# Patient Record
Sex: Female | Born: 1965 | Race: White | Hispanic: No | Marital: Married | Smoking: Never smoker
Health system: Southern US, Community
[De-identification: ages and names within clinical notes are randomized; demographics above are authoritative.]

## PROBLEM LIST (undated history)

## (undated) DIAGNOSIS — F329 Major depressive disorder, single episode, unspecified: Secondary | ICD-10-CM

## (undated) DIAGNOSIS — F429 Obsessive-compulsive disorder, unspecified: Secondary | ICD-10-CM

## (undated) DIAGNOSIS — R519 Headache, unspecified: Secondary | ICD-10-CM

## (undated) DIAGNOSIS — N814 Uterovaginal prolapse, unspecified: Secondary | ICD-10-CM

## (undated) DIAGNOSIS — IMO0002 Reserved for concepts with insufficient information to code with codable children: Secondary | ICD-10-CM

## (undated) DIAGNOSIS — M810 Age-related osteoporosis without current pathological fracture: Secondary | ICD-10-CM

## (undated) DIAGNOSIS — G809 Cerebral palsy, unspecified: Secondary | ICD-10-CM

## (undated) DIAGNOSIS — G709 Myoneural disorder, unspecified: Secondary | ICD-10-CM

## (undated) DIAGNOSIS — F32A Depression, unspecified: Secondary | ICD-10-CM

## (undated) DIAGNOSIS — F419 Anxiety disorder, unspecified: Secondary | ICD-10-CM

## (undated) DIAGNOSIS — R51 Headache: Secondary | ICD-10-CM

## (undated) DIAGNOSIS — C801 Malignant (primary) neoplasm, unspecified: Secondary | ICD-10-CM

## (undated) HISTORY — PX: FRACTURE SURGERY: SHX138

## (undated) HISTORY — DX: Reserved for concepts with insufficient information to code with codable children: IMO0002

## (undated) HISTORY — DX: Obsessive-compulsive disorder, unspecified: F42.9

## (undated) HISTORY — DX: Anxiety disorder, unspecified: F41.9

## (undated) HISTORY — PX: ELBOW SURGERY: SHX618

## (undated) HISTORY — DX: Age-related osteoporosis without current pathological fracture: M81.0

## (undated) HISTORY — DX: Uterovaginal prolapse, unspecified: N81.4

## (undated) HISTORY — DX: Cerebral palsy, unspecified: G80.9

---

## 2003-10-18 HISTORY — PX: WRIST SURGERY: SHX841

## 2004-03-15 ENCOUNTER — Emergency Department (HOSPITAL_COMMUNITY): Admission: EM | Admit: 2004-03-15 | Discharge: 2004-03-16 | Payer: Self-pay | Admitting: Emergency Medicine

## 2004-03-17 ENCOUNTER — Ambulatory Visit (HOSPITAL_BASED_OUTPATIENT_CLINIC_OR_DEPARTMENT_OTHER): Admission: RE | Admit: 2004-03-17 | Discharge: 2004-03-17 | Payer: Self-pay | Admitting: Orthopedic Surgery

## 2005-06-25 ENCOUNTER — Emergency Department (HOSPITAL_COMMUNITY): Admission: EM | Admit: 2005-06-25 | Discharge: 2005-06-25 | Payer: Self-pay | Admitting: Family Medicine

## 2007-01-30 ENCOUNTER — Ambulatory Visit (HOSPITAL_COMMUNITY): Admission: RE | Admit: 2007-01-30 | Discharge: 2007-01-30 | Payer: Self-pay | Admitting: Obstetrics and Gynecology

## 2007-04-09 ENCOUNTER — Inpatient Hospital Stay (HOSPITAL_COMMUNITY): Admission: AD | Admit: 2007-04-09 | Discharge: 2007-04-09 | Payer: Self-pay | Admitting: Obstetrics and Gynecology

## 2007-04-09 ENCOUNTER — Encounter: Payer: Self-pay | Admitting: Emergency Medicine

## 2007-04-18 ENCOUNTER — Inpatient Hospital Stay (HOSPITAL_COMMUNITY): Admission: AD | Admit: 2007-04-18 | Discharge: 2007-04-18 | Payer: Self-pay | Admitting: Obstetrics and Gynecology

## 2007-06-22 ENCOUNTER — Encounter (INDEPENDENT_AMBULATORY_CARE_PROVIDER_SITE_OTHER): Payer: Self-pay | Admitting: Obstetrics and Gynecology

## 2007-06-22 ENCOUNTER — Inpatient Hospital Stay (HOSPITAL_COMMUNITY): Admission: RE | Admit: 2007-06-22 | Discharge: 2007-06-26 | Payer: Self-pay | Admitting: Obstetrics and Gynecology

## 2009-10-17 DIAGNOSIS — IMO0002 Reserved for concepts with insufficient information to code with codable children: Secondary | ICD-10-CM

## 2009-10-17 DIAGNOSIS — R87619 Unspecified abnormal cytological findings in specimens from cervix uteri: Secondary | ICD-10-CM

## 2009-10-17 HISTORY — PX: COLPOSCOPY: SHX161

## 2009-10-17 HISTORY — DX: Reserved for concepts with insufficient information to code with codable children: IMO0002

## 2009-10-17 HISTORY — DX: Unspecified abnormal cytological findings in specimens from cervix uteri: R87.619

## 2010-03-04 ENCOUNTER — Encounter: Admission: RE | Admit: 2010-03-04 | Discharge: 2010-03-04 | Payer: Self-pay | Admitting: Allergy and Immunology

## 2010-11-06 ENCOUNTER — Encounter: Payer: Self-pay | Admitting: Family Medicine

## 2011-03-01 NOTE — H&P (Signed)
NAMEFRONNIE, URTON NO.:  [id number]   MEDICAL RECORD NO.:  [id number]          PATIENT TYPE:  INP   LOCATION:  9199                          FACILITY:  [facility]   PHYSICIAN:  Osborn Coho, M.D.   DATE OF BIRTH:  29-Nov-1965   DATE OF ADMISSION:  06/22/2007  DATE OF DISCHARGE:                              HISTORY & PHYSICAL   This is a 45 year old gravida 1, para 0 at 39-0/7 weeks who presents for  elective C-section secondary to musculoskeletal pain related to a broken  ankle.  She denies any leaking or bleeding and reports positive fetal  movement.  Pregnancy has been followed by the Physician's Service and  remarkable for:  1. AMA.  2. OCD with anxiety and depression.  3. Cerebral palsy on the right arm and leg.  4. Obesity.  5. Rh negative.   ALLERGIES:  None.   OBSTETRICAL HISTORY:  Patient is primigravida.   MEDICAL HISTORY:  Remarkable for childhood varicella, history of  migraines, history of depression, anxiety, OCD, and right-sided cerebral  palsy which causes frequent falls and recent fracture of her right  ankle.   PAST SURGICAL HISTORY:  Remarkable for a broken left wrist.   FAMILY HISTORY:  Remarkable for a mother with varicosities.  Grandmother  and grandfather with Alzheimer's, father with skin cancer.  Mother and  brother with psychiatric disorders.   GENETIC HISTORY:  Remarkable for patient with cerebral palsy on the  right side and father of the baby with a misformed aortic valve and  patient's age of 60.   SOCIAL HISTORY:  The patient is married to [employer] who is involved  and supportive.  She is of the [place] faith.  She works as a  Museum/gallery exhibitions officer, and she denies any alcohol, tobacco or drug use, although  she is on Luvox, Wellbutrin, and Vistaril for her mental health issues.   LABORATORY DATA:  Hemoglobin 13.2, platelets 323, blood type 0 negative.  Antibody screen negative, RPR nonreactive, rubella immune, hepatitis  negative, Pap test negative, gonorrhea and chlamydia negative.  Cystic  fibrosis negative.   HISTORY OF CURRENT PREGNANCY:  Patient entered care at 31 weeks'  gestation.  First trimester screen was normal.  She had an ultrasound at  16 weeks that was normal and echocardiogram at 20 weeks that was normal.  She restarted Wellbutrin at 20 weeks and added Luvox and Vistaril to  that at 24 weeks per [name].  She was treated for a UTI at 28 weeks.  Hemoglobin was 8.8 at 29 weeks, and she was placed on iron three times a  day.  Glucola was elevated, and she had a three hour GPT.  Results are  unavailable.  She broke her ankle at 29 weeks and was treated by  orthopedics for that.  She was retreated for a UTI at 31 weeks.  She  presents today for C-section.   PHYSICAL EXAMINATION:  HEENT:  Within normal limits.  NECK:  Thyroid not enlarged.  CHEST:  Clear to auscultation.  HEART:  Heart rate regular rate and rhythm.  ABDOMEN:  Gravid.  Fetal heart tones 150.  PELVIC:  Deferred.  EXTREMITIES:  Within normal limits except for pain in the right ankle  and cerebral palsy changes in the right leg.   ASSESSMENT:  1. Intrauterine pregnancy at 39-0/7 weeks.  2. Musculoskeletal issues.  3. Elective primary C-section.   PLAN:  Admit to OR per Dr. Su Hilt and further orders to follow.      Marie L. Williams, C.N.M.      Osborn Coho, M.D.  Electronically Signed    MLW/MEDQ  D:  06/22/2007  T:  06/22/2007  Job:  [id number]

## 2011-03-01 NOTE — Op Note (Signed)
NAMEJANESIA, Casey Diaz NO.:  [id number]   MEDICAL RECORD NO.:  [id number]          PATIENT TYPE:  INP   LOCATION:  9131                          [facility]ACILITY:  [facility]   PHYSICIAN:  Osborn Coho, M.D.   DATE OF BIRTH:  02/19/1966   DATE OF PROCEDURE:  06/22/2007  DATE OF DISCHARGE:                               OPERATIVE REPORT   PREOPERATIVE DIAGNOSES:  1. Thirty-nine weeks pregnant.  2. Breech presentation.  3. Osteoporosis.   POSTOPERATIVE DIAGNOSES:  1. Thirty-nine weeks pregnant.  2. Breech presentation.  3. Osteoporosis.   PROCEDURE:  Primary low transverse C-section.   ATTENDING:  Osborn Coho, M.D.   ASSISTANT:  Wynelle Bourgeois, C.N.M.   ANESTHESIA:  Epidural.   FINDINGS:  Live female infant, [name].  Apgars 5 at one minute and 7 at  five minutes.  Cord gases:  Venous pH 7.35 and arterial pH 7.36.  Fluids  1450 mL.   URINE OUTPUT:  200 mL.   ESTIMATED BLOOD LOSS:  1000 mL.  Placenta to pathology.   COMPLICATIONS:  None.   PROCEDURE:  The patient was taken to the operating room after the risks,  benefits and alternatives were reviewed with the patient and the patient  verbalized understanding and consent signed and witnessed.  The patient  was given an epidural and prepped and draped in the normal sterile  fashion in the supine position.  A Pfannenstiel skin incision was made,  and the incision was carried down to the underlying layer of fascia with  the Bovie.  The fascia was excised bilaterally in the midline and  extended bilaterally with the Mayo scissors.  Kocher clamps were placed  on the inferior aspect of the fascial incision and the rectus muscle  excised from the fascia.  The same was done on the superior aspect of  the fascial incision.  The rectus muscle was separated in the midline  and the peritoneum entered bluntly and extended manually.  Bladder blade  was placed and bladder flap created with the Metzenbaum scissors.  Uterine  incision was made with the scalpel and extended bilaterally with  the bandage scissors.  Infant was delivered in breech presentation and  cord clamped and cut, and the infant handed to the waiting  pediatricians.  The placenta was removed via fundal massage, and the  uterus was cleared of all clots and debris.  The uterine incision was  repaired with 0 Vicryl in a running locked fashion, and a second  imbricating layer was performed.  Copious irrigation of the intra-  abdominal cavity was performed, and bilateral ovaries and fallopian  tubes appeared to be within normal limits.  The peritoneum was repaired  with 2-0 chromic in a running fashion.  The fascia was repaired with 0  Vicryl in a running fashion.  The subcutaneous tissue was irrigated and  made hemostatic with the Bovie.  Two-0 plain was used in 3 interrupted  stitches to reapproximate the subcutaneous tissue.  The skin was  repaired with 3-0 Monocryl via a subcuticular stitch.  Steri-Strips were  applied.  Sponge, lap  and needle count was correct.  The patient  tolerated the procedure well and was awaiting transfer to the recovery  room in good condition.      Osborn Coho, M.D.  Electronically Signed     AR/MEDQ  D:  06/22/2007  T:  06/22/2007  Job:  [id number]

## 2011-03-01 NOTE — Discharge Summary (Signed)
NAMECHRISTIANE, SISTARE NO.:  [id number]   MEDICAL RECORD NO.:  [id number]          PATIENT TYPE:  INP   LOCATION:  9131                          [facility]ACILITY:  [facility]   PHYSICIAN:  Janine Limbo, M.D.DATE OF BIRTH:  01-26-1966   DATE OF ADMISSION:  06/22/2007  DATE OF DISCHARGE:  06/26/2007                               DISCHARGE SUMMARY   ADMITTING DIAGNOSES:  1. Intrauterine pregnancy at 39 weeks.  2. Scheduled for elective cesarean section secondary to      musculoskeletal pain related to broken ankle.  3. Obsessive compulsive disorder with anxiety and depression.  4. Cerebral palsy on her right arm and leg.  5. Advanced maternal age.   DISCHARGE DIAGNOSES:  1. Thirty-nine weeks pregnant.  2. Breech presentation.  3. Osteoporosis.  4. Musculoskeletal limitations.   PROCEDURES:  Primary low transverse cesarean section.   HOSPITAL COURSE:  Ms. Ibrahim is a 45 year old gravida 1, para 0, at 43  weeks, who presented for elective cesarean section secondary to  musculoskeletal pain related to a broken ankle and osteoporosis of the  right hip as well as breech presentation.  Pregnancy has been remarkable  for (1) Advanced maternal age.  (2) OCD with anxiety and depression.  (3) Cerebral palsy of the right arm and leg.  (4) Obesity.  (5) Rh  negative.  (6) Chronic pain for approximately 3 weeks secondary to  sequelae of a broken ankle on the right-hand side leading to left hip  osteoporosis and fluid.  The patient was taken to the operating room  where a primary low transverse cesarean section was performed by Dr.  Su Hilt.  Findings were a viable female by the name of [name].  Apgars were  5 and 7.  Cord gases were within normal limits.  The patient tolerated  the procedure well and was taken to recovery in good condition.  The  infant was taken initially to the full-term nursery but then was  transferred to NICU secondary to blood glucose instability.  The  patient, by postop day 1, was still on epidural anesthesia.  This is  controlling her pain at present; however, when she was switched to p.o.  pain medication, she did have significant issues with comfort.  Dilaudid  was begun p.o.; Percocet was discontinued.  Eventually she was placed on  a 6 mg dose, and this was found to be adequate.  She also was placed on  Robaxin for muscle spasm.  She also was eventually placed on clonazepam  for sleep and Ativan for anxiety and hypersensitivity to stimulation.  This demonstrated itself to be a good regimen.  PT consult was obtained,  and the recommendation was made for home PT and OT therapy.  Throughout  the rest of the hospital stay, the patient's healing progressed very  well. For her incision, she did have Steri-Strips and subcuticular  sutures noted.  There was negative Homans sign in both legs.  She was  having pain, though in her right hip which did limit her mobility.  The  patient did, on postop day 3, have  a fall from the bedside commode.  There was no exacerbation of her injury.  Social work consult was also  obtained for history of anxiety and depression.  By postop day 4, the  patient was more improved.  Physical therapy had just come in and made  the recommendation for home care.  They felt she was stable to be  discharged.  Her physical exam was within normal limits.  Her incision  was clean, dry, and intact.  She was doing well with her pain medication  regimen and other medications.  Dr. Stefano Gaul saw the patient, and she  was deemed to have received full benefit of her hospital stay and was  discharged home.   DISCHARGE INSTRUCTIONS:  Per [facility] handout.  PT and OT  therapy will also begin at home.   DISCHARGE MEDICATIONS:  1. Robaxin 100 mg p.o. q.i.d.  2. Temazepam 7.5 mg 1 p.o. q.h.s.  3. Lorazepam 1 mg p.o. t.i.d. p.r.n.  4. Dilaudid 6 mg p.o. q.4h. p.r.n. pain.  5. Motrin 600 mg p.o. q.6h. p.r.n.  6. The  patient is to continue her previously utilized medications of      Luvox, Wellbutrin, Flexeril if needed Xyzal and Nasalcrom.  These      have been previously prescribed by other physicians.   DISCHARGE FOLLOWUP:  1. PT and OT will see the patient as soon as that can be arranged      through [facility].  2. The patient will follow up with Dr. Haywood Lasso for management of her      anxiety and psychotropic medications.  3. The patient currently has a 5 week followup with her orthopedist,      but this will be the managers of her pain medication beyond the      postpartum time.  4. Discharge followup with [facility] will be at 6 weeks.      Renaldo Reel Emilee Hero, C.N.M.      Janine Limbo, M.D.  Electronically Signed    VLL/MEDQ  D:  06/26/2007  T:  06/26/2007  Job:  [id number]

## 2011-03-04 NOTE — Consult Note (Signed)
[name], Casey Diaz                            ACCOUNT NO.:  [id number]   MEDICAL RECORD NO.:  [id number]                   PATIENT TYPE:  EMS   LOCATION:  ED                                   [facility]ACILITY:  [facility]   PHYSICIAN:  Artist Pais. Mina Marble, M.D.           DATE OF BIRTH:  08/24/1966   DATE OF CONSULTATION:  03/15/2004  DATE OF DISCHARGE:                                   CONSULTATION   EMERGENCY ROOM CONSULTATION:   PHYSICIAN REQUESTING CONSULTATION:  Donnetta Hutching, M.D.   REASON FOR CONSULTATION:  Casey Diaz is a 45 year old left-hand dominant  female who fell down on her outstretched left hand earlier this evening. She  presents today with pain and deformity. She is an otherwise fairly healthy,  45 year old. She is left-hand dominant. She has cerebral palsy effecting her  right side. She is hemiplegic.   ALLERGIES:  She has no known drug allergies.   MEDICATIONS PRIOR TO ADMISSION:  She is currently taking Wellbutrin, birth  control pills, and temazepam.   PAST MEDICAL HISTORY:  No recent hospitalization or surgery.   FAMILY HISTORY:  Noncontributory.   SOCIAL HISTORY:  Noncontributory.   PHYSICAL EXAMINATION:  Today, a well-developed, well-nourished female,  pleasant, alert, __________.  EXAMINATION OF HER UPPER EXTREMITY:  On the left, she has obvious pain and  deformity and swelling. She can move her digits, intermittent numbness and  tingling in the median distribution. Skin is intact and no open injuries.  She has no pain in the shoulder or elbow.   X-RAYS:  Show a displaced fracture of the distal radius.   IMPRESSION:  We have a 45 year old female with a displaced distal radius  fracture on her dominant left side.   She was given 2% plain lidocaine, hematoma block, placed in the fingertrap  traction, closed reduction was performed. She was placed in a sugar tong  splint. Postoperative films showed good reduction in the AP plane but  continued dorsal angulation  on the lateral plane. She was discharged from  the emergency department to followup in my office tomorrow to be scheduled  for surgery. She was given Percocet for pain, instructions on compartment  syndrome, acute carpal tunnel, etc. To call my office immediately if any of  those symptoms arise. If not, will check her tomorrow at 2 o'clock on  03/16/2004.                                              Artist Pais Mina Marble, M.D.   MAW/MEDQ  D:  03/16/2004  T:  03/16/2004  Job:  [id number]   cc:   Donnetta Hutching, MD

## 2011-03-04 NOTE — Op Note (Signed)
Casey Diaz, Casey Diaz                            ACCOUNT NO.:  [id number]   MEDICAL RECORD NO.:  [id number]                   PATIENT TYPE:  AMB   LOCATION:  DSC                                  FACILITY:  [facility]   PHYSICIAN:  Matthew A. Mina Marble, M.D.           DATE OF BIRTH:  1966/08/24   DATE OF PROCEDURE:  03/17/2004  DATE OF DISCHARGE:                                 OPERATIVE REPORT   PREOPERATIVE DIAGNOSIS:  Displaced intra-articular fracture, distal radius,  left.   POSTOPERATIVE DIAGNOSIS:  Displaced intra-articular fracture, distal radius,  left.   PROCEDURE:  Open reduction and internal fixation of displaced intra-  articular fracture, distal radius, left, using DVR-A anatomic plate and  screws.   SURGEON:  Artist Pais. Mina Marble, M.D.   ASSISTANT:  Aura Fey. Bobbe Medico.   ANESTHESIA:  General.   TOURNIQUET TIME:  38 minutes.   No complication, no drains.   OPERATIVE REPORT:  The patient was taken to the operating room and after the  induction of adequate general anesthesia, the left upper extremity was  prepped and draped in the usual sterile fashion.  An Esmarch was used to  exsanguinate the limb.  The tourniquet was inflated to 250 mmHg.  At this  point in time a longitudinal incision was made over the palmar aspect of the  distal forearm and wrist area over the palpable border of the flexor carpi  radialis tendon.  The incision was taken down through the skin and  subcutaneous tissues.  The FCR tendon was identified, the sheath was  incised.  The FCR tendon was retracted to the midline, and the radial artery  was retracted to the lateral side.  This interval was developed.  Dissection  was carried down to the level of the pronator quadratus.  The pronator  quadratus was subperiosteally stripped off the distal radius at the fracture  site.  The fracture site was debrided of clot and open reduction was  performed.  The DVR plate was then fastened to the volar aspect of  the  distal radius using one single cortical screw in the slotted part of the  plate.  X-ray showed good reduction in both the AP, lateral, and oblique  view.  The remaining cortical screws were filled, followed by the distal  pegs, under direct and fluoroscopic vision.  Intraoperative x-ray showed  good reduction in both the AP and lateral plane.  The wound was thoroughly  irrigated.  It was then loosely closed in layers with 3-0 Vicryl on the  pronator quadratus repair and a 3-0 Prolene subcuticular stitch on the skin.  Steri-Strips, 4 x 4's, fluffs, and a volar splint were applied.  The patient  tolerated the procedure well and went to the recovery room in stable  fashion.  Artist Pais Mina Marble, M.D.    MAW/MEDQ  D:  03/17/2004  T:  03/17/2004  Job:  [id number]

## 2011-07-29 LAB — CBC
HCT: 31.8 — ABNORMAL LOW
MCHC: 34.8
MCV: 89.4
Platelets: 279
RBC: 4.16
RDW: 15.7 — ABNORMAL HIGH
RDW: 16 — ABNORMAL HIGH

## 2011-07-29 LAB — CCBB MATERNAL DONOR DRAW

## 2011-07-29 LAB — RPR: RPR Ser Ql: NONREACTIVE

## 2011-07-29 LAB — RH IMMUNE GLOB WKUP(>/=20WKS)(NOT WOMEN'S HOSP)

## 2011-08-02 LAB — RH IMMUNE GLOBULIN WORKUP (NOT WOMEN'S HOSP): Antibody Screen: NEGATIVE

## 2011-10-18 HISTORY — PX: HAMMER TOE SURGERY: SHX385

## 2012-03-30 ENCOUNTER — Telehealth: Payer: Self-pay | Admitting: Obstetrics and Gynecology

## 2012-03-30 NOTE — Telephone Encounter (Signed)
Spoke with pt rgd msg. Pt stated she thinks she might have a yeast infection . Advised pt to try toc meds. Made an app with vicki on 04/05/12 @ 9:45. Pt's voice understanding . BT CMA

## 2012-03-30 NOTE — Telephone Encounter (Signed)
Triage/cht received 

## 2012-04-05 ENCOUNTER — Encounter: Payer: Self-pay | Admitting: Obstetrics and Gynecology

## 2012-06-03 ENCOUNTER — Other Ambulatory Visit: Payer: Self-pay | Admitting: Obstetrics and Gynecology

## 2012-07-10 ENCOUNTER — Telehealth: Payer: Self-pay

## 2012-07-10 NOTE — Telephone Encounter (Signed)
Called [facility] pharmacy to let them know that they can replace LoEstrin 24 FE to Minastrin 24 FE. Per protocol. JO, CMA

## 2012-07-27 ENCOUNTER — Encounter: Payer: Self-pay | Admitting: Obstetrics and Gynecology

## 2012-07-27 ENCOUNTER — Ambulatory Visit (INDEPENDENT_AMBULATORY_CARE_PROVIDER_SITE_OTHER): Payer: 59 | Admitting: Obstetrics and Gynecology

## 2012-07-27 VITALS — BP 114/80 | HR 82 | Ht 67.0 in | Wt 216.0 lb

## 2012-07-27 DIAGNOSIS — Z01419 Encounter for gynecological examination (general) (routine) without abnormal findings: Secondary | ICD-10-CM

## 2012-07-27 DIAGNOSIS — Z124 Encounter for screening for malignant neoplasm of cervix: Secondary | ICD-10-CM

## 2012-07-27 DIAGNOSIS — R350 Frequency of micturition: Secondary | ICD-10-CM

## 2012-07-27 LAB — POCT URINALYSIS DIPSTICK
Leukocytes, UA: NEGATIVE
Nitrite, UA: NEGATIVE
Protein, UA: NEGATIVE
pH, UA: 5

## 2012-07-27 MED ORDER — NORETHIN ACE-ETH ESTRAD-FE 1-20 MG-MCG(24) PO TABS: 1.0000 | ORAL_TABLET | Freq: Every day | ORAL | Status: DC

## 2012-07-27 MED ORDER — NORETHIN ACE-ETH ESTRAD-FE 1-20 MG-MCG(24) PO CHEW: 1.0000 | CHEWABLE_TABLET | Freq: Every day | ORAL | Status: DC

## 2012-07-27 NOTE — Progress Notes (Signed)
Regular Periods: no Mammogram: yes  Monthly Breast Ex.: yes Exercise: yes  Tetanus < 10 years: yes Seatbelts: yes  NI. Bladder Functn.: yes Abuse at home: no  Daily BM's: yes Stressful Work: yes  Healthy Diet: yes Sigmoid-Colonoscopy: NO  Calcium: no Medical problems this year: FREQUENT URINATION    LAST PAP:8/12  NL  Contraception: LOESTRIN 24   Mammogram:  10/13  NL  PCP: NO  PMH: NO CHANGE  FMH: NO CHANGE  Last Bone Scan: NO  PT IS MARRIED

## 2012-07-27 NOTE — Progress Notes (Signed)
Subjective:    Casey Diaz is a 46 y.o. female, G1P1, who presents for an annual exam. The patient reports urinary frequency but no dysuria, fever, or flank pain. Goes on to report occasional chills/hot flashes and more anxiety.  Denies sleep issues, suicidal or homicidal ideations.  Admits to extended family issues and states that her father is dying from lung cancer.  Menstrual cycle:   LMP: Patient's last menstrual period was 07/27/2012.             Review of Systems Pertinent items are noted in HPI. Denies pelvic pain, urinary tract symptoms, vaginitis symptoms, irregular bleeding, menopausal symptoms, change in bowel habits or rectal bleeding   Objective:    BP 114/80  Pulse 82  Ht 5\' 7"  (1.702 m)  Wt 216 lb (97.977 kg)  BMI 33.83 kg/m2  LMP 07/27/2012   Wt Readings from Last 1 Encounters:  07/27/12 216 lb (97.977 kg)   Body mass index is 33.83 kg/(m^2). General Appearance: Alert, no acute distress HEENT: Grossly normal Neck / Thyroid: Supple, no thyromegaly or cervical adenopathy Lungs: Clear to auscultation bilaterally Back: No CVA tenderness Breast Exam: No masses or nodes.No dimpling, nipple retraction or discharge. Cardiovascular: Regular rate and rhythm.  Gastrointestinal: Soft, non-tender, no masses or organomegaly Pelvic Exam: EGBUS-wnl, vagina-normal rugae, cervix- without lesions or tenderness, uterus appears normal size shape and consistency, adnexae-no masses or tenderness Rectovaginal: no masses and normal sphincter tone Lymphatic Exam: Non-palpable nodes in neck, clavicular,  axillary, or inguinal regions  Skin: no rashes or abnormalities Extremities: no clubbing cyanosis or edema  Neurologic: grossly normal Psychiatric: Alert and oriented  Urinalysis: negative    Assessment:   Routine GYN Exam Personal Stressors   Plan:  Continue Loestrin 24/Minastrin24  #1  1po qd 11 refills  PAP sent  RTO 1 year or prn  Charlyn Vialpando,ELMIRAPA-C

## 2012-07-30 LAB — PAP IG W/ RFLX HPV ASCU

## 2012-08-07 ENCOUNTER — Telehealth: Payer: Self-pay | Admitting: Obstetrics and Gynecology

## 2012-08-08 ENCOUNTER — Other Ambulatory Visit: Payer: Self-pay | Admitting: Obstetrics and Gynecology

## 2012-08-08 MED ORDER — HYDROCORTISONE 2.5 % RE CREA: TOPICAL_CREAM | Freq: Two times a day (BID) | RECTAL | Status: AC

## 2012-08-08 NOTE — Telephone Encounter (Signed)
Pt wants rx for hemorrhoids

## 2012-08-13 ENCOUNTER — Telehealth: Payer: Self-pay | Admitting: Obstetrics and Gynecology

## 2012-08-13 NOTE — Telephone Encounter (Signed)
VM FROM PT 08/10/12 08/10/12.  States having problem with hemorrhoids and wants RX. Called a few days ago.  Per chart, Rx E-scribed 08/08/12. TC to pt. LM to return call.

## 2012-08-15 NOTE — Telephone Encounter (Signed)
Encounter complete. 

## 2012-11-22 ENCOUNTER — Other Ambulatory Visit: Payer: Self-pay | Admitting: Obstetrics and Gynecology

## 2012-12-20 ENCOUNTER — Other Ambulatory Visit: Payer: Self-pay | Admitting: Obstetrics and Gynecology

## 2013-10-17 HISTORY — PX: ELBOW SURGERY: SHX618

## 2014-04-10 ENCOUNTER — Other Ambulatory Visit: Payer: Self-pay | Admitting: Internal Medicine

## 2014-04-10 DIAGNOSIS — R319 Hematuria, unspecified: Secondary | ICD-10-CM

## 2014-04-14 ENCOUNTER — Other Ambulatory Visit: Payer: Self-pay

## 2014-04-16 ENCOUNTER — Other Ambulatory Visit: Payer: Self-pay | Admitting: Internal Medicine

## 2014-04-16 ENCOUNTER — Inpatient Hospital Stay: Admission: RE | Admit: 2014-04-16 | Payer: Self-pay | Source: Ambulatory Visit

## 2014-04-16 ENCOUNTER — Encounter (INDEPENDENT_AMBULATORY_CARE_PROVIDER_SITE_OTHER): Payer: Self-pay

## 2014-04-16 ENCOUNTER — Ambulatory Visit
Admission: RE | Admit: 2014-04-16 | Discharge: 2014-04-16 | Disposition: A | Discharge status: Home / Self Care | Payer: 59 | Source: Ambulatory Visit | Attending: Internal Medicine | Admitting: Internal Medicine

## 2014-04-16 DIAGNOSIS — R319 Hematuria, unspecified: Secondary | ICD-10-CM

## 2014-08-18 ENCOUNTER — Encounter: Payer: Self-pay | Admitting: Obstetrics and Gynecology

## 2015-04-03 ENCOUNTER — Telehealth: Payer: Self-pay | Admitting: *Deleted

## 2015-04-03 NOTE — Telephone Encounter (Signed)
Pt states she ordered a lift for her hammer toes from Dr. Stacie Acres 2 weeks ago, and is checking status of order.

## 2015-04-17 ENCOUNTER — Ambulatory Visit (INDEPENDENT_AMBULATORY_CARE_PROVIDER_SITE_OTHER): Payer: 59 | Admitting: Podiatry

## 2015-04-17 VITALS — BP 107/72 | HR 89

## 2015-04-17 DIAGNOSIS — M205X1 Other deformities of toe(s) (acquired), right foot: Secondary | ICD-10-CM

## 2015-04-17 DIAGNOSIS — M2041 Other hammer toe(s) (acquired), right foot: Secondary | ICD-10-CM

## 2015-04-17 NOTE — Progress Notes (Signed)
Patient ID: Casey Diaz, female   DOB: April 25, 1966, 49 y.o.   MRN: [id number] I want to discuss the pain I am still having in my toes   This patient returns to my office having pain in the tips of her third and fourth toes right foot.  She is greatly concerned about her pain.  She previoiusly haD SURGERY PERFORMED BY MYSELF FOR 2,3,4 FUSIONS WITH k-WIRES.  She has ms on her left side and her pins did not stay in due to her poor bone stock.  She has now developed pain in her toes.  She was treated with crest pad which helps but her pain remains.  She desires permanent correction of this problem.Objective: Review of past medical history, medications, social history and allergies were performed.  Vascular: Dorsalis pedis and posterior tibial pulses were palpable B/L, capillary refill was  WNL B/L, temperature gradient was WNL B Skin  Distal clavi third toe right foot Nails: appear healthy with no signs of mycosis or infections  Sensory: Semmes Weinstein monifilament WNL   Orthopedic: Orthopedic evaluation demonstrates all joints distal t ankle have full ROM without crepitus, muscle power WNL B/L.  Mallet toe deformity 3,4 right foot  A.  Mallet toe 3,4 right foot  P.  ROV.  Discussed surgery with patient and she desires correction for 3,4 toes right foot.  To refer to Dr. Charlsie Merlesegal hoping he can perform in office surgery.

## 2015-04-23 ENCOUNTER — Telehealth: Payer: Self-pay | Admitting: *Deleted

## 2015-04-23 ENCOUNTER — Encounter: Payer: Self-pay | Admitting: Podiatry

## 2015-04-23 ENCOUNTER — Ambulatory Visit (INDEPENDENT_AMBULATORY_CARE_PROVIDER_SITE_OTHER): Payer: 59 | Admitting: Podiatry

## 2015-04-23 VITALS — BP 100/68 | HR 85 | Resp 15

## 2015-04-23 DIAGNOSIS — M2041 Other hammer toe(s) (acquired), right foot: Secondary | ICD-10-CM

## 2015-04-23 DIAGNOSIS — M205X1 Other deformities of toe(s) (acquired), right foot: Secondary | ICD-10-CM

## 2015-04-23 NOTE — Progress Notes (Signed)
Subjective:     Patient ID: Casey GriffithsJoy E Panuco, female   DOB: [birth year], 49 y.o.   MRN: [id number]  HPI patient presents stating I have had previous digital surgery on my right foot and I have a history of cerebral palsy and I did distal contraction of my toes with callus formation. Also my foot in general gets tired and painful and I wanted to know if a brace would be appropriate for me   Review of Systems     Objective:   Physical Exam Neurovascular status intact muscle strength adequate with distal contracture of the third fourth and second toes right with keratotic lesion mostly on the third and fourth toe and slightly on the second toe. Painful when pressed with also some depression of the arch secondary to see P deformity    Assessment:     Hammertoe deformity of the second third and fourth toes right with distal contracture and foot structural issues leading to tendinitis-like symptoms    Plan:     Reviewed conditions and discussed treatment options. Patient wants to have these toes fixed and is spoken to Dr. Stacie AcresMayer concerning the procedures and at this point distal arthroplasty digits 234 of the right foot have been recommended. This will be done under local and aesthetic and she does not want any form of sedation and they will be done at the surgical center. Patient at this time was given consent form for digital procedures digits 234 and I explained alternative treatments and complications associated with these procedures. Patient wants surgery signed consent form after extensive review and is scheduled for outpatient surgery and I also discussed brace therapy which can be done post operatively when the toes have healed. Patient will call to schedule surgery and was given preoperative instructions

## 2015-04-23 NOTE — Telephone Encounter (Signed)
"  I saw Dr. Charlsie Merlesegal this morning.  I want to schedule surgery for Tuesday of next week."  We may not be able to get it authorized by Tuesday.  [facility]  I will attempt to get it.  If I can't, I'll give you a call.  "Will it help if I called insurance company?  I'm having a lot of pain.  I was a patient of Dr. Stacie AcresMayer before and he had been treating me for the same problem."  No, I don't think it's needed.  I'll let you know.

## 2015-04-27 NOTE — Telephone Encounter (Signed)
"  I'm calling to see if you have heard anything regarding my insurance authorization for surgery."  I checked this morning, it's still pending.  I was going to check again this afternoon and give you a call.  "If I can't do it tomorrow, what's the next available date?"  I can try and get it rescheduled to 08/03 at lunch time.  "Okay that sounds good.  Will you let me know either way?"  Yes, I will let you know.

## 2015-04-27 NOTE — Telephone Encounter (Signed)
I'm calling to let you know it's still pending with insurance.  I can check again first thing in the morning if you like.  They have you scheduled as the last case.  "That will be fine.  If not, it will be scheduled for August 3, correct?"  Yes, it will be scheduled for August 3.

## 2015-04-28 NOTE — Telephone Encounter (Signed)
I left patient a message that I was not able to get authorization for surgery.  It is still pending.  I have been waiting on hold for 1 hour.  They're going to e-mail me a response when it's authorized.  It looks like we may have to reschedule to August 3.  Dr. Charlsie Merlesegal doesn't have anything available on 08/02.  Call if you have any questions or concerns.

## 2015-04-28 NOTE — Telephone Encounter (Signed)
I got authorization for patient's surgery scheduled for 05/20/2015.  Authorization number is [id number].  [facility]    I'm calling to inform you that surgery was authorized.  "So I'm good for 05/20/2015?"  Yes, you are good for that date.  "What time am I supposed to be there?"  It is a lunch time case.  Surgical center will call you a day or two prior to date and give you arrival time.  Tentative time is 11am.

## 2015-05-04 ENCOUNTER — Other Ambulatory Visit: Payer: Self-pay

## 2015-05-05 ENCOUNTER — Encounter: Payer: Self-pay | Admitting: Podiatry

## 2015-05-12 ENCOUNTER — Encounter: Payer: Self-pay | Admitting: Podiatry

## 2015-05-18 ENCOUNTER — Telehealth: Payer: Self-pay | Admitting: *Deleted

## 2015-05-18 NOTE — Telephone Encounter (Signed)
Per Dr. Charlsie Merles, I called to see if patient could reschedule her surgery from 05/20/2015 to 05/19/2015.  "I cannot move it.  I already have things lined up to do tomorrow that can't be moved."  Okay that is fine.  "Will the surgical center call me tomorrow and what time will my surgery be.?"  Yes, they should give you a call tomorrow.  Surgery will be around lunch time.  They will tell you what time to arrive.  "Okay, thanks for calling."

## 2015-05-20 ENCOUNTER — Encounter: Payer: Self-pay | Admitting: Podiatry

## 2015-05-20 DIAGNOSIS — M2041 Other hammer toe(s) (acquired), right foot: Secondary | ICD-10-CM | POA: Diagnosis not present

## 2015-05-22 ENCOUNTER — Telehealth: Payer: Self-pay | Admitting: *Deleted

## 2015-05-22 MED ORDER — DIAZEPAM 5 MG PO TABS: 5.0000 mg | ORAL_TABLET | Freq: Three times a day (TID) | ORAL | Status: DC | PRN

## 2015-05-22 MED ORDER — CYCLOBENZAPRINE HCL 10 MG PO TABS: 10.0000 mg | ORAL_TABLET | Freq: Three times a day (TID) | ORAL | Status: DC | PRN

## 2015-05-22 NOTE — Telephone Encounter (Signed)
Pt states she is in severe pain, and stiffens up when walking.  I informed pt the pain is not unusual at this point post-op and should decrease from this point.  I instructed pt to remove ace only and elevate surgical foot 15 min then rewrap the ace looser, apply ice and if can tolerate Ibuprofen OTC take as package instructs.  Pt requested muscle relaxer, so she can not be so stiff when walking.  Dr. Charlsie Merles ordered Valium  #30 1 tablet tid.  Ordered and informed pt, the rx would need to be picked up in the [place] office, pt states she'll send her brother.

## 2015-05-22 NOTE — Telephone Encounter (Addendum)
Julian Hy states pt is also on Lorazepam from Dr. Haywood Lasso last filled 05/15/2015 30 day supply.  Dr. Charlsie Merles cancelled the Valium and ordered Flexeril  #20 1 tablet bid.  I cancelled the Valium with Rosey Bath [phone] at 153pm and ordered Flexeril. Pt asked if her husband needed to come by the office for the Valium rx or go to the [facility] on [address] for Flexeril.  I left the message to pick up the Flexeril at the [facility].

## 2015-05-26 NOTE — Progress Notes (Signed)
DOS 05/20/2015 hammer toe repair with removal bone distal 2,3,4 right foot.

## 2015-05-27 ENCOUNTER — Encounter: Payer: 59 | Admitting: Podiatry

## 2015-05-28 ENCOUNTER — Encounter: Payer: Self-pay | Admitting: Podiatry

## 2015-05-28 ENCOUNTER — Ambulatory Visit (INDEPENDENT_AMBULATORY_CARE_PROVIDER_SITE_OTHER): Payer: 59 | Admitting: Podiatry

## 2015-05-28 ENCOUNTER — Ambulatory Visit (INDEPENDENT_AMBULATORY_CARE_PROVIDER_SITE_OTHER): Payer: 59

## 2015-05-28 ENCOUNTER — Ambulatory Visit (HOSPITAL_COMMUNITY): Payer: Self-pay | Admitting: Psychiatry

## 2015-05-28 VITALS — BP 100/74 | HR 100 | Resp 18

## 2015-05-28 DIAGNOSIS — M2041 Other hammer toe(s) (acquired), right foot: Secondary | ICD-10-CM

## 2015-05-28 DIAGNOSIS — M205X1 Other deformities of toe(s) (acquired), right foot: Secondary | ICD-10-CM

## 2015-05-28 DIAGNOSIS — Z9889 Other specified postprocedural states: Secondary | ICD-10-CM

## 2015-05-28 NOTE — Progress Notes (Signed)
Subjective:     Patient ID: Casey Diaz, female   DOB: Aug 13, 1966, 49 y.o.   MRN: [id number]  HPI patient states she is doing fine with her toes with minimal discomfort   Review of Systems     Objective:   Physical Exam Neurovascular status intact muscle strength adequate patient's noted to have negative Homans sign and has good alignment of the second third and fourth toes post digital distal arthroplasty. Wound edges are well coapted with no drainage    Assessment:     Doing well post arthroplasty digits 234 of the right foot    Plan:     Reviewed x-rays with patient and reapplied dressings and advised on continued elevation and open toed shoes. Reappoint 2 weeks for suture removal or earlier if needed

## 2015-06-03 ENCOUNTER — Encounter (INDEPENDENT_AMBULATORY_CARE_PROVIDER_SITE_OTHER): Payer: Self-pay

## 2015-06-03 ENCOUNTER — Ambulatory Visit (INDEPENDENT_AMBULATORY_CARE_PROVIDER_SITE_OTHER): Payer: 59 | Admitting: Psychiatry

## 2015-06-03 ENCOUNTER — Encounter (HOSPITAL_COMMUNITY): Payer: Self-pay | Admitting: Psychiatry

## 2015-06-03 VITALS — BP 96/66 | HR 90 | Ht 67.0 in | Wt 191.2 lb

## 2015-06-03 DIAGNOSIS — F42 Obsessive-compulsive disorder: Secondary | ICD-10-CM | POA: Diagnosis not present

## 2015-06-03 DIAGNOSIS — F332 Major depressive disorder, recurrent severe without psychotic features: Secondary | ICD-10-CM | POA: Diagnosis not present

## 2015-06-03 DIAGNOSIS — F429 Obsessive-compulsive disorder, unspecified: Secondary | ICD-10-CM | POA: Insufficient documentation

## 2015-06-03 NOTE — Progress Notes (Signed)
Psychiatric Initial Adult Assessment   Patient Identification: Casey Diaz MRN:  [id number] Date of Evaluation:  06/03/2015 Referral Source: Dr [name] Chief Complaint:depression not adequately responsive to medication and therapy   Visit Diagnosis:    ICD-9-CM ICD-10-CM   1. Severe recurrent major depression without psychotic features 296.33 F33.2   2. Obsessive compulsive disorder 300.3 F42    Diagnosis:   Patient Active Problem List   Diagnosis Date Noted  . Severe recurrent major depression without psychotic features [F33.2] 06/03/2015    Priority: Medium    Class: Chronic  . Obsessive compulsive disorder [F42] 06/03/2015    Priority: Medium    Class: Chronic   History of Present Illness:  Casey Diaz has been diagnosed with depression since aged 48 she says.  She has tried about all the antidepressants she believes but none have relieved the depression completely.  She currently takes Luvox 450 mg daily and is still depressed severely.  This current round of depression has lasted for 2 years and has gotten worse in the last year even with treatment.  She believes the death of her father was the precipitant as well as not working which she normally finds a good outlet.  She is staying home more to be there for her 13 year old son who will be entering the second grade and she believes may have ADHD.  She is also stressed over her mother's house in foreclosure and her mother, sister and brother may have nowhere to go in December.  Her OCD is one of intrusive thoughts that she has done something harmful to someone else and she has to check and re check. Elements:  Location:  depression. Quality:  daily loss of energy and sadness. Severity:  cannot feel happy. Timing:  death of father, mother about to lose her house, not being able to find a part time job to keep herself busy. Duration:  2 years this episode. Context:  as above. Associated Signs/Symptoms: Depression Symptoms:  depressed  mood, anhedonia, insomnia, fatigue, feelings of worthlessness/guilt, difficulty concentrating, impaired memory, anxiety, (Hypo) Manic Symptoms:  none Anxiety Symptoms:  Excessive Worry, Psychotic Symptoms:  none PTSD Symptoms: Negative  Past Medical History:  Past Medical History  Diagnosis Date  . Cystocele   . Uterine prolaps   . Osteoporosis   . Cerebral palsy     right arm/leg  . Anxiety   . OCD (obsessive compulsive disorder)   . Abnormal Pap smear 2011    hpv/mild dysplasia,cin1    Past Surgical History  Procedure Laterality Date  . Colposcopy  2011  . Hammer toe surgery  1/13    RIGHT SIDE   Family History:  Family History  Problem Relation Age of Onset  . Cancer Father     skin AND LUNG  . Alcohol abuse Sister     CRACK COCAINE   Social History:   Social History   Social History  . Marital Status: Married    Spouse Name: N/A  . Number of Children: N/A  . Years of Education: N/A   Social History Main Topics  . Smoking status: Never Smoker   . Smokeless tobacco: Never Used  . Alcohol Use: No     Comment: OCCASIONAL  . Drug Use: No  . Sexual Activity: Yes    Birth Control/ Protection: Pill     Comment: LOESTRIN 24 FE   Other Topics Concern  . None   Social History Narrative   Additional Social History: Does  have cerebral palsy  Musculoskeletal: Strength & Muscle Tone: right arm and leg affected by cerebral palsy and foot injury Gait & Station: broad based Patient leans: N/A  Psychiatric Specialty Exam: HPI  ROS  Blood pressure 96/66, pulse 90, height 5\' 7"  (1.702 m), weight 191 lb 3.2 oz (86.728 kg).Body mass index is 29.94 kg/(m^2).  General Appearance: Well Groomed  Eye Contact:  Good  Speech:  Clear and Coherent  Volume:  Normal  Mood:  Depressed  Affect:  Congruent  Thought Process:  Coherent and Logical  Orientation:  Full (Time, Place, and Person)  Thought Content:  Negative  Suicidal Thoughts:  No  Homicidal Thoughts:   No  Memory:  Immediate;   Good Recent;   Good Remote;   Good  Judgement:  Good  Insight:  Good  Psychomotor Activity:  Negative  Concentration:  Good  Recall:  Good  Fund of Knowledge:Good  Language: Good  Akathisia:  Negative  Handed:  Right  AIMS (if indicated):  0  Assets:  Communication Skills Desire for Improvement Financial Resources/Insurance Housing Intimacy Leisure Time Physical Health Resilience Social Support Talents/Skills Transportation Vocational/Educational  ADL's:  Intact  Cognition: WNL  Sleep:  poor   Is the patient at risk to self?  No. Has the patient been a risk to self in the past 6 months?  No. Has the patient been a risk to self within the distant past?  No. Is the patient a risk to others?  No. Has the patient been a risk to others in the past 6 months?  No. Has the patient been a risk to others within the distant past?  No.  Allergies:   Allergies  Allergen Reactions  . Hydrocodone Itching  . Bactrim [Sulfamethoxazole-Trimethoprim]   . Dust Mite Extract   . Other     PT IS ALLERGIC TO CAT DANDER AND RAGWEED  . Pollen Extract    Current Medications: Current Outpatient Prescriptions  Medication Sig Dispense Refill  . baclofen (LIORESAL) 10 MG tablet     . buPROPion (WELLBUTRIN XL) 150 MG 24 hr tablet     . cetirizine (ZYRTEC) 10 MG tablet Take 10 mg by mouth daily.    . cyclobenzaprine (FLEXERIL) 10 MG tablet Take 1 tablet (10 mg total) by mouth 3 (three) times daily as needed for muscle spasms. 20 tablet 0  . fluticasone (FLONASE) 50 MCG/ACT nasal spray Place 2 sprays into the nose daily.    . fluvoxaMINE (LUVOX) 100 MG tablet Take 100 mg by mouth at bedtime.    Marland Kitchen HYDROcodone-acetaminophen (NORCO) 10-325 MG per tablet Take 1 tablet by mouth every 4 (four) hours as needed.    . hydrocortisone (ANUSOL-HC) 2.5 % rectal cream Place rectally 2 (two) times daily. x 7-14 days 30 g 0  . LORazepam (ATIVAN) 1 MG tablet     . meperidine  (DEMEROL) 50 MG tablet Take 50 mg by mouth every 4 (four) hours as needed for severe pain.    . montelukast (SINGULAIR) 10 MG tablet     . nitrofurantoin (MACRODANTIN) 100 MG capsule     . Norethindrone Acetate-Ethinyl Estrad-FE (LOESTRIN 24 FE) 1-20 MG-MCG(24) tablet Take 1 tablet by mouth daily. 1 Package 11  . promethazine (PHENERGAN) 25 MG tablet Take 25 mg by mouth every 4 (four) hours as needed for nausea or vomiting.    . temazepam (RESTORIL) 30 MG capsule Take 30 mg by mouth at bedtime as needed.    . venlafaxine XR (EFFEXOR-XR)  150 MG 24 hr capsule Take 150 mg by mouth daily.     No current facility-administered medications for this visit.    Previous Psychotropic Medications: Yes   Substance Abuse History in the last 12 months:  No.  Consequences of Substance Abuse: Negative  Medical Decision Making:  Established Problem, Worsening (2)  Treatment Plan Summary: qualifies for TMS    Carolanne Grumbling 8/17/20162:18 PM

## 2015-06-04 ENCOUNTER — Ambulatory Visit (INDEPENDENT_AMBULATORY_CARE_PROVIDER_SITE_OTHER): Payer: 59 | Admitting: Podiatry

## 2015-06-04 ENCOUNTER — Encounter: Payer: Self-pay | Admitting: Podiatry

## 2015-06-04 VITALS — BP 94/64 | HR 94 | Resp 16

## 2015-06-04 DIAGNOSIS — M2041 Other hammer toe(s) (acquired), right foot: Secondary | ICD-10-CM

## 2015-06-04 DIAGNOSIS — M205X1 Other deformities of toe(s) (acquired), right foot: Secondary | ICD-10-CM

## 2015-06-04 DIAGNOSIS — G809 Cerebral palsy, unspecified: Secondary | ICD-10-CM

## 2015-06-04 DIAGNOSIS — M6789 Other specified disorders of synovium and tendon, multiple sites: Secondary | ICD-10-CM

## 2015-06-04 DIAGNOSIS — M76829 Posterior tibial tendinitis, unspecified leg: Secondary | ICD-10-CM

## 2015-06-04 DIAGNOSIS — Z9889 Other specified postprocedural states: Secondary | ICD-10-CM

## 2015-06-04 NOTE — Progress Notes (Signed)
Subjective:     Patient ID: Casey Diaz, female   DOB: 10/25/1965, 49 y.o.   MRN: [id number]  HPI patient states that my toes do seem improved but I do have cerebral palsy and my foot and ankle do not work properly and I would like to get a brace   Review of Systems     Objective:   Physical Exam Neurovascular status intact muscle strength is adequate except for loss of PT function of a moderate nature on the right with cerebral palsy causing the spasticity to the gait on the right side. The toes are in good alignment with wound edges well coapted and no drainage    Assessment:     Doing well post digital procedure second third and fourth digits right and also is noted to have significant foot and ankle structural issues right secondary to cerebral palsy    Plan:     Stitches removed and wound edges remain coapted well on the right and I've recommended an AFO brace with a hinge to help with her cerebral palsy on her right side. She is scheduled for bracing

## 2015-06-15 ENCOUNTER — Telehealth: Payer: Self-pay | Admitting: *Deleted

## 2015-06-15 NOTE — Telephone Encounter (Signed)
Pt state she had surgery 05/20/2015 right foot 3 toes, and they are still swollen and painful.  Pt asked if she could begin some form of exercise, biking or weight lifting.

## 2015-06-24 ENCOUNTER — Ambulatory Visit: Payer: 59 | Admitting: *Deleted

## 2015-06-24 DIAGNOSIS — M76829 Posterior tibial tendinitis, unspecified leg: Secondary | ICD-10-CM

## 2015-06-24 NOTE — Progress Notes (Signed)
Patient ID: Casey Diaz, female   DOB: [birth year], 49 y.o.   MRN: [id number] Patient presents for brace casting with [facility]

## 2015-07-01 ENCOUNTER — Telehealth: Payer: Self-pay | Admitting: *Deleted

## 2015-07-01 NOTE — Telephone Encounter (Signed)
Pt. Said she has left a few messages, she would like to know about the price of Brace.  Pt. States she would still like to have brace, even if she has to make payments. Pt. States she would just like a price.

## 2015-07-01 NOTE — Telephone Encounter (Signed)
Casey Diaz

## 2015-07-29 ENCOUNTER — Ambulatory Visit: Payer: 59 | Admitting: *Deleted

## 2015-07-29 DIAGNOSIS — M76829 Posterior tibial tendinitis, unspecified leg: Secondary | ICD-10-CM

## 2015-07-29 DIAGNOSIS — M6789 Other specified disorders of synovium and tendon, multiple sites: Secondary | ICD-10-CM | POA: Diagnosis not present

## 2015-07-29 NOTE — Progress Notes (Signed)
Patient ID: Casey GriffithsJoy E Mccrackin, female   DOB: 1966/05/26, 49 y.o.   MRN: [id number] [facility] [place] [facility]

## 2015-09-09 ENCOUNTER — Ambulatory Visit: Payer: 59 | Admitting: Podiatry

## 2016-04-13 ENCOUNTER — Ambulatory Visit (INDEPENDENT_AMBULATORY_CARE_PROVIDER_SITE_OTHER): Payer: Managed Care, Other (non HMO) | Admitting: Podiatry

## 2016-04-13 ENCOUNTER — Encounter: Payer: Self-pay | Admitting: Podiatry

## 2016-04-13 VITALS — BP 101/70 | HR 83 | Resp 14

## 2016-04-13 DIAGNOSIS — M2011 Hallux valgus (acquired), right foot: Secondary | ICD-10-CM

## 2016-04-13 DIAGNOSIS — M21821 Other specified acquired deformities of right upper arm: Secondary | ICD-10-CM

## 2016-04-13 DIAGNOSIS — L84 Corns and callosities: Secondary | ICD-10-CM

## 2016-04-13 NOTE — Progress Notes (Signed)
Subjective:     Patient ID: Casey Diaz, female   DOB: [birth year], 50 y.o.   MRN: [id number]  HPI this patient presents to the office with chief complaint of a painful blackened area on the inside of her right big toe. Patient states this area has become painful walking and wearing her shoes. She has past medical history of having cerebral palsy which requires her to wear a brace on her right foot. She also has had surgery for the correction of the toes of the right foot. She is concerned about this area and believes it is a nonhealing skin lesion. She presents the office today for an evaluation and treatment of this condition   Review of Systems     Objective:   Physical Exam GENERAL APPEARANCE: Alert, conversant. Appropriately groomed. No acute distress.  VASCULAR: Pedal pulses are  palpable at  [facility] and [facility] bilateral.  Capillary refill time is immediate to all digits,  Normal temperature gradient.  Digital hair growth is present bilateral  NEUROLOGIC: sensation is normal to 5.07 monofilament at 5/5 sites bilateral.  Light touch is intact bilateral, Muscle strength normal left foot and diminished right foot. MUSCULOSKELETAL: acceptable muscle strength, tone and stability bilateral.  Intrinsic muscluature intact bilateral.  Rectus appearance of foot and digits noted bilateral. Hallux interphalangeus B/L  DERMATOLOGIC: skin color, texture, and turgor are within normal limits.  No preulcerative lesions or ulcers  are seen, no interdigital maceration noted.  No open lesions present.  Digital nails are asymptomatic. No drainage noted. Hemmorrhagic callus medial aspect right hallux.      Assessment:     Hallux interphalangeus right hallux  Hemorrhagic callus right foot.     Plan:     ROV  Debridement of callus  dispense toe caps.  RTC prn.  Use a pumice stone weekly  RTC prn   Helane GuntherGregory Kyrese Gartman DPM

## 2016-07-06 NOTE — Patient Instructions (Addendum)
Casey Diaz  07/06/2016   Your procedure is scheduled on: Friday 07/08/2016  Report to [facility]  Entrance take [name]  elevators to 3rd floor to  Short Stay Center at  200 PM.  Call this number if you have problems the morning of surgery [phone]   Remember: ONLY 1 PERSON MAY GO WITH YOU TO SHORT STAY TO GET  READY MORNING OF YOUR SURGERY.     Do not eat food  :After Midnight.  MAY HAVE CLEAR LIQUIDS FROM MIDNIGHT UP UNTIL 1000 AM THEN MORNING OF SURGERY THEN NOTHING UNTIL AFTER SURGERY!     CLEAR LIQUID DIET   Foods Allowed                                                                     Foods Excluded  Coffee and tea, regular and decaf                             liquids that you cannot  Plain Jell-O in any flavor                                             see through such as: Fruit ices (not with fruit pulp)                                     milk, soups, orange juice  Iced Popsicles                                    All solid food Carbonated beverages, regular and diet                                    Cranberry, grape and apple juices Sports drinks like Gatorade Lightly seasoned clear broth or consume(fat free) Sugar, honey syrup  Sample Menu Breakfast                                Lunch                                     Supper Cranberry juice                    Beef broth                            Chicken broth Jell-O  Grape juice                           Apple juice Coffee or tea                        Jell-O                                      Popsicle                                                Coffee or tea                        Coffee or tea  _____________________________________________________________________     Take these medicines the morning of surgery with A SIP OF WATER: Bupropion (Wellbutrin XL), Flonase nasal spray, Singulair, Fluvoxamine (Luvox)                                  You may not have any metal on your body including hair pins and              piercings  Do not wear jewelry, make-up, lotions, powders or perfumes, deodorant             Do not wear nail polish.  Do not shave  48 hours prior to surgery.              Men may shave face and neck.   Do not bring valuables to the hospital. [place] IS NOT             RESPONSIBLE   FOR VALUABLES.  Contacts, dentures or bridgework may not be worn into surgery.  Leave suitcase in the car. After surgery it may be brought to your room.                  Please read over the following fact sheets you were given: _____________________________________________________________________             [facility] - Preparing for [facility] Before surgery, you can play an important role.  Because skin is not sterile, your skin needs to be as free of germs as possible.  You can reduce the number of germs on your skin by washing with CHG (chlorahexidine gluconate) soap before surgery.  CHG is an antiseptic cleaner which kills germs and bonds with the skin to continue killing germs even after washing. Please DO NOT use if you have an allergy to CHG or antibacterial soaps.  If your skin becomes reddened/irritated stop using the CHG and inform your nurse when you arrive at Short Stay. Do not shave (including legs and underarms) for at least 48 hours prior to the first CHG shower.  You may shave your face/neck. Please follow these instructions carefully:  1.  Shower with CHG Soap the night before surgery and the  morning of Surgery.  2.  If you choose to wash your hair, wash your hair first as usual with your  normal  shampoo.  3.  After you shampoo, rinse your hair and body thoroughly to remove the  shampoo.                           4.  Use CHG as you would any other liquid soap.  You can apply chg directly  to the skin and wash                       Gently with a scrungie or clean washcloth.  5.  Apply the CHG Soap  to your body ONLY FROM THE NECK DOWN.   Do not use on face/ open                           Wound or open sores. Avoid contact with eyes, ears mouth and genitals (private parts).                       Wash face,  Genitals (private parts) with your normal soap.             6.  Wash thoroughly, paying special attention to the area where your surgery  will be performed.  7.  Thoroughly rinse your body with warm water from the neck down.  8.  DO NOT shower/wash with your normal soap after using and rinsing off  the CHG Soap.                9.  Pat yourself dry with a clean towel.            10.  Wear clean pajamas.            11.  Place clean sheets on your bed the night of your first shower and do not  sleep with pets. Day of Surgery : Do not apply any lotions/deodorants the morning of surgery.  Please wear clean clothes to the hospital/surgery center.  FAILURE TO FOLLOW THESE INSTRUCTIONS MAY RESULT IN THE CANCELLATION OF YOUR SURGERY PATIENT SIGNATURE_________________________________  NURSE SIGNATURE__________________________________  ________________________________________________________________________   Rogelia Mire  An incentive spirometer is a tool that can help keep your lungs clear and active. This tool measures how well you are filling your lungs with each breath. Taking long deep breaths may help reverse or decrease the chance of developing breathing (pulmonary) problems (especially infection) following:  A long period of time when you are unable to move or be active. BEFORE THE PROCEDURE   If the spirometer includes an indicator to show your best effort, your nurse or respiratory therapist will set it to a desired goal.  If possible, sit up straight or lean slightly forward. Try not to slouch.  Hold the incentive spirometer in an upright position. INSTRUCTIONS FOR USE  1. Sit on the edge of your bed if possible, or sit up as far as you can in bed or on a  chair. 2. Hold the incentive spirometer in an upright position. 3. Breathe out normally. 4. Place the mouthpiece in your mouth and seal your lips tightly around it. 5. Breathe in slowly and as deeply as possible, raising the piston or the ball toward the top of the column. 6. Hold your breath for 3-5 seconds or for as long as possible. Allow the piston or ball to fall to the bottom of the column. 7. Remove the mouthpiece from your mouth and breathe out normally. 8. Rest for a few seconds and repeat Steps 1 through 7 at least 10 times  every 1-2 hours when you are awake. Take your time and take a few normal breaths between deep breaths. 9. The spirometer may include an indicator to show your best effort. Use the indicator as a goal to work toward during each repetition. 10. After each set of 10 deep breaths, practice coughing to be sure your lungs are clear. If you have an incision (the cut made at the time of surgery), support your incision when coughing by placing a pillow or rolled up towels firmly against it. Once you are able to get out of bed, walk around indoors and cough well. You may stop using the incentive spirometer when instructed by your caregiver.  RISKS AND COMPLICATIONS  Take your time so you do not get dizzy or light-headed.  If you are in pain, you may need to take or ask for pain medication before doing incentive spirometry. It is harder to take a deep breath if you are having pain. AFTER USE  Rest and breathe slowly and easily.  It can be helpful to keep track of a log of your progress. Your caregiver can provide you with a simple table to help with this. If you are using the spirometer at home, follow these instructions: SEEK MEDICAL CARE IF:   You are having difficultly using the spirometer.  You have trouble using the spirometer as often as instructed.  Your pain medication is not giving enough relief while using the spirometer.  You develop fever of 100.5 F  (38.1 C) or higher. SEEK IMMEDIATE MEDICAL CARE IF:   You cough up bloody sputum that had not been present before.  You develop fever of 102 F (38.9 C) or greater.  You develop worsening pain at or near the incision site. MAKE SURE YOU:   Understand these instructions.  Will watch your condition.  Will get help right away if you are not doing well or get worse. Document Released: 02/13/2007 Document Revised: 12/26/2011 Document Reviewed: 04/16/2007 Exit[employer] Patient Information 2014 ExitCare, [place].   ________________________________________________________________________  WHAT IS A BLOOD TRANSFUSION? Blood Transfusion Information  A transfusion is the replacement of blood or some of its parts. Blood is made up of multiple cells which provide different functions.  Red blood cells carry oxygen and are used for blood loss replacement.  White blood cells fight against infection.  Platelets control bleeding.  Plasma helps clot blood.  Other blood products are available for specialized needs, such as hemophilia or other clotting disorders. BEFORE THE TRANSFUSION  Who gives blood for transfusions?   Healthy volunteers who are fully evaluated to make sure their blood is safe. This is blood bank blood. Transfusion therapy is the safest it has ever been in the practice of medicine. Before blood is taken from a donor, a complete history is taken to make sure that person has no history of diseases nor engages in risky social behavior (examples are intravenous drug use or sexual activity with multiple partners). The donor's travel history is screened to minimize risk of transmitting infections, such as malaria. The donated blood is tested for signs of infectious diseases, such as HIV and hepatitis. The blood is then tested to be sure it is compatible with you in order to minimize the chance of a transfusion reaction. If you or a relative donates blood, this is often done in anticipation  of surgery and is not appropriate for emergency situations. It takes many days to process the donated blood. RISKS AND COMPLICATIONS Although transfusion therapy is very  safe and saves many lives, the main dangers of transfusion include:   Getting an infectious disease.  Developing a transfusion reaction. This is an allergic reaction to something in the blood you were given. Every precaution is taken to prevent this. The decision to have a blood transfusion has been considered carefully by your caregiver before blood is given. Blood is not given unless the benefits outweigh the risks. AFTER THE TRANSFUSION  Right after receiving a blood transfusion, you will usually feel much better and more energetic. This is especially true if your red blood cells have gotten low (anemic). The transfusion raises the level of the red blood cells which carry oxygen, and this usually causes an energy increase.  The nurse administering the transfusion will monitor you carefully for complications. HOME CARE INSTRUCTIONS  No special instructions are needed after a transfusion. You may find your energy is better. Speak with your caregiver about any limitations on activity for underlying diseases you may have. SEEK MEDICAL CARE IF:   Your condition is not improving after your transfusion.  You develop redness or irritation at the intravenous (IV) site. SEEK IMMEDIATE MEDICAL CARE IF:  Any of the following symptoms occur over the next 12 hours:  Shaking chills.  You have a temperature by mouth above 102 F (38.9 C), not controlled by medicine.  Chest, back, or muscle pain.  People around you feel you are not acting correctly or are confused.  Shortness of breath or difficulty breathing.  Dizziness and fainting.  You get a rash or develop hives.  You have a decrease in urine output.  Your urine turns a dark color or changes to pink, red, or brown. Any of the following symptoms occur over the next 10  days:  You have a temperature by mouth above 102 F (38.9 C), not controlled by medicine.  Shortness of breath.  Weakness after normal activity.  The white part of the eye turns yellow (jaundice).  You have a decrease in the amount of urine or are urinating less often.  Your urine turns a dark color or changes to pink, red, or brown. Document Released: 09/30/2000 Document Revised: 12/26/2011 Document Reviewed: 05/19/2008 [facility] Patient Information 2014 [place], [place].  _______________________________________________________________________

## 2016-07-06 NOTE — Progress Notes (Signed)
Patient coming for pre op-  PLEASE PLACE PRE OP ORDERS IN EPIC  Thanks

## 2016-07-07 ENCOUNTER — Encounter (HOSPITAL_COMMUNITY): Payer: Self-pay

## 2016-07-07 ENCOUNTER — Encounter (HOSPITAL_COMMUNITY)
Admission: RE | Admit: 2016-07-07 | Discharge: 2016-07-07 | Disposition: A | Discharge status: Home / Self Care | Payer: Managed Care, Other (non HMO) | Source: Ambulatory Visit | Attending: Orthopedic Surgery | Admitting: Orthopedic Surgery

## 2016-07-07 HISTORY — DX: Headache, unspecified: R51.9

## 2016-07-07 HISTORY — DX: Depression, unspecified: F32.A

## 2016-07-07 HISTORY — DX: Myoneural disorder, unspecified: G70.9

## 2016-07-07 HISTORY — DX: Major depressive disorder, single episode, unspecified: F32.9

## 2016-07-07 HISTORY — DX: Headache: R51

## 2016-07-07 LAB — CBC
HEMATOCRIT: 40.7 % (ref 36.0–46.0)
Hemoglobin: 13.8 g/dL (ref 12.0–15.0)
MCH: 31.4 pg (ref 26.0–34.0)
MCHC: 33.9 g/dL (ref 30.0–36.0)
MCV: 92.7 fL (ref 78.0–100.0)
Platelets: 341 10*3/uL (ref 150–400)
RBC: 4.39 MIL/uL (ref 3.87–5.11)
RDW: 13.3 % (ref 11.5–15.5)
WBC: 6.9 10*3/uL (ref 4.0–10.5)

## 2016-07-07 LAB — HCG, SERUM, QUALITATIVE: Preg, Serum: NEGATIVE

## 2016-07-07 LAB — ABO/RH: ABO/RH(D): O NEG

## 2016-07-07 NOTE — H&P (Signed)
Casey Diaz is an 50 y.o. female.    Procedure:     Left hip closed reduction and percutaneous screw fixation  Chief Complaint: Left femoral neck fracture   HPI: Pt is a 50 y.o. female complaining of left hip pain for couple of weeks. Pain had continually increased since the beginning. X-rays / MRI in the clinic show left femoral neck fracture. Pt has tried various conservative treatments which have failed to alleviate their symptoms, including analgesic medications, assistance devices and activity modification. Various options are discussed with the patient. Risks, benefits and expectations were discussed with the patient. Patient understand the risks, benefits and expectations and wishes to proceed with surgery.    PCP: Gwen PoundsUSSO,JOHN M, MD  D/C Plans:      Home with HHPT/SNF  Post-op Meds:       No Rx given   Decadron:      Is to be given  FYI:     ASA  Norco   PMH: Past Medical History:  Diagnosis Date  . 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 Anxiety   . Cerebral palsy (HCC)    right arm/leg  . Cystocele   . Depression   . Headache   . Neuromuscular disorder (HCC)    Cerebral Palsy  . OCD (obsessive compulsive disorder)   . Osteoporosis   . Uterine prolaps     PSH: Past Surgical History:  Procedure Laterality Date  . COLPOSCOPY  2011  . ELBOW SURGERY     left elbow-separation of bones -age 50  . ELBOW SURGERY  2015   fell on ice- right elbow fracture  . FRACTURE SURGERY    . HAMMER TOE SURGERY  1/13   RIGHT SIDE  . WRIST SURGERY  2005   left wrist    Social History:  reports that she has never smoked. She has never used smokeless tobacco. She reports that she drinks alcohol. She reports that she does not use drugs.  Allergies:  Allergies  Allergen Reactions  . Hydrocodone Itching  . Sulfamethoxazole-Trimethoprim Itching  . Dust Mite Extract Other (See Comments)    Sneezing, watery eyes, runny nose  . Latex Itching  . Other Other (See  Comments)    PT IS ALLERGIC TO CAT DANDER AND RAGWEED - Sneezing, watery eyes, runny nose   . Pollen Extract Other (See Comments)    Sneezing, watery eyes, runny nose     Medications: No current facility-administered medications for this encounter.    Current Outpatient Prescriptions  Medication Sig Dispense Refill  . baclofen (LIORESAL) 10 MG tablet Take 10 mg by mouth daily as needed for muscle spasms.     Marland Kitchen. buPROPion (WELLBUTRIN XL) 150 MG 24 hr tablet Take 300 mg by mouth daily.     . diclofenac (CATAFLAM) 50 MG tablet Take 50 mg by mouth every 6 (six) hours as needed (For pain.).     Marland Kitchen. fluticasone (FLONASE) 50 MCG/ACT nasal spray Place 2 sprays into the nose daily.    . fluvoxaMINE (LUVOX) 100 MG tablet Take 100-300 mg by mouth See admin instructions. 100mg  in am, 300mg  in pm    . hydrocortisone (ANUSOL-HC) 2.5 % rectal cream Place rectally 2 (two) times daily. x 7-14 days (Patient taking differently: Place 1 application rectally as needed for hemorrhoids or itching. ) 30 g 0  . ketotifen (ZADITOR) 0.025 % ophthalmic solution Place 3 drops into both eyes 2 (two) times daily as needed (For allergies.).     .Marland Kitchen  LINZESS 145 MCG CAPS capsule Take 145 mcg by mouth daily.    Marland Kitchen LORazepam (ATIVAN) 1 MG tablet Take 1 mg by mouth every 6 (six) hours as needed for anxiety.     Marland Kitchen MIBELAS 24 FE 1-20 MG-MCG(24) CHEW Chew 1 tablet by mouth at bedtime.     . montelukast (SINGULAIR) 10 MG tablet Take 10 mg by mouth daily.     . Multiple Vitamins-Minerals (ADULT GUMMY PO) Take 1 tablet by mouth daily.    . nitrofurantoin (MACRODANTIN) 100 MG capsule Take 100 mg by mouth as needed (For urinary tract infection.).     Marland Kitchen promethazine (PHENERGAN) 25 MG tablet Take 25 mg by mouth every 4 (four) hours as needed for nausea or vomiting.    . temazepam (RESTORIL) 30 MG capsule Take 30 mg by mouth at bedtime as needed for sleep.     . traMADol (ULTRAM) 50 MG tablet Take 50 mg by mouth every 6 (six) hours as needed  for moderate pain.    Marland Kitchen zonisamide (ZONEGRAN) 50 MG capsule Take 100 mg by mouth at bedtime.    . cyclobenzaprine (FLEXERIL) 10 MG tablet Take 1 tablet (10 mg total) by mouth 3 (three) times daily as needed for muscle spasms. (Patient not taking: Reported on 07/07/2016) 20 tablet 0    Results for orders placed or performed during the hospital encounter of 07/07/16 (from the past 48 hour(s))  CBC     Status: None   Collection Time: 07/07/16 10:30 AM  Result Value Ref Range   WBC 6.9 4.0 - 10.5 K/uL   RBC 4.39 3.87 - 5.11 MIL/uL   Hemoglobin 13.8 12.0 - 15.0 g/dL   HCT 21.3 08.6 - 57.8 %   MCV 92.7 78.0 - 100.0 fL   MCH 31.4 26.0 - 34.0 pg   MCHC 33.9 30.0 - 36.0 g/dL   RDW 46.9 62.9 - 52.8 %   Platelets 341 150 - 400 K/uL  hCG, serum, qualitative     Status: None   Collection Time: 07/07/16 10:30 AM  Result Value Ref Range   Preg, Serum NEGATIVE NEGATIVE    Comment:        THE SENSITIVITY OF THIS METHODOLOGY IS >10 mIU/mL.   Type and screen Order type and screen if day of surgery is less than 15 days from draw of preadmission visit or order morning of surgery if day of surgery is greater than 6 days from preadmission visit.     Status: None   Collection Time: 07/07/16 10:30 AM  Result Value Ref Range   ABO/RH(D) O NEG    Antibody Screen NEG    Sample Expiration 07/21/2016    Extend sample reason NO TRANSFUSIONS OR PREGNANCY IN THE PAST 3 MONTHS   ABO/Rh     Status: None   Collection Time: 07/07/16 10:30 AM  Result Value Ref Range   ABO/RH(D) O NEG      Review of Systems  Constitutional: Negative.   Eyes: Negative.   Respiratory: Negative.   Cardiovascular: Negative.   Gastrointestinal: Negative.   Genitourinary: Negative.   Musculoskeletal: Positive for joint pain.  Skin: Negative.   Neurological: Positive for headaches.  Endo/Heme/Allergies: Negative.   Psychiatric/Behavioral: Positive for depression. The patient is nervous/anxious.      Physical Exam    Constitutional: She is oriented to person, place, and time. She appears well-developed.  HENT:  Head: Normocephalic.  Eyes: Pupils are equal, round, and reactive to light.  Neck: Neck supple.  No JVD present. No tracheal deviation present. No thyromegaly present.  Cardiovascular: Normal rate, regular rhythm and intact distal pulses.   Respiratory: Effort normal and breath sounds normal. No respiratory distress. She has no wheezes.  GI: Soft. There is no tenderness. There is no guarding.  Musculoskeletal:       Left hip: She exhibits decreased range of motion, decreased strength, tenderness and bony tenderness. She exhibits no deformity and no laceration.  Lymphadenopathy:    She has no cervical adenopathy.  Neurological: She is alert and oriented to person, place, and time.  Skin: Skin is warm and dry.  Psychiatric: She has a normal mood and affect.       Assessment/Plan Assessment: Left femoral neck fracture  Plan: Patient will undergo a left hip closed reduction and percutaneous screw fixation on 07/08/2016 per Dr. Charlann Boxer at [facility]. Risks benefits and expectations were discussed with the patient. Patient understand risks, benefits and expectations and wishes to proceed.   Anastasio Auerbach Holdyn Poyser   PA-C  07/07/2016, 4:25 PM

## 2016-07-08 ENCOUNTER — Inpatient Hospital Stay (HOSPITAL_COMMUNITY): Payer: Managed Care, Other (non HMO)

## 2016-07-08 ENCOUNTER — Inpatient Hospital Stay (HOSPITAL_COMMUNITY): Payer: Managed Care, Other (non HMO) | Admitting: Anesthesiology

## 2016-07-08 ENCOUNTER — Encounter (HOSPITAL_COMMUNITY): Payer: Self-pay | Admitting: Anesthesiology

## 2016-07-08 ENCOUNTER — Encounter (HOSPITAL_COMMUNITY)
Admission: RE | Disposition: A | Discharge status: Home Health | Payer: Self-pay | Source: Ambulatory Visit | Attending: Orthopedic Surgery

## 2016-07-08 ENCOUNTER — Inpatient Hospital Stay (HOSPITAL_COMMUNITY)
Admission: RE | Admit: 2016-07-08 | Discharge: 2016-07-09 | DRG: 482 | Disposition: A | Discharge status: Home Health | Payer: Managed Care, Other (non HMO) | Source: Ambulatory Visit | Attending: Orthopedic Surgery | Admitting: Orthopedic Surgery

## 2016-07-08 DIAGNOSIS — E669 Obesity, unspecified: Secondary | ICD-10-CM | POA: Diagnosis present

## 2016-07-08 DIAGNOSIS — F419 Anxiety disorder, unspecified: Secondary | ICD-10-CM | POA: Diagnosis present

## 2016-07-08 DIAGNOSIS — W19XXXA Unspecified fall, initial encounter: Secondary | ICD-10-CM | POA: Diagnosis present

## 2016-07-08 DIAGNOSIS — F329 Major depressive disorder, single episode, unspecified: Secondary | ICD-10-CM | POA: Diagnosis present

## 2016-07-08 DIAGNOSIS — S72002A Fracture of unspecified part of neck of left femur, initial encounter for closed fracture: Principal | ICD-10-CM | POA: Diagnosis present

## 2016-07-08 DIAGNOSIS — Z419 Encounter for procedure for purposes other than remedying health state, unspecified: Secondary | ICD-10-CM

## 2016-07-08 DIAGNOSIS — G808 Other cerebral palsy: Secondary | ICD-10-CM | POA: Diagnosis present

## 2016-07-08 DIAGNOSIS — Z6828 Body mass index (BMI) 28.0-28.9, adult: Secondary | ICD-10-CM | POA: Diagnosis not present

## 2016-07-08 DIAGNOSIS — M810 Age-related osteoporosis without current pathological fracture: Secondary | ICD-10-CM | POA: Diagnosis present

## 2016-07-08 DIAGNOSIS — Z79899 Other long term (current) drug therapy: Secondary | ICD-10-CM

## 2016-07-08 DIAGNOSIS — M25552 Pain in left hip: Secondary | ICD-10-CM | POA: Diagnosis present

## 2016-07-08 HISTORY — PX: HIP PINNING,CANNULATED: SHX1758

## 2016-07-08 LAB — TYPE AND SCREEN
ABO/RH(D): O NEG
Antibody Screen: NEGATIVE

## 2016-07-08 SURGERY — FIXATION, FEMUR, NECK, PERCUTANEOUS, USING SCREW
Anesthesia: General | Site: Hip | Laterality: Left

## 2016-07-08 MED ORDER — ADULT GUMMY PO CHEW: CHEWABLE_TABLET | Freq: Every day | ORAL | Status: DC

## 2016-07-08 MED ORDER — METHOCARBAMOL 500 MG PO TABS
500.0000 mg | ORAL_TABLET | Freq: Four times a day (QID) | ORAL | Status: DC | PRN
  Administered 2016-07-09: 500 mg via ORAL
  Filled 2016-07-08: qty 1

## 2016-07-08 MED ORDER — PHENOL 1.4 % MT LIQD: 1.0000 | OROMUCOSAL | Status: DC | PRN

## 2016-07-08 MED ORDER — ONDANSETRON HCL 4 MG PO TABS: 4.0000 mg | ORAL_TABLET | Freq: Four times a day (QID) | ORAL | Status: DC | PRN

## 2016-07-08 MED ORDER — LIDOCAINE 2% (20 MG/ML) 5 ML SYRINGE
INTRAMUSCULAR | Status: DC | PRN
  Administered 2016-07-08: 50 mg via INTRAVENOUS

## 2016-07-08 MED ORDER — FLUVOXAMINE MALEATE 100 MG PO TABS
300.0000 mg | ORAL_TABLET | Freq: Every day | ORAL | Status: DC
  Administered 2016-07-09: 300 mg via ORAL
  Filled 2016-07-08 (×2): qty 3

## 2016-07-08 MED ORDER — MORPHINE SULFATE (PF) 2 MG/ML IV SOLN: 0.5000 mg | INTRAVENOUS | Status: DC | PRN

## 2016-07-08 MED ORDER — FLUTICASONE PROPIONATE 50 MCG/ACT NA SUSP
2.0000 | Freq: Every day | NASAL | Status: DC
  Administered 2016-07-09: 2 via NASAL
  Filled 2016-07-08: qty 16

## 2016-07-08 MED ORDER — KETOTIFEN FUMARATE 0.025 % OP SOLN: 3.0000 [drp] | Freq: Two times a day (BID) | OPHTHALMIC | Status: DC | PRN

## 2016-07-08 MED ORDER — POLYETHYLENE GLYCOL 3350 17 G PO PACK: 17.0000 g | PACK | Freq: Every day | ORAL | Status: DC | PRN

## 2016-07-08 MED ORDER — ACETAMINOPHEN 325 MG PO TABS
650.0000 mg | ORAL_TABLET | Freq: Four times a day (QID) | ORAL | Status: DC | PRN
Start: 2016-07-08 — End: 2016-07-09
  Administered 2016-07-09: 650 mg via ORAL
  Filled 2016-07-08: qty 2

## 2016-07-08 MED ORDER — ALUM & MAG HYDROXIDE-SIMETH 200-200-20 MG/5ML PO SUSP: 30.0000 mL | ORAL | Status: DC | PRN

## 2016-07-08 MED ORDER — ZONISAMIDE 100 MG PO CAPS: 100.0000 mg | ORAL_CAPSULE | Freq: Every day | ORAL | Status: DC

## 2016-07-08 MED ORDER — PHENYLEPHRINE 40 MCG/ML (10ML) SYRINGE FOR IV PUSH (FOR BLOOD PRESSURE SUPPORT)
PREFILLED_SYRINGE | INTRAVENOUS | Status: DC | PRN
  Administered 2016-07-08 (×5): 80 ug via INTRAVENOUS

## 2016-07-08 MED ORDER — SODIUM CHLORIDE 0.9 % IV SOLN
INTRAVENOUS | Status: DC
  Administered 2016-07-08: 20:00:00 via INTRAVENOUS

## 2016-07-08 MED ORDER — CEFAZOLIN IN D5W 1 GM/50ML IV SOLN
1.0000 g | Freq: Four times a day (QID) | INTRAVENOUS | Status: AC
  Administered 2016-07-09 (×2): 1 g via INTRAVENOUS
  Filled 2016-07-08 (×3): qty 50

## 2016-07-08 MED ORDER — PHENYLEPHRINE 40 MCG/ML (10ML) SYRINGE FOR IV PUSH (FOR BLOOD PRESSURE SUPPORT)
PREFILLED_SYRINGE | INTRAVENOUS | Status: AC
  Filled 2016-07-08: qty 10

## 2016-07-08 MED ORDER — ADULT MULTIVITAMIN W/MINERALS CH
1.0000 | ORAL_TABLET | Freq: Every day | ORAL | Status: DC
  Administered 2016-07-09: 1 via ORAL
  Filled 2016-07-08: qty 1

## 2016-07-08 MED ORDER — NORETHIN ACE-ETH ESTRAD-FE 1-20 MG-MCG(24) PO CHEW
1.0000 | CHEWABLE_TABLET | Freq: Every day | ORAL | Status: DC
  Administered 2016-07-08: 1 via ORAL

## 2016-07-08 MED ORDER — FENTANYL CITRATE (PF) 100 MCG/2ML IJ SOLN
INTRAMUSCULAR | Status: AC
  Filled 2016-07-08: qty 2

## 2016-07-08 MED ORDER — HYDROMORPHONE HCL 1 MG/ML IJ SOLN: 0.5000 mg | INTRAMUSCULAR | Status: DC | PRN

## 2016-07-08 MED ORDER — LACTATED RINGERS IV SOLN
INTRAVENOUS | Status: DC
  Administered 2016-07-08 (×2): via INTRAVENOUS

## 2016-07-08 MED ORDER — MONTELUKAST SODIUM 10 MG PO TABS
10.0000 mg | ORAL_TABLET | Freq: Every day | ORAL | Status: DC
  Administered 2016-07-09: 10 mg via ORAL
  Filled 2016-07-08: qty 1

## 2016-07-08 MED ORDER — DEXAMETHASONE SODIUM PHOSPHATE 10 MG/ML IJ SOLN: 10.0000 mg | Freq: Once | INTRAMUSCULAR | Status: DC

## 2016-07-08 MED ORDER — LINACLOTIDE 145 MCG PO CAPS
145.0000 ug | ORAL_CAPSULE | Freq: Every day | ORAL | Status: DC
  Administered 2016-07-09: 145 ug via ORAL
  Filled 2016-07-08: qty 1

## 2016-07-08 MED ORDER — PROMETHAZINE HCL 25 MG/ML IJ SOLN: 6.2500 mg | INTRAMUSCULAR | Status: DC | PRN

## 2016-07-08 MED ORDER — CEFAZOLIN SODIUM-DEXTROSE 2-4 GM/100ML-% IV SOLN
2.0000 g | INTRAVENOUS | Status: AC
  Administered 2016-07-08: 2 g via INTRAVENOUS
  Filled 2016-07-08: qty 100

## 2016-07-08 MED ORDER — BUPROPION HCL ER (XL) 300 MG PO TB24
300.0000 mg | ORAL_TABLET | Freq: Every day | ORAL | Status: DC
  Administered 2016-07-09: 300 mg via ORAL
  Filled 2016-07-08: qty 1

## 2016-07-08 MED ORDER — CHLORHEXIDINE GLUCONATE 4 % EX LIQD: 60.0000 mL | Freq: Once | CUTANEOUS | Status: DC

## 2016-07-08 MED ORDER — ONDANSETRON HCL 4 MG/2ML IJ SOLN
INTRAMUSCULAR | Status: DC | PRN
  Administered 2016-07-08: 4 mg via INTRAVENOUS

## 2016-07-08 MED ORDER — METOCLOPRAMIDE HCL 5 MG PO TABS: 5.0000 mg | ORAL_TABLET | Freq: Three times a day (TID) | ORAL | Status: DC | PRN

## 2016-07-08 MED ORDER — METOCLOPRAMIDE HCL 5 MG/ML IJ SOLN: 5.0000 mg | Freq: Three times a day (TID) | INTRAMUSCULAR | Status: DC | PRN

## 2016-07-08 MED ORDER — BACLOFEN 10 MG PO TABS: 10.0000 mg | ORAL_TABLET | Freq: Every day | ORAL | Status: DC | PRN

## 2016-07-08 MED ORDER — TRAMADOL HCL 50 MG PO TABS
50.0000 mg | ORAL_TABLET | Freq: Four times a day (QID) | ORAL | Status: DC | PRN
  Administered 2016-07-09: 100 mg via ORAL
  Filled 2016-07-08: qty 2

## 2016-07-08 MED ORDER — ASPIRIN EC 325 MG PO TBEC
325.0000 mg | DELAYED_RELEASE_TABLET | Freq: Two times a day (BID) | ORAL | Status: DC
  Administered 2016-07-09: 325 mg via ORAL
  Filled 2016-07-08: qty 1

## 2016-07-08 MED ORDER — FLUVOXAMINE MALEATE 100 MG PO TABS
100.0000 mg | ORAL_TABLET | Freq: Every day | ORAL | Status: DC
  Administered 2016-07-09: 100 mg via ORAL
  Filled 2016-07-08: qty 1

## 2016-07-08 MED ORDER — LORAZEPAM 1 MG PO TABS
1.0000 mg | ORAL_TABLET | Freq: Four times a day (QID) | ORAL | Status: DC | PRN
  Administered 2016-07-09: 1 mg via ORAL
  Filled 2016-07-08: qty 1

## 2016-07-08 MED ORDER — PROPOFOL 10 MG/ML IV BOLUS
INTRAVENOUS | Status: AC
  Filled 2016-07-08: qty 20

## 2016-07-08 MED ORDER — ACETAMINOPHEN 650 MG RE SUPP: 650.0000 mg | Freq: Four times a day (QID) | RECTAL | Status: DC | PRN

## 2016-07-08 MED ORDER — MIDAZOLAM HCL 5 MG/5ML IJ SOLN
INTRAMUSCULAR | Status: DC | PRN
  Administered 2016-07-08: 2 mg via INTRAVENOUS

## 2016-07-08 MED ORDER — MENTHOL 3 MG MT LOZG: 1.0000 | LOZENGE | OROMUCOSAL | Status: DC | PRN

## 2016-07-08 MED ORDER — EPHEDRINE SULFATE-NACL 50-0.9 MG/10ML-% IV SOSY
PREFILLED_SYRINGE | INTRAVENOUS | Status: DC | PRN
  Administered 2016-07-08: 10 mg via INTRAVENOUS
  Administered 2016-07-08: 15 mg via INTRAVENOUS
  Administered 2016-07-08: 5 mg via INTRAVENOUS
  Administered 2016-07-08 (×2): 10 mg via INTRAVENOUS

## 2016-07-08 MED ORDER — HYDROCODONE-ACETAMINOPHEN 5-325 MG PO TABS: 1.0000 | ORAL_TABLET | Freq: Four times a day (QID) | ORAL | Status: DC | PRN

## 2016-07-08 MED ORDER — DOCUSATE SODIUM 100 MG PO CAPS
100.0000 mg | ORAL_CAPSULE | Freq: Two times a day (BID) | ORAL | Status: DC
  Administered 2016-07-08 – 2016-07-09 (×2): 100 mg via ORAL
  Filled 2016-07-08 (×2): qty 1

## 2016-07-08 MED ORDER — TEMAZEPAM 15 MG PO CAPS: 30.0000 mg | ORAL_CAPSULE | Freq: Every evening | ORAL | Status: DC | PRN

## 2016-07-08 MED ORDER — FENTANYL CITRATE (PF) 100 MCG/2ML IJ SOLN
25.0000 ug | INTRAMUSCULAR | Status: DC | PRN
  Administered 2016-07-08 (×3): 50 ug via INTRAVENOUS

## 2016-07-08 MED ORDER — PROPOFOL 10 MG/ML IV BOLUS
INTRAVENOUS | Status: DC | PRN
  Administered 2016-07-08: 160 mg via INTRAVENOUS

## 2016-07-08 MED ORDER — OXYCODONE-ACETAMINOPHEN 5-325 MG PO TABS
1.0000 | ORAL_TABLET | Freq: Four times a day (QID) | ORAL | Status: DC | PRN
  Administered 2016-07-08: 1 via ORAL
  Filled 2016-07-08: qty 1

## 2016-07-08 MED ORDER — ZONISAMIDE 50 MG PO CAPS
100.0000 mg | ORAL_CAPSULE | Freq: Once | ORAL | Status: AC
  Administered 2016-07-08: 100 mg via ORAL

## 2016-07-08 MED ORDER — FENTANYL CITRATE (PF) 250 MCG/5ML IJ SOLN
INTRAMUSCULAR | Status: DC | PRN
  Administered 2016-07-08: 50 ug via INTRAVENOUS
  Administered 2016-07-08: 100 ug via INTRAVENOUS
  Administered 2016-07-08 (×2): 25 ug via INTRAVENOUS

## 2016-07-08 MED ORDER — CEFAZOLIN SODIUM-DEXTROSE 2-4 GM/100ML-% IV SOLN
INTRAVENOUS | Status: AC
  Filled 2016-07-08: qty 100

## 2016-07-08 MED ORDER — MIDAZOLAM HCL 2 MG/2ML IJ SOLN
INTRAMUSCULAR | Status: AC
  Filled 2016-07-08: qty 2

## 2016-07-08 MED ORDER — ONDANSETRON HCL 4 MG/2ML IJ SOLN: 4.0000 mg | Freq: Four times a day (QID) | INTRAMUSCULAR | Status: DC | PRN

## 2016-07-08 MED ORDER — METHOCARBAMOL 1000 MG/10ML IJ SOLN
500.0000 mg | Freq: Four times a day (QID) | INTRAVENOUS | Status: DC | PRN
  Administered 2016-07-08: 500 mg via INTRAVENOUS
  Filled 2016-07-08: qty 5
  Filled 2016-07-08: qty 550

## 2016-07-08 SURGICAL SUPPLY — 36 items
BAG SPEC THK2 15X12 ZIP CLS (MISCELLANEOUS)
BAG ZIPLOCK 12X15 (MISCELLANEOUS) IMPLANT
BNDG GAUZE ELAST 4 BULKY (GAUZE/BANDAGES/DRESSINGS) ×2 IMPLANT
COVER PERINEAL POST (MISCELLANEOUS) ×2 IMPLANT
DRAPE STERI IOBAN 125X83 (DRAPES) ×2 IMPLANT
DRAPE U-SHAPE 47X51 STRL (DRAPES) ×2 IMPLANT
DRSG AQUACEL AG ADV 3.5X 6 (GAUZE/BANDAGES/DRESSINGS) ×2 IMPLANT
DURAPREP 26ML APPLICATOR (WOUND CARE) ×2 IMPLANT
ELECT REM PT RETURN 9FT ADLT (ELECTROSURGICAL) ×2
ELECTRODE REM PT RTRN 9FT ADLT (ELECTROSURGICAL) ×1 IMPLANT
GLOVE BIOGEL M 7.0 STRL (GLOVE) IMPLANT
GLOVE BIOGEL PI IND STRL 7.5 (GLOVE) ×1 IMPLANT
GLOVE BIOGEL PI IND STRL 8.5 (GLOVE) IMPLANT
GLOVE BIOGEL PI INDICATOR 7.5 (GLOVE) ×1
GLOVE BIOGEL PI INDICATOR 8.5 (GLOVE)
GLOVE ECLIPSE 8.0 STRL XLNG CF (GLOVE) IMPLANT
GLOVE ORTHO TXT STRL SZ7.5 (GLOVE) ×2 IMPLANT
GOWN STRL REUS W/TWL LRG LVL3 (GOWN DISPOSABLE) ×2 IMPLANT
GOWN STRL REUS W/TWL XL LVL3 (GOWN DISPOSABLE) IMPLANT
KIT BASIN OR (CUSTOM PROCEDURE TRAY) ×2 IMPLANT
LIQUID BAND (GAUZE/BANDAGES/DRESSINGS) ×2 IMPLANT
MANIFOLD NEPTUNE II (INSTRUMENTS) ×2 IMPLANT
NS IRRIG 1000ML POUR BTL (IV SOLUTION) ×2 IMPLANT
PACK GENERAL/GYN (CUSTOM PROCEDURE TRAY) ×2 IMPLANT
PIN THREADED GUIDE ACE (PIN) ×3 IMPLANT
POSITIONER SURGICAL ARM (MISCELLANEOUS) ×2 IMPLANT
SCREW CANN 8.0 100MM (Screw) ×1 IMPLANT
SCREW CANN 8.0 80MM (Screw) ×1 IMPLANT
SCREW CANN 8.0 85MM (Screw) ×2 IMPLANT
SCREW CANN 8.0 90MM (Screw) ×1 IMPLANT
SCREW CANN 8.0 95MM (Screw) ×1 IMPLANT
SUT MNCRL AB 4-0 PS2 18 (SUTURE) ×2 IMPLANT
SUT VIC AB 1 CT1 36 (SUTURE) ×1 IMPLANT
SUT VIC AB 2-0 CT1 27 (SUTURE) ×2
SUT VIC AB 2-0 CT1 TAPERPNT 27 (SUTURE) ×1 IMPLANT
TOWEL OR 17X26 10 PK STRL BLUE (TOWEL DISPOSABLE) ×2 IMPLANT

## 2016-07-08 NOTE — Transfer of Care (Signed)
Immediate Anesthesia Transfer of Care Note  Patient: Casey Diaz  Procedure(s) Performed: Procedure(s): LEFT CLOSED REDUCTION HIP AND PERCUTANEOUS SCREW (Left)  Patient Location: PACU  Anesthesia Type:General  Level of Consciousness: awake, alert  and oriented  Airway & Oxygen Therapy: Patient Spontanous Breathing and Patient connected to face mask oxygen  Post-op Assessment: Report given to RN and Post -op Vital signs reviewed and stable  Post vital signs: Reviewed and stable  Last Vitals:  Vitals:   07/08/16 1418  BP: 112/82  Pulse: 84  Resp: 16  Temp: 36.9 C    Last Pain:  Vitals:   07/08/16 1452  TempSrc:   PainSc: 7       Patients Stated Pain Goal: 4 (07/08/16 1452)  Complications: No apparent anesthesia complications

## 2016-07-08 NOTE — Brief Op Note (Signed)
07/08/2016  4:30 PM  PATIENT:  Casey Diaz  50 y.o. female  PRE-OPERATIVE DIAGNOSIS:  Left non-displaced Femoral Neck Fracture  POST-OPERATIVE DIAGNOSIS:  Left non-displaced Femoral Neck Fracture  PROCEDURE:  Procedure(s): LEFT CLOSED REDUCTION HIP AND PERCUTANEOUS SCREW (Left) hip fractrue  SURGEON:  Surgeon(s) and Role:    * Durene RomansMatthew Quamesha Mullet, MD - Primary  PHYSICIAN ASSISTANT: None  ANESTHESIA:   general  EBL:  <50cc  BLOOD ADMINISTERED:none  DRAINS: none   LOCAL MEDICATIONS USED:  NONE  SPECIMEN:  No Specimen  DISPOSITION OF SPECIMEN:  N/A  COUNTS:  YES  TOURNIQUET:  * No tourniquets in log *  DICTATION: .Other Dictation: Dictation Number [id number]  PLAN OF CARE: Admit to inpatient   PATIENT DISPOSITION:  PACU - hemodynamically stable.   Delay start of Pharmacological VTE agent (>24hrs) due to surgical blood loss or risk of bleeding: no

## 2016-07-08 NOTE — Anesthesia Preprocedure Evaluation (Addendum)
Anesthesia Evaluation  Patient identified by MRN, date of birth, ID band Patient awake    Reviewed: Allergy & Precautions, NPO status , Patient's Chart, lab work & pertinent test results  Airway Mallampati: II  TM Distance: >3 FB Neck ROM: Full    Dental no notable dental hx.    Pulmonary neg pulmonary ROS,    Pulmonary exam normal breath sounds clear to auscultation       Cardiovascular negative cardio ROS Normal cardiovascular exam Rhythm:Regular Rate:Normal     Neuro/Psych  Headaches, PSYCHIATRIC DISORDERS Anxiety Depression Cerebral palsy  Neuromuscular disease    GI/Hepatic negative GI ROS, Neg liver ROS,   Endo/Other  negative endocrine ROS  Renal/GU negative Renal ROS  negative genitourinary   Musculoskeletal negative musculoskeletal ROS (+)   Abdominal (+) + obese,   Peds negative pediatric ROS (+)  Hematology negative hematology ROS (+)   Anesthesia Other Findings   Reproductive/Obstetrics negative OB ROS                            Anesthesia Physical Anesthesia Plan  ASA: II  Anesthesia Plan: General   Post-op Pain Management:    Induction: Intravenous  Airway Management Planned: LMA  Additional Equipment:   Intra-op Plan:   Post-operative Plan: Extubation in OR  Informed Consent: I have reviewed the patients History and Physical, chart, labs and discussed the procedure including the risks, benefits and alternatives for the proposed anesthesia with the patient or authorized representative who has indicated his/her understanding and acceptance.   Dental advisory given  Plan Discussed with: CRNA  Anesthesia Plan Comments:        Anesthesia Quick Evaluation

## 2016-07-08 NOTE — Anesthesia Procedure Notes (Signed)
Procedure Name: LMA Insertion Performed by: Kenzlei Runions J Pre-anesthesia Checklist: Patient identified, Emergency Drugs available, Suction available, Patient being monitored and Timeout performed Patient Re-evaluated:Patient Re-evaluated prior to inductionOxygen Delivery Method: Circle system utilized Preoxygenation: Pre-oxygenation with 100% oxygen Intubation Type: IV induction Ventilation: Mask ventilation without difficulty LMA: LMA inserted LMA Size: 4.0 Number of attempts: 1 Placement Confirmation: positive ETCO2,  CO2 detector and breath sounds checked- equal and bilateral Tube secured with: Tape Dental Injury: Teeth and Oropharynx as per pre-operative assessment        

## 2016-07-08 NOTE — Interval H&P Note (Signed)
History and Physical Interval Note:  07/08/2016 4:20 PM  Casey Diaz  has presented today for surgery, with the diagnosis of Left Femoral Neck Fracture  The various methods of treatment have been discussed with the patient and family. After consideration of risks, benefits and other options for treatment, the patient has consented to  Procedure(s): LEFT CLOSED REDUCTION HIP AND PERCUTANEOUS SCREW (Left) as a surgical intervention .  The patient's history has been reviewed, patient examined, no change in status, stable for surgery.  I have reviewed the patient's chart and labs.  Questions were answered to the patient's satisfaction.     Shelda PalLIN,Ezreal Turay D

## 2016-07-09 ENCOUNTER — Encounter (HOSPITAL_COMMUNITY): Payer: Self-pay

## 2016-07-09 LAB — CBC
HCT: 38.3 % (ref 36.0–46.0)
HEMOGLOBIN: 12.9 g/dL (ref 12.0–15.0)
MCH: 31.9 pg (ref 26.0–34.0)
MCHC: 33.7 g/dL (ref 30.0–36.0)
MCV: 94.6 fL (ref 78.0–100.0)
Platelets: 302 10*3/uL (ref 150–400)
RBC: 4.05 MIL/uL (ref 3.87–5.11)
RDW: 12.9 % (ref 11.5–15.5)
WBC: 5.4 10*3/uL (ref 4.0–10.5)

## 2016-07-09 LAB — BASIC METABOLIC PANEL
Anion gap: 7 (ref 5–15)
BUN: 9 mg/dL (ref 6–20)
CALCIUM: 8.9 mg/dL (ref 8.9–10.3)
CO2: 24 mmol/L (ref 22–32)
CREATININE: 0.81 mg/dL (ref 0.44–1.00)
Chloride: 104 mmol/L (ref 101–111)
GLUCOSE: 140 mg/dL — AB (ref 65–99)
Potassium: 4.2 mmol/L (ref 3.5–5.1)
Sodium: 135 mmol/L (ref 135–145)

## 2016-07-09 MED ORDER — ASPIRIN 325 MG PO TBEC: 325.0000 mg | DELAYED_RELEASE_TABLET | Freq: Two times a day (BID) | ORAL | 0 refills | Status: DC

## 2016-07-09 MED ORDER — OXYCODONE-ACETAMINOPHEN 5-325 MG PO TABS: 1.0000 | ORAL_TABLET | Freq: Two times a day (BID) | ORAL | 0 refills | Status: DC | PRN

## 2016-07-09 MED ORDER — LIP MEDEX EX OINT
TOPICAL_OINTMENT | CUTANEOUS | Status: AC
  Administered 2016-07-09
  Filled 2016-07-09: qty 7

## 2016-07-09 MED ORDER — METHOCARBAMOL 500 MG PO TABS: 500.0000 mg | ORAL_TABLET | Freq: Four times a day (QID) | ORAL | 1 refills | Status: DC | PRN

## 2016-07-09 MED ORDER — TRAMADOL HCL 50 MG PO TABS: 50.0000 mg | ORAL_TABLET | Freq: Four times a day (QID) | ORAL | 0 refills | Status: DC | PRN

## 2016-07-09 MED ORDER — POLYETHYLENE GLYCOL 3350 17 G PO PACK: 17.0000 g | PACK | Freq: Every day | ORAL | 0 refills | Status: AC | PRN

## 2016-07-09 MED ORDER — DOCUSATE SODIUM 100 MG PO CAPS: 100.0000 mg | ORAL_CAPSULE | Freq: Two times a day (BID) | ORAL | 0 refills | Status: AC

## 2016-07-09 MED ORDER — INFLUENZA VAC SPLIT QUAD 0.5 ML IM SUSY: 0.5000 mL | PREFILLED_SYRINGE | INTRAMUSCULAR | Status: DC

## 2016-07-09 NOTE — Op Note (Signed)
[name], Casey Diaz NO.:  [id number]  MEDICAL RECORD NO.:  [id number]  LOCATION:  1618                         FACILITY:  [facility]  PHYSICIAN:  Madlyn Frankel. Charlann Boxer, M.D.  DATE OF BIRTH:  08-Jul-1966  DATE OF PROCEDURE:  07/08/2016 DATE OF DISCHARGE:                              OPERATIVE REPORT   PREOPERATIVE DIAGNOSIS:  Left nondisplaced femoral neck fracture, incomplete.  POSTOPERATIVE DIAGNOSIS:  Left nondisplaced femoral neck fracture, incomplete.  PROCEDURE:  Closed reduction and percutaneous cannulated screw fixation of left femoral neck fracture utilizing Biomet 8.0 mm screws.  SURGEON:  Madlyn Frankel. Charlann Boxer, M.D.  ASSISTANT:  Surgical team.  ANESTHESIA:  General.  SPECIMENS:  None.  COMPLICATIONS:  None.  BLOOD LOSS:  Minimal.  INDICATIONS FOR PROCEDURE:  [name] is a 50 year old female with right-sided hemiparetic cerebral palsy, who presented to the office for evaluation of increasing left hip pain after a fall about 2-3 weeks prior to presentation.  Plain radiographs did not indicate an obvious fracture; however, MRI revealed an incomplete fracture extending from the tension side of her femoral neck, vertically-oriented pattern that did not displace through the inferior aspect of her femoral neck.  Based on this, she was placed on limited restriction, weightbearing, then I saw her for surgical consideration.  I reviewed the risks, benefits, indications for the procedure.  Consent was obtained for stabilization of the fracture, allow her to heal and prevent propagation and further displacement.  PROCEDURE IN DETAIL:  The patient was brought to the operative theater. Once adequate anesthesia, preoperative antibiotics, Ancef administered, she was positioned supine on the fracture table.  Her right unaffected extremity was flexed and abducted out of the way with bony prominences padded.  The left foot was placed in the traction boot for  stabilization on the table.  Once appropriately positioned on the table with her arms appropriately positioned based on some flexion, contracture of the right upper extremity, we used fluoro to confirm maintenance of the anatomic position of her femoral neck.  Once this was confirmed, the left hip was prepped and draped in sterile fashion using shower curtain technique.  Fluoroscopy was brought back to the field again to identify landmarks.  With the landmarks identified on the skin and following a time-out, the left, an incision was made in the lateral side of her hip.  The iliotibial band was split.  The guidewires were then inserted in the inferior aspect of the neck and then 2 proximally; 1 anterior and 1 posterior.  I did recognize her anatomy to be little bit different than normal with very thin anterior-posterior proximal neck making a challenge to get the screw heads in here.  We measured the appropriate-sized screws, drilled the outer cortex and then placed the screws.  The guidewire was removed.  Final radiographs were obtained in AP and lateral planes.  She was then brought to the recovery room in stable condition, tolerated the procedure well.  Based on the above noted findings, I am going to have her be partial weightbearing until I clinically note that she is healing both radiographically as well as symptomatically.  Findings were reviewed with her husband.  Madlyn FrankelMatthew D. Charlann Boxerlin, M.D.     MDO/MEDQ  D:  07/08/2016  T:  07/09/2016  Job:  [id number]

## 2016-07-09 NOTE — Care Management Note (Signed)
Case Management Note  Patient Details  Name: Casey GriffithsJoy E Firman MRN: [id number] Date of Birth: 03/26/1966  Subjective/Objective:                  Left nondisplaced femoral neck fracture, incomplete.   Action/Plan: Closed reduction and percutaneous cannulated screw fixation of left femoral neck fracture utilizing Biomet 8.0 mm screws  Expected Discharge Date:                  Expected Discharge Plan:     In-House Referral:     Discharge planning Services   Home w Home Health  [facility]   Choice offered to:   patient  DME Arranged:   tub/shower bench or chair DME Agency:   [facility]  HH Arranged:   HHRN, HHOT, HHPT,  HH aide [facility]  Status of Service:   completed , sign off  If discussed at Long Length of Stay Meetings, dates discussed:    Additional Comments: Spoke to wife  and husband wife choice list given,  [facility] verifed that [employer] could be taken with Johny Drillingonna [facility] rep, after checking with intake she stated it was, pt set up with HHRn, HHPT, HHOT and HH aide.  Tub /shower bench delivered to room prior to discharge.  Verified address and phone numbers.  Husbands cel [phone].  No CM needs at this time.   Barnett Applebaumddison, Helix Lafontaine Jeannette, RN 07/09/2016, 4:46 PM

## 2016-07-09 NOTE — Evaluation (Signed)
Occupational Therapy Evaluation Patient Details Name: Casey Diaz MRN: [id number] DOB: Dec 29, 1965 Today's Date: 07/09/2016    History of Present Illness 50 yo female s/p IM nail L femur PMH  Past Medical History:  Diagnosis Date  . 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 Anxiety   . Cerebral palsy (HCC)    right arm/leg  . Cystocele   . Depression   . Headache   . Neuromuscular disorder (HCC)    Cerebral Palsy  . OCD (obsessive compulsive disorder)   . Osteoporosis   . Uterine prolaps       Clinical Impression   Patient is s/p iM nail L femure surgery resulting in functional limitations due to the deficits listed below (see OT problem list). Pt at baseline uses RW and L LE due to deficits on R side. Pt has baseline CP limiting R side use. Pt currently unable to maintain PWB L LE. Pt relies on L LE for weight bearing and concern that d/c home. Patient will benefit from skilled OT acutely to increase independence and safety with ADLS to allow discharge SNF. Pt very anxious this morning and requesting medication. Pt reports HA with light sensitivity. Pt comfortable in chair at this time with lights off and medications provided. RN to address anxiety medication.      Follow Up Recommendations  SNF;Supervision/Assistance - 24 hour    Equipment Recommendations  Tub/shower seat    Recommendations for Other Services       Precautions / Restrictions Precautions Precautions: Fall Restrictions Weight Bearing Restrictions: Yes LLE Weight Bearing: Partial weight bearing LLE Partial Weight Bearing Percentage or Pounds: 50      Mobility Bed Mobility Overal bed mobility: Needs Assistance Bed Mobility: Supine to Sit     Supine to sit: Supervision;HOB elevated     General bed mobility comments: pt requires use of bed rail and incr time. Pt with spasms and reports they are worse than baseline  Transfers Overall transfer level: Needs assistance Equipment  used: Rolling walker (2 wheeled) Transfers: Sit to/from [employer] Sit to Stand: Mod assist Stand pivot transfers: Mod assist       General transfer comment: pt unable to maintain PWB on L LE.     Balance Overall balance assessment: Needs assistance Sitting-balance support: Bilateral upper extremity supported;Feet supported Sitting balance-Leahy Scale: Fair     Standing balance support: Bilateral upper extremity supported;During functional activity Standing balance-Leahy Scale: Poor                              ADL Overall ADL's : Needs assistance/impaired Eating/Feeding: Modified independent   Grooming: Oral care;Wash/dry hands;Wash/dry face;Sitting;Modified independent                   Statistician: Stand-pivot;Moderate assistance;BSC Toilet Transfer Details (indicate cue type and reason): one person (A) to maintain PWB L LE. Pt relies heavily on L side at baseline Toileting- Clothing Manipulation and Hygiene: Minimal assistance;Sitting/lateral lean Toileting - Clothing Manipulation Details (indicate cue type and reason): pt able to lateral lean on 3n1       General ADL Comments: Pt unable to maintain weight bearing with stand pivot transfer with RW. pt with R LE elevated off floor and BIL LE spasms present throughout session     Vision     Perception     Praxis      Pertinent Vitals/Pain Pain Assessment:  0-10 Pain Score: 8  Pain Location: head / L hip Pain Descriptors / Indicators: Headache Pain Intervention(s): Limited activity within patient's tolerance;Monitored during session;Premedicated before session;Repositioned;RN gave pain meds during session     Hand Dominance Left   Extremity/Trunk Assessment Upper Extremity Assessment Upper Extremity Assessment: RUE deficits/detail RUE Deficits / Details: baseline CP with ridig movement in R UE. pt able to open and close hand for RW use   Lower Extremity  Assessment Lower Extremity Assessment: Defer to PT evaluation;RLE deficits/detail RLE Deficits / Details: baseline CP involvement   Cervical / Trunk Assessment Cervical / Trunk Assessment: Normal   Communication Communication Communication: No difficulties   Cognition Arousal/Alertness: Awake/alert Behavior During Therapy: WFL for tasks assessed/performed Overall Cognitive Status: Within Functional Limits for tasks assessed                     General Comments       Exercises       Shoulder Instructions      Home Living Family/patient expects to be discharged to:: Private residence Living Arrangements: Spouse/significant other Available Help at Discharge: Family;Available PRN/intermittently (will take few days ) Type of Home: House Home Access: Stairs to enter Entrance Stairs-Number of Steps: 3 Entrance Stairs-Rails: None Home Layout: Two level;Full bath on main level;Able to live on main level with bedroom/bathroom (preferred bedroom upstairs)   Alternate Level Stairs-Rails: Right Bathroom Shower/Tub: Producer, television/film/videoWalk-in shower   Bathroom Toilet: Standard     Home Equipment: Environmental consultantWalker - 2 wheels;Electric scooter          Prior Functioning/Environment Level of Independence: Independent                 OT Problem List: Decreased strength;Decreased activity tolerance;Impaired balance (sitting and/or standing);Decreased coordination;Decreased safety awareness;Decreased knowledge of use of DME or AE;Decreased knowledge of precautions;Pain   OT Treatment/Interventions: Self-care/ADL training;Therapeutic exercise;DME and/or AE instruction;Therapeutic activities;Patient/family education;Balance training    OT Goals(Current goals can be found in the care plan section) Acute Rehab OT Goals Patient Stated Goal: to return home  OT Goal Formulation: With patient Time For Goal Achievement: 07/23/16 Potential to Achieve Goals: Good  OT Frequency: Min 2X/week   Barriers to  D/C: Decreased caregiver support  spouse home for a few days and 409 yo son to care for       Co-evaluation              End of Session Equipment Utilized During Treatment: Gait belt;Rolling walker Nurse Communication: Mobility status;Precautions  Activity Tolerance: Patient tolerated treatment well Patient left: in chair;with call bell/phone within reach   Time: [id number] OT Time Calculation (min): 34 min Charges:  OT General Charges $OT Visit: 1 Procedure OT Evaluation $OT Eval Moderate Complexity: 1 Procedure OT Treatments $Self Care/Home Management : 8-22 mins G-Codes:    Boone MasterJones, Aleera Gilcrease B 07/09/2016, 9:27 AM  Mateo FlowJones, Brynn   OTR/L Pager: [phone]: [phone] Office: [phone] .

## 2016-07-09 NOTE — Discharge Instructions (Signed)
Partial weight bearing left lower extremity May shower when ever you would like Maintain surgical dressing until office visit Dressing is water proof

## 2016-07-09 NOTE — Anesthesia Postprocedure Evaluation (Signed)
Anesthesia Post Note  Patient: Kristene E Burningham  Procedure(s) Performed: Procedure(s) (LRB): LEFT CLOSED REDUCTION HIP AND PERCUTANEOUS SCREW (Left)  Patient location during evaluation: PACU Anesthesia Type: General Level of consciousness: awake and alert Pain management: pain level controlled Vital Signs Assessment: post-procedure vital signs reviewed and stable Respiratory status: spontaneous breathing, nonlabored ventilation, respiratory function stable and patient connected to nasal cannula oxygen Cardiovascular status: blood pressure returned to baseline and stable Postop Assessment: no signs of nausea or vomiting Anesthetic complications: no    Last Vitals:  Vitals:   07/09/16 0130 07/09/16 0509  BP: 112/74 115/77  Pulse: 91 81  Resp: 16 16  Temp: 37.1 C 37 C    Last Pain:  Vitals:   07/09/16 0509  TempSrc: Oral  PainSc:                  Ceola Para J

## 2016-07-09 NOTE — Progress Notes (Signed)
Nurse reviewed discharge instructions with pt and her husband.  Pt verbalized understanding of discharge instructions, follow up appointment and new medications.  Prescriptions given prior to discharge.  All questions answered prior to discharge.

## 2016-07-09 NOTE — Progress Notes (Signed)
Patient ID: Casey GriffithsJoy E Diaz, female   DOB: [birth year], 50 y.o.   MRN: [id number]   Subjective: 1 Day Post-Op Procedure(s) (LRB): LEFT CLOSED REDUCTION HIP AND PERCUTANEOUS SCREW (Left)    Patient reports pain as mild.  But fairly anxious this am due to being in just one position all night, ready to get moving.  Objective:   VITALS:   Vitals:   07/09/16 0130 07/09/16 0509  BP: 112/74 115/77  Pulse: 91 81  Resp: 16 16  Temp: 98.7 F (37.1 C) 98.6 F (37 C)    Neurovascular intact Incision: dressing C/D/I  LABS  Recent Labs  07/07/16 1030 07/09/16 0438  HGB 13.8 12.9  HCT 40.7 38.3  WBC 6.9 5.4  PLT 341 302     Recent Labs  07/09/16 0438  NA 135  K 4.2  BUN 9  CREATININE 0.81  GLUCOSE 140*    No results for input(s): LABPT, INR in the last 72 hours.   Assessment/Plan: 1 Day Post-Op Procedure(s) (LRB): LEFT CLOSED REDUCTION HIP AND PERCUTANEOUS SCREW (Left)   Up with therapy Discharge home   RTC in 2 weeks PWB LLE

## 2016-07-09 NOTE — Evaluation (Signed)
Physical Therapy Evaluation Patient Details Name: Casey Diaz MRN: [id number] DOB: 23-Jan-1966 Today's Date: 07/09/2016   History of Present Illness  50 yo female s/p IM nail L femur PMH  Clinical Impression  Pt admitted with above diagnosis. Pt currently with functional limitations due to the deficits listed below (see PT Problem List).  Pt will benefit from skilled PT to increase their independence and safety with mobility to allow discharge to the venue listed below.    Pt requiring grossly mod assist and second person for safety for basic mobility, she needs step by step cues for all tasks, partially d/t her high level of anxiety; DO NOT feel pt is safe to go home today but she has been D/C'd and husband is adamant that they go home as soon as possible; Strongly recommend pt have 24hr assist from trained caregivers, not family and friends; she is at risk for non-union d/t inability to maintain PWB as well risk for falls     Follow Up Recommendations Home health PT;Supervision/Assistance - 24 hour (recommend SNF, pt has been D/C'd)    Equipment Recommendations  Rolling walker with 5" wheels;3in1 (PT);Wheelchair (measurements PT)    Recommendations for Other Services       Precautions / Restrictions Precautions Precautions: Fall Restrictions Weight Bearing Restrictions: Yes LLE Weight Bearing: Partial weight bearing LLE Partial Weight Bearing Percentage or Pounds: 50      Mobility  Bed Mobility Overal bed mobility: Needs Assistance Bed Mobility: Supine to Sit     Supine to sit: Supervision;HOB elevated     General bed mobility comments:  (OOB with OT)  Transfers Overall transfer level: Needs assistance Equipment used: Rolling walker (2 wheeled) Transfers: Sit to/from Stand Sit to Stand: Min assist;+2 safety/equipment Stand pivot transfers: Mod assist       General transfer comment: max multi-modal cues for hand placement, LLE position and to  maintain PWB  LLE  Ambulation/Gait Ambulation/Gait assistance: Mod assist;+2 safety/equipment;+2 physical assistance Ambulation Distance (Feet): 4 Feet Assistive device: Rolling walker (2 wheeled) Gait Pattern/deviations: Step-to pattern   Gait velocity interpretation: Below normal speed for age/gender General Gait Details: max multi-modal cues for sequence, RW position, step length, wt shift to UEs  Stairs            Wheelchair Mobility    Modified Rankin (Stroke Patients Only)       Balance Overall balance assessment: Needs assistance Sitting-balance support: Bilateral upper extremity supported Sitting balance-Leahy Scale: Fair     Standing balance support: Bilateral upper extremity supported;During functional activity Standing balance-Leahy Scale: Poor                               Pertinent Vitals/Pain Pain Assessment: 0-10 Pain Score: 8  Pain Location: denies pain, only "nerves" Pain Descriptors / Indicators: Headache Pain Intervention(s): Monitored during session    Home Living Family/patient expects to be discharged to:: Private residence Living Arrangements: Spouse/significant other Available Help at Discharge: Family;Available PRN/intermittently Type of Home: House Home Access: Stairs to enter Entrance Stairs-Rails: None Entrance Stairs-Number of Steps: 1 Home Layout: Two level;Full bath on main level;Able to live on main level with bedroom/bathroom Home Equipment: Dan Humphreys - 2 wheels;Electric scooter      Prior Function Level of Independence: Independent               Hand Dominance   Dominant Hand: Left    Extremity/Trunk Assessment   Upper Extremity  Assessment: Defer to OT evaluation RUE Deficits / Details: baseline CP with ridig movement in R UE. pt able to open and close hand for RW use         Lower Extremity Assessment: LLE deficits/detail RLE Deficits / Details: baseline CP involvement, although grossly WFL for activities  tested LLE Deficits / Details: anticipated postop weakness;   Cervical / Trunk Assessment: Normal  Communication   Communication: No difficulties  Cognition Arousal/Alertness: Awake/alert Behavior During Therapy: Anxious (extremely, visible full body tremors/shaking) Overall Cognitive Status: Impaired/Different from baseline Area of Impairment: Problem solving             Problem Solving: Slow processing;Difficulty sequencing;Requires verbal cues;Requires tactile cues General Comments: appears pt and husband have diminished insight into the level of care pt will need at home    General Comments General comments (skin integrity, edema, etc.): incision dry and intact    Exercises Total Joint Exercises Ankle Circles/Pumps: AROM;Both;10 reps Quad Sets: AROM;Both;5 reps Heel Slides: 5 reps;AAROM;Left Hip ABduction/ADduction: AAROM;AROM;Left;10 reps   Assessment/Plan    PT Assessment Patient needs continued PT services  PT Problem List Decreased balance;Decreased activity tolerance;Decreased strength;Decreased range of motion;Decreased mobility;Decreased knowledge of use of DME;Decreased safety awareness          PT Treatment Interventions DME instruction;Gait training;Functional mobility training;Therapeutic activities;Therapeutic exercise;Patient/family education    PT Goals (Current goals can be found in the Care Plan section)  Acute Rehab PT Goals Patient Stated Goal: to return home (asap per pt husband) PT Goal Formulation: With patient/family Time For Goal Achievement: 07/16/16 Potential to Achieve Goals: Fair    Frequency 7X/week   Barriers to discharge        Co-evaluation               End of Session Equipment Utilized During Treatment: Gait belt Activity Tolerance: Other (comment);Patient limited by fatigue (anxiety) Patient left: in chair;with call bell/phone within reach;with chair alarm set           Time: 1110-1134 PT Time Calculation  (min) (ACUTE ONLY): 24 min   Charges:   PT Evaluation $PT Eval Low Complexity: 1 Procedure PT Treatments $Gait Training: 8-22 mins   PT G Codes:        Marielys Trinidad 07/09/2016, 11:49 AM

## 2016-07-09 NOTE — Progress Notes (Signed)
Physical Therapy Treatment Patient Details Name: Casey Diaz MRN: [id number] DOB: 1966-09-18 Today's Date: 07/09/2016    History of Present Illness 50 yo female s/p IM nail L femur PMH: CP, OCD    PT Comments    Pt continues to require mod to max assist for transfers; feel she is at risk for fx not healing/putting too much wt on LLE; at risk for falls as well; do not feel pt should D/C today but she has been D/C'd and husband states they need "to get out as soon as possible" have recommended pt does not do any amb at home until deemed safe by HHPT--should use w/c--RN made aware; requested as much support as possible for home as well as DME; CM aware  Follow Up Recommendations  Home health PT;Supervision/Assistance - 24 hour (pt D/c'd --rec'd SNF)     Equipment Recommendations  Rolling walker with 5" wheels;3in1 (PT);Wheelchair (measurements PT)    Recommendations for Other Services       Precautions / Restrictions Precautions Precautions: Fall Restrictions Weight Bearing Restrictions: Yes LLE Weight Bearing: Partial weight bearing LLE Partial Weight Bearing Percentage or Pounds: 50    Mobility  Bed Mobility Overal bed mobility:  (pt did not want to return to bed)                Transfers Overall transfer level: Needs assistance   Transfers: Sit to/from Stand;Stand Pivot Transfers Sit to Stand: Mod assist;+2 safety/equipment Stand pivot transfers: Mod assist;Max assist;+2 safety/equipment       General transfer comment: max multi-modal cues for hand placement, LLE position and to  maintain PWB LLE; husband instructed and performed standpivot x2  Ambulation/Gait                 Stairs            Wheelchair Mobility    Modified Rankin (Stroke Patients Only)       Balance Overall balance assessment: Needs assistance         Standing balance support: Bilateral upper extremity supported;During functional activity Standing balance-Leahy  Scale: Poor Standing balance comment: poor to zero--requires extensive assist without UE support                    Cognition Arousal/Alertness: Awake/alert Behavior During Therapy: Anxious Overall Cognitive Status: Impaired/Different from baseline Area of Impairment: Problem solving             Problem Solving: Slow processing;Difficulty sequencing;Requires verbal cues;Requires tactile cues General Comments: appears pt and husband have diminished insight into the level of care pt will need at home    Exercises      General Comments        Pertinent Vitals/Pain Pain Assessment: Faces Faces Pain Scale: Hurts little more Pain Location: L hip Pain Descriptors / Indicators: Grimacing;Guarding Pain Intervention(s): Limited activity within patient's tolerance;Monitored during session    Home Living                      Prior Function            PT Goals (current goals can now be found in the care plan section) Acute Rehab PT Goals Patient Stated Goal: to return home (asap per pt husband) PT Goal Formulation: With patient/family Time For Goal Achievement: 07/16/16 Potential to Achieve Goals: Fair Progress towards PT goals: Progressing toward goals (slowly)    Frequency    7X/week      PT Plan  Current plan remains appropriate    Co-evaluation             End of Session Equipment Utilized During Treatment: Gait belt Activity Tolerance: Other (comment);Patient limited by fatigue (anxiety) Patient left: in chair;with call bell/phone within reach;with chair alarm set;with family/visitor present     Time: 1200-1244 (less 4min outside of room for pt privacy) PT Time Calculation (min) (ACUTE ONLY): 44 min  Charges:  $Therapeutic Activity: 38-52 mins                    G Codes:      Donnah Levert 07/09/2016, 6:06 PM

## 2016-07-11 ENCOUNTER — Encounter (HOSPITAL_COMMUNITY): Payer: Self-pay | Admitting: Orthopedic Surgery

## 2016-07-18 NOTE — Discharge Summary (Signed)
Physician Discharge Summary  Patient ID: YIANNA TERSIGNI MRN: [id number] DOB/AGE: November 23, 1965 50 y.o.  Admit date: 07/08/2016 Discharge date: 07/09/2016   Procedures:  Procedure(s) (LRB): LEFT CLOSED REDUCTION HIP AND PERCUTANEOUS SCREW (Left)  Attending Physician:  Dr. Durene Romans   Admission Diagnoses:   Left femoral neck fracture   Discharge Diagnoses:  Active Problems:   Femoral neck fracture, left, closed, initial encounter  Past Medical History:  Diagnosis Date  . 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 Anxiety   . Cerebral palsy (HCC)    right arm/leg  . Cystocele   . Depression   . Headache   . Neuromuscular disorder (HCC)    Cerebral Palsy  . OCD (obsessive compulsive disorder)   . Osteoporosis   . Uterine prolaps     HPI:    Pt is a 50 y.o. female complaining of left hip pain for couple of weeks. Pain had continually increased since the beginning. X-rays / MRI in the clinic show left femoral neck fracture. Pt has tried various conservative treatments which have failed to alleviate their symptoms, including analgesic medications, assistance devices and activity modification. Various options are discussed with the patient. Risks, benefits and expectations were discussed with the patient. Patient understand the risks, benefits and expectations and wishes to proceed with surgery.  PCP: Gwen Pounds, MD   Discharged Condition: good  Hospital Course:  Patient underwent the above stated procedure on 07/08/2016. Patient tolerated the procedure well and brought to the recovery room in good condition and subsequently to the floor.  POD #1 BP: 115/77 ; Pulse: 81 ; Temp: 98.6 F (37 C) ; Resp: 16 Patient reports pain as mild.  But fairly anxious this am due to being in just one position all night, ready to get moving. Neurovascular intact and incision: dressing C/D/I.  LABS  Basename    HGB     12.9  HCT     38.3    Discharge Exam: General appearance:  alert, cooperative and no distress Extremities: Homans sign is negative, no sign of DVT, no edema, redness or tenderness in the calves or thighs and no ulcers, gangrene or trophic changes  Disposition: Home with follow up in 2 weeks   Follow-up Information    Shelda Pal, MD. Schedule an appointment as soon as possible for a visit in 2 week(s).   Specialty:  Orthopedic Surgery Contact information: [address] [place] [address] [phone]           Discharge Instructions    Call MD / Call 911    Complete by:  As directed    If you experience chest pain or shortness of breath, CALL 911 and be transported to the hospital emergency room.  If you develope a fever above 101 F, pus (white drainage) or increased drainage or redness at the wound, or calf pain, call your surgeon's office.   Constipation Prevention    Complete by:  As directed    Drink plenty of fluids.  Prune juice may be helpful.  You may use a stool softener, such as Colace (over the counter) 100 mg twice a day.  Use MiraLax (over the counter) for constipation as needed.   Diet - low sodium heart healthy    Complete by:  As directed    Discharge instructions    Complete by:  As directed    PWB LLE (50%)   Driving restrictions    Complete by:  As directed    No driving until further directed from office   Increase activity slowly as tolerated    Complete by:  As directed         Medication List    STOP taking these medications   cyclobenzaprine 10 MG tablet Commonly known as:  FLEXERIL     TAKE these medications   ADULT GUMMY PO Take 1 tablet by mouth daily.   aspirin 325 MG EC tablet Take 1 tablet (325 mg total) by mouth 2 (two) times daily.   baclofen 10 MG tablet Commonly known as:  LIORESAL Take 10 mg by mouth daily as needed for muscle spasms.   buPROPion 150 MG 24 hr tablet Commonly known as:  WELLBUTRIN XL Take 300 mg by mouth daily.   diclofenac 50 MG  tablet Commonly known as:  CATAFLAM Take 50 mg by mouth every 6 (six) hours as needed (For pain.).   docusate sodium 100 MG capsule Commonly known as:  COLACE Take 1 capsule (100 mg total) by mouth 2 (two) times daily.   fluticasone 50 MCG/ACT nasal spray Commonly known as:  FLONASE Place 2 sprays into the nose daily.   fluvoxaMINE 100 MG tablet Commonly known as:  LUVOX Take 100-300 mg by mouth See admin instructions. 100mg  in am, 300mg  in pm   hydrocortisone 2.5 % rectal cream Commonly known as:  ANUSOL-HC Place rectally 2 (two) times daily. x 7-14 days What changed:  how much to take  when to take this  reasons to take this  additional instructions   ketotifen 0.025 % ophthalmic solution Commonly known as:  ZADITOR Place 3 drops into both eyes 2 (two) times daily as needed (For allergies.).   LINZESS 145 MCG Caps capsule Generic drug:  linaclotide Take 145 mcg by mouth daily.   LORazepam 1 MG tablet Commonly known as:  ATIVAN Take 1 mg by mouth every 6 (six) hours as needed for anxiety.   methocarbamol 500 MG tablet Commonly known as:  ROBAXIN Take 1 tablet (500 mg total) by mouth every 6 (six) hours as needed for muscle spasms.   MIBELAS 24 FE 1-20 MG-MCG(24) Chew Generic drug:  Norethin Ace-Eth Estrad-FE Chew 1 tablet by mouth at bedtime.   montelukast 10 MG tablet Commonly known as:  SINGULAIR Take 10 mg by mouth daily.   nitrofurantoin 100 MG capsule Commonly known as:  MACRODANTIN Take 100 mg by mouth as needed (For urinary tract infection.).   oxyCODONE-acetaminophen 5-325 MG tablet Commonly known as:  PERCOCET/ROXICET Take 1-2 tablets by mouth every 12 (twelve) hours as needed for moderate pain or severe pain. USE AS BREAKTHROUGH PAIN MEDICATION, take benadryl if itching becomes worse   polyethylene glycol packet Commonly known as:  MIRALAX / GLYCOLAX Take 17 g by mouth daily as needed for mild constipation.   promethazine 25 MG  tablet Commonly known as:  PHENERGAN Take 25 mg by mouth every 4 (four) hours as needed for nausea or vomiting.   temazepam 30 MG capsule Commonly known as:  RESTORIL Take 30 mg by mouth at bedtime as needed for sleep.   traMADol 50 MG tablet Commonly known as:  ULTRAM Take 1-2 tablets (50-100 mg total) by mouth every 6 (six) hours as needed for moderate pain. What changed:  how much to take   zonisamide 50 MG capsule Commonly known as:  ZONEGRAN Take 100 mg by mouth at bedtime.        Signed: Anastasio Auerbach. Tayonna Bacha  PA-C  07/18/2016, 1:55 PM

## 2018-08-03 ENCOUNTER — Encounter (INDEPENDENT_AMBULATORY_CARE_PROVIDER_SITE_OTHER): Payer: Self-pay

## 2018-08-03 ENCOUNTER — Ambulatory Visit: Payer: 59 | Admitting: Psychiatry

## 2018-08-03 DIAGNOSIS — F422 Mixed obsessional thoughts and acts: Secondary | ICD-10-CM | POA: Diagnosis not present

## 2018-08-03 DIAGNOSIS — G809 Cerebral palsy, unspecified: Secondary | ICD-10-CM | POA: Insufficient documentation

## 2018-08-03 DIAGNOSIS — F332 Major depressive disorder, recurrent severe without psychotic features: Secondary | ICD-10-CM | POA: Diagnosis not present

## 2018-08-03 DIAGNOSIS — Z638 Other specified problems related to primary support group: Secondary | ICD-10-CM | POA: Insufficient documentation

## 2018-08-03 NOTE — Progress Notes (Signed)
[facility] Counselor/Therapist Progress Note   Patient ID: Casey Diaz, MRN: [id number]  Date: 08/03/2018  Timespent: 60 minutes Start time: 10:22a  Stop time: 11:22a  Treatment Type: Individual Therapy  Subjective:  Living with chronic stress fracture, daily experience of CP limitations, and loneliness.  Still located in [name], with son [name] and husband [name], employed there.  Depression and OCD steady, daily issues, including chronic intrusive thoughts of children being harmed and it being her fault, need to check to see if they are hiding in corners and darkened areas.  Sister Elita Quick is occupying PT and H's home in [place] but several months behind on rent agreement.  Been sending [name] job ideas but not exactly confronting dereliction of her promised duty to maintain and pay for the space.  Brother [name] is off meds, lying about it, and sister brought PT into confronting him, repeating an old pattern of the whole family recruiting PT as the family rescuer. Was able to tell [name] he needed to comply, that family would not be able to support him.  Still misses father, any time, any day, espceially as holidays come.  Used to be her rock, emotionally.  Wishes she could get TMS but turned down, allegedly on grounds that it is not approved for OCD, but this news was at least 2 years ago, by recollection..  Interventions:CBT, Solution Focused and Supportive Recommend try again looking into getting TMS approved for OCD.  Disc assertiveness challenges with [name], encouraged in leading with acceptance to have a difficult conversation, review the facts and promises made, confront her with the facts and ask her how she means to fulfill them.  If needed, let her know that PT herself AND husband, unified, are working on plan to sell the house.  Will see [name] in December -- recommend re-addressing alternative therapies then, if not before, including re-try TMS approval on grounds of chronic,  treatment-resistant depression, for which it is understood to be an approved treatment.  Mental Status Exam:   Appearance:   Casual     Behavior:  Appropriate  Motor:  Normal  Speech/Language:   Clear and Coherent  Affect:  Depressed  Mood:  depressed  Thought process:  Coherent  Thought content:    Rumination  Perceptual disturbances:    Normal  Orientation:  Full (Time, Place, and Person)  Attention:  Good  Concentration:  good  Memory:  WNL  Fund of knowledge:   Good  Insight:    Good  Judgment:   Good  Impulse Control:  fair    Reported Symptoms: ruminations about depression, intrusive thoughts of children being harmed, loneliness, depressed mood, pain  Risk Assessment: Danger to Self:  No Self-injurious Behavior: No Danger to Others: No Duty to Warn:no Physical Aggression / Violence:No  Access to Firearms a concern: No  Gang Involvement:No   Diagnosis:   ICD-10-CM   1. 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 Mixed obsessional thoughts and acts F42.2   3. Relationship problem with family member Z63.8    Multiple problems including siblings and mother    Plan:  . Re-try [employer] approval, as motivated, working with [name] . Maintain open, healthy communication with husband to strengthen partnership, combat loneliness . Apply advice to confronting sister . Seek posture as resource, not management, to brother . Continue to utilize previously learned skills ad lib . Maintain medication, if prescribed, and work faithfully with relevant prescriber(s) .  Call the clinic on-call service, present to ER, or call 911 if any life-threatening emergency . Follow up with me as able   Robley Fries, PhD

## 2018-10-01 ENCOUNTER — Encounter: Payer: Self-pay | Admitting: Emergency Medicine

## 2018-10-01 DIAGNOSIS — H3340 Traction detachment of retina, unspecified eye: Secondary | ICD-10-CM | POA: Insufficient documentation

## 2018-10-12 ENCOUNTER — Ambulatory Visit: Payer: 59 | Admitting: Psychiatry

## 2018-10-12 ENCOUNTER — Encounter: Payer: Self-pay | Admitting: Psychiatry

## 2018-10-12 DIAGNOSIS — F339 Major depressive disorder, recurrent, unspecified: Secondary | ICD-10-CM

## 2018-10-12 DIAGNOSIS — F422 Mixed obsessional thoughts and acts: Secondary | ICD-10-CM

## 2018-10-12 NOTE — Patient Instructions (Signed)
Options for TMS: [facility], [facility] and the other [facility].  Options for bladder frequency:  Oxybutynin, other the additional risk of some sleepiness the drug to drug interactions are really not problematic with Wellbutrin nor fluvoxamine despite what some sites like [employer] say.  Raising the blood level of fluvoxamine is not problematic.  Oxybutynin is considered the treatment of choice according to the up-to-date website.  The following is from their site: Antimuscarinic agents-These agents have direct antispasmodic effects and inhibit the action of acetylcholine on smooth muscle. Oxybutynin is the most commonly used; other agents include tolterodine and solifenacin. All antimuscarinic agents have anticholinergic effects which include dry mouth, constipation, blurred vision for near objects, tachycardia, drowsiness, and decreased cognitive function. These agents are contraindicated in patients with uncontrolled tachyarrhythmias, myasthenia gravis, gastric retention, and narrow angle-closure glaucoma and are discouraged in patients with cognitive impairment. Addition concerns with antimuscarinic agents include increased risk of gastric ulceration in patients taking slow-release potassium chloride due to delayed gastric emptying [92] and increased risk of urinary retention in patients with severe BPH symptoms [53]. Oxybutynin is available in immediate release (IR), extended release (ER), and transdermal formulations. We recommend the ER formulation as it has fewer side effects (particularly less dry mouth), and a better safety profile. While most individuals benefit from oxybutynin 10 mg ER, some patients will still have benefit at a lower dose (5 mg ER), and rarely will patients require 20 or 30 mg ER [73,74]. The effect of antimuscarinic agents on nocturia are small with reduction of nocturia by approximately 0.30 episodes per night compared with placebo (no reduction)  [72,74,87,93,94]. Antimuscarinic agents-These agents have direct antispasmodic effects and inhibit the action of acetylcholine on smooth muscle. Oxybutynin is the most commonly used; other agents include tolterodine and solifenacin. All antimuscarinic agents have anticholinergic effects which include dry mouth, constipation, blurred vision for near objects, tachycardia, drowsiness, and decreased cognitive function. These agents are contraindicated in patients with uncontrolled tachyarrhythmias, myasthenia gravis, gastric retention, and narrow angle-closure glaucoma and are discouraged in patients with cognitive impairment. Addition concerns with antimuscarinic agents include increased risk of gastric ulceration in patients taking slow-release potassium chloride due to delayed gastric emptying [92] and increased risk of urinary retention in patients with severe BPH symptoms [53]. Oxybutynin is available in immediate release (IR), extended release (ER), and transdermal formulations. We recommend the ER formulation as it has fewer side effects (particularly less dry mouth), and a better safety profile. While most individuals benefit from oxybutynin 10 mg ER, some patients will still have benefit at a lower dose (5 mg ER), and rarely will patients require 20 or 30 mg ER [73,74]. The effect of antimuscarinic agents on nocturia are small with reduction of nocturia by approximately 0.30 episodes per night compared with placebo (no reduction) [72,74,87,93,94].

## 2018-10-12 NOTE — Progress Notes (Signed)
Casey Diaz [id number] 26-Mar-1966 52 y.o.  Subjective:   Patient ID:  Casey Diaz is a 52 y.o. (DOB [birth year]) female.  Chief Complaint:  Chief Complaint  Patient presents with  . Follow-up    Medication Management    HPI Casey Diaz presents to the office today for follow-up of OCD and severe anxiety.  Lives in [place] and back for followup.  Sx are about the same.  Has to take meds with different sizes.  Pt reports that mood is Anxious and Depressed and describes anxiety as Severe. Anxiety symptoms include: Excessive Worry, Obsessive Compulsive Symptoms:   Checking,,. Pt reports has interrupted sleep and nocturia. Pt reports that appetite is good. Pt reports that energy is no change and down slightly. Concentration is down slightly. Suicidal thoughts:  denied by patient.  Also asks about nocturia med.  Gets back to sleep quickly but doesn't like getting up.  Feels tired in the am and needs to nap in the morning.  Low energy.  Loves the environment of [place] but misses some things there.  She's not able to work there.  H works there and likes it.  Struggled with not working, feels isolated and not up to task of meeting people.  Does attend a church and met a friend who's been helpful.  Leaving for [place] on 10/16/18.  Previous psych med trials include Prozac, paroxetine, sertraline, venlafaxine, Anafranil with no response, Geodon, Topamax, risperidone, Rexulti, Wellbutrin, lamotrigine, Abilify, pindolol, Seroquel, lithium, BuSpar, Namenda, Viibryd, Wellbutrin, pramipexole with no response, and Latuda 40 mg with irritability. Review of Systems:  Review of Systems  Constitutional: Positive for fatigue.  Musculoskeletal: Positive for back pain and neck pain.  Neurological: Positive for tremors and weakness.       History of CP  Psychiatric/Behavioral: Positive for dysphoric mood and sleep disturbance. Negative for agitation, behavioral problems, confusion, decreased  concentration, hallucinations, self-injury and suicidal ideas. The patient is nervous/anxious. The patient is not hyperactive.     Medications: I have reviewed the patient's current medications.  Current Outpatient Medications  Medication Sig Dispense Refill  . baclofen (LIORESAL) 10 MG tablet Take 10 mg by mouth daily as needed for muscle spasms.     Marland Kitchen buPROPion (WELLBUTRIN) 100 MG tablet Take 100 mg by mouth. 2 in AM and 1 in PM    . diclofenac sodium (VOLTAREN) 1 % GEL Apply 2 g topically 4 (four) times daily.    Marland Kitchen docusate sodium (COLACE) 100 MG capsule Take 1 capsule (100 mg total) by mouth 2 (two) times daily. 10 capsule 0  . fluticasone (FLONASE) 50 MCG/ACT nasal spray Place 2 sprays into the nose daily.    . fluvoxaMINE (LUVOX) 50 MG tablet Take 300 mg by mouth at bedtime.    Marland Kitchen ketotifen (ZADITOR) 0.025 % ophthalmic solution Place 3 drops into both eyes 2 (two) times daily as needed (For allergies.).     Marland Kitchen LINZESS 145 MCG CAPS capsule Take 145 mcg by mouth daily.    Marland Kitchen LORazepam (ATIVAN) 1 MG tablet Take 1 mg by mouth every 6 (six) hours as needed for anxiety.     . Multiple Vitamins-Minerals (ADULT GUMMY PO) Take 1 tablet by mouth daily.    . naproxen sodium (ALEVE) 220 MG tablet Take 220 mg by mouth.    . nitrofurantoin (MACRODANTIN) 100 MG capsule Take 100 mg by mouth as needed (For urinary tract infection.).     Marland Kitchen polyethylene glycol (MIRALAX / GLYCOLAX) packet Take 17  g by mouth daily as needed for mild constipation. 14 each 0  . temazepam (RESTORIL) 30 MG capsule Take 30 mg by mouth at bedtime as needed for sleep.     Marland Kitchen aspirin EC 325 MG EC tablet Take 1 tablet (325 mg total) by mouth 2 (two) times daily. (Patient not taking: Reported on 10/12/2018) 60 tablet 0  . hydrocortisone (ANUSOL-HC) 2.5 % rectal cream Place rectally 2 (two) times daily. x 7-14 days (Patient taking differently: Place 1 application rectally as needed for hemorrhoids or itching. ) 30 g 0  . methocarbamol  (ROBAXIN) 500 MG tablet Take 1 tablet (500 mg total) by mouth every 6 (six) hours as needed for muscle spasms. (Patient not taking: Reported on 10/12/2018) 60 tablet 1  . MIBELAS 24 FE 1-20 MG-MCG(24) CHEW Chew 1 tablet by mouth at bedtime.     . montelukast (SINGULAIR) 10 MG tablet Take 10 mg by mouth daily.     Marland Kitchen oxyCODONE-acetaminophen (PERCOCET/ROXICET) 5-325 MG tablet Take 1-2 tablets by mouth every 12 (twelve) hours as needed for moderate pain or severe pain. USE AS BREAKTHROUGH PAIN MEDICATION, take benadryl if itching becomes worse (Patient not taking: Reported on 10/12/2018) 20 tablet 0  . promethazine (PHENERGAN) 25 MG tablet Take 25 mg by mouth every 4 (four) hours as needed for nausea or vomiting.    . topiramate (TOPAMAX) 100 MG tablet Take 100 mg by mouth daily.    . traMADol (ULTRAM) 50 MG tablet Take 1-2 tablets (50-100 mg total) by mouth every 6 (six) hours as needed for moderate pain. (Patient not taking: Reported on 10/12/2018) 60 tablet 0  . zonisamide (ZONEGRAN) 50 MG capsule Take 100 mg by mouth at bedtime.     No current facility-administered medications for this visit.     Medication Side Effects: None  Allergies:  Allergies  Allergen Reactions  . Hydrocodone Itching  . Sulfamethoxazole-Trimethoprim Itching  . Dust Mite Extract Other (See Comments)    Sneezing, watery eyes, runny nose  . Latex Itching  . Other Other (See Comments)    PT IS ALLERGIC TO CAT DANDER AND RAGWEED - Sneezing, watery eyes, runny nose   . Pollen Extract Other (See Comments)    Sneezing, watery eyes, runny nose     Past Medical History:  Diagnosis Date  . 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 Anxiety   . Cerebral palsy (HCC)    right arm/leg  . Cystocele   . Depression   . Headache   . Neuromuscular disorder (HCC)    Cerebral Palsy  . OCD (obsessive compulsive disorder)   . Osteoporosis   . Uterine prolaps     Family History  Problem Relation Age of Onset   . Cancer Father        skin AND LUNG  . Alcohol abuse Sister        CRACK COCAINE    Social History   Socioeconomic History  . Marital status: Married    Spouse name: Not on file  . Number of children: Not on file  . Years of education: Not on file  . Highest education level: Not on file  Occupational History  . Not on file  Social Needs  . Financial resource strain: Not on file  . Food insecurity:    Worry: Not on file    Inability: Not on file  . Transportation needs:    Medical: Not on file    Non-medical: Not on file  Tobacco Use  . Smoking status: Never Smoker  . Smokeless tobacco: Never Used  Substance and Sexual Activity  . Alcohol use: Yes    Comment: OCCASIONAL beer  . Drug use: No  . Sexual activity: Yes    Birth control/protection: Pill    Comment: LOESTRIN 24 FE  Lifestyle  . Physical activity:    Days per week: Not on file    Minutes per session: Not on file  . Stress: Not on file  Relationships  . Social connections:    Talks on phone: Not on file    Gets together: Not on file    Attends religious service: Not on file    Active member of club or organization: Not on file    Attends meetings of clubs or organizations: Not on file    Relationship status: Not on file  . Intimate partner violence:    Fear of current or ex partner: Not on file    Emotionally abused: Not on file    Physically abused: Not on file    Forced sexual activity: Not on file  Other Topics Concern  . Not on file  Social History Narrative  . Not on file    Past Medical History, Surgical history, Social history, and Family history were reviewed and updated as appropriate.   Please see review of systems for further details on the patient's review from today.   Objective:   Physical Exam:  There were no vitals taken for this visit.  Physical Exam Constitutional:      General: She is not in acute distress.    Appearance: Normal appearance. She is well-developed.   Musculoskeletal:        General: No deformity.  Neurological:     Mental Status: She is alert and oriented to person, place, and time.     Motor: Weakness and tremor present.     Coordination: Coordination normal.     Gait: Gait abnormal.  Psychiatric:        Attention and Perception: Attention normal.        Mood and Affect: Mood is anxious and depressed. Affect is not labile, blunt, angry or inappropriate.        Speech: Speech normal.        Behavior: Behavior normal.        Thought Content: Thought content normal. Thought content does not include homicidal or suicidal ideation. Thought content does not include homicidal or suicidal plan.        Cognition and Memory: Cognition normal.        Judgment: Judgment normal.     Comments: Insight intact. No auditory or visual hallucinations. No delusions.      Lab Review:     Component Value Date/Time   NA 135 07/09/2016 0438   K 4.2 07/09/2016 0438   CL 104 07/09/2016 0438   CO2 24 07/09/2016 0438   GLUCOSE 140 (H) 07/09/2016 0438   BUN 9 07/09/2016 0438   CREATININE 0.81 07/09/2016 0438   CALCIUM 8.9 07/09/2016 0438   GFRNONAA >60 07/09/2016 0438   GFRAA >60 07/09/2016 0438       Component Value Date/Time   WBC 5.4 07/09/2016 0438   RBC 4.05 07/09/2016 0438   HGB 12.9 07/09/2016 0438   HCT 38.3 07/09/2016 0438   PLT 302 07/09/2016 0438   MCV 94.6 07/09/2016 0438   MCH 31.9 07/09/2016 0438   MCHC 33.7 07/09/2016 0438   RDW 12.9 07/09/2016 0438  No results found for: POCLITH, LITHIUM   No results found for: PHENYTOIN, PHENOBARB, VALPROATE, CBMZ   .res Assessment: Plan:    Mixed obsessional thoughts and acts  Recurrent major depression resistant to treatment ([place])   Options for TR sx.  Both Dx are TR and marked.  Impaired.  There appear to be no reasonable psychiatric medication options that remain for her.  Perhaps some previous failed meds could be tried in high dosages.  Answered questions about ketamine  and TMS in detail.  Ketamine not very available at this time but might help both.  TMS more available but no TMS coil for OCD in [place].  We discussed side effects of ketamine and TMS.  answered questions about meds for nocturia and PCP concerns over DDI.  Extensive discusssion and review of Up-to -Date site and [facility] with there.  It appears that the usual treatment of choice which is oxybutynin would be a reasonable alternative.   Ativan and luvox could contribute to the fatigue but these appear necessary.  We discussed the short-term risks associated with benzodiazepines including sedation and increased fall risk among others.  Discussed long-term side effect risk including dependence, potential withdrawal symptoms, and the potential eventual dose-related risk of dementia.  This appt was 30 mins.  She will follow-up on an as-needed basis as she is primarily being followed in [place] where she lives now.  She may come back for an additional appointment at times.  She will consider the [facility].  Lynder Parents, MD, DFAPA  Please see After Visit Summary for patient specific instructions.  No future appointments.  No orders of the defined types were placed in this encounter.     -------------------------------

## 2020-04-09 ENCOUNTER — Telehealth (INDEPENDENT_AMBULATORY_CARE_PROVIDER_SITE_OTHER): Payer: 59 | Admitting: Psychiatry

## 2020-04-09 ENCOUNTER — Encounter: Payer: Self-pay | Admitting: Psychiatry

## 2020-04-09 DIAGNOSIS — F339 Major depressive disorder, recurrent, unspecified: Secondary | ICD-10-CM

## 2020-04-09 DIAGNOSIS — F332 Major depressive disorder, recurrent severe without psychotic features: Secondary | ICD-10-CM

## 2020-04-09 DIAGNOSIS — F422 Mixed obsessional thoughts and acts: Secondary | ICD-10-CM | POA: Diagnosis not present

## 2020-04-09 NOTE — Progress Notes (Signed)
Casey Diaz [id number] 1965/11/30 54 y.o.  Video Visit via My Chart  I connected with pt by My Chart and verified that I am speaking with the correct person using two identifiers.   I discussed the limitations, risks, security and privacy concerns of performing an evaluation and management service by My Chart  and the availability of in person appointments. I also discussed with the patient that there may be a patient responsible charge related to this service. The patient expressed understanding and agreed to proceed.  I discussed the assessment and treatment plan with the patient. The patient was provided an opportunity to ask questions and all were answered. The patient agreed with the plan and demonstrated an understanding of the instructions.   The patient was advised to call back or seek an in-person evaluation if the symptoms worsen or if the condition fails to improve as anticipated.  I provided 30 minutes of video time during this encounter.  The patient was located at home and the provider was located office.  Call started at 445 and ended at 530  Subjective:   Patient ID:  Casey Diaz is a 54 y.o. (DOB 1966-07-18) female.  Chief Complaint:  Chief Complaint  Patient presents with  . Follow-up  . Depression  . Anxiety  . Sleeping Problem     HPI [name] presents to the office today for follow-up of OCD and severe anxiety.    December 2019 visit the following was noted: No meds were changed. Lives in [place] and back for followup.  Sx are about the same.  Has to take meds with different sizes. Pt reports that mood is Anxious and Depressed and describes anxiety as Severe. Anxiety symptoms include: Excessive Worry, Obsessive Compulsive Symptoms:   Checking,,. Pt reports has interrupted sleep and nocturia. Pt reports that appetite is good. Pt reports that energy is no change and down slightly. Concentration is down slightly. Suicidal thoughts:  denied by  patient. Loves the environment of [place] but misses some things there.  She's not able to work there.  H works there and likes it.  Struggled with not working, feels isolated and not up to task of meeting people.  Does attend a church and met a friend who's been helpful.  Leaving for [place] on 10/16/18.  04/09/2020 appointment the following is noted: Staying another year in [place] bc Covid and other things. Last few months a lot of crying spells.  Is in menopause. Wonders about med changes though is nervous about it.  Crying spells associated with depressing thoughts more than stress or OCD.   Covid really hard on everyone and couldn't see family for 18 mos.  Family still very dysfunctional. No close friends in part due to OCD and depression. Son high Autism spectrum with ADHD and anxiety and she's with him all the time. Greater health problems with CP so more pains.  Previous psych med trials include Prozac, paroxetine, sertraline, fluvoxamine, venlafaxine, Anafranil with no response,  Wellbutrin, , Viibryd,  Geodon,  risperidone, Rexulti, Abilify,  Seroquel, Latuda 40 mg with irritability. lamotrigine lithium, BuSpar, Namenda,  pramipexole with no response, and Topamax, pindolol  Review of Systems:  Review of Systems  Musculoskeletal: Positive for arthralgias and gait problem.  Neurological: Positive for weakness. Negative for tremors.    Medications: I have reviewed the patient's current medications.  Current Outpatient Medications  Medication Sig Dispense Refill  . aspirin EC 325 MG EC tablet Take 1 tablet (325 mg total)  by mouth 2 (two) times daily. 60 tablet 0  . baclofen (LIORESAL) 10 MG tablet Take 10 mg by mouth daily as needed for muscle spasms.     Marland Kitchen buPROPion (WELLBUTRIN XL) 150 MG 24 hr tablet Take 450 mg by mouth daily.    . diclofenac sodium (VOLTAREN) 1 % GEL Apply 2 g topically 4 (four) times daily.    Marland Kitchen docusate sodium (COLACE) 100 MG capsule Take 1 capsule (100  mg total) by mouth 2 (two) times daily. 10 capsule 0  . fluticasone (FLONASE) 50 MCG/ACT nasal spray Place 2 sprays into the nose daily.    . fluvoxaMINE (LUVOX) 50 MG tablet 6 at night    . hydrocortisone (ANUSOL-HC) 2.5 % rectal cream Place rectally 2 (two) times daily. x 7-14 days (Patient taking differently: Place 1 application rectally as needed for hemorrhoids or itching. ) 30 g 0  . ketotifen (ZADITOR) 0.025 % ophthalmic solution Place 3 drops into both eyes 2 (two) times daily as needed (For allergies.).     Marland Kitchen LINZESS 145 MCG CAPS capsule Take 145 mcg by mouth daily.    Marland Kitchen LORazepam (ATIVAN) 1 MG tablet Take 1 mg by mouth every 6 (six) hours as needed for anxiety.     . methocarbamol (ROBAXIN) 500 MG tablet Take 1 tablet (500 mg total) by mouth every 6 (six) hours as needed for muscle spasms. 60 tablet 1  . MIBELAS 24 FE 1-20 MG-MCG(24) CHEW Chew 1 tablet by mouth at bedtime.     . montelukast (SINGULAIR) 10 MG tablet Take 10 mg by mouth daily.     . Multiple Vitamins-Minerals (ADULT GUMMY PO) Take 1 tablet by mouth daily.    . naproxen sodium (ALEVE) 220 MG tablet Take 220 mg by mouth.    . nitrofurantoin (MACRODANTIN) 100 MG capsule Take 100 mg by mouth as needed (For urinary tract infection.).     Marland Kitchen oxyCODONE-acetaminophen (PERCOCET/ROXICET) 5-325 MG tablet Take 1-2 tablets by mouth every 12 (twelve) hours as needed for moderate pain or severe pain. USE AS BREAKTHROUGH PAIN MEDICATION, take benadryl if itching becomes worse 20 tablet 0  . polyethylene glycol (MIRALAX / GLYCOLAX) packet Take 17 g by mouth daily as needed for mild constipation. 14 each 0  . promethazine (PHENERGAN) 25 MG tablet Take 25 mg by mouth every 4 (four) hours as needed for nausea or vomiting.    . temazepam (RESTORIL) 30 MG capsule Take 30 mg by mouth at bedtime as needed for sleep.     Marland Kitchen topiramate (TOPAMAX) 100 MG tablet Take 100 mg by mouth daily.    . traMADol (ULTRAM) 50 MG tablet Take 1-2 tablets (50-100 mg  total) by mouth every 6 (six) hours as needed for moderate pain. 60 tablet 0  . zonisamide (ZONEGRAN) 50 MG capsule Take 100 mg by mouth at bedtime.     No current facility-administered medications for this visit.    Medication Side Effects: None   Allergies:  Allergies  Allergen Reactions  . Hydrocodone Itching  . Sulfamethoxazole-Trimethoprim Itching  . Dust Mite Extract Other (See Comments)    Sneezing, watery eyes, runny nose  . Latex Itching  . Other Other (See Comments)    PT IS ALLERGIC TO CAT DANDER AND RAGWEED - Sneezing, watery eyes, runny nose   . Pollen Extract Other (See Comments)    Sneezing, watery eyes, runny nose     Past Medical History:  Diagnosis Date  . Abnormal Pap smear 2011  hpv/mild dysplasia,cin1  . Anxiety   . Cerebral palsy (HCC)    right arm/leg  . Cystocele   . Depression   . Headache   . Neuromuscular disorder (HCC)    Cerebral Palsy  . OCD (obsessive compulsive disorder)   . Osteoporosis   . Uterine prolaps     Family History  Problem Relation Age of Onset  . Cancer Father        skin AND LUNG  . Alcohol abuse Sister        CRACK COCAINE    Social History   Socioeconomic History  . Marital status: Married    Spouse name: Not on file  . Number of children: Not on file  . Years of education: Not on file  . Highest education level: Not on file  Occupational History  . Not on file  Tobacco Use  . Smoking status: Never Smoker  . Smokeless tobacco: Never Used  Substance and Sexual Activity  . Alcohol use: Yes    Comment: OCCASIONAL beer  . Drug use: No  . Sexual activity: Yes    Birth control/protection: Pill    Comment: LOESTRIN 24 FE  Other Topics Concern  . Not on file  Social History Narrative  . Not on file   Social Determinants of Health   Financial Resource Strain:   . Difficulty of Paying Living Expenses:   Food Insecurity:   . Worried About Charity fundraiser in the Last Year:   . Arboriculturist in  the Last Year:   Transportation Needs:   . Film/video editor (Medical):   Marland Kitchen Lack of Transportation (Non-Medical):   Physical Activity:   . Days of Exercise per Week:   . Minutes of Exercise per Session:   Stress:   . Feeling of Stress :   Social Connections:   . Frequency of Communication with Friends and Family:   . Frequency of Social Gatherings with Friends and Family:   . Attends Religious Services:   . Active Member of Clubs or Organizations:   . Attends Archivist Meetings:   Marland Kitchen Marital Status:   Intimate Partner Violence:   . Fear of Current or Ex-Partner:   . Emotionally Abused:   Marland Kitchen Physically Abused:   . Sexually Abused:     Past Medical History, Surgical history, Social history, and Family history were reviewed and updated as appropriate.   Please see review of systems for further details on the patient's review from today.   Objective:   Physical Exam:  There were no vitals taken for this visit.  Physical Exam Neurological:     Mental Status: She is alert and oriented to person, place, and time.     Cranial Nerves: No dysarthria.  Psychiatric:        Attention and Perception: Attention and perception normal.        Mood and Affect: Mood is anxious and depressed. Affect is tearful.        Speech: Speech normal.        Behavior: Behavior is cooperative.        Thought Content: Thought content is not paranoid or delusional. Thought content does not include homicidal or suicidal ideation. Thought content does not include homicidal or suicidal plan.        Cognition and Memory: Cognition and memory normal.        Judgment: Judgment normal.     Comments: Insight intact Chronic obsessions and compulsions  at baseline which is significant     Lab Review:     Component Value Date/Time   NA 135 07/09/2016 0438   K 4.2 07/09/2016 0438   CL 104 07/09/2016 0438   CO2 24 07/09/2016 0438   GLUCOSE 140 (H) 07/09/2016 0438   BUN 9 07/09/2016 0438    CREATININE 0.81 07/09/2016 0438   CALCIUM 8.9 07/09/2016 0438   GFRNONAA >60 07/09/2016 0438   GFRAA >60 07/09/2016 0438       Component Value Date/Time   WBC 5.4 07/09/2016 0438   RBC 4.05 07/09/2016 0438   HGB 12.9 07/09/2016 0438   HCT 38.3 07/09/2016 0438   PLT 302 07/09/2016 0438   MCV 94.6 07/09/2016 0438   MCH 31.9 07/09/2016 0438   MCHC 33.7 07/09/2016 0438   RDW 12.9 07/09/2016 0438    No results found for: POCLITH, LITHIUM   No results found for: PHENYTOIN, PHENOBARB, VALPROATE, CBMZ   .res Assessment: Plan:    Breta was seen today for follow-up, depression, anxiety and sleeping problem.  Diagnoses and all orders for this visit:  Mixed obsessional thoughts and acts  Recurrent major depression resistant to treatment ([place])  Severe recurrent major depression without psychotic features ([place])     Options for TR sx.  Both Dx are TR and marked.  Impaired.  Her depression seems worse.  Perhaps some previous failed meds could be tried in high dosages.  Answered questions about ketamine and TMS in detail.  Ketamine not very available at this time but might help both.  TMS more available but no TMS coil for OCD in [place].  We discussed side effects of ketamine and TMS.  Disc alternative for TR sx including Trintellix, Vraylar. Disc using the unusual combo of Trintellix and lower dose Fluvoxamine.  Trintellix is a better antidepressant than fluvoxamine but not as good for OCD.  It is unlikely that her OCD would be manageable on Trintellix alone.  We discussed the risk of serotonin syndrome with the combination and would need to monitor closely for serotonergic side effects.  She is open to this possibility and we will discuss it again at her next appointment and about 1 month  Disc effects of perimenopause on sx and risks of estrogen.   Ativan and luvox could contribute to the fatigue but these appear necessary.  We discussed the short-term risks associated with  benzodiazepines including sedation and increased fall risk among others.  Discussed long-term side effect risk including dependence, potential withdrawal symptoms, and the potential eventual dose-related risk of dementia.  This appt was 45 mins.  Follow-up 1 month  Lynder Parents, MD, DFAPA  Please see After Visit Summary for patient specific instructions.  Future Appointments  Date Time Provider [place]  04/21/2020  2:00 PM Blanchie Serve, PhD [facility] None    No orders of the defined types were placed in this encounter.   -------------------------------

## 2020-04-21 ENCOUNTER — Ambulatory Visit (INDEPENDENT_AMBULATORY_CARE_PROVIDER_SITE_OTHER): Payer: 59 | Admitting: Psychiatry

## 2020-04-21 ENCOUNTER — Other Ambulatory Visit: Payer: Self-pay

## 2020-04-21 DIAGNOSIS — F422 Mixed obsessional thoughts and acts: Secondary | ICD-10-CM | POA: Diagnosis not present

## 2020-04-21 DIAGNOSIS — F339 Major depressive disorder, recurrent, unspecified: Secondary | ICD-10-CM

## 2020-04-21 DIAGNOSIS — F4010 Social phobia, unspecified: Secondary | ICD-10-CM | POA: Diagnosis not present
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
## 2020-05-15 ENCOUNTER — Encounter: Payer: Self-pay | Admitting: Psychiatry

## 2020-05-15 ENCOUNTER — Other Ambulatory Visit: Payer: Self-pay

## 2020-05-15 ENCOUNTER — Ambulatory Visit (INDEPENDENT_AMBULATORY_CARE_PROVIDER_SITE_OTHER): Payer: 59 | Admitting: Psychiatry

## 2020-05-15 DIAGNOSIS — F422 Mixed obsessional thoughts and acts: Secondary | ICD-10-CM | POA: Diagnosis not present

## 2020-05-15 DIAGNOSIS — F339 Major depressive disorder, recurrent, unspecified: Secondary | ICD-10-CM | POA: Diagnosis not present

## 2020-05-15 DIAGNOSIS — F4010 Social phobia, unspecified: Secondary | ICD-10-CM

## 2020-05-15 NOTE — Patient Instructions (Addendum)
Trintellix 10 mg 1 tablet in the morning with food and reduce fluvoxamine to 5 tablets nightly for 1 week  then reduce it to 4 tablets nightly.

## 2020-05-15 NOTE — Progress Notes (Signed)
Casey Diaz [id number] November 09, 1965 54 y.o.   Subjective:   Patient ID:  Casey Diaz is a 54 y.o. (DOB [birth year]) female.  Chief Complaint:  Chief Complaint  Patient presents with  . Follow-up  . Depression  . Anxiety  . Sleeping Problem     HPI Casey Diaz presents to the office today for follow-up of OCD and severe anxiety.    December 2019 visit the following was noted: No meds were changed. Lives in [name] and back for followup.  Sx are about the same.  Has to take meds with different sizes. Pt reports that mood is Anxious and Depressed and describes anxiety as Severe. Anxiety symptoms include: Excessive Worry, Obsessive Compulsive Symptoms:   Checking,,. Pt reports has interrupted sleep and nocturia. Pt reports that appetite is good. Pt reports that energy is no change and down slightly. Concentration is down slightly. Suicidal thoughts:  denied by patient. Loves the environment of [name] but misses some things there.  She's not able to work there.  H works there and likes it.  Struggled with not working, feels isolated and not up to task of meeting people.  Does attend a church and met a friend who's been helpful.  Leaving for [name] on 10/16/18.  04/09/2020 appointment the following is noted: Staying another year in [name] bc Covid and other things. Last few months a lot of crying spells.  Is in menopause. Wonders about med changes though is nervous about it.  Crying spells associated with depressing thoughts more than stress or OCD.   Covid really hard on everyone and couldn't see family for 18 mos.  Family still very dysfunctional. No close friends in part due to OCD and depression. Son high Autism spectrum with ADHD and anxiety and she's with him all the time. Greater health problems with CP so more pains.   05/15/20 appt with the following noted: [name] for menopause and helps some. Still depressed.  Chronically. In [place] for 2 more weeks then to  [name] for another year. A lot of stressors lately triggering more checking and anxiety.   OCD is her CC now and seems.  Got worse DT stress.   Stressed with Asberger's son and her health.  H works a lot.  Her FOO still stress.  Previous psych med trials include Prozac, paroxetine, sertraline, fluvoxamine, venlafaxine, Anafranil with no response,  Wellbutrin, , Viibryd,  Geodon,  risperidone, Rexulti, Abilify,  Seroquel, Latuda 40 mg with irritability. lamotrigine lithium, BuSpar, Namenda,  pramipexole with no response, and Topamax, pindolol  Review of Systems:  Review of Systems  HENT: Positive for dental problem.   Musculoskeletal: Positive for arthralgias, back pain and gait problem.  Neurological: Positive for weakness. Negative for tremors.    Medications: I have reviewed the patient's current medications.  Current Outpatient Medications  Medication Sig Dispense Refill  . aspirin EC 325 MG EC tablet Take 1 tablet (325 mg total) by mouth 2 (two) times daily. 60 tablet 0  . baclofen (LIORESAL) 10 MG tablet Take 10 mg by mouth daily as needed for muscle spasms.     Marland Kitchen buPROPion (WELLBUTRIN XL) 150 MG 24 hr tablet Take 450 mg by mouth daily.    . diclofenac sodium (VOLTAREN) 1 % GEL Apply 2 g topically 4 (four) times daily.    Marland Kitchen docusate sodium (COLACE) 100 MG capsule Take 1 capsule (100 mg total) by mouth 2 (two) times daily. 10 capsule 0  . fluticasone (FLONASE) 50 MCG/ACT  nasal spray Place 2 sprays into the nose daily.    . fluvoxaMINE (LUVOX) 50 MG tablet 6 at night    . hydrocortisone (ANUSOL-HC) 2.5 % rectal cream Place rectally 2 (two) times daily. x 7-14 days (Patient taking differently: Place 1 application rectally as needed for hemorrhoids or itching. ) 30 g 0  . ketotifen (ZADITOR) 0.025 % ophthalmic solution Place 3 drops into both eyes 2 (two) times daily as needed (For allergies.).     Marland Kitchen LINZESS 145 MCG CAPS capsule Take 145 mcg by mouth daily.    Marland Kitchen LORazepam (ATIVAN) 1  MG tablet Take 1 mg by mouth every 6 (six) hours as needed for anxiety.     . methocarbamol (ROBAXIN) 500 MG tablet Take 1 tablet (500 mg total) by mouth every 6 (six) hours as needed for muscle spasms. 60 tablet 1  . MIBELAS 24 FE 1-20 MG-MCG(24) CHEW Chew 1 tablet by mouth at bedtime.     . montelukast (SINGULAIR) 10 MG tablet Take 10 mg by mouth daily.     . Multiple Vitamins-Minerals (ADULT GUMMY PO) Take 1 tablet by mouth daily.    . naproxen sodium (ALEVE) 220 MG tablet Take 220 mg by mouth.    . nitrofurantoin (MACRODANTIN) 100 MG capsule Take 100 mg by mouth as needed (For urinary tract infection.).     Marland Kitchen polyethylene glycol (MIRALAX / GLYCOLAX) packet Take 17 g by mouth daily as needed for mild constipation. 14 each 0  . promethazine (PHENERGAN) 25 MG tablet Take 25 mg by mouth every 4 (four) hours as needed for nausea or vomiting.    . temazepam (RESTORIL) 30 MG capsule Take 30 mg by mouth at bedtime as needed for sleep.     . traMADol (ULTRAM) 50 MG tablet Take 1-2 tablets (50-100 mg total) by mouth every 6 (six) hours as needed for moderate pain. 60 tablet 0   No current facility-administered medications for this visit.    Medication Side Effects: None   Allergies:  Allergies  Allergen Reactions  . Hydrocodone Itching  . Sulfamethoxazole-Trimethoprim Itching  . Dust Mite Extract Other (See Comments)    Sneezing, watery eyes, runny nose  . Latex Itching  . Other Other (See Comments)    PT IS ALLERGIC TO CAT DANDER AND RAGWEED - Sneezing, watery eyes, runny nose   . Pollen Extract Other (See Comments)    Sneezing, watery eyes, runny nose     Past Medical History:  Diagnosis Date  . 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 Anxiety   . Cerebral palsy (HCC)    right arm/leg  . Cystocele   . Depression   . Headache   . Neuromuscular disorder (HCC)    Cerebral Palsy  . OCD (obsessive compulsive disorder)   . Osteoporosis   . Uterine prolaps      Family History  Problem Relation Age of Onset  . Cancer Father        skin AND LUNG  . Alcohol abuse Sister        CRACK COCAINE    Social History   Socioeconomic History  . Marital status: Married    Spouse name: Not on file  . Number of children: Not on file  . Years of education: Not on file  . Highest education level: Not on file  Occupational History  . Not on file  Tobacco Use  . Smoking status: Never Smoker  . Smokeless tobacco: Never Used  Substance and Sexual Activity  . Alcohol use: Yes    Comment: OCCASIONAL beer  . Drug use: No  . Sexual activity: Yes    Birth control/protection: Pill    Comment: LOESTRIN 24 FE  Other Topics Concern  . Not on file  Social History Narrative  . Not on file   Social Determinants of Health   Financial Resource Strain:   . Difficulty of Paying Living Expenses:   Food Insecurity:   . Worried About Charity fundraiser in the Last Year:   . Arboriculturist in the Last Year:   Transportation Needs:   . Film/video editor (Medical):   Marland Kitchen Lack of Transportation (Non-Medical):   Physical Activity:   . Days of Exercise per Week:   . Minutes of Exercise per Session:   Stress:   . Feeling of Stress :   Social Connections:   . Frequency of Communication with Friends and Family:   . Frequency of Social Gatherings with Friends and Family:   . Attends Religious Services:   . Active Member of Clubs or Organizations:   . Attends Archivist Meetings:   Marland Kitchen Marital Status:   Intimate Partner Violence:   . Fear of Current or Ex-Partner:   . Emotionally Abused:   Marland Kitchen Physically Abused:   . Sexually Abused:     Past Medical History, Surgical history, Social history, and Family history were reviewed and updated as appropriate.   Please see review of systems for further details on the patient's review from today.   Objective:   Physical Exam:  There were no vitals taken for this visit.  Physical  Exam Constitutional:      General: She is not in acute distress. Musculoskeletal:        General: No deformity.  Neurological:     Mental Status: She is alert and oriented to person, place, and time.     Cranial Nerves: No dysarthria.     Coordination: Coordination normal.  Psychiatric:        Attention and Perception: Attention and perception normal. She does not perceive auditory or visual hallucinations.        Mood and Affect: Mood is anxious and depressed. Affect is not labile, blunt, angry, tearful or inappropriate.        Speech: Speech normal.        Behavior: Behavior normal. Behavior is cooperative.        Thought Content: Thought content normal. Thought content is not paranoid or delusional. Thought content does not include homicidal or suicidal ideation. Thought content does not include homicidal or suicidal plan.        Cognition and Memory: Cognition and memory normal.        Judgment: Judgment normal.     Comments: Insight intact Chronic obsessions and compulsions worse lately.     Lab Review:     Component Value Date/Time   NA 135 07/09/2016 0438   K 4.2 07/09/2016 0438   CL 104 07/09/2016 0438   CO2 24 07/09/2016 0438   GLUCOSE 140 (H) 07/09/2016 0438   BUN 9 07/09/2016 0438   CREATININE 0.81 07/09/2016 0438   CALCIUM 8.9 07/09/2016 0438   GFRNONAA >60 07/09/2016 0438   GFRAA >60 07/09/2016 0438       Component Value Date/Time   WBC 5.4 07/09/2016 0438   RBC 4.05 07/09/2016 0438   HGB 12.9 07/09/2016 0438   HCT 38.3 07/09/2016 0438   PLT  302 07/09/2016 0438   MCV 94.6 07/09/2016 0438   MCH 31.9 07/09/2016 0438   MCHC 33.7 07/09/2016 0438   RDW 12.9 07/09/2016 0438    No results found for: POCLITH, LITHIUM   No results found for: PHENYTOIN, PHENOBARB, VALPROATE, CBMZ   .res Assessment: Plan:    Casey Diaz was seen today for follow-up, depression, anxiety and sleeping problem.  Diagnoses and all orders for this visit:  Recurrent major depression  resistant to treatment [facility])  Mixed obsessional thoughts and acts  Social anxiety disorder    Options for TR sx.  Both Dx are TR and marked.  Impaired.  Her depression seems worse.  Perhaps some previous failed meds could be tried in high dosages.  Answered questions about ketamine and TMS in detail.  Ketamine not very available at this time but might help both.  TMS more available but no TMS coil for OCD in [place].  We discussed side effects of ketamine and TMS.  Disc alternative for TR sx including Trintellix, Vraylar. Disc using the unusual combo of Trintellix and lower dose Fluvoxamine.  Trintellix is a better antidepressant than fluvoxamine but not as good for OCD.  It is unlikely that her OCD would be manageable on Trintellix alone.  We discussed the risk of serotonin syndrome with the combination and would need to monitor closely for serotonergic side effects.  Disc relative SE and DDI with other meds including Wellbutrin. She's afraid to reduce Luvox at this time DT fear of worsening OCD.  But will consider. Trintellix 10 mg 1 tablet in the morning with food and reduce fluvoxamine to 5 tablets nightly for 1 week  then reduce it to 4 tablets nightly.   Disc effects of perimenopause on sx and risks of estrogen.   Ativan and luvox could contribute to the fatigue but these appear necessary.  We discussed the short-term risks associated with benzodiazepines including sedation and increased fall risk among others.  Discussed long-term side effect risk including dependence, potential withdrawal symptoms, and the potential eventual dose-related risk of dementia.  This appt was 45 mins.  Follow-up 2 month  Lynder Parents, MD, DFAPA  Please see After Visit Summary for patient specific instructions.  No future appointments.  No orders of the defined types were placed in this encounter.   -------------------------------

## 2020-06-15 ENCOUNTER — Telehealth: Payer: Self-pay | Admitting: Psychiatry

## 2020-06-16 NOTE — Telephone Encounter (Signed)
Error

## 2020-07-02 ENCOUNTER — Encounter: Payer: Self-pay | Admitting: Psychiatry

## 2020-07-02 ENCOUNTER — Telehealth (INDEPENDENT_AMBULATORY_CARE_PROVIDER_SITE_OTHER): Payer: 59 | Admitting: Psychiatry

## 2020-07-02 DIAGNOSIS — F339 Major depressive disorder, recurrent, unspecified: Secondary | ICD-10-CM

## 2020-07-02 DIAGNOSIS — F422 Mixed obsessional thoughts and acts: Secondary | ICD-10-CM

## 2020-07-02 DIAGNOSIS — F4010 Social phobia, unspecified: Secondary | ICD-10-CM | POA: Diagnosis not present

## 2020-07-02 NOTE — Progress Notes (Signed)
Casey Diaz [id number] 12-01-1965 54 y.o.  Video Visit via My Chart  I connected with pt by My Chart and verified that I am speaking with the correct person using two identifiers.   I discussed the limitations, risks, security and privacy concerns of performing an evaluation and management service by My Chart  and the availability of in person appointments. I also discussed with the patient that there may be a patient responsible charge related to this service. The patient expressed understanding and agreed to proceed.  I discussed the assessment and treatment plan with the patient. The patient was provided an opportunity to ask questions and all were answered. The patient agreed with the plan and demonstrated an understanding of the instructions.   The patient was advised to call back or seek an in-person evaluation if the symptoms worsen or if the condition fails to improve as anticipated.  I provided 30 minutes of video time during this encounter.  The patient was located at home and the provider was located office. Call started 930 and ended at 10:00  Subjective:   Patient ID:  Casey Diaz is a 54 y.o. (DOB 1965-11-04) female.  Chief Complaint:  Chief Complaint  Patient presents with   Follow-up    med change   Depression   Anxiety     HPI Casey Diaz presents to the office today for follow-up of OCD and severe anxiety.    December 2019 visit the following was noted: No meds were changed. Lives in [place] and back for followup.  Sx are about the same.  Has to take meds with different sizes. Pt reports that mood is Anxious and Depressed and describes anxiety as Severe. Anxiety symptoms include: Excessive Worry, Obsessive Compulsive Symptoms:   Checking,,. Pt reports has interrupted sleep and nocturia. Pt reports that appetite is good. Pt reports that energy is no change and down slightly. Concentration is down slightly. Suicidal thoughts:  denied by patient. Loves the  environment of [place] but misses some things there.  She's not able to work there.  H works there and likes it.  Struggled with not working, feels isolated and not up to task of meeting people.  Does attend a church and met a friend who's been helpful.  Leaving for [place] on 10/16/18.  04/09/2020 appointment the following is noted: Staying another year in [place] bc Covid and other things. Last few months a lot of crying spells.  Is in menopause. Wonders about med changes though is nervous about it.  Crying spells associated with depressing thoughts more than stress or OCD.   Covid really hard on everyone and couldn't see family for 18 mos.  Family still very dysfunctional. No close friends in part due to OCD and depression. Son high Autism spectrum with ADHD and anxiety and she's with him all the time. Greater health problems with CP so more pains.   05/15/20 appt with the following noted: Casey Diaz for menopause and helps some. Still depressed.  Chronically. In [place] for 2 more weeks then to [place] for another year. A lot of stressors lately triggering more checking and anxiety.   OCD is her CC now and seems.  Got worse DT stress.   Stressed with Asberger's son and her health.  H works a lot.  Her FOO still stress. Plan: Trintellix 10 mg 1 tablet in the morning with food and reduce fluvoxamine to 5 tablets nightly for 1 week  then reduce it to 4 tablets nightly.  07/02/20 appt with the following noted: Decided not to get Trintellix bc difficulty getting it. It is available.  There.  Wants to start it now.   Both depression and OCD are severe.  Not suicidal in intent or plan. Did not take samples with her to [place] but will be back in December. covid is worse there and travel is difficult.  Wants to reduce Wellbutrin DT dry mouth.  Previous psych med trials include Prozac, paroxetine, sertraline, fluvoxamine, venlafaxine, Anafranil with no response,  Wellbutrin, , Viibryd,   Geodon,  risperidone, Rexulti, Abilify,  Seroquel, Latuda 40 mg with irritability. lamotrigine lithium, BuSpar, Namenda,  pramipexole with no response, and Topamax, pindolol  Review of Systems:  Review of Systems  HENT: Positive for dental problem.   Musculoskeletal: Positive for arthralgias, back pain and gait problem.  Neurological: Positive for weakness. Negative for tremors.    Medications: I have reviewed the patient's current medications.  Current Outpatient Medications  Medication Sig Dispense Refill   aspirin EC 325 MG EC tablet Take 1 tablet (325 mg total) by mouth 2 (two) times daily. 60 tablet 0   baclofen (LIORESAL) 10 MG tablet Take 10 mg by mouth daily as needed for muscle spasms.      buPROPion (WELLBUTRIN XL) 150 MG 24 hr tablet Take 450 mg by mouth daily.     diclofenac sodium (VOLTAREN) 1 % GEL Apply 2 g topically 4 (four) times daily.     docusate sodium (COLACE) 100 MG capsule Take 1 capsule (100 mg total) by mouth 2 (two) times daily. 10 capsule 0   fluticasone (FLONASE) 50 MCG/ACT nasal spray Place 2 sprays into the nose daily.     fluvoxaMINE (LUVOX) 50 MG tablet 6 at night     hydrocortisone (ANUSOL-HC) 2.5 % rectal cream Place rectally 2 (two) times daily. x 7-14 days (Patient taking differently: Place 1 application rectally as needed for hemorrhoids or itching. ) 30 g 0   ketotifen (ZADITOR) 0.025 % ophthalmic solution Place 3 drops into both eyes 2 (two) times daily as needed (For allergies.).      LINZESS 145 MCG CAPS capsule Take 145 mcg by mouth daily.     LORazepam (ATIVAN) 1 MG tablet Take 1 mg by mouth every 6 (six) hours as needed for anxiety.      methocarbamol (ROBAXIN) 500 MG tablet Take 1 tablet (500 mg total) by mouth every 6 (six) hours as needed for muscle spasms. 60 tablet 1   MIBELAS 24 FE 1-20 MG-MCG(24) CHEW Chew 1 tablet by mouth at bedtime.      montelukast (SINGULAIR) 10 MG tablet Take 10 mg by mouth daily.      Multiple  Vitamins-Minerals (ADULT GUMMY PO) Take 1 tablet by mouth daily.     naproxen sodium (ALEVE) 220 MG tablet Take 220 mg by mouth.     nitrofurantoin (MACRODANTIN) 100 MG capsule Take 100 mg by mouth as needed (For urinary tract infection.).      polyethylene glycol (MIRALAX / GLYCOLAX) packet Take 17 g by mouth daily as needed for mild constipation. 14 each 0   promethazine (PHENERGAN) 25 MG tablet Take 25 mg by mouth every 4 (four) hours as needed for nausea or vomiting.     temazepam (RESTORIL) 30 MG capsule Take 30 mg by mouth at bedtime as needed for sleep.      traMADol (ULTRAM) 50 MG tablet Take 1-2 tablets (50-100 mg total) by mouth every 6 (six) hours as needed for  moderate pain. 60 tablet 0   No current facility-administered medications for this visit.    Medication Side Effects: None   Allergies:  Allergies  Allergen Reactions   Hydrocodone Itching   Sulfamethoxazole-Trimethoprim Itching   Dust Mite Extract Other (See Comments)    Sneezing, watery eyes, runny nose   Latex Itching   Other Other (See Comments)    PT IS ALLERGIC TO CAT DANDER AND RAGWEED - Sneezing, watery eyes, runny nose    Pollen Extract Other (See Comments)    Sneezing, watery eyes, runny nose     Past Medical History:  Diagnosis Date   Abnormal Pap smear 2011   hpv/mild dysplasia,cin1   Anxiety    Cerebral palsy (HCC)    right arm/leg   Cystocele    Depression    Headache    Neuromuscular disorder (HCC)    Cerebral Palsy   OCD (obsessive compulsive disorder)    Osteoporosis    Uterine prolaps     Family History  Problem Relation Age of Onset   Cancer Father        skin AND LUNG   Alcohol abuse Sister        CRACK COCAINE    Social History   Socioeconomic History   Marital status: Married    Spouse name: Not on file   Number of children: Not on file   Years of education: Not on file   Highest education level: Not on file  Occupational History   Not  on file  Tobacco Use   Smoking status: Never Smoker   Smokeless tobacco: Never Used  Substance and Sexual Activity   Alcohol use: Yes    Comment: OCCASIONAL beer   Drug use: No   Sexual activity: Yes    Birth control/protection: Pill    Comment: LOESTRIN 24 FE  Other Topics Concern   Not on file  Social History Narrative   Not on file   Social Determinants of Health   Financial Resource Strain:    Difficulty of Paying Living Expenses: Not on file  Food Insecurity:    Worried About Charity fundraiser in the Last Year: Not on file   [employer] of Food in the Last Year: Not on file  Transportation Needs:    Lack of Transportation (Medical): Not on file   Lack of Transportation (Non-Medical): Not on file  Physical Activity:    Days of Exercise per Week: Not on file   Minutes of Exercise per Session: Not on file  Stress:    Feeling of Stress : Not on file  Social Connections:    Frequency of Communication with Friends and Family: Not on file   Frequency of Social Gatherings with Friends and Family: Not on file   Attends Religious Services: Not on file   Active Member of Clubs or Organizations: Not on file   Attends Archivist Meetings: Not on file   Marital Status: Not on file  Intimate Partner Violence:    Fear of Current or Ex-Partner: Not on file   Emotionally Abused: Not on file   Physically Abused: Not on file   Sexually Abused: Not on file    Past Medical History, Surgical history, Social history, and Family history were reviewed and updated as appropriate.   Please see review of systems for further details on the patient's review from today.   Objective:   Physical Exam:  There were no vitals taken for this visit.  Physical  Exam Neurological:     Mental Status: She is alert and oriented to person, place, and time.     Cranial Nerves: No dysarthria.  Psychiatric:        Attention and Perception: Attention and perception  normal.        Mood and Affect: Mood is anxious and depressed.        Speech: Speech normal.        Behavior: Behavior is cooperative.        Thought Content: Thought content normal. Thought content is not paranoid or delusional. Thought content does not include homicidal or suicidal ideation. Thought content does not include homicidal or suicidal plan.        Cognition and Memory: Cognition and memory normal.        Judgment: Judgment normal.     Comments: Insight intact Ongoing OCD remains fairly severe     Lab Review:     Component Value Date/Time   NA 135 07/09/2016 0438   K 4.2 07/09/2016 0438   CL 104 07/09/2016 0438   CO2 24 07/09/2016 0438   GLUCOSE 140 (H) 07/09/2016 0438   BUN 9 07/09/2016 0438   CREATININE 0.81 07/09/2016 0438   CALCIUM 8.9 07/09/2016 0438   GFRNONAA >60 07/09/2016 0438   GFRAA >60 07/09/2016 0438       Component Value Date/Time   WBC 5.4 07/09/2016 0438   RBC 4.05 07/09/2016 0438   HGB 12.9 07/09/2016 0438   HCT 38.3 07/09/2016 0438   PLT 302 07/09/2016 0438   MCV 94.6 07/09/2016 0438   MCH 31.9 07/09/2016 0438   MCHC 33.7 07/09/2016 0438   RDW 12.9 07/09/2016 0438    No results found for: POCLITH, LITHIUM   No results found for: PHENYTOIN, PHENOBARB, VALPROATE, CBMZ   .res Assessment: Plan:    Casey Diaz was seen today for follow-up, depression and anxiety.  Diagnoses and all orders for this visit:  Recurrent major depression resistant to treatment [facility])  Mixed obsessional thoughts and acts  Social anxiety disorder    Options for TR sx.  Both Dx are TR and marked.  Impaired.  Her depression seems worse.  Perhaps some previous failed meds could be tried in high dosages.  Answered questions about ketamine and TMS in detail.  Ketamine not very available at this time but might help both.  TMS more available but no TMS coil for OCD in [place].  We discussed side effects of ketamine and TMS.  Disc alternative for TR sx including Trintellix,  Vraylar. Disc using the unusual combo of Trintellix and lower dose Fluvoxamine.  Trintellix is a better antidepressant than fluvoxamine but not as good for OCD.  It is unlikely that her OCD would be manageable on Trintellix alone.  We discussed the risk of serotonin syndrome with the combination and would need to monitor closely for serotonergic side effects.  Disc relative SE and DDI with other meds including Wellbutrin. She's afraid to reduce Luvox at this time DT fear of worsening OCD.  But will consider. Trintellix 10 mg 1 tablet in the morning with food and reduce fluvoxamine to 5 tablets nightly for 1 week  then reduce it to 4 tablets nightly. Also reduce Wellbutrin XL to 300 mg daily.  Read about serotonin syndrome which can range from mild to severe.  Call if there are any problems.  She plans to see psychiatrist in [place] to discuss this next week.  Disc effects of perimenopause on sx and risks  of estrogen.   Ativan and luvox could contribute to the fatigue but these appear necessary.  We discussed the short-term risks associated with benzodiazepines including sedation and increased fall risk among others.  Discussed long-term side effect risk including dependence, potential withdrawal symptoms, and the potential eventual dose-related risk of dementia.  Follow-up 2 month  Casey Parents, MD, DFAPA  Please see After Visit Summary for patient specific instructions.  Future Appointments  Date Time Provider [place]  07/08/2020 10:00 AM Blanchie Serve, PhD [facility] None  10/06/2020  9:45 AM Cottle, Billey Co., MD [facility] None  10/06/2020 11:00 AM Blanchie Serve, PhD [facility] None    No orders of the defined types were placed in this encounter.   -------------------------------

## 2020-07-02 NOTE — Patient Instructions (Addendum)
vortioxetine  (Brintellix or Trintellix) 10 mg 1 tablet in the morning with food and reduce fluvoxamine to 5 tablets nightly for 1 week  then reduce it to 4 tablets nightly.  Also reduce Wellbutrin XL to 300 mg daily.  Read about serotonin syndrome which can range from mild to severe.  Call if there are any problems.

## 2020-07-08 ENCOUNTER — Ambulatory Visit: Payer: 59 | Admitting: Psychiatry

## 2020-07-08 DIAGNOSIS — F4010 Social phobia, unspecified: Secondary | ICD-10-CM

## 2020-07-08 DIAGNOSIS — F422 Mixed obsessional thoughts and acts: Secondary | ICD-10-CM

## 2020-07-08 DIAGNOSIS — F339 Major depressive disorder, recurrent, unspecified: Secondary | ICD-10-CM

## 2020-07-08 NOTE — Progress Notes (Signed)
Admin note for non-service contact  Patient ID: ADIBA FARGNOLI  MRN: [id number] DATE: 07/08/2020  Rescheduled due to technical difficulties and urgent needs of previous patient.  Robley Fries, PhD [name], PhD LP Clinical Psychologist, [facility] [address], [address] [place], [place] [address] [phone]

## 2020-07-22 ENCOUNTER — Ambulatory Visit: Payer: 59 | Admitting: Psychiatry

## 2020-07-27 ENCOUNTER — Ambulatory Visit: Payer: 59 | Admitting: Psychiatry

## 2020-08-03 ENCOUNTER — Ambulatory Visit: Payer: 59 | Admitting: Psychiatry

## 2020-08-03 DIAGNOSIS — Z91199 Patient's noncompliance with other medical treatment and regimen due to unspecified reason: Secondary | ICD-10-CM

## 2020-08-03 DIAGNOSIS — Z5329 Procedure and treatment not carried out because of patient's decision for other reasons: Secondary | ICD-10-CM

## 2020-08-03 NOTE — Progress Notes (Signed)
Admin note for non-service contact  Patient ID: [name]  MRN: [id number] DATE: 08/03/2020  Video visit set up by patient after TX had to cancel 4 weeks ago due to demands of prior patients.  Staff instructions re. sending a video link, but Caregility video requirement means using the built-in MyChart video app.  Emailed Pt ahead of session with instructions, emailed link from within [place], tried all three listed phone numbers without response from Pt, understood to be in [name] at present.    In view of prior TX cancellation, no charge for no show at this time.  Pt to RS at discretion.  Robley Fries, PhD [name], PhD LP Clinical Psychologist, [facility] [address], [address] [place], [place] [address] [phone]

## 2020-08-18 ENCOUNTER — Ambulatory Visit (INDEPENDENT_AMBULATORY_CARE_PROVIDER_SITE_OTHER): Payer: 59 | Admitting: Psychiatry

## 2020-08-18 DIAGNOSIS — F422 Mixed obsessional thoughts and acts: Secondary | ICD-10-CM

## 2020-08-18 DIAGNOSIS — F4010 Social phobia, unspecified: Secondary | ICD-10-CM

## 2020-08-18 DIAGNOSIS — F339 Major depressive disorder, recurrent, unspecified: Secondary | ICD-10-CM | POA: Diagnosis not present

## 2020-08-18 NOTE — Progress Notes (Signed)
Psychotherapy Progress Note [facility] Casey Czar, PhD LP  Patient ID: Casey Diaz     MRN: [id number] Therapy format: Individual psychotherapy Date: 08/18/2020      Start: 10:30a     Stop: 11:15a     Time Spent: 45 min Location: Telehealth visit -- I connected with this patient by an approved telecommunication method (video), with her informed consent, and verifying identity and patient privacy.  I was located at my office and patient at her home.  As needed, we discussed the limitations, risks, and security and privacy concerns associated with telehealth service, including the availability and conditions which currently govern in-person appointments and the possibility that 3rd-party payment may not be fully guaranteed and she may be responsible for charges.  After she indicated understanding, we proceeded with the session.  Also discussed treatment planning, as needed, including ongoing verbal agreement with the plan, the opportunity to ask and answer all questions, her demonstrated understanding of instructions, and her readiness to call the office should symptoms worsen or she feels she is in a crisis state and needs more immediate and tangible assistance.   Session narrative (presenting needs, interim history, self-report of stressors and symptoms, applications of prior therapy, status changes, and interventions made in session) Seeing a psychiatrist in [name] now -- [place], "Casey A".  Moving back to [place] in July.  Tried Trintellix from Casey. Jennelle Diaz but it disagreed with her, weaned off it.  Currently on Luvox, Wellbutrin, lorazepam, temazepam (for sleep).  "Same meds" (as when?)...  Can break out crying these days.  In menopause.  Wonders about EMDR and TMS when she comes back to [place].    Not working all 3 years in [name].  Dealing with back pain attributed to aging CP, plus a meniscus tear.  On Baclofen, sees a [place].    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  Tempted hard to blame herself, her genes, etc., but admits he is not suffering.  He is an only child, feels she/they may have spoiled him some.  Confronted ideas of shame over him and how he is, reframed the central problem as being reminded of herself, and the decades of shame she has practiced.  Friends tell her not to be so hard on herself, just not that easy.  Sad to continue to see that family -- who have been historically very self-involved -- don't call her.  Sister continues to occupy their house, not paying rent as agreed.  Basically set to forgive the debt, in light of her own health problems (depression, D&A recovery, medical issues developed).  Mother remains obsessive, irrational about money, and hard to deal with at 82yo.  Brother Casey Diaz remains distant, while B Casey Diaz remains psychiatrically disabled, bipolar, and dependent on mother.  Herself, OCD still flares up hard with thinking she ran over people and having to go back and check.  Advised same as always to try to resist checking in order to weaken OCD's hold, but, if irresistible, check only once -- slowly, deliberately, mindfully.  Reinforced the notion from addictions that "The cravings will pass, whether you 'use' or not."  Reinforced decisiveness in the approach and latching onto resistance to checking or making a single check count.  Discussed alternative modalities and history of frustration with preauthorizations.  Offered the possibility that vagus nerve stimulation techniques may help, though will need further investigation.    Therapeutic modalities: Cognitive Behavioral Therapy, Solution-Oriented/Positive Psychology and Psycho-education/Bibliotherapy  Mental Status/Observations:  Appearance:   Casual     Behavior:  Appropriate  Motor:  Normal  Speech/Language:   Clear and Coherent  Affect:  Appropriate  Mood:  anxious and depressed  Thought process:  normal  Thought content:    Obsessions   Sensory/Perceptual disturbances:    WNL  Orientation:  Fully oriented  Attention:  Good    Concentration:  Fair  Memory:  WNL  Insight:    Good  Judgment:   Good  Impulse Control:  Fair   Risk Assessment: Danger to Self: No Self-injurious Behavior: No Danger to Others: No Physical Aggression / Violence: No Duty to Warn: No Access to Firearms a concern: No  Assessment of progress:  stabilized  Diagnosis:   ICD-10-CM   1. Recurrent major depression resistant to treatment (HCC)  F33.9   2. Mixed obsessional thoughts and acts  F42.2   3. Social anxiety disorder  F40.10    Plan:  . Rededicate to changing the manner of checking if not able to refrain from checking -- check once, check deliberately, slow down and pay attention . Other recommendations/advice as may be noted above . Continue to utilize previously learned skills ad lib . Maintain medication as prescribed and work faithfully with relevant prescriber(s) if any changes are desired or seem indicated . Call the clinic on-call service, present to ER, or call 911 if any life-threatening psychiatric crisis Return in about 6 weeks (around 09/29/2020) for session(s) already scheduled. . Already scheduled visit in this office 10/06/2020.  Casey Fries, PhD Casey Czar, PhD LP Clinical Psychologist, [facility] [address] [place], [place] [address] [phone]

## 2020-10-06 ENCOUNTER — Other Ambulatory Visit: Payer: Self-pay

## 2020-10-06 ENCOUNTER — Ambulatory Visit: Payer: 59 | Admitting: Psychiatry

## 2020-10-06 ENCOUNTER — Ambulatory Visit (INDEPENDENT_AMBULATORY_CARE_PROVIDER_SITE_OTHER): Payer: 59 | Admitting: Psychiatry

## 2020-10-06 DIAGNOSIS — Z638 Other specified problems related to primary support group: Secondary | ICD-10-CM

## 2020-10-06 DIAGNOSIS — F4010 Social phobia, unspecified: Secondary | ICD-10-CM

## 2020-10-06 DIAGNOSIS — G809 Cerebral palsy, unspecified: Secondary | ICD-10-CM | POA: Diagnosis not present

## 2020-10-06 DIAGNOSIS — F422 Mixed obsessional thoughts and acts: Secondary | ICD-10-CM

## 2020-10-06 DIAGNOSIS — F339 Major depressive disorder, recurrent, unspecified: Secondary | ICD-10-CM

## 2020-10-06 NOTE — Patient Instructions (Addendum)
Therapy reminders today for OCD:  The challenge for repeat checking is to change the pattern -- you can check slowly, check once, check so many times you get sick of it, "almost" check (loiter near the object without actually looking), or stare until you're bored -- and each of them will make it a more flexible behavior.  You can also resist, hell-or-high-water, scream if you need to, and get away from the checking instead of approaching it.     Likewise, if you are too weary to fight it, you can go ahead check at a given time, just don't guilt yourself about it -- it's a rational choice about how to spend energy, and the only issue beyond that is that it is when you do resistance exercises that you get more of the freedom you want later.  It's always about do the harder thing now, enjoy the benefits later.  Practically speaking, you can set a goal of look once, look deliberately, then get the hell out and stay out, because that's when you win more of your freedom from intrusive thoughts.  And yes, I am entirely sure the things that intrude on your thoughts are not your fault, not your guilt.  They are thoughts that come to you, and you are the receiver, like with the sound of someone else's car alarm.  Part of the problem may be the way you keep looking for guilt -- when you look and look and lok, your imagination will sometimes come up with the sight of it, but it will be a false alarm, still.  Try to trust that -- you will be able to briefly see things you're afraid would be your fault, but it doesn't mean it's fact.  Do I trust your character, and God's regard for you?  "Abso-freakin'-lutely!"

## 2020-10-06 NOTE — Progress Notes (Signed)
Psychotherapy Progress Note [facility] [name], PhD LP  Patient ID: Casey Diaz     MRN: [id number] Therapy format: Individual psychotherapy Date: 10/06/2020      Start: 11:15a     Stop: 12:12p     Time Spent: 45 min (remainder donated) Location: In-person   Session narrative (presenting needs, interim history, self-report of stressors and symptoms, applications of prior therapy, status changes, and interventions made in session) Visiting for holidays, sees mother's judgment still difficult as always.  Had Casey Diaz evaluated for autistic spectrum and ADHD, working on his social skills.  Tempted as always to consider it her own fault.  Bad news on arrival home that cousin Casey Diaz's partner Casey Diaz died of ovarian cancer 2 days ago.  Grieved for a good person to be taken this way.  Sister has had skin cancer with repeated surgeries to take lesions that could be life-threatening.  Conflicted about whether to still have her move out in April, knowing this and her low-paying job.  Apprehensive about the prospect of addressing her to move out, disappointed how she's not taken care of the house as promised.    RE. OCD, has tried to apply prior recommendations to limit the number of times she checks corners and spaces for children lurking whom she has the intrusive thought she has abused.  Despite thousands upon thousands of instances checking and not finding any such, Casey Diaz says she simply cannot help but check.  Reinforced that this is overlearned behavior and that she has spent many years now accommodating and obeying the urge.  Offered that the first, most important thing she can do at this stage is not agonizing over whether she gives in or resists, that if she chooses to give in, it is because she is conserving energy, not because she is actually guilty, and that if she chooses to resist, it is because she wants to establish more freedom from obsession and compulsion, simply that.  At  her request, made notes in patient instructions section of AVS.  Medically, couldn't abide Trintellix for SE, brought back.  Interested in ketamine therapy after reading some encouraging news that it could help OCD.  Asks [place]'s opinion, given limited understanding but sounds encouraging, particularly as a way to get her brain to rewire its response to "dismal" signals that have persistently powered depression and OCD, not to mention social anxiety.  Advised to process, namely to interact with [name] about the possibility of the treatment and the potential availability of the clinic nurse for more detail on what to expect.  In all likelihood, requires preauthorization, same as the frustrating experience with TMS 3 years ago, but I certainly support the option to help change the potent feelings which have for many years now driven clinically significant behavior.  Dealing with orthopedic issues knee and hip that she understands to be accelerated wear and tear from CP and injury from falls.  Curious if there's anything she can do about them.  Advised antiinflammatory lifestyle and be willing to stretch regularly.    Therapeutic modalities: Cognitive Behavioral Therapy and Solution-Oriented/Positive Psychology  Mental Status/Observations:  Appearance:   Casual     Behavior:  Appropriate  Motor:  Normal  Speech/Language:   Clear and Coherent  Affect:  Appropriate, responsive  Mood:  dysthymic  Thought process:  normal  Thought content:    Obsessions  Sensory/Perceptual disturbances:    WNL  Orientation:  Fully oriented  Attention:  Good  Concentration:  Good  Memory:  WNL  Insight:    Good  Judgment:   Good  Impulse Control:  Fair   Risk Assessment: Danger to Self: No Self-injurious Behavior: No Danger to Others: No Physical Aggression / Violence: No Duty to Warn: No Access to Firearms a concern: No  Assessment of progress: Stabilized, chronic in low self-esteem, checking  compulsions, and feeling overwhelmed with empathetic concerns for family  Diagnosis:   ICD-10-CM   1. Recurrent major depression resistant to treatment (HCC)  F33.9   2. OCD - guilt and unpardonable behavior focus  F42.2   3. Social anxiety disorder  F40.10   4. 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HCC)  G80.9   5. Relationship problem with family member  Z63.8    Plan:  . For family concerns, continue best able letting adults make their own decisions and learn from consequences a la Al-Anon philosophy . For Casey Diaz in particular, request she look into housing options through [employer], who treats her, and be willing to help her locate services and/or affordable housing rather than assume it is stymied until they return and face urgent dilemma whether to evict her or let her room with them . For OCD, resume efforts to minimize repeat checking and be more deliberate about checking for fantasized abuse victims and keep handy the patient instructions from today.  (See AVS for coping thoughts and portable advice.) . For persistent orthopedic pain, pursue anti-inflammatory lifestyle and be willing to stretch regularly . Other recommendations/advice as may be noted above . Continue to utilize previously learned skills ad lib . Maintain medication as prescribed and work faithfully with relevant prescriber(s) if any changes are desired or seem indicated . Call the clinic on-call service, present to ER, or call 911 if any life-threatening psychiatric crisis Return for time as available.  As Casey Diaz lives in [name], telehealth will not be available due to licensure requirements until she can physically relocate to the jurisdiction. . Already scheduled visit in this office Visit date not found.  Robley Fries, PhD [name], PhD LP Clinical Psychologist, [facility] [address] [place], [place] [address] [phone]

## 2021-05-06 ENCOUNTER — Ambulatory Visit: Payer: Self-pay | Admitting: Allergy

## 2021-05-14 ENCOUNTER — Ambulatory Visit: Payer: Managed Care, Other (non HMO) | Admitting: Psychiatry

## 2021-05-17 ENCOUNTER — Ambulatory Visit: Payer: 59 | Admitting: Psychiatry

## 2021-05-19 ENCOUNTER — Other Ambulatory Visit: Payer: Self-pay

## 2021-05-19 ENCOUNTER — Ambulatory Visit (INDEPENDENT_AMBULATORY_CARE_PROVIDER_SITE_OTHER): Payer: 59 | Admitting: Psychiatry

## 2021-05-19 DIAGNOSIS — F4010 Social phobia, unspecified: Secondary | ICD-10-CM | POA: Diagnosis not present

## 2021-05-19 DIAGNOSIS — G809 Cerebral palsy, unspecified: Secondary | ICD-10-CM | POA: Diagnosis not present

## 2021-05-19 DIAGNOSIS — F422 Mixed obsessional thoughts and acts: Secondary | ICD-10-CM | POA: Diagnosis not present

## 2021-05-19 DIAGNOSIS — F339 Major depressive disorder, recurrent, unspecified: Secondary | ICD-10-CM

## 2021-05-19 NOTE — Progress Notes (Signed)
Psychotherapy Progress Note [facility] [name], PhD LP  Patient ID: Casey Diaz     MRN: [id number] Therapy format: Individual psychotherapy Date: 05/19/2021      Start: 1:14p     Stop: 2:02p     Time Spent: 48 min Location: In-person   Session narrative (presenting needs, interim history, self-report of stressors and symptoms, applications of prior therapy, status changes, and interventions made in session) Back from [name] 3 weeks now.  Casey Diaz had COVID a few months ago.  Finding now she misses [name] weather and seascape.  Son Casey Diaz was dx'd ASD, ADHD, and severe anxiety while there.  Now back here at 55yo, after 4 yrs in another country, and he's been having brittle reactions to unfamiliar places.  Support/empathy provided.   Interested in ketamine treatment, will want to ask [name] on 9/14.  Feels meds she's on have run their course, and in fact she has been on SSRIs etc for many years now.  Had high anxiety reaction to missed lorazepam and dysphoric reaction to missed Wellbutrin, demonstrating they are at least accustomed, if not quite useful.  Still has chronic problem with fearing corners, in bathrooms, which have long triggered the idea that there is an abused child lurking that she absolutely must check for.  Credit given for finally letting her husband know about these intrusive thoughts, and for him to prompt her to resist checking and move on.    Mostly, she mourns having CP, OCD, depression, and an ASD child, when she was hoping to see her child be active in sports the way she wished she could have been.  Is involved now with Casey Diaz, and will have Casey Diaz at [employer], a private school oriented to ASDs.  Hopes to get Casey Diaz some kind of female Dance movement psychotherapist.  Affirmed and encouraged, challenged to refrain from rehearsing her woes and give herself permission to work the problem, whatever the problem is at the moment.  Therapeutic modalities: Cognitive  Behavioral Therapy, Solution-Oriented/Positive Psychology, and Ego-Supportive  Mental Status/Observations:  Appearance:   Casual     Behavior:  Appropriate  Motor:  Normal  Speech/Language:   Clear and Coherent  Affect:  Appropriate  Mood:  anxious and depressed  Thought process:  normal  Thought content:    Obsessions  Sensory/Perceptual disturbances:    WNL  Orientation:  Fully oriented  Attention:  Good    Concentration:  Fair  Memory:  WNL  Insight:    Good  Judgment:   Good  Impulse Control:  Fair   Risk Assessment: Danger to Self: No Self-injurious Behavior: No Danger to Others: No Physical Aggression / Violence: No Duty to Warn: No Access to Firearms a concern: No  Assessment of progress:  stabilized  Diagnosis:   ICD-10-CM   1. 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 OCD - guilt and unpardonable behavior focus  F42.2     3. Social anxiety disorder  F40.10     4. 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HCC)  G80.9      Plan:  Self-affirm permission to work the problem more than agonize May inquire about ketamine with [name], advised that it may require the same preauthorization that TMS did before she expatriated Permission to grieve what was preferable about [name] Continue ad lib efforts to resist checking, though it is an extremely well-worn habit Other recommendations/advice as may be noted above Continue to utilize previously learned skills ad  lib Maintain medication as prescribed and work faithfully with relevant prescriber(s) if any changes are desired or seem indicated Call the clinic on-call service, present to ER, or call 911 if any life-threatening psychiatric crisis Return up to a month, as able. Already scheduled visit in this office 06/29/2021.  Robley Fries, PhD [name], PhD LP Clinical Psychologist, [facility] [address], [address] [place], [place]  [address] [phone]

## 2021-06-02 ENCOUNTER — Other Ambulatory Visit: Payer: Self-pay

## 2021-06-02 ENCOUNTER — Telehealth: Payer: Self-pay | Admitting: Psychiatry

## 2021-06-02 NOTE — Telephone Encounter (Signed)
Please RF: Fluvoxamine 300mg  1 tab night Wellbutrin XL 300mg  1 in am Temazepam 30mg  1 at bedtime Lorazepam 1.0 mg  2 in am , 2 at night ....  Not taking Trintellix now Please send to pharmacy: [facility] [address] [place], [place], [address] Has appt 9/13.

## 2021-06-03 ENCOUNTER — Other Ambulatory Visit: Payer: Self-pay | Admitting: Psychiatry

## 2021-06-03 DIAGNOSIS — F339 Major depressive disorder, recurrent, unspecified: Secondary | ICD-10-CM

## 2021-06-03 DIAGNOSIS — F4010 Social phobia, unspecified: Secondary | ICD-10-CM

## 2021-06-03 DIAGNOSIS — F422 Mixed obsessional thoughts and acts: Secondary | ICD-10-CM

## 2021-06-03 MED ORDER — FLUVOXAMINE MALEATE 100 MG PO TABS: 300.0000 mg | ORAL_TABLET | Freq: Every day | ORAL | 1 refills | Status: DC

## 2021-06-03 MED ORDER — BUPROPION HCL ER (XL) 300 MG PO TB24: 300.0000 mg | ORAL_TABLET | Freq: Every day | ORAL | 1 refills | Status: DC

## 2021-06-03 MED ORDER — LORAZEPAM 1 MG PO TABS: 1.0000 mg | ORAL_TABLET | Freq: Four times a day (QID) | ORAL | 1 refills | Status: DC | PRN

## 2021-06-03 MED ORDER — TEMAZEPAM 30 MG PO CAPS: 30.0000 mg | ORAL_CAPSULE | Freq: Every evening | ORAL | 1 refills | Status: DC | PRN

## 2021-06-03 NOTE — Telephone Encounter (Signed)
LVM for pt to return call and discuss doses

## 2021-06-03 NOTE — Telephone Encounter (Signed)
Pt stated she has been taking these doses.However some did differ from what was listed in med list.She said while she was in [name] they gave her fluvoxamine 50 mg 5 tabs daily.She has an appt with you on 9/13.I just needed to make sure these doses were ok before I send anything

## 2021-06-10 ENCOUNTER — Other Ambulatory Visit: Payer: Self-pay

## 2021-06-10 ENCOUNTER — Encounter (HOSPITAL_COMMUNITY): Payer: Self-pay | Admitting: Orthopedic Surgery

## 2021-06-10 NOTE — H&P (Signed)
Orthopaedic Trauma Service (OTS) Consult   Patient ID: Casey Diaz MRN: [id number] DOB/AGE: 1966/10/01 55 y.o.    HPI: Casey Diaz is an 56 y.o. female who sustained a right distal humerus fracture approximately 3 weeks ago.  She was initially seen and evaluated by Dr.Weingold but due to the complexity of the injury he was referred to the orthopedic trauma service.  Patient was seen in the outpatient clinic.  Treatment options were discussed with her.  Patient wishes to proceed with surgical intervention.  Anticipate outpatient procedure.  Risks and benefits have been reviewed with the patient and she wishes to proceed.  Patient with history of CP, osteoporosis, anxiety, depression.  Past Medical History:  Diagnosis Date   Abnormal Pap smear 2011   hpv/mild dysplasia,cin1   Anxiety    Cerebral palsy (HCC)    right arm/leg   Cystocele    Depression    Headache    Neuromuscular disorder (HCC)    Cerebral Palsy   OCD (obsessive compulsive disorder)    Osteoporosis    Uterine prolaps     Past Surgical History:  Procedure Laterality Date   COLPOSCOPY  2011   ELBOW SURGERY     left elbow-separation of bones -age 38   ELBOW SURGERY  2015   fell on ice- right elbow fracture   FRACTURE SURGERY     HAMMER TOE SURGERY  1/13   RIGHT SIDE   HIP PINNING,CANNULATED Left 07/08/2016   Procedure: LEFT CLOSED REDUCTION HIP AND PERCUTANEOUS SCREW;  Surgeon: Durene Romans, MD;  Location: [facility] ORS;  Service: Orthopedics;  Laterality: Left;   WRIST SURGERY  2005   left wrist    Family History  Problem Relation Age of Onset   Cancer Father        skin AND LUNG   Alcohol abuse Sister        CRACK COCAINE    Social History:  reports that she has never smoked. She has never used smokeless tobacco. She reports that she does not currently use alcohol. She reports that she does not use drugs.  Allergies:  Allergies  Allergen Reactions   Hydrocodone Itching    Sulfamethoxazole-Trimethoprim Itching   Dust Mite Extract Other (See Comments)    Sneezing, watery eyes, runny nose   Latex Itching   Other Other (See Comments)    PT IS ALLERGIC TO CAT DANDER AND RAGWEED - Sneezing, watery eyes, runny nose    Pollen Extract Other (See Comments)    Sneezing, watery eyes, runny nose     Medications: I have reviewed the patient's current medications. Current Meds  Medication Sig   Azelastine-Fluticasone 137-50 MCG/ACT SUSP Place 1-2 sprays into both nostrils daily.   baclofen (LIORESAL) 10 MG tablet Take 20 mg by mouth at bedtime as needed for muscle spasms.   buPROPion (WELLBUTRIN XL) 300 MG 24 hr tablet Take 1 tablet (300 mg total) by mouth daily.   dicyclomine (BENTYL) 10 MG capsule Take 10 mg by mouth 3 (three) times daily as needed for spasms.   docusate sodium (COLACE) 100 MG capsule Take 1 capsule (100 mg total) by mouth 2 (two) times daily. (Patient taking differently: Take 100 mg by mouth daily.)   fexofenadine (ALLEGRA) 180 MG tablet Take 180 mg by mouth daily.   fluvoxaMINE (LUVOX) 50 MG tablet Take 250 mg by mouth at bedtime.   ketotifen (ZADITOR) 0.025 % ophthalmic solution Place 3 drops into both eyes  at bedtime.   LORazepam (ATIVAN) 1 MG tablet Take 1 tablet (1 mg total) by mouth every 6 (six) hours as needed for anxiety. (Patient taking differently: Take 2 mg by mouth 2 (two) times daily.)   magnesium gluconate (MAGONATE) 500 MG tablet Take 500 mg by mouth daily.   Multiple Vitamins-Minerals (ADULT GUMMY PO) Take 2 tablets by mouth in the morning.   nitrofurantoin (MACRODANTIN) 100 MG capsule Take 100 mg by mouth as needed (For urinary tract infection.).    oxyCODONE-acetaminophen (PERCOCET/ROXICET) 5-325 MG tablet Take 1 tablet by mouth every 4 (four) hours as needed for severe pain.   psyllium (METAMUCIL) 58.6 % powder Take 1 packet by mouth daily as needed (constipation).   temazepam (RESTORIL) 30 MG capsule Take 1 capsule (30 mg total)  by mouth at bedtime as needed for sleep. (Patient taking differently: Take 30 mg by mouth at bedtime.)   Vitamin D-Vitamin K (VITAMIN K2-VITAMIN D3 PO) Take 1-2 sprays by mouth daily.     No results found for this or any previous visit (from the past 48 hour(s)).  No results found.  Intake/Output    None      Review of Systems  Constitutional:  Negative for chills and fever.  Respiratory:  Negative for shortness of breath.   Cardiovascular:  Negative for chest pain and palpitations.  Gastrointestinal:  Negative for nausea and vomiting.  Musculoskeletal:        Right elbow pain  There were no vitals taken for this visit. Physical Exam Constitutional:      General: She is not in acute distress. HENT:     Head: Normocephalic.  Eyes:     Extraocular Movements: Extraocular movements intact.  Cardiovascular:     Rate and Rhythm: Normal rate and regular rhythm.  Pulmonary:     Effort: Pulmonary effort is normal.  Musculoskeletal:     Comments: Right upper extremity Swelling stable Skin stable, no pressure wounds Motor and sensory functions at baseline Ext warm  + Radial pulse   Neurological:     Mental Status: She is alert.    Assessment/Plan:  55 y/o female with R distal humerus fracture, history of R olecranon fracture   - displace, subacute R distal humerus fracture   OR for ORIF   Short course of immobilization then begin ROM   Outpt procedure  Reviewed risks and benefits regarding surgery and patient wishes to proceed  - Dispo:  OR for ORIF right distal humerus   Mearl Latin, PA-C [phone] (C) 06/10/2021, 3:52 PM  [facility] [address] [place] [address] [phone] [phone] (F)    After 5pm and on the weekends please log on to Amion, go to orthopaedics and the look under the [facility] Call for the provider(s) on call. You can also call our office at [phone] and then follow the prompts to  be connected to the call team.

## 2021-06-10 NOTE — Progress Notes (Addendum)
Spoke with pt for pre-op call. Pt denies cardiac history, HTN or diabetes. Pt is treated for OCD, anxiety and depression.   Pt's surgery is scheduled as ambulatory so no Covid test is required prior to surgery.

## 2021-06-10 NOTE — Anesthesia Preprocedure Evaluation (Addendum)
Anesthesia Evaluation  Patient identified by MRN, date of birth, ID band Patient awake    Reviewed: Allergy & Precautions, NPO status , Patient's Chart, lab work & pertinent test results  Airway Mallampati: II  TM Distance: >3 FB Neck ROM: Full    Dental no notable dental hx. (+) Teeth Intact, Dental Advisory Given   Pulmonary    Pulmonary exam normal breath sounds clear to auscultation       Cardiovascular Exercise Tolerance: Good negative cardio ROS Normal cardiovascular exam Rhythm:Regular Rate:Normal     Neuro/Psych  Headaches, PSYCHIATRIC DISORDERS Anxiety Depression Cerebral  Palsy  Neuromuscular disease    GI/Hepatic negative GI ROS, Neg liver ROS,   Endo/Other  negative endocrine ROS  Renal/GU negative Renal ROS     Musculoskeletal negative musculoskeletal ROS (+)   Abdominal   Peds  Hematology negative hematology ROS (+)   Anesthesia Other Findings All: Bactrim, Hydrocodone, latex  Reproductive/Obstetrics                           Anesthesia Physical Anesthesia Plan  ASA: 2  Anesthesia Plan: Regional and General   Post-op Pain Management:    Induction:   PONV Risk Score and Plan: 3 and Treatment may vary due to age or medical condition and Ondansetron  Airway Management Planned: Oral ETT  Additional Equipment: None  Intra-op Plan:   Post-operative Plan: Extubation in OR  Informed Consent: I have reviewed the patients History and Physical, chart, labs and discussed the procedure including the risks, benefits and alternatives for the proposed anesthesia with the patient or authorized representative who has indicated his/her understanding and acceptance.     Dental advisory given  Plan Discussed with:   Anesthesia Plan Comments: (R supraclavicular block + GA)       Anesthesia Quick Evaluation

## 2021-06-11 ENCOUNTER — Encounter (HOSPITAL_COMMUNITY): Payer: Self-pay | Admitting: Orthopedic Surgery

## 2021-06-11 ENCOUNTER — Ambulatory Visit (HOSPITAL_COMMUNITY): Payer: 59 | Admitting: Anesthesiology

## 2021-06-11 ENCOUNTER — Ambulatory Visit (HOSPITAL_COMMUNITY): Payer: 59

## 2021-06-11 ENCOUNTER — Other Ambulatory Visit: Payer: Self-pay

## 2021-06-11 ENCOUNTER — Encounter (HOSPITAL_COMMUNITY)
Admission: RE | Disposition: A | Discharge status: Home / Self Care | Payer: Self-pay | Source: Home / Self Care | Attending: Orthopedic Surgery

## 2021-06-11 ENCOUNTER — Ambulatory Visit: Payer: 59 | Admitting: Psychiatry

## 2021-06-11 ENCOUNTER — Ambulatory Visit (HOSPITAL_COMMUNITY)
Admission: RE | Admit: 2021-06-11 | Discharge: 2021-06-11 | Disposition: A | Discharge status: Home / Self Care | Payer: 59 | Attending: Orthopedic Surgery | Admitting: Orthopedic Surgery

## 2021-06-11 DIAGNOSIS — Z885 Allergy status to narcotic agent status: Secondary | ICD-10-CM | POA: Diagnosis not present

## 2021-06-11 DIAGNOSIS — G709 Myoneural disorder, unspecified: Secondary | ICD-10-CM | POA: Insufficient documentation

## 2021-06-11 DIAGNOSIS — G809 Cerebral palsy, unspecified: Secondary | ICD-10-CM | POA: Diagnosis not present

## 2021-06-11 DIAGNOSIS — Z881 Allergy status to other antibiotic agents status: Secondary | ICD-10-CM | POA: Insufficient documentation

## 2021-06-11 DIAGNOSIS — M1990 Unspecified osteoarthritis, unspecified site: Secondary | ICD-10-CM | POA: Insufficient documentation

## 2021-06-11 DIAGNOSIS — S52021A Displaced fracture of olecranon process without intraarticular extension of right ulna, initial encounter for closed fracture: Secondary | ICD-10-CM | POA: Diagnosis not present

## 2021-06-11 DIAGNOSIS — W1830XA Fall on same level, unspecified, initial encounter: Secondary | ICD-10-CM | POA: Insufficient documentation

## 2021-06-11 DIAGNOSIS — Z419 Encounter for procedure for purposes other than remedying health state, unspecified: Secondary | ICD-10-CM

## 2021-06-11 DIAGNOSIS — T148XXA Other injury of unspecified body region, initial encounter: Secondary | ICD-10-CM

## 2021-06-11 DIAGNOSIS — Z91040 Latex allergy status: Secondary | ICD-10-CM | POA: Diagnosis not present

## 2021-06-11 DIAGNOSIS — Z79899 Other long term (current) drug therapy: Secondary | ICD-10-CM | POA: Diagnosis not present

## 2021-06-11 HISTORY — PX: ORIF HUMERUS FRACTURE: SHX2126

## 2021-06-11 LAB — COMPREHENSIVE METABOLIC PANEL
ALT: 14 U/L (ref 0–44)
AST: 17 U/L (ref 15–41)
Albumin: 3.3 g/dL — ABNORMAL LOW (ref 3.5–5.0)
Alkaline Phosphatase: 141 U/L — ABNORMAL HIGH (ref 38–126)
Anion gap: 5 (ref 5–15)
BUN: 18 mg/dL (ref 6–20)
CO2: 26 mmol/L (ref 22–32)
Calcium: 9.4 mg/dL (ref 8.9–10.3)
Chloride: 107 mmol/L (ref 98–111)
Creatinine, Ser: 0.81 mg/dL (ref 0.44–1.00)
GFR, Estimated: >60 mL/min (ref >60)
Glucose, Bld: 90 mg/dL (ref 70–99)
Potassium: 4 mmol/L (ref 3.5–5.1)
Sodium: 138 mmol/L (ref 135–145)
Total Bilirubin: 0.2 mg/dL — ABNORMAL LOW (ref 0.3–1.2)
Total Protein: 6.5 g/dL (ref 6.5–8.1)

## 2021-06-11 LAB — CBC WITH DIFFERENTIAL/PLATELET
Abs Immature Granulocytes: 0.02 10*3/uL (ref 0.00–0.07)
Basophils Absolute: 0 10*3/uL (ref 0.0–0.1)
Basophils Relative: 1 %
Eosinophils Absolute: 0.1 10*3/uL (ref 0.0–0.5)
Eosinophils Relative: 2 %
HCT: 39.7 % (ref 36.0–46.0)
Hemoglobin: 12.5 g/dL (ref 12.0–15.0)
Immature Granulocytes: 0 %
Lymphocytes Relative: 33 %
Lymphs Abs: 1.9 10*3/uL (ref 0.7–4.0)
MCH: 30.3 pg (ref 26.0–34.0)
MCHC: 31.5 g/dL (ref 30.0–36.0)
MCV: 96.4 fL (ref 80.0–100.0)
Monocytes Absolute: 0.5 10*3/uL (ref 0.1–1.0)
Monocytes Relative: 9 %
Neutro Abs: 3.2 10*3/uL (ref 1.7–7.7)
Neutrophils Relative %: 55 %
Platelets: 299 10*3/uL (ref 150–400)
RBC: 4.12 MIL/uL (ref 3.87–5.11)
RDW: 13.9 % (ref 11.5–15.5)
WBC: 5.8 10*3/uL (ref 4.0–10.5)
nRBC: 0 % (ref 0.0–0.2)

## 2021-06-11 LAB — URINALYSIS, ROUTINE W REFLEX MICROSCOPIC
Bilirubin Urine: NEGATIVE
Glucose, UA: NEGATIVE mg/dL
Hgb urine dipstick: NEGATIVE
Ketones, ur: NEGATIVE mg/dL
Leukocytes,Ua: NEGATIVE
Nitrite: NEGATIVE
Protein, ur: NEGATIVE mg/dL
Specific Gravity, Urine: 1.016 (ref 1.005–1.030)
pH: 6 (ref 5.0–8.0)

## 2021-06-11 LAB — APTT: aPTT: 26 seconds (ref 24–36)

## 2021-06-11 LAB — VITAMIN D 25 HYDROXY (VIT D DEFICIENCY, FRACTURES): Vit D, 25-Hydroxy: 79.62 ng/mL (ref 30–100)

## 2021-06-11 LAB — PROTIME-INR
INR: 0.9 (ref 0.8–1.2)
Prothrombin Time: 12.3 seconds (ref 11.4–15.2)

## 2021-06-11 SURGERY — OPEN REDUCTION INTERNAL FIXATION (ORIF) DISTAL HUMERUS FRACTURE
Anesthesia: Regional | Laterality: Right

## 2021-06-11 MED ORDER — ONDANSETRON HCL 4 MG/2ML IJ SOLN
INTRAMUSCULAR | Status: AC
  Filled 2021-06-11: qty 2

## 2021-06-11 MED ORDER — MELOXICAM 7.5 MG PO TABS
15.0000 mg | ORAL_TABLET | ORAL | Status: AC
  Administered 2021-06-11: 15 mg via ORAL
  Filled 2021-06-11: qty 2

## 2021-06-11 MED ORDER — LIDOCAINE HCL (CARDIAC) PF 100 MG/5ML IV SOSY
PREFILLED_SYRINGE | INTRAVENOUS | Status: DC | PRN
  Administered 2021-06-11: 100 mg via INTRAVENOUS

## 2021-06-11 MED ORDER — LIDOCAINE 2% (20 MG/ML) 5 ML SYRINGE
INTRAMUSCULAR | Status: AC
  Filled 2021-06-11: qty 5

## 2021-06-11 MED ORDER — ROCURONIUM 10MG/ML (10ML) SYRINGE FOR MEDFUSION PUMP - OPTIME
INTRAVENOUS | Status: DC | PRN
  Administered 2021-06-11: 40 mg via INTRAVENOUS

## 2021-06-11 MED ORDER — ACETAMINOPHEN 500 MG PO TABS
1000.0000 mg | ORAL_TABLET | Freq: Once | ORAL | Status: AC
  Administered 2021-06-11: 1000 mg via ORAL
  Filled 2021-06-11: qty 2

## 2021-06-11 MED ORDER — MIDAZOLAM HCL 2 MG/2ML IJ SOLN
INTRAMUSCULAR | Status: DC | PRN
  Administered 2021-06-11: 2 mg via INTRAVENOUS

## 2021-06-11 MED ORDER — 0.9 % SODIUM CHLORIDE (POUR BTL) OPTIME
TOPICAL | Status: DC | PRN
  Administered 2021-06-11: 1000 mL

## 2021-06-11 MED ORDER — LACTATED RINGERS IV SOLN: INTRAVENOUS | Status: DC | PRN

## 2021-06-11 MED ORDER — OXYCODONE-ACETAMINOPHEN 5-325 MG PO TABS: 1.0000 | ORAL_TABLET | Freq: Four times a day (QID) | ORAL | 0 refills | Status: DC | PRN

## 2021-06-11 MED ORDER — SUFENTANIL CITRATE 50 MCG/ML IV SOLN
INTRAVENOUS | Status: AC
  Filled 2021-06-11: qty 1

## 2021-06-11 MED ORDER — SUFENTANIL CITRATE 50 MCG/ML IV SOLN
INTRAVENOUS | Status: DC | PRN
  Administered 2021-06-11: 10 ug via INTRAVENOUS

## 2021-06-11 MED ORDER — FENTANYL CITRATE (PF) 100 MCG/2ML IJ SOLN
25.0000 ug | INTRAMUSCULAR | Status: DC | PRN
  Administered 2021-06-11: 25 ug via INTRAVENOUS

## 2021-06-11 MED ORDER — CHLORHEXIDINE GLUCONATE 0.12 % MT SOLN
15.0000 mL | Freq: Once | OROMUCOSAL | Status: AC
  Administered 2021-06-11: 15 mL via OROMUCOSAL
  Filled 2021-06-11: qty 15

## 2021-06-11 MED ORDER — LACTATED RINGERS IV SOLN: INTRAVENOUS | Status: DC

## 2021-06-11 MED ORDER — FENTANYL CITRATE (PF) 100 MCG/2ML IJ SOLN
INTRAMUSCULAR | Status: AC
  Filled 2021-06-11: qty 2

## 2021-06-11 MED ORDER — DEXAMETHASONE SODIUM PHOSPHATE 10 MG/ML IJ SOLN
INTRAMUSCULAR | Status: AC
  Filled 2021-06-11: qty 1

## 2021-06-11 MED ORDER — GLYCOPYRROLATE 0.2 MG/ML IJ SOLN
INTRAMUSCULAR | Status: DC | PRN
  Administered 2021-06-11: .2 mg via INTRAVENOUS

## 2021-06-11 MED ORDER — DEXAMETHASONE SODIUM PHOSPHATE 10 MG/ML IJ SOLN
INTRAMUSCULAR | Status: DC | PRN
  Administered 2021-06-11: 10 mg via INTRAVENOUS

## 2021-06-11 MED ORDER — ACETAMINOPHEN 10 MG/ML IV SOLN: 1000.0000 mg | Freq: Once | INTRAVENOUS | Status: DC | PRN

## 2021-06-11 MED ORDER — GLYCOPYRROLATE PF 0.2 MG/ML IJ SOSY
PREFILLED_SYRINGE | INTRAMUSCULAR | Status: AC
  Filled 2021-06-11: qty 1

## 2021-06-11 MED ORDER — PHENYLEPHRINE HCL-NACL 20-0.9 MG/250ML-% IV SOLN
INTRAVENOUS | Status: DC | PRN
  Administered 2021-06-11: 50 ug/min via INTRAVENOUS

## 2021-06-11 MED ORDER — PROPOFOL 10 MG/ML IV BOLUS
INTRAVENOUS | Status: DC | PRN
  Administered 2021-06-11: 150 mg via INTRAVENOUS

## 2021-06-11 MED ORDER — ALBUMIN HUMAN 5 % IV SOLN
INTRAVENOUS | Status: AC
  Filled 2021-06-11: qty 250

## 2021-06-11 MED ORDER — ALBUMIN HUMAN 5 % IV SOLN
12.5000 g | Freq: Once | INTRAVENOUS | Status: AC
  Administered 2021-06-11: 12.5 g via INTRAVENOUS

## 2021-06-11 MED ORDER — ROCURONIUM BROMIDE 10 MG/ML (PF) SYRINGE
PREFILLED_SYRINGE | INTRAVENOUS | Status: AC
  Filled 2021-06-11: qty 10

## 2021-06-11 MED ORDER — ONDANSETRON 4 MG PO TBDP: 4.0000 mg | ORAL_TABLET | Freq: Three times a day (TID) | ORAL | 0 refills | Status: DC | PRN

## 2021-06-11 MED ORDER — ACETAMINOPHEN 500 MG PO TABS: 500.0000 mg | ORAL_TABLET | Freq: Two times a day (BID) | ORAL | 0 refills | Status: DC

## 2021-06-11 MED ORDER — ONDANSETRON HCL 4 MG/2ML IJ SOLN
INTRAMUSCULAR | Status: DC | PRN
  Administered 2021-06-11: 4 mg via INTRAVENOUS

## 2021-06-11 MED ORDER — GABAPENTIN 300 MG PO CAPS
300.0000 mg | ORAL_CAPSULE | Freq: Once | ORAL | Status: AC
  Administered 2021-06-11: 300 mg via ORAL
  Filled 2021-06-11: qty 1

## 2021-06-11 MED ORDER — SODIUM CHLORIDE (PF) 0.9 % IJ SOLN
INTRAMUSCULAR | Status: AC
  Filled 2021-06-11: qty 10

## 2021-06-11 MED ORDER — ONDANSETRON HCL 4 MG/2ML IJ SOLN: 4.0000 mg | Freq: Once | INTRAMUSCULAR | Status: DC | PRN

## 2021-06-11 MED ORDER — PROPOFOL 10 MG/ML IV BOLUS
INTRAVENOUS | Status: AC
  Filled 2021-06-11: qty 20

## 2021-06-11 MED ORDER — ORAL CARE MOUTH RINSE: 15.0000 mL | Freq: Once | OROMUCOSAL | Status: AC

## 2021-06-11 MED ORDER — CEFAZOLIN SODIUM-DEXTROSE 2-4 GM/100ML-% IV SOLN
2.0000 g | INTRAVENOUS | Status: AC
  Administered 2021-06-11: 2 g via INTRAVENOUS
  Filled 2021-06-11: qty 100

## 2021-06-11 MED ORDER — ROPIVACAINE HCL 5 MG/ML IJ SOLN
INTRAMUSCULAR | Status: DC | PRN
  Administered 2021-06-11: 150 mg via PERINEURAL

## 2021-06-11 MED ORDER — MIDAZOLAM HCL 2 MG/2ML IJ SOLN
INTRAMUSCULAR | Status: AC
  Filled 2021-06-11: qty 2

## 2021-06-11 MED ORDER — PHENYLEPHRINE HCL (PRESSORS) 10 MG/ML IV SOLN
INTRAVENOUS | Status: DC | PRN
  Administered 2021-06-11: 80 ug via INTRAVENOUS

## 2021-06-11 MED ORDER — SUGAMMADEX SODIUM 200 MG/2ML IV SOLN
INTRAVENOUS | Status: DC | PRN
  Administered 2021-06-11: 200 mg via INTRAVENOUS

## 2021-06-11 SURGICAL SUPPLY — 80 items
BAG COUNTER SPONGE SURGICOUNT (BAG) ×2 IMPLANT
BAG SPNG CNTER NS LX DISP (BAG) ×1
BIT DRILL QC 2.5X135 (BIT) ×1 IMPLANT
BIT DRILL QC 2.8X135 (BIT) ×1 IMPLANT
BIT DRILL QC 2X140 (BIT) ×2 IMPLANT
BLADE AVERAGE 25X9 (BLADE) IMPLANT
BNDG CMPR 9X4 STRL LF SNTH (GAUZE/BANDAGES/DRESSINGS)
BNDG COHESIVE 4X5 TAN STRL (GAUZE/BANDAGES/DRESSINGS) ×2 IMPLANT
BNDG ELASTIC 4X5.8 VLCR STR LF (GAUZE/BANDAGES/DRESSINGS) ×1 IMPLANT
BNDG ESMARK 4X9 LF (GAUZE/BANDAGES/DRESSINGS) IMPLANT
BNDG GAUZE ELAST 4 BULKY (GAUZE/BANDAGES/DRESSINGS) ×3 IMPLANT
BRUSH SCRUB EZ PLAIN DRY (MISCELLANEOUS) ×4 IMPLANT
CORD BIPOLAR FORCEPS 12FT (ELECTRODE) IMPLANT
COVER SURGICAL LIGHT HANDLE (MISCELLANEOUS) ×2 IMPLANT
DRAIN PENROSE 1/4X12 LTX STRL (WOUND CARE) IMPLANT
DRAPE C-ARM 42X72 X-RAY (DRAPES) ×2 IMPLANT
DRAPE C-ARMOR (DRAPES) IMPLANT
DRAPE HALF SHEET 40X57 (DRAPES) IMPLANT
DRAPE INCISE IOBAN 66X45 STRL (DRAPES) IMPLANT
DRAPE ORTHO SPLIT 77X108 STRL (DRAPES) ×2
DRAPE SURG ORHT 6 SPLT 77X108 (DRAPES) ×1 IMPLANT
DRAPE U-SHAPE 47X51 STRL (DRAPES) ×4 IMPLANT
DRSG ADAPTIC 3X8 NADH LF (GAUZE/BANDAGES/DRESSINGS) ×2 IMPLANT
DRSG MEPILEX BORDER 4X12 (GAUZE/BANDAGES/DRESSINGS) ×1 IMPLANT
DRSG MEPITEL 4X7.2 (GAUZE/BANDAGES/DRESSINGS) ×2 IMPLANT
DRSG PAD ABDOMINAL 8X10 ST (GAUZE/BANDAGES/DRESSINGS) ×2 IMPLANT
ELECT REM PT RETURN 9FT ADLT (ELECTROSURGICAL) ×2
ELECTRODE REM PT RTRN 9FT ADLT (ELECTROSURGICAL) ×1 IMPLANT
EVACUATOR 1/8 PVC DRAIN (DRAIN) IMPLANT
GAUZE SPONGE 4X4 12PLY STRL (GAUZE/BANDAGES/DRESSINGS) ×4 IMPLANT
GAUZE SPONGE 4X4 12PLY STRL LF (GAUZE/BANDAGES/DRESSINGS) ×1 IMPLANT
GLOVE SRG 8 PF TXTR STRL LF DI (GLOVE) ×1 IMPLANT
GLOVE SURG ENC MOIS LTX SZ7.5 (GLOVE) ×2 IMPLANT
GLOVE SURG UNDER POLY LF SZ7.5 (GLOVE) ×2 IMPLANT
GLOVE SURG UNDER POLY LF SZ8 (GLOVE) ×2
GOWN STRL REUS W/ TWL LRG LVL3 (GOWN DISPOSABLE) ×1 IMPLANT
GOWN STRL REUS W/ TWL XL LVL3 (GOWN DISPOSABLE) ×2 IMPLANT
GOWN STRL REUS W/TWL LRG LVL3 (GOWN DISPOSABLE) ×2
GOWN STRL REUS W/TWL XL LVL3 (GOWN DISPOSABLE) ×4
KIT BASIN OR (CUSTOM PROCEDURE TRAY) ×2 IMPLANT
KIT TURNOVER KIT B (KITS) ×2 IMPLANT
MANIFOLD NEPTUNE II (INSTRUMENTS) ×2 IMPLANT
NDL HYPO 25X1 1.5 SAFETY (NEEDLE) IMPLANT
NEEDLE HYPO 25X1 1.5 SAFETY (NEEDLE) IMPLANT
NS IRRIG 1000ML POUR BTL (IV SOLUTION) ×2 IMPLANT
PACK ORTHO EXTREMITY (CUSTOM PROCEDURE TRAY) ×2 IMPLANT
PAD ABD 8X10 STRL (GAUZE/BANDAGES/DRESSINGS) ×1 IMPLANT
PAD ARMBOARD 7.5X6 YLW CONV (MISCELLANEOUS) ×4 IMPLANT
PAD CAST 4YDX4 CTTN HI CHSV (CAST SUPPLIES) IMPLANT
PADDING CAST COTTON 4X4 STRL (CAST SUPPLIES) ×2
PLATE MEDIAL 4HOLE RIGHT (Plate) ×1 IMPLANT
SCREW CORTEX 3.5 20MM (Screw) ×1 IMPLANT
SCREW CORTEX 3.5 24MM (Screw) ×1 IMPLANT
SCREW LOCK CORT ST 3.5X20 (Screw) IMPLANT
SCREW LOCK CORT ST 3.5X24 (Screw) IMPLANT
SCREW LOCK T15 FT 22X3.5XST (Screw) IMPLANT
SCREW LOCKING 2.7X16MM VA (Screw) ×1 IMPLANT
SCREW LOCKING 2.7X56MM (Screw) ×1 IMPLANT
SCREW LOCKING 2.7X60MM (Screw) ×2 IMPLANT
SCREW LOCKING 3.5X22 (Screw) ×2 IMPLANT
SCREW LOCKING VA 2.7X46 (Screw) ×1 IMPLANT
SCREW METAPHYSEAL 2.7X58MM (Screw) ×1 IMPLANT
SLING ARM FOAM STRAP MED (SOFTGOODS) ×1 IMPLANT
SPONGE T-LAP 18X18 ~~LOC~~+RFID (SPONGE) IMPLANT
STAPLER VISISTAT 35W (STAPLE) ×2 IMPLANT
STOCKINETTE IMPERVIOUS 9X36 MD (GAUZE/BANDAGES/DRESSINGS) IMPLANT
SUCTION FRAZIER HANDLE 10FR (MISCELLANEOUS) ×2
SUCTION TUBE FRAZIER 10FR DISP (MISCELLANEOUS) ×1 IMPLANT
SUT ETHILON 3 0 PS 1 (SUTURE) ×4 IMPLANT
SUT VIC AB 0 CT1 27 (SUTURE) ×4
SUT VIC AB 0 CT1 27XBRD ANBCTR (SUTURE) ×2 IMPLANT
SUT VIC AB 2-0 CT1 27 (SUTURE) ×4
SUT VIC AB 2-0 CT1 TAPERPNT 27 (SUTURE) ×2 IMPLANT
SYR 5ML LL (SYRINGE) IMPLANT
SYR CONTROL 10ML LL (SYRINGE) IMPLANT
TOWEL GREEN STERILE (TOWEL DISPOSABLE) ×6 IMPLANT
TOWEL GREEN STERILE FF (TOWEL DISPOSABLE) ×2 IMPLANT
TRAY FOLEY MTR SLVR 16FR STAT (SET/KITS/TRAYS/PACK) IMPLANT
WATER STERILE IRR 1000ML POUR (IV SOLUTION) ×2 IMPLANT
YANKAUER SUCT BULB TIP NO VENT (SUCTIONS) IMPLANT

## 2021-06-11 NOTE — Discharge Instructions (Addendum)
[facility] Discharge Instructions   General Discharge Instructions  Orthopaedic Injuries:  Right distal humerus fracture treated with open reduction and internal fixation using plate and screws  WEIGHT BEARING STATUS: no lifting, pushing or pulling with Right arm. You may use your right arm to help feed yourself  RANGE OF MOTION/ACTIVITY: unrestricted motion of Right elbow. Sling for comfort. Come out of the sling at least 3x a day to work on motion.  Would sleep in sling for the first week   Bone health: vitamin d levels look great. Continue with your home regimen  Wound Care:daily wound care starting on 06/13/2021. See below  Discharge Wound Care Instructions  Do NOT apply any ointments, solutions or lotions to pin sites or surgical wounds.  These prevent needed drainage and even though solutions like hydrogen peroxide kill bacteria, they also damage cells lining the pin sites that help fight infection.  Applying lotions or ointments can keep the wounds moist and can cause them to breakdown and open up as well. This can increase the risk for infection. When in doubt call the office.  Surgical incisions should be dressed daily.  If any drainage is noted, use one layer of adaptic, then gauze, Kerlix, and an ace wrap.  Once the incision is completely dry and without drainage, it may be left open to air out.  Showering may begin 36-48 hours later.  Cleaning gently with soap and water.    Diet: as you were eating previously.  Can use over the counter stool softeners and bowel preparations, such as Miralax, to help with bowel movements.  Narcotics can be constipating.  Be sure to drink plenty of fluids  PAIN MEDICATION USE AND EXPECTATIONS  You have likely been given narcotic medications to help control your pain.  After a traumatic event that results in an fracture (broken bone) with or without surgery, it is ok to use narcotic pain medications to help control one's  pain.  We understand that everyone responds to pain differently and each individual patient will be evaluated on a regular basis for the continued need for narcotic medications. Ideally, narcotic medication use should last no more than 6-8 weeks (coinciding with fracture healing).   As a patient it is your responsibility as well to monitor narcotic medication use and report the amount and frequency you use these medications when you come to your office visit.   We would also advise that if you are using narcotic medications, you should take a dose prior to therapy to maximize you participation.  IF YOU ARE ON NARCOTIC MEDICATIONS IT IS NOT PERMISSIBLE TO OPERATE A MOTOR VEHICLE (MOTORCYCLE/CAR/TRUCK/MOPED) OR HEAVY MACHINERY DO NOT MIX NARCOTICS WITH OTHER CNS (CENTRAL NERVOUS SYSTEM) DEPRESSANTS SUCH AS ALCOHOL   POST-OPERATIVE OPIOID TAPER INSTRUCTIONS: It is important to wean off of your opioid medication as soon as possible. If you do not need pain medication after your surgery it is ok to stop day one. Opioids include: Codeine, Hydrocodone(Norco, Vicodin), Oxycodone(Percocet, oxycontin) and hydromorphone amongst others.  Long term and even short term use of opiods can cause: Increased pain response Dependence Constipation Depression Respiratory depression And more.  Withdrawal symptoms can include Flu like symptoms Nausea, vomiting And more Techniques to manage these symptoms Hydrate well Eat regular healthy meals Stay active Use relaxation techniques(deep breathing, meditating, yoga) Do Not substitute Alcohol to help with tapering If you have been on opioids for less than two weeks and do not have pain than it is  ok to stop all together.  Plan to wean off of opioids This plan should start within one week post op of your fracture surgery  Maintain the same interval or time between taking each dose and first decrease the dose.  Cut the total daily intake of opioids by one tablet  each day Next start to increase the time between doses. The last dose that should be eliminated is the evening dose.    STOP SMOKING OR USING NICOTINE PRODUCTS!!!!  As discussed nicotine severely impairs your body's ability to heal surgical and traumatic wounds but also impairs bone healing.  Wounds and bone heal by forming microscopic blood vessels (angiogenesis) and nicotine is a vasoconstrictor (essentially, shrinks blood vessels).  Therefore, if vasoconstriction occurs to these microscopic blood vessels they essentially disappear and are unable to deliver necessary nutrients to the healing tissue.  This is one modifiable factor that you can do to dramatically increase your chances of healing your injury.    (This means no smoking, no nicotine gum, patches, etc)  DO NOT USE NONSTEROIDAL ANTI-INFLAMMATORY DRUGS (NSAID'S)  Using products such as Advil (ibuprofen), Aleve (naproxen), Motrin (ibuprofen) for additional pain control during fracture healing can delay and/or prevent the healing response.  If you would like to take over the counter (OTC) medication, Tylenol (acetaminophen) is ok.  However, some narcotic medications that are given for pain control contain acetaminophen as well. Therefore, you should not exceed more than 4000 mg of tylenol in a day if you do not have liver disease.  Also note that there are may OTC medicines, such as cold medicines and allergy medicines that my contain tylenol as well.  If you have any questions about medications and/or interactions please ask your doctor/PA or your pharmacist.      ICE AND ELEVATE INJURED/OPERATIVE EXTREMITY  Using ice and elevating the injured extremity above your heart can help with swelling and pain control.  Icing in a pulsatile fashion, such as 20 minutes on and 20 minutes off, can be followed.    Do not place ice directly on skin. Make sure there is a barrier between to skin and the ice pack.    Using frozen items such as frozen peas  works well as the conform nicely to the are that needs to be iced.  USE AN ACE WRAP OR TED HOSE FOR SWELLING CONTROL  In addition to icing and elevation, Ace wraps or TED hose are used to help limit and resolve swelling.  It is recommended to use Ace wraps or TED hose until you are informed to stop.    When using Ace Wraps start the wrapping distally (farthest away from the body) and wrap proximally (closer to the body)   Example: If you had surgery on your leg or thing and you do not have a splint on, start the ace wrap at the toes and work your way up to the thigh        If you had surgery on your upper extremity and do not have a splint on, start the ace wrap at your fingers and work your way up to the upper arm  IF YOU ARE IN A SPLINT OR CAST DO NOT REMOVE IT FOR ANY REASON   If your splint gets wet for any reason please contact the office immediately. You may shower in your splint or cast as long as you keep it dry.  This can be done by wrapping in a cast cover or garbage back (  or similar)  Do Not stick any thing down your splint or cast such as pencils, money, or hangers to try and scratch yourself with.  If you feel itchy take benadryl as prescribed on the bottle for itching  IF YOU ARE IN A CAM BOOT (BLACK BOOT)  You may remove boot periodically. Perform daily dressing changes as noted below.  Wash the liner of the boot regularly and wear a sock when wearing the boot. It is recommended that you sleep in the boot until told otherwise    Call office for the following: Temperature greater than 101F Persistent nausea and vomiting Severe uncontrolled pain Redness, tenderness, or signs of infection (pain, swelling, redness, odor or green/yellow discharge around the site) Difficulty breathing, headache or visual disturbances Hives Persistent dizziness or light-headedness Extreme fatigue Any other questions or concerns you may have after discharge  In an emergency, call 911 or go to an  Emergency Department at a nearby hospital  HELPFUL INFORMATION  If you had a block, it will wear off between 8-24 hrs postop typically.  This is period when your pain may go from nearly zero to the pain you would have had postop without the block.  This is an abrupt transition but nothing dangerous is happening.  You may take an extra dose of narcotic when this happens.  You should wean off your narcotic medicines as soon as you are able.  Most patients will be off or using minimal narcotics before their first postop appointment.   We suggest you use the pain medication the first night prior to going to bed, in order to ease any pain when the anesthesia wears off. You should avoid taking pain medications on an empty stomach as it will make you nauseous.  Do not drink alcoholic beverages or take illicit drugs when taking pain medications.  In most states it is against the law to drive while you are in a splint or sling.  And certainly against the law to drive while taking narcotics.  You may return to work/school in the next couple of days when you feel up to it.   Pain medication may make you constipated.  Below are a few solutions to try in this order: Decrease the amount of pain medication if you aren't having pain. Drink lots of decaffeinated fluids. Drink prune juice and/or each dried prunes  If the first 3 don't work start with additional solutions Take Colace - an over-the-counter stool softener Take Senokot - an over-the-counter laxative Take Miralax - a stronger over-the-counter laxative     CALL THE OFFICE WITH ANY QUESTIONS OR CONCERNS: [phone]   VISIT OUR WEBSITE FOR ADDITIONAL INFORMATION: [email or web address]

## 2021-06-11 NOTE — Anesthesia Postprocedure Evaluation (Signed)
Anesthesia Post Note  Patient: Casey Diaz  Procedure(s) Performed: OPEN REDUCTION INTERNAL FIXATION (ORIF) DISTAL HUMERUS FRACTURE (Right)     Patient location during evaluation: PACU Anesthesia Type: Regional and General Level of consciousness: awake and alert Pain management: pain level controlled Vital Signs Assessment: post-procedure vital signs reviewed and stable Respiratory status: spontaneous breathing, nonlabored ventilation, respiratory function stable and patient connected to nasal cannula oxygen Cardiovascular status: blood pressure returned to baseline and stable Postop Assessment: no apparent nausea or vomiting Anesthetic complications: no   No notable events documented.  Last Vitals:  Vitals:   06/11/21 1248 06/11/21 1303  BP: (!) 86/61 107/74  Pulse: 76 87  Resp: 19 17  Temp: 36.7 C   SpO2: 98% 99%    Last Pain:  Vitals:   06/11/21 1248  TempSrc:   PainSc: 0-No pain                 Trevor Iha

## 2021-06-11 NOTE — Transfer of Care (Signed)
Immediate Anesthesia Transfer of Care Note  Patient: Casey Diaz  Procedure(s) Performed: OPEN REDUCTION INTERNAL FIXATION (ORIF) DISTAL HUMERUS FRACTURE (Right)  Patient Location: PACU  Anesthesia Type:GA combined with regional for post-op pain  Level of Consciousness: awake, alert , oriented and patient cooperative  Airway & Oxygen Therapy: Patient Spontanous Breathing and Patient connected to nasal cannula oxygen  Post-op Assessment: Report given to RN, Post -op Vital signs reviewed and stable and Patient moving all extremities X 4  Post vital signs: Reviewed and stable  Last Vitals:  Vitals Value Taken Time  BP 86/61 06/11/21 1248  Temp    Pulse 75 06/11/21 1248  Resp 20 06/11/21 1248  SpO2 100 % 06/11/21 1248  Vitals shown include unvalidated device data.  Last Pain:  Vitals:   06/11/21 0618  TempSrc:   PainSc: 4          Complications: No notable events documented.

## 2021-06-11 NOTE — Anesthesia Procedure Notes (Signed)
Anesthesia Regional Block: Interscalene brachial plexus block   Pre-Anesthetic Checklist: , timeout performed,  Correct Patient, Correct Site, Correct Laterality,  Correct Procedure, Correct Position, site marked,  Risks and benefits discussed,  Surgical consent,  Pre-op evaluation,  At surgeon's request and post-op pain management  Laterality: Upper and Right  Prep: Maximum Sterile Barrier Precautions used, chloraprep       Needles:  Injection technique: Single-shot  Needle Type: Echogenic Needle     Needle Length: 5cm  Needle Gauge: 21     Additional Needles:   Procedures:,,,, ultrasound used (permanent image in chart),,    Narrative:  Start time: 06/11/2021 7:05 AM End time: 06/11/2021 7:15 AM Injection made incrementally with aspirations every 5 mL.  Performed by: Personally  Anesthesiologist: Trevor Iha, MD  Additional Notes: Block assessed prior to procedure. Patient tolerated procedure well.

## 2021-06-11 NOTE — Anesthesia Procedure Notes (Signed)

## 2021-06-14 ENCOUNTER — Encounter (HOSPITAL_COMMUNITY): Payer: Self-pay | Admitting: Orthopedic Surgery

## 2021-06-21 NOTE — Op Note (Signed)
NAME: Casey Diaz MEDICAL RECORD QP:[id number] DATE OF BIRTH:04-Sep-1966 FACILITY: [facility] PHYSICIAN:Lamyiah Crawshaw H. Tamilyn Lupien, MD  OPERATIVE REPORT  DATE OF PROCEDURE:  06/11/2021  PREOPERATIVE DIAGNOSES:   RIGHT TRANSCONDYLAR DISTAL HUMERUS FRACTURE. 2. RETAINED HARDWARE RIGHT OLECRANON  POSTOPERATIVE DIAGNOSES: RIGHT TRANSCONDYLAR DISTAL HUMERUS FRACTURE. 2. RETAINED HARDWARE RIGHT OLECRANON  PROCEDURE:   OPEN REDUCTION INTERNAL FIXATION OF RIGHT TRANSCONDYLAR DISTAL HUMERUS FRACTURE. REMOVAL OF DEEP IMPLANT RIGHT OLECRANON  SURGEON:  Myrene Galas, MD  ASSISTANT:  Montez Morita, PA-C  ANESTHESIA:  General.  COMPLICATIONS:  None.  EBL: 50 mL.  TOURNIQUET:  None.  DISPOSITION:  To PACU.  CONDITION:  Stable.  BRIEF INDICATIONS FOR PROCEDURE:  This is a very pleasant 55 y.o. right-hand dominant patient who sustained a severe intercondylar humerus fracture in a ground-level fall. She presented in a delayed fashion to me and also has underlying contracture from cerebral palsy and prior fracture. I discussed with the patient the risks and benefits of surgical treatment including the possibility of nerve injury, vessel injury, DVT, PE, loss of motion, nonunion, malunion, symptomatic hardware, need for further surgery, heart attack, stroke, death, and multiple others. These risks were acknowledged and consent provided to proceed.    BRIEF SUMMARY OF PROCEDURE:  The patient was taken to the operating room after administration of preoperative antibiotics.  The patient was placed laterally with prominences appropriately padded and the operative upper extremity placed carefully over bone foam. Chlorhexidine wash followed by Betadine scrub and paint were used then a standard drape. A tourniquet was placed about the arm. Time out was called. The arm was then elevated and exsanguinated with an Esmarch bandage.  We decided to perform a posterior approach with mobilization of the triceps without an olecranon  osteotomy if feasible.  A posterior incision was made.  Dissection carried carefully down, with hemostasis using electrocautery.  The triceps was mobilized medially after identification and neuroplasty of the ulnar nerve which was carefully retracted with a quarter inch penrose and facilitated with the bipolar bovie.  An additional lateral incision in the triceps fascia was made as well, and this facilitated  Mobilization laterally.  On the lateral side once this interval had been established, we placed a lap sponge underneath the triceps, mobilizing it, and enabling direct exposure of the fracture site.  The fracture was again a transcondylar split extending up through the medial side.  Bone quality was reduced as anticipated.  Traction and manipulation assisted with restoring length and alignment. I then removed the olecranon plate to improve visibility, as well as for possible osteotomy. All implants would eventually be removed though I left the leg screw initially. No complications with removal but hte incision did have to be extended. Once this had been established, the synthes plate was applied medially with provisional pin placement distally. I manipulated the anterior comminution through an arthrotomy gaining sufficient reduction that the elbow could be ranged fully. I then continued with additional standard fixation proximally and locked fixation distally obtaining outstanding reduction and compression across the articular surface and fixation  with a mixture of standard and locked screw fixation.  Montez Morita, PA-C, was present and assisting throughout.  We were able to take the elbow through a full range of motion with no complications.  Final images showed appropriate reduction, hardware placement, trajectory, and length.  Of note, our assistant was absolutely necessary for control during this very difficult reduction and assistance with maintenance during both provisional and definitive fixation.   Furthermore, he protected the  ulnar nerve throughout the instrumentation. A bulky dressing without splint was applied and the patient taken to the PACU in stable condition.  PROGNOSIS: Casey Diaz will begin gentle PROM, AAROM of the elbow with AROM of the wrist and digits. Ice and elevation with "the hand of above the elbow and the elbow above the heart." Follow  up in the office in 10-14 days for removal of sutures and ROM advancement with therapy. Her risk of heterotopic ossification, loss of reduction, recurrent contracture, and arthritis are significantly elevated.  Myrene Galas, MD

## 2021-06-25 ENCOUNTER — Ambulatory Visit: Payer: 59 | Admitting: Psychiatry

## 2021-06-29 ENCOUNTER — Other Ambulatory Visit: Payer: Self-pay

## 2021-06-29 ENCOUNTER — Encounter: Payer: Self-pay | Admitting: Psychiatry

## 2021-06-29 ENCOUNTER — Ambulatory Visit: Payer: 59 | Admitting: Psychiatry

## 2021-06-29 DIAGNOSIS — F5105 Insomnia due to other mental disorder: Secondary | ICD-10-CM

## 2021-06-29 DIAGNOSIS — F4010 Social phobia, unspecified: Secondary | ICD-10-CM | POA: Diagnosis not present

## 2021-06-29 DIAGNOSIS — F339 Major depressive disorder, recurrent, unspecified: Secondary | ICD-10-CM | POA: Diagnosis not present

## 2021-06-29 DIAGNOSIS — F422 Mixed obsessional thoughts and acts: Secondary | ICD-10-CM

## 2021-06-29 MED ORDER — TEMAZEPAM 30 MG PO CAPS: 30.0000 mg | ORAL_CAPSULE | Freq: Every evening | ORAL | 1 refills | Status: DC | PRN

## 2021-06-29 MED ORDER — BUPROPION HCL ER (XL) 300 MG PO TB24: 300.0000 mg | ORAL_TABLET | Freq: Every day | ORAL | 0 refills | Status: DC

## 2021-06-29 MED ORDER — FLUVOXAMINE MALEATE 100 MG PO TABS: 100.0000 mg | ORAL_TABLET | Freq: Four times a day (QID) | ORAL | 0 refills | Status: DC

## 2021-06-29 MED ORDER — LORAZEPAM 1 MG PO TABS: 2.0000 mg | ORAL_TABLET | Freq: Two times a day (BID) | ORAL | 2 refills | Status: DC

## 2021-06-29 NOTE — Progress Notes (Signed)
[name] [id number] 05-27-1966 55 y.o.    Subjective:   Patient ID:  Casey Diaz is a 56 y.o. (DOB 1966/09/06) female.  Chief Complaint:  Chief Complaint  Patient presents with   Follow-up   Depression   Anxiety     HPI Doral E Schouten presents to the office today for follow-up of OCD and severe anxiety.     December 2019 visit the following was noted: No meds were changed. Lives in [place] and back for followup.  Sx are about the same.  Has to take meds with different sizes. Pt reports that mood is Anxious and Depressed and describes anxiety as Severe. Anxiety symptoms include: Excessive Worry, Obsessive Compulsive Symptoms:   Checking,,. Pt reports has interrupted sleep and nocturia. Pt reports that appetite is good. Pt reports that energy is no change and down slightly. Concentration is down slightly. Suicidal thoughts:  denied by patient. Loves the environment of [place] but misses some things there.  She's not able to work there.  H works there and likes it.  Struggled with not working, feels isolated and not up to task of meeting people.  Does attend a church and met a friend who's been helpful.  Leaving for [place] on 10/16/18.   04/09/2020 appointment the following is noted:  Staying another year in [place] bc Covid and other things. Last few months a lot of crying spells.  Is in menopause. Wonders about med changes though is nervous about it.  Crying spells associated with depressing thoughts more than stress or OCD.   Covid really hard on everyone and couldn't see family for 18 mos.  Family still very dysfunctional. No close friends in part due to OCD and depression. Son high Autism spectrum with ADHD and anxiety and she's with him all the time. Greater health problems with CP so more pains.   05/15/20 appt with the following noted: [name] for menopause and helps some. Still depressed.  Chronically. In [place] for 2 more weeks then to [place] for another  year. A lot of stressors lately triggering more checking and anxiety.   OCD is her CC now and seems.  Got worse DT stress.   Stressed with Asberger's son and her health.  H works a lot.  Her FOO still stress. Plan: Trintellix 10 mg 1 tablet in the morning with food and reduce fluvoxamine to 5 tablets nightly for 1 week  then reduce it to 4 tablets nightly.   07/02/20 appt with the following noted: Decided not to get Trintellix bc difficulty getting it. It is available.  There.  Wants to start it now.   Both depression and OCD are severe.  Not suicidal in intent or plan. Did not take samples with her to [place] but will be back in December. covid is worse there and travel is difficult.  Wants to reduce Wellbutrin DT dry mouth. Plan: She's afraid to reduce Luvox at this time DT fear of worsening OCD.  But will consider. Trintellix 10 mg 1 tablet in the morning with food and reduce fluvoxamine to 5 tablets nightly for 1 week  then reduce it to 4 tablets nightly. Also reduce Wellbutrin XL to 300 mg daily.    9-13 2022 appointment with the following noted: Back in [place] since July 14.  Broke arm a month ago and surgery.  It's all been rough adjustment.   B has cancer on his face and M fell taking him to the doctor.  Misses the water  and weather of [place].   Cry a lot more since menopause. Still depression and anxiety and OCD.  Asks about ketamine. On Wellbutrin 300, Luvox 300.  No Trintellix. Added Ativan 2 mg AM and HS and it helps.  More likely to get upset at night.  Previous psych med trials include Prozac, paroxetine, sertraline, fluvoxamine, venlafaxine, Anafranil with no response,  Wellbutrin, , Viibryd, Trintellix 10 1 month NR Geodon,  risperidone, Rexulti, Abilify,  Seroquel, Latuda 40 mg with irritability. lamotrigine lithium, BuSpar, Namenda,  pramipexole with no response, and Topamax, pindolol  ECT-MADRS    [facility] Office Visit from 06/29/2021 in [place] Total Score 36      Flowsheet Row Admission (Discharged) from 06/11/2021 in [facility] No Risk        Review of Systems:  Review of Systems  HENT:  Negative for dental problem.   Musculoskeletal:  Positive for arthralgias, back pain, gait problem and joint swelling.  Neurological:  Positive for weakness. Negative for tremors.   Medications: I have reviewed the patient's current medications.  Current Outpatient Medications  Medication Sig Dispense Refill   Azelastine-Fluticasone 137-50 MCG/ACT SUSP Place 1-2 sprays into both nostrils daily.     baclofen (LIORESAL) 10 MG tablet Take 20 mg by mouth at bedtime as needed for muscle spasms.     dicyclomine (BENTYL) 10 MG capsule Take 10 mg by mouth daily.     docusate sodium (COLACE) 100 MG capsule Take 1 capsule (100 mg total) by mouth 2 (two) times daily. (Patient taking differently: Take 100 mg by mouth daily.) 10 capsule 0   fexofenadine (ALLEGRA) 180 MG tablet Take 180 mg by mouth daily.     ketotifen (ZADITOR) 0.025 % ophthalmic solution Place 3 drops into both eyes at bedtime.     magnesium gluconate (MAGONATE) 500 MG tablet Take 500 mg by mouth daily.     Multiple Vitamins-Minerals (ADULT GUMMY PO) Take 2 tablets by mouth in the morning.     nitrofurantoin (MACRODANTIN) 100 MG capsule Take 100 mg by mouth as needed (For urinary tract infection.).      ondansetron (ZOFRAN ODT) 4 MG disintegrating tablet Take 1 tablet (4 mg total) by mouth every 8 (eight) hours as needed for nausea or vomiting. 20 tablet 0   polyethylene glycol (MIRALAX / GLYCOLAX) packet Take 17 g by mouth daily as needed for mild constipation. 14 each 0   Vitamin D-Vitamin K (VITAMIN K2-VITAMIN D3 PO) Take 1-2 sprays by mouth daily.     buPROPion (WELLBUTRIN XL) 300 MG 24 hr tablet Take 1 tablet (300 mg total) by mouth daily. 90 tablet 0   fluvoxaMINE (LUVOX) 100 MG tablet Take 1 tablet (100 mg total) by mouth  4 (four) times daily. 360 tablet 0   hydrocortisone (ANUSOL-HC) 2.5 % rectal cream Place rectally 2 (two) times daily. x 7-14 days (Patient not taking: No sig reported) 30 g 0   LORazepam (ATIVAN) 1 MG tablet Take 2 tablets (2 mg total) by mouth 2 (two) times daily. 120 tablet 2   MIBELAS 24 FE 1-20 MG-MCG(24) CHEW Chew 1 tablet by mouth at bedtime as needed (bowel regularity). (Patient not taking: Reported on 06/29/2021)     oxyCODONE-acetaminophen (PERCOCET/ROXICET) 5-325 MG tablet Take 1-2 tablets by mouth every 6 (six) hours as needed for severe pain. (Patient not taking: Reported on 06/29/2021) 50 tablet 0   psyllium (METAMUCIL) 58.6 % powder Take 1  packet by mouth daily as needed (constipation). (Patient not taking: Reported on 06/29/2021)     temazepam (RESTORIL) 30 MG capsule Take 1 capsule (30 mg total) by mouth at bedtime as needed for sleep. 30 capsule 1   No current facility-administered medications for this visit.    Medication Side Effects: None   Allergies:  Allergies  Allergen Reactions   Hydrocodone Itching   Sulfamethoxazole-Trimethoprim Itching   Dust Mite Extract Other (See Comments)    Sneezing, watery eyes, runny nose   Latex Itching   Other Other (See Comments)    PT IS ALLERGIC TO CAT DANDER AND RAGWEED - Sneezing, watery eyes, runny nose    Pollen Extract Other (See Comments)    Sneezing, watery eyes, runny nose     Past Medical History:  Diagnosis Date   Abnormal Pap smear 2011   hpv/mild dysplasia,cin1   Anxiety    Cerebral palsy (HCC)    right arm/leg   Cystocele    Depression    Headache    Neuromuscular disorder (HCC)    Cerebral Palsy   OCD (obsessive compulsive disorder)    Osteoporosis    Uterine prolaps     Family History  Problem Relation Age of Onset   Cancer Father        skin AND LUNG   Alcohol abuse Sister        CRACK COCAINE    Social History   Socioeconomic History   Marital status: Married    Spouse name: Not on file    Number of children: Not on file   Years of education: Not on file   Highest education level: Not on file  Occupational History   Not on file  Tobacco Use   Smoking status: Never   Smokeless tobacco: Never  Substance and Sexual Activity   Alcohol use: Not Currently    Comment: OCCASIONAL beer   Drug use: No   Sexual activity: Yes    Birth control/protection: Pill    Comment: LOESTRIN 24 FE  Other Topics Concern   Not on file  Social History Narrative   Not on file   Social Determinants of Health   Financial Resource Strain: Not on file  Food Insecurity: Not on file  Transportation Needs: Not on file  Physical Activity: Not on file  Stress: Not on file  Social Connections: Not on file  Intimate Partner Violence: Not on file    Past Medical History, Surgical history, Social history, and Family history were reviewed and updated as appropriate.   Please see review of systems for further details on the patient's review from today.   Objective:   Physical Exam:  LMP  (LMP Unknown)   Physical Exam Neurological:     Mental Status: She is alert and oriented to person, place, and time.     Cranial Nerves: No dysarthria.     Motor: Weakness present.     Gait: Gait abnormal.  Psychiatric:        Attention and Perception: Attention and perception normal.        Mood and Affect: Mood is anxious and depressed.        Speech: Speech is rapid and pressured.        Behavior: Behavior is slowed and withdrawn. Behavior is cooperative.        Thought Content: Thought content normal. Thought content is not paranoid or delusional. Thought content does not include homicidal or suicidal ideation. Thought content does not include  homicidal or suicidal plan.        Cognition and Memory: Cognition and memory normal.        Judgment: Judgment normal.     Comments: Insight intact Ongoing OCD remains fairly severe Chronic depression    Lab Review:     Component Value Date/Time   NA 138  06/11/2021 0606   K 4.0 06/11/2021 0606   CL 107 06/11/2021 0606   CO2 26 06/11/2021 0606   GLUCOSE 90 06/11/2021 0606   BUN 18 06/11/2021 0606   CREATININE 0.81 06/11/2021 0606   CALCIUM 9.4 06/11/2021 0606   PROT 6.5 06/11/2021 0606   ALBUMIN 3.3 (L) 06/11/2021 0606   AST 17 06/11/2021 0606   ALT 14 06/11/2021 0606   ALKPHOS 141 (H) 06/11/2021 0606   BILITOT 0.2 (L) 06/11/2021 0606   GFRNONAA >60 06/11/2021 0606   GFRAA >60 07/09/2016 0438       Component Value Date/Time   WBC 5.8 06/11/2021 0606   RBC 4.12 06/11/2021 0606   HGB 12.5 06/11/2021 0606   HCT 39.7 06/11/2021 0606   PLT 299 06/11/2021 0606   MCV 96.4 06/11/2021 0606   MCH 30.3 06/11/2021 0606   MCHC 31.5 06/11/2021 0606   RDW 13.9 06/11/2021 0606   LYMPHSABS 1.9 06/11/2021 0606   MONOABS 0.5 06/11/2021 0606   EOSABS 0.1 06/11/2021 0606   BASOSABS 0.0 06/11/2021 0606    No results found for: POCLITH, LITHIUM   No results found for: PHENYTOIN, PHENOBARB, VALPROATE, CBMZ   .res Assessment: Plan:    Eowyn was seen today for follow-up, depression and anxiety.  Diagnoses and all orders for this visit:  Recurrent major depression resistant to treatment ([place]) -     buPROPion (WELLBUTRIN XL) 300 MG 24 hr tablet; Take 1 tablet (300 mg total) by mouth daily. -     fluvoxaMINE (LUVOX) 100 MG tablet; Take 1 tablet (100 mg total) by mouth 4 (four) times daily.  Mixed obsessional thoughts and acts -     fluvoxaMINE (LUVOX) 100 MG tablet; Take 1 tablet (100 mg total) by mouth 4 (four) times daily.  Social anxiety disorder -     LORazepam (ATIVAN) 1 MG tablet; Take 2 tablets (2 mg total) by mouth 2 (two) times daily.  Insomnia due to mental condition -     temazepam (RESTORIL) 30 MG capsule; Take 1 capsule (30 mg total) by mouth at bedtime as needed for sleep.   Options for TR sx.  Both Dx are TR and marked.  Impaired.  Her depression seems worse.  Perhaps some previous failed meds could be tried in high dosages.   Answered questions about ketamine and TMS in detail.  Ketamine not very available at this time but might help both.  TMS more available but no TMS coil for OCD in [place].  We discussed side effects of ketamine and TMS.  Disc alternative for TR sx including Trintellix, Vraylar. Disc using the unusual combo of Trintellix and lower dose Fluvoxamine.   Increase Luvox back to 400 mg daily.  She thinks she's worse on less. Continue Wellbutrin XL to 300 mg daily.   Disc Spravato DT TRD incl details and SE. Disc dosing and duration.  Pt with severe depression MADRS 36 on9/13/22  Disc effects of perimenopause on sx and risks of estrogen.    Ativan and luvox could contribute to the fatigue but these appear necessary.   We discussed the short-term risks associated with benzodiazepines including sedation and  increased fall risk among others.  Discussed long-term side effect risk including dependence, potential withdrawal symptoms, and the potential eventual dose-related risk of dementia.   Follow-up 1 month   Lynder Parents, MD, DFAPA  Please see After Visit Summary for patient specific instructions.  Future Appointments  Date Time Provider [place]  07/09/2021 11:00 AM Blanchie Serve, PhD CP-[facility] None  07/22/2021  1:00 PM Blanchie Serve, PhD CP-[facility] None  08/04/2021  1:00 PM Blanchie Serve, PhD CP-CP None    No orders of the defined types were placed in this encounter.   -------------------------------

## 2021-07-01 ENCOUNTER — Telehealth: Payer: Self-pay

## 2021-07-01 NOTE — Telephone Encounter (Signed)
Patient is requesting to speak to [facility] regarding appointment time and additional information. Please advise [phone]. Thank you.

## 2021-07-05 ENCOUNTER — Other Ambulatory Visit: Payer: Self-pay | Admitting: Psychiatry

## 2021-07-05 ENCOUNTER — Telehealth: Payer: Self-pay | Admitting: Psychiatry

## 2021-07-05 MED ORDER — BUPROPION HCL ER (XL) 150 MG PO TB24: ORAL_TABLET | ORAL | 0 refills | Status: DC

## 2021-07-05 NOTE — Telephone Encounter (Signed)
Please let me know what dose to send if you agree with increase

## 2021-07-05 NOTE — Telephone Encounter (Signed)
Per her request: Increase bupropion to 450 mg each AM as 1 of 300 mg + 1 of 150 mg tabs or 3 of 150 mg tabs

## 2021-07-05 NOTE — Telephone Encounter (Signed)
LVM with info

## 2021-07-05 NOTE — Telephone Encounter (Signed)
Pt would like to increase the dose on Buproprion as you have discussed at last visit. Is this ok? [facility]  [address]

## 2021-07-09 ENCOUNTER — Ambulatory Visit: Payer: 59 | Admitting: Psychiatry

## 2021-07-14 NOTE — Telephone Encounter (Signed)
07/01/21- call to patient & his sister-Lateefah to relay pathology report per Dr. 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 Arita Miss - the reconstruction/closure procedure can be scheduled when available. His sister will coordinate this procedure with our scheduling staff.

## 2021-07-22 ENCOUNTER — Other Ambulatory Visit: Payer: Self-pay

## 2021-07-22 ENCOUNTER — Ambulatory Visit (INDEPENDENT_AMBULATORY_CARE_PROVIDER_SITE_OTHER): Payer: 59 | Admitting: Psychiatry

## 2021-07-22 ENCOUNTER — Telehealth: Payer: Self-pay | Admitting: Psychiatry

## 2021-07-22 DIAGNOSIS — F339 Major depressive disorder, recurrent, unspecified: Secondary | ICD-10-CM

## 2021-07-22 DIAGNOSIS — G809 Cerebral palsy, unspecified: Secondary | ICD-10-CM

## 2021-07-22 DIAGNOSIS — F4010 Social phobia, unspecified: Secondary | ICD-10-CM | POA: Diagnosis not present

## 2021-07-22 DIAGNOSIS — F422 Mixed obsessional thoughts and acts: Secondary | ICD-10-CM | POA: Diagnosis not present

## 2021-07-22 NOTE — Telephone Encounter (Signed)
Pt has refills at pharmacy
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
## 2021-07-22 NOTE — Telephone Encounter (Signed)
[name] is requesting a refill on her Lorazepam called to:  [id number] [place], [place] - [address]  Phone:  [phone]  Fax:  [phone]

## 2021-07-29 ENCOUNTER — Other Ambulatory Visit: Payer: Self-pay | Admitting: Psychiatry

## 2021-07-29 DIAGNOSIS — F339 Major depressive disorder, recurrent, unspecified: Secondary | ICD-10-CM

## 2021-08-04 ENCOUNTER — Ambulatory Visit: Payer: 59 | Admitting: Psychiatry

## 2021-08-06 ENCOUNTER — Telehealth: Payer: Self-pay | Admitting: Psychiatry

## 2021-08-06 NOTE — Telephone Encounter (Signed)
Pt has refill on file

## 2021-08-06 NOTE — Telephone Encounter (Signed)
Patient needs refill for tamazepam 30mg . Ph: [phone]. Appt 11/22. [facility]acy [facility] [address] [place], [place]

## 2021-08-13 ENCOUNTER — Other Ambulatory Visit: Payer: Self-pay

## 2021-08-13 ENCOUNTER — Ambulatory Visit (INDEPENDENT_AMBULATORY_CARE_PROVIDER_SITE_OTHER): Payer: 59 | Admitting: Psychiatry

## 2021-08-13 DIAGNOSIS — F422 Mixed obsessional thoughts and acts: Secondary | ICD-10-CM | POA: Diagnosis not present

## 2021-08-13 DIAGNOSIS — F339 Major depressive disorder, recurrent, unspecified: Secondary | ICD-10-CM

## 2021-08-13 DIAGNOSIS — F4010 Social phobia, unspecified: Secondary | ICD-10-CM | POA: Diagnosis not present

## 2021-08-13 DIAGNOSIS — Z62810 Personal history of physical and sexual abuse in childhood: Secondary | ICD-10-CM

## 2021-08-13 DIAGNOSIS — G809 Cerebral palsy, unspecified: Secondary | ICD-10-CM | POA: Diagnosis not present
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
## 2021-08-18 ENCOUNTER — Other Ambulatory Visit: Payer: Self-pay

## 2021-08-18 MED ORDER — BUPROPION HCL ER (XL) 300 MG PO TB24: ORAL_TABLET | ORAL | 1 refills | Status: DC

## 2021-08-18 NOTE — Progress Notes (Signed)
Clarification on pt's Wellbutrin XL, she is currently taking 300 mg daily. Med list updated.

## 2021-08-20 ENCOUNTER — Other Ambulatory Visit: Payer: Self-pay | Admitting: Psychiatry

## 2021-08-20 DIAGNOSIS — F339 Major depressive disorder, recurrent, unspecified: Secondary | ICD-10-CM

## 2021-08-20 DIAGNOSIS — F422 Mixed obsessional thoughts and acts: Secondary | ICD-10-CM

## 2021-08-23 ENCOUNTER — Other Ambulatory Visit: Payer: Self-pay

## 2021-08-23 ENCOUNTER — Telehealth: Payer: Self-pay | Admitting: Psychiatry

## 2021-08-23 DIAGNOSIS — F5105 Insomnia due to other mental disorder: Secondary | ICD-10-CM

## 2021-08-23 DIAGNOSIS — F339 Major depressive disorder, recurrent, unspecified: Secondary | ICD-10-CM

## 2021-08-23 DIAGNOSIS — F422 Mixed obsessional thoughts and acts: Secondary | ICD-10-CM

## 2021-08-23 MED ORDER — FLUVOXAMINE MALEATE 100 MG PO TABS: 300.0000 mg | ORAL_TABLET | Freq: Every day | ORAL | 0 refills | Status: DC

## 2021-08-23 MED ORDER — BUPROPION HCL ER (XL) 300 MG PO TB24: ORAL_TABLET | ORAL | 1 refills | Status: DC

## 2021-08-23 MED ORDER — TEMAZEPAM 30 MG PO CAPS: 30.0000 mg | ORAL_CAPSULE | Freq: Every evening | ORAL | 0 refills | Status: DC | PRN

## 2021-08-23 NOTE — Telephone Encounter (Signed)
Pended.

## 2021-08-23 NOTE — Telephone Encounter (Signed)
Pt called and said that she has [facility] to [facility]. She needs her luvox, wellbutrin xl 300 mg and her temazapam sent there. She has not picked up any scripts at [facility] so you can just cancel at [facility].

## 2021-08-26 ENCOUNTER — Telehealth: Payer: Self-pay | Admitting: Psychiatry

## 2021-08-26 NOTE — Telephone Encounter (Signed)
Patient no longer on 150 mg, just 300. Notified her.

## 2021-08-26 NOTE — Telephone Encounter (Signed)
[name] said that [facility] called her and said that they never got a script for her Bupropion 150 mg. They did get the RX for Bupropion 300 mg though. Can the 150 be sent to:  [facility] - [place], [place] - [address]  Phone:  [phone]  Fax:  [phone]

## 2021-09-03 ENCOUNTER — Telehealth: Payer: Self-pay | Admitting: Psychiatry

## 2021-09-03 NOTE — Telephone Encounter (Signed)
Next visit is 09/07/21. [place] with [facility] called stating that Casey Diaz called requesting a refill on her Lorazepam 1 mg.   [facility] - [place], [place] - [address] Phone:  [phone]         Phone:  [phone]  Fax:  [phone]

## 2021-09-03 NOTE — Telephone Encounter (Signed)
Regarding previous message patient called and said she received her Lorazepam a few days ago and doesn't need this one sent in.

## 2021-09-03 NOTE — Telephone Encounter (Signed)
Addendum to previous message attached. [name] called and spoke to [place] and told her that she just got her Lorazepam 1 mg in the mail already. Please disregard the attached note from [facility]. Script no longer needed as patient received medication from [facility] a couple of days ago.

## 2021-09-07 ENCOUNTER — Other Ambulatory Visit: Payer: Self-pay

## 2021-09-07 ENCOUNTER — Telehealth: Payer: Self-pay | Admitting: Psychiatry

## 2021-09-07 ENCOUNTER — Ambulatory Visit: Payer: 59 | Admitting: Psychiatry

## 2021-09-07 DIAGNOSIS — F4010 Social phobia, unspecified: Secondary | ICD-10-CM

## 2021-09-07 DIAGNOSIS — F5105 Insomnia due to other mental disorder: Secondary | ICD-10-CM

## 2021-09-07 NOTE — Telephone Encounter (Signed)
Pended.

## 2021-09-07 NOTE — Telephone Encounter (Signed)
We had to Rs [name]'s appt from today to 11/09/21.  She needs refills on her Wellbutrin 300mg , temazepam, and Lorazepam.  She should be ok with her Luvox until late December.  She will be out of Lorazepam in a few days so it especially important this gets ordered.  She wants all prescription be 90 day and sent to [facility] mail [facility].  And she will need refills to get her to her new appt.

## 2021-09-08 ENCOUNTER — Telehealth: Payer: Self-pay | Admitting: Psychiatry

## 2021-09-08 ENCOUNTER — Other Ambulatory Visit: Payer: Self-pay

## 2021-09-08 MED ORDER — LORAZEPAM 1 MG PO TABS: 2.0000 mg | ORAL_TABLET | Freq: Two times a day (BID) | ORAL | 0 refills | Status: DC

## 2021-09-08 MED ORDER — TEMAZEPAM 30 MG PO CAPS: 30.0000 mg | ORAL_CAPSULE | Freq: Every evening | ORAL | 1 refills | Status: DC | PRN

## 2021-09-08 MED ORDER — LORAZEPAM 1 MG PO TABS: 2.0000 mg | ORAL_TABLET | Freq: Two times a day (BID) | ORAL | 2 refills | Status: DC

## 2021-09-08 NOTE — Telephone Encounter (Signed)
Ok talked to her again.  She did not get the Lorazepam on 11/18 from [facility].  She got the other meds.  The additional info on this message is that Maralyn Sago advised her that it may take longer than 10 days to get the Prior Auth.  If so, she needs however many days you think until this is resolved with [facility].  Pls call her today as she is concerned about not getting this before the holiday.

## 2021-09-08 NOTE — Telephone Encounter (Signed)
Pt called today stating that [facility] has not received a prior auth from [place] yet.  You can call them at [phone].  While I was on the phone with the patient, I could hear the [facility] person tell her they didn't have it and that she needed to request 10 days worth of meds from a local retail pharmacy while [facility] waits to get and process the Prior Auth. Pt would [facility] [address] [place], [place].  I'm confused because on 11/18 encounter, it seems like she said she had gotten the meds [facility].Marland Kitchen.(I only saw that after I got ready to make this message).  Next appt 1/24

## 2021-09-08 NOTE — Telephone Encounter (Signed)
Rx for Lorazepam 1 mg take 2 tablets bid #40 [facility] [address] while waiting on mail order from [facility]

## 2021-09-13 ENCOUNTER — Other Ambulatory Visit: Payer: Self-pay

## 2021-09-13 ENCOUNTER — Ambulatory Visit (INDEPENDENT_AMBULATORY_CARE_PROVIDER_SITE_OTHER): Payer: 59 | Admitting: Psychiatry

## 2021-09-13 DIAGNOSIS — F4010 Social phobia, unspecified: Secondary | ICD-10-CM | POA: Diagnosis not present

## 2021-09-13 DIAGNOSIS — Z62810 Personal history of physical and sexual abuse in childhood: Secondary | ICD-10-CM

## 2021-09-13 DIAGNOSIS — F422 Mixed obsessional thoughts and acts: Secondary | ICD-10-CM

## 2021-09-13 DIAGNOSIS — G809 Cerebral palsy, unspecified: Secondary | ICD-10-CM

## 2021-09-13 DIAGNOSIS — F339 Major depressive disorder, recurrent, unspecified: Secondary | ICD-10-CM

## 2021-09-13 MED ORDER — SPRAVATO (84 MG DOSE) 28 MG/DEVICE NA SOPK: PACK | NASAL | 3 refills | Status: DC

## 2021-09-13 MED ORDER — SPRAVATO (56 MG DOSE) 28 MG/DEVICE NA SOPK: PACK | NASAL | 0 refills | Status: DC
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
## 2021-09-16 NOTE — Telephone Encounter (Signed)
Casey Diaz called and said [facility] told her that they need a new PA since there was an increase in her dose of Lorazepam.  They were to fax the request for the PA today, but if we don't get it, please do the PA so can ship her medication.

## 2021-09-16 NOTE — Telephone Encounter (Signed)
Prior Approval received for LORAZEPAM 1 MG #120/30 DAY effective 08/17/2021-09/16/2022

## 2021-09-16 NOTE — Telephone Encounter (Signed)
Pt informed

## 2021-09-16 NOTE — Telephone Encounter (Signed)
Please review

## 2021-09-16 NOTE — Telephone Encounter (Signed)
PA submitted with [facility] for LORAZEPAM 1 MG #120/30 DAY, pending at this time.

## 2021-09-16 NOTE — Telephone Encounter (Signed)
Noted will submit PA 

## 2021-09-21 ENCOUNTER — Telehealth: Payer: Self-pay | Admitting: Psychiatry

## 2021-09-21 NOTE — Telephone Encounter (Signed)
Please review

## 2021-09-21 NOTE — Telephone Encounter (Signed)
Casey Diaz at [facility] LVM stating they were calling to provide benefit info regarding the pt. Pls call them back at [phone] M-F 8am-8pm EST

## 2021-09-21 NOTE — Telephone Encounter (Signed)
Casey Diaz has already discussed this with them

## 2021-09-22 ENCOUNTER — Other Ambulatory Visit: Payer: Self-pay

## 2021-09-22 ENCOUNTER — Other Ambulatory Visit: Payer: Self-pay | Admitting: Psychiatry

## 2021-09-22 ENCOUNTER — Telehealth: Payer: Self-pay | Admitting: Psychiatry

## 2021-09-22 MED ORDER — LORAZEPAM 1 MG PO TABS: 2.0000 mg | ORAL_TABLET | Freq: Two times a day (BID) | ORAL | 0 refills | Status: DC

## 2021-09-22 MED ORDER — BUPROPION HCL ER (XL) 300 MG PO TB24: ORAL_TABLET | ORAL | 1 refills | Status: DC

## 2021-09-22 NOTE — Telephone Encounter (Signed)
I sent it.  But in the future go ahead and pinned this kind of request.

## 2021-09-22 NOTE — Telephone Encounter (Signed)
[name] called to once again request a local 10 day refill of her Lorazepam.  [facility] still has not sent her medication but have verified with her that they will now be sending it.  She should get it in the next 7-10 days.  [facility] [address]

## 2021-09-22 NOTE — Telephone Encounter (Signed)
Please review

## 2021-09-22 NOTE — Telephone Encounter (Signed)
Rx sent 

## 2021-09-22 NOTE — Telephone Encounter (Signed)
[facility] LM on VM requesting  Rx for Bupropion XL 300 MG. Pt will be out soon. Contact pharmacy @ [phone] with any questions.

## 2021-09-22 NOTE — Telephone Encounter (Signed)
Pt called back to advise she is completely out.  Hopefully it can be filled today since she called this morning and spoke to [place].

## 2021-09-27 ENCOUNTER — Other Ambulatory Visit: Payer: Self-pay | Admitting: Psychiatry

## 2021-09-27 ENCOUNTER — Ambulatory Visit: Payer: 59

## 2021-09-27 ENCOUNTER — Other Ambulatory Visit: Payer: Self-pay

## 2021-09-27 ENCOUNTER — Ambulatory Visit (INDEPENDENT_AMBULATORY_CARE_PROVIDER_SITE_OTHER): Payer: 59 | Admitting: Psychiatry

## 2021-09-27 VITALS — BP 109/65 | HR 86

## 2021-09-27 DIAGNOSIS — F4010 Social phobia, unspecified: Secondary | ICD-10-CM

## 2021-09-27 DIAGNOSIS — F339 Major depressive disorder, recurrent, unspecified: Secondary | ICD-10-CM | POA: Diagnosis not present

## 2021-09-27 DIAGNOSIS — F5105 Insomnia due to other mental disorder: Secondary | ICD-10-CM

## 2021-09-27 DIAGNOSIS — F422 Mixed obsessional thoughts and acts: Secondary | ICD-10-CM

## 2021-09-27 DIAGNOSIS — F331 Major depressive disorder, recurrent, moderate: Secondary | ICD-10-CM

## 2021-09-27 NOTE — Telephone Encounter (Signed)
Do we need to send ?

## 2021-09-28 ENCOUNTER — Encounter: Payer: Self-pay | Admitting: Psychiatry

## 2021-09-28 ENCOUNTER — Ambulatory Visit: Payer: 59 | Admitting: Psychiatry

## 2021-09-28 NOTE — Progress Notes (Signed)
Casey Diaz [id number] 07/01/1966 55 y.o.    Subjective:   Patient ID:  Casey Diaz is a 55 y.o. (DOB [birth year]) female.  Chief Complaint:  Chief Complaint  Patient presents with   Follow-up   Depression   Anxiety     HPI Casey Diaz presents to the office today for follow-up of OCD and severe anxiety.     December 2019 visit the following was noted: No meds were changed. Lives in [place] and back for followup.  Sx are about the same.  Has to take meds with different sizes. Pt reports that mood is Anxious and Depressed and describes anxiety as Severe. Anxiety symptoms include: Excessive Worry, Obsessive Compulsive Symptoms:   Checking,,. Pt reports has interrupted sleep and nocturia. Pt reports that appetite is good. Pt reports that energy is no change and down slightly. Concentration is down slightly. Suicidal thoughts:  denied by patient. Loves the environment of [place] but misses some things there.  She's not able to work there.  H works there and likes it.  Struggled with not working, feels isolated and not up to task of meeting people.  Does attend a church and met a friend who's been helpful.  Leaving for [place] on 10/16/18.   04/09/2020 appointment the following is noted:  Staying another year in [place] bc Covid and other things. Last few months a lot of crying spells.  Is in menopause. Wonders about med changes though is nervous about it.  Crying spells associated with depressing thoughts more than stress or OCD.   Covid really hard on everyone and couldn't see family for 18 mos.  Family still very dysfunctional. No close friends in part due to OCD and depression. Son high Autism spectrum with ADHD and anxiety and she's with him all the time. Greater health problems with CP so more pains.   05/15/20 appt with the following noted: Casey Diaz for menopause and helps some. Still depressed.  Chronically. In [place] for 2 more weeks then to [place] for another  year. A lot of stressors lately triggering more checking and anxiety.   OCD is her CC now and seems.  Got worse DT stress.   Stressed with Asberger's son and her health.  H works a lot.  Her FOO still stress. Plan: Trintellix 10 mg 1 tablet in the morning with food and reduce fluvoxamine to 5 tablets nightly for 1 week  then reduce it to 4 tablets nightly.   07/02/20 appt with the following noted: Decided not to get Trintellix bc difficulty getting it. It is available.  There.  Wants to start it now.   Both depression and OCD are severe.  Not suicidal in intent or plan. Did not take samples with her to [place] but will be back in December. covid is worse there and travel is difficult.  Wants to reduce Wellbutrin DT dry mouth. Plan: She's afraid to reduce Luvox at this time DT fear of worsening OCD.  But will consider. Trintellix 10 mg 1 tablet in the morning with food and reduce fluvoxamine to 5 tablets nightly for 1 week  then reduce it to 4 tablets nightly. Also reduce Wellbutrin XL to 300 mg daily.    9-13 2022 appointment with the following noted: Back in [place] since July 14.  Broke arm a month ago and surgery.  It's all been rough adjustment.   Casey Diaz has cancer on his face and M fell taking him to the doctor.  Misses the water  and weather of [place].   Cry a lot more since menopause. Still depression and anxiety and OCD.  Asks about ketamine. On Wellbutrin 300, Luvox 300.  No Trintellix. Added Ativan 2 mg AM and HS and it helps.  More likely to get upset at night. Plan: Increase Luvox back to 400 mg daily.  She thinks she's worse on less. Continue Wellbutrin XL to 300 mg daily. 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  She tolerated it well without unusual nausea or vomiting headache or other somatic symptoms.  She did have the expected dissociation which gradually resolved over the course  of the 2-hour period of observation.  She was a little concerned about her balance given her cerebral palsy but has not noted unusual or unexpected problems.  She is motivated to can continue Spravato in hopes of reducing her depressive symptoms.  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  She also has treatment resistant OCD which is partially managed with medications but is still quite disabling.  She is tolerating the medications well.  She is sleeping adequately.  Her appetite is adequate.  She is not having suicidal thoughts.  She continues to wish for a better treatment for OCD that would give her some relief.  Previous psych med trials include Prozac, paroxetine, sertraline, fluvoxamine, venlafaxine, Anafranil with no response,  Wellbutrin, , Viibryd, Trintellix 10 1 month NR Geodon,  risperidone, Rexulti, Abilify,  Seroquel, Latuda 40 mg with irritability. lamotrigine lithium, BuSpar, Namenda,  pramipexole with no response, and Topamax, pindolol  ECT-MADRS    [facility] Office Visit from 06/29/2021 in [facility] Total Score 36      Flowsheet Row Admission (Discharged) from 06/11/2021 in [facility] No Risk        Review of Systems:  Review of Systems  HENT:  Negative for dental problem.   Cardiovascular:  Negative for palpitations.  Musculoskeletal:  Positive for arthralgias, back pain, gait problem and joint swelling.  Neurological:  Positive for weakness. Negative for tremors.   Medications: I have reviewed the patient's current medications.  Current Outpatient Medications  Medication Sig Dispense Refill   Azelastine-Fluticasone 137-50 MCG/ACT SUSP Place 1-2 sprays into both nostrils daily.     baclofen (LIORESAL) 10 MG tablet Take 20 mg by mouth at bedtime as needed for muscle spasms.     buPROPion (WELLBUTRIN XL) 300 MG 24 hr tablet Take 1 tablet BY MOUTH EVERY MORNING  30 tablet 1   dicyclomine (BENTYL) 10 MG capsule Take 10 mg by mouth daily.     docusate sodium (COLACE) 100 MG capsule Take 1 capsule (100 mg total) by mouth 2 (two) times daily. (Patient taking differently: Take 100 mg by mouth daily.) 10 capsule 0   Esketamine HCl, 56 MG Dose, (SPRAVATO, 56 MG DOSE,) 28 MG/DEVICE SOPK Administer 56 mg ( 2 of the 28 mg) intranasally as directed 2 each 0   Esketamine HCl, 84 MG Dose, (SPRAVATO, 84 MG DOSE,) 28 MG/DEVICE SOPK Administer 84 mg ( 3 of the 28 mg) intranasally as directed twice a week 3 each 3   fexofenadine (ALLEGRA) 180 MG tablet Take 180 mg by mouth daily.     fluvoxaMINE (LUVOX) 100 MG tablet Take 3 tablets (300 mg total) by mouth daily. 90 tablet 0   hydrocortisone (ANUSOL-HC) 2.5 %  rectal cream Place rectally 2 (two) times daily. x 7-14 days 30 g 0   ketotifen (ZADITOR) 0.025 % ophthalmic solution Place 3 drops into both eyes at bedtime.     LORazepam (ATIVAN) 1 MG tablet Take 2 tablets (2 mg total) by mouth 2 (two) times daily. 120 tablet 2   LORazepam (ATIVAN) 1 MG tablet Take 2 tablets (2 mg total) by mouth 2 (two) times daily. 20 tablet 0   magnesium gluconate (MAGONATE) 500 MG tablet Take 500 mg by mouth daily.     MIBELAS 24 FE 1-20 MG-MCG(24) CHEW Chew 1 tablet by mouth at bedtime as needed (bowel regularity).     Multiple Vitamins-Minerals (ADULT GUMMY PO) Take 2 tablets by mouth in the morning.     nitrofurantoin (MACRODANTIN) 100 MG capsule Take 100 mg by mouth as needed (For urinary tract infection.).      ondansetron (ZOFRAN ODT) 4 MG disintegrating tablet Take 1 tablet (4 mg total) by mouth every 8 (eight) hours as needed for nausea or vomiting. 20 tablet 0   oxyCODONE-acetaminophen (PERCOCET/ROXICET) 5-325 MG tablet Take 1-2 tablets by mouth every 6 (six) hours as needed for severe pain. 50 tablet 0   polyethylene glycol (MIRALAX / GLYCOLAX) packet Take 17 g by mouth daily as needed for mild constipation. 14 each 0   psyllium  (METAMUCIL) 58.6 % powder Take 1 packet by mouth daily as needed (constipation).     temazepam (RESTORIL) 30 MG capsule Take 1 capsule (30 mg total) by mouth at bedtime as needed for sleep. 30 capsule 1   Vitamin D-Vitamin K (VITAMIN K2-VITAMIN D3 PO) Take 1-2 sprays by mouth daily.     No current facility-administered medications for this visit.    Medication Side Effects: None   Allergies:  Allergies  Allergen Reactions   Hydrocodone Itching   Sulfamethoxazole-Trimethoprim Itching   Dust Mite Extract Other (See Comments)    Sneezing, watery eyes, runny nose   Latex Itching   Other Other (See Comments)    PT IS ALLERGIC TO CAT DANDER AND RAGWEED - Sneezing, watery eyes, runny nose    Pollen Extract Other (See Comments)    Sneezing, watery eyes, runny nose     Past Medical History:  Diagnosis Date   Abnormal Pap smear 2011   hpv/mild dysplasia,cin1   Anxiety    Cerebral palsy (HCC)    right arm/leg   Cystocele    Depression    Headache    Neuromuscular disorder (HCC)    Cerebral Palsy   OCD (obsessive compulsive disorder)    Osteoporosis    Uterine prolaps     Family History  Problem Relation Age of Onset   Cancer Father        skin AND LUNG   Alcohol abuse Sister        CRACK COCAINE    Social History   Socioeconomic History   Marital status: Married    Spouse name: Not on file   Number of children: Not on file   Years of education: Not on file   Highest education level: Not on file  Occupational History   Not on file  Tobacco Use   Smoking status: Never   Smokeless tobacco: Never  Substance and Sexual Activity   Alcohol use: Not Currently    Comment: OCCASIONAL beer   Drug use: No   Sexual activity: Yes    Birth control/protection: Pill    Comment: LOESTRIN 24 FE  Other Topics Concern  Not on file  Social History Narrative   Not on file   Social Determinants of Health   Financial Resource Strain: Not on file  Food Insecurity: Not on  file  Transportation Needs: Not on file  Physical Activity: Not on file  Stress: Not on file  Social Connections: Not on file  Intimate Partner Violence: Not on file    Past Medical History, Surgical history, Social history, and Family history were reviewed and updated as appropriate.   Please see review of systems for further details on the patient's review from today.   Objective:   Physical Exam:  LMP  (LMP Unknown)   Physical Exam Neurological:     Mental Status: She is alert and oriented to person, place, and time.     Cranial Nerves: No dysarthria.     Motor: Weakness present.     Gait: Gait abnormal.  Psychiatric:        Attention and Perception: Attention and perception normal.        Mood and Affect: Mood is anxious and depressed.        Speech: Speech is rapid and pressured.        Behavior: Behavior is slowed and withdrawn. Behavior is cooperative.        Thought Content: Thought content normal. Thought content is not paranoid or delusional. Thought content does not include homicidal or suicidal ideation. Thought content does not include suicidal plan.        Cognition and Memory: Cognition and memory normal.        Judgment: Judgment normal.     Comments: Insight intact Ongoing OCD remains fairly severe Chronic depression    Lab Review:     Component Value Date/Time   NA 138 06/11/2021 0606   K 4.0 06/11/2021 0606   CL 107 06/11/2021 0606   CO2 26 06/11/2021 0606   GLUCOSE 90 06/11/2021 0606   BUN 18 06/11/2021 0606   CREATININE 0.81 06/11/2021 0606   CALCIUM 9.4 06/11/2021 0606   PROT 6.5 06/11/2021 0606   ALBUMIN 3.3 (L) 06/11/2021 0606   AST 17 06/11/2021 0606   ALT 14 06/11/2021 0606   ALKPHOS 141 (H) 06/11/2021 0606   BILITOT 0.2 (L) 06/11/2021 0606   GFRNONAA >60 06/11/2021 0606   GFRAA >60 07/09/2016 0438       Component Value Date/Time   WBC 5.8 06/11/2021 0606   RBC 4.12 06/11/2021 0606   HGB 12.5 06/11/2021 0606   HCT 39.7 06/11/2021  0606   PLT 299 06/11/2021 0606   MCV 96.4 06/11/2021 0606   MCH 30.3 06/11/2021 0606   MCHC 31.5 06/11/2021 0606   RDW 13.9 06/11/2021 0606   LYMPHSABS 1.9 06/11/2021 0606   MONOABS 0.5 06/11/2021 0606   EOSABS 0.1 06/11/2021 0606   BASOSABS 0.0 06/11/2021 0606    No results found for: POCLITH, LITHIUM   No results found for: PHENYTOIN, PHENOBARB, VALPROATE, CBMZ   .res Assessment: Plan:    Avarose was seen today for follow-up, depression and anxiety.  Diagnoses and all orders for this visit:  Recurrent major depression resistant to treatment [facility])  Mixed obsessional thoughts and acts  Social anxiety disorder  Insomnia due to mental condition   Options for TR sx.  Both Dx are TR and marked.  Impaired.  Her depression seems worse.  Perhaps some previous failed meds could be tried in high dosages.  Answered questions about ketamine and TMS in detail.  Ketamine not very available at  this time but might help both.  TMS more available but no TMS coil for OCD in [facility].  We discussed side effects of ketamine and TMS.  Disc alternative for TR sx including Trintellix, Vraylar. Disc using the unusual combo of Trintellix and lower dose Fluvoxamine.   continue Luvox back to 400 mg daily.  She thinks she was worse on less. Continue Wellbutrin XL to 300 mg daily.   Disc Spravato DT TRD incl details and SE. Disc dosing and duration.  Pt with severe depression MADRS 36 on9/13/22 Patient was administered Spravato 56 mg intranasally today.  The patient experienced the typical dissociation which gradually resolved over the 2-hour period of observation.  There were no complications.  Specifically the patient did not have nausea or vomiting or headache.  Blood pressures remained within normal ranges at the 40-minute and 2-hour follow-up intervals.  By the time the 2-hour observation period was met the patient was alert and oriented and able to exit without assistance.  Patient feels the Spravato  administration is helpful for the treatment resistant depression and would like to continue the treatment.  See nursing note for further details. Per protocol will increase Spravato to 84 mg next session.  Will plan twice weekly administrationfor a month.  Disc effects of perimenopause on sx and risks of estrogen.    Ativan and luvox could contribute to the fatigue but these appear necessary.   We discussed the short-term risks associated with benzodiazepines including sedation and increased fall risk among others.  Discussed long-term side effect risk including dependence, potential withdrawal symptoms, and the potential eventual dose-related risk of dementia.  But recent studies from 2020 dispute this association between benzodiazepines and dementia risk. Newer studies in 2020 do not support an association with dementia.   Follow-up twice weekly   Lynder Parents, MD, DFAPA  Please see After Visit Summary for patient specific instructions.  Future Appointments  Date Time Provider [place]  09/30/2021  3:00 PM Cottle, Billey Co., MD CP-CP None  09/30/2021  3:00 PM CP-NURSE CP-CP None  10/04/2021  3:00 PM Cottle, Billey Co., MD CP-CP None  10/04/2021  3:00 PM CP-NURSE CP-CP None  10/07/2021  3:00 PM Cottle, Billey Co., MD CP-CP None  10/07/2021  3:00 PM CP-NURSE CP-CP None  10/13/2021 11:00 AM Blanchie Serve, PhD CP-CP None  11/09/2021  1:30 PM Cottle, Billey Co., MD CP-CP None    No orders of the defined types were placed in this encounter.   -------------------------------

## 2021-09-29 ENCOUNTER — Ambulatory Visit: Payer: 59 | Admitting: Psychiatry

## 2021-09-30 ENCOUNTER — Ambulatory Visit: Payer: 59

## 2021-09-30 ENCOUNTER — Encounter: Payer: Self-pay | Admitting: Psychiatry

## 2021-09-30 ENCOUNTER — Ambulatory Visit (INDEPENDENT_AMBULATORY_CARE_PROVIDER_SITE_OTHER): Payer: 59 | Admitting: Psychiatry

## 2021-09-30 ENCOUNTER — Other Ambulatory Visit: Payer: Self-pay

## 2021-09-30 VITALS — BP 104/80 | HR 88

## 2021-09-30 DIAGNOSIS — F422 Mixed obsessional thoughts and acts: Secondary | ICD-10-CM

## 2021-09-30 DIAGNOSIS — F5105 Insomnia due to other mental disorder: Secondary | ICD-10-CM

## 2021-09-30 DIAGNOSIS — F4010 Social phobia, unspecified: Secondary | ICD-10-CM | POA: Diagnosis not present

## 2021-09-30 DIAGNOSIS — F339 Major depressive disorder, recurrent, unspecified: Secondary | ICD-10-CM

## 2021-09-30 DIAGNOSIS — F331 Major depressive disorder, recurrent, moderate: Secondary | ICD-10-CM

## 2021-09-30 NOTE — Progress Notes (Signed)
Casey Diaz [id number] 07/01/1966 55 y.o.    Subjective:   Patient ID:  Casey Diaz is a 55 y.o. (DOB [birth year]) female.  Chief Complaint:  Chief Complaint  Patient presents with   Follow-up   Depression   Anxiety     HPI Casey Diaz presents to the office today for follow-up of OCD and severe anxiety.     December 2019 visit the following was noted: No meds were changed. Lives in [place] and back for followup.  Sx are about the same.  Has to take meds with different sizes. Pt reports that mood is Anxious and Depressed and describes anxiety as Severe. Anxiety symptoms include: Excessive Worry, Obsessive Compulsive Symptoms:   Checking,,. Pt reports has interrupted sleep and nocturia. Pt reports that appetite is good. Pt reports that energy is no change and down slightly. Concentration is down slightly. Suicidal thoughts:  denied by patient. Loves the environment of [place] but misses some things there.  She's not able to work there.  H works there and likes it.  Struggled with not working, feels isolated and not up to task of meeting people.  Does attend a church and met a friend who's been helpful.  Leaving for [place] on 10/16/18.   04/09/2020 appointment the following is noted:  Staying another year in [place] bc Covid and other things. Last few months a lot of crying spells.  Is in menopause. Wonders about med changes though is nervous about it.  Crying spells associated with depressing thoughts more than stress or OCD.   Covid really hard on everyone and couldn't see family for 18 mos.  Family still very dysfunctional. No close friends in part due to OCD and depression. Son high Autism spectrum with ADHD and anxiety and she's with him all the time. Greater health problems with CP so more pains.   05/15/20 appt with the following noted: Casey Diaz for menopause and helps some. Still depressed.  Chronically. In [place] for 2 more weeks then to [place] for another  year. A lot of stressors lately triggering more checking and anxiety.   OCD is her CC now and seems.  Got worse DT stress.   Stressed with Asberger's son and her health.  H works a lot.  Her FOO still stress. Plan: Trintellix 10 mg 1 tablet in the morning with food and reduce fluvoxamine to 5 tablets nightly for 1 week  then reduce it to 4 tablets nightly.   07/02/20 appt with the following noted: Decided not to get Trintellix bc difficulty getting it. It is available.  There.  Wants to start it now.   Both depression and OCD are severe.  Not suicidal in intent or plan. Did not take samples with her to [place] but will be back in December. covid is worse there and travel is difficult.  Wants to reduce Wellbutrin DT dry mouth. Plan: She's afraid to reduce Luvox at this time DT fear of worsening OCD.  But will consider. Trintellix 10 mg 1 tablet in the morning with food and reduce fluvoxamine to 5 tablets nightly for 1 week  then reduce it to 4 tablets nightly. Also reduce Wellbutrin XL to 300 mg daily.    9-13 2022 appointment with the following noted: Back in [place] since July 14.  Broke arm a month ago and surgery.  It's all been rough adjustment.   B has cancer on his face and M fell taking him to the doctor.  Misses the water  and weather of [place].   Cry a lot more since menopause. Still depression and anxiety and OCD.  Asks about ketamine. On Wellbutrin 300, Luvox 300.  No Trintellix. Added Ativan 2 mg AM and HS and it helps.  More likely to get upset at night. Plan: Increase Luvox back to 400 mg daily.  She thinks she's worse on less. Continue Wellbutrin XL to 300 mg daily. 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  She tolerated it well without unusual nausea or vomiting headache or other somatic symptoms.  She did have the expected dissociation which gradually resolved over the course  of the 2-hour period of observation.  She was a little concerned about her balance given her cerebral palsy but has not noted unusual or unexpected problems.  She is motivated to can continue Spravato in hopes of reducing her depressive symptoms. 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  She also has treatment resistant OCD which is partially managed with medications but is still quite disabling.  She is tolerating the medications well.  She is sleeping adequately.  Her appetite is adequate.  She is not having suicidal thoughts.  She continues to wish for a better treatment for OCD that would give her some relief.  09/30/2021 appointment with the following noted: She received her first dose of Spravato 84 mg intranasally today.  She tolerated it well without unusual nausea, vomiting, or other somatic symptoms.  Dissociation as expected did occur and gradually resolved over the 2-hour period of observation.  She did have a mild headache today with the treatment and received ibuprofen 600 mg at her request.  We will follow this to see if it is a pattern  Patient is still depressed.  She said she was late with her medicine today and today was a particularly depressing day.  However she notes that the Spravato has lifted her mood considerably even today.  She is hopeful that it will continue to be helpful.  No suicidal thoughts.  She has ongoing chronic anxiety and OCD at baseline.  Previous psych med trials include Prozac, paroxetine, sertraline, fluvoxamine, venlafaxine, Anafranil with no response,  Wellbutrin, , Viibryd, Trintellix 10 1 month NR Geodon,  risperidone, Rexulti, Abilify,  Seroquel, Latuda 40 mg with irritability. lamotrigine lithium, BuSpar, Namenda,  pramipexole with no response, and Topamax, pindolol  ECT-MADRS    [place] Office Visit from 06/29/2021 in [facility] Total Score 36      Flowsheet Row Admission  (Discharged) from 06/11/2021 in [facility] No Risk        Review of Systems:  Review of Systems  HENT:  Negative for dental problem.   Cardiovascular:  Negative for palpitations.  Musculoskeletal:  Positive for arthralgias, back pain, gait problem and joint swelling.  Neurological:  Positive for weakness and headaches. Negative for tremors.   Medications: I have reviewed the patient's current medications.  Current Outpatient Medications  Medication Sig Dispense Refill   Abaloparatide (TYMLOS) 3120 MCG/1.56ML SOPN Inject into the skin.     Azelastine-Fluticasone 137-50 MCG/ACT SUSP Place 1-2 sprays into both nostrils daily.     baclofen (LIORESAL) 10 MG tablet Take 20 mg by mouth at bedtime as needed for muscle spasms.     buPROPion (WELLBUTRIN XL) 300 MG 24 hr tablet Take 1 tablet BY MOUTH  EVERY MORNING 30 tablet 1   dicyclomine (BENTYL) 10 MG capsule Take 10 mg by mouth daily.     docusate sodium (COLACE) 100 MG capsule Take 1 capsule (100 mg total) by mouth 2 (two) times daily. (Patient taking differently: Take 100 mg by mouth daily.) 10 capsule 0   Esketamine HCl, 84 MG Dose, (SPRAVATO, 84 MG DOSE,) 28 MG/DEVICE SOPK Administer 84 mg ( 3 of the 28 mg) intranasally as directed twice a week 3 each 3   fexofenadine (ALLEGRA) 180 MG tablet Take 180 mg by mouth daily.     fluvoxaMINE (LUVOX) 100 MG tablet Take 3 tablets (300 mg total) by mouth daily. 90 tablet 0   hydrocortisone (ANUSOL-HC) 2.5 % rectal cream Place rectally 2 (two) times daily. x 7-14 days 30 g 0   ketotifen (ZADITOR) 0.025 % ophthalmic solution Place 3 drops into both eyes at bedtime.     LORazepam (ATIVAN) 1 MG tablet Take 2 tablets (2 mg total) by mouth 2 (two) times daily. 120 tablet 2   magnesium gluconate (MAGONATE) 500 MG tablet Take 500 mg by mouth daily.     MIBELAS 24 FE 1-20 MG-MCG(24) CHEW Chew 1 tablet by mouth at bedtime as needed (bowel regularity).     Multiple  Vitamins-Minerals (ADULT GUMMY PO) Take 2 tablets by mouth in the morning.     nitrofurantoin (MACRODANTIN) 100 MG capsule Take 100 mg by mouth as needed (For urinary tract infection.).      polyethylene glycol (MIRALAX / GLYCOLAX) packet Take 17 g by mouth daily as needed for mild constipation. 14 each 0   psyllium (METAMUCIL) 58.6 % powder Take 1 packet by mouth daily as needed (constipation).     temazepam (RESTORIL) 30 MG capsule Take 1 capsule (30 mg total) by mouth at bedtime as needed for sleep. 30 capsule 1   Vitamin D-Vitamin K (VITAMIN K2-VITAMIN D3 PO) Take 1-2 sprays by mouth daily.     Esketamine HCl, 56 MG Dose, (SPRAVATO, 56 MG DOSE,) 28 MG/DEVICE SOPK Administer 56 mg ( 2 of the 28 mg) intranasally as directed (Patient not taking: Reported on 09/30/2021) 2 each 0   oxyCODONE-acetaminophen (PERCOCET/ROXICET) 5-325 MG tablet Take 1-2 tablets by mouth every 6 (six) hours as needed for severe pain. (Patient not taking: Reported on 09/30/2021) 50 tablet 0   No current facility-administered medications for this visit.    Medication Side Effects: None   Allergies:  Allergies  Allergen Reactions   Hydrocodone Itching   Sulfamethoxazole-Trimethoprim Itching   Dust Mite Extract Other (See Comments)    Sneezing, watery eyes, runny nose   Latex Itching   Other Other (See Comments)    PT IS ALLERGIC TO CAT DANDER AND RAGWEED - Sneezing, watery eyes, runny nose    Pollen Extract Other (See Comments)    Sneezing, watery eyes, runny nose     Past Medical History:  Diagnosis Date   Abnormal Pap smear 2011   hpv/mild dysplasia,cin1   Anxiety    Cerebral palsy (HCC)    right arm/leg   Cystocele    Depression    Headache    Neuromuscular disorder (HCC)    Cerebral Palsy   OCD (obsessive compulsive disorder)    Osteoporosis    Uterine prolaps     Family History  Problem Relation Age of Onset   Cancer Father        skin AND LUNG   Alcohol abuse Sister  CRACK  COCAINE    Social History   Socioeconomic History   Marital status: Married    Spouse name: Not on file   Number of children: Not on file   Years of education: Not on file   Highest education level: Not on file  Occupational History   Not on file  Tobacco Use   Smoking status: Never   Smokeless tobacco: Never  Substance and Sexual Activity   Alcohol use: Not Currently    Comment: OCCASIONAL beer   Drug use: No   Sexual activity: Yes    Birth control/protection: Pill    Comment: LOESTRIN 24 FE  Other Topics Concern   Not on file  Social History Narrative   Not on file   Social Determinants of Health   Financial Resource Strain: Not on file  Food Insecurity: Not on file  Transportation Needs: Not on file  Physical Activity: Not on file  Stress: Not on file  Social Connections: Not on file  Intimate Partner Violence: Not on file    Past Medical History, Surgical history, Social history, and Family history were reviewed and updated as appropriate.   Please see review of systems for further details on the patient's review from today.   Objective:   Physical Exam:  LMP  (LMP Unknown)   Physical Exam Neurological:     Mental Status: She is alert and oriented to person, place, and time.     Cranial Nerves: No dysarthria.     Motor: Weakness present.     Gait: Gait abnormal.  Psychiatric:        Attention and Perception: Attention and perception normal.        Mood and Affect: Mood is anxious and depressed.        Speech: Speech is rapid and pressured.        Behavior: Behavior is slowed and withdrawn. Behavior is cooperative.        Thought Content: Thought content normal. Thought content is not paranoid or delusional. Thought content does not include homicidal or suicidal ideation. Thought content does not include suicidal plan.        Cognition and Memory: Cognition and memory normal.        Judgment: Judgment normal.     Comments: Insight intact Ongoing OCD  remains fairly severe Chronic depression persistent    Lab Review:     Component Value Date/Time   NA 138 06/11/2021 0606   K 4.0 06/11/2021 0606   CL 107 06/11/2021 0606   CO2 26 06/11/2021 0606   GLUCOSE 90 06/11/2021 0606   BUN 18 06/11/2021 0606   CREATININE 0.81 06/11/2021 0606   CALCIUM 9.4 06/11/2021 0606   PROT 6.5 06/11/2021 0606   ALBUMIN 3.3 (L) 06/11/2021 0606   AST 17 06/11/2021 0606   ALT 14 06/11/2021 0606   ALKPHOS 141 (H) 06/11/2021 0606   BILITOT 0.2 (L) 06/11/2021 0606   GFRNONAA >60 06/11/2021 0606   GFRAA >60 07/09/2016 0438       Component Value Date/Time   WBC 5.8 06/11/2021 0606   RBC 4.12 06/11/2021 0606   HGB 12.5 06/11/2021 0606   HCT 39.7 06/11/2021 0606   PLT 299 06/11/2021 0606   MCV 96.4 06/11/2021 0606   MCH 30.3 06/11/2021 0606   MCHC 31.5 06/11/2021 0606   RDW 13.9 06/11/2021 0606   LYMPHSABS 1.9 06/11/2021 0606   MONOABS 0.5 06/11/2021 0606   EOSABS 0.1 06/11/2021 0606   BASOSABS 0.0 06/11/2021  0606    No results found for: POCLITH, LITHIUM   No results found for: PHENYTOIN, PHENOBARB, VALPROATE, CBMZ   .res Assessment: Plan:    Casey Diaz was seen today for follow-up, depression and anxiety.  Diagnoses and all orders for this visit:  Recurrent major depression resistant to treatment [facility])  Mixed obsessional thoughts and acts  Social anxiety disorder  Insomnia due to mental condition   Options for TR sx.  Both Dx are TR and marked.  Impaired.  Her depression seems worse.  Perhaps some previous failed meds could be tried in high dosages.  Answered questions about ketamine and TMS in detail.  Ketamine not very available at this time but might help both.  TMS more available but no TMS coil for OCD in [place].  We discussed side effects of ketamine and TMS.  Disc alternative for TR sx including Trintellix, Vraylar. Disc using the unusual combo of Trintellix and lower dose Fluvoxamine.   continue Luvox back to 400 mg daily.  She  thinks she was worse on less. Continue Wellbutrin XL to 300 mg daily.   Disc Spravato DT TRD incl details and SE. Disc dosing and duration.  Pt with severe depression MADRS 36 on9/13/22 Patient was administered Spravato 84 mg intranasally today.  The patient experienced the typical dissociation which gradually resolved over the 2-hour period of observation.  There were no complications.  Specifically the patient did not have nausea or vomiting or headache.  Blood pressures remained within normal ranges at the 40-minute and 2-hour follow-up intervals.  By the time the 2-hour observation period was met the patient was alert and oriented and able to exit without assistance.  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  See nursing note for further details. Per protocol will continue Spravato to 84 mg next session.  Will plan twice weekly administrationfor a month.  Disc effects of perimenopause on sx and risks of estrogen.    Ativan and luvox could contribute to the fatigue but these appear necessary.   We discussed the short-term risks associated with benzodiazepines including sedation and increased fall risk among others.  Discussed long-term side effect risk including dependence, potential withdrawal symptoms, and the potential eventual dose-related risk of dementia.  But recent studies from 2020 dispute this association between benzodiazepines and dementia risk. Newer studies in 2020 do not support an association with dementia.   Follow-up twice weekly   Casey Parents, MD, DFAPA  Please see After Visit Summary for patient specific instructions.  Future Appointments  Date Time Provider [place]  10/04/2021  3:00 PM Cottle, Billey Co., MD CP-CP None  10/04/2021  3:00 PM CP-NURSE [facility] None  10/07/2021  3:00 PM Cottle, Billey Co., MD CP-CP None  10/07/2021  3:00 PM CP-NURSE CP-CP None   10/13/2021 11:00 AM Blanchie Serve, PhD CP-CP None  10/14/2021  9:15 AM Wallene Huh, DPM [facility]  11/09/2021  1:30 PM Cottle, Billey Co., MD CP-CP None    No orders of the defined types were placed in this encounter.   -------------------------------

## 2021-10-03 NOTE — Progress Notes (Signed)
Nurse visit:   Patient arrived for her 1st Spravato treatment, she was directed to the [facility] Treatment Room. Pt is being treated for Treatment Resistant Depression, her starting dose will be 56 mg (2 of the 28 mg) nasal sprays. Patient arrived and taken to treatment room, explained and discussed things about her treatment and how the schedule should go, along with any side effects that may occur.Let patient use the trainer nasal sprays from Spravato for her to get the feel of it and how it clicks. She verbalized understanding. Contacted pt a day ahead to confirm and answer any questions she may have had. Confirmed she had a ride home. Pt's Spravato is with [facility] and delivered to office, all Spravato medication is stored at doctors office per REMS/FDA guidelines. Required to be locked behind two doors.    Began taking patient's vital signs at 3:20 pm 120/76, pulse 100. Instructed patient to blow her nose then recline back to a 45 degree angle. Gave patient first dose 28 mg nasal spray, patient did well, administered in each nostril as directed and waited 5 more minutes for her second dose, which she did the same. After both doses given pt did not complain of any nausea/vomiting, she did drink something because of the taste the nasal sprays give you. Patient brought music to listen to but didn't have ear pods, informed her that was okay because it was just me monitoring her. Pt asked that the lights be turned off completely and close the door.  Patient doing well, she was singing along to her music.  Checked 40 minute vitals at 3:55 AM, 118/72 pulse 86. Discharge vitals were taken at 4:19 AM 109/65, pulse 86. Nursing staff normally walks the patient down for the first few visits, to make sure they are safely put in their riders car and that she made it down safely. Recommend she go home and sleep or just relax on the couch. No driving, no intense activities. She agreed. Pt will be receiving 2  treatments per week for 4 weeks as recommended. Then reassessed to go down to 1 treatment weekly. Nurse was with pt a total of 55 minutes for clinical assessment. Pt is scheduled on Friday, December 15th. Pt instructed to call office with any problems or questions.    EXP 2024-FEB LOT [id number]

## 2021-10-04 ENCOUNTER — Encounter: Payer: Self-pay | Admitting: Psychiatry

## 2021-10-04 ENCOUNTER — Ambulatory Visit (INDEPENDENT_AMBULATORY_CARE_PROVIDER_SITE_OTHER): Payer: 59 | Admitting: Psychiatry

## 2021-10-04 ENCOUNTER — Other Ambulatory Visit: Payer: Self-pay

## 2021-10-04 ENCOUNTER — Ambulatory Visit: Payer: 59

## 2021-10-04 VITALS — BP 100/60 | HR 70

## 2021-10-04 DIAGNOSIS — F422 Mixed obsessional thoughts and acts: Secondary | ICD-10-CM | POA: Diagnosis not present

## 2021-10-04 DIAGNOSIS — F5105 Insomnia due to other mental disorder: Secondary | ICD-10-CM | POA: Diagnosis not present

## 2021-10-04 DIAGNOSIS — F4010 Social phobia, unspecified: Secondary | ICD-10-CM

## 2021-10-04 DIAGNOSIS — G809 Cerebral palsy, unspecified: Secondary | ICD-10-CM

## 2021-10-04 DIAGNOSIS — F339 Major depressive disorder, recurrent, unspecified: Secondary | ICD-10-CM | POA: Diagnosis not present

## 2021-10-04 NOTE — Progress Notes (Signed)
Casey Diaz [id number] 02-02-1966 55 y.o.    Subjective:   Patient ID:  Casey Diaz is a 55 y.o. (DOB February 12, 1966) female.  Chief Complaint:  Chief Complaint  Patient presents with   Follow-up   Depression   Anxiety   Stress    family     HPI Casey Diaz presents to the office today for follow-up of OCD and severe anxiety.     December 2019 visit the following was noted: No meds were changed. Lives in [place] and back for followup.  Sx are about the same.  Has to take meds with different sizes. Pt reports that mood is Anxious and Depressed and describes anxiety as Severe. Anxiety symptoms include: Excessive Worry, Obsessive Compulsive Symptoms:   Checking,,. Pt reports has interrupted sleep and nocturia. Pt reports that appetite is good. Pt reports that energy is no change and down slightly. Concentration is down slightly. Suicidal thoughts:  denied by patient. Loves the environment of [place] but misses some things there.  She's not able to work there.  H works there and likes it.  Struggled with not working, feels isolated and not up to task of meeting people.  Does attend a church and met a friend who's been helpful.  Leaving for [place] on 10/16/18.   04/09/2020 appointment the following is noted:  Staying another year in [place] bc Covid and other things. Last few months a lot of crying spells.  Is in menopause. Wonders about med changes though is nervous about it.  Crying spells associated with depressing thoughts more than stress or OCD.   Covid really hard on everyone and couldn't see family for 18 mos.  Family still very dysfunctional. No close friends in part due to OCD and depression. Son high Autism spectrum with ADHD and anxiety and she's with him all the time. Greater health problems with CP so more pains.   05/15/20 appt with the following noted: Casey Diaz for menopause and helps some. Still depressed.  Chronically. In [place] for 2 more weeks then to  [place] for another year. A lot of stressors lately triggering more checking and anxiety.   OCD is her CC now and seems.  Got worse DT stress.   Stressed with Asberger's son and her health.  H works a lot.  Her FOO still stress. Plan: Trintellix 10 mg 1 tablet in the morning with food and reduce fluvoxamine to 5 tablets nightly for 1 week  then reduce it to 4 tablets nightly.   07/02/20 appt with the following noted: Decided not to get Trintellix bc difficulty getting it. It is available.  There.  Wants to start it now.   Both depression and OCD are severe.  Not suicidal in intent or plan. Did not take samples with her to [place] but will be back in December. covid is worse there and travel is difficult.  Wants to reduce Wellbutrin DT dry mouth. Plan: She's afraid to reduce Luvox at this time DT fear of worsening OCD.  But will consider. Trintellix 10 mg 1 tablet in the morning with food and reduce fluvoxamine to 5 tablets nightly for 1 week  then reduce it to 4 tablets nightly. Also reduce Wellbutrin XL to 300 mg daily.    9-13 2022 appointment with the following noted: Back in [place] since July 14.  Broke arm a month ago and surgery.  It's all been rough adjustment.   B has cancer on his face and M fell taking him  to the doctor.  Misses the water and weather of [place].   Cry a lot more since menopause. Still depression and anxiety and OCD.  Asks about ketamine. On Wellbutrin 300, Luvox 300.  No Trintellix. Added Ativan 2 mg AM and HS and it helps.  More likely to get upset at night. Plan: Increase Luvox back to 400 mg daily.  She thinks she's worse on less. Continue Wellbutrin XL to 300 mg daily. Plan to start Spravato for TRD asap   09/27/2021 appointment with the following noted:  She has started Spravato today at 53 mg intranasally.  She tolerated it well without unusual nausea or vomiting headache or other somatic symptoms.  She did have the expected dissociation which gradually  resolved over the course of the 2-hour period of observation.  She was a little concerned about her balance given her cerebral palsy but has not noted unusual or unexpected problems.  She is motivated to can continue Spravato in hopes of reducing her depressive symptoms. 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  She also has treatment resistant OCD which is partially managed with medications but is still quite disabling.  She is tolerating the medications well.  She is sleeping adequately.  Her appetite is adequate.  She is not having suicidal thoughts.  She continues to wish for a better treatment for OCD that would give her some relief.  09/30/2021 appointment with the following noted: She received her first dose of Spravato 53 mg intranasally today.  She tolerated it well without unusual nausea, vomiting, or other somatic symptoms.  Dissociation as expected did occur and gradually resolved over the 2-hour period of observation.  She did have a mild headache today with the treatment and received ibuprofen 600 mg at her request.  We will follow this to see if it is a pattern Patient is still depressed.  She said she was late with her medicine today and today was a particularly depressing day.  However she notes that the Spravato has lifted her mood considerably even today.  She is hopeful that it will continue to be helpful.  No suicidal thoughts.  She has ongoing chronic anxiety and OCD at baseline.  10/04/21 appt noted: Patient received Spravato 53 mg for the second time today.  She tolerated it well without any unusual headache, nausea or vomiting or other somatic symptoms.  Dissociation did occur and she gradually [place] resolution over the 2-hour period of observation.  She did not have any unusual problems after she left the office last Spravato administration.  She did not have any specific problems with balance or walking.  She is at increased risk of that  difficulty because of cerebral palsy.  So far she has not noticed much mood effect from the medication beyond the first day of receiving it.  However she would like to continue Spravato in hopes of getting the antidepressant effect that is desired. Stress dealing with mother's behavior at party pt hosted.  Guilt over it.  Previous psych med trials include Prozac, paroxetine, sertraline, fluvoxamine, venlafaxine, Anafranil with no response,  Wellbutrin, , Viibryd, Trintellix 10 1 month NR Geodon,  risperidone, Rexulti, Abilify,  Seroquel, Latuda 40 mg with irritability. lamotrigine lithium, BuSpar, Namenda,  pramipexole with no response, and Topamax, pindolol  ECT-MADRS    [employer] Office Visit from 06/29/2021 in [facility] Total Score 36      Flowsheet Row Admission (Discharged) from 06/11/2021 in [facility]  PERIOPERATIVE AREA  C-SSRS RISK CATEGORY No Risk        Review of Systems:  Review of Systems  HENT:  Negative for dental problem.   Cardiovascular:  Negative for chest pain and palpitations.  Musculoskeletal:  Positive for arthralgias, back pain, gait problem and joint swelling.  Neurological:  Positive for weakness and headaches. Negative for tremors.   Medications: I have reviewed the patient's current medications.  Current Outpatient Medications  Medication Sig Dispense Refill   Abaloparatide (TYMLOS) 3120 MCG/1.56ML SOPN Inject into the skin.     Azelastine-Fluticasone 137-50 MCG/ACT SUSP Place 1-2 sprays into both nostrils daily.     baclofen (LIORESAL) 10 MG tablet Take 20 mg by mouth at bedtime as needed for muscle spasms.     buPROPion (WELLBUTRIN XL) 300 MG 24 hr tablet Take 1 tablet BY MOUTH EVERY MORNING 30 tablet 1   dicyclomine (BENTYL) 10 MG capsule Take 10 mg by mouth daily.     docusate sodium (COLACE) 100 MG capsule Take 1 capsule (100 mg total) by mouth 2 (two) times daily. (Patient taking differently: Take 100 mg by mouth  daily.) 10 capsule 0   Esketamine HCl, 84 MG Dose, (SPRAVATO, 84 MG DOSE,) 28 MG/DEVICE SOPK Administer 84 mg ( 3 of the 28 mg) intranasally as directed twice a week 3 each 3   fexofenadine (ALLEGRA) 180 MG tablet Take 180 mg by mouth daily.     fluvoxaMINE (LUVOX) 100 MG tablet Take 3 tablets (300 mg total) by mouth daily. 90 tablet 0   hydrocortisone (ANUSOL-HC) 2.5 % rectal cream Place rectally 2 (two) times daily. x 7-14 days 30 g 0   ketotifen (ZADITOR) 0.025 % ophthalmic solution Place 3 drops into both eyes at bedtime.     LORazepam (ATIVAN) 1 MG tablet Take 2 tablets (2 mg total) by mouth 2 (two) times daily. 120 tablet 2   magnesium gluconate (MAGONATE) 500 MG tablet Take 500 mg by mouth daily.     MIBELAS 24 FE 1-20 MG-MCG(24) CHEW Chew 1 tablet by mouth at bedtime as needed (bowel regularity).     Multiple Vitamins-Minerals (ADULT GUMMY PO) Take 2 tablets by mouth in the morning.     nitrofurantoin (MACRODANTIN) 100 MG capsule Take 100 mg by mouth as needed (For urinary tract infection.).      oxyCODONE-acetaminophen (PERCOCET/ROXICET) 5-325 MG tablet Take 1-2 tablets by mouth every 6 (six) hours as needed for severe pain. 50 tablet 0   polyethylene glycol (MIRALAX / GLYCOLAX) packet Take 17 g by mouth daily as needed for mild constipation. 14 each 0   psyllium (METAMUCIL) 58.6 % powder Take 1 packet by mouth daily as needed (constipation).     temazepam (RESTORIL) 30 MG capsule Take 1 capsule (30 mg total) by mouth at bedtime as needed for sleep. 30 capsule 1   Vitamin D-Vitamin K (VITAMIN K2-VITAMIN D3 PO) Take 1-2 sprays by mouth daily.     No current facility-administered medications for this visit.    Medication Side Effects: None   Allergies:  Allergies  Allergen Reactions   Hydrocodone Itching   Sulfamethoxazole-Trimethoprim Itching   Dust Mite Extract Other (See Comments)    Sneezing, watery eyes, runny nose   Latex Itching   Other Other (See Comments)    PT IS  ALLERGIC TO CAT DANDER AND RAGWEED - Sneezing, watery eyes, runny nose    Pollen Extract Other (See Comments)    Sneezing, watery eyes, runny nose  Past Medical History:  Diagnosis Date   Abnormal Pap smear 2011   hpv/mild dysplasia,cin1   Anxiety    Cerebral palsy (HCC)    right arm/leg   Cystocele    Depression    Headache    Neuromuscular disorder (HCC)    Cerebral Palsy   OCD (obsessive compulsive disorder)    Osteoporosis    Uterine prolaps     Family History  Problem Relation Age of Onset   Cancer Father        skin AND LUNG   Alcohol abuse Sister        CRACK COCAINE    Social History   Socioeconomic History   Marital status: Married    Spouse name: Not on file   Number of children: Not on file   Years of education: Not on file   Highest education level: Not on file  Occupational History   Not on file  Tobacco Use   Smoking status: Never   Smokeless tobacco: Never  Substance and Sexual Activity   Alcohol use: Not Currently    Comment: OCCASIONAL beer   Drug use: No   Sexual activity: Yes    Birth control/protection: Pill    Comment: LOESTRIN 24 FE  Other Topics Concern   Not on file  Social History Narrative   Not on file   Social Determinants of Health   Financial Resource Strain: Not on file  Food Insecurity: Not on file  Transportation Needs: Not on file  Physical Activity: Not on file  Stress: Not on file  Social Connections: Not on file  Intimate Partner Violence: Not on file    Past Medical History, Surgical history, Social history, and Family history were reviewed and updated as appropriate.   Please see review of systems for further details on the patient's review from today.   Objective:   Physical Exam:  LMP  (LMP Unknown)   Physical Exam Neurological:     Mental Status: She is alert and oriented to person, place, and time.     Cranial Nerves: No dysarthria.     Motor: Weakness present.     Gait: Gait abnormal.   Psychiatric:        Attention and Perception: Attention and perception normal.        Mood and Affect: Mood is anxious and depressed.        Speech: Speech is not rapid and pressured or slurred.        Behavior: Behavior is not slowed or withdrawn. Behavior is cooperative.        Thought Content: Thought content normal. Thought content is not paranoid or delusional. Thought content does not include homicidal or suicidal ideation. Thought content does not include suicidal plan.        Cognition and Memory: Cognition and memory normal.        Judgment: Judgment normal.     Comments: Insight intact Ongoing OCD remains fairly severe Chronic depression persistent    Lab Review:     Component Value Date/Time   NA 138 06/11/2021 0606   K 4.0 06/11/2021 0606   CL 107 06/11/2021 0606   CO2 26 06/11/2021 0606   GLUCOSE 90 06/11/2021 0606   BUN 18 06/11/2021 0606   CREATININE 0.81 06/11/2021 0606   CALCIUM 9.4 06/11/2021 0606   PROT 6.5 06/11/2021 0606   ALBUMIN 3.3 (L) 06/11/2021 0606   AST 17 06/11/2021 0606   ALT 14 06/11/2021 0606   ALKPHOS 141 (  H) 06/11/2021 0606   BILITOT 0.2 (L) 06/11/2021 0606   GFRNONAA >60 06/11/2021 0606   GFRAA >60 07/09/2016 0438       Component Value Date/Time   WBC 5.8 06/11/2021 0606   RBC 4.12 06/11/2021 0606   HGB 12.5 06/11/2021 0606   HCT 39.7 06/11/2021 0606   PLT 299 06/11/2021 0606   MCV 96.4 06/11/2021 0606   MCH 30.3 06/11/2021 0606   MCHC 31.5 06/11/2021 0606   RDW 13.9 06/11/2021 0606   LYMPHSABS 1.9 06/11/2021 0606   MONOABS 0.5 06/11/2021 0606   EOSABS 0.1 06/11/2021 0606   BASOSABS 0.0 06/11/2021 0606    No results found for: POCLITH, LITHIUM   No results found for: PHENYTOIN, PHENOBARB, VALPROATE, CBMZ   .res Assessment: Plan:    Casey Diaz was seen today for follow-up, depression, anxiety and stress.  Diagnoses and all orders for this visit:  Recurrent major depression resistant to treatment [facility])  Mixed obsessional  thoughts and acts  Social anxiety disorder  Insomnia due to mental condition  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[place])    Options for TR sx.  Both Dx are TR and marked.  Impaired.  Her depression seems worse.  Perhaps some previous failed meds could be tried in high dosages.  Answered questions about ketamine and TMS in detail.  Ketamine not very available at this time but might help both.  TMS more available but no TMS coil for OCD in [place].  We discussed side effects of ketamine and TMS.  Disc alternative for TR sx including Trintellix, Vraylar. Disc using the unusual combo of Trintellix and lower dose Fluvoxamine.   continue Luvox back to 400 mg daily.  She thinks she was worse on less. Continue Wellbutrin XL to 300 mg daily.   Disc Spravato DT TRD incl details and SE. Disc dosing and duration.  Pt with severe depression MADRS 36 on9/13/22 Patient was administered Spravato 53 mg intranasally 2nd dosage today today.  The patient experienced the typical dissociation which gradually resolved over the 2-hour period of observation.  There were no complications.  Specifically the patient did not have nausea or vomiting or headache.  Blood pressures remained within normal ranges at the 40-minute and 2-hour follow-up intervals.  By the time the 2-hour observation period was met the patient was alert and oriented and able to exit without assistance.  .  See nursing note for further details. Per protocol will continue Spravato to 84 mg next session.  Will plan twice weekly administrationfor a month.  Disc effects of perimenopause on sx and risks of estrogen.    Ativan and luvox could contribute to the fatigue but these appear necessary.   We discussed the short-term risks associated with benzodiazepines including sedation and increased fall risk among others.  Discussed long-term side effect risk including dependence, potential withdrawal symptoms, and the potential eventual dose-related risk of dementia.  But  recent studies from 2020 dispute this association between benzodiazepines and dementia risk. Newer studies in 2020 do not support an association with dementia.  Supportive therapy dealing with stressful family interactions lately leading to guilt and tears.   Follow-up twice weekly   Lynder Parents, MD, DFAPA  Please see After Visit Summary for patient specific instructions.  Future Appointments  Date Time Provider [place]  10/07/2021  3:00 PM Cottle, Billey Co., MD [facility] None  10/07/2021  3:00 PM CP-NURSE [facility] None  10/13/2021 11:00 AM Blanchie Serve, PhD [facility] None  10/14/2021  9:15 AM Regal,  Tamala Fothergill, DPM [facility]  11/09/2021  1:30 PM Cottle, Billey Co., MD CP[facility] None    No orders of the defined types were placed in this encounter.    -------------------------------

## 2021-10-04 NOTE — Progress Notes (Signed)
NURSE Visit:   Pt arrived for her 2nd Spravato treatment, today she will get her 1st dose of the increase to 84 mg (3 of the 28 mg), which is the maintenance dose for her treatment resistant depression, she was directed to the treatment room to get vitals taken first. B/P at 3:05 PM 118/80, 90. Pt instructed to blow her nose and to recline back at 45 degrees. Pt had some difficulty sitting in the larger chair with handles due unable to use her left arm. Tried to assist her the best I could. Pt given first nasal spray (28 mg) administered by pt observed by nurse. There were 5 minutes between each dose, total of 84 mg. Tolerated well. Pt's medication is delivered by [facility] in [place] and stored inside a safe behind a locked door as well. Spravato is a CIII medication and has to be only given at a treatment facility and never given to patient.  Pt relaxes in recliner, lights dimmed and chatting with nurse. Pt's 40 minute vital signs at 3:40 PM 113/78 88. Pt is talkative, experiencing some dissociation but pleasant feeling she reports. [name] comes to discuss her care at the end of her treatment when her thoughts are clearer. Pt reports having a headache after her treatment, nurse gave her 3 of the 200 mg Ibuprofen at 5 pm for her headache. Discharge vitals at 5:05 PM 104/80, 88. Pt stable for discharge. Next week she is scheduled on Monday, December December 22th at 3:00 PM. Pt was observed on site a total of 120 minutes per FDA/REMS requirements. Pt was with nurse for clinical assessment a total of 55 minutes.      LOT [id number] EXP FEB 2024

## 2021-10-06 NOTE — Telephone Encounter (Signed)
Please send she's almost due

## 2021-10-07 ENCOUNTER — Encounter: Payer: Self-pay | Admitting: Psychiatry

## 2021-10-07 ENCOUNTER — Other Ambulatory Visit: Payer: Self-pay

## 2021-10-07 ENCOUNTER — Ambulatory Visit: Payer: 59

## 2021-10-07 ENCOUNTER — Ambulatory Visit (INDEPENDENT_AMBULATORY_CARE_PROVIDER_SITE_OTHER): Payer: 59 | Admitting: Psychiatry

## 2021-10-07 VITALS — BP 114/68 | HR 80

## 2021-10-07 DIAGNOSIS — F339 Major depressive disorder, recurrent, unspecified: Secondary | ICD-10-CM

## 2021-10-07 DIAGNOSIS — F4010 Social phobia, unspecified: Secondary | ICD-10-CM

## 2021-10-07 DIAGNOSIS — F5105 Insomnia due to other mental disorder: Secondary | ICD-10-CM | POA: Diagnosis not present

## 2021-10-07 DIAGNOSIS — F422 Mixed obsessional thoughts and acts: Secondary | ICD-10-CM

## 2021-10-07 NOTE — Progress Notes (Signed)
Casey Diaz [id number] 07/01/1966 55 y.o.    Subjective:   Patient ID:  Casey Diaz is a 55 y.o. (DOB [birth year]) female.  Chief Complaint:  Chief Complaint  Patient presents with   Follow-up   Depression   Anxiety     HPI [name] presents to the office today for follow-up of OCD and severe anxiety.     December 2019 visit the following was noted: No meds were changed. Lives in [place] and back for followup.  Sx are about the same.  Has to take meds with different sizes. Pt reports that mood is Anxious and Depressed and describes anxiety as Severe. Anxiety symptoms include: Excessive Worry, Obsessive Compulsive Symptoms:   Checking,,. Pt reports has interrupted sleep and nocturia. Pt reports that appetite is good. Pt reports that energy is no change and down slightly. Concentration is down slightly. Suicidal thoughts:  denied by patient. Loves the environment of [place] but misses some things there.  She's not able to work there.  H works there and likes it.  Struggled with not working, feels isolated and not up to task of meeting people.  Does attend a church and met a friend who's been helpful.  Leaving for [place] on 10/16/18.   04/09/2020 appointment the following is noted:  Staying another year in [place] bc Covid and other things. Last few months a lot of crying spells.  Is in menopause. Wonders about med changes though is nervous about it.  Crying spells associated with depressing thoughts more than stress or OCD.   Covid really hard on everyone and couldn't see family for 18 mos.  Family still very dysfunctional. No close friends in part due to OCD and depression. Son high Autism spectrum with ADHD and anxiety and she's with him all the time. Greater health problems with CP so more pains.   05/15/20 appt with the following noted: [name] for menopause and helps some. Still depressed.  Chronically. In [place] for 2 more weeks then to [place] for another  year. A lot of stressors lately triggering more checking and anxiety.   OCD is her CC now and seems.  Got worse DT stress.   Stressed with Asberger's son and her health.  H works a lot.  Her FOO still stress. Plan: Trintellix 10 mg 1 tablet in the morning with food and reduce fluvoxamine to 5 tablets nightly for 1 week  then reduce it to 4 tablets nightly.   07/02/20 appt with the following noted: Decided not to get Trintellix bc difficulty getting it. It is available.  There.  Wants to start it now.   Both depression and OCD are severe.  Not suicidal in intent or plan. Did not take samples with her to [place] but will be back in December. covid is worse there and travel is difficult.  Wants to reduce Wellbutrin DT dry mouth. Plan: She's afraid to reduce Luvox at this time DT fear of worsening OCD.  But will consider. Trintellix 10 mg 1 tablet in the morning with food and reduce fluvoxamine to 5 tablets nightly for 1 week  then reduce it to 4 tablets nightly. Also reduce Wellbutrin XL to 300 mg daily.    9-13 2022 appointment with the following noted: Back in [place] since July 14.  Broke arm a month ago and surgery.  It's all been rough adjustment.   B has cancer on his face and M fell taking him to the doctor.  Misses the water  and weather of [place].   Cry a lot more since menopause. Still depression and anxiety and OCD.  Asks about ketamine. On Wellbutrin 300, Luvox 300.  No Trintellix. Added Ativan 2 mg AM and HS and it helps.  More likely to get upset at night. Plan: Increase Luvox back to 400 mg daily.  She thinks she's worse on less. Continue Wellbutrin XL to 300 mg daily. 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  She tolerated it well without unusual nausea or vomiting headache or other somatic symptoms.  She did have the expected dissociation which gradually resolved over the course  of the 2-hour period of observation.  She was a little concerned about her balance given her cerebral palsy but has not noted unusual or unexpected problems.  She is motivated to can continue Spravato in hopes of reducing her depressive symptoms. 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  She also has treatment resistant OCD which is partially managed with medications but is still quite disabling.  She is tolerating the medications well.  She is sleeping adequately.  Her appetite is adequate.  She is not having suicidal thoughts.  She continues to wish for a better treatment for OCD that would give her some relief.  09/30/2021 appointment with the following noted: She received her first dose of Spravato 84 mg intranasally today.  She tolerated it well without unusual nausea, vomiting, or other somatic symptoms.  Dissociation as expected did occur and gradually resolved over the 2-hour period of observation.  She did have a mild headache today with the treatment and received ibuprofen 600 mg at her request.  We will follow this to see if it is a pattern Patient is still depressed.  She said she was late with her medicine today and today was a particularly depressing day.  However she notes that the Spravato has lifted her mood considerably even today.  She is hopeful that it will continue to be helpful.  No suicidal thoughts.  She has ongoing chronic anxiety and OCD at baseline.  10/04/21 appt noted: Patient received Spravato 84 mg for the second time today.  She tolerated it well without any unusual headache, nausea or vomiting or other somatic symptoms.  Dissociation did occur and she gradually [place] resolution over the 2-hour period of observation. She did not have any unusual problems after she left the office last Spravato administration.  She did not have any specific problems with balance or walking.  She is at increased risk of that difficulty because of cerebral  palsy.  So far she has not noticed much mood effect from the medication beyond the first day of receiving it.  However she would like to continue Spravato in hopes of getting the antidepressant effect that is desired. Stress dealing with mother's behavior at party pt hosted.  Guilt over it.  10/07/2021 appointment noted: Patient received Spravato 84 mg for the second time today.  She tolerated it well without any unusual headache, nausea or vomiting or other somatic symptoms.  Dissociation did occur and she gradually [place] resolution over the 2-hour period of observation.  She still is not sure about the antidepressant effect of Spravato.  Events over the holidays and demands, make it difficult to assess.  She still notes that the OCD tends to worsen the depression and vice versa.  She tolerates the Spravato well  and wants to continue the trial.  Previous psych med trials include Prozac, paroxetine, sertraline, fluvoxamine, venlafaxine, Anafranil with no response,  Wellbutrin, , Viibryd, Trintellix 10 1 month NR Geodon,  risperidone, Rexulti, Abilify,  Seroquel, Latuda 40 mg with irritability. lamotrigine lithium, BuSpar, Namenda,  pramipexole with no response, and Topamax, pindolol  ECT-MADRS    [place] Office Visit from 06/29/2021 in [facility] Total Score 36      Flowsheet Row Admission (Discharged) from 06/11/2021 in [facility] No Risk        Review of Systems:  Review of Systems  HENT:  Negative for dental problem.   Cardiovascular:  Negative for chest pain and palpitations.  Musculoskeletal:  Positive for arthralgias, back pain, gait problem and joint swelling.  Neurological:  Positive for weakness and headaches. Negative for tremors.   Medications: I have reviewed the patient's current medications.  Current Outpatient Medications  Medication Sig Dispense Refill   Abaloparatide (TYMLOS) 3120 MCG/1.56ML  SOPN Inject into the skin.     Azelastine-Fluticasone 137-50 MCG/ACT SUSP Place 1-2 sprays into both nostrils daily.     baclofen (LIORESAL) 10 MG tablet Take 20 mg by mouth at bedtime as needed for muscle spasms.     buPROPion (WELLBUTRIN XL) 300 MG 24 hr tablet Take 1 tablet BY MOUTH EVERY MORNING 30 tablet 1   dicyclomine (BENTYL) 10 MG capsule Take 10 mg by mouth daily.     docusate sodium (COLACE) 100 MG capsule Take 1 capsule (100 mg total) by mouth 2 (two) times daily. (Patient taking differently: Take 100 mg by mouth daily.) 10 capsule 0   Esketamine HCl, 84 MG Dose, (SPRAVATO, 84 MG DOSE,) 28 MG/DEVICE SOPK USE NASALLY AS DIRECTED ON PACKAGE LABEL TWICE A WEEK 3 each 3   fexofenadine (ALLEGRA) 180 MG tablet Take 180 mg by mouth daily.     fluvoxaMINE (LUVOX) 100 MG tablet Take 3 tablets (300 mg total) by mouth daily. 90 tablet 0   hydrocortisone (ANUSOL-HC) 2.5 % rectal cream Place rectally 2 (two) times daily. x 7-14 days 30 g 0   ketotifen (ZADITOR) 0.025 % ophthalmic solution Place 3 drops into both eyes at bedtime.     LORazepam (ATIVAN) 1 MG tablet Take 2 tablets (2 mg total) by mouth 2 (two) times daily. 120 tablet 2   magnesium gluconate (MAGONATE) 500 MG tablet Take 500 mg by mouth daily.     MIBELAS 24 FE 1-20 MG-MCG(24) CHEW Chew 1 tablet by mouth at bedtime as needed (bowel regularity).     Multiple Vitamins-Minerals (ADULT GUMMY PO) Take 2 tablets by mouth in the morning.     nitrofurantoin (MACRODANTIN) 100 MG capsule Take 100 mg by mouth as needed (For urinary tract infection.).      oxyCODONE-acetaminophen (PERCOCET/ROXICET) 5-325 MG tablet Take 1-2 tablets by mouth every 6 (six) hours as needed for severe pain. 50 tablet 0   polyethylene glycol (MIRALAX / GLYCOLAX) packet Take 17 g by mouth daily as needed for mild constipation. 14 each 0   psyllium (METAMUCIL) 58.6 % powder Take 1 packet by mouth daily as needed (constipation).     temazepam (RESTORIL) 30 MG capsule Take  1 capsule (30 mg total) by mouth at bedtime as needed for sleep. 30 capsule 1   Vitamin D-Vitamin K (VITAMIN K2-VITAMIN D3 PO) Take 1-2 sprays by mouth daily.     No current facility-administered medications for this visit.  Medication Side Effects: None   Allergies:  Allergies  Allergen Reactions   Hydrocodone Itching   Sulfamethoxazole-Trimethoprim Itching   Dust Mite Extract Other (See Comments)    Sneezing, watery eyes, runny nose   Latex Itching   Other Other (See Comments)    PT IS ALLERGIC TO CAT DANDER AND RAGWEED - Sneezing, watery eyes, runny nose    Pollen Extract Other (See Comments)    Sneezing, watery eyes, runny nose     Past Medical History:  Diagnosis Date   Abnormal Pap smear 2011   hpv/mild dysplasia,cin1   Anxiety    Cerebral palsy (HCC)    right arm/leg   Cystocele    Depression    Headache    Neuromuscular disorder (HCC)    Cerebral Palsy   OCD (obsessive compulsive disorder)    Osteoporosis    Uterine prolaps     Family History  Problem Relation Age of Onset   Cancer Father        skin AND LUNG   Alcohol abuse Sister        CRACK COCAINE    Social History   Socioeconomic History   Marital status: Married    Spouse name: Not on file   Number of children: Not on file   Years of education: Not on file   Highest education level: Not on file  Occupational History   Not on file  Tobacco Use   Smoking status: Never   Smokeless tobacco: Never  Substance and Sexual Activity   Alcohol use: Not Currently    Comment: OCCASIONAL beer   Drug use: No   Sexual activity: Yes    Birth control/protection: Pill    Comment: LOESTRIN 24 FE  Other Topics Concern   Not on file  Social History Narrative   Not on file   Social Determinants of Health   Financial Resource Strain: Not on file  Food Insecurity: Not on file  Transportation Needs: Not on file  Physical Activity: Not on file  Stress: Not on file  Social Connections: Not on file   Intimate Partner Violence: Not on file    Past Medical History, Surgical history, Social history, and Family history were reviewed and updated as appropriate.   Please see review of systems for further details on the patient's review from today.   Objective:   Physical Exam:  LMP  (LMP Unknown)   Physical Exam Constitutional:      General: She is not in acute distress. Musculoskeletal:        General: No deformity.  Neurological:     Mental Status: She is alert and oriented to person, place, and time.     Cranial Nerves: No dysarthria.     Motor: Weakness present.     Coordination: Coordination normal.     Gait: Gait abnormal.  Psychiatric:        Attention and Perception: Attention and perception normal. She does not perceive auditory or visual hallucinations.        Mood and Affect: Mood is anxious and depressed. Affect is not labile, blunt or inappropriate.        Speech: Speech normal. Speech is not rapid and pressured or slurred.        Behavior: Behavior normal. Behavior is not slowed or withdrawn. Behavior is cooperative.        Thought Content: Thought content normal. Thought content is not delusional. Thought content does not include homicidal or suicidal ideation. Thought content does  not include suicidal plan.        Cognition and Memory: Cognition and memory normal.        Judgment: Judgment normal.     Comments: Insight intact Ongoing OCD remains fairly severe Chronic depression persistent    Lab Review:     Component Value Date/Time   NA 138 06/11/2021 0606   K 4.0 06/11/2021 0606   CL 107 06/11/2021 0606   CO2 26 06/11/2021 0606   GLUCOSE 90 06/11/2021 0606   BUN 18 06/11/2021 0606   CREATININE 0.81 06/11/2021 0606   CALCIUM 9.4 06/11/2021 0606   PROT 6.5 06/11/2021 0606   ALBUMIN 3.3 (L) 06/11/2021 0606   AST 17 06/11/2021 0606   ALT 14 06/11/2021 0606   ALKPHOS 141 (H) 06/11/2021 0606   BILITOT 0.2 (L) 06/11/2021 0606   GFRNONAA >60 06/11/2021  0606   GFRAA >60 07/09/2016 0438       Component Value Date/Time   WBC 5.8 06/11/2021 0606   RBC 4.12 06/11/2021 0606   HGB 12.5 06/11/2021 0606   HCT 39.7 06/11/2021 0606   PLT 299 06/11/2021 0606   MCV 96.4 06/11/2021 0606   MCH 30.3 06/11/2021 0606   MCHC 31.5 06/11/2021 0606   RDW 13.9 06/11/2021 0606   LYMPHSABS 1.9 06/11/2021 0606   MONOABS 0.5 06/11/2021 0606   EOSABS 0.1 06/11/2021 0606   BASOSABS 0.0 06/11/2021 0606    No results found for: POCLITH, LITHIUM   No results found for: PHENYTOIN, PHENOBARB, VALPROATE, CBMZ   .res Assessment: Plan:    Arionna was seen today for follow-up, depression and anxiety.  Diagnoses and all orders for this visit:  Recurrent major depression resistant to treatment [facility])  Mixed obsessional thoughts and acts  Social anxiety disorder  Insomnia due to mental condition    Options for TR sx.  Both Dx are TR and marked.  Impaired.  Her depression seems worse.  Perhaps some previous failed meds could be tried in high dosages.  Answered questions about ketamine and TMS in detail.  Ketamine not very available at this time but might help both.  TMS more available but no TMS coil for OCD in [place].  We discussed side effects of ketamine and TMS.  Disc alternative for TR sx including Trintellix, Vraylar. Disc using the unusual combo of Trintellix and lower dose Fluvoxamine.   continue Luvox back to 400 mg daily.  She thinks she was worse on less. Continue Wellbutrin XL to 300 mg daily.   Disc Spravato DT TRD incl details and SE. Disc dosing and duration.  Pt with severe depression MADRS 36 on9/13/22 Patient was administered Spravato 84 mg intranasally dosage today today.  The patient experienced the typical dissociation which gradually resolved over the 2-hour period of observation.  There were no complications.  Specifically the patient did not have nausea or vomiting or headache.  Blood pressures remained within normal ranges at the 40-minute  and 2-hour follow-up intervals.  By the time the 2-hour observation period was met the patient was alert and oriented and able to exit without assistance.  .  See nursing note for further details. Per protocol will continue Spravato to 84 mg next session.  Will plan twice weekly administrationfor a month.  Disc options in light of shortened week related to Christmas and New Year.  Disc effects of perimenopause on sx and risks of estrogen.    Ativan and luvox could contribute to the fatigue but these appear necessary.  We discussed the short-term risks associated with benzodiazepines including sedation and increased fall risk among others.  Discussed long-term side effect risk including dependence, potential withdrawal symptoms, and the potential eventual dose-related risk of dementia.  But recent studies from 2020 dispute this association between benzodiazepines and dementia risk. Newer studies in 2020 do not support an association with dementia.  Supportive therapy dealing with stressful family interactions lately leading to guilt and tears.   Follow-up twice weekly   Lynder Parents, MD, DFAPA  Please see After Visit Summary for patient specific instructions.  Future Appointments  Date Time Provider [place]  10/25/2021 11:15 AM Wallene Huh, DPM [facility]  11/09/2021  1:30 PM Cottle, Billey Co., MD [facility] None  11/22/2021 11:00 AM Blanchie Serve, PhD [facility] None  12/06/2021 11:00 AM Blanchie Serve, PhD [facility] None  12/20/2021 11:00 AM Blanchie Serve, PhD [facility] None  01/03/2022 11:00 AM Blanchie Serve, PhD [facility] None    No orders of the defined types were placed in this encounter.    -------------------------------

## 2021-10-07 NOTE — Progress Notes (Signed)
NURSE Visit:   Pt arrived for her 3rd Spravato treatment, today she will get 84 mg (3 of the 28 mg), which is the maintenance dose for her treatment resistant depression, she was directed to the treatment room to get vitals taken first. B/P at 3:10 PM 100/58, 68. Pt instructed to blow her nose and to recline back at 45 degrees. Pt given first nasal spray (28 mg) administered by pt observed by nurse. There were 5 minutes between each dose, total of 84 mg. Tolerated well. Pt's medication is delivered by [facility] in [place] and stored inside a safe behind a locked door as well. Spravato is a CIII medication and has to be only given at a treatment facility and never given to patient.  Pt relaxes in recliner, lights dimmed and chatting with nurse. Pt's 40 minute vital signs at 3:50 PM 110/62 70. Pt is talkative, experiencing some dissociation but pleasant feeling she reports. [name] comes to discuss her care at the end of her treatment when her thoughts are clearer. No complaints of a headache today after her treatment. Discharge vitals at 5:10 PM 100/60, 70. Pt stable for discharge. Next treatment is Thursday, December 22nd at 3:00 PM. Pt was observed on site a total of 120 minutes per FDA/REMS requirements. Pt was with nurse for clinical assessment a total of 55 minutes.      LOT [id number] EXP FEB 2024

## 2021-10-11 NOTE — Progress Notes (Signed)
NURSE Visit:   Pt arrived for her 4th Spravato treatment, today she will get 84 mg (3 of the 28 mg), which is the maintenance dose for her treatment resistant depression, she was directed to the treatment room to get vitals taken first. B/P at 3:10 PM 112/72, 92. Pt instructed to blow her nose and to recline back at 45 degrees. Pt given first nasal spray (28 mg) administered by pt observed by nurse. There were 5 minutes between each dose, total of 84 mg. Tolerated well. Pt's medication is delivered by [facility] in [place] and stored inside a safe behind a locked door as well. Spravato is a CIII medication and has to be only given at a treatment facility and never given to patient.  Pt relaxes in recliner, lights dimmed and chatting with nurse. Pt's 40 minute vital signs at 3:50 PM 102/68 80. Pt is talkative, experiencing some dissociation but pleasant feeling she reports. [name] comes to discuss her care at the end of her treatment when her thoughts are clearer. No complaints of a headache today after her treatment. Discharge vitals at 5:10 PM 114/68, 80. Pt stable for discharge. Next treatment is Wednesday, December 28th at 3:00 PM. Pt was observed on site a total of 120 minutes per FDA/REMS requirements. Pt was with nurse for clinical assessment a total of 55 minutes.   LOT [id number] EXP [id number]

## 2021-10-13 ENCOUNTER — Ambulatory Visit: Payer: 59 | Admitting: Psychiatry

## 2021-10-13 ENCOUNTER — Other Ambulatory Visit: Payer: Self-pay

## 2021-10-13 ENCOUNTER — Ambulatory Visit (INDEPENDENT_AMBULATORY_CARE_PROVIDER_SITE_OTHER): Payer: 59 | Admitting: Psychiatry

## 2021-10-13 DIAGNOSIS — Z638 Other specified problems related to primary support group: Secondary | ICD-10-CM

## 2021-10-13 DIAGNOSIS — F339 Major depressive disorder, recurrent, unspecified: Secondary | ICD-10-CM | POA: Diagnosis not present

## 2021-10-13 DIAGNOSIS — F422 Mixed obsessional thoughts and acts: Secondary | ICD-10-CM | POA: Diagnosis not present

## 2021-10-13 DIAGNOSIS — F4010 Social phobia, unspecified: Secondary | ICD-10-CM | POA: Diagnosis not present

## 2021-10-13 DIAGNOSIS — G809 Cerebral palsy, unspecified: Secondary | ICD-10-CM
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
## 2021-10-14 ENCOUNTER — Ambulatory Visit: Payer: Self-pay | Admitting: Podiatry

## 2021-10-15 ENCOUNTER — Other Ambulatory Visit: Payer: Self-pay

## 2021-10-15 ENCOUNTER — Encounter: Payer: Self-pay | Admitting: Psychiatry

## 2021-10-15 ENCOUNTER — Ambulatory Visit (INDEPENDENT_AMBULATORY_CARE_PROVIDER_SITE_OTHER): Payer: 59 | Admitting: Psychiatry

## 2021-10-15 ENCOUNTER — Ambulatory Visit: Payer: 59

## 2021-10-15 VITALS — BP 119/79 | HR 91

## 2021-10-15 DIAGNOSIS — F4010 Social phobia, unspecified: Secondary | ICD-10-CM | POA: Diagnosis not present

## 2021-10-15 DIAGNOSIS — G809 Cerebral palsy, unspecified: Secondary | ICD-10-CM

## 2021-10-15 DIAGNOSIS — F339 Major depressive disorder, recurrent, unspecified: Secondary | ICD-10-CM

## 2021-10-15 DIAGNOSIS — F5105 Insomnia due to other mental disorder: Secondary | ICD-10-CM

## 2021-10-15 DIAGNOSIS — F422 Mixed obsessional thoughts and acts: Secondary | ICD-10-CM | POA: Diagnosis not present

## 2021-10-15 MED ORDER — SPRAVATO (84 MG DOSE) 28 MG/DEVICE NA SOPK: PACK | NASAL | 3 refills | Status: DC

## 2021-10-15 NOTE — Progress Notes (Signed)
Casey Diaz [id number] Nov 24, 1965 55 y.o.    Subjective:   Patient ID:  Casey Diaz is a 55 y.o. (DOB [birth year]) female.  Chief Complaint:  Chief Complaint  Patient presents with   Follow-up   Depression   Anxiety     HPI Casey Diaz presents to the office today for follow-up of OCD and severe anxiety.     December 2019 visit the following was noted: No meds were changed. Lives in [place] and back for followup.  Sx are about the same.  Has to take meds with different sizes. Pt reports that mood is Anxious and Depressed and describes anxiety as Severe. Anxiety symptoms include: Excessive Worry, Obsessive Compulsive Symptoms:   Checking,,. Pt reports has interrupted sleep and nocturia. Pt reports that appetite is good. Pt reports that energy is no change and down slightly. Concentration is down slightly. Suicidal thoughts:  denied by patient. Loves the environment of [place] but misses some things there.  She's not able to work there.  H works there and likes it.  Struggled with not working, feels isolated and not up to task of meeting people.  Does attend a church and met a friend who's been helpful.  Leaving for [place] on 10/16/18.   04/09/2020 appointment the following is noted:  Staying another year in [place] bc Covid and other things. Last few months a lot of crying spells.  Is in menopause. Wonders about med changes though is nervous about it.  Crying spells associated with depressing thoughts more than stress or OCD.   Covid really hard on everyone and couldn't see family for 18 mos.  Family still very dysfunctional. No close friends in part due to OCD and depression. Son high Autism spectrum with ADHD and anxiety and she's with him all the time. Greater health problems with CP so more pains.   05/15/20 appt with the following noted: [name] for menopause and helps some. Still depressed.  Chronically. In [place] for 2 more weeks then to [place] for another  year. A lot of stressors lately triggering more checking and anxiety.   OCD is her CC now and seems.  Got worse DT stress.   Stressed with Asberger's son and her health.  H works a lot.  Her FOO still stress. Plan: Trintellix 10 mg 1 tablet in the morning with food and reduce fluvoxamine to 5 tablets nightly for 1 week  then reduce it to 4 tablets nightly.   07/02/20 appt with the following noted: Decided not to get Trintellix bc difficulty getting it. It is available.  There.  Wants to start it now.   Both depression and OCD are severe.  Not suicidal in intent or plan. Did not take samples with her to [place] but will be back in December. covid is worse there and travel is difficult.  Wants to reduce Wellbutrin DT dry mouth. Plan: She's afraid to reduce Luvox at this time DT fear of worsening OCD.  But will consider. Trintellix 10 mg 1 tablet in the morning with food and reduce fluvoxamine to 5 tablets nightly for 1 week  then reduce it to 4 tablets nightly. Also reduce Wellbutrin XL to 300 mg daily.    9-13 2022 appointment with the following noted: Back in [place] since July 14.  Broke arm a month ago and surgery.  It's all been rough adjustment.   B has cancer on his face and M fell taking him to the doctor.  Misses the water  and weather of [place].   Cry a lot more since menopause. Still depression and anxiety and OCD.  Asks about ketamine. On Wellbutrin 300, Luvox 300.  No Trintellix. Added Ativan 2 mg AM and HS and it helps.  More likely to get upset at night. Plan: Increase Luvox back to 400 mg daily.  She thinks she's worse on less. Continue Wellbutrin XL to 300 mg daily. Plan to start Spravato for TRD asap   09/27/2021 appointment with the following noted:  She has started Spravato today at 53 mg intranasally.  She tolerated it well without unusual nausea or vomiting headache or other somatic symptoms.  She did have the expected dissociation which gradually resolved over the course  of the 2-hour period of observation.  She was a little concerned about her balance given her cerebral palsy but has not noted unusual or unexpected problems.  She is motivated to can continue Spravato in hopes of reducing her depressive symptoms. 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  She also has treatment resistant OCD which is partially managed with medications but is still quite disabling.  She is tolerating the medications well.  She is sleeping adequately.  Her appetite is adequate.  She is not having suicidal thoughts.  She continues to wish for a better treatment for OCD that would give her some relief.  09/30/2021 appointment with the following noted: She received her first dose of Spravato 53 mg intranasally today.  She tolerated it well without unusual nausea, vomiting, or other somatic symptoms.  Dissociation as expected did occur and gradually resolved over the 2-hour period of observation.  She did have a mild headache today with the treatment and received ibuprofen 600 mg at her request.  We will follow this to see if it is a pattern Patient is still depressed.  She said she was late with her medicine today and today was a particularly depressing day.  However she notes that the Spravato has lifted her mood considerably even today.  She is hopeful that it will continue to be helpful.  No suicidal thoughts.  She has ongoing chronic anxiety and OCD at baseline.  10/04/21 appt noted: Patient received Spravato 53 mg for the second time today.  She tolerated it well without any unusual headache, nausea or vomiting or other somatic symptoms.  Dissociation did occur and she gradually [place] resolution over the 2-hour period of observation. She did not have any unusual problems after she left the office last Spravato administration.  She did not have any specific problems with balance or walking.  She is at increased risk of that difficulty because of cerebral  palsy.  So far she has not noticed much mood effect from the medication beyond the first day of receiving it.  However she would like to continue Spravato in hopes of getting the antidepressant effect that is desired. Stress dealing with mother's behavior at party pt hosted.  Guilt over it.  10/07/2021 appointment noted: Patient received Spravato 53 mg for the second time today.  She tolerated it well without any unusual headache, nausea or vomiting or other somatic symptoms.  Dissociation did occur and she gradually [place] resolution over the 2-hour period of observation. She still is not sure about the antidepressant effect of Spravato.  Events over the holidays and demands, make it difficult to assess.  She still notes that the OCD tends to worsen the depression and vice versa.  She tolerates the Spravato well and  wants to continue the trial.  10/15/2021 appointment with the following noted: Patient received Spravato 53 mg for the second time today.  She tolerated it well without any unusual headache, nausea or vomiting or other somatic symptoms.  Dissociation did occur and she gradually [place] resolution over the 2-hour period of observation.  Patient says it was somewhat difficult to evaluate the effect of the Spravato.  It was scheduled to be twice weekly for 4 weeks consecutively but the holidays have interfered with that administration.  She asked what specifically should be she should be looking for in order to assess improvement.  That was discussed.  The OCD is unchanged and the depression so far is not significantly different.  She still tolerates meds.  There have been no recent med changes  Previous psych med trials include Prozac, paroxetine, sertraline, fluvoxamine, venlafaxine, Anafranil with no response,  Wellbutrin, , Viibryd, Trintellix 10 1 month NR Geodon,  risperidone, Rexulti, Abilify,  Seroquel, Latuda 40 mg with irritability. lamotrigine lithium, BuSpar, Namenda,  pramipexole with  no response, and Topamax, pindolol  ECT-MADRS    [place] Office Visit from 06/29/2021 in [place] Total Score 36      Flowsheet Row Admission (Discharged) from 06/11/2021 in [place] No Risk        Review of Systems:  Review of Systems  HENT:  Negative for dental problem.   Cardiovascular:  Negative for chest pain and palpitations.  Gastrointestinal:  Negative for nausea.  Musculoskeletal:  Positive for arthralgias, back pain, gait problem and joint swelling.  Neurological:  Positive for weakness and headaches. Negative for tremors.   Medications: I have reviewed the patient's current medications.  Current Outpatient Medications  Medication Sig Dispense Refill   Abaloparatide (TYMLOS) 3120 MCG/1.56ML SOPN Inject into the skin.     Azelastine-Fluticasone 137-50 MCG/ACT SUSP Place 1-2 sprays into both nostrils daily.     baclofen (LIORESAL) 10 MG tablet Take 20 mg by mouth at bedtime as needed for muscle spasms.     buPROPion (WELLBUTRIN XL) 300 MG 24 hr tablet Take 1 tablet BY MOUTH EVERY MORNING 30 tablet 1   dicyclomine (BENTYL) 10 MG capsule Take 10 mg by mouth daily.     docusate sodium (COLACE) 100 MG capsule Take 1 capsule (100 mg total) by mouth 2 (two) times daily. (Patient taking differently: Take 100 mg by mouth daily.) 10 capsule 0   Esketamine HCl, 84 MG Dose, (SPRAVATO, 84 MG DOSE,) 28 MG/DEVICE SOPK USE NASALLY AS DIRECTED ON PACKAGE LABEL TWICE A WEEK 3 each 3   fexofenadine (ALLEGRA) 180 MG tablet Take 180 mg by mouth daily.     fluvoxaMINE (LUVOX) 100 MG tablet Take 3 tablets (300 mg total) by mouth daily. 90 tablet 0   hydrocortisone (ANUSOL-HC) 2.5 % rectal cream Place rectally 2 (two) times daily. x 7-14 days 30 g 0   ketotifen (ZADITOR) 0.025 % ophthalmic solution Place 3 drops into both eyes at bedtime.     LORazepam (ATIVAN) 1 MG tablet Take 2 tablets (2 mg total) by mouth 2 (two)  times daily. 120 tablet 2   magnesium gluconate (MAGONATE) 500 MG tablet Take 500 mg by mouth daily.     MIBELAS 24 FE 1-20 MG-MCG(24) CHEW Chew 1 tablet by mouth at bedtime as needed (bowel regularity).     Multiple Vitamins-Minerals (ADULT GUMMY PO) Take 2 tablets by mouth in the morning.  nitrofurantoin (MACRODANTIN) 100 MG capsule Take 100 mg by mouth as needed (For urinary tract infection.).      oxyCODONE-acetaminophen (PERCOCET/ROXICET) 5-325 MG tablet Take 1-2 tablets by mouth every 6 (six) hours as needed for severe pain. 50 tablet 0   polyethylene glycol (MIRALAX / GLYCOLAX) packet Take 17 g by mouth daily as needed for mild constipation. 14 each 0   psyllium (METAMUCIL) 58.6 % powder Take 1 packet by mouth daily as needed (constipation).     temazepam (RESTORIL) 30 MG capsule Take 1 capsule (30 mg total) by mouth at bedtime as needed for sleep. 30 capsule 1   Vitamin D-Vitamin K (VITAMIN K2-VITAMIN D3 PO) Take 1-2 sprays by mouth daily.     No current facility-administered medications for this visit.    Medication Side Effects: None   Allergies:  Allergies  Allergen Reactions   Hydrocodone Itching   Sulfamethoxazole-Trimethoprim Itching   Dust Mite Extract Other (See Comments)    Sneezing, watery eyes, runny nose   Latex Itching   Other Other (See Comments)    PT IS ALLERGIC TO CAT DANDER AND RAGWEED - Sneezing, watery eyes, runny nose    Pollen Extract Other (See Comments)    Sneezing, watery eyes, runny nose     Past Medical History:  Diagnosis Date   Abnormal Pap smear 2011   hpv/mild dysplasia,cin1   Anxiety    Cerebral palsy (HCC)    right arm/leg   Cystocele    Depression    Headache    Neuromuscular disorder (HCC)    Cerebral Palsy   OCD (obsessive compulsive disorder)    Osteoporosis    Uterine prolaps     Family History  Problem Relation Age of Onset   Cancer Father        skin AND LUNG   Alcohol abuse Sister        CRACK COCAINE     Social History   Socioeconomic History   Marital status: Married    Spouse name: Not on file   Number of children: Not on file   Years of education: Not on file   Highest education level: Not on file  Occupational History   Not on file  Tobacco Use   Smoking status: Never   Smokeless tobacco: Never  Substance and Sexual Activity   Alcohol use: Not Currently    Comment: OCCASIONAL beer   Drug use: No   Sexual activity: Yes    Birth control/protection: Pill    Comment: LOESTRIN 24 FE  Other Topics Concern   Not on file  Social History Narrative   Not on file   Social Determinants of Health   Financial Resource Strain: Not on file  Food Insecurity: Not on file  Transportation Needs: Not on file  Physical Activity: Not on file  Stress: Not on file  Social Connections: Not on file  Intimate Partner Violence: Not on file    Past Medical History, Surgical history, Social history, and Family history were reviewed and updated as appropriate.   Please see review of systems for further details on the patient's review from today.   Objective:   Physical Exam:  LMP  (LMP Unknown)   Physical Exam Constitutional:      General: She is not in acute distress. Musculoskeletal:        General: No deformity.  Neurological:     Mental Status: She is alert and oriented to person, place, and time.     Cranial  Nerves: No dysarthria.     Motor: Weakness present.     Coordination: Coordination normal.     Gait: Gait abnormal.  Psychiatric:        Attention and Perception: Attention and perception normal. She does not perceive auditory or visual hallucinations.        Mood and Affect: Mood is anxious and depressed. Affect is not labile or blunt.        Speech: Speech normal. Speech is not rapid and pressured or slurred.        Behavior: Behavior normal. Behavior is not slowed or withdrawn. Behavior is cooperative.        Thought Content: Thought content normal. Thought content  is not delusional. Thought content does not include homicidal or suicidal ideation. Thought content does not include suicidal plan.        Cognition and Memory: Cognition and memory normal.        Judgment: Judgment normal.     Comments: Insight intact Ongoing OCD remains fairly severe Chronic depression persistent    Lab Review:     Component Value Date/Time   NA 138 06/11/2021 0606   K 4.0 06/11/2021 0606   CL 107 06/11/2021 0606   CO2 26 06/11/2021 0606   GLUCOSE 90 06/11/2021 0606   BUN 18 06/11/2021 0606   CREATININE 0.81 06/11/2021 0606   CALCIUM 9.4 06/11/2021 0606   PROT 6.5 06/11/2021 0606   ALBUMIN 3.3 (L) 06/11/2021 0606   AST 17 06/11/2021 0606   ALT 14 06/11/2021 0606   ALKPHOS 141 (H) 06/11/2021 0606   BILITOT 0.2 (L) 06/11/2021 0606   GFRNONAA >60 06/11/2021 0606   GFRAA >60 07/09/2016 0438       Component Value Date/Time   WBC 5.8 06/11/2021 0606   RBC 4.12 06/11/2021 0606   HGB 12.5 06/11/2021 0606   HCT 39.7 06/11/2021 0606   PLT 299 06/11/2021 0606   MCV 96.4 06/11/2021 0606   MCH 30.3 06/11/2021 0606   MCHC 31.5 06/11/2021 0606   RDW 13.9 06/11/2021 0606   LYMPHSABS 1.9 06/11/2021 0606   MONOABS 0.5 06/11/2021 0606   EOSABS 0.1 06/11/2021 0606   BASOSABS 0.0 06/11/2021 0606    No results found for: POCLITH, LITHIUM   No results found for: PHENYTOIN, PHENOBARB, VALPROATE, CBMZ   .res Assessment: Plan:    Casey Diaz was seen today for follow-up, depression and anxiety.  Diagnoses and all orders for this visit:  Recurrent major depression resistant to treatment [facility])  Mixed obsessional thoughts and acts  Social anxiety disorder  Insomnia due to mental condition  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[place])    Options for TR sx.  Both Dx are TR and marked.  Impaired.  Her depression seems worse.  Perhaps some previous failed meds could be tried in high dosages.  Answered questions about ketamine and TMS in detail.  Ketamine not very available at this  time but might help both.  TMS more available but no TMS coil for OCD in [place].  We discussed side effects of ketamine and TMS.  Disc alternative for TR sx including Trintellix, Vraylar. Disc using the unusual combo of Trintellix and lower dose Fluvoxamine.   continue Luvox back to 400 mg daily.  She thinks she was worse on less. Continue Wellbutrin XL to 300 mg daily.   Disc Spravato DT TRD incl details and SE. Disc dosing and duration.  Pt with severe depression MADRS 36 on9/13/22 Patient was administered Spravato 84 mg intranasally dosage  today today.  The patient experienced the typical dissociation which gradually resolved over the 2-hour period of observation.  There were no complications.  Specifically the patient did not have nausea or vomiting or headache.  Blood pressures remained within normal ranges at the 40-minute and 2-hour follow-up intervals.  By the time the 2-hour observation period was met the patient was alert and oriented and able to exit without assistance.  .  See nursing note for further details. Per protocol will continue Spravato to 84 mg next session.  Will plan twice weekly administrationfor a month.  Disc options in light of shortened week related to Christmas and New Year.  Disc effects of perimenopause on sx and risks of estrogen.    Ativan and luvox could contribute to the fatigue but these appear necessary.   We discussed the short-term risks associated with benzodiazepines including sedation and increased fall risk among others.  Discussed long-term side effect risk including dependence, potential withdrawal symptoms, and the potential eventual dose-related risk of dementia.  But recent studies from 2020 dispute this association between benzodiazepines and dementia risk. Newer studies in 2020 do not support an association with dementia.  Supportive therapy dealing with stressful family interactions lately leading to guilt and tears.  No med changes today.    Follow-up twice weekly   Lynder Parents, MD, DFAPA  Please see After Visit Summary for patient specific instructions.  Future Appointments  Date Time Provider [place]  10/25/2021 11:15 AM Wallene Huh, DPM [facility]  11/09/2021  1:30 PM Cottle, Billey Co., MD [facility] None  11/22/2021 11:00 AM Blanchie Serve, PhD [facility] None  12/06/2021 11:00 AM Blanchie Serve, PhD [facility] None  12/20/2021 11:00 AM Blanchie Serve, PhD [facility] None  01/03/2022 11:00 AM Blanchie Serve, PhD [facility] None    No orders of the defined types were placed in this encounter.    -------------------------------

## 2021-10-19 ENCOUNTER — Ambulatory Visit: Payer: 59

## 2021-10-19 ENCOUNTER — Ambulatory Visit (INDEPENDENT_AMBULATORY_CARE_PROVIDER_SITE_OTHER): Payer: 59 | Admitting: Psychiatry

## 2021-10-19 ENCOUNTER — Other Ambulatory Visit: Payer: Self-pay

## 2021-10-19 VITALS — BP 121/84 | HR 86

## 2021-10-19 DIAGNOSIS — F339 Major depressive disorder, recurrent, unspecified: Secondary | ICD-10-CM

## 2021-10-19 DIAGNOSIS — F4010 Social phobia, unspecified: Secondary | ICD-10-CM | POA: Diagnosis not present

## 2021-10-19 DIAGNOSIS — F422 Mixed obsessional thoughts and acts: Secondary | ICD-10-CM

## 2021-10-19 DIAGNOSIS — F5105 Insomnia due to other mental disorder: Secondary | ICD-10-CM

## 2021-10-21 ENCOUNTER — Ambulatory Visit (INDEPENDENT_AMBULATORY_CARE_PROVIDER_SITE_OTHER): Payer: 59 | Admitting: Psychiatry

## 2021-10-21 ENCOUNTER — Ambulatory Visit: Payer: 59

## 2021-10-21 ENCOUNTER — Other Ambulatory Visit: Payer: Self-pay

## 2021-10-21 VITALS — BP 138/77 | HR 95

## 2021-10-21 DIAGNOSIS — G809 Cerebral palsy, unspecified: Secondary | ICD-10-CM

## 2021-10-21 DIAGNOSIS — F5105 Insomnia due to other mental disorder: Secondary | ICD-10-CM

## 2021-10-21 DIAGNOSIS — F422 Mixed obsessional thoughts and acts: Secondary | ICD-10-CM | POA: Diagnosis not present

## 2021-10-21 DIAGNOSIS — F339 Major depressive disorder, recurrent, unspecified: Secondary | ICD-10-CM

## 2021-10-21 DIAGNOSIS — F4010 Social phobia, unspecified: Secondary | ICD-10-CM

## 2021-10-25 ENCOUNTER — Ambulatory Visit: Payer: 59

## 2021-10-25 ENCOUNTER — Ambulatory Visit (INDEPENDENT_AMBULATORY_CARE_PROVIDER_SITE_OTHER): Payer: 59 | Admitting: Psychiatry

## 2021-10-25 ENCOUNTER — Ambulatory Visit: Payer: Self-pay | Admitting: Podiatry

## 2021-10-25 ENCOUNTER — Other Ambulatory Visit: Payer: Self-pay

## 2021-10-25 ENCOUNTER — Encounter: Payer: Self-pay | Admitting: Psychiatry

## 2021-10-25 VITALS — BP 115/74 | HR 84

## 2021-10-25 DIAGNOSIS — F4010 Social phobia, unspecified: Secondary | ICD-10-CM | POA: Diagnosis not present

## 2021-10-25 DIAGNOSIS — F422 Mixed obsessional thoughts and acts: Secondary | ICD-10-CM | POA: Diagnosis not present

## 2021-10-25 DIAGNOSIS — F5105 Insomnia due to other mental disorder: Secondary | ICD-10-CM

## 2021-10-25 DIAGNOSIS — F339 Major depressive disorder, recurrent, unspecified: Secondary | ICD-10-CM | POA: Diagnosis not present

## 2021-10-25 MED ORDER — TEMAZEPAM 30 MG PO CAPS: 30.0000 mg | ORAL_CAPSULE | Freq: Every evening | ORAL | 5 refills | Status: DC | PRN

## 2021-10-26 NOTE — Progress Notes (Signed)
DIMPLES PROBUS [id number] August 01, 1966 56 y.o.    Subjective:   Patient ID:  Casey Diaz is a 56 y.o. (DOB 11-14-1965) female.  Chief Complaint:  Chief Complaint  Patient presents with   Follow-up   Depression   Anxiety   Fatigue     HPI Casey Diaz presents to the office today for follow-up of OCD and severe anxiety.     December 2019 visit the following was noted: No meds were changed. Lives in [place] and back for followup.  Sx are about the same.  Has to take meds with different sizes. Pt reports that mood is Anxious and Depressed and describes anxiety as Severe. Anxiety symptoms include: Excessive Worry, Obsessive Compulsive Symptoms:   Checking,,. Pt reports has interrupted sleep and nocturia. Pt reports that appetite is good. Pt reports that energy is no change and down slightly. Concentration is down slightly. Suicidal thoughts:  denied by patient. Loves the environment of [place] but misses some things there.  She's not able to work there.  H works there and likes it.  Struggled with not working, feels isolated and not up to task of meeting people.  Does attend a church and met a friend who's been helpful.  Leaving for [place] on 10/16/18.   04/09/2020 appointment the following is noted:  Staying another year in [place] bc Covid and other things. Last few months a lot of crying spells.  Is in menopause. Wonders about med changes though is nervous about it.  Crying spells associated with depressing thoughts more than stress or OCD.   Covid really hard on everyone and couldn't see family for 18 mos.  Family still very dysfunctional. No close friends in part due to OCD and depression. Son high Autism spectrum with ADHD and anxiety and she's with him all the time. Greater health problems with CP so more pains.   05/15/20 appt with the following noted: [name] for menopause and helps some. Still depressed.  Chronically. In [place] for 2 more weeks then to [place] for  another year. A lot of stressors lately triggering more checking and anxiety.   OCD is her CC now and seems.  Got worse DT stress.   Stressed with Asberger's son and her health.  H works a lot.  Her FOO still stress. Plan: Trintellix 10 mg 1 tablet in the morning with food and reduce fluvoxamine to 5 tablets nightly for 1 week  then reduce it to 4 tablets nightly.   07/02/20 appt with the following noted: Decided not to get Trintellix bc difficulty getting it. It is available.  There.  Wants to start it now.   Both depression and OCD are severe.  Not suicidal in intent or plan. Did not take samples with her to [place] but will be back in December. covid is worse there and travel is difficult.  Wants to reduce Wellbutrin DT dry mouth. Plan: She's afraid to reduce Luvox at this time DT fear of worsening OCD.  But will consider. Trintellix 10 mg 1 tablet in the morning with food and reduce fluvoxamine to 5 tablets nightly for 1 week  then reduce it to 4 tablets nightly. Also reduce Wellbutrin XL to 300 mg daily.    9-13 2022 appointment with the following noted: Back in [place] since July 14.  Broke arm a month ago and surgery.  It's all been rough adjustment.   B has cancer on his face and M fell taking him to the doctor.  Misses the water and weather of [place].   Cry a lot more since menopause. Still depression and anxiety and OCD.  Asks about ketamine. On Wellbutrin 300, Luvox 300.  No Trintellix. Added Ativan 2 mg AM and HS and it helps.  More likely to get upset at night. Plan: Increase Luvox back to 400 mg daily.  She thinks she's worse on less. Continue Wellbutrin XL to 300 mg daily. 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  She tolerated it well without unusual nausea or vomiting headache or other somatic symptoms.  She did have the expected dissociation which gradually resolved over  the course of the 2-hour period of observation.  She was a little concerned about her balance given her cerebral palsy but has not noted unusual or unexpected problems.  She is motivated to can continue Spravato in hopes of reducing her depressive symptoms. 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  She also has treatment resistant OCD which is partially managed with medications but is still quite disabling.  She is tolerating the medications well.  She is sleeping adequately.  Her appetite is adequate.  She is not having suicidal thoughts.  She continues to wish for a better treatment for OCD that would give her some relief.  09/30/2021 appointment with the following noted: She received her first dose of Spravato 84 mg intranasally today.  She tolerated it well without unusual nausea, vomiting, or other somatic symptoms.  Dissociation as expected did occur and gradually resolved over the 2-hour period of observation.  She did have a mild headache today with the treatment and received ibuprofen 600 mg at her request.  We will follow this to see if it is a pattern Patient is still depressed.  She said she was late with her medicine today and today was a particularly depressing day.  However she notes that the Spravato has lifted her mood considerably even today.  She is hopeful that it will continue to be helpful.  No suicidal thoughts.  She has ongoing chronic anxiety and OCD at baseline.  10/04/21 appt noted: Patient received Spravato 84 mg for the second time today.  She tolerated it well without any unusual headache, nausea or vomiting or other somatic symptoms.  Dissociation did occur and she gradually [place] resolution over the 2-hour period of observation. She did not have any unusual problems after she left the office last Spravato administration.  She did not have any specific problems with balance or walking.  She is at increased risk of that difficulty because of  cerebral palsy.  So far she has not noticed much mood effect from the medication beyond the first day of receiving it.  However she would like to continue Spravato in hopes of getting the antidepressant effect that is desired. Stress dealing with mother's behavior at party pt hosted.  Guilt over it.  10/07/2021 appointment noted: Patient received Spravato 84 mg for the second time today.  She tolerated it well without any unusual headache, nausea or vomiting or other somatic symptoms.  Dissociation did occur and she gradually [place] resolution over the 2-hour period of observation. She still is not sure about the antidepressant effect of Spravato.  Events over the holidays and demands, make it difficult to assess.  She still notes that the OCD tends to worsen the depression and vice versa.  She tolerates the  Spravato well and wants to continue the trial.  10/15/2021 appointment with the following noted: Patient received Spravato 84 mg for the second time today.  She tolerated it well without any unusual headache, nausea or vomiting or other somatic symptoms.  Dissociation did occur and she gradually [place] resolution over the 2-hour period of observation. Patient says it was somewhat difficult to evaluate the effect of the Spravato.  It was scheduled to be twice weekly for 4 weeks consecutively but the holidays have interfered with that administration.  She asked what specifically should be she should be looking for in order to assess improvement.  That was discussed.  The OCD is unchanged and the depression so far is not significantly different.  She still tolerates meds.  There have been no recent med changes  10/19/2021 appt noted: Patient received Spravato 84 mg for the second time today.  She tolerated it well without any unusual headache, nausea or vomiting or other somatic symptoms.  Dissociation did occur and she gradually [place] resolution over the 2-hour period of observation.  Patient was very upset  today because her cousin with whom she was close was found dead yesterday unexpectedly.  It has been difficult for her to judge the effect of Spravato recently because of various events as well as the holidays have interfered with administration frequency.  Previous psych med trials include Prozac, paroxetine, sertraline, fluvoxamine, venlafaxine, Anafranil with no response,  Wellbutrin, , Viibryd, Trintellix 10 1 month NR Geodon,  risperidone, Rexulti, Abilify,  Seroquel, Latuda 40 mg with irritability. lamotrigine lithium, BuSpar, Namenda,  pramipexole with no response, and Topamax, pindolol  ECT-MADRS    [place] Office Visit from 06/29/2021 in [facility] Total Score 36      Flowsheet Row Admission (Discharged) from 06/11/2021 in [facility] No Risk        Review of Systems:  Review of Systems  Cardiovascular:  Negative for chest pain and palpitations.  Gastrointestinal:  Negative for nausea.  Musculoskeletal:  Positive for arthralgias, back pain, gait problem and joint swelling.  Neurological:  Positive for weakness and headaches. Negative for tremors.   Medications: I have reviewed the patient's current medications.  Current Outpatient Medications  Medication Sig Dispense Refill   Abaloparatide (TYMLOS) 3120 MCG/1.56ML SOPN Inject into the skin.     Azelastine-Fluticasone 137-50 MCG/ACT SUSP Place 1-2 sprays into both nostrils daily.     baclofen (LIORESAL) 10 MG tablet Take 20 mg by mouth at bedtime as needed for muscle spasms.     buPROPion (WELLBUTRIN XL) 300 MG 24 hr tablet Take 1 tablet BY MOUTH EVERY MORNING 30 tablet 1   dicyclomine (BENTYL) 10 MG capsule Take 10 mg by mouth daily.     docusate sodium (COLACE) 100 MG capsule Take 1 capsule (100 mg total) by mouth 2 (two) times daily. (Patient taking differently: Take 100 mg by mouth daily.) 10 capsule 0   Esketamine HCl, 84 MG Dose, (SPRAVATO, 84 MG  DOSE,) 28 MG/DEVICE SOPK USE NASALLY AS DIRECTED ON PACKAGE LABEL TWICE A WEEK 3 each 3   fexofenadine (ALLEGRA) 180 MG tablet Take 180 mg by mouth daily.     fluvoxaMINE (LUVOX) 100 MG tablet Take 3 tablets (300 mg total) by mouth daily. 90 tablet 0   hydrocortisone (ANUSOL-HC) 2.5 % rectal cream Place rectally 2 (two) times daily. x 7-14 days 30 g 0   ketotifen (ZADITOR) 0.025 % ophthalmic solution Place 3  drops into both eyes at bedtime.     LORazepam (ATIVAN) 1 MG tablet Take 2 tablets (2 mg total) by mouth 2 (two) times daily. 120 tablet 2   magnesium gluconate (MAGONATE) 500 MG tablet Take 500 mg by mouth daily.     MIBELAS 24 FE 1-20 MG-MCG(24) CHEW Chew 1 tablet by mouth at bedtime as needed (bowel regularity).     Multiple Vitamins-Minerals (ADULT GUMMY PO) Take 2 tablets by mouth in the morning.     nitrofurantoin (MACRODANTIN) 100 MG capsule Take 100 mg by mouth as needed (For urinary tract infection.).      oxyCODONE-acetaminophen (PERCOCET/ROXICET) 5-325 MG tablet Take 1-2 tablets by mouth every 6 (six) hours as needed for severe pain. 50 tablet 0   polyethylene glycol (MIRALAX / GLYCOLAX) packet Take 17 g by mouth daily as needed for mild constipation. 14 each 0   psyllium (METAMUCIL) 58.6 % powder Take 1 packet by mouth daily as needed (constipation).     Vitamin D-Vitamin K (VITAMIN K2-VITAMIN D3 PO) Take 1-2 sprays by mouth daily.     temazepam (RESTORIL) 30 MG capsule Take 1 capsule (30 mg total) by mouth at bedtime as needed for sleep. 30 capsule 5   No current facility-administered medications for this visit.    Medication Side Effects: None   Allergies:  Allergies  Allergen Reactions   Hydrocodone Itching   Sulfamethoxazole-Trimethoprim Itching   Dust Mite Extract Other (See Comments)    Sneezing, watery eyes, runny nose   Latex Itching   Other Other (See Comments)    PT IS ALLERGIC TO CAT DANDER AND RAGWEED - Sneezing, watery eyes, runny nose    Pollen Extract  Other (See Comments)    Sneezing, watery eyes, runny nose     Past Medical History:  Diagnosis Date   Abnormal Pap smear 2011   hpv/mild dysplasia,cin1   Anxiety    Cerebral palsy (HCC)    right arm/leg   Cystocele    Depression    Headache    Neuromuscular disorder (HCC)    Cerebral Palsy   OCD (obsessive compulsive disorder)    Osteoporosis    Uterine prolaps     Family History  Problem Relation Age of Onset   Cancer Father        skin AND LUNG   Alcohol abuse Sister        CRACK COCAINE    Social History   Socioeconomic History   Marital status: Married    Spouse name: Not on file   Number of children: Not on file   Years of education: Not on file   Highest education level: Not on file  Occupational History   Not on file  Tobacco Use   Smoking status: Never   Smokeless tobacco: Never  Substance and Sexual Activity   Alcohol use: Not Currently    Comment: OCCASIONAL beer   Drug use: No   Sexual activity: Yes    Birth control/protection: Pill    Comment: LOESTRIN 24 FE  Other Topics Concern   Not on file  Social History Narrative   Not on file   Social Determinants of Health   Financial Resource Strain: Not on file  Food Insecurity: Not on file  Transportation Needs: Not on file  Physical Activity: Not on file  Stress: Not on file  Social Connections: Not on file  Intimate Partner Violence: Not on file    Past Medical History, Surgical history, Social history, and Family  history were reviewed and updated as appropriate.   Please see review of systems for further details on the patient's review from today.   Objective:   Physical Exam:  LMP  (LMP Unknown)   Physical Exam Constitutional:      General: She is not in acute distress. Musculoskeletal:        General: No deformity.  Neurological:     Mental Status: She is alert and oriented to person, place, and time.     Cranial Nerves: No dysarthria.     Motor: Weakness present.      Coordination: Coordination normal.     Gait: Gait abnormal.  Psychiatric:        Attention and Perception: Attention and perception normal. She does not perceive auditory or visual hallucinations.        Mood and Affect: Mood is anxious and depressed. Affect is not labile or blunt.        Speech: Speech normal. Speech is not rapid and pressured or slurred.        Behavior: Behavior normal. Behavior is not slowed or withdrawn. Behavior is cooperative.        Thought Content: Thought content normal. Thought content is not delusional. Thought content does not include homicidal or suicidal ideation. Thought content does not include suicidal plan.        Cognition and Memory: Cognition and memory normal. Cognition is not impaired.        Judgment: Judgment normal.     Comments: Insight intact Ongoing OCD remains fairly severe Chronic depression persistent    Lab Review:     Component Value Date/Time   NA 138 06/11/2021 0606   K 4.0 06/11/2021 0606   CL 107 06/11/2021 0606   CO2 26 06/11/2021 0606   GLUCOSE 90 06/11/2021 0606   BUN 18 06/11/2021 0606   CREATININE 0.81 06/11/2021 0606   CALCIUM 9.4 06/11/2021 0606   PROT 6.5 06/11/2021 0606   ALBUMIN 3.3 (L) 06/11/2021 0606   AST 17 06/11/2021 0606   ALT 14 06/11/2021 0606   ALKPHOS 141 (H) 06/11/2021 0606   BILITOT 0.2 (L) 06/11/2021 0606   GFRNONAA >60 06/11/2021 0606   GFRAA >60 07/09/2016 0438       Component Value Date/Time   WBC 5.8 06/11/2021 0606   RBC 4.12 06/11/2021 0606   HGB 12.5 06/11/2021 0606   HCT 39.7 06/11/2021 0606   PLT 299 06/11/2021 0606   MCV 96.4 06/11/2021 0606   MCH 30.3 06/11/2021 0606   MCHC 31.5 06/11/2021 0606   RDW 13.9 06/11/2021 0606   LYMPHSABS 1.9 06/11/2021 0606   MONOABS 0.5 06/11/2021 0606   EOSABS 0.1 06/11/2021 0606   BASOSABS 0.0 06/11/2021 0606    No results found for: POCLITH, LITHIUM   No results found for: PHENYTOIN, PHENOBARB, VALPROATE, CBMZ   .res Assessment: Plan:     Arieana was seen today for follow-up, depression, anxiety and fatigue.  Diagnoses and all orders for this visit:  Recurrent major depression resistant to treatment [facility])  Mixed obsessional thoughts and acts  Social anxiety disorder  Insomnia due to mental condition -     temazepam (RESTORIL) 30 MG capsule; Take 1 capsule (30 mg total) by mouth at bedtime as needed for sleep.   Options for TR sx.  Both Dx are TR and marked.  Impaired.  Her depression seems worse.  Perhaps some previous failed meds could be tried in high dosages.  Answered questions about ketamine and [place]  in detail.  Ketamine not very available at this time but might help both.  TMS more available but no TMS coil for OCD in [place].  We discussed side effects of ketamine and TMS.  Disc alternative for TR sx including Trintellix, Vraylar. Disc using the unusual combo of Trintellix and lower dose Fluvoxamine.   continue Luvox back to 400 mg daily.  She thinks she was worse on less. Continue Wellbutrin XL to 300 mg daily.   Disc Spravato DT TRD incl details and SE. Disc dosing and duration.  Pt with severe depression MADRS 36 on9/13/22 Patient was administered Spravato 84 mg intranasally dosage today today.  The patient experienced the typical dissociation which gradually resolved over the 2-hour period of observation.  There were no complications.  Specifically the patient did not have nausea or vomiting or headache.  Blood pressures remained within normal ranges at the 40-minute and 2-hour follow-up intervals.  By the time the 2-hour observation period was met the patient was alert and oriented and able to exit without assistance.  .  See nursing note for further details. Per protocol will continue Spravato to 84 mg next session.  Will plan twice weekly administrationfor a month.  Disc options in light of shortened week related to Christmas and New Year.  Disc effects of perimenopause on sx and risks of estrogen.    Ativan and  luvox could contribute to the fatigue but these appear necessary.   We discussed the short-term risks associated with benzodiazepines including sedation and increased fall risk among others.  Discussed long-term side effect risk including dependence, potential withdrawal symptoms, and the potential eventual dose-related risk of dementia.  But recent studies from 2020 dispute this association between benzodiazepines and dementia risk. Newer studies in 2020 do not support an association with dementia.  Supportive therapy dealing with stressful family interactions lately leading to guilt and tears.  Also her cousin died unexpectedly yesterday.  Discussed grief and the difference between that and depression.  No med changes today.   Follow-up twice weekly   Lynder Parents, MD, DFAPA  Please see After Visit Summary for patient specific instructions.  Future Appointments  Date Time Provider [place]  10/28/2021  3:00 PM Cottle, Billey Co., MD [facility] None  10/28/2021  3:00 PM CP-NURSE [facility] None  11/09/2021  1:30 PM Cottle, Billey Co., MD [facility] None  11/10/2021 11:15 AM Wallene Huh, DPM [facility]  11/22/2021 11:00 AM Blanchie Serve, PhD [facility] None  12/06/2021 11:00 AM Blanchie Serve, PhD [facility] None  12/20/2021 11:00 AM Blanchie Serve, PhD [facility] None  01/03/2022 11:00 AM Blanchie Serve, PhD [facility] None    No orders of the defined types were placed in this encounter.    -------------------------------

## 2021-10-26 NOTE — Progress Notes (Signed)
NURSE Visit:   Pt arrived for her 5th Spravato treatment, today she will get 84 mg (3 of the 28 mg), which is the maintenance dose for her treatment resistant depression, she was directed to the treatment room to get vitals taken first. B/P at 3:25 PM 129/81, 92. Pt instructed to blow her nose and to recline back at 45 degrees. Pt given first nasal spray (28 mg) administered by pt observed by nurse. There were 5 minutes between each dose, total of 84 mg. Tolerated well. Pt's medication is delivered by [facility] in [place] and stored inside a safe behind a locked door as well. Spravato is a CIII medication and has to be only given at a treatment facility and never given to patient.  Pt relaxes in recliner, lights dimmed and chatting with nurse. Pt's 40 minute vital signs at 4:05 PM 121/85 87. Pt is talkative, experiencing some dissociation but pleasant feeling she reports. [name] comes to discuss her care at the end of her treatment when her thoughts are clearer. No complaints of a headache today after her treatment. Discharge vitals at 5:25 PM 119/79, 91. Pt stable for discharge. Next treatment is Tuesday, January 3rd at 3:00 PM. Pt was observed on site a total of 120 minutes per FDA/REMS requirements. Pt was with nurse for clinical assessment a total of 55 minutes.    LOT [id number] EXP [id number]

## 2021-10-27 NOTE — Progress Notes (Signed)
NURSE Visit:   Pt arrived for her 6th Spravato treatment, today she will get 84 mg (3 of the 28 mg), which is the maintenance dose for her treatment resistant depression, she was directed to the treatment room to get vitals taken first. B/P at 3:15 PM 92/52, 92. Pt instructed to blow her nose and to recline back at 45 degrees. Pt given first nasal spray (28 mg) administered by pt observed by nurse. There were 5 minutes between each dose, total of 84 mg. Tolerated well. Pt's medication is delivered by [facility] in [place] and stored inside a safe behind a locked door as well. Spravato is a CIII medication and has to be only given at a treatment facility and never given to patient.  Pt relaxes in recliner, lights dimmed and chatting with nurse. Pt's 40 minute vital signs at 4:05 PM 127/80 87. Pt is talkative, experiencing some dissociation but pleasant feeling she reports. [name] comes to discuss her care at the end of her treatment when her thoughts are clearer. No complaints of a headache today after her treatment. Discharge vitals at 5:15 PM 121/84, 86. Pt stable for discharge. Next treatment is Tuesday, January 5th at 3:00 PM. Pt was observed on site a total of 120 minutes per FDA/REMS requirements. Pt was with nurse for clinical assessment a total of 55 minutes.    LOT [id number] EXP [id number]

## 2021-10-28 ENCOUNTER — Ambulatory Visit: Payer: 59

## 2021-10-28 ENCOUNTER — Ambulatory Visit (INDEPENDENT_AMBULATORY_CARE_PROVIDER_SITE_OTHER): Payer: 59 | Admitting: Psychiatry

## 2021-10-28 ENCOUNTER — Other Ambulatory Visit: Payer: Self-pay

## 2021-10-28 VITALS — BP 115/76 | HR 78

## 2021-10-28 DIAGNOSIS — F5105 Insomnia due to other mental disorder: Secondary | ICD-10-CM

## 2021-10-28 DIAGNOSIS — F4010 Social phobia, unspecified: Secondary | ICD-10-CM

## 2021-10-28 DIAGNOSIS — F422 Mixed obsessional thoughts and acts: Secondary | ICD-10-CM | POA: Diagnosis not present

## 2021-10-28 DIAGNOSIS — F339 Major depressive disorder, recurrent, unspecified: Secondary | ICD-10-CM

## 2021-10-29 NOTE — Progress Notes (Signed)
NURSE Visit:   Pt arrived for her 9th Spravato treatment, today she will get 84 mg (3 of the 28 mg), which is the maintenance dose for her treatment resistant depression, she was directed to the treatment room to get vitals taken first. B/P at 3:10 PM 118/74, 99. Pt instructed to blow her nose and to recline back at 45 degrees. Pt given first nasal spray (28 mg) administered by pt observed by nurse. There were 5 minutes between each dose, total of 84 mg. Tolerated well. Pt's medication is delivered by [facility] in [place] and stored inside a safe behind a locked door as well. Spravato is a CIII medication and has to be only given at a treatment facility and never given to patient.  Pt relaxes in recliner, lights turned off and less chatting with nurse seems tired and more relaxed today. Pt's 40 minute vital signs at 3:50 PM 122/72 90. Pt has no complaints today, reports doesn't feel "as strong" today. She did mention that to Dr. Clovis Pu when he comes to discuss her care at the end of her treatment when her thoughts are clearer. No complaints of a headache today after her treatment. Discharge vitals at 5:10 PM 115/76, 78. Pt stable for discharge. Next treatment is Monday, January 16th at 3:00 PM. Pt was observed on site a total of 120 minutes per FDA/REMS requirements. Pt was with nurse for clinical assessment a total of 55 minutes.    LOT [id number] EXP [id number]

## 2021-10-31 ENCOUNTER — Other Ambulatory Visit: Payer: Self-pay | Admitting: Psychiatry

## 2021-10-31 DIAGNOSIS — F422 Mixed obsessional thoughts and acts: Secondary | ICD-10-CM

## 2021-10-31 DIAGNOSIS — F339 Major depressive disorder, recurrent, unspecified: Secondary | ICD-10-CM

## 2021-10-31 NOTE — Progress Notes (Signed)
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 B/P at 3:15 PM 120/77, 91. Pt instructed to blow her nose and to recline back at 45 degrees. Pt given first nasal spray (28 mg) administered by pt observed by nurse. There were 5 minutes between each dose, total of 84 mg. Tolerated well. Pt's medication is delivered by [facility] in [place] and stored inside a safe behind a locked door as well. pt keeps the same insurance for the new year. Spravato is a CIII medication and has to be only given at a treatment facility and never given to patient.  Pt relaxes in recliner, lights dimmed and chatting with nurse. Pt's 40 minute vital signs at 3:50 PM 134/87 88. Pt is talkative, experiencing some dissociation but pleasant feeling she reports. [name] comes to discuss her care at the end of her treatment when her thoughts are clearer. No complaints of a headache today after her treatment. Discharge vitals at 5:15 PM 115/74, 84. Pt stable for discharge. Next treatment is this Thursday ,January 12th at 3:00 PM. Pt was observed on site a total of 120 minutes per FDA/REMS requirements. Pt was with nurse for clinical assessment a total of 55 minutes.    LOT [id number] EXP [id number]

## 2021-10-31 NOTE — Progress Notes (Signed)
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 B/P at 3:10 PM 102/77, 112. Pt instructed to blow her nose and to recline back at 45 degrees. Pt given first nasal spray (28 mg) administered by pt observed by nurse. There were 5 minutes between each dose, total of 84 mg. Tolerated well. Pt's medication is delivered by [facility] in [place] and stored inside a safe behind a locked door as well. Spravato is a CIII medication and has to be only given at a treatment facility and never given to patient.  Pt relaxes in recliner, lights dimmed and chatting with nurse. Pt's 40 minute vital signs at 3:50 PM 113/53 96. Pt is talkative, experiencing some dissociation but pleasant feeling she reports. Dr. Clovis Pu comes to discuss her care at the end of her treatment when her thoughts are clearer. No complaints of a headache today after her treatment. Discharge vitals at 5:10 PM 138/77, 95. Pt stable for discharge. Next treatment is Monday, January 9th at 3:00 PM. Pt was observed on site a total of 120 minutes per FDA/REMS requirements. Pt was with nurse for clinical assessment a total of 55 minutes.    [place] EXP [id number]

## 2021-11-01 ENCOUNTER — Other Ambulatory Visit: Payer: Self-pay | Admitting: Psychiatry

## 2021-11-01 ENCOUNTER — Other Ambulatory Visit: Payer: Self-pay

## 2021-11-01 ENCOUNTER — Ambulatory Visit (INDEPENDENT_AMBULATORY_CARE_PROVIDER_SITE_OTHER): Payer: 59 | Admitting: Psychiatry

## 2021-11-01 ENCOUNTER — Ambulatory Visit: Payer: 59

## 2021-11-01 VITALS — BP 125/88 | HR 103

## 2021-11-01 DIAGNOSIS — F339 Major depressive disorder, recurrent, unspecified: Secondary | ICD-10-CM

## 2021-11-01 DIAGNOSIS — F4010 Social phobia, unspecified: Secondary | ICD-10-CM | POA: Diagnosis not present

## 2021-11-01 DIAGNOSIS — F5105 Insomnia due to other mental disorder: Secondary | ICD-10-CM | POA: Diagnosis not present

## 2021-11-01 DIAGNOSIS — F422 Mixed obsessional thoughts and acts: Secondary | ICD-10-CM

## 2021-11-01 MED ORDER — FLUVOXAMINE MALEATE 100 MG PO TABS: 300.0000 mg | ORAL_TABLET | Freq: Every day | ORAL | 1 refills | Status: DC

## 2021-11-01 NOTE — Telephone Encounter (Signed)
She has been on 400 mg of fluvoxamine for quite some time.  I do not know if it is a new insurance or new year but apparently she needs PA.

## 2021-11-02 NOTE — Telephone Encounter (Signed)
I assume you updated her Rx to 3 daily instead of 4 as discussed? I was going to decline this one.

## 2021-11-02 NOTE — Progress Notes (Signed)
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 B/P at 3:10 PM 107/68, 72. Pt instructed to blow her nose and to recline back at 45 degrees. Pt given first nasal spray (28 mg) administered by pt observed by nurse. There were 5 minutes between each dose, total of 84 mg. Tolerated well. Pt's medication is delivered by [facility] in [place] and stored inside a safe behind a locked door as well. Spravato is a CIII medication and has to be only given at a treatment facility and never given to patient.  Pt relaxes in recliner, lights turned off and less chatting with nurse seems tired and more relaxed today. Pt's 40 minute vital signs at 3:50 PM 111/70 106. Pt has no complaints today. Dr. Clovis Pu comes to discuss her care at the end of her treatment when her thoughts are clearer. No complaints of a headache today after her treatment. Discharge vitals at 5:10 PM 125/88, 103. Pt stable for discharge. Next treatment is Monday, January 19h at 3:00 PM. Pt was observed on site a total of 120 minutes per FDA/REMS requirements. Pt was with nurse for clinical assessment a total of 55 minutes.    LOT [id number] EXP [id number]

## 2021-11-04 ENCOUNTER — Ambulatory Visit (INDEPENDENT_AMBULATORY_CARE_PROVIDER_SITE_OTHER): Payer: 59 | Admitting: Psychiatry

## 2021-11-04 ENCOUNTER — Other Ambulatory Visit: Payer: Self-pay

## 2021-11-04 ENCOUNTER — Ambulatory Visit: Payer: 59

## 2021-11-04 VITALS — BP 112/80 | HR 97

## 2021-11-04 DIAGNOSIS — F4010 Social phobia, unspecified: Secondary | ICD-10-CM

## 2021-11-04 DIAGNOSIS — F339 Major depressive disorder, recurrent, unspecified: Secondary | ICD-10-CM | POA: Diagnosis not present

## 2021-11-04 DIAGNOSIS — F422 Mixed obsessional thoughts and acts: Secondary | ICD-10-CM | POA: Diagnosis not present

## 2021-11-04 DIAGNOSIS — F5105 Insomnia due to other mental disorder: Secondary | ICD-10-CM

## 2021-11-07 ENCOUNTER — Encounter: Payer: Self-pay | Admitting: Psychiatry

## 2021-11-07 NOTE — Progress Notes (Signed)
DIMPLES PROBUS [id number] August 01, 1966 56 y.o.    Subjective:   Patient ID:  Daeja Helderman Sarnowski is a 56 y.o. (DOB 11-14-1965) female.  Chief Complaint:  Chief Complaint  Patient presents with   Follow-up   Depression   Anxiety   Fatigue     HPI [name] to the office today for follow-up of OCD and severe anxiety.     December 2019 visit the following was noted: No meds were changed. Lives in [place] and back for followup.  Sx are about the same.  Has to take meds with different sizes. Pt reports that mood is Anxious and Depressed and describes anxiety as Severe. Anxiety symptoms include: Excessive Worry, Obsessive Compulsive Symptoms:   Checking,,. Pt reports has interrupted sleep and nocturia. Pt reports that appetite is good. Pt reports that energy is no change and down slightly. Concentration is down slightly. Suicidal thoughts:  denied by patient. Loves the environment of [place] but misses some things there.  She's not able to work there.  H works there and likes it.  Struggled with not working, feels isolated and not up to task of meeting people.  Does attend a church and met a friend who's been helpful.  Leaving for [place] on 10/16/18.   04/09/2020 appointment the following is noted:  Staying another year in [place] bc Covid and other things. Last few months a lot of crying spells.  Is in menopause. Wonders about med changes though is nervous about it.  Crying spells associated with depressing thoughts more than stress or OCD.   Covid really hard on everyone and couldn't see family for 18 mos.  Family still very dysfunctional. No close friends in part due to OCD and depression. Son high Autism spectrum with ADHD and anxiety and she's with him all the time. Greater health problems with CP so more pains.   05/15/20 appt with the following noted: [name] for menopause and helps some. Still depressed.  Chronically. In [place] for 2 more weeks then to [place] for  another year. A lot of stressors lately triggering more checking and anxiety.   OCD is her CC now and seems.  Got worse DT stress.   Stressed with Asberger's son and her health.  H works a lot.  Her FOO still stress. Plan: Trintellix 10 mg 1 tablet in the morning with food and reduce fluvoxamine to 5 tablets nightly for 1 week  then reduce it to 4 tablets nightly.   07/02/20 appt with the following noted: Decided not to get Trintellix bc difficulty getting it. It is available.  There.  Wants to start it now.   Both depression and OCD are severe.  Not suicidal in intent or plan. Did not take samples with her to [place] but will be back in December. covid is worse there and travel is difficult.  Wants to reduce Wellbutrin DT dry mouth. Plan: She's afraid to reduce Luvox at this time DT fear of worsening OCD.  But will consider. Trintellix 10 mg 1 tablet in the morning with food and reduce fluvoxamine to 5 tablets nightly for 1 week  then reduce it to 4 tablets nightly. Also reduce Wellbutrin XL to 300 mg daily.    9-13 2022 appointment with the following noted: Back in [place] since July 14.  Broke arm a month ago and surgery.  It's all been rough adjustment.   B has cancer on his face and M fell taking him to the doctor.  Misses the water and weather of [place].   Cry a lot more since menopause. Still depression and anxiety and OCD.  Asks about ketamine. On Wellbutrin 300, Luvox 300.  No Trintellix. Added Ativan 2 mg AM and HS and it helps.  More likely to get upset at night. Plan: Increase Luvox back to 400 mg daily.  She thinks she's worse on less. Continue Wellbutrin XL to 300 mg daily. 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  She tolerated it well without unusual nausea or vomiting headache or other somatic symptoms.  She did have the expected dissociation which gradually resolved over  the course of the 2-hour period of observation.  She was a little concerned about her balance given her cerebral palsy but has not noted unusual or unexpected problems.  She is motivated to can continue Spravato in hopes of reducing her depressive symptoms. 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  She also has treatment resistant OCD which is partially managed with medications but is still quite disabling.  She is tolerating the medications well.  She is sleeping adequately.  Her appetite is adequate.  She is not having suicidal thoughts.  She continues to wish for a better treatment for OCD that would give her some relief.  09/30/2021 appointment with the following noted: She received her first dose of Spravato 84 mg intranasally today.  She tolerated it well without unusual nausea, vomiting, or other somatic symptoms.  Dissociation as expected did occur and gradually resolved over the 2-hour period of observation.  She did have a mild headache today with the treatment and received ibuprofen 600 mg at her request.  We will follow this to see if it is a pattern Patient is still depressed.  She said she was late with her medicine today and today was a particularly depressing day.  However she notes that the Spravato has lifted her mood considerably even today.  She is hopeful that it will continue to be helpful.  No suicidal thoughts.  She has ongoing chronic anxiety and OCD at baseline.  10/04/21 appt noted: Patient received Spravato 84 mg for the second time today.  She tolerated it well without any unusual headache, nausea or vomiting or other somatic symptoms.  Dissociation did occur and she gradually [place] resolution over the 2-hour period of observation. She did not have any unusual problems after she left the office last Spravato administration.  She did not have any specific problems with balance or walking.  She is at increased risk of that difficulty because of  cerebral palsy.  So far she has not noticed much mood effect from the medication beyond the first day of receiving it.  However she would like to continue Spravato in hopes of getting the antidepressant effect that is desired. Stress dealing with mother's behavior at party pt hosted.  Guilt over it.  10/07/2021 appointment noted: Patient received Spravato 84 mg for the second time today.  She tolerated it well without any unusual headache, nausea or vomiting or other somatic symptoms.  Dissociation did occur and she gradually [place] resolution over the 2-hour period of observation. She still is not sure about the antidepressant effect of Spravato.  Events over the holidays and demands, make it difficult to assess.  She still notes that the OCD tends to worsen the depression and vice versa.  She tolerates the  Spravato well and wants to continue the trial.  10/15/2021 appointment with the following noted: Patient received Spravato 84 mg for the second time today.  She tolerated it well without any unusual headache, nausea or vomiting or other somatic symptoms.  Dissociation did occur and she gradually [place] resolution over the 2-hour period of observation. Patient says it was somewhat difficult to evaluate the effect of the Spravato.  It was scheduled to be twice weekly for 4 weeks consecutively but the holidays have interfered with that administration.  She asked what specifically should be she should be looking for in order to assess improvement.  That was discussed.  The OCD is unchanged and the depression so far is not significantly different.  She still tolerates meds.  There have been no recent med changes  10/19/2021 appt noted: Patient received Spravato 84 mg for the second today.  She tolerated it well without any unusual headache, nausea or vomiting or other somatic symptoms.  Dissociation did occur and she gradually saw resolution over the 2-hour period of observation.   10/21/2021 appointment  noted: Patient received Spravato 84 mg today.  She tolerated it well without any unusual headache, nausea or vomiting or other somatic symptoms.  Dissociation did occur and she gradually saw resolution over the 2-hour period of observation.  She feels better than last week.  She is not as depressed and down.  She is still dealing with grief around the death of her cousin that was unexpected.  It is still difficult to tell how much the Spravato was doing but she is hopeful.  Anxiety is still present with the OCD.  She is not having suicidal thoughts.  She is not hopeless.  She wants to continue treatment.  10/25/2021 appointment with the following noted: Patient received Spravato 84 mg today.  She tolerated it well without any unusual headache, nausea or vomiting or other somatic symptoms.  Dissociation did occur and she gradually saw resolution over the 2-hour period of observation.  She does not typically find the dissociation very strong. She is beginning to think the Spravato is helping somewhat with the depression.  It has been difficult to tell with the holidays intervening as well as the death of her cousin.  She has not been able to get Spravato twice weekly for 4 weeks straight as typically planned.  However she is hopeful.  The OCD remains significant.  She still has a tendency to think very negatively.  She is not suicidal.  10/28/2021 appointment with the following noted: Patient received Spravato 84 mg today.  She tolerated it well without any unusual headache, nausea or vomiting or other somatic symptoms.  Dissociation did occur and she gradually saw resolution over the 2-hour period of observation.  She does not typically find the dissociation very strong. She is feeling more hopeful about the administration of Spravato.  She is having less depression she believes.  Still not dramatically different.  She still has a tendency to have a lot of anxiety and rumination and OCD.  She is not suicidal.   She is eager to continue the Spravato.  11/01/2021 appointment with the following noted: Patient received Spravato 84 mg today.  She tolerated it well without any unusual headache, nausea or vomiting or other somatic symptoms.  Dissociation did occur and she gradually saw resolution over the 2-hour period of observation.  She does not typically find the dissociation very strong. She is continuing to see a little bit of improvement in depression  with Spravato.  The anxiety remains but may be not as severe.  The OCD remains markedly severe chronically.  She is not suicidal.  She is encouraged by the degree of improvement with Spravato and inability to enjoy things more and not be quite as ruminative.  Previous psych med trials include Prozac, paroxetine, sertraline, fluvoxamine, venlafaxine, Anafranil with no response,  Wellbutrin, , Viibryd, Trintellix 10 1 month NR Geodon,  risperidone, Rexulti, Abilify,  Seroquel, Latuda 40 mg with irritability. lamotrigine lithium, BuSpar, Namenda,  pramipexole with no response, and Topamax, pindolol  ECT-MADRS    [place] Office Visit from 06/29/2021 in [place] Total Score 36      Flowsheet Row Admission (Discharged) from 06/11/2021 in [facility] No Risk        Review of Systems:  Review of Systems  Constitutional:  Positive for fatigue.  Cardiovascular:  Negative for chest pain and palpitations.  Musculoskeletal:  Positive for arthralgias, back pain, gait problem and joint swelling.  Neurological:  Positive for weakness. Negative for tremors.   Medications: I have reviewed the patient's current medications.  Current Outpatient Medications  Medication Sig Dispense Refill   Abaloparatide (TYMLOS) 3120 MCG/1.56ML SOPN Inject into the skin.     Azelastine-Fluticasone 137-50 MCG/ACT SUSP Place 1-2 sprays into both nostrils daily.     baclofen (LIORESAL) 10 MG tablet Take 20 mg  by mouth at bedtime as needed for muscle spasms.     buPROPion (WELLBUTRIN XL) 300 MG 24 hr tablet Take 1 tablet BY MOUTH EVERY MORNING 30 tablet 1   dicyclomine (BENTYL) 10 MG capsule Take 10 mg by mouth daily.     docusate sodium (COLACE) 100 MG capsule Take 1 capsule (100 mg total) by mouth 2 (two) times daily. (Patient taking differently: Take 100 mg by mouth daily.) 10 capsule 0   Esketamine HCl, 84 MG Dose, (SPRAVATO, 84 MG DOSE,) 28 MG/DEVICE SOPK USE NASALLY AS DIRECTED ON PACKAGE LABEL TWICE A WEEK 3 each 3   fexofenadine (ALLEGRA) 180 MG tablet Take 180 mg by mouth daily.     fluvoxaMINE (LUVOX) 100 MG tablet Take 3 tablets (300 mg total) by mouth at bedtime. 270 tablet 1   hydrocortisone (ANUSOL-HC) 2.5 % rectal cream Place rectally 2 (two) times daily. x 7-14 days 30 g 0   ketotifen (ZADITOR) 0.025 % ophthalmic solution Place 3 drops into both eyes at bedtime.     LORazepam (ATIVAN) 1 MG tablet Take 2 tablets (2 mg total) by mouth 2 (two) times daily. 120 tablet 2   magnesium gluconate (MAGONATE) 500 MG tablet Take 500 mg by mouth daily.     MIBELAS 24 FE 1-20 MG-MCG(24) CHEW Chew 1 tablet by mouth at bedtime as needed (bowel regularity).     Multiple Vitamins-Minerals (ADULT GUMMY PO) Take 2 tablets by mouth in the morning.     nitrofurantoin (MACRODANTIN) 100 MG capsule Take 100 mg by mouth as needed (For urinary tract infection.).      oxyCODONE-acetaminophen (PERCOCET/ROXICET) 5-325 MG tablet Take 1-2 tablets by mouth every 6 (six) hours as needed for severe pain. 50 tablet 0   polyethylene glycol (MIRALAX / GLYCOLAX) packet Take 17 g by mouth daily as needed for mild constipation. 14 each 0   psyllium (METAMUCIL) 58.6 % powder Take 1 packet by mouth daily as needed (constipation).     temazepam (RESTORIL) 30 MG capsule Take 1 capsule (30 mg total) by mouth  at bedtime as needed for sleep. 30 capsule 5   Vitamin D-Vitamin K (VITAMIN K2-VITAMIN D3 PO) Take 1-2 sprays by mouth daily.      No current facility-administered medications for this visit.    Medication Side Effects: None   Allergies:  Allergies  Allergen Reactions   Hydrocodone Itching   Sulfamethoxazole-Trimethoprim Itching   Dust Mite Extract Other (See Comments)    Sneezing, watery eyes, runny nose   Latex Itching   Other Other (See Comments)    PT IS ALLERGIC TO CAT DANDER AND RAGWEED - Sneezing, watery eyes, runny nose    Pollen Extract Other (See Comments)    Sneezing, watery eyes, runny nose     Past Medical History:  Diagnosis Date   Abnormal Pap smear 2011   hpv/mild dysplasia,cin1   Anxiety    Cerebral palsy (HCC)    right arm/leg   Cystocele    Depression    Headache    Neuromuscular disorder (HCC)    Cerebral Palsy   OCD (obsessive compulsive disorder)    Osteoporosis    Uterine prolaps     Family History  Problem Relation Age of Onset   Cancer Father        skin AND LUNG   Alcohol abuse Sister        CRACK COCAINE    Social History   Socioeconomic History   Marital status: Married    Spouse name: Not on file   Number of children: Not on file   Years of education: Not on file   Highest education level: Not on file  Occupational History   Not on file  Tobacco Use   Smoking status: Never   Smokeless tobacco: Never  Substance and Sexual Activity   Alcohol use: Not Currently    Comment: OCCASIONAL beer   Drug use: No   Sexual activity: Yes    Birth control/protection: Pill    Comment: LOESTRIN 24 FE  Other Topics Concern   Not on file  Social History Narrative   Not on file   Social Determinants of Health   Financial Resource Strain: Not on file  Food Insecurity: Not on file  Transportation Needs: Not on file  Physical Activity: Not on file  Stress: Not on file  Social Connections: Not on file  Intimate Partner Violence: Not on file    Past Medical History, Surgical history, Social history, and Family history were reviewed and updated as  appropriate.   Please see review of systems for further details on the patient's review from today.   Objective:   Physical Exam:  LMP  (LMP Unknown)   Physical Exam Constitutional:      General: She is not in acute distress. Neurological:     Mental Status: She is alert and oriented to person, place, and time.     Cranial Nerves: No dysarthria.     Motor: Weakness present.     Coordination: Coordination normal.     Gait: Gait abnormal.  Psychiatric:        Attention and Perception: Attention and perception normal. She does not perceive auditory or visual hallucinations.        Mood and Affect: Mood is anxious and depressed. Affect is not labile, blunt or tearful.        Speech: Speech normal. Speech is not rapid and pressured or slurred.        Behavior: Behavior normal. Behavior is not slowed or withdrawn. Behavior is cooperative.  Thought Content: Thought content normal. Thought content is not delusional. Thought content does not include homicidal or suicidal ideation. Thought content does not include suicidal plan.        Cognition and Memory: Cognition and memory normal. Cognition is not impaired.        Judgment: Judgment normal.     Comments: Insight intact Ongoing OCD remains fairly severe Chronic depression persistent but better     Lab Review:     Component Value Date/Time   NA 138 06/11/2021 0606   K 4.0 06/11/2021 0606   CL 107 06/11/2021 0606   CO2 26 06/11/2021 0606   GLUCOSE 90 06/11/2021 0606   BUN 18 06/11/2021 0606   CREATININE 0.81 06/11/2021 0606   CALCIUM 9.4 06/11/2021 0606   PROT 6.5 06/11/2021 0606   ALBUMIN 3.3 (L) 06/11/2021 0606   AST 17 06/11/2021 0606   ALT 14 06/11/2021 0606   ALKPHOS 141 (H) 06/11/2021 0606   BILITOT 0.2 (L) 06/11/2021 0606   GFRNONAA >60 06/11/2021 0606   GFRAA >60 07/09/2016 0438       Component Value Date/Time   WBC 5.8 06/11/2021 0606   RBC 4.12 06/11/2021 0606   HGB 12.5 06/11/2021 0606   HCT 39.7  06/11/2021 0606   PLT 299 06/11/2021 0606   MCV 96.4 06/11/2021 0606   MCH 30.3 06/11/2021 0606   MCHC 31.5 06/11/2021 0606   RDW 13.9 06/11/2021 0606   LYMPHSABS 1.9 06/11/2021 0606   MONOABS 0.5 06/11/2021 0606   EOSABS 0.1 06/11/2021 0606   BASOSABS 0.0 06/11/2021 0606    No results found for: POCLITH, LITHIUM   No results found for: PHENYTOIN, PHENOBARB, VALPROATE, CBMZ   .res Assessment: Plan:    Jeraldine was seen today for follow-up, depression, anxiety and fatigue.  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  Mixed obsessional thoughts and acts -     fluvoxaMINE (LUVOX) 100 MG tablet; Take 3 tablets (300 mg total) by mouth at bedtime.  Social anxiety disorder  Insomnia due to mental condition    Both Dx are TR and marked.  Impaired.  She is receiving Spravato 84 mg twice weekly and so far is seeing a bit of improvement.  She has had an eventful last month which makes it somewhat difficult to evaluate.  The OCD remains severe and ongoing and it is difficult to tell the effect of Spravato so far on OCD.  Disc alternative for TR sx including Trintellix, Vraylar. Disc using the unusual combo of Trintellix and lower dose Fluvoxamine.   continue Luvox 300 mg daily.   Consider reevaluating dose.  Disc dosing Continue Wellbutrin XL to 300 mg daily.   Disc Spravato DT TRD incl details and SE. Disc dosing and duration.  Pt with severe depression MADRS 36 on9/13/22 Patient was administered Spravato 84 mg intranasally dosage today today.  The patient experienced the typical dissociation which gradually resolved over the 2-hour period of observation.  There were no complications.  Specifically the patient did not have nausea or vomiting or headache.  Blood pressures remained within normal ranges at the 40-minute and 2-hour follow-up  intervals.  By the time the 2-hour observation period was met the patient was alert and oriented and able to exit without assistance. She tends to have lingering sedative effects but not severe. Marland Kitchen  See nursing note for further details. Per protocol will continue Spravato to 84 mg next session.  Will plan twice weekly administrationfor a month.    We discussed the short-term risks associated with benzodiazepines including sedation and increased fall risk among others.  Discussed long-term side effect risk including dependence, potential withdrawal symptoms, and the potential eventual dose-related risk of dementia.  But recent studies from 2020 dispute this association between benzodiazepines and dementia risk. Newer studies in 2020 do not support an association with dementia.  Supportive therapy dealing with stressful family interactions lately leading to guilt and tears.  Also her cousin died unexpectedly recently.  Discussed grief and the difference between that and depression.  No med changes today.   Follow-up twice weekly   Lynder Parents, MD, DFAPA  Please see After Visit Summary for patient specific instructions.  Future Appointments  Date Time Provider [place]  11/08/2021  3:00 PM Cottle, Billey Co., MD [facility] None  11/08/2021  3:00 PM CP-NURSE [facility] None  11/10/2021 11:15 AM Wallene Huh, DPM [facility]  11/11/2021  3:00 PM Cottle, Billey Co., MD [facility] None  11/11/2021  3:00 PM CP-NURSE [facility] None  11/22/2021 11:00 AM Blanchie Serve, PhD [facility] None  12/06/2021 11:00 AM Blanchie Serve, PhD [facility] None  12/16/2021 11:30 AM Cottle, Billey Co., MD [facility] None  12/20/2021 11:00 AM Blanchie Serve, PhD [facility] None  01/03/2022 11:00 AM Blanchie Serve, PhD [facility] None    No orders of the defined types were placed in this encounter.    -------------------------------

## 2021-11-07 NOTE — Progress Notes (Signed)
Casey Diaz [id number] 07/01/1966 56 y.o.    Subjective:   Patient ID:  Casey Diaz is a 56 y.o. (DOB [birth year]) female.  Chief Complaint:  Chief Complaint  Patient presents with   Follow-up   Depression   Anxiety     HPI Casey Diaz presents to the office today for follow-up of OCD and severe anxiety.     December 2019 visit the following was noted: No meds were changed. Lives in [place] and back for followup.  Sx are about the same.  Has to take meds with different sizes. Pt reports that mood is Anxious and Depressed and describes anxiety as Severe. Anxiety symptoms include: Excessive Worry, Obsessive Compulsive Symptoms:   Checking,,. Pt reports has interrupted sleep and nocturia. Pt reports that appetite is good. Pt reports that energy is no change and down slightly. Concentration is down slightly. Suicidal thoughts:  denied by patient. Loves the environment of [place] but misses some things there.  She's not able to work there.  H works there and likes it.  Struggled with not working, feels isolated and not up to task of meeting people.  Does attend a church and met a friend who's been helpful.  Leaving for [place] on 10/16/18.   04/09/2020 appointment the following is noted:  Staying another year in [place] bc Covid and other things. Last few months a lot of crying spells.  Is in menopause. Wonders about med changes though is nervous about it.  Crying spells associated with depressing thoughts more than stress or OCD.   Covid really hard on everyone and couldn't see family for 18 mos.  Family still very dysfunctional. No close friends in part due to OCD and depression. Son high Autism spectrum with ADHD and anxiety and she's with him all the time. Greater health problems with CP so more pains.   05/15/20 appt with the following noted: Casey Diaz for menopause and helps some. Still depressed.  Chronically. In [place] for 2 more weeks then to [place] for another  year. A lot of stressors lately triggering more checking and anxiety.   OCD is her CC now and seems.  Got worse DT stress.   Stressed with Asberger's son and her health.  H works a lot.  Her FOO still stress. Plan: Trintellix 10 mg 1 tablet in the morning with food and reduce fluvoxamine to 5 tablets nightly for 1 week  then reduce it to 4 tablets nightly.   07/02/20 appt with the following noted: Decided not to get Trintellix bc difficulty getting it. It is available.  There.  Wants to start it now.   Both depression and OCD are severe.  Not suicidal in intent or plan. Did not take samples with her to [place] but will be back in December. covid is worse there and travel is difficult.  Wants to reduce Wellbutrin DT dry mouth. Plan: She's afraid to reduce Luvox at this time DT fear of worsening OCD.  But will consider. Trintellix 10 mg 1 tablet in the morning with food and reduce fluvoxamine to 5 tablets nightly for 1 week  then reduce it to 4 tablets nightly. Also reduce Wellbutrin XL to 300 mg daily.    9-13 2022 appointment with the following noted: Back in [place] since July 14.  Broke arm a month ago and surgery.  It's all been rough adjustment.   B has cancer on his face and M fell taking him to the doctor.  Misses the water  and weather of [place].   Cry a lot more since menopause. Still depression and anxiety and OCD.  Asks about ketamine. On Wellbutrin 300, Luvox 300.  No Trintellix. Added Ativan 2 mg AM and HS and it helps.  More likely to get upset at night. Plan: Increase Luvox back to 400 mg daily.  She thinks she's worse on less. Continue Wellbutrin XL to 300 mg daily. 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  She tolerated it well without unusual nausea or vomiting headache or other somatic symptoms.  She did have the expected dissociation which gradually resolved over the course  of the 2-hour period of observation.  She was a little concerned about her balance given her cerebral palsy but has not noted unusual or unexpected problems.  She is motivated to can continue Spravato in hopes of reducing her depressive symptoms. 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  She also has treatment resistant OCD which is partially managed with medications but is still quite disabling.  She is tolerating the medications well.  She is sleeping adequately.  Her appetite is adequate.  She is not having suicidal thoughts.  She continues to wish for a better treatment for OCD that would give her some relief.  09/30/2021 appointment with the following noted: She received her first dose of Spravato 84 mg intranasally today.  She tolerated it well without unusual nausea, vomiting, or other somatic symptoms.  Dissociation as expected did occur and gradually resolved over the 2-hour period of observation.  She did have a mild headache today with the treatment and received ibuprofen 600 mg at her request.  We will follow this to see if it is a pattern Patient is still depressed.  She said she was late with her medicine today and today was a particularly depressing day.  However she notes that the Spravato has lifted her mood considerably even today.  She is hopeful that it will continue to be helpful.  No suicidal thoughts.  She has ongoing chronic anxiety and OCD at baseline.  10/04/21 appt noted: Patient received Spravato 84 mg for the second time today.  She tolerated it well without any unusual headache, nausea or vomiting or other somatic symptoms.  Dissociation did occur and she gradually [place] resolution over the 2-hour period of observation. She did not have any unusual problems after she left the office last Spravato administration.  She did not have any specific problems with balance or walking.  She is at increased risk of that difficulty because of cerebral  palsy.  So far she has not noticed much mood effect from the medication beyond the first day of receiving it.  However she would like to continue Spravato in hopes of getting the antidepressant effect that is desired. Stress dealing with mother's behavior at party pt hosted.  Guilt over it.  10/07/2021 appointment noted: Patient received Spravato 84 mg for the second time today.  She tolerated it well without any unusual headache, nausea or vomiting or other somatic symptoms.  Dissociation did occur and she gradually [place] resolution over the 2-hour period of observation. She still is not sure about the antidepressant effect of Spravato.  Events over the holidays and demands, make it difficult to assess.  She still notes that the OCD tends to worsen the depression and vice Casey Diaz.  She tolerates the Spravato well and  wants to continue the trial.  10/15/2021 appointment with the following noted: Patient received Spravato 84 mg for the second time today.  She tolerated it well without any unusual headache, nausea or vomiting or other somatic symptoms.  Dissociation did occur and she gradually [place] resolution over the 2-hour period of observation. Patient says it was somewhat difficult to evaluate the effect of the Spravato.  It was scheduled to be twice weekly for 4 weeks consecutively but the holidays have interfered with that administration.  She asked what specifically should be she should be looking for in order to assess improvement.  That was discussed.  The OCD is unchanged and the depression so far is not significantly different.  She still tolerates meds.  There have been no recent med changes  10/19/2021 appt noted: Patient received Spravato 84 mg for the second today.  She tolerated it well without any unusual headache, nausea or vomiting or other somatic symptoms.  Dissociation did occur and she gradually saw resolution over the 2-hour period of observation.   10/21/2021 appointment noted: Patient  received Spravato 84 mg today.  She tolerated it well without any unusual headache, nausea or vomiting or other somatic symptoms.  Dissociation did occur and she gradually saw resolution over the 2-hour period of observation.  She feels better than last week.  She is not as depressed and down.  She is still dealing with grief around the death of her cousin that was unexpected.  It is still difficult to tell how much the Spravato was doing but she is hopeful.  Anxiety is still present with the OCD.  She is not having suicidal thoughts.  She is not hopeless.  She wants to continue treatment.  10/25/2021 appointment with the following noted: Patient received Spravato 84 mg today.  She tolerated it well without any unusual headache, nausea or vomiting or other somatic symptoms.  Dissociation did occur and she gradually saw resolution over the 2-hour period of observation.  She does not typically find the dissociation very strong. She is beginning to think the Spravato is helping somewhat with the depression.  It has been difficult to tell with the holidays intervening as well as the death of her cousin.  She has not been able to get Spravato twice weekly for 4 weeks straight as typically planned.  However she is hopeful.  The OCD remains significant.  She still has a tendency to think very negatively.  She is not suicidal.  10/28/2021 appointment with the following noted: Patient received Spravato 84 mg today.  She tolerated it well without any unusual headache, nausea or vomiting or other somatic symptoms.  Dissociation did occur and she gradually saw resolution over the 2-hour period of observation.  She does not typically find the dissociation very strong. She is feeling more hopeful about the administration of Spravato.  She is having less depression she believes.  Still not dramatically different.  She still has a tendency to have a lot of anxiety and rumination and OCD.  She is not suicidal.  She is eager to  continue the Spravato.  Previous psych med trials include Prozac, paroxetine, sertraline, fluvoxamine, venlafaxine, Anafranil with no response,  Wellbutrin, , Viibryd, Trintellix 10 1 month NR Geodon,  risperidone, Rexulti, Abilify,  Seroquel, Latuda 40 mg with irritability. lamotrigine lithium, BuSpar, Namenda,  pramipexole with no response, and Topamax, pindolol  ECT-MADRS    [place] Office Visit from 06/29/2021 in [facility]  MADRS Total Score 36  Flowsheet Row Admission (Discharged) from 06/11/2021 in [facility] No Risk        Review of Systems:  Review of Systems  Cardiovascular:  Negative for chest pain and palpitations.  Musculoskeletal:  Positive for arthralgias, back pain, gait problem and joint swelling.  Neurological:  Positive for weakness. Negative for tremors.   Medications: I have reviewed the patient's current medications.  Current Outpatient Medications  Medication Sig Dispense Refill   Abaloparatide (TYMLOS) 3120 MCG/1.56ML SOPN Inject into the skin.     Azelastine-Fluticasone 137-50 MCG/ACT SUSP Place 1-2 sprays into both nostrils daily.     baclofen (LIORESAL) 10 MG tablet Take 20 mg by mouth at bedtime as needed for muscle spasms.     buPROPion (WELLBUTRIN XL) 300 MG 24 hr tablet Take 1 tablet BY MOUTH EVERY MORNING 30 tablet 1   dicyclomine (BENTYL) 10 MG capsule Take 10 mg by mouth daily.     docusate sodium (COLACE) 100 MG capsule Take 1 capsule (100 mg total) by mouth 2 (two) times daily. (Patient taking differently: Take 100 mg by mouth daily.) 10 capsule 0   Esketamine HCl, 84 MG Dose, (SPRAVATO, 84 MG DOSE,) 28 MG/DEVICE SOPK USE NASALLY AS DIRECTED ON PACKAGE LABEL TWICE A WEEK 3 each 3   fexofenadine (ALLEGRA) 180 MG tablet Take 180 mg by mouth daily.     fluvoxaMINE (LUVOX) 100 MG tablet Take 3 tablets (300 mg total) by mouth at bedtime. 270 tablet 1   hydrocortisone (ANUSOL-HC)  2.5 % rectal cream Place rectally 2 (two) times daily. x 7-14 days 30 g 0   ketotifen (ZADITOR) 0.025 % ophthalmic solution Place 3 drops into both eyes at bedtime.     LORazepam (ATIVAN) 1 MG tablet Take 2 tablets (2 mg total) by mouth 2 (two) times daily. 120 tablet 2   magnesium gluconate (MAGONATE) 500 MG tablet Take 500 mg by mouth daily.     MIBELAS 24 FE 1-20 MG-MCG(24) CHEW Chew 1 tablet by mouth at bedtime as needed (bowel regularity).     Multiple Vitamins-Minerals (ADULT GUMMY PO) Take 2 tablets by mouth in the morning.     nitrofurantoin (MACRODANTIN) 100 MG capsule Take 100 mg by mouth as needed (For urinary tract infection.).      oxyCODONE-acetaminophen (PERCOCET/ROXICET) 5-325 MG tablet Take 1-2 tablets by mouth every 6 (six) hours as needed for severe pain. 50 tablet 0   polyethylene glycol (MIRALAX / GLYCOLAX) packet Take 17 g by mouth daily as needed for mild constipation. 14 each 0   psyllium (METAMUCIL) 58.6 % powder Take 1 packet by mouth daily as needed (constipation).     temazepam (RESTORIL) 30 MG capsule Take 1 capsule (30 mg total) by mouth at bedtime as needed for sleep. 30 capsule 5   Vitamin D-Vitamin K (VITAMIN K2-VITAMIN D3 PO) Take 1-2 sprays by mouth daily.     No current facility-administered medications for this visit.    Medication Side Effects: None   Allergies:  Allergies  Allergen Reactions   Hydrocodone Itching   Sulfamethoxazole-Trimethoprim Itching   Dust Mite Extract Other (See Comments)    Sneezing, watery eyes, runny nose   Latex Itching   Other Other (See Comments)    PT IS ALLERGIC TO CAT DANDER AND RAGWEED - Sneezing, watery eyes, runny nose    Pollen Extract Other (See Comments)    Sneezing, watery eyes, runny nose     Past Medical History:  Diagnosis  Date   Abnormal Pap smear 2011   hpv/mild dysplasia,cin1   Anxiety    Cerebral palsy (HCC)    right arm/leg   Cystocele    Depression    Headache    Neuromuscular disorder  (HCC)    Cerebral Palsy   OCD (obsessive compulsive disorder)    Osteoporosis    Uterine prolaps     Family History  Problem Relation Age of Onset   Cancer Father        skin AND LUNG   Alcohol abuse Sister        CRACK COCAINE    Social History   Socioeconomic History   Marital status: Married    Spouse name: Not on file   Number of children: Not on file   Years of education: Not on file   Highest education level: Not on file  Occupational History   Not on file  Tobacco Use   Smoking status: Never   Smokeless tobacco: Never  Substance and Sexual Activity   Alcohol use: Not Currently    Comment: OCCASIONAL beer   Drug use: No   Sexual activity: Yes    Birth control/protection: Pill    Comment: LOESTRIN 24 FE  Other Topics Concern   Not on file  Social History Narrative   Not on file   Social Determinants of Health   Financial Resource Strain: Not on file  Food Insecurity: Not on file  Transportation Needs: Not on file  Physical Activity: Not on file  Stress: Not on file  Social Connections: Not on file  Intimate Partner Violence: Not on file    Past Medical History, Surgical history, Social history, and Family history were reviewed and updated as appropriate.   Please see review of systems for further details on the patient's review from today.   Objective:   Physical Exam:  LMP  (LMP Unknown)   Physical Exam Constitutional:      General: She is not in acute distress. Neurological:     Mental Status: She is alert and oriented to person, place, and time.     Cranial Nerves: No dysarthria.     Motor: Weakness present.     Coordination: Coordination normal.     Gait: Gait abnormal.  Psychiatric:        Attention and Perception: Attention and perception normal. She does not perceive auditory or visual hallucinations.        Mood and Affect: Mood is anxious and depressed. Affect is not labile, blunt or tearful.        Speech: Speech normal. Speech is  not rapid and pressured or slurred.        Behavior: Behavior normal. Behavior is not slowed or withdrawn. Behavior is cooperative.        Thought Content: Thought content normal. Thought content is not delusional. Thought content does not include homicidal or suicidal ideation. Thought content does not include suicidal plan.        Cognition and Memory: Cognition and memory normal. Cognition is not impaired.        Judgment: Judgment normal.     Comments: Insight intact Ongoing OCD remains fairly severe Chronic depression persistent but maybe  better     Lab Review:     Component Value Date/Time   NA 138 06/11/2021 0606   K 4.0 06/11/2021 0606   CL 107 06/11/2021 0606   CO2 26 06/11/2021 0606   GLUCOSE 90 06/11/2021 0606   BUN 18 06/11/2021  0606   CREATININE 0.81 06/11/2021 0606   CALCIUM 9.4 06/11/2021 0606   PROT 6.5 06/11/2021 0606   ALBUMIN 3.3 (L) 06/11/2021 0606   AST 17 06/11/2021 0606   ALT 14 06/11/2021 0606   ALKPHOS 141 (H) 06/11/2021 0606   BILITOT 0.2 (L) 06/11/2021 0606   GFRNONAA >60 06/11/2021 0606   GFRAA >60 07/09/2016 0438       Component Value Date/Time   WBC 5.8 06/11/2021 0606   RBC 4.12 06/11/2021 0606   HGB 12.5 06/11/2021 0606   HCT 39.7 06/11/2021 0606   PLT 299 06/11/2021 0606   MCV 96.4 06/11/2021 0606   MCH 30.3 06/11/2021 0606   MCHC 31.5 06/11/2021 0606   RDW 13.9 06/11/2021 0606   LYMPHSABS 1.9 06/11/2021 0606   MONOABS 0.5 06/11/2021 0606   EOSABS 0.1 06/11/2021 0606   BASOSABS 0.0 06/11/2021 0606    No results found for: POCLITH, LITHIUM   No results found for: PHENYTOIN, PHENOBARB, VALPROATE, CBMZ   .res Assessment: Plan:    Casey Diaz was seen today for follow-up, depression and anxiety.  Diagnoses and all orders for this visit:  Recurrent major depression resistant to treatment [facility])  Mixed obsessional thoughts and acts  Social anxiety disorder  Insomnia due to mental condition    Both Dx are TR and marked.  Impaired.     Disc alternative for TR sx including Trintellix, Vraylar. Disc using the unusual combo of Trintellix and lower dose Fluvoxamine.   continue Luvox 300 mg daily.   Consider reevaluating dose.  Disc dosing Continue Wellbutrin XL to 300 mg daily.   Disc Spravato DT TRD incl details and SE. Disc dosing and duration.  Pt with severe depression MADRS 36 on9/13/22 Patient was administered Spravato 84 mg intranasally dosage today today.  The patient experienced the typical dissociation which gradually resolved over the 2-hour period of observation.  There were no complications.  Specifically the patient did not have nausea or vomiting or headache.  Blood pressures remained within normal ranges at the 40-minute and 2-hour follow-up intervals.  By the time the 2-hour observation period was met the patient was alert and oriented and able to exit without assistance. She tends to have lingering sedative effects but not severe. .  See nursing note for further details. Per protocol will continue Spravato to 84 mg next session.  Will plan twice weekly administrationfor a month.    We discussed the short-term risks associated with benzodiazepines including sedation and increased fall risk among others.  Discussed long-term side effect risk including dependence, potential withdrawal symptoms, and the potential eventual dose-related risk of dementia.  But recent studies from 2020 dispute this association between benzodiazepines and dementia risk. Newer studies in 2020 do not support an association with dementia.  Supportive therapy dealing with stressful family interactions lately leading to guilt and tears.  Also her cousin died unexpectedly recently.  Discussed grief and the difference between that and depression.  No med changes today.   Follow-up twice weekly   Lynder Parents, MD, DFAPA  Please see After Visit Summary for patient specific instructions.  Future Appointments  Date Time Provider [place]  11/08/2021  3:00 PM Cottle, Billey Co., MD [facility] None  11/08/2021  3:00 PM CP-NURSE [facility] None  11/10/2021 11:15 AM Wallene Huh, DPM [facility]  11/11/2021  3:00 PM Cottle, Billey Co., MD CP-[facility] None  11/11/2021  3:00 PM CP-NURSE CP-[facility] None  11/22/2021 11:00 AM Blanchie Serve, PhD CP-[facility]  None  12/06/2021 11:00 AM Blanchie Serve, PhD CP-[facility] None  12/16/2021 11:30 AM Cottle, Billey Co., MD CP-[facility] None  12/20/2021 11:00 AM Blanchie Serve, PhD [facility] None  01/03/2022 11:00 AM Blanchie Serve, PhD [facility] None    No orders of the defined types were placed in this encounter.    -------------------------------

## 2021-11-07 NOTE — Progress Notes (Signed)
DIMPLES PROBUS [id number] August 01, 1966 56 y.o.    Subjective:   Patient ID:  Casey Diaz is a 56 y.o. (DOB 11-14-1965) female.  Chief Complaint:  Chief Complaint  Patient presents with   Follow-up   Depression   Anxiety   Fatigue     HPI Casey Diaz presents to the office today for follow-up of OCD and severe anxiety.     December 2019 visit the following was noted: No meds were changed. Lives in [place] and back for followup.  Sx are about the same.  Has to take meds with different sizes. Pt reports that mood is Anxious and Depressed and describes anxiety as Severe. Anxiety symptoms include: Excessive Worry, Obsessive Compulsive Symptoms:   Checking,,. Pt reports has interrupted sleep and nocturia. Pt reports that appetite is good. Pt reports that energy is no change and down slightly. Concentration is down slightly. Suicidal thoughts:  denied by patient. Loves the environment of [place] but misses some things there.  She's not able to work there.  H works there and likes it.  Struggled with not working, feels isolated and not up to task of meeting people.  Does attend a church and met a friend who's been helpful.  Leaving for [place] on 10/16/18.   04/09/2020 appointment the following is noted:  Staying another year in [place] bc Covid and other things. Last few months a lot of crying spells.  Is in menopause. Wonders about med changes though is nervous about it.  Crying spells associated with depressing thoughts more than stress or OCD.   Covid really hard on everyone and couldn't see family for 18 mos.  Family still very dysfunctional. No close friends in part due to OCD and depression. Son high Autism spectrum with ADHD and anxiety and she's with him all the time. Greater health problems with CP so more pains.   05/15/20 appt with the following noted: Casey Diaz for menopause and helps some. Still depressed.  Chronically. In [place] for 2 more weeks then to [place] for  another year. A lot of stressors lately triggering more checking and anxiety.   OCD is her CC now and seems.  Got worse DT stress.   Stressed with Asberger's son and her health.  H works a lot.  Her FOO still stress. Plan: Trintellix 10 mg 1 tablet in the morning with food and reduce fluvoxamine to 5 tablets nightly for 1 week  then reduce it to 4 tablets nightly.   07/02/20 appt with the following noted: Decided not to get Trintellix bc difficulty getting it. It is available.  There.  Wants to start it now.   Both depression and OCD are severe.  Not suicidal in intent or plan. Did not take samples with her to [place] but will be back in December. covid is worse there and travel is difficult.  Wants to reduce Wellbutrin DT dry mouth. Plan: She's afraid to reduce Luvox at this time DT fear of worsening OCD.  But will consider. Trintellix 10 mg 1 tablet in the morning with food and reduce fluvoxamine to 5 tablets nightly for 1 week  then reduce it to 4 tablets nightly. Also reduce Wellbutrin XL to 300 mg daily.    9-13 2022 appointment with the following noted: Back in [place] since July 14.  Broke arm a month ago and surgery.  It's all been rough adjustment.   B has cancer on his face and M fell taking him to the doctor.  Misses the water and weather of [place].   Cry a lot more since menopause. Still depression and anxiety and OCD.  Asks about ketamine. On Wellbutrin 300, Luvox 300.  No Trintellix. Added Ativan 2 mg AM and HS and it helps.  More likely to get upset at night. Plan: Increase Luvox back to 400 mg daily.  She thinks she's worse on less. Continue Wellbutrin XL to 300 mg daily. 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  She tolerated it well without unusual nausea or vomiting headache or other somatic symptoms.  She did have the expected dissociation which gradually resolved over  the course of the 2-hour period of observation.  She was a little concerned about her balance given her cerebral palsy but has not noted unusual or unexpected problems.  She is motivated to can continue Spravato in hopes of reducing her depressive symptoms. 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  She also has treatment resistant OCD which is partially managed with medications but is still quite disabling.  She is tolerating the medications well.  She is sleeping adequately.  Her appetite is adequate.  She is not having suicidal thoughts.  She continues to wish for a better treatment for OCD that would give her some relief.  09/30/2021 appointment with the following noted: She received her first dose of Spravato 84 mg intranasally today.  She tolerated it well without unusual nausea, vomiting, or other somatic symptoms.  Dissociation as expected did occur and gradually resolved over the 2-hour period of observation.  She did have a mild headache today with the treatment and received ibuprofen 600 mg at her request.  We will follow this to see if it is a pattern Patient is still depressed.  She said she was late with her medicine today and today was a particularly depressing day.  However she notes that the Spravato has lifted her mood considerably even today.  She is hopeful that it will continue to be helpful.  No suicidal thoughts.  She has ongoing chronic anxiety and OCD at baseline.  10/04/21 appt noted: Patient received Spravato 84 mg for the second time today.  She tolerated it well without any unusual headache, nausea or vomiting or other somatic symptoms.  Dissociation did occur and she gradually [place] resolution over the 2-hour period of observation. She did not have any unusual problems after she left the office last Spravato administration.  She did not have any specific problems with balance or walking.  She is at increased risk of that difficulty because of  cerebral palsy.  So far she has not noticed much mood effect from the medication beyond the first day of receiving it.  However she would like to continue Spravato in hopes of getting the antidepressant effect that is desired. Stress dealing with mother's behavior at party pt hosted.  Guilt over it.  10/07/2021 appointment noted: Patient received Spravato 84 mg for the second time today.  She tolerated it well without any unusual headache, nausea or vomiting or other somatic symptoms.  Dissociation did occur and she gradually [place] resolution over the 2-hour period of observation. She still is not sure about the antidepressant effect of Spravato.  Events over the holidays and demands, make it difficult to assess.  She still notes that the OCD tends to worsen the depression and vice versa.  She tolerates the  Spravato well and wants to continue the trial.  10/15/2021 appointment with the following noted: Patient received Spravato 84 mg for the second time today.  She tolerated it well without any unusual headache, nausea or vomiting or other somatic symptoms.  Dissociation did occur and she gradually [place] resolution over the 2-hour period of observation. Patient says it was somewhat difficult to evaluate the effect of the Spravato.  It was scheduled to be twice weekly for 4 weeks consecutively but the holidays have interfered with that administration.  She asked what specifically should be she should be looking for in order to assess improvement.  That was discussed.  The OCD is unchanged and the depression so far is not significantly different.  She still tolerates meds.  There have been no recent med changes  10/19/2021 appt noted: Patient received Spravato 84 mg for the second today.  She tolerated it well without any unusual headache, nausea or vomiting or other somatic symptoms.  Dissociation did occur and she gradually saw resolution over the 2-hour period of observation.   10/21/2021 appointment  noted: Patient received Spravato 84 mg today.  She tolerated it well without any unusual headache, nausea or vomiting or other somatic symptoms.  Dissociation did occur and she gradually saw resolution over the 2-hour period of observation.  She feels better than last week.  She is not as depressed and down.  She is still dealing with grief around the death of her cousin that was unexpected.  It is still difficult to tell how much the Spravato was doing but she is hopeful.  Anxiety is still present with the OCD.  She is not having suicidal thoughts.  She is not hopeless.  She wants to continue treatment.  10/25/2021 appointment with the following noted: Patient received Spravato 84 mg today.  She tolerated it well without any unusual headache, nausea or vomiting or other somatic symptoms.  Dissociation did occur and she gradually saw resolution over the 2-hour period of observation.  She does not typically find the dissociation very strong. She is beginning to think the Spravato is helping somewhat with the depression.  It has been difficult to tell with the holidays intervening as well as the death of her cousin.  She has not been able to get Spravato twice weekly for 4 weeks straight as typically planned.  However she is hopeful.  The OCD remains significant.  She still has a tendency to think very negatively.  She is not suicidal.  10/28/2021 appointment with the following noted: Patient received Spravato 84 mg today.  She tolerated it well without any unusual headache, nausea or vomiting or other somatic symptoms.  Dissociation did occur and she gradually saw resolution over the 2-hour period of observation.  She does not typically find the dissociation very strong. She is feeling more hopeful about the administration of Spravato.  She is having less depression she believes.  Still not dramatically different.  She still has a tendency to have a lot of anxiety and rumination and OCD.  She is not suicidal.   She is eager to continue the Spravato.  11/01/2021 appointment with the following noted: Patient received Spravato 84 mg today.  She tolerated it well without any unusual headache, nausea or vomiting or other somatic symptoms.  Dissociation did occur and she gradually saw resolution over the 2-hour period of observation.  She does not typically find the dissociation very strong. She is continuing to see a little bit of improvement in depression  with Spravato.  The anxiety remains but may be not as severe.  The OCD remains markedly severe chronically.  She is not suicidal.  She is encouraged by the degree of improvement with Spravato and inability to enjoy things more and not be quite as ruminative.  11/04/2021 appt noted: Patient received Spravato 84 mg today.  She tolerated it well without any unusual headache, nausea or vomiting or other somatic symptoms.  Dissociation did occur and she gradually saw resolution over the 2-hour period of observation.  She does not typically find the dissociation very strong. No SE complaints with meds. She continues to feel hopeful about the Spravato.  She has less depression.  Because of a number of factors she is uncertain of the full benefit but thinks she is somewhat less depressed.  Her anxiety and OCD remain significant but a little better.  She is tolerating the medications and does not desire medicine change.  She is not currently complaining of insomnia.   Previous psych med trials include Prozac, paroxetine, sertraline, fluvoxamine, venlafaxine, Anafranil with no response,  Wellbutrin, , Viibryd, Trintellix 10 1 month NR Geodon,  risperidone, Rexulti, Abilify,  Seroquel, Latuda 40 mg with irritability. lamotrigine lithium, BuSpar, Namenda,  pramipexole with no response, and Topamax, pindolol  ECT-MADRS    [facility] Office Visit from 06/29/2021 in [facility] Total Score 36      Flowsheet Row Admission (Discharged) from  06/11/2021 in [facility] No Risk        Review of Systems:  Review of Systems  Constitutional:  Positive for fatigue.  Cardiovascular:  Negative for palpitations.  Musculoskeletal:  Positive for arthralgias, back pain, gait problem and joint swelling.  Neurological:  Positive for weakness. Negative for tremors.   Medications: I have reviewed the patient's current medications.  Current Outpatient Medications  Medication Sig Dispense Refill   Abaloparatide (TYMLOS) 3120 MCG/1.56ML SOPN Inject into the skin.     Azelastine-Fluticasone 137-50 MCG/ACT SUSP Place 1-2 sprays into both nostrils daily.     baclofen (LIORESAL) 10 MG tablet Take 20 mg by mouth at bedtime as needed for muscle spasms.     buPROPion (WELLBUTRIN XL) 300 MG 24 hr tablet Take 1 tablet BY MOUTH EVERY MORNING 30 tablet 1   dicyclomine (BENTYL) 10 MG capsule Take 10 mg by mouth daily.     docusate sodium (COLACE) 100 MG capsule Take 1 capsule (100 mg total) by mouth 2 (two) times daily. (Patient taking differently: Take 100 mg by mouth daily.) 10 capsule 0   Esketamine HCl, 84 MG Dose, (SPRAVATO, 84 MG DOSE,) 28 MG/DEVICE SOPK USE NASALLY AS DIRECTED ON PACKAGE LABEL TWICE A WEEK 3 each 3   fexofenadine (ALLEGRA) 180 MG tablet Take 180 mg by mouth daily.     fluvoxaMINE (LUVOX) 100 MG tablet Take 3 tablets (300 mg total) by mouth at bedtime. 270 tablet 1   hydrocortisone (ANUSOL-HC) 2.5 % rectal cream Place rectally 2 (two) times daily. x 7-14 days 30 g 0   ketotifen (ZADITOR) 0.025 % ophthalmic solution Place 3 drops into both eyes at bedtime.     LORazepam (ATIVAN) 1 MG tablet Take 2 tablets (2 mg total) by mouth 2 (two) times daily. 120 tablet 2   magnesium gluconate (MAGONATE) 500 MG tablet Take 500 mg by mouth daily.     MIBELAS 24 FE 1-20 MG-MCG(24) CHEW Chew 1 tablet by mouth at bedtime as needed (bowel regularity).  Multiple Vitamins-Minerals (ADULT GUMMY PO) Take 2  tablets by mouth in the morning.     nitrofurantoin (MACRODANTIN) 100 MG capsule Take 100 mg by mouth as needed (For urinary tract infection.).      oxyCODONE-acetaminophen (PERCOCET/ROXICET) 5-325 MG tablet Take 1-2 tablets by mouth every 6 (six) hours as needed for severe pain. 50 tablet 0   polyethylene glycol (MIRALAX / GLYCOLAX) packet Take 17 g by mouth daily as needed for mild constipation. 14 each 0   psyllium (METAMUCIL) 58.6 % powder Take 1 packet by mouth daily as needed (constipation).     temazepam (RESTORIL) 30 MG capsule Take 1 capsule (30 mg total) by mouth at bedtime as needed for sleep. 30 capsule 5   Vitamin D-Vitamin K (VITAMIN K2-VITAMIN D3 PO) Take 1-2 sprays by mouth daily.     No current facility-administered medications for this visit.    Medication Side Effects: None   Allergies:  Allergies  Allergen Reactions   Hydrocodone Itching   Sulfamethoxazole-Trimethoprim Itching   Dust Mite Extract Other (See Comments)    Sneezing, watery eyes, runny nose   Latex Itching   Other Other (See Comments)    PT IS ALLERGIC TO CAT DANDER AND RAGWEED - Sneezing, watery eyes, runny nose    Pollen Extract Other (See Comments)    Sneezing, watery eyes, runny nose     Past Medical History:  Diagnosis Date   Abnormal Pap smear 2011   hpv/mild dysplasia,cin1   Anxiety    Cerebral palsy (HCC)    right arm/leg   Cystocele    Depression    Headache    Neuromuscular disorder (HCC)    Cerebral Palsy   OCD (obsessive compulsive disorder)    Osteoporosis    Uterine prolaps     Family History  Problem Relation Age of Onset   Cancer Father        skin AND LUNG   Alcohol abuse Sister        CRACK COCAINE    Social History   Socioeconomic History   Marital status: Married    Spouse name: Not on file   Number of children: Not on file   Years of education: Not on file   Highest education level: Not on file  Occupational History   Not on file  Tobacco Use    Smoking status: Never   Smokeless tobacco: Never  Substance and Sexual Activity   Alcohol use: Not Currently    Comment: OCCASIONAL beer   Drug use: No   Sexual activity: Yes    Birth control/protection: Pill    Comment: LOESTRIN 24 FE  Other Topics Concern   Not on file  Social History Narrative   Not on file   Social Determinants of Health   Financial Resource Strain: Not on file  Food Insecurity: Not on file  Transportation Needs: Not on file  Physical Activity: Not on file  Stress: Not on file  Social Connections: Not on file  Intimate Partner Violence: Not on file    Past Medical History, Surgical history, Social history, and Family history were reviewed and updated as appropriate.   Please see review of systems for further details on the patient's review from today.   Objective:   Physical Exam:  LMP  (LMP Unknown)   Physical Exam Constitutional:      General: She is not in acute distress. Neurological:     Mental Status: She is alert and oriented to person, place, and  time.     Cranial Nerves: No dysarthria.     Motor: Weakness present.     Gait: Gait abnormal.  Psychiatric:        Attention and Perception: Attention and perception normal. She does not perceive auditory or visual hallucinations.        Mood and Affect: Mood is anxious and depressed. Affect is not labile, blunt or tearful.        Speech: Speech normal. Speech is not rapid and pressured or slurred.        Behavior: Behavior normal. Behavior is not slowed or withdrawn. Behavior is cooperative.        Thought Content: Thought content normal. Thought content is not delusional. Thought content does not include homicidal or suicidal ideation. Thought content does not include suicidal plan.        Cognition and Memory: Cognition and memory normal. Cognition is not impaired.        Judgment: Judgment normal.     Comments: Insight intact Ongoing OCD remains fairly severe but less anxious Chronic  depression persistent but better     Lab Review:     Component Value Date/Time   NA 138 06/11/2021 0606   K 4.0 06/11/2021 0606   CL 107 06/11/2021 0606   CO2 26 06/11/2021 0606   GLUCOSE 90 06/11/2021 0606   BUN 18 06/11/2021 0606   CREATININE 0.81 06/11/2021 0606   CALCIUM 9.4 06/11/2021 0606   PROT 6.5 06/11/2021 0606   ALBUMIN 3.3 (L) 06/11/2021 0606   AST 17 06/11/2021 0606   ALT 14 06/11/2021 0606   ALKPHOS 141 (H) 06/11/2021 0606   BILITOT 0.2 (L) 06/11/2021 0606   GFRNONAA >60 06/11/2021 0606   GFRAA >60 07/09/2016 0438       Component Value Date/Time   WBC 5.8 06/11/2021 0606   RBC 4.12 06/11/2021 0606   HGB 12.5 06/11/2021 0606   HCT 39.7 06/11/2021 0606   PLT 299 06/11/2021 0606   MCV 96.4 06/11/2021 0606   MCH 30.3 06/11/2021 0606   MCHC 31.5 06/11/2021 0606   RDW 13.9 06/11/2021 0606   LYMPHSABS 1.9 06/11/2021 0606   MONOABS 0.5 06/11/2021 0606   EOSABS 0.1 06/11/2021 0606   BASOSABS 0.0 06/11/2021 0606    No results found for: POCLITH, LITHIUM   No results found for: PHENYTOIN, PHENOBARB, VALPROATE, CBMZ   .res Assessment: Plan:    Inette was seen today for follow-up, depression, anxiety and fatigue.  Diagnoses and all orders for this visit:  Recurrent major depression resistant to treatment [facility])  Mixed obsessional thoughts and acts  Social anxiety disorder  Insomnia due to mental condition    Both Dx are TR and marked.  Impaired.  She is receiving Spravato 84 mg twice weekly and so far is seeing a bit of improvement.  She has had an eventful last month which makes it somewhat difficult to evaluate.  The OCD remains severe and ongoing and it is difficult to tell the effect of Spravato so far on OCD.  Disc alternative for TR sx including Trintellix, Vraylar. Disc using the unusual combo of Trintellix and lower dose Fluvoxamine.   continue Luvox 300 mg daily.   Consider reevaluating dose.  Disc dosing Continue Wellbutrin XL to 300 mg  daily.   Disc Spravato DT TRD incl details and SE. Disc dosing and duration.  Pt with severe depression MADRS 36 on9/13/22 Patient was administered Spravato 84 mg intranasally dosage today today.  The patient experienced  the typical dissociation which gradually resolved over the 2-hour period of observation.  There were no complications.  Specifically the patient did not have nausea or vomiting or headache.  Blood pressures remained within normal ranges at the 40-minute and 2-hour follow-up intervals.  By the time the 2-hour observation period was met the patient was alert and oriented and able to exit without assistance. She tends to have lingering sedative effects but not severe. .  See nursing note for further details. Per protocol will continue Spravato to 84 mg next session.  Will plan twice weekly administrationfor a month.    We discussed the short-term risks associated with benzodiazepines including sedation and increased fall risk among others.  Discussed long-term side effect risk including dependence, potential withdrawal symptoms, and the potential eventual dose-related risk of dementia.  But recent studies from 2020 dispute this association between benzodiazepines and dementia risk. Newer studies in 2020 do not support an association with dementia.  Supportive therapy dealing with stressful family interactions lately leading to guilt and tears.  Also her cousin died unexpectedly recently.  Discussed grief and the difference between that and depression.  No med changes today.   Follow-up twice weekly another week bc uncertain she has received full benefit of twice weekly.   Lynder Parents, MD, DFAPA  Please see After Visit Summary for patient specific instructions.  Future Appointments  Date Time Provider [place]  11/08/2021  3:00 PM Cottle, Billey Co., MD [facility] None  11/08/2021  3:00 PM CP-NURSE [facility] None  11/10/2021 11:15 AM Wallene Huh, DPM [facility]   11/11/2021  3:00 PM Cottle, Billey Co., MD [facility] None  11/11/2021  3:00 PM CP-NURSE [facility] None  11/22/2021 11:00 AM Blanchie Serve, PhD [facility] None  12/06/2021 11:00 AM Blanchie Serve, PhD [facility] None  12/16/2021 11:30 AM Cottle, Billey Co., MD [facility] None  12/20/2021 11:00 AM Blanchie Serve, PhD [facility] None  01/03/2022 11:00 AM Blanchie Serve, PhD [facility] None    No orders of the defined types were placed in this encounter.    -------------------------------

## 2021-11-07 NOTE — Progress Notes (Signed)
Casey Diaz [id number] 07/01/1966 56 y.o.    Subjective:   Patient ID:  Casey Diaz is a 56 y.o. (DOB [birth year]) female.  Chief Complaint:  Chief Complaint  Patient presents with   Follow-up   Depression   Anxiety     HPI Casey Diaz presents to the office today for follow-up of OCD and severe anxiety.     December 2019 visit the following was noted: No meds were changed. Lives in [place] and back for followup.  Sx are about the same.  Has to take meds with different sizes. Pt reports that mood is Anxious and Depressed and describes anxiety as Severe. Anxiety symptoms include: Excessive Worry, Obsessive Compulsive Symptoms:   Checking,,. Pt reports has interrupted sleep and nocturia. Pt reports that appetite is good. Pt reports that energy is no change and down slightly. Concentration is down slightly. Suicidal thoughts:  denied by patient. Loves the environment of [place] but misses some things there.  She's not able to work there.  H works there and likes it.  Struggled with not working, feels isolated and not up to task of meeting people.  Does attend a church and met a friend who's been helpful.  Leaving for [place] on 10/16/18.   04/09/2020 appointment the following is noted:  Staying another year in [place] bc Covid and other things. Last few months a lot of crying spells.  Is in menopause. Wonders about med changes though is nervous about it.  Crying spells associated with depressing thoughts more than stress or OCD.   Covid really hard on everyone and couldn't see family for 18 mos.  Family still very dysfunctional. No close friends in part due to OCD and depression. Son high Autism spectrum with ADHD and anxiety and she's with him all the time. Greater health problems with CP so more pains.   05/15/20 appt with the following noted: Casey Diaz for menopause and helps some. Still depressed.  Chronically. In [place] for 2 more weeks then to [place] for another  year. A lot of stressors lately triggering more checking and anxiety.   OCD is her CC now and seems.  Got worse DT stress.   Stressed with Asberger's son and her health.  H works a lot.  Her FOO still stress. Plan: Trintellix 10 mg 1 tablet in the morning with food and reduce fluvoxamine to 5 tablets nightly for 1 week  then reduce it to 4 tablets nightly.   07/02/20 appt with the following noted: Decided not to get Trintellix bc difficulty getting it. It is available.  There.  Wants to start it now.   Both depression and OCD are severe.  Not suicidal in intent or plan. Did not take samples with her to [place] but will be back in December. covid is worse there and travel is difficult.  Wants to reduce Wellbutrin DT dry mouth. Plan: She's afraid to reduce Luvox at this time DT fear of worsening OCD.  But will consider. Trintellix 10 mg 1 tablet in the morning with food and reduce fluvoxamine to 5 tablets nightly for 1 week  then reduce it to 4 tablets nightly. Also reduce Wellbutrin XL to 300 mg daily.    9-13 2022 appointment with the following noted: Back in [place] since July 14.  Broke arm a month ago and surgery.  It's all been rough adjustment.   B has cancer on his face and M fell taking him to the doctor.  Misses the water  and weather of [place].   Cry a lot more since menopause. Still depression and anxiety and OCD.  Asks about ketamine. On Wellbutrin 300, Luvox 300.  No Trintellix. Added Ativan 2 mg AM and HS and it helps.  More likely to get upset at night. Plan: Increase Luvox back to 400 mg daily.  She thinks she's worse on less. Continue Wellbutrin XL to 300 mg daily. 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  She tolerated it well without unusual nausea or vomiting headache or other somatic symptoms.  She did have the expected dissociation which gradually resolved over the course  of the 2-hour period of observation.  She was a little concerned about her balance given her cerebral palsy but has not noted unusual or unexpected problems.  She is motivated to can continue Spravato in hopes of reducing her depressive symptoms. 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  She also has treatment resistant OCD which is partially managed with medications but is still quite disabling.  She is tolerating the medications well.  She is sleeping adequately.  Her appetite is adequate.  She is not having suicidal thoughts.  She continues to wish for a better treatment for OCD that would give her some relief.  09/30/2021 appointment with the following noted: She received her first dose of Spravato 84 mg intranasally today.  She tolerated it well without unusual nausea, vomiting, or other somatic symptoms.  Dissociation as expected did occur and gradually resolved over the 2-hour period of observation.  She did have a mild headache today with the treatment and received ibuprofen 600 mg at her request.  We will follow this to see if it is a pattern Patient is still depressed.  She said she was late with her medicine today and today was a particularly depressing day.  However she notes that the Spravato has lifted her mood considerably even today.  She is hopeful that it will continue to be helpful.  No suicidal thoughts.  She has ongoing chronic anxiety and OCD at baseline.  10/04/21 appt noted: Patient received Spravato 84 mg for the second time today.  She tolerated it well without any unusual headache, nausea or vomiting or other somatic symptoms.  Dissociation did occur and she gradually [place] resolution over the 2-hour period of observation. She did not have any unusual problems after she left the office last Spravato administration.  She did not have any specific problems with balance or walking.  She is at increased risk of that difficulty because of cerebral  palsy.  So far she has not noticed much mood effect from the medication beyond the first day of receiving it.  However she would like to continue Spravato in hopes of getting the antidepressant effect that is desired. Stress dealing with mother's behavior at party pt hosted.  Guilt over it.  10/07/2021 appointment noted: Patient received Spravato 84 mg for the second time today.  She tolerated it well without any unusual headache, nausea or vomiting or other somatic symptoms.  Dissociation did occur and she gradually [place] resolution over the 2-hour period of observation. She still is not sure about the antidepressant effect of Spravato.  Events over the holidays and demands, make it difficult to assess.  She still notes that the OCD tends to worsen the depression and vice versa.  She tolerates the Spravato well and  wants to continue the trial.  10/15/2021 appointment with the following noted: Patient received Spravato 84 mg for the second time today.  She tolerated it well without any unusual headache, nausea or vomiting or other somatic symptoms.  Dissociation did occur and she gradually [place] resolution over the 2-hour period of observation. Patient says it was somewhat difficult to evaluate the effect of the Spravato.  It was scheduled to be twice weekly for 4 weeks consecutively but the holidays have interfered with that administration.  She asked what specifically should be she should be looking for in order to assess improvement.  That was discussed.  The OCD is unchanged and the depression so far is not significantly different.  She still tolerates meds.  There have been no recent med changes  10/19/2021 appt noted: Patient received Spravato 84 mg for the second today.  She tolerated it well without any unusual headache, nausea or vomiting or other somatic symptoms.  Dissociation did occur and she gradually saw resolution over the 2-hour period of observation.   10/21/2021 appointment noted: Patient  received Spravato 84 mg today.  She tolerated it well without any unusual headache, nausea or vomiting or other somatic symptoms.  Dissociation did occur and she gradually saw resolution over the 2-hour period of observation.  She feels better than last week.  She is not as depressed and down.  She is still dealing with grief around the death of her cousin that was unexpected.  It is still difficult to tell how much the Spravato was doing but she is hopeful.  Anxiety is still present with the OCD.  She is not having suicidal thoughts.  She is not hopeless.  She wants to continue treatment.  10/25/2021 appointment with the following noted: Patient received Spravato 84 mg today.  She tolerated it well without any unusual headache, nausea or vomiting or other somatic symptoms.  Dissociation did occur and she gradually saw resolution over the 2-hour period of observation.  She does not typically find the dissociation very strong.  She is beginning to think the Spravato is helping somewhat with the depression.  It has been difficult to tell with the holidays intervening as well as the death of her cousin.  She has not been able to get Spravato twice weekly for 4 weeks straight as typically planned.  However she is hopeful.  The OCD remains significant.  She still has a tendency to think very negatively.  She is not suicidal.  Previous psych med trials include Prozac, paroxetine, sertraline, fluvoxamine, venlafaxine, Anafranil with no response,  Wellbutrin, , Viibryd, Trintellix 10 1 month NR Geodon,  risperidone, Rexulti, Abilify,  Seroquel, Latuda 40 mg with irritability. lamotrigine lithium, BuSpar, Namenda,  pramipexole with no response, and Topamax, pindolol  ECT-MADRS    [facility] Office Visit from 06/29/2021 in [facility] Total Score 36      Flowsheet Row Admission (Discharged) from 06/11/2021 in [facility] No Risk         Review of Systems:  Review of Systems  Cardiovascular:  Negative for chest pain and palpitations.  Musculoskeletal:  Positive for arthralgias, back pain, gait problem and joint swelling.  Neurological:  Positive for weakness. Negative for tremors and headaches.   Medications: I have reviewed the patient's current medications.  Current Outpatient Medications  Medication Sig Dispense Refill   Abaloparatide (TYMLOS) 3120 MCG/1.56ML SOPN Inject into the skin.     Azelastine-Fluticasone 137-50 MCG/ACT  SUSP Place 1-2 sprays into both nostrils daily.     baclofen (LIORESAL) 10 MG tablet Take 20 mg by mouth at bedtime as needed for muscle spasms.     buPROPion (WELLBUTRIN XL) 300 MG 24 hr tablet Take 1 tablet BY MOUTH EVERY MORNING 30 tablet 1   dicyclomine (BENTYL) 10 MG capsule Take 10 mg by mouth daily.     docusate sodium (COLACE) 100 MG capsule Take 1 capsule (100 mg total) by mouth 2 (two) times daily. (Patient taking differently: Take 100 mg by mouth daily.) 10 capsule 0   Esketamine HCl, 84 MG Dose, (SPRAVATO, 84 MG DOSE,) 28 MG/DEVICE SOPK USE NASALLY AS DIRECTED ON PACKAGE LABEL TWICE A WEEK 3 each 3   fexofenadine (ALLEGRA) 180 MG tablet Take 180 mg by mouth daily.     fluvoxaMINE (LUVOX) 100 MG tablet Take 3 tablets (300 mg total) by mouth at bedtime. 270 tablet 1   hydrocortisone (ANUSOL-HC) 2.5 % rectal cream Place rectally 2 (two) times daily. x 7-14 days 30 g 0   ketotifen (ZADITOR) 0.025 % ophthalmic solution Place 3 drops into both eyes at bedtime.     LORazepam (ATIVAN) 1 MG tablet Take 2 tablets (2 mg total) by mouth 2 (two) times daily. 120 tablet 2   magnesium gluconate (MAGONATE) 500 MG tablet Take 500 mg by mouth daily.     MIBELAS 24 FE 1-20 MG-MCG(24) CHEW Chew 1 tablet by mouth at bedtime as needed (bowel regularity).     Multiple Vitamins-Minerals (ADULT GUMMY PO) Take 2 tablets by mouth in the morning.     nitrofurantoin (MACRODANTIN) 100 MG capsule Take 100 mg  by mouth as needed (For urinary tract infection.).      oxyCODONE-acetaminophen (PERCOCET/ROXICET) 5-325 MG tablet Take 1-2 tablets by mouth every 6 (six) hours as needed for severe pain. 50 tablet 0   polyethylene glycol (MIRALAX / GLYCOLAX) packet Take 17 g by mouth daily as needed for mild constipation. 14 each 0   psyllium (METAMUCIL) 58.6 % powder Take 1 packet by mouth daily as needed (constipation).     temazepam (RESTORIL) 30 MG capsule Take 1 capsule (30 mg total) by mouth at bedtime as needed for sleep. 30 capsule 5   Vitamin D-Vitamin K (VITAMIN K2-VITAMIN D3 PO) Take 1-2 sprays by mouth daily.     No current facility-administered medications for this visit.    Medication Side Effects: None   Allergies:  Allergies  Allergen Reactions   Hydrocodone Itching   Sulfamethoxazole-Trimethoprim Itching   Dust Mite Extract Other (See Comments)    Sneezing, watery eyes, runny nose   Latex Itching   Other Other (See Comments)    PT IS ALLERGIC TO CAT DANDER AND RAGWEED - Sneezing, watery eyes, runny nose    Pollen Extract Other (See Comments)    Sneezing, watery eyes, runny nose     Past Medical History:  Diagnosis Date   Abnormal Pap smear 2011   hpv/mild dysplasia,cin1   Anxiety    Cerebral palsy (HCC)    right arm/leg   Cystocele    Depression    Headache    Neuromuscular disorder (HCC)    Cerebral Palsy   OCD (obsessive compulsive disorder)    Osteoporosis    Uterine prolaps     Family History  Problem Relation Age of Onset   Cancer Father        skin AND LUNG   Alcohol abuse Sister  CRACK COCAINE    Social History   Socioeconomic History   Marital status: Married    Spouse name: Not on file   Number of children: Not on file   Years of education: Not on file   Highest education level: Not on file  Occupational History   Not on file  Tobacco Use   Smoking status: Never   Smokeless tobacco: Never  Substance and Sexual Activity   Alcohol use:  Not Currently    Comment: OCCASIONAL beer   Drug use: No   Sexual activity: Yes    Birth control/protection: Pill    Comment: LOESTRIN 24 FE  Other Topics Concern   Not on file  Social History Narrative   Not on file   Social Determinants of Health   Financial Resource Strain: Not on file  Food Insecurity: Not on file  Transportation Needs: Not on file  Physical Activity: Not on file  Stress: Not on file  Social Connections: Not on file  Intimate Partner Violence: Not on file    Past Medical History, Surgical history, Social history, and Family history were reviewed and updated as appropriate.   Please see review of systems for further details on the patient's review from today.   Objective:   Physical Exam:  LMP  (LMP Unknown)   Physical Exam Constitutional:      General: She is not in acute distress. Neurological:     Mental Status: She is alert and oriented to person, place, and time.     Cranial Nerves: No dysarthria.     Motor: Weakness present.     Coordination: Coordination normal.     Gait: Gait abnormal.  Psychiatric:        Attention and Perception: Attention and perception normal. She does not perceive auditory or visual hallucinations.        Mood and Affect: Mood is anxious and depressed. Affect is not labile, blunt or tearful.        Speech: Speech normal. Speech is not rapid and pressured or slurred.        Behavior: Behavior normal. Behavior is not slowed or withdrawn. Behavior is cooperative.        Thought Content: Thought content normal. Thought content is not delusional. Thought content does not include homicidal or suicidal ideation. Thought content does not include suicidal plan.        Cognition and Memory: Cognition and memory normal. Cognition is not impaired.        Judgment: Judgment normal.     Comments: Insight intact Ongoing OCD remains fairly severe Chronic depression persistent but maybe a little better     Lab Review:      Component Value Date/Time   NA 138 06/11/2021 0606   K 4.0 06/11/2021 0606   CL 107 06/11/2021 0606   CO2 26 06/11/2021 0606   GLUCOSE 90 06/11/2021 0606   BUN 18 06/11/2021 0606   CREATININE 0.81 06/11/2021 0606   CALCIUM 9.4 06/11/2021 0606   PROT 6.5 06/11/2021 0606   ALBUMIN 3.3 (L) 06/11/2021 0606   AST 17 06/11/2021 0606   ALT 14 06/11/2021 0606   ALKPHOS 141 (H) 06/11/2021 0606   BILITOT 0.2 (L) 06/11/2021 0606   GFRNONAA >60 06/11/2021 0606   GFRAA >60 07/09/2016 0438       Component Value Date/Time   WBC 5.8 06/11/2021 0606   RBC 4.12 06/11/2021 0606   HGB 12.5 06/11/2021 0606   HCT 39.7 06/11/2021 0606  PLT 299 06/11/2021 0606   MCV 96.4 06/11/2021 0606   MCH 30.3 06/11/2021 0606   MCHC 31.5 06/11/2021 0606   RDW 13.9 06/11/2021 0606   LYMPHSABS 1.9 06/11/2021 0606   MONOABS 0.5 06/11/2021 0606   EOSABS 0.1 06/11/2021 0606   BASOSABS 0.0 06/11/2021 0606    No results found for: POCLITH, LITHIUM   No results found for: PHENYTOIN, PHENOBARB, VALPROATE, CBMZ   .res Assessment: Plan:    Tarae was seen today for follow-up, depression and anxiety.  Diagnoses and all orders for this visit:  Recurrent major depression resistant to treatment [facility])  Mixed obsessional thoughts and acts  Social anxiety disorder  Insomnia due to mental condition    Both Dx are TR and marked.  Impaired.    Disc alternative for TR sx including Trintellix, Vraylar. Disc using the unusual combo of Trintellix and lower dose Fluvoxamine.   continue Luvox 300 mg daily.   Consider reevaluating dose Continue Wellbutrin XL to 300 mg daily.   Disc Spravato DT TRD incl details and SE. Disc dosing and duration.  Pt with severe depression MADRS 36 on9/13/22 Patient was administered Spravato 84 mg intranasally dosage today today.  The patient experienced the typical dissociation which gradually resolved over the 2-hour period of observation.  There were no complications.  Specifically  the patient did not have nausea or vomiting or headache.  Blood pressures remained within normal ranges at the 40-minute and 2-hour follow-up intervals.  By the time the 2-hour observation period was met the patient was alert and oriented and able to exit without assistance.  .  See nursing note for further details. Per protocol will continue Spravato to 84 mg next session.  Will plan twice weekly administrationfor a month.    We discussed the short-term risks associated with benzodiazepines including sedation and increased fall risk among others.  Discussed long-term side effect risk including dependence, potential withdrawal symptoms, and the potential eventual dose-related risk of dementia.  But recent studies from 2020 dispute this association between benzodiazepines and dementia risk. Newer studies in 2020 do not support an association with dementia.  Supportive therapy dealing with stressful family interactions lately leading to guilt and tears.  Also her cousin died unexpectedly recently.  Discussed grief and the difference between that and depression.  No med changes today.   Follow-up twice weekly   Lynder Parents, MD, DFAPA  Please see After Visit Summary for patient specific instructions.  Future Appointments  Date Time Provider [place]  11/08/2021  3:00 PM Cottle, Billey Co., MD [facility] None  11/08/2021  3:00 PM CP-NURSE [facility] None  11/10/2021 11:15 AM Wallene Huh, DPM [facility]  11/11/2021  3:00 PM Cottle, Billey Co., MD [facility] None  11/11/2021  3:00 PM CP-NURSE [facility] None  11/22/2021 11:00 AM Blanchie Serve, PhD [facility] None  12/06/2021 11:00 AM Blanchie Serve, PhD [facility] None  12/16/2021 11:30 AM Cottle, Billey Co., MD [facility] None  12/20/2021 11:00 AM Blanchie Serve, PhD [facility] None  01/03/2022 11:00 AM Blanchie Serve, PhD [facility] None    No orders of the defined types were placed in this encounter.    -------------------------------

## 2021-11-07 NOTE — Progress Notes (Signed)
Casey Diaz [id number] 07/01/1966 56 y.o.    Subjective:   Patient ID:  Casey Diaz is a 56 y.o. (DOB [birth year]) female.  Chief Complaint:  Chief Complaint  Patient presents with   Follow-up   Depression   Anxiety     HPI Casey Diaz presents to the office today for follow-up of OCD and severe anxiety.     December 2019 visit the following was noted: No meds were changed. Lives in [place] and back for followup.  Sx are about the same.  Has to take meds with different sizes. Pt reports that mood is Anxious and Depressed and describes anxiety as Severe. Anxiety symptoms include: Excessive Worry, Obsessive Compulsive Symptoms:   Checking,,. Pt reports has interrupted sleep and nocturia. Pt reports that appetite is good. Pt reports that energy is no change and down slightly. Concentration is down slightly. Suicidal thoughts:  denied by patient. Loves the environment of [place] but misses some things there.  She's not able to work there.  H works there and likes it.  Struggled with not working, feels isolated and not up to task of meeting people.  Does attend a church and met a friend who's been helpful.  Leaving for [place] on 10/16/18.   04/09/2020 appointment the following is noted:  Staying another year in [place] bc Covid and other things. Last few months a lot of crying spells.  Is in menopause. Wonders about med changes though is nervous about it.  Crying spells associated with depressing thoughts more than stress or OCD.   Covid really hard on everyone and couldn't see family for 18 mos.  Family still very dysfunctional. No close friends in part due to OCD and depression. Son high Autism spectrum with ADHD and anxiety and she's with him all the time. Greater health problems with CP so more pains.   05/15/20 appt with the following noted: Casey Diaz for menopause and helps some. Still depressed.  Chronically. In [place] for 2 more weeks then to [place] for another  year. A lot of stressors lately triggering more checking and anxiety.   OCD is her CC now and seems.  Got worse DT stress.   Stressed with Asberger's son and her health.  H works a lot.  Her FOO still stress. Plan: Trintellix 10 mg 1 tablet in the morning with food and reduce fluvoxamine to 5 tablets nightly for 1 week  then reduce it to 4 tablets nightly.   07/02/20 appt with the following noted: Decided not to get Trintellix bc difficulty getting it. It is available.  There.  Wants to start it now.   Both depression and OCD are severe.  Not suicidal in intent or plan. Did not take samples with her to [place] but will be back in December. covid is worse there and travel is difficult.  Wants to reduce Wellbutrin DT dry mouth. Plan: She's afraid to reduce Luvox at this time DT fear of worsening OCD.  But will consider. Trintellix 10 mg 1 tablet in the morning with food and reduce fluvoxamine to 5 tablets nightly for 1 week  then reduce it to 4 tablets nightly. Also reduce Wellbutrin XL to 300 mg daily.    9-13 2022 appointment with the following noted: Back in [place] since July 14.  Broke arm a month ago and surgery.  It's all been rough adjustment.   B has cancer on his face and M fell taking him to the doctor.  Misses the water  and weather of [place].   Cry a lot more since menopause. Still depression and anxiety and OCD.  Asks about ketamine. On Wellbutrin 300, Luvox 300.  No Trintellix. Added Ativan 2 mg AM and HS and it helps.  More likely to get upset at night. Plan: Increase Luvox back to 400 mg daily.  She thinks she's worse on less. Continue Wellbutrin XL to 300 mg daily. 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  She tolerated it well without unusual nausea or vomiting headache or other somatic symptoms.  She did have the expected dissociation which gradually resolved over the course  of the 2-hour period of observation.  She was a little concerned about her balance given her cerebral palsy but has not noted unusual or unexpected problems.  She is motivated to can continue Spravato in hopes of reducing her depressive symptoms. 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  She also has treatment resistant OCD which is partially managed with medications but is still quite disabling.  She is tolerating the medications well.  She is sleeping adequately.  Her appetite is adequate.  She is not having suicidal thoughts.  She continues to wish for a better treatment for OCD that would give her some relief.  09/30/2021 appointment with the following noted: She received her first dose of Spravato 84 mg intranasally today.  She tolerated it well without unusual nausea, vomiting, or other somatic symptoms.  Dissociation as expected did occur and gradually resolved over the 2-hour period of observation.  She did have a mild headache today with the treatment and received ibuprofen 600 mg at her request.  We will follow this to see if it is a pattern Patient is still depressed.  She said she was late with her medicine today and today was a particularly depressing day.  However she notes that the Spravato has lifted her mood considerably even today.  She is hopeful that it will continue to be helpful.  No suicidal thoughts.  She has ongoing chronic anxiety and OCD at baseline.  10/04/21 appt noted: Patient received Spravato 84 mg for the second time today.  She tolerated it well without any unusual headache, nausea or vomiting or other somatic symptoms.  Dissociation did occur and she gradually [place] resolution over the 2-hour period of observation. She did not have any unusual problems after she left the office last Spravato administration.  She did not have any specific problems with balance or walking.  She is at increased risk of that difficulty because of cerebral  palsy.  So far she has not noticed much mood effect from the medication beyond the first day of receiving it.  However she would like to continue Spravato in hopes of getting the antidepressant effect that is desired. Stress dealing with mother's behavior at party pt hosted.  Guilt over it.  10/07/2021 appointment noted: Patient received Spravato 84 mg for the second time today.  She tolerated it well without any unusual headache, nausea or vomiting or other somatic symptoms.  Dissociation did occur and she gradually [place] resolution over the 2-hour period of observation. She still is not sure about the antidepressant effect of Spravato.  Events over the holidays and demands, make it difficult to assess.  She still notes that the OCD tends to worsen the depression and vice versa.  She tolerates the Spravato well and  wants to continue the trial.  10/15/2021 appointment with the following noted: Patient received Spravato 84 mg for the second time today.  She tolerated it well without any unusual headache, nausea or vomiting or other somatic symptoms.  Dissociation did occur and she gradually [place] resolution over the 2-hour period of observation. Patient says it was somewhat difficult to evaluate the effect of the Spravato.  It was scheduled to be twice weekly for 4 weeks consecutively but the holidays have interfered with that administration.  She asked what specifically should be she should be looking for in order to assess improvement.  That was discussed.  The OCD is unchanged and the depression so far is not significantly different.  She still tolerates meds.  There have been no recent med changes  10/19/2021 appt noted: Patient received Spravato 84 mg for the second today.  She tolerated it well without any unusual headache, nausea or vomiting or other somatic symptoms.  Dissociation did occur and she gradually saw resolution over the 2-hour period of observation.   10/21/2021 appointment noted: Patient  received Spravato 84 mg for the second today.  She tolerated it well without any unusual headache, nausea or vomiting or other somatic symptoms.  Dissociation did occur and she gradually saw resolution over the 2-hour period of observation.  She feels better than last week.  She is not as depressed and down.  She is still dealing with grief around the death of her cousin that was unexpected.  It is still difficult to tell how much the Spravato was doing but she is hopeful.  Anxiety is still present with the OCD.  She is not having suicidal thoughts.  She is not hopeless.  She wants to continue treatment.  Previous psych med trials include Prozac, paroxetine, sertraline, fluvoxamine, venlafaxine, Anafranil with no response,  Wellbutrin, , Viibryd, Trintellix 10 1 month NR Geodon,  risperidone, Rexulti, Abilify,  Seroquel, Latuda 40 mg with irritability. lamotrigine lithium, BuSpar, Namenda,  pramipexole with no response, and Topamax, pindolol  ECT-MADRS    [facility] Office Visit from 06/29/2021 in [facility] Total Score 36      Flowsheet Row Admission (Discharged) from 06/11/2021 in [facility] No Risk        Review of Systems:  Review of Systems  Cardiovascular:  Negative for chest pain and palpitations.  Gastrointestinal:  Negative for nausea.  Musculoskeletal:  Positive for arthralgias, back pain, gait problem and joint swelling.  Neurological:  Positive for weakness. Negative for tremors and headaches.   Medications: I have reviewed the patient's current medications.  Current Outpatient Medications  Medication Sig Dispense Refill   Abaloparatide (TYMLOS) 3120 MCG/1.56ML SOPN Inject into the skin.     Azelastine-Fluticasone 137-50 MCG/ACT SUSP Place 1-2 sprays into both nostrils daily.     baclofen (LIORESAL) 10 MG tablet Take 20 mg by mouth at bedtime as needed for muscle spasms.     buPROPion (WELLBUTRIN XL) 300  MG 24 hr tablet Take 1 tablet BY MOUTH EVERY MORNING 30 tablet 1   dicyclomine (BENTYL) 10 MG capsule Take 10 mg by mouth daily.     docusate sodium (COLACE) 100 MG capsule Take 1 capsule (100 mg total) by mouth 2 (two) times daily. (Patient taking differently: Take 100 mg by mouth daily.) 10 capsule 0   Esketamine HCl, 84 MG Dose, (SPRAVATO, 84 MG DOSE,) 28 MG/DEVICE SOPK USE NASALLY AS DIRECTED ON PACKAGE LABEL TWICE A  WEEK 3 each 3   fexofenadine (ALLEGRA) 180 MG tablet Take 180 mg by mouth daily.     fluvoxaMINE (LUVOX) 100 MG tablet Take 3 tablets (300 mg total) by mouth at bedtime. 270 tablet 1   hydrocortisone (ANUSOL-HC) 2.5 % rectal cream Place rectally 2 (two) times daily. x 7-14 days 30 g 0   ketotifen (ZADITOR) 0.025 % ophthalmic solution Place 3 drops into both eyes at bedtime.     LORazepam (ATIVAN) 1 MG tablet Take 2 tablets (2 mg total) by mouth 2 (two) times daily. 120 tablet 2   magnesium gluconate (MAGONATE) 500 MG tablet Take 500 mg by mouth daily.     MIBELAS 24 FE 1-20 MG-MCG(24) CHEW Chew 1 tablet by mouth at bedtime as needed (bowel regularity).     Multiple Vitamins-Minerals (ADULT GUMMY PO) Take 2 tablets by mouth in the morning.     nitrofurantoin (MACRODANTIN) 100 MG capsule Take 100 mg by mouth as needed (For urinary tract infection.).      oxyCODONE-acetaminophen (PERCOCET/ROXICET) 5-325 MG tablet Take 1-2 tablets by mouth every 6 (six) hours as needed for severe pain. 50 tablet 0   polyethylene glycol (MIRALAX / GLYCOLAX) packet Take 17 g by mouth daily as needed for mild constipation. 14 each 0   psyllium (METAMUCIL) 58.6 % powder Take 1 packet by mouth daily as needed (constipation).     temazepam (RESTORIL) 30 MG capsule Take 1 capsule (30 mg total) by mouth at bedtime as needed for sleep. 30 capsule 5   Vitamin D-Vitamin K (VITAMIN K2-VITAMIN D3 PO) Take 1-2 sprays by mouth daily.     No current facility-administered medications for this visit.    Medication  Side Effects: None   Allergies:  Allergies  Allergen Reactions   Hydrocodone Itching   Sulfamethoxazole-Trimethoprim Itching   Dust Mite Extract Other (See Comments)    Sneezing, watery eyes, runny nose   Latex Itching   Other Other (See Comments)    PT IS ALLERGIC TO CAT DANDER AND RAGWEED - Sneezing, watery eyes, runny nose    Pollen Extract Other (See Comments)    Sneezing, watery eyes, runny nose     Past Medical History:  Diagnosis Date   Abnormal Pap smear 2011   hpv/mild dysplasia,cin1   Anxiety    Cerebral palsy (HCC)    right arm/leg   Cystocele    Depression    Headache    Neuromuscular disorder (HCC)    Cerebral Palsy   OCD (obsessive compulsive disorder)    Osteoporosis    Uterine prolaps     Family History  Problem Relation Age of Onset   Cancer Father        skin AND LUNG   Alcohol abuse Sister        CRACK COCAINE    Social History   Socioeconomic History   Marital status: Married    Spouse name: Not on file   Number of children: Not on file   Years of education: Not on file   Highest education level: Not on file  Occupational History   Not on file  Tobacco Use   Smoking status: Never   Smokeless tobacco: Never  Substance and Sexual Activity   Alcohol use: Not Currently    Comment: OCCASIONAL beer   Drug use: No   Sexual activity: Yes    Birth control/protection: Pill    Comment: LOESTRIN 24 FE  Other Topics Concern   Not on file  Social  History Narrative   Not on file   Social Determinants of Health   Financial Resource Strain: Not on file  Food Insecurity: Not on file  Transportation Needs: Not on file  Physical Activity: Not on file  Stress: Not on file  Social Connections: Not on file  Intimate Partner Violence: Not on file    Past Medical History, Surgical history, Social history, and Family history were reviewed and updated as appropriate.   Please see review of systems for further details on the patient's review  from today.   Objective:   Physical Exam:  LMP  (LMP Unknown)   Physical Exam Constitutional:      General: She is not in acute distress. Musculoskeletal:        General: No deformity.  Neurological:     Mental Status: She is alert and oriented to person, place, and time.     Cranial Nerves: No dysarthria.     Motor: Weakness present.     Coordination: Coordination normal.     Gait: Gait abnormal.  Psychiatric:        Attention and Perception: Attention and perception normal. She does not perceive auditory or visual hallucinations.        Mood and Affect: Mood is anxious and depressed. Affect is not labile, blunt or tearful.        Speech: Speech normal. Speech is not rapid and pressured or slurred.        Behavior: Behavior normal. Behavior is not slowed or withdrawn. Behavior is cooperative.        Thought Content: Thought content normal. Thought content is not delusional. Thought content does not include homicidal or suicidal ideation. Thought content does not include suicidal plan.        Cognition and Memory: Cognition and memory normal. Cognition is not impaired.        Judgment: Judgment normal.     Comments: Insight intact Ongoing OCD remains fairly severe Chronic depression persistent     Lab Review:     Component Value Date/Time   NA 138 06/11/2021 0606   K 4.0 06/11/2021 0606   CL 107 06/11/2021 0606   CO2 26 06/11/2021 0606   GLUCOSE 90 06/11/2021 0606   BUN 18 06/11/2021 0606   CREATININE 0.81 06/11/2021 0606   CALCIUM 9.4 06/11/2021 0606   PROT 6.5 06/11/2021 0606   ALBUMIN 3.3 (L) 06/11/2021 0606   AST 17 06/11/2021 0606   ALT 14 06/11/2021 0606   ALKPHOS 141 (H) 06/11/2021 0606   BILITOT 0.2 (L) 06/11/2021 0606   GFRNONAA >60 06/11/2021 0606   GFRAA >60 07/09/2016 0438       Component Value Date/Time   WBC 5.8 06/11/2021 0606   RBC 4.12 06/11/2021 0606   HGB 12.5 06/11/2021 0606   HCT 39.7 06/11/2021 0606   PLT 299 06/11/2021 0606   MCV 96.4  06/11/2021 0606   MCH 30.3 06/11/2021 0606   MCHC 31.5 06/11/2021 0606   RDW 13.9 06/11/2021 0606   LYMPHSABS 1.9 06/11/2021 0606   MONOABS 0.5 06/11/2021 0606   EOSABS 0.1 06/11/2021 0606   BASOSABS 0.0 06/11/2021 0606    No results found for: POCLITH, LITHIUM   No results found for: PHENYTOIN, PHENOBARB, VALPROATE, CBMZ   .res Assessment: Plan:    Grethel was seen today for follow-up, depression and anxiety.  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  Impaired.    Disc alternative for TR sx including Trintellix, Vraylar. Disc using the unusual combo of Trintellix and lower dose Fluvoxamine.   continue Luvox back to 400 mg daily.  She thinks she was worse on less. Continue Wellbutrin XL to 300 mg daily.   Disc Spravato DT TRD incl details and SE. Disc dosing and duration.  Pt with severe depression MADRS 36 on9/13/22 Patient was administered Spravato 84 mg intranasally dosage today today.  The patient experienced the typical dissociation which gradually resolved over the 2-hour period of observation.  There were no complications.  Specifically the patient did not have nausea or vomiting or headache.  Blood pressures remained within normal ranges at the 40-minute and 2-hour follow-up intervals.  By the time the 2-hour observation period was met the patient was alert and oriented and able to exit without assistance.  .  See nursing note for further details. Per protocol will continue Spravato to 84 mg next session.  Will plan twice weekly administrationfor a month.  Disc options in light of shortened week related to Christmas and New Year.  Disc effects of perimenopause on sx and risks of estrogen.    Ativan and luvox could contribute to the fatigue but these appear  necessary.   We discussed the short-term risks associated with benzodiazepines including sedation and increased fall risk among others.  Discussed long-term side effect risk including dependence, potential withdrawal symptoms, and the potential eventual dose-related risk of dementia.  But recent studies from 2020 dispute this association between benzodiazepines and dementia risk. Newer studies in 2020 do not support an association with dementia.  Supportive therapy dealing with stressful family interactions lately leading to guilt and tears.  Also her cousin died unexpectedly recently.  Discussed grief and the difference between that and depression.  No med changes today.   Follow-up twice weekly   Lynder Parents, MD, DFAPA  Please see After Visit Summary for patient specific instructions.  Future Appointments  Date Time Provider [place]  11/08/2021  3:00 PM Cottle, Billey Co., MD [facility] None  11/08/2021  3:00 PM CP-NURSE [facility] None  11/10/2021 11:15 AM Wallene Huh, DPM [facility]  11/11/2021  3:00 PM Cottle, Billey Co., MD CP-[facility] None  11/11/2021  3:00 PM CP-NURSE CP-[facility] None  11/22/2021 11:00 AM Blanchie Serve, PhD CP[facility] None  12/06/2021 11:00 AM Blanchie Serve, PhD [facility] None  12/16/2021 11:30 AM Cottle, Billey Co., MD CP-[facility] None  12/20/2021 11:00 AM Blanchie Serve, PhD [facility] None  01/03/2022 11:00 AM Blanchie Serve, PhD [facility] None    No orders of the defined types were placed in this encounter.    -------------------------------

## 2021-11-08 ENCOUNTER — Other Ambulatory Visit: Payer: Self-pay

## 2021-11-08 ENCOUNTER — Ambulatory Visit: Payer: 59

## 2021-11-08 ENCOUNTER — Encounter: Payer: Self-pay | Admitting: Psychiatry

## 2021-11-08 ENCOUNTER — Other Ambulatory Visit: Payer: Self-pay | Admitting: Psychiatry

## 2021-11-08 ENCOUNTER — Ambulatory Visit (INDEPENDENT_AMBULATORY_CARE_PROVIDER_SITE_OTHER): Payer: 59 | Admitting: Psychiatry

## 2021-11-08 VITALS — BP 117/76 | HR 85

## 2021-11-08 DIAGNOSIS — F422 Mixed obsessional thoughts and acts: Secondary | ICD-10-CM

## 2021-11-08 DIAGNOSIS — F4010 Social phobia, unspecified: Secondary | ICD-10-CM | POA: Diagnosis not present

## 2021-11-08 DIAGNOSIS — F339 Major depressive disorder, recurrent, unspecified: Secondary | ICD-10-CM

## 2021-11-08 DIAGNOSIS — F5105 Insomnia due to other mental disorder: Secondary | ICD-10-CM

## 2021-11-08 MED ORDER — FLUVOXAMINE MALEATE 100 MG PO TABS: 400.0000 mg | ORAL_TABLET | Freq: Every day | ORAL | 0 refills | Status: DC

## 2021-11-08 MED ORDER — BUPROPION HCL ER (XL) 150 MG PO TB24: 450.0000 mg | ORAL_TABLET | Freq: Every day | ORAL | 0 refills | Status: DC

## 2021-11-08 NOTE — Progress Notes (Signed)
Casey Diaz [id number] 07/01/1966 56 y.o.    Subjective:   Patient ID:  Casey Diaz is a 56 y.o. (DOB [birth year]) female.  Chief Complaint:  Chief Complaint  Patient presents with   Follow-up   Depression   Anxiety     HPI Casey Diaz presents to the office today for follow-up of OCD and severe anxiety.     December 2019 visit the following was noted: No meds were changed. Lives in [place] and back for followup.  Sx are about the same.  Has to take meds with different sizes. Pt reports that mood is Anxious and Depressed and describes anxiety as Severe. Anxiety symptoms include: Excessive Worry, Obsessive Compulsive Symptoms:   Checking,,. Pt reports has interrupted sleep and nocturia. Pt reports that appetite is good. Pt reports that energy is no change and down slightly. Concentration is down slightly. Suicidal thoughts:  denied by patient. Loves the environment of [place] but misses some things there.  She's not able to work there.  H works there and likes it.  Struggled with not working, feels isolated and not up to task of meeting people.  Does attend a church and met a friend who's been helpful.  Leaving for [place] on 10/16/18.   04/09/2020 appointment the following is noted:  Staying another year in [place] bc Covid and other things. Last few months a lot of crying spells.  Is in menopause. Wonders about med changes though is nervous about it.  Crying spells associated with depressing thoughts more than stress or OCD.   Covid really hard on everyone and couldn't see family for 18 mos.  Family still very dysfunctional. No close friends in part due to OCD and depression. Son high Autism spectrum with ADHD and anxiety and she's with him all the time. Greater health problems with CP so more pains.   05/15/20 appt with the following noted: [name] for menopause and helps some. Still depressed.  Chronically. In [place] for 2 more weeks then to [place] for another  year. A lot of stressors lately triggering more checking and anxiety.   OCD is her CC now and seems.  Got worse DT stress.   Stressed with Asberger's son and her health.  H works a lot.  Her FOO still stress. Plan: Trintellix 10 mg 1 tablet in the morning with food and reduce fluvoxamine to 5 tablets nightly for 1 week  then reduce it to 4 tablets nightly.   07/02/20 appt with the following noted: Decided not to get Trintellix bc difficulty getting it. It is available.  There.  Wants to start it now.   Both depression and OCD are severe.  Not suicidal in intent or plan. Did not take samples with her to [place] but will be back in December. covid is worse there and travel is difficult.  Wants to reduce Wellbutrin DT dry mouth. Plan: She's afraid to reduce Luvox at this time DT fear of worsening OCD.  But will consider. Trintellix 10 mg 1 tablet in the morning with food and reduce fluvoxamine to 5 tablets nightly for 1 week  then reduce it to 4 tablets nightly. Also reduce Wellbutrin XL to 300 mg daily.    9-13 2022 appointment with the following noted: Back in [place] since July 14.  Broke arm a month ago and surgery.  It's all been rough adjustment.   B has cancer on his face and M fell taking him to the doctor.  Misses the water  and weather of [place].   Cry a lot more since menopause. Still depression and anxiety and OCD.  Asks about ketamine. On Wellbutrin 300, Luvox 300.  No Trintellix. Added Ativan 2 mg AM and HS and it helps.  More likely to get upset at night. Plan: Increase Luvox back to 400 mg daily.  She thinks she's worse on less. Continue Wellbutrin XL to 300 mg daily. 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  She tolerated it well without unusual nausea or vomiting headache or other somatic symptoms.  She did have the expected dissociation which gradually resolved over the course  of the 2-hour period of observation.  She was a little concerned about her balance given her cerebral palsy but has not noted unusual or unexpected problems.  She is motivated to can continue Spravato in hopes of reducing her depressive symptoms. 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  She also has treatment resistant OCD which is partially managed with medications but is still quite disabling.  She is tolerating the medications well.  She is sleeping adequately.  Her appetite is adequate.  She is not having suicidal thoughts.  She continues to wish for a better treatment for OCD that would give her some relief.  09/30/2021 appointment with the following noted: She received her first dose of Spravato 84 mg intranasally today.  She tolerated it well without unusual nausea, vomiting, or other somatic symptoms.  Dissociation as expected did occur and gradually resolved over the 2-hour period of observation.  She did have a mild headache today with the treatment and received ibuprofen 600 mg at her request.  We will follow this to see if it is a pattern Patient is still depressed.  She said she was late with her medicine today and today was a particularly depressing day.  However she notes that the Spravato has lifted her mood considerably even today.  She is hopeful that it will continue to be helpful.  No suicidal thoughts.  She has ongoing chronic anxiety and OCD at baseline.  10/04/21 appt noted: Patient received Spravato 84 mg for the second time today.  She tolerated it well without any unusual headache, nausea or vomiting or other somatic symptoms.  Dissociation did occur and she gradually [place] resolution over the 2-hour period of observation. She did not have any unusual problems after she left the office last Spravato administration.  She did not have any specific problems with balance or walking.  She is at increased risk of that difficulty because of cerebral  palsy.  So far she has not noticed much mood effect from the medication beyond the first day of receiving it.  However she would like to continue Spravato in hopes of getting the antidepressant effect that is desired. Stress dealing with mother's behavior at party pt hosted.  Guilt over it.  10/07/2021 appointment noted: Patient received Spravato 84 mg for the second time today.  She tolerated it well without any unusual headache, nausea or vomiting or other somatic symptoms.  Dissociation did occur and she gradually [place] resolution over the 2-hour period of observation. She still is not sure about the antidepressant effect of Spravato.  Events over the holidays and demands, make it difficult to assess.  She still notes that the OCD tends to worsen the depression and vice versa.  She tolerates the Spravato well and  wants to continue the trial.  10/15/2021 appointment with the following noted: Patient received Spravato 84 mg for the second time today.  She tolerated it well without any unusual headache, nausea or vomiting or other somatic symptoms.  Dissociation did occur and she gradually [place] resolution over the 2-hour period of observation. Patient says it was somewhat difficult to evaluate the effect of the Spravato.  It was scheduled to be twice weekly for 4 weeks consecutively but the holidays have interfered with that administration.  She asked what specifically should be she should be looking for in order to assess improvement.  That was discussed.  The OCD is unchanged and the depression so far is not significantly different.  She still tolerates meds.  There have been no recent med changes  10/19/2021 appt noted: Patient received Spravato 84 mg for the second today.  She tolerated it well without any unusual headache, nausea or vomiting or other somatic symptoms.  Dissociation did occur and she gradually saw resolution over the 2-hour period of observation.   10/21/2021 appointment noted: Patient  received Spravato 84 mg today.  She tolerated it well without any unusual headache, nausea or vomiting or other somatic symptoms.  Dissociation did occur and she gradually saw resolution over the 2-hour period of observation.  She feels better than last week.  She is not as depressed and down.  She is still dealing with grief around the death of her cousin that was unexpected.  It is still difficult to tell how much the Spravato was doing but she is hopeful.  Anxiety is still present with the OCD.  She is not having suicidal thoughts.  She is not hopeless.  She wants to continue treatment.  10/25/2021 appointment with the following noted: Patient received Spravato 84 mg today.  She tolerated it well without any unusual headache, nausea or vomiting or other somatic symptoms.  Dissociation did occur and she gradually saw resolution over the 2-hour period of observation.  She does not typically find the dissociation very strong. She is beginning to think the Spravato is helping somewhat with the depression.  It has been difficult to tell with the holidays intervening as well as the death of her cousin.  She has not been able to get Spravato twice weekly for 4 weeks straight as typically planned.  However she is hopeful.  The OCD remains significant.  She still has a tendency to think very negatively.  She is not suicidal.  10/28/2021 appointment with the following noted: Patient received Spravato 84 mg today.  She tolerated it well without any unusual headache, nausea or vomiting or other somatic symptoms.  Dissociation did occur and she gradually saw resolution over the 2-hour period of observation.  She does not typically find the dissociation very strong. She is feeling more hopeful about the administration of Spravato.  She is having less depression she believes.  Still not dramatically different.  She still has a tendency to have a lot of anxiety and rumination and OCD.  She is not suicidal.  She is eager to  continue the Spravato.  11/01/2021 appointment with the following noted: Patient received Spravato 84 mg today.  She tolerated it well without any unusual headache, nausea or vomiting or other somatic symptoms.  Dissociation did occur and she gradually saw resolution over the 2-hour period of observation.  She does not typically find the dissociation very strong. She is continuing to see a little bit of improvement in depression with Spravato.  The anxiety remains but may be not as severe.  The OCD remains markedly severe chronically.  She is not suicidal.  She is encouraged by the degree of improvement with Spravato and inability to enjoy things more and not be quite as ruminative.  11/04/2021 appt noted: Patient received Spravato 84 mg today.  She tolerated it well without any unusual headache, nausea or vomiting or other somatic symptoms.  Dissociation did occur and she gradually saw resolution over the 2-hour period of observation.  She does not typically find the dissociation very strong. No SE complaints with meds. She continues to feel hopeful about the Spravato.  She has less depression.  Because of a number of factors she is uncertain of the full benefit but thinks she is somewhat less depressed.  Her anxiety and OCD remain significant but a little better.  She is tolerating the medications and does not desire medicine change.  She is not currently complaining of insomnia.   11/08/2021 appointment the following noted: Patient received Spravato 84 mg today.  She tolerated it well without any unusual headache, nausea or vomiting or other somatic symptoms.  Dissociation did occur and she gradually saw resolution over the 2-hour period of observation.  She does not typically find the dissociation very strong. No SE complaints with meds. She feels the Spravato is helping somewhat.  She would like to see a greater effect.  However she is able to enjoy things.  She is productive at home.  She would like  to see a lifting of a degree of sadness that remains.  The anxiety and OCD remained largely unchanged.  She wondered about the dosing of Wellbutrin 300 mg a day and Luvox 300 mg a day and possible increases.  She has been at higher doses in the past.  She plans to start water therapy for her weakness and for her shoulder.  Previous psych med trials include Prozac, paroxetine, sertraline, fluvoxamine, venlafaxine, Anafranil with no response,  Wellbutrin, , Viibryd, Trintellix 10 1 month NR Geodon,  risperidone, Rexulti, Abilify,  Seroquel, Latuda 40 mg with irritability. lamotrigine lithium, BuSpar, Namenda,  pramipexole with no response, and Topamax, pindolol  ECT-MADRS    [place] Office Visit from 06/29/2021 in [facility] Total Score 36      Flowsheet Row Admission (Discharged) from 06/11/2021 in [facility] No Risk        Review of Systems:  Review of Systems  Constitutional:  Positive for fatigue.  Musculoskeletal:  Positive for arthralgias, back pain, gait problem and joint swelling.  Neurological:  Positive for weakness. Negative for tremors.   Medications: I have reviewed the patient's current medications.  Current Outpatient Medications  Medication Sig Dispense Refill   Abaloparatide (TYMLOS) 3120 MCG/1.56ML SOPN Inject into the skin.     Azelastine-Fluticasone 137-50 MCG/ACT SUSP Place 1-2 sprays into both nostrils daily.     baclofen (LIORESAL) 10 MG tablet Take 20 mg by mouth at bedtime as needed for muscle spasms.     dicyclomine (BENTYL) 10 MG capsule Take 10 mg by mouth daily.     docusate sodium (COLACE) 100 MG capsule Take 1 capsule (100 mg total) by mouth 2 (two) times daily. (Patient taking differently: Take 100 mg by mouth daily.) 10 capsule 0   Esketamine HCl, 84 MG Dose, (SPRAVATO, 84 MG DOSE,) 28 MG/DEVICE SOPK USE NASALLY AS DIRECTED ON PACKAGE LABEL TWICE A WEEK 3 each 3   fexofenadine  (  ALLEGRA) 180 MG tablet Take 180 mg by mouth daily.     hydrocortisone (ANUSOL-HC) 2.5 % rectal cream Place rectally 2 (two) times daily. x 7-14 days 30 g 0   ketotifen (ZADITOR) 0.025 % ophthalmic solution Place 3 drops into both eyes at bedtime.     LORazepam (ATIVAN) 1 MG tablet Take 2 tablets (2 mg total) by mouth 2 (two) times daily. 120 tablet 2   magnesium gluconate (MAGONATE) 500 MG tablet Take 500 mg by mouth daily.     MIBELAS 24 FE 1-20 MG-MCG(24) CHEW Chew 1 tablet by mouth at bedtime as needed (bowel regularity).     Multiple Vitamins-Minerals (ADULT GUMMY PO) Take 2 tablets by mouth in the morning.     nitrofurantoin (MACRODANTIN) 100 MG capsule Take 100 mg by mouth as needed (For urinary tract infection.).      oxyCODONE-acetaminophen (PERCOCET/ROXICET) 5-325 MG tablet Take 1-2 tablets by mouth every 6 (six) hours as needed for severe pain. 50 tablet 0   polyethylene glycol (MIRALAX / GLYCOLAX) packet Take 17 g by mouth daily as needed for mild constipation. 14 each 0   psyllium (METAMUCIL) 58.6 % powder Take 1 packet by mouth daily as needed (constipation).     temazepam (RESTORIL) 30 MG capsule Take 1 capsule (30 mg total) by mouth at bedtime as needed for sleep. 30 capsule 5   Vitamin D-Vitamin K (VITAMIN K2-VITAMIN D3 PO) Take 1-2 sprays by mouth daily.     buPROPion (WELLBUTRIN XL) 150 MG 24 hr tablet Take 3 tablets (450 mg total) by mouth daily. 270 tablet 0   fluvoxaMINE (LUVOX) 100 MG tablet Take 4 tablets (400 mg total) by mouth at bedtime. 360 tablet 0   No current facility-administered medications for this visit.    Medication Side Effects: None   Allergies:  Allergies  Allergen Reactions   Hydrocodone Itching   Sulfamethoxazole-Trimethoprim Itching   Dust Mite Extract Other (See Comments)    Sneezing, watery eyes, runny nose   Latex Itching   Other Other (See Comments)    PT IS ALLERGIC TO CAT DANDER AND RAGWEED - Sneezing, watery eyes, runny nose    Pollen  Extract Other (See Comments)    Sneezing, watery eyes, runny nose     Past Medical History:  Diagnosis Date   Abnormal Pap smear 2011   hpv/mild dysplasia,cin1   Anxiety    Cerebral palsy (HCC)    right arm/leg   Cystocele    Depression    Headache    Neuromuscular disorder (HCC)    Cerebral Palsy   OCD (obsessive compulsive disorder)    Osteoporosis    Uterine prolaps     Family History  Problem Relation Age of Onset   Cancer Father        skin AND LUNG   Alcohol abuse Sister        CRACK COCAINE    Social History   Socioeconomic History   Marital status: Married    Spouse name: Not on file   Number of children: Not on file   Years of education: Not on file   Highest education level: Not on file  Occupational History   Not on file  Tobacco Use   Smoking status: Never   Smokeless tobacco: Never  Substance and Sexual Activity   Alcohol use: Not Currently    Comment: OCCASIONAL beer   Drug use: No   Sexual activity: Yes    Birth control/protection: Pill  Comment: LOESTRIN 24 FE  Other Topics Concern   Not on file  Social History Narrative   Not on file   Social Determinants of Health   Financial Resource Strain: Not on file  Food Insecurity: Not on file  Transportation Needs: Not on file  Physical Activity: Not on file  Stress: Not on file  Social Connections: Not on file  Intimate Partner Violence: Not on file    Past Medical History, Surgical history, Social history, and Family history were reviewed and updated as appropriate.   Please see review of systems for further details on the patient's review from today.   Objective:   Physical Exam:  LMP  (LMP Unknown)   Physical Exam Constitutional:      General: She is not in acute distress. Neurological:     Mental Status: She is alert and oriented to person, place, and time.     Cranial Nerves: No dysarthria.     Motor: Weakness present.     Gait: Gait abnormal.  Psychiatric:         Attention and Perception: Attention and perception normal. She does not perceive auditory or visual hallucinations.        Mood and Affect: Mood is anxious and depressed. Affect is not labile, blunt or tearful.        Speech: Speech normal. Speech is not rapid and pressured or slurred.        Behavior: Behavior normal. Behavior is not slowed. Behavior is cooperative.        Thought Content: Thought content normal. Thought content is not delusional. Thought content does not include homicidal or suicidal ideation. Thought content does not include suicidal plan.        Cognition and Memory: Cognition and memory normal. Cognition is not impaired.        Judgment: Judgment normal.     Comments: Insight intact Ongoing OCD remains fairly severe but less anxious Chronic depression persistent but better     Lab Review:     Component Value Date/Time   NA 138 06/11/2021 0606   K 4.0 06/11/2021 0606   CL 107 06/11/2021 0606   CO2 26 06/11/2021 0606   GLUCOSE 90 06/11/2021 0606   BUN 18 06/11/2021 0606   CREATININE 0.81 06/11/2021 0606   CALCIUM 9.4 06/11/2021 0606   PROT 6.5 06/11/2021 0606   ALBUMIN 3.3 (L) 06/11/2021 0606   AST 17 06/11/2021 0606   ALT 14 06/11/2021 0606   ALKPHOS 141 (H) 06/11/2021 0606   BILITOT 0.2 (L) 06/11/2021 0606   GFRNONAA >60 06/11/2021 0606   GFRAA >60 07/09/2016 0438       Component Value Date/Time   WBC 5.8 06/11/2021 0606   RBC 4.12 06/11/2021 0606   HGB 12.5 06/11/2021 0606   HCT 39.7 06/11/2021 0606   PLT 299 06/11/2021 0606   MCV 96.4 06/11/2021 0606   MCH 30.3 06/11/2021 0606   MCHC 31.5 06/11/2021 0606   RDW 13.9 06/11/2021 0606   LYMPHSABS 1.9 06/11/2021 0606   MONOABS 0.5 06/11/2021 0606   EOSABS 0.1 06/11/2021 0606   BASOSABS 0.0 06/11/2021 0606    No results found for: POCLITH, LITHIUM   No results found for: PHENYTOIN, PHENOBARB, VALPROATE, CBMZ   .res Assessment: Plan:    [place] was seen today for follow-up, depression and  anxiety.  Diagnoses and all orders for this visit:  Recurrent major depression resistant to treatment ([place]) -     buPROPion (WELLBUTRIN XL) 150 MG  24 hr tablet; Take 3 tablets (450 mg total) by mouth daily. -     fluvoxaMINE (LUVOX) 100 MG tablet; Take 4 tablets (400 mg total) by mouth at bedtime.  Mixed obsessional thoughts and acts -     fluvoxaMINE (LUVOX) 100 MG tablet; Take 4 tablets (400 mg total) by mouth at bedtime.  Social anxiety disorder -     fluvoxaMINE (LUVOX) 100 MG tablet; Take 4 tablets (400 mg total) by mouth at bedtime.  Insomnia due to mental condition    Both Dx are TR and marked.  Impaired.  She is receiving Spravato 84 mg twice weekly and so far is seeing a bit of improvement.    The OCD remains severe and ongoing and it is difficult to tell the effect of Spravato so far on OCD.  She has been on higher doses of fluvoxamine above the usual max of 400 mg daily in the past.  This became difficult to obtain at 1 point and the dose was reduced to 300 mg daily.  She would like to try the 400 mg daily again to see if her OCD can be better controlled  She is also been on higher doses of Wellbutrin in the past at 450 mg daily she would like to try increasing that to maximize benefit for depression.  She is tolerating the meds well  Disc alternative for TR sx including Trintellix, Vraylar. Disc using the unusual combo of Trintellix and lower dose Fluvoxamine.   Increase Luvox back to 400 mg nightly. Consider reevaluating dose.  Disc dosing Increase Wellbutrin XL back to 450 mg every morning for depression  Disc Spravato DT TRD incl details and SE. Disc dosing and duration.  Pt with severe depression MADRS 36 on9/13/22 Patient was administered Spravato 84 mg intranasally dosage today today.  The patient experienced the typical dissociation which gradually resolved over the 2-hour period of observation.  There were no complications.  Specifically the patient did not have  nausea or vomiting or headache.  Blood pressures remained within normal ranges at the 40-minute and 2-hour follow-up intervals.  By the time the 2-hour observation period was met the patient was alert and oriented and able to exit without assistance. She tends to have lingering sedative effects but not severe. .  See nursing note for further details. Per protocol will continue Spravato to 84 mg next session.  Will plan twice weekly administrationfor a month.    We discussed the short-term risks associated with benzodiazepines including sedation and increased fall risk among others.  Discussed long-term side effect risk including dependence, potential withdrawal symptoms, and the potential eventual dose-related risk of dementia.  But recent studies from 2020 dispute this association between benzodiazepines and dementia risk. Newer studies in 2020 do not support an association with dementia.  Supportive therapy dealing with stressful family interactions lately leading to guilt and tears.  Also her cousin died unexpectedly recently.  Discussed grief and the difference between that and depression.  No med changes today.   Follow-up twice weekly another week bc uncertain she has received full benefit of twice weekly.   Lynder Parents, MD, DFAPA  Please see After Visit Summary for patient specific instructions.  Future Appointments  Date Time Provider [place]  11/10/2021 11:15 AM Wallene Huh, [place] [facility]  11/11/2021  3:00 PM Cottle, Billey Co., MD [facility] None  11/11/2021  3:00 PM CP-NURSE [facility] None  11/22/2021 11:00 AM Blanchie Serve, PhD [facility] None  12/06/2021 11:00  AM Blanchie Serve, PhD [facility] None  12/16/2021 11:30 AM Cottle, Billey Co., MD [facility] None  12/20/2021 11:00 AM Blanchie Serve, PhD [facility] None  01/03/2022 11:00 AM Blanchie Serve, PhD [facility] None    No orders of the defined types were placed in this encounter.    -------------------------------

## 2021-11-08 NOTE — Progress Notes (Signed)
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 B/P at 3:15 PM 124/50, 105. Pt instructed to blow her nose and to recline back at 45 degrees. Pt given first nasal spray (28 mg) administered by pt observed by nurse. There were 5 minutes between each dose, total of 84 mg. Tolerated well. Pt's medication is delivered by [facility] in [place] and stored inside a safe behind a locked door as well. Spravato is a CIII medication and has to be only given at a treatment facility and never given to patient.  Pt relaxes in recliner, no complaints voiced. No sedation noted. Pt does experience some dissociation but does clear up prior to discharge. Pt's 40 minute vital signs at 3:55 PM 133/89 98. [name] comes to discuss her care at the end of her treatment when her thoughts are clearer. No complaints of a headache today after her treatment. Discharge vitals at 5:15 PM 112/80, 97. Pt stable for discharge. Next treatment is Monday, January 23th at 3:00 PM. Pt was observed on site a total of 120 minutes per FDA/REMS requirements. Pt was with nurse for clinical assessment a total of 55 minutes.    LOT [id number] EXP [id number]

## 2021-11-09 ENCOUNTER — Ambulatory Visit: Payer: Self-pay | Admitting: Psychiatry

## 2021-11-09 ENCOUNTER — Other Ambulatory Visit: Payer: Self-pay | Admitting: Psychiatry

## 2021-11-09 NOTE — Telephone Encounter (Signed)
Her dose has increased from 300 mg to 450 mg daily. I will submit a PA for 3 of the 150 mg.

## 2021-11-10 ENCOUNTER — Encounter: Payer: Self-pay | Admitting: Podiatry

## 2021-11-10 ENCOUNTER — Other Ambulatory Visit: Payer: Self-pay

## 2021-11-10 ENCOUNTER — Ambulatory Visit (INDEPENDENT_AMBULATORY_CARE_PROVIDER_SITE_OTHER): Payer: 59

## 2021-11-10 ENCOUNTER — Ambulatory Visit (INDEPENDENT_AMBULATORY_CARE_PROVIDER_SITE_OTHER): Payer: 59 | Admitting: Podiatry

## 2021-11-10 DIAGNOSIS — G809 Cerebral palsy, unspecified: Secondary | ICD-10-CM

## 2021-11-10 DIAGNOSIS — M7751 Other enthesopathy of right foot: Secondary | ICD-10-CM | POA: Diagnosis not present

## 2021-11-10 DIAGNOSIS — S99921A Unspecified injury of right foot, initial encounter: Secondary | ICD-10-CM | POA: Diagnosis not present

## 2021-11-10 DIAGNOSIS — R2681 Unsteadiness on feet: Secondary | ICD-10-CM

## 2021-11-10 MED ORDER — TRIAMCINOLONE ACETONIDE 10 MG/ML IJ SUSP
10.0000 mg | Freq: Once | INTRAMUSCULAR | Status: AC
  Administered 2021-11-10: 12:00:00 10 mg

## 2021-11-10 NOTE — Progress Notes (Signed)
Subjective:   Patient ID: Casey Diaz, female   DOB: 56 y.o.   MRN: [id number]   HPI Patient presents stating she has had some gait instability due to her cerebral palsy and brace that is out of commission and no longer adequately supporting her right lower leg and has developed pain in the ankle secondary to several sprains and has trouble walking.  Does not smoke likes to be active   Review of Systems  All other systems reviewed and are negative.      Objective:  Physical Exam Vitals and nursing note reviewed.  Constitutional:      Appearance: She is well-developed.  Pulmonary:     Effort: Pulmonary effort is normal.  Musculoskeletal:        General: Normal range of motion.  Skin:    General: Skin is warm.  Neurological:     Mental Status: She is alert.    Neurovascular status intact muscle strength adequate range of motion within normal limits with patient having history of cerebral palsy does have some fixed equinus will inversion of the right foot and ankle with inflammation pain of the sinus tarsi.  Patient is found to have good digital perfusion well oriented x3     Assessment:  Inflammatory capsulitis of the sinus tarsi right with inflammation fluid buildup     Plan:  H&P reviewed condition sterile prep and injected the sinus tarsi right 3 mg Kenalog 5 mg Xylocaine discussed bracing and she has had previous AFO bracing from years ago which is no longer fitting her properly and I recommended new brace and I am referring to ped orthotist to have these made.  Patient wants to have this done and is scheduled with ped orthotist for this procedure and is encouraged to call if any symptoms were to persist  X-rays indicate there is some stress on the ankle joint right but I did not seems signs of fracture or diastases center

## 2021-11-11 ENCOUNTER — Ambulatory Visit: Payer: 59

## 2021-11-11 ENCOUNTER — Ambulatory Visit (INDEPENDENT_AMBULATORY_CARE_PROVIDER_SITE_OTHER): Payer: 59 | Admitting: Psychiatry

## 2021-11-11 VITALS — BP 134/88 | HR 83

## 2021-11-11 DIAGNOSIS — F4010 Social phobia, unspecified: Secondary | ICD-10-CM | POA: Diagnosis not present

## 2021-11-11 DIAGNOSIS — F339 Major depressive disorder, recurrent, unspecified: Secondary | ICD-10-CM | POA: Diagnosis not present

## 2021-11-11 DIAGNOSIS — F5105 Insomnia due to other mental disorder: Secondary | ICD-10-CM | POA: Diagnosis not present

## 2021-11-11 DIAGNOSIS — F422 Mixed obsessional thoughts and acts: Secondary | ICD-10-CM

## 2021-11-11 NOTE — Progress Notes (Signed)
Casey Diaz [id number] 07/01/1966 56 y.o.    Subjective:   Patient ID:  Casey Diaz is a 56 y.o. (DOB [birth year]) female.  Chief Complaint:  Chief Complaint  Patient presents with   Follow-up   Depression   Anxiety     HPI [name] presents to the office today for follow-up of OCD and severe anxiety.     December 2019 visit the following was noted: No meds were changed. Lives in [place] and back for followup.  Sx are about the same.  Has to take meds with different sizes. Pt reports that mood is Anxious and Depressed and describes anxiety as Severe. Anxiety symptoms include: Excessive Worry, Obsessive Compulsive Symptoms:   Checking,,. Pt reports has interrupted sleep and nocturia. Pt reports that appetite is good. Pt reports that energy is no change and down slightly. Concentration is down slightly. Suicidal thoughts:  denied by patient. Loves the environment of [place] but misses some things there.  She's not able to work there.  H works there and likes it.  Struggled with not working, feels isolated and not up to task of meeting people.  Does attend a church and met a friend who's been helpful.  Leaving for [place] on 10/16/18.   04/09/2020 appointment the following is noted:  Staying another year in [place] bc Covid and other things. Last few months a lot of crying spells.  Is in menopause. Wonders about med changes though is nervous about it.  Crying spells associated with depressing thoughts more than stress or OCD.   Covid really hard on everyone and couldn't see family for 18 mos.  Family still very dysfunctional. No close friends in part due to OCD and depression. Son high Autism spectrum with ADHD and anxiety and she's with him all the time. Greater health problems with CP so more pains.   05/15/20 appt with the following noted: [name] for menopause and helps some. Still depressed.  Chronically. In [place] for 2 more weeks then to [place] for another  year. A lot of stressors lately triggering more checking and anxiety.   OCD is her CC now and seems.  Got worse DT stress.   Stressed with Asberger's son and her health.  H works a lot.  Her FOO still stress. Plan: Trintellix 10 mg 1 tablet in the morning with food and reduce fluvoxamine to 5 tablets nightly for 1 week  then reduce it to 4 tablets nightly.   07/02/20 appt with the following noted: Decided not to get Trintellix bc difficulty getting it. It is available.  There.  Wants to start it now.   Both depression and OCD are severe.  Not suicidal in intent or plan. Did not take samples with her to [place] but will be back in December. covid is worse there and travel is difficult.  Wants to reduce Wellbutrin DT dry mouth. Plan: She's afraid to reduce Luvox at this time DT fear of worsening OCD.  But will consider. Trintellix 10 mg 1 tablet in the morning with food and reduce fluvoxamine to 5 tablets nightly for 1 week  then reduce it to 4 tablets nightly. Also reduce Wellbutrin XL to 300 mg daily.    9-13 2022 appointment with the following noted: Back in [place] since July 14.  Broke arm a month ago and surgery.  It's all been rough adjustment.   B has cancer on his face and M fell taking him to the doctor.  Misses the water  and weather of [place].   Cry a lot more since menopause. Still depression and anxiety and OCD.  Asks about ketamine. On Wellbutrin 300, Luvox 300.  No Trintellix. Added Ativan 2 mg AM and HS and it helps.  More likely to get upset at night. Plan: Increase Luvox back to 400 mg daily.  She thinks she's worse on less. Continue Wellbutrin XL to 300 mg daily. 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  She tolerated it well without unusual nausea or vomiting headache or other somatic symptoms.  She did have the expected dissociation which gradually resolved over the course  of the 2-hour period of observation.  She was a little concerned about her balance given her cerebral palsy but has not noted unusual or unexpected problems.  She is motivated to can continue Spravato in hopes of reducing her depressive symptoms. 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  She also has treatment resistant OCD which is partially managed with medications but is still quite disabling.  She is tolerating the medications well.  She is sleeping adequately.  Her appetite is adequate.  She is not having suicidal thoughts.  She continues to wish for a better treatment for OCD that would give her some relief.  09/30/2021 appointment with the following noted: She received her first dose of Spravato 84 mg intranasally today.  She tolerated it well without unusual nausea, vomiting, or other somatic symptoms.  Dissociation as expected did occur and gradually resolved over the 2-hour period of observation.  She did have a mild headache today with the treatment and received ibuprofen 600 mg at her request.  We will follow this to see if it is a pattern Patient is still depressed.  She said she was late with her medicine today and today was a particularly depressing day.  However she notes that the Spravato has lifted her mood considerably even today.  She is hopeful that it will continue to be helpful.  No suicidal thoughts.  She has ongoing chronic anxiety and OCD at baseline.  10/04/21 appt noted: Patient received Spravato 84 mg for the second time today.  She tolerated it well without any unusual headache, nausea or vomiting or other somatic symptoms.  Dissociation did occur and she gradually [place] resolution over the 2-hour period of observation. She did not have any unusual problems after she left the office last Spravato administration.  She did not have any specific problems with balance or walking.  She is at increased risk of that difficulty because of cerebral  palsy.  So far she has not noticed much mood effect from the medication beyond the first day of receiving it.  However she would like to continue Spravato in hopes of getting the antidepressant effect that is desired. Stress dealing with mother's behavior at party pt hosted.  Guilt over it.  10/07/2021 appointment noted: Patient received Spravato 84 mg for the second time today.  She tolerated it well without any unusual headache, nausea or vomiting or other somatic symptoms.  Dissociation did occur and she gradually [place] resolution over the 2-hour period of observation. She still is not sure about the antidepressant effect of Spravato.  Events over the holidays and demands, make it difficult to assess.  She still notes that the OCD tends to worsen the depression and vice versa.  She tolerates the Spravato well and  wants to continue the trial.  10/15/2021 appointment with the following noted: Patient received Spravato 84 mg for the second time today.  She tolerated it well without any unusual headache, nausea or vomiting or other somatic symptoms.  Dissociation did occur and she gradually [place] resolution over the 2-hour period of observation. Patient says it was somewhat difficult to evaluate the effect of the Spravato.  It was scheduled to be twice weekly for 4 weeks consecutively but the holidays have interfered with that administration.  She asked what specifically should be she should be looking for in order to assess improvement.  That was discussed.  The OCD is unchanged and the depression so far is not significantly different.  She still tolerates meds.  There have been no recent med changes  10/19/2021 appt noted: Patient received Spravato 84 mg for the second today.  She tolerated it well without any unusual headache, nausea or vomiting or other somatic symptoms.  Dissociation did occur and she gradually saw resolution over the 2-hour period of observation.   10/21/2021 appointment noted: Patient  received Spravato 84 mg today.  She tolerated it well without any unusual headache, nausea or vomiting or other somatic symptoms.  Dissociation did occur and she gradually saw resolution over the 2-hour period of observation.  She feels better than last week.  She is not as depressed and down.  She is still dealing with grief around the death of her cousin that was unexpected.  It is still difficult to tell how much the Spravato was doing but she is hopeful.  Anxiety is still present with the OCD.  She is not having suicidal thoughts.  She is not hopeless.  She wants to continue treatment.  10/25/2021 appointment with the following noted: Patient received Spravato 84 mg today.  She tolerated it well without any unusual headache, nausea or vomiting or other somatic symptoms.  Dissociation did occur and she gradually saw resolution over the 2-hour period of observation.  She does not typically find the dissociation very strong. She is beginning to think the Spravato is helping somewhat with the depression.  It has been difficult to tell with the holidays intervening as well as the death of her cousin.  She has not been able to get Spravato twice weekly for 4 weeks straight as typically planned.  However she is hopeful.  The OCD remains significant.  She still has a tendency to think very negatively.  She is not suicidal.  10/28/2021 appointment with the following noted: Patient received Spravato 84 mg today.  She tolerated it well without any unusual headache, nausea or vomiting or other somatic symptoms.  Dissociation did occur and she gradually saw resolution over the 2-hour period of observation.  She does not typically find the dissociation very strong. She is feeling more hopeful about the administration of Spravato.  She is having less depression she believes.  Still not dramatically different.  She still has a tendency to have a lot of anxiety and rumination and OCD.  She is not suicidal.  She is eager to  continue the Spravato.  11/01/2021 appointment with the following noted: Patient received Spravato 84 mg today.  She tolerated it well without any unusual headache, nausea or vomiting or other somatic symptoms.  Dissociation did occur and she gradually saw resolution over the 2-hour period of observation.  She does not typically find the dissociation very strong. She is continuing to see a little bit of improvement in depression with Spravato.  The anxiety remains but may be not as severe.  The OCD remains markedly severe chronically.  She is not suicidal.  She is encouraged by the degree of improvement with Spravato and inability to enjoy things more and not be quite as ruminative.  11/04/2021 appt noted: Patient received Spravato 84 mg today.  She tolerated it well without any unusual headache, nausea or vomiting or other somatic symptoms.  Dissociation did occur and she gradually saw resolution over the 2-hour period of observation.  She does not typically find the dissociation very strong. No SE complaints with meds. She continues to feel hopeful about the Spravato.  She has less depression.  Because of a number of factors she is uncertain of the full benefit but thinks she is somewhat less depressed.  Her anxiety and OCD remain significant but a little better.  She is tolerating the medications and does not desire medicine change.  She is not currently complaining of insomnia.   11/08/2021 appointment the following noted: Patient received Spravato 84 mg today.  She tolerated it well without any unusual headache, nausea or vomiting or other somatic symptoms.  Dissociation did occur and she gradually saw resolution over the 2-hour period of observation.  She does not typically find the dissociation very strong. No SE complaints with meds. She feels the Spravato is helping somewhat.  She would like to see a greater effect.  However she is able to enjoy things.  She is productive at home.  She would like  to see a lifting of a degree of sadness that remains.  The anxiety and OCD remained largely unchanged.  She wondered about the dosing of Wellbutrin 300 mg a day and Luvox 300 mg a day and possible increases.  She has been at higher doses in the past.  She plans to start water therapy for her weakness and for her shoulder.  11/11/2021 appointment with the following noted: Patient received Spravato 84 mg today.  She tolerated it well without any unusual headache, nausea or vomiting or other somatic symptoms.  Dissociation did occur and she gradually saw resolution over the 2-hour period of observation.  She does not typically find the dissociation very strong. No SE complaints with meds. She feels the Spravato is clearly helping the depression.  She would like to see a more significant effect.  She is still having trouble thinking positive. Her energy is fair.  Concentration is good except for the problem with chronic obsessions. She has been taking Wellbutrin 300 mg in Luvox 300 mg for quite some time but has taken higher doses in the past.  We discussed that.  She would like to try higher doses in order to get a better effect if possible. We just increased the doses a couple of days ago.  No effect yet.  Previous psych med trials include Prozac, paroxetine, sertraline, fluvoxamine, venlafaxine, Anafranil with no response,  Wellbutrin, , Viibryd, Trintellix 10 1 month NR Geodon,  risperidone, Rexulti, Abilify,  Seroquel, Latuda 40 mg with irritability. lamotrigine lithium, BuSpar, Namenda,  pramipexole with no response, and Topamax, pindolol  ECT-MADRS    [facility] Visit from 06/29/2021 in [facility] Total Score 36      Flowsheet Row Admission (Discharged) from 06/11/2021 in [facility] No Risk        Review of Systems:  Review of Systems  Constitutional:  Positive for fatigue.  Cardiovascular:  Negative for  palpitations.  Musculoskeletal:  Positive for arthralgias, back pain, gait problem and joint swelling.  Neurological:  Positive for weakness. Negative for tremors.   Medications: I have reviewed the patient's current medications.  Current Outpatient Medications  Medication Sig Dispense Refill   Abaloparatide (TYMLOS) 3120 MCG/1.56ML SOPN Inject into the skin.     Azelastine-Fluticasone 137-50 MCG/ACT SUSP Place 1-2 sprays into both nostrils daily.     baclofen (LIORESAL) 10 MG tablet Take 20 mg by mouth at bedtime as needed for muscle spasms.     buPROPion (WELLBUTRIN XL) 150 MG 24 hr tablet Take 3 tablets (450 mg total) by mouth daily. 270 tablet 0   dicyclomine (BENTYL) 10 MG capsule Take 10 mg by mouth daily.     docusate sodium (COLACE) 100 MG capsule Take 1 capsule (100 mg total) by mouth 2 (two) times daily. (Patient taking differently: Take 100 mg by mouth daily.) 10 capsule 0   fexofenadine (ALLEGRA) 180 MG tablet Take 180 mg by mouth daily.     fluvoxaMINE (LUVOX) 100 MG tablet Take 4 tablets (400 mg total) by mouth at bedtime. 360 tablet 0   hydrocortisone (ANUSOL-HC) 2.5 % rectal cream Place rectally 2 (two) times daily. x 7-14 days 30 g 0   ketotifen (ZADITOR) 0.025 % ophthalmic solution Place 3 drops into both eyes at bedtime.     LORazepam (ATIVAN) 1 MG tablet Take 2 tablets (2 mg total) by mouth 2 (two) times daily. 120 tablet 2   magnesium gluconate (MAGONATE) 500 MG tablet Take 500 mg by mouth daily.     MIBELAS 24 FE 1-20 MG-MCG(24) CHEW Chew 1 tablet by mouth at bedtime as needed (bowel regularity).     Multiple Vitamins-Minerals (ADULT GUMMY PO) Take 2 tablets by mouth in the morning.     nitrofurantoin (MACRODANTIN) 100 MG capsule Take 100 mg by mouth as needed (For urinary tract infection.).      oxyCODONE-acetaminophen (PERCOCET/ROXICET) 5-325 MG tablet Take 1-2 tablets by mouth every 6 (six) hours as needed for severe pain. 50 tablet 0   polyethylene glycol (MIRALAX /  GLYCOLAX) packet Take 17 g by mouth daily as needed for mild constipation. 14 each 0   psyllium (METAMUCIL) 58.6 % powder Take 1 packet by mouth daily as needed (constipation).     SPRAVATO, 84 MG DOSE, 28 MG/DEVICE SOPK USE NASALLY AS DIRECTED ON PACKAGE LABEL TWICE A WEEK 3 each 3   temazepam (RESTORIL) 30 MG capsule Take 1 capsule (30 mg total) by mouth at bedtime as needed for sleep. 30 capsule 5   Vitamin D-Vitamin K (VITAMIN K2-VITAMIN D3 PO) Take 1-2 sprays by mouth daily.     No current facility-administered medications for this visit.    Medication Side Effects: None   Allergies:  Allergies  Allergen Reactions   Hydrocodone Itching   Sulfamethoxazole-Trimethoprim Itching   Dust Mite Extract Other (See Comments)    Sneezing, watery eyes, runny nose   Latex Itching   Other Other (See Comments)    PT IS ALLERGIC TO CAT DANDER AND RAGWEED - Sneezing, watery eyes, runny nose    Pollen Extract Other (See Comments)    Sneezing, watery eyes, runny nose     Past Medical History:  Diagnosis Date   Abnormal Pap smear 2011   hpv/mild dysplasia,cin1   Anxiety    Cerebral palsy ([place])    right arm/leg   Cystocele    Depression    Headache    Neuromuscular disorder ([place])  Cerebral Palsy   OCD (obsessive compulsive disorder)    Osteoporosis    Uterine prolaps     Family History  Problem Relation Age of Onset   Cancer Father        skin AND LUNG   Alcohol abuse Sister        CRACK COCAINE    Social History   Socioeconomic History   Marital status: Married    Spouse name: Not on file   Number of children: Not on file   Years of education: Not on file   Highest education level: Not on file  Occupational History   Not on file  Tobacco Use   Smoking status: Never   Smokeless tobacco: Never  Substance and Sexual Activity   Alcohol use: Not Currently    Comment: OCCASIONAL beer   Drug use: No   Sexual activity: Yes    Birth control/protection: Pill     Comment: LOESTRIN 24 FE  Other Topics Concern   Not on file  Social History Narrative   Not on file   Social Determinants of Health   Financial Resource Strain: Not on file  Food Insecurity: Not on file  Transportation Needs: Not on file  Physical Activity: Not on file  Stress: Not on file  Social Connections: Not on file  Intimate Partner Violence: Not on file    Past Medical History, Surgical history, Social history, and Family history were reviewed and updated as appropriate.   Please see review of systems for further details on the patient's review from today.   Objective:   Physical Exam:  LMP  (LMP Unknown)   Physical Exam Constitutional:      General: She is not in acute distress. Neurological:     Mental Status: She is alert and oriented to person, place, and time.     Cranial Nerves: No dysarthria.     Motor: Weakness present.     Gait: Gait abnormal.  Psychiatric:        Attention and Perception: Attention and perception normal. She does not perceive auditory or visual hallucinations.        Mood and Affect: Mood is anxious and depressed. Affect is not labile, blunt or tearful.        Speech: Speech normal. Speech is not rapid and pressured.        Behavior: Behavior normal. Behavior is not slowed. Behavior is cooperative.        Thought Content: Thought content normal. Thought content is not delusional. Thought content does not include homicidal or suicidal ideation. Thought content does not include suicidal plan.        Cognition and Memory: Cognition and memory normal. Cognition is not impaired.        Judgment: Judgment normal.     Comments: Insight intact Ongoing OCD remains fairly severe but less anxious Chronic depression persistent but better     Lab Review:     Component Value Date/Time   NA 138 06/11/2021 0606   K 4.0 06/11/2021 0606   CL 107 06/11/2021 0606   CO2 26 06/11/2021 0606   GLUCOSE 90 06/11/2021 0606   BUN 18 06/11/2021 0606    CREATININE 0.81 06/11/2021 0606   CALCIUM 9.4 06/11/2021 0606   PROT 6.5 06/11/2021 0606   ALBUMIN 3.3 (L) 06/11/2021 0606   AST 17 06/11/2021 0606   ALT 14 06/11/2021 0606   ALKPHOS 141 (H) 06/11/2021 0606   BILITOT 0.2 (L) 06/11/2021 0606   GFRNONAA >  60 06/11/2021 0606   GFRAA >60 07/09/2016 0438       Component Value Date/Time   WBC 5.8 06/11/2021 0606   RBC 4.12 06/11/2021 0606   HGB 12.5 06/11/2021 0606   HCT 39.7 06/11/2021 0606   PLT 299 06/11/2021 0606   MCV 96.4 06/11/2021 0606   MCH 30.3 06/11/2021 0606   MCHC 31.5 06/11/2021 0606   RDW 13.9 06/11/2021 0606   LYMPHSABS 1.9 06/11/2021 0606   MONOABS 0.5 06/11/2021 0606   EOSABS 0.1 06/11/2021 0606   BASOSABS 0.0 06/11/2021 0606    No results found for: POCLITH, LITHIUM   No results found for: PHENYTOIN, PHENOBARB, VALPROATE, CBMZ   .res Assessment: Plan:    Imanni was seen today for follow-up, depression and anxiety.  Diagnoses and all orders for this visit:  Recurrent major depression resistant to treatment [facility])  Mixed obsessional thoughts and acts  Social anxiety disorder  Insomnia due to mental condition    Both Dx are TR and marked.  Impaired.  She is receiving Spravato 84 mg twice weekly and so far is seeing a bit of improvement.    The OCD remains severe and ongoing and minimal effect of Spravato so far on OCD.  She has been on higher doses of fluvoxamine above the usual max of 400 mg daily in the past.  This became difficult to obtain at 1 point and the dose was reduced to 300 mg daily.  She would like to try the 400 mg daily again to see if her OCD can be better controlled  She is also been on higher doses of Wellbutrin in the past at 450 mg daily she would like to try increasing that to maximize benefit for depression.  She is tolerating the meds well  Disc alternative for TR sx including Trintellix, Vraylar. Disc using the unusual combo of Trintellix and lower dose Fluvoxamine.   Increase  Luvox back to 400 mg nightly. Consider reevaluating dose.  Disc dosing Increase Wellbutrin XL back to 450 mg every morning for depression  Disc Spravato DT TRD incl details and SE. Disc dosing and duration.  Pt with severe depression MADRS 36 on9/13/22 Patient was administered Spravato 84 mg intranasally dosage today today.  The patient experienced the typical dissociation which gradually resolved over the 2-hour period of observation.  There were no complications.  Specifically the patient did not have nausea or vomiting or headache.  Blood pressures remained within normal ranges at the 40-minute and 2-hour follow-up intervals.  By the time the 2-hour observation period was met the patient was alert and oriented and able to exit without assistance. She tends to have lingering sedative effects but not severe. .  See nursing note for further details. Per protocol will continue Spravato to 84 mg next session.  Will plan twice weekly another week if possible and then reevaluate  We discussed the short-term risks associated with benzodiazepines including sedation and increased fall risk among others.  Discussed long-term side effect risk including dependence, potential withdrawal symptoms, and the potential eventual dose-related risk of dementia.  But recent studies from 2020 dispute this association between benzodiazepines and dementia risk. Newer studies in 2020 do not support an association with dementia.  Supportive therapy dealing with stressful family interactions lately leading to guilt and tears.  Also her cousin died unexpectedly recently.  Discussed grief and the difference between that and depression.  No other med changes today  Follow-up twice weekly another week bc uncertain she has  received full benefit of twice weekly.   Lynder Parents, MD, DFAPA  Please see After Visit Summary for patient specific instructions.  Future Appointments  Date Time Provider [place]  11/15/2021   3:00 PM Cottle, Billey Co., MD CP-CP None  11/15/2021  3:00 PM CP-NURSE CP-[facility] None  11/18/2021  3:00 PM Cottle, Billey Co., MD CP-CP None  11/18/2021  3:00 PM CP-NURSE CP-CP None  11/22/2021 11:00 AM Blanchie Serve, PhD CP-CP None  11/24/2021 10:45 AM Little, Olevia Bowens [facility]  12/06/2021 11:00 AM Blanchie Serve, PhD CP-CP None  12/16/2021 11:30 AM Cottle, Billey Co., MD CP-CP None  12/20/2021 11:00 AM Blanchie Serve, PhD CP-CP None  01/03/2022 11:00 AM Blanchie Serve, PhD CP-CP None    No orders of the defined types were placed in this encounter.    -------------------------------

## 2021-11-11 NOTE — Progress Notes (Signed)
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 B/P at 3:05 PM 119/72, 103. Pt instructed to blow her nose and to recline back at 45 degrees. Pt given first nasal spray (28 mg) administered by pt observed by nurse. There were 5 minutes between each dose, total of 84 mg. Tolerated well. Pt's medication is delivered by [facility] in [place] and stored inside a safe behind a locked door as well. Spravato is a CIII medication and has to be only given at a treatment facility and never given to patient.  Pt relaxes in recliner, no complaints voiced. No sedation noted. Pt does experience some dissociation but does clear up prior to discharge. Pt's 40 minute vital signs at 3:50 PM 111/73 84. [name] comes to discuss her care at the end of her treatment when her thoughts are clearer. No complaints of a headache today after her treatment. Discharge vitals at 5:05 PM 117/76, 85. Pt stable for discharge. Next treatment is this Thursday, January 26 th at 3:00 PM. Pt was observed on site a total of 120 minutes per FDA/REMS requirements. Pt was with nurse for clinical assessment a total of 55 minutes.    LOT [id number]

## 2021-11-12 ENCOUNTER — Other Ambulatory Visit: Payer: Self-pay | Admitting: Psychiatry

## 2021-11-14 ENCOUNTER — Other Ambulatory Visit: Payer: Self-pay | Admitting: Psychiatry

## 2021-11-14 DIAGNOSIS — F339 Major depressive disorder, recurrent, unspecified: Secondary | ICD-10-CM

## 2021-11-15 ENCOUNTER — Encounter: Payer: Self-pay | Admitting: Psychiatry

## 2021-11-15 ENCOUNTER — Ambulatory Visit: Payer: 59

## 2021-11-15 ENCOUNTER — Other Ambulatory Visit: Payer: Self-pay

## 2021-11-15 ENCOUNTER — Ambulatory Visit (INDEPENDENT_AMBULATORY_CARE_PROVIDER_SITE_OTHER): Payer: 59 | Admitting: Psychiatry

## 2021-11-15 VITALS — BP 125/86 | HR 86

## 2021-11-15 DIAGNOSIS — F4010 Social phobia, unspecified: Secondary | ICD-10-CM

## 2021-11-15 DIAGNOSIS — F422 Mixed obsessional thoughts and acts: Secondary | ICD-10-CM

## 2021-11-15 DIAGNOSIS — F5105 Insomnia due to other mental disorder: Secondary | ICD-10-CM

## 2021-11-15 DIAGNOSIS — F339 Major depressive disorder, recurrent, unspecified: Secondary | ICD-10-CM

## 2021-11-15 NOTE — Progress Notes (Signed)
Casey Diaz [id number] 07/01/1966 56 y.o.    Subjective:   Patient ID:  Casey Diaz is a 56 y.o. (DOB [birth year]) female.  Chief Complaint:  Chief Complaint  Patient presents with   Follow-up   Depression   Anxiety     HPI Casey Diaz presents to the office today for follow-up of OCD and severe anxiety.     December 2019 visit the following was noted: No meds were changed. Lives in [place] and back for followup.  Sx are about the same.  Has to take meds with different sizes. Pt reports that mood is Anxious and Depressed and describes anxiety as Severe. Anxiety symptoms include: Excessive Worry, Obsessive Compulsive Symptoms:   Checking,,. Pt reports has interrupted sleep and nocturia. Pt reports that appetite is good. Pt reports that energy is no change and down slightly. Concentration is down slightly. Suicidal thoughts:  denied by patient. Loves the environment of [place] but misses some things there.  She's not able to work there.  H works there and likes it.  Struggled with not working, feels isolated and not up to task of meeting people.  Does attend a church and met a friend who's been helpful.  Leaving for [place] on 10/16/18.   04/09/2020 appointment the following is noted:  Staying another year in [place] bc Covid and other things. Last few months a lot of crying spells.  Is in menopause. Wonders about med changes though is nervous about it.  Crying spells associated with depressing thoughts more than stress or OCD.   Covid really hard on everyone and couldn't see family for 18 mos.  Family still very dysfunctional. No close friends in part due to OCD and depression. Son high Autism spectrum with ADHD and anxiety and she's with him all the time. Greater health problems with CP so more pains.   05/15/20 appt with the following noted: [name] for menopause and helps some. Still depressed.  Chronically. In [place] for 2 more weeks then to [place] for another  year. A lot of stressors lately triggering more checking and anxiety.   OCD is her CC now and seems.  Got worse DT stress.   Stressed with Asberger's son and her health.  H works a lot.  Her FOO still stress. Plan: Trintellix 10 mg 1 tablet in the morning with food and reduce fluvoxamine to 5 tablets nightly for 1 week  then reduce it to 4 tablets nightly.   07/02/20 appt with the following noted: Decided not to get Trintellix bc difficulty getting it. It is available.  There.  Wants to start it now.   Both depression and OCD are severe.  Not suicidal in intent or plan. Did not take samples with her to [place] but will be back in December. covid is worse there and travel is difficult.  Wants to reduce Wellbutrin DT dry mouth. Plan: She's afraid to reduce Luvox at this time DT fear of worsening OCD.  But will consider. Trintellix 10 mg 1 tablet in the morning with food and reduce fluvoxamine to 5 tablets nightly for 1 week  then reduce it to 4 tablets nightly. Also reduce Wellbutrin XL to 300 mg daily.    9-13 2022 appointment with the following noted: Back in [place] since July 14.  Broke arm a month ago and surgery.  It's all been rough adjustment.   B has cancer on his face and M fell taking him to the doctor.  Misses the water  and weather of [place].   Cry a lot more since menopause. Still depression and anxiety and OCD.  Asks about ketamine. On Wellbutrin 300, Luvox 300.  No Trintellix. Added Ativan 2 mg AM and HS and it helps.  More likely to get upset at night. Plan: Increase Luvox back to 400 mg daily.  She thinks she's worse on less. Continue Wellbutrin XL to 300 mg daily. 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  She tolerated it well without unusual nausea or vomiting headache or other somatic symptoms.  She did have the expected dissociation which gradually resolved over the course  of the 2-hour period of observation.  She was a little concerned about her balance given her cerebral palsy but has not noted unusual or unexpected problems.  She is motivated to can continue Spravato in hopes of reducing her depressive symptoms. 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  She also has treatment resistant OCD which is partially managed with medications but is still quite disabling.  She is tolerating the medications well.  She is sleeping adequately.  Her appetite is adequate.  She is not having suicidal thoughts.  She continues to wish for a better treatment for OCD that would give her some relief.  09/30/2021 appointment with the following noted: She received her first dose of Spravato 84 mg intranasally today.  She tolerated it well without unusual nausea, vomiting, or other somatic symptoms.  Dissociation as expected did occur and gradually resolved over the 2-hour period of observation.  She did have a mild headache today with the treatment and received ibuprofen 600 mg at her request.  We will follow this to see if it is a pattern Patient is still depressed.  She said she was late with her medicine today and today was a particularly depressing day.  However she notes that the Spravato has lifted her mood considerably even today.  She is hopeful that it will continue to be helpful.  No suicidal thoughts.  She has ongoing chronic anxiety and OCD at baseline.  10/04/21 appt noted: Patient received Spravato 84 mg for the second time today.  She tolerated it well without any unusual headache, nausea or vomiting or other somatic symptoms.  Dissociation did occur and she gradually [place] resolution over the 2-hour period of observation. She did not have any unusual problems after she left the office last Spravato administration.  She did not have any specific problems with balance or walking.  She is at increased risk of that difficulty because of cerebral  palsy.  So far she has not noticed much mood effect from the medication beyond the first day of receiving it.  However she would like to continue Spravato in hopes of getting the antidepressant effect that is desired. Stress dealing with mother's behavior at party pt hosted.  Guilt over it.  10/07/2021 appointment noted: Patient received Spravato 84 mg for the second time today.  She tolerated it well without any unusual headache, nausea or vomiting or other somatic symptoms.  Dissociation did occur and she gradually [place] resolution over the 2-hour period of observation. She still is not sure about the antidepressant effect of Spravato.  Events over the holidays and demands, make it difficult to assess.  She still notes that the OCD tends to worsen the depression and vice versa.  She tolerates the Spravato well and  wants to continue the trial.  10/15/2021 appointment with the following noted: Patient received Spravato 84 mg for the second time today.  She tolerated it well without any unusual headache, nausea or vomiting or other somatic symptoms.  Dissociation did occur and she gradually [place] resolution over the 2-hour period of observation. Patient says it was somewhat difficult to evaluate the effect of the Spravato.  It was scheduled to be twice weekly for 4 weeks consecutively but the holidays have interfered with that administration.  She asked what specifically should be she should be looking for in order to assess improvement.  That was discussed.  The OCD is unchanged and the depression so far is not significantly different.  She still tolerates meds.  There have been no recent med changes  10/19/2021 appt noted: Patient received Spravato 84 mg for the second today.  She tolerated it well without any unusual headache, nausea or vomiting or other somatic symptoms.  Dissociation did occur and she gradually saw resolution over the 2-hour period of observation.   10/21/2021 appointment noted: Patient  received Spravato 84 mg today.  She tolerated it well without any unusual headache, nausea or vomiting or other somatic symptoms.  Dissociation did occur and she gradually saw resolution over the 2-hour period of observation.  She feels better than last week.  She is not as depressed and down.  She is still dealing with grief around the death of her cousin that was unexpected.  It is still difficult to tell how much the Spravato was doing but she is hopeful.  Anxiety is still present with the OCD.  She is not having suicidal thoughts.  She is not hopeless.  She wants to continue treatment.  10/25/2021 appointment with the following noted: Patient received Spravato 84 mg today.  She tolerated it well without any unusual headache, nausea or vomiting or other somatic symptoms.  Dissociation did occur and she gradually saw resolution over the 2-hour period of observation.  She does not typically find the dissociation very strong. She is beginning to think the Spravato is helping somewhat with the depression.  It has been difficult to tell with the holidays intervening as well as the death of her cousin.  She has not been able to get Spravato twice weekly for 4 weeks straight as typically planned.  However she is hopeful.  The OCD remains significant.  She still has a tendency to think very negatively.  She is not suicidal.  10/28/2021 appointment with the following noted: Patient received Spravato 84 mg today.  She tolerated it well without any unusual headache, nausea or vomiting or other somatic symptoms.  Dissociation did occur and she gradually saw resolution over the 2-hour period of observation.  She does not typically find the dissociation very strong. She is feeling more hopeful about the administration of Spravato.  She is having less depression she believes.  Still not dramatically different.  She still has a tendency to have a lot of anxiety and rumination and OCD.  She is not suicidal.  She is eager to  continue the Spravato.  11/01/2021 appointment with the following noted: Patient received Spravato 84 mg today.  She tolerated it well without any unusual headache, nausea or vomiting or other somatic symptoms.  Dissociation did occur and she gradually saw resolution over the 2-hour period of observation.  She does not typically find the dissociation very strong. She is continuing to see a little bit of improvement in depression with Spravato.  The anxiety remains but may be not as severe.  The OCD remains markedly severe chronically.  She is not suicidal.  She is encouraged by the degree of improvement with Spravato and inability to enjoy things more and not be quite as ruminative.  11/04/2021 appt noted: Patient received Spravato 84 mg today.  She tolerated it well without any unusual headache, nausea or vomiting or other somatic symptoms.  Dissociation did occur and she gradually saw resolution over the 2-hour period of observation.  She does not typically find the dissociation very strong. No SE complaints with meds. She continues to feel hopeful about the Spravato.  She has less depression.  Because of a number of factors she is uncertain of the full benefit but thinks she is somewhat less depressed.  Her anxiety and OCD remain significant but a little better.  She is tolerating the medications and does not desire medicine change.  She is not currently complaining of insomnia.   11/08/2021 appointment the following noted: Patient received Spravato 84 mg today.  She tolerated it well without any unusual headache, nausea or vomiting or other somatic symptoms.  Dissociation did occur and she gradually saw resolution over the 2-hour period of observation.  She does not typically find the dissociation very strong. No SE complaints with meds. She feels the Spravato is helping somewhat.  She would like to see a greater effect.  However she is able to enjoy things.  She is productive at home.  She would like  to see a lifting of a degree of sadness that remains.  The anxiety and OCD remained largely unchanged.  She wondered about the dosing of Wellbutrin 300 mg a day and Luvox 300 mg a day and possible increases.  She has been at higher doses in the past.  She plans to start water therapy for her weakness and for her shoulder.  11/11/2021 appointment with the following noted: Patient received Spravato 84 mg today.  She tolerated it well without any unusual headache, nausea or vomiting or other somatic symptoms.  Dissociation did occur and she gradually saw resolution over the 2-hour period of observation.  She does not typically find the dissociation very strong. No SE complaints with meds. She feels the Spravato is clearly helping the depression.  She would like to see a more significant effect.  She is still having trouble thinking positive. Her energy is fair.  Concentration is good except for the problem with chronic obsessions. She has been taking Wellbutrin 300 mg in Luvox 300 mg for quite some time but has taken higher doses in the past.  We discussed that.  She would like to try higher doses in order to get a better effect if possible. We just increased the doses a couple of days ago.  No effect yet.  11/15/2021 appointment with the following noted: Patient received Spravato 84 mg today.  She tolerated it well without any unusual headache, nausea or vomiting or other somatic symptoms.  Dissociation did occur and she gradually saw resolution over the 2-hour period of observation.  She does not typically find the dissociation very strong. No SE complaints with meds. The patient is now convinced that the Spravato is helping the depression.  She would like to continue twice weekly Spravato this week if possible.  She has tolerated the increase in Wellbutrin to 450 mg daily and the increase and fluvoxamine to 400 mg daily without complications thus far.  The OCD and anxiety feed the depression to  some  extent. She spends approximately 2 hours daily with checking compulsions due to obsessions about causing harm to others.  For example fearing that when she has hit a pot hole that she may have hit a person and going back to check.  Checking corners and rooms out of fear that she may have harmed someone.  Other various checking compulsions.  She is hoping the increase in fluvoxamine to 400 mg will reduce that over the weeks to come.  She is not seeing a significant difference with the addition of the Spravato though she understands that was not expected.  She is more productive at home and more motivated and able to enjoy things more fully as a result of the Spravato treatment.  She is tolerating the medication   Previous psych med trials include Prozac, paroxetine, sertraline, fluvoxamine, venlafaxine, Anafranil with no response,  Wellbutrin, , Viibryd, Trintellix 10 1 month NR Geodon,  risperidone, Rexulti, Abilify,  Seroquel, Latuda 40 mg with irritability. lamotrigine lithium, BuSpar, Namenda,  pramipexole with no response, and Topamax, pindolol  ECT-MADRS    [facility] Office Visit from 06/29/2021 in [facility] Total Score 36      Flowsheet Row Admission (Discharged) from 06/11/2021 in [facility] No Risk        Review of Systems:  Review of Systems  Constitutional:  Positive for fatigue.  Cardiovascular:  Negative for palpitations.  Musculoskeletal:  Positive for arthralgias, back pain and gait problem. Negative for joint swelling.  Neurological:  Positive for weakness. Negative for tremors.   Medications: I have reviewed the patient's current medications.  Current Outpatient Medications  Medication Sig Dispense Refill   Abaloparatide (TYMLOS) 3120 MCG/1.56ML SOPN Inject into the skin.     Azelastine-Fluticasone 137-50 MCG/ACT SUSP Place 1-2 sprays into both nostrils daily.     baclofen (LIORESAL) 10 MG tablet  Take 20 mg by mouth at bedtime as needed for muscle spasms.     buPROPion (WELLBUTRIN XL) 150 MG 24 hr tablet Take 3 tablets (450 mg total) by mouth daily. 270 tablet 0   dicyclomine (BENTYL) 10 MG capsule Take 10 mg by mouth daily.     docusate sodium (COLACE) 100 MG capsule Take 1 capsule (100 mg total) by mouth 2 (two) times daily. (Patient taking differently: Take 100 mg by mouth daily.) 10 capsule 0   fexofenadine (ALLEGRA) 180 MG tablet Take 180 mg by mouth daily.     fluvoxaMINE (LUVOX) 100 MG tablet Take 4 tablets (400 mg total) by mouth at bedtime. 360 tablet 0   hydrocortisone (ANUSOL-HC) 2.5 % rectal cream Place rectally 2 (two) times daily. x 7-14 days 30 g 0   ketotifen (ZADITOR) 0.025 % ophthalmic solution Place 3 drops into both eyes at bedtime.     LORazepam (ATIVAN) 1 MG tablet Take 2 tablets (2 mg total) by mouth 2 (two) times daily. 120 tablet 2   magnesium gluconate (MAGONATE) 500 MG tablet Take 500 mg by mouth daily.     MIBELAS 24 FE 1-20 MG-MCG(24) CHEW Chew 1 tablet by mouth at bedtime as needed (bowel regularity).     Multiple Vitamins-Minerals (ADULT GUMMY PO) Take 2 tablets by mouth in the morning.     nitrofurantoin (MACRODANTIN) 100 MG capsule Take 100 mg by mouth as needed (For urinary tract infection.).      oxyCODONE-acetaminophen (PERCOCET/ROXICET) 5-325 MG tablet Take 1-2 tablets by mouth every 6 (six) hours as needed  for severe pain. 50 tablet 0   polyethylene glycol (MIRALAX / GLYCOLAX) packet Take 17 g by mouth daily as needed for mild constipation. 14 each 0   psyllium (METAMUCIL) 58.6 % powder Take 1 packet by mouth daily as needed (constipation).     SPRAVATO, 84 MG DOSE, 28 MG/DEVICE SOPK USE NASALLY AS DIRECTED ON PACKAGE LABEL TWICE A WEEK 3 each 3   temazepam (RESTORIL) 30 MG capsule Take 1 capsule (30 mg total) by mouth at bedtime as needed for sleep. 30 capsule 5   Vitamin D-Vitamin K (VITAMIN K2-VITAMIN D3 PO) Take 1-2 sprays by mouth daily.     No  current facility-administered medications for this visit.    Medication Side Effects: None   Allergies:  Allergies  Allergen Reactions   Hydrocodone Itching   Sulfamethoxazole-Trimethoprim Itching   Dust Mite Extract Other (See Comments)    Sneezing, watery eyes, runny nose   Latex Itching   Other Other (See Comments)    PT IS ALLERGIC TO CAT DANDER AND RAGWEED - Sneezing, watery eyes, runny nose    Pollen Extract Other (See Comments)    Sneezing, watery eyes, runny nose     Past Medical History:  Diagnosis Date   Abnormal Pap smear 2011   hpv/mild dysplasia,cin1   Anxiety    Cerebral palsy (HCC)    right arm/leg   Cystocele    Depression    Headache    Neuromuscular disorder (HCC)    Cerebral Palsy   OCD (obsessive compulsive disorder)    Osteoporosis    Uterine prolaps     Family History  Problem Relation Age of Onset   Cancer Father        skin AND LUNG   Alcohol abuse Sister        CRACK COCAINE    Social History   Socioeconomic History   Marital status: Married    Spouse name: Not on file   Number of children: Not on file   Years of education: Not on file   Highest education level: Not on file  Occupational History   Not on file  Tobacco Use   Smoking status: Never   Smokeless tobacco: Never  Substance and Sexual Activity   Alcohol use: Not Currently    Comment: OCCASIONAL beer   Drug use: No   Sexual activity: Yes    Birth control/protection: Pill    Comment: LOESTRIN 24 FE  Other Topics Concern   Not on file  Social History Narrative   Not on file   Social Determinants of Health   Financial Resource Strain: Not on file  Food Insecurity: Not on file  Transportation Needs: Not on file  Physical Activity: Not on file  Stress: Not on file  Social Connections: Not on file  Intimate Partner Violence: Not on file    Past Medical History, Surgical history, Social history, and Family history were reviewed and updated as appropriate.    Please see review of systems for further details on the patient's review from today.   Objective:   Physical Exam:  LMP  (LMP Unknown)   Physical Exam Constitutional:      General: She is not in acute distress. Neurological:     Mental Status: She is alert and oriented to person, place, and time.     Cranial Nerves: No dysarthria.     Motor: Weakness present.     Gait: Gait abnormal.  Psychiatric:  Attention and Perception: Attention and perception normal. She does not perceive auditory or visual hallucinations.        Mood and Affect: Mood is anxious and depressed. Affect is not labile, blunt or tearful.        Speech: Speech normal. Speech is not rapid and pressured.        Behavior: Behavior normal. Behavior is not slowed. Behavior is cooperative.        Thought Content: Thought content normal. Thought content is not delusional. Thought content does not include homicidal or suicidal ideation. Thought content does not include suicidal plan.        Cognition and Memory: Cognition and memory normal. Cognition is not impaired.        Judgment: Judgment normal.     Comments: Insight intact Ongoing OCD remains fairly severe but less anxious Checking outside office 2 hours daily Chronic depression persistent but better     Lab Review:     Component Value Date/Time   NA 138 06/11/2021 0606   K 4.0 06/11/2021 0606   CL 107 06/11/2021 0606   CO2 26 06/11/2021 0606   GLUCOSE 90 06/11/2021 0606   BUN 18 06/11/2021 0606   CREATININE 0.81 06/11/2021 0606   CALCIUM 9.4 06/11/2021 0606   PROT 6.5 06/11/2021 0606   ALBUMIN 3.3 (L) 06/11/2021 0606   AST 17 06/11/2021 0606   ALT 14 06/11/2021 0606   ALKPHOS 141 (H) 06/11/2021 0606   BILITOT 0.2 (L) 06/11/2021 0606   GFRNONAA >60 06/11/2021 0606   GFRAA >60 07/09/2016 0438       Component Value Date/Time   WBC 5.8 06/11/2021 0606   RBC 4.12 06/11/2021 0606   HGB 12.5 06/11/2021 0606   HCT 39.7 06/11/2021 0606   PLT  299 06/11/2021 0606   MCV 96.4 06/11/2021 0606   MCH 30.3 06/11/2021 0606   MCHC 31.5 06/11/2021 0606   RDW 13.9 06/11/2021 0606   LYMPHSABS 1.9 06/11/2021 0606   MONOABS 0.5 06/11/2021 0606   EOSABS 0.1 06/11/2021 0606   BASOSABS 0.0 06/11/2021 0606    No results found for: POCLITH, LITHIUM   No results found for: PHENYTOIN, PHENOBARB, VALPROATE, CBMZ   .res Assessment: Plan:    Morgana was seen today for follow-up, depression and anxiety.  Diagnoses and all orders for this visit:  Recurrent major depression resistant to treatment [facility])  Mixed obsessional thoughts and acts  Social anxiety disorder  Insomnia due to mental condition    Both Dx are TR and marked.  Impaired.   She is receiving Spravato 84 mg twice weekly and marked improvement in the depression..    The OCD remains severe and ongoing and minimal effect of Spravato so far on OCD.  Spends 2 hours daily and checking compulsions.  She has been on higher doses of fluvoxamine above the usual max of 400 mg daily in the past.  This became difficult to obtain at 1 point and the dose was reduced to 300 mg daily.   She would like tocontinue Luvox 400 mg daily again to see if her OCD can be better controlled  She is also been on higher doses of Wellbutrin in the past at 450 mg daily she would like to try increasing that to maximize benefit for depression.  She is tolerating the meds well  Disc alternative for TR sx including Trintellix, Vraylar. Disc using the unusual combo of Trintellix and lower dose Fluvoxamine.   Increased Luvox back to 400 mg  nightly as of the third week of January 2023. Consider reevaluating dose.  Disc dosing Increased Wellbutrin XL back to 450 mg every morning for depression as of the third week of January 2023  Disc Spravato DT TRD incl details and SE. Disc dosing and duration.  Pt with severe depression MADRS 36 on9/13/22 Patient was administered Spravato 84 mg intranasally dosage today.  The  patient experienced the typical dissociation which gradually resolved over the 2-hour period of observation.  There were no complications.  Specifically the patient did not have nausea or vomiting or headache.  Blood pressures remained within normal ranges at the 40-minute and 2-hour follow-up intervals.  By the time the 2-hour observation period was met the patient was alert and oriented and able to exit without assistance. She tends to have lingering sedative effects but not severe. .  See nursing note for further details. Per protocol will continue Spravato to 84 mg next session.  Will plan twice weekly another week if possible and then reevaluate  We discussed the short-term risks associated with benzodiazepines including sedation and increased fall risk among others.  Discussed long-term side effect risk including dependence, potential withdrawal symptoms, and the potential eventual dose-related risk of dementia.  But recent studies from 2020 dispute this association between benzodiazepines and dementia risk. Newer studies in 2020 do not support an association with dementia.  Supportive therapy dealing with stressful family interactions lately leading to guilt and tears.  Also her cousin died unexpectedly recently.  Discussed grief and the difference between that and depression.  No other med changes today  Follow-up twice weekly another week bc uncertain she has received full benefit of twice weekly.   Lynder Parents, MD, DFAPA  Please see After Visit Summary for patient specific instructions.  Future Appointments  Date Time Provider [place]  11/18/2021  3:00 PM Cottle, Billey Co., MD [facility] None  11/18/2021  3:00 PM CP-NURSE [facility] None  11/22/2021 11:00 AM Blanchie Serve, PhD [facility] None  11/24/2021 10:45 AM Little, Olevia Bowens [facility]  12/06/2021 11:00 AM Blanchie Serve, PhD [facility] None  12/16/2021 11:30 AM Cottle, Billey Co., MD [facility] None  12/20/2021 11:00 AM Blanchie Serve,  PhD [facility] None  01/03/2022 11:00 AM Blanchie Serve, PhD [facility] None    No orders of the defined types were placed in this encounter.    -------------------------------

## 2021-11-16 NOTE — Progress Notes (Signed)
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 B/P at 3:10 PM 121/84, 85. Pt instructed to blow her nose and to recline back at 45 degrees. Pt given first nasal spray (28 mg) administered by pt observed by nurse. There were 5 minutes between each dose, total of 84 mg. Tolerated well. Pt's medication is delivered by [facility] in [place] and stored inside a safe behind a locked door as well. Spravato is a CIII medication and has to be only given at a treatment facility and never given to patient.  Pt relaxes in recliner, no complaints voiced. No sedation noted. Pt does experience some dissociation but does clear up prior to discharge. Pt's 40 minute vital signs at 3:50 PM 128/84 83. Dr. Clovis Pu comes to discuss her care at the end of her treatment when her thoughts are clearer. No complaints of a headache today after her treatment. Discharge vitals at 5:10 PM 134/88, 83. Pt stable for discharge. Next treatment is this coming Monday, January 30 th at 3:00 PM. Pt was observed on site a total of 120 minutes per FDA/REMS requirements. Pt was with nurse for clinical assessment a total of 55 minutes.    LOT [id number] EXP [id number]

## 2021-11-17 NOTE — Progress Notes (Signed)
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 B/P at 3:15 PM 114/74, 103. Pt instructed to blow her nose and to recline back at 45 degrees. Pt given first nasal spray (28 mg) administered by pt observed by nurse. There were 5 minutes between each dose, total of 84 mg. Tolerated well. Pt's medication is delivered by [facility] in [place] and stored inside a safe behind a locked door as well. Spravato is a CIII medication and has to be only given at a treatment facility and never given to patient.  Pt relaxes in recliner, no complaints voiced. No sedation noted. Pt does experience some dissociation but does clear up prior to discharge. Pt's 40 minute vital signs at 3:55 PM 115/68 90. [name] comes to discuss her care at the end of her treatment when her thoughts are clearer. No complaints of a headache today after her treatment. Discharge vitals at 5:15 PM 125/86, 86. Pt stable for discharge. Next treatment is this coming Thursday, February 2nd at 3:00 PM. Pt was observed on site a total of 120 minutes per FDA/REMS requirements. Pt was with nurse for clinical assessment a total of 55 minutes.    LOT [id number] EXP [id number]

## 2021-11-18 ENCOUNTER — Encounter: Payer: Self-pay | Admitting: Psychiatry

## 2021-11-18 ENCOUNTER — Telehealth: Payer: Self-pay | Admitting: Psychiatry

## 2021-11-18 ENCOUNTER — Other Ambulatory Visit: Payer: Self-pay | Admitting: Psychiatry

## 2021-11-18 ENCOUNTER — Ambulatory Visit (INDEPENDENT_AMBULATORY_CARE_PROVIDER_SITE_OTHER): Payer: 59 | Admitting: Psychiatry

## 2021-11-18 ENCOUNTER — Ambulatory Visit: Payer: 59

## 2021-11-18 ENCOUNTER — Other Ambulatory Visit: Payer: Self-pay

## 2021-11-18 VITALS — BP 115/71 | HR 86

## 2021-11-18 DIAGNOSIS — G809 Cerebral palsy, unspecified: Secondary | ICD-10-CM

## 2021-11-18 DIAGNOSIS — F4010 Social phobia, unspecified: Secondary | ICD-10-CM | POA: Diagnosis not present

## 2021-11-18 DIAGNOSIS — F339 Major depressive disorder, recurrent, unspecified: Secondary | ICD-10-CM | POA: Diagnosis not present

## 2021-11-18 DIAGNOSIS — F422 Mixed obsessional thoughts and acts: Secondary | ICD-10-CM

## 2021-11-18 DIAGNOSIS — F5105 Insomnia due to other mental disorder: Secondary | ICD-10-CM

## 2021-11-18 NOTE — Telephone Encounter (Signed)
Pt  lm stating that she needs a refill on her lorazepam 1 mg to the [facility] located at [address]. Pt has 3 days left

## 2021-11-18 NOTE — Progress Notes (Signed)
Casey Diaz [id number] 07/01/1966 56 y.o.    Subjective:   Patient ID:  Casey Diaz is a 56 y.o. (DOB [birth year]) female.  Chief Complaint:  Chief Complaint  Patient presents with   Follow-up   Depression   Anxiety     HPI Casey Diaz presents to the office today for follow-up of OCD and severe anxiety.     December 2019 visit the following was noted: No meds were changed. Lives in [place] and back for followup.  Sx are about the same.  Has to take meds with different sizes. Pt reports that mood is Anxious and Depressed and describes anxiety as Severe. Anxiety symptoms include: Excessive Worry, Obsessive Compulsive Symptoms:   Checking,,. Pt reports has interrupted sleep and nocturia. Pt reports that appetite is good. Pt reports that energy is no change and down slightly. Concentration is down slightly. Suicidal thoughts:  denied by patient. Loves the environment of [place] but misses some things there.  She's not able to work there.  H works there and likes it.  Struggled with not working, feels isolated and not up to task of meeting people.  Does attend a church and met a friend who's been helpful.  Leaving for [place] on 10/16/18.   04/09/2020 appointment the following is noted:  Staying another year in [place] bc Covid and other things. Last few months a lot of crying spells.  Is in menopause. Wonders about med changes though is nervous about it.  Crying spells associated with depressing thoughts more than stress or OCD.   Covid really hard on everyone and couldn't see family for 18 mos.  Family still very dysfunctional. No close friends in part due to OCD and depression. Son high Autism spectrum with ADHD and anxiety and she's with him all the time. Greater health problems with CP so more pains.   05/15/20 appt with the following noted: Casey Diaz for menopause and helps some. Still depressed.  Chronically. In [place] for 2 more weeks then to [place] for another  year. A lot of stressors lately triggering more checking and anxiety.   OCD is her CC now and seems.  Got worse DT stress.   Stressed with Asberger's son and her health.  H works a lot.  Her FOO still stress. Plan: Trintellix 10 mg 1 tablet in the morning with food and reduce fluvoxamine to 5 tablets nightly for 1 week  then reduce it to 4 tablets nightly.   07/02/20 appt with the following noted: Decided not to get Trintellix bc difficulty getting it. It is available.  There.  Wants to start it now.   Both depression and OCD are severe.  Not suicidal in intent or plan. Did not take samples with her to [place] but will be back in December. covid is worse there and travel is difficult.  Wants to reduce Wellbutrin DT dry mouth. Plan: She's afraid to reduce Luvox at this time DT fear of worsening OCD.  But will consider. Trintellix 10 mg 1 tablet in the morning with food and reduce fluvoxamine to 5 tablets nightly for 1 week  then reduce it to 4 tablets nightly. Also reduce Wellbutrin XL to 300 mg daily.    9-13 2022 appointment with the following noted: Back in [place] since July 14.  Broke arm a month ago and surgery.  It's all been rough adjustment.   B has cancer on his face and M fell taking him to the doctor.  Misses the water  and weather of [place].   Cry a lot more since menopause. Still depression and anxiety and OCD.  Asks about ketamine. On Wellbutrin 300, Luvox 300.  No Trintellix. Added Ativan 2 mg AM and HS and it helps.  More likely to get upset at night. Plan: Increase Luvox back to 400 mg daily.  She thinks she's worse on less. Continue Wellbutrin XL to 300 mg daily. Plan to start Spravato for TRD asap   09/27/2021 appointment with the following noted:  She has started Spravato today at 56 y.o. intranasally.  She tolerated it well without unusual nausea or vomiting headache or other somatic symptoms.  She did have the expected dissociation which gradually resolved over the course  of the 2-hour period of observation.  She was a little concerned about her balance given her cerebral palsy but has not noted unusual or unexpected problems.  She is motivated to can continue Spravato in hopes of reducing her depressive symptoms. 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  She also has treatment resistant OCD which is partially managed with medications but is still quite disabling.  She is tolerating the medications well.  She is sleeping adequately.  Her appetite is adequate.  She is not having suicidal thoughts.  She continues to wish for a better treatment for OCD that would give her some relief.  09/30/2021 appointment with the following noted: She received her first dose of Spravato 54 mg intranasally today.  She tolerated it well without unusual nausea, vomiting, or other somatic symptoms.  Dissociation as expected did occur and gradually resolved over the 2-hour period of observation.  She did have a mild headache today with the treatment and received ibuprofen 600 mg at her request.  We will follow this to see if it is a pattern Patient is still depressed.  She said she was late with her medicine today and today was a particularly depressing day.  However she notes that the Spravato has lifted her mood considerably even today.  She is hopeful that it will continue to be helpful.  No suicidal thoughts.  She has ongoing chronic anxiety and OCD at baseline.  10/04/21 appt noted: Patient received Spravato 54 mg for the second time today.  She tolerated it well without any unusual headache, nausea or vomiting or other somatic symptoms.  Dissociation did occur and she gradually [place] resolution over the 2-hour period of observation. She did not have any unusual problems after she left the office last Spravato administration.  She did not have any specific problems with balance or walking.  She is at increased risk of that difficulty because of cerebral  palsy.  So far she has not noticed much mood effect from the medication beyond the first day of receiving it.  However she would like to continue Spravato in hopes of getting the antidepressant effect that is desired. Stress dealing with mother's behavior at party pt hosted.  Guilt over it.  10/07/2021 appointment noted: Patient received Spravato 54 mg for the second time today.  She tolerated it well without any unusual headache, nausea or vomiting or other somatic symptoms.  Dissociation did occur and she gradually [place] resolution over the 2-hour period of observation. She still is not sure about the antidepressant effect of Spravato.  Events over the holidays and demands, make it difficult to assess.  She still notes that the OCD tends to worsen the depression and vice versa.  She tolerates the Spravato well and  wants to continue the trial.  10/15/2021 appointment with the following noted: Patient received Spravato 54 mg for the second time today.  She tolerated it well without any unusual headache, nausea or vomiting or other somatic symptoms.  Dissociation did occur and she gradually [place] resolution over the 2-hour period of observation. Patient says it was somewhat difficult to evaluate the effect of the Spravato.  It was scheduled to be twice weekly for 4 weeks consecutively but the holidays have interfered with that administration.  She asked what specifically should be she should be looking for in order to assess improvement.  That was discussed.  The OCD is unchanged and the depression so far is not significantly different.  She still tolerates meds.  There have been no recent med changes  10/19/2021 appt noted: Patient received Spravato 54 mg for the second today.  She tolerated it well without any unusual headache, nausea or vomiting or other somatic symptoms.  Dissociation did occur and she gradually saw resolution over the 2-hour period of observation.   10/21/2021 appointment noted: Patient  received Spravato 84 mg today.  She tolerated it well without any unusual headache, nausea or vomiting or other somatic symptoms.  Dissociation did occur and she gradually saw resolution over the 2-hour period of observation.  She feels better than last week.  She is not as depressed and down.  She is still dealing with grief around the death of her cousin that was unexpected.  It is still difficult to tell how much the Spravato was doing but she is hopeful.  Anxiety is still present with the OCD.  She is not having suicidal thoughts.  She is not hopeless.  She wants to continue treatment.  10/25/2021 appointment with the following noted: Patient received Spravato 84 mg today.  She tolerated it well without any unusual headache, nausea or vomiting or other somatic symptoms.  Dissociation did occur and she gradually saw resolution over the 2-hour period of observation.  She does not typically find the dissociation very strong. She is beginning to think the Spravato is helping somewhat with the depression.  It has been difficult to tell with the holidays intervening as well as the death of her cousin.  She has not been able to get Spravato twice weekly for 4 weeks straight as typically planned.  However she is hopeful.  The OCD remains significant.  She still has a tendency to think very negatively.  She is not suicidal.  10/28/2021 appointment with the following noted: Patient received Spravato 84 mg today.  She tolerated it well without any unusual headache, nausea or vomiting or other somatic symptoms.  Dissociation did occur and she gradually saw resolution over the 2-hour period of observation.  She does not typically find the dissociation very strong. She is feeling more hopeful about the administration of Spravato.  She is having less depression she believes.  Still not dramatically different.  She still has a tendency to have a lot of anxiety and rumination and OCD.  She is not suicidal.  She is eager to  continue the Spravato.  11/01/2021 appointment with the following noted: Patient received Spravato 84 mg today.  She tolerated it well without any unusual headache, nausea or vomiting or other somatic symptoms.  Dissociation did occur and she gradually saw resolution over the 2-hour period of observation.  She does not typically find the dissociation very strong. She is continuing to see a little bit of improvement in depression with Spravato.  The anxiety remains but may be not as severe.  The OCD remains markedly severe chronically.  She is not suicidal.  She is encouraged by the degree of improvement with Spravato and inability to enjoy things more and not be quite as ruminative.  11/04/2021 appt noted: Patient received Spravato 84 mg today.  She tolerated it well without any unusual headache, nausea or vomiting or other somatic symptoms.  Dissociation did occur and she gradually saw resolution over the 2-hour period of observation.  She does not typically find the dissociation very strong. No SE complaints with meds. She continues to feel hopeful about the Spravato.  She has less depression.  Because of a number of factors she is uncertain of the full benefit but thinks she is somewhat less depressed.  Her anxiety and OCD remain significant but a little better.  She is tolerating the medications and does not desire medicine change.  She is not currently complaining of insomnia.   11/08/2021 appointment the following noted: Patient received Spravato 84 mg today.  She tolerated it well without any unusual headache, nausea or vomiting or other somatic symptoms.  Dissociation did occur and she gradually saw resolution over the 2-hour period of observation.  She does not typically find the dissociation very strong. No SE complaints with meds. She feels the Spravato is helping somewhat.  She would like to see a greater effect.  However she is able to enjoy things.  She is productive at home.  She would like  to see a lifting of a degree of sadness that remains.  The anxiety and OCD remained largely unchanged.  She wondered about the dosing of Wellbutrin 300 mg a day and Luvox 300 mg a day and possible increases.  She has been at higher doses in the past.  She plans to start water therapy for her weakness and for her shoulder.  11/11/2021 appointment with the following noted: Patient received Spravato 84 mg today.  She tolerated it well without any unusual headache, nausea or vomiting or other somatic symptoms.  Dissociation did occur and she gradually saw resolution over the 2-hour period of observation.  She does not typically find the dissociation very strong. No SE complaints with meds. She feels the Spravato is clearly helping the depression.  She would like to see a more significant effect.  She is still having trouble thinking positive. Her energy is fair.  Concentration is good except for the problem with chronic obsessions. She has been taking Wellbutrin 300 mg in Luvox 300 mg for quite some time but has taken higher doses in the past.  We discussed that.  She would like to try higher doses in order to get a better effect if possible. We just increased the doses a couple of days ago.  No effect yet.  11/15/2021 appointment with the following noted: Patient received Spravato 84 mg today.  She tolerated it well without any unusual headache, nausea or vomiting or other somatic symptoms.  Dissociation did occur and she gradually saw resolution over the 2-hour period of observation.  She does not typically find the dissociation very strong. No SE complaints with meds. The patient is now convinced that the Spravato is helping the depression.  She would like to continue twice weekly Spravato this week if possible.  She has tolerated the increase in Wellbutrin to 450 mg daily and the increase and fluvoxamine to 400 mg daily without complications thus far.  The OCD and anxiety feed the depression to  some  extent. She spends approximately 2 hours daily with checking compulsions due to obsessions about causing harm to others.  For example fearing that when she has hit a pot hole that she may have hit a person and going back to check.  Checking corners and rooms out of fear that she may have harmed someone.  Other various checking compulsions.  She is hoping the increase in fluvoxamine to 400 mg will reduce that over the weeks to come.  She is not seeing a significant difference with the addition of the Spravato though she understands that was not expected.  She is more productive at home and more motivated and able to enjoy things more fully as a result of the Spravato treatment.  She is tolerating the medication  11/18/2021 appointment with the following noted: Patient received Spravato 84 mg today.  She tolerated it well without any unusual headache, nausea or vomiting or other somatic symptoms.  Dissociation did occur and she gradually saw resolution over the 2-hour period of observation.  She does not typically find the dissociation very strong. No SE complaints with meds. She clearly believes the Spravato has been helpful for the depression.  She wonders whether to continue to treatments weekly or to cut back to 1 weekly.  She would like to continue to weekly in hopes of getting additional improvement in the depression because it is not resolved but it is difficult to get here twice a week in terms of arranging rides. She is recently increased Wellbutrin XL to 450 mg daily and fluvoxamine to 400 mg daily but they have not had time to have an official effect.  She is tolerating that well.  She is tolerating meds overwork overall well. The OCD remains the same as noted on 11/15/2021  Previous psych med trials include Prozac, paroxetine, sertraline, fluvoxamine, venlafaxine, Anafranil with no response,  Wellbutrin, , Viibryd, Trintellix 10 1 month NR Geodon,  risperidone, Rexulti, Abilify,  Seroquel, Latuda  40 mg with irritability. lamotrigine lithium, BuSpar, Namenda,  pramipexole with no response, and Topamax, pindolol  ECT-MADRS    [facility] Office Visit from 06/29/2021 in [facility] Total Score 36      Flowsheet Row Admission (Discharged) from 06/11/2021 in [facility] No Risk        Review of Systems:  Review of Systems  Constitutional:  Positive for fatigue.  Cardiovascular:  Negative for chest pain and palpitations.  Musculoskeletal:  Positive for arthralgias, back pain and gait problem. Negative for joint swelling.  Neurological:  Positive for weakness. Negative for tremors.   Medications: I have reviewed the patient's current medications.  Current Outpatient Medications  Medication Sig Dispense Refill   Abaloparatide (TYMLOS) 3120 MCG/1.56ML SOPN Inject into the skin.     Azelastine-Fluticasone 137-50 MCG/ACT SUSP Place 1-2 sprays into both nostrils daily.     baclofen (LIORESAL) 10 MG tablet Take 20 mg by mouth at bedtime as needed for muscle spasms.     buPROPion (WELLBUTRIN XL) 150 MG 24 hr tablet Take 3 tablets (450 mg total) by mouth daily. 270 tablet 0   dicyclomine (BENTYL) 10 MG capsule Take 10 mg by mouth daily.     docusate sodium (COLACE) 100 MG capsule Take 1 capsule (100 mg total) by mouth 2 (two) times daily. (Patient taking differently: Take 100 mg by mouth daily.) 10 capsule 0   fexofenadine (ALLEGRA) 180 MG tablet Take 180 mg by mouth daily.  fluvoxaMINE (LUVOX) 100 MG tablet Take 4 tablets (400 mg total) by mouth at bedtime. 360 tablet 0   hydrocortisone (ANUSOL-HC) 2.5 % rectal cream Place rectally 2 (two) times daily. x 7-14 days 30 g 0   ketotifen (ZADITOR) 0.025 % ophthalmic solution Place 3 drops into both eyes at bedtime.     LORazepam (ATIVAN) 1 MG tablet TAKE 2 TABLETS (2 MG TOTAL) BY MOUTH 2 (TWO) TIMES DAILY. 90 tablet 3   magnesium gluconate (MAGONATE) 500 MG tablet Take 500  mg by mouth daily.     MIBELAS 24 FE 1-20 MG-MCG(24) CHEW Chew 1 tablet by mouth at bedtime as needed (bowel regularity).     Multiple Vitamins-Minerals (ADULT GUMMY PO) Take 2 tablets by mouth in the morning.     nitrofurantoin (MACRODANTIN) 100 MG capsule Take 100 mg by mouth as needed (For urinary tract infection.).      oxyCODONE-acetaminophen (PERCOCET/ROXICET) 5-325 MG tablet Take 1-2 tablets by mouth every 6 (six) hours as needed for severe pain. 50 tablet 0   polyethylene glycol (MIRALAX / GLYCOLAX) packet Take 17 g by mouth daily as needed for mild constipation. 14 each 0   psyllium (METAMUCIL) 58.6 % powder Take 1 packet by mouth daily as needed (constipation).     SPRAVATO, 84 MG DOSE, 28 MG/DEVICE SOPK USE NASALLY AS DIRECTED ON PACKAGE LABEL TWICE A WEEK 3 each 3   temazepam (RESTORIL) 30 MG capsule Take 1 capsule (30 mg total) by mouth at bedtime as needed for sleep. 30 capsule 5   Vitamin D-Vitamin K (VITAMIN K2-VITAMIN D3 PO) Take 1-2 sprays by mouth daily.     No current facility-administered medications for this visit.    Medication Side Effects: None   Allergies:  Allergies  Allergen Reactions   Hydrocodone Itching   Sulfamethoxazole-Trimethoprim Itching   Dust Mite Extract Other (See Comments)    Sneezing, watery eyes, runny nose   Latex Itching   Other Other (See Comments)    PT IS ALLERGIC TO CAT DANDER AND RAGWEED - Sneezing, watery eyes, runny nose    Pollen Extract Other (See Comments)    Sneezing, watery eyes, runny nose     Past Medical History:  Diagnosis Date   Abnormal Pap smear 2011   hpv/mild dysplasia,cin1   Anxiety    Cerebral palsy (HCC)    right arm/leg   Cystocele    Depression    Headache    Neuromuscular disorder (HCC)    Cerebral Palsy   OCD (obsessive compulsive disorder)    Osteoporosis    Uterine prolaps     Family History  Problem Relation Age of Onset   Cancer Father        skin AND LUNG   Alcohol abuse Sister         CRACK COCAINE    Social History   Socioeconomic History   Marital status: Married    Spouse name: Not on file   Number of children: Not on file   Years of education: Not on file   Highest education level: Not on file  Occupational History   Not on file  Tobacco Use   Smoking status: Never   Smokeless tobacco: Never  Substance and Sexual Activity   Alcohol use: Not Currently    Comment: OCCASIONAL beer   Drug use: No   Sexual activity: Yes    Birth control/protection: Pill    Comment: LOESTRIN 24 FE  Other Topics Concern   Not on file  Social History Narrative   Not on file   Social Determinants of Health   Financial Resource Strain: Not on file  Food Insecurity: Not on file  Transportation Needs: Not on file  Physical Activity: Not on file  Stress: Not on file  Social Connections: Not on file  Intimate Partner Violence: Not on file    Past Medical History, Surgical history, Social history, and Family history were reviewed and updated as appropriate.   Please see review of systems for further details on the patient's review from today.   Objective:   Physical Exam:  LMP  (LMP Unknown)   Physical Exam Constitutional:      General: She is not in acute distress. Neurological:     Mental Status: She is alert and oriented to person, place, and time.     Cranial Nerves: No dysarthria.     Motor: Weakness present.     Gait: Gait abnormal.  Psychiatric:        Attention and Perception: Attention and perception normal. She does not perceive auditory or visual hallucinations.        Mood and Affect: Mood is anxious and depressed. Affect is not labile, blunt or tearful.        Speech: Speech normal. Speech is not rapid and pressured.        Behavior: Behavior normal. Behavior is not slowed. Behavior is cooperative.        Thought Content: Thought content normal. Thought content is not delusional. Thought content does not include homicidal or suicidal ideation. Thought  content does not include suicidal plan.        Cognition and Memory: Cognition and memory normal. Cognition is not impaired.        Judgment: Judgment normal.     Comments: Insight intact Ongoing OCD remains fairly severe but less anxious Checking compulsions  2 hours daily Chronic depression persistent but better with Spravato     Lab Review:     Component Value Date/Time   NA 138 06/11/2021 0606   K 4.0 06/11/2021 0606   CL 107 06/11/2021 0606   CO2 26 06/11/2021 0606   GLUCOSE 90 06/11/2021 0606   BUN 18 06/11/2021 0606   CREATININE 0.81 06/11/2021 0606   CALCIUM 9.4 06/11/2021 0606   PROT 6.5 06/11/2021 0606   ALBUMIN 3.3 (L) 06/11/2021 0606   AST 17 06/11/2021 0606   ALT 14 06/11/2021 0606   ALKPHOS 141 (H) 06/11/2021 0606   BILITOT 0.2 (L) 06/11/2021 0606   GFRNONAA >60 06/11/2021 0606   GFRAA >60 07/09/2016 0438       Component Value Date/Time   WBC 5.8 06/11/2021 0606   RBC 4.12 06/11/2021 0606   HGB 12.5 06/11/2021 0606   HCT 39.7 06/11/2021 0606   PLT 299 06/11/2021 0606   MCV 96.4 06/11/2021 0606   MCH 30.3 06/11/2021 0606   MCHC 31.5 06/11/2021 0606   RDW 13.9 06/11/2021 0606   LYMPHSABS 1.9 06/11/2021 0606   MONOABS 0.5 06/11/2021 0606   EOSABS 0.1 06/11/2021 0606   BASOSABS 0.0 06/11/2021 0606    No results found for: POCLITH, LITHIUM   No results found for: PHENYTOIN, PHENOBARB, VALPROATE, CBMZ   .res Assessment: Plan:    Jamilynn was seen today for follow-up, depression and anxiety.  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  Impaired.   She is receiving Spravato 84 mg twice weekly and marked improvement in the depression..    The OCD remains severe and ongoing and minimal effect of Spravato so far on OCD.  Spends 2 hours  daily and checking compulsions.  She has been on higher doses of fluvoxamine above the usual max of 400 mg daily in the past.  This became difficult to obtain at 1 point and the dose was reduced to 300 mg daily.   She would like to continue Luvox 400 mg daily again to see if her OCD can be better controlled  She is also been on higher doses of Wellbutrin in the past at 450 mg daily she would like to try increasing that to maximize benefit for depression.  She is tolerating the meds well  Disc alternative for TR sx including Trintellix, Vraylar. Disc using the unusual combo of Trintellix and lower dose Fluvoxamine.   Increased Luvox back to 400 mg nightly as of the third week of January 2023. Consider reevaluating dose.  Disc dosing Increased Wellbutrin XL back to 450 mg every morning for depression as of the third week of January 2023  Disc Spravato DT TRD incl details and SE. Disc dosing and duration.  Pt with severe depression MAD[facility] 36 on9/13/22 Patient was administered Spravato 84 mg intranasally dosage today.  The patient experienced the typical dissociation which gradually resolved over the 2-hour period of observation.  There were no complications.  Specifically the patient did not have nausea or vomiting or headache.  Blood pressures remained within normal ranges at the 40-minute and 2-hour follow-up intervals.  By the time the 2-hour observation period was met the patient was alert and oriented and able to exit without assistance. She tends to have lingering sedative effects but not severe. .  See nursing note for further details. Per protocol will continue Spravato to 84 mg next session.  Will plan twice weekly another week if possible and then reevaluate  We discussed the short-term risks associated with benzodiazepines including sedation and increased fall risk among others.  Discussed long-term side effect risk including dependence, potential withdrawal symptoms, and the potential  eventual dose-related risk of dementia.  But recent studies from 2020 dispute this association between benzodiazepines and dementia risk. Newer studies in 2020 do not support an association with dementia.  Supportive therapy dealing with stressful family interactions lately leading to guilt and tears.  Also her cousin died unexpectedly recently.  Discussed grief and the difference between that and depression.  No other med changes today  Follow-up twice weekly if possible bc uncertain she has received full benefit of twice weekly.   Lynder Parents, MD, DFAPA  Please see After Visit Summary for patient specific instructions.  Future Appointments  Date Time Provider [place]  11/24/2021 10:45 AM Rex Kras, Arthur [facility]  12/06/2021 11:00 AM Blanchie Serve, PhD [facility] None  12/16/2021 11:30 AM Cottle, Billey Co., MD [facility] None  12/20/2021 11:00 AM Blanchie Serve, PhD [facility] None  01/03/2022 11:00 AM Blanchie Serve, PhD [facility] None    No orders of the defined types were placed in this encounter.    -------------------------------

## 2021-11-18 NOTE — Telephone Encounter (Signed)
She is coming in today so I will take care of this

## 2021-11-21 NOTE — Progress Notes (Signed)
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 B/P at 3:15 PM 131/88, 89. Pt instructed to blow her nose and to recline back at 45 degrees. Pt given first nasal spray (28 mg) administered by pt observed by nurse. There were 5 minutes between each dose, total of 84 mg. Tolerated well. Pt's medication is delivered by [facility] in [place] and stored inside a safe behind a locked door as well. Spravato is a CIII medication and has to be only given at a treatment facility and never given to patient.  Pt relaxes in recliner, no complaints voiced. No sedation noted. Pt does experience some dissociation but does clear up prior to discharge. Pt's 40 minute vital signs at 4:00 PM 109/76 85. [name] comes to discuss her care at the end of her treatment when her thoughts are clearer. No complaints of a headache today after her treatment. Discharge vitals at 5:15 PM 125/86, 86. Pt stable for discharge. Pt will start once a week treatments next week. Next treatment is next Thursday, February 9 th at 3:00 PM. Pt was observed on site a total of 120 minutes per FDA/REMS requirements. Pt was with nurse for clinical assessment a total of 55 minutes.    LOT [id number] EXP [id number]

## 2021-11-22 ENCOUNTER — Ambulatory Visit: Payer: 59 | Admitting: Psychiatry

## 2021-11-24 ENCOUNTER — Ambulatory Visit: Payer: 59

## 2021-11-24 ENCOUNTER — Telehealth: Payer: Self-pay | Admitting: Podiatry

## 2021-11-24 ENCOUNTER — Other Ambulatory Visit: Payer: Self-pay

## 2021-11-24 ENCOUNTER — Other Ambulatory Visit: Payer: Self-pay | Admitting: Psychiatry

## 2021-11-24 DIAGNOSIS — R2681 Unsteadiness on feet: Secondary | ICD-10-CM

## 2021-11-24 DIAGNOSIS — M7751 Other enthesopathy of right foot: Secondary | ICD-10-CM

## 2021-11-24 DIAGNOSIS — G809 Cerebral palsy, unspecified: Secondary | ICD-10-CM

## 2021-11-24 NOTE — Telephone Encounter (Signed)
Left message for pt to call to discuss needing the back of her insurance card to call for benefits.

## 2021-11-24 NOTE — Progress Notes (Signed)
Patient seen for evaluation for AFO. Patient is also interested in new foot orthotics. Trialed patient on [place] [facility] and patient reports feeling much more stable and less confined. Her historical brace use was a custom [place] solid AFO from ten years ago. Patient expressed concern regarding financial obligation and would like to submit a pre-certification to determine coverage of L1951 for Rt leg and L3020 bilateral. Patient to be called when precert information obtained. All questions answered and concerns addressed.

## 2021-11-25 ENCOUNTER — Ambulatory Visit (INDEPENDENT_AMBULATORY_CARE_PROVIDER_SITE_OTHER): Payer: 59 | Admitting: Psychiatry

## 2021-11-25 ENCOUNTER — Ambulatory Visit: Payer: 59

## 2021-11-25 VITALS — BP 116/78 | HR 94

## 2021-11-25 DIAGNOSIS — F339 Major depressive disorder, recurrent, unspecified: Secondary | ICD-10-CM

## 2021-11-25 DIAGNOSIS — G809 Cerebral palsy, unspecified: Secondary | ICD-10-CM

## 2021-11-25 DIAGNOSIS — F5105 Insomnia due to other mental disorder: Secondary | ICD-10-CM

## 2021-11-25 DIAGNOSIS — F4010 Social phobia, unspecified: Secondary | ICD-10-CM

## 2021-11-25 DIAGNOSIS — F422 Mixed obsessional thoughts and acts: Secondary | ICD-10-CM

## 2021-11-25 DIAGNOSIS — Z638 Other specified problems related to primary support group: Secondary | ICD-10-CM

## 2021-11-26 NOTE — Progress Notes (Signed)
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 B/P at 3:15 PM 102/60, 95. Pt instructed to blow her nose and to recline back at 45 degrees. Pt given first nasal spray (28 mg) administered by pt observed by nurse. There were 5 minutes between each dose, total of 84 mg. Tolerated well. Pt's medication is delivered by [facility] in [place] and stored inside a safe behind a locked door as well. Spravato is a CIII medication and has to be only given at a treatment facility and never given to patient.  Pt relaxes in recliner, no complaints voiced. No sedation noted. Pt does experience some dissociation but does clear up prior to discharge. Pt's 40 minute vital signs at 3:50 PM 108/72 82. Dr. Clovis Pu comes to discuss her care at the end of her treatment when her thoughts are clearer. No complaints of a headache today after her treatment. Discharge vitals at 5:15 PM 116/78, 94. Pt stable for discharge. This is her first weekly apt. Next treatment is next Thursday, February 16 th at 3:00 PM. Pt was observed on site a total of 120 minutes per FDA/REMS requirements. Pt was with nurse for clinical assessment a total of 55 minutes.    LOT [id number] EXP [id number]

## 2021-11-29 ENCOUNTER — Encounter: Payer: Self-pay | Admitting: Psychiatry

## 2021-11-29 ENCOUNTER — Telehealth: Payer: Self-pay

## 2021-11-29 ENCOUNTER — Other Ambulatory Visit: Payer: Self-pay | Admitting: Psychiatry

## 2021-11-29 NOTE — Telephone Encounter (Signed)
Verification phone number is [phone], as we do not have the back of the card and the pt is not able to scan or take a picture for [place] but she did call and provide the number on the back.

## 2021-11-29 NOTE — Progress Notes (Signed)
Casey Diaz [id number] August 01, 1966 56 y.o.    Subjective:   Patient ID:  Casey Diaz is a 56 y.o. (DOB 11-14-1965) female.  Chief Complaint:  Chief Complaint  Patient presents with   Follow-up   Depression   Anxiety   Fatigue     HPI Casey Diaz presents to the office today for follow-up of OCD and severe anxiety.     December 2019 visit the following was noted: No meds were changed. Lives in [place] and back for followup.  Sx are about the same.  Has to take meds with different sizes. Pt reports that mood is Anxious and Depressed and describes anxiety as Severe. Anxiety symptoms include: Excessive Worry, Obsessive Compulsive Symptoms:   Checking,,. Pt reports has interrupted sleep and nocturia. Pt reports that appetite is good. Pt reports that energy is no change and down slightly. Concentration is down slightly. Suicidal thoughts:  denied by patient. Loves the environment of [place] but misses some things there.  She's not able to work there.  H works there and likes it.  Struggled with not working, feels isolated and not up to task of meeting people.  Does attend a church and met a friend who's been helpful.  Leaving for [place] on 10/16/18.   04/09/2020 appointment the following is noted:  Staying another year in [place] bc Covid and other things. Last few months a lot of crying spells.  Is in menopause. Wonders about med changes though is nervous about it.  Crying spells associated with depressing thoughts more than stress or OCD.   Covid really hard on everyone and couldn't see family for 18 mos.  Family still very dysfunctional. No close friends in part due to OCD and depression. Son high Autism spectrum with ADHD and anxiety and she's with him all the time. Greater health problems with CP so more pains.   05/15/20 appt with the following noted: [name] for menopause and helps some. Still depressed.  Chronically. In [place] for 2 more weeks then to [place] for  another year. A lot of stressors lately triggering more checking and anxiety.   OCD is her CC now and seems.  Got worse DT stress.   Stressed with Asberger's son and her health.  H works a lot.  Her FOO still stress. Plan: Trintellix 10 mg 1 tablet in the morning with food and reduce fluvoxamine to 5 tablets nightly for 1 week  then reduce it to 4 tablets nightly.   07/02/20 appt with the following noted: Decided not to get Trintellix bc difficulty getting it. It is available.  There.  Wants to start it now.   Both depression and OCD are severe.  Not suicidal in intent or plan. Did not take samples with her to [place] but will be back in December. covid is worse there and travel is difficult.  Wants to reduce Wellbutrin DT dry mouth. Plan: She's afraid to reduce Luvox at this time DT fear of worsening OCD.  But will consider. Trintellix 10 mg 1 tablet in the morning with food and reduce fluvoxamine to 5 tablets nightly for 1 week  then reduce it to 4 tablets nightly. Also reduce Wellbutrin XL to 300 mg daily.    9-13 2022 appointment with the following noted: Back in [place] since July 14.  Broke arm a month ago and surgery.  It's all been rough adjustment.   B has cancer on his face and M fell taking him to the doctor.  Misses the water and weather of [place].   Cry a lot more since menopause. Still depression and anxiety and OCD.  Asks about ketamine. On Wellbutrin 300, Luvox 300.  No Trintellix. Added Ativan 2 mg AM and HS and it helps.  More likely to get upset at night. Plan: Increase Luvox back to 400 mg daily.  She thinks she's worse on less. Continue Wellbutrin XL to 300 mg daily. 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  She tolerated it well without unusual nausea or vomiting headache or other somatic symptoms.  She did have the expected dissociation which gradually resolved over  the course of the 2-hour period of observation.  She was a little concerned about her balance given her cerebral palsy but has not noted unusual or unexpected problems.  She is motivated to can continue Spravato in hopes of reducing her depressive symptoms. 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  She also has treatment resistant OCD which is partially managed with medications but is still quite disabling.  She is tolerating the medications well.  She is sleeping adequately.  Her appetite is adequate.  She is not having suicidal thoughts.  She continues to wish for a better treatment for OCD that would give her some relief.  09/30/2021 appointment with the following noted: She received her first dose of Spravato 84 mg intranasally today.  She tolerated it well without unusual nausea, vomiting, or other somatic symptoms.  Dissociation as expected did occur and gradually resolved over the 2-hour period of observation.  She did have a mild headache today with the treatment and received ibuprofen 600 mg at her request.  We will follow this to see if it is a pattern Patient is still depressed.  She said she was late with her medicine today and today was a particularly depressing day.  However she notes that the Spravato has lifted her mood considerably even today.  She is hopeful that it will continue to be helpful.  No suicidal thoughts.  She has ongoing chronic anxiety and OCD at baseline.  10/04/21 appt noted: Patient received Spravato 84 mg for the second time today.  She tolerated it well without any unusual headache, nausea or vomiting or other somatic symptoms.  Dissociation did occur and she gradually [place] resolution over the 2-hour period of observation. She did not have any unusual problems after she left the office last Spravato administration.  She did not have any specific problems with balance or walking.  She is at increased risk of that difficulty because of  cerebral palsy.  So far she has not noticed much mood effect from the medication beyond the first day of receiving it.  However she would like to continue Spravato in hopes of getting the antidepressant effect that is desired. Stress dealing with mother's behavior at party pt hosted.  Guilt over it.  10/07/2021 appointment noted: Patient received Spravato 84 mg for the second time today.  She tolerated it well without any unusual headache, nausea or vomiting or other somatic symptoms.  Dissociation did occur and she gradually [place] resolution over the 2-hour period of observation. She still is not sure about the antidepressant effect of Spravato.  Events over the holidays and demands, make it difficult to assess.  She still notes that the OCD tends to worsen the depression and vice versa.  She tolerates the  Spravato well and wants to continue the trial.  10/15/2021 appointment with the following noted: Patient received Spravato 84 mg for the second time today.  She tolerated it well without any unusual headache, nausea or vomiting or other somatic symptoms.  Dissociation did occur and she gradually [place] resolution over the 2-hour period of observation. Patient says it was somewhat difficult to evaluate the effect of the Spravato.  It was scheduled to be twice weekly for 4 weeks consecutively but the holidays have interfered with that administration.  She asked what specifically should be she should be looking for in order to assess improvement.  That was discussed.  The OCD is unchanged and the depression so far is not significantly different.  She still tolerates meds.  There have been no recent med changes  10/19/2021 appt noted: Patient received Spravato 84 mg for the second today.  She tolerated it well without any unusual headache, nausea or vomiting or other somatic symptoms.  Dissociation did occur and she gradually saw resolution over the 2-hour period of observation.   10/21/2021 appointment  noted: Patient received Spravato 84 mg today.  She tolerated it well without any unusual headache, nausea or vomiting or other somatic symptoms.  Dissociation did occur and she gradually saw resolution over the 2-hour period of observation.  She feels better than last week.  She is not as depressed and down.  She is still dealing with grief around the death of her cousin that was unexpected.  It is still difficult to tell how much the Spravato was doing but she is hopeful.  Anxiety is still present with the OCD.  She is not having suicidal thoughts.  She is not hopeless.  She wants to continue treatment.  10/25/2021 appointment with the following noted: Patient received Spravato 84 mg today.  She tolerated it well without any unusual headache, nausea or vomiting or other somatic symptoms.  Dissociation did occur and she gradually saw resolution over the 2-hour period of observation.  She does not typically find the dissociation very strong. She is beginning to think the Spravato is helping somewhat with the depression.  It has been difficult to tell with the holidays intervening as well as the death of her cousin.  She has not been able to get Spravato twice weekly for 4 weeks straight as typically planned.  However she is hopeful.  The OCD remains significant.  She still has a tendency to think very negatively.  She is not suicidal.  10/28/2021 appointment with the following noted: Patient received Spravato 84 mg today.  She tolerated it well without any unusual headache, nausea or vomiting or other somatic symptoms.  Dissociation did occur and she gradually saw resolution over the 2-hour period of observation.  She does not typically find the dissociation very strong. She is feeling more hopeful about the administration of Spravato.  She is having less depression she believes.  Still not dramatically different.  She still has a tendency to have a lot of anxiety and rumination and OCD.  She is not suicidal.   She is eager to continue the Spravato.  11/01/2021 appointment with the following noted: Patient received Spravato 84 mg today.  She tolerated it well without any unusual headache, nausea or vomiting or other somatic symptoms.  Dissociation did occur and she gradually saw resolution over the 2-hour period of observation.  She does not typically find the dissociation very strong. She is continuing to see a little bit of improvement in depression  with Spravato.  The anxiety remains but may be not as severe.  The OCD remains markedly severe chronically.  She is not suicidal.  She is encouraged by the degree of improvement with Spravato and inability to enjoy things more and not be quite as ruminative.  11/04/2021 appt noted: Patient received Spravato 84 mg today.  She tolerated it well without any unusual headache, nausea or vomiting or other somatic symptoms.  Dissociation did occur and she gradually saw resolution over the 2-hour period of observation.  She does not typically find the dissociation very strong. No SE complaints with meds. She continues to feel hopeful about the Spravato.  She has less depression.  Because of a number of factors she is uncertain of the full benefit but thinks she is somewhat less depressed.  Her anxiety and OCD remain significant but a little better.  She is tolerating the medications and does not desire medicine change.  She is not currently complaining of insomnia.   11/08/2021 appointment the following noted: Patient received Spravato 84 mg today.  She tolerated it well without any unusual headache, nausea or vomiting or other somatic symptoms.  Dissociation did occur and she gradually saw resolution over the 2-hour period of observation.  She does not typically find the dissociation very strong. No SE complaints with meds. She feels the Spravato is helping somewhat.  She would like to see a greater effect.  However she is able to enjoy things.  She is productive at home.   She would like to see a lifting of a degree of sadness that remains.  The anxiety and OCD remained largely unchanged.  She wondered about the dosing of Wellbutrin 300 mg a day and Luvox 300 mg a day and possible increases.  She has been at higher doses in the past.  She plans to start water therapy for her weakness and for her shoulder.  11/11/2021 appointment with the following noted: Patient received Spravato 84 mg today.  She tolerated it well without any unusual headache, nausea or vomiting or other somatic symptoms.  Dissociation did occur and she gradually saw resolution over the 2-hour period of observation.  She does not typically find the dissociation very strong. No SE complaints with meds. She feels the Spravato is clearly helping the depression.  She would like to see a more significant effect.  She is still having trouble thinking positive. Her energy is fair.  Concentration is good except for the problem with chronic obsessions. She has been taking Wellbutrin 300 mg in Luvox 300 mg for quite some time but has taken higher doses in the past.  We discussed that.  She would like to try higher doses in order to get a better effect if possible. We just increased the doses a couple of days ago.  No effect yet.  11/15/2021 appointment with the following noted: Patient received Spravato 84 mg today.  She tolerated it well without any unusual headache, nausea or vomiting or other somatic symptoms.  Dissociation did occur and she gradually saw resolution over the 2-hour period of observation.  She does not typically find the dissociation very strong. No SE complaints with meds. The patient is now convinced that the Spravato is helping the depression.  She would like to continue twice weekly Spravato this week if possible.  She has tolerated the increase in Wellbutrin to 450 mg daily and the increase and fluvoxamine to 400 mg daily without complications thus far.  The OCD and anxiety feed  the  depression to some extent. She spends approximately 2 hours daily with checking compulsions due to obsessions about causing harm to others.  For example fearing that when she has hit a pot hole that she may have hit a person and going back to check.  Checking corners and rooms out of fear that she may have harmed someone.  Other various checking compulsions.  She is hoping the increase in fluvoxamine to 400 mg will reduce that over the weeks to come.  She is not seeing a significant difference with the addition of the Spravato though she understands that was not expected.  She is more productive at home and more motivated and able to enjoy things more fully as a result of the Spravato treatment.  She is tolerating the medication  11/18/2021 appointment with the following noted: Patient received Spravato 84 mg today.  She tolerated it well without any unusual headache, nausea or vomiting or other somatic symptoms.  Dissociation did occur and she gradually saw resolution over the 2-hour period of observation.  She does not typically find the dissociation very strong. No SE complaints with meds. She clearly believes the Spravato has been helpful for the depression.  She wonders whether to continue to treatments weekly or to cut back to 1 weekly.  She would like to continue twice weekly in hopes of getting additional improvement in the depression because it is not resolved but it is difficult to get here twice a week in terms of arranging rides. She is recently increased Wellbutrin XL to 450 mg daily and fluvoxamine to 400 mg daily but they have not had time to have an official effect.  She is tolerating that well.  She is tolerating meds overwork overall well. The OCD remains the same as noted on 11/15/2021  11/25/21 appt noted: Patient received Spravato 84 mg today.  She tolerated it well without any unusual headache, nausea or vomiting or other somatic symptoms.  Dissociation did occur and she gradually saw  resolution over the 2-hour period of observation.  She does not typically find the dissociation very strong. No SE complaints with meds. She thinks the increase in Wellbutrin and Luvox have been potentially helpful for depression and OCD respectively.  It has been too early to see the full effect.  She is sleeping and eating well.  She is functioning at home.  She still spends a lot of time that is about 2 hours a day dealing with compulsive behaviors.  Previous psych med trials include Prozac, paroxetine, sertraline, fluvoxamine, venlafaxine, Anafranil with no response,  Wellbutrin, , Viibryd, Trintellix 10 1 month NR Geodon,  risperidone, Rexulti, Abilify,  Seroquel, Latuda 40 mg with irritability. lamotrigine lithium, BuSpar, Namenda,  pramipexole with no response, and Topamax, pindolol  ECT-MADRS    [facility] Office Visit from 06/29/2021 in [facility] Total Score 36      Flowsheet Row Admission (Discharged) from 06/11/2021 in [facility] No Risk        Review of Systems:  Review of Systems  Constitutional:  Positive for fatigue.  Respiratory:  Negative for shortness of breath.   Cardiovascular:  Negative for chest pain and palpitations.  Musculoskeletal:  Positive for arthralgias, back pain and gait problem. Negative for joint swelling.  Neurological:  Positive for weakness. Negative for tremors.   Medications: I have reviewed the patient's current medications.  Current Outpatient Medications  Medication Sig Dispense Refill   Abaloparatide (  TYMLOS) 3120 MCG/1.56ML SOPN Inject into the skin.     Azelastine-Fluticasone 137-50 MCG/ACT SUSP Place 1-2 sprays into both nostrils daily.     baclofen (LIORESAL) 10 MG tablet Take 20 mg by mouth at bedtime as needed for muscle spasms.     buPROPion (WELLBUTRIN XL) 150 MG 24 hr tablet Take 3 tablets (450 mg total) by mouth daily. 270 tablet 0   dicyclomine (BENTYL) 10 MG  capsule Take 10 mg by mouth daily.     docusate sodium (COLACE) 100 MG capsule Take 1 capsule (100 mg total) by mouth 2 (two) times daily. (Patient taking differently: Take 100 mg by mouth daily.) 10 capsule 0   fexofenadine (ALLEGRA) 180 MG tablet Take 180 mg by mouth daily.     fluvoxaMINE (LUVOX) 100 MG tablet Take 4 tablets (400 mg total) by mouth at bedtime. 360 tablet 0   hydrocortisone (ANUSOL-HC) 2.5 % rectal cream Place rectally 2 (two) times daily. x 7-14 days 30 g 0   ketotifen (ZADITOR) 0.025 % ophthalmic solution Place 3 drops into both eyes at bedtime.     LORazepam (ATIVAN) 1 MG tablet TAKE 2 TABLETS (2 MG TOTAL) BY MOUTH 2 (TWO) TIMES DAILY. 90 tablet 3   magnesium gluconate (MAGONATE) 500 MG tablet Take 500 mg by mouth daily.     MIBELAS 24 FE 1-20 MG-MCG(24) CHEW Chew 1 tablet by mouth at bedtime as needed (bowel regularity).     Multiple Vitamins-Minerals (ADULT GUMMY PO) Take 2 tablets by mouth in the morning.     nitrofurantoin (MACRODANTIN) 100 MG capsule Take 100 mg by mouth as needed (For urinary tract infection.).      oxyCODONE-acetaminophen (PERCOCET/ROXICET) 5-325 MG tablet Take 1-2 tablets by mouth every 6 (six) hours as needed for severe pain. 50 tablet 0   polyethylene glycol (MIRALAX / GLYCOLAX) packet Take 17 g by mouth daily as needed for mild constipation. 14 each 0   psyllium (METAMUCIL) 58.6 % powder Take 1 packet by mouth daily as needed (constipation).     SPRAVATO, 84 MG DOSE, 28 MG/DEVICE SOPK USE NASALLY AS DIRECTED ON PACKAGE LABEL TWICE A WEEK 3 each 0   temazepam (RESTORIL) 30 MG capsule Take 1 capsule (30 mg total) by mouth at bedtime as needed for sleep. 30 capsule 5   Vitamin D-Vitamin K (VITAMIN K2-VITAMIN D3 PO) Take 1-2 sprays by mouth daily.     No current facility-administered medications for this visit.    Medication Side Effects: None   Allergies:  Allergies  Allergen Reactions   Hydrocodone Itching   Sulfamethoxazole-Trimethoprim  Itching   Dust Mite Extract Other (See Comments)    Sneezing, watery eyes, runny nose   Latex Itching   Other Other (See Comments)    PT IS ALLERGIC TO CAT DANDER AND RAGWEED - Sneezing, watery eyes, runny nose    Pollen Extract Other (See Comments)    Sneezing, watery eyes, runny nose     Past Medical History:  Diagnosis Date   Abnormal Pap smear 2011   hpv/mild dysplasia,cin1   Anxiety    Cerebral palsy (HCC)    right arm/leg   Cystocele    Depression    Headache    Neuromuscular disorder (HCC)    Cerebral Palsy   OCD (obsessive compulsive disorder)    Osteoporosis    Uterine prolaps     Family History  Problem Relation Age of Onset   Cancer Father        skin  AND LUNG   Alcohol abuse Sister        CRACK COCAINE    Social History   Socioeconomic History   Marital status: Married    Spouse name: Not on file   Number of children: Not on file   Years of education: Not on file   Highest education level: Not on file  Occupational History   Not on file  Tobacco Use   Smoking status: Never   Smokeless tobacco: Never  Substance and Sexual Activity   Alcohol use: Not Currently    Comment: OCCASIONAL beer   Drug use: No   Sexual activity: Yes    Birth control/protection: Pill    Comment: LOESTRIN 24 FE  Other Topics Concern   Not on file  Social History Narrative   Not on file   Social Determinants of Health   Financial Resource Strain: Not on file  Food Insecurity: Not on file  Transportation Needs: Not on file  Physical Activity: Not on file  Stress: Not on file  Social Connections: Not on file  Intimate Partner Violence: Not on file    Past Medical History, Surgical history, Social history, and Family history were reviewed and updated as appropriate.   Please see review of systems for further details on the patient's review from today.   Objective:   Physical Exam:  LMP  (LMP Unknown)   Physical Exam Constitutional:      General: She is  not in acute distress. Neurological:     Mental Status: She is alert and oriented to person, place, and time.     Cranial Nerves: No dysarthria.     Motor: Weakness present.     Gait: Gait abnormal.  Psychiatric:        Attention and Perception: Attention and perception normal. She does not perceive auditory or visual hallucinations.        Mood and Affect: Mood is anxious and depressed. Affect is not labile, blunt or tearful.        Speech: Speech normal. Speech is not rapid and pressured.        Behavior: Behavior normal. Behavior is cooperative.        Thought Content: Thought content normal. Thought content is not delusional. Thought content does not include homicidal or suicidal ideation. Thought content does not include suicidal plan.        Cognition and Memory: Cognition and memory normal. Cognition is not impaired.        Judgment: Judgment normal.     Comments: Insight intact Ongoing OCD remains fairly severe but less anxious Checking compulsions  2 hours daily Chronic depression persistent but better with Spravato     Lab Review:     Component Value Date/Time   NA 138 06/11/2021 0606   K 4.0 06/11/2021 0606   CL 107 06/11/2021 0606   CO2 26 06/11/2021 0606   GLUCOSE 90 06/11/2021 0606   BUN 18 06/11/2021 0606   CREATININE 0.81 06/11/2021 0606   CALCIUM 9.4 06/11/2021 0606   PROT 6.5 06/11/2021 0606   ALBUMIN 3.3 (L) 06/11/2021 0606   AST 17 06/11/2021 0606   ALT 14 06/11/2021 0606   ALKPHOS 141 (H) 06/11/2021 0606   BILITOT 0.2 (L) 06/11/2021 0606   GFRNONAA >60 06/11/2021 0606   GFRAA >60 07/09/2016 0438       Component Value Date/Time   WBC 5.8 06/11/2021 0606   RBC 4.12 06/11/2021 0606   HGB 12.5 06/11/2021 0606  HCT 39.7 06/11/2021 0606   PLT 299 06/11/2021 0606   MCV 96.4 06/11/2021 0606   MCH 30.3 06/11/2021 0606   MCHC 31.5 06/11/2021 0606   RDW 13.9 06/11/2021 0606   LYMPHSABS 1.9 06/11/2021 0606   MONOABS 0.5 06/11/2021 0606   EOSABS 0.1  06/11/2021 0606   BASOSABS 0.0 06/11/2021 0606    No results found for: POCLITH, LITHIUM   No results found for: PHENYTOIN, PHENOBARB, VALPROATE, CBMZ   .res Assessment: Plan:    Casey Diaz was seen today for follow-up, depression, anxiety and fatigue.  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  Impaired.   She is receiving Spravato 84 mg twice weekly and marked improvement in the depression..    The OCD remains severe and ongoing and minimal effect of Spravato so far on OCD.  Spends 2 hours daily and checking compulsions.  She has been on higher doses of fluvoxamine above the usual max of 400 mg daily in the past.  This became difficult to obtain at 1 point and the dose was reduced to 300 mg daily.   She would like to continue Luvox 400 mg daily again to see if her OCD can be better controlled  She is also been on higher doses of Wellbutrin in the past at 450 mg daily she would like to try increasing that to maximize benefit for depression.  She is tolerating the meds well  Disc alternative for TR sx including Trintellix, Vraylar. Disc using the unusual combo of Trintellix and lower dose Fluvoxamine.   Increased Luvox back to 400 mg nightly as of the third week of January 2023. Consider reevaluating dose.  Disc dosing Increased Wellbutrin XL back to 450 mg every morning for depression as of the third week of January 2023  Disc Spravato DT TRD incl details and SE. Disc dosing and duration.  Pt with severe depression MADRS 36 on9/13/22 Patient was administered Spravato 84 mg intranasally dosage today.  The patient experienced the typical dissociation which gradually resolved over the 2-hour period of observation.  There were no  complications.  Specifically the patient did not have nausea or vomiting or headache.  Blood pressures remained within normal ranges at the 40-minute and 2-hour follow-up intervals.  By the time the 2-hour observation period was met the patient was alert and oriented and able to exit without assistance. She tends to have lingering sedative effects but not severe. .  See nursing note for further details. Per protocol will continue Spravato to 84 mg next session.  Will plan twice weekly another week if possible and then reevaluate  We discussed the short-term risks associated with benzodiazepines including sedation and increased fall risk among others.  Discussed long-term side effect risk including dependence, potential withdrawal symptoms, and the potential eventual dose-related risk of dementia.  But recent studies from 2020 dispute this association between benzodiazepines and dementia risk. Newer studies in 2020 do not support an association with dementia.  Supportive therapy dealing with stressful family interactions lately leading to guilt and tears.  Also her cousin died unexpectedly recently.  Discussed grief and the difference between that and depression.  No med changes indicated.  Follow-up twice weekly if possible bc uncertain she has received full benefit of  twice weekly.   Lynder Parents, MD, DFAPA  Please see After Visit Summary for patient specific instructions.  Future Appointments  Date Time Provider Daisy  12/06/2021 11:00 AM Blanchie Serve, PhD [facility] None  12/16/2021 11:30 AM Cottle, Billey Co., MD [facility] None  12/20/2021 11:00 AM Blanchie Serve, PhD [facility] None  01/03/2022 11:00 AM Blanchie Serve, PhD [facility] None    No orders of the defined types were placed in this encounter.    -------------------------------

## 2021-12-02 ENCOUNTER — Ambulatory Visit (INDEPENDENT_AMBULATORY_CARE_PROVIDER_SITE_OTHER): Payer: 59 | Admitting: Psychiatry

## 2021-12-02 ENCOUNTER — Ambulatory Visit: Payer: 59

## 2021-12-02 ENCOUNTER — Encounter: Payer: Self-pay | Admitting: Psychiatry

## 2021-12-02 ENCOUNTER — Other Ambulatory Visit: Payer: Self-pay

## 2021-12-02 VITALS — BP 129/80 | HR 78

## 2021-12-02 DIAGNOSIS — F339 Major depressive disorder, recurrent, unspecified: Secondary | ICD-10-CM

## 2021-12-02 DIAGNOSIS — F5105 Insomnia due to other mental disorder: Secondary | ICD-10-CM | POA: Diagnosis not present

## 2021-12-02 DIAGNOSIS — F422 Mixed obsessional thoughts and acts: Secondary | ICD-10-CM | POA: Diagnosis not present

## 2021-12-02 DIAGNOSIS — G809 Cerebral palsy, unspecified: Secondary | ICD-10-CM

## 2021-12-02 DIAGNOSIS — F4010 Social phobia, unspecified: Secondary | ICD-10-CM | POA: Diagnosis not present

## 2021-12-02 NOTE — Progress Notes (Signed)
Casey Diaz [id number] 1966-05-06 56 y.o.    Subjective:   Patient ID:  Casey Diaz is a 56 y.o. (DOB 01/03/1966) female.  Chief Complaint:  Chief Complaint  Patient presents with   Follow-up   Depression   Anxiety   Stress     HPI Casey Diaz presents to the office today for follow-up of OCD and severe anxiety.     December 2019 visit the following was noted: No meds were changed. Lives in [name] and back for followup.  Sx are about the same.  Has to take meds with different sizes. Pt reports that mood is Anxious and Depressed and describes anxiety as Severe. Anxiety symptoms include: Excessive Worry, Obsessive Compulsive Symptoms:   Checking,,. Pt reports has interrupted sleep and nocturia. Pt reports that appetite is good. Pt reports that energy is no change and down slightly. Concentration is down slightly. Suicidal thoughts:  denied by patient. Loves the environment of [name] but misses some things there.  She's not able to work there.  H works there and likes it.  Struggled with not working, feels isolated and not up to task of meeting people.  Does attend a church and met a friend who's been helpful.  Leaving for [name] on 10/16/18.   04/09/2020 appointment the following is noted:  Staying another year in [name] bc Covid and other things. Last few months a lot of crying spells.  Is in menopause. Wonders about med changes though is nervous about it.  Crying spells associated with depressing thoughts more than stress or OCD.   Covid really hard on everyone and couldn't see family for 18 mos.  Family still very dysfunctional. No close friends in part due to OCD and depression. Son high Autism spectrum with ADHD and anxiety and she's with him all the time. Greater health problems with CP so more pains.   05/15/20 appt with the following noted: Casey Diaz for menopause and helps some. Still depressed.  Chronically. In [place] for 2 more weeks then to [name] for  another year. A lot of stressors lately triggering more checking and anxiety.   OCD is her CC now and seems.  Got worse DT stress.   Stressed with Asberger's son and her health.  H works a lot.  Her FOO still stress. Plan: Trintellix 10 mg 1 tablet in the morning with food and reduce fluvoxamine to 5 tablets nightly for 1 week  then reduce it to 4 tablets nightly.   07/02/20 appt with the following noted: Decided not to get Trintellix bc difficulty getting it. It is available.  There.  Wants to start it now.   Both depression and OCD are severe.  Not suicidal in intent or plan. Did not take samples with her to [name] but will be back in December. covid is worse there and travel is difficult.  Wants to reduce Wellbutrin DT dry mouth. Plan: She's afraid to reduce Luvox at this time DT fear of worsening OCD.  But will consider. Trintellix 10 mg 1 tablet in the morning with food and reduce fluvoxamine to 5 tablets nightly for 1 week  then reduce it to 4 tablets nightly. Also reduce Wellbutrin XL to 300 mg daily.    9-13 2022 appointment with the following noted: Back in [place] since July 14.  Broke arm a month ago and surgery.  It's all been rough adjustment.   B has cancer on his face and M fell taking him to the doctor.  Misses the water and weather of [place].   Cry a lot more since menopause. Still depression and anxiety and OCD.  Asks about ketamine. On Wellbutrin 300, Luvox 300.  No Trintellix. Added Ativan 2 mg AM and HS and it helps.  More likely to get upset at night. Plan: Increase Luvox back to 400 mg daily.  She thinks she's worse on less. Continue Wellbutrin XL to 300 mg daily. 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  She tolerated it well without unusual nausea or vomiting headache or other somatic symptoms.  She did have the expected dissociation which gradually resolved over  the course of the 2-hour period of observation.  She was a little concerned about her balance given her cerebral palsy but has not noted unusual or unexpected problems.  She is motivated to can continue Spravato in hopes of reducing her depressive symptoms. 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  She also has treatment resistant OCD which is partially managed with medications but is still quite disabling.  She is tolerating the medications well.  She is sleeping adequately.  Her appetite is adequate.  She is not having suicidal thoughts.  She continues to wish for a better treatment for OCD that would give her some relief.  09/30/2021 appointment with the following noted: She received her first dose of Spravato 84 mg intranasally today.  She tolerated it well without unusual nausea, vomiting, or other somatic symptoms.  Dissociation as expected did occur and gradually resolved over the 2-hour period of observation.  She did have a mild headache today with the treatment and received ibuprofen 600 mg at her request.  We will follow this to see if it is a pattern Patient is still depressed.  She said she was late with her medicine today and today was a particularly depressing day.  However she notes that the Spravato has lifted her mood considerably even today.  She is hopeful that it will continue to be helpful.  No suicidal thoughts.  She has ongoing chronic anxiety and OCD at baseline.  10/04/21 appt noted: Patient received Spravato 84 mg for the second time today.  She tolerated it well without any unusual headache, nausea or vomiting or other somatic symptoms.  Dissociation did occur and she gradually [place] resolution over the 2-hour period of observation. She did not have any unusual problems after she left the office last Spravato administration.  She did not have any specific problems with balance or walking.  She is at increased risk of that difficulty because of  cerebral palsy.  So far she has not noticed much mood effect from the medication beyond the first day of receiving it.  However she would like to continue Spravato in hopes of getting the antidepressant effect that is desired. Stress dealing with mother's behavior at party pt hosted.  Guilt over it.  10/07/2021 appointment noted: Patient received Spravato 84 mg for the second time today.  She tolerated it well without any unusual headache, nausea or vomiting or other somatic symptoms.  Dissociation did occur and she gradually [place] resolution over the 2-hour period of observation. She still is not sure about the antidepressant effect of Spravato.  Events over the holidays and demands, make it difficult to assess.  She still notes that the OCD tends to worsen the depression and vice versa.  She tolerates the  Spravato well and wants to continue the trial.  10/15/2021 appointment with the following noted: Patient received Spravato 84 mg for the second time today.  She tolerated it well without any unusual headache, nausea or vomiting or other somatic symptoms.  Dissociation did occur and she gradually [place] resolution over the 2-hour period of observation. Patient says it was somewhat difficult to evaluate the effect of the Spravato.  It was scheduled to be twice weekly for 4 weeks consecutively but the holidays have interfered with that administration.  She asked what specifically should be she should be looking for in order to assess improvement.  That was discussed.  The OCD is unchanged and the depression so far is not significantly different.  She still tolerates meds.  There have been no recent med changes  10/19/2021 appt noted: Patient received Spravato 84 mg for the second today.  She tolerated it well without any unusual headache, nausea or vomiting or other somatic symptoms.  Dissociation did occur and she gradually saw resolution over the 2-hour period of observation.   10/21/2021 appointment  noted: Patient received Spravato 84 mg today.  She tolerated it well without any unusual headache, nausea or vomiting or other somatic symptoms.  Dissociation did occur and she gradually saw resolution over the 2-hour period of observation.  She feels better than last week.  She is not as depressed and down.  She is still dealing with grief around the death of her cousin that was unexpected.  It is still difficult to tell how much the Spravato was doing but she is hopeful.  Anxiety is still present with the OCD.  She is not having suicidal thoughts.  She is not hopeless.  She wants to continue treatment.  10/25/2021 appointment with the following noted: Patient received Spravato 84 mg today.  She tolerated it well without any unusual headache, nausea or vomiting or other somatic symptoms.  Dissociation did occur and she gradually saw resolution over the 2-hour period of observation.  She does not typically find the dissociation very strong. She is beginning to think the Spravato is helping somewhat with the depression.  It has been difficult to tell with the holidays intervening as well as the death of her cousin.  She has not been able to get Spravato twice weekly for 4 weeks straight as typically planned.  However she is hopeful.  The OCD remains significant.  She still has a tendency to think very negatively.  She is not suicidal.  10/28/2021 appointment with the following noted: Patient received Spravato 84 mg today.  She tolerated it well without any unusual headache, nausea or vomiting or other somatic symptoms.  Dissociation did occur and she gradually saw resolution over the 2-hour period of observation.  She does not typically find the dissociation very strong. She is feeling more hopeful about the administration of Spravato.  She is having less depression she believes.  Still not dramatically different.  She still has a tendency to have a lot of anxiety and rumination and OCD.  She is not suicidal.   She is eager to continue the Spravato.  11/01/2021 appointment with the following noted: Patient received Spravato 84 mg today.  She tolerated it well without any unusual headache, nausea or vomiting or other somatic symptoms.  Dissociation did occur and she gradually saw resolution over the 2-hour period of observation.  She does not typically find the dissociation very strong. She is continuing to see a little bit of improvement in depression  with Spravato.  The anxiety remains but may be not as severe.  The OCD remains markedly severe chronically.  She is not suicidal.  She is encouraged by the degree of improvement with Spravato and inability to enjoy things more and not be quite as ruminative.  11/04/2021 appt noted: Patient received Spravato 84 mg today.  She tolerated it well without any unusual headache, nausea or vomiting or other somatic symptoms.  Dissociation did occur and she gradually saw resolution over the 2-hour period of observation.  She does not typically find the dissociation very strong. No SE complaints with meds. She continues to feel hopeful about the Spravato.  She has less depression.  Because of a number of factors she is uncertain of the full benefit but thinks she is somewhat less depressed.  Her anxiety and OCD remain significant but a little better.  She is tolerating the medications and does not desire medicine change.  She is not currently complaining of insomnia.   11/08/2021 appointment the following noted: Patient received Spravato 84 mg today.  She tolerated it well without any unusual headache, nausea or vomiting or other somatic symptoms.  Dissociation did occur and she gradually saw resolution over the 2-hour period of observation.  She does not typically find the dissociation very strong. No SE complaints with meds. She feels the Spravato is helping somewhat.  She would like to see a greater effect.  However she is able to enjoy things.  She is productive at home.   She would like to see a lifting of a degree of sadness that remains.  The anxiety and OCD remained largely unchanged.  She wondered about the dosing of Wellbutrin 300 mg a day and Luvox 300 mg a day and possible increases.  She has been at higher doses in the past.  She plans to start water therapy for her weakness and for her shoulder.  11/11/2021 appointment with the following noted: Patient received Spravato 84 mg today.  She tolerated it well without any unusual headache, nausea or vomiting or other somatic symptoms.  Dissociation did occur and she gradually saw resolution over the 2-hour period of observation.  She does not typically find the dissociation very strong. No SE complaints with meds. She feels the Spravato is clearly helping the depression.  She would like to see a more significant effect.  She is still having trouble thinking positive. Her energy is fair.  Concentration is good except for the problem with chronic obsessions. She has been taking Wellbutrin 300 mg in Luvox 300 mg for quite some time but has taken higher doses in the past.  We discussed that.  She would like to try higher doses in order to get a better effect if possible. We just increased the doses a couple of days ago.  No effect yet.  11/15/2021 appointment with the following noted: Patient received Spravato 84 mg today.  She tolerated it well without any unusual headache, nausea or vomiting or other somatic symptoms.  Dissociation did occur and she gradually saw resolution over the 2-hour period of observation.  She does not typically find the dissociation very strong. No SE complaints with meds. The patient is now convinced that the Spravato is helping the depression.  She would like to continue twice weekly Spravato this week if possible.  She has tolerated the increase in Wellbutrin to 450 mg daily and the increase and fluvoxamine to 400 mg daily without complications thus far.  The OCD and anxiety feed  the  depression to some extent. She spends approximately 2 hours daily with checking compulsions due to obsessions about causing harm to others.  For example fearing that when she has hit a pot hole that she may have hit a person and going back to check.  Checking corners and rooms out of fear that she may have harmed someone.  Other various checking compulsions.  She is hoping the increase in fluvoxamine to 400 mg will reduce that over the weeks to come.  She is not seeing a significant difference with the addition of the Spravato though she understands that was not expected.  She is more productive at home and more motivated and able to enjoy things more fully as a result of the Spravato treatment.  She is tolerating the medication  11/18/2021 appointment with the following noted: Patient received Spravato 84 mg today.  She tolerated it well without any unusual headache, nausea or vomiting or other somatic symptoms.  Dissociation did occur and she gradually saw resolution over the 2-hour period of observation.  She does not typically find the dissociation very strong. No SE complaints with meds. She clearly believes the Spravato has been helpful for the depression.  She wonders whether to continue to treatments weekly or to cut back to 1 weekly.  She would like to continue twice weekly in hopes of getting additional improvement in the depression because it is not resolved but it is difficult to get here twice a week in terms of arranging rides. She is recently increased Wellbutrin XL to 450 mg daily and fluvoxamine to 400 mg daily but they have not had time to have an official effect.  She is tolerating that well.  She is tolerating meds overwork overall well. The OCD remains the same as noted on 11/15/2021  11/25/21 appt noted: Patient received Spravato 84 mg today.  She tolerated it well without any unusual headache, nausea or vomiting or other somatic symptoms.  Dissociation did occur and she gradually saw  resolution over the 2-hour period of observation.  She does not typically find the dissociation very strong. No SE complaints with meds. She thinks the increase in Wellbutrin and Luvox have been potentially helpful for depression and OCD respectively.  It has been too early to see the full effect.  She is sleeping and eating well.  She is functioning at home.  She still spends a lot of time that is about 2 hours a day dealing with compulsive behaviors.  12/02/21 appt noted: Patient received Spravato 84 mg today.  She tolerated it well without any unusual headache, nausea or vomiting or other somatic symptoms.  Dissociation did occur and she gradually saw resolution over the 2-hour period of observation.  She does not typically find the dissociation very strong. No SE complaints with meds. Several losses and stressors recently that affect her sense of mood. However still sees significant benefit from the Spravato for her depression.  Wants to continue it. Suspect early  some benefit from the increased Wellbutrin for depression and Luvox for OCD. Tolerating meds. No complaints about the meds. Sleeping and eating well.  No new health concerns.  Previous psych med trials include Prozac, paroxetine, sertraline, fluvoxamine, venlafaxine, Anafranil with no response,  Wellbutrin, , Viibryd, Trintellix 10 1 month NR Geodon,  risperidone, Rexulti, Abilify,  Seroquel, Latuda 40 mg with irritability. lamotrigine lithium, BuSpar, Namenda,  pramipexole with no response, and Topamax, pindolol  ECT-MADRS    [place] Office Visit from 06/29/2021 in  [facility]  MADRS Total Score 36      Flowsheet Row Admission (Discharged) from 06/11/2021 in [facility] No Risk        Review of Systems:  Review of Systems  Constitutional:  Positive for fatigue.  Cardiovascular:  Negative for chest pain and palpitations.  Musculoskeletal:  Positive for  arthralgias, back pain and gait problem. Negative for joint swelling.  Neurological:  Positive for weakness. Negative for tremors.   Medications: I have reviewed the patient's current medications.  Current Outpatient Medications  Medication Sig Dispense Refill   Abaloparatide (TYMLOS) 3120 MCG/1.56ML SOPN Inject into the skin.     Azelastine-Fluticasone 137-50 MCG/ACT SUSP Place 1-2 sprays into both nostrils daily.     baclofen (LIORESAL) 10 MG tablet Take 20 mg by mouth at bedtime as needed for muscle spasms.     buPROPion (WELLBUTRIN XL) 150 MG 24 hr tablet Take 3 tablets (450 mg total) by mouth daily. 270 tablet 0   dicyclomine (BENTYL) 10 MG capsule Take 10 mg by mouth daily.     docusate sodium (COLACE) 100 MG capsule Take 1 capsule (100 mg total) by mouth 2 (two) times daily. (Patient taking differently: Take 100 mg by mouth daily.) 10 capsule 0   fexofenadine (ALLEGRA) 180 MG tablet Take 180 mg by mouth daily.     fluvoxaMINE (LUVOX) 100 MG tablet Take 4 tablets (400 mg total) by mouth at bedtime. 360 tablet 0   hydrocortisone (ANUSOL-HC) 2.5 % rectal cream Place rectally 2 (two) times daily. x 7-14 days 30 g 0   ketotifen (ZADITOR) 0.025 % ophthalmic solution Place 3 drops into both eyes at bedtime.     LORazepam (ATIVAN) 1 MG tablet TAKE 2 TABLETS (2 MG TOTAL) BY MOUTH 2 (TWO) TIMES DAILY. 90 tablet 3   magnesium gluconate (MAGONATE) 500 MG tablet Take 500 mg by mouth daily.     MIBELAS 24 FE 1-20 MG-MCG(24) CHEW Chew 1 tablet by mouth at bedtime as needed (bowel regularity).     Multiple Vitamins-Minerals (ADULT GUMMY PO) Take 2 tablets by mouth in the morning.     nitrofurantoin (MACRODANTIN) 100 MG capsule Take 100 mg by mouth as needed (For urinary tract infection.).      oxyCODONE-acetaminophen (PERCOCET/ROXICET) 5-325 MG tablet Take 1-2 tablets by mouth every 6 (six) hours as needed for severe pain. 50 tablet 0   polyethylene glycol (MIRALAX / GLYCOLAX) packet Take 17 g by mouth  daily as needed for mild constipation. 14 each 0   psyllium (METAMUCIL) 58.6 % powder Take 1 packet by mouth daily as needed (constipation).     SPRAVATO, 84 MG DOSE, 28 MG/DEVICE SOPK USE NASALLY AS DIRECTED ON PACKAGE LABEL TWICE A WEEK 3 each 0   temazepam (RESTORIL) 30 MG capsule Take 1 capsule (30 mg total) by mouth at bedtime as needed for sleep. 30 capsule 5   Vitamin D-Vitamin K (VITAMIN K2-VITAMIN D3 PO) Take 1-2 sprays by mouth daily.     No current facility-administered medications for this visit.    Medication Side Effects: None   Allergies:  Allergies  Allergen Reactions   Hydrocodone Itching   Sulfamethoxazole-Trimethoprim Itching   Dust Mite Extract Other (See Comments)    Sneezing, watery eyes, runny nose   Latex Itching   Other Other (See Comments)    PT IS ALLERGIC TO CAT DANDER AND RAGWEED - Sneezing, watery eyes, runny nose    Pollen Extract Other (  See Comments)    Sneezing, watery eyes, runny nose     Past Medical History:  Diagnosis Date   Abnormal Pap smear 2011   hpv/mild dysplasia,cin1   Anxiety    Cerebral palsy (HCC)    right arm/leg   Cystocele    Depression    Headache    Neuromuscular disorder (HCC)    Cerebral Palsy   OCD (obsessive compulsive disorder)    Osteoporosis    Uterine prolaps     Family History  Problem Relation Age of Onset   Cancer Father        skin AND LUNG   Alcohol abuse Sister        CRACK COCAINE    Social History   Socioeconomic History   Marital status: Married    Spouse name: Not on file   Number of children: Not on file   Years of education: Not on file   Highest education level: Not on file  Occupational History   Not on file  Tobacco Use   Smoking status: Never   Smokeless tobacco: Never  Substance and Sexual Activity   Alcohol use: Not Currently    Comment: OCCASIONAL beer   Drug use: No   Sexual activity: Yes    Birth control/protection: Pill    Comment: LOESTRIN 24 FE  Other Topics  Concern   Not on file  Social History Narrative   Not on file   Social Determinants of Health   Financial Resource Strain: Not on file  Food Insecurity: Not on file  Transportation Needs: Not on file  Physical Activity: Not on file  Stress: Not on file  Social Connections: Not on file  Intimate Partner Violence: Not on file    Past Medical History, Surgical history, Social history, and Family history were reviewed and updated as appropriate.   Please see review of systems for further details on the patient's review from today.   Objective:   Physical Exam:  LMP  (LMP Unknown)   Physical Exam Constitutional:      General: She is not in acute distress. Neurological:     Mental Status: She is alert and oriented to person, place, and time.     Cranial Nerves: No dysarthria.     Motor: Weakness present.     Gait: Gait abnormal.  Psychiatric:        Attention and Perception: Attention and perception normal. She does not perceive auditory or visual hallucinations.        Mood and Affect: Mood is anxious and depressed. Affect is not labile or tearful.        Speech: Speech normal. Speech is not rapid and pressured.        Behavior: Behavior normal. Behavior is cooperative.        Thought Content: Thought content normal. Thought content is not delusional. Thought content does not include homicidal or suicidal ideation. Thought content does not include suicidal plan.        Cognition and Memory: Cognition and memory normal. Cognition is not impaired.        Judgment: Judgment normal.     Comments: Insight intact Pleasant. Ongoing OCD remains fairly severe but less anxious Checking compulsions  2 hours daily Chronic depression persistent but better with Spravato     Lab Review:     Component Value Date/Time   NA 138 06/11/2021 0606   K 4.0 06/11/2021 0606   CL 107 06/11/2021 0606   CO2 26 06/11/2021  0606   GLUCOSE 90 06/11/2021 0606   BUN 18 06/11/2021 0606   CREATININE  0.81 06/11/2021 0606   CALCIUM 9.4 06/11/2021 0606   PROT 6.5 06/11/2021 0606   ALBUMIN 3.3 (L) 06/11/2021 0606   AST 17 06/11/2021 0606   ALT 14 06/11/2021 0606   ALKPHOS 141 (H) 06/11/2021 0606   BILITOT 0.2 (L) 06/11/2021 0606   GFRNONAA >60 06/11/2021 0606   GFRAA >60 07/09/2016 0438       Component Value Date/Time   WBC 5.8 06/11/2021 0606   RBC 4.12 06/11/2021 0606   HGB 12.5 06/11/2021 0606   HCT 39.7 06/11/2021 0606   PLT 299 06/11/2021 0606   MCV 96.4 06/11/2021 0606   MCH 30.3 06/11/2021 0606   MCHC 31.5 06/11/2021 0606   RDW 13.9 06/11/2021 0606   LYMPHSABS 1.9 06/11/2021 0606   MONOABS 0.5 06/11/2021 0606   EOSABS 0.1 06/11/2021 0606   BASOSABS 0.0 06/11/2021 0606    No results found for: POCLITH, LITHIUM   No results found for: PHENYTOIN, PHENOBARB, VALPROATE, CBMZ   .res Assessment: Plan:    Cia was seen today for follow-up, depression, anxiety and stress.  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  Impaired.   She is receiving Spravato 84 mg twice weekly and marked improvement in the depression..    The OCD remains severe and ongoing and minimal effect of Spravato so far on OCD.  Spends 2 hours daily and checking compulsions.  She has been on higher doses of fluvoxamine above the usual max of 400 mg daily in the past.  This became difficult to obtain at 1 point and the dose was reduced to 300 mg daily.   She would like to continue Luvox 400 mg daily again to see if her OCD can be better controlled   She is tolerating the meds well  Increased Luvox back to 400 mg nightly as of the third week of January 2023. Consider reevaluating dose.  Disc dosing.  Sehe feels this is starting to help more with OCD which remains chronically  severe. Increased Wellbutrin XL back to 450 mg every morning for depression as of the third week of January 2023  Disc Spravato DT TRD incl details and SE. Disc dosing and duration.  Pt with severe depression MADRS 36 on9/13/22 Patient was administered Spravato 84 mg intranasally dosage today.  The patient experienced the typical dissociation which gradually resolved over the 2-hour period of observation.  There were no complications.  Specifically the patient did not have nausea or vomiting or headache.  Blood pressures remained within normal ranges at the 40-minute and 2-hour follow-up intervals.  By the time the 2-hour observation period was met the patient was alert and oriented and able to exit without assistance. She tends to have lingering sedative effects but not severe. .  See nursing note for further details. Per protocol will continue Spravato to 84 mg next session.  Will plan twice weekly another week if possible and then reevaluate  We discussed the short-term risks associated with benzodiazepines including sedation and increased fall risk among others.  Discussed long-term side effect risk including dependence, potential withdrawal symptoms, and the potential eventual dose-related risk of dementia.  But recent studies from 2020 dispute this association between  benzodiazepines and dementia risk. Newer studies in 2020 do not support an association with dementia.  No med changes indicated.  Follow-up weekly    Casey Parents, MD, DFAPA  Please see After Visit Summary for patient specific instructions.  Future Appointments  Date Time Provider [place]  12/06/2021 11:00 AM Blanchie Serve, PhD [facility] None  12/15/2021 10:30 AM Vedia Pereyra, PT [facility]  12/16/2021 11:30 AM Cottle, Billey Co., MD [facility] None  12/20/2021 11:00 AM Blanchie Serve, PhD [facility] None  01/03/2022 11:00 AM Blanchie Serve, PhD [facility] None    No orders of the defined types were placed in this  encounter.    -------------------------------

## 2021-12-03 NOTE — Progress Notes (Signed)
NURSE Visit:   Pt arrived for her 41 th Spravato treatment, today she will get 84 mg (3 of the 28 mg), which is the maintenance dose for her treatment resistant depression, she was directed to the treatment room to get vitals taken first. B/P at 3:20 PM 120/68, 80. Pt instructed to blow her nose and to recline back at 45 degrees. Pt given first nasal spray (28 mg) administered by pt observed by nurse. There were 5 minutes between each dose, total of 84 mg. Tolerated well. Pt's medication is delivered by [facility] in [place] and stored inside a safe behind a locked door as well. Spravato is a CIII medication and has to be only given at a treatment facility and never given to patient.  Pt relaxes in recliner, no complaints voiced. No sedation noted but pt did have the lights off and listening to music on the TV. Pt does experience some dissociation but does clear up prior to discharge. Pt's 40 minute vital signs at 4:00 PM 125/80 78. [name] comes to discuss her care at the end of her treatment when her thoughts are clearer. No complaints of a headache today after her treatment. Discharge vitals at 5:30 PM 129/80, 78. Pt stable for discharge. Next treatment is next Thursday, February 23 rd at 3:00 PM. Pt was observed on site a total of 120 minutes per FDA/REMS requirements. Pt was with nurse for clinical assessment a total of 55 minutes.    LOT [id number]

## 2021-12-06 ENCOUNTER — Ambulatory Visit: Payer: 59 | Admitting: Psychiatry

## 2021-12-06 ENCOUNTER — Other Ambulatory Visit: Payer: Self-pay | Admitting: Psychiatry

## 2021-12-07 ENCOUNTER — Other Ambulatory Visit: Payer: Self-pay

## 2021-12-07 MED ORDER — SPRAVATO (84 MG DOSE) 28 MG/DEVICE NA SOPK: PACK | NASAL | 5 refills | Status: DC

## 2021-12-09 ENCOUNTER — Other Ambulatory Visit: Payer: Self-pay

## 2021-12-09 ENCOUNTER — Ambulatory Visit: Payer: 59

## 2021-12-09 ENCOUNTER — Ambulatory Visit (INDEPENDENT_AMBULATORY_CARE_PROVIDER_SITE_OTHER): Payer: 59 | Admitting: Psychiatry

## 2021-12-09 VITALS — BP 106/71 | HR 83

## 2021-12-09 DIAGNOSIS — F422 Mixed obsessional thoughts and acts: Secondary | ICD-10-CM | POA: Diagnosis not present

## 2021-12-09 DIAGNOSIS — F339 Major depressive disorder, recurrent, unspecified: Secondary | ICD-10-CM

## 2021-12-09 DIAGNOSIS — G809 Cerebral palsy, unspecified: Secondary | ICD-10-CM

## 2021-12-09 DIAGNOSIS — F4010 Social phobia, unspecified: Secondary | ICD-10-CM | POA: Diagnosis not present

## 2021-12-09 DIAGNOSIS — F5105 Insomnia due to other mental disorder: Secondary | ICD-10-CM

## 2021-12-09 MED ORDER — FLUVOXAMINE MALEATE 100 MG PO TABS: 400.0000 mg | ORAL_TABLET | Freq: Every day | ORAL | 0 refills | Status: DC

## 2021-12-11 ENCOUNTER — Encounter: Payer: Self-pay | Admitting: Psychiatry

## 2021-12-11 NOTE — Progress Notes (Signed)
Casey Diaz [id number] [birth year] 56 y.o.    Subjective:   Patient ID:  Casey Diaz is a 56 y.o. (DOB Feb 28, 1966) female.  Chief Complaint:  Chief Complaint  Patient presents with   Follow-up   Depression   Anxiety     HPI Casey Diaz presents to the office today for follow-up of OCD and severe anxiety.     December 2019 visit the following was noted: No meds were changed. Lives in [place] and back for followup.  Sx are about the same.  Has to take meds with different sizes. Pt reports that mood is Anxious and Depressed and describes anxiety as Severe. Anxiety symptoms include: Excessive Worry, Obsessive Compulsive Symptoms:   Checking,,. Pt reports has interrupted sleep and nocturia. Pt reports that appetite is good. Pt reports that energy is no change and down slightly. Concentration is down slightly. Suicidal thoughts:  denied by patient. Loves the environment of [place] but misses some things there.  She's not able to work there.  H works there and likes it.  Struggled with not working, feels isolated and not up to task of meeting people.  Does attend a church and met a friend who's been helpful.  Leaving for [place] on 10/16/18.   04/09/2020 appointment the following is noted:  Staying another year in [place] bc Covid and other things. Last few months a lot of crying spells.  Is in menopause. Wonders about med changes though is nervous about it.  Crying spells associated with depressing thoughts more than stress or OCD.   Covid really hard on everyone and couldn't see family for 18 mos.  Family still very dysfunctional. No close friends in part due to OCD and depression. Son high Autism spectrum with ADHD and anxiety and she's with him all the time. Greater health problems with CP so more pains.   05/15/20 appt with the following noted: [name] for menopause and helps some. Still depressed.  Chronically. In [place] for 2 more weeks then to [place] for another  year. A lot of stressors lately triggering more checking and anxiety.   OCD is her CC now and seems.  Got worse DT stress.   Stressed with Asberger's son and her health.  H works a lot.  Her FOO still stress. Plan: Trintellix 10 mg 1 tablet in the morning with food and reduce fluvoxamine to 5 tablets nightly for 1 week  then reduce it to 4 tablets nightly.   07/02/20 appt with the following noted: Decided not to get Trintellix bc difficulty getting it. It is available.  There.  Wants to start it now.   Both depression and OCD are severe.  Not suicidal in intent or plan. Did not take samples with her to [place] but will be back in December. covid is worse there and travel is difficult.  Wants to reduce Wellbutrin DT dry mouth. Plan: She's afraid to reduce Luvox at this time DT fear of worsening OCD.  But will consider. Trintellix 10 mg 1 tablet in the morning with food and reduce fluvoxamine to 5 tablets nightly for 1 week  then reduce it to 4 tablets nightly. Also reduce Wellbutrin XL to 300 mg daily.    9-13 2022 appointment with the following noted: Back in [place] since July 14.  Broke arm a month ago and surgery.  It's all been rough adjustment.   B has cancer on his face and M fell taking him to the doctor.  Misses the water  and weather of [place].   Cry a lot more since menopause. Still depression and anxiety and OCD.  Asks about ketamine. On Wellbutrin 300, Luvox 300.  No Trintellix. Added Ativan 2 mg AM and HS and it helps.  More likely to get upset at night. Plan: Increase Luvox back to 400 mg daily.  She thinks she's worse on less. Continue Wellbutrin XL to 300 mg daily. 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  She tolerated it well without unusual nausea or vomiting headache or other somatic symptoms.  She did have the expected dissociation which gradually resolved over the course  of the 2-hour period of observation.  She was a little concerned about her balance given her cerebral palsy but has not noted unusual or unexpected problems.  She is motivated to can continue Spravato in hopes of reducing her depressive symptoms. 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  She also has treatment resistant OCD which is partially managed with medications but is still quite disabling.  She is tolerating the medications well.  She is sleeping adequately.  Her appetite is adequate.  She is not having suicidal thoughts.  She continues to wish for a better treatment for OCD that would give her some relief.  09/30/2021 appointment with the following noted: She received her first dose of Spravato 84 mg intranasally today.  She tolerated it well without unusual nausea, vomiting, or other somatic symptoms.  Dissociation as expected did occur and gradually resolved over the 2-hour period of observation.  She did have a mild headache today with the treatment and received ibuprofen 600 mg at her request.  We will follow this to see if it is a pattern Patient is still depressed.  She said she was late with her medicine today and today was a particularly depressing day.  However she notes that the Spravato has lifted her mood considerably even today.  She is hopeful that it will continue to be helpful.  No suicidal thoughts.  She has ongoing chronic anxiety and OCD at baseline.  10/04/21 appt noted: Patient received Spravato 84 mg for the second time today.  She tolerated it well without any unusual headache, nausea or vomiting or other somatic symptoms.  Dissociation did occur and she gradually [place] resolution over the 2-hour period of observation. She did not have any unusual problems after she left the office last Spravato administration.  She did not have any specific problems with balance or walking.  She is at increased risk of that difficulty because of cerebral  palsy.  So far she has not noticed much mood effect from the medication beyond the first day of receiving it.  However she would like to continue Spravato in hopes of getting the antidepressant effect that is desired. Stress dealing with mother's behavior at party pt hosted.  Guilt over it.  10/07/2021 appointment noted: Patient received Spravato 84 mg for the second time today.  She tolerated it well without any unusual headache, nausea or vomiting or other somatic symptoms.  Dissociation did occur and she gradually [place] resolution over the 2-hour period of observation. She still is not sure about the antidepressant effect of Spravato.  Events over the holidays and demands, make it difficult to assess.  She still notes that the OCD tends to worsen the depression and vice versa.  She tolerates the Spravato well and  wants to continue the trial.  10/15/2021 appointment with the following noted: Patient received Spravato 84 mg for the second time today.  She tolerated it well without any unusual headache, nausea or vomiting or other somatic symptoms.  Dissociation did occur and she gradually [place] resolution over the 2-hour period of observation. Patient says it was somewhat difficult to evaluate the effect of the Spravato.  It was scheduled to be twice weekly for 4 weeks consecutively but the holidays have interfered with that administration.  She asked what specifically should be she should be looking for in order to assess improvement.  That was discussed.  The OCD is unchanged and the depression so far is not significantly different.  She still tolerates meds.  There have been no recent med changes  10/19/2021 appt noted: Patient received Spravato 84 mg for the second today.  She tolerated it well without any unusual headache, nausea or vomiting or other somatic symptoms.  Dissociation did occur and she gradually saw resolution over the 2-hour period of observation.   10/21/2021 appointment noted: Patient  received Spravato 84 mg today.  She tolerated it well without any unusual headache, nausea or vomiting or other somatic symptoms.  Dissociation did occur and she gradually saw resolution over the 2-hour period of observation.  She feels better than last week.  She is not as depressed and down.  She is still dealing with grief around the death of her cousin that was unexpected.  It is still difficult to tell how much the Spravato was doing but she is hopeful.  Anxiety is still present with the OCD.  She is not having suicidal thoughts.  She is not hopeless.  She wants to continue treatment.  10/25/2021 appointment with the following noted: Patient received Spravato 84 mg today.  She tolerated it well without any unusual headache, nausea or vomiting or other somatic symptoms.  Dissociation did occur and she gradually saw resolution over the 2-hour period of observation.  She does not typically find the dissociation very strong. She is beginning to think the Spravato is helping somewhat with the depression.  It has been difficult to tell with the holidays intervening as well as the death of her cousin.  She has not been able to get Spravato twice weekly for 4 weeks straight as typically planned.  However she is hopeful.  The OCD remains significant.  She still has a tendency to think very negatively.  She is not suicidal.  10/28/2021 appointment with the following noted: Patient received Spravato 84 mg today.  She tolerated it well without any unusual headache, nausea or vomiting or other somatic symptoms.  Dissociation did occur and she gradually saw resolution over the 2-hour period of observation.  She does not typically find the dissociation very strong. She is feeling more hopeful about the administration of Spravato.  She is having less depression she believes.  Still not dramatically different.  She still has a tendency to have a lot of anxiety and rumination and OCD.  She is not suicidal.  She is eager to  continue the Spravato.  11/01/2021 appointment with the following noted: Patient received Spravato 84 mg today.  She tolerated it well without any unusual headache, nausea or vomiting or other somatic symptoms.  Dissociation did occur and she gradually saw resolution over the 2-hour period of observation.  She does not typically find the dissociation very strong. She is continuing to see a little bit of improvement in depression with Spravato.  The anxiety remains but may be not as severe.  The OCD remains markedly severe chronically.  She is not suicidal.  She is encouraged by the degree of improvement with Spravato and inability to enjoy things more and not be quite as ruminative.  11/04/2021 appt noted: Patient received Spravato 84 mg today.  She tolerated it well without any unusual headache, nausea or vomiting or other somatic symptoms.  Dissociation did occur and she gradually saw resolution over the 2-hour period of observation.  She does not typically find the dissociation very strong. No SE complaints with meds. She continues to feel hopeful about the Spravato.  She has less depression.  Because of a number of factors she is uncertain of the full benefit but thinks she is somewhat less depressed.  Her anxiety and OCD remain significant but a little better.  She is tolerating the medications and does not desire medicine change.  She is not currently complaining of insomnia.   11/08/2021 appointment the following noted: Patient received Spravato 84 mg today.  She tolerated it well without any unusual headache, nausea or vomiting or other somatic symptoms.  Dissociation did occur and she gradually saw resolution over the 2-hour period of observation.  She does not typically find the dissociation very strong. No SE complaints with meds. She feels the Spravato is helping somewhat.  She would like to see a greater effect.  However she is able to enjoy things.  She is productive at home.  She would like  to see a lifting of a degree of sadness that remains.  The anxiety and OCD remained largely unchanged.  She wondered about the dosing of Wellbutrin 300 mg a day and Luvox 300 mg a day and possible increases.  She has been at higher doses in the past.  She plans to start water therapy for her weakness and for her shoulder.  11/11/2021 appointment with the following noted: Patient received Spravato 84 mg today.  She tolerated it well without any unusual headache, nausea or vomiting or other somatic symptoms.  Dissociation did occur and she gradually saw resolution over the 2-hour period of observation.  She does not typically find the dissociation very strong. No SE complaints with meds. She feels the Spravato is clearly helping the depression.  She would like to see a more significant effect.  She is still having trouble thinking positive. Her energy is fair.  Concentration is good except for the problem with chronic obsessions. She has been taking Wellbutrin 300 mg in Luvox 300 mg for quite some time but has taken higher doses in the past.  We discussed that.  She would like to try higher doses in order to get a better effect if possible. We just increased the doses a couple of days ago.  No effect yet.  11/15/2021 appointment with the following noted: Patient received Spravato 84 mg today.  She tolerated it well without any unusual headache, nausea or vomiting or other somatic symptoms.  Dissociation did occur and she gradually saw resolution over the 2-hour period of observation.  She does not typically find the dissociation very strong. No SE complaints with meds. The patient is now convinced that the Spravato is helping the depression.  She would like to continue twice weekly Spravato this week if possible.  She has tolerated the increase in Wellbutrin to 450 mg daily and the increase and fluvoxamine to 400 mg daily without complications thus far.  The OCD and anxiety feed the depression to  some  extent. She spends approximately 2 hours daily with checking compulsions due to obsessions about causing harm to others.  For example fearing that when she has hit a pot hole that she may have hit a person and going back to check.  Checking corners and rooms out of fear that she may have harmed someone.  Other various checking compulsions.  She is hoping the increase in fluvoxamine to 400 mg will reduce that over the weeks to come.  She is not seeing a significant difference with the addition of the Spravato though she understands that was not expected.  She is more productive at home and more motivated and able to enjoy things more fully as a result of the Spravato treatment.  She is tolerating the medication  11/18/2021 appointment with the following noted: Patient received Spravato 84 mg today.  She tolerated it well without any unusual headache, nausea or vomiting or other somatic symptoms.  Dissociation did occur and she gradually saw resolution over the 2-hour period of observation.  She does not typically find the dissociation very strong. No SE complaints with meds. She clearly believes the Spravato has been helpful for the depression.  She wonders whether to continue to treatments weekly or to cut back to 1 weekly.  She would like to continue twice weekly in hopes of getting additional improvement in the depression because it is not resolved but it is difficult to get here twice a week in terms of arranging rides. She is recently increased Wellbutrin XL to 450 mg daily and fluvoxamine to 400 mg daily but they have not had time to have an official effect.  She is tolerating that well.  She is tolerating meds overwork overall well. The OCD remains the same as noted on 11/15/2021  11/25/21 appt noted: Patient received Spravato 84 mg today.  She tolerated it well without any unusual headache, nausea or vomiting or other somatic symptoms.  Dissociation did occur and she gradually saw resolution over the  2-hour period of observation.  She does not typically find the dissociation very strong. No SE complaints with meds. She thinks the increase in Wellbutrin and Luvox have been potentially helpful for depression and OCD respectively.  It has been too early to see the full effect.  She is sleeping and eating well.  She is functioning at home.  She still spends a lot of time that is about 2 hours a day dealing with compulsive behaviors.  12/02/21 appt noted: Patient received Spravato 84 mg today.  She tolerated it well without any unusual headache, nausea or vomiting or other somatic symptoms.  Dissociation did occur and she gradually saw resolution over the 2-hour period of observation.  She does not typically find the dissociation very strong. No SE complaints with meds. Several losses and stressors recently that affect her sense of mood. However still sees significant benefit from the Spravato for her depression.  Wants to continue it. Suspect early  some benefit from the increased Wellbutrin for depression and Luvox for OCD. Tolerating meds. No complaints about the meds. Sleeping and eating well.  No new health concerns.  12/09/21 appt noted: Patient received Spravato 84 mg today.  She tolerated it well without any unusual headache, nausea or vomiting or other somatic symptoms.  Dissociation did occur and she gradually saw resolution over the 2-hour period of observation.  She does not typically find the dissociation very strong. No SE complaints with meds. Seeing noticeable improvement from increase fluvoxamine to  400 mg daily.  Tolerating meds without concerns over them. Depression is stable with residual sx of easy guilt and easily stressed.  OCD contributes to depression but depression is not severe with less crying spells.  Productive at home with chores.  Enjoyed recent birthday.  Sleeping good. No new concerns.  Previous psych med trials include Prozac, paroxetine, sertraline, fluvoxamine,  venlafaxine, Anafranil with no response,  Wellbutrin, , Viibryd, Trintellix 10 1 month NR Geodon,  risperidone, Rexulti, Abilify,  Seroquel, Latuda 40 mg with irritability. lamotrigine lithium, BuSpar, Namenda,  pramipexole with no response, and Topamax, pindolol  ECT-MADRS    [place] Office Visit from 06/29/2021 in [facility] Total Score 36      Flowsheet Row Admission (Discharged) from 06/11/2021 in [facility] No [facility]        Review of Systems:  Review of Systems  Constitutional:  Positive for fatigue. Negative for diaphoresis.  Cardiovascular:  Negative for chest pain and palpitations.  Musculoskeletal:  Positive for arthralgias, back pain and gait problem. Negative for joint swelling.  Neurological:  Positive for weakness. Negative for tremors.   Medications: I have reviewed the patient's current medications.  Current Outpatient Medications  Medication Sig Dispense Refill   Abaloparatide (TYMLOS) 3120 MCG/1.56ML SOPN Inject into the skin.     Azelastine-Fluticasone 137-50 MCG/ACT SUSP Place 1-2 sprays into both nostrils daily.     baclofen (LIORESAL) 10 MG tablet Take 20 mg by mouth at bedtime as needed for muscle spasms.     buPROPion (WELLBUTRIN XL) 150 MG 24 hr tablet Take 3 tablets (450 mg total) by mouth daily. 270 tablet 0   dicyclomine (BENTYL) 10 MG capsule Take 10 mg by mouth daily.     docusate sodium (COLACE) 100 MG capsule Take 1 capsule (100 mg total) by mouth 2 (two) times daily. (Patient taking differently: Take 100 mg by mouth daily.) 10 capsule 0   Esketamine HCl, 84 MG Dose, (SPRAVATO, 84 MG DOSE,) 28 MG/DEVICE SOPK Use 84 mg ( 3 of the 28 mg ) inhalers intranasally as directed twice a week. 3 each 5   fexofenadine (ALLEGRA) 180 MG tablet Take 180 mg by mouth daily.     fluvoxaMINE (LUVOX) 100 MG tablet Take 4 tablets (400 mg total) by mouth at bedtime. 360 tablet 0   hydrocortisone  (ANUSOL-HC) 2.5 % rectal cream Place rectally 2 (two) times daily. x 7-14 days 30 g 0   ketotifen (ZADITOR) 0.025 % ophthalmic solution Place 3 drops into both eyes at bedtime.     LORazepam (ATIVAN) 1 MG tablet TAKE 2 TABLETS (2 MG TOTAL) BY MOUTH 2 (TWO) TIMES DAILY. 90 tablet 3   magnesium gluconate (MAGONATE) 500 MG tablet Take 500 mg by mouth daily.     MIBELAS 24 FE 1-20 MG-MCG(24) CHEW Chew 1 tablet by mouth at bedtime as needed (bowel regularity).     Multiple Vitamins-Minerals (ADULT GUMMY PO) Take 2 tablets by mouth in the morning.     nitrofurantoin (MACRODANTIN) 100 MG capsule Take 100 mg by mouth as needed (For urinary tract infection.).      oxyCODONE-acetaminophen (PERCOCET/ROXICET) 5-325 MG tablet Take 1-2 tablets by mouth every 6 (six) hours as needed for severe pain. 50 tablet 0   polyethylene glycol (MIRALAX / GLYCOLAX) packet Take 17 g by mouth daily as needed for mild constipation. 14 each 0   psyllium (METAMUCIL) 58.6 % powder Take 1 packet by mouth daily  as needed (constipation).     temazepam (RESTORIL) 30 MG capsule Take 1 capsule (30 mg total) by mouth at bedtime as needed for sleep. 30 capsule 5   Vitamin D-Vitamin K (VITAMIN K2-VITAMIN D3 PO) Take 1-2 sprays by mouth daily.     No current facility-administered medications for this visit.    Medication Side Effects: None   Allergies:  Allergies  Allergen Reactions   Hydrocodone Itching   Sulfamethoxazole-Trimethoprim Itching   Dust Mite Extract Other (See Comments)    Sneezing, watery eyes, runny nose   Latex Itching   Other Other (See Comments)    PT IS ALLERGIC TO CAT DANDER AND RAGWEED - Sneezing, watery eyes, runny nose    Pollen Extract Other (See Comments)    Sneezing, watery eyes, runny nose     Past Medical History:  Diagnosis Date   Abnormal Pap smear 2011   hpv/mild dysplasia,cin1   Anxiety    Cerebral palsy (HCC)    right arm/leg   Cystocele    Depression    Headache    Neuromuscular  disorder (HCC)    Cerebral Palsy   OCD (obsessive compulsive disorder)    Osteoporosis    Uterine prolaps     Family History  Problem Relation Age of Onset   Cancer Father        skin AND LUNG   Alcohol abuse Sister        CRACK COCAINE    Social History   Socioeconomic History   Marital status: Married    Spouse name: Not on file   Number of children: Not on file   Years of education: Not on file   Highest education level: Not on file  Occupational History   Not on file  Tobacco Use   Smoking status: Never   Smokeless tobacco: Never  Substance and Sexual Activity   Alcohol use: Not Currently    Comment: OCCASIONAL beer   Drug use: No   Sexual activity: Yes    Birth control/protection: Pill    Comment: LOESTRIN 24 FE  Other Topics Concern   Not on file  Social History Narrative   Not on file   Social Determinants of Health   Financial Resource Strain: Not on file  Food Insecurity: Not on file  Transportation Needs: Not on file  Physical Activity: Not on file  Stress: Not on file  Social Connections: Not on file  Intimate Partner Violence: Not on file    Past Medical History, Surgical history, Social history, and Family history were reviewed and updated as appropriate.   Please see review of systems for further details on the patient's review from today.   Objective:   Physical Exam:  LMP  (LMP Unknown)   Physical Exam Constitutional:      General: She is not in acute distress. Neurological:     Mental Status: She is alert and oriented to person, place, and time.     Cranial Nerves: No dysarthria.     Motor: Weakness present.     Gait: Gait abnormal.  Psychiatric:        Attention and Perception: Attention and perception normal. She does not perceive auditory or visual hallucinations.        Mood and Affect: Mood is anxious and depressed. Affect is not labile or tearful.        Speech: Speech normal. Speech is not rapid and pressured.         Behavior: Behavior normal. Behavior  is cooperative.        Thought Content: Thought content normal. Thought content is not delusional. Thought content does not include homicidal or suicidal ideation. Thought content does not include suicidal plan.        Cognition and Memory: Cognition and memory normal. Cognition is not impaired.        Judgment: Judgment normal.     Comments: Insight intact Pleasant. Ongoing OCD remains fairly severe but less anxious Checking compulsions  2 hours daily but improving noticeably Chronic depression persistent but better with Spravato     Lab Review:     Component Value Date/Time   NA 138 06/11/2021 0606   K 4.0 06/11/2021 0606   CL 107 06/11/2021 0606   CO2 26 06/11/2021 0606   GLUCOSE 90 06/11/2021 0606   BUN 18 06/11/2021 0606   CREATININE 0.81 06/11/2021 0606   CALCIUM 9.4 06/11/2021 0606   PROT 6.5 06/11/2021 0606   ALBUMIN 3.3 (L) 06/11/2021 0606   AST 17 06/11/2021 0606   ALT 14 06/11/2021 0606   ALKPHOS 141 (H) 06/11/2021 0606   BILITOT 0.2 (L) 06/11/2021 0606   GFRNONAA >60 06/11/2021 0606   GFRAA >60 07/09/2016 0438       Component Value Date/Time   WBC 5.8 06/11/2021 0606   RBC 4.12 06/11/2021 0606   HGB 12.5 06/11/2021 0606   HCT 39.7 06/11/2021 0606   PLT 299 06/11/2021 0606   MCV 96.4 06/11/2021 0606   MCH 30.3 06/11/2021 0606   MCHC 31.5 06/11/2021 0606   RDW 13.9 06/11/2021 0606   LYMPHSABS 1.9 06/11/2021 0606   MONOABS 0.5 06/11/2021 0606   EOSABS 0.1 06/11/2021 0606   BASOSABS 0.0 06/11/2021 0606    No results found for: POCLITH, LITHIUM   No results found for: PHENYTOIN, PHENOBARB, VALPROATE, CBMZ   .res Assessment: Plan:    Adamary was seen today for follow-up, depression and anxiety.  Diagnoses and all orders for this visit:  Recurrent major depression resistant to treatment [facility])  Mixed obsessional thoughts and acts  Social anxiety disorder  Insomnia due to mental condition  Congenital cerebral  palsy ([place])    Both Dx are TR and marked.  Impaired function but less so with Spravato re: depression..   She is receiving Spravato 84 mg weekly and marked improvement in the depression..    The OCD remains severe and ongoing and minimal effect of Spravato so far on OCD.  Spends 2 hours daily and checking compulsions.  She has been on higher doses of fluvoxamine above the usual max of 400 mg daily in the past.  This became difficult to obtain at 1 point and the dose was reduced to 300 mg daily.   Disc SE. She would like to continue Luvox 400 mg daily again to see if her OCD can be better controlled   She is tolerating the meds well  Increased Luvox back to 400 mg nightly as of the third week of January 2023. Consider reevaluating dose.  Disc dosing.  Sehe feels this is starting to help more with OCD which remains chronically severe. Increased Wellbutrin XL back to 450 mg every morning for depression as of the third week of January 2023  Disc Spravato DT TRD incl details and SE. Disc dosing and duration.  Pt with severe depression MADRS 36 on9/13/22 Patient was administered Spravato 84 mg intranasally dosage today.  The patient experienced the typical dissociation which gradually resolved over the 2-hour period of observation.  There were no complications.  Specifically the patient did not have nausea or vomiting or headache.  Blood pressures remained within normal ranges at the 40-minute and 2-hour follow-up intervals.  By the time the 2-hour observation period was met the patient was alert and oriented and able to exit without assistance. She tends to have lingering sedative effects but not severe. .  See nursing note for further details. Per protocol will continue Spravato to 84 mg next session.  Will plan twice weekly another week if possible and then reevaluate  We discussed the short-term risks associated with benzodiazepines including sedation and increased fall risk among others.   Discussed long-term side effect risk including dependence, potential withdrawal symptoms, and the potential eventual dose-related risk of dementia.  But recent studies from 2020 dispute this association between benzodiazepines and dementia risk. Newer studies in 2020 do not support an association with dementia.  No med changes indicated.  Follow-up weekly    Lynder Parents, MD, DFAPA  Please see After Visit Summary for patient specific instructions.  Future Appointments  Date Time Provider [place]  12/15/2021 10:30 AM Vedia Pereyra, PT [facility]  12/16/2021 11:30 AM Cottle, Billey Co., MD CP[facility] None  12/20/2021 11:00 AM Blanchie Serve, PhD CP[facility]CP None  01/03/2022 11:00 AM Blanchie Serve, PhD CP[facility]CP None    No orders of the defined types were placed in this encounter.    -------------------------------

## 2021-12-14 ENCOUNTER — Other Ambulatory Visit: Payer: Self-pay | Admitting: Psychiatry

## 2021-12-14 DIAGNOSIS — F339 Major depressive disorder, recurrent, unspecified: Secondary | ICD-10-CM

## 2021-12-14 NOTE — Progress Notes (Signed)
NURSE Visit:   Pt arrived for her 71 th Spravato treatment, today she will get 84 mg (3 of the 28 mg), which is the maintenance dose for her treatment resistant depression, she was directed to the treatment room to get vitals taken first. B/P at 3:10 PM 105/71, 102. Pt instructed to blow her nose and to recline back at 45 degrees. Pt given first nasal spray (28 mg) administered by pt observed by nurse. There were 5 minutes between each dose, total of 84 mg. Tolerated well. Pt's medication is delivered by [facility] in [place] and stored inside a safe behind a locked door as well. Spravato is a CIII medication and has to be only given at a treatment facility and never given to patient.  Pt relaxes in recliner, no complaints voiced. No sedation noted but pt did have the lights off and listening to music on the TV. Pt does experience some dissociation but does clear up prior to discharge. Pt's 40 minute vital signs at 4:50 PM 113/76 86. [name] comes to discuss her care at the end of her treatment when her thoughts are clearer. No complaints of a headache today after her treatment. Discharge vitals at 5:10 PM 106/71, 83. Pt stable for discharge. Next treatment is the following Thursday, March 2 nd.  Pt was observed on site a total of 120 minutes per FDA/REMS requirements. Pt was with nurse for clinical assessment a total of 55 minutes.    LOT [id number] EXP [id number]

## 2021-12-14 NOTE — Therapy (Signed)
OUTPATIENT PHYSICAL THERAPY THORACOLUMBAR EVALUATION   Patient Name: Casey Diaz MRN: [id number] DOB:02-Oct-1966, 56 y.o., female Today's Date: 12/15/2021   PT End of Session - 12/15/21 1323     Visit Number 1    Number of Visits 12    Date for PT Re-Evaluation 01/26/22    Progress Note Due on Visit 10    PT Start Time 1030    PT Stop Time 1115    PT Time Calculation (min) 45 min    Activity Tolerance Patient tolerated treatment well    Behavior During Therapy [facility] for tasks assessed/performed             Past Medical History:  Diagnosis Date   Abnormal Pap smear 2011   hpv/mild dysplasia,cin1   Anxiety    Cerebral palsy (HCC)    right arm/leg   Cystocele    Depression    Headache    Neuromuscular disorder (HCC)    Cerebral Palsy   OCD (obsessive compulsive disorder)    Osteoporosis    Uterine prolaps    Past Surgical History:  Procedure Laterality Date   COLPOSCOPY  2011   ELBOW SURGERY     left elbow-separation of bones -age 46   ELBOW SURGERY  2015   fell on ice- right elbow fracture   FRACTURE SURGERY     HAMMER TOE SURGERY  1/13   RIGHT SIDE   HIP PINNING,CANNULATED Left 07/08/2016   Procedure: LEFT CLOSED REDUCTION HIP AND PERCUTANEOUS SCREW;  Surgeon: Durene Romans, MD;  Location: [facility] ORS;  Service: Orthopedics;  Laterality: Left;   ORIF HUMERUS FRACTURE Right 06/11/2021   Procedure: OPEN REDUCTION INTERNAL FIXATION (ORIF) DISTAL HUMERUS FRACTURE;  Surgeon: Myrene Galas, MD;  Location: [facility] OR;  Service: Orthopedics;  Laterality: Right;   WRIST SURGERY  2005   left wrist   Patient Active Problem List   Diagnosis Date Noted   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HCC) 08/03/2018   Relationship problem with family member 08/03/2018   Femoral neck fracture, left, closed, initial encounter 07/08/2016   Severe recurrent major depression without psychotic features (HCC) 06/03/2015    Class: Chronic    Obsessive-compulsive disorder 06/03/2015    Class: Chronic    PCP: Creola Corn, MD  REFERRING PROVIDER: Venita Lick, MD  REFERRING DIAG: M54.51 (ICD-10-CM) - Vertebrogenic low back pain  THERAPY DIAG:  Chronic left-sided low back pain without sciatica  Muscle weakness (generalized)  Difficulty in walking, not elsewhere classified  Other abnormalities of gait and mobility  ONSET DATE: exacerbation 6 months ago  SUBJECTIVE:  SUBJECTIVE STATEMENT:  CP with increased tone right sided. Pt reports not being too limited as a child able to play sports. LBP since 30's.  Minor injury about 23 years ago picking up heavy books. Sees chiropractor which does give her some relief, has TENS unit which she has been using everyday.  Cold weather makes neck hurt.  Meniscus tear left knee 1.5 yrs ago with orthoscoptic intervention.  Left hip fx x 5 years ago with surgical intervention. R elbow fx 2022 PERTINENT HISTORY:   CP OPEN REDUCTION INTERNAL FIXATION (ORIF) DISTAL HUMERUS FRACTURE IM nail Left hip 2017 Osteoporosis  PAIN:  Are you having pain? Yes NPRS scale: 7/10 Pain location: c-spine  Pain orientation: Left  PAIN TYPE: aching Pain description: constant  Aggravating factors: sitting or walking for a long time Relieving factors: TEN, massage chiro  Are you having pain? Yes NPRS scale: 5/10 worst 10/10, least 0/10 Pain location: Left low back Pain orientation: Left  PAIN TYPE: aching Pain description: intermittent  Aggravating factors: sitting or walking for a long time Relieving factors: TEN, massage chiro  PRECAUTIONS: Fall  WEIGHT BEARING RESTRICTIONS No  FALLS:  Has patient fallen in last 6 months? No, Number of falls: 1 in Aug 2022  LIVING ENVIRONMENT: Lives with: lives with their  family Lives in: House/apartment Stairs: Yes; Internal: 15 steps; none Has following equipment at home: None   PLOF: Independent with household mobility without device  PATIENT GOALS to get stronger, more coordinated and better balanced.   OBJECTIVE:    PATIENT SURVEYS:  FOTO 44 with goal of 50   COGNITION:  Overall cognitive status: Within functional limits for tasks assessed     SENSATION:  intact  MUSCLE LENGTH: Hamstrings: Right 70 deg; Left 90 deg Thomas test: Right neg deg; Left neg deg  POSTURE:   left shoulder elevation, weightbearing shifted left  PALPATION: Tenderness bilat sub occipital and upper traps  LUMBARAROM/PROM  A/PROM A/PROM  12/15/2021  Flexion Forward reach 4 inches to floor  Extension   Right lateral flexion   Left lateral flexion   Right rotation   Left rotation    (Blank rows = not tested)  LE AROM/PROM:  A/PROM Right 12/15/2021 Left 12/15/2021  Hip flexion 90 105  Hip extension    Hip abduction 25 30  Hip adduction    Hip internal rotation    Hip external rotation    Knee flexion 105 120  Knee extension    Ankle dorsiflexion 5   Ankle plantarflexion 10   Ankle inversion    Ankle eversion     (Blank rows = not tested)  LE MMT:  MMT Right 12/15/2021 Left 12/15/2021  Hip flexion 5 4  Hip extension 4 4  Hip abduction 5 5  Hip adduction 5 5  Hip internal rotation    Hip external rotation    Knee flexion 5 5  Knee extension    Ankle dorsiflexion    Ankle plantarflexion    Ankle inversion    Ankle eversion     (Blank rows = not tested)  LUMBAR SPECIAL TESTS:  Straight leg raise test: Negative, Slump test: Negative, and Thomas test: Negative  FUNCTIONAL TESTS:  5 times sit to stand: 20 Timed up and go (TUG): 13 Berg Balance Scale: 36/56    GAIT: Distance walked: 566ft Assistive device utilized: None Level of assistance: Complete Independence Comments: right trendelenburg, left leaning, no heel strike or knee  extension right, shortened step length  TODAY'S TREATMENT  Evaluation; Instruction on HEP   PATIENT EDUCATION:  Education details: Condition management; Properties of water; benefit of aquatic therapy Person educated: Patient Education method: Explanation Education comprehension: verbalized understanding   HOME EXERCISE PROGRAM: Access Code: [facility] URL: [email or web address] Date: 12/15/2021 Prepared by: Geni Bers  Exercises Supine Lower Trunk Rotation - 1 x daily - 7 x weekly - 3 sets - 10 reps Supine Single Knee to Chest Stretch - 1 x daily - 7 x weekly - 3 sets - 10 reps  ASSESSMENT:  CLINICAL IMPRESSION: Patient is a 56 y.o. female who was seen today for physical therapy evaluation and treatment for Vertebrogenic low back pain.   Pt with multiple comorbididites presents today with c/o cervical and LBP.  Has hx of left hip fx, L meniscus tear, right elbow fx and CP.  She reports being very active as a child and in 20's but has become increasingly sedentary.  Amb without AD although significant gait decviation due to CP. Flexor tone Right extr effecting elbow, wrist and ankle predominately. She states pain is lower left lumbar area radiating into upper aspect of buttocks.  No sx radiating into LE.s.  She is found to have tightness in hips and LB. She will benefit from skilled physical therapy to improve functional movement, toleration to activity and improve balance decreasing fall risk.   OBJECTIVE IMPAIRMENTS Abnormal gait, decreased activity tolerance, decreased balance, decreased coordination, decreased mobility, difficulty walking, decreased strength, and pain.   ACTIVITY LIMITATIONS cleaning, community activity, yard work, and shopping.   PERSONAL FACTORS Time since onset of injury/illness/exacerbation and 3+ comorbidities:    are also affecting patient's functional outcome.    REHAB POTENTIAL: Good  CLINICAL DECISION MAKING: Evolving/moderate  complexity  EVALUATION COMPLEXITY: Moderate   GOALS: Goals reviewed with patient? Yes  SHORT TERM GOALS:  STG Name Target Date Goal status  1 Pt will tolerate entire sessions of aquatic therapy without increase in cervical or LBP Baseline:  01/05/2022 INITIAL  2 Pt will improve bilat hip abduction by 5d Baseline: R 25d; L30d 01/05/2022 INITIAL  3 Pt will improve bilat hip flex to 110d Baseline:R 90d; L 105d 01/05/2022 INITIAL                       LONG TERM GOALS:   LTG Name Target Date Goal status  1 Pt will improve on FOTO  to 50 to demonstrate improved functional status Baseline:44 01/26/2022 INITIAL  2 Pt will improve on Berg Balance Test to >or= 46/56 to demonstrate improvement in balance and decreased fall risk Baseline:36/56 01/26/2022 INITIAL  3 Pt will improve on 5 X STS test to <or= 15 to demonstrate improvement in LE strength Baseline:20 01/26/2022 INITIAL  4 Pt will decrease overall pain in cervical spine and LB to 4/10 or< for improved toleration to activity Baseline: 01/26/2022 INITIAL                  PLAN: PT FREQUENCY: 1-2x/week  PT DURATION: 6 weeks  PLANNED INTERVENTIONS: Therapeutic exercises, Therapeutic activity, Neuromuscular re-education, Balance training, Gait training, Patient/Family education, Joint manipulation, Joint mobilization, Stair training, Aquatic Therapy, Taping, and Manual therapy  PLAN FOR NEXT SESSION: Aquatics, Stretching LB, hip adductors, flex/ext; Core strengthening, Cervical spine ROM/stretching   Corrie Dandy Tomma Lightning) Sakura Denis MPT 12/15/2021, 1:27 PM

## 2021-12-15 ENCOUNTER — Other Ambulatory Visit: Payer: Self-pay

## 2021-12-15 ENCOUNTER — Ambulatory Visit (HOSPITAL_BASED_OUTPATIENT_CLINIC_OR_DEPARTMENT_OTHER): Payer: 59 | Attending: Orthopedic Surgery | Admitting: Physical Therapy

## 2021-12-15 ENCOUNTER — Encounter (HOSPITAL_BASED_OUTPATIENT_CLINIC_OR_DEPARTMENT_OTHER): Payer: Self-pay | Admitting: Physical Therapy

## 2021-12-15 DIAGNOSIS — M5451 Vertebrogenic low back pain: Secondary | ICD-10-CM | POA: Diagnosis not present

## 2021-12-15 DIAGNOSIS — R2689 Other abnormalities of gait and mobility: Secondary | ICD-10-CM | POA: Insufficient documentation

## 2021-12-15 DIAGNOSIS — G8929 Other chronic pain: Secondary | ICD-10-CM | POA: Diagnosis not present

## 2021-12-15 DIAGNOSIS — R262 Difficulty in walking, not elsewhere classified: Secondary | ICD-10-CM | POA: Diagnosis not present

## 2021-12-15 DIAGNOSIS — M545 Low back pain, unspecified: Secondary | ICD-10-CM

## 2021-12-15 DIAGNOSIS — M6281 Muscle weakness (generalized): Secondary | ICD-10-CM | POA: Diagnosis not present

## 2021-12-16 ENCOUNTER — Ambulatory Visit: Payer: 59 | Admitting: Psychiatry

## 2021-12-20 ENCOUNTER — Ambulatory Visit (INDEPENDENT_AMBULATORY_CARE_PROVIDER_SITE_OTHER): Payer: 59 | Admitting: Psychiatry

## 2021-12-20 ENCOUNTER — Other Ambulatory Visit: Payer: Self-pay

## 2021-12-20 DIAGNOSIS — G809 Cerebral palsy, unspecified: Secondary | ICD-10-CM | POA: Diagnosis not present

## 2021-12-20 DIAGNOSIS — F422 Mixed obsessional thoughts and acts: Secondary | ICD-10-CM

## 2021-12-20 DIAGNOSIS — Z636 Dependent relative needing care at home: Secondary | ICD-10-CM | POA: Diagnosis not present

## 2021-12-20 DIAGNOSIS — F339 Major depressive disorder, recurrent, unspecified: Secondary | ICD-10-CM

## 2021-12-20 DIAGNOSIS — F4010 Social phobia, unspecified: Secondary | ICD-10-CM

## 2021-12-20 NOTE — Progress Notes (Signed)
Psychotherapy Progress Note ?[facility] ?Casey Czar, PhD LP ? ?Patient ID: Casey Diaz (Casey "Casey Diaz")    MRN: [id number] ?Therapy format: Individual psychotherapy ?Date: 12/20/2021      Start: 11:16a     Stop: 12:06p     Time Spent: 50 min ?Location: In-person  ? ?Session narrative (presenting needs, interim history, self-report of stressors and symptoms, applications of prior therapy, status changes, and interventions made in session) ?Was cancelled 2 wks ago for TX illness.  Continues in Spravato treatment under Dr. Jennelle Diaz.  Immediately seems more responsive and less morose, but says it's still plenty hard.  New hx offered that just 1-2 wks after honeymoon Casey Diaz had heart surgery, contributing to a life together shaped by illness. ? ?Tragedy since last seen in that cousin Casey Diaz, father's side, her favorite, spent the past 2 years taking care of her parents while trying to maintain a job (attributes to her brother Casey Diaz, being too selfish to help) and recently died of undisclosed cervical cancer (10cm tumor).  Unresolved question why she didn't get help, suggestion she was demoralized, both by her brother and by her self-centered husband, who was said to be both financially and sexually unfaithful, and a few years estranged from her 2 daughters until very recently.  Last spoke just after Christmas, learning she spent the holiday alone.  Believes Casey Diaz intended to die, was convinced she had a shortened life, and in fact passed at 56yo.  Some help to speak with her children in the aftermath, though it is another tragedy, another story of rift in the family, another story of someone good getting the short end.  Support/empathy provided.   ? ?Now on 1 day/wk Spravato, not feeling like the cure-all she wanted, to get off all medication.  Credits her better energy to being with people, not so much the med.  Moods go up and down, reflected back to her as  a sign of progress vs. intractable dark and  hopeless feelings.  Typically gets 6-8.5 hrs sleep, though she feels she needs more and always wakes feeling like it hasn't been long enough.  Discussed possible degraded sleep as a factor making depression resistant and setting up fatigue, thought depression itself is enough, and her long-worn habit of agonizing.  Typically up 6:45 to get 56yo Casey Diaz started for school.   ADHD and autistic spectrum can be hard to deal with.  Will have him at a spectrum camp near [facility] one week this summer.  May get him in a minicamp at [facility] also.  Affirmed and encouraged in maintaining efforts to mainstream and in the opportunity to do some marriage building while away. ? ?Still tempted to feel sorry for herself, from being born with CP to having a socially and emotionally difficult child.  Affirmed that she has mended so well the last 6 months that she is off walker.  Also affirmed she will be checking out an orthopedic shoe to help stabilize her gait, and we typically celebrate any technology that helps handicaps.   ? ?Tempted right now to feel like God will punish her if she neglects to go to church, as she has been some.  Still the only one in her family willing to go.  Hard to get to [facility] since Dr. Gabriel Diaz left.  Assured there may be many ways to gather, and many places to fit, and God understands her situation much better than that.  Explored her God concept, differentiating the punitive, scolding  presence she feels from the God she professes to believe in, identifying that it is her own self-judgment she is projecting.  Offered expanded idea for consumption as Easter approaches (typically triggering for her to think about sacrifices for her soul, not being deserving of any of it, etc.) -- God as empathetic enough to be OK with her wish to be healed of CP, not demanding she reframe it as a gift in disguise or an asset to the kingdom, just inviting her, and empathetic enough that perhaps one big point of the  crucifixion -- and the loneliness, persecution, and torture that went before it -- is a demonstration of empathy for the Diaz race, showing willingness fo the Almighty to get inside mortal experience, not stand aloof.  Helpful.   ? ?Therapeutic modalities: Cognitive Behavioral Therapy, Solution-Oriented/Positive Psychology, and Faith-sensitive ? ?Mental Status/Observations: ? ?Appearance:   Casual     ?Behavior:  Appropriate  ?Motor:  Normal and except limitations from CP  ?Speech/Language:   Clear and Coherent  ?Affect:  Appropriate  ?Mood:  dysthymic, responsive  ?Thought process:  normal  ?Thought content:    Rumination  ?Sensory/Perceptual disturbances:    WNL  ?Orientation:  Fully oriented  ?Attention:  Good  ?  ?Concentration:  Good  ?Memory:  WNL  ?Insight:    Good  ?Judgment:   Good  ?Impulse Control:  Good  ? ?Risk Assessment: ?Danger to Self: No Self-injurious Behavior: No ?Danger to Others: No Physical Aggression / Violence: No ?Duty to Warn: No Access to Firearms a concern: No ? ?Assessment of progress:  progressing ? ?Diagnosis: ?  ICD-10-CM   ?1. Recurrent major depression resistant to treatment [facility])  F33.9   ?  ?2. Mixed obsessional thoughts and acts  F42.2   ?  ?3. 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HCC)  G80.9   ?  ?4. Caregiver stress  Z63.6   ?  ?5. Social anxiety disorder  F40.10   ?  ? ?Plan:  ?Support Spravato treatment -- observable results, even if they are not subjectively noticed ?Reconcile disciplinary strategy with husband for harmony and help with Casey Diaz's stubborn behaviors ?Continue appropriate efforts to shape Casey Diaz's responsibility to clean up after himself -- "after you ___,, then you can ___" ?Self-affirm it is her own self-judgment, not God's speaking condemnation and that church is about the invitation to fellowship, not an 11th commandment to attend.  Consider more grace- and empathy-based conception. ?Other recommendations/advice as may be noted above ?Continue to utilize previously  learned skills ad lib ?Maintain medication as prescribed and work faithfully with relevant prescriber(s) if any changes are desired or seem indicated ?Call the clinic on-call service, 988/hotline, 911, or present to [facility] or ER if any life-threatening psychiatric crisis ?Return for time as available. ?Already scheduled visit in this office 12/23/2021. ? ?Robley Fries, PhD ?Casey Czar, PhD LP ?Clinical Psychologist, [facility] ?[facility] ?[address] ?[place], [place] [address] ?(o) [phone] ?

## 2021-12-21 ENCOUNTER — Encounter (HOSPITAL_BASED_OUTPATIENT_CLINIC_OR_DEPARTMENT_OTHER): Payer: Self-pay | Admitting: Physical Therapy

## 2021-12-21 ENCOUNTER — Ambulatory Visit (HOSPITAL_BASED_OUTPATIENT_CLINIC_OR_DEPARTMENT_OTHER): Payer: 59 | Admitting: Physical Therapy

## 2021-12-21 DIAGNOSIS — R2689 Other abnormalities of gait and mobility: Secondary | ICD-10-CM

## 2021-12-21 DIAGNOSIS — G8929 Other chronic pain: Secondary | ICD-10-CM

## 2021-12-21 DIAGNOSIS — M5451 Vertebrogenic low back pain: Secondary | ICD-10-CM | POA: Diagnosis not present

## 2021-12-21 DIAGNOSIS — M6281 Muscle weakness (generalized): Secondary | ICD-10-CM

## 2021-12-21 DIAGNOSIS — R262 Difficulty in walking, not elsewhere classified: Secondary | ICD-10-CM

## 2021-12-21 NOTE — Therapy (Unsigned)
OUTPATIENT PHYSICAL THERAPY TREATMENT NOTE   Patient Name: Casey Diaz MRN: [id number] DOB:Sep 10, 1966, 56 y.o., female Today's Date: 12/21/2021  PCP: Creola Corn, MD REFERRING PROVIDER: Creola Corn, MD   PT End of Session - 12/21/21 1622     Visit Number 2    Number of Visits 12    Date for PT Re-Evaluation 01/26/22    Progress Note Due on Visit 10    PT Start Time 1615    PT Stop Time 1700    PT Time Calculation (min) 45 min    Activity Tolerance Patient tolerated treatment well    Behavior During Therapy [facility] for tasks assessed/performed             Past Medical History:  Diagnosis Date   Abnormal Pap smear 2011   hpv/mild dysplasia,cin1   Anxiety    Cerebral palsy (HCC)    right arm/leg   Cystocele    Depression    Headache    Neuromuscular disorder (HCC)    Cerebral Palsy   OCD (obsessive compulsive disorder)    Osteoporosis    Uterine prolaps    Past Surgical History:  Procedure Laterality Date   COLPOSCOPY  2011   ELBOW SURGERY     left elbow-separation of bones -age 74   ELBOW SURGERY  2015   fell on ice- right elbow fracture   FRACTURE SURGERY     HAMMER TOE SURGERY  1/13   RIGHT SIDE   HIP PINNING,CANNULATED Left 07/08/2016   Procedure: LEFT CLOSED REDUCTION HIP AND PERCUTANEOUS SCREW;  Surgeon: Durene Romans, MD;  Location: [facility];  Service: Orthopedics;  Laterality: Left;   ORIF HUMERUS FRACTURE Right 06/11/2021   Procedure: OPEN REDUCTION INTERNAL FIXATION (ORIF) DISTAL HUMERUS FRACTURE;  Surgeon: Myrene Galas, MD;  Location: [facility];  Service: Orthopedics;  Laterality: Right;   WRIST SURGERY  2005   left wrist   Patient Active Problem List   Diagnosis Date Noted   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HCC) 08/03/2018   Relationship problem with family member 08/03/2018   Femoral neck fracture, left, closed, initial encounter 07/08/2016   Severe recurrent major depression without psychotic features (HCC)  06/03/2015    Class: Chronic   Obsessive-compulsive disorder 06/03/2015    Class: Chronic    REFERRING DIAG: M54.51 (ICD-10-CM) - Vertebrogenic low back pain   THERAPY DIAG:  Chronic left-sided low back pain without sciatica  Muscle weakness (generalized)  Difficulty in walking, not elsewhere classified  Other abnormalities of gait and mobility  PERTINENT HISTORY: CP OPEN REDUCTION INTERNAL FIXATION (ORIF) DISTAL HUMERUS FRACTURE IM nail Left hip 2017 Osteoporosis  PRECAUTIONS: fall  SUBJECTIVE: ***   PAIN:  Are you having pain? Yes NPRS scale: 7/10 Pain location: c-spine  Pain orientation: Left  PAIN TYPE: aching Pain description: constant  Aggravating factors: sitting or walking for a long time Relieving factors: TEN, massage chiro   Are you having pain? Yes NPRS scale: 5/10 worst 10/10, least 0/10 Pain location: Left low back Pain orientation: Left  PAIN TYPE: aching Pain description: intermittent  Aggravating factors: sitting or walking for a long time Relieving factors: TEN, massage chiro   PRECAUTIONS: Fall   WEIGHT BEARING RESTRICTIONS No   FALLS:  Has patient fallen in last 6 months? No, Number of falls: 1 in Aug 2022   LIVING ENVIRONMENT: Lives with: lives with their family Lives in: House/apartment Stairs: Yes; Internal: 15 steps; none Has following equipment at home:  None     PLOF: Independent with household mobility without device   PATIENT GOALS to get stronger, more coordinated and better balanced.     OBJECTIVE:      PATIENT SURVEYS:  FOTO 44 with goal of 50     COGNITION:          Overall cognitive status: Within functional limits for tasks assessed                        SENSATION:          intact   MUSCLE LENGTH: Hamstrings: Right 70 deg; Left 90 deg Thomas test: Right neg deg; Left neg deg   POSTURE:   left shoulder elevation, weightbearing shifted left   PALPATION: Tenderness bilat sub occipital and upper  traps   LUMBARAROM/PROM   A/PROM A/PROM  12/15/2021  Flexion Forward reach 4 inches to floor  Extension    Right lateral flexion    Left lateral flexion    Right rotation    Left rotation     (Blank rows = not tested)   LE AROM/PROM:   A/PROM Right 12/15/2021 Left 12/15/2021  Hip flexion 90 105  Hip extension      Hip abduction 25 30  Hip adduction      Hip internal rotation      Hip external rotation      Knee flexion 105 120  Knee extension      Ankle dorsiflexion 5    Ankle plantarflexion 10    Ankle inversion      Ankle eversion       (Blank rows = not tested)   LE MMT:   MMT Right 12/15/2021 Left 12/15/2021  Hip flexion 5 4  Hip extension 4 4  Hip abduction 5 5  Hip adduction 5 5  Hip internal rotation      Hip external rotation      Knee flexion 5 5  Knee extension      Ankle dorsiflexion      Ankle plantarflexion      Ankle inversion      Ankle eversion       (Blank rows = not tested)   LUMBAR SPECIAL TESTS:  Straight leg raise test: Negative, Slump test: Negative, and Thomas test: Negative   FUNCTIONAL TESTS:  5 times sit to stand: 20 Timed up and go (TUG): 13 Berg Balance Scale: 36/56     GAIT: Distance walked: 55000ft Assistive device utilized: None Level of assistance: Complete Independence Comments: right trendelenburg, left leaning, no heel strike or knee extension right, shortened step length         TODAY'S TREATMENT  Pt seen for aquatic therapy today.  Treatment took place in water 3.25-4.8 ft in depth at the [facility] pool. Temp of water was 93.  Pt entered/exited the pool via stairs (step through pattern) independently with bilat rail. Introduction to setting Walking multiple widths and lengths of pool using yellow noodle for ue support. Pt re-ed on properties of water and benefits of aquatic therapy.  Seated Stretching gastroc, hamstring 3 x 20s Flutter kicking seated on 4 step (bottom) 2 x 20 Add/abd 2x20 STS from 3  step x 10 hha. Cues for weight shift and immediate standing balance.  Standing Holding to wall:  stretching hip flex and quads 2x20s ea hip extension; add/abd; marching 2x10  Balance ue supported yellow hand buoys -Feet together x 30s; VE x 30s 2 tries -  Semi-tandem multiple tries leading with R/L best 20s Left leading -SLS Left best x 10s, right unable to maintain Kick board push downs 2 x 10  Tandem walking 4 widths.  Pt requires buoyancy for support and to offload joints with strengthening exercises. Viscosity of the water is needed for resistance of strengthening; water current perturbations provides challenge to standing balance unsupported, requiring increased core activation.      PATIENT EDUCATION:  Education details: Condition management; Properties of water; benefit of aquatic therapy Person educated: Patient Education method: Explanation Education comprehension: verbalized understanding     HOME EXERCISE PROGRAM: Access Code: [facility] URL: [email or web address] Date: 12/15/2021 Prepared by: Geni Bers   Exercises Supine Lower Trunk Rotation - 1 x daily - 7 x weekly - 3 sets - 10 reps Supine Single Knee to Chest Stretch - 1 x daily - 7 x weekly - 3 sets - 10 reps   ASSESSMENT:   CLINICAL IMPRESSION: Pt with some apprehensiveness with entering pool.  She requires sba for safety with amb initially. As session progresses less assistance needed.  She is directed through multiple activities today to initiate stretching, strengthening and balance. Pt with difficulty maintaining seated position submerged 50%.  She is most challenged by balance in standing with moderate unsteadiness RLE.  She is unable to maintain SLS R 50% submerged with ue support. Did gain good core activation. Pt without complaints today of LB or cervical pain. Minor ankle pain 2/10.  Patient is a 55 y.o. female who was seen today for physical therapy evaluation and treatment for  Vertebrogenic low back pain.   Pt with multiple comorbididites presents today with c/o cervical and LBP.  Has hx of left hip fx, L meniscus tear, right elbow fx and CP.  She reports being very active as a child and in 20's but has become increasingly sedentary.  Amb without AD although significant gait decviation due to CP. Flexor tone Right extr effecting elbow, wrist and ankle predominately. She states pain is lower left lumbar area radiating into upper aspect of buttocks.  No sx radiating into LE.s.  She is found to have tightness in hips and LB. She will benefit from skilled physical therapy to improve functional movement, toleration to activity and improve balance decreasing fall risk.     OBJECTIVE IMPAIRMENTS Abnormal gait, decreased activity tolerance, decreased balance, decreased coordination, decreased mobility, difficulty walking, decreased strength, and pain.    ACTIVITY LIMITATIONS cleaning, community activity, yard work, and shopping.    PERSONAL FACTORS Time since onset of injury/illness/exacerbation and 3+ comorbidities:    are also affecting patient's functional outcome.      REHAB POTENTIAL: Good   CLINICAL DECISION MAKING: Evolving/moderate complexity   EVALUATION COMPLEXITY: Moderate     GOALS: Goals reviewed with patient? Yes   SHORT TERM GOALS:   STG Name Target Date Goal status  1 Pt will tolerate entire sessions of aquatic therapy without increase in cervical or LBP Baseline:  01/05/2022 INITIAL  2 Pt will improve bilat hip abduction by 5d Baseline: R 25d; L30d 01/05/2022 INITIAL  3 Pt will improve bilat hip flex to 110d Baseline:R 90d; L 105d 01/05/2022 INITIAL                                        LONG TERM GOALS:    LTG Name Target Date Goal status  1 Pt will improve on FOTO  to 50 to demonstrate improved functional status Baseline:44 01/26/2022 INITIAL  2 Pt will improve on Berg Balance Test to >or= 46/56 to demonstrate improvement in balance and  decreased fall risk Baseline:36/56 01/26/2022 INITIAL  3 Pt will improve on 5 X STS test to <or= 15 to demonstrate improvement in LE strength Baseline:20 01/26/2022 INITIAL  4 Pt will decrease overall pain in cervical spine and LB to 4/10 or< for improved toleration to activity Baseline: 01/26/2022 INITIAL                               PLAN: PT FREQUENCY: 1-2x/week   PT DURATION: 6 weeks   PLANNED INTERVENTIONS: Therapeutic exercises, Therapeutic activity, Neuromuscular re-education, Balance training, Gait training, Patient/Family education, Joint manipulation, Joint mobilization, Stair training, Aquatic Therapy, Taping, and Manual therapy   PLAN FOR NEXT SESSION: Aquatics, Stretching LB, hip adductors, flex/ext; Core strengthening, Cervical spine ROM/stretching    Corrie Dandy Tomma Lightning) Rhilynn Preyer MPT 12/21/2021, 6:06 PM

## 2021-12-23 ENCOUNTER — Other Ambulatory Visit: Payer: Self-pay

## 2021-12-23 ENCOUNTER — Ambulatory Visit (INDEPENDENT_AMBULATORY_CARE_PROVIDER_SITE_OTHER): Payer: 59 | Admitting: Psychiatry

## 2021-12-23 ENCOUNTER — Ambulatory Visit: Payer: 59

## 2021-12-23 VITALS — BP 119/78 | HR 77

## 2021-12-23 DIAGNOSIS — F339 Major depressive disorder, recurrent, unspecified: Secondary | ICD-10-CM

## 2021-12-23 DIAGNOSIS — F5105 Insomnia due to other mental disorder: Secondary | ICD-10-CM | POA: Diagnosis not present

## 2021-12-23 DIAGNOSIS — G809 Cerebral palsy, unspecified: Secondary | ICD-10-CM

## 2021-12-23 DIAGNOSIS — F4010 Social phobia, unspecified: Secondary | ICD-10-CM

## 2021-12-23 DIAGNOSIS — F422 Mixed obsessional thoughts and acts: Secondary | ICD-10-CM | POA: Diagnosis not present

## 2021-12-26 NOTE — Progress Notes (Signed)
NURSE Visit: ?  ?Pt arrived for her weekly Spravato Treatment, she was unable to attend her treatment session last week due to her husband being out of town. 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 B/P at 3:10 PM 113/59, 56, Pt instructed to blow her nose and to recline back at 45 degrees. Pt given first nasal spray (28 mg) administered by pt observed by nurse. There were 5 minutes between each dose, total of 84 mg. Tolerated well. Pt's medication is delivered by [facility] in [place] and stored inside a safe behind a locked door as well. Spravato is a CIII medication and has to be only given at a treatment facility and never given to patient. ? Pt relaxes in recliner, no complaints voiced. No sedation noted but pt did have the lights off and listening to music on the TV. Pt does experience some dissociation but does clear up prior to discharge. Pt's 40 minute vital signs at 4:00 PM 125/75 84. [name] comes to discuss her care at the end of her treatment when her thoughts are clearer. No complaints of a headache today after her treatment. Discharge vitals at 5:10 PM 119/78, 77. Pt stable for discharge. Next treatment is the following Thursday, March 16th.  ?Pt was observed on site a total of 120 minutes per FDA/REMS requirements. Pt was with nurse for clinical assessment a total of 55 minutes.  ? ? ?LOT [id number] ?EXP JUL 2024 ?

## 2021-12-29 NOTE — Therapy (Signed)
?OUTPATIENT PHYSICAL THERAPY TREATMENT NOTE ? ? ?Patient Name: Casey Diaz ?MRN: [id number] ?DOB:05/15/1966, 56 y.o., female ?Today's Date: 12/30/2021 ? ?PCP: Creola Corn, MD ?REFERRING PROVIDER: Creola Corn, MD ? ? PT End of Session - 12/30/21 0901   ? ? Visit Number 3   ? Number of Visits 12   ? Date for PT Re-Evaluation 01/26/22   ? Progress Note Due on Visit 10   ? PT Start Time [phone]   pt arrived late  ? PT Stop Time 0929   ? PT Time Calculation (min) 34 min   ? Activity Tolerance Patient tolerated treatment well   ? Behavior During Therapy [facility] for tasks assessed/performed   ? ?  ?  ? ?  ? ? ? ?Past Medical History:  ?Diagnosis Date  ? Abnormal Pap smear 2011  ? hpv/mild dysplasia,cin1  ? Anxiety   ? Cerebral palsy (HCC)   ? right arm/leg  ? Cystocele   ? Depression   ? Headache   ? Neuromuscular disorder (HCC)   ? Cerebral Palsy  ? OCD (obsessive compulsive disorder)   ? Osteoporosis   ? Uterine prolaps   ? ?Past Surgical History:  ?Procedure Laterality Date  ? COLPOSCOPY  2011  ? ELBOW SURGERY    ? left elbow-separation of bones -age 47  ? ELBOW SURGERY  2015  ? fell on ice- right elbow fracture  ? FRACTURE SURGERY    ? HAMMER TOE SURGERY  1/13  ? RIGHT SIDE  ? HIP PINNING,CANNULATED Left 07/08/2016  ? Procedure: LEFT CLOSED REDUCTION HIP AND PERCUTANEOUS SCREW;  Surgeon: Durene Romans, MD;  Location: [facility];  Service: Orthopedics;  Laterality: Left;  ? ORIF HUMERUS FRACTURE Right 06/11/2021  ? Procedure: OPEN REDUCTION INTERNAL FIXATION (ORIF) DISTAL HUMERUS FRACTURE;  Surgeon: Myrene Galas, MD;  Location: [facility] OR;  Service: Orthopedics;  Laterality: Right;  ? WRIST SURGERY  2005  ? left wrist  ? ?Patient Active Problem List  ? Diagnosis Date Noted  ? 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HCC) 08/03/2018  ? Relationship problem with family member 08/03/2018  ? Femoral neck fracture, left, closed, initial encounter 07/08/2016  ? Severe recurrent major depression without  psychotic features (HCC) 06/03/2015  ?  Class: Chronic  ? Obsessive-compulsive disorder 06/03/2015  ?  Class: Chronic  ? ? ?REFERRING DIAG: M54.51 (ICD-10-CM) - Vertebrogenic low back pain  ? ?THERAPY DIAG:  ?Chronic left-sided low back pain without sciatica ? ?Muscle weakness (generalized) ? ?Difficulty in walking, not elsewhere classified ? ?Other abnormalities of gait and mobility ? ?PERTINENT HISTORY: CP ?OPEN REDUCTION INTERNAL FIXATION (ORIF) DISTAL HUMERUS FRACTURE ?IM nail Left hip 2017 ?Osteoporosis ? ?PRECAUTIONS: fall ? ?SUBJECTIVE: Pt reports she did ok following last session, but was really sore 2 days later.  She was adjusted in neck/back at chiropractor 2 days ago; pain in those areas is not bad right now.  ? ? ?PAIN:  ?Are you having pain? Yes ?NPRS scale: 4/10 ?Pain location: ankle ?Pain orientation: R ?PAIN TYPE: aching ?Pain description: constant  ?Aggravating factors: walking  ?Relieving factors: rest ?   ?PRECAUTIONS: Fall ?  ?WEIGHT BEARING RESTRICTIONS No ?  ?FALLS:  ?Has patient fallen in last 6 months? No, Number of falls: 1 in Aug 2022 ?  ?LIVING ENVIRONMENT: ?Lives with: lives with their family ?Lives in: House/apartment ?Stairs: Yes; Internal: 15 steps; none ?Has following equipment at home: None ?  ?  ?PLOF: Independent with household mobility without device ?  ?  PATIENT GOALS to get stronger, more coordinated and better balanced. ?  ?  ?OBJECTIVE:  ?  ?  ?PATIENT SURVEYS:  ?FOTO 44 with goal of 50 ?  ?  ?COGNITION: ?         Overall cognitive status: Within functional limits for tasks assessed              ?          ?SENSATION: ?         intact ?  ?MUSCLE LENGTH: ?Hamstrings: Right 70 deg; Left 90 deg ?Thomas test: Right neg deg; Left neg deg ?  ?POSTURE:  ? left shoulder elevation, weightbearing shifted left ?  ?PALPATION: ?Tenderness bilat sub occipital and upper traps ?  ?LUMBARAROM/PROM ?  ?A/PROM A/PROM  ?12/15/2021  ?Flexion Forward reach 4 inches to floor  ?Extension    ?Right  lateral flexion    ?Left lateral flexion    ?Right rotation    ?Left rotation    ? (Blank rows = not tested) ?  ?LE AROM/PROM: ?  ?A/PROM Right ?12/15/2021 Left ?12/15/2021  ?Hip flexion 90 105  ?Hip extension      ?Hip abduction 25 30  ?Hip adduction      ?Hip internal rotation      ?Hip external rotation      ?Knee flexion 105 120  ?Knee extension      ?Ankle dorsiflexion 5    ?Ankle plantarflexion 10    ?Ankle inversion      ?Ankle eversion      ? (Blank rows = not tested) ?  ?LE MMT: ?  ?MMT Right ?12/15/2021 Left ?12/15/2021  ?Hip flexion 5 4  ?Hip extension 4 4  ?Hip abduction 5 5  ?Hip adduction 5 5  ?Hip internal rotation      ?Hip external rotation      ?Knee flexion 5 5  ?Knee extension      ?Ankle dorsiflexion      ?Ankle plantarflexion      ?Ankle inversion      ?Ankle eversion      ? (Blank rows = not tested) ?  ?LUMBAR SPECIAL TESTS:  ?Straight leg raise test: Negative, Slump test: Negative, and Thomas test: Negative ?  ?FUNCTIONAL TESTS:  ?5 times sit to stand: 20 ?Timed up and go (TUG): 13 ?Berg Balance Scale: 36/56   ?  ?GAIT: ?Distance walked: 54500ft ?Assistive device utilized: None ?Level of assistance: Complete Independence ?Comments: right trendelenburg, left leaning, no heel strike or knee extension right, shortened step length ?  ?  ?  ?  ?TODAY'S TREATMENT  ?Pt seen for aquatic therapy today.  Treatment took place in water 3.25-4.8 ft in depth at the [facility] pool. Temp of water was 91?.  Pt entered/exited the pool via stairs (step through pattern) independently with bilat rail. ? ?Warm up:  ?Multiple widths of walking forward, backward, and sidestepping without UE support ?Seated: ?Flutter kicking seated on 3rd step (bottom) 2 x 20 ?Add/abd 2x20, 4th step ?STS from 3rd step x 12, UE support on rail (some reps with only RUE) ,cue for hip hinge and eccentric lowering  ? ?Forward Rt step ups, RUE on rail x 12 ?Hip abdct x 12, Hip ext x 12 each holding yellow dumbbells for support ?Ab set  with kickboard push down x 5 sec x 5 reps (vertical position); repeated with push pull x 20 ? ?Pt requires buoyancy for support and to offload joints with strengthening exercises.  Viscosity of the water is needed for resistance of strengthening; water current perturbations provides challenge to standing balance unsupported, requiring increased core activation. ? ?  ?  ?PATIENT EDUCATION:  ?Education details: Condition management; Properties of water; benefit of aquatic therapy ?Person educated: Patient ?Education method: Explanation ?Education comprehension: verbalized understanding ?  ?  ?HOME EXERCISE PROGRAM: ?Access Code: [email or web address] ?URL: [email or web address] ?Date: 12/15/2021 ?Prepared by: Geni Bers ?  ?Exercises ?Supine Lower Trunk Rotation - 1 x daily - 7 x weekly - 3 sets - 10 reps ?Supine Single Knee to Chest Stretch - 1 x daily - 7 x weekly - 3 sets - 10 reps ?  ?ASSESSMENT: ?  ?CLINICAL IMPRESSION: ?Pt confident in aquatic environment; therapist provided direction of exercise from pool deck.   Pt with difficulty maintaining seated position submerged on 3rd step from bottom. Improved SLS R while submerged to chest with UE support on dumbbells.  Pt without complaints today of LB or cervical pain. Reduction of ankle pain 2-3/10 while in water.  Shortened session due to pt's late arrival.  Goals are ongoing.  ? ?  ?  ?OBJECTIVE IMPAIRMENTS Abnormal gait, decreased activity tolerance, decreased balance, decreased coordination, decreased mobility, difficulty walking, decreased strength, and pain.  ?  ?ACTIVITY LIMITATIONS cleaning, community activity, yard work, and shopping.  ?  ?PERSONAL FACTORS Time since onset of injury/illness/exacerbation and 3+ comorbidities:    are also affecting patient's functional outcome.  ?  ?  ?REHAB POTENTIAL: Good ?  ?CLINICAL DECISION MAKING: Evolving/moderate complexity ?  ?EVALUATION COMPLEXITY: Moderate ?  ?  ?GOALS: ?Goals reviewed with patient? Yes ?   ?SHORT TERM GOALS: ?  ?STG Name Target Date Goal status  ?1 Pt will tolerate entire sessions of aquatic therapy without increase in cervical or LBP ?Baseline:  01/05/2022 INITIAL  ?2 Pt will improve bilat hip abductio

## 2021-12-30 ENCOUNTER — Other Ambulatory Visit: Payer: Self-pay

## 2021-12-30 ENCOUNTER — Encounter (HOSPITAL_BASED_OUTPATIENT_CLINIC_OR_DEPARTMENT_OTHER): Payer: Self-pay | Admitting: Physical Therapy

## 2021-12-30 ENCOUNTER — Ambulatory Visit (INDEPENDENT_AMBULATORY_CARE_PROVIDER_SITE_OTHER): Payer: 59 | Admitting: Psychiatry

## 2021-12-30 ENCOUNTER — Ambulatory Visit: Payer: 59

## 2021-12-30 ENCOUNTER — Ambulatory Visit (HOSPITAL_BASED_OUTPATIENT_CLINIC_OR_DEPARTMENT_OTHER): Payer: 59 | Admitting: Physical Therapy

## 2021-12-30 VITALS — BP 115/81 | HR 93

## 2021-12-30 DIAGNOSIS — F339 Major depressive disorder, recurrent, unspecified: Secondary | ICD-10-CM | POA: Diagnosis not present

## 2021-12-30 DIAGNOSIS — G8929 Other chronic pain: Secondary | ICD-10-CM

## 2021-12-30 DIAGNOSIS — F422 Mixed obsessional thoughts and acts: Secondary | ICD-10-CM | POA: Diagnosis not present

## 2021-12-30 DIAGNOSIS — G809 Cerebral palsy, unspecified: Secondary | ICD-10-CM

## 2021-12-30 DIAGNOSIS — F4010 Social phobia, unspecified: Secondary | ICD-10-CM

## 2021-12-30 DIAGNOSIS — R262 Difficulty in walking, not elsewhere classified: Secondary | ICD-10-CM

## 2021-12-30 DIAGNOSIS — M5451 Vertebrogenic low back pain: Secondary | ICD-10-CM | POA: Diagnosis not present

## 2021-12-30 DIAGNOSIS — R2689 Other abnormalities of gait and mobility: Secondary | ICD-10-CM

## 2021-12-30 DIAGNOSIS — M6281 Muscle weakness (generalized): Secondary | ICD-10-CM

## 2021-12-30 DIAGNOSIS — F5105 Insomnia due to other mental disorder: Secondary | ICD-10-CM | POA: Diagnosis not present

## 2021-12-31 ENCOUNTER — Encounter: Payer: Self-pay | Admitting: Podiatry

## 2021-12-31 ENCOUNTER — Ambulatory Visit (INDEPENDENT_AMBULATORY_CARE_PROVIDER_SITE_OTHER): Payer: 59 | Admitting: Podiatry

## 2021-12-31 ENCOUNTER — Telehealth: Payer: Self-pay | Admitting: Psychiatry

## 2021-12-31 DIAGNOSIS — L600 Ingrowing nail: Secondary | ICD-10-CM

## 2021-12-31 DIAGNOSIS — R2681 Unsteadiness on feet: Secondary | ICD-10-CM

## 2021-12-31 DIAGNOSIS — G809 Cerebral palsy, unspecified: Secondary | ICD-10-CM

## 2021-12-31 IMAGING — DX DG ELBOW 2V*R*
3 series · 3 of 3 positions shown · non-contrast
Comparison: 06/11/2021

CLINICAL DATA: Postop right elbow

EXAM:
RIGHT ELBOW - 2 VIEW

[elbow lat (1 of 3)]
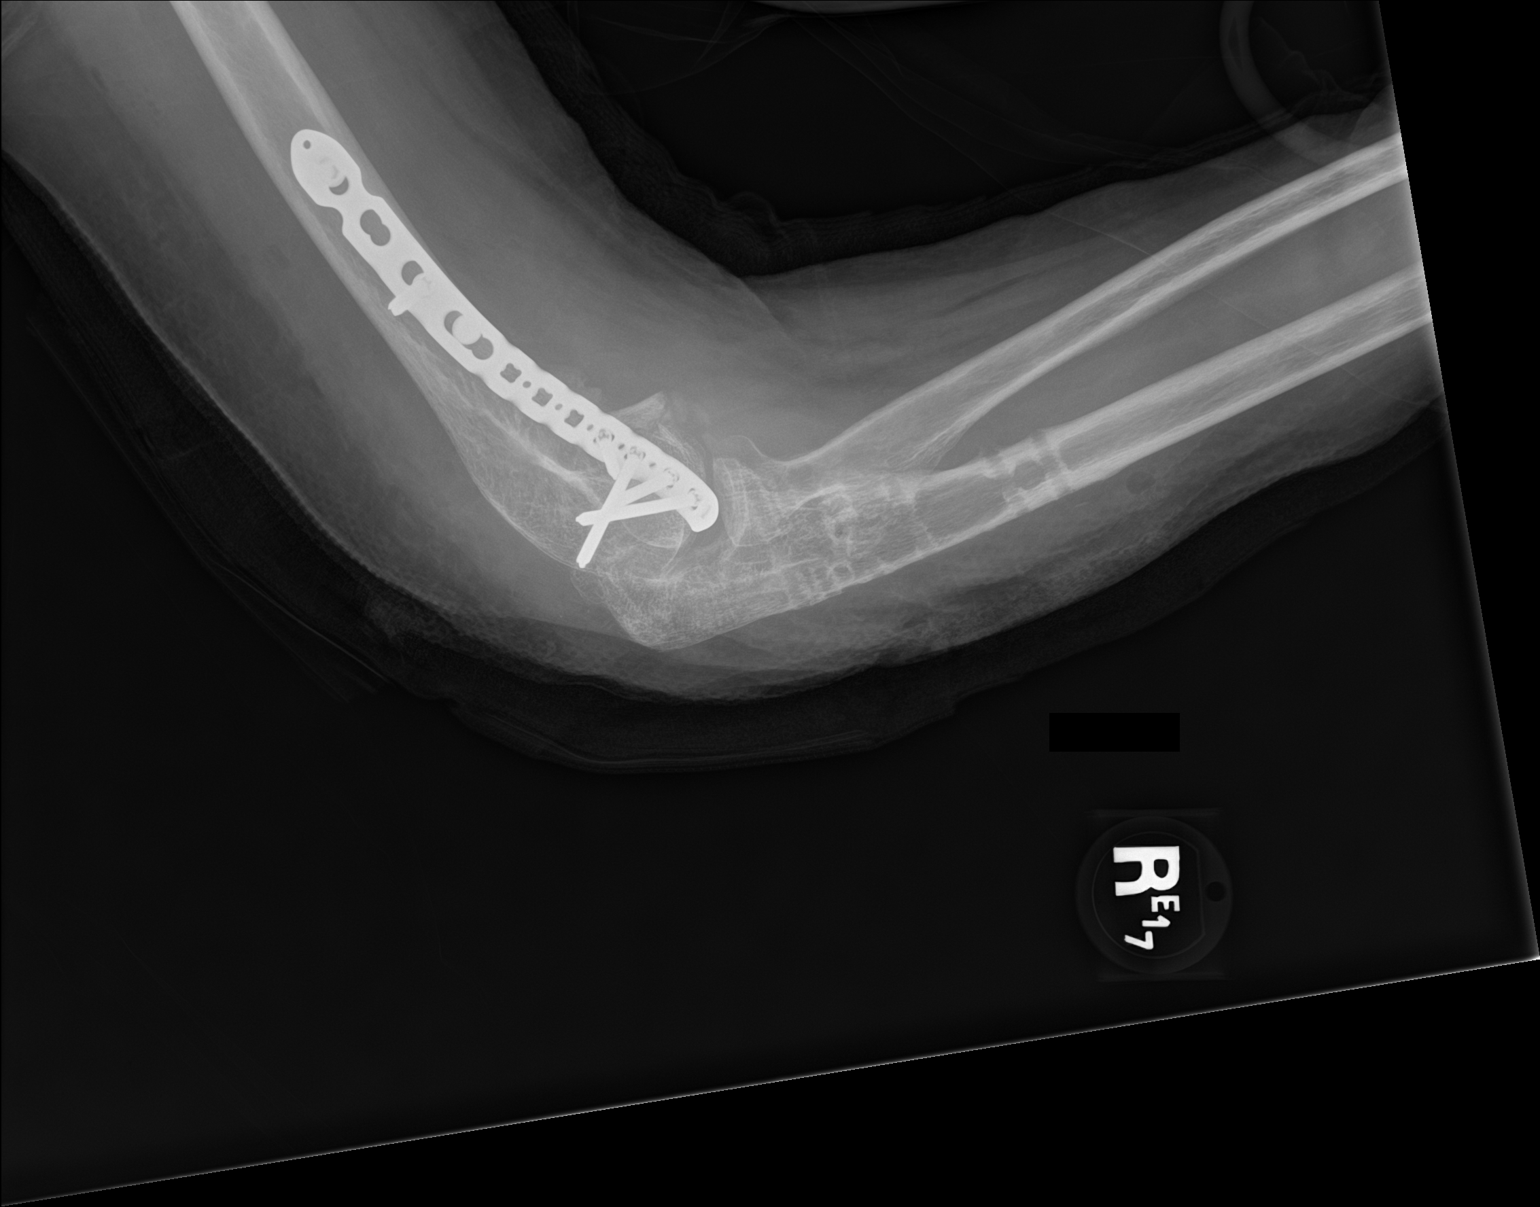

[elbow lat (2 of 3)]
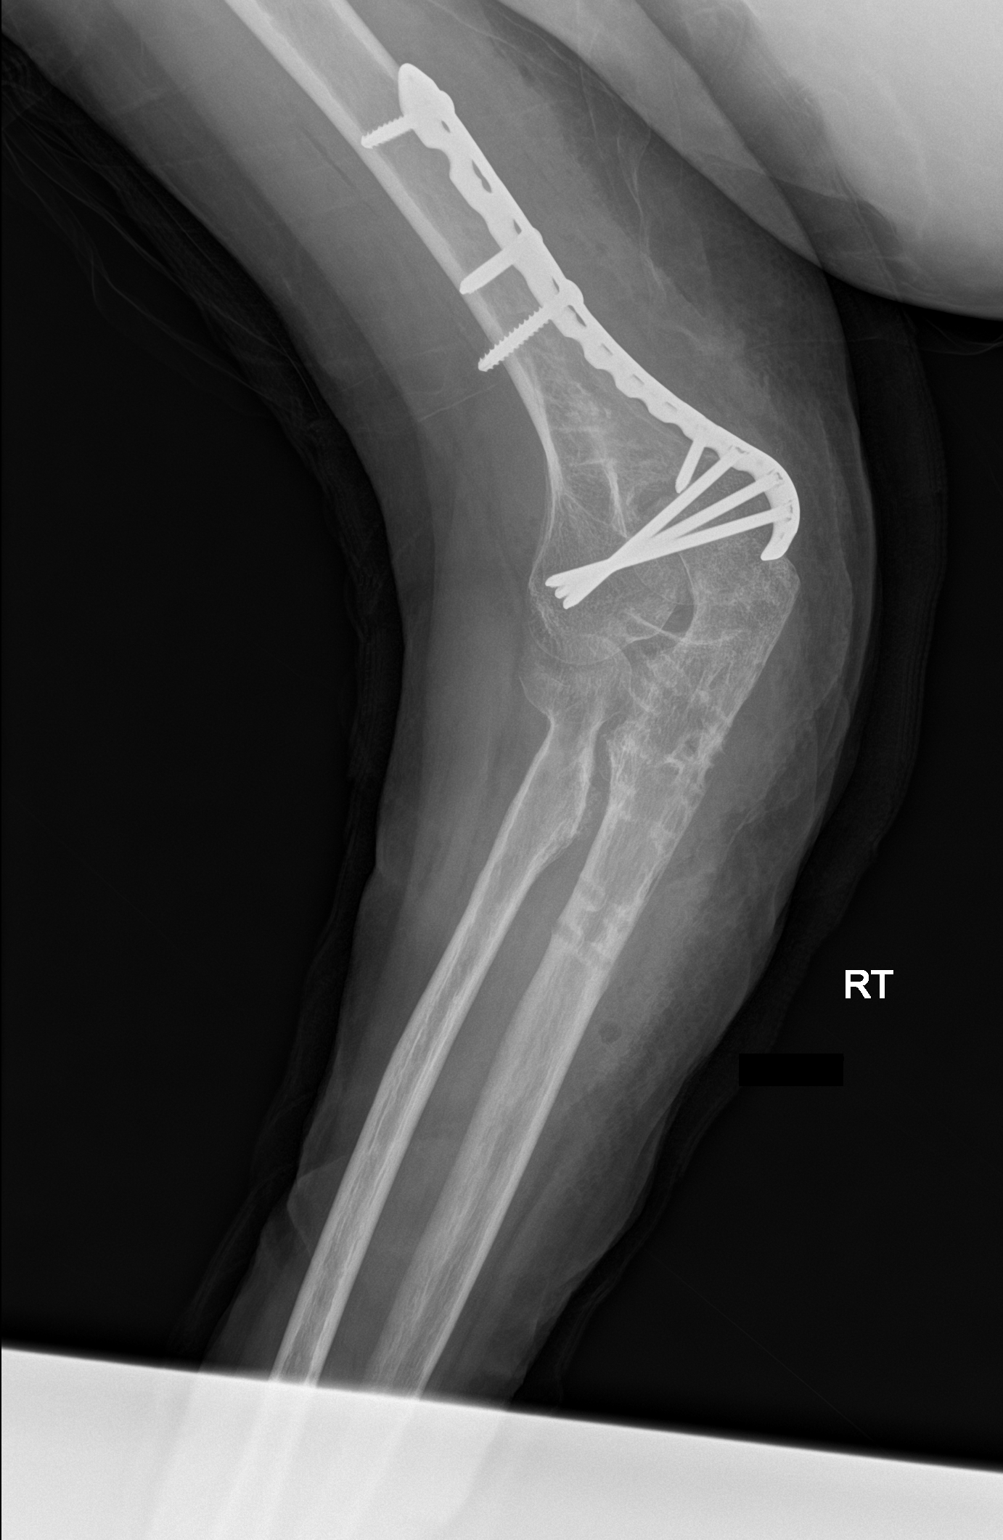

[elbow lat (3 of 3)]
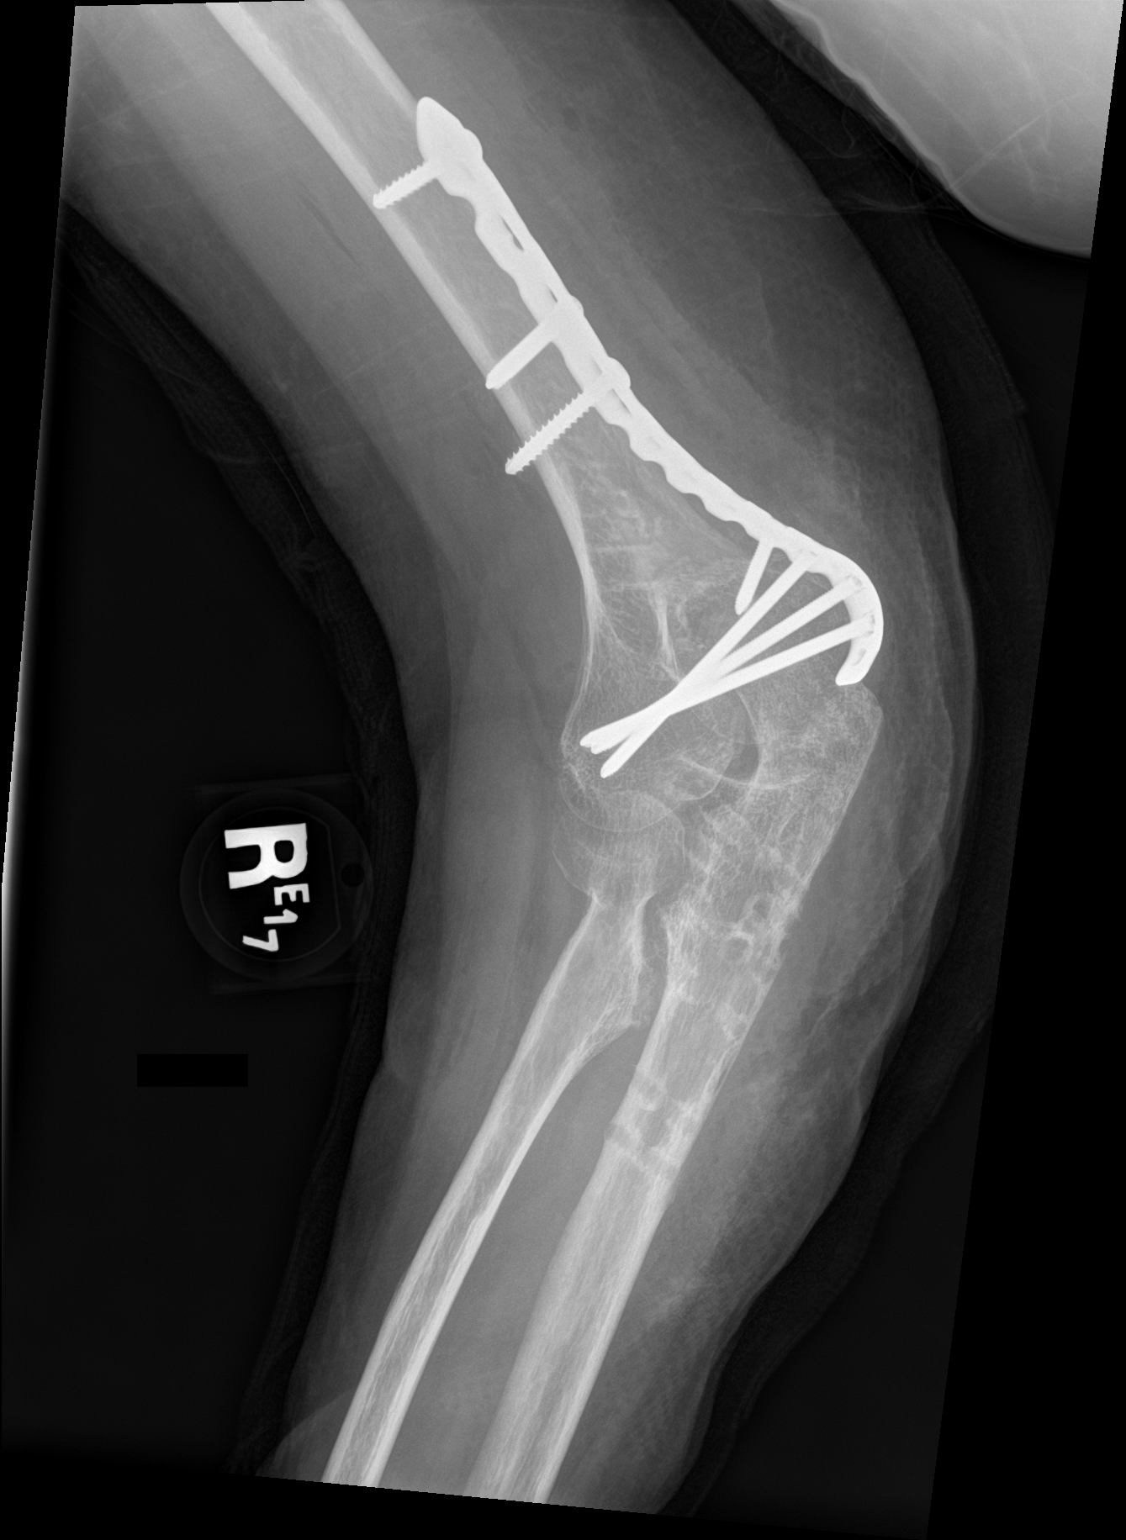

[3 of 3 positions shown; findings below may reference images not displayed]

FINDINGS: Frontal, oblique, and lateral views of the right elbow are obtained.
Postsurgical changes are seen from removal of hardware within the
olecranon. A malleable plate and screw fixation across the distal
humeral fracture is identified, with near anatomic alignment. The
bones are diffusely osteopenic. Postsurgical changes are seen in the
soft tissues.
IMPRESSION: 1. ORIF of a distal right humeral fracture, with near anatomic
alignment.
2. Hardware removal from the olecranon.
3. Diffuse soft tissue swelling and osteopenia.

## 2021-12-31 NOTE — Patient Instructions (Signed)

## 2022-01-02 NOTE — Progress Notes (Signed)
Subjective:  ? ?Patient ID: Casey Diaz, female   DOB: 56 y.o.   MRN: [id number]  ? ?HPI ?Patient presents stating that she is improving as far as the discomfort in the foot goes but still present to a moderate degree and also is complaining about a painful ingrown toenail of the left big toe.  She also discusses the instability that she experiences secondary to cerebral palsy ? ? ?ROS ? ? ?   ?Objective:  ?Physical Exam  ?Neurovascular status intact with discomfort right that has improved still discomfort if I put too much pressure on it but overall walking with a better heel toe gait with incurvated left hallux medial border.  Also is noted to have instability ? ?   ?Assessment:  ?Ingrown toenail deformity of the left hallux along with improved capsular inflammation right.  The pain is still present to a significant degree and instability still exists ? ?   ?Plan:  ?H&P reviewed both conditions recommended correction of ingrown toenail and explained procedure to patient.  She signed consent form understanding risk and today I infiltrated the left hallux 60 mg like Marcaine mixture sterile prep done and using sterile instrumentation I remove the border exposed matrix applied phenol 3 applications 30 seconds followed by alcohol lavage sterile dressing gave instructions on soaks and to leave dressing on 24 hours but take it off earlier if any throbbing were to occur.  Encouraged her to call with questions concerns.  Discussed again the instability of her ankle long-term history of cerebral palsy and ped orthotist who has recommended bracing in order to provide for better stability and hopefully reduce any chances of falls.  We are still working on this for her but does have long-term history of issues ?   ? ? ?

## 2022-01-03 ENCOUNTER — Ambulatory Visit: Payer: 59 | Admitting: Psychiatry

## 2022-01-03 NOTE — Progress Notes (Signed)
NURSE Visit: ?  ?Pt arrived for her weekly Spravato Treatment, she received  84 mg (3 of the 28 mg) Spravato nasal spray. 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 B/P at 3:35 PM 117/75, 87, Pt instructed to blow her nose and to recline back at 45 degrees. Pt given first nasal spray (28 mg) administered by pt observed by nurse. There were 5 minutes between each dose, total of 84 mg. Tolerated well. Pt's medication is delivered by [facility] in [place] and stored inside a safe behind a locked door as well. Spravato is a CIII medication and has to be only given at a treatment facility and never given to patient. ? Pt relaxes in recliner, no complaints voiced. No sedation noted but pt did have the lights off and listening to music on the TV. Pt does experience some dissociation but does clear up prior to discharge. Pt's 40 minute vital signs at 4:15 PM 121/83, 89. [name] comes to discuss her care at the end of her treatment when her thoughts are clearer. No complaints of a headache today after her treatment. Discharge vitals at 5:35 PM 115/81, 93. Pt stable for discharge. Pt reports she is unable to come again next week due to a ride issue. [name] aware. ?Pt was observed on site a total of 120 minutes per FDA/REMS requirements. Pt was with nurse for clinical assessment a total of 55 minutes.  ? ?LOT [id number] ?EXP 2024 JUL ?

## 2022-01-04 ENCOUNTER — Encounter: Payer: Self-pay | Admitting: Psychiatry

## 2022-01-04 ENCOUNTER — Other Ambulatory Visit: Payer: Self-pay

## 2022-01-04 ENCOUNTER — Ambulatory Visit (HOSPITAL_BASED_OUTPATIENT_CLINIC_OR_DEPARTMENT_OTHER): Payer: 59 | Admitting: Physical Therapy

## 2022-01-04 DIAGNOSIS — M5451 Vertebrogenic low back pain: Secondary | ICD-10-CM | POA: Diagnosis not present

## 2022-01-04 DIAGNOSIS — R2689 Other abnormalities of gait and mobility: Secondary | ICD-10-CM

## 2022-01-04 DIAGNOSIS — G8929 Other chronic pain: Secondary | ICD-10-CM

## 2022-01-04 DIAGNOSIS — R262 Difficulty in walking, not elsewhere classified: Secondary | ICD-10-CM

## 2022-01-04 DIAGNOSIS — M6281 Muscle weakness (generalized): Secondary | ICD-10-CM

## 2022-01-04 NOTE — Progress Notes (Signed)
Casey Diaz ?[id number] ?05-Jan-1966 ?56 y.o. ? ? ? ?Subjective:  ? ?Patient ID:  Casey Diaz is a 56 y.o. (DOB Jul 04, 1966) female. ? ?Chief Complaint:  ?Chief Complaint  ?Patient presents with  ? Follow-up  ? Depression  ? Anxiety  ? Fatigue  ? ? ? ?HPI ?Casey Diaz presents to the office today for follow-up of OCD and severe anxiety.   ?  ?December 2019 visit the following was noted: No meds were changed. ?Lives in [name] and back for followup.  Sx are about the same.  Has to take meds with different sizes. ?Pt reports that mood is Anxious and Depressed and describes anxiety as Severe. Anxiety symptoms include: Excessive Worry, ?Obsessive Compulsive Symptoms:   Checking,,. Pt reports has interrupted sleep and nocturia. Pt reports that appetite is good. Pt reports that energy is no change and down slightly. Concentration is down slightly. Suicidal thoughts:  denied by patient. ?Loves the environment of [name] but misses some things there.  She's not able to work there.  H works there and likes it.  Struggled with not working, feels isolated and not up to task of meeting people.  Does attend a church and met a friend who's been helpful.  Leaving for [name] on 10/16/18. ?  ?04/09/2020 appointment the following is noted: ? Staying another year in [name] bc Covid and other things. ?Last few months a lot of crying spells.  Is in menopause. ?Wonders about med changes though is nervous about it.  Crying spells associated with depressing thoughts more than stress or OCD.   ?Covid really hard on everyone and couldn't see family for 18 mos.  Family still very dysfunctional. ?No close friends in part due to OCD and depression. ?Son high Autism spectrum with ADHD and anxiety and she's with him all the time. ?Greater health problems with CP so more pains.  ? ?05/15/20 appt with the following noted: ?Started Estroven for menopause and helps some. ?Still depressed.  Chronically. ?In [place] for 2 more weeks then to [name] for  another year. ?A lot of stressors lately triggering more checking and anxiety.   ?OCD is her CC now and seems.  Got worse DT stress.   ?Stressed with Asberger's son and her health.  H works a lot.  Her FOO still stress. ?Plan: Trintellix 10 mg 1 tablet in the morning with food and reduce fluvoxamine to 5 tablets nightly for 1 week  ?then reduce it to 4 tablets nightly.  ? ?07/02/20 appt with the following noted: ?Decided not to get Trintellix bc difficulty getting it. ?It is available.  There.  Wants to start it now.   ?Both depression and OCD are severe.  Not suicidal in intent or plan. ?Did not take samples with her to [name] but will be back in December. ?covid is worse there and travel is difficult.  ?Wants to reduce Wellbutrin DT dry mouth. ?Plan: She's afraid to reduce Luvox at this time DT fear of worsening OCD.  But will consider. ?Trintellix 10 mg 1 tablet in the morning with food and reduce fluvoxamine to 5 tablets nightly for 1 week  ?then reduce it to 4 tablets nightly. ?Also reduce Wellbutrin XL to 300 mg daily.  ?  ?9-13 2022 appointment with the following noted: ?Back in [place] since July 14.  Broke arm a month ago and surgery.  It's all been rough adjustment.   ?B has cancer on his face and M fell taking him to the doctor.  Misses the water and weather of [place].   ?Cry a lot more since menopause. Still depression and anxiety and OCD.  Asks about ketamine. ?On Wellbutrin 300, Luvox 300.  No Trintellix. ?Added Ativan 2 mg AM and HS and it helps.  More likely to get upset at night. ?Plan: Increase Luvox back to 400 mg daily.  She thinks she's worse on less. ?Continue Wellbutrin XL to 300 mg daily. ?Plan to start Spravato for TRD asap  ? ?09/27/2021 appointment with the following noted:  ?She has started Spravato today at 54 mg intranasally.  She tolerated it well without unusual nausea or vomiting headache or other somatic symptoms.  She did have the expected dissociation which gradually resolved over  the course of the 2-hour period of observation.  She was a little concerned about her balance given her cerebral palsy but has not noted unusual or unexpected problems.  She is motivated to can continue Spravato in hopes of reducing her depressive symptoms. 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  She also has treatment resistant OCD which is partially managed with medications but is still quite disabling.  She is tolerating the medications well.  She is sleeping adequately.  Her appetite is adequate.  She is not having suicidal thoughts.  She continues to wish for a better treatment for OCD that would give her some relief. ? ?09/30/2021 appointment with the following noted: ?She received her first dose of Spravato 84 mg intranasally today.  She tolerated it well without unusual nausea, vomiting, or other somatic symptoms.  Dissociation as expected did occur and gradually resolved over the 2-hour period of observation.  She did have a mild headache today with the treatment and received ibuprofen 600 mg at her request.  We will follow this to see if it is a pattern ?Patient is still depressed.  She said she was late with her medicine today and today was a particularly depressing day.  However she notes that the Spravato has lifted her mood considerably even today.  She is hopeful that it will continue to be helpful.  No suicidal thoughts.  She has ongoing chronic anxiety and OCD at baseline. ? ?10/04/21 appt noted: ?Patient received Spravato 84 mg for the second time today.  She tolerated it well without any unusual headache, nausea or vomiting or other somatic symptoms.  Dissociation did occur and she gradually [place] resolution over the 2-hour period of observation. ?She did not have any unusual problems after she left the office last Spravato administration.  She did not have any specific problems with balance or walking.  She is at increased risk of that difficulty because of  cerebral palsy.  So far she has not noticed much mood effect from the medication beyond the first day of receiving it.  However she would like to continue Spravato in hopes of getting the antidepressant effect that is desired. ?Stress dealing with mother's behavior at party pt hosted.  Guilt over it. ? ?10/07/2021 appointment noted: ?Patient received Spravato 84 mg for the second time today.  She tolerated it well without any unusual headache, nausea or vomiting or other somatic symptoms.  Dissociation did occur and she gradually [place] resolution over the 2-hour period of observation. ?She still is not sure about the antidepressant effect of Spravato.  Events over the holidays and demands, make it difficult to assess.  She still notes that the OCD tends to worsen the depression and vice versa.  She tolerates the  Spravato well and wants to continue the trial. ? ?10/15/2021 appointment with the following noted: ?Patient received Spravato 84 mg for the second time today.  She tolerated it well without any unusual headache, nausea or vomiting or other somatic symptoms.  Dissociation did occur and she gradually [place] resolution over the 2-hour period of observation. ?Patient says it was somewhat difficult to evaluate the effect of the Spravato.  It was scheduled to be twice weekly for 4 weeks consecutively but the holidays have interfered with that administration.  She asked what specifically should be she should be looking for in order to assess improvement.  That was discussed.  The OCD is unchanged and the depression so far is not significantly different.  She still tolerates meds.  There have been no recent med changes ? ?10/19/2021 appt noted: ?Patient received Spravato 84 mg for the second today.  She tolerated it well without any unusual headache, nausea or vomiting or other somatic symptoms.  Dissociation did occur and she gradually saw resolution over the 2-hour period of observation.  ? ?10/21/2021 appointment  noted: ?Patient received Spravato 84 mg today.  She tolerated it well without any unusual headache, nausea or vomiting or other somatic symptoms.  Dissociation did occur and she gradually saw resolution over the

## 2022-01-04 NOTE — Progress Notes (Signed)
Casey Diaz ?[id number] ?[birth year] ?56 y.o. ? ? ? ?Subjective:  ? ?Patient ID:  Casey Diaz is a 56 y.o. (DOB [birth year]) female. ? ?Chief Complaint:  ?Chief Complaint  ?Patient presents with  ? Follow-up  ? Depression  ? Anxiety  ? Fatigue  ? ? ? ?HPI ?Casey Diaz presents to the office today for follow-up of OCD and severe anxiety.   ?  ?December 2019 visit the following was noted: No meds were changed. ?Lives in [place] and back for followup.  Sx are about the same.  Has to take meds with different sizes. ?Pt reports that mood is Anxious and Depressed and describes anxiety as Severe. Anxiety symptoms include: Excessive Worry, ?Obsessive Compulsive Symptoms:   Checking,,. Pt reports has interrupted sleep and nocturia. Pt reports that appetite is good. Pt reports that energy is no change and down slightly. Concentration is down slightly. Suicidal thoughts:  denied by patient. ?Loves the environment of [place] but misses some things there.  She's not able to work there.  H works there and likes it.  Struggled with not working, feels isolated and not up to task of meeting people.  Does attend a church and met a friend who's been helpful.  Leaving for [place] on 10/16/18. ?  ?04/09/2020 appointment the following is noted: ? Staying another year in [place] bc Covid and other things. ?Last few months a lot of crying spells.  Is in menopause. ?Wonders about med changes though is nervous about it.  Crying spells associated with depressing thoughts more than stress or OCD.   ?Covid really hard on everyone and couldn't see family for 18 mos.  Family still very dysfunctional. ?No close friends in part due to OCD and depression. ?Son high Autism spectrum with ADHD and anxiety and she's with him all the time. ?Greater health problems with CP so more pains.  ? ?05/15/20 appt with the following noted: ?Started Estroven for menopause and helps some. ?Still depressed.  Chronically. ?In [place] for 2 more weeks then to [place] for  another year. ?A lot of stressors lately triggering more checking and anxiety.   ?OCD is her CC now and seems.  Got worse DT stress.   ?Stressed with Asberger's son and her health.  H works a lot.  Her FOO still stress. ?Plan: Trintellix 10 mg 1 tablet in the morning with food and reduce fluvoxamine to 5 tablets nightly for 1 week  ?then reduce it to 4 tablets nightly.  ? ?07/02/20 appt with the following noted: ?Decided not to get Trintellix bc difficulty getting it. ?It is available.  There.  Wants to start it now.   ?Both depression and OCD are severe.  Not suicidal in intent or plan. ?Did not take samples with her to [place] but will be back in December. ?covid is worse there and travel is difficult.  ?Wants to reduce Wellbutrin DT dry mouth. ?Plan: She's afraid to reduce Luvox at this time DT fear of worsening OCD.  But will consider. ?Trintellix 10 mg 1 tablet in the morning with food and reduce fluvoxamine to 5 tablets nightly for 1 week  ?then reduce it to 4 tablets nightly. ?Also reduce Wellbutrin XL to 300 mg daily.  ?  ?9-13 2022 appointment with the following noted: ?Back in [place] since July 14.  Broke arm a month ago and surgery.  It's all been rough adjustment.   ?B has cancer on his face and M fell taking him to the doctor.  Misses the water and weather of [place].   ?Cry a lot more since menopause. Still depression and anxiety and OCD.  Asks about ketamine. ?On Wellbutrin 300, Luvox 300.  No Trintellix. ?Added Ativan 2 mg AM and HS and it helps.  More likely to get upset at night. ?Plan: Increase Luvox back to 400 mg daily.  She thinks she's worse on less. ?Continue Wellbutrin XL to 300 mg daily. ?Plan to start Spravato for TRD asap  ? ?09/27/2021 appointment with the following noted:  ?She has started Spravato today at 54 mg intranasally.  She tolerated it well without unusual nausea or vomiting headache or other somatic symptoms.  She did have the expected dissociation which gradually resolved over  the course of the 2-hour period of observation.  She was a little concerned about her balance given her cerebral palsy but has not noted unusual or unexpected problems.  She is motivated to can continue Spravato in hopes of reducing her depressive symptoms. 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  She also has treatment resistant OCD which is partially managed with medications but is still quite disabling.  She is tolerating the medications well.  She is sleeping adequately.  Her appetite is adequate.  She is not having suicidal thoughts.  She continues to wish for a better treatment for OCD that would give her some relief. ? ?09/30/2021 appointment with the following noted: ?She received her first dose of Spravato 84 mg intranasally today.  She tolerated it well without unusual nausea, vomiting, or other somatic symptoms.  Dissociation as expected did occur and gradually resolved over the 2-hour period of observation.  She did have a mild headache today with the treatment and received ibuprofen 600 mg at her request.  We will follow this to see if it is a pattern ?Patient is still depressed.  She said she was late with her medicine today and today was a particularly depressing day.  However she notes that the Spravato has lifted her mood considerably even today.  She is hopeful that it will continue to be helpful.  No suicidal thoughts.  She has ongoing chronic anxiety and OCD at baseline. ? ?10/04/21 appt noted: ?Patient received Spravato 84 mg for the second time today.  She tolerated it well without any unusual headache, nausea or vomiting or other somatic symptoms.  Dissociation did occur and she gradually [place] resolution over the 2-hour period of observation. ?She did not have any unusual problems after she left the office last Spravato administration.  She did not have any specific problems with balance or walking.  She is at increased risk of that difficulty because of  cerebral palsy.  So far she has not noticed much mood effect from the medication beyond the first day of receiving it.  However she would like to continue Spravato in hopes of getting the antidepressant effect that is desired. ?Stress dealing with mother's behavior at party pt hosted.  Guilt over it. ? ?10/07/2021 appointment noted: ?Patient received Spravato 84 mg for the second time today.  She tolerated it well without any unusual headache, nausea or vomiting or other somatic symptoms.  Dissociation did occur and she gradually [place] resolution over the 2-hour period of observation. ?She still is not sure about the antidepressant effect of Spravato.  Events over the holidays and demands, make it difficult to assess.  She still notes that the OCD tends to worsen the depression and vice versa.  She tolerates the  Spravato well and wants to continue the trial. ? ?10/15/2021 appointment with the following noted: ?Patient received Spravato 84 mg for the second time today.  She tolerated it well without any unusual headache, nausea or vomiting or other somatic symptoms.  Dissociation did occur and she gradually [place] resolution over the 2-hour period of observation. ?Patient says it was somewhat difficult to evaluate the effect of the Spravato.  It was scheduled to be twice weekly for 4 weeks consecutively but the holidays have interfered with that administration.  She asked what specifically should be she should be looking for in order to assess improvement.  That was discussed.  The OCD is unchanged and the depression so far is not significantly different.  She still tolerates meds.  There have been no recent med changes ? ?10/19/2021 appt noted: ?Patient received Spravato 84 mg for the second today.  She tolerated it well without any unusual headache, nausea or vomiting or other somatic symptoms.  Dissociation did occur and she gradually saw resolution over the 2-hour period of observation.  ? ?10/21/2021 appointment  noted: ?Patient received Spravato 84 mg today.  She tolerated it well without any unusual headache, nausea or vomiting or other somatic symptoms.  Dissociation did occur and she gradually saw resolution over the

## 2022-01-04 NOTE — Therapy (Signed)
?OUTPATIENT PHYSICAL THERAPY TREATMENT NOTE ? ? ?Patient Name: Casey Diaz ?MRN: [id number] ?DOB:Dec 19, 1965, 56 y.o., female ?Today's Date: 01/04/2022 ? ?PCP: Creola Cornusso, John, MD ?REFERRING PROVIDER: Creola Cornusso, John, MD ? ? PT End of Session - 01/04/22 1538   ? ? Visit Number 4   ? Number of Visits 12   ? Date for PT Re-Evaluation 01/26/22   ? Progress Note Due on Visit 10   ? PT Start Time 1534   ? PT Stop Time 1617   ? PT Time Calculation (min) 43 min   ? Activity Tolerance Patient tolerated treatment well   ? [facility] for tasks assessed/performed   ? ?  ?  ? ?  ? ? ? ?Past Medical History:  ?Diagnosis Date  ? Abnormal Pap smear 2011  ? hpv/mild dysplasia,cin1  ? Anxiety   ? Cerebral palsy (HCC)   ? right arm/leg  ? Cystocele   ? Depression   ? Headache   ? Neuromuscular disorder (HCC)   ? Cerebral Palsy  ? OCD (obsessive compulsive disorder)   ? Osteoporosis   ? Uterine prolaps   ? ?Past Surgical History:  ?Procedure Laterality Date  ? COLPOSCOPY  2011  ? ELBOW SURGERY    ? left elbow-separation of bones -age 56  ? ELBOW SURGERY  2015  ? fell on ice- right elbow fracture  ? FRACTURE SURGERY    ? HAMMER TOE SURGERY  1/13  ? RIGHT SIDE  ? HIP PINNING,CANNULATED Left 07/08/2016  ? Procedure: LEFT CLOSED REDUCTION HIP AND PERCUTANEOUS SCREW;  Surgeon: Durene RomansMatthew Olin, MD;  Location: [facility];  Service: Orthopedics;  Laterality: Left;  ? ORIF HUMERUS FRACTURE Right 06/11/2021  ? Procedure: OPEN REDUCTION INTERNAL FIXATION (ORIF) DISTAL HUMERUS FRACTURE;  Surgeon: Myrene GalasHandy, Michael, MD;  Location: [facility];  Service: Orthopedics;  Laterality: Right;  ? WRIST SURGERY  2005  ? left wrist  ? ?Patient Active Problem List  ? Diagnosis Date Noted  ? 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HCC) 08/03/2018  ? Relationship problem with family member 08/03/2018  ? Femoral neck fracture, left, closed, initial encounter 07/08/2016  ? Severe recurrent major depression without psychotic features  (HCC) 06/03/2015  ?  Class: Chronic  ? Obsessive-compulsive disorder 06/03/2015  ?  Class: Chronic  ? ? ?REFERRING DIAG: M54.51 (ICD-10-CM) - Vertebrogenic low back pain  ? ?THERAPY DIAG:  ?Chronic left-sided low back pain without sciatica ? ?Muscle weakness (generalized) ? ?Difficulty in walking, not elsewhere classified ? ?Other abnormalities of gait and mobility ? ?PERTINENT HISTORY: CP ?OPEN REDUCTION INTERNAL FIXATION (ORIF) DISTAL HUMERUS FRACTURE ?IM nail Left hip 2017 ?Osteoporosis ? ?PRECAUTIONS: fall ? ?SUBJECTIVE: Pt reports adjustment on sat.  Decreased pain ? ?PAIN:  ?Are you having pain? Yes ?NPRS scale:1-2 /10 ?Pain location: ankle/LB ?Pain orientation: R ?PAIN TYPE: aching ?Pain description: constant  ?Aggravating factors: walking  ?Relieving factors: rest ?   ?PRECAUTIONS: Fall ?  ?WEIGHT BEARING RESTRICTIONS No ?  ?FALLS:  ?Has patient fallen in last 6 months? No, Number of falls: 1 in Aug 2022 ?  ?LIVING ENVIRONMENT: ?Lives with: lives with their family ?Lives in: House/apartment ?Stairs: Yes; Internal: 15 steps; none ?Has following equipment at home: None ?  ?  ?PLOF: Independent with household mobility without device ?  ?PATIENT GOALS to get stronger, more coordinated and better balanced. ?  ?  ?OBJECTIVE:  ?  ?  ?PATIENT SURVEYS:  ?FOTO 44 with goal of 50 ?  ?  ?  COGNITION: ?         Overall cognitive status: Within functional limits for tasks assessed              ?          ?SENSATION: ?         intact ?  ?MUSCLE LENGTH: ?Hamstrings: Right 70 deg; Left 90 deg ?Thomas test: Right neg deg; Left neg deg ?  ?POSTURE:  ? left shoulder elevation, weightbearing shifted left ?  ?PALPATION: ?Tenderness bilat sub occipital and upper traps ?  ?LUMBARAROM/PROM ?  ?A/PROM A/PROM  ?12/15/2021  ?Flexion Forward reach 4 inches to floor  ?Extension    ?Right lateral flexion    ?Left lateral flexion    ?Right rotation    ?Left rotation    ? (Blank rows = not tested) ?  ?LE AROM/PROM: ?  ?A/PROM Right ?12/15/2021  Left ?12/15/2021  ?Hip flexion 90 105  ?Hip extension      ?Hip abduction 25 30  ?Hip adduction      ?Hip internal rotation      ?Hip external rotation      ?Knee flexion 105 120  ?Knee extension      ?Ankle dorsiflexion 5    ?Ankle plantarflexion 10    ?Ankle inversion      ?Ankle eversion      ? (Blank rows = not tested) ?  ?LE MMT: ?  ?MMT Right ?12/15/2021 Left ?12/15/2021  ?Hip flexion 5 4  ?Hip extension 4 4  ?Hip abduction 5 5  ?Hip adduction 5 5  ?Hip internal rotation      ?Hip external rotation      ?Knee flexion 5 5  ?Knee extension      ?Ankle dorsiflexion      ?Ankle plantarflexion      ?Ankle inversion      ?Ankle eversion      ? (Blank rows = not tested) ?  ?LUMBAR SPECIAL TESTS:  ?Straight leg raise test: Negative, Slump test: Negative, and Thomas test: Negative ?  ?FUNCTIONAL TESTS:  ?5 times sit to stand: 20 ?Timed up and go (TUG): 13 ?Berg Balance Scale: 36/56   ?  ?GAIT: ?Distance walked: 536ft ?Assistive device utilized: None ?Level of assistance: Complete Independence ?Comments: right trendelenburg, left leaning, no heel strike or knee extension right, shortened step length ?  ?  ?  ?  ?TODAY'S TREATMENT  ?Pt seen for aquatic therapy today.  Treatment took place in water 3.25-4.8 ft in depth at the [facility] pool. Temp of water was 91?.  Pt entered/exited the pool via stairs (step through pattern) independently with bilat rail. ? ?Warm up:  ?Multiple widths of walking forward, backward, and sidestepping without UE support ?Seated: ?Flutter kicking seated on 3rd step (bottom) 2 x 20 ?Add/abd 2x20, 4th step ?STS from 3rd step x 10 then x5 no ue support needed.  Cues for immediate standing balance, cues for eccentric lowering  ? ?Forward  step ups R/L x10. Initiate with ue support progressed to no UE support. Cues for core stabilization ?Slow marching dumbbells for support x10 ?UE shoulder flex/ext; add/abd and horizontal abd x10. Cues for relaxed shoulder positioning. ?Cervical ROM all  directions all plane.  Min discomfort at end range right rotation. ? ? ?Pt requires buoyancy for support and to offload joints with strengthening exercises. Viscosity of the water is needed for resistance of strengthening; water current perturbations provides challenge to standing balance unsupported, requiring increased core activation. ? ?  ?  ?  PATIENT EDUCATION:  ?Education details: Condition management; Properties of water; benefit of aquatic therapy ?Person educated: Patient ?Education method: Explanation ?Education comprehension: verbalized understanding ?  ?  ?HOME EXERCISE PROGRAM: ?Access Code: [email or web address] ?URL: [email or web address] ?Date: 12/15/2021 ?Prepared by: Geni Bers ?  ?Exercises ?Supine Lower Trunk Rotation - 1 x daily - 7 x weekly - 3 sets - 10 reps ?Supine Single Knee to Chest Stretch - 1 x daily - 7 x weekly - 3 sets - 10 reps ?  ?ASSESSMENT: ?  ?CLINICAL IMPRESSION: ?Pt reporting decrease in overall pain today "have days like this". ROM cervical spine wfl with minimal pain in right rotation end range.  Progressed with UE strengthening with cues for core control. Pt with some challenge engaging rue due to elbow contracture/tone from CP.  Modified as able to complete. She does complain of some left hip and LB discomfort with added strengthening components of STS unassisted by UE and focus on post chain with rising and mini squats resolving after 10 mins in jacuzzi with water massage (unbilled). She is progressing nicely towards goals.  ? ?  ?  ?OBJECTIVE IMPAIRMENTS Abnormal gait, decreased activity tolerance, decreased balance, decreased coordination, decreased mobility, difficulty walking, decreased strength, and pain.  ?  ?ACTIVITY LIMITATIONS cleaning, community activity, yard work, and shopping.  ?  ?PERSONAL FACTORS Time since onset of injury/illness/exacerbation and 3+ comorbidities:    are also affecting patient's functional outcome.  ?  ?  ?REHAB POTENTIAL: Good ?   ?CLINICAL DECISION MAKING: Evolving/moderate complexity ?  ?EVALUATION COMPLEXITY: Moderate ?  ?  ?GOALS: ?Goals reviewed with patient? Yes ?  ?SHORT TERM GOALS: ?  ?STG Name Target Date Goal status  ?1 Pt will toler

## 2022-01-06 ENCOUNTER — Ambulatory Visit: Payer: 59 | Admitting: Psychiatry

## 2022-01-06 ENCOUNTER — Other Ambulatory Visit: Payer: Self-pay | Admitting: Psychiatry

## 2022-01-06 NOTE — Telephone Encounter (Signed)
Please review

## 2022-01-06 NOTE — Telephone Encounter (Signed)
Her OCD is very severe.  It is crucial that she remain on for fluvoxamine daily.  She mentioned the cost for 4 daily was $168.  I do not know if that is for 1 month or 3 months.  However if necessary I can write the prescription for 3 daily to send for her insurance to fill and for the remaining 1 daily she can get 90 tablets using good Rx from the following pharmacy of her choice: ? ?[facility] ?[facility] ?retail ?Save 24% ?$ ?29.55 ?[facility] ?[facility] ?$112 ?retail ?Save 73% ?$ ?29.95 ?[facility] ?$ ?43.24 ?[facility] ?$ ?43.24 ?[facility] ?[facility] ?[phone] ?retail ?Save 64% ?$ ?43.96 ?[facility] ?$[facility] ?retail ?Save 77% ?$ ?46.65 ?

## 2022-01-06 NOTE — Telephone Encounter (Signed)
Casey Diaz called today at 1:35 to report that her insurance will not pay for Luvox 4/day.  They will only cover 3/day and the cost of 4/day is $168 which is too expensive. Please reduce the dose to 3/day and send in new prescription.  Next appt 3/30 ?

## 2022-01-07 ENCOUNTER — Other Ambulatory Visit: Payer: Self-pay | Admitting: Psychiatry

## 2022-01-07 ENCOUNTER — Ambulatory Visit (HOSPITAL_BASED_OUTPATIENT_CLINIC_OR_DEPARTMENT_OTHER): Payer: Self-pay | Admitting: Physical Therapy

## 2022-01-07 DIAGNOSIS — F4010 Social phobia, unspecified: Secondary | ICD-10-CM

## 2022-01-07 DIAGNOSIS — F339 Major depressive disorder, recurrent, unspecified: Secondary | ICD-10-CM

## 2022-01-07 DIAGNOSIS — F422 Mixed obsessional thoughts and acts: Secondary | ICD-10-CM

## 2022-01-07 MED ORDER — FLUVOXAMINE MALEATE 100 MG PO TABS: 300.0000 mg | ORAL_TABLET | Freq: Every day | ORAL | 0 refills | Status: DC

## 2022-01-07 MED ORDER — FLUVOXAMINE MALEATE 100 MG PO TABS: 100.0000 mg | ORAL_TABLET | Freq: Every day | ORAL | 0 refills | Status: DC

## 2022-01-07 NOTE — Telephone Encounter (Signed)
Patient called back and said the 2 script option is ok, 3/day so insurance will cover and then the 1/day for her to pay OOP.   ?

## 2022-01-07 NOTE — Telephone Encounter (Signed)
Sent both RX for Luvox 300mg /D and another for 100mg /D with latter to be filled with [facility] ?

## 2022-01-07 NOTE — Telephone Encounter (Signed)
I called patient with the pricing information and she could not decide. The $168 is for a 30-day supply. She called the office back and said she has appts this afternoon and to just leave a message. She only has a day of medication left.  She said if you send a Rx in for [facility] to please note on script that it is [facility].  It sounds like your option of 2 scripts might be the best option until we have time to do a PA. She scheduled a Spravato appt for Monday.  ?

## 2022-01-07 NOTE — Telephone Encounter (Signed)
LVM to RC 

## 2022-01-10 ENCOUNTER — Other Ambulatory Visit: Payer: Self-pay

## 2022-01-10 ENCOUNTER — Ambulatory Visit: Payer: 59 | Admitting: Psychiatry

## 2022-01-10 ENCOUNTER — Ambulatory Visit: Payer: 59

## 2022-01-10 ENCOUNTER — Ambulatory Visit (INDEPENDENT_AMBULATORY_CARE_PROVIDER_SITE_OTHER): Payer: 59 | Admitting: Behavioral Health

## 2022-01-10 VITALS — BP 99/60 | HR 85

## 2022-01-10 DIAGNOSIS — F339 Major depressive disorder, recurrent, unspecified: Secondary | ICD-10-CM | POA: Diagnosis not present

## 2022-01-10 NOTE — Progress Notes (Signed)
Casey Diaz ?[id number] ?Feb 25, 1966 ?56 y.o. ? ?Subjective:  ? ?Patient ID:  Casey Diaz is a 56 y.o. (DOB 1966-08-09) female. ? ?Chief Complaint: No chief complaint on file. ? ? ?HPI ? Casey Diaz presents today for Sprivato  treatment today.  ? ?Patient was administered Spravato 84 mg intranasally today. She was observed by provider throughout [facility] treatment. The patient again, experienced significant dissociation - out of body experience - "nothing concerning to me",  which gradually resolved over the 2-hour period of observation. There were no complications. Specifically , the patient did not have any prolonged side effects - feeling disconnected from herself, her thoughts, feelings and things around her, dizziness, nausea, feeling sleepy, decreased feeling of sensitivity (numbness)spinning sensation, feeling anxious, lack of energy, increased blood pressure, nausea, vomiting, feeling drunk, or feeling happy or very excited, nausea or vomiting or headache. Blood pressures remained within normal ranges at the 40-minute and 2-hour follow-up intervals. By the time the 2-hour observation period was met the patient was alert and oriented and able to exit without assistance. 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 See nursing note for further details.   ? ? ?ECT-MADRS   ? ?[place] Office Visit from 06/29/2021 in [facility]  ?MADRS Total Score 36  ? ?  ? ?Flowsheet Row Admission (Discharged) from 06/11/2021 in [place]  ?C-SSRS RISK CATEGORY No Risk  ? ?  ?  ? ?Review of Systems:  ?Review of Systems  ?Constitutional: Negative.   ?Cardiovascular:  Negative for palpitations.  ?Allergic/Immunologic: Negative.   ?Neurological: Negative.  Negative for tremors and weakness.  ?Psychiatric/Behavioral: Negative.    ? ?Medications: I have reviewed the patient's current  medications. ? ?Current Outpatient Medications  ?Medication Sig Dispense Refill  ? Abaloparatide (TYMLOS) 3120 MCG/1.56ML SOPN Inject into the skin.    ? Azelastine-Fluticasone 137-50 MCG/ACT SUSP Place 1-2 sprays into both nostrils daily.    ? baclofen (LIORESAL) 10 MG tablet Take 20 mg by mouth at bedtime as needed for muscle spasms.    ? buPROPion (WELLBUTRIN XL) 150 MG 24 hr tablet Take 3 tablets (450 mg total) by mouth daily. 270 tablet 0  ? buPROPion (WELLBUTRIN XL) 300 MG 24 hr tablet TAKE 1 TABLET BY MOUTH EVERY DAY 90 tablet 0  ? dicyclomine (BENTYL) 10 MG capsule Take 10 mg by mouth daily.    ? docusate sodium (COLACE) 100 MG capsule Take 1 capsule (100 mg total) by mouth 2 (two) times daily. (Patient taking differently: Take 100 mg by mouth daily.) 10 capsule 0  ? Esketamine HCl, 84 MG Dose, (SPRAVATO, 84 MG DOSE,) 28 MG/DEVICE SOPK USE NASALLY AS DIRECTED ON PAKCAGE LABEL TWICE A WEEK 3 each 0  ? fexofenadine (ALLEGRA) 180 MG tablet Take 180 mg by mouth daily.    ? fluvoxaMINE (LUVOX) 100 MG tablet Take 3 tablets (300 mg total) by mouth at bedtime. 270 tablet 0  ? fluvoxaMINE (LUVOX) 100 MG tablet Take 1 tablet (100 mg total) by mouth at bedtime. 90 tablet 0  ? hydrocortisone (ANUSOL-HC) 2.5 % rectal cream Place rectally 2 (two) times daily. x 7-14 days 30 g 0  ? ketotifen (ZADITOR) 0.025 % ophthalmic solution Place 3 drops into both eyes at bedtime.    ? LORazepam (ATIVAN) 1 MG tablet TAKE 2 TABLETS (2 MG TOTAL) BY MOUTH 2 (TWO) TIMES DAILY. 90 tablet 3  ? magnesium gluconate (  MAGONATE) 500 MG tablet Take 500 mg by mouth daily.    ? MIBELAS 24 FE 1-20 MG-MCG(24) CHEW Chew 1 tablet by mouth at bedtime as needed (bowel regularity).    ? Multiple Vitamins-Minerals (ADULT GUMMY PO) Take 2 tablets by mouth in the morning.    ? nitrofurantoin (MACRODANTIN) 100 MG capsule Take 100 mg by mouth as needed (For urinary tract infection.).     ? oxyCODONE-acetaminophen (PERCOCET/ROXICET) 5-325 MG tablet Take 1-2  tablets by mouth every 6 (six) hours as needed for severe pain. 50 tablet 0  ? polyethylene glycol (MIRALAX / GLYCOLAX) packet Take 17 g by mouth daily as needed for mild constipation. 14 each 0  ? psyllium (METAMUCIL) 58.6 % powder Take 1 packet by mouth daily as needed (constipation).    ? temazepam (RESTORIL) 30 MG capsule Take 1 capsule (30 mg total) by mouth at bedtime as needed for sleep. 30 capsule 5  ? Vitamin D-Vitamin K (VITAMIN K2-VITAMIN D3 PO) Take 1-2 sprays by mouth daily.    ? ?No current facility-administered medications for this visit.  ? ? ?Medication Side Effects: None ? ?Allergies:  ?Allergies  ?Allergen Reactions  ? Hydrocodone Itching  ? Sulfamethoxazole-Trimethoprim Itching  ? Dust Mite Extract Other (See Comments)  ?  Sneezing, watery eyes, runny nose  ? Latex Itching  ? Other Other (See Comments)  ?  PT IS ALLERGIC TO CAT DANDER AND RAGWEED - Sneezing, watery eyes, runny nose ?  ? Pollen Extract Other (See Comments)  ?  Sneezing, watery eyes, runny nose ?  ? ? ?Past Medical History:  ?Diagnosis Date  ? Abnormal Pap smear 2011  ? hpv/mild dysplasia,cin1  ? Anxiety   ? Cerebral palsy ([place])   ? right arm/leg  ? Cystocele   ? Depression   ? Headache   ? Neuromuscular disorder ([place])   ? Cerebral Palsy  ? OCD (obsessive compulsive disorder)   ? Osteoporosis   ? Uterine prolaps   ? ? ?Past Medical History, Surgical history, Social history, and Family history were reviewed and updated as appropriate.  ? ?Please see review of systems for further details on the patient's review from today.  ? ?Objective:  ? ?Physical Exam:  ?LMP  (LMP Unknown)  ? ?Physical Exam ?Psychiatric:     ?   Attention and Perception: Attention and perception normal.     ?   Mood and Affect: Mood and affect normal.     ?   Speech: Speech normal.     ?   Behavior: Behavior normal. Behavior is cooperative.     ?   Cognition and Memory: Cognition and memory normal.     ?   Judgment: Judgment normal.  ? ? ?Lab Review:  ?    ?Component Value Date/Time  ? NA 138 06/11/2021 0606  ? K 4.0 06/11/2021 0606  ? CL 107 06/11/2021 0606  ? CO2 26 06/11/2021 0606  ? GLUCOSE 90 06/11/2021 0606  ? BUN 18 06/11/2021 0606  ? CREATININE 0.81 06/11/2021 0606  ? CALCIUM 9.4 06/11/2021 0606  ? PROT 6.5 06/11/2021 0606  ? ALBUMIN 3.3 (L) 06/11/2021 0606  ? AST 17 06/11/2021 0606  ? ALT 14 06/11/2021 0606  ? ALKPHOS 141 (H) 06/11/2021 0606  ? BILITOT 0.2 (L) 06/11/2021 0606  ? GFRNONAA >60 06/11/2021 0606  ? GFRAA >60 07/09/2016 0438  ? ? ?   ?Component Value Date/Time  ? WBC 5.8 06/11/2021 0606  ? RBC 4.12 06/11/2021 0606  ?  HGB 12.5 06/11/2021 0606  ? HCT 39.7 06/11/2021 0606  ? PLT 299 06/11/2021 0606  ? MCV 96.4 06/11/2021 0606  ? MCH 30.3 06/11/2021 0606  ? MCHC 31.5 06/11/2021 0606  ? RDW 13.9 06/11/2021 0606  ? LYMPHSABS 1.9 06/11/2021 0606  ? MONOABS 0.5 06/11/2021 0606  ? EOSABS 0.1 06/11/2021 0606  ? BASOSABS 0.0 06/11/2021 0606  ? ? ?No results found for: POCLITH, LITHIUM  ? ?No results found for: PHENYTOIN, PHENOBARB, VALPROATE, CBMZ  ? ?.res ?Assessment: Plan:   ? ?There are no diagnoses linked to this encounter.  ? ?Plan: ? ?Continue: ?  ?Spravato 84 administrations weekly ?   ?Patient advised to contact office with any questions, adverse effects, or acute worsening in signs and symptoms. ?  ? ? ? ?Please see After Visit Summary for patient specific instructions. ? ?Future Appointments  ?Date Time Provider [place]  ?01/18/2022 11:00 AM Carlson-Long, Stephani Police [facility]  ?01/20/2022 12:00 PM Vedia Pereyra, PT [facility]  ?01/25/2022 11:45 AM Lorina Rabon, Stephani Police [facility]  ?01/27/2022 11:45 AM Lorina Rabon, Stephani Police [facility]  ?02/01/2022 11:45 AM Lorina Rabon, Stephani Police [facility]  ?02/03/2022 12:00 PM Vedia Pereyra, PT [facility]  ?02/11/2022 11:00 AM Blanchie Serve, PhD [facility] None  ?03/07/2022  1:00 PM Mitchum, Herbie [place], PhD [facility] None  ? ? ?No orders of the defined types were placed in this  encounter. ? ? ?------------------------------- ?

## 2022-01-13 ENCOUNTER — Ambulatory Visit: Payer: 59 | Admitting: Psychiatry

## 2022-01-13 NOTE — Progress Notes (Signed)
NURSE Visit: ?  ?Pt arrived for her weekly Spravato Treatment, she received  84 mg (3 of the 28 mg) Spravato nasal spray. 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 She was not able to come for a treatment last week due to transportation issues. Her B/P at 3:15 PM 128/84, 98, Pt instructed to blow her nose and to recline back at 45 degrees. Pt given first nasal spray (28 mg) administered by pt observed by nurse. There were 5 minutes between each dose, total of 84 mg. Tolerated well. Pt's medication is delivered by [facility] in [place] and stored inside a safe behind a locked door as well. Spravato is a CIII medication and has to be only given at a treatment facility and never given to patient. ? Pt relaxes in recliner, no complaints voiced. No sedation noted but pt did have the lights off and listening to music on the TV. Pt does experience some dissociation but does clear up prior to discharge. Pt's 40 minute vital signs at 3:55 PM 136/97, 88. [name] comes to discuss her care at the end of her treatment when her thoughts are clearer. No complaints of a headache today after her treatment. Discharge vitals at 5:15 PM 99/60, 85. Pt stable for discharge. Pt's next visit is Monday, April 3 rd.  ?Pt was observed on site a total of 120 minutes per FDA/REMS requirements. Pt was with nurse for clinical assessment a total of 55 minutes.  ?  ?LOT [id number] ?EXP 2024 JUL ?

## 2022-01-17 ENCOUNTER — Ambulatory Visit: Payer: 59 | Admitting: Psychiatry

## 2022-01-18 ENCOUNTER — Encounter (HOSPITAL_BASED_OUTPATIENT_CLINIC_OR_DEPARTMENT_OTHER): Payer: Self-pay | Admitting: Physical Therapy

## 2022-01-18 ENCOUNTER — Ambulatory Visit (HOSPITAL_BASED_OUTPATIENT_CLINIC_OR_DEPARTMENT_OTHER): Payer: 59 | Attending: Orthopedic Surgery | Admitting: Physical Therapy

## 2022-01-18 DIAGNOSIS — R2689 Other abnormalities of gait and mobility: Secondary | ICD-10-CM | POA: Diagnosis present

## 2022-01-18 DIAGNOSIS — M545 Low back pain, unspecified: Secondary | ICD-10-CM | POA: Diagnosis present

## 2022-01-18 DIAGNOSIS — M6281 Muscle weakness (generalized): Secondary | ICD-10-CM | POA: Diagnosis present

## 2022-01-18 DIAGNOSIS — R262 Difficulty in walking, not elsewhere classified: Secondary | ICD-10-CM | POA: Diagnosis present

## 2022-01-18 DIAGNOSIS — M5459 Other low back pain: Secondary | ICD-10-CM | POA: Insufficient documentation

## 2022-01-18 DIAGNOSIS — G8929 Other chronic pain: Secondary | ICD-10-CM | POA: Insufficient documentation

## 2022-01-18 NOTE — Therapy (Signed)
?OUTPATIENT PHYSICAL THERAPY TREATMENT NOTE ? ? ?Patient Name: Casey Diaz ?MRN: [id number] ?DOB:[birth year], 56 y.o., female ?Today's Date: 01/18/2022 ? ?PCP: Creola Corn, MD ?REFERRING PROVIDER: Creola Corn, MD ? ? PT End of Session - 01/18/22 1101   ? ? Visit Number 5   ? Number of Visits 12   ? Date for PT Re-Evaluation 01/26/22   ? Authorization - Visit Number 5   ? Progress Note Due on Visit 10   ? PT Start Time 1100   ? PT Stop Time 1142   ? PT Time Calculation (min) 42 min   ? Activity Tolerance Patient tolerated treatment well   ? Behavior During Therapy [facility] for tasks assessed/performed   ? ?  ?  ? ?  ? ? ? ?Past Medical History:  ?Diagnosis Date  ? Abnormal Pap smear 2011  ? hpv/mild dysplasia,cin1  ? Anxiety   ? Cerebral palsy (HCC)   ? right arm/leg  ? Cystocele   ? Depression   ? Headache   ? Neuromuscular disorder (HCC)   ? Cerebral Palsy  ? OCD (obsessive compulsive disorder)   ? Osteoporosis   ? Uterine prolaps   ? ?Past Surgical History:  ?Procedure Laterality Date  ? COLPOSCOPY  2011  ? ELBOW SURGERY    ? left elbow-separation of bones -age 73  ? ELBOW SURGERY  2015  ? fell on ice- right elbow fracture  ? FRACTURE SURGERY    ? HAMMER TOE SURGERY  1/13  ? RIGHT SIDE  ? HIP PINNING,CANNULATED Left 07/08/2016  ? Procedure: LEFT CLOSED REDUCTION HIP AND PERCUTANEOUS SCREW;  Surgeon: Durene Romans, MD;  Location: [facility];  Service: Orthopedics;  Laterality: Left;  ? ORIF HUMERUS FRACTURE Right 06/11/2021  ? Procedure: OPEN REDUCTION INTERNAL FIXATION (ORIF) DISTAL HUMERUS FRACTURE;  Surgeon: Myrene Galas, MD;  Location: [facility];  Service: Orthopedics;  Laterality: Right;  ? WRIST SURGERY  2005  ? left wrist  ? ?Patient Active Problem List  ? Diagnosis Date Noted  ? 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HCC) 08/03/2018  ? Relationship problem with family member 08/03/2018  ? Femoral neck fracture, left, closed, initial encounter 07/08/2016  ? Severe recurrent major  depression without psychotic features (HCC) 06/03/2015  ?  Class: Chronic  ? Obsessive-compulsive disorder 06/03/2015  ?  Class: Chronic  ? ? ?REFERRING DIAG: M54.51 (ICD-10-CM) - Vertebrogenic low back pain  ? ?THERAPY DIAG:  ?Chronic left-sided low back pain without sciatica ? ?Muscle weakness (generalized) ? ?Difficulty in walking, not elsewhere classified ? ?Other abnormalities of gait and mobility ? ?PERTINENT HISTORY: CP ?OPEN REDUCTION INTERNAL FIXATION (ORIF) DISTAL HUMERUS FRACTURE ?IM nail Left hip 2017 ?Osteoporosis ? ?PRECAUTIONS: fall ? ?SUBJECTIVE: Pt reports no new changes since last visit.  She would like to increase her overall strength.  ? ?PAIN:  ?Are you having pain? Yes ?NPRS scale:4/10 ?Pain location: neck/ ankle  ?Pain orientation: R (ankle), bilat (neck) ?PAIN TYPE: aching ?Pain description: constant  ?Aggravating factors: walking  ?Relieving factors: rest ?   ?PRECAUTIONS: Fall ?  ?WEIGHT BEARING RESTRICTIONS No ?  ?FALLS:  ?Has patient fallen in last 6 months? No, Number of falls: 1 in Aug 2022 ?  ?LIVING ENVIRONMENT: ?Lives with: lives with their family ?Lives in: House/apartment ?Stairs: Yes; Internal: 15 steps; none ?Has following equipment at home: None ?  ?  ?PLOF: Independent with household mobility without device ?  ?PATIENT GOALS to get stronger, more coordinated and better  balanced. ?  ?  ?OBJECTIVE:  ?  ?  ?PATIENT SURVEYS:  ?FOTO 44 with goal of 50 ?  ?  ?COGNITION: ?         Overall cognitive status: Within functional limits for tasks assessed              ?          ?SENSATION: ?         intact ?  ?MUSCLE LENGTH: ?Hamstrings: Right 70 deg; Left 90 deg ?Thomas test: Right neg deg; Left neg deg ?  ?POSTURE:  ? left shoulder elevation, weightbearing shifted left ?  ?PALPATION: ?Tenderness bilat sub occipital and upper traps ?  ?LUMBARAROM/PROM ?  ?A/PROM A/PROM  ?12/15/2021  ?Flexion Forward reach 4 inches to floor  ?Extension    ?Right lateral flexion    ?Left lateral flexion     ?Right rotation    ?Left rotation    ? (Blank rows = not tested) ?  ?LE AROM/PROM: ?  ?A/PROM Right ?12/15/2021 Left ?12/15/2021  ?Hip flexion 90 105  ?Hip extension      ?Hip abduction 25 30  ?Hip adduction      ?Hip internal rotation      ?Hip external rotation      ?Knee flexion 105 120  ?Knee extension      ?Ankle dorsiflexion 5    ?Ankle plantarflexion 10    ?Ankle inversion      ?Ankle eversion      ? (Blank rows = not tested) ?  ?LE MMT: ?  ?MMT Right ?12/15/2021 Left ?12/15/2021  ?Hip flexion 5 4  ?Hip extension 4 4  ?Hip abduction 5 5  ?Hip adduction 5 5  ?Hip internal rotation      ?Hip external rotation      ?Knee flexion 5 5  ?Knee extension      ?Ankle dorsiflexion      ?Ankle plantarflexion      ?Ankle inversion      ?Ankle eversion      ? (Blank rows = not tested) ?  ?LUMBAR SPECIAL TESTS:  ?Straight leg raise test: Negative, Slump test: Negative, and Thomas test: Negative ?  ?FUNCTIONAL TESTS:  ?5 times sit to stand: 20 ?Timed up and go (TUG): 13 ?Berg Balance Scale: 36/56   ?  ?GAIT: ?Distance walked: 589ft ?Assistive device utilized: None ?Level of assistance: Complete Independence ?Comments: right trendelenburg, left leaning, no heel strike or knee extension right, shortened step length ?  ?  ?  ?  ?TODAY'S TREATMENT  ?Pt seen for aquatic therapy today.  Treatment took place in water 3.25-4.8 ft in depth at the [facility] pool. Temp of water was 92?.  Pt entered/exited the pool via stairs (step to pattern) independently with single rail. ? ?Warm up:  ?Multiple widths of walking forward, backward, and sidestepping without UE support ?Slow high knee marching in place,  dumbbells for support x15 ?UE shoulder add/abd, with rainbow dumbbells  ?Squats pushing yellow dumbbell under water x 12 ?Kickboard push pull x 20, with board submerged 50% ?Heel/toe raises x 10 ?Seated: ?Flutter kicking seated on 3rd step (bottom) 20 x 3 ?Add/abd 20 x 3 4th step ? ?Repeated backwards walking, cues to allow Rt knee  to flex during swing through ?Horiz abdct/add UE with red paddles x 20 ?R/L SLS x 20 sec x 2 each, yellow dumbbell in L hand ? ? ?Pt requires buoyancy for support and to offload joints with strengthening  exercises. Viscosity of the water is needed for resistance of strengthening; water current perturbations provides challenge to standing balance unsupported, requiring increased core activation. ? ?  ?  ?PATIENT EDUCATION:  ?Education details: Condition management; Properties of water; benefit of aquatic therapy ?Person educated: Patient ?Education method: Explanation ?Education comprehension: verbalized understanding ?  ?  ?HOME EXERCISE PROGRAM: ?Access Code: [email or web address] ?URL: [email or web address] ?Date: 12/15/2021 ?Prepared by: Geni BersFrankie Ziemba ?  ?Exercises ?Supine Lower Trunk Rotation - 1 x daily - 7 x weekly - 3 sets - 10 reps ?Supine Single Knee to Chest Stretch - 1 x daily - 7 x weekly - 3 sets - 10 reps ?  ?ASSESSMENT: ?  ?CLINICAL IMPRESSION: ?Pt continues with decreased balance with Rt SLS  and forward, unsupported gait.  She is able to complete some exercises without any UE support (floatation) with guidance from therapist on deck. She was able to complete more reps STS without UE support.  Informed pt she may have more LE muscle soreness tomorrow from increased reps.  She reported that her Rt ankle pain had decreased and her neck pain remained unchanged.  Minor cues required to engage lower traps while completing UE resistance in water. Pt is progressing gradually towards all goals.  ?  ?OBJECTIVE IMPAIRMENTS Abnormal gait, decreased activity tolerance, decreased balance, decreased coordination, decreased mobility, difficulty walking, decreased strength, and pain.  ?  ?ACTIVITY LIMITATIONS cleaning, community activity, yard work, and shopping.  ?  ?PERSONAL FACTORS Time since onset of injury/illness/exacerbation and 3+ comorbidities:    are also affecting patient's functional outcome.  ?  ?   ?REHAB POTENTIAL: Good ?  ?CLINICAL DECISION MAKING: Evolving/moderate complexity ?  ?EVALUATION COMPLEXITY: Moderate ?  ?  ?GOALS: ?Goals reviewed with patient? Yes ?  ?SHORT TERM GOALS: ?  ?STG Name Target Date

## 2022-01-20 ENCOUNTER — Ambulatory Visit (HOSPITAL_BASED_OUTPATIENT_CLINIC_OR_DEPARTMENT_OTHER): Payer: 59 | Admitting: Physical Therapy

## 2022-01-20 ENCOUNTER — Encounter (HOSPITAL_BASED_OUTPATIENT_CLINIC_OR_DEPARTMENT_OTHER): Payer: Self-pay | Admitting: Physical Therapy

## 2022-01-20 DIAGNOSIS — R262 Difficulty in walking, not elsewhere classified: Secondary | ICD-10-CM

## 2022-01-20 DIAGNOSIS — M545 Low back pain, unspecified: Secondary | ICD-10-CM | POA: Diagnosis not present

## 2022-01-20 DIAGNOSIS — R2689 Other abnormalities of gait and mobility: Secondary | ICD-10-CM

## 2022-01-20 DIAGNOSIS — M6281 Muscle weakness (generalized): Secondary | ICD-10-CM

## 2022-01-20 NOTE — Therapy (Addendum)
?OUTPATIENT PHYSICAL THERAPY TREATMENT NOTE ? ? ?Patient Name: Casey Diaz ?MRN: [id number] ?DOB:[birth year], 56 y.o., female ?Today's Date: 01/20/2022 ? ?PCP: Shon Baton, MD ?REFERRING PROVIDER: Shon Baton, MD ? ? PT End of Session - 01/20/22 1237   ? ? Visit Number 6   ? Number of Visits 18   ? Date for PT Re-Evaluation 03/16/22   ? Authorization - Visit Number 5   ? Progress Note Due on Visit 10   ? PT Start Time 1210   ? PT Stop Time 1250   ? PT Time Calculation (min) 40 min   ? Activity Tolerance Patient tolerated treatment well   ? Behavior During Therapy [facility] for tasks assessed/performed   ? ?  ?  ? ?  ? ? ? ? ? ?Past Medical History:  ?Diagnosis Date  ? Abnormal Pap smear 2011  ? hpv/mild dysplasia,cin1  ? Anxiety   ? Cerebral palsy ([place])   ? right arm/leg  ? Cystocele   ? Depression   ? Headache   ? Neuromuscular disorder ([place])   ? Cerebral Palsy  ? OCD (obsessive compulsive disorder)   ? Osteoporosis   ? Uterine prolaps   ? ?Past Surgical History:  ?Procedure Laterality Date  ? COLPOSCOPY  2011  ? ELBOW SURGERY    ? left elbow-separation of bones -age 89  ? ELBOW SURGERY  2015  ? fell on ice- right elbow fracture  ? FRACTURE SURGERY    ? HAMMER TOE SURGERY  1/13  ? RIGHT SIDE  ? HIP PINNING,CANNULATED Left 07/08/2016  ? Procedure: LEFT CLOSED REDUCTION HIP AND PERCUTANEOUS SCREW;  Surgeon: Paralee Cancel, MD;  Location: [facility];  Service: Orthopedics;  Laterality: Left;  ? ORIF HUMERUS FRACTURE Right 06/11/2021  ? Procedure: OPEN REDUCTION INTERNAL FIXATION (ORIF) DISTAL HUMERUS FRACTURE;  Surgeon: Altamese Muse, MD;  Location: [facility];  Service: Orthopedics;  Laterality: Right;  ? WRIST SURGERY  2005  ? left wrist  ? ?Patient Active Problem List  ? Diagnosis Date Noted  ? 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[place]) 08/03/2018  ? Relationship problem with family member 08/03/2018  ? Femoral neck fracture, left, closed, initial encounter 07/08/2016  ? Severe recurrent major  depression without psychotic features ([place]) 06/03/2015  ?  Class: Chronic  ? Obsessive-compulsive disorder 06/03/2015  ?  Class: Chronic  ? ? ?REFERRING DIAG: M54.51 (ICD-10-CM) - Vertebrogenic low back pain  ? ?THERAPY DIAG:  ?Chronic left-sided low back pain without sciatica ? ?Muscle weakness (generalized) ? ?Difficulty in walking, not elsewhere classified ? ?Other abnormalities of gait and mobility ? ?PERTINENT HISTORY: CP ?OPEN REDUCTION INTERNAL FIXATION (ORIF) DISTAL HUMERUS FRACTURE ?IM nail Left hip 2017 ?Osteoporosis ? ?PRECAUTIONS: fall ? ?SUBJECTIVE: "Pain about the same." ? ?PAIN:  ?Are you having pain? Yes ?NPRS scale:4/10 ?Pain location: neck/ ankle  ?Pain orientation: R (ankle), bilat (neck) ?PAIN TYPE: aching ?Pain description: constant  ?Aggravating factors: walking  ?Relieving factors: rest ?   ?PRECAUTIONS: Fall ?  ?WEIGHT BEARING RESTRICTIONS No ?  ?FALLS:  ?Has patient fallen in last 6 months? No, Number of falls: 1 in Aug 2022 ?  ?LIVING ENVIRONMENT: ?Lives with: lives with their family ?Lives in: House/apartment ?Stairs: Yes; Internal: 15 steps; none ?Has following equipment at home: None ?  ?  ?PLOF: Independent with household mobility without device ?  ?PATIENT GOALS to get stronger, more coordinated and better balanced. ?  ?  ?OBJECTIVE:  ?  ?  ?PATIENT  SURVEYS:  ?FOTO 13 with goal of 50 ?01/20/22: 55 ?  ?  ?COGNITION: ?         Overall cognitive status: Within functional limits for tasks assessed              ?          ?SENSATION: ?         intact ?  ?MUSCLE LENGTH: ?Hamstrings: Right 70 deg; Left 90 deg ?Thomas test: Right neg deg; Left neg deg ?  ?POSTURE:  ? left shoulder elevation, weightbearing shifted left ?  ?PALPATION: ?Tenderness bilat sub occipital and upper traps ?  ?LUMBARAROM/PROM ?  ?A/PROM A/PROM  ?12/15/2021  ?Flexion Forward reach 4 inches to floor  ?Extension    ?Right lateral flexion    ?Left lateral flexion    ?Right rotation    ?Left rotation    ? (Blank rows = not  tested) ?  ?LE AROM/PROM: ?  ?A/PROM Right ?12/15/2021 Left ?12/15/2021 Right ?01/20/22 Left ?01/20/22  ?Hip flexion 90 105    ?Hip extension        ?Hip abduction 25 30 28  35  ?Hip adduction        ?Hip internal rotation        ?Hip external rotation        ?Knee flexion 105 120    ?Knee extension        ?Ankle dorsiflexion 5      ?Ankle plantarflexion 10      ?Ankle inversion        ?Ankle eversion        ? (Blank rows = not tested) ?  ?LE MMT: ?  ?MMT Right ?12/15/2021 Left ?12/15/2021  ?Hip flexion 5 4  ?Hip extension 4 4  ?Hip abduction 5 5  ?Hip adduction 5 5  ?Hip internal rotation      ?Hip external rotation      ?Knee flexion 5 5  ?Knee extension      ?Ankle dorsiflexion      ?Ankle plantarflexion      ?Ankle inversion      ?Ankle eversion      ? (Blank rows = not tested) ?  ?LUMBAR SPECIAL TESTS:  ?Straight leg raise test: Negative, Slump test: Negative, and Thomas test: Negative ?  ?FUNCTIONAL TESTS:  ?5 times sit to stand: 20 ?Timed up and go (TUG): 13 ?Berg Balance Scale: 36/56   ?  ?GAIT: ?Distance walked: 551ft ?Assistive device utilized: None ?Level of assistance: Complete Independence ?Comments: right trendelenburg, left leaning, no heel strike or knee extension right, shortened step length ?  ?  ?  ?  ?TODAY'S TREATMENT  ?Pt seen for aquatic therapy today.  Treatment took place in water 3.25-4.8 ft in depth at the [facility] pool. Temp of water was 92?.  Pt entered/exited the pool via stairs (step to pattern) independently with single rail. ? ?Warm up:  ?Multiple widths of walking forward, backward, and sidestepping without UE support ?Standing ?Slow high knee marching in place,  dumbbells for support x15 ?UE shoulder add/abd, with rainbow dumbbells  ?Horiz abdct/add UE with 1 foam hand buoys x 20 ?R/L SLS x 20 sec x 2 each, 1 foam hand buoy ?Mini squats on bottom step 2x10 ?Heel/toe raises x 10 ? ?Seated: ?Flutter kicking seated on 3rd step (bottom) 20 x 3 ?Add/abd 20 x 3 4th step ?STS from 3rd step  (bottom) 2x10 unassisted with ue ? ? ? ?Pt requires buoyancy for support  and to offload joints with strengthening exercises. Viscosity of the water is needed for resistance of strengthening; water current perturbations provides challenge to standing balance unsupported, requiring increased core activation. ? ?  ?  ?PATIENT EDUCATION:  ?Education details: Condition management; Properties of water; benefit of aquatic therapy ?Person educated: Patient ?Education method: Explanation ?Education comprehension: verbalized understanding ?  ?  ?HOME EXERCISE PROGRAM: ?Access Code: [email or web address] ?URL: [email or web address] ?Date: 12/15/2021 ?Prepared by: Denton Meek ?  ?Exercises ?Supine Lower Trunk Rotation - 1 x daily - 7 x weekly - 3 sets - 10 reps ?Supine Single Knee to Chest Stretch - 1 x daily - 7 x weekly - 3 sets - 10 reps ?  ?ASSESSMENT: ?  ?CLINICAL IMPRESSION: ?Pt has made good progress with therapy although has not been see but 6 visits. Some visits missed due to scheduling conflicts. With testing today pt has improved and met her Foto LTG reaching 53% from 44. ROM of hips (as per chart above) improved partially meeting a STG nonetheless progressing in all movements. Her overall pain has decreased but she continues to be limited by daily with functional mobility and toleration to ADL's  She will continue to benefit from skilled physical therapy, the aquatic setting being optimal for enhancing her progression toward goals allowing freedom of movement through buoyancy to decrease pain and improve posture as well as decrease substitutions/compensations reducing gait deviations ?  ? ? ?OBJECTIVE IMPAIRMENTS Abnormal gait, decreased activity tolerance, decreased balance, decreased coordination, decreased mobility, difficulty walking, decreased strength, and pain.  ?  ?ACTIVITY LIMITATIONS cleaning, community activity, yard work, and shopping.  ?  ?PERSONAL FACTORS Time since onset of  injury/illness/exacerbation and 3+ comorbidities:    are also affecting patient's functional outcome.  ?  ?  ?REHAB POTENTIAL: Good ?  ?CLINICAL DECISION MAKING: Evolving/moderate complexity ?  ?EVALUATION COMPLEXITY: Moderate ?  ?

## 2022-01-21 ENCOUNTER — Ambulatory Visit (HOSPITAL_BASED_OUTPATIENT_CLINIC_OR_DEPARTMENT_OTHER): Payer: Self-pay | Admitting: Physical Therapy

## 2022-01-24 ENCOUNTER — Ambulatory Visit: Payer: 59

## 2022-01-24 ENCOUNTER — Ambulatory Visit (INDEPENDENT_AMBULATORY_CARE_PROVIDER_SITE_OTHER): Payer: 59 | Admitting: Psychiatry

## 2022-01-24 VITALS — BP 131/82 | HR 81

## 2022-01-24 DIAGNOSIS — F422 Mixed obsessional thoughts and acts: Secondary | ICD-10-CM | POA: Diagnosis not present

## 2022-01-24 DIAGNOSIS — Z636 Dependent relative needing care at home: Secondary | ICD-10-CM

## 2022-01-24 DIAGNOSIS — G809 Cerebral palsy, unspecified: Secondary | ICD-10-CM

## 2022-01-24 DIAGNOSIS — F339 Major depressive disorder, recurrent, unspecified: Secondary | ICD-10-CM

## 2022-01-24 DIAGNOSIS — F4010 Social phobia, unspecified: Secondary | ICD-10-CM | POA: Diagnosis not present

## 2022-01-24 DIAGNOSIS — F5105 Insomnia due to other mental disorder: Secondary | ICD-10-CM | POA: Diagnosis not present

## 2022-01-25 ENCOUNTER — Encounter (HOSPITAL_BASED_OUTPATIENT_CLINIC_OR_DEPARTMENT_OTHER): Payer: Self-pay | Admitting: Physical Therapy

## 2022-01-25 ENCOUNTER — Ambulatory Visit (HOSPITAL_BASED_OUTPATIENT_CLINIC_OR_DEPARTMENT_OTHER): Payer: 59 | Admitting: Physical Therapy

## 2022-01-25 DIAGNOSIS — M545 Low back pain, unspecified: Secondary | ICD-10-CM

## 2022-01-25 DIAGNOSIS — R2689 Other abnormalities of gait and mobility: Secondary | ICD-10-CM

## 2022-01-25 DIAGNOSIS — M6281 Muscle weakness (generalized): Secondary | ICD-10-CM

## 2022-01-25 DIAGNOSIS — R262 Difficulty in walking, not elsewhere classified: Secondary | ICD-10-CM

## 2022-01-25 NOTE — Therapy (Signed)
?OUTPATIENT PHYSICAL THERAPY TREATMENT NOTE ?Recert ? ?Patient Name: Casey Diaz ?MRN: [id number] ?DOB:[birth year], 56 y.o., female ?Today's Date: 01/25/2022 ? ?PCP: Shon Baton, MD ?REFERRING PROVIDER: Shon Baton, MD ? ? PT End of Session - 01/25/22 1155   ? ? Visit Number 7   ? Number of Visits 18   ? Date for PT Re-Evaluation 03/03/22   ? Authorization - Visit Number 7   ? Progress Note Due on Visit 10   ? PT Start Time 1145   ? PT Stop Time 1228   ? PT Time Calculation (min) 43 min   ? Activity Tolerance Patient tolerated treatment well   ? Behavior During Therapy [facility] for tasks assessed/performed   ? ?  ?  ? ?  ? ? ? ? ?Past Medical History:  ?Diagnosis Date  ? Abnormal Pap smear 2011  ? hpv/mild dysplasia,cin1  ? Anxiety   ? Cerebral palsy ([place])   ? right arm/leg  ? Cystocele   ? Depression   ? Headache   ? Neuromuscular disorder ([place])   ? Cerebral Palsy  ? OCD (obsessive compulsive disorder)   ? Osteoporosis   ? Uterine prolaps   ? ?Past Surgical History:  ?Procedure Laterality Date  ? COLPOSCOPY  2011  ? ELBOW SURGERY    ? left elbow-separation of bones -age 80  ? ELBOW SURGERY  2015  ? fell on ice- right elbow fracture  ? FRACTURE SURGERY    ? HAMMER TOE SURGERY  1/13  ? RIGHT SIDE  ? HIP PINNING,CANNULATED Left 07/08/2016  ? Procedure: LEFT CLOSED REDUCTION HIP AND PERCUTANEOUS SCREW;  Surgeon: Paralee Cancel, MD;  Location: [facility];  Service: Orthopedics;  Laterality: Left;  ? ORIF HUMERUS FRACTURE Right 06/11/2021  ? Procedure: OPEN REDUCTION INTERNAL FIXATION (ORIF) DISTAL HUMERUS FRACTURE;  Surgeon: Altamese Riverton, MD;  Location: [place];  Service: Orthopedics;  Laterality: Right;  ? WRIST SURGERY  2005  ? left wrist  ? ?Patient Active Problem List  ? Diagnosis Date Noted  ? 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 Relationship problem with family member 08/03/2018  ? Femoral neck fracture, left, closed, initial encounter 07/08/2016  ? Severe recurrent major  depression without psychotic features ([place]) 06/03/2015  ?  Class: Chronic  ? Obsessive-compulsive disorder 06/03/2015  ?  Class: Chronic  ? ? ?REFERRING DIAG: M54.51 (ICD-10-CM) - Vertebrogenic low back pain  ? ?THERAPY DIAG:  ?Chronic left-sided low back pain without sciatica ? ?Muscle weakness (generalized) ? ?Difficulty in walking, not elsewhere classified ? ?Other abnormalities of gait and mobility ? ?PERTINENT HISTORY: CP ?OPEN REDUCTION INTERNAL FIXATION (ORIF) DISTAL HUMERUS FRACTURE ?IM nail Left hip 2017 ?Osteoporosis ? ?PRECAUTIONS: fall ? ?SUBJECTIVE: Pt reports she feels less pain and can move better after aquatic PT sessions.   ? ?PAIN:  ?Are you having pain? Yes ?NPRS scale:4/10 ?Pain location: neck/ ankle/back  ?Pain orientation: R (ankle), bilat (neck/back) ?PAIN TYPE: aching ?Pain description: constant  ?Aggravating factors: walking  ?Relieving factors: rest ?   ?PRECAUTIONS: Fall ?  ?WEIGHT BEARING RESTRICTIONS No ?  ?FALLS:  ?Has patient fallen in last 6 months? No, Number of falls: 1 in Aug 2022 ?  ?LIVING ENVIRONMENT: ?Lives with: lives with their family ?Lives in: House/apartment ?Stairs: Yes; Internal: 15 steps; none ?Has following equipment at home: None ?  ?  ?PLOF: Independent with household mobility without device ?  ?PATIENT GOALS to get stronger, more coordinated and better balanced. ?  ?  ?  OBJECTIVE:  ?  ?  ?PATIENT SURVEYS:  ?FOTO 29 with goal of 50 ?01/20/22: 53 ?  ?  ?COGNITION: ?         Overall cognitive status: Within functional limits for tasks assessed              ?          ?SENSATION: ?         intact ?  ?MUSCLE LENGTH: ?Hamstrings: Right 70 deg; Left 90 deg ?Thomas test: Right neg deg; Left neg deg ?  ?POSTURE:  ? left shoulder elevation, weightbearing shifted left ?  ?PALPATION: ?Tenderness bilat sub occipital and upper traps ?  ?LUMBARAROM/PROM ?  ?A/PROM A/PROM  ?12/15/2021  ?Flexion Forward reach 4 inches to floor  ?Extension    ?Right lateral flexion    ?Left lateral  flexion    ?Right rotation    ?Left rotation    ? (Blank rows = not tested) ?  ?LE AROM/PROM: ?  ?A/PROM Right ?12/15/2021 Left ?12/15/2021 Right ?01/20/22 Left ?01/20/22  ?Hip flexion 90 105    ?Hip extension        ?Hip abduction 25 30 28  35  ?Hip adduction        ?Hip internal rotation        ?Hip external rotation        ?Knee flexion 105 120    ?Knee extension        ?Ankle dorsiflexion 5      ?Ankle plantarflexion 10      ?Ankle inversion        ?Ankle eversion        ? (Blank rows = not tested) ?  ?LE MMT: ?  ?MMT Right ?12/15/2021 Left ?12/15/2021  ?Hip flexion 5 4  ?Hip extension 4 4  ?Hip abduction 5 5  ?Hip adduction 5 5  ?Hip internal rotation      ?Hip external rotation      ?Knee flexion 5 5  ?Knee extension      ?Ankle dorsiflexion      ?Ankle plantarflexion      ?Ankle inversion      ?Ankle eversion      ? (Blank rows = not tested) ?  ?LUMBAR SPECIAL TESTS:  ?Straight leg raise test: Negative, Slump test: Negative, and Thomas test: Negative ?  ?FUNCTIONAL TESTS:  ?5 times sit to stand: 20 ?Timed up and go (TUG): 13 ?Berg Balance Scale: 36/56   ?  ?GAIT: ?Distance walked: 545ft ?Assistive device utilized: None ?Level of assistance: Complete Independence ?Comments: right trendelenburg, left leaning, no heel strike or knee extension right, shortened step length ?  ?  ?  ?  ?TODAY'S TREATMENT  ?Pt seen for aquatic therapy today.  Treatment took place in water 3.25-4.8 ft in depth at the [facility] pool. Temp of water was 92?.  Pt entered/exited the pool via stairs (step to pattern) independently with single rail. ? ?Warm up:  ?Multiple widths of walking forward, backward, and sidestepping without UE support ?Standing: ?UE shoulder add/abd, with rainbow buoys x 10 ?Slow high knee marching in place,  buoys for support x15 ?R/L SLS x 20 sec x 2 each, 1 foam hand buoy ?Tandem gait forward / backward x 2 laps ?Heel/toe raises x 15 ?3 way leg kicks with UE supported by yellow buoys ?Walking forward lunges with  UE supported by rainbow buoys ?Seated (on 4th step from bottom) ?Flutter kicking 20 x 3 ?Add/abd 20 x 3 ?STS  x 15 unassisted with UE ? ?Pt requires buoyancy for support and to offload joints with strengthening exercises. Viscosity of the water is needed for resistance of strengthening; water current perturbations provides challenge to standing balance unsupported, requiring increased core activation. ? ?  ?  ?PATIENT EDUCATION:  ?Education details: Condition management; Properties of water; benefit of aquatic therapy ?Person educated: Patient ?Education method: Explanation ?Education comprehension: verbalized understanding ?  ?  ?HOME EXERCISE PROGRAM: ?Access Code: [email or web address] ?URL: [email or web address] ?Date: 12/15/2021 ?Prepared by: Denton Meek ?  ?Exercises ?Supine Lower Trunk Rotation - 1 x daily - 7 x weekly - 3 sets - 10 reps ?Supine Single Knee to Chest Stretch - 1 x daily - 7 x weekly - 3 sets - 10 reps ?  ?ASSESSMENT: ?  ?CLINICAL IMPRESSION: ?Pt reported LE/ back loosening up with exercises performed in 4+ft of water.  She was challenged with the forward walking lunges.  No increase in pain during session.  Pt will continue to benefit from skilled physical therapy, the aquatic setting being optimal for enhancing her progression toward goals allowing freedom of movement through buoyancy to decrease pain and improve posture as well as decrease substitutions/compensations reducing gait deviations. ?  ? ? ?OBJECTIVE IMPAIRMENTS Abnormal gait, decreased activity tolerance, decreased balance, decreased coordination, decreased mobility, difficulty walking, decreased strength, and pain.  ?  ?ACTIVITY LIMITATIONS cleaning, community activity, yard work, and shopping.  ?  ?PERSONAL FACTORS Time since onset of injury/illness/exacerbation and 3+ comorbidities:    are also affecting patient's functional outcome.  ?  ?  ?REHAB POTENTIAL: Good ?  ?CLINICAL DECISION MAKING: Evolving/moderate complexity ?   ?EVALUATION COMPLEXITY: Moderate ?  ?  ?GOALS: ?Goals reviewed with patient? Yes ?  ?SHORT TERM GOALS: ?  ?STG Name Target Date Goal status  ?1 Pt will tolerate entire sessions of aquatic therapy without in

## 2022-01-25 NOTE — Progress Notes (Signed)
NURSE Visit: ?  ?Pt arrived for her weekly Spravato Treatment, she received  84 mg (3 of the 28 mg) Spravato nasal spray. 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 She was not able to come for a treatment last week because she was sick, her last treatment was 01/10/2022. Her B/P at 3:20 PM 117/75, 88, Pt instructed to blow her nose and to recline back at 45 degrees. Pt given first nasal spray (28 mg) administered by pt observed by nurse. There were 5 minutes between each dose, total of 84 mg. Tolerated well. Pt's medication is delivered by [facility] in [place] and stored inside a safe behind a locked door as well. Spravato is a CIII medication and has to be only given at a treatment facility and never given to patient. ? Pt was very emotional today and crying about several situations with her family and upset about not going to church on Easter Sunday. I discussed and listened to pt for most of her treatment. She had a lot to get out today. She has missed 2 treatments in the past month so I am not sure if that is what is affecting her depression and emotional state. Pt's 40 minute vital signs at 4:15 PM 148/88, 84. [name] comes to discuss her care at the end of her treatment when her thoughts are clearer, she was also very tearful and crying quite a bit when he was discussing her treatment. Discharge vitals at 5:10 PM 131/82, 81. Pt stable for discharge. Pt's next visit is Monday, April 17th.  ?Pt was observed on site a total of 120 minutes per FDA/REMS requirements. Pt was with nurse for clinical assessment a total of 55 minutes.  ?  ?LOT [id number] ?EXP 2024 JUL ?

## 2022-01-27 ENCOUNTER — Ambulatory Visit (HOSPITAL_BASED_OUTPATIENT_CLINIC_OR_DEPARTMENT_OTHER): Payer: 59 | Admitting: Physical Therapy

## 2022-01-27 DIAGNOSIS — G8929 Other chronic pain: Secondary | ICD-10-CM

## 2022-01-27 DIAGNOSIS — M6281 Muscle weakness (generalized): Secondary | ICD-10-CM

## 2022-01-27 DIAGNOSIS — M545 Low back pain, unspecified: Secondary | ICD-10-CM | POA: Diagnosis not present

## 2022-01-27 NOTE — Therapy (Signed)
?OUTPATIENT PHYSICAL THERAPY TREATMENT NOTE ?Recert ? ?Patient Name: Casey Diaz ?MRN: [id number] ?DOB:[birth year], 56 y.o., female ?Today's Date: 01/27/2022 ? ?PCP: Creola Corn, MD ?REFERRING PROVIDER: Creola Corn, MD ? ? PT End of Session - 01/27/22 1153   ? ? Visit Number 8   ? Number of Visits 18   ? Date for PT Re-Evaluation 03/03/22   ? PT Start Time 1147   ? PT Stop Time 1230   ? PT Time Calculation (min) 43 min   ? Activity Tolerance Patient tolerated treatment well   ? Behavior During Therapy [facility] for tasks assessed/performed   ? ?  ?  ? ?  ? ? ? ? ?Past Medical History:  ?Diagnosis Date  ? Abnormal Pap smear 2011  ? hpv/mild dysplasia,cin1  ? Anxiety   ? Cerebral palsy ([facility])   ? right arm/leg  ? Cystocele   ? Depression   ? Headache   ? Neuromuscular disorder ([facility])   ? Cerebral Palsy  ? OCD (obsessive compulsive disorder)   ? Osteoporosis   ? Uterine prolaps   ? ?Past Surgical History:  ?Procedure Laterality Date  ? COLPOSCOPY  2011  ? ELBOW SURGERY    ? left elbow-separation of bones -age 33  ? ELBOW SURGERY  2015  ? fell on ice- right elbow fracture  ? FRACTURE SURGERY    ? HAMMER TOE SURGERY  1/13  ? RIGHT SIDE  ? HIP PINNING,CANNULATED Left 07/08/2016  ? Procedure: LEFT CLOSED REDUCTION HIP AND PERCUTANEOUS SCREW;  Surgeon: Durene Romans, MD;  [facility];  Service: Orthopedics;  Laterality: Left;  ? ORIF HUMERUS FRACTURE Right 06/11/2021  ? Procedure: OPEN REDUCTION INTERNAL FIXATION (ORIF) DISTAL HUMERUS FRACTURE;  Surgeon: Myrene Galas, MD;  Location: [facility];  Service: Orthopedics;  Laterality: Right;  ? WRIST SURGERY  2005  ? left wrist  ? ?Patient Active Problem List  ? Diagnosis Date Noted  ? 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[facility]) 08/03/2018  ? Relationship problem with family member 08/03/2018  ? Femoral neck fracture, left, closed, initial encounter 07/08/2016  ? Severe recurrent major depression without psychotic features ([facility]) 06/03/2015  ?  Class:  Chronic  ? Obsessive-compulsive disorder 06/03/2015  ?  Class: Chronic  ? ? ?REFERRING DIAG: M54.51 (ICD-10-CM) - Vertebrogenic low back pain  ? ?THERAPY DIAG:  ?Chronic left-sided low back pain without sciatica ? ?Muscle weakness (generalized) ? ?PERTINENT HISTORY: CP ?OPEN REDUCTION INTERNAL FIXATION (ORIF) DISTAL HUMERUS FRACTURE ?IM nail Left hip 2017 ?Osteoporosis ? ?PRECAUTIONS: fall ? ?SUBJECTIVE: "My (Rt) ankle feels very stiff today".  She plans to have a follow up with Dr for her ankle soon.   She reports that her Rt ankle hasn't been the same since she fell down the stairs in Sept 2022. ? ?PAIN:  ?Are you having pain? Yes ?NPRS scale:4/10;  8/10  ?Pain location: neck/ back; Rt ankle  ?Pain orientation: R (ankle), bilat (neck/back) ?PAIN TYPE: aching ?Pain description: constant  ?Aggravating factors: walking  ?Relieving factors: rest ?   ?PRECAUTIONS: Fall ?  ?WEIGHT BEARING RESTRICTIONS No ?  ?FALLS:  ?Has patient fallen in last 6 months? No, Number of falls: 1 in Aug 2022 ?  ?LIVING ENVIRONMENT: ?Lives with: lives with their family ?Lives in: House/apartment ?Stairs: Yes; Internal: 15 steps; none ?Has following equipment at home: None ?  ?  ?PLOF: Independent with household mobility without device ?  ?PATIENT GOALS to get stronger, more coordinated and better balanced. ?  ?  ?  OBJECTIVE:  ?  ?  ?PATIENT SURVEYS:  ?FOTO [id number] with goal of 50 ?01/20/22: 53 ?  ?  ?COGNITION: ?         Overall cognitive status: Within functional limits for tasks assessed              ?          ?SENSATION: ?         intact ?  ?MUSCLE LENGTH: ?Hamstrings: Right 70 deg; Left 90 deg ?Thomas test: Right neg deg; Left neg deg ?  ?POSTURE:  ? left shoulder elevation, weightbearing shifted left ?  ?PALPATION: ?Tenderness bilat sub occipital and upper traps ?  ?LUMBARAROM/PROM ?  ?A/PROM A/PROM  ?12/15/2021  ?Flexion Forward reach 4 inches to floor  ?Extension    ?Right lateral flexion    ?Left lateral flexion    ?Right rotation    ?Left  rotation    ? (Blank rows = not tested) ?  ?LE AROM/PROM: ?  ?A/PROM Right ?12/15/2021 Left ?12/15/2021 Right ?01/20/22 Left ?01/20/22  ?Hip flexion 90 105    ?Hip extension        ?Hip abduction 25 30 28  35  ?Hip adduction        ?Hip internal rotation        ?Hip external rotation        ?Knee flexion 105 120    ?Knee extension        ?Ankle dorsiflexion 5      ?Ankle plantarflexion 10      ?Ankle inversion        ?Ankle eversion        ? (Blank rows = not tested) ?  ?LE MMT: ?  ?MMT Right ?12/15/2021 Left ?12/15/2021  ?Hip flexion 5 4  ?Hip extension 4 4  ?Hip abduction 5 5  ?Hip adduction 5 5  ?Hip internal rotation      ?Hip external rotation      ?Knee flexion 5 5  ?Knee extension      ?Ankle dorsiflexion      ?Ankle plantarflexion      ?Ankle inversion      ?Ankle eversion      ? (Blank rows = not tested) ?  ?LUMBAR SPECIAL TESTS:  ?Straight leg raise test: Negative, Slump test: Negative, and Thomas test: Negative ?  ?FUNCTIONAL TESTS:  ?5 times sit to stand: 20 ?Timed up and go (TUG): 13 ?Berg Balance Scale: 36/56   ?  ?GAIT: ?Distance walked: 53200ft ?Assistive device utilized: None ?Level of assistance: Complete Independence ?Comments: right trendelenburg, left leaning, no heel strike or knee extension right, shortened step length ?  ?  ?  ?  ?TODAY'S TREATMENT  ?Pt seen for aquatic therapy today.  Treatment took place in water 3.25-4.8 ft in depth at the [facility] pool. Temp of water was 92?.  Pt entered/exited the pool via stairs (step to pattern) independently with single rail. ? ?Warm up:  ?Multiple widths of walking forward, backward, and sidestepping without UE support ?Standing: ?Single leg ankle circles x 10 each ? Heel / toe raises x 20  ?Calf (runners) stretch x 20 sec x 2 reps each ?R/L SLS x 20 sec x 3 each, 1 foam hand buoy for Rt stance; able to complete without UE on LLE.  ?Tandem gait forward / backward x 2 laps ? 3 way leg kicks with UE supported by yellow buoys x 10 each leg ?UE shoulder  add/abd, with rainbow buoys x  12; shoulder flex to neutral  x 5 each side ?Forward lunge and return to neutral with single UE on wall x 6 each side (difficult) ?Sit to/from stand on bench in water with step under feet no UE x 10 (quick); repeated on 4th step (slow) x 6 ? ?Pt requires buoyancy for support and to offload joints with strengthening exercises. Viscosity of the water is needed for resistance of strengthening; water current perturbations provides challenge to standing balance unsupported, requiring increased core activation. ? ?  ?  ?PATIENT EDUCATION:  ?Education details: Condition management; Properties of water; benefit of aquatic therapy ?Person educated: Patient ?Education method: Explanation ?Education comprehension: verbalized understanding ?  ?  ?HOME EXERCISE PROGRAM: ?Access Code: [email or web address] ?URL: [email or web address] ?Date: 12/15/2021 ?Prepared by: Geni Bers ?  ?Exercises ?Supine Lower Trunk Rotation - 1 x daily - 7 x weekly - 3 sets - 10 reps ?Supine Single Knee to Chest Stretch - 1 x daily - 7 x weekly - 3 sets - 10 reps ?  ?ASSESSMENT: ?  ?CLINICAL IMPRESSION: ?Pt observed with head laterally flexed to Rt while she talks (complains of inc Lt neck pain); pt able to hold head in more neutral position during session once made aware. She was challenged with the forward lunges with RLE and has more weight shift to Lt during sit to/from stand.  Decrease in Rt ankle pain to 4/10 during session.  Pt will continue to benefit from skilled physical therapy, the aquatic setting being optimal for enhancing her progression toward goals allowing freedom of movement through buoyancy to decrease pain and improve posture as well as decrease substitutions/compensations reducing gait deviations. ?  ?OBJECTIVE IMPAIRMENTS Abnormal gait, decreased activity tolerance, decreased balance, decreased coordination, decreased mobility, difficulty walking, decreased strength, and pain.  ?  ?ACTIVITY  LIMITATIONS cleaning, community activity, yard work, and shopping.  ?  ?PERSONAL FACTORS Time since onset of injury/illness/exacerbation and 3+ comorbidities:    are also affecting patient's functional outcome.

## 2022-01-28 ENCOUNTER — Telehealth: Payer: Self-pay

## 2022-01-28 ENCOUNTER — Ambulatory Visit (HOSPITAL_BASED_OUTPATIENT_CLINIC_OR_DEPARTMENT_OTHER): Payer: Self-pay | Admitting: Physical Therapy

## 2022-01-31 ENCOUNTER — Ambulatory Visit: Payer: 59

## 2022-01-31 ENCOUNTER — Encounter: Payer: Self-pay | Admitting: Psychiatry

## 2022-01-31 ENCOUNTER — Ambulatory Visit (INDEPENDENT_AMBULATORY_CARE_PROVIDER_SITE_OTHER): Payer: 59 | Admitting: Adult Health

## 2022-01-31 VITALS — BP 126/82 | HR 81

## 2022-01-31 DIAGNOSIS — F339 Major depressive disorder, recurrent, unspecified: Secondary | ICD-10-CM

## 2022-01-31 NOTE — Progress Notes (Signed)
Casey Diaz ?[id number] ?[birth year] ?56 y.o. ? ? ? ?Subjective:  ? ?Patient ID:  Casey Diaz is a 56 y.o. (DOB 1966/02/13) female. ? ?Chief Complaint:  ?Chief Complaint  ?Patient presents with  ? Follow-up  ? Depression  ? Anxiety  ? ? ? ?HPI ?Casey Diaz presents to the office today for follow-up of OCD and severe anxiety.   ?  ?December 2019 visit the following was noted: No meds were changed. ?Lives in [place] and back for followup.  Sx are about the same.  Has to take meds with different sizes. ?Pt reports that mood is Anxious and Depressed and describes anxiety as Severe. Anxiety symptoms include: Excessive Worry, ?Obsessive Compulsive Symptoms:   Checking,,. Pt reports has interrupted sleep and nocturia. Pt reports that appetite is good. Pt reports that energy is no change and down slightly. Concentration is down slightly. Suicidal thoughts:  denied by patient. ?Loves the environment of [place] but misses some things there.  She's not able to work there.  H works there and likes it.  Struggled with not working, feels isolated and not up to task of meeting people.  Does attend a church and met a friend who's been helpful.  Leaving for [place] on 10/16/18. ?  ?04/09/2020 appointment the following is noted: ? Staying another year in [place] bc Covid and other things. ?Last few months a lot of crying spells.  Is in menopause. ?Wonders about med changes though is nervous about it.  Crying spells associated with depressing thoughts more than stress or OCD.   ?Covid really hard on everyone and couldn't see family for 18 mos.  Family still very dysfunctional. ?No close friends in part due to OCD and depression. ?Son high Autism spectrum with ADHD and anxiety and she's with him all the time. ?Greater health problems with CP so more pains.  ? ?05/15/20 appt with the following noted: ?Started Estroven for menopause and helps some. ?Still depressed.  Chronically. ?In [place] for 2 more weeks then to [place] for another  year. ?A lot of stressors lately triggering more checking and anxiety.   ?OCD is her CC now and seems.  Got worse DT stress.   ?Stressed with Asberger's son and her health.  H works a lot.  Her FOO still stress. ?Plan: Trintellix 10 mg 1 tablet in the morning with food and reduce fluvoxamine to 5 tablets nightly for 1 week  ?then reduce it to 4 tablets nightly.  ? ?07/02/20 appt with the following noted: ?Decided not to get Trintellix bc difficulty getting it. ?It is available.  There.  Wants to start it now.   ?Both depression and OCD are severe.  Not suicidal in intent or plan. ?Did not take samples with her to [place] but will be back in December. ?covid is worse there and travel is difficult.  ?Wants to reduce Wellbutrin DT dry mouth. ?Plan: She's afraid to reduce Luvox at this time DT fear of worsening OCD.  But will consider. ?Trintellix 10 mg 1 tablet in the morning with food and reduce fluvoxamine to 5 tablets nightly for 1 week  ?then reduce it to 4 tablets nightly. ?Also reduce Wellbutrin XL to 300 mg daily.  ?  ?9-13 2022 appointment with the following noted: ?Back in [place] since July 14.  Broke arm a month ago and surgery.  It's all been rough adjustment.   ?B has cancer on his face and M fell taking him to the doctor.  Misses the water  and weather of [place].   ?Cry a lot more since menopause. Still depression and anxiety and OCD.  Asks about ketamine. ?On Wellbutrin 300, Luvox 300.  No Trintellix. ?Added Ativan 2 mg AM and HS and it helps.  More likely to get upset at night. ?Plan: Increase Luvox back to 400 mg daily.  She thinks she's worse on less. ?Continue Wellbutrin XL to 300 mg daily. ?Plan to start Spravato for TRD asap  ? ?09/27/2021 appointment with the following noted:  ?She has started Spravato today at 54 mg intranasally.  She tolerated it well without unusual nausea or vomiting headache or other somatic symptoms.  She did have the expected dissociation which gradually resolved over the course  of the 2-hour period of observation.  She was a little concerned about her balance given her cerebral palsy but has not noted unusual or unexpected problems.  She is motivated to can continue Spravato in hopes of reducing her depressive symptoms. 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  She also has treatment resistant OCD which is partially managed with medications but is still quite disabling.  She is tolerating the medications well.  She is sleeping adequately.  Her appetite is adequate.  She is not having suicidal thoughts.  She continues to wish for a better treatment for OCD that would give her some relief. ? ?09/30/2021 appointment with the following noted: ?She received her first dose of Spravato 84 mg intranasally today.  She tolerated it well without unusual nausea, vomiting, or other somatic symptoms.  Dissociation as expected did occur and gradually resolved over the 2-hour period of observation.  She did have a mild headache today with the treatment and received ibuprofen 600 mg at her request.  We will follow this to see if it is a pattern ?Patient is still depressed.  She said she was late with her medicine today and today was a particularly depressing day.  However she notes that the Spravato has lifted her mood considerably even today.  She is hopeful that it will continue to be helpful.  No suicidal thoughts.  She has ongoing chronic anxiety and OCD at baseline. ? ?10/04/21 appt noted: ?Patient received Spravato 84 mg for the second time today.  She tolerated it well without any unusual headache, nausea or vomiting or other somatic symptoms.  Dissociation did occur and she gradually [place] resolution over the 2-hour period of observation. ?She did not have any unusual problems after she left the office last Spravato administration.  She did not have any specific problems with balance or walking.  She is at increased risk of that difficulty because of cerebral  palsy.  So far she has not noticed much mood effect from the medication beyond the first day of receiving it.  However she would like to continue Spravato in hopes of getting the antidepressant effect that is desired. ?Stress dealing with mother's behavior at party pt hosted.  Guilt over it. ? ?10/07/2021 appointment noted: ?Patient received Spravato 84 mg for the second time today.  She tolerated it well without any unusual headache, nausea or vomiting or other somatic symptoms.  Dissociation did occur and she gradually [place] resolution over the 2-hour period of observation. ?She still is not sure about the antidepressant effect of Spravato.  Events over the holidays and demands, make it difficult to assess.  She still notes that the OCD tends to worsen the depression and vice versa.  She tolerates the Spravato well and  wants to continue the trial. ? ?10/15/2021 appointment with the following noted: ?Patient received Spravato 84 mg for the second time today.  She tolerated it well without any unusual headache, nausea or vomiting or other somatic symptoms.  Dissociation did occur and she gradually [place] resolution over the 2-hour period of observation. ?Patient says it was somewhat difficult to evaluate the effect of the Spravato.  It was scheduled to be twice weekly for 4 weeks consecutively but the holidays have interfered with that administration.  She asked what specifically should be she should be looking for in order to assess improvement.  That was discussed.  The OCD is unchanged and the depression so far is not significantly different.  She still tolerates meds.  There have been no recent med changes ? ?10/19/2021 appt noted: ?Patient received Spravato 84 mg for the second today.  She tolerated it well without any unusual headache, nausea or vomiting or other somatic symptoms.  Dissociation did occur and she gradually saw resolution over the 2-hour period of observation.  ? ?10/21/2021 appointment noted: ?Patient  received Spravato 84 mg today.  She tolerated it well without any unusual headache, nausea or vomiting or other somatic symptoms.  Dissociation did occur and she gradually saw resolution over the 2-hour per

## 2022-01-31 NOTE — Progress Notes (Signed)
NURSE Visit: ?  ?Pt arrived for her weekly Spravato Treatment, she is going on her 23rd dose, she continues with 84 mg (3 of the 28 mg) Spravato nasal spray. 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 Her B/P at 3:20 PM 124/72, 90, Pt instructed to blow her nose and to recline back at 45 degrees. Pt given first nasal spray (28 mg) administered by pt observed by nurse. There were 5 minutes between each dose, total of 84 mg. Tolerated well. Pt's medication is delivered by [facility] in [place] and stored inside a safe behind a locked door as well. Spravato is a CIII medication and has to be only given at a treatment facility and observed by nurse as pt administered intranasally.  Pt's 40 minute vital signs at 4:00 PM 108/72, 85. Yvette Rack, NP did her visit today instead of [name], to discuss her care at the end of her treatment when her thoughts are clearer. She voiced no complaints, no headaches, nausea, vomiting, mood better today. She does go to the bathroom at least once during her treatment. No sedation and had slight feeling of being "high" she reports.  Discharge vitals at 5:10 PM 126/82, 81. Pt stable for discharge, pt reports she will call me tomorrow and let me know what day she will schedule for next week due to not having a ride on Monday.  ?Pt was observed on site a total of 120 minutes per FDA/REMS requirements. Pt was with nurse for clinical assessment a total of 55 minutes.  ?  ?LOT [id number] ?EXP 2024 JUL ?

## 2022-01-31 NOTE — Progress Notes (Signed)
Casey Diaz ?[id number] ?October 28, 1965 ?56 y.o. ? ?Subjective:  ? ?Patient ID:  Casey Diaz is a 56 y.o. (DOB [birth year]) female. ? ?Chief Complaint: No chief complaint on file. ? ? ?HPI ?Casey Diaz presents to the office today for follow-up of Treatment Resistant Depression. ? ?Treatment 23 ? ?Patient was administered Spravato 84 mg intranasally today. She was observed by provider throughout [facility] treatment. The patient experienced the typical dissociation which gradually resolved over the 2-hour period of observation. There were no complications. Specifically, the patient did not have any untoward side effects - feeling disconnected from herself, her thoughts, feelings and things around her, dizziness, nausea, feeling sleepy, decreased feeling of sensitivity (numbness)spinning sensation, feeling anxious, lack of energy, increased blood pressure, nausea, vomiting, feeling drink, or feeling happy or very excited.nausea or vomiting or headache. Blood pressures remained within normal ranges at the 40-minute and 2-hour follow-up intervals. By the time the 2-hour observation period was met the patient was alert and oriented and able to exit without assistance. 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 See nursing note for further details.   ? ? ?ECT-MADRS   ? ?[place] Office Visit from 06/29/2021 in [facility]  ?MADRS Total Score 36  ? ?  ? ?Flowsheet Row Admission (Discharged) from 06/11/2021 in [place]  ?C-SSRS RISK CATEGORY No Risk  ? ?  ?  ? ?Review of Systems:  ?Review of Systems  ?Musculoskeletal:  Negative for gait problem.  ?Neurological:  Negative for tremors.  ?Psychiatric/Behavioral:    ?     Please refer to HPI  ? ?Medications: I have reviewed the patient's current medications. ? ?Current Outpatient Medications  ?Medication Sig Dispense Refill  ? Abaloparatide (TYMLOS)  3120 MCG/1.56ML SOPN Inject into the skin.    ? Azelastine-Fluticasone 137-50 MCG/ACT SUSP Place 1-2 sprays into both nostrils daily.    ? baclofen (LIORESAL) 10 MG tablet Take 20 mg by mouth at bedtime as needed for muscle spasms.    ? buPROPion (WELLBUTRIN XL) 150 MG 24 hr tablet Take 3 tablets (450 mg total) by mouth daily. 270 tablet 0  ? buPROPion (WELLBUTRIN XL) 300 MG 24 hr tablet TAKE 1 TABLET BY MOUTH EVERY DAY 90 tablet 0  ? dicyclomine (BENTYL) 10 MG capsule Take 10 mg by mouth daily.    ? docusate sodium (COLACE) 100 MG capsule Take 1 capsule (100 mg total) by mouth 2 (two) times daily. (Patient taking differently: Take 100 mg by mouth daily.) 10 capsule 0  ? Esketamine HCl, 84 MG Dose, (SPRAVATO, 84 MG DOSE,) 28 MG/DEVICE SOPK USE NASALLY AS DIRECTED ON PAKCAGE LABEL TWICE A WEEK 3 each 0  ? fexofenadine (ALLEGRA) 180 MG tablet Take 180 mg by mouth daily.    ? fluvoxaMINE (LUVOX) 100 MG tablet Take 3 tablets (300 mg total) by mouth at bedtime. 270 tablet 0  ? fluvoxaMINE (LUVOX) 100 MG tablet Take 1 tablet (100 mg total) by mouth at bedtime. 90 tablet 0  ? hydrocortisone (ANUSOL-HC) 2.5 % rectal cream Place rectally 2 (two) times daily. x 7-14 days 30 g 0  ? ketotifen (ZADITOR) 0.025 % ophthalmic solution Place 3 drops into both eyes at bedtime.    ? LORazepam (ATIVAN) 1 MG tablet TAKE 2 TABLETS (2 MG TOTAL) BY MOUTH 2 (TWO) TIMES DAILY. 90 tablet 3  ? magnesium gluconate (MAGONATE) 500 MG tablet Take 500 mg by mouth  daily.    ? MIBELAS 24 FE 1-20 MG-MCG(24) CHEW Chew 1 tablet by mouth at bedtime as needed (bowel regularity).    ? Multiple Vitamins-Minerals (ADULT GUMMY PO) Take 2 tablets by mouth in the morning.    ? nitrofurantoin (MACRODANTIN) 100 MG capsule Take 100 mg by mouth as needed (For urinary tract infection.).     ? oxyCODONE-acetaminophen (PERCOCET/ROXICET) 5-325 MG tablet Take 1-2 tablets by mouth every 6 (six) hours as needed for severe pain. 50 tablet 0  ? polyethylene glycol (MIRALAX /  GLYCOLAX) packet Take 17 g by mouth daily as needed for mild constipation. 14 each 0  ? psyllium (METAMUCIL) 58.6 % powder Take 1 packet by mouth daily as needed (constipation).    ? temazepam (RESTORIL) 30 MG capsule Take 1 capsule (30 mg total) by mouth at bedtime as needed for sleep. 30 capsule 5  ? Vitamin D-Vitamin K (VITAMIN K2-VITAMIN D3 PO) Take 1-2 sprays by mouth daily.    ? ?No current facility-administered medications for this visit.  ? ? ?Medication Side Effects: None ? ?Allergies:  ?Allergies  ?Allergen Reactions  ? Hydrocodone Itching  ? Sulfamethoxazole-Trimethoprim Itching  ? Dust Mite Extract Other (See Comments)  ?  Sneezing, watery eyes, runny nose  ? Latex Itching  ? Other Other (See Comments)  ?  PT IS ALLERGIC TO CAT DANDER AND RAGWEED - Sneezing, watery eyes, runny nose ?  ? Pollen Extract Other (See Comments)  ?  Sneezing, watery eyes, runny nose ?  ? ? ?Past Medical History:  ?Diagnosis Date  ? Abnormal Pap smear 2011  ? hpv/mild dysplasia,cin1  ? Anxiety   ? Cerebral palsy ([place])   ? right arm/leg  ? Cystocele   ? Depression   ? Headache   ? Neuromuscular disorder ([place])   ? Cerebral Palsy  ? OCD (obsessive compulsive disorder)   ? Osteoporosis   ? Uterine prolaps   ? ? ?Past Medical History, Surgical history, Social history, and Family history were reviewed and updated as appropriate.  ? ?Please see review of systems for further details on the patient's review from today.  ? ?Objective:  ? ?Physical Exam:  ?LMP  (LMP Unknown)  ? ?Physical Exam ?Constitutional:   ?   General: She is not in acute distress. ?Musculoskeletal:     ?   General: No deformity.  ?Neurological:  ?   Mental Status: She is alert and oriented to person, place, and time.  ?   Coordination: Coordination normal.  ?Psychiatric:     ?   Attention and Perception: Attention and perception normal. She does not perceive auditory or visual hallucinations.     ?   Mood and Affect: Mood normal. Mood is not anxious or depressed.  Affect is not labile, blunt, angry or inappropriate.     ?   Speech: Speech normal.     ?   Behavior: Behavior normal.     ?   Thought Content: Thought content normal. Thought content is not paranoid or delusional. Thought content does not include homicidal or suicidal ideation. Thought content does not include homicidal or suicidal plan.     ?   Cognition and Memory: Cognition and memory normal.     ?   Judgment: Judgment normal.  ?   Comments: Insight intact  ? ? ?Lab Review:  ?   ?Component Value Date/Time  ? NA 138 06/11/2021 0606  ? K 4.0 06/11/2021 0606  ? CL 107 06/11/2021 0606  ?  CO2 26 06/11/2021 0606  ? GLUCOSE 90 06/11/2021 0606  ? BUN 18 06/11/2021 0606  ? CREATININE 0.81 06/11/2021 0606  ? CALCIUM 9.4 06/11/2021 0606  ? PROT 6.5 06/11/2021 0606  ? ALBUMIN 3.3 (L) 06/11/2021 0606  ? AST 17 06/11/2021 0606  ? ALT 14 06/11/2021 0606  ? ALKPHOS 141 (H) 06/11/2021 0606  ? BILITOT 0.2 (L) 06/11/2021 0606  ? GFRNONAA >60 06/11/2021 0606  ? GFRAA >60 07/09/2016 0438  ? ? ?   ?Component Value Date/Time  ? WBC 5.8 06/11/2021 0606  ? RBC 4.12 06/11/2021 0606  ? HGB 12.5 06/11/2021 0606  ? HCT 39.7 06/11/2021 0606  ? PLT 299 06/11/2021 0606  ? MCV 96.4 06/11/2021 0606  ? MCH 30.3 06/11/2021 0606  ? MCHC 31.5 06/11/2021 0606  ? RDW 13.9 06/11/2021 0606  ? LYMPHSABS 1.9 06/11/2021 0606  ? MONOABS 0.5 06/11/2021 0606  ? EOSABS 0.1 06/11/2021 0606  ? BASOSABS 0.0 06/11/2021 0606  ? ? ?No results found for: POCLITH, LITHIUM  ? ?No results found for: PHENYTOIN, PHENOBARB, VALPROATE, CBMZ  ? ?.res ?Assessment: Plan:   ? ?Plan: ? ?Continue Spravato treatment. ? ? ?Diagnoses and all orders for this visit: ? ?Recurrent major depression resistant to treatment [facility]) ? ?  ? ?Please see After Visit Summary for patient specific instructions. ? ?Future Appointments  ?Date Time Provider [place]  ?02/01/2022 11:45 AM Carlson-Long, Stephani Police [facility]  ?02/03/2022 12:00 PM Vedia Pereyra, PT [facility]  ?02/04/2022 12:45  PM Regal, Tamala Fothergill, DPM [facility]  ?02/09/2022  8:00 AM Carlson-Long, Stephani Police [facility]  ?02/10/2022  9:00 AM Vedia Pereyra, PT [facility]  ?02/11/2022 11:00 AM Blanchie Serve, PhD [facility]

## 2022-02-01 ENCOUNTER — Encounter (HOSPITAL_BASED_OUTPATIENT_CLINIC_OR_DEPARTMENT_OTHER): Payer: Self-pay | Admitting: Physical Therapy

## 2022-02-01 ENCOUNTER — Ambulatory Visit (HOSPITAL_BASED_OUTPATIENT_CLINIC_OR_DEPARTMENT_OTHER): Payer: 59 | Admitting: Physical Therapy

## 2022-02-01 DIAGNOSIS — M545 Low back pain, unspecified: Secondary | ICD-10-CM | POA: Diagnosis not present

## 2022-02-01 DIAGNOSIS — M6281 Muscle weakness (generalized): Secondary | ICD-10-CM

## 2022-02-01 DIAGNOSIS — R2689 Other abnormalities of gait and mobility: Secondary | ICD-10-CM

## 2022-02-01 DIAGNOSIS — M5459 Other low back pain: Secondary | ICD-10-CM

## 2022-02-01 DIAGNOSIS — R262 Difficulty in walking, not elsewhere classified: Secondary | ICD-10-CM

## 2022-02-01 NOTE — Therapy (Signed)
?OUTPATIENT PHYSICAL THERAPY TREATMENT NOTE ?Recert ? ?Patient Name: Casey Diaz ?MRN: [id number] ?DOB:10-Apr-1966, 56 y.o., female ?Today's Date: 02/01/2022 ? ?PCP: Creola Cornusso, John, MD ?REFERRING PROVIDER: Creola Cornusso, John, MD ? ? PT End of Session - 02/01/22 1159   ? ? Visit Number 9   ? Number of Visits 18   ? Date for PT Re-Evaluation 03/03/22   ? PT Start Time 1145   ? PT Stop Time 1225   ? PT Time Calculation (min) 40 min   ? Activity Tolerance Patient tolerated treatment well   ? Behavior [facility] for tasks assessed/performed   ? ?  ?  ? ?  ? ? ? ? ?Past Medical History:  ?Diagnosis Date  ? Abnormal Pap smear 2011  ? hpv/mild dysplasia,cin1  ? Anxiety   ? Cerebral palsy (HCC)   ? right arm/leg  ? Cystocele   ? Depression   ? Headache   ? Neuromuscular disorder (HCC)   ? Cerebral Palsy  ? OCD (obsessive compulsive disorder)   ? Osteoporosis   ? Uterine prolaps   ? ?Past Surgical History:  ?Procedure Laterality Date  ? COLPOSCOPY  2011  ? ELBOW SURGERY    ? left elbow-separation of bones -age 89  ? ELBOW SURGERY  2015  ? fell on ice- right elbow fracture  ? FRACTURE SURGERY    ? HAMMER TOE SURGERY  1/13  ? RIGHT SIDE  ? HIP PINNING,CANNULATED Left 07/08/2016  ? Procedure: LEFT CLOSED REDUCTION HIP AND PERCUTANEOUS SCREW;  Surgeon: Durene RomansMatthew Olin, MD;  Location: [facility];  Service: Orthopedics;  Laterality: Left;  ? ORIF HUMERUS FRACTURE Right 06/11/2021  ? Procedure: OPEN REDUCTION INTERNAL FIXATION (ORIF) DISTAL HUMERUS FRACTURE;  Surgeon: Myrene GalasHandy, Michael, MD;  Location: [facility];  Service: Orthopedics;  Laterality: Right;  ? WRIST SURGERY  2005  ? left wrist  ? ?Patient Active Problem List  ? Diagnosis Date Noted  ? 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HCC) 08/03/2018  ? Relationship problem with family member 08/03/2018  ? Femoral neck fracture, left, closed, initial encounter 07/08/2016  ? Severe recurrent major depression without psychotic features (HCC) 06/03/2015  ?  Class:  Chronic  ? Obsessive-compulsive disorder 06/03/2015  ?  Class: Chronic  ? ? ?REFERRING DIAG: M54.51 (ICD-10-CM) - Vertebrogenic low back pain  ? ?THERAPY DIAG:  ?No diagnosis found. ? ?PERTINENT HISTORY: CP ?OPEN REDUCTION INTERNAL FIXATION (ORIF) DISTAL HUMERUS FRACTURE ?IM nail Left hip 2017 ?Osteoporosis ? ?PRECAUTIONS: fall ? ?SUBJECTIVE: Rt ankle continues to be sore, along with neck. She notices her neck hurts after she sits in chair for 2 hrs for her depression treatment. "I had a pretty good work out last session".  ? ?PAIN:  ?Are you having pain? Yes ?NPRS scale:4/10  ?Pain location: neck/ back; Rt ankle  ?Pain orientation: R (ankle), bilat (neck/back) ?PAIN TYPE: aching ?Pain description: constant  ?Aggravating factors: walking  ?Relieving factors: rest ?   ?PRECAUTIONS: Fall ?  ?WEIGHT BEARING RESTRICTIONS No ?  ?FALLS:  ?Has patient fallen in last 6 months? No, Number of falls: 1 in Aug 2022 ?  ?LIVING ENVIRONMENT: ?Lives with: lives with their family ?Lives in: House/apartment ?Stairs: Yes; Internal: 15 steps; none ?Has following equipment at home: None ?  ?  ?PLOF: Independent with household mobility without device ?  ?PATIENT GOALS to get stronger, more coordinated and better balanced. ?  ?  ?OBJECTIVE:  ?  ?  ?PATIENT SURVEYS:  ?FOTO [id number] with  goal of 50 ?01/20/22: 53 ?  ?  ?COGNITION: ?         Overall cognitive status: Within functional limits for tasks assessed              ?          ?SENSATION: ?         intact ?  ?MUSCLE LENGTH: ?Hamstrings: Right 70 deg; Left 90 deg ?Thomas test: Right neg deg; Left neg deg ?  ?POSTURE:  ? left shoulder elevation, weightbearing shifted left ?  ?PALPATION: ?Tenderness bilat sub occipital and upper traps ?  ?LUMBARAROM/PROM ?  ?A/PROM A/PROM  ?12/15/2021  ?Flexion Forward reach 4 inches to floor  ?Extension    ?Right lateral flexion    ?Left lateral flexion    ?Right rotation    ?Left rotation    ? (Blank rows = not tested) ?  ?LE AROM/PROM: ?  ?A/PROM Right ?12/15/2021  Left ?12/15/2021 Right ?01/20/22 Left ?01/20/22  ?Hip flexion 90 105    ?Hip extension        ?Hip abduction 25 30 28  35  ?Hip adduction        ?Hip internal rotation        ?Hip external rotation        ?Knee flexion 105 120    ?Knee extension        ?Ankle dorsiflexion 5      ?Ankle plantarflexion 10      ?Ankle inversion        ?Ankle eversion        ? (Blank rows = not tested) ?  ?LE MMT: ?  ?MMT Right ?12/15/2021 Left ?12/15/2021  ?Hip flexion 5 4  ?Hip extension 4 4  ?Hip abduction 5 5  ?Hip adduction 5 5  ?Hip internal rotation      ?Hip external rotation      ?Knee flexion 5 5  ?Knee extension      ?Ankle dorsiflexion      ?Ankle plantarflexion      ?Ankle inversion      ?Ankle eversion      ? (Blank rows = not tested) ?  ?LUMBAR SPECIAL TESTS:  ?Straight leg raise test: Negative, Slump test: Negative, and Thomas test: Negative ?  ?FUNCTIONAL TESTS:  ?5 times sit to stand: 20 ?Timed up and go (TUG): 13 ?Berg Balance Scale: 36/56   ? ?02/01/22:  5x STS  = 12.09 sec (no UE support) ?  ?GAIT: ?Distance walked: 513ft ?Assistive device utilized: None ?Level of assistance: Complete Independence ?Comments: right trendelenburg, left leaning, no heel strike or knee extension right, shortened step length ?  ?  ?  ?  ?TODAY'S TREATMENT  ?Pt seen for aquatic therapy today.  Treatment took place in water 3.25-4.8 ft in depth at the 120f pool. Temp of water was 92?.  Pt entered/exited the pool via stairs (step to pattern) independently with single rail. ? ?Warm up:  ?Multiple widths of walking forward, backward, and sidestepping without UE support ?Standing: ? Heel / toe raises x 20  ?Toe walking / heel walking (shallow to/from deep) ?High knee marching with rainbow dumbbells under water ?3 way leg kicks with UE supported by yellow buoys x 10 each leg ?Side lunge and return to neutral holding rainbow dumbbells x 5 each side ?Forward lunge and return to neutral with single UE on wall x 5 each side ?Sit to/from stand  on bench in water with step under feet no  UE x 10 (quick); repeated on 4th step (slow) x 6 ?Runner stretch x 20 sec x 2 each leg ?UE shoulder add/abd, with rainbow buoys x 12; shoulder flex to neutral  x 5 each side ?Tandem gait forward / backward  ?  ? ?Pt requires buoyancy for support and to offload joints with strengthening exercises. Viscosity of the water is needed for resistance of strengthening; water current perturbations provides challenge to standing balance unsupported, requiring increased core activation. ? ?  ?  ?PATIENT EDUCATION:  ?Education details: Condition management; Properties of water; progression of exercise ?Person educated: Patient ?Education method: Explanation ?Education comprehension: verbalized understanding ?  ?  ?HOME EXERCISE PROGRAM: ?Access Code: [email or web address] ?URL: [email or web address] ?Date: 12/15/2021 ?Prepared by: Geni Bers ?  ?Exercises ?Supine Lower Trunk Rotation - 1 x daily - 7 x weekly - 3 sets - 10 reps ?Supine Single Knee to Chest Stretch - 1 x daily - 7 x weekly - 3 sets - 10 reps ?  ?ASSESSMENT: ?  ?CLINICAL IMPRESSION: ?Pt demonstrated improved 5x STS time by 8 seconds.  She continues to be challenged with Rt SLS exercises.  Very minor cues for posture.  Improved ability for sit to stand from low surface. Progressing well towards remaining goals.  Pt will continue to benefit from skilled physical therapy, the aquatic setting being optimal for enhancing her progression toward goals allowing freedom of movement through buoyancy to decrease pain and improve posture as well as decrease substitutions/compensations reducing gait deviations. ?  ?OBJECTIVE IMPAIRMENTS Abnormal gait, decreased activity tolerance, decreased balance, decreased coordination, decreased mobility, difficulty walking, decreased strength, and pain.  ?  ?ACTIVITY LIMITATIONS cleaning, community activity, yard work, and shopping.  ?  ?PERSONAL FACTORS Time since onset of  injury/illness/exacerbation and 3+ comorbidities:    are also affecting patient's functional outcome.  ?  ?  ?REHAB POTENTIAL: Good ?  ?CLINICAL DECISION MAKING: Evolving/moderate complexity ?  ?EVALUATION COMPLEXITY: Mo

## 2022-02-03 ENCOUNTER — Ambulatory Visit (HOSPITAL_BASED_OUTPATIENT_CLINIC_OR_DEPARTMENT_OTHER): Payer: 59 | Admitting: Physical Therapy

## 2022-02-03 ENCOUNTER — Encounter (HOSPITAL_BASED_OUTPATIENT_CLINIC_OR_DEPARTMENT_OTHER): Payer: Self-pay | Admitting: Physical Therapy

## 2022-02-03 DIAGNOSIS — M6281 Muscle weakness (generalized): Secondary | ICD-10-CM

## 2022-02-03 DIAGNOSIS — R262 Difficulty in walking, not elsewhere classified: Secondary | ICD-10-CM

## 2022-02-03 DIAGNOSIS — M545 Low back pain, unspecified: Secondary | ICD-10-CM | POA: Diagnosis not present

## 2022-02-03 DIAGNOSIS — R2689 Other abnormalities of gait and mobility: Secondary | ICD-10-CM

## 2022-02-03 DIAGNOSIS — M5459 Other low back pain: Secondary | ICD-10-CM

## 2022-02-03 NOTE — Therapy (Addendum)
?OUTPATIENT PHYSICAL THERAPY TREATMENT NOTE ?Recert ? ?Patient Name: Casey Diaz ?MRN: [id number] ?DOB:08-21-1966, 56 y.o., female ?Today's Date: 02/03/2022 ? ?PCP: Creola Corn, MD ?REFERRING PROVIDER: Creola Corn, MD ? ? PT End of Session - 02/03/22 1153   ? ? Visit Number 10   ? Number of Visits 18   ? Date for PT Re-Evaluation 03/03/22   ? PT Start Time 1149   ? PT Stop Time 1234   ? PT Time Calculation (min) 45 min   ? Activity Tolerance Patient tolerated treatment well   ? Behavior During Therapy [facility] for tasks assessed/performed   ? ?  ?  ? ?  ? ? ? ? ?Past Medical History:  ?Diagnosis Date  ? Abnormal Pap smear 2011  ? hpv/mild dysplasia,cin1  ? Anxiety   ? Cerebral palsy (HCC)   ? right arm/leg  ? Cystocele   ? Depression   ? Headache   ? Neuromuscular disorder (HCC)   ? Cerebral Palsy  ? OCD (obsessive compulsive disorder)   ? Osteoporosis   ? Uterine prolaps   ? ?Past Surgical History:  ?Procedure Laterality Date  ? COLPOSCOPY  2011  ? ELBOW SURGERY    ? left elbow-separation of bones -age 8  ? ELBOW SURGERY  2015  ? fell on ice- right elbow fracture  ? FRACTURE SURGERY    ? HAMMER TOE SURGERY  1/13  ? RIGHT SIDE  ? HIP PINNING,CANNULATED Left 07/08/2016  ? Procedure: LEFT CLOSED REDUCTION HIP AND PERCUTANEOUS SCREW;  Surgeon: Durene Romans, MD;  Location: [facility];  Service: Orthopedics;  Laterality: Left;  ? ORIF HUMERUS FRACTURE Right 06/11/2021  ? Procedure: OPEN REDUCTION INTERNAL FIXATION (ORIF) DISTAL HUMERUS FRACTURE;  Surgeon: Myrene Galas, MD;  Location: [facility];  Service: Orthopedics;  Laterality: Right;  ? WRIST SURGERY  2005  ? left wrist  ? ?Patient Active Problem List  ? Diagnosis Date Noted  ? 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HCC) 08/03/2018  ? Relationship problem with family member 08/03/2018  ? Femoral neck fracture, left, closed, initial encounter 07/08/2016  ? Severe recurrent major depression without psychotic features (HCC) 06/03/2015  ?  Class:  Chronic  ? Obsessive-compulsive disorder 06/03/2015  ?  Class: Chronic  ? ? ?REFERRING DIAG: M54.51 (ICD-10-CM) - Vertebrogenic low back pain  ? ?THERAPY DIAG:  ?Muscle weakness (generalized) ? ?Difficulty in walking, not elsewhere classified ? ?Other abnormalities of gait and mobility ? ?Arthralgia of lumbar spine ? ?PERTINENT HISTORY: CP ?OPEN REDUCTION INTERNAL FIXATION (ORIF) DISTAL HUMERUS FRACTURE ?IM nail Left hip 2017 ?Osteoporosis ? ?PRECAUTIONS: fall ? ?SUBJECTIVE: "You killed me last session.  I was like walking dead.  My hips!"   Pt reporting increase in bilat hip pain, neck and low back. She is more sore today than yesterday (session was 2 days ago)  ? ?PAIN:  ?Are you having pain? Yes ?NPRS scale: 6/10  ?Pain location: neck/ back; Rt ankle; bilat hips  ? ?PAIN TYPE: aching, sore ?Pain description: constant  ?Aggravating factors: walking  ?Relieving factors: rest ?   ?PRECAUTIONS: Fall ?  ?WEIGHT BEARING RESTRICTIONS No ?  ?FALLS:  ?Has patient fallen in last 6 months? No, Number of falls: 1 in Aug 2022 ?  ?LIVING ENVIRONMENT: ?Lives with: lives with their family ?Lives in: House/apartment ?Stairs: Yes; Internal: 15 steps; none ?Has following equipment at home: None ?  ?  ?PLOF: Independent with household mobility without device ?  ?PATIENT GOALS to get  stronger, more coordinated and better balanced. ?  ?  ?OBJECTIVE: * Findings taken at [facility] unless otherwise noted.  ?  ?  ?PATIENT SURVEYS:  ?FOTO 37 with goal of 50 ?01/20/22: 53 ?  ?  ?COGNITION: ?         Overall cognitive status: Within functional limits for tasks assessed              ?          ?SENSATION: ?         intact ?  ?MUSCLE LENGTH: ?Hamstrings: Right 70 deg; Left 90 deg ?Thomas test: Right neg deg; Left neg deg ?  ?POSTURE:  ? left shoulder elevation, weightbearing shifted left ?  ?PALPATION: ?Tenderness bilat sub occipital and upper traps ?  ?LUMBARAROM/PROM ?  ?A/PROM A/PROM  ?12/15/2021  ?Flexion Forward reach 4 inches to floor  ?Extension     ?Right lateral flexion    ?Left lateral flexion    ?Right rotation    ?Left rotation    ? (Blank rows = not tested) ?  ?LE AROM/PROM: ?  ?A/PROM Right ?12/15/2021 Left ?12/15/2021 Right ?01/20/22 Left ?01/20/22  ?Hip flexion 90 105    ?Hip extension        ?Hip abduction 25 30 28  35  ?Hip adduction        ?Hip internal rotation        ?Hip external rotation        ?Knee flexion 105 120    ?Knee extension        ?Ankle dorsiflexion 5      ?Ankle plantarflexion 10      ?Ankle inversion        ?Ankle eversion        ? (Blank rows = not tested) ?  ?LE MMT: ?  ?MMT Right ?12/15/2021 Left ?12/15/2021  ?Hip flexion 5 4  ?Hip extension 4 4  ?Hip abduction 5 5  ?Hip adduction 5 5  ?Hip internal rotation      ?Hip external rotation      ?Knee flexion 5 5  ?Knee extension      ?Ankle dorsiflexion      ?Ankle plantarflexion      ?Ankle inversion      ?Ankle eversion      ? (Blank rows = not tested) ?  ?LUMBAR SPECIAL TESTS:  ?Straight leg raise test: Negative, Slump test: Negative, and Thomas test: Negative ?  ?FUNCTIONAL TESTS:  ?5 times sit to stand: 20 ?Timed up and go (TUG): 13 ?Berg Balance Scale: 36/56   ? ?02/01/22:  5x STS  = 12.09 sec (no UE support) ?  ?GAIT: ?Distance walked: 512ft ?Assistive device utilized: None ?Level of assistance: Complete Independence ?Comments: right trendelenburg, left leaning, no heel strike or knee extension right, shortened step length ?  ?  ?  ?  ?TODAY'S TREATMENT  ?Pt seen for aquatic therapy today.  Treatment took place in water 3.25-4.8 ft in depth at the 120f pool. Temp of water was 91?.  Pt entered/exited the pool via stairs (step to pattern) independently with single rail. ? ?Warm up:  ?Multiple widths of walking forward, backward, and sidestepping without UE support ?Standing: ? Heel / toe raises x 20  ?Runner stretch x 20 sec x 2 each leg ?Kickboard push down x 5 sec x 10 (kickboard in vertical position) - cues on posture, then push/pull x 10 ?High knee marching forward/  backward with rainbow dumbbells under water ?Squats  x 15 without support  ?UE shoulder add/abd, with rainbow buoys x 5 unilateral; shoulder flex to neutral  x 5 each side ?Sit to/from stand on bench in water with step under feet no UE x 10 x 2 sets, 1 set slow, 1 set fast ?Side step and forward gait for rest and recovery ?3 way leg kicks with UE supported on wall  x 5 each leg ?Tandem gait forward / backward  ? ?Pt requires buoyancy for support and to offload joints with strengthening exercises. Viscosity of the water is needed for resistance of strengthening; water current perturbations provides challenge to standing balance unsupported, requiring increased core activation. ?  ?PATIENT EDUCATION:  ?Education details: Condition management;  progression of exercise ?Person educated: Patient ?Education method: Explanation ?Education comprehension: verbalized understanding ?  ?  ?HOME EXERCISE PROGRAM: ?Access Code: [email or web address] ?URL: [email or web address] ?Date: 12/15/2021 ?Prepared by: Geni BersFrankie Ziemba ?  ?Exercises ?Supine Lower Trunk Rotation - 1 x daily - 7 x weekly - 3 sets - 10 reps ?Supine Single Knee to Chest Stretch - 1 x daily - 7 x weekly - 3 sets - 10 reps ?  ?ASSESSMENT: ?  ?CLINICAL IMPRESSION: ?Pt arrived with elevated pain level, likely with DOMS from last session 2 days ago.  UE appeared fatigued with resistance exercises.  Modified exercises with less reps and recovery walking laps in between.  She reported significant reduction in pain in all areas by end of session.  ? Pt will continue to benefit from skilled physical therapy, the aquatic setting being optimal for enhancing her progression toward goals allowing freedom of movement through buoyancy to decrease pain and improve posture as well as decrease substitutions/compensations reducing gait deviations. ?  ?OBJECTIVE IMPAIRMENTS Abnormal gait, decreased activity tolerance, decreased balance, decreased coordination, decreased mobility,  difficulty walking, decreased strength, and pain.  ?  ?ACTIVITY LIMITATIONS cleaning, community activity, yard work, and shopping.  ?  ?PERSONAL FACTORS Time since onset of injury/illness/exacerbation and

## 2022-02-03 NOTE — Addendum Note (Signed)
Addended by: Lynnette Caffey F on: 02/03/2022 01:23 PM ? ? Modules accepted: Orders ? ?

## 2022-02-04 ENCOUNTER — Ambulatory Visit (INDEPENDENT_AMBULATORY_CARE_PROVIDER_SITE_OTHER): Payer: 59 | Admitting: Podiatry

## 2022-02-04 ENCOUNTER — Encounter: Payer: Self-pay | Admitting: Podiatry

## 2022-02-04 ENCOUNTER — Ambulatory Visit (HOSPITAL_BASED_OUTPATIENT_CLINIC_OR_DEPARTMENT_OTHER): Payer: Self-pay | Admitting: Physical Therapy

## 2022-02-04 DIAGNOSIS — G809 Cerebral palsy, unspecified: Secondary | ICD-10-CM

## 2022-02-04 DIAGNOSIS — R2681 Unsteadiness on feet: Secondary | ICD-10-CM

## 2022-02-04 DIAGNOSIS — M7671 Peroneal tendinitis, right leg: Secondary | ICD-10-CM | POA: Diagnosis not present

## 2022-02-04 MED ORDER — TRIAMCINOLONE ACETONIDE 10 MG/ML IJ SUSP
10.0000 mg | Freq: Once | INTRAMUSCULAR | Status: AC
  Administered 2022-02-04: 10 mg

## 2022-02-07 NOTE — Progress Notes (Signed)
Subjective:  ? ?Patient ID: Casey Diaz, female   DOB: 56 y.o.   MRN: [id number]  ? ?HPI ?Patient presents with continued problems with the right ankle with cerebral palsy as a long-term problem with inflammation that is not as severe in the subtalar joint but is more in the peroneal groove as it comes behind the back of the lateral malleolus.  Patient still has very abnormal gait and does not have enough support for the ankle ? ? ?ROS ? ? ?   ?Objective:  ?Physical Exam  ?Varus deformity of the right ankle which is creating stress on the lateral ankle with patient who has had brace in the past which was beneficial but has not had currently with inflammation of the peroneal tendon behind the lateral malleolus ? ?   ?Assessment:  ?Inflammatory condition with lateral inflammation of the peroneal groove with patient who does need bracing desperately at the current time ? ?   ?Plan:  ?We will again appeal to try to get her bracing due to her cerebral palsy and her deformity of her right ankle and today I did carefully do sterile prep and injected the sheath of the peroneal tendon 3 mg dexamethasone Kenalog 5 mg Xylocaine advised on reduced activity reappoint as needed ?   ? ? ?

## 2022-02-08 ENCOUNTER — Ambulatory Visit (INDEPENDENT_AMBULATORY_CARE_PROVIDER_SITE_OTHER): Payer: 59

## 2022-02-08 DIAGNOSIS — G809 Cerebral palsy, unspecified: Secondary | ICD-10-CM | POA: Diagnosis not present

## 2022-02-08 DIAGNOSIS — R2681 Unsteadiness on feet: Secondary | ICD-10-CM

## 2022-02-08 DIAGNOSIS — M7671 Peroneal tendinitis, right leg: Secondary | ICD-10-CM | POA: Diagnosis not present

## 2022-02-08 NOTE — Progress Notes (Signed)
SITUATION ?Reason for Consult: Evaluation for Bilateral Custom Foot Orthoses ?Patient / Caregiver Report: Patient is ready for foot orthotics ? ?OBJECTIVE DATA: ?Patient History / Diagnosis:  ?  ICD-10-CM   ?1. Peroneal tendinitis of right lower extremity  M76.71   ?  ?2. Gait instability  R26.81   ?  ?3. 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HCC)  G80.9   ?  ? ? ?Current or Previous Devices:   None and no history ? ?Foot Examination: ?Skin presentation:   Intact ?Ulcers & Callousing:   None ?Toe / Foot Deformities:  Pes cavus ?Weight Bearing Presentation:  Cavus ?Sensation:    Intact ? ?Shoe Size:    68M ? ?ORTHOTIC RECOMMENDATION ?Recommended Device: 1x pair of custom functional foot orthotics ? ?GOALS OF ORTHOSES ?- Reduce Pain ?- Prevent Foot Deformity ?- Prevent Progression of Further Foot Deformity ?- Relieve Pressure ?- Improve the Overall Biomechanical Function of the Foot and Lower Extremity. ? ?ACTIONS PERFORMED ?Potential out of pocket cost was communicated to patient. Patient understood and consent to casting. Patient was casted for Foot Orthoses via crush box. Procedure was explained and patient tolerated procedure well. Casts were shipped to central fabrication. All questions were answered and concerns addressed. ? ?PLAN ?Patient is to be called for fitting when devices are ready.  ? ? ?

## 2022-02-08 NOTE — Progress Notes (Signed)
SITUATION ?Patient Name:  Casey Diaz ?MRN:   [id number] ?Reason for Visit: Evaluation for Speciality AFO ? ?Patient Report: ?Chief Complaint:   Foot drop ?Nature of Discomfort/Pain:  Ambulatory ?Location:    Right lower extremity ?Onset & Duration:   Present longer than 3 months ?Course:    unchanged ?Aggravating or Alleviating Factors: Ambulation ? ?OBJECTIVE DATA & MEASUREMENTS ?Prognosis:    Good ?Duration of use:   5 years ? ?Diagnosis: ?  ICD-10-CM   ?1. Peroneal tendinitis of right lower extremity  M76.71   ?  ?2. Gait instability  R26.81   ?  ?3. 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HCC)  G80.9   ?  ? ? ?Patient Weight:   ?Shoe Size:   63M ? ?GOALS, NECESSITIES, & JUSTIFICATIONS ?Recommended Device: [place] WalkOn Trimmable ?Size:    Medium ?Side:    right ? ?Laterality HCPCS Code Description Justification  ?right [phone] Ankle foot orthosis (AFO), spiral, ([facility] type), plastic or other material, prefabricated, includes fitting and adjustment Necessary to prevent foot drop during ambulation in order to prevent tripping and injury.  ? ? ?I certify that [name] qualifies for and will benefit from an ankle foot orthosis used during ambulation based on meeting all of the following criteria;  ? ?The patient is: ?- Ambulatory, and ?- Has weakness or deformity of the foot and ankle, and ?- Requires stabilization for medical reasons, and ?- Has the potential to benefit functionally ? ?The patient?s medical record contains sufficient documentation of the patients medical condition to substantiate the necessity for the type and quantity of the items ordered. ? ?The goals of this therapy: ?- Improve Mobility ?- Improve Lower Extremity Stability ?- Decrease Pain ? ?I hereby certify that the ankle foot orthotic described above is a rigid or semi-rigid device which is used for the purpose of supporting a weak or deformed body member or restricting or eliminating motion in a diseased or injured  part of the body. It is designed to provide support and counterforce on the limb or body part that is being braced. In my opinion, the ankle foot orthosis reasonable and necessary in reference to accepted standards of medical practice in the treatment  ?of the patient condition and rehabilitation. ? ?ACTIONS PERFORMED ?Patient was evaluated and measured for Prefabricated Carbon Spiral AFO. Procedure was explained to patient. Patient tolerated procedure. patient selected device color and closure method.  ? ?PLAN ?Patient to return in four to six weeks for fitting and delivery of device. Plan of care was explained to and agreed upon by patient. All questions were answered and concerns addressed. ? ? ? ? ? ?

## 2022-02-09 ENCOUNTER — Ambulatory Visit: Payer: 59

## 2022-02-09 ENCOUNTER — Ambulatory Visit (HOSPITAL_BASED_OUTPATIENT_CLINIC_OR_DEPARTMENT_OTHER): Payer: 59 | Admitting: Physical Therapy

## 2022-02-09 ENCOUNTER — Ambulatory Visit (INDEPENDENT_AMBULATORY_CARE_PROVIDER_SITE_OTHER): Payer: 59 | Admitting: Psychiatry

## 2022-02-09 ENCOUNTER — Encounter: Payer: Self-pay | Admitting: Psychiatry

## 2022-02-09 VITALS — BP 118/76 | HR 82

## 2022-02-09 DIAGNOSIS — F4010 Social phobia, unspecified: Secondary | ICD-10-CM

## 2022-02-09 DIAGNOSIS — F339 Major depressive disorder, recurrent, unspecified: Secondary | ICD-10-CM

## 2022-02-09 DIAGNOSIS — F422 Mixed obsessional thoughts and acts: Secondary | ICD-10-CM | POA: Diagnosis not present

## 2022-02-09 DIAGNOSIS — F5105 Insomnia due to other mental disorder: Secondary | ICD-10-CM

## 2022-02-09 DIAGNOSIS — Z636 Dependent relative needing care at home: Secondary | ICD-10-CM

## 2022-02-09 DIAGNOSIS — G809 Cerebral palsy, unspecified: Secondary | ICD-10-CM

## 2022-02-09 MED ORDER — LORAZEPAM 1 MG PO TABS: ORAL_TABLET | ORAL | 3 refills | Status: DC

## 2022-02-09 NOTE — Progress Notes (Signed)
NURSE Visit: ?  ?Pt arrived for her weekly Spravato Treatment, she is going on her 24th dose, she continues with 84 mg (3 of the 28 mg) Spravato nasal spray. 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 Her B/P at 3:10 PM 122/82, 98, Pt instructed to blow her nose and to recline back at 45 degrees. Pt given first nasal spray (28 mg) administered by pt observed by nurse. There were 5 minutes between each dose, total of 84 mg. Tolerated well. Pt's medication is delivered by [facility] in [place] and stored inside a safe behind a locked door as well. Spravato is a CIII medication and has to be only given at a treatment facility and observed by nurse as pt administered intranasally.  Pt's 40 minute vital signs at 3:55 PM 102/45, 88. Dr.Cottle came to discuss her care at the end of her treatment when her thoughts are clearer. She voiced no complaints, no headaches, nausea, vomiting, mood better today. She does go to the bathroom at least once during her treatment. No sedation and had slight feeling of being "high" she reports.  Discharge vitals at 5:10 PM 118/76, 82. Pt stable for discharge, pt is scheduled next Wednesday, May 3rd. ?Pt was observed on site a total of 120 minutes per FDA/REMS requirements. Pt was with nurse for clinical assessment a total of 55 minutes.  ?  ?LOT [id number] ?EXP 2024 JUL ?

## 2022-02-09 NOTE — Progress Notes (Signed)
Casey Diaz ?[id number] ?05/16/1966 ?56 y.o. ? ? ? ?Subjective:  ? ?Patient ID:  Casey Diaz is a 56 y.o. (DOB 08/29/1966) female. ? ?Chief Complaint:  ?Chief Complaint  ?Patient presents with  ? Follow-up  ? Depression  ? Anxiety  ? ? ? ?HPI ?Casey Diaz presents to the office today for follow-up of OCD and severe anxiety.   ?  ?December 2019 visit the following was noted: No meds were changed. ?Lives in [place] and back for followup.  Sx are about the same.  Has to take meds with different sizes. ?Pt reports that mood is Anxious and Depressed and describes anxiety as Severe. Anxiety symptoms include: Excessive Worry, ?Obsessive Compulsive Symptoms:   Checking,,. Pt reports has interrupted sleep and nocturia. Pt reports that appetite is good. Pt reports that energy is no change and down slightly. Concentration is down slightly. Suicidal thoughts:  denied by patient. ?Loves the environment of [place] but misses some things there.  She's not able to work there.  H works there and likes it.  Struggled with not working, feels isolated and not up to task of meeting people.  Does attend a church and met a friend who's been helpful.  Leaving for [place] on 10/16/18. ?  ?04/09/2020 appointment the following is noted: ? Staying another year in [place] bc Covid and other things. ?Last few months a lot of crying spells.  Is in menopause. ?Wonders about med changes though is nervous about it.  Crying spells associated with depressing thoughts more than stress or OCD.   ?Covid really hard on everyone and couldn't see family for 18 mos.  Family still very dysfunctional. ?No close friends in part due to OCD and depression. ?Son high Autism spectrum with ADHD and anxiety and she's with him all the time. ?Greater health problems with CP so more pains.  ? ?05/15/20 appt with the following noted: ?Started Estroven for menopause and helps some. ?Still depressed.  Chronically. ?In [place] more weeks then to [place] for another  year. ?A lot of stressors lately triggering more checking and anxiety.   ?OCD is her CC now and seems.  Got worse DT stress.   ?Stressed with Asberger's son and her health.  H works a lot.  Her FOO still stress. ?Plan: Trintellix 10 mg 1 tablet in the morning with food and reduce fluvoxamine to 5 tablets nightly for 1 week  ?then reduce it to 4 tablets nightly.  ? ?07/02/20 appt with the following noted: ?Decided not to get Trintellix bc difficulty getting it. ?It is available.  There.  Wants to start it now.   ?Both depression and OCD are severe.  Not suicidal in intent or plan. ?Did not take samples with her to [place] but will be back in December. ?covid is worse there and travel is difficult.  ?Wants to reduce Wellbutrin DT dry mouth. ?Plan: She's afraid to reduce Luvox at this time DT fear of worsening OCD.  But will consider. ?Trintellix 10 mg 1 tablet in the morning with food and reduce fluvoxamine to 5 tablets nightly for 1 week  ?then reduce it to 4 tablets nightly. ?Also reduce Wellbutrin XL to 300 mg daily.  ?  ?9-13 2022 appointment with the following noted: ?[place]  Broke arm a month ago and surgery.  It's all been rough adjustment.   ?B has cancer on his face and M fell taking him to the doctor.  Misses the water   and weather of [place].   ?Cry a lot more since menopause. Still depression and anxiety and OCD.  Asks about ketamine. ?On Wellbutrin 300, Luvox 300.  No Trintellix. ?Added Ativan 2 mg AM and HS and it helps.  More likely to get upset at night. ?Plan: Increase Luvox back to 400 mg daily.  She thinks she's worse on less. ?Continue Wellbutrin XL to 300 mg daily. ?Plan to start Spravato for TRD asap  ? ?09/27/2021 appointment with the following noted:  ?She has started Spravato today at 54 mg intranasally.  She tolerated it well without unusual nausea or vomiting headache or other somatic symptoms.  She did have the expected dissociation which gradually resolved over the course  of the 2-hour period of observation.  She was a little concerned about her balance given her cerebral palsy but has not noted unusual or unexpected problems.  She is motivated to can continue Spravato in hopes of reducing her depressive symptoms. 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  She also has treatment resistant OCD which is partially managed with medications but is still quite disabling.  She is tolerating the medications well.  She is sleeping adequately.  Her appetite is adequate.  She is not having suicidal thoughts.  She continues to wish for a better treatment for OCD that would give her some relief. ? ?09/30/2021 appointment with the following noted: ?She received her first dose of Spravato 84 mg intranasally today.  She tolerated it well without unusual nausea, vomiting, or other somatic symptoms.  Dissociation as expected did occur and gradually resolved over the 2-hour period of observation.  She did have a mild headache today with the treatment and received ibuprofen 600 mg at her request.  We will follow this to see if it is a pattern ?Patient is still depressed.  She said she was late with her medicine today and today was a particularly depressing day.  However she notes that the Spravato has lifted her mood considerably even today.  She is hopeful that it will continue to be helpful.  No suicidal thoughts.  She has ongoing chronic anxiety and OCD at baseline. ? ?10/04/21 appt noted: ?Patient received Spravato 84 mg for the second time today.  She tolerated it well without any unusual headache, nausea or vomiting or other somatic symptoms.  Dissociation did occur and she gradually [place] resolution over the 2-hour period of observation. ?She did not have any unusual problems after she left the office last Spravato administration.  She did not have any specific problems with balance or walking.  She is at increased risk of that difficulty because of cerebral  palsy.  So far she has not noticed much mood effect from the medication beyond the first day of receiving it.  However she would like to continue Spravato in hopes of getting the antidepressant effect that is desired. ?Stress dealing with mother's behavior at party pt hosted.  Guilt over it. ? ?10/07/2021 appointment noted: ?Patient received Spravato 84 mg for the second time today.  She tolerated it well without any unusual headache, nausea or vomiting or other somatic symptoms.  Dissociation did occur and she gradually [place] resolution over the 2-hour period of observation. ?She still is not sure about the antidepressant effect of Spravato.  Events over the holidays and demands, make it difficult to assess.  She still notes that the OCD tends to worsen the depression and vice versa.  She tolerates the Spravato well and  wants to continue the trial. ? ?10/15/2021 appointment with the following noted: ?Patient received Spravato 84 mg for the second time today.  She tolerated it well without any unusual headache, nausea or vomiting or other somatic symptoms.  Dissociation did occur and she gradually [place] resolution over the 2-hour period of observation. ?Patient says it was somewhat difficult to evaluate the effect of the Spravato.  It was scheduled to be twice weekly for 4 weeks consecutively but the holidays have interfered with that administration.  She asked what specifically should be she should be looking for in order to assess improvement.  That was discussed.  The OCD is unchanged and the depression so far is not significantly different.  She still tolerates meds.  There have been no recent med changes ? ?10/19/2021 appt noted: ?Patient received Spravato 84 mg for the second today.  She tolerated it well without any unusual headache, nausea or vomiting or other somatic symptoms.  Dissociation did occur and she gradually saw resolution over the 2-hour period of observation.  ? ?10/21/2021 appointment noted: ?Patient  received Spravato 84 mg today.  She tolerated it well without any unusual headache, nausea or vomiting or other somatic symptoms.  Dissociation did occur and she gradually saw resolution over the 2-hour per

## 2022-02-10 ENCOUNTER — Ambulatory Visit (HOSPITAL_BASED_OUTPATIENT_CLINIC_OR_DEPARTMENT_OTHER): Payer: 59 | Admitting: Physical Therapy

## 2022-02-11 ENCOUNTER — Ambulatory Visit (INDEPENDENT_AMBULATORY_CARE_PROVIDER_SITE_OTHER): Payer: 59 | Admitting: Psychiatry

## 2022-02-11 DIAGNOSIS — Z636 Dependent relative needing care at home: Secondary | ICD-10-CM

## 2022-02-11 DIAGNOSIS — F339 Major depressive disorder, recurrent, unspecified: Secondary | ICD-10-CM

## 2022-02-11 DIAGNOSIS — F422 Mixed obsessional thoughts and acts: Secondary | ICD-10-CM | POA: Diagnosis not present

## 2022-02-11 DIAGNOSIS — G809 Cerebral palsy, unspecified: Secondary | ICD-10-CM | POA: Diagnosis not present

## 2022-02-11 DIAGNOSIS — F4010 Social phobia, unspecified: Secondary | ICD-10-CM

## 2022-02-11 DIAGNOSIS — Z638 Other specified problems related to primary support group: Secondary | ICD-10-CM
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
## 2022-02-15 ENCOUNTER — Ambulatory Visit (HOSPITAL_BASED_OUTPATIENT_CLINIC_OR_DEPARTMENT_OTHER): Payer: Self-pay | Admitting: Physical Therapy

## 2022-02-16 ENCOUNTER — Ambulatory Visit: Payer: 59

## 2022-02-16 ENCOUNTER — Ambulatory Visit (INDEPENDENT_AMBULATORY_CARE_PROVIDER_SITE_OTHER): Payer: 59 | Admitting: Psychiatry

## 2022-02-16 VITALS — BP 118/81 | HR 78

## 2022-02-16 DIAGNOSIS — F422 Mixed obsessional thoughts and acts: Secondary | ICD-10-CM | POA: Diagnosis not present

## 2022-02-16 DIAGNOSIS — F5105 Insomnia due to other mental disorder: Secondary | ICD-10-CM | POA: Diagnosis not present

## 2022-02-16 DIAGNOSIS — F339 Major depressive disorder, recurrent, unspecified: Secondary | ICD-10-CM

## 2022-02-16 DIAGNOSIS — G809 Cerebral palsy, unspecified: Secondary | ICD-10-CM

## 2022-02-16 DIAGNOSIS — F4010 Social phobia, unspecified: Secondary | ICD-10-CM | POA: Diagnosis not present

## 2022-02-16 NOTE — Progress Notes (Signed)
Casey Diaz ?[id number] ?[birth year] ?56 y.o. ? ? ? ?Subjective:  ? ?Patient ID:  Casey Diaz is a 56 y.o. (DOB [birth year]) female. ? ?Chief Complaint:  ?Chief Complaint  ?Patient presents with  ? Follow-up  ? Depression  ? Anxiety  ? Fatigue  ? ? ? ?HPI ?Casey Diaz presents to the office today for follow-up of OCD and severe anxiety.   ?  ?December 2019 visit the following was noted: No meds were changed. ?Lives in [name] and back for followup.  Sx are about the same.  Has to take meds with different sizes. ?Pt reports that mood is Anxious and Depressed and describes anxiety as Severe. Anxiety symptoms include: Excessive Worry, ?Obsessive Compulsive Symptoms:   Checking,,. Pt reports has interrupted sleep and nocturia. Pt reports that appetite is good. Pt reports that energy is no change and down slightly. Concentration is down slightly. Suicidal thoughts:  denied by patient. ?Loves the environment of [name] but misses some things there.  She's not able to work there.  H works there and likes it.  Struggled with not working, feels isolated and not up to task of meeting people.  Does attend a church and met a friend who's been helpful.  Leaving for [name] on 10/16/18. ?  ?04/09/2020 appointment the following is noted: ? Staying another year in [name] bc Covid and other things. ?Last few months a lot of crying spells.  Is in menopause. ?Wonders about med changes though is nervous about it.  Crying spells associated with depressing thoughts more than stress or OCD.   ?Covid really hard on everyone and couldn't see family for 18 mos.  Family still very dysfunctional. ?No close friends in part due to OCD and depression. ?Son high Autism spectrum with ADHD and anxiety and she's with him all the time. ?Greater health problems with CP so more pains.  ? ?05/15/20 appt with the following noted: ?Started Estroven for menopause and helps some. ?Still depressed.  Chronically. ?In [place] for 2 more weeks then to [name] for  another year. ?A lot of stressors lately triggering more checking and anxiety.   ?OCD is her CC now and seems.  Got worse DT stress.   ?Stressed with Asberger's son and her health.  H works a lot.  Her FOO still stress. ?Plan: Trintellix 10 mg 1 tablet in the morning with food and reduce fluvoxamine to 5 tablets nightly for 1 week  ?then reduce it to 4 tablets nightly.  ? ?07/02/20 appt with the following noted: ?Decided not to get Trintellix bc difficulty getting it. ?It is available.  There.  Wants to start it now.   ?Both depression and OCD are severe.  Not suicidal in intent or plan. ?Did not take samples with her to [name] but will be back in December. ?covid is worse there and travel is difficult.  ?Wants to reduce Wellbutrin DT dry mouth. ?Plan: She's afraid to reduce Luvox at this time DT fear of worsening OCD.  But will consider. ?Trintellix 10 mg 1 tablet in the morning with food and reduce fluvoxamine to 5 tablets nightly for 1 week  ?then reduce it to 4 tablets nightly. ?Also reduce Wellbutrin XL to 300 mg daily.  ?  ?9-13 2022 appointment with the following noted: ?Back in [place] since July 14.  Broke arm a month ago and surgery.  It's all been rough adjustment.   ?B has cancer on his face and M fell taking him to the doctor.  Misses the water and weather of [place].   ?Cry a lot more since menopause. Still depression and anxiety and OCD.  Asks about ketamine. ?On Wellbutrin 300, Luvox 300.  No Trintellix. ?Added Ativan 2 mg AM and HS and it helps.  More likely to get upset at night. ?Plan: Increase Luvox back to 400 mg daily.  She thinks she's worse on less. ?Continue Wellbutrin XL to 300 mg daily. ?Plan to start Spravato for TRD asap  ? ?09/27/2021 appointment with the following noted:  ?She has started Spravato today at 54 mg intranasally.  She tolerated it well without unusual nausea or vomiting headache or other somatic symptoms.  She did have the expected dissociation which gradually resolved over  the course of the 2-hour period of observation.  She was a little concerned about her balance given her cerebral palsy but has not noted unusual or unexpected problems.  She is motivated to can continue Spravato in hopes of reducing her depressive symptoms. 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  She also has treatment resistant OCD which is partially managed with medications but is still quite disabling.  She is tolerating the medications well.  She is sleeping adequately.  Her appetite is adequate.  She is not having suicidal thoughts.  She continues to wish for a better treatment for OCD that would give her some relief. ? ?09/30/2021 appointment with the following noted: ?She received her first dose of Spravato 84 mg intranasally today.  She tolerated it well without unusual nausea, vomiting, or other somatic symptoms.  Dissociation as expected did occur and gradually resolved over the 2-hour period of observation.  She did have a mild headache today with the treatment and received ibuprofen 600 mg at her request.  We will follow this to see if it is a pattern ?Patient is still depressed.  She said she was late with her medicine today and today was a particularly depressing day.  However she notes that the Spravato has lifted her mood considerably even today.  She is hopeful that it will continue to be helpful.  No suicidal thoughts.  She has ongoing chronic anxiety and OCD at baseline. ? ?10/04/21 appt noted: ?Patient received Spravato 84 mg for the second time today.  She tolerated it well without any unusual headache, nausea or vomiting or other somatic symptoms.  Dissociation did occur and she gradually [place] resolution over the 2-hour period of observation. ?She did not have any unusual problems after she left the office last Spravato administration.  She did not have any specific problems with balance or walking.  She is at increased risk of that difficulty because of  cerebral palsy.  So far she has not noticed much mood effect from the medication beyond the first day of receiving it.  However she would like to continue Spravato in hopes of getting the antidepressant effect that is desired. ?Stress dealing with mother's behavior at party pt hosted.  Guilt over it. ? ?10/07/2021 appointment noted: ?Patient received Spravato 84 mg for the second time today.  She tolerated it well without any unusual headache, nausea or vomiting or other somatic symptoms.  Dissociation did occur and she gradually [place] resolution over the 2-hour period of observation. ?She still is not sure about the antidepressant effect of Spravato.  Events over the holidays and demands, make it difficult to assess.  She still notes that the OCD tends to worsen the depression and vice versa.  She tolerates the  Spravato well and wants to continue the trial. ? ?10/15/2021 appointment with the following noted: ?Patient received Spravato 84 mg for the second time today.  She tolerated it well without any unusual headache, nausea or vomiting or other somatic symptoms.  Dissociation did occur and she gradually [place] resolution over the 2-hour period of observation. ?Patient says it was somewhat difficult to evaluate the effect of the Spravato.  It was scheduled to be twice weekly for 4 weeks consecutively but the holidays have interfered with that administration.  She asked what specifically should be she should be looking for in order to assess improvement.  That was discussed.  The OCD is unchanged and the depression so far is not significantly different.  She still tolerates meds.  There have been no recent med changes ? ?10/19/2021 appt noted: ?Patient received Spravato 84 mg for the second today.  She tolerated it well without any unusual headache, nausea or vomiting or other somatic symptoms.  Dissociation did occur and she gradually saw resolution over the 2-hour period of observation.  ? ?10/21/2021 appointment  noted: ?Patient received Spravato 84 mg today.  She tolerated it well without any unusual headache, nausea or vomiting or other somatic symptoms.  Dissociation did occur and she gradually saw resolution over the

## 2022-02-17 ENCOUNTER — Encounter (HOSPITAL_BASED_OUTPATIENT_CLINIC_OR_DEPARTMENT_OTHER): Payer: Self-pay | Admitting: Physical Therapy

## 2022-02-17 ENCOUNTER — Ambulatory Visit (HOSPITAL_BASED_OUTPATIENT_CLINIC_OR_DEPARTMENT_OTHER): Payer: 59 | Attending: Orthopedic Surgery | Admitting: Physical Therapy

## 2022-02-17 DIAGNOSIS — M6281 Muscle weakness (generalized): Secondary | ICD-10-CM | POA: Insufficient documentation

## 2022-02-17 DIAGNOSIS — R2689 Other abnormalities of gait and mobility: Secondary | ICD-10-CM | POA: Insufficient documentation

## 2022-02-17 DIAGNOSIS — M5459 Other low back pain: Secondary | ICD-10-CM | POA: Diagnosis present

## 2022-02-17 DIAGNOSIS — R262 Difficulty in walking, not elsewhere classified: Secondary | ICD-10-CM | POA: Insufficient documentation

## 2022-02-17 NOTE — Therapy (Addendum)
?OUTPATIENT PHYSICAL THERAPY TREATMENT NOTE ? ? ?Patient Name: Casey Diaz ?MRN: [id number] ?DOB:1966-04-19, 56 y.o., female ?Today's Date: 02/17/2022 ? ?PCP: Creola Corn, MD ?REFERRING PROVIDER: Creola Corn, MD ? ? PT End of Session - 02/17/22 1120   ? ? Visit Number 11   ? Number of Visits 18   ? Date for PT Re-Evaluation 03/16/22   ? PT Start Time 1116   ? PT Stop Time 1200   ? PT Time Calculation (min) 44 min   ? ?  ?  ? ?  ? ? ? ? ? ?Past Medical History:  ?Diagnosis Date  ? Abnormal Pap smear 2011  ? hpv/mild dysplasia,cin1  ? Anxiety   ? Cerebral palsy (HCC)   ? right arm/leg  ? Cystocele   ? Depression   ? Headache   ? Neuromuscular disorder (HCC)   ? Cerebral Palsy  ? OCD (obsessive compulsive disorder)   ? Osteoporosis   ? Uterine prolaps   ? ?Past Surgical History:  ?Procedure Laterality Date  ? COLPOSCOPY  2011  ? ELBOW SURGERY    ? left elbow-separation of bones -age 89  ? ELBOW SURGERY  2015  ? fell on ice- right elbow fracture  ? FRACTURE SURGERY    ? HAMMER TOE SURGERY  1/13  ? RIGHT SIDE  ? HIP PINNING,CANNULATED Left 07/08/2016  ? Procedure: LEFT CLOSED REDUCTION HIP AND PERCUTANEOUS SCREW;  Surgeon: Durene Romans, MD;  Location: [facility];  Service: Orthopedics;  Laterality: Left;  ? ORIF HUMERUS FRACTURE Right 06/11/2021  ? Procedure: OPEN REDUCTION INTERNAL FIXATION (ORIF) DISTAL HUMERUS FRACTURE;  Surgeon: Myrene Galas, MD;  Location: [facility];  Service: Orthopedics;  Laterality: Right;  ? WRIST SURGERY  2005  ? left wrist  ? ?Patient Active Problem List  ? Diagnosis Date Noted  ? 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HCC) 08/03/2018  ? Relationship problem with family member 08/03/2018  ? Femoral neck fracture, left, closed, initial encounter 07/08/2016  ? Severe recurrent major depression without psychotic features (HCC) 06/03/2015  ?  Class: Chronic  ? Obsessive-compulsive disorder 06/03/2015  ?  Class: Chronic  ? ? ?REFERRING DIAG: M54.51 (ICD-10-CM) -  Vertebrogenic low back pain  ? ?THERAPY DIAG:  ?Muscle weakness (generalized) ? ?Difficulty in walking, not elsewhere classified ? ?Other abnormalities of gait and mobility ? ?Arthralgia of lumbar spine ? ?PERTINENT HISTORY: CP ?OPEN REDUCTION INTERNAL FIXATION (ORIF) DISTAL HUMERUS FRACTURE ?IM nail Left hip 2017 ?Osteoporosis ? ?PRECAUTIONS: fall ? ?SUBJECTIVE: Pt concerned about time between sessions.  Worked to get her scheduled.  She reports mostly ankle pain today no cervical or back pain ? ?PAIN:  ?Are you having pain? Yes ?NPRS scale: 5/10 current ankle.  No back pain ?Pain location: neck/ back; Rt ankle; bilat hips  ? ?PAIN TYPE: aching, sore ?Pain description: constant  ?Aggravating factors: walking  ?Relieving factors: rest ?   ?PRECAUTIONS: Fall ?  ?WEIGHT BEARING RESTRICTIONS No ?  ?FALLS:  ?Has patient fallen in last 6 months? No, Number of falls: 1 in Aug 2022 ?  ?LIVING ENVIRONMENT: ?Lives with: lives with their family ?Lives in: House/apartment ?Stairs: Yes; Internal: 15 steps; none ?Has following equipment at home: None ?  ?  ?PLOF: Independent with household mobility without device ?  ?PATIENT GOALS to get stronger, more coordinated and better balanced. ?  ?  ?OBJECTIVE: * Findings taken at [facility] unless otherwise noted.  ?  ?  ?PATIENT SURVEYS:  ?FOTO 45  with goal of 50 ?01/20/22: 53 ?  ?  ?COGNITION: ?         Overall cognitive status: Within functional limits for tasks assessed              ?          ?SENSATION: ?         intact ?  ?MUSCLE LENGTH: ?Hamstrings: Right 70 deg; Left 90 deg ?Thomas test: Right neg deg; Left neg deg ?  ?POSTURE:  ? left shoulder elevation, weightbearing shifted left ?  ?PALPATION: ?Tenderness bilat sub occipital and upper traps ?  ?LUMBARAROM/PROM ?  ?A/PROM A/PROM  ?12/15/2021  ?Flexion Forward reach 4 inches to floor  ?Extension    ?Right lateral flexion    ?Left lateral flexion    ?Right rotation    ?Left rotation    ? (Blank rows = not tested) ?  ?LE AROM/PROM: ?   ?A/PROM Right ?12/15/2021 Left ?12/15/2021 Right ?01/20/22 Left ?01/20/22  ?Hip flexion 90 105    ?Hip extension        ?Hip abduction 25 30 28  35  ?Hip adduction        ?Hip internal rotation        ?Hip external rotation        ?Knee flexion 105 120    ?Knee extension        ?Ankle dorsiflexion 5      ?Ankle plantarflexion 10      ?Ankle inversion        ?Ankle eversion        ? (Blank rows = not tested) ?  ?LE MMT: ?  ?MMT Right ?12/15/2021 Left ?12/15/2021  ?Hip flexion 5 4  ?Hip extension 4 4  ?Hip abduction 5 5  ?Hip adduction 5 5  ?Hip internal rotation      ?Hip external rotation      ?Knee flexion 5 5  ?Knee extension      ?Ankle dorsiflexion      ?Ankle plantarflexion      ?Ankle inversion      ?Ankle eversion      ? (Blank rows = not tested) ?  ?LUMBAR SPECIAL TESTS:  ?Straight leg raise test: Negative, Slump test: Negative, and Thomas test: Negative ?  ?FUNCTIONAL TESTS:  ?5 times sit to stand: 20 ?Timed up and go (TUG): 13 ?Berg Balance Scale: 36/56   ? ?02/01/22:  5x STS  = 12.09 sec (no UE support) ?  ?GAIT: ?Distance walked: 524ft ?Assistive device utilized: None ?Level of assistance: Complete Independence ?Comments: right trendelenburg, left leaning, no heel strike or knee extension right, shortened step length ?  ?  ?  ?  ?TODAY'S TREATMENT  ?Pt seen for aquatic therapy today.  Treatment took place in water 3.25-4.8 ft in depth at the 120f pool. Temp of water was 91?.  Pt entered/exited the pool via stairs (step to pattern) independently with single rail. ? ?Warm up:  ?Multiple widths of walking forward, backward, and sidestepping without UE support ?STS 3 step x10 ?Addct sets with buoyancy ball 10s hold x10 ?STS 3 step x10 with addct set holding ball between knees ? Heel / toe raises 3x 10  bottom step  ? ?Kickboard push down x 5 sec x 10 (kickboard in vertical position) - cues on posture, then push/pull x 10 ?High knee marching forward/ backward with rainbow dumbbells under water ?Squats 3x  10 without support bottom step ?UE shoulder add/abd, with rainbow buoys  x 5 unilateral; shoulder flex to neutral  x 5 each side ? ?Backward and forward gait for rest and recovery between exercises ? ?Pt requires buoyancy for support and to offload joints with strengthening exercises. Viscosity of the water is needed for resistance of strengthening; water current perturbations provides challenge to standing balance unsupported, requiring increased core activation. ?  ?PATIENT EDUCATION:  ?Education details: Condition management;  progression of exercise ?Person educated: Patient ?Education method: Explanation ?Education comprehension: verbalized understanding ?  ?  ?HOME EXERCISE PROGRAM: ?Access Code: [email or web address] ?URL: [email or web address] ?Date: 12/15/2021 ?Prepared by: Geni BersFrankie Dondrell Loudermilk ?  ?Exercises ?Supine Lower Trunk Rotation - 1 x daily - 7 x weekly - 3 sets - 10 reps ?Supine Single Knee to Chest Stretch - 1 x daily - 7 x weekly - 3 sets - 10 reps ?  ?ASSESSMENT: ?  ?CLINICAL IMPRESSION: ?Pt wanting more frequent visits/sessions for improved carryover.  Was able to accommodate. She is having low pain this morning.  Tolerates session very well.  ROM of cervical spine and shoulders wfl.  She has been compliant with HEP using new membership here at [facility]. Increased focus on core strengthening today.  She was able to demonstrate improved core control with STS maintaining abddct position with ball between knees which also added immediate balance challenge.  Some difficulty initially but improved with repetition. No rest periods needed today demonstrating also improvement in toleration to activity. ? ? ?OBJECTIVE IMPAIRMENTS Abnormal gait, decreased activity tolerance, decreased balance, decreased coordination, decreased mobility, difficulty walking, decreased strength, and pain.  ?  ?ACTIVITY LIMITATIONS cleaning, community activity, yard work, and shopping.  ?  ?PERSONAL FACTORS Time since onset of  injury/illness/exacerbation and 3+ comorbidities:    are also affecting patient's functional outcome.  ?  ?  ?REHAB POTENTIAL: Good ?  ?CLINICAL DECISION MAKING: Evolving/moderate complexity ?  ?EVALUATION COMPLEXITY:

## 2022-02-20 NOTE — Progress Notes (Signed)
NURSE Visit: ?  ?Pt arrived for her weekly Spravato Treatment, she is going on her 25th dose, she continues with 84 mg (3 of the 28 mg) Spravato nasal spray. 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 Her B/P at 3:20 PM 125/79, 84, Pt instructed to blow her nose and to recline back at 45 degrees. Pt given first nasal spray (28 mg) administered by pt observed by nurse. There were 5 minutes between each dose, total of 84 mg. Tolerated well. Pt's medication is delivered by [facility] in [place] and stored inside a safe behind a locked door as well. Spravato is a CIII medication and has to be only given at a treatment facility and observed by nurse as pt administered intranasally.  Pt's 40 minute vital signs at 4:00 PM 115/77, 84. Dr.Cottle came to discuss her care at the end of her treatment when her thoughts are clearer. She voiced no complaints, no headaches, nausea, vomiting, mood better today. She does go to the bathroom at least once during her treatment. No sedation and had slight feeling of being "high" she reports.  Discharge vitals at 5:20 PM 118/81, 78. Pt stable for discharge, pt is scheduled next Monday, May 8th. ?Pt was observed on site a total of 120 minutes per FDA/REMS requirements. Pt was with nurse for clinical assessment a total of 55 minutes.  ?  ?LOT [id number] ?EXP 2024 JUL ?

## 2022-02-21 ENCOUNTER — Encounter: Payer: Self-pay | Admitting: Psychiatry

## 2022-02-21 ENCOUNTER — Ambulatory Visit (INDEPENDENT_AMBULATORY_CARE_PROVIDER_SITE_OTHER): Payer: 59 | Admitting: Psychiatry

## 2022-02-21 ENCOUNTER — Ambulatory Visit: Payer: 59

## 2022-02-21 VITALS — BP 120/80 | HR 86

## 2022-02-21 DIAGNOSIS — F5105 Insomnia due to other mental disorder: Secondary | ICD-10-CM | POA: Diagnosis not present

## 2022-02-21 DIAGNOSIS — F339 Major depressive disorder, recurrent, unspecified: Secondary | ICD-10-CM

## 2022-02-21 DIAGNOSIS — F4010 Social phobia, unspecified: Secondary | ICD-10-CM

## 2022-02-21 DIAGNOSIS — G809 Cerebral palsy, unspecified: Secondary | ICD-10-CM

## 2022-02-21 DIAGNOSIS — F422 Mixed obsessional thoughts and acts: Secondary | ICD-10-CM | POA: Diagnosis not present

## 2022-02-22 ENCOUNTER — Encounter (HOSPITAL_BASED_OUTPATIENT_CLINIC_OR_DEPARTMENT_OTHER): Payer: Self-pay | Admitting: Physical Therapy

## 2022-02-22 ENCOUNTER — Ambulatory Visit (HOSPITAL_BASED_OUTPATIENT_CLINIC_OR_DEPARTMENT_OTHER): Payer: 59 | Admitting: Physical Therapy

## 2022-02-22 DIAGNOSIS — R2689 Other abnormalities of gait and mobility: Secondary | ICD-10-CM

## 2022-02-22 DIAGNOSIS — M6281 Muscle weakness (generalized): Secondary | ICD-10-CM | POA: Diagnosis not present

## 2022-02-22 DIAGNOSIS — M5459 Other low back pain: Secondary | ICD-10-CM

## 2022-02-22 DIAGNOSIS — R262 Difficulty in walking, not elsewhere classified: Secondary | ICD-10-CM

## 2022-02-22 NOTE — Therapy (Addendum)
?OUTPATIENT PHYSICAL THERAPY TREATMENT NOTE ? ? ?Patient Name: Casey Diaz ?MRN: [id number] ?DOB:July 14, 1966, 56 y.o., female ?Today's Date: 02/22/2022 ? ?PCP: Shon Baton, MD ?REFERRING PROVIDER: Shon Baton, MD ? ?Visit #:12/18 ?Re-assess: 03/16/22 ?Time in:1115 ?Time Out:1200p ?Total time:45 min ?Tolerated well ? ? ? ? ?Past Medical History:  ?Diagnosis Date  ? Abnormal Pap smear 2011  ? hpv/mild dysplasia,cin1  ? Anxiety   ? Cerebral palsy ([place])   ? right arm/leg  ? Cystocele   ? Depression   ? Headache   ? Neuromuscular disorder ([place])   ? Cerebral Palsy  ? OCD (obsessive compulsive disorder)   ? Osteoporosis   ? Uterine prolaps   ? ?Past Surgical History:  ?Procedure Laterality Date  ? COLPOSCOPY  2011  ? ELBOW SURGERY    ? left elbow-separation of bones -age 67  ? ELBOW SURGERY  2015  ? fell on ice- right elbow fracture  ? FRACTURE SURGERY    ? HAMMER TOE SURGERY  1/13  ? RIGHT SIDE  ? HIP PINNING,CANNULATED Left 07/08/2016  ? Procedure: LEFT CLOSED REDUCTION HIP AND PERCUTANEOUS SCREW;  Surgeon: Paralee Cancel, MD;  Location: [facility];  Service: Orthopedics;  Laterality: Left;  ? ORIF HUMERUS FRACTURE Right 06/11/2021  ? Procedure: OPEN REDUCTION INTERNAL FIXATION (ORIF) DISTAL HUMERUS FRACTURE;  Surgeon: Altamese Beatrice, MD;  Location: [facility];  Service: Orthopedics;  Laterality: Right;  ? WRIST SURGERY  2005  ? left wrist  ? ?Patient Active Problem List  ? Diagnosis Date Noted  ? 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[place]) 08/03/2018  ? Relationship problem with family member 08/03/2018  ? Femoral neck fracture, left, closed, initial encounter 07/08/2016  ? Severe recurrent major depression without psychotic features ([place]) 06/03/2015  ?  Class: Chronic  ? Obsessive-compulsive disorder 06/03/2015  ?  Class: Chronic  ? ? ?REFERRING DIAG: M54.51 (ICD-10-CM) - Vertebrogenic low back pain  ? ?THERAPY DIAG:  ?Muscle weakness (generalized) ? ?Difficulty in walking, not elsewhere  classified ? ?Other abnormalities of gait and mobility ? ?Arthralgia of lumbar spine ? ?PERTINENT HISTORY: CP ?OPEN REDUCTION INTERNAL FIXATION (ORIF) DISTAL HUMERUS FRACTURE ?IM nail Left hip 2017 ?Osteoporosis ? ?PRECAUTIONS: fall ? ?SUBJECTIVE:  "Doing well.  Back hurting me some" ? ?PAIN:  ?Are you having pain? Yes ?NPRS scale: 5/10 current back ?Pain location: neck/ back; Rt ankle; bilat hips  ? ?PAIN TYPE: aching, sore ?Pain description: constant  ?Aggravating factors: walking  ?Relieving factors: rest ?   ?PRECAUTIONS: Fall ?  ?WEIGHT BEARING RESTRICTIONS No ?  ?FALLS:  ?Has patient fallen in last 6 months? No, Number of falls: 1 in Aug 2022 ?  ?LIVING ENVIRONMENT: ?Lives with: lives with their family ?Lives in: House/apartment ?Stairs: Yes; Internal: 15 steps; none ?Has following equipment at home: None ?  ?  ?PLOF: Independent with household mobility without device ?  ?PATIENT GOALS to get stronger, more coordinated and better balanced. ?  ?  ?OBJECTIVE: * Findings taken at [facility] unless otherwise noted.  ?  ?  ?PATIENT SURVEYS:  ?FOTO 75 with goal of 50 ?01/20/22: 53 ?  ?  ?COGNITION: ?         Overall cognitive status: Within functional limits for tasks assessed              ?          ?SENSATION: ?         intact ?  ?MUSCLE LENGTH: ?Hamstrings: Right 70 deg;  Left 90 deg ?Thomas test: Right neg deg; Left neg deg ?  ?POSTURE:  ? left shoulder elevation, weightbearing shifted left ?  ?PALPATION: ?Tenderness bilat sub occipital and upper traps ?  ?LUMBARAROM/PROM ?  ?A/PROM A/PROM  ?12/15/2021  ?Flexion Forward reach 4 inches to floor  ?Extension    ?Right lateral flexion    ?Left lateral flexion    ?Right rotation    ?Left rotation    ? (Blank rows = not tested) ?  ?LE AROM/PROM: ?  ?A/PROM Right ?12/15/2021 Left ?12/15/2021 Right ?01/20/22 Left ?01/20/22  ?Hip flexion 90 105    ?Hip extension        ?Hip abduction $RemoveBefore'25 30 28 '[id number]$ 35  ?Hip adduction        ?Hip internal rotation        ?Hip external rotation        ?Knee  flexion 105 120    ?Knee extension        ?Ankle dorsiflexion 5      ?Ankle plantarflexion 10      ?Ankle inversion        ?Ankle eversion        ? (Blank rows = not tested) ?  ?LE MMT: ?  ?MMT Right ?12/15/2021 Left ?12/15/2021  ?Hip flexion 5 4  ?Hip extension 4 4  ?Hip abduction 5 5  ?Hip adduction 5 5  ?Hip internal rotation      ?Hip external rotation      ?Knee flexion 5 5  ?Knee extension      ?Ankle dorsiflexion      ?Ankle plantarflexion      ?Ankle inversion      ?Ankle eversion      ? (Blank rows = not tested) ?  ?LUMBAR SPECIAL TESTS:  ?Straight leg raise test: Negative, Slump test: Negative, and Thomas test: Negative ?  ?FUNCTIONAL TESTS:  ?5 times sit to stand: 20 ?Timed up and go (TUG): 13 ?Berg Balance Scale: 36/56   ? ?02/01/22:  5x STS  = 12.09 sec (no UE support) ?  ?GAIT: ?Distance walked: 567ft ?Assistive device utilized: None ?Level of assistance: Complete Independence ?Comments: right trendelenburg, left leaning, no heel strike or knee extension right, shortened step length ?  ?  ?  ?  ?TODAY'S TREATMENT  ?Pt seen for aquatic therapy today.  Treatment took place in water 3.25-4.8 ft in depth at the [facility] pool. Temp of water was 91?.  Pt entered/exited the pool via stairs (step to pattern) independently with single rail. ? ?Warm up:  ?Multiple widths of walking forward, backward, and sidestepping without UE support ? ?Straddling yellow noodle: ?Cycling ?Jumping add/abd 3x10 ?Jumping scissor 3x10 ? ?Balance ?Sitting on yellow noodle (difficult) able to maintain with "swimmy arms and legs" ? ?   Seated ?Addct sets with buoyancy ball 10s hold x10 ?STS 3 step x10 with addct set holding ball between knees ?STS from 4th step x10 Cues for immediate standing balance ?  ?   Standing ?High knee marching forward/ backward with rainbow dumbbells under water ?Squats 2x 10 without support 3rd step ? ?Backward and forward gait for rest and recovery between exercises ? ?Pt requires buoyancy for  support and to offload joints with strengthening exercises. Viscosity of the water is needed for resistance of strengthening; water current perturbations provides challenge to standing balance unsupported, requiring increased core activation. ?  ?PATIENT EDUCATION:  ?Education details: Condition management;  progression of exercise ?Person educated: Patient ?Education method: Explanation ?  Education comprehension: verbalized understanding ?  ?  ?HOME EXERCISE PROGRAM: ?Access Code: [email or web address] ?URL: [email or web address] ?Date: 12/15/2021 ?Prepared by: Denton Meek ?  ?Exercises ?Supine Lower Trunk Rotation - 1 x daily - 7 x weekly - 3 sets - 10 reps ?Supine Single Knee to Chest Stretch - 1 x daily - 7 x weekly - 3 sets - 10 reps ?  ?ASSESSMENT: ?  ?CLINICAL IMPRESSION: ?Increased intensity of exercises adding modified jumping jacks and scissor jumps completed in 30ft for adequate LB support.  STS completed less submerged increasing balance challenge and core engagement.  Balance sitting on noodle also added in which pt has difficulty with requiring movement in ue as well as LE to maintain falling off of noodle x2.  Overall tolerance is very good with decrease LBP upon completion of session.  Will con't to progress activities geered toward meeting set goals. ? ? ?OBJECTIVE IMPAIRMENTS Abnormal gait, decreased activity tolerance, decreased balance, decreased coordination, decreased mobility, difficulty walking, decreased strength, and pain.  ?  ?ACTIVITY LIMITATIONS cleaning, community activity, yard work, and shopping.  ?  ?PERSONAL FACTORS Time since onset of injury/illness/exacerbation and 3+ comorbidities:    are also affecting patient's functional outcome.  ?  ?  ?REHAB POTENTIAL: Good ?  ?CLINICAL DECISION MAKING: Evolving/moderate complexity ?  ?EVALUATION COMPLEXITY: Moderate ?  ?  ?GOALS: ?Goals reviewed with patient? Yes ?  ?SHORT TERM GOALS: ?  ?STG Name Target Date Goal status  ?1 Pt will  tolerate entire sessions of aquatic therapy without increase in cervical or LBP ?Baseline:  01/05/2022  ?Achieved ?01/20/22  ?2 Pt will improve bilat hip abduction by 5d ?Baseline: R 25d; L30d 01/05/2022  ?Partially met ?01/20/22  ?3

## 2022-02-24 ENCOUNTER — Encounter (HOSPITAL_BASED_OUTPATIENT_CLINIC_OR_DEPARTMENT_OTHER): Payer: Self-pay | Admitting: Physical Therapy

## 2022-02-24 ENCOUNTER — Ambulatory Visit (HOSPITAL_BASED_OUTPATIENT_CLINIC_OR_DEPARTMENT_OTHER): Payer: 59 | Admitting: Physical Therapy

## 2022-02-24 DIAGNOSIS — M6281 Muscle weakness (generalized): Secondary | ICD-10-CM | POA: Diagnosis not present

## 2022-02-24 DIAGNOSIS — R2689 Other abnormalities of gait and mobility: Secondary | ICD-10-CM

## 2022-02-24 DIAGNOSIS — M5459 Other low back pain: Secondary | ICD-10-CM

## 2022-02-24 DIAGNOSIS — R262 Difficulty in walking, not elsewhere classified: Secondary | ICD-10-CM

## 2022-02-24 NOTE — Therapy (Addendum)
?OUTPATIENT PHYSICAL THERAPY TREATMENT NOTE ? ? ?Patient Name: Casey Diaz ?MRN: [id number] ?DOB:03-23-1966, 56 y.o., female ?Today's Date: 03/01/2022 ? ?PCP: Shon Baton, MD ?REFERRING PROVIDER: Shon Baton, MD ? ?Visit #: 13/18 ?Re-eval 03/16/22 ?Time in:1117 ?Time out:1200 ?Total time:43 min ?Tolerated well ? ? ? ? ? ? ?Past Medical History:  ?Diagnosis Date  ? Abnormal Pap smear 2011  ? hpv/mild dysplasia,cin1  ? Anxiety   ? Cerebral palsy ([place])   ? right arm/leg  ? Cystocele   ? Depression   ? Headache   ? Neuromuscular disorder ([place])   ? Cerebral Palsy  ? OCD (obsessive compulsive disorder)   ? Osteoporosis   ? Uterine prolaps   ? ?Past Surgical History:  ?Procedure Laterality Date  ? COLPOSCOPY  2011  ? ELBOW SURGERY    ? left elbow-separation of bones -age 89  ? ELBOW SURGERY  2015  ? fell on ice- right elbow fracture  ? FRACTURE SURGERY    ? HAMMER TOE SURGERY  1/13  ? RIGHT SIDE  ? HIP PINNING,CANNULATED Left 07/08/2016  ? Procedure: LEFT CLOSED REDUCTION HIP AND PERCUTANEOUS SCREW;  Surgeon: Paralee Cancel, MD;  Location: [facility];  Service: Orthopedics;  Laterality: Left;  ? ORIF HUMERUS FRACTURE Right 06/11/2021  ? Procedure: OPEN REDUCTION INTERNAL FIXATION (ORIF) DISTAL HUMERUS FRACTURE;  Surgeon: Altamese [place], MD;  Location: [facility];  Service: Orthopedics;  Laterality: Right;  ? WRIST SURGERY  2005  ? left wrist  ? ?Patient Active Problem List  ? Diagnosis Date Noted  ? 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 Relationship problem with family member 08/03/2018  ? Femoral neck fracture, left, closed, initial encounter 07/08/2016  ? Severe recurrent major depression without psychotic features ([place]) 06/03/2015  ?  Class: Chronic  ? Obsessive-compulsive disorder 06/03/2015  ?  Class: Chronic  ? ? ?REFERRING DIAG: M54.51 (ICD-10-CM) - Vertebrogenic low back pain  ? ?THERAPY DIAG:  ?Muscle weakness (generalized) ? ?Difficulty in walking, not elsewhere  classified ? ?Other abnormalities of gait and mobility ? ?Arthralgia of lumbar spine ? ?PERTINENT HISTORY: CP ?OPEN REDUCTION INTERNAL FIXATION (ORIF) DISTAL HUMERUS FRACTURE ?IM nail Left hip 2017 ?Osteoporosis ? ?PRECAUTIONS: fall ? ?SUBJECTIVE:  "You killed my last visit.  I was very soar (back 8/10) for the rest of that day.  Better now"  ankle not bothering her today ? ?PAIN:  ?Are you having pain? Yes ?NPRS scale: 4/10 current back ?Pain location: neck/ back; Rt ankle; bilat hips  ? ?PAIN TYPE: aching, sore ?Pain description: constant  ?Aggravating factors: walking  ?Relieving factors: rest ?   ?PRECAUTIONS: Fall ?  ?WEIGHT BEARING RESTRICTIONS No ?  ?FALLS:  ?Has patient fallen in last 6 months? No, Number of falls: 1 in Aug 2022 ?  ?LIVING ENVIRONMENT: ?Lives with: lives with their family ?Lives in: House/apartment ?Stairs: Yes; Internal: 15 steps; none ?Has following equipment at home: None ?  ?  ?PLOF: Independent with household mobility without device ?  ?PATIENT GOALS to get stronger, more coordinated and better balanced. ?  ?  ?OBJECTIVE: * Findings taken at [facility] unless otherwise noted.  ?  ?  ?PATIENT SURVEYS:  ?FOTO 84 with goal of 50 ?01/20/22: 53 ?  ?  ?COGNITION: ?         Overall cognitive status: Within functional limits for tasks assessed              ?          ?  SENSATION: ?         intact ?  ?MUSCLE LENGTH: ?Hamstrings: Right 70 deg; Left 90 deg ?Thomas test: Right neg deg; Left neg deg ?  ?POSTURE:  ? left shoulder elevation, weightbearing shifted left ?  ?PALPATION: ?Tenderness bilat sub occipital and upper traps ?  ?LUMBARAROM/PROM ?  ?A/PROM A/PROM  ?12/15/2021  ?Flexion Forward reach 4 inches to floor  ?Extension    ?Right lateral flexion    ?Left lateral flexion    ?Right rotation    ?Left rotation    ? (Blank rows = not tested) ?  ?LE AROM/PROM: ?  ?A/PROM Right ?12/15/2021 Left ?12/15/2021 Right ?01/20/22 Left ?01/20/22  ?Hip flexion 90 105    ?Hip extension        ?Hip abduction 25 30 28  35  ?Hip  adduction        ?Hip internal rotation        ?Hip external rotation        ?Knee flexion 105 120    ?Knee extension        ?Ankle dorsiflexion 5      ?Ankle plantarflexion 10      ?Ankle inversion        ?Ankle eversion        ? (Blank rows = not tested) ?  ?LE MMT: ?  ?MMT Right ?12/15/2021 Left ?12/15/2021  ?Hip flexion 5 4  ?Hip extension 4 4  ?Hip abduction 5 5  ?Hip adduction 5 5  ?Hip internal rotation      ?Hip external rotation      ?Knee flexion 5 5  ?Knee extension      ?Ankle dorsiflexion      ?Ankle plantarflexion      ?Ankle inversion      ?Ankle eversion      ? (Blank rows = not tested) ?  ?LUMBAR SPECIAL TESTS:  ?Straight leg raise test: Negative, Slump test: Negative, and Thomas test: Negative ?  ?FUNCTIONAL TESTS:  ?5 times sit to stand: 20 ?Timed up and go (TUG): 13 ?Berg Balance Scale: 36/56   ? ?02/01/22:  5x STS  = 12.09 sec (no UE support) ?  ?GAIT: ?Distance walked: 533ft ?Assistive device utilized: None ?Level of assistance: Complete Independence ?Comments: right trendelenburg, left leaning, no heel strike or knee extension right, shortened step length ?  ?  ?  ?  ?TODAY'S TREATMENT  ?Pt seen for aquatic therapy today.  Treatment took place in water 3.25-4.8 ft in depth at the [facility] pool. Temp of water was 91?.  Pt entered/exited the pool via stairs (step to pattern) independently with single rail. ? ?Warm up:  ?Multiple widths of walking forward, backward, and sidestepping without UE support ? ?Straddling yellow noodle: ?Cycling varying speeds x 8 minutes for aerobic capacity ? ? ?   Seated ?Addct sets with buoyancy ball 10s hold x10 ?STS 3 step x10 with addct set holding ball between knees. Cues for slowed movement to improve control ?STS from 4th step x10 Cues for immediate standing balance. Pt gains immediate standing balance 10/10 times ?  ?   Standing ?Holding to wall hip flex/abd/external rotation x10 R/L ?High knee marching forward/ backward with rainbow dumbbells under  water x6 widths ?Side lunges using 1 foam hand buoy shoulder add/abd x4 widths. Short rest period between.  Pt with difficulty coordinating. ?Step ups R/L x10 without UE support ea ?UE: pushing buoyancy ball against wall x 15 with cues to extend [place] as  able maintaining abd bracing for core stabilization throughout ? ?Backward and forward gait for rest and recovery between exercises ? ?Pt requires buoyancy for support and to offload joints with strengthening exercises. Viscosity of the water is needed for resistance of strengthening; water current perturbations provides challenge to standing balance unsupported, requiring increased core activation. ?  ?PATIENT EDUCATION:  ?Education details: Condition management;  progression of exercise ?Person educated: Patient ?Education method: Explanation ?Education comprehension: verbalized understanding ?  ?  ?HOME EXERCISE PROGRAM: ?Access Code: [email or web address] ?URL: [email or web address] ?Date: 12/15/2021 ?Prepared by: Denton Meek ?  ?Exercises ?Supine Lower Trunk Rotation - 1 x daily - 7 x weekly - 3 sets - 10 reps ?Supine Single Knee to Chest Stretch - 1 x daily - 7 x weekly - 3 sets - 10 reps ?  ?ASSESSMENT: ?  ?CLINICAL IMPRESSION: ?Pt with increase of LBP after last session.  We did increase intensity last visit which we modified today. Eliminated jumping.  Will return to at later session with fewer reps as tolerated.  Pt with improved core control with immediate standing balance as evidenced with STS activity. Goals ongoing ? ? ? ? ?OBJECTIVE IMPAIRMENTS Abnormal gait, decreased activity tolerance, decreased balance, decreased coordination, decreased mobility, difficulty walking, decreased strength, and pain.  ?  ?ACTIVITY LIMITATIONS cleaning, community activity, yard work, and shopping.  ?  ?PERSONAL FACTORS Time since onset of injury/illness/exacerbation and 3+ comorbidities:    are also affecting patient's functional outcome.  ?  ?  ?REHAB POTENTIAL:  Good ?  ?CLINICAL DECISION MAKING: Evolving/moderate complexity ?  ?EVALUATION COMPLEXITY: Moderate ?  ?  ?GOALS: ?Goals reviewed with patient? Yes ?  ?SHORT TERM GOALS: ?  ?STG Name Target Date Goal status  ?1 Pt

## 2022-02-24 NOTE — Progress Notes (Signed)
NURSE Visit: ?  ?Pt arrived for her weekly Spravato Treatment, she is going on her 26th dose, she continues with 84 mg (3 of the 28 mg) Spravato nasal spray. 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 Her B/P at 3:47 PM 113/76, 86, Pt instructed to blow her nose and to recline back at 45 degrees. Pt given first nasal spray (28 mg) administered by pt observed by nurse. There were 5 minutes between each dose, total of 84 mg. Tolerated well. Pt's medication is delivered by [facility] in [place] and stored inside a safe behind a locked door as well. Spravato is a CIII medication and has to be only given at a treatment facility and observed by nurse as pt administered intranasally.  Pt's 40 minute vital signs at 4:35 PM 106/68, 85. Dr.Cottle came to discuss her care at the end of her treatment when her thoughts are clearer. She voiced no complaints, no headaches, nausea, vomiting, mood better today. She does go to the bathroom at least once during her treatment. No sedation and had slight feeling of being "high" she reports.  Discharge vitals at 5:30 PM 120/80, 86. Pt stable for discharge, pt is scheduled next Monday, May 15th. ?Pt was observed on site a total of 120 minutes per FDA/REMS requirements. Pt was with nurse for clinical assessment a total of 55 minutes.  ?  ?LOT [id number] ?EXP 2024 JUL ?

## 2022-02-25 ENCOUNTER — Ambulatory Visit (HOSPITAL_BASED_OUTPATIENT_CLINIC_OR_DEPARTMENT_OTHER): Payer: Self-pay | Admitting: Physical Therapy

## 2022-02-27 ENCOUNTER — Other Ambulatory Visit: Payer: Self-pay | Admitting: Psychiatry

## 2022-02-27 DIAGNOSIS — F339 Major depressive disorder, recurrent, unspecified: Secondary | ICD-10-CM

## 2022-02-27 NOTE — Telephone Encounter (Signed)
Has  appt with Dr. Clovis Pu Monday ?

## 2022-02-28 ENCOUNTER — Other Ambulatory Visit: Payer: Self-pay

## 2022-02-28 ENCOUNTER — Ambulatory Visit (INDEPENDENT_AMBULATORY_CARE_PROVIDER_SITE_OTHER): Payer: 59 | Admitting: Behavioral Health

## 2022-02-28 ENCOUNTER — Ambulatory Visit: Payer: 59

## 2022-02-28 VITALS — BP 127/69 | HR 82

## 2022-02-28 DIAGNOSIS — F339 Major depressive disorder, recurrent, unspecified: Secondary | ICD-10-CM

## 2022-02-28 MED ORDER — SPRAVATO (84 MG DOSE) 28 MG/DEVICE NA SOPK: PACK | NASAL | 5 refills | Status: DC

## 2022-02-28 NOTE — Progress Notes (Signed)
Casey Diaz is a 56 y.o. (DOB [birth year]) female. ? ?Chief Complaint: No chief complaint on file. ? ? ?HPI ? ? Casey Diaz presents today for Sprivato  treatment today.  ?  ?Patient was administered Spravato 84 mg intranasally today. She was observed by provider throughout [facility] treatment. The patient again, experienced significant dissociation - out of body experience - "nothing concerning to me",  which gradually resolved over the 2-hour period of observation. There were no complications. Specifically , the patient did not have any prolonged side effects - feeling disconnected from herself, her thoughts, feelings and things around her, dizziness, nausea, feeling sleepy, decreased feeling of sensitivity (numbness)spinning sensation, feeling anxious, lack of energy, increased blood pressure, nausea, vomiting, feeling drunk, or feeling happy or very excited, nausea or vomiting or headache. Blood pressures remained within normal ranges at the 40-minute and 2-hour follow-up intervals. By the time the 2-hour observation period was met the patient was alert and oriented and able to exit without assistance. 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 See nursing note for further details.   ? ? ?ECT-MADRS   ? ?[place] Office Visit from 06/29/2021 in [facility]  ?MADRS Total Score 36  ? ?  ? ?Flowsheet Row Admission (Discharged) from 06/11/2021 in [facility]  ?C-SSRS RISK CATEGORY No Risk  ? ?  ?  ? ?Review of Systems:  ?Review of Systems ? ?Medications: I have reviewed the patient's current medications. ? ?Current Outpatient Medications  ?Medication Sig Dispense Refill  ?? Abaloparatide (TYMLOS) 3120 MCG/1.56ML SOPN Inject into the skin.    ?? Azelastine-Fluticasone 137-50 MCG/ACT SUSP Place 1-2 sprays into both  nostrils daily.    ?? baclofen (LIORESAL) 10 MG tablet Take 20 mg by mouth at bedtime as needed for muscle spasms.    ?? buPROPion (WELLBUTRIN XL) 150 MG 24 hr tablet Take 3 tablets (450 mg total) by mouth daily. 270 tablet 0  ?? buPROPion (WELLBUTRIN XL) 300 MG 24 hr tablet TAKE 1 TABLET BY MOUTH EVERY DAY 90 tablet 0  ?? dicyclomine (BENTYL) 10 MG capsule Take 10 mg by mouth daily.    ?? docusate sodium (COLACE) 100 MG capsule Take 1 capsule (100 mg total) by mouth 2 (two) times daily. (Patient taking differently: Take 100 mg by mouth daily.) 10 capsule 0  ?? Esketamine HCl, 84 MG Dose, (SPRAVATO, 84 MG DOSE,) 28 MG/DEVICE SOPK USE NASALLY AS DIRECTED ON PAKCAGE LABEL TWICE A WEEK 3 each 0  ?? fexofenadine (ALLEGRA) 180 MG tablet Take 180 mg by mouth daily.    ?? fluvoxaMINE (LUVOX) 100 MG tablet Take 3 tablets (300 mg total) by mouth at bedtime. 270 tablet 0  ?? fluvoxaMINE (LUVOX) 100 MG tablet Take 1 tablet (100 mg total) by mouth at bedtime. 90 tablet 0  ?? hydrocortisone (ANUSOL-HC) 2.5 % rectal cream Place rectally 2 (two) times daily. x 7-14 days 30 g 0  ?? ketotifen (ZADITOR) 0.025 % ophthalmic solution Place 3 drops into both eyes at bedtime.    ?? LORazepam (ATIVAN) 1 MG tablet 2 in the AM and HS and 1 tablet in afternoon 150 tablet 3  ?? magnesium gluconate (MAGONATE) 500 MG tablet Take 500 mg by mouth daily.    ?? MIBELAS 24 FE 1-20 MG-MCG(24) CHEW Chew 1 tablet by mouth at bedtime as  needed (bowel regularity).    ?? Multiple Vitamins-Minerals (ADULT GUMMY PO) Take 2 tablets by mouth in the morning.    ?? nitrofurantoin (MACRODANTIN) 100 MG capsule Take 100 mg by mouth as needed (For urinary tract infection.).     ?? oxyCODONE-acetaminophen (PERCOCET/ROXICET) 5-325 MG tablet Take 1-2 tablets by mouth every 6 (six) hours as needed for severe pain. 50 tablet 0  ?? polyethylene glycol (MIRALAX / GLYCOLAX) packet Take 17 g by mouth daily as needed for mild constipation. 14 each 0  ?? psyllium (METAMUCIL)  58.6 % powder Take 1 packet by mouth daily as needed (constipation).    ?? temazepam (RESTORIL) 30 MG capsule Take 1 capsule (30 mg total) by mouth at bedtime as needed for sleep. 30 capsule 5  ?? Vitamin D-Vitamin K (VITAMIN K2-VITAMIN D3 PO) Take 1-2 sprays by mouth daily.    ? ?No current facility-administered medications for this visit.  ? ? ?Medication Side Effects: None ? ?Allergies:  ?Allergies  ?Allergen Reactions  ?? Hydrocodone Itching  ?? Sulfamethoxazole-Trimethoprim Itching  ?? Dust Mite Extract Other (See Comments)  ?  Sneezing, watery eyes, runny nose  ?? Latex Itching  ?? Other Other (See Comments)  ?  PT IS ALLERGIC TO CAT DANDER AND RAGWEED - Sneezing, watery eyes, runny nose ?  ?? Pollen Extract Other (See Comments)  ?  Sneezing, watery eyes, runny nose ?  ? ? ?Past Medical History:  ?Diagnosis Date  ?? Abnormal Pap smear 2011  ? hpv/mild dysplasia,cin1  ?? Anxiety   ?? Cerebral palsy ([place])   ? right arm/leg  ?? Cystocele   ?? Depression   ?? Headache   ?? Neuromuscular disorder ([place])   ? Cerebral Palsy  ?? OCD (obsessive compulsive disorder)   ?? Osteoporosis   ?? Uterine prolaps   ? ? ?Past Medical History, Surgical history, Social history, and Family history were reviewed and updated as appropriate.  ? ?Please see review of systems for further details on the patient's review from today.  ? ?Objective:  ? ?Physical Exam:  ?LMP  (LMP Unknown)  ? ?Physical Exam ? ?Lab Review:  ?   ?Component Value Date/Time  ? NA 138 06/11/2021 0606  ? K 4.0 06/11/2021 0606  ? CL 107 06/11/2021 0606  ? CO2 26 06/11/2021 0606  ? GLUCOSE 90 06/11/2021 0606  ? BUN 18 06/11/2021 0606  ? CREATININE 0.81 06/11/2021 0606  ? CALCIUM 9.4 06/11/2021 0606  ? PROT 6.5 06/11/2021 0606  ? ALBUMIN 3.3 (L) 06/11/2021 0606  ? AST 17 06/11/2021 0606  ? ALT 14 06/11/2021 0606  ? ALKPHOS 141 (H) 06/11/2021 0606  ? BILITOT 0.2 (L) 06/11/2021 0606  ? GFRNONAA >60 06/11/2021 0606  ? GFRAA >60 07/09/2016 0438  ? ? ?   ?Component Value  Date/Time  ? WBC 5.8 06/11/2021 0606  ? RBC 4.12 06/11/2021 0606  ? HGB 12.5 06/11/2021 0606  ? HCT 39.7 06/11/2021 0606  ? PLT 299 06/11/2021 0606  ? MCV 96.4 06/11/2021 0606  ? MCH 30.3 06/11/2021 0606  ? MCHC 31.5 06/11/2021 0606  ? RDW 13.9 06/11/2021 0606  ? LYMPHSABS 1.9 06/11/2021 0606  ? MONOABS 0.5 06/11/2021 0606  ? EOSABS 0.1 06/11/2021 0606  ? BASOSABS 0.0 06/11/2021 0606  ? ? ?No results found for: POCLITH, LITHIUM  ? ?No results found for: PHENYTOIN, PHENOBARB, VALPROATE, CBMZ  ? ?.res ?Assessment: Plan:   ? ?Plan: ?  ?Continue: ?  ?Spravato 84 administrations weekly ?   ?Patient  advised to contact office with any questions, adverse effects, or acute worsening in signs and symptoms. ? ? ? ? ?Diagnoses and all orders for this visit: ? ?Recurrent major depression resistant to treatment [facility]) ? ?  ? ?Please see After Visit Summary for patient specific instructions. ? ?Future Appointments  ?Date Time Provider [place]  ?03/01/2022  9:45 AM Vedia Pereyra, PT [facility]  ?03/04/2022  9:30 AM Lorina Rabon, [place]  ?03/07/2022  1:00 PM Blanchie Serve, PhD [facility] None  ?03/08/2022  9:45 AM Vedia Pereyra, PT [facility]  ?03/10/2022 10:30 AM Vedia Pereyra, PT [facility]  ?03/11/2022 11:45 AM Lorina Rabon, [facility] Police [facility]  ?05/09/2022 11:00 AM Blanchie Serve, PhD [facility] None  ?06/13/2022 11:00 AM Blanchie Serve, PhD [facility] None  ? ? ?No orders of the defined types were placed in this encounter. ? ? ?------------------------------- ?

## 2022-03-01 ENCOUNTER — Ambulatory Visit (HOSPITAL_BASED_OUTPATIENT_CLINIC_OR_DEPARTMENT_OTHER): Payer: 59 | Admitting: Physical Therapy

## 2022-03-01 ENCOUNTER — Encounter (HOSPITAL_BASED_OUTPATIENT_CLINIC_OR_DEPARTMENT_OTHER): Payer: Self-pay | Admitting: Physical Therapy

## 2022-03-01 DIAGNOSIS — R262 Difficulty in walking, not elsewhere classified: Secondary | ICD-10-CM

## 2022-03-01 DIAGNOSIS — M5459 Other low back pain: Secondary | ICD-10-CM

## 2022-03-01 DIAGNOSIS — M6281 Muscle weakness (generalized): Secondary | ICD-10-CM | POA: Diagnosis not present

## 2022-03-01 DIAGNOSIS — R2689 Other abnormalities of gait and mobility: Secondary | ICD-10-CM

## 2022-03-01 NOTE — Therapy (Signed)
?OUTPATIENT PHYSICAL THERAPY TREATMENT NOTE ? ? ?Patient Name: Casey Diaz ?MRN: [id number] ?DOB:12/14/1965, 56 y.o., female ?Today's Date: 03/01/2022 ? ?PCP: Creola Cornusso, John, MD ?REFERRING PROVIDER: Creola Cornusso, John, MD ? ? PT End of Session - 03/01/22 0950   ? ? Visit Number 14   ? Number of Visits 18   ? Date for PT Re-Evaluation 03/16/22   ? Progress Note Due on Visit 20   ? PT Start Time [phone]   ? PT Stop Time 1030   ? PT Time Calculation (min) 40 min   ? Activity Tolerance Patient tolerated treatment well   ? Behavior [facility] for tasks assessed/performed   ? ?  ?  ? ?  ? ? ? ? ? ?Past Medical History:  ?Diagnosis Date  ? Abnormal Pap smear 2011  ? hpv/mild dysplasia,cin1  ? Anxiety   ? Cerebral palsy (HCC)   ? right arm/leg  ? Cystocele   ? Depression   ? Headache   ? Neuromuscular disorder (HCC)   ? Cerebral Palsy  ? OCD (obsessive compulsive disorder)   ? Osteoporosis   ? Uterine prolaps   ? ?Past Surgical History:  ?Procedure Laterality Date  ? COLPOSCOPY  2011  ? ELBOW SURGERY    ? left elbow-separation of bones -age 56  ? ELBOW SURGERY  2015  ? fell on ice- right elbow fracture  ? FRACTURE SURGERY    ? HAMMER TOE SURGERY  1/13  ? RIGHT SIDE  ? HIP PINNING,CANNULATED Left 07/08/2016  ? Procedure: LEFT CLOSED REDUCTION HIP AND PERCUTANEOUS SCREW;  Surgeon: Durene RomansMatthew Olin, MD;  Location: [facility];  Service: Orthopedics;  Laterality: Left;  ? ORIF HUMERUS FRACTURE Right 06/11/2021  ? Procedure: OPEN REDUCTION INTERNAL FIXATION (ORIF) DISTAL HUMERUS FRACTURE;  Surgeon: Myrene GalasHandy, Michael, MD;  Location: [facility];  Service: Orthopedics;  Laterality: Right;  ? WRIST SURGERY  2005  ? left wrist  ? ?Patient Active Problem List  ? Diagnosis Date Noted  ? 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HCC) 08/03/2018  ? Relationship problem with family member 08/03/2018  ? Femoral neck fracture, left, closed, initial encounter 07/08/2016  ? Severe recurrent major depression without psychotic features  (HCC) 06/03/2015  ?  Class: Chronic  ? Obsessive-compulsive disorder 06/03/2015  ?  Class: Chronic  ? ? ?REFERRING DIAG: M54.51 (ICD-10-CM) - Vertebrogenic low back pain  ? ?THERAPY DIAG:  ?Muscle weakness (generalized) ? ?Difficulty in walking, not elsewhere classified ? ?Other abnormalities of gait and mobility ? ?Arthralgia of lumbar spine ? ?PERTINENT HISTORY: CP ?OPEN REDUCTION INTERNAL FIXATION (ORIF) DISTAL HUMERUS FRACTURE ?IM nail Left hip 2017 ?Osteoporosis ? ?PRECAUTIONS: fall ? ?SUBJECTIVE:  "We did a good amount of exercises last visit, didn't overdo.  Right ankle bothering me today "  ? ?PAIN:  ?Are you having pain? Yes ?NPRS scale: 5/10 current ankle ?Pain location: neck/ back; Rt ankle; bilat hips  ? ?PAIN TYPE: aching, sore ?Pain description: constant  ?Aggravating factors: walking  ?Relieving factors: rest ?   ?PRECAUTIONS: Fall ?  ?WEIGHT BEARING RESTRICTIONS No ?  ?FALLS:  ?Has patient fallen in last 6 months? No, Number of falls: 1 in Aug 2022 ?  ?LIVING ENVIRONMENT: ?Lives with: lives with their family ?Lives in: House/apartment ?Stairs: Yes; Internal: 15 steps; none ?Has following equipment at home: None ?  ?  ?PLOF: Independent with household mobility without device ?  ?PATIENT GOALS to get stronger, more coordinated and better balanced. ?  ?  ?  OBJECTIVE: * Findings taken at [facility] unless otherwise noted.  ?  ?  ?PATIENT SURVEYS:  ?FOTO 66 with goal of 50 ?01/20/22: 53 ?  ?  ?COGNITION: ?         Overall cognitive status: Within functional limits for tasks assessed              ?          ?SENSATION: ?         intact ?  ?MUSCLE LENGTH: ?Hamstrings: Right 70 deg; Left 90 deg ?Thomas test: Right neg deg; Left neg deg ?  ?POSTURE:  ? left shoulder elevation, weightbearing shifted left ?  ?PALPATION: ?Tenderness bilat sub occipital and upper traps ?  ?LUMBARAROM/PROM ?  ?A/PROM A/PROM  ?12/15/2021  ?Flexion Forward reach 4 inches to floor  ?Extension    ?Right lateral flexion    ?Left lateral flexion     ?Right rotation    ?Left rotation    ? (Blank rows = not tested) ?  ?LE AROM/PROM: ?  ?A/PROM Right ?12/15/2021 Left ?12/15/2021 Right ?01/20/22 Left ?01/20/22  ?Hip flexion 90 105    ?Hip extension        ?Hip abduction 25 30 28  35  ?Hip adduction        ?Hip internal rotation        ?Hip external rotation        ?Knee flexion 105 120    ?Knee extension        ?Ankle dorsiflexion 5      ?Ankle plantarflexion 10      ?Ankle inversion        ?Ankle eversion        ? (Blank rows = not tested) ?  ?LE MMT: ?  ?MMT Right ?12/15/2021 Left ?12/15/2021  ?Hip flexion 5 4  ?Hip extension 4 4  ?Hip abduction 5 5  ?Hip adduction 5 5  ?Hip internal rotation      ?Hip external rotation      ?Knee flexion 5 5  ?Knee extension      ?Ankle dorsiflexion      ?Ankle plantarflexion      ?Ankle inversion      ?Ankle eversion      ? (Blank rows = not tested) ?  ?LUMBAR SPECIAL TESTS:  ?Straight leg raise test: Negative, Slump test: Negative, and Thomas test: Negative ?  ?FUNCTIONAL TESTS:  ?5 times sit to stand: 20 ?Timed up and go (TUG): 13 ?Berg Balance Scale: 36/56   ? ?02/01/22:  5x STS  = 12.09 sec (no UE support) ?  ?GAIT: ?Distance walked: 540ft ?Assistive device utilized: None ?Level of assistance: Complete Independence ?Comments: right trendelenburg, left leaning, no heel strike or knee extension right, shortened step length ?  ?  ?  ?  ?TODAY'S TREATMENT  ?Pt seen for aquatic therapy today.  Treatment took place in water 3.25-4.8 ft in depth at the 120f pool. Temp of water was 91?.  Pt entered/exited the pool via stairs (step to pattern) independently with single rail. ? ?Warm up:  ?Multiple widths of walking forward, backward, and sidestepping without UE support ? ?Straddling yellow noodle: ?Cycling 20 quick revolutions then 20 slow x 5 cycles for aerobic capacity ? ? ?   Seated ?Addct sets with buoyancy ball 10s hold x10 ?STS 3 step x10 with addct set holding ball between knees. Cues for positioning of ball. ?STS with  ball press x10 ?STS from 4th step x10 Cues for  immediate standing balance. Pt gains immediate standing balance 10/10 times ?  ?   Standing ?Holding to wall hip flex/abd/external rotation x10 R/L ?Side lunges using 1 foam hand buoy shoulder add/abd x4 widths.  Pt with continued difficulty coordinating. ?Step ups R/L x10 without UE support ea ?UE: pushing buoyancy ball against wall x 15-20 with cues to extend rue as able maintaining abd bracing. ? ?Backward and forward gait for rest and recovery between exercises ? ?Pt requires buoyancy for support and to offload joints with strengthening exercises. Viscosity of the water is needed for resistance of strengthening; water current perturbations provides challenge to standing balance unsupported, requiring increased core activation. ?  ?PATIENT EDUCATION:  ?Education details: Condition management;  progression of exercise ?Person educated: Patient ?Education method: Explanation ?Education comprehension: verbalized understanding ?  ?  ?HOME EXERCISE PROGRAM: ?Access Code: [email or web address] ?URL: [email or web address] ?Date: 12/15/2021 ?Prepared by: Geni Bers ?  ?Exercises ?Supine Lower Trunk Rotation - 1 x daily - 7 x weekly - 3 sets - 10 reps ?Supine Single Knee to Chest Stretch - 1 x daily - 7 x weekly - 3 sets - 10 reps ?  ?ASSESSMENT: ?  ?CLINICAL IMPRESSION: ?Good response from last visit.  Pt demonstrating improving strength in core with toleration of added STS with 11.5 buoyancy ball press. She continues to have pain throughout joints although overall pain has decreased.  She reports compliance with HEP coming to [place] and using pool throughout week.  She will continue to benefit from skilled therapy to reach ongoing goals. ? ? ? ? ?OBJECTIVE IMPAIRMENTS Abnormal gait, decreased activity tolerance, decreased balance, decreased coordination, decreased mobility, difficulty walking, decreased strength, and pain.  ?  ?ACTIVITY LIMITATIONS cleaning,  community activity, yard work, and shopping.  ?  ?PERSONAL FACTORS Time since onset of injury/illness/exacerbation and 3+ comorbidities:    are also affecting patient's functional outcome.  ?  ?  ?REHAB POTENT

## 2022-03-03 ENCOUNTER — Other Ambulatory Visit: Payer: 59

## 2022-03-04 ENCOUNTER — Encounter (HOSPITAL_BASED_OUTPATIENT_CLINIC_OR_DEPARTMENT_OTHER): Payer: Self-pay | Admitting: Physical Therapy

## 2022-03-04 ENCOUNTER — Ambulatory Visit (HOSPITAL_BASED_OUTPATIENT_CLINIC_OR_DEPARTMENT_OTHER): Payer: 59 | Admitting: Physical Therapy

## 2022-03-04 DIAGNOSIS — R2689 Other abnormalities of gait and mobility: Secondary | ICD-10-CM

## 2022-03-04 DIAGNOSIS — M6281 Muscle weakness (generalized): Secondary | ICD-10-CM | POA: Diagnosis not present

## 2022-03-04 DIAGNOSIS — M5459 Other low back pain: Secondary | ICD-10-CM

## 2022-03-04 DIAGNOSIS — R262 Difficulty in walking, not elsewhere classified: Secondary | ICD-10-CM

## 2022-03-04 NOTE — Therapy (Signed)
OUTPATIENT PHYSICAL THERAPY TREATMENT NOTE   Patient Name: Casey Diaz MRN: [id number] DOB:10-16-1966, 56 y.o., female Today's Date: 03/04/2022  PCP: Shon Baton, MD REFERRING PROVIDER: Shon Baton, MD   PT End of Session - 03/04/22 [phone]     Visit Number 15    Number of Visits 18    Date for PT Re-Evaluation 03/16/22    Progress Note Due on Visit 20    PT Start Time 0933    PT Stop Time 0277    PT Time Calculation (min) 41 min    Activity Tolerance Patient tolerated treatment well    Behavior During Therapy [facility] for tasks assessed/performed                Past Medical History:  Diagnosis Date   Abnormal Pap smear 2011   hpv/mild dysplasia,cin1   Anxiety    Cerebral palsy ([place])    right arm/leg   Cystocele    Depression    Headache    Neuromuscular disorder (HCC)    Cerebral Palsy   OCD (obsessive compulsive disorder)    Osteoporosis    Uterine prolaps    Past Surgical History:  Procedure Laterality Date   COLPOSCOPY  2011   ELBOW SURGERY     left elbow-separation of bones -age 41   ELBOW SURGERY  2015   fell on ice- right elbow fracture   FRACTURE SURGERY     HAMMER TOE SURGERY  1/13   RIGHT SIDE   HIP PINNING,CANNULATED Left 07/08/2016   Procedure: LEFT CLOSED REDUCTION HIP AND PERCUTANEOUS SCREW;  Surgeon: Paralee Cancel, MD;  Location: [facility];  Service: Orthopedics;  Laterality: Left;   ORIF HUMERUS FRACTURE Right 06/11/2021   Procedure: OPEN REDUCTION INTERNAL FIXATION (ORIF) DISTAL HUMERUS FRACTURE;  Surgeon: Altamese [place], MD;  Location: [place];  Service: Orthopedics;  Laterality: Right;   WRIST SURGERY  2005   left wrist   Patient Active Problem List   Diagnosis Date Noted   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[place]) 08/03/2018   Relationship problem with family member 08/03/2018   Femoral neck fracture, left, closed, initial encounter 07/08/2016   Severe recurrent major depression without psychotic features  ([place]) 06/03/2015    Class: Chronic   Obsessive-compulsive disorder 06/03/2015    Class: Chronic    REFERRING DIAG: M54.51 (ICD-10-CM) - Vertebrogenic low back pain   THERAPY DIAG:  Muscle weakness (generalized)  Difficulty in walking, not elsewhere classified  Other abnormalities of gait and mobility  Arthralgia of lumbar spine  PERTINENT HISTORY: CP OPEN REDUCTION INTERNAL FIXATION (ORIF) DISTAL HUMERUS FRACTURE IM nail Left hip 2017 Osteoporosis  PRECAUTIONS: fall  SUBJECTIVE:  "She about killed me the other day.  I was sore 2 days later.    Right ankle bothering me since yesterday. I think it may be weather related "   PAIN:  Are you having pain? Yes NPRS scale: 5/10 current ankle Pain location:  Rt ankle;  PAIN TYPE: aching, sore Pain description: constant  Aggravating factors: walking  Relieving factors: rest    PRECAUTIONS: Fall   WEIGHT BEARING RESTRICTIONS No   FALLS:  Has patient fallen in last 6 months? No, Number of falls: 1 in Aug 2022   LIVING ENVIRONMENT: Lives with: lives with their family Lives in: House/apartment Stairs: Yes; Internal: 15 steps; none Has following equipment at home: None     PLOF: Independent with household mobility without device   PATIENT GOALS to get  stronger, more coordinated and better balanced.     OBJECTIVE: * Findings taken at [facility] unless otherwise noted.      PATIENT SURVEYS:  FOTO 44 with goal of 50 01/20/22: 53     COGNITION:          Overall cognitive status: Within functional limits for tasks assessed                        SENSATION:          intact   MUSCLE LENGTH: Hamstrings: Right 70 deg; Left 90 deg Thomas test: Right neg deg; Left neg deg   POSTURE:   left shoulder elevation, weightbearing shifted left   PALPATION: Tenderness bilat sub occipital and upper traps   LUMBARAROM/PROM   A/PROM A/PROM  12/15/2021  Flexion Forward reach 4 inches to floor  Extension    Right lateral flexion     Left lateral flexion    Right rotation    Left rotation     (Blank rows = not tested)   LE AROM/PROM:   A/PROM Right 12/15/2021 Left 12/15/2021 Right 01/20/22 Left 01/20/22  Hip flexion 90 105    Hip extension        Hip abduction 25 30 28  35  Hip adduction        Hip internal rotation        Hip external rotation        Knee flexion 105 120    Knee extension        Ankle dorsiflexion 5      Ankle plantarflexion 10      Ankle inversion        Ankle eversion         (Blank rows = not tested)   LE MMT:   MMT Right 12/15/2021 Left 12/15/2021  Hip flexion 5 4  Hip extension 4 4  Hip abduction 5 5  Hip adduction 5 5  Hip internal rotation      Hip external rotation      Knee flexion 5 5  Knee extension      Ankle dorsiflexion      Ankle plantarflexion      Ankle inversion      Ankle eversion       (Blank rows = not tested)   LUMBAR SPECIAL TESTS:  Straight leg raise test: Negative, Slump test: Negative, and Thomas test: Negative   FUNCTIONAL TESTS:  5 times sit to stand: 20 Timed up and go (TUG): 13 Berg Balance Scale: 36/56    02/01/22:  5x STS  = 12.09 sec (no UE support)   GAIT: Distance walked: 518ft Assistive device utilized: None Level of assistance: Complete Independence Comments: right trendelenburg, left leaning, no heel strike or knee extension right, shortened step length         TODAY'S TREATMENT  Pt seen for aquatic therapy today.  Treatment took place in water 3.25-4.8 ft in depth at the 120f pool. Temp of water was 91.  Pt entered/exited the pool via stairs (step to pattern) independently with single rail.  Warm up:  Multiple widths of walking forward, backward, and sidestepping without UE support  Straddling yellow noodle: Cycling 20 quick revolutions then 20 slow x 5 cycles for aerobic capacity     Seated on 4th step from bottom: Addct sets with buoyancy ball 10s hold x10 STS  x10 Cues for immediate standing balance. (Pt  gains immediate standing balance 10/10  times) STS  x10 with addct set holding yellow ball between knees. Cues for positioning of ball. Lap each of forward/backward walking for rest and recovery      Standing Toe taps to 1st, 2nd step and return to neutral, no UE support x ~8 each leg. Mild LOB with fatigue. Lap each of forward/backward walking for rest and recovery  With foot on 2nd step -  5# in/out from core x 5 reps, 2 sets each leg forward Holding to wall: hip flex/abd/external rotation x10 R/L Step ups R/L x10 without UE support each LE UE: pushing buoyancy ball against wall x 20 with cues to use/extend RUE as able maintaining abd bracing.  Backward and forward gait for rest and recovery between exercises  Pt requires buoyancy for support and to offload joints with strengthening exercises. Viscosity of the water is needed for resistance of strengthening; water current perturbations provides challenge to standing balance unsupported, requiring increased core activation.   PATIENT EDUCATION:  Education details: Condition management;  progression of exercise Person educated: Patient Education method: Explanation Education comprehension: verbalized understanding     HOME EXERCISE PROGRAM: Access Code: [facility] URL: [email or web address] Date: 12/15/2021 Prepared by: Denton Meek   Exercises Supine Lower Trunk Rotation - 1 x daily - 7 x weekly - 3 sets - 10 reps Supine Single Knee to Chest Stretch - 1 x daily - 7 x weekly - 3 sets - 10 reps   ASSESSMENT:   CLINICAL IMPRESSION: Unable to tolerate STS with weighted ball between legs, but was able to complete set of 10 with non-weighted yellow ball. Alternating toe taps to 2nd step was good challenge; fatigues with this exercise after 5 reps each leg. She reported increased LBP after weight ball pushes against wall; reduced with rest and walking afterwards.  She will continue to benefit from skilled therapy to reach  ongoing goals.   OBJECTIVE IMPAIRMENTS Abnormal gait, decreased activity tolerance, decreased balance, decreased coordination, decreased mobility, difficulty walking, decreased strength, and pain.    ACTIVITY LIMITATIONS cleaning, community activity, yard work, and shopping.    PERSONAL FACTORS Time since onset of injury/illness/exacerbation and 3+ comorbidities:    are also affecting patient's functional outcome.      REHAB POTENTIAL: Good   CLINICAL DECISION MAKING: Evolving/moderate complexity   EVALUATION COMPLEXITY: Moderate     GOALS: Goals reviewed with patient? Yes   SHORT TERM GOALS:   STG Name Target Date Goal status  1 Pt will tolerate entire sessions of aquatic therapy without increase in cervical or LBP Baseline:  01/05/2022  Achieved 01/20/22  2 Pt will improve bilat hip abduction by 5d Baseline: R 25d; L30d 01/05/2022  Partially met 01/20/22  3 Pt will improve bilat hip flex to 110d Baseline:R 90d; L 105d 01/05/2022  Ongoing 01/20/22    LONG TERM GOALS:    LTG Name Target Date Goal status  1 Pt will improve on FOTO  to 50 to demonstrate improved functional status Baseline:44 01/26/2022  Achieved 01/20/22  2 Pt will improve on Berg Balance Test to >or= 46/56 to demonstrate improvement in balance and decreased fall risk Baseline:36/56 03/03/2022  Ongoing   3 Pt will improve on 5 X STS test to <or= 15 to demonstrate improvement in LE strength Baseline:12.09 seconds 02/01/22 03/03/2022 Achieved 02/01/22    4 Pt will decrease overall pain in cervical spine and LB to 4/10 or< for improved toleration to activity Baseline: 03/03/2022  Ongoing  PLAN: PT FREQUENCY: 1-2x/week   PT DURATION: 6 weeks   PLANNED INTERVENTIONS: Therapeutic exercises, Therapeutic activity, Neuromuscular re-education, Balance training, Gait training, Patient/Family education, Joint manipulation, Joint mobilization, Stair training, Aquatic Therapy, Taping, and Manual therapy    PLAN FOR NEXT SESSION: Aquatics with balance, Stretching LB, hip adductors, flex/ext; Core strengthening, Cervical spine ROM/stretching   Kerin Perna, PTA 03/04/22 10:55 AM

## 2022-03-07 ENCOUNTER — Ambulatory Visit: Payer: 59

## 2022-03-07 ENCOUNTER — Ambulatory Visit (INDEPENDENT_AMBULATORY_CARE_PROVIDER_SITE_OTHER): Payer: 59 | Admitting: Behavioral Health

## 2022-03-07 ENCOUNTER — Ambulatory Visit (INDEPENDENT_AMBULATORY_CARE_PROVIDER_SITE_OTHER): Payer: Self-pay | Admitting: Psychiatry

## 2022-03-07 VITALS — BP 119/84 | HR 89

## 2022-03-07 DIAGNOSIS — F339 Major depressive disorder, recurrent, unspecified: Secondary | ICD-10-CM

## 2022-03-07 DIAGNOSIS — F422 Mixed obsessional thoughts and acts: Secondary | ICD-10-CM

## 2022-03-07 NOTE — Progress Notes (Signed)
No-show/Short-notice cancellation note [name], PhD, [facility]  Patient ID: Casey Diaz     MRN: [id number]     Date: 03/07/2022     Appt time: 1pm  Did not show for scheduled appt, no call/message received by 2pm.  Charge normally, RS as able.  Robley Fries, PhD [name], PhD LP Clinical Psychologist, [facility] [address] [place], [place] [address] [phone]

## 2022-03-07 NOTE — Progress Notes (Signed)
Casey Diaz [id number] 1966-08-31 56 y.o.  Subjective:   Patient ID:  Casey Diaz is a 56 y.o. (DOB [birth year]) female.  Chief Complaint: No chief complaint on file.   HPI   Casey Diaz presents today for Sprivato  treatment today.    Patient was administered Spravato 84 mg intranasally today. She was observed by provider throughout [facility] treatment. The patient again, experienced significant dissociation - out of body experience - "nothing concerning to me",  which gradually resolved over the 2-hour period of observation. There were no complications. Specifically , the patient did not have any prolonged side effects - feeling disconnected from herself, her thoughts, feelings and things around her, dizziness, nausea, feeling sleepy, decreased feeling of sensitivity (numbness)spinning sensation, feeling anxious, lack of energy, increased blood pressure, nausea, vomiting, feeling drunk, or feeling happy or very excited, nausea or vomiting or headache. Blood pressures remained within normal ranges at the 40-minute and 2-hour follow-up intervals. By the time the 2-hour observation period was met the patient was alert and oriented and able to exit without assistance. 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 See nursing note for further details.       ECT-MADRS    [place] Office Visit from 06/29/2021 in [facility]  MADRS Total Score 36      Flowsheet Row Admission (Discharged) from 06/11/2021 in [place] No Risk        Review of Systems:  Review of Systems  Medications: I have reviewed the patient's current medications.  Current Outpatient Medications  Medication Sig Dispense Refill   Abaloparatide (TYMLOS) 3120 MCG/1.56ML SOPN Inject into the skin.     Azelastine-Fluticasone 137-50 MCG/ACT SUSP Place 1-2 sprays into  both nostrils daily.     baclofen (LIORESAL) 10 MG tablet Take 20 mg by mouth at bedtime as needed for muscle spasms.     buPROPion (WELLBUTRIN XL) 150 MG 24 hr tablet TAKE 3 TABLETS BY MOUTH DAILY. 270 tablet 0   buPROPion (WELLBUTRIN XL) 300 MG 24 hr tablet TAKE 1 TABLET BY MOUTH EVERY DAY 90 tablet 0   dicyclomine (BENTYL) 10 MG capsule Take 10 mg by mouth daily.     docusate sodium (COLACE) 100 MG capsule Take 1 capsule (100 mg total) by mouth 2 (two) times daily. (Patient taking differently: Take 100 mg by mouth daily.) 10 capsule 0   Esketamine HCl, 84 MG Dose, (SPRAVATO, 84 MG DOSE,) 28 MG/DEVICE SOPK USE NASALLY AS DIRECTED ON [facility] LABEL TWICE A WEEK 3 each 5   fexofenadine (ALLEGRA) 180 MG tablet Take 180 mg by mouth daily.     fluvoxaMINE (LUVOX) 100 MG tablet Take 3 tablets (300 mg total) by mouth at bedtime. 270 tablet 0   fluvoxaMINE (LUVOX) 100 MG tablet Take 1 tablet (100 mg total) by mouth at bedtime. 90 tablet 0   hydrocortisone (ANUSOL-HC) 2.5 % rectal cream Place rectally 2 (two) times daily. x 7-14 days 30 g 0   ketotifen (ZADITOR) 0.025 % ophthalmic solution Place 3 drops into both eyes at bedtime.     LORazepam (ATIVAN) 1 MG tablet 2 in the AM and HS and 1 tablet in afternoon 150 tablet 3   magnesium gluconate (MAGONATE) 500 MG tablet Take 500 mg by mouth daily.     MIBELAS 24 FE 1-20 MG-MCG(24) CHEW Chew 1 tablet by mouth at bedtime as needed (  bowel regularity).     Multiple Vitamins-Minerals (ADULT GUMMY PO) Take 2 tablets by mouth in the morning.     nitrofurantoin (MACRODANTIN) 100 MG capsule Take 100 mg by mouth as needed (For urinary tract infection.).      oxyCODONE-acetaminophen (PERCOCET/ROXICET) 5-325 MG tablet Take 1-2 tablets by mouth every 6 (six) hours as needed for severe pain. 50 tablet 0   polyethylene glycol (MIRALAX / GLYCOLAX) packet Take 17 g by mouth daily as needed for mild constipation. 14 each 0   psyllium (METAMUCIL) 58.6 % powder Take 1 packet by  mouth daily as needed (constipation).     temazepam (RESTORIL) 30 MG capsule Take 1 capsule (30 mg total) by mouth at bedtime as needed for sleep. 30 capsule 5   Vitamin D-Vitamin K (VITAMIN K2-VITAMIN D3 PO) Take 1-2 sprays by mouth daily.     No current facility-administered medications for this visit.    Medication Side Effects: None  Allergies:  Allergies  Allergen Reactions   Hydrocodone Itching   Sulfamethoxazole-Trimethoprim Itching   Dust Mite Extract Other (See Comments)    Sneezing, watery eyes, runny nose   Latex Itching   Other Other (See Comments)    PT IS ALLERGIC TO CAT DANDER AND RAGWEED - Sneezing, watery eyes, runny nose    Pollen Extract Other (See Comments)    Sneezing, watery eyes, runny nose     Past Medical History:  Diagnosis Date   Abnormal Pap smear 2011   hpv/mild dysplasia,cin1   Anxiety    Cerebral palsy (HCC)    right arm/leg   Cystocele    Depression    Headache    Neuromuscular disorder (HCC)    Cerebral Palsy   OCD (obsessive compulsive disorder)    Osteoporosis    Uterine prolaps     Past Medical History, Surgical history, Social history, and Family history were reviewed and updated as appropriate.   Please see review of systems for further details on the patient's review from today.   Objective:   Physical Exam:  LMP  (LMP Unknown)   Physical Exam  Lab Review:     Component Value Date/Time   NA 138 06/11/2021 0606   K 4.0 06/11/2021 0606   CL 107 06/11/2021 0606   CO2 26 06/11/2021 0606   GLUCOSE 90 06/11/2021 0606   BUN 18 06/11/2021 0606   CREATININE 0.81 06/11/2021 0606   CALCIUM 9.4 06/11/2021 0606   PROT 6.5 06/11/2021 0606   ALBUMIN 3.3 (L) 06/11/2021 0606   AST 17 06/11/2021 0606   ALT 14 06/11/2021 0606   ALKPHOS 141 (H) 06/11/2021 0606   BILITOT 0.2 (L) 06/11/2021 0606   GFRNONAA >60 06/11/2021 0606   GFRAA >60 07/09/2016 0438       Component Value Date/Time   WBC 5.8 06/11/2021 0606   RBC 4.12  06/11/2021 0606   HGB 12.5 06/11/2021 0606   HCT 39.7 06/11/2021 0606   PLT 299 06/11/2021 0606   MCV 96.4 06/11/2021 0606   MCH 30.3 06/11/2021 0606   MCHC 31.5 06/11/2021 0606   RDW 13.9 06/11/2021 0606   LYMPHSABS 1.9 06/11/2021 0606   MONOABS 0.5 06/11/2021 0606   EOSABS 0.1 06/11/2021 0606   BASOSABS 0.0 06/11/2021 0606    No results found for: POCLITH, LITHIUM   No results found for: PHENYTOIN, PHENOBARB, VALPROATE, CBMZ   .res Assessment: Plan:    Plan:   Continue:   Spravato 84 administrations weekly    Patient advised  to contact office with any questions, adverse effects, or acute worsening in signs and symptoms.    Aaron Edelman A. Niclas Markell, NP    There are no diagnoses linked to this encounter.   Please see After Visit Summary for patient specific instructions.  Future Appointments  Date Time Provider [place]  03/08/2022  9:45 AM Vedia Pereyra, PT [facility]  03/09/2022  1:30 PM Little, [facility]  03/10/2022 10:30 AM Vedia Pereyra, PT [facility]  05/09/2022 11:00 AM Blanchie Serve, PhD [facility] None  06/13/2022 11:00 AM Blanchie Serve, PhD [facility] None    No orders of the defined types were placed in this encounter.   -------------------------------

## 2022-03-07 NOTE — Progress Notes (Signed)
NURSE Visit:   Pt arrived for her weekly Spravato Treatment, she is going on her 27th dose, she continues with 84 mg (3 of the 28 mg) Spravato nasal spray. 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 Her B/P at 3:40 PM 115/69, 91, Pt instructed to blow her nose and to recline back at 45 degrees. Pt given first nasal spray (28 mg) administered by pt observed by nurse. There were 5 minutes between each dose, total of 84 mg. Tolerated well. Pt's medication is delivered by [facility] in [place] and stored inside a safe behind a locked door as well. Spravato is a CIII medication and has to be only given at a treatment facility and observed by nurse as pt administered intranasally.  Pt's 40 minute vital signs at 4:25 PM 91/70, 83. Lesle Chris, NP came to discuss her care at the end of her treatment when her thoughts are clearer. She voiced no complaints, no headaches, nausea, vomiting, mood better today. She does go to the bathroom at least once during her treatment. No sedation and had slight feeling of being "high" she reports.  Discharge vitals at 5:35 PM 127/69, 82. Pt stable for discharge, pt is scheduled next Monday, May 22nd. Pt was observed on site a total of 120 minutes per FDA/REMS requirements. Pt was with nurse for clinical assessment a total of 55 minutes.    LOT [id number] EXP 2025 FEB

## 2022-03-08 ENCOUNTER — Ambulatory Visit (HOSPITAL_BASED_OUTPATIENT_CLINIC_OR_DEPARTMENT_OTHER): Payer: 59 | Admitting: Physical Therapy

## 2022-03-09 ENCOUNTER — Ambulatory Visit: Payer: 59

## 2022-03-09 ENCOUNTER — Encounter: Payer: Self-pay | Admitting: Psychiatry

## 2022-03-09 DIAGNOSIS — R2681 Unsteadiness on feet: Secondary | ICD-10-CM

## 2022-03-09 DIAGNOSIS — G809 Cerebral palsy, unspecified: Secondary | ICD-10-CM

## 2022-03-09 DIAGNOSIS — M7671 Peroneal tendinitis, right leg: Secondary | ICD-10-CM

## 2022-03-09 NOTE — Progress Notes (Signed)
Casey Diaz [id number] 09-Jan-1966 56 y.o.    Subjective:   Patient ID:  Casey Diaz is a 56 y.o. (DOB Dec 07, 1965) female.  Chief Complaint:  Chief Complaint  Patient presents with   Follow-up   Depression   Anxiety   Fatigue     HPI Casey Diaz presents to the office today for follow-up of OCD and severe anxiety.     December 2019 visit the following was noted: No meds were changed. Lives in [place] and back for followup.  Sx are about the same.  Has to take meds with different sizes. Pt reports that mood is Anxious and Depressed and describes anxiety as Severe. Anxiety symptoms include: Excessive Worry, Obsessive Compulsive Symptoms:   Checking,,. Pt reports has interrupted sleep and nocturia. Pt reports that appetite is good. Pt reports that energy is no change and down slightly. Concentration is down slightly. Suicidal thoughts:  denied by patient. Loves the environment of [place] but misses some things there.  She's not able to work there.  H works there and likes it.  Struggled with not working, feels isolated and not up to task of meeting people.  Does attend a church and met a friend who's been helpful.  Leaving for [place] on 10/16/18.   04/09/2020 appointment the following is noted:  Staying another year in [place] bc Covid and other things. Last few months a lot of crying spells.  Is in menopause. Wonders about med changes though is nervous about it.  Crying spells associated with depressing thoughts more than stress or OCD.   Covid really hard on everyone and couldn't see family for 18 mos.  Family still very dysfunctional. No close friends in part due to OCD and depression. Son high Autism spectrum with ADHD and anxiety and she's with him all the time. Greater health problems with CP so more pains.   05/15/20 appt with the following noted: [name] for menopause and helps some. Still depressed.  Chronically. In [place] for 2 more weeks then to [place] for  another year. A lot of stressors lately triggering more checking and anxiety.   OCD is her CC now and seems.  Got worse DT stress.   Stressed with Asberger's son and her health.  H works a lot.  Her FOO still stress. Plan: Trintellix 10 mg 1 tablet in the morning with food and reduce fluvoxamine to 5 tablets nightly for 1 week  then reduce it to 4 tablets nightly.   07/02/20 appt with the following noted: Decided not to get Trintellix bc difficulty getting it. It is available.  There.  Wants to start it now.   Both depression and OCD are severe.  Not suicidal in intent or plan. Did not take samples with her to [place] but will be back in December. covid is worse there and travel is difficult.  Wants to reduce Wellbutrin DT dry mouth. Plan: She's afraid to reduce Luvox at this time DT fear of worsening OCD.  But will consider. Trintellix 10 mg 1 tablet in the morning with food and reduce fluvoxamine to 5 tablets nightly for 1 week  then reduce it to 4 tablets nightly. Also reduce Wellbutrin XL to 300 mg daily.    9-13 2022 appointment with the following noted: Back in [place] since July 14.  Broke arm a month ago and surgery.  It's all been rough adjustment.   B has cancer on his face and M fell taking him to the doctor.  Misses the water and weather of [place].   Cry a lot more since menopause. Still depression and anxiety and OCD.  Asks about ketamine. On Wellbutrin 300, Luvox 300.  No Trintellix. Added Ativan 2 mg AM and HS and it helps.  More likely to get upset at night. Plan: Increase Luvox back to 400 mg daily.  She thinks she's worse on less. Continue Wellbutrin XL to 300 mg daily. 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  She tolerated it well without unusual nausea or vomiting headache or other somatic symptoms.  She did have the expected dissociation which gradually resolved over  the course of the 2-hour period of observation.  She was a little concerned about her balance given her cerebral palsy but has not noted unusual or unexpected problems.  She is motivated to can continue Spravato in hopes of reducing her depressive symptoms. 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  She also has treatment resistant OCD which is partially managed with medications but is still quite disabling.  She is tolerating the medications well.  She is sleeping adequately.  Her appetite is adequate.  She is not having suicidal thoughts.  She continues to wish for a better treatment for OCD that would give her some relief.  09/30/2021 appointment with the following noted: She received her first dose of Spravato 84 mg intranasally today.  She tolerated it well without unusual nausea, vomiting, or other somatic symptoms.  Dissociation as expected did occur and gradually resolved over the 2-hour period of observation.  She did have a mild headache today with the treatment and received ibuprofen 600 mg at her request.  We will follow this to see if it is a pattern Patient is still depressed.  She said she was late with her medicine today and today was a particularly depressing day.  However she notes that the Spravato has lifted her mood considerably even today.  She is hopeful that it will continue to be helpful.  No suicidal thoughts.  She has ongoing chronic anxiety and OCD at baseline.  10/04/21 appt noted: Patient received Spravato 84 mg for the second time today.  She tolerated it well without any unusual headache, nausea or vomiting or other somatic symptoms.  Dissociation did occur and she gradually [place] resolution over the 2-hour period of observation. She did not have any unusual problems after she left the office last Spravato administration.  She did not have any specific problems with balance or walking.  She is at increased risk of that difficulty because of  cerebral palsy.  So far she has not noticed much mood effect from the medication beyond the first day of receiving it.  However she would like to continue Spravato in hopes of getting the antidepressant effect that is desired. Stress dealing with mother's behavior at party pt hosted.  Guilt over it.  10/07/2021 appointment noted: Patient received Spravato 84 mg for the second time today.  She tolerated it well without any unusual headache, nausea or vomiting or other somatic symptoms.  Dissociation did occur and she gradually [place] resolution over the 2-hour period of observation. She still is not sure about the antidepressant effect of Spravato.  Events over the holidays and demands, make it difficult to assess.  She still notes that the OCD tends to worsen the depression and vice versa.  She tolerates the  Spravato well and wants to continue the trial.  10/15/2021 appointment with the following noted: Patient received Spravato 84 mg for the second time today.  She tolerated it well without any unusual headache, nausea or vomiting or other somatic symptoms.  Dissociation did occur and she gradually [place] resolution over the 2-hour period of observation. Patient says it was somewhat difficult to evaluate the effect of the Spravato.  It was scheduled to be twice weekly for 4 weeks consecutively but the holidays have interfered with that administration.  She asked what specifically should be she should be looking for in order to assess improvement.  That was discussed.  The OCD is unchanged and the depression so far is not significantly different.  She still tolerates meds.  There have been no recent med changes  10/19/2021 appt noted: Patient received Spravato 84 mg for the second today.  She tolerated it well without any unusual headache, nausea or vomiting or other somatic symptoms.  Dissociation did occur and she gradually saw resolution over the 2-hour period of observation.   10/21/2021 appointment  noted: Patient received Spravato 84 mg today.  She tolerated it well without any unusual headache, nausea or vomiting or other somatic symptoms.  Dissociation did occur and she gradually saw resolution over the 2-hour period of observation.  She feels better than last week.  She is not as depressed and down.  She is still dealing with grief around the death of her cousin that was unexpected.  It is still difficult to tell how much the Spravato was doing but she is hopeful.  Anxiety is still present with the OCD.  She is not having suicidal thoughts.  She is not hopeless.  She wants to continue treatment.  10/25/2021 appointment with the following noted: Patient received Spravato 84 mg today.  She tolerated it well without any unusual headache, nausea or vomiting or other somatic symptoms.  Dissociation did occur and she gradually saw resolution over the 2-hour period of observation.  She does not typically find the dissociation very strong. She is beginning to think the Spravato is helping somewhat with the depression.  It has been difficult to tell with the holidays intervening as well as the death of her cousin.  She has not been able to get Spravato twice weekly for 4 weeks straight as typically planned.  However she is hopeful.  The OCD remains significant.  She still has a tendency to think very negatively.  She is not suicidal.  10/28/2021 appointment with the following noted: Patient received Spravato 84 mg today.  She tolerated it well without any unusual headache, nausea or vomiting or other somatic symptoms.  Dissociation did occur and she gradually saw resolution over the 2-hour period of observation.  She does not typically find the dissociation very strong. She is feeling more hopeful about the administration of Spravato.  She is having less depression she believes.  Still not dramatically different.  She still has a tendency to have a lot of anxiety and rumination and OCD.  She is not suicidal.   She is eager to continue the Spravato.  11/01/2021 appointment with the following noted: Patient received Spravato 84 mg today.  She tolerated it well without any unusual headache, nausea or vomiting or other somatic symptoms.  Dissociation did occur and she gradually saw resolution over the 2-hour period of observation.  She does not typically find the dissociation very strong. She is continuing to see a little bit of improvement in depression  with Spravato.  The anxiety remains but may be not as severe.  The OCD remains markedly severe chronically.  She is not suicidal.  She is encouraged by the degree of improvement with Spravato and inability to enjoy things more and not be quite as ruminative.  11/04/2021 appt noted: Patient received Spravato 84 mg today.  She tolerated it well without any unusual headache, nausea or vomiting or other somatic symptoms.  Dissociation did occur and she gradually saw resolution over the 2-hour period of observation.  She does not typically find the dissociation very strong. No SE complaints with meds. She continues to feel hopeful about the Spravato.  She has less depression.  Because of a number of factors she is uncertain of the full benefit but thinks she is somewhat less depressed.  Her anxiety and OCD remain significant but a little better.  She is tolerating the medications and does not desire medicine change.  She is not currently complaining of insomnia.   11/08/2021 appointment the following noted: Patient received Spravato 84 mg today.  She tolerated it well without any unusual headache, nausea or vomiting or other somatic symptoms.  Dissociation did occur and she gradually saw resolution over the 2-hour period of observation.  She does not typically find the dissociation very strong. No SE complaints with meds. She feels the Spravato is helping somewhat.  She would like to see a greater effect.  However she is able to enjoy things.  She is productive at home.   She would like to see a lifting of a degree of sadness that remains.  The anxiety and OCD remained largely unchanged.  She wondered about the dosing of Wellbutrin 300 mg a day and Luvox 300 mg a day and possible increases.  She has been at higher doses in the past.  She plans to start water therapy for her weakness and for her shoulder.  11/11/2021 appointment with the following noted: Patient received Spravato 84 mg today.  She tolerated it well without any unusual headache, nausea or vomiting or other somatic symptoms.  Dissociation did occur and she gradually saw resolution over the 2-hour period of observation.  She does not typically find the dissociation very strong. No SE complaints with meds. She feels the Spravato is clearly helping the depression.  She would like to see a more significant effect.  She is still having trouble thinking positive. Her energy is fair.  Concentration is good except for the problem with chronic obsessions. She has been taking Wellbutrin 300 mg in Luvox 300 mg for quite some time but has taken higher doses in the past.  We discussed that.  She would like to try higher doses in order to get a better effect if possible. We just increased the doses a couple of days ago.  No effect yet.  11/15/2021 appointment with the following noted: Patient received Spravato 84 mg today.  She tolerated it well without any unusual headache, nausea or vomiting or other somatic symptoms.  Dissociation did occur and she gradually saw resolution over the 2-hour period of observation.  She does not typically find the dissociation very strong. No SE complaints with meds. The patient is now convinced that the Spravato is helping the depression.  She would like to continue twice weekly Spravato this week if possible.  She has tolerated the increase in Wellbutrin to 450 mg daily and the increase and fluvoxamine to 400 mg daily without complications thus far.  The OCD and anxiety feed  the  depression to some extent. She spends approximately 2 hours daily with checking compulsions due to obsessions about causing harm to others.  For example fearing that when she has hit a pot hole that she may have hit a person and going back to check.  Checking corners and rooms out of fear that she may have harmed someone.  Other various checking compulsions.  She is hoping the increase in fluvoxamine to 400 mg will reduce that over the weeks to come.  She is not seeing a significant difference with the addition of the Spravato though she understands that was not expected.  She is more productive at home and more motivated and able to enjoy things more fully as a result of the Spravato treatment.  She is tolerating the medication  11/18/2021 appointment with the following noted: Patient received Spravato 84 mg today.  She tolerated it well without any unusual headache, nausea or vomiting or other somatic symptoms.  Dissociation did occur and she gradually saw resolution over the 2-hour period of observation.  She does not typically find the dissociation very strong. No SE complaints with meds. She clearly believes the Spravato has been helpful for the depression.  She wonders whether to continue to treatments weekly or to cut back to 1 weekly.  She would like to continue twice weekly in hopes of getting additional improvement in the depression because it is not resolved but it is difficult to get here twice a week in terms of arranging rides. She is recently increased Wellbutrin XL to 450 mg daily and fluvoxamine to 400 mg daily but they have not had time to have an official effect.  She is tolerating that well.  She is tolerating meds overwork overall well. The OCD remains the same as noted on 11/15/2021  11/25/21 appt noted: Patient received Spravato 84 mg today.  She tolerated it well without any unusual headache, nausea or vomiting or other somatic symptoms.  Dissociation did occur and she gradually saw  resolution over the 2-hour period of observation.  She does not typically find the dissociation very strong. No SE complaints with meds. She thinks the increase in Wellbutrin and Luvox have been potentially helpful for depression and OCD respectively.  It has been too early to see the full effect.  She is sleeping and eating well.  She is functioning at home.  She still spends a lot of time that is about 2 hours a day dealing with compulsive behaviors.  12/02/21 appt noted: Patient received Spravato 84 mg today.  She tolerated it well without any unusual headache, nausea or vomiting or other somatic symptoms.  Dissociation did occur and she gradually saw resolution over the 2-hour period of observation.  She does not typically find the dissociation very strong. No SE complaints with meds. Several losses and stressors recently that affect her sense of mood. However still sees significant benefit from the Spravato for her depression.  Wants to continue it. Suspect early  some benefit from the increased Wellbutrin for depression and Luvox for OCD. Tolerating meds. No complaints about the meds. Sleeping and eating well.  No new health concerns.  12/09/21 appt noted: Patient received Spravato 84 mg today.  She tolerated it well without any unusual headache, nausea or vomiting or other somatic symptoms.  Dissociation did occur and she gradually saw resolution over the 2-hour period of observation.  She does not typically find the dissociation very strong. No SE complaints with meds. Seeing noticeable improvement from  increase fluvoxamine to 400 mg daily.  Tolerating meds without concerns over them. Depression is stable with residual sx of easy guilt and easily stressed.  OCD contributes to depression but depression is not severe with less crying spells.  Productive at home with chores.  Enjoyed recent birthday.  Sleeping good. No new concerns.  12/23/2021 appointment noted: Patient received Spravato 84 mg  today.  She tolerated it well without any unusual headache, nausea or vomiting or other somatic symptoms.  Dissociation did occur and she gradually saw resolution over the 2-hour period of observation.  She does not typically find the dissociation very strong. No SE complaints with meds. Seeing noticeable improvement from increase fluvoxamine to 400 mg daily.  Tolerating meds without concerns over them. Her depression is somewhat improved with the Spravato.  She also feels generally a little lighter.  She is more motivated.  She is less overwhelmed by guilt.  The OCD is gradually improving but is still quite time-consuming as noted before.  She is sleeping well.  No side effects  12/30/2021 appointment with the following noted: Patient received Spravato 84 mg today.  She tolerated it well without any unusual headache, nausea or vomiting or other somatic symptoms.  Dissociation did occur and she gradually saw resolution over the 2-hour period of observation.  She does not typically find the dissociation very strong. No SE complaints with meds. Seeing noticeable improvement from increase fluvoxamine to 400 mg daily.  Tolerating meds without concerns over them. She is confident of her the improvement seen with Spravato.  She is less hopeless.  Guilt is marked remarkably improved.  She is not having any thoughts of death or dying.  She is more motivated for activities such as exercise which she is recently started.  She is sleeping well. The OCD remains severe but it is improving somewhat with the increase in fluvoxamine.  It is still consuming a couple hours per day.  01/10/22 apravato 84 admin  01/24/22 appt noted: Patient received Spravato 84 mg today.  She tolerated it well without any unusual headache, nausea or vomiting or other somatic symptoms.  Dissociation did occur and she gradually saw resolution over the 2-hour period of observation.  She does not typically find the dissociation very strong. No  SE complaints with meds. Very tearful today.  Feels like she has been suppressing emotion in the Spravato caused it to be released.  Discussed some stressors.  Overall still feels the medicine is helpful.  She has missed some of the scheduled Spravato treatments that were intended to be weekly due to circumstances beyond her control.  She is still struggling with OCD as previously noted but does believe the medications are helpful. Plan no med changes  01/31/2022 received Spravato 84 mg today  02/09/2022 appointment with the following noted: Patient received Spravato 84 mg today.  She tolerated it well without any unusual headache, nausea or vomiting or other somatic symptoms.  Dissociation did occur and she gradually saw resolution over the 2-hour period of observation.  She does not typically find the dissociation very strong. No SE complaints with meds. Spravato clearly helps depression and OCD but easily gets overwhelmed and tearful with fairly routine stressors.  Tolerating meds. Sleep and appetite is OK Asks to increase lorazepam to 2 mg AM and HS and 67m afternoon  02/16/22 appt noted: Patient received Spravato 84 mg today.  She tolerated it well without any unusual headache, nausea or vomiting or other somatic symptoms.  Dissociation did  occur and she gradually saw resolution over the 2-hour period of observation.  She does not typically find the dissociation very strong. No SE complaints with meds. She has chronic depesssion and OCD but is improved with Spravato, both dx versus before.  She has continued Luvox 400 mg and Wellbutrin 450 mg and is tolerating it.  Chronically easily stressed.  Tolerating all meds.  Doesn't like taking more meds.  Spending a couple hours daily with OCD.  No SI No med changes.  02/21/22 appt noted:   Doesn't like taking more meds.  Spending a couple hours daily with OCD.  No SI No med changes.  02/21/22 appt noted: Patient received Spravato 84 mg today.  She  tolerated it well without any unusual headache, nausea or vomiting or other somatic symptoms.  Dissociation did occur and she gradually saw resolution over the 2-hour period of observation.  She does not typically find the dissociation very strong. No SE complaints with meds. She has chronic depesssion and OCD but is improved with Spravato, both dx versus before.  She has continued Luvox 400 mg and Wellbutrin 450 mg and is tolerating it.  Chronically easily overwhelmed and doesn't know why.  Tolerating all meds. Wants to continue meds.  Previous psych med trials include Prozac, paroxetine, sertraline, fluvoxamine, venlafaxine, Anafranil with no response,  Wellbutrin, , Viibryd, Trintellix 10 1 month NR Geodon,  risperidone, Rexulti, Abilify,  Seroquel, Latuda 40 mg with irritability.  lamotrigine lithium,  BuSpar, Namenda,  pramipexole with no response, and Topamax, pindolol  ECT-MADRS    [place] Office Visit from 06/29/2021 in [facility] Total Score 36      Flowsheet Row Admission (Discharged) from 06/11/2021 in [facility] No Risk        Review of Systems:  Review of Systems  Constitutional:  Positive for fatigue.  Musculoskeletal:  Positive for arthralgias, back pain and gait problem. Negative for joint swelling.  Neurological:  Positive for weakness.  Psychiatric/Behavioral:  Positive for dysphoric mood. Negative for suicidal ideas. The patient is nervous/anxious.    Medications: I have reviewed the patient's current medications.  Current Outpatient Medications  Medication Sig Dispense Refill   Abaloparatide (TYMLOS) 3120 MCG/1.56ML SOPN Inject into the skin.     Azelastine-Fluticasone 137-50 MCG/ACT SUSP Place 1-2 sprays into both nostrils daily.     baclofen (LIORESAL) 10 MG tablet Take 20 mg by mouth at bedtime as needed for muscle spasms.     buPROPion (WELLBUTRIN XL) 150 MG 24 hr tablet TAKE 3 TABLETS  BY MOUTH DAILY. 270 tablet 0   buPROPion (WELLBUTRIN XL) 300 MG 24 hr tablet TAKE 1 TABLET BY MOUTH EVERY DAY 90 tablet 0   dicyclomine (BENTYL) 10 MG capsule Take 10 mg by mouth daily.     docusate sodium (COLACE) 100 MG capsule Take 1 capsule (100 mg total) by mouth 2 (two) times daily. (Patient taking differently: Take 100 mg by mouth daily.) 10 capsule 0   Esketamine HCl, 84 MG Dose, (SPRAVATO, 84 MG DOSE,) 28 MG/DEVICE SOPK USE NASALLY AS DIRECTED ON [facility] LABEL TWICE A WEEK 3 each 5   fexofenadine (ALLEGRA) 180 MG tablet Take 180 mg by mouth daily.     fluvoxaMINE (LUVOX) 100 MG tablet Take 3 tablets (300 mg total) by mouth at bedtime. 270 tablet 0   fluvoxaMINE (LUVOX) 100 MG tablet Take 1 tablet (100 mg total) by mouth at bedtime. 90 tablet 0  hydrocortisone (ANUSOL-HC) 2.5 % rectal cream Place rectally 2 (two) times daily. x 7-14 days 30 g 0   ketotifen (ZADITOR) 0.025 % ophthalmic solution Place 3 drops into both eyes at bedtime.     LORazepam (ATIVAN) 1 MG tablet 2 in the AM and HS and 1 tablet in afternoon 150 tablet 3   magnesium gluconate (MAGONATE) 500 MG tablet Take 500 mg by mouth daily.     MIBELAS 24 FE 1-20 MG-MCG(24) CHEW Chew 1 tablet by mouth at bedtime as needed (bowel regularity).     Multiple Vitamins-Minerals (ADULT GUMMY PO) Take 2 tablets by mouth in the morning.     nitrofurantoin (MACRODANTIN) 100 MG capsule Take 100 mg by mouth as needed (For urinary tract infection.).      oxyCODONE-acetaminophen (PERCOCET/ROXICET) 5-325 MG tablet Take 1-2 tablets by mouth every 6 (six) hours as needed for severe pain. 50 tablet 0   polyethylene glycol (MIRALAX / GLYCOLAX) packet Take 17 g by mouth daily as needed for mild constipation. 14 each 0   psyllium (METAMUCIL) 58.6 % powder Take 1 packet by mouth daily as needed (constipation).     temazepam (RESTORIL) 30 MG capsule Take 1 capsule (30 mg total) by mouth at bedtime as needed for sleep. 30 capsule 5   Vitamin D-Vitamin K  (VITAMIN K2-VITAMIN D3 PO) Take 1-2 sprays by mouth daily.     No current facility-administered medications for this visit.    Medication Side Effects: None   Allergies:  Allergies  Allergen Reactions   Hydrocodone Itching   Sulfamethoxazole-Trimethoprim Itching   Dust Mite Extract Other (See Comments)    Sneezing, watery eyes, runny nose   Latex Itching   Other Other (See Comments)    PT IS ALLERGIC TO CAT DANDER AND RAGWEED - Sneezing, watery eyes, runny nose    Pollen Extract Other (See Comments)    Sneezing, watery eyes, runny nose     Past Medical History:  Diagnosis Date   Abnormal Pap smear 2011   hpv/mild dysplasia,cin1   Anxiety    Cerebral palsy (HCC)    right arm/leg   Cystocele    Depression    Headache    Neuromuscular disorder (HCC)    Cerebral Palsy   OCD (obsessive compulsive disorder)    Osteoporosis    Uterine prolaps     Family History  Problem Relation Age of Onset   Cancer Father        skin AND LUNG   Alcohol abuse Sister        CRACK COCAINE    Social History   Socioeconomic History   Marital status: Married    Spouse name: Not on file   Number of children: Not on file   Years of education: Not on file   Highest education level: Not on file  Occupational History   Not on file  Tobacco Use   Smoking status: Never   Smokeless tobacco: Never  Substance and Sexual Activity   Alcohol use: Not Currently    Comment: OCCASIONAL beer   Drug use: No   Sexual activity: Yes    Birth control/protection: Pill    Comment: LOESTRIN 24 FE  Other Topics Concern   Not on file  Social History Narrative   Not on file   Social Determinants of Health   Financial Resource Strain: Not on file  Food Insecurity: Not on file  Transportation Needs: Not on file  Physical Activity: Not on file  Stress: Not  on file  Social Connections: Not on file  Intimate Partner Violence: Not on file    Past Medical History, Surgical history, Social  history, and Family history were reviewed and updated as appropriate.   Please see review of systems for further details on the patient's review from today.   Objective:   Physical Exam:  LMP  (LMP Unknown)   Physical Exam Neurological:     Mental Status: She is alert and oriented to person, place, and time.     Cranial Nerves: No dysarthria.     Motor: Weakness present.     Gait: Gait abnormal.  Psychiatric:        Attention and Perception: Attention and perception normal.        Mood and Affect: Mood is anxious and depressed. Affect is not labile or tearful.        Speech: Speech normal. Speech is not slurred.        Behavior: Behavior normal. Behavior is cooperative.        Thought Content: Thought content normal. Thought content is not delusional. Thought content does not include homicidal or suicidal ideation. Thought content does not include suicidal plan.        Cognition and Memory: Cognition and memory normal. Cognition is not impaired.        Judgment: Judgment normal.     Comments: Insight intact Pleasant. Ongoing OCD remains fairly severe but less anxious Checking compulsions  2 hours daily but improving noticeably Chronic depression persistent but better with Spravato     Lab Review:     Component Value Date/Time   NA 138 06/11/2021 0606   K 4.0 06/11/2021 0606   CL 107 06/11/2021 0606   CO2 26 06/11/2021 0606   GLUCOSE 90 06/11/2021 0606   BUN 18 06/11/2021 0606   CREATININE 0.81 06/11/2021 0606   CALCIUM 9.4 06/11/2021 0606   PROT 6.5 06/11/2021 0606   ALBUMIN 3.3 (L) 06/11/2021 0606   AST 17 06/11/2021 0606   ALT 14 06/11/2021 0606   ALKPHOS 141 (H) 06/11/2021 0606   BILITOT 0.2 (L) 06/11/2021 0606   GFRNONAA >60 06/11/2021 0606   GFRAA >60 07/09/2016 0438       Component Value Date/Time   WBC 5.8 06/11/2021 0606   RBC 4.12 06/11/2021 0606   HGB 12.5 06/11/2021 0606   HCT 39.7 06/11/2021 0606   PLT 299 06/11/2021 0606   MCV 96.4 06/11/2021  0606   MCH 30.3 06/11/2021 0606   MCHC 31.5 06/11/2021 0606   RDW 13.9 06/11/2021 0606   LYMPHSABS 1.9 06/11/2021 0606   MONOABS 0.5 06/11/2021 0606   EOSABS 0.1 06/11/2021 0606   BASOSABS 0.0 06/11/2021 0606    No results found for: POCLITH, LITHIUM   No results found for: PHENYTOIN, PHENOBARB, VALPROATE, CBMZ   .res Assessment: Plan:    Ylianna was seen today for follow-up, depression, anxiety and fatigue.  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  Impaired function but less so with Spravato re: depression..   She is receiving Spravato 84 mg weekly and marked improvement in the depression..  she feels it also helps OCD somewhat.  However still easily overwhelmed with low stress tolerance.  The OCD is improving some with the increase in fluvoxamine.  Spends 2 hours daily and checking compulsions.  She has been on higher doses of fluvoxamine above the usual max of 400 mg daily in the past.  This became difficult to obtain at 1 point and the dose was reduced to 300 mg daily.   Disc SE. She would like to continue Luvox 400 mg daily again to see if her OCD can be better controlled   She is tolerating the meds well  Increased Luvox back to 400 mg nightly as of the third week of January 2023. Consider reevaluating dose.  Disc dosing.  Sehe feels this is starting to help more with OCD which remains chronically severe. Increased Wellbutrin XL back to 450 mg every morning for depression as of the third week of January 2023  Disc Spravato DT TRD incl details and SE. Disc dosing and duration.  Pt with severe depression MADRS 36 on 06/29/21 Patient was administered Spravato 84 mg intranasally dosage today.  The patient experienced the typical dissociation which gradually  resolved over the 2-hour period of observation.  There were no complications.  Specifically the patient did not have nausea or vomiting or headache.  Blood pressures remained within normal ranges at the 40-minute and 2-hour follow-up intervals.  By the time the 2-hour observation period was met the patient was alert and oriented and able to exit without assistance. She tends to have lingering sedative effects but not severe. .  See nursing note for further details. Per protocol will continue Spravato to 84 mg next session.  She has been okay since cutting back to once weekly.  We discussed the short-term risks associated with benzodiazepines including sedation and increased fall risk among others.  Discussed long-term side effect risk including dependence, potential withdrawal symptoms, and the potential eventual dose-related risk of dementia.  But recent studies from 2020 dispute this association between benzodiazepines and dementia risk. Newer studies in 2020 do not support an association with dementia. For TR anxiety lorazepam to 7m AM and HS and 1 mg in afternoon.  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  Supportive therapy dealing with some of the recent stressors including family stressors and her own health related to cerebral palsy   Follow-up weekly .  Consider twice weekly.   CLynder Parents MD, DFAPA  Please see After Visit Summary for patient specific instructions.  Future Appointments  Date Time Provider [place] 05/09/2022 11:00 AM MBlanchie Serve PhD CP-CP None  06/13/2022 11:00 AM MBlanchie Serve PhD CP-CP None    No orders of the defined types were placed in this encounter.    -------------------------------

## 2022-03-09 NOTE — Progress Notes (Signed)
SITUATION: Reason for Visit: Fitting and Delivery of Prefab Carbon Spiral AFO and custom foot orthotics Patient Report: Patient reports comfort and is satisfied with devices.  OBJECTIVE DATA: Patient History / Diagnosis:    ICD-10-CM   1. 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HCC)  G80.9     2. Gait instability  R26.81     3. Peroneal tendinitis of right lower extremity  M76.71       Provided Device: Rene Paci 38S50 R39-42 and [employer] insoles [id number]  GOAL OF ORTHOSIS - Improve gait - Decrease energy expenditure - Improve Balance - Compensate for muscle weakness - Facilitate motion  ACTIONS PERFORMED Patient was fit with [place] WalkOn Spiral AFO trimmed to shoe last. Patient tolerated fittign procedure. Device was modified as follows to better fit patient: - Toe plate was trimmed to shoe last - Strap was trimmed to appropriate length  Patient was provided with verbal and written instruction and demonstration regarding donning, doffing, wear, care, proper fit, function, purpose, cleaning, and use of the orthosis and in all related precautions and risks and benefits regarding the orthosis.  Patient was also provided with verbal instruction regarding how to report any failures or malfunctions of the orthosis and necessary follow up care. Patient was also instructed to contact our office regarding any change in status that may affect the function of the orthosis.  Patient demonstrated independence with proper donning, doffing, and fit and verbalized understanding of all instructions.  PLAN: Patient is to follow up in one week or as necessary (PRN). All questions were answered and concerns addressed. Plan of care was discussed with and agreed upon by the patient.

## 2022-03-10 ENCOUNTER — Ambulatory Visit (HOSPITAL_BASED_OUTPATIENT_CLINIC_OR_DEPARTMENT_OTHER): Payer: 59 | Admitting: Physical Therapy

## 2022-03-11 ENCOUNTER — Ambulatory Visit (HOSPITAL_BASED_OUTPATIENT_CLINIC_OR_DEPARTMENT_OTHER): Payer: Self-pay | Admitting: Physical Therapy

## 2022-03-11 NOTE — Progress Notes (Signed)
NURSE Visit:   Pt arrived for her weekly Spravato Treatment, she is going on her 27th dose, she continues with 84 mg (3 of the 28 mg) Spravato nasal spray. 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 Her B/P at 3:20 PM 114/79, 90, Pt instructed to blow her nose and to recline back at 45 degrees. Pt given first nasal spray (28 mg) administered by pt observed by nurse. There were 5 minutes between each dose, total of 84 mg. Tolerated well. Pt's medication is delivered by [facility] in [place] and stored inside a safe behind a locked door as well. Spravato is a CIII medication and has to be only given at a treatment facility and observed by nurse as pt administered intranasally.  Pt's 40 minute vital signs at 4:11 PM 129/81, 87. Lesle Chris, NP met with pt to discuss her care at the end of her treatment when her thoughts are clearer. She voiced no complaints, no headaches, nausea, vomiting, mood better today. She does go to the bathroom at least once during her treatment. No sedation and had slight feeling of being "high" she reports.  Discharge vitals at 5:22 PM 119/84, 89. Pt stable for discharge, pt is scheduled next Wednesday, May 31st. Pt was observed on site a total of 120 minutes per FDA/REMS requirements. Pt was with nurse for clinical assessment a total of 55 minutes.    LOT [id number] EXP 2025 FEB

## 2022-03-16 ENCOUNTER — Ambulatory Visit (INDEPENDENT_AMBULATORY_CARE_PROVIDER_SITE_OTHER): Payer: 59 | Admitting: Psychiatry

## 2022-03-16 ENCOUNTER — Ambulatory Visit: Payer: 59

## 2022-03-16 ENCOUNTER — Encounter (HOSPITAL_BASED_OUTPATIENT_CLINIC_OR_DEPARTMENT_OTHER): Payer: Self-pay | Admitting: Physical Therapy

## 2022-03-16 ENCOUNTER — Ambulatory Visit (HOSPITAL_BASED_OUTPATIENT_CLINIC_OR_DEPARTMENT_OTHER): Payer: 59 | Admitting: Physical Therapy

## 2022-03-16 VITALS — BP 132/82 | HR 86

## 2022-03-16 DIAGNOSIS — R2689 Other abnormalities of gait and mobility: Secondary | ICD-10-CM

## 2022-03-16 DIAGNOSIS — M5459 Other low back pain: Secondary | ICD-10-CM

## 2022-03-16 DIAGNOSIS — M6281 Muscle weakness (generalized): Secondary | ICD-10-CM | POA: Diagnosis not present

## 2022-03-16 DIAGNOSIS — F422 Mixed obsessional thoughts and acts: Secondary | ICD-10-CM | POA: Diagnosis not present

## 2022-03-16 DIAGNOSIS — G809 Cerebral palsy, unspecified: Secondary | ICD-10-CM

## 2022-03-16 DIAGNOSIS — F339 Major depressive disorder, recurrent, unspecified: Secondary | ICD-10-CM

## 2022-03-16 DIAGNOSIS — F5105 Insomnia due to other mental disorder: Secondary | ICD-10-CM | POA: Diagnosis not present

## 2022-03-16 DIAGNOSIS — R262 Difficulty in walking, not elsewhere classified: Secondary | ICD-10-CM

## 2022-03-16 DIAGNOSIS — F4010 Social phobia, unspecified: Secondary | ICD-10-CM

## 2022-03-16 NOTE — Therapy (Signed)
OUTPATIENT PHYSICAL THERAPY TREATMENT NOTE   Patient Name: Casey Diaz MRN: [id number] DOB:Sep 01, 1966, 56 y.o., female Today's Date: 03/16/2022  PCP: Shon Baton, MD REFERRING PROVIDER: Shon Baton, MD   PT End of Session - 03/16/22 1056     Visit Number 16    Number of Visits 18    Date for PT Re-Evaluation 03/16/22    Progress Note Due on Visit --    PT Start Time 1100    PT Stop Time 1141    PT Time Calculation (min) 41 min    Activity Tolerance Patient tolerated treatment well    Behavior During Therapy [facility] for tasks assessed/performed                Past Medical History:  Diagnosis Date   Abnormal Pap smear 2011   hpv/mild dysplasia,cin1   Anxiety    Cerebral palsy ([place])    right arm/leg   Cystocele    Depression    Headache    Neuromuscular disorder (HCC)    Cerebral Palsy   OCD (obsessive compulsive disorder)    Osteoporosis    Uterine prolaps    Past Surgical History:  Procedure Laterality Date   COLPOSCOPY  2011   ELBOW SURGERY     left elbow-separation of bones -age 61   ELBOW SURGERY  2015   fell on ice- right elbow fracture   FRACTURE SURGERY     HAMMER TOE SURGERY  1/13   RIGHT SIDE   HIP PINNING,CANNULATED Left 07/08/2016   Procedure: LEFT CLOSED REDUCTION HIP AND PERCUTANEOUS SCREW;  Surgeon: Paralee Cancel, MD;  Location: [facility];  Service: Orthopedics;  Laterality: Left;   ORIF HUMERUS FRACTURE Right 06/11/2021   Procedure: OPEN REDUCTION INTERNAL FIXATION (ORIF) DISTAL HUMERUS FRACTURE;  Surgeon: Altamese Old Ripley, MD;  Location: [place];  Service: Orthopedics;  Laterality: Right;   WRIST SURGERY  2005   left wrist   Patient Active Problem List   Diagnosis Date Noted   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[place]) 08/03/2018   Relationship problem with family member 08/03/2018   Femoral neck fracture, left, closed, initial encounter 07/08/2016   Severe recurrent major depression without psychotic features  ([place]) 06/03/2015    Class: Chronic   Obsessive-compulsive disorder 06/03/2015    Class: Chronic    REFERRING DIAG: M54.51 (ICD-10-CM) - Vertebrogenic low back pain   THERAPY DIAG:  Muscle weakness (generalized)  Difficulty in walking, not elsewhere classified  Other abnormalities of gait and mobility  Arthralgia of lumbar spine  PERTINENT HISTORY: CP OPEN REDUCTION INTERNAL FIXATION (ORIF) DISTAL HUMERUS FRACTURE IM nail Left hip 2017 Osteoporosis  PRECAUTIONS: fall  SUBJECTIVE:  Pt reports she has a lot on her mind.  She is interested in continuation of therapy, but at a decreased frequency.  She reports she has noticed improvement since starting therapy  PAIN:  Are you having pain? Yes NPRS scale: 2/10 current ankle, 4-5/10 in low back and neck Pain location:  Rt ankle; bilat lumbar, back of head  PAIN TYPE: aching, sore Pain description: constant  Aggravating factors: walking  Relieving factors: rest    PRECAUTIONS: Fall   WEIGHT BEARING RESTRICTIONS No   FALLS:  Has patient fallen in last 6 months? No, Number of falls: 1 in Aug 2022   LIVING ENVIRONMENT: Lives with: lives with their family Lives in: House/apartment Stairs: Yes; Internal: 15 steps; none Has following equipment at home: None     PLOF: Independent  with household mobility without device   PATIENT GOALS to get stronger, more coordinated and better balanced.     OBJECTIVE: * Findings taken at [facility] unless otherwise noted.      PATIENT SURVEYS:  FOTO 44 with goal of 50 01/20/22: 53     COGNITION:          Overall cognitive status: Within functional limits for tasks assessed                        SENSATION:          intact   MUSCLE LENGTH: Hamstrings: Right 70 deg; Left 90 deg Thomas test: Right neg deg; Left neg deg   POSTURE:   left shoulder elevation, weightbearing shifted left   PALPATION: Tenderness bilat sub occipital and upper traps   LUMBARAROM/PROM   A/PROM A/PROM   12/15/2021  Flexion Forward reach 4 inches to floor  Extension    Right lateral flexion    Left lateral flexion    Right rotation    Left rotation     (Blank rows = not tested)   LE AROM/PROM:   A/PROM Right 12/15/2021 Left 12/15/2021 Right 01/20/22 Left 01/20/22  Hip flexion 90 105    Hip extension        Hip abduction 25 30 28  35  Hip adduction        Hip internal rotation        Hip external rotation        Knee flexion 105 120    Knee extension        Ankle dorsiflexion 5      Ankle plantarflexion 10      Ankle inversion        Ankle eversion         (Blank rows = not tested)   LE MMT:   MMT Right 12/15/2021 Left 12/15/2021  Hip flexion 5 4  Hip extension 4 4  Hip abduction 5 5  Hip adduction 5 5  Hip internal rotation      Hip external rotation      Knee flexion 5 5  Knee extension      Ankle dorsiflexion      Ankle plantarflexion      Ankle inversion      Ankle eversion       (Blank rows = not tested)   LUMBAR SPECIAL TESTS:  Straight leg raise test: Negative, Slump test: Negative, and Thomas test: Negative   FUNCTIONAL TESTS:  5 times sit to stand: 20 Timed up and go (TUG): 13 Berg Balance Scale: 36/56    02/01/22:  5x STS  = 12.09 sec (no UE support)   GAIT: Distance walked: 574ft Assistive device utilized: None Level of assistance: Complete Independence Comments: right trendelenburg, left leaning, no heel strike or knee extension right, shortened step length         TODAY'S TREATMENT  Pt seen for aquatic therapy today.  Treatment took place in water 3.25-4.8 ft in depth at the 120f pool. Temp of water was 91.  Pt entered/exited the pool via stairs (step to pattern) independently with single rail.  Warm up:  Multiple widths of walking forward, backward, and sidestepping without UE support  Standing: Toe taps to 1st, 2nd step and return to neutral, no UE support x ~10each leg. Mild LOB with fatigue. TUG like exercise x 4 reps  (from bench in water), working on even step height with  increased speed With foot on 2nd step -  3.5# in/out from core x 10 reps, 1 set each leg forward In 4 ft water: wood chop with small yellow ball in wide staggered stance x 5 x 2 sets each leg forward each direction Hollding yellow noodle: stork stance with hip ER/IR  Seated on yellow noodle: Cycling -varied speed / scissors/ CC ski for aerobic capacity  Seated on 4th step from bottom: STS with orange weight ball between knees x10 Cues for immediate standing balance. (Pt gains immediate standing balance 10/10 times)  Throwing orange weighted ball at wall x 8 Lap each of forward/backward walking for rest and recovery  Pt requires buoyancy for support and to offload joints with strengthening exercises. Viscosity of the water is needed for resistance of strengthening; water current perturbations provides challenge to standing balance unsupported, requiring increased core activation.   PATIENT EDUCATION:  Education details: Condition management;  progression of exercise Person educated: Patient Education method: Explanation Education comprehension: verbalized understanding     HOME EXERCISE PROGRAM: Access Code: [facility] URL: [email or web address] Date: 12/15/2021 Prepared by: Denton Meek   Exercises Supine Lower Trunk Rotation - 1 x daily - 7 x weekly - 3 sets - 10 reps Supine Single Knee to Chest Stretch - 1 x daily - 7 x weekly - 3 sets - 10 reps   ASSESSMENT:   CLINICAL IMPRESSION: Pt was able to tolerate 10 reps of STS with weighted ball squeeze, but reported soreness in adductors at end of session.  She was able to complete SLS exercises with improved balance.  No increase in pain during session.  She will continue to benefit from skilled therapy to reach ongoing goals.   OBJECTIVE IMPAIRMENTS Abnormal gait, decreased activity tolerance, decreased balance, decreased coordination, decreased mobility,  difficulty walking, decreased strength, and pain.    ACTIVITY LIMITATIONS cleaning, community activity, yard work, and shopping.    PERSONAL FACTORS Time since onset of injury/illness/exacerbation and 3+ comorbidities:    are also affecting patient's functional outcome.      REHAB POTENTIAL: Good   CLINICAL DECISION MAKING: Evolving/moderate complexity   EVALUATION COMPLEXITY: Moderate     GOALS: Goals reviewed with patient? Yes   SHORT TERM GOALS:   STG Name Target Date Goal status  1 Pt will tolerate entire sessions of aquatic therapy without increase in cervical or LBP Baseline:  01/05/2022  Achieved 01/20/22  2 Pt will improve bilat hip abduction by 5d Baseline: R 25d; L30d 01/05/2022  Partially met 01/20/22  3 Pt will improve bilat hip flex to 110d Baseline:R 90d; L 105d 01/05/2022  Ongoing 01/20/22    LONG TERM GOALS:    LTG Name Target Date Goal status  1 Pt will improve on FOTO  to 50 to demonstrate improved functional status Baseline:44 01/26/2022  Achieved 01/20/22  2 Pt will improve on Berg Balance Test to >or= 46/56 to demonstrate improvement in balance and decreased fall risk Baseline:36/56 03/03/2022  Ongoing   3 Pt will improve on 5 X STS test to <or= 15 to demonstrate improvement in LE strength Baseline:12.09 seconds 02/01/22 03/03/2022 Achieved 02/01/22    4 Pt will decrease overall pain in cervical spine and LB to 4/10 or< for improved toleration to activity Baseline: 03/03/2022  Ongoing              PLAN: PT FREQUENCY: 1-2x/week   PT DURATION: 6 weeks   PLANNED INTERVENTIONS: Therapeutic exercises, Therapeutic activity, Neuromuscular re-education, Balance training, Gait training, Patient/Family education,  Joint manipulation, Joint mobilization, Stair training, Aquatic Therapy, Taping, and Manual therapy   PLAN FOR NEXT SESSION: BERG and measure Rt hip abdct ROM STG#2; Assess goals; recert;  issue laminated HEP in upcoming visit.    Kerin Perna, PTA 03/16/22 12:38 PM

## 2022-03-17 ENCOUNTER — Encounter: Payer: Self-pay | Admitting: Psychiatry

## 2022-03-17 ENCOUNTER — Other Ambulatory Visit: Payer: Self-pay | Admitting: Psychiatry

## 2022-03-17 NOTE — Progress Notes (Unsigned)
Casey Diaz [id number] 04/10/1966 56 y.o.    Subjective:   Patient ID:  Casey Diaz is a 56 y.o. (DOB [birth year]) female.  Chief Complaint:  Chief Complaint  Patient presents with   Follow-up   Depression   Anxiety     HPI Casey Diaz presents to the office today for follow-up of OCD and severe anxiety.     December 2019 visit the following was noted: No meds were changed. Lives in [place] and back for followup.  Sx are about the same.  Has to take meds with different sizes. Pt reports that mood is Anxious and Depressed and describes anxiety as Severe. Anxiety symptoms include: Excessive Worry, Obsessive Compulsive Symptoms:   Checking,,. Pt reports has interrupted sleep and nocturia. Pt reports that appetite is good. Pt reports that energy is no change and down slightly. Concentration is down slightly. Suicidal thoughts:  denied by patient. Loves the environment of [place] but misses some things there.  She's not able to work there.  H works there and likes it.  Struggled with not working, feels isolated and not up to task of meeting people.  Does attend a church and met a friend who's been helpful.  Leaving for [place] on 10/16/18.   04/09/2020 appointment the following is noted:  Staying another year in [place] bc Covid and other things. Last few months a lot of crying spells.  Is in menopause. Wonders about med changes though is nervous about it.  Crying spells associated with depressing thoughts more than stress or OCD.   Covid really hard on everyone and couldn't see family for 18 mos.  Family still very dysfunctional. No close friends in part due to OCD and depression. Son high Autism spectrum with ADHD and anxiety and she's with him all the time. Greater health problems with CP so more pains.   05/15/20 appt with the following noted: Casey Diaz for menopause and helps some. Still depressed.  Chronically. In [place] for 2 more weeks then to [place] for another  year. A lot of stressors lately triggering more checking and anxiety.   OCD is her CC now and seems.  Got worse DT stress.   Stressed with Asberger's son and her health.  H works a lot.  Her FOO still stress. Plan: Trintellix 10 mg 1 tablet in the morning with food and reduce fluvoxamine to 5 tablets nightly for 1 week  then reduce it to 4 tablets nightly.   07/02/20 appt with the following noted: Decided not to get Trintellix bc difficulty getting it. It is available.  There.  Wants to start it now.   Both depression and OCD are severe.  Not suicidal in intent or plan. Did not take samples with her to [place] but will be back in December. covid is worse there and travel is difficult.  Wants to reduce Wellbutrin DT dry mouth. Plan: She's afraid to reduce Luvox at this time DT fear of worsening OCD.  But will consider. Trintellix 10 mg 1 tablet in the morning with food and reduce fluvoxamine to 5 tablets nightly for 1 week  then reduce it to 4 tablets nightly. Also reduce Wellbutrin XL to 300 mg daily.    9-13 2022 appointment with the following noted: Back in [place] since July 14.  Broke arm a month ago and surgery.  It's all been rough adjustment.   B has cancer on his face and M fell taking him to the doctor.  Misses the water  and weather of [place].   Cry a lot more since menopause. Still depression and anxiety and OCD.  Asks about ketamine. On Wellbutrin 300, Luvox 300.  No Trintellix. Added Ativan 2 mg AM and HS and it helps.  More likely to get upset at night. Plan: Increase Luvox back to 400 mg daily.  She thinks she's worse on less. Continue Wellbutrin XL to 300 mg daily. 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  She tolerated it well without unusual nausea or vomiting headache or other somatic symptoms.  She did have the expected dissociation which gradually resolved over the course  of the 2-hour period of observation.  She was a little concerned about her balance given her cerebral palsy but has not noted unusual or unexpected problems.  She is motivated to can continue Spravato in hopes of reducing her depressive symptoms. 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  She also has treatment resistant OCD which is partially managed with medications but is still quite disabling.  She is tolerating the medications well.  She is sleeping adequately.  Her appetite is adequate.  She is not having suicidal thoughts.  She continues to wish for a better treatment for OCD that would give her some relief.  09/30/2021 appointment with the following noted: She received her first dose of Spravato 84 mg intranasally today.  She tolerated it well without unusual nausea, vomiting, or other somatic symptoms.  Dissociation as expected did occur and gradually resolved over the 2-hour period of observation.  She did have a mild headache today with the treatment and received ibuprofen 600 mg at her request.  We will follow this to see if it is a pattern Patient is still depressed.  She said she was late with her medicine today and today was a particularly depressing day.  However she notes that the Spravato has lifted her mood considerably even today.  She is hopeful that it will continue to be helpful.  No suicidal thoughts.  She has ongoing chronic anxiety and OCD at baseline.  10/04/21 appt noted: Patient received Spravato 84 mg for the second time today.  She tolerated it well without any unusual headache, nausea or vomiting or other somatic symptoms.  Dissociation did occur and she gradually Sutton resolution over the 2-hour period of observation. She did not have any unusual problems after she left the office last Spravato administration.  She did not have any specific problems with balance or walking.  She is at increased risk of that difficulty because of cerebral  palsy.  So far she has not noticed much mood effect from the medication beyond the first day of receiving it.  However she would like to continue Spravato in hopes of getting the antidepressant effect that is desired. Stress dealing with mother's behavior at party pt hosted.  Guilt over it.  10/07/2021 appointment noted: Patient received Spravato 84 mg for the second time today.  She tolerated it well without any unusual headache, nausea or vomiting or other somatic symptoms.  Dissociation did occur and she gradually Braden resolution over the 2-hour period of observation. She still is not sure about the antidepressant effect of Spravato.  Events over the holidays and demands, make it difficult to assess.  She still notes that the OCD tends to worsen the depression and vice versa.  She tolerates the Spravato well and  wants to continue the trial.  10/15/2021 appointment with the following noted: Patient received Spravato 84 mg for the second time today.  She tolerated it well without any unusual headache, nausea or vomiting or other somatic symptoms.  Dissociation did occur and she gradually Farner resolution over the 2-hour period of observation. Patient says it was somewhat difficult to evaluate the effect of the Spravato.  It was scheduled to be twice weekly for 4 weeks consecutively but the holidays have interfered with that administration.  She asked what specifically should be she should be looking for in order to assess improvement.  That was discussed.  The OCD is unchanged and the depression so far is not significantly different.  She still tolerates meds.  There have been no recent med changes  10/19/2021 appt noted: Patient received Spravato 84 mg for the second today.  She tolerated it well without any unusual headache, nausea or vomiting or other somatic symptoms.  Dissociation did occur and she gradually saw resolution over the 2-hour period of observation.   10/21/2021 appointment noted: Patient  received Spravato 84 mg today.  She tolerated it well without any unusual headache, nausea or vomiting or other somatic symptoms.  Dissociation did occur and she gradually saw resolution over the 2-hour period of observation.  She feels better than last week.  She is not as depressed and down.  She is still dealing with grief around the death of her cousin that was unexpected.  It is still difficult to tell how much the Spravato was doing but she is hopeful.  Anxiety is still present with the OCD.  She is not having suicidal thoughts.  She is not hopeless.  She wants to continue treatment.  10/25/2021 appointment with the following noted: Patient received Spravato 84 mg today.  She tolerated it well without any unusual headache, nausea or vomiting or other somatic symptoms.  Dissociation did occur and she gradually saw resolution over the 2-hour period of observation.  She does not typically find the dissociation very strong. She is beginning to think the Spravato is helping somewhat with the depression.  It has been difficult to tell with the holidays intervening as well as the death of her cousin.  She has not been able to get Spravato twice weekly for 4 weeks straight as typically planned.  However she is hopeful.  The OCD remains significant.  She still has a tendency to think very negatively.  She is not suicidal.  10/28/2021 appointment with the following noted: Patient received Spravato 84 mg today.  She tolerated it well without any unusual headache, nausea or vomiting or other somatic symptoms.  Dissociation did occur and she gradually saw resolution over the 2-hour period of observation.  She does not typically find the dissociation very strong. She is feeling more hopeful about the administration of Spravato.  She is having less depression she believes.  Still not dramatically different.  She still has a tendency to have a lot of anxiety and rumination and OCD.  She is not suicidal.  She is eager to  continue the Spravato.  11/01/2021 appointment with the following noted: Patient received Spravato 84 mg today.  She tolerated it well without any unusual headache, nausea or vomiting or other somatic symptoms.  Dissociation did occur and she gradually saw resolution over the 2-hour period of observation.  She does not typically find the dissociation very strong. She is continuing to see a little bit of improvement in depression with Spravato.  The anxiety remains but may be not as severe.  The OCD remains markedly severe chronically.  She is not suicidal.  She is encouraged by the degree of improvement with Spravato and inability to enjoy things more and not be quite as ruminative.  11/04/2021 appt noted: Patient received Spravato 84 mg today.  She tolerated it well without any unusual headache, nausea or vomiting or other somatic symptoms.  Dissociation did occur and she gradually saw resolution over the 2-hour period of observation.  She does not typically find the dissociation very strong. No SE complaints with meds. She continues to feel hopeful about the Spravato.  She has less depression.  Because of a number of factors she is uncertain of the full benefit but thinks she is somewhat less depressed.  Her anxiety and OCD remain significant but a little better.  She is tolerating the medications and does not desire medicine change.  She is not currently complaining of insomnia.   11/08/2021 appointment the following noted: Patient received Spravato 84 mg today.  She tolerated it well without any unusual headache, nausea or vomiting or other somatic symptoms.  Dissociation did occur and she gradually saw resolution over the 2-hour period of observation.  She does not typically find the dissociation very strong. No SE complaints with meds. She feels the Spravato is helping somewhat.  She would like to see a greater effect.  However she is able to enjoy things.  She is productive at home.  She would like  to see a lifting of a degree of sadness that remains.  The anxiety and OCD remained largely unchanged.  She wondered about the dosing of Wellbutrin 300 mg a day and Luvox 300 mg a day and possible increases.  She has been at higher doses in the past.  She plans to start water therapy for her weakness and for her shoulder.  11/11/2021 appointment with the following noted: Patient received Spravato 84 mg today.  She tolerated it well without any unusual headache, nausea or vomiting or other somatic symptoms.  Dissociation did occur and she gradually saw resolution over the 2-hour period of observation.  She does not typically find the dissociation very strong. No SE complaints with meds. She feels the Spravato is clearly helping the depression.  She would like to see a more significant effect.  She is still having trouble thinking positive. Her energy is fair.  Concentration is good except for the problem with chronic obsessions. She has been taking Wellbutrin 300 mg in Luvox 300 mg for quite some time but has taken higher doses in the past.  We discussed that.  She would like to try higher doses in order to get a better effect if possible. We just increased the doses a couple of days ago.  No effect yet.  11/15/2021 appointment with the following noted: Patient received Spravato 84 mg today.  She tolerated it well without any unusual headache, nausea or vomiting or other somatic symptoms.  Dissociation did occur and she gradually saw resolution over the 2-hour period of observation.  She does not typically find the dissociation very strong. No SE complaints with meds. The patient is now convinced that the Spravato is helping the depression.  She would like to continue twice weekly Spravato this week if possible.  She has tolerated the increase in Wellbutrin to 450 mg daily and the increase and fluvoxamine to 400 mg daily without complications thus far.  The OCD and anxiety feed the depression to  some  extent. She spends approximately 2 hours daily with checking compulsions due to obsessions about causing harm to others.  For example fearing that when she has hit a pot hole that she may have hit a person and going back to check.  Checking corners and rooms out of fear that she may have harmed someone.  Other various checking compulsions.  She is hoping the increase in fluvoxamine to 400 mg will reduce that over the weeks to come.  She is not seeing a significant difference with the addition of the Spravato though she understands that was not expected.  She is more productive at home and more motivated and able to enjoy things more fully as a result of the Spravato treatment.  She is tolerating the medication  11/18/2021 appointment with the following noted: Patient received Spravato 84 mg today.  She tolerated it well without any unusual headache, nausea or vomiting or other somatic symptoms.  Dissociation did occur and she gradually saw resolution over the 2-hour period of observation.  She does not typically find the dissociation very strong. No SE complaints with meds. She clearly believes the Spravato has been helpful for the depression.  She wonders whether to continue to treatments weekly or to cut back to 1 weekly.  She would like to continue twice weekly in hopes of getting additional improvement in the depression because it is not resolved but it is difficult to get here twice a week in terms of arranging rides. She is recently increased Wellbutrin XL to 450 mg daily and fluvoxamine to 400 mg daily but they have not had time to have an official effect.  She is tolerating that well.  She is tolerating meds overwork overall well. The OCD remains the same as noted on 11/15/2021  11/25/21 appt noted: Patient received Spravato 84 mg today.  She tolerated it well without any unusual headache, nausea or vomiting or other somatic symptoms.  Dissociation did occur and she gradually saw resolution over the  2-hour period of observation.  She does not typically find the dissociation very strong. No SE complaints with meds. She thinks the increase in Wellbutrin and Luvox have been potentially helpful for depression and OCD respectively.  It has been too early to see the full effect.  She is sleeping and eating well.  She is functioning at home.  She still spends a lot of time that is about 2 hours a day dealing with compulsive behaviors.  12/02/21 appt noted: Patient received Spravato 84 mg today.  She tolerated it well without any unusual headache, nausea or vomiting or other somatic symptoms.  Dissociation did occur and she gradually saw resolution over the 2-hour period of observation.  She does not typically find the dissociation very strong. No SE complaints with meds. Several losses and stressors recently that affect her sense of mood. However still sees significant benefit from the Spravato for her depression.  Wants to continue it. Suspect early  some benefit from the increased Wellbutrin for depression and Luvox for OCD. Tolerating meds. No complaints about the meds. Sleeping and eating well.  No new health concerns.  12/09/21 appt noted: Patient received Spravato 84 mg today.  She tolerated it well without any unusual headache, nausea or vomiting or other somatic symptoms.  Dissociation did occur and she gradually saw resolution over the 2-hour period of observation.  She does not typically find the dissociation very strong. No SE complaints with meds. Seeing noticeable improvement from increase fluvoxamine to  400 mg daily.  Tolerating meds without concerns over them. Depression is stable with residual sx of easy guilt and easily stressed.  OCD contributes to depression but depression is not severe with less crying spells.  Productive at home with chores.  Enjoyed recent birthday.  Sleeping good. No new concerns.  12/23/2021 appointment noted: Patient received Spravato 84 mg today.  She  tolerated it well without any unusual headache, nausea or vomiting or other somatic symptoms.  Dissociation did occur and she gradually saw resolution over the 2-hour period of observation.  She does not typically find the dissociation very strong. No SE complaints with meds. Seeing noticeable improvement from increase fluvoxamine to 400 mg daily.  Tolerating meds without concerns over them. Her depression is somewhat improved with the Spravato.  She also feels generally a little lighter.  She is more motivated.  She is less overwhelmed by guilt.  The OCD is gradually improving but is still quite time-consuming as noted before.  She is sleeping well.  No side effects  12/30/2021 appointment with the following noted: Patient received Spravato 84 mg today.  She tolerated it well without any unusual headache, nausea or vomiting or other somatic symptoms.  Dissociation did occur and she gradually saw resolution over the 2-hour period of observation.  She does not typically find the dissociation very strong. No SE complaints with meds. Seeing noticeable improvement from increase fluvoxamine to 400 mg daily.  Tolerating meds without concerns over them. She is confident of her the improvement seen with Spravato.  She is less hopeless.  Guilt is marked remarkably improved.  She is not having any thoughts of death or dying.  She is more motivated for activities such as exercise which she is recently started.  She is sleeping well. The OCD remains severe but it is improving somewhat with the increase in fluvoxamine.  It is still consuming a couple hours per day.  01/10/22 apravato 84 admin  01/24/22 appt noted: Patient received Spravato 84 mg today.  She tolerated it well without any unusual headache, nausea or vomiting or other somatic symptoms.  Dissociation did occur and she gradually saw resolution over the 2-hour period of observation.  She does not typically find the dissociation very strong. No SE  complaints with meds. Very tearful today.  Feels like she has been suppressing emotion in the Spravato caused it to be released.  Discussed some stressors.  Overall still feels the medicine is helpful.  She has missed some of the scheduled Spravato treatments that were intended to be weekly due to circumstances beyond her control.  She is still struggling with OCD as previously noted but does believe the medications are helpful. Plan no med changes  01/31/2022 received Spravato 84 mg today  02/09/2022 appointment with the following noted: Patient received Spravato 84 mg today.  She tolerated it well without any unusual headache, nausea or vomiting or other somatic symptoms.  Dissociation did occur and she gradually saw resolution over the 2-hour period of observation.  She does not typically find the dissociation very strong. No SE complaints with meds. Spravato clearly helps depression and OCD but easily gets overwhelmed and tearful with fairly routine stressors.  Tolerating meds. Sleep and appetite is OK Asks to increase lorazepam to 2 mg AM and HS and 62m afternoon  02/16/22 appt noted: Patient received Spravato 84 mg today.  She tolerated it well without any unusual headache, nausea or vomiting or other somatic symptoms.  Dissociation did occur and she  gradually saw resolution over the 2-hour period of observation.  She does not typically find the dissociation very strong. No SE complaints with meds. She has chronic depesssion and OCD but is improved with Spravato, both dx versus before.  She has continued Luvox 400 mg and Wellbutrin 450 mg and is tolerating it.  Chronically easily stressed.  Tolerating all meds.  Doesn't like taking more meds.  Spending a couple hours daily with OCD.  No SI No med changes.  02/21/22 appt noted:   Doesn't like taking more meds.  Spending a couple hours daily with OCD.  No SI No med changes.  02/21/22 appt noted: Patient received Spravato 84 mg today.  She  tolerated it well without any unusual headache, nausea or vomiting or other somatic symptoms.  Dissociation did occur and she gradually saw resolution over the 2-hour period of observation.  She does not typically find the dissociation very strong. No SE complaints with meds. She has chronic depesssion and OCD but is improved with Spravato, both dx versus before.  She has continued Luvox 400 mg and Wellbutrin 450 mg and is tolerating it.  Chronically easily overwhelmed and doesn't know why.  Tolerating all meds. Wants to continue meds.  03/16/22 appt noted: Patient received Spravato 84 mg today.  She tolerated it well without any unusual headache, nausea or vomiting or other somatic symptoms.  Dissociation did occur and she gradually saw resolution over the 2-hour period of observation.  She does not typically find the dissociation very strong. No SE complaints with meds. Overall she still feels the Spravato has been helpful not only for her depression but also for her OCD which was somewhat unexpected.  OCD is still significant but it is less severe than prior to starting Spravato.  She is tolerating Luvox 400 mg and Wellbutrin 450 mg.  We discussed possible med adjustments.  Previous psych med trials include Prozac, paroxetine, sertraline, fluvoxamine, venlafaxine, Anafranil with no response,  Wellbutrin, , Viibryd, Trintellix 10 1 month NR Geodon,  risperidone, Rexulti, Abilify,  Seroquel, Latuda 40 mg with irritability.  lamotrigine lithium,  BuSpar, Namenda,  pramipexole with no response, and Topamax, pindolol  ECT-MADRS    [facility] Office Visit from 06/29/2021 in [facility] Total Score 36      Flowsheet Row Admission (Discharged) from 06/11/2021 in [facility] No Risk        Review of Systems:  Review of Systems  Constitutional:  Positive for fatigue.  Cardiovascular:  Negative for palpitations.   Musculoskeletal:  Positive for arthralgias, back pain and gait problem. Negative for joint swelling.  Neurological:  Positive for weakness. Negative for tremors.  Psychiatric/Behavioral:  Positive for dysphoric mood. Negative for suicidal ideas. The patient is nervous/anxious.    Medications: I have reviewed the patient's current medications.  Current Outpatient Medications  Medication Sig Dispense Refill   Abaloparatide (TYMLOS) 3120 MCG/1.56ML SOPN Inject into the skin.     Azelastine-Fluticasone 137-50 MCG/ACT SUSP Place 1-2 sprays into both nostrils daily.     baclofen (LIORESAL) 10 MG tablet Take 20 mg by mouth at bedtime as needed for muscle spasms.     buPROPion (WELLBUTRIN XL) 150 MG 24 hr tablet TAKE 3 TABLETS BY MOUTH DAILY. 270 tablet 0   buPROPion (WELLBUTRIN XL) 300 MG 24 hr tablet TAKE 1 TABLET BY MOUTH EVERY DAY 90 tablet 0   dicyclomine (BENTYL) 10 MG capsule Take 10 mg by mouth daily.  docusate sodium (COLACE) 100 MG capsule Take 1 capsule (100 mg total) by mouth 2 (two) times daily. (Patient taking differently: Take 100 mg by mouth daily.) 10 capsule 0   Esketamine HCl, 84 MG Dose, (SPRAVATO, 84 MG DOSE,) 28 MG/DEVICE SOPK USE 3 SPRAYS IN EACH NOSTRIL TWICE A WEEK 3 each 3   fexofenadine (ALLEGRA) 180 MG tablet Take 180 mg by mouth daily.     fluvoxaMINE (LUVOX) 100 MG tablet Take 3 tablets (300 mg total) by mouth at bedtime. 270 tablet 0   fluvoxaMINE (LUVOX) 100 MG tablet Take 1 tablet (100 mg total) by mouth at bedtime. 90 tablet 0   hydrocortisone (ANUSOL-HC) 2.5 % rectal cream Place rectally 2 (two) times daily. x 7-14 days 30 g 0   ketotifen (ZADITOR) 0.025 % ophthalmic solution Place 3 drops into both eyes at bedtime.     LORazepam (ATIVAN) 1 MG tablet 2 in the AM and HS and 1 tablet in afternoon 150 tablet 3   magnesium gluconate (MAGONATE) 500 MG tablet Take 500 mg by mouth daily.     MIBELAS 24 FE 1-20 MG-MCG(24) CHEW Chew 1 tablet by mouth at bedtime as needed  (bowel regularity).     Multiple Vitamins-Minerals (ADULT GUMMY PO) Take 2 tablets by mouth in the morning.     nitrofurantoin (MACRODANTIN) 100 MG capsule Take 100 mg by mouth as needed (For urinary tract infection.).      oxyCODONE-acetaminophen (PERCOCET/ROXICET) 5-325 MG tablet Take 1-2 tablets by mouth every 6 (six) hours as needed for severe pain. 50 tablet 0   polyethylene glycol (MIRALAX / GLYCOLAX) packet Take 17 g by mouth daily as needed for mild constipation. 14 each 0   psyllium (METAMUCIL) 58.6 % powder Take 1 packet by mouth daily as needed (constipation).     temazepam (RESTORIL) 30 MG capsule Take 1 capsule (30 mg total) by mouth at bedtime as needed for sleep. 30 capsule 5   Vitamin D-Vitamin K (VITAMIN K2-VITAMIN D3 PO) Take 1-2 sprays by mouth daily.     No current facility-administered medications for this visit.    Medication Side Effects: None   Allergies:  Allergies  Allergen Reactions   Hydrocodone Itching   Sulfamethoxazole-Trimethoprim Itching   Dust Mite Extract Other (See Comments)    Sneezing, watery eyes, runny nose   Latex Itching   Other Other (See Comments)    PT IS ALLERGIC TO CAT DANDER AND RAGWEED - Sneezing, watery eyes, runny nose    Pollen Extract Other (See Comments)    Sneezing, watery eyes, runny nose     Past Medical History:  Diagnosis Date   Abnormal Pap smear 2011   hpv/mild dysplasia,cin1   Anxiety    Cerebral palsy (HCC)    right arm/leg   Cystocele    Depression    Headache    Neuromuscular disorder (HCC)    Cerebral Palsy   OCD (obsessive compulsive disorder)    Osteoporosis    Uterine prolaps     Family History  Problem Relation Age of Onset   Cancer Father        skin AND LUNG   Alcohol abuse Sister        CRACK COCAINE    Social History   Socioeconomic History   Marital status: Married    Spouse name: Not on file   Number of children: Not on file   Years of education: Not on file   Highest education  level: Not on file  Occupational History   Not on file  Tobacco Use   Smoking status: Never   Smokeless tobacco: Never  Substance and Sexual Activity   Alcohol use: Not Currently    Comment: OCCASIONAL beer   Drug use: No   Sexual activity: Yes    Birth control/protection: Pill    Comment: LOESTRIN 24 FE  Other Topics Concern   Not on file  Social History Narrative   Not on file   Social Determinants of Health   Financial Resource Strain: Not on file  Food Insecurity: Not on file  Transportation Needs: Not on file  Physical Activity: Not on file  Stress: Not on file  Social Connections: Not on file  Intimate Partner Violence: Not on file    Past Medical History, Surgical history, Social history, and Family history were reviewed and updated as appropriate.   Please see review of systems for further details on the patient's review from today.   Objective:   Physical Exam:  LMP  (LMP Unknown)   Physical Exam Neurological:     Mental Status: She is alert and oriented to person, place, and time.     Cranial Nerves: No dysarthria.     Motor: Weakness present.     Gait: Gait abnormal.  Psychiatric:        Attention and Perception: Attention and perception normal.        Mood and Affect: Mood is anxious and depressed. Affect is not labile or tearful.        Speech: Speech normal.        Behavior: Behavior normal. Behavior is cooperative.        Thought Content: Thought content normal. Thought content is not delusional. Thought content does not include homicidal or suicidal ideation. Thought content does not include suicidal plan.        Cognition and Memory: Cognition and memory normal. Cognition is not impaired.        Judgment: Judgment normal.     Comments: Insight intact Pleasant. Ongoing OCD remains fairly severe but less anxious Checking compulsions  2 hours daily but improving noticeably Chronic depression persistent but better with Spravato     Lab Review:      Component Value Date/Time   NA 138 06/11/2021 0606   K 4.0 06/11/2021 0606   CL 107 06/11/2021 0606   CO2 26 06/11/2021 0606   GLUCOSE 90 06/11/2021 0606   BUN 18 06/11/2021 0606   CREATININE 0.81 06/11/2021 0606   CALCIUM 9.4 06/11/2021 0606   PROT 6.5 06/11/2021 0606   ALBUMIN 3.3 (L) 06/11/2021 0606   AST 17 06/11/2021 0606   ALT 14 06/11/2021 0606   ALKPHOS 141 (H) 06/11/2021 0606   BILITOT 0.2 (L) 06/11/2021 0606   GFRNONAA >60 06/11/2021 0606   GFRAA >60 07/09/2016 0438       Component Value Date/Time   WBC 5.8 06/11/2021 0606   RBC 4.12 06/11/2021 0606   HGB 12.5 06/11/2021 0606   HCT 39.7 06/11/2021 0606   PLT 299 06/11/2021 0606   MCV 96.4 06/11/2021 0606   MCH 30.3 06/11/2021 0606   MCHC 31.5 06/11/2021 0606   RDW 13.9 06/11/2021 0606   LYMPHSABS 1.9 06/11/2021 0606   MONOABS 0.5 06/11/2021 0606   EOSABS 0.1 06/11/2021 0606   BASOSABS 0.0 06/11/2021 0606    No results found for: POCLITH, LITHIUM   No results found for: PHENYTOIN, PHENOBARB, VALPROATE, CBMZ   .res Assessment: Plan:    Imaya was  seen today for follow-up, depression and anxiety.  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  Impaired function but less so with Spravato re: depression..   She is receiving Spravato 84 mg weekly and marked improvement in the depression..  she feels it also helps OCD somewhat.  However still easily overwhelmed with low stress tolerance.  The OCD is improving some with the increase in fluvoxamine and with Spravato.  Spends 2 hours daily and checking compulsions.  She has been on higher doses of fluvoxamine above the usual max of 400 mg daily in the past.  This became difficult to obtain at 1 point and the dose was reduced to 300 mg daily.    Disc SE. She would like to continue Luvox 400 mg daily again to see if her OCD can be better controlled   She is tolerating the meds well  Increased Luvox back to 400 mg nightly as of the third week of January 2023. Consider reevaluating dose.  Disc dosing.  Sehe feels this is starting to help more with OCD which remains chronically severe. Increased Wellbutrin XL back to 450 mg every morning for depression as of the third week of January 2023 Consider exchanging some of the Wellbutrin with Auvelity in hopes of further mood improvement.  We discussed potential side effects and drug interaction issues.  Disc Spravato DT TRD incl details and SE. Disc dosing and duration.  Pt with severe depression MADRS 36 on 06/29/21 Patient was administered Spravato 84 mg intranasally dosage today.  The patient experienced the typical dissociation which gradually resolved over the 2-hour period of observation.  There were no complications.  Specifically the patient did not have nausea or vomiting or headache.  Blood pressures remained within normal ranges at the 40-minute and 2-hour follow-up intervals.  By the time the 2-hour observation period was met the patient was alert and oriented and able to exit without assistance. She tends to have lingering sedative effects but not severe. .  See nursing note for further details. Per protocol will continue Spravato to 84 mg next session.  She has been okay since cutting back to once weekly.  We discussed the short-term risks associated with benzodiazepines including sedation and increased fall risk among others.  Discussed long-term side effect risk including dependence, potential withdrawal symptoms, and the potential eventual dose-related risk of dementia.  But recent studies from 2020 dispute this association between benzodiazepines and dementia risk. Newer studies in 2020 do not support an association with dementia. For TR anxiety lorazepam to 68m AM and HS and 1 mg in  afternoon.  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  Supportive therapy dealing with some of the recent stressors including family stressors and her own health related to cerebral palsy   Follow-up weekly .  Consider twice weekly.   CLynder Parents MD, DFAPA  Please see After Visit Summary for patient specific instructions.  Future Appointments  Date Time Provider DNichols 03/23/2022 10:30 AM ZVedia Pereyra PT [facility]  05/09/2022 11:00 AM MBlanchie Serve PhD [facility] None  06/13/2022 11:00 AM MBlanchie Serve PhD [facility] None    No orders of the defined types were placed in this encounter.    -------------------------------

## 2022-03-18 ENCOUNTER — Ambulatory Visit (HOSPITAL_BASED_OUTPATIENT_CLINIC_OR_DEPARTMENT_OTHER): Payer: 59 | Attending: Orthopedic Surgery | Admitting: Physical Therapy

## 2022-03-18 ENCOUNTER — Encounter (HOSPITAL_BASED_OUTPATIENT_CLINIC_OR_DEPARTMENT_OTHER): Payer: Self-pay | Admitting: Physical Therapy

## 2022-03-18 DIAGNOSIS — R262 Difficulty in walking, not elsewhere classified: Secondary | ICD-10-CM | POA: Diagnosis present

## 2022-03-18 DIAGNOSIS — M5459 Other low back pain: Secondary | ICD-10-CM | POA: Insufficient documentation

## 2022-03-18 DIAGNOSIS — M6281 Muscle weakness (generalized): Secondary | ICD-10-CM

## 2022-03-18 DIAGNOSIS — R2689 Other abnormalities of gait and mobility: Secondary | ICD-10-CM

## 2022-03-18 NOTE — Therapy (Signed)
OUTPATIENT PHYSICAL THERAPY TREATMENT NOTE   Patient Name: Casey Diaz MRN: [id number] DOB:07/30/1966, 56 y.o., female Today's Date: 03/18/2022  PCP: Russo, John, MD REFERRING PROVIDER: Russo, John, MD   PT End of Session - 03/18/22 1044     Visit Number 17    Number of Visits 22    Date for PT Re-Evaluation 04/22/22    PT Start Time 1035    PT Stop Time 1115    PT Time Calculation (min) 40 min    Activity Tolerance Patient tolerated treatment well    Behavior During Therapy WFL for tasks assessed/performed                Past Medical History:  Diagnosis Date   Abnormal Pap smear 2011   hpv/mild dysplasia,cin1   Anxiety    Cerebral palsy (HCC)    right arm/leg   Cystocele    Depression    Headache    Neuromuscular disorder (HCC)    Cerebral Palsy   OCD (obsessive compulsive disorder)    Osteoporosis    Uterine prolaps    Past Surgical History:  Procedure Laterality Date   COLPOSCOPY  2011   ELBOW SURGERY     left elbow-separation of bones -age 13   ELBOW SURGERY  2015   fell on ice- right elbow fracture   FRACTURE SURGERY     HAMMER TOE SURGERY  1/13   RIGHT SIDE   HIP PINNING,CANNULATED Left 07/08/2016   Procedure: LEFT CLOSED REDUCTION HIP AND PERCUTANEOUS SCREW;  Surgeon: Matthew Olin, MD;  Location: [facility];  Service: Orthopedics;  Laterality: Left;   ORIF HUMERUS FRACTURE Right 06/11/2021   Procedure: OPEN REDUCTION INTERNAL FIXATION (ORIF) DISTAL HUMERUS FRACTURE;  Surgeon: Handy, Michael, MD;  Location: [facility] OR;  Service: Orthopedics;  Laterality: Right;   WRIST SURGERY  2005   left wrist   Patient Active Problem List   Diagnosis Date Noted   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HCC) 08/03/2018   Relationship problem with family member 08/03/2018   Femoral neck fracture, left, closed, initial encounter 07/08/2016   Severe recurrent major depression without psychotic features (HCC) 06/03/2015    Class:  Chronic   Obsessive-compulsive disorder 06/03/2015    Class: Chronic    REFERRING DIAG: M54.51 (ICD-10-CM) - Vertebrogenic low back pain   THERAPY DIAG:  Muscle weakness (generalized) - Plan: PT plan of care cert/re-cert  Difficulty in walking, not elsewhere classified - Plan: PT plan of care cert/re-cert  Other abnormalities of gait and mobility - Plan: PT plan of care cert/re-cert  PERTINENT HISTORY: CP OPEN REDUCTION INTERNAL FIXATION (ORIF) DISTAL HUMERUS FRACTURE IM nail Left hip 2017 Osteoporosis  PRECAUTIONS: fall  SUBJECTIVE:  Pt reports she has a lot on her mind.  She is interested in continuation of therapy, but at a decreased frequency.  She reports she has noticed improvement since starting therapy  PAIN:  Are you having pain? Yes NPRS scale: 2/10 current ankle, 3-4/10 in low back and neck Pain location:  Rt ankle; bilat lumbar, back of head  PAIN TYPE: aching, sore Pain description: constant  Aggravating factors: walking  Relieving factors: rest    PRECAUTIONS: Fall   WEIGHT BEARING RESTRICTIONS No   FALLS:  Has patient fallen in last 6 months? No, Number of falls: 1 in Aug 2022   LIVING ENVIRONMENT: Lives with: lives with their family Lives in: House/apartment Stairs: Yes; Internal: 15 steps; none Has following equipment at home:   None     PLOF: Independent with household mobility without device   PATIENT GOALS to get stronger, more coordinated and better balanced.     OBJECTIVE: * Findings taken at EVAL unless otherwise noted.      PATIENT SURVEYS:  FOTO 44 with goal of 50 01/20/22: 53     COGNITION:          Overall cognitive status: Within functional limits for tasks assessed                        SENSATION:          intact   MUSCLE LENGTH: Hamstrings: Right 70 deg; Left 90 deg Thomas test: Right neg deg; Left neg deg   POSTURE:   left shoulder elevation, weightbearing shifted left   PALPATION: Tenderness bilat sub occipital and  upper traps   LUMBARAROM/PROM   A/PROM A/PROM  12/15/2021  Flexion Forward reach 4 inches to floor  Extension    Right lateral flexion    Left lateral flexion    Right rotation    Left rotation     (Blank rows = not tested)   LE AROM/PROM:   A/PROM Right 12/15/2021 Left 12/15/2021 Right 01/20/22 Left 01/20/22 Right/Left 03/18/22  Hip flexion 90 105   105/114  Hip extension         Hip abduction 25 30 28 35   Hip adduction         Hip internal rotation         Hip external rotation         Knee flexion 105 120     Knee extension         Ankle dorsiflexion 5       Ankle plantarflexion 10       Ankle inversion         Ankle eversion          (Blank rows = not tested)   LE MMT:   MMT Right 12/15/2021 Left 12/15/2021  Hip flexion 5 4  Hip extension 4 4  Hip abduction 5 5  Hip adduction 5 5  Hip internal rotation      Hip external rotation      Knee flexion 5 5  Knee extension      Ankle dorsiflexion      Ankle plantarflexion      Ankle inversion      Ankle eversion       (Blank rows = not tested)   LUMBAR SPECIAL TESTS:  Straight leg raise test: Negative, Slump test: Negative, and Thomas test: Negative   FUNCTIONAL TESTS:  5 times sit to stand: 20 Timed up and go (TUG): 13 Berg Balance Scale: 36/56  03/18/22: 41/56  02/01/22:  5x STS  = 12.09 sec (no UE support)   GAIT: Distance walked: 500ft Assistive device utilized: None Level of assistance: Complete Independence Comments: right trendelenburg, left leaning, no heel strike or knee extension right, shortened step length         TODAY'S TREATMENT  Pt seen for aquatic therapy today.  Treatment took place in water 3.25-4.8 ft in depth at the [facility] pool. Temp of water was 91.  Pt entered/exited the pool via stairs (step to pattern) independently with single rail.  Warm up:  Multiple widths of walking forward, backward, and sidestepping without UE support  Standing: Toe taps to 1st, 2nd step and  return to neutral, no UE support x ~  10each leg. Mild LOB with fatigue.  Seated on yellow noodle: Cycling -varied speed / scissors/ CC ski for aerobic capacity  Seated on 5th step from bottom: STS x10 Cues for immediate standing balance. (Pt gains immediate standing balance 8/10)    -ball squeeze  adductor 10x 10s Throwing orange weighted ball at wall x 10 Lap each of forward/backward walking for rest and recovery   Pt requires buoyancy for support and to offload joints with strengthening exercises. Viscosity of the water is needed for resistance of strengthening; water current perturbations provides challenge to standing balance unsupported, requiring increased core activation.  "On Land" Objective testing Balance retraining   PATIENT EDUCATION:  Education details: Condition management;  progression of exercise Person educated: Patient Education method: Explanation Education comprehension: verbalized understanding     HOME EXERCISE PROGRAM: Access Code: [email or web address] URL: [email or web address] Date: 12/15/2021 Prepared by: Frankie Ziemba   Exercises Supine Lower Trunk Rotation - 1 x daily - 7 x weekly - 3 sets - 10 reps Supine Single Knee to Chest Stretch - 1 x daily - 7 x weekly - 3 sets - 10 reps   ASSESSMENT:   CLINICAL IMPRESSION: LBP and cervical pain continues to improve fluctuating some but overall pain and decreased, pt no longer limited by. Hip flex improved bilaterally but goal not yet met as right hip flex progressing slower than anticipated. Balance improvement as per BERg test to 41/56 from 36. She continues to make progress. Have not yet met balance goal to demonstrate a decrease in fall risk, nor has pt reached her max potential.  She will continue to benefit from skilled aquatic PT x 5 weeks with decreased frequency allowing pt to demonstrate and be indep with developing aquatic HEP.    OBJECTIVE IMPAIRMENTS Abnormal gait, decreased activity tolerance,  decreased balance, decreased coordination, decreased mobility, difficulty walking, decreased strength, and pain.    ACTIVITY LIMITATIONS cleaning, community activity, yard work, and shopping.    PERSONAL FACTORS Time since onset of injury/illness/exacerbation and 3+ comorbidities:    are also affecting patient's functional outcome.      REHAB POTENTIAL: Good   CLINICAL DECISION MAKING: Evolving/moderate complexity   EVALUATION COMPLEXITY: Moderate     GOALS: Goals reviewed with patient? Yes   SHORT TERM GOALS:   STG Name Target Date Goal status  1 Pt will tolerate entire sessions of aquatic therapy without increase in cervical or LBP Baseline:  01/05/2022  Achieved 01/20/22  2 Pt will improve bilat hip abduction by 5d Baseline: R 25d; L30d 01/05/2022  Partially met 01/20/22  3 Pt will improve bilat hip flex to 110d Baseline:R 90d; L 105d 7/723  Ongoing 01/20/22    LONG TERM GOALS:    LTG Name Target Date Goal status  1 Pt will improve on FOTO  to 50 to demonstrate improved functional status Baseline:44 01/26/2022  Achieved 01/20/22  2 Pt will improve on Berg Balance Test to >or= 46/56 to demonstrate improvement in balance and decreased fall risk Baseline:36/56 7/723  Ongoing   3 Pt will improve on 5 X STS test to <or= 15 to demonstrate improvement in LE strength Baseline:12.09 seconds 02/01/22 03/03/2022 Achieved 02/01/22    4 Pt will decrease overall pain in cervical spine and LB to 4/10 or< for improved toleration to activity Baseline: 04/22/22  Ongoing   5   Pt to be indep with final aquatic HEP 04/22/22  NEW          PLAN: PT FREQUENCY: 1/week     PT DURATION: 5 weeks   PLANNED INTERVENTIONS: Therapeutic exercises, Therapeutic activity, Neuromuscular re-education, Balance training, Gait training, Patient/Family education, Joint manipulation, Joint mobilization, Stair training, Aquatic Therapy, Taping, and Manual therapy   PLAN FOR NEXT SESSION: balance: issue laminated  HEP in upcoming visit.    Stanton Kidney Tharon Aquas) Tyrisha Benninger MPT 03/18/22 5:55 PM

## 2022-03-22 NOTE — Progress Notes (Signed)
NURSE Visit:   Pt arrived for her weekly Spravato Treatment, she started Spravato treatments on 10/07/2021, she continues with 84 mg (3 of the 28 mg) Spravato nasal spray. which is the maintenance dose for her treatment resistant depression.  She was directed to the treatment room to get vitals taken first. Her B/P at 3:10 PM 102/64, 88, Pt instructed to blow her nose and to recline back at 45 degrees. Pt given first nasal spray (28 mg) administered by pt observed by nurse. There were 5 minutes between each dose, total of 84 mg. Tolerated well. Pt's medication is delivered by [facility] in [place] and stored inside a safe behind a locked door as well. Spravato is a CIII medication and has to be only given at a treatment facility and observed by nurse as pt administered intranasally.  Pt's 40 minute vital signs at 3:50 PM 101/61, 81. Dr. Clovis Pu met with pt to discuss her care at the end of her treatment when her thoughts are clearer. She voiced no complaints, no headaches, nausea, vomiting, mood better today. She does go to the bathroom at least once during her treatment. No sedation and had slight feeling of being "high" she reports.  Discharge vitals at 5:28 PM 132/82, 86. Pt stable for discharge, pt is scheduled next Wednesday, June 7th. Pt was observed on site a total of 120 minutes per FDA/REMS requirements. Pt was with nurse for clinical assessment a total of 55 minutes.    LOT [id number] EXP 2025 FEB

## 2022-03-23 ENCOUNTER — Encounter: Payer: Self-pay | Admitting: Psychiatry

## 2022-03-23 ENCOUNTER — Ambulatory Visit (INDEPENDENT_AMBULATORY_CARE_PROVIDER_SITE_OTHER): Payer: 59 | Admitting: Psychiatry

## 2022-03-23 ENCOUNTER — Ambulatory Visit: Payer: 59

## 2022-03-23 ENCOUNTER — Ambulatory Visit (HOSPITAL_BASED_OUTPATIENT_CLINIC_OR_DEPARTMENT_OTHER): Payer: 59 | Admitting: Physical Therapy

## 2022-03-23 VITALS — BP 125/82 | HR 84

## 2022-03-23 DIAGNOSIS — F4010 Social phobia, unspecified: Secondary | ICD-10-CM | POA: Diagnosis not present

## 2022-03-23 DIAGNOSIS — F5105 Insomnia due to other mental disorder: Secondary | ICD-10-CM

## 2022-03-23 DIAGNOSIS — F422 Mixed obsessional thoughts and acts: Secondary | ICD-10-CM | POA: Diagnosis not present

## 2022-03-23 DIAGNOSIS — G809 Cerebral palsy, unspecified: Secondary | ICD-10-CM

## 2022-03-23 DIAGNOSIS — F339 Major depressive disorder, recurrent, unspecified: Secondary | ICD-10-CM | POA: Diagnosis not present

## 2022-03-23 NOTE — Progress Notes (Signed)
Casey Diaz [id number] 09-Jan-1966 56 y.o.    Subjective:   Patient ID:  Casey Diaz is a 56 y.o. (DOB Dec 07, 1965) female.  Chief Complaint:  Chief Complaint  Patient presents with   Follow-up   Depression   Anxiety   Fatigue     HPI Casey Diaz presents to the office today for follow-up of OCD and severe anxiety.     December 2019 visit the following was noted: No meds were changed. Lives in [place] and back for followup.  Sx are about the same.  Has to take meds with different sizes. Pt reports that mood is Anxious and Depressed and describes anxiety as Severe. Anxiety symptoms include: Excessive Worry, Obsessive Compulsive Symptoms:   Checking,,. Pt reports has interrupted sleep and nocturia. Pt reports that appetite is good. Pt reports that energy is no change and down slightly. Concentration is down slightly. Suicidal thoughts:  denied by patient. Loves the environment of [place] but misses some things there.  She's not able to work there.  H works there and likes it.  Struggled with not working, feels isolated and not up to task of meeting people.  Does attend a church and met a friend who's been helpful.  Leaving for [place] on 10/16/18.   04/09/2020 appointment the following is noted:  Staying another year in [place] bc Covid and other things. Last few months a lot of crying spells.  Is in menopause. Wonders about med changes though is nervous about it.  Crying spells associated with depressing thoughts more than stress or OCD.   Covid really hard on everyone and couldn't see family for 18 mos.  Family still very dysfunctional. No close friends in part due to OCD and depression. Son high Autism spectrum with ADHD and anxiety and she's with him all the time. Greater health problems with CP so more pains.   05/15/20 appt with the following noted: Casey Diaz for menopause and helps some. Still depressed.  Chronically. In [place] for 2 more weeks then to [place] for  another year. A lot of stressors lately triggering more checking and anxiety.   OCD is her CC now and seems.  Got worse DT stress.   Stressed with Asberger's son and her health.  H works a lot.  Her FOO still stress. Plan: Trintellix 10 mg 1 tablet in the morning with food and reduce fluvoxamine to 5 tablets nightly for 1 week  then reduce it to 4 tablets nightly.   07/02/20 appt with the following noted: Decided not to get Trintellix bc difficulty getting it. It is available.  There.  Wants to start it now.   Both depression and OCD are severe.  Not suicidal in intent or plan. Did not take samples with her to [place] but will be back in December. covid is worse there and travel is difficult.  Wants to reduce Wellbutrin DT dry mouth. Plan: She's afraid to reduce Luvox at this time DT fear of worsening OCD.  But will consider. Trintellix 10 mg 1 tablet in the morning with food and reduce fluvoxamine to 5 tablets nightly for 1 week  then reduce it to 4 tablets nightly. Also reduce Wellbutrin XL to 300 mg daily.    9-13 2022 appointment with the following noted: Back in [place] since July 14.  Broke arm a month ago and surgery.  It's all been rough adjustment.   B has cancer on his face and M fell taking him to the doctor.  Misses the water and weather of [place].   Cry a lot more since menopause. Still depression and anxiety and OCD.  Asks about ketamine. On Wellbutrin 300, Luvox 300.  No Trintellix. Added Ativan 2 mg AM and HS and it helps.  More likely to get upset at night. Plan: Increase Luvox back to 400 mg daily.  She thinks she's worse on less. Continue Wellbutrin XL to 300 mg daily. 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  She tolerated it well without unusual nausea or vomiting headache or other somatic symptoms.  She did have the expected dissociation which gradually resolved over  the course of the 2-hour period of observation.  She was a little concerned about her balance given her cerebral palsy but has not noted unusual or unexpected problems.  She is motivated to can continue Spravato in hopes of reducing her depressive symptoms. 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  She also has treatment resistant OCD which is partially managed with medications but is still quite disabling.  She is tolerating the medications well.  She is sleeping adequately.  Her appetite is adequate.  She is not having suicidal thoughts.  She continues to wish for a better treatment for OCD that would give her some relief.  09/30/2021 appointment with the following noted: She received her first dose of Spravato 84 mg intranasally today.  She tolerated it well without unusual nausea, vomiting, or other somatic symptoms.  Dissociation as expected did occur and gradually resolved over the 2-hour period of observation.  She did have a mild headache today with the treatment and received ibuprofen 600 mg at her request.  We will follow this to see if it is a pattern Patient is still depressed.  She said she was late with her medicine today and today was a particularly depressing day.  However she notes that the Spravato has lifted her mood considerably even today.  She is hopeful that it will continue to be helpful.  No suicidal thoughts.  She has ongoing chronic anxiety and OCD at baseline.  10/04/21 appt noted: Patient received Spravato 84 mg for the second time today.  She tolerated it well without any unusual headache, nausea or vomiting or other somatic symptoms.  Dissociation did occur and she gradually [place] resolution over the 2-hour period of observation. She did not have any unusual problems after she left the office last Spravato administration.  She did not have any specific problems with balance or walking.  She is at increased risk of that difficulty because of  cerebral palsy.  So far she has not noticed much mood effect from the medication beyond the first day of receiving it.  However she would like to continue Spravato in hopes of getting the antidepressant effect that is desired. Stress dealing with mother's behavior at party pt hosted.  Guilt over it.  10/07/2021 appointment noted: Patient received Spravato 84 mg for the second time today.  She tolerated it well without any unusual headache, nausea or vomiting or other somatic symptoms.  Dissociation did occur and she gradually [place] resolution over the 2-hour period of observation. She still is not sure about the antidepressant effect of Spravato.  Events over the holidays and demands, make it difficult to assess.  She still notes that the OCD tends to worsen the depression and vice versa.  She tolerates the  Spravato well and wants to continue the trial.  10/15/2021 appointment with the following noted: Patient received Spravato 84 mg for the second time today.  She tolerated it well without any unusual headache, nausea or vomiting or other somatic symptoms.  Dissociation did occur and she gradually [place] resolution over the 2-hour period of observation. Patient says it was somewhat difficult to evaluate the effect of the Spravato.  It was scheduled to be twice weekly for 4 weeks consecutively but the holidays have interfered with that administration.  She asked what specifically should be she should be looking for in order to assess improvement.  That was discussed.  The OCD is unchanged and the depression so far is not significantly different.  She still tolerates meds.  There have been no recent med changes  10/19/2021 appt noted: Patient received Spravato 84 mg for the second today.  She tolerated it well without any unusual headache, nausea or vomiting or other somatic symptoms.  Dissociation did occur and she gradually saw resolution over the 2-hour period of observation.   10/21/2021 appointment  noted: Patient received Spravato 84 mg today.  She tolerated it well without any unusual headache, nausea or vomiting or other somatic symptoms.  Dissociation did occur and she gradually saw resolution over the 2-hour period of observation.  She feels better than last week.  She is not as depressed and down.  She is still dealing with grief around the death of her cousin that was unexpected.  It is still difficult to tell how much the Spravato was doing but she is hopeful.  Anxiety is still present with the OCD.  She is not having suicidal thoughts.  She is not hopeless.  She wants to continue treatment.  10/25/2021 appointment with the following noted: Patient received Spravato 84 mg today.  She tolerated it well without any unusual headache, nausea or vomiting or other somatic symptoms.  Dissociation did occur and she gradually saw resolution over the 2-hour period of observation.  She does not typically find the dissociation very strong. She is beginning to think the Spravato is helping somewhat with the depression.  It has been difficult to tell with the holidays intervening as well as the death of her cousin.  She has not been able to get Spravato twice weekly for 4 weeks straight as typically planned.  However she is hopeful.  The OCD remains significant.  She still has a tendency to think very negatively.  She is not suicidal.  10/28/2021 appointment with the following noted: Patient received Spravato 84 mg today.  She tolerated it well without any unusual headache, nausea or vomiting or other somatic symptoms.  Dissociation did occur and she gradually saw resolution over the 2-hour period of observation.  She does not typically find the dissociation very strong. She is feeling more hopeful about the administration of Spravato.  She is having less depression she believes.  Still not dramatically different.  She still has a tendency to have a lot of anxiety and rumination and OCD.  She is not suicidal.   She is eager to continue the Spravato.  11/01/2021 appointment with the following noted: Patient received Spravato 84 mg today.  She tolerated it well without any unusual headache, nausea or vomiting or other somatic symptoms.  Dissociation did occur and she gradually saw resolution over the 2-hour period of observation.  She does not typically find the dissociation very strong. She is continuing to see a little bit of improvement in depression  with Spravato.  The anxiety remains but may be not as severe.  The OCD remains markedly severe chronically.  She is not suicidal.  She is encouraged by the degree of improvement with Spravato and inability to enjoy things more and not be quite as ruminative.  11/04/2021 appt noted: Patient received Spravato 84 mg today.  She tolerated it well without any unusual headache, nausea or vomiting or other somatic symptoms.  Dissociation did occur and she gradually saw resolution over the 2-hour period of observation.  She does not typically find the dissociation very strong. No SE complaints with meds. She continues to feel hopeful about the Spravato.  She has less depression.  Because of a number of factors she is uncertain of the full benefit but thinks she is somewhat less depressed.  Her anxiety and OCD remain significant but a little better.  She is tolerating the medications and does not desire medicine change.  She is not currently complaining of insomnia.   11/08/2021 appointment the following noted: Patient received Spravato 84 mg today.  She tolerated it well without any unusual headache, nausea or vomiting or other somatic symptoms.  Dissociation did occur and she gradually saw resolution over the 2-hour period of observation.  She does not typically find the dissociation very strong. No SE complaints with meds. She feels the Spravato is helping somewhat.  She would like to see a greater effect.  However she is able to enjoy things.  She is productive at home.   She would like to see a lifting of a degree of sadness that remains.  The anxiety and OCD remained largely unchanged.  She wondered about the dosing of Wellbutrin 300 mg a day and Luvox 300 mg a day and possible increases.  She has been at higher doses in the past.  She plans to start water therapy for her weakness and for her shoulder.  11/11/2021 appointment with the following noted: Patient received Spravato 84 mg today.  She tolerated it well without any unusual headache, nausea or vomiting or other somatic symptoms.  Dissociation did occur and she gradually saw resolution over the 2-hour period of observation.  She does not typically find the dissociation very strong. No SE complaints with meds. She feels the Spravato is clearly helping the depression.  She would like to see a more significant effect.  She is still having trouble thinking positive. Her energy is fair.  Concentration is good except for the problem with chronic obsessions. She has been taking Wellbutrin 300 mg in Luvox 300 mg for quite some time but has taken higher doses in the past.  We discussed that.  She would like to try higher doses in order to get a better effect if possible. We just increased the doses a couple of days ago.  No effect yet.  11/15/2021 appointment with the following noted: Patient received Spravato 84 mg today.  She tolerated it well without any unusual headache, nausea or vomiting or other somatic symptoms.  Dissociation did occur and she gradually saw resolution over the 2-hour period of observation.  She does not typically find the dissociation very strong. No SE complaints with meds. The patient is now convinced that the Spravato is helping the depression.  She would like to continue twice weekly Spravato this week if possible.  She has tolerated the increase in Wellbutrin to 450 mg daily and the increase and fluvoxamine to 400 mg daily without complications thus far.  The OCD and anxiety feed  the  depression to some extent. She spends approximately 2 hours daily with checking compulsions due to obsessions about causing harm to others.  For example fearing that when she has hit a pot hole that she may have hit a person and going back to check.  Checking corners and rooms out of fear that she may have harmed someone.  Other various checking compulsions.  She is hoping the increase in fluvoxamine to 400 mg will reduce that over the weeks to come.  She is not seeing a significant difference with the addition of the Spravato though she understands that was not expected.  She is more productive at home and more motivated and able to enjoy things more fully as a result of the Spravato treatment.  She is tolerating the medication  11/18/2021 appointment with the following noted: Patient received Spravato 84 mg today.  She tolerated it well without any unusual headache, nausea or vomiting or other somatic symptoms.  Dissociation did occur and she gradually saw resolution over the 2-hour period of observation.  She does not typically find the dissociation very strong. No SE complaints with meds. She clearly believes the Spravato has been helpful for the depression.  She wonders whether to continue to treatments weekly or to cut back to 1 weekly.  She would like to continue twice weekly in hopes of getting additional improvement in the depression because it is not resolved but it is difficult to get here twice a week in terms of arranging rides. She is recently increased Wellbutrin XL to 450 mg daily and fluvoxamine to 400 mg daily but they have not had time to have an official effect.  She is tolerating that well.  She is tolerating meds overwork overall well. The OCD remains the same as noted on 11/15/2021  11/25/21 appt noted: Patient received Spravato 84 mg today.  She tolerated it well without any unusual headache, nausea or vomiting or other somatic symptoms.  Dissociation did occur and she gradually saw  resolution over the 2-hour period of observation.  She does not typically find the dissociation very strong. No SE complaints with meds. She thinks the increase in Wellbutrin and Luvox have been potentially helpful for depression and OCD respectively.  It has been too early to see the full effect.  She is sleeping and eating well.  She is functioning at home.  She still spends a lot of time that is about 2 hours a day dealing with compulsive behaviors.  12/02/21 appt noted: Patient received Spravato 84 mg today.  She tolerated it well without any unusual headache, nausea or vomiting or other somatic symptoms.  Dissociation did occur and she gradually saw resolution over the 2-hour period of observation.  She does not typically find the dissociation very strong. No SE complaints with meds. Several losses and stressors recently that affect her sense of mood. However still sees significant benefit from the Spravato for her depression.  Wants to continue it. Suspect early  some benefit from the increased Wellbutrin for depression and Luvox for OCD. Tolerating meds. No complaints about the meds. Sleeping and eating well.  No new health concerns.  12/09/21 appt noted: Patient received Spravato 84 mg today.  She tolerated it well without any unusual headache, nausea or vomiting or other somatic symptoms.  Dissociation did occur and she gradually saw resolution over the 2-hour period of observation.  She does not typically find the dissociation very strong. No SE complaints with meds. Seeing noticeable improvement from  increase fluvoxamine to 400 mg daily.  Tolerating meds without concerns over them. Depression is stable with residual sx of easy guilt and easily stressed.  OCD contributes to depression but depression is not severe with less crying spells.  Productive at home with chores.  Enjoyed recent birthday.  Sleeping good. No new concerns.  12/23/2021 appointment noted: Patient received Spravato 84 mg  today.  She tolerated it well without any unusual headache, nausea or vomiting or other somatic symptoms.  Dissociation did occur and she gradually saw resolution over the 2-hour period of observation.  She does not typically find the dissociation very strong. No SE complaints with meds. Seeing noticeable improvement from increase fluvoxamine to 400 mg daily.  Tolerating meds without concerns over them. Her depression is somewhat improved with the Spravato.  She also feels generally a little lighter.  She is more motivated.  She is less overwhelmed by guilt.  The OCD is gradually improving but is still quite time-consuming as noted before.  She is sleeping well.  No side effects  12/30/2021 appointment with the following noted: Patient received Spravato 84 mg today.  She tolerated it well without any unusual headache, nausea or vomiting or other somatic symptoms.  Dissociation did occur and she gradually saw resolution over the 2-hour period of observation.  She does not typically find the dissociation very strong. No SE complaints with meds. Seeing noticeable improvement from increase fluvoxamine to 400 mg daily.  Tolerating meds without concerns over them. She is confident of her the improvement seen with Spravato.  She is less hopeless.  Guilt is marked remarkably improved.  She is not having any thoughts of death or dying.  She is more motivated for activities such as exercise which she is recently started.  She is sleeping well. The OCD remains severe but it is improving somewhat with the increase in fluvoxamine.  It is still consuming a couple hours per day.  01/10/22 apravato 84 admin  01/24/22 appt noted: Patient received Spravato 84 mg today.  She tolerated it well without any unusual headache, nausea or vomiting or other somatic symptoms.  Dissociation did occur and she gradually saw resolution over the 2-hour period of observation.  She does not typically find the dissociation very strong. No  SE complaints with meds. Very tearful today.  Feels like she has been suppressing emotion in the Spravato caused it to be released.  Discussed some stressors.  Overall still feels the medicine is helpful.  She has missed some of the scheduled Spravato treatments that were intended to be weekly due to circumstances beyond her control.  She is still struggling with OCD as previously noted but does believe the medications are helpful. Plan no med changes  01/31/2022 received Spravato 84 mg today  02/09/2022 appointment with the following noted: Patient received Spravato 84 mg today.  She tolerated it well without any unusual headache, nausea or vomiting or other somatic symptoms.  Dissociation did occur and she gradually saw resolution over the 2-hour period of observation.  She does not typically find the dissociation very strong. No SE complaints with meds. Spravato clearly helps depression and OCD but easily gets overwhelmed and tearful with fairly routine stressors.  Tolerating meds. Sleep and appetite is OK Asks to increase lorazepam to 2 mg AM and HS and 67m afternoon  02/16/22 appt noted: Patient received Spravato 84 mg today.  She tolerated it well without any unusual headache, nausea or vomiting or other somatic symptoms.  Dissociation did  occur and she gradually saw resolution over the 2-hour period of observation.  She does not typically find the dissociation very strong. No SE complaints with meds. She has chronic depesssion and OCD but is improved with Spravato, both dx versus before.  She has continued Luvox 400 mg and Wellbutrin 450 mg and is tolerating it.  Chronically easily stressed.  Tolerating all meds.  Doesn't like taking more meds.  Spending a couple hours daily with OCD.  No SI No med changes.  02/21/22 appt noted:   Doesn't like taking more meds.  Spending a couple hours daily with OCD.  No SI No med changes.  02/21/22 appt noted: Patient received Spravato 84 mg today.  She  tolerated it well without any unusual headache, nausea or vomiting or other somatic symptoms.  Dissociation did occur and she gradually saw resolution over the 2-hour period of observation.  She does not typically find the dissociation very strong. No SE complaints with meds. She has chronic depesssion and OCD but is improved with Spravato, both dx versus before.  She has continued Luvox 400 mg and Wellbutrin 450 mg and is tolerating it.  Chronically easily overwhelmed and doesn't know why.  Tolerating all meds. Wants to continue meds.  03/16/22 appt noted: Patient received Spravato 84 mg today.  She tolerated it well without any unusual headache, nausea or vomiting or other somatic symptoms.  Dissociation did occur and she gradually saw resolution over the 2-hour period of observation.  She does not typically find the dissociation very strong. No SE complaints with meds. Overall she still feels the Spravato has been helpful not only for her depression but also for her OCD which was somewhat unexpected.  OCD is still significant but it is less severe than prior to starting Spravato.  She is tolerating Luvox 400 mg and Wellbutrin 450 mg.  We discussed possible med adjustments.  03/23/22 appt noted: Patient received Spravato 84 mg today.  She tolerated it well without any unusual headache, nausea or vomiting or other somatic symptoms.  Dissociation did occur and she gradually saw resolution over the 2-hour period of observation.  She does not typically find the dissociation very strong. No SE complaints with meds. She is still depressed and still has OCD of course but is improved with the Spravato.  She is tolerating the medications well.  We had previously discussed the possibility of switching some of the Wellbutrin to [facility] and she is very interested in that in hopes of further improvement in depression and OCD.  She understands that Auvelity is not used for OCD on the label.  She is tolerating the  medications.  She is still easily overwhelmed.  She is sleeping and eating okay.  Previous psych med trials include Prozac, paroxetine, sertraline, fluvoxamine, venlafaxine, Anafranil with no response,  Wellbutrin, , Viibryd, Trintellix 10 1 month NR Geodon,  risperidone, Rexulti, Abilify,  Seroquel, Latuda 40 mg with irritability.  lamotrigine lithium,  BuSpar, Namenda,  pramipexole with no response, and Topamax, pindolol  ECT-MADRS    [place] Office Visit from 06/29/2021 in [place] Total Score 36      Flowsheet Row Admission (Discharged) from 06/11/2021 in [place] No Risk        Review of Systems:  Review of Systems  Constitutional:  Positive for fatigue.  Cardiovascular:  Negative for chest pain and palpitations.  Musculoskeletal:  Positive for arthralgias, back pain and gait problem. Negative  for joint swelling.  Neurological:  Positive for weakness. Negative for tremors.  Psychiatric/Behavioral:  Positive for dysphoric mood. Negative for suicidal ideas. The patient is nervous/anxious.    Medications: I have reviewed the patient's current medications.  Current Outpatient Medications  Medication Sig Dispense Refill   Abaloparatide (TYMLOS) 3120 MCG/1.56ML SOPN Inject into the skin.     Azelastine-Fluticasone 137-50 MCG/ACT SUSP Place 1-2 sprays into both nostrils daily.     baclofen (LIORESAL) 10 MG tablet Take 20 mg by mouth at bedtime as needed for muscle spasms.     buPROPion (WELLBUTRIN XL) 150 MG 24 hr tablet TAKE 3 TABLETS BY MOUTH DAILY. 270 tablet 0   buPROPion (WELLBUTRIN XL) 300 MG 24 hr tablet TAKE 1 TABLET BY MOUTH EVERY DAY 90 tablet 0   dicyclomine (BENTYL) 10 MG capsule Take 10 mg by mouth daily.     docusate sodium (COLACE) 100 MG capsule Take 1 capsule (100 mg total) by mouth 2 (two) times daily. (Patient taking differently: Take 100 mg by mouth daily.) 10 capsule 0   Esketamine  HCl, 84 MG Dose, (SPRAVATO, 84 MG DOSE,) 28 MG/DEVICE SOPK USE 3 SPRAYS IN EACH NOSTRIL TWICE A WEEK 3 each 3   fexofenadine (ALLEGRA) 180 MG tablet Take 180 mg by mouth daily.     fluvoxaMINE (LUVOX) 100 MG tablet Take 3 tablets (300 mg total) by mouth at bedtime. 270 tablet 0   fluvoxaMINE (LUVOX) 100 MG tablet Take 1 tablet (100 mg total) by mouth at bedtime. 90 tablet 0   hydrocortisone (ANUSOL-HC) 2.5 % rectal cream Place rectally 2 (two) times daily. x 7-14 days 30 g 0   ketotifen (ZADITOR) 0.025 % ophthalmic solution Place 3 drops into both eyes at bedtime.     LORazepam (ATIVAN) 1 MG tablet 2 in the AM and HS and 1 tablet in afternoon 150 tablet 3   magnesium gluconate (MAGONATE) 500 MG tablet Take 500 mg by mouth daily.     MIBELAS 24 FE 1-20 MG-MCG(24) CHEW Chew 1 tablet by mouth at bedtime as needed (bowel regularity).     Multiple Vitamins-Minerals (ADULT GUMMY PO) Take 2 tablets by mouth in the morning.     nitrofurantoin (MACRODANTIN) 100 MG capsule Take 100 mg by mouth as needed (For urinary tract infection.).      oxyCODONE-acetaminophen (PERCOCET/ROXICET) 5-325 MG tablet Take 1-2 tablets by mouth every 6 (six) hours as needed for severe pain. 50 tablet 0   polyethylene glycol (MIRALAX / GLYCOLAX) packet Take 17 g by mouth daily as needed for mild constipation. 14 each 0   psyllium (METAMUCIL) 58.6 % powder Take 1 packet by mouth daily as needed (constipation).     temazepam (RESTORIL) 30 MG capsule Take 1 capsule (30 mg total) by mouth at bedtime as needed for sleep. 30 capsule 5   Vitamin D-Vitamin K (VITAMIN K2-VITAMIN D3 PO) Take 1-2 sprays by mouth daily.     No current facility-administered medications for this visit.    Medication Side Effects: None   Allergies:  Allergies  Allergen Reactions   Hydrocodone Itching   Sulfamethoxazole-Trimethoprim Itching   Dust Mite Extract Other (See Comments)    Sneezing, watery eyes, runny nose   Latex Itching   Other Other (See  Comments)    PT IS ALLERGIC TO CAT DANDER AND RAGWEED - Sneezing, watery eyes, runny nose    Pollen Extract Other (See Comments)    Sneezing, watery eyes, runny nose     Past  Medical History:  Diagnosis Date   Abnormal Pap smear 2011   hpv/mild dysplasia,cin1   Anxiety    Cerebral palsy (HCC)    right arm/leg   Cystocele    Depression    Headache    Neuromuscular disorder (HCC)    Cerebral Palsy   OCD (obsessive compulsive disorder)    Osteoporosis    Uterine prolaps     Family History  Problem Relation Age of Onset   Cancer Father        skin AND LUNG   Alcohol abuse Sister        CRACK COCAINE    Social History   Socioeconomic History   Marital status: Married    Spouse name: Not on file   Number of children: Not on file   Years of education: Not on file   Highest education level: Not on file  Occupational History   Not on file  Tobacco Use   Smoking status: Never   Smokeless tobacco: Never  Substance and Sexual Activity   Alcohol use: Not Currently    Comment: OCCASIONAL beer   Drug use: No   Sexual activity: Yes    Birth control/protection: Pill    Comment: LOESTRIN 24 FE  Other Topics Concern   Not on file  Social History Narrative   Not on file   Social Determinants of Health   Financial Resource Strain: Not on file  Food Insecurity: Not on file  Transportation Needs: Not on file  Physical Activity: Not on file  Stress: Not on file  Social Connections: Not on file  Intimate Partner Violence: Not on file    Past Medical History, Surgical history, Social history, and Family history were reviewed and updated as appropriate.   Please see review of systems for further details on the patient's review from today.   Objective:   Physical Exam:  LMP  (LMP Unknown)   Physical Exam Neurological:     Mental Status: She is alert and oriented to person, place, and time.     Cranial Nerves: No dysarthria.     Motor: Weakness present.     Gait:  Gait abnormal.  Psychiatric:        Attention and Perception: Attention and perception normal.        Mood and Affect: Mood is anxious and depressed. Affect is not labile or tearful.        Speech: Speech normal. Speech is not slurred.        Behavior: Behavior normal. Behavior is cooperative.        Thought Content: Thought content normal. Thought content is not delusional. Thought content does not include homicidal or suicidal ideation. Thought content does not include suicidal plan.        Cognition and Memory: Cognition and memory normal. Cognition is not impaired.        Judgment: Judgment normal.     Comments: Insight intact Pleasant. Ongoing OCD remains fairly severe but less anxious Checking compulsions  2 hours daily but improving noticeably Chronic depression persistent but better with Spravato     Lab Review:     Component Value Date/Time   NA 138 06/11/2021 0606   K 4.0 06/11/2021 0606   CL 107 06/11/2021 0606   CO2 26 06/11/2021 0606   GLUCOSE 90 06/11/2021 0606   BUN 18 06/11/2021 0606   CREATININE 0.81 06/11/2021 0606   CALCIUM 9.4 06/11/2021 0606   PROT 6.5 06/11/2021 0606   ALBUMIN 3.3 (  L) 06/11/2021 0606   AST 17 06/11/2021 0606   ALT 14 06/11/2021 0606   ALKPHOS 141 (H) 06/11/2021 0606   BILITOT 0.2 (L) 06/11/2021 0606   GFRNONAA >60 06/11/2021 0606   GFRAA >60 07/09/2016 0438       Component Value Date/Time   WBC 5.8 06/11/2021 0606   RBC 4.12 06/11/2021 0606   HGB 12.5 06/11/2021 0606   HCT 39.7 06/11/2021 0606   PLT 299 06/11/2021 0606   MCV 96.4 06/11/2021 0606   MCH 30.3 06/11/2021 0606   MCHC 31.5 06/11/2021 0606   RDW 13.9 06/11/2021 0606   LYMPHSABS 1.9 06/11/2021 0606   MONOABS 0.5 06/11/2021 0606   EOSABS 0.1 06/11/2021 0606   BASOSABS 0.0 06/11/2021 0606    No results found for: POCLITH, LITHIUM   No results found for: PHENYTOIN, PHENOBARB, VALPROATE, CBMZ   .res Assessment: Plan:    Seletha was seen today for follow-up,  depression, anxiety and fatigue.  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  Impaired function but less so with Spravato re: depression..   She is receiving Spravato 84 mg weekly and marked improvement in the depression..  she feels it also helps OCD somewhat.  However still easily overwhelmed with low stress tolerance.  The OCD is improving some with the increase in fluvoxamine and with Spravato.  Spends 2 hours daily and checking compulsions.  She has been on higher doses of fluvoxamine above the usual max of 400 mg daily in the past.  This became difficult to obtain at 1 point and the dose was reduced to 300 mg daily.   Disc SE. She would like to continue Luvox 400 mg daily again to see if her OCD can be better controlled   She is tolerating the meds well  Continue Increased Luvox back to 400 mg nightly as of the third week of January 2023. Consider reevaluating dose.  Disc dosing.  Sehe feels this is starting to help more with OCD which remains chronically severe. Increased Wellbutrin XL back to 450 mg every morning for depression as of the third week of January 2023 Reduce Wellbutrin XL to 300 mg AM and add Auvelity 1 tablet each AM We discussed potential side effects and drug interaction issues.  Disc Spravato DT TRD incl details and SE. Disc dosing and duration.  Pt with severe depression MADRS 36 on 06/29/21 Patient was administered Spravato 84 mg intranasally dosage today.  The patient experienced the typical dissociation which gradually resolved over the 2-hour period of observation.  There were no complications.  Specifically the patient did not have nausea or vomiting or headache.  Blood pressures remained within normal ranges at the 40-minute and 2-hour  follow-up intervals.  By the time the 2-hour observation period was met the patient was alert and oriented and able to exit without assistance. She tends to have lingering sedative effects but not severe. .  See nursing note for further details. Per protocol will continue Spravato to 84 mg next session.  She has been okay since cutting back to once weekly.  We discussed the short-term risks associated with benzodiazepines including sedation and increased fall risk among others.  Discussed long-term side effect risk including dependence, potential withdrawal symptoms, and the potential eventual dose-related risk of  dementia.  But recent studies from 2020 dispute this association between benzodiazepines and dementia risk. Newer studies in 2020 do not support an association with dementia. For TR anxiety lorazepam to $RemoveBefo'2mg'tIWwdQOozRd$  AM and HS and 1 mg in afternoon.  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  Supportive therapy dealing with some of the recent stressors including family stressors and her own health related to cerebral palsy   Follow-up weekly .  Consider twice weekly.   Lynder Parents, MD, DFAPA  Please see After Visit Summary for patient specific instructions.  Future Appointments  Date Time Provider [place]  03/25/2022  1:00 PM Shelbie Hutching [facility]  05/09/2022 11:00 AM Blanchie Serve, PhD [facility] None  06/13/2022 11:00 AM Blanchie Serve, PhD [facility] None    No orders of the defined types were placed in this encounter.    -------------------------------

## 2022-03-23 NOTE — Progress Notes (Signed)
NURSE Visit:   Pt arrived for her weekly Spravato Treatment, she started Spravato treatments on 10/07/2021, she continues with 84 mg (3 of the 28 mg) Spravato nasal spray. which is the maintenance dose for her treatment resistant depression.   She was directed to the treatment room to get vitals taken first. Her B/P at 3:15 PM 131/97, 89, Pt instructed to blow her nose and to recline back at 45 degrees. Pt given first nasal spray (28 mg) administered by pt observed by nurse. There were 5 minutes between each dose, total of 84 mg. Tolerated well. Pt's medication is delivered by [facility] in [place] and stored inside a safe behind a locked door as well. Spravato is a CIII medication and has to be only given at a treatment facility and observed by nurse as pt administered intranasally.  Pt's 40 minute vital signs at 4:00 PM 116/76, 82. Dr. Clovis Pu met with pt to discuss her care at the end of her treatment when her thoughts are clearer and they discussed pt starting Auvelity 45-105 mg. She voiced having a headache and she had not eaten much today, given Tylenol at 4:50 pm and she also had snacks and water. She does go to the bathroom at least once during her treatment. No sedation and had slight feeling of being "high" she reports.  Discharge vitals at 5:15 PM 125/82, 84. Pt stable for discharge, pt is scheduled next Wednesday, June 14 th. Pt was observed on site a total of 120 minutes per FDA/REMS requirements. Pt was with nurse for clinical assessment a total of 55 minutes.    LOT [id number] EXP 2025 FEB

## 2022-03-24 ENCOUNTER — Other Ambulatory Visit: Payer: Self-pay

## 2022-03-24 MED ORDER — SPRAVATO (84 MG DOSE) 28 MG/DEVICE NA SOPK
PACK | NASAL | 3 refills | Status: DC
Start: 2022-03-24 — End: 2022-08-15

## 2022-03-25 ENCOUNTER — Ambulatory Visit (HOSPITAL_BASED_OUTPATIENT_CLINIC_OR_DEPARTMENT_OTHER): Payer: 59 | Admitting: Physical Therapy

## 2022-03-25 ENCOUNTER — Encounter (HOSPITAL_BASED_OUTPATIENT_CLINIC_OR_DEPARTMENT_OTHER): Payer: Self-pay | Admitting: Physical Therapy

## 2022-03-25 DIAGNOSIS — M6281 Muscle weakness (generalized): Secondary | ICD-10-CM

## 2022-03-25 DIAGNOSIS — R262 Difficulty in walking, not elsewhere classified: Secondary | ICD-10-CM

## 2022-03-25 DIAGNOSIS — M5459 Other low back pain: Secondary | ICD-10-CM

## 2022-03-25 DIAGNOSIS — R2689 Other abnormalities of gait and mobility: Secondary | ICD-10-CM

## 2022-03-25 NOTE — Therapy (Signed)
OUTPATIENT PHYSICAL THERAPY TREATMENT NOTE   Patient Name: Casey Diaz MRN: [id number] DOB:Dec 23, 1965, 56 y.o., female Today's Date: 03/25/2022  PCP: Shon Baton, MD REFERRING PROVIDER: Shon Baton, MD   PT End of Session - 03/25/22 1307     Visit Number 18    Number of Visits 22    Date for PT Re-Evaluation 04/22/22    PT Start Time 1300    PT Stop Time 1340    PT Time Calculation (min) 40 min    Activity Tolerance Patient tolerated treatment well    Behavior During Therapy [facility] for tasks assessed/performed                Past Medical History:  Diagnosis Date   Abnormal Pap smear 2011   hpv/mild dysplasia,cin1   Anxiety    Cerebral palsy ([place])    right arm/leg   Cystocele    Depression    Headache    Neuromuscular disorder (HCC)    Cerebral Palsy   OCD (obsessive compulsive disorder)    Osteoporosis    Uterine prolaps    Past Surgical History:  Procedure Laterality Date   COLPOSCOPY  2011   ELBOW SURGERY     left elbow-separation of bones -age 42   ELBOW SURGERY  2015   fell on ice- right elbow fracture   FRACTURE SURGERY     HAMMER TOE SURGERY  1/13   RIGHT SIDE   HIP PINNING,CANNULATED Left 07/08/2016   Procedure: LEFT CLOSED REDUCTION HIP AND PERCUTANEOUS SCREW;  Surgeon: Paralee Cancel, MD;  Location: [facility];  Service: Orthopedics;  Laterality: Left;   ORIF HUMERUS FRACTURE Right 06/11/2021   Procedure: OPEN REDUCTION INTERNAL FIXATION (ORIF) DISTAL HUMERUS FRACTURE;  Surgeon: Altamese Garnett, MD;  Location: [place];  Service: Orthopedics;  Laterality: Right;   WRIST SURGERY  2005   left wrist   Patient Active Problem List   Diagnosis Date Noted   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[place]) 08/03/2018   Relationship problem with family member 08/03/2018   Femoral neck fracture, left, closed, initial encounter 07/08/2016   Severe recurrent major depression without psychotic features ([place]) 06/03/2015    Class:  Chronic   Obsessive-compulsive disorder 06/03/2015    Class: Chronic    REFERRING DIAG: M54.51 (ICD-10-CM) - Vertebrogenic low back pain   THERAPY DIAG:  Muscle weakness (generalized)  Difficulty in walking, not elsewhere classified  Other abnormalities of gait and mobility  Arthralgia of lumbar spine  PERTINENT HISTORY: CP OPEN REDUCTION INTERNAL FIXATION (ORIF) DISTAL HUMERUS FRACTURE IM nail Left hip 2017 Osteoporosis  PRECAUTIONS: fall  SUBJECTIVE:  Pt reports her Rt ankle has been bothering her lately.  She has been wearing R lower leg/ankle brace she was issued for the last 2 days (2-3 hrs).  She states she needs to get used to it.   PAIN:  Are you having pain? Yes NPRS scale: 5/10 current ankle Pain location:  Rt ankle PAIN TYPE: aching, sore Pain description: constant  Aggravating factors: walking  Relieving factors: rest    PRECAUTIONS: Fall   WEIGHT BEARING RESTRICTIONS No   FALLS:  Has patient fallen in last 6 months? No, Number of falls: 1 in Aug 2022   LIVING ENVIRONMENT: Lives with: lives with their family Lives in: House/apartment Stairs: Yes; Internal: 15 steps; none Has following equipment at home: None     PLOF: Independent with household mobility without device   PATIENT GOALS to get stronger, more  coordinated and better balanced.     OBJECTIVE: * Findings taken at [facility] unless otherwise noted.      PATIENT SURVEYS:  FOTO 44 with goal of 50 01/20/22: 53     COGNITION:          Overall cognitive status: Within functional limits for tasks assessed                        SENSATION:          intact   MUSCLE LENGTH: Hamstrings: Right 70 deg; Left 90 deg Thomas test: Right neg deg; Left neg deg   POSTURE:   left shoulder elevation, weightbearing shifted left   PALPATION: Tenderness bilat sub occipital and upper traps   LUMBARAROM/PROM   A/PROM A/PROM  12/15/2021  Flexion Forward reach 4 inches to floor  Extension    Right  lateral flexion    Left lateral flexion    Right rotation    Left rotation     (Blank rows = not tested)   LE AROM/PROM:   A/PROM Right 12/15/2021 Left 12/15/2021 Right 01/20/22 Left 01/20/22 Right/Left 03/18/22  Hip flexion 90 105   105/114  Hip extension         Hip abduction _0 35   Hip adduction         Hip internal rotation         Hip external rotation         Knee flexion 105 120     Knee extension         Ankle dorsiflexion 5       Ankle plantarflexion 10       Ankle inversion         Ankle eversion          (Blank rows = not tested)   LE MMT:   MMT Right 12/15/2021 Left 12/15/2021  Hip flexion 5 4  Hip extension 4 4  Hip abduction 5 5  Hip adduction 5 5  Hip internal rotation      Hip external rotation      Knee flexion 5 5  Knee extension      Ankle dorsiflexion      Ankle plantarflexion      Ankle inversion      Ankle eversion       (Blank rows = not tested)   LUMBAR SPECIAL TESTS:  Straight leg raise test: Negative, Slump test: Negative, and Thomas test: Negative   FUNCTIONAL TESTS:  5 times sit to stand: 20 Timed up and go (TUG): 13 Berg Balance Scale: 36/56  03/18/22: 41/56  02/01/22:  5x STS  = 12.09 sec (no UE support)   GAIT: Distance walked: 562f Assistive device utilized: None Level of assistance: Complete Independence Comments: right trendelenburg, left leaning, no heel strike or knee extension right, shortened step length         TODAY'S TREATMENT  Pt seen for aquatic therapy today.  Treatment took place in water 3.25-4.8 ft [facility] Temp of water was 91.  Pt entered/exited the pool via stairs (step to pattern) independently with single rail.  Warm up: forward / backward walking 1 lap Seated on yellow noodle: Cycling -varied speeds / scissors/ CC ski for aerobic capacity Toe taps to 1st, 2nd step and return to neutral, no UE support x ~5 each leg.   Forward step up/retro step down-2nd step x 5 reps  each leg, with UE on rails x 2 sets;    Rt lateral step ups with UE on rail x 9 Seated on 4th step from bottom: STS x10 Cues for immediate standing balance. (5th step too difficult) Walking forward lunges with rainbow hand buoys on surface  Throwing orange weighted ball at wall x 10 Side stepping 1 lap for recovery Holding yellow noodle:  SLS with hip circles (knee flexed) ; hip/knee flex crossing midline;  forward and side kicks with knee flexed to straight     Pt requires buoyancy for support and to offload joints with strengthening exercises. Viscosity of the water is needed for resistance of strengthening; water current perturbations provides challenge to standing balance unsupported, requiring increased core activation.  PATIENT EDUCATION:  Education details: Condition management;  progression of exercise Person educated: Patient Education method: Explanation Education comprehension: verbalized understanding     HOME EXERCISE PROGRAM: Access Code: [facility] URL: [email or web address] Date: 12/15/2021 Prepared by: Denton Meek   Exercises Supine Lower Trunk Rotation - 1 x daily - 7 x weekly - 3 sets - 10 reps Supine Single Knee to Chest Stretch - 1 x daily - 7 x weekly - 3 sets - 10 reps   ASSESSMENT:   CLINICAL IMPRESSION: Pt was challenged with lateral step ups and forward step ups with increased step height.  Balance was challenged with walking lunges.  No increase in pain during session.  She will continue to benefit from skilled aquatic PT with decreased frequency allowing pt to demonstrate and be indep with developing aquatic HEP.    OBJECTIVE IMPAIRMENTS Abnormal gait, decreased activity tolerance, decreased balance, decreased coordination, decreased mobility, difficulty walking, decreased strength, and pain.    ACTIVITY LIMITATIONS cleaning, community activity, yard work, and shopping.    PERSONAL FACTORS Time since onset of  injury/illness/exacerbation and 3+ comorbidities:    are also affecting patient's functional outcome.      REHAB POTENTIAL: Good   CLINICAL DECISION MAKING: Evolving/moderate complexity   EVALUATION COMPLEXITY: Moderate     GOALS: Goals reviewed with patient? Yes   SHORT TERM GOALS:   STG Name Target Date Goal status  1 Pt will tolerate entire sessions of aquatic therapy without increase in cervical or LBP Baseline:  01/05/2022  Achieved 01/20/22  2 Pt will improve bilat hip abduction by 5d Baseline: R 25d; L30d 01/05/2022  Partially met 01/20/22  3 Pt will improve bilat hip flex to 110d Baseline:R 90d; L 105d 7/723  Ongoing 01/20/22    LONG TERM GOALS:    LTG Name Target Date Goal status  1 Pt will improve on FOTO  to 50 to demonstrate improved functional status Baseline:44 01/26/2022  Achieved 01/20/22  2 Pt will improve on Berg Balance Test to >or= 46/56 to demonstrate improvement in balance and decreased fall risk Baseline:36/56 7/723  Ongoing   3 Pt will improve on 5 X STS test to <or= 15 to demonstrate improvement in LE strength Baseline:12.09 seconds 02/01/22 03/03/2022 Achieved 02/01/22    4 Pt will decrease overall pain in cervical spine and LB to 4/10 or< for improved toleration to activity Baseline: 04/22/22  Ongoing   5   Pt to be indep with final aquatic HEP 04/22/22  NEW          PLAN: PT FREQUENCY: 1/week   PT DURATION: 5 weeks   PLANNED INTERVENTIONS: Therapeutic exercises, Therapeutic activity, Neuromuscular re-education, Balance training, Gait training, Patient/Family education, Joint manipulation, Joint mobilization, Stair training, Aquatic Therapy, Taping, and  Manual therapy   PLAN FOR NEXT SESSION: balance: issue laminated HEP in upcoming visit.   Kerin Perna, PTA 03/25/22 1:50 PM

## 2022-03-29 ENCOUNTER — Ambulatory Visit (HOSPITAL_BASED_OUTPATIENT_CLINIC_OR_DEPARTMENT_OTHER): Payer: 59 | Admitting: Physical Therapy

## 2022-03-29 ENCOUNTER — Encounter (HOSPITAL_BASED_OUTPATIENT_CLINIC_OR_DEPARTMENT_OTHER): Payer: Self-pay | Admitting: Physical Therapy

## 2022-03-29 DIAGNOSIS — R2689 Other abnormalities of gait and mobility: Secondary | ICD-10-CM

## 2022-03-29 DIAGNOSIS — R262 Difficulty in walking, not elsewhere classified: Secondary | ICD-10-CM

## 2022-03-29 DIAGNOSIS — M6281 Muscle weakness (generalized): Secondary | ICD-10-CM

## 2022-03-29 DIAGNOSIS — M5459 Other low back pain: Secondary | ICD-10-CM

## 2022-03-29 NOTE — Therapy (Signed)
OUTPATIENT PHYSICAL THERAPY TREATMENT NOTE   Patient Name: Casey Diaz MRN: [id number] DOB:24-Feb-1966, 56 y.o., female Today's Date: 03/29/2022  PCP: Shon Baton, MD REFERRING PROVIDER: Shon Baton, MD   PT End of Session - 03/29/22 1137     Visit Number 19    Number of Visits 22    Date for PT Re-Evaluation 04/22/22    PT Start Time 1135   pt late   PT Stop Time 1214    PT Time Calculation (min) 39 min    Activity Tolerance Patient tolerated treatment well    Behavior During Therapy [facility] for tasks assessed/performed                Past Medical History:  Diagnosis Date   Abnormal Pap smear 2011   hpv/mild dysplasia,cin1   Anxiety    Cerebral palsy ([place])    right arm/leg   Cystocele    Depression    Headache    Neuromuscular disorder (HCC)    Cerebral Palsy   OCD (obsessive compulsive disorder)    Osteoporosis    Uterine prolaps    Past Surgical History:  Procedure Laterality Date   COLPOSCOPY  2011   ELBOW SURGERY     left elbow-separation of bones -age 12   ELBOW SURGERY  2015   fell on ice- right elbow fracture   FRACTURE SURGERY     HAMMER TOE SURGERY  1/13   RIGHT SIDE   HIP PINNING,CANNULATED Left 07/08/2016   Procedure: LEFT CLOSED REDUCTION HIP AND PERCUTANEOUS SCREW;  Surgeon: Paralee Cancel, MD;  Location: [facility];  Service: Orthopedics;  Laterality: Left;   ORIF HUMERUS FRACTURE Right 06/11/2021   Procedure: OPEN REDUCTION INTERNAL FIXATION (ORIF) DISTAL HUMERUS FRACTURE;  Surgeon: Altamese Ogemaw, MD;  Location: [place];  Service: Orthopedics;  Laterality: Right;   WRIST SURGERY  2005   left wrist   Patient Active Problem List   Diagnosis Date Noted   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[place]) 08/03/2018   Relationship problem with family member 08/03/2018   Femoral neck fracture, left, closed, initial encounter 07/08/2016   Severe recurrent major depression without psychotic features ([place]) 06/03/2015     Class: Chronic   Obsessive-compulsive disorder 06/03/2015    Class: Chronic    REFERRING DIAG: M54.51 (ICD-10-CM) - Vertebrogenic low back pain   THERAPY DIAG:  Muscle weakness (generalized)  Difficulty in walking, not elsewhere classified  Other abnormalities of gait and mobility  Arthralgia of lumbar spine  PERTINENT HISTORY: CP OPEN REDUCTION INTERNAL FIXATION (ORIF) DISTAL HUMERUS FRACTURE IM nail Left hip 2017 Osteoporosis  PRECAUTIONS: fall  SUBJECTIVE: "Ankle continues to be uncomfortable.  I feel like I need to work on my core more" PAIN:  Are you having pain? Yes NPRS scale: 4/10 current ankle Pain location:  Rt ankle PAIN TYPE: aching, sore Pain description: constant  Aggravating factors: walking  Relieving factors: rest    PRECAUTIONS: Fall   WEIGHT BEARING RESTRICTIONS No   FALLS:  Has patient fallen in last 6 months? No, Number of falls: 1 in Aug 2022   LIVING ENVIRONMENT: Lives with: lives with their family Lives in: House/apartment Stairs: Yes; Internal: 15 steps; none Has following equipment at home: None     PLOF: Independent with household mobility without device   PATIENT GOALS to get stronger, more coordinated and better balanced.     OBJECTIVE: * Findings taken at [facility] unless otherwise noted.  PATIENT SURVEYS:  FOTO 44 with goal of 50 01/20/22: 53     COGNITION:          Overall cognitive status: Within functional limits for tasks assessed                        SENSATION:          intact   MUSCLE LENGTH: Hamstrings: Right 70 deg; Left 90 deg Thomas test: Right neg deg; Left neg deg   POSTURE:   left shoulder elevation, weightbearing shifted left   PALPATION: Tenderness bilat sub occipital and upper traps   LUMBARAROM/PROM   A/PROM A/PROM  12/15/2021  Flexion Forward reach 4 inches to floor  Extension    Right lateral flexion    Left lateral flexion    Right rotation    Left rotation     (Blank rows = not  tested)   LE AROM/PROM:   A/PROM Right 12/15/2021 Left 12/15/2021 Right 01/20/22 Left 01/20/22 Right/Left 03/18/22  Hip flexion 90 105   105/114  Hip extension         Hip abduction _0 35   Hip adduction         Hip internal rotation         Hip external rotation         Knee flexion 105 120     Knee extension         Ankle dorsiflexion 5       Ankle plantarflexion 10       Ankle inversion         Ankle eversion          (Blank rows = not tested)   LE MMT:   MMT Right 12/15/2021 Left 12/15/2021  Hip flexion 5 4  Hip extension 4 4  Hip abduction 5 5  Hip adduction 5 5  Hip internal rotation      Hip external rotation      Knee flexion 5 5  Knee extension      Ankle dorsiflexion      Ankle plantarflexion      Ankle inversion      Ankle eversion       (Blank rows = not tested)   LUMBAR SPECIAL TESTS:  Straight leg raise test: Negative, Slump test: Negative, and Thomas test: Negative   FUNCTIONAL TESTS:  5 times sit to stand: 20 Timed up and go (TUG): 13 Berg Balance Scale: 36/56  03/18/22: 41/56  02/01/22:  5x STS  = 12.09 sec (no UE support)   GAIT: Distance walked: 559f Assistive device utilized: None Level of assistance: Complete Independence Comments: right trendelenburg, left leaning, no heel strike or knee extension right, shortened step length         TODAY'S TREATMENT  Pt seen for aquatic therapy today.  Treatment took place in water 3.25-4.8 ft [facility] Temp of water was 91.  Pt entered/exited the pool via stairs (step to pattern) independently with single rail.  Warm up: forward / backward walking 1 lap Toe taps to  2nd step and return to neutral, no UE support x 10 each leg.    Seated on 4th step from bottom: STS x10. 5th step x10 cues for technique using momentum and UE to mimic getting out of beach chair). 6th sep x5 Forward step up/retro step down bottom step step x 10 reps each leg no ue support R/L  lateral  step ups with UE on rail x 12 R/L Holding yellow noodle:  SLS with hip circles (knee flexed) .  Pt with difficulty maintaining balance Walking between exercises for recovery  Pt requires buoyancy for support and to offload joints with strengthening exercises. Viscosity of the water is needed for resistance of strengthening; water current perturbations provides challenge to standing balance unsupported, requiring increased core activation.  PATIENT EDUCATION:  Education details: Condition management;  progression of exercise Person educated: Patient Education method: Explanation Education comprehension: verbalized understanding     HOME EXERCISE PROGRAM: Access Code: [facility] URL: [email or web address] Date: 12/15/2021 Prepared by: Denton Meek   Exercises Supine Lower Trunk Rotation - 1 x daily - 7 x weekly - 3 sets - 10 reps Supine Single Knee to Chest Stretch - 1 x daily - 7 x weekly - 3 sets - 10 reps   ASSESSMENT:   CLINICAL IMPRESSION: Pt  late for appointment able to keep her a little late. Pt with concerns about being unable to get out of a beach chair and on her way to vacation next week.  Pt instruction on using momentum and ue to rise. She complete STS from top step rising 4/5 times improving technique with each rep. Goals ongoing.      OBJECTIVE IMPAIRMENTS Abnormal gait, decreased activity tolerance, decreased balance, decreased coordination, decreased mobility, difficulty walking, decreased strength, and pain.    ACTIVITY LIMITATIONS cleaning, community activity, yard work, and shopping.    PERSONAL FACTORS Time since onset of injury/illness/exacerbation and 3+ comorbidities:    are also affecting patient's functional outcome.      REHAB POTENTIAL: Good   CLINICAL DECISION MAKING: Evolving/moderate complexity   EVALUATION COMPLEXITY: Moderate     GOALS: Goals reviewed with patient? Yes   SHORT TERM GOALS:   STG Name Target Date Goal  status  1 Pt will tolerate entire sessions of aquatic therapy without increase in cervical or LBP Baseline:  01/05/2022  Achieved 01/20/22  2 Pt will improve bilat hip abduction by 5d Baseline: R 25d; L30d 01/05/2022  Partially met 01/20/22  3 Pt will improve bilat hip flex to 110d Baseline:R 90d; L 105d 7/723  Ongoing 01/20/22    LONG TERM GOALS:    LTG Name Target Date Goal status  1 Pt will improve on FOTO  to 50 to demonstrate improved functional status Baseline:44 01/26/2022  Achieved 01/20/22  2 Pt will improve on Berg Balance Test to >or= 46/56 to demonstrate improvement in balance and decreased fall risk Baseline:36/56 7/723  Ongoing   3 Pt will improve on 5 X STS test to <or= 15 to demonstrate improvement in LE strength Baseline:12.09 seconds 02/01/22 03/03/2022 Achieved 02/01/22    4 Pt will decrease overall pain in cervical spine and LB to 4/10 or< for improved toleration to activity Baseline: 04/22/22  Ongoing   5   Pt to be indep with final aquatic HEP 04/22/22  NEW          PLAN: PT FREQUENCY: 1/week   PT DURATION: 5 weeks   PLANNED INTERVENTIONS: Therapeutic exercises, Therapeutic activity, Neuromuscular re-education, Balance training, Gait training, Patient/Family education, Joint manipulation, Joint mobilization, Stair training, Aquatic Therapy, Taping, and Manual therapy   PLAN FOR NEXT SESSION: balance: issue laminated HEP in upcoming visit.   Casey Diaz MPT 03/29/22 12:33 PM

## 2022-03-30 ENCOUNTER — Encounter: Payer: Self-pay | Admitting: Psychiatry

## 2022-03-30 ENCOUNTER — Ambulatory Visit (INDEPENDENT_AMBULATORY_CARE_PROVIDER_SITE_OTHER): Payer: 59 | Admitting: Psychiatry

## 2022-03-30 ENCOUNTER — Ambulatory Visit: Payer: 59

## 2022-03-30 DIAGNOSIS — F5105 Insomnia due to other mental disorder: Secondary | ICD-10-CM | POA: Diagnosis not present

## 2022-03-30 DIAGNOSIS — G809 Cerebral palsy, unspecified: Secondary | ICD-10-CM

## 2022-03-30 DIAGNOSIS — F339 Major depressive disorder, recurrent, unspecified: Secondary | ICD-10-CM

## 2022-03-30 DIAGNOSIS — F4010 Social phobia, unspecified: Secondary | ICD-10-CM | POA: Diagnosis not present

## 2022-03-30 DIAGNOSIS — F422 Mixed obsessional thoughts and acts: Secondary | ICD-10-CM | POA: Diagnosis not present

## 2022-03-30 NOTE — Progress Notes (Signed)
Casey Diaz [id number] July 10, 1966 56 y.o.    Subjective:   Patient ID:  Casey Diaz is a 56 y.o. (DOB January 21, 1966) female.  Chief Complaint:  Chief Complaint  Patient presents with   Follow-up   Depression   Anxiety     HPI Casey Diaz presents to the office today for follow-up of OCD and severe anxiety.     December 2019 visit the following was noted: No meds were changed. Lives in [place] and back for followup.  Sx are about the same.  Has to take meds with different sizes. Pt reports that mood is Anxious and Depressed and describes anxiety as Severe. Anxiety symptoms include: Excessive Worry, Obsessive Compulsive Symptoms:   Checking,,. Pt reports has interrupted sleep and nocturia. Pt reports that appetite is good. Pt reports that energy is no change and down slightly. Concentration is down slightly. Suicidal thoughts:  denied by patient. Loves the environment of [place] but misses some things there.  She's not able to work there.  H works there and likes it.  Struggled with not working, feels isolated and not up to task of meeting people.  Does attend a church and met a friend who's been helpful.  Leaving for [place] on 10/16/18.   04/09/2020 appointment the following is noted:  Staying another year in [place] bc Covid and other things. Last few months a lot of crying spells.  Is in menopause. Wonders about med changes though is nervous about it.  Crying spells associated with depressing thoughts more than stress or OCD.   Covid really hard on everyone and couldn't see family for 18 mos.  Family still very dysfunctional. No close friends in part due to OCD and depression. Son high Autism spectrum with ADHD and anxiety and she's with him all the time. Greater health problems with CP so more pains.   05/15/20 appt with the following noted: [name] for menopause and helps some. Still depressed.  Chronically. In [place] for 2 more weeks then to [place] for another  year. A lot of stressors lately triggering more checking and anxiety.   OCD is her CC now and seems.  Got worse DT stress.   Stressed with Asberger's son and her health.  H works a lot.  Her FOO still stress. Plan: Trintellix 10 mg 1 tablet in the morning with food and reduce fluvoxamine to 5 tablets nightly for 1 week  then reduce it to 4 tablets nightly.   07/02/20 appt with the following noted: Decided not to get Trintellix bc difficulty getting it. It is available.  There.  Wants to start it now.   Both depression and OCD are severe.  Not suicidal in intent or plan. Did not take samples with her to [place] but will be back in December. covid is worse there and travel is difficult.  Wants to reduce Wellbutrin DT dry mouth. Plan: She's afraid to reduce Luvox at this time DT fear of worsening OCD.  But will consider. Trintellix 10 mg 1 tablet in the morning with food and reduce fluvoxamine to 5 tablets nightly for 1 week  then reduce it to 4 tablets nightly. Also reduce Wellbutrin XL to 300 mg daily.    9-13 2022 appointment with the following noted: Back in [place] since July 14.  Broke arm a month ago and surgery.  It's all been rough adjustment.   B has cancer on his face and M fell taking him to the doctor.  Misses the water  and weather of [place].   Cry a lot more since menopause. Still depression and anxiety and OCD.  Asks about ketamine. On Wellbutrin 300, Luvox 300.  No Trintellix. Added Ativan 2 mg AM and HS and it helps.  More likely to get upset at night. Plan: Increase Luvox back to 400 mg daily.  She thinks she's worse on less. Continue Wellbutrin XL to 300 mg daily. 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  She tolerated it well without unusual nausea or vomiting headache or other somatic symptoms.  She did have the expected dissociation which gradually resolved over the course  of the 2-hour period of observation.  She was a little concerned about her balance given her cerebral palsy but has not noted unusual or unexpected problems.  She is motivated to can continue Spravato in hopes of reducing her depressive symptoms. 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  She also has treatment resistant OCD which is partially managed with medications but is still quite disabling.  She is tolerating the medications well.  She is sleeping adequately.  Her appetite is adequate.  She is not having suicidal thoughts.  She continues to wish for a better treatment for OCD that would give her some relief.  09/30/2021 appointment with the following noted: She received her first dose of Spravato 84 mg intranasally today.  She tolerated it well without unusual nausea, vomiting, or other somatic symptoms.  Dissociation as expected did occur and gradually resolved over the 2-hour period of observation.  She did have a mild headache today with the treatment and received ibuprofen 600 mg at her request.  We will follow this to see if it is a pattern Patient is still depressed.  She said she was late with her medicine today and today was a particularly depressing day.  However she notes that the Spravato has lifted her mood considerably even today.  She is hopeful that it will continue to be helpful.  No suicidal thoughts.  She has ongoing chronic anxiety and OCD at baseline.  10/04/21 appt noted: Patient received Spravato 84 mg for the second time today.  She tolerated it well without any unusual headache, nausea or vomiting or other somatic symptoms.  Dissociation did occur and she gradually [employer] resolution over the 2-hour period of observation. She did not have any unusual problems after she left the office last Spravato administration.  She did not have any specific problems with balance or walking.  She is at increased risk of that difficulty because of cerebral  palsy.  So far she has not noticed much mood effect from the medication beyond the first day of receiving it.  However she would like to continue Spravato in hopes of getting the antidepressant effect that is desired. Stress dealing with mother's behavior at party pt hosted.  Guilt over it.  10/07/2021 appointment noted: Patient received Spravato 84 mg for the second time today.  She tolerated it well without any unusual headache, nausea or vomiting or other somatic symptoms.  Dissociation did occur and she gradually Kelley resolution over the 2-hour period of observation. She still is not sure about the antidepressant effect of Spravato.  Events over the holidays and demands, make it difficult to assess.  She still notes that the OCD tends to worsen the depression and vice versa.  She tolerates the Spravato well and  wants to continue the trial.  10/15/2021 appointment with the following noted: Patient received Spravato 84 mg for the second time today.  She tolerated it well without any unusual headache, nausea or vomiting or other somatic symptoms.  Dissociation did occur and she gradually [place] resolution over the 2-hour period of observation. Patient says it was somewhat difficult to evaluate the effect of the Spravato.  It was scheduled to be twice weekly for 4 weeks consecutively but the holidays have interfered with that administration.  She asked what specifically should be she should be looking for in order to assess improvement.  That was discussed.  The OCD is unchanged and the depression so far is not significantly different.  She still tolerates meds.  There have been no recent med changes  10/19/2021 appt noted: Patient received Spravato 84 mg for the second today.  She tolerated it well without any unusual headache, nausea or vomiting or other somatic symptoms.  Dissociation did occur and she gradually saw resolution over the 2-hour period of observation.   10/21/2021 appointment noted: Patient  received Spravato 84 mg today.  She tolerated it well without any unusual headache, nausea or vomiting or other somatic symptoms.  Dissociation did occur and she gradually saw resolution over the 2-hour period of observation.  She feels better than last week.  She is not as depressed and down.  She is still dealing with grief around the death of her cousin that was unexpected.  It is still difficult to tell how much the Spravato was doing but she is hopeful.  Anxiety is still present with the OCD.  She is not having suicidal thoughts.  She is not hopeless.  She wants to continue treatment.  10/25/2021 appointment with the following noted: Patient received Spravato 84 mg today.  She tolerated it well without any unusual headache, nausea or vomiting or other somatic symptoms.  Dissociation did occur and she gradually saw resolution over the 2-hour period of observation.  She does not typically find the dissociation very strong. She is beginning to think the Spravato is helping somewhat with the depression.  It has been difficult to tell with the holidays intervening as well as the death of her cousin.  She has not been able to get Spravato twice weekly for 4 weeks straight as typically planned.  However she is hopeful.  The OCD remains significant.  She still has a tendency to think very negatively.  She is not suicidal.  10/28/2021 appointment with the following noted: Patient received Spravato 84 mg today.  She tolerated it well without any unusual headache, nausea or vomiting or other somatic symptoms.  Dissociation did occur and she gradually saw resolution over the 2-hour period of observation.  She does not typically find the dissociation very strong. She is feeling more hopeful about the administration of Spravato.  She is having less depression she believes.  Still not dramatically different.  She still has a tendency to have a lot of anxiety and rumination and OCD.  She is not suicidal.  She is eager to  continue the Spravato.  11/01/2021 appointment with the following noted: Patient received Spravato 84 mg today.  She tolerated it well without any unusual headache, nausea or vomiting or other somatic symptoms.  Dissociation did occur and she gradually saw resolution over the 2-hour period of observation.  She does not typically find the dissociation very strong. She is continuing to see a little bit of improvement in depression with Spravato.  The anxiety remains but may be not as severe.  The OCD remains markedly severe chronically.  She is not suicidal.  She is encouraged by the degree of improvement with Spravato and inability to enjoy things more and not be quite as ruminative.  11/04/2021 appt noted: Patient received Spravato 84 mg today.  She tolerated it well without any unusual headache, nausea or vomiting or other somatic symptoms.  Dissociation did occur and she gradually saw resolution over the 2-hour period of observation.  She does not typically find the dissociation very strong. No SE complaints with meds. She continues to feel hopeful about the Spravato.  She has less depression.  Because of a number of factors she is uncertain of the full benefit but thinks she is somewhat less depressed.  Her anxiety and OCD remain significant but a little better.  She is tolerating the medications and does not desire medicine change.  She is not currently complaining of insomnia.   11/08/2021 appointment the following noted: Patient received Spravato 84 mg today.  She tolerated it well without any unusual headache, nausea or vomiting or other somatic symptoms.  Dissociation did occur and she gradually saw resolution over the 2-hour period of observation.  She does not typically find the dissociation very strong. No SE complaints with meds. She feels the Spravato is helping somewhat.  She would like to see a greater effect.  However she is able to enjoy things.  She is productive at home.  She would like  to see a lifting of a degree of sadness that remains.  The anxiety and OCD remained largely unchanged.  She wondered about the dosing of Wellbutrin 300 mg a day and Luvox 300 mg a day and possible increases.  She has been at higher doses in the past.  She plans to start water therapy for her weakness and for her shoulder.  11/11/2021 appointment with the following noted: Patient received Spravato 84 mg today.  She tolerated it well without any unusual headache, nausea or vomiting or other somatic symptoms.  Dissociation did occur and she gradually saw resolution over the 2-hour period of observation.  She does not typically find the dissociation very strong. No SE complaints with meds. She feels the Spravato is clearly helping the depression.  She would like to see a more significant effect.  She is still having trouble thinking positive. Her energy is fair.  Concentration is good except for the problem with chronic obsessions. She has been taking Wellbutrin 300 mg in Luvox 300 mg for quite some time but has taken higher doses in the past.  We discussed that.  She would like to try higher doses in order to get a better effect if possible. We just increased the doses a couple of days ago.  No effect yet.  11/15/2021 appointment with the following noted: Patient received Spravato 84 mg today.  She tolerated it well without any unusual headache, nausea or vomiting or other somatic symptoms.  Dissociation did occur and she gradually saw resolution over the 2-hour period of observation.  She does not typically find the dissociation very strong. No SE complaints with meds. The patient is now convinced that the Spravato is helping the depression.  She would like to continue twice weekly Spravato this week if possible.  She has tolerated the increase in Wellbutrin to 450 mg daily and the increase and fluvoxamine to 400 mg daily without complications thus far.  The OCD and anxiety feed the depression to  some  extent. She spends approximately 2 hours daily with checking compulsions due to obsessions about causing harm to others.  For example fearing that when she has hit a pot hole that she may have hit a person and going back to check.  Checking corners and rooms out of fear that she may have harmed someone.  Other various checking compulsions.  She is hoping the increase in fluvoxamine to 400 mg will reduce that over the weeks to come.  She is not seeing a significant difference with the addition of the Spravato though she understands that was not expected.  She is more productive at home and more motivated and able to enjoy things more fully as a result of the Spravato treatment.  She is tolerating the medication  11/18/2021 appointment with the following noted: Patient received Spravato 84 mg today.  She tolerated it well without any unusual headache, nausea or vomiting or other somatic symptoms.  Dissociation did occur and she gradually saw resolution over the 2-hour period of observation.  She does not typically find the dissociation very strong. No SE complaints with meds. She clearly believes the Spravato has been helpful for the depression.  She wonders whether to continue to treatments weekly or to cut back to 1 weekly.  She would like to continue twice weekly in hopes of getting additional improvement in the depression because it is not resolved but it is difficult to get here twice a week in terms of arranging rides. She is recently increased Wellbutrin XL to 450 mg daily and fluvoxamine to 400 mg daily but they have not had time to have an official effect.  She is tolerating that well.  She is tolerating meds overwork overall well. The OCD remains the same as noted on 11/15/2021  11/25/21 appt noted: Patient received Spravato 84 mg today.  She tolerated it well without any unusual headache, nausea or vomiting or other somatic symptoms.  Dissociation did occur and she gradually saw resolution over the  2-hour period of observation.  She does not typically find the dissociation very strong. No SE complaints with meds. She thinks the increase in Wellbutrin and Luvox have been potentially helpful for depression and OCD respectively.  It has been too early to see the full effect.  She is sleeping and eating well.  She is functioning at home.  She still spends a lot of time that is about 2 hours a day dealing with compulsive behaviors.  12/02/21 appt noted: Patient received Spravato 84 mg today.  She tolerated it well without any unusual headache, nausea or vomiting or other somatic symptoms.  Dissociation did occur and she gradually saw resolution over the 2-hour period of observation.  She does not typically find the dissociation very strong. No SE complaints with meds. Several losses and stressors recently that affect her sense of mood. However still sees significant benefit from the Spravato for her depression.  Wants to continue it. Suspect early  some benefit from the increased Wellbutrin for depression and Luvox for OCD. Tolerating meds. No complaints about the meds. Sleeping and eating well.  No new health concerns.  12/09/21 appt noted: Patient received Spravato 84 mg today.  She tolerated it well without any unusual headache, nausea or vomiting or other somatic symptoms.  Dissociation did occur and she gradually saw resolution over the 2-hour period of observation.  She does not typically find the dissociation very strong. No SE complaints with meds. Seeing noticeable improvement from increase fluvoxamine to  400 mg daily.  Tolerating meds without concerns over them. Depression is stable with residual sx of easy guilt and easily stressed.  OCD contributes to depression but depression is not severe with less crying spells.  Productive at home with chores.  Enjoyed recent birthday.  Sleeping good. No new concerns.  12/23/2021 appointment noted: Patient received Spravato 84 mg today.  She  tolerated it well without any unusual headache, nausea or vomiting or other somatic symptoms.  Dissociation did occur and she gradually saw resolution over the 2-hour period of observation.  She does not typically find the dissociation very strong. No SE complaints with meds. Seeing noticeable improvement from increase fluvoxamine to 400 mg daily.  Tolerating meds without concerns over them. Her depression is somewhat improved with the Spravato.  She also feels generally a little lighter.  She is more motivated.  She is less overwhelmed by guilt.  The OCD is gradually improving but is still quite time-consuming as noted before.  She is sleeping well.  No side effects  12/30/2021 appointment with the following noted: Patient received Spravato 84 mg today.  She tolerated it well without any unusual headache, nausea or vomiting or other somatic symptoms.  Dissociation did occur and she gradually saw resolution over the 2-hour period of observation.  She does not typically find the dissociation very strong. No SE complaints with meds. Seeing noticeable improvement from increase fluvoxamine to 400 mg daily.  Tolerating meds without concerns over them. She is confident of her the improvement seen with Spravato.  She is less hopeless.  Guilt is marked remarkably improved.  She is not having any thoughts of death or dying.  She is more motivated for activities such as exercise which she is recently started.  She is sleeping well. The OCD remains severe but it is improving somewhat with the increase in fluvoxamine.  It is still consuming a couple hours per day.  01/10/22 apravato 84 admin  01/24/22 appt noted: Patient received Spravato 84 mg today.  She tolerated it well without any unusual headache, nausea or vomiting or other somatic symptoms.  Dissociation did occur and she gradually saw resolution over the 2-hour period of observation.  She does not typically find the dissociation very strong. No SE  complaints with meds. Very tearful today.  Feels like she has been suppressing emotion in the Spravato caused it to be released.  Discussed some stressors.  Overall still feels the medicine is helpful.  She has missed some of the scheduled Spravato treatments that were intended to be weekly due to circumstances beyond her control.  She is still struggling with OCD as previously noted but does believe the medications are helpful. Plan no med changes  01/31/2022 received Spravato 84 mg today  02/09/2022 appointment with the following noted: Patient received Spravato 84 mg today.  She tolerated it well without any unusual headache, nausea or vomiting or other somatic symptoms.  Dissociation did occur and she gradually saw resolution over the 2-hour period of observation.  She does not typically find the dissociation very strong. No SE complaints with meds. Spravato clearly helps depression and OCD but easily gets overwhelmed and tearful with fairly routine stressors.  Tolerating meds. Sleep and appetite is OK Asks to increase lorazepam to 2 mg AM and HS and 46m afternoon  02/16/22 appt noted: Patient received Spravato 84 mg today.  She tolerated it well without any unusual headache, nausea or vomiting or other somatic symptoms.  Dissociation did occur and she  gradually saw resolution over the 2-hour period of observation.  She does not typically find the dissociation very strong. No SE complaints with meds. She has chronic depesssion and OCD but is improved with Spravato, both dx versus before.  She has continued Luvox 400 mg and Wellbutrin 450 mg and is tolerating it.  Chronically easily stressed.  Tolerating all meds.  Doesn't like taking more meds.  Spending a couple hours daily with OCD.  No SI No med changes.  02/21/22 appt noted:   Doesn't like taking more meds.  Spending a couple hours daily with OCD.  No SI No med changes.  02/21/22 appt noted: Patient received Spravato 84 mg today.  She  tolerated it well without any unusual headache, nausea or vomiting or other somatic symptoms.  Dissociation did occur and she gradually saw resolution over the 2-hour period of observation.  She does not typically find the dissociation very strong. No SE complaints with meds. She has chronic depesssion and OCD but is improved with Spravato, both dx versus before.  She has continued Luvox 400 mg and Wellbutrin 450 mg and is tolerating it.  Chronically easily overwhelmed and doesn't know why.  Tolerating all meds. Wants to continue meds.  03/16/22 appt noted: Patient received Spravato 84 mg today.  She tolerated it well without any unusual headache, nausea or vomiting or other somatic symptoms.  Dissociation did occur and she gradually saw resolution over the 2-hour period of observation.  She does not typically find the dissociation very strong. No SE complaints with meds. Overall she still feels the Spravato has been helpful not only for her depression but also for her OCD which was somewhat unexpected.  OCD is still significant but it is less severe than prior to starting Spravato.  She is tolerating Luvox 400 mg and Wellbutrin 450 mg.  We discussed possible med adjustments.  03/23/22 appt noted: Patient received Spravato 84 mg today.  She tolerated it well without any unusual headache, nausea or vomiting or other somatic symptoms.  Dissociation did occur and she gradually saw resolution over the 2-hour period of observation.  She does not typically find the dissociation very strong. No SE complaints with meds. She is still depressed and still has OCD of course but is improved with the Spravato.  She is tolerating the medications well.  We had previously discussed the possibility of switching some of the Wellbutrin to [facility] and she is very interested in that in hopes of further improvement in depression and OCD.  She understands that Auvelity is not used for OCD on the label.  She is tolerating the  medications.  She is still easily overwhelmed.  She is sleeping and eating okay.. Plan: Reduce Wellbutrin XL to 300 mg AM and add Auvelity 1 tablet each AM  03/30/22 appt noted: Patient received Spravato 84 mg today.  She tolerated it well without any unusual headache, nausea or vomiting or other somatic symptoms.  Dissociation did occur and she gradually saw resolution over the 2-hour period of observation.  She does not typically find the dissociation very strong. No SE complaints with meds. She is still depressed and still has OCD of course but is improved with the Spravato.  She is tolerating the medications well.  No difference with Auvelity 1 AM so far and no SE.  Going on vacation on Saturday. 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 Sleep and appetite good.   Previous psych med trials include Prozac, paroxetine, sertraline, fluvoxamine, venlafaxine, Anafranil  with no response,  Wellbutrin, , Viibryd, Trintellix 10 1 month NR Geodon,  risperidone, Rexulti, Abilify,  Seroquel, Latuda 40 mg with irritability.  lamotrigine lithium,  BuSpar, Namenda,  pramipexole with no response, and Topamax, pindolol  ECT-MADRS    [place] Office Visit from 06/29/2021 in [place] Total Score 36      Flowsheet Row Admission (Discharged) from 06/11/2021 in [place] No Risk        Review of Systems:  Review of Systems  Constitutional:  Positive for fatigue.  Cardiovascular:  Negative for chest pain and palpitations.  Musculoskeletal:  Positive for arthralgias, back pain and gait problem. Negative for joint swelling.  Neurological:  Positive for weakness. Negative for tremors.  Psychiatric/Behavioral:  Positive for dysphoric mood. Negative for agitation and suicidal ideas. The patient is nervous/anxious.     Medications: I have reviewed the patient's current medications.  Current Outpatient Medications   Medication Sig Dispense Refill   Abaloparatide (TYMLOS) 3120 MCG/1.56ML SOPN Inject into the skin.     Azelastine-Fluticasone 137-50 MCG/ACT SUSP Place 1-2 sprays into both nostrils daily.     baclofen (LIORESAL) 10 MG tablet Take 20 mg by mouth at bedtime as needed for muscle spasms.     buPROPion (WELLBUTRIN XL) 300 MG 24 hr tablet TAKE 1 TABLET BY MOUTH EVERY DAY 90 tablet 0   Dextromethorphan-buPROPion ER (AUVELITY) 45-105 MG TBCR Take 1 tablet by mouth every morning.     dicyclomine (BENTYL) 10 MG capsule Take 10 mg by mouth daily.     docusate sodium (COLACE) 100 MG capsule Take 1 capsule (100 mg total) by mouth 2 (two) times daily. (Patient taking differently: Take 100 mg by mouth daily.) 10 capsule 0   Esketamine HCl, 84 MG Dose, (SPRAVATO, 84 MG DOSE,) 28 MG/DEVICE SOPK USE 3 SPRAYS IN EACH NOSTRIL ONCE A WEEK 3 each 3   fexofenadine (ALLEGRA) 180 MG tablet Take 180 mg by mouth daily.     fluvoxaMINE (LUVOX) 100 MG tablet Take 3 tablets (300 mg total) by mouth at bedtime. 270 tablet 0   fluvoxaMINE (LUVOX) 100 MG tablet Take 1 tablet (100 mg total) by mouth at bedtime. 90 tablet 0   hydrocortisone (ANUSOL-HC) 2.5 % rectal cream Place rectally 2 (two) times daily. x 7-14 days 30 g 0   ketotifen (ZADITOR) 0.025 % ophthalmic solution Place 3 drops into both eyes at bedtime.     LORazepam (ATIVAN) 1 MG tablet 2 in the AM and HS and 1 tablet in afternoon 150 tablet 3   magnesium gluconate (MAGONATE) 500 MG tablet Take 500 mg by mouth daily.     MIBELAS 24 FE 1-20 MG-MCG(24) CHEW Chew 1 tablet by mouth at bedtime as needed (bowel regularity).     Multiple Vitamins-Minerals (ADULT GUMMY PO) Take 2 tablets by mouth in the morning.     nitrofurantoin (MACRODANTIN) 100 MG capsule Take 100 mg by mouth as needed (For urinary tract infection.).      oxyCODONE-acetaminophen (PERCOCET/ROXICET) 5-325 MG tablet Take 1-2 tablets by mouth every 6 (six) hours as needed for severe pain. 50 tablet 0    polyethylene glycol (MIRALAX / GLYCOLAX) packet Take 17 g by mouth daily as needed for mild constipation. 14 each 0   psyllium (METAMUCIL) 58.6 % powder Take 1 packet by mouth daily as needed (constipation).     temazepam (RESTORIL) 30 MG capsule Take 1 capsule (30 mg total) by mouth  at bedtime as needed for sleep. 30 capsule 5   Vitamin D-Vitamin K (VITAMIN K2-VITAMIN D3 PO) Take 1-2 sprays by mouth daily.     buPROPion (WELLBUTRIN XL) 150 MG 24 hr tablet TAKE 3 TABLETS BY MOUTH DAILY. (Patient not taking: Reported on 03/30/2022) 270 tablet 0   No current facility-administered medications for this visit.    Medication Side Effects: None   Allergies:  Allergies  Allergen Reactions   Hydrocodone Itching   Sulfamethoxazole-Trimethoprim Itching   Dust Mite Extract Other (See Comments)    Sneezing, watery eyes, runny nose   Latex Itching   Other Other (See Comments)    PT IS ALLERGIC TO CAT DANDER AND RAGWEED - Sneezing, watery eyes, runny nose    Pollen Extract Other (See Comments)    Sneezing, watery eyes, runny nose     Past Medical History:  Diagnosis Date   Abnormal Pap smear 2011   hpv/mild dysplasia,cin1   Anxiety    Cerebral palsy (HCC)    right arm/leg   Cystocele    Depression    Headache    Neuromuscular disorder (HCC)    Cerebral Palsy   OCD (obsessive compulsive disorder)    Osteoporosis    Uterine prolaps     Family History  Problem Relation Age of Onset   Cancer Father        skin AND LUNG   Alcohol abuse Sister        CRACK COCAINE    Social History   Socioeconomic History   Marital status: Married    Spouse name: Not on file   Number of children: Not on file   Years of education: Not on file   Highest education level: Not on file  Occupational History   Not on file  Tobacco Use   Smoking status: Never   Smokeless tobacco: Never  Substance and Sexual Activity   Alcohol use: Not Currently    Comment: OCCASIONAL beer   Drug use: No    Sexual activity: Yes    Birth control/protection: Pill    Comment: LOESTRIN 24 FE  Other Topics Concern   Not on file  Social History Narrative   Not on file   Social Determinants of Health   Financial Resource Strain: Not on file  Food Insecurity: Not on file  Transportation Needs: Not on file  Physical Activity: Not on file  Stress: Not on file  Social Connections: Not on file  Intimate Partner Violence: Not on file    Past Medical History, Surgical history, Social history, and Family history were reviewed and updated as appropriate.   Please see review of systems for further details on the patient's review from today.   Objective:   Physical Exam:  LMP  (LMP Unknown)   Physical Exam Neurological:     Mental Status: She is alert and oriented to person, place, and time.     Cranial Nerves: No dysarthria.     Motor: Weakness present.     Gait: Gait abnormal.  Psychiatric:        Attention and Perception: Attention and perception normal.        Mood and Affect: Mood is anxious and depressed. Affect is not labile or tearful.        Speech: Speech normal. Speech is not rapid and pressured or slurred.        Behavior: Behavior normal. Behavior is cooperative.        Thought Content: Thought content normal. Thought  content is not delusional. Thought content does not include homicidal or suicidal ideation. Thought content does not include suicidal plan.        Cognition and Memory: Cognition and memory normal. Cognition is not impaired.        Judgment: Judgment normal.     Comments: Insight intact Pleasant. Ongoing OCD remains fairly severe but less anxious Checking compulsions  2 hours daily but improving noticeably Chronic depression persistent but better with Spravato      Lab Review:     Component Value Date/Time   NA 138 06/11/2021 0606   K 4.0 06/11/2021 0606   CL 107 06/11/2021 0606   CO2 26 06/11/2021 0606   GLUCOSE 90 06/11/2021 0606   BUN 18 06/11/2021  0606   CREATININE 0.81 06/11/2021 0606   CALCIUM 9.4 06/11/2021 0606   PROT 6.5 06/11/2021 0606   ALBUMIN 3.3 (L) 06/11/2021 0606   AST 17 06/11/2021 0606   ALT 14 06/11/2021 0606   ALKPHOS 141 (H) 06/11/2021 0606   BILITOT 0.2 (L) 06/11/2021 0606   GFRNONAA >60 06/11/2021 0606   GFRAA >60 07/09/2016 0438       Component Value Date/Time   WBC 5.8 06/11/2021 0606   RBC 4.12 06/11/2021 0606   HGB 12.5 06/11/2021 0606   HCT 39.7 06/11/2021 0606   PLT 299 06/11/2021 0606   MCV 96.4 06/11/2021 0606   MCH 30.3 06/11/2021 0606   MCHC 31.5 06/11/2021 0606   RDW 13.9 06/11/2021 0606   LYMPHSABS 1.9 06/11/2021 0606   MONOABS 0.5 06/11/2021 0606   EOSABS 0.1 06/11/2021 0606   BASOSABS 0.0 06/11/2021 0606    No results found for: "POCLITH", "LITHIUM"   No results found for: "PHENYTOIN", "PHENOBARB", "VALPROATE", "CBMZ"   .res Assessment: Plan:    Robertha was seen today for follow-up, depression and anxiety.  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  Impaired function but less so with Spravato re: depression..   She is receiving Spravato 84 mg weekly and marked improvement in the depression..  she feels it also helps OCD somewhat.  However still easily overwhelmed with low stress tolerance.  The OCD is improving some with the increase in fluvoxamine and with Spravato.  Spends 2 hours daily and checking compulsions.  She has been on higher doses of fluvoxamine above the usual max of 400 mg daily in the past.  This became difficult to obtain at 1 point and the dose was reduced to 300 mg daily.   Disc SE. She would like to continue Luvox 400 mg daily again to see if her OCD can be better controlled   She is tolerating the meds well  Continue Increased Luvox back  to 400 mg nightly as of the third week of January 2023. Consider reevaluating dose.  Disc dosing.  Sehe feels this is starting to help more with OCD which remains chronically severe. Increased Wellbutrin XL back to 450 mg every morning for depression as of the third week of January 2023 continue Wellbutrin XL to 300 mg AM and Auvelity 1 tablet each AM until after vacation and will increase Auvelity BID and reduce Wellbutrin to 150 AM We discussed potential side effects and drug interaction issues.  Disc Spravato DT TRD incl details and SE. Disc  dosing and duration.  Pt with severe depression MADRS 36 on 06/29/21 Patient was administered Spravato 84 mg intranasally dosage today.  The patient experienced the typical dissociation which gradually resolved over the 2-hour period of observation.  There were no complications.  Specifically the patient did not have nausea or vomiting or headache.  Blood pressures remained within normal ranges at the 40-minute and 2-hour follow-up intervals.  By the time the 2-hour observation period was met the patient was alert and oriented and able to exit without assistance. She tends to have lingering sedative effects but not severe. .  See nursing note for further details. Per protocol will continue Spravato to 84 mg next session.  She has been okay since cutting back to once weekly.  We discussed the short-term risks associated with benzodiazepines including sedation and increased fall risk among others.  Discussed long-term side effect risk including dependence, potential withdrawal symptoms, and the potential eventual dose-related risk of dementia.  But recent studies from 2020 dispute this association between benzodiazepines and dementia risk. Newer studies in 2020 do not support an association with dementia. For TR anxiety lorazepam to 68m AM and HS and 1 mg in afternoon.  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  Supportive therapy  dealing with some of the recent stressors including family stressors and her own health related to cerebral palsy   Follow-up weekly .  Consider twice weekly.   CLynder Parents MD, DFAPA  Please see After Visit Summary for patient specific instructions.  Future Appointments  Date Time Provider [place] 04/14/2022  2:00 PM ZVedia Pereyra [facility]  04/18/2022  1:30 PM ZVedia Pereyra PT [facility]  05/09/2022 11:00 AM MBlanchie Serve PhD CP-CP None  06/13/2022 11:00 AM MBlanchie Serve PhD CP-CP None    No orders of the defined types were placed in this encounter.    -------------------------------

## 2022-03-31 ENCOUNTER — Other Ambulatory Visit: Payer: Self-pay | Admitting: Psychiatry

## 2022-04-01 ENCOUNTER — Encounter (HOSPITAL_BASED_OUTPATIENT_CLINIC_OR_DEPARTMENT_OTHER): Payer: Self-pay | Admitting: Physical Therapy

## 2022-04-01 ENCOUNTER — Ambulatory Visit (HOSPITAL_BASED_OUTPATIENT_CLINIC_OR_DEPARTMENT_OTHER): Payer: 59 | Admitting: Physical Therapy

## 2022-04-01 DIAGNOSIS — R262 Difficulty in walking, not elsewhere classified: Secondary | ICD-10-CM

## 2022-04-01 DIAGNOSIS — R2689 Other abnormalities of gait and mobility: Secondary | ICD-10-CM

## 2022-04-01 DIAGNOSIS — M6281 Muscle weakness (generalized): Secondary | ICD-10-CM | POA: Diagnosis not present

## 2022-04-01 DIAGNOSIS — M5459 Other low back pain: Secondary | ICD-10-CM

## 2022-04-01 NOTE — Therapy (Signed)
OUTPATIENT PHYSICAL THERAPY TREATMENT NOTE   Patient Name: Casey Diaz MRN: [id number] DOB:[birth year], 56 y.o., female Today's Date: 04/01/2022  PCP: Shon Baton, MD REFERRING PROVIDER: Shon Baton, MD   PT End of Session - 04/01/22 1153     Visit Number 20    Number of Visits 22    Date for PT Re-Evaluation 04/22/22    PT Start Time 1150    PT Stop Time 1230    PT Time Calculation (min) 40 min    Activity Tolerance Patient tolerated treatment well    Behavior During Therapy [facility] for tasks assessed/performed                Past Medical History:  Diagnosis Date   Abnormal Pap smear 2011   hpv/mild dysplasia,cin1   Anxiety    Cerebral palsy ([place])    right arm/leg   Cystocele    Depression    Headache    Neuromuscular disorder (HCC)    Cerebral Palsy   OCD (obsessive compulsive disorder)    Osteoporosis    Uterine prolaps    Past Surgical History:  Procedure Laterality Date   COLPOSCOPY  2011   ELBOW SURGERY     left elbow-separation of bones -age 18   ELBOW SURGERY  2015   fell on ice- right elbow fracture   FRACTURE SURGERY     HAMMER TOE SURGERY  1/13   RIGHT SIDE   HIP PINNING,CANNULATED Left 07/08/2016   Procedure: LEFT CLOSED REDUCTION HIP AND PERCUTANEOUS SCREW;  Surgeon: Paralee Cancel, MD;  Location: [facility];  Service: Orthopedics;  Laterality: Left;   ORIF HUMERUS FRACTURE Right 06/11/2021   Procedure: OPEN REDUCTION INTERNAL FIXATION (ORIF) DISTAL HUMERUS FRACTURE;  Surgeon: Altamese Copeland, MD;  Location: [facility];  Service: Orthopedics;  Laterality: Right;   WRIST SURGERY  2005   left wrist   Patient Active Problem List   Diagnosis Date Noted   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[place]) 08/03/2018   Relationship problem with family member 08/03/2018   Femoral neck fracture, left, closed, initial encounter 07/08/2016   Severe recurrent major depression without psychotic features ([place]) 06/03/2015    Class:  Chronic   Obsessive-compulsive disorder 06/03/2015    Class: Chronic    REFERRING DIAG: M54.51 (ICD-10-CM) - Vertebrogenic low back pain   THERAPY DIAG:  Muscle weakness (generalized)  Difficulty in walking, not elsewhere classified  Other abnormalities of gait and mobility  Arthralgia of lumbar spine  PERTINENT HISTORY: CP OPEN REDUCTION INTERNAL FIXATION (ORIF) DISTAL HUMERUS FRACTURE IM nail Left hip 2017 Osteoporosis  PRECAUTIONS: fall  SUBJECTIVE: "Yesterday I was quite sore and had trouble getting RLE into car" .  Pt requests a lighter session than previous one. "I'm just stiff every where" PAIN:  Are you having pain? Yes NPRS scale: 4/10 current Pain location:  Rt ankle PAIN TYPE: aching, sore Pain description: constant  Aggravating factors: walking  Relieving factors: rest    PRECAUTIONS: Fall   WEIGHT BEARING RESTRICTIONS No   FALLS:  Has patient fallen in last 6 months? No, Number of falls: 1 in Aug 2022   LIVING ENVIRONMENT: Lives with: lives with their family Lives in: House/apartment Stairs: Yes; Internal: 15 steps; none Has following equipment at home: None     PLOF: Independent with household mobility without device   PATIENT GOALS to get stronger, more coordinated and better balanced.     OBJECTIVE: * Findings taken at [facility] unless otherwise  noted.      PATIENT SURVEYS:  FOTO 44 with goal of 50 01/20/22: 53     COGNITION:          Overall cognitive status: Within functional limits for tasks assessed                        SENSATION:          intact   MUSCLE LENGTH: Hamstrings: Right 70 deg; Left 90 deg Thomas test: Right neg deg; Left neg deg   POSTURE:   left shoulder elevation, weightbearing shifted left   PALPATION: Tenderness bilat sub occipital and upper traps   LUMBARAROM/PROM   A/PROM A/PROM  12/15/2021  Flexion Forward reach 4 inches to floor  Extension    Right lateral flexion    Left lateral flexion    Right  rotation    Left rotation     (Blank rows = not tested)   LE AROM/PROM:   A/PROM Right 12/15/2021 Left 12/15/2021 Right 01/20/22 Left 01/20/22 Right/Left 03/18/22  Hip flexion 90 105   105/114  Hip extension         Hip abduction 25 30 28  35   Hip adduction         Hip internal rotation         Hip external rotation         Knee flexion 105 120     Knee extension         Ankle dorsiflexion 5       Ankle plantarflexion 10       Ankle inversion         Ankle eversion          (Blank rows = not tested)   LE MMT:   MMT Right 12/15/2021 Left 12/15/2021  Hip flexion 5 4  Hip extension 4 4  Hip abduction 5 5  Hip adduction 5 5  Hip internal rotation      Hip external rotation      Knee flexion 5 5  Knee extension      Ankle dorsiflexion      Ankle plantarflexion      Ankle inversion      Ankle eversion       (Blank rows = not tested)   LUMBAR SPECIAL TESTS:  Straight leg raise test: Negative, Slump test: Negative, and Thomas test: Negative   FUNCTIONAL TESTS:  5 times sit to stand: 20 Timed up and go (TUG): 13 Berg Balance Scale: 36/56  03/18/22: 41/56  02/01/22:  5x STS  = 12.09 sec (no UE support)   GAIT: Distance walked: 559ft Assistive device utilized: None Level of assistance: Complete Independence Comments: right trendelenburg, left leaning, no heel strike or knee extension right, shortened step length      TODAY'S TREATMENT  Pt seen for aquatic therapy today.  Treatment took place in water 3.25-4.8 ft in depth at the 120f pool. Temp of water was 91.  Pt entered/exited the pool via stairs (step to pattern) independently with single rail.  Warm up: forward / backward walking, side stepping with arm abdct/ add Forward walking kicks holding yellow noodle Holding yellow noodle : CW hip circles x 10 each LE, cues to include hip ext Stork stance holding yellow noodle, hip IR/ER with foot near standing LE Orange weighted ball throws against wall, with  focus on pushing through RUE UE pull ups with hands on rail in 16ft 8" area,  feet on bottom stair cutout x 5 (very challenging) Straddling yellow noodle:  cycling, varied speeds Forward step up/retro step down to 2nd step with UE on rails x 5 each LE R lateral step ups with UE on rail x 12 Walking between exercises for recovery  Pt requires buoyancy for support and to offload joints with strengthening exercises. Viscosity of the water is needed for resistance of strengthening; water current perturbations provides challenge to standing balance unsupported, requiring increased core activation.  PATIENT EDUCATION:  Education details: Condition management;  progression of exercise Person educated: Patient Education method: Explanation Education comprehension: verbalized understanding     HOME EXERCISE PROGRAM: Access Code: [facility] URL: [email or web address] Date: 12/15/2021 Prepared by: Denton Meek   Exercises Supine Lower Trunk Rotation - 1 x daily - 7 x weekly - 3 sets - 10 reps Supine Single Knee to Chest Stretch - 1 x daily - 7 x weekly - 3 sets - 10 reps   ASSESSMENT:   CLINICAL IMPRESSION: Pt reported decreased pain / stiffness in Rt ankle after ~20 min of exercises. All exercises were tolerated well.   Unable to complete walking forward hurdles due to decreased balance/ coordination.  Completed forward walking kicks with some challenge.  Forward and lateral step ups with RLE remains challenging. Pt is making good gains towards remaining goals.    OBJECTIVE IMPAIRMENTS Abnormal gait, decreased activity tolerance, decreased balance, decreased coordination, decreased mobility, difficulty walking, decreased strength, and pain.    ACTIVITY LIMITATIONS cleaning, community activity, yard work, and shopping.    PERSONAL FACTORS Time since onset of injury/illness/exacerbation and 3+ comorbidities:    are also affecting patient's functional outcome.      REHAB  POTENTIAL: Good   CLINICAL DECISION MAKING: Evolving/moderate complexity   EVALUATION COMPLEXITY: Moderate     GOALS: Goals reviewed with patient? Yes   SHORT TERM GOALS:   STG Name Target Date Goal status  1 Pt will tolerate entire sessions of aquatic therapy without increase in cervical or LBP Baseline:  01/05/2022  Achieved 01/20/22  2 Pt will improve bilat hip abduction by 5d Baseline: R 25d; L30d 01/05/2022  Partially met 01/20/22  3 Pt will improve bilat hip flex to 110d Baseline:R 90d; L 105d 7/723  Ongoing 01/20/22    LONG TERM GOALS:    LTG Name Target Date Goal status  1 Pt will improve on FOTO  to 50 to demonstrate improved functional status Baseline:44 01/26/2022  Achieved 01/20/22  2 Pt will improve on Berg Balance Test to >or= 46/56 to demonstrate improvement in balance and decreased fall risk Baseline:36/56 7/723  Ongoing   3 Pt will improve on 5 X STS test to <or= 15 to demonstrate improvement in LE strength Baseline:12.09 seconds 02/01/22 03/03/2022 Achieved 02/01/22    4 Pt will decrease overall pain in cervical spine and LB to 4/10 or< for improved toleration to activity Baseline: 04/22/22  Ongoing   5   Pt to be indep with final aquatic HEP 04/22/22  NEW          PLAN: PT FREQUENCY: 1/week   PT DURATION: 5 weeks   PLANNED INTERVENTIONS: Therapeutic exercises, Therapeutic activity, Neuromuscular re-education, Balance training, Gait training, Patient/Family education, Joint manipulation, Joint mobilization, Stair training, Aquatic Therapy, Taping, and Manual therapy   PLAN FOR NEXT SESSION: Progress HEP, prepare for transition to community program.  2 visits left in this POC.   Kerin Perna, PTA 04/01/22 1:56 PM

## 2022-04-01 NOTE — Telephone Encounter (Signed)
Will submit PA for 4 daily. Hold refill until verified

## 2022-04-05 NOTE — Telephone Encounter (Signed)
Did PA for 4 daily work?

## 2022-04-07 ENCOUNTER — Ambulatory Visit (INDEPENDENT_AMBULATORY_CARE_PROVIDER_SITE_OTHER): Payer: 59 | Admitting: Behavioral Health

## 2022-04-07 ENCOUNTER — Ambulatory Visit (HOSPITAL_BASED_OUTPATIENT_CLINIC_OR_DEPARTMENT_OTHER): Payer: 59 | Admitting: Physical Therapy

## 2022-04-07 ENCOUNTER — Ambulatory Visit: Payer: 59

## 2022-04-07 VITALS — BP 120/78 | HR 79

## 2022-04-07 DIAGNOSIS — F339 Major depressive disorder, recurrent, unspecified: Secondary | ICD-10-CM | POA: Diagnosis not present

## 2022-04-08 NOTE — Telephone Encounter (Signed)
There is a PA from April that says it is covered by her plan.

## 2022-04-11 ENCOUNTER — Telehealth: Payer: Self-pay | Admitting: Psychiatry

## 2022-04-11 NOTE — Telephone Encounter (Signed)
Please see message. °

## 2022-04-11 NOTE — Telephone Encounter (Signed)
She needs to decrease the Wellbutrin to 150 mg with Auvelity other wise she will get too anxious.  It is worth continuing the trial of Auvelity because it may make a big difference if given a chance.  We can always switch it back to Wellbutrin later if we need to

## 2022-04-12 ENCOUNTER — Encounter (HOSPITAL_BASED_OUTPATIENT_CLINIC_OR_DEPARTMENT_OTHER): Payer: Self-pay | Admitting: Physical Therapy

## 2022-04-12 ENCOUNTER — Ambulatory Visit (HOSPITAL_BASED_OUTPATIENT_CLINIC_OR_DEPARTMENT_OTHER): Payer: 59 | Admitting: Physical Therapy

## 2022-04-12 DIAGNOSIS — M6281 Muscle weakness (generalized): Secondary | ICD-10-CM

## 2022-04-12 DIAGNOSIS — M5459 Other low back pain: Secondary | ICD-10-CM

## 2022-04-12 DIAGNOSIS — R262 Difficulty in walking, not elsewhere classified: Secondary | ICD-10-CM

## 2022-04-12 DIAGNOSIS — R2689 Other abnormalities of gait and mobility: Secondary | ICD-10-CM

## 2022-04-12 NOTE — Telephone Encounter (Signed)
Spoke with pt earlier and she is aware to decrease Wellbutrin XL 150 mg daily and increase Auvelity 45 mg-105 mg bid. She verbalized understanding. Didn't sound as anxious today as yesterday. We will see her Thursday for her Spravato treatment.

## 2022-04-13 ENCOUNTER — Encounter: Payer: 59 | Admitting: Behavioral Health

## 2022-04-13 NOTE — Telephone Encounter (Signed)
Thank you and great job!

## 2022-04-14 ENCOUNTER — Ambulatory Visit: Payer: 59

## 2022-04-14 ENCOUNTER — Ambulatory Visit (HOSPITAL_BASED_OUTPATIENT_CLINIC_OR_DEPARTMENT_OTHER): Payer: Self-pay | Admitting: Physical Therapy

## 2022-04-14 ENCOUNTER — Encounter: Payer: Self-pay | Admitting: Psychiatry

## 2022-04-14 ENCOUNTER — Ambulatory Visit (INDEPENDENT_AMBULATORY_CARE_PROVIDER_SITE_OTHER): Payer: 59 | Admitting: Psychiatry

## 2022-04-14 VITALS — BP 105/67 | HR 77

## 2022-04-14 DIAGNOSIS — F339 Major depressive disorder, recurrent, unspecified: Secondary | ICD-10-CM

## 2022-04-14 DIAGNOSIS — F4010 Social phobia, unspecified: Secondary | ICD-10-CM

## 2022-04-14 DIAGNOSIS — G809 Cerebral palsy, unspecified: Secondary | ICD-10-CM

## 2022-04-14 DIAGNOSIS — F422 Mixed obsessional thoughts and acts: Secondary | ICD-10-CM

## 2022-04-14 DIAGNOSIS — F5105 Insomnia due to other mental disorder: Secondary | ICD-10-CM

## 2022-04-14 NOTE — Progress Notes (Signed)
Casey Diaz [id number] 09-Jan-1966 56 y.o.    Subjective:   Patient ID:  Casey Diaz is a 56 y.o. (DOB Dec 07, 1965) female.  Chief Complaint:  Chief Complaint  Patient presents with   Follow-up   Depression   Anxiety   Fatigue     HPI Casey Diaz presents to the office today for follow-up of OCD and severe anxiety.     December 2019 visit the following was noted: No meds were changed. Lives in [place] and back for followup.  Sx are about the same.  Has to take meds with different sizes. Pt reports that mood is Anxious and Depressed and describes anxiety as Severe. Anxiety symptoms include: Excessive Worry, Obsessive Compulsive Symptoms:   Checking,,. Pt reports has interrupted sleep and nocturia. Pt reports that appetite is good. Pt reports that energy is no change and down slightly. Concentration is down slightly. Suicidal thoughts:  denied by patient. Loves the environment of [place] but misses some things there.  She's not able to work there.  H works there and likes it.  Struggled with not working, feels isolated and not up to task of meeting people.  Does attend a church and met a friend who's been helpful.  Leaving for [place] on 10/16/18.   04/09/2020 appointment the following is noted:  Staying another year in [place] bc Covid and other things. Last few months a lot of crying spells.  Is in menopause. Wonders about med changes though is nervous about it.  Crying spells associated with depressing thoughts more than stress or OCD.   Covid really hard on everyone and couldn't see family for 18 mos.  Family still very dysfunctional. No close friends in part due to OCD and depression. Son high Autism spectrum with ADHD and anxiety and she's with him all the time. Greater health problems with CP so more pains.   05/15/20 appt with the following noted: [name] for menopause and helps some. Still depressed.  Chronically. In [place] for 2 more weeks then to [place] for  another year. A lot of stressors lately triggering more checking and anxiety.   OCD is her CC now and seems.  Got worse DT stress.   Stressed with Asberger's son and her health.  H works a lot.  Her FOO still stress. Plan: Trintellix 10 mg 1 tablet in the morning with food and reduce fluvoxamine to 5 tablets nightly for 1 week  then reduce it to 4 tablets nightly.   07/02/20 appt with the following noted: Decided not to get Trintellix bc difficulty getting it. It is available.  There.  Wants to start it now.   Both depression and OCD are severe.  Not suicidal in intent or plan. Did not take samples with her to [place] but will be back in December. covid is worse there and travel is difficult.  Wants to reduce Wellbutrin DT dry mouth. Plan: She's afraid to reduce Luvox at this time DT fear of worsening OCD.  But will consider. Trintellix 10 mg 1 tablet in the morning with food and reduce fluvoxamine to 5 tablets nightly for 1 week  then reduce it to 4 tablets nightly. Also reduce Wellbutrin XL to 300 mg daily.    9-13 2022 appointment with the following noted: Back in [place] since July 14.  Broke arm a month ago and surgery.  It's all been rough adjustment.   B has cancer on his face and M fell taking him to the doctor.  Misses the water and weather of [place].   Cry a lot more since menopause. Still depression and anxiety and OCD.  Asks about ketamine. On Wellbutrin 300, Luvox 300.  No Trintellix. Added Ativan 2 mg AM and HS and it helps.  More likely to get upset at night. Plan: Increase Luvox back to 400 mg daily.  She thinks she's worse on less. Continue Wellbutrin XL to 300 mg daily. 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  She tolerated it well without unusual nausea or vomiting headache or other somatic symptoms.  She did have the expected dissociation which gradually resolved over  the course of the 2-hour period of observation.  She was a little concerned about her balance given her cerebral palsy but has not noted unusual or unexpected problems.  She is motivated to can continue Spravato in hopes of reducing her depressive symptoms. 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  She also has treatment resistant OCD which is partially managed with medications but is still quite disabling.  She is tolerating the medications well.  She is sleeping adequately.  Her appetite is adequate.  She is not having suicidal thoughts.  She continues to wish for a better treatment for OCD that would give her some relief.  09/30/2021 appointment with the following noted: She received her first dose of Spravato 84 mg intranasally today.  She tolerated it well without unusual nausea, vomiting, or other somatic symptoms.  Dissociation as expected did occur and gradually resolved over the 2-hour period of observation.  She did have a mild headache today with the treatment and received ibuprofen 600 mg at her request.  We will follow this to see if it is a pattern Patient is still depressed.  She said she was late with her medicine today and today was a particularly depressing day.  However she notes that the Spravato has lifted her mood considerably even today.  She is hopeful that it will continue to be helpful.  No suicidal thoughts.  She has ongoing chronic anxiety and OCD at baseline.  10/04/21 appt noted: Patient received Spravato 84 mg for the second time today.  She tolerated it well without any unusual headache, nausea or vomiting or other somatic symptoms.  Dissociation did occur and she gradually [place] resolution over the 2-hour period of observation. She did not have any unusual problems after she left the office last Spravato administration.  She did not have any specific problems with balance or walking.  She is at increased risk of that difficulty because of  cerebral palsy.  So far she has not noticed much mood effect from the medication beyond the first day of receiving it.  However she would like to continue Spravato in hopes of getting the antidepressant effect that is desired. Stress dealing with mother's behavior at party pt hosted.  Guilt over it.  10/07/2021 appointment noted: Patient received Spravato 84 mg for the second time today.  She tolerated it well without any unusual headache, nausea or vomiting or other somatic symptoms.  Dissociation did occur and she gradually [place] resolution over the 2-hour period of observation. She still is not sure about the antidepressant effect of Spravato.  Events over the holidays and demands, make it difficult to assess.  She still notes that the OCD tends to worsen the depression and vice versa.  She tolerates the  Spravato well and wants to continue the trial.  10/15/2021 appointment with the following noted: Patient received Spravato 84 mg for the second time today.  She tolerated it well without any unusual headache, nausea or vomiting or other somatic symptoms.  Dissociation did occur and she gradually [place] resolution over the 2-hour period of observation. Patient says it was somewhat difficult to evaluate the effect of the Spravato.  It was scheduled to be twice weekly for 4 weeks consecutively but the holidays have interfered with that administration.  She asked what specifically should be she should be looking for in order to assess improvement.  That was discussed.  The OCD is unchanged and the depression so far is not significantly different.  She still tolerates meds.  There have been no recent med changes  10/19/2021 appt noted: Patient received Spravato 84 mg for the second today.  She tolerated it well without any unusual headache, nausea or vomiting or other somatic symptoms.  Dissociation did occur and she gradually saw resolution over the 2-hour period of observation.   10/21/2021 appointment  noted: Patient received Spravato 84 mg today.  She tolerated it well without any unusual headache, nausea or vomiting or other somatic symptoms.  Dissociation did occur and she gradually saw resolution over the 2-hour period of observation.  She feels better than last week.  She is not as depressed and down.  She is still dealing with grief around the death of her cousin that was unexpected.  It is still difficult to tell how much the Spravato was doing but she is hopeful.  Anxiety is still present with the OCD.  She is not having suicidal thoughts.  She is not hopeless.  She wants to continue treatment.  10/25/2021 appointment with the following noted: Patient received Spravato 84 mg today.  She tolerated it well without any unusual headache, nausea or vomiting or other somatic symptoms.  Dissociation did occur and she gradually saw resolution over the 2-hour period of observation.  She does not typically find the dissociation very strong. She is beginning to think the Spravato is helping somewhat with the depression.  It has been difficult to tell with the holidays intervening as well as the death of her cousin.  She has not been able to get Spravato twice weekly for 4 weeks straight as typically planned.  However she is hopeful.  The OCD remains significant.  She still has a tendency to think very negatively.  She is not suicidal.  10/28/2021 appointment with the following noted: Patient received Spravato 84 mg today.  She tolerated it well without any unusual headache, nausea or vomiting or other somatic symptoms.  Dissociation did occur and she gradually saw resolution over the 2-hour period of observation.  She does not typically find the dissociation very strong. She is feeling more hopeful about the administration of Spravato.  She is having less depression she believes.  Still not dramatically different.  She still has a tendency to have a lot of anxiety and rumination and OCD.  She is not suicidal.   She is eager to continue the Spravato.  11/01/2021 appointment with the following noted: Patient received Spravato 84 mg today.  She tolerated it well without any unusual headache, nausea or vomiting or other somatic symptoms.  Dissociation did occur and she gradually saw resolution over the 2-hour period of observation.  She does not typically find the dissociation very strong. She is continuing to see a little bit of improvement in depression  with Spravato.  The anxiety remains but may be not as severe.  The OCD remains markedly severe chronically.  She is not suicidal.  She is encouraged by the degree of improvement with Spravato and inability to enjoy things more and not be quite as ruminative.  11/04/2021 appt noted: Patient received Spravato 84 mg today.  She tolerated it well without any unusual headache, nausea or vomiting or other somatic symptoms.  Dissociation did occur and she gradually saw resolution over the 2-hour period of observation.  She does not typically find the dissociation very strong. No SE complaints with meds. She continues to feel hopeful about the Spravato.  She has less depression.  Because of a number of factors she is uncertain of the full benefit but thinks she is somewhat less depressed.  Her anxiety and OCD remain significant but a little better.  She is tolerating the medications and does not desire medicine change.  She is not currently complaining of insomnia.   11/08/2021 appointment the following noted: Patient received Spravato 84 mg today.  She tolerated it well without any unusual headache, nausea or vomiting or other somatic symptoms.  Dissociation did occur and she gradually saw resolution over the 2-hour period of observation.  She does not typically find the dissociation very strong. No SE complaints with meds. She feels the Spravato is helping somewhat.  She would like to see a greater effect.  However she is able to enjoy things.  She is productive at home.   She would like to see a lifting of a degree of sadness that remains.  The anxiety and OCD remained largely unchanged.  She wondered about the dosing of Wellbutrin 300 mg a day and Luvox 300 mg a day and possible increases.  She has been at higher doses in the past.  She plans to start water therapy for her weakness and for her shoulder.  11/11/2021 appointment with the following noted: Patient received Spravato 84 mg today.  She tolerated it well without any unusual headache, nausea or vomiting or other somatic symptoms.  Dissociation did occur and she gradually saw resolution over the 2-hour period of observation.  She does not typically find the dissociation very strong. No SE complaints with meds. She feels the Spravato is clearly helping the depression.  She would like to see a more significant effect.  She is still having trouble thinking positive. Her energy is fair.  Concentration is good except for the problem with chronic obsessions. She has been taking Wellbutrin 300 mg in Luvox 300 mg for quite some time but has taken higher doses in the past.  We discussed that.  She would like to try higher doses in order to get a better effect if possible. We just increased the doses a couple of days ago.  No effect yet.  11/15/2021 appointment with the following noted: Patient received Spravato 84 mg today.  She tolerated it well without any unusual headache, nausea or vomiting or other somatic symptoms.  Dissociation did occur and she gradually saw resolution over the 2-hour period of observation.  She does not typically find the dissociation very strong. No SE complaints with meds. The patient is now convinced that the Spravato is helping the depression.  She would like to continue twice weekly Spravato this week if possible.  She has tolerated the increase in Wellbutrin to 450 mg daily and the increase and fluvoxamine to 400 mg daily without complications thus far.  The OCD and anxiety feed  the  depression to some extent. She spends approximately 2 hours daily with checking compulsions due to obsessions about causing harm to others.  For example fearing that when she has hit a pot hole that she may have hit a person and going back to check.  Checking corners and rooms out of fear that she may have harmed someone.  Other various checking compulsions.  She is hoping the increase in fluvoxamine to 400 mg will reduce that over the weeks to come.  She is not seeing a significant difference with the addition of the Spravato though she understands that was not expected.  She is more productive at home and more motivated and able to enjoy things more fully as a result of the Spravato treatment.  She is tolerating the medication  11/18/2021 appointment with the following noted: Patient received Spravato 84 mg today.  She tolerated it well without any unusual headache, nausea or vomiting or other somatic symptoms.  Dissociation did occur and she gradually saw resolution over the 2-hour period of observation.  She does not typically find the dissociation very strong. No SE complaints with meds. She clearly believes the Spravato has been helpful for the depression.  She wonders whether to continue to treatments weekly or to cut back to 1 weekly.  She would like to continue twice weekly in hopes of getting additional improvement in the depression because it is not resolved but it is difficult to get here twice a week in terms of arranging rides. She is recently increased Wellbutrin XL to 450 mg daily and fluvoxamine to 400 mg daily but they have not had time to have an official effect.  She is tolerating that well.  She is tolerating meds overwork overall well. The OCD remains the same as noted on 11/15/2021  11/25/21 appt noted: Patient received Spravato 84 mg today.  She tolerated it well without any unusual headache, nausea or vomiting or other somatic symptoms.  Dissociation did occur and she gradually saw  resolution over the 2-hour period of observation.  She does not typically find the dissociation very strong. No SE complaints with meds. She thinks the increase in Wellbutrin and Luvox have been potentially helpful for depression and OCD respectively.  It has been too early to see the full effect.  She is sleeping and eating well.  She is functioning at home.  She still spends a lot of time that is about 2 hours a day dealing with compulsive behaviors.  12/02/21 appt noted: Patient received Spravato 84 mg today.  She tolerated it well without any unusual headache, nausea or vomiting or other somatic symptoms.  Dissociation did occur and she gradually saw resolution over the 2-hour period of observation.  She does not typically find the dissociation very strong. No SE complaints with meds. Several losses and stressors recently that affect her sense of mood. However still sees significant benefit from the Spravato for her depression.  Wants to continue it. Suspect early  some benefit from the increased Wellbutrin for depression and Luvox for OCD. Tolerating meds. No complaints about the meds. Sleeping and eating well.  No new health concerns.  12/09/21 appt noted: Patient received Spravato 84 mg today.  She tolerated it well without any unusual headache, nausea or vomiting or other somatic symptoms.  Dissociation did occur and she gradually saw resolution over the 2-hour period of observation.  She does not typically find the dissociation very strong. No SE complaints with meds. Seeing noticeable improvement from  increase fluvoxamine to 400 mg daily.  Tolerating meds without concerns over them. Depression is stable with residual sx of easy guilt and easily stressed.  OCD contributes to depression but depression is not severe with less crying spells.  Productive at home with chores.  Enjoyed recent birthday.  Sleeping good. No new concerns.  12/23/2021 appointment noted: Patient received Spravato 84 mg  today.  She tolerated it well without any unusual headache, nausea or vomiting or other somatic symptoms.  Dissociation did occur and she gradually saw resolution over the 2-hour period of observation.  She does not typically find the dissociation very strong. No SE complaints with meds. Seeing noticeable improvement from increase fluvoxamine to 400 mg daily.  Tolerating meds without concerns over them. Her depression is somewhat improved with the Spravato.  She also feels generally a little lighter.  She is more motivated.  She is less overwhelmed by guilt.  The OCD is gradually improving but is still quite time-consuming as noted before.  She is sleeping well.  No side effects  12/30/2021 appointment with the following noted: Patient received Spravato 84 mg today.  She tolerated it well without any unusual headache, nausea or vomiting or other somatic symptoms.  Dissociation did occur and she gradually saw resolution over the 2-hour period of observation.  She does not typically find the dissociation very strong. No SE complaints with meds. Seeing noticeable improvement from increase fluvoxamine to 400 mg daily.  Tolerating meds without concerns over them. She is confident of her the improvement seen with Spravato.  She is less hopeless.  Guilt is marked remarkably improved.  She is not having any thoughts of death or dying.  She is more motivated for activities such as exercise which she is recently started.  She is sleeping well. The OCD remains severe but it is improving somewhat with the increase in fluvoxamine.  It is still consuming a couple hours per day.  01/10/22 apravato 84 admin  01/24/22 appt noted: Patient received Spravato 84 mg today.  She tolerated it well without any unusual headache, nausea or vomiting or other somatic symptoms.  Dissociation did occur and she gradually saw resolution over the 2-hour period of observation.  She does not typically find the dissociation very strong. No  SE complaints with meds. Very tearful today.  Feels like she has been suppressing emotion in the Spravato caused it to be released.  Discussed some stressors.  Overall still feels the medicine is helpful.  She has missed some of the scheduled Spravato treatments that were intended to be weekly due to circumstances beyond her control.  She is still struggling with OCD as previously noted but does believe the medications are helpful. Plan no med changes  01/31/2022 received Spravato 84 mg today  02/09/2022 appointment with the following noted: Patient received Spravato 84 mg today.  She tolerated it well without any unusual headache, nausea or vomiting or other somatic symptoms.  Dissociation did occur and she gradually saw resolution over the 2-hour period of observation.  She does not typically find the dissociation very strong. No SE complaints with meds. Spravato clearly helps depression and OCD but easily gets overwhelmed and tearful with fairly routine stressors.  Tolerating meds. Sleep and appetite is OK Asks to increase lorazepam to 2 mg AM and HS and 67m afternoon  02/16/22 appt noted: Patient received Spravato 84 mg today.  She tolerated it well without any unusual headache, nausea or vomiting or other somatic symptoms.  Dissociation did  occur and she gradually saw resolution over the 2-hour period of observation.  She does not typically find the dissociation very strong. No SE complaints with meds. She has chronic depesssion and OCD but is improved with Spravato, both dx versus before.  She has continued Luvox 400 mg and Wellbutrin 450 mg and is tolerating it.  Chronically easily stressed.  Tolerating all meds.  Doesn't like taking more meds.  Spending a couple hours daily with OCD.  No SI No med changes.  02/21/22 appt noted:   Doesn't like taking more meds.  Spending a couple hours daily with OCD.  No SI No med changes.  02/21/22 appt noted: Patient received Spravato 84 mg today.  She  tolerated it well without any unusual headache, nausea or vomiting or other somatic symptoms.  Dissociation did occur and she gradually saw resolution over the 2-hour period of observation.  She does not typically find the dissociation very strong. No SE complaints with meds. She has chronic depesssion and OCD but is improved with Spravato, both dx versus before.  She has continued Luvox 400 mg and Wellbutrin 450 mg and is tolerating it.  Chronically easily overwhelmed and doesn't know why.  Tolerating all meds. Wants to continue meds.  03/16/22 appt noted: Patient received Spravato 84 mg today.  She tolerated it well without any unusual headache, nausea or vomiting or other somatic symptoms.  Dissociation did occur and she gradually saw resolution over the 2-hour period of observation.  She does not typically find the dissociation very strong. No SE complaints with meds. Overall she still feels the Spravato has been helpful not only for her depression but also for her OCD which was somewhat unexpected.  OCD is still significant but it is less severe than prior to starting Spravato.  She is tolerating Luvox 400 mg and Wellbutrin 450 mg.  We discussed possible med adjustments.  03/23/22 appt noted: Patient received Spravato 84 mg today.  She tolerated it well without any unusual headache, nausea or vomiting or other somatic symptoms.  Dissociation did occur and she gradually saw resolution over the 2-hour period of observation.  She does not typically find the dissociation very strong. No SE complaints with meds. She is still depressed and still has OCD of course but is improved with the Spravato.  She is tolerating the medications well.  We had previously discussed the possibility of switching some of the Wellbutrin to [name] and she is very interested in that in hopes of further improvement in depression and OCD.  She understands that Auvelity is not used for OCD on the label.  She is tolerating the  medications.  She is still easily overwhelmed.  She is sleeping and eating okay.. Plan: Reduce Wellbutrin XL to 300 mg AM and add Auvelity 1 tablet each AM  03/30/22 appt noted: Patient received Spravato 84 mg today.  She tolerated it well without any unusual headache, nausea or vomiting or other somatic symptoms.  Dissociation did occur and she gradually saw resolution over the 2-hour period of observation.  She does not typically find the dissociation very strong. No SE complaints with meds. She is still depressed and still has OCD of course but is improved with the Spravato.  She is tolerating the medications well.  No difference with Auvelity 1 AM so far and no SE.  Going on vacation on Saturday. 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 Sleep and appetite good. Plan: Increase Auvelity to 1 twice daily and reduce Wellbutrin  to XL 150 every morning  04/14/2022 appointment with the following noted: Patient received Spravato 84 mg today.  She tolerated it well without any unusual headache, nausea or vomiting or other somatic symptoms.  Dissociation did occur and she gradually saw resolution over the 2-hour period of observation.  She does not typically find the dissociation very strong. No SE complaints with meds. She is still depressed and still has OCD of course but is improved with the Spravato.  She is tolerating the medications well.  Just increased Auvelity to BID yesterday and reduced Wellbutrin to 150 AM. No SE so far.  No change in mood or anxiety so far.  Chronic OCD as noted and residual depression and chronic fatigue.    Previous psych med trials include Prozac, paroxetine, sertraline, fluvoxamine, venlafaxine, Anafranil with no response,  Wellbutrin, , Viibryd, Trintellix 10 1 month NR Geodon,  risperidone, Rexulti, Abilify,  Seroquel, Latuda 40 mg with irritability.  lamotrigine lithium,  BuSpar, Namenda,  pramipexole with no response, and Topamax, pindolol  ECT-MADRS     [place] Office Visit from 06/29/2021 in [facility] Total Score 36      Flowsheet Row Admission (Discharged) from 06/11/2021 in [facility] No Risk        Review of Systems:  Review of Systems  Constitutional:  Positive for fatigue.  Cardiovascular:  Negative for palpitations.  Musculoskeletal:  Positive for arthralgias, back pain and gait problem. Negative for joint swelling.  Neurological:  Positive for weakness. Negative for tremors.  Psychiatric/Behavioral:  Positive for dysphoric mood. Negative for agitation and suicidal ideas. The patient is nervous/anxious.     Medications: I have reviewed the patient's current medications.  Current Outpatient Medications  Medication Sig Dispense Refill   Abaloparatide (TYMLOS) 3120 MCG/1.56ML SOPN Inject into the skin.     Azelastine-Fluticasone 137-50 MCG/ACT SUSP Place 1-2 sprays into both nostrils daily.     baclofen (LIORESAL) 10 MG tablet Take 20 mg by mouth at bedtime as needed for muscle spasms.     buPROPion (WELLBUTRIN XL) 150 MG 24 hr tablet TAKE 3 TABLETS BY MOUTH DAILY. (Patient taking differently: Take 150 mg by mouth daily.) 270 tablet 0   buPROPion (WELLBUTRIN XL) 300 MG 24 hr tablet TAKE 1 TABLET BY MOUTH EVERY DAY 90 tablet 0   Dextromethorphan-buPROPion ER (AUVELITY) 45-105 MG TBCR Take 1 tablet by mouth every morning.     dicyclomine (BENTYL) 10 MG capsule Take 10 mg by mouth daily.     docusate sodium (COLACE) 100 MG capsule Take 1 capsule (100 mg total) by mouth 2 (two) times daily. (Patient taking differently: Take 100 mg by mouth daily.) 10 capsule 0   Esketamine HCl, 84 MG Dose, (SPRAVATO, 84 MG DOSE,) 28 MG/DEVICE SOPK USE 3 SPRAYS IN EACH NOSTRIL ONCE A WEEK 3 each 3   fexofenadine (ALLEGRA) 180 MG tablet Take 180 mg by mouth daily.     fluvoxaMINE (LUVOX) 100 MG tablet Take 3 tablets (300 mg total) by mouth at bedtime. 270 tablet 0    fluvoxaMINE (LUVOX) 100 MG tablet Take 1 tablet (100 mg total) by mouth at bedtime. 90 tablet 0   fluvoxaMINE (LUVOX) 100 MG tablet Take one tablet (100 mg) by mouth every morning and 3 tablets (300 mg) at bedtime. 120 tablet 5   hydrocortisone (ANUSOL-HC) 2.5 % rectal cream Place rectally 2 (two) times daily. x 7-14 days 30 g 0   ketotifen (ZADITOR)  0.025 % ophthalmic solution Place 3 drops into both eyes at bedtime.     LORazepam (ATIVAN) 1 MG tablet 2 in the AM and HS and 1 tablet in afternoon 150 tablet 3   magnesium gluconate (MAGONATE) 500 MG tablet Take 500 mg by mouth daily.     MIBELAS 24 FE 1-20 MG-MCG(24) CHEW Chew 1 tablet by mouth at bedtime as needed (bowel regularity).     Multiple Vitamins-Minerals (ADULT GUMMY PO) Take 2 tablets by mouth in the morning.     nitrofurantoin (MACRODANTIN) 100 MG capsule Take 100 mg by mouth as needed (For urinary tract infection.).      oxyCODONE-acetaminophen (PERCOCET/ROXICET) 5-325 MG tablet Take 1-2 tablets by mouth every 6 (six) hours as needed for severe pain. 50 tablet 0   polyethylene glycol (MIRALAX / GLYCOLAX) packet Take 17 g by mouth daily as needed for mild constipation. 14 each 0   psyllium (METAMUCIL) 58.6 % powder Take 1 packet by mouth daily as needed (constipation).     temazepam (RESTORIL) 30 MG capsule Take 1 capsule (30 mg total) by mouth at bedtime as needed for sleep. 30 capsule 5   Vitamin D-Vitamin K (VITAMIN K2-VITAMIN D3 PO) Take 1-2 sprays by mouth daily.     No current facility-administered medications for this visit.    Medication Side Effects: None   Allergies:  Allergies  Allergen Reactions   Hydrocodone Itching   Sulfamethoxazole-Trimethoprim Itching   Dust Mite Extract Other (See Comments)    Sneezing, watery eyes, runny nose   Latex Itching   Other Other (See Comments)    PT IS ALLERGIC TO CAT DANDER AND RAGWEED - Sneezing, watery eyes, runny nose    Pollen Extract Other (See Comments)    Sneezing,  watery eyes, runny nose     Past Medical History:  Diagnosis Date   Abnormal Pap smear 2011   hpv/mild dysplasia,cin1   Anxiety    Cerebral palsy (HCC)    right arm/leg   Cystocele    Depression    Headache    Neuromuscular disorder (HCC)    Cerebral Palsy   OCD (obsessive compulsive disorder)    Osteoporosis    Uterine prolaps     Family History  Problem Relation Age of Onset   Cancer Father        skin AND LUNG   Alcohol abuse Sister        CRACK COCAINE    Social History   Socioeconomic History   Marital status: Married    Spouse name: Not on file   Number of children: Not on file   Years of education: Not on file   Highest education level: Not on file  Occupational History   Not on file  Tobacco Use   Smoking status: Never   Smokeless tobacco: Never  Substance and Sexual Activity   Alcohol use: Not Currently    Comment: OCCASIONAL beer   Drug use: No   Sexual activity: Yes    Birth control/protection: Pill    Comment: LOESTRIN 24 FE  Other Topics Concern   Not on file  Social History Narrative   Not on file   Social Determinants of Health   Financial Resource Strain: Not on file  Food Insecurity: Not on file  Transportation Needs: Not on file  Physical Activity: Not on file  Stress: Not on file  Social Connections: Not on file  Intimate Partner Violence: Not on file    Past Medical History, Surgical  history, Social history, and Family history were reviewed and updated as appropriate.   Please see review of systems for further details on the patient's review from today.   Objective:   Physical Exam:  LMP  (LMP Unknown)   Physical Exam Neurological:     Mental Status: She is alert and oriented to person, place, and time.     Cranial Nerves: No dysarthria.     Motor: Weakness present.     Gait: Gait abnormal.  Psychiatric:        Attention and Perception: Attention and perception normal.        Mood and Affect: Mood is anxious and  depressed. Affect is not labile or tearful.        Speech: Speech normal. Speech is not slurred.        Behavior: Behavior normal. Behavior is cooperative.        Thought Content: Thought content normal. Thought content is not delusional. Thought content does not include homicidal or suicidal ideation. Thought content does not include suicidal plan.        Cognition and Memory: Cognition and memory normal. Cognition is not impaired.        Judgment: Judgment normal.     Comments: Insight intact Pleasant. Ongoing OCD remains fairly severe but less anxious Checking compulsions  2 hours daily but improving noticeably Chronic depression persistent but better with Spravato      Lab Review:     Component Value Date/Time   NA 138 06/11/2021 0606   K 4.0 06/11/2021 0606   CL 107 06/11/2021 0606   CO2 26 06/11/2021 0606   GLUCOSE 90 06/11/2021 0606   BUN 18 06/11/2021 0606   CREATININE 0.81 06/11/2021 0606   CALCIUM 9.4 06/11/2021 0606   PROT 6.5 06/11/2021 0606   ALBUMIN 3.3 (L) 06/11/2021 0606   AST 17 06/11/2021 0606   ALT 14 06/11/2021 0606   ALKPHOS 141 (H) 06/11/2021 0606   BILITOT 0.2 (L) 06/11/2021 0606   GFRNONAA >60 06/11/2021 0606   GFRAA >60 07/09/2016 0438       Component Value Date/Time   WBC 5.8 06/11/2021 0606   RBC 4.12 06/11/2021 0606   HGB 12.5 06/11/2021 0606   HCT 39.7 06/11/2021 0606   PLT 299 06/11/2021 0606   MCV 96.4 06/11/2021 0606   MCH 30.3 06/11/2021 0606   MCHC 31.5 06/11/2021 0606   RDW 13.9 06/11/2021 0606   LYMPHSABS 1.9 06/11/2021 0606   MONOABS 0.5 06/11/2021 0606   EOSABS 0.1 06/11/2021 0606   BASOSABS 0.0 06/11/2021 0606    No results found for: "POCLITH", "LITHIUM"   No results found for: "PHENYTOIN", "PHENOBARB", "VALPROATE", "CBMZ"   .res Assessment: Plan:    Tzirel was seen today for follow-up, depression, anxiety and fatigue.  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  Impaired function but less so with Spravato re: depression..   She is receiving Spravato 84 mg weekly and marked improvement in the depression..  she feels it also helps OCD somewhat.  However still easily overwhelmed with low stress tolerance.  The OCD is improving some with the increase in fluvoxamine and with Spravato.  Spends 2 hours daily and checking compulsions.  She has been on higher doses of fluvoxamine above the  usual max of 400 mg daily in the past.  This became difficult to obtain at 1 point and the dose was reduced to 300 mg daily.   Disc SE. She would like to continue Luvox 400 mg daily again to see if her OCD can be better controlled   She is tolerating the meds well  Continue Increased Luvox back to 400 mg nightly as of the third week of January 2023. Consider reevaluating dose.  Disc dosing.  Sehe feels this is starting to help more with OCD which remains chronically severe.  increased Auvelity BID and reduce Wellbutrin to 150 AM on 04/13/22 We discussed potential side effects and drug interaction issues.  Disc Spravato DT TRD incl details and SE. Disc dosing and duration.  Pt with severe depression MADRS 36 on 06/29/21 Patient was administered Spravato 84 mg intranasally dosage today.  The patient experienced the typical dissociation which gradually resolved over the 2-hour period of observation.  There were no complications.  Specifically the patient did not have nausea or vomiting or headache.  Blood pressures remained within normal ranges at the 40-minute and 2-hour follow-up intervals.  By the time the 2-hour observation period was met the patient was alert and oriented and able to exit without assistance. She tends to have lingering sedative effects but not severe. .  See nursing note for further  details. Per protocol will continue Spravato to 84 mg next session.  She has been okay since cutting back to once weekly.  We discussed the short-term risks associated with benzodiazepines including sedation and increased fall risk among others.  Discussed long-term side effect risk including dependence, potential withdrawal symptoms, and the potential eventual dose-related risk of dementia.  But recent studies from 2020 dispute this association between benzodiazepines and dementia risk. Newer studies in 2020 do not support an association with dementia. For TR anxiety lorazepam to $RemoveBefo'2mg'[id number]$  AM and HS and 1 mg in afternoon.  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  Supportive therapy dealing with some of the recent stressors including family stressors and her own health related to cerebral palsy   Follow-up weekly .    Lynder Parents, MD, DFAPA  Please see After Visit Summary for patient specific instructions.  Future Appointments  Date Time Provider [place]  04/18/2022  1:30 PM Vedia Pereyra, [place] [facility]  05/09/2022 11:00 AM Blanchie Serve, PhD [facility] None  06/13/2022 11:00 AM Blanchie Serve, PhD [facility] None    No orders of the defined types were placed in this encounter.    -------------------------------

## 2022-04-15 ENCOUNTER — Other Ambulatory Visit: Payer: Self-pay | Admitting: Psychiatry

## 2022-04-15 DIAGNOSIS — F339 Major depressive disorder, recurrent, unspecified: Secondary | ICD-10-CM

## 2022-04-15 NOTE — Progress Notes (Signed)
NURSE Visit:   Pt arrived for her weekly Spravato Treatment, she started Spravato treatments on 10/07/2021, she continues with 84 mg (3 of the 28 mg) Spravato nasal spray. which is the maintenance dose for her treatment resistant depression.   She was directed to the treatment room to get vitals taken first. Pt's mood today was much better and seemed her normal self.  Her B/P at 3:15 PM 120/79, 91, Pt instructed to blow her nose and to recline back at 45 degrees. Pt given first nasal spray (28 mg) administered by pt observed by nurse. There were 5 minutes between each dose, total of 84 mg. Tolerated well. Pt's medication is delivered by [facility] in [place] and stored inside a safe behind a locked door as well. Spravato is a CIII medication and has to be only given at a treatment facility and observed by nurse as pt administered intranasally.  Pt's 40 minute vital signs at 4:00 PM 103/66, 82. Dr. Clovis Pu met with pt to discuss her care at the end of her treatment when her thoughts are clearer and they discussed her medication and moods. She does go to the bathroom at least once during her treatment. No sedation and had slight feeling of being "high" she reports.  Discharge vitals at 5:15 PM 105/67, 77. Pt stable for discharge, pt is scheduled next Thursday , July 6th. Pt was observed on site a total of 120 minutes per FDA/REMS requirements. Pt was with nurse for clinical assessment a total of 55 minutes.    LOT [id number] EXP 2025 APR

## 2022-04-15 NOTE — Progress Notes (Signed)
NURSE Visit:   Pt arrived for her weekly Spravato Treatment, she started Spravato treatments on 10/07/2021, she continues with 84 mg (3 of the 28 mg) Spravato nasal spray. which is the maintenance dose for her treatment resistant depression.   She was directed to the treatment room to get vitals taken first. We are getting started late today due some other issues at the office. Pt seems sad, tearful, and upset today. Pt reports she is worried about her brother who is bipolar not taking medications. Just listened and let her talk about the situation.  Her B/P at 4:00 PM 122/80, 85, Pt instructed to blow her nose and to recline back at 45 degrees. Pt given first nasal spray (28 mg) administered by pt observed by nurse. There were 5 minutes between each dose, total of 84 mg. Tolerated well. Pt's medication is delivered by [facility] in [place] and stored inside a safe behind a locked door as well. Spravato is a CIII medication and has to be only given at a treatment facility and observed by nurse as pt administered intranasally.  Pt's 40 minute vital signs at 4:40 PM 114/77, 79. Lesle Chris, NP met with pt to discuss her care at the end of her treatment when her thoughts are clearer and they discussed her dosing for Auvelity 45-105 mg, I did confirm with Dr. Clovis Pu she needs to increase dose to bid and decrease her Wellbutrin XL 150 mg to daily. She does go to the bathroom at least once during her treatment. No sedation and had slight feeling of being "high" she reports.  Discharge vitals at 6:00PM 120/78, 79. Pt stable for discharge, pt is scheduled next Thursday , June 29 th. Pt was observed on site a total of 120 minutes per FDA/REMS requirements. Pt was with nurse for clinical assessment a total of 55 minutes.    LOT [id number] EXP 2025 FEB

## 2022-04-17 ENCOUNTER — Other Ambulatory Visit: Payer: Self-pay | Admitting: Psychiatry

## 2022-04-17 DIAGNOSIS — F4010 Social phobia, unspecified: Secondary | ICD-10-CM

## 2022-04-17 DIAGNOSIS — F5105 Insomnia due to other mental disorder: Secondary | ICD-10-CM

## 2022-04-18 ENCOUNTER — Ambulatory Visit (HOSPITAL_BASED_OUTPATIENT_CLINIC_OR_DEPARTMENT_OTHER): Payer: 59 | Attending: Orthopedic Surgery | Admitting: Physical Therapy

## 2022-04-18 ENCOUNTER — Encounter (HOSPITAL_BASED_OUTPATIENT_CLINIC_OR_DEPARTMENT_OTHER): Payer: Self-pay | Admitting: Physical Therapy

## 2022-04-18 DIAGNOSIS — R2689 Other abnormalities of gait and mobility: Secondary | ICD-10-CM | POA: Diagnosis present

## 2022-04-18 DIAGNOSIS — M6281 Muscle weakness (generalized): Secondary | ICD-10-CM | POA: Insufficient documentation

## 2022-04-18 DIAGNOSIS — R262 Difficulty in walking, not elsewhere classified: Secondary | ICD-10-CM | POA: Diagnosis present

## 2022-04-18 NOTE — Telephone Encounter (Signed)
Please tell her to tr to decrease the number of these she takes daily by 1 tablet .  So if she is taking an average of 5 a day however cut it back to 4 daily.  She is taking an average of 4 a day however cut back to 3 daily.  I want to try to get the dosage down if possible.

## 2022-04-18 NOTE — Therapy (Signed)
OUTPATIENT PHYSICAL THERAPY TREATMENT NOTE PHYSICAL THERAPY DISCHARGE SUMMARY  Visits from Start of Care: 22  Current functional level related to goals / functional outcomes: Indep with all ADL's and functional mobility   Remaining deficits: Chronic pain   Education / Equipment: Management of condition; HEP   Patient agrees to discharge. Patient goals were met. Patient is being discharged due to meeting the stated rehab goals.   Patient Name: Casey Diaz MRN: [id number] DOB:[birth year], 56 y.o., female Today's Date: 04/18/2022  PCP: Shon Baton, MD REFERRING PROVIDER: Shon Baton, MD   PT End of Session - 04/18/22 1344     Visit Number 22    Number of Visits 22    Date for PT Re-Evaluation 04/22/22    PT Start Time 1330    PT Stop Time 9233    PT Time Calculation (min) 45 min    Activity Tolerance Patient tolerated treatment well    Behavior During Therapy [facility] for tasks assessed/performed                Past Medical History:  Diagnosis Date   Abnormal Pap smear 2011   hpv/mild dysplasia,cin1   Anxiety    Cerebral palsy ([place])    right arm/leg   Cystocele    Depression    Headache    Neuromuscular disorder (HCC)    Cerebral Palsy   OCD (obsessive compulsive disorder)    Osteoporosis    Uterine prolaps    Past Surgical History:  Procedure Laterality Date   COLPOSCOPY  2011   ELBOW SURGERY     left elbow-separation of bones -age 31   ELBOW SURGERY  2015   fell on ice- right elbow fracture   FRACTURE SURGERY     HAMMER TOE SURGERY  1/13   RIGHT SIDE   HIP PINNING,CANNULATED Left 07/08/2016   Procedure: LEFT CLOSED REDUCTION HIP AND PERCUTANEOUS SCREW;  Surgeon: Paralee Cancel, MD;  Location: [facility];  Service: Orthopedics;  Laterality: Left;   ORIF HUMERUS FRACTURE Right 06/11/2021   Procedure: OPEN REDUCTION INTERNAL FIXATION (ORIF) DISTAL HUMERUS FRACTURE;  Surgeon: Altamese , MD;  Location: [place];  Service: Orthopedics;  Laterality: Right;    WRIST SURGERY  2005   left wrist   Patient Active Problem List   Diagnosis Date Noted   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   Relationship problem with family member 08/03/2018   Femoral neck fracture, left, closed, initial encounter 07/08/2016   Severe recurrent major depression without psychotic features ([place]) 06/03/2015    Class: Chronic   Obsessive-compulsive disorder 06/03/2015    Class: Chronic    REFERRING DIAG: M54.51 (ICD-10-CM) - Vertebrogenic low back pain   THERAPY DIAG:  Muscle weakness (generalized)  Difficulty in walking, not elsewhere classified  Other abnormalities of gait and mobility  PERTINENT HISTORY: CP OPEN REDUCTION INTERNAL FIXATION (ORIF) DISTAL HUMERUS FRACTURE IM nail Left hip 2017 Osteoporosis  PRECAUTIONS: fall  SUBJECTIVE:  Pt reports back is stiff   PAIN:  Are you having pain? no NPRS scale: 0/10 current Pain location:  Rt ankle, low back PAIN TYPE: aching, sore Pain description: constant  Aggravating factors: walking  Relieving factors: rest    PRECAUTIONS: Fall   WEIGHT BEARING RESTRICTIONS No   FALLS:  Has patient fallen in last 6 months? No, Number of falls: 1 in Aug 2022   LIVING ENVIRONMENT: Lives with: lives with their family Lives in: House/apartment Stairs: Yes; Internal: 15 steps;  none Has following equipment at home: None     PLOF: Independent with household mobility without device   PATIENT GOALS to get stronger, more coordinated and better balanced.     OBJECTIVE: * Findings [facility] at [name] unless otherwise noted.      PATIENT SURVEYS:  FOTO 44 with goal of 50 01/20/22: 32     COGNITION:          Overall cognitive status: Within functional limits for tasks assessed                        SENSATION:          intact   MUSCLE LENGTH: Hamstrings: Right 70 deg; Left 90 deg Thomas test: Right neg deg; Left neg deg   POSTURE:   left shoulder  elevation, weightbearing shifted left   PALPATION: Tenderness bilat sub occipital and upper traps   LUMBARAROM/PROM   A/PROM A/PROM  12/15/2021  Flexion Forward reach 4 inches to floor  Extension    Right lateral flexion    Left lateral flexion    Right rotation    Left rotation     (Blank rows = not tested)   LE AROM/PROM:   A/PROM Right 12/15/2021 Left 12/15/2021 Right 01/20/22 Left 01/20/22 Right/Left 03/18/22 Right   Hip flexion 90 105   105/114 112d  Hip extension          Hip abduction _0 35 35/40   Hip adduction          Hip internal rotation          Hip external rotation          Knee flexion 105 120      Knee extension          Ankle dorsiflexion 5        Ankle plantarflexion 10        Ankle inversion          Ankle eversion           (Blank rows = not tested)   LE MMT:   MMT Right 12/15/2021 Left 12/15/2021  Hip flexion 5 4  Hip extension 4 4  Hip abduction 5 5  Hip adduction 5 5  Hip internal rotation      Hip external rotation      Knee flexion 5 5  Knee extension      Ankle dorsiflexion      Ankle plantarflexion      Ankle inversion      Ankle eversion       (Blank rows = not tested)   LUMBAR SPECIAL TESTS:  Straight leg raise test: Negative, Slump test: Negative, and Thomas test: Negative   FUNCTIONAL TESTS:  5 times sit to stand: 20 Timed up and go (TUG): 13 Berg Balance Scale: 36/56  03/18/22: 41/56; 04/18/22 50/56  02/01/22:  5x STS  = 12.09 sec (no UE support)   GAIT: Distance walked: 545f Assistive device utilized: None Level of assistance: Complete Independence Comments: right trendelenburg, left leaning, no heel strike or knee extension right, shortened step length      TODAY'S TREATMENT  Pt seen for aquatic therapy today.  Treatment took place in water 3.25-4.8 ft [facility] Temp of water was 91.  Pt entered/exited the pool via stairs (step to pattern) independently with single rail.  Warm up:  forward / backward walking, side stepping with arm abdct/  add Side step squat with arms abdct / add with rainbow hand buoys Holding rainbow hand buoys under water:  forward marching STS on 4th step x 10; with adduction set x10; STS on 5th step x 4 (challenging) Straddling yellow noodle:  cycling, varied speeds, ski, hip abdct/add Holding yellow noodle: SLS with opp leg in IR/ER Forward walking lunges for hip flexor stretch-holding yellow noodle UE pull ups with hands on rail in 57f 8" area, feet on bottom stair cutout x 5 (challenging) R lateral step ups with UE on rail x 18 Orange weighted ball throws against wall, with focus on pushing through RUE Walking between exercises for recovery  Pt requires buoyancy for support and to offload joints with strengthening exercises. Viscosity of the water is needed for resistance of strengthening; water current perturbations provides challenge to standing balance unsupported, requiring increased core activation.  PATIENT EDUCATION:  Education details: Condition management;  progression of exercise Person educated: Patient Education method: Explanation Education comprehension: verbalized understanding     HOME EXERCISE PROGRAM: [facility] [email or web address] Date: 12/15/2021 Prepared by: FDenton Meek  Exercises Supine Lower Trunk Rotation - 1 x daily - 7 x weekly - 3 sets - 10 reps Supine Single Knee to Chest Stretch - 1 x daily - 7 x weekly - 3 sets - 10 reps   ASSESSMENT:   CLINICAL IMPRESSION: All goals met.  Pt has written HEP and demonstrates indep with. She will begin the transitional program.  Has received all instructions. [place] by memory   OBJECTIVE IMPAIRMENTS Abnormal gait, decreased activity tolerance, decreased balance, decreased coordination, decreased mobility, difficulty walking, decreased strength, and pain.    ACTIVITY LIMITATIONS  cleaning, community activity, yard work, and shopping.    PERSONAL FACTORS Time since onset of injury/illness/exacerbation and 3+ comorbidities:    are also affecting patient's functional outcome.      REHAB POTENTIAL: Good   CLINICAL DECISION MAKING: Evolving/moderate complexity   EVALUATION COMPLEXITY: Moderate     GOALS: Goals reviewed with patient? Yes   SHORT TERM GOALS:   STG Name Target Date Goal status  1 Pt will tolerate entire sessions of aquatic therapy without increase in cervical or LBP Baseline:  01/05/2022  Achieved 01/20/22  2 Pt will improve bilat hip abduction by 5d Baseline: R 25d; L30d 01/05/2022  Achieved 04/18/22  3 Pt will improve bilat hip flex to 110d Baseline:R 90d; L 105d 7/723  Achieved 01/20/22    LONG TERM GOALS:    LTG Name Target Date Goal status  1 Pt will improve on FOTO  to 50 to demonstrate improved functional status Baseline:44 01/26/2022  Achieved 01/20/22  2 Pt will improve on Berg Balance Test to >or= 46/56 to demonstrate improvement in balance and decreased fall risk Baseline:36/56 7/723  Achieved 04/18/22   3 Pt will improve on 5 X STS test to <or= 15 to demonstrate improvement in LE strength Baseline:12.09 seconds 02/01/22 03/03/2022 Achieved 02/01/22    4 Pt will decrease overall pain in cervical spine and LB to 4/10 or< for improved toleration to activity Baseline: 04/22/22  Achieved 04/18/22   5   Pt to be indep with final aquatic HEP 04/22/22  Achieved 04/18/22         PLAN: PT FREQUENCY: 1/week   PT DURATION: 5 weeks   PLANNED INTERVENTIONS: Therapeutic exercises, Therapeutic activity, Neuromuscular re-education, Balance training, Gait training, Patient/Family education, Joint manipulation, Joint mobilization, Stair training, Aquatic  Therapy, Taping, and Manual therapy   PLAN FOR NEXT SESSION: Progress HEP, prepare for transition to community program.  D/C at next visit. Assess goals _ BERG and hip ROM; issue HEP.   Stanton Kidney  Tharon Aquas) Cyncere Sontag MPT 04/18/22 6:53 PM

## 2022-04-20 ENCOUNTER — Telehealth: Payer: Self-pay

## 2022-04-20 NOTE — Telephone Encounter (Signed)
Called pharmacy and authorized early RF.

## 2022-04-20 NOTE — Telephone Encounter (Signed)
Yes.  Mention to pharmacy RF early due to vacation out of the country

## 2022-04-21 ENCOUNTER — Ambulatory Visit (INDEPENDENT_AMBULATORY_CARE_PROVIDER_SITE_OTHER): Payer: 59 | Admitting: Psychiatry

## 2022-04-21 ENCOUNTER — Ambulatory Visit: Payer: 59

## 2022-04-21 ENCOUNTER — Encounter: Payer: Self-pay | Admitting: Psychiatry

## 2022-04-21 VITALS — BP 137/66 | HR 93

## 2022-04-21 DIAGNOSIS — F339 Major depressive disorder, recurrent, unspecified: Secondary | ICD-10-CM

## 2022-04-21 DIAGNOSIS — F4010 Social phobia, unspecified: Secondary | ICD-10-CM | POA: Diagnosis not present

## 2022-04-21 DIAGNOSIS — F422 Mixed obsessional thoughts and acts: Secondary | ICD-10-CM

## 2022-04-21 DIAGNOSIS — F5105 Insomnia due to other mental disorder: Secondary | ICD-10-CM

## 2022-04-21 NOTE — Progress Notes (Signed)
Casey Diaz [id number] 09-Jan-1966 56 y.o.    Subjective:   Patient ID:  Casey Diaz is a 56 y.o. (DOB Dec 07, 1965) female.  Chief Complaint:  Chief Complaint  Patient presents with   Follow-up   Depression   Anxiety   Fatigue     HPI Casey Diaz presents to the office today for follow-up of OCD and severe anxiety.     December 2019 visit the following was noted: No meds were changed. Lives in [place] and back for followup.  Sx are about the same.  Has to take meds with different sizes. Pt reports that mood is Anxious and Depressed and describes anxiety as Severe. Anxiety symptoms include: Excessive Worry, Obsessive Compulsive Symptoms:   Checking,,. Pt reports has interrupted sleep and nocturia. Pt reports that appetite is good. Pt reports that energy is no change and down slightly. Concentration is down slightly. Suicidal thoughts:  denied by patient. Loves the environment of [place] but misses some things there.  She's not able to work there.  H works there and likes it.  Struggled with not working, feels isolated and not up to task of meeting people.  Does attend a church and met a friend who's been helpful.  Leaving for [place] on 10/16/18.   04/09/2020 appointment the following is noted:  Staying another year in [place] bc Covid and other things. Last few months a lot of crying spells.  Is in menopause. Wonders about med changes though is nervous about it.  Crying spells associated with depressing thoughts more than stress or OCD.   Covid really hard on everyone and couldn't see family for 18 mos.  Family still very dysfunctional. No close friends in part due to OCD and depression. Son high Autism spectrum with ADHD and anxiety and she's with him all the time. Greater health problems with CP so more pains.   05/15/20 appt with the following noted: Casey Diaz for menopause and helps some. Still depressed.  Chronically. In [place] for 2 more weeks then to [place] for  another year. A lot of stressors lately triggering more checking and anxiety.   OCD is her CC now and seems.  Got worse DT stress.   Stressed with Asberger's son and her health.  H works a lot.  Her FOO still stress. Plan: Trintellix 10 mg 1 tablet in the morning with food and reduce fluvoxamine to 5 tablets nightly for 1 week  then reduce it to 4 tablets nightly.   07/02/20 appt with the following noted: Decided not to get Trintellix bc difficulty getting it. It is available.  There.  Wants to start it now.   Both depression and OCD are severe.  Not suicidal in intent or plan. Did not take samples with her to [place] but will be back in December. covid is worse there and travel is difficult.  Wants to reduce Wellbutrin DT dry mouth. Plan: She's afraid to reduce Luvox at this time DT fear of worsening OCD.  But will consider. Trintellix 10 mg 1 tablet in the morning with food and reduce fluvoxamine to 5 tablets nightly for 1 week  then reduce it to 4 tablets nightly. Also reduce Wellbutrin XL to 300 mg daily.    9-13 2022 appointment with the following noted: Back in [place] since July 14.  Broke arm a month ago and surgery.  It's all been rough adjustment.   B has cancer on his face and M fell taking him to the doctor.  Misses the water and weather of [place].   Cry a lot more since menopause. Still depression and anxiety and OCD.  Asks about ketamine. On Wellbutrin 300, Luvox 300.  No Trintellix. Added Ativan 2 mg AM and HS and it helps.  More likely to get upset at night. Plan: Increase Luvox back to 400 mg daily.  She thinks she's worse on less. Continue Wellbutrin XL to 300 mg daily. 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  She tolerated it well without unusual nausea or vomiting headache or other somatic symptoms.  She did have the expected dissociation which gradually resolved over  the course of the 2-hour period of observation.  She was a little concerned about her balance given her cerebral palsy but has not noted unusual or unexpected problems.  She is motivated to can continue Spravato in hopes of reducing her depressive symptoms. 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  She also has treatment resistant OCD which is partially managed with medications but is still quite disabling.  She is tolerating the medications well.  She is sleeping adequately.  Her appetite is adequate.  She is not having suicidal thoughts.  She continues to wish for a better treatment for OCD that would give her some relief.  09/30/2021 appointment with the following noted: She received her first dose of Spravato 84 mg intranasally today.  She tolerated it well without unusual nausea, vomiting, or other somatic symptoms.  Dissociation as expected did occur and gradually resolved over the 2-hour period of observation.  She did have a mild headache today with the treatment and received ibuprofen 600 mg at her request.  We will follow this to see if it is a pattern Patient is still depressed.  She said she was late with her medicine today and today was a particularly depressing day.  However she notes that the Spravato has lifted her mood considerably even today.  She is hopeful that it will continue to be helpful.  No suicidal thoughts.  She has ongoing chronic anxiety and OCD at baseline.  10/04/21 appt noted: Patient received Spravato 84 mg for the second time today.  She tolerated it well without any unusual headache, nausea or vomiting or other somatic symptoms.  Dissociation did occur and she gradually [place] resolution over the 2-hour period of observation. She did not have any unusual problems after she left the office last Spravato administration.  She did not have any specific problems with balance or walking.  She is at increased risk of that difficulty because of  cerebral palsy.  So far she has not noticed much mood effect from the medication beyond the first day of receiving it.  However she would like to continue Spravato in hopes of getting the antidepressant effect that is desired. Stress dealing with mother's behavior at party pt hosted.  Guilt over it.  10/07/2021 appointment noted: Patient received Spravato 84 mg for the second time today.  She tolerated it well without any unusual headache, nausea or vomiting or other somatic symptoms.  Dissociation did occur and she gradually [place] resolution over the 2-hour period of observation. She still is not sure about the antidepressant effect of Spravato.  Events over the holidays and demands, make it difficult to assess.  She still notes that the OCD tends to worsen the depression and vice versa.  She tolerates the  Spravato well and wants to continue the trial.  10/15/2021 appointment with the following noted: Patient received Spravato 84 mg for the second time today.  She tolerated it well without any unusual headache, nausea or vomiting or other somatic symptoms.  Dissociation did occur and she gradually [place] resolution over the 2-hour period of observation. Patient says it was somewhat difficult to evaluate the effect of the Spravato.  It was scheduled to be twice weekly for 4 weeks consecutively but the holidays have interfered with that administration.  She asked what specifically should be she should be looking for in order to assess improvement.  That was discussed.  The OCD is unchanged and the depression so far is not significantly different.  She still tolerates meds.  There have been no recent med changes  10/19/2021 appt noted: Patient received Spravato 84 mg for the second today.  She tolerated it well without any unusual headache, nausea or vomiting or other somatic symptoms.  Dissociation did occur and she gradually saw resolution over the 2-hour period of observation.   10/21/2021 appointment  noted: Patient received Spravato 84 mg today.  She tolerated it well without any unusual headache, nausea or vomiting or other somatic symptoms.  Dissociation did occur and she gradually saw resolution over the 2-hour period of observation.  She feels better than last week.  She is not as depressed and down.  She is still dealing with grief around the death of her cousin that was unexpected.  It is still difficult to tell how much the Spravato was doing but she is hopeful.  Anxiety is still present with the OCD.  She is not having suicidal thoughts.  She is not hopeless.  She wants to continue treatment.  10/25/2021 appointment with the following noted: Patient received Spravato 84 mg today.  She tolerated it well without any unusual headache, nausea or vomiting or other somatic symptoms.  Dissociation did occur and she gradually saw resolution over the 2-hour period of observation.  She does not typically find the dissociation very strong. She is beginning to think the Spravato is helping somewhat with the depression.  It has been difficult to tell with the holidays intervening as well as the death of her cousin.  She has not been able to get Spravato twice weekly for 4 weeks straight as typically planned.  However she is hopeful.  The OCD remains significant.  She still has a tendency to think very negatively.  She is not suicidal.  10/28/2021 appointment with the following noted: Patient received Spravato 84 mg today.  She tolerated it well without any unusual headache, nausea or vomiting or other somatic symptoms.  Dissociation did occur and she gradually saw resolution over the 2-hour period of observation.  She does not typically find the dissociation very strong. She is feeling more hopeful about the administration of Spravato.  She is having less depression she believes.  Still not dramatically different.  She still has a tendency to have a lot of anxiety and rumination and OCD.  She is not suicidal.   She is eager to continue the Spravato.  11/01/2021 appointment with the following noted: Patient received Spravato 84 mg today.  She tolerated it well without any unusual headache, nausea or vomiting or other somatic symptoms.  Dissociation did occur and she gradually saw resolution over the 2-hour period of observation.  She does not typically find the dissociation very strong. She is continuing to see a little bit of improvement in depression  with Spravato.  The anxiety remains but may be not as severe.  The OCD remains markedly severe chronically.  She is not suicidal.  She is encouraged by the degree of improvement with Spravato and inability to enjoy things more and not be quite as ruminative.  11/04/2021 appt noted: Patient received Spravato 84 mg today.  She tolerated it well without any unusual headache, nausea or vomiting or other somatic symptoms.  Dissociation did occur and she gradually saw resolution over the 2-hour period of observation.  She does not typically find the dissociation very strong. No SE complaints with meds. She continues to feel hopeful about the Spravato.  She has less depression.  Because of a number of factors she is uncertain of the full benefit but thinks she is somewhat less depressed.  Her anxiety and OCD remain significant but a little better.  She is tolerating the medications and does not desire medicine change.  She is not currently complaining of insomnia.   11/08/2021 appointment the following noted: Patient received Spravato 84 mg today.  She tolerated it well without any unusual headache, nausea or vomiting or other somatic symptoms.  Dissociation did occur and she gradually saw resolution over the 2-hour period of observation.  She does not typically find the dissociation very strong. No SE complaints with meds. She feels the Spravato is helping somewhat.  She would like to see a greater effect.  However she is able to enjoy things.  She is productive at home.   She would like to see a lifting of a degree of sadness that remains.  The anxiety and OCD remained largely unchanged.  She wondered about the dosing of Wellbutrin 300 mg a day and Luvox 300 mg a day and possible increases.  She has been at higher doses in the past.  She plans to start water therapy for her weakness and for her shoulder.  11/11/2021 appointment with the following noted: Patient received Spravato 84 mg today.  She tolerated it well without any unusual headache, nausea or vomiting or other somatic symptoms.  Dissociation did occur and she gradually saw resolution over the 2-hour period of observation.  She does not typically find the dissociation very strong. No SE complaints with meds. She feels the Spravato is clearly helping the depression.  She would like to see a more significant effect.  She is still having trouble thinking positive. Her energy is fair.  Concentration is good except for the problem with chronic obsessions. She has been taking Wellbutrin 300 mg in Luvox 300 mg for quite some time but has taken higher doses in the past.  We discussed that.  She would like to try higher doses in order to get a better effect if possible. We just increased the doses a couple of days ago.  No effect yet.  11/15/2021 appointment with the following noted: Patient received Spravato 84 mg today.  She tolerated it well without any unusual headache, nausea or vomiting or other somatic symptoms.  Dissociation did occur and she gradually saw resolution over the 2-hour period of observation.  She does not typically find the dissociation very strong. No SE complaints with meds. The patient is now convinced that the Spravato is helping the depression.  She would like to continue twice weekly Spravato this week if possible.  She has tolerated the increase in Wellbutrin to 450 mg daily and the increase and fluvoxamine to 400 mg daily without complications thus far.  The OCD and anxiety feed  the  depression to some extent. She spends approximately 2 hours daily with checking compulsions due to obsessions about causing harm to others.  For example fearing that when she has hit a pot hole that she may have hit a person and going back to check.  Checking corners and rooms out of fear that she may have harmed someone.  Other various checking compulsions.  She is hoping the increase in fluvoxamine to 400 mg will reduce that over the weeks to come.  She is not seeing a significant difference with the addition of the Spravato though she understands that was not expected.  She is more productive at home and more motivated and able to enjoy things more fully as a result of the Spravato treatment.  She is tolerating the medication  11/18/2021 appointment with the following noted: Patient received Spravato 84 mg today.  She tolerated it well without any unusual headache, nausea or vomiting or other somatic symptoms.  Dissociation did occur and she gradually saw resolution over the 2-hour period of observation.  She does not typically find the dissociation very strong. No SE complaints with meds. She clearly believes the Spravato has been helpful for the depression.  She wonders whether to continue to treatments weekly or to cut back to 1 weekly.  She would like to continue twice weekly in hopes of getting additional improvement in the depression because it is not resolved but it is difficult to get here twice a week in terms of arranging rides. She is recently increased Wellbutrin XL to 450 mg daily and fluvoxamine to 400 mg daily but they have not had time to have an official effect.  She is tolerating that well.  She is tolerating meds overwork overall well. The OCD remains the same as noted on 11/15/2021  11/25/21 appt noted: Patient received Spravato 84 mg today.  She tolerated it well without any unusual headache, nausea or vomiting or other somatic symptoms.  Dissociation did occur and she gradually saw  resolution over the 2-hour period of observation.  She does not typically find the dissociation very strong. No SE complaints with meds. She thinks the increase in Wellbutrin and Luvox have been potentially helpful for depression and OCD respectively.  It has been too early to see the full effect.  She is sleeping and eating well.  She is functioning at home.  She still spends a lot of time that is about 2 hours a day dealing with compulsive behaviors.  12/02/21 appt noted: Patient received Spravato 84 mg today.  She tolerated it well without any unusual headache, nausea or vomiting or other somatic symptoms.  Dissociation did occur and she gradually saw resolution over the 2-hour period of observation.  She does not typically find the dissociation very strong. No SE complaints with meds. Several losses and stressors recently that affect her sense of mood. However still sees significant benefit from the Spravato for her depression.  Wants to continue it. Suspect early  some benefit from the increased Wellbutrin for depression and Luvox for OCD. Tolerating meds. No complaints about the meds. Sleeping and eating well.  No new health concerns.  12/09/21 appt noted: Patient received Spravato 84 mg today.  She tolerated it well without any unusual headache, nausea or vomiting or other somatic symptoms.  Dissociation did occur and she gradually saw resolution over the 2-hour period of observation.  She does not typically find the dissociation very strong. No SE complaints with meds. Seeing noticeable improvement from  increase fluvoxamine to 400 mg daily.  Tolerating meds without concerns over them. Depression is stable with residual sx of easy guilt and easily stressed.  OCD contributes to depression but depression is not severe with less crying spells.  Productive at home with chores.  Enjoyed recent birthday.  Sleeping good. No new concerns.  12/23/2021 appointment noted: Patient received Spravato 84 mg  today.  She tolerated it well without any unusual headache, nausea or vomiting or other somatic symptoms.  Dissociation did occur and she gradually saw resolution over the 2-hour period of observation.  She does not typically find the dissociation very strong. No SE complaints with meds. Seeing noticeable improvement from increase fluvoxamine to 400 mg daily.  Tolerating meds without concerns over them. Her depression is somewhat improved with the Spravato.  She also feels generally a little lighter.  She is more motivated.  She is less overwhelmed by guilt.  The OCD is gradually improving but is still quite time-consuming as noted before.  She is sleeping well.  No side effects  12/30/2021 appointment with the following noted: Patient received Spravato 84 mg today.  She tolerated it well without any unusual headache, nausea or vomiting or other somatic symptoms.  Dissociation did occur and she gradually saw resolution over the 2-hour period of observation.  She does not typically find the dissociation very strong. No SE complaints with meds. Seeing noticeable improvement from increase fluvoxamine to 400 mg daily.  Tolerating meds without concerns over them. She is confident of her the improvement seen with Spravato.  She is less hopeless.  Guilt is marked remarkably improved.  She is not having any thoughts of death or dying.  She is more motivated for activities such as exercise which she is recently started.  She is sleeping well. The OCD remains severe but it is improving somewhat with the increase in fluvoxamine.  It is still consuming a couple hours per day.  01/10/22 apravato 84 admin  01/24/22 appt noted: Patient received Spravato 84 mg today.  She tolerated it well without any unusual headache, nausea or vomiting or other somatic symptoms.  Dissociation did occur and she gradually saw resolution over the 2-hour period of observation.  She does not typically find the dissociation very strong. No  SE complaints with meds. Very tearful today.  Feels like she has been suppressing emotion in the Spravato caused it to be released.  Discussed some stressors.  Overall still feels the medicine is helpful.  She has missed some of the scheduled Spravato treatments that were intended to be weekly due to circumstances beyond her control.  She is still struggling with OCD as previously noted but does believe the medications are helpful. Plan no med changes  01/31/2022 received Spravato 84 mg today  02/09/2022 appointment with the following noted: Patient received Spravato 84 mg today.  She tolerated it well without any unusual headache, nausea or vomiting or other somatic symptoms.  Dissociation did occur and she gradually saw resolution over the 2-hour period of observation.  She does not typically find the dissociation very strong. No SE complaints with meds. Spravato clearly helps depression and OCD but easily gets overwhelmed and tearful with fairly routine stressors.  Tolerating meds. Sleep and appetite is OK Asks to increase lorazepam to 2 mg AM and HS and 67m afternoon  02/16/22 appt noted: Patient received Spravato 84 mg today.  She tolerated it well without any unusual headache, nausea or vomiting or other somatic symptoms.  Dissociation did  occur and she gradually saw resolution over the 2-hour period of observation.  She does not typically find the dissociation very strong. No SE complaints with meds. She has chronic depesssion and OCD but is improved with Spravato, both dx versus before.  She has continued Luvox 400 mg and Wellbutrin 450 mg and is tolerating it.  Chronically easily stressed.  Tolerating all meds.  Doesn't like taking more meds.  Spending a couple hours daily with OCD.  No SI No med changes.  02/21/22 appt noted:   Doesn't like taking more meds.  Spending a couple hours daily with OCD.  No SI No med changes.  02/21/22 appt noted: Patient received Spravato 84 mg today.  She  tolerated it well without any unusual headache, nausea or vomiting or other somatic symptoms.  Dissociation did occur and she gradually saw resolution over the 2-hour period of observation.  She does not typically find the dissociation very strong. No SE complaints with meds. She has chronic depesssion and OCD but is improved with Spravato, both dx versus before.  She has continued Luvox 400 mg and Wellbutrin 450 mg and is tolerating it.  Chronically easily overwhelmed and doesn't know why.  Tolerating all meds. Wants to continue meds.  03/16/22 appt noted: Patient received Spravato 84 mg today.  She tolerated it well without any unusual headache, nausea or vomiting or other somatic symptoms.  Dissociation did occur and she gradually saw resolution over the 2-hour period of observation.  She does not typically find the dissociation very strong. No SE complaints with meds. Overall she still feels the Spravato has been helpful not only for her depression but also for her OCD which was somewhat unexpected.  OCD is still significant but it is less severe than prior to starting Spravato.  She is tolerating Luvox 400 mg and Wellbutrin 450 mg.  We discussed possible med adjustments.  03/23/22 appt noted: Patient received Spravato 84 mg today.  She tolerated it well without any unusual headache, nausea or vomiting or other somatic symptoms.  Dissociation did occur and she gradually saw resolution over the 2-hour period of observation.  She does not typically find the dissociation very strong. No SE complaints with meds. She is still depressed and still has OCD of course but is improved with the Spravato.  She is tolerating the medications well.  We had previously discussed the possibility of switching some of the Wellbutrin to [name] and she is very interested in that in hopes of further improvement in depression and OCD.  She understands that Auvelity is not used for OCD on the label.  She is tolerating the  medications.  She is still easily overwhelmed.  She is sleeping and eating okay.. Plan: Reduce Wellbutrin XL to 300 mg AM and add Auvelity 1 tablet each AM  03/30/22 appt noted: Patient received Spravato 84 mg today.  She tolerated it well without any unusual headache, nausea or vomiting or other somatic symptoms.  Dissociation did occur and she gradually saw resolution over the 2-hour period of observation.  She does not typically find the dissociation very strong. No SE complaints with meds. She is still depressed and still has OCD of course but is improved with the Spravato.  She is tolerating the medications well.  No difference with Auvelity 1 AM so far and no SE.  Going on vacation on Saturday. 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 Sleep and appetite good. Plan: Increase Auvelity to 1 twice daily and reduce Wellbutrin  to XL 150 every morning  04/14/2022 appointment with the following noted: Patient received Spravato 84 mg today.  She tolerated it well without any unusual headache, nausea or vomiting or other somatic symptoms.  Dissociation did occur and she gradually saw resolution over the 2-hour period of observation.  She does not typically find the dissociation very strong. No SE complaints with meds. She is still depressed and still has OCD of course but is improved with the Spravato.  She is tolerating the medications well.  Just increased Auvelity to BID yesterday and reduced Wellbutrin to 150 AM. No SE so far.  No change in mood or anxiety so far.  Chronic OCD as noted and residual depression and chronic fatigue.  04/21/2022 appointment with the following noted: Patient received Spravato 84 mg today.  She tolerated it well without any unusual headache, nausea or vomiting or other somatic symptoms.  Dissociation did occur and she gradually saw resolution over the 2-hour period of observation.  She does not typically find the dissociation very strong. No SE complaints with  meds. She is still depressed and still has OCD of course but is improved with the Spravato.   She has questions about the dosing of lorazepam. She tends to have negative anxious thoughts at night.  This tends to interfere with her ability to go to sleep.  She is getting about 8 to 9 hours of sleep.  She is tolerating the meds without excessive sedation and does not nap during the day.  Previous psych med trials include Prozac, paroxetine, sertraline, fluvoxamine, venlafaxine, Anafranil with no response,  Wellbutrin, , Viibryd, Trintellix 10 1 month NR Geodon,  risperidone, Rexulti, Abilify,  Seroquel, Latuda 40 mg with irritability.  lamotrigine lithium,  BuSpar, Namenda,  pramipexole with no response, and Topamax, pindolol  ECT-MADRS    [place] Office Visit from 06/29/2021 in [place] Total Score 36      Flowsheet Row Admission (Discharged) from 06/11/2021 in [place] No Risk        Review of Systems:  Review of Systems  Constitutional:  Positive for fatigue.  Musculoskeletal:  Positive for arthralgias, back pain and gait problem. Negative for joint swelling.  Neurological:  Positive for weakness. Negative for tremors.  Psychiatric/Behavioral:  Positive for dysphoric mood. Negative for agitation and suicidal ideas. The patient is nervous/anxious.     Medications: I have reviewed the patient's current medications.  Current Outpatient Medications  Medication Sig Dispense Refill   Abaloparatide (TYMLOS) 3120 MCG/1.56ML SOPN Inject into the skin.     Azelastine-Fluticasone 137-50 MCG/ACT SUSP Place 1-2 sprays into both nostrils daily.     baclofen (LIORESAL) 10 MG tablet Take 20 mg by mouth at bedtime as needed for muscle spasms.     buPROPion (WELLBUTRIN XL) 150 MG 24 hr tablet TAKE 3 TABLETS BY MOUTH DAILY. (Patient taking differently: Take 150 mg by mouth daily.) 270 tablet 0   buPROPion (WELLBUTRIN XL)  300 MG 24 hr tablet TAKE 1 TABLET BY MOUTH EVERY DAY 90 tablet 0   Dextromethorphan-buPROPion ER (AUVELITY) 45-105 MG TBCR Take 1 tablet by mouth every morning.     dicyclomine (BENTYL) 10 MG capsule Take 10 mg by mouth daily.     docusate sodium (COLACE) 100 MG capsule Take 1 capsule (100 mg total) by mouth 2 (two) times daily. (Patient taking differently: Take 100 mg by mouth daily.) 10 capsule 0   Esketamine HCl, 84 MG  Dose, (SPRAVATO, 84 MG DOSE,) 28 MG/DEVICE SOPK USE 3 SPRAYS IN EACH NOSTRIL ONCE A WEEK 3 each 3   fexofenadine (ALLEGRA) 180 MG tablet Take 180 mg by mouth daily.     fluvoxaMINE (LUVOX) 100 MG tablet Take 3 tablets (300 mg total) by mouth at bedtime. 270 tablet 0   fluvoxaMINE (LUVOX) 100 MG tablet Take 1 tablet (100 mg total) by mouth at bedtime. 90 tablet 0   fluvoxaMINE (LUVOX) 100 MG tablet Take one tablet (100 mg) by mouth every morning and 3 tablets (300 mg) at bedtime. 120 tablet 5   hydrocortisone (ANUSOL-HC) 2.5 % rectal cream Place rectally 2 (two) times daily. x 7-14 days 30 g 0   ketotifen (ZADITOR) 0.025 % ophthalmic solution Place 3 drops into both eyes at bedtime.     LORazepam (ATIVAN) 1 MG tablet TAKE 1-2 IN THE AM AND 1-2 TABLETS EVERY NIGHT AT BEDTIME AND 1 TABLET IN AFTERNOON when needed for anxiety and sleep 150 tablet 3   magnesium gluconate (MAGONATE) 500 MG tablet Take 500 mg by mouth daily.     MIBELAS 24 FE 1-20 MG-MCG(24) CHEW Chew 1 tablet by mouth at bedtime as needed (bowel regularity).     Multiple Vitamins-Minerals (ADULT GUMMY PO) Take 2 tablets by mouth in the morning.     nitrofurantoin (MACRODANTIN) 100 MG capsule Take 100 mg by mouth as needed (For urinary tract infection.).      polyethylene glycol (MIRALAX / GLYCOLAX) packet Take 17 g by mouth daily as needed for mild constipation. 14 each 0   psyllium (METAMUCIL) 58.6 % powder Take 1 packet by mouth daily as needed (constipation).     temazepam (RESTORIL) 30 MG capsule TAKE 1 CAPSULE BY  MOUTH AT BEDTIME AS NEEDED FOR SLEEP. 30 capsule 0   Vitamin D-Vitamin K (VITAMIN K2-VITAMIN D3 PO) Take 1-2 sprays by mouth daily.     oxyCODONE-acetaminophen (PERCOCET/ROXICET) 5-325 MG tablet Take 1-2 tablets by mouth every 6 (six) hours as needed for severe pain. (Patient not taking: Reported on 04/21/2022) 50 tablet 0   No current facility-administered medications for this visit.    Medication Side Effects: None   Allergies:  Allergies  Allergen Reactions   Hydrocodone Itching   Sulfamethoxazole-Trimethoprim Itching   Dust Mite Extract Other (See Comments)    Sneezing, watery eyes, runny nose   Latex Itching   Other Other (See Comments)    PT IS ALLERGIC TO CAT DANDER AND RAGWEED - Sneezing, watery eyes, runny nose    Pollen Extract Other (See Comments)    Sneezing, watery eyes, runny nose     Past Medical History:  Diagnosis Date   Abnormal Pap smear 2011   hpv/mild dysplasia,cin1   Anxiety    Cerebral palsy (HCC)    right arm/leg   Cystocele    Depression    Headache    Neuromuscular disorder (HCC)    Cerebral Palsy   OCD (obsessive compulsive disorder)    Osteoporosis    Uterine prolaps     Family History  Problem Relation Age of Onset   Cancer Father        skin AND LUNG   Alcohol abuse Sister        CRACK COCAINE    Social History   Socioeconomic History   Marital status: Married    Spouse name: Not on file   Number of children: Not on file   Years of education: Not on file   Highest education  level: Not on file  Occupational History   Not on file  Tobacco Use   Smoking status: Never   Smokeless tobacco: Never  Substance and Sexual Activity   Alcohol use: Not Currently    Comment: OCCASIONAL beer   Drug use: No   Sexual activity: Yes    Birth control/protection: Pill    Comment: LOESTRIN 24 FE  Other Topics Concern   Not on file  Social History Narrative   Not on file   Social Determinants of Health   Financial Resource Strain: Not  on file  Food Insecurity: Not on file  Transportation Needs: Not on file  Physical Activity: Not on file  Stress: Not on file  Social Connections: Not on file  Intimate Partner Violence: Not on file    Past Medical History, Surgical history, Social history, and Family history were reviewed and updated as appropriate.   Please see review of systems for further details on the patient's review from today.   Objective:   Physical Exam:  LMP  (LMP Unknown)   Physical Exam Neurological:     Mental Status: She is alert and oriented to person, place, and time.     Cranial Nerves: No dysarthria.     Motor: Weakness present.     Gait: Gait abnormal.  Psychiatric:        Attention and Perception: Attention and perception normal.        Mood and Affect: Mood is anxious and depressed. Affect is not labile or tearful.        Speech: Speech normal. Speech is not slurred.        Behavior: Behavior normal. Behavior is not slowed. Behavior is cooperative.        Thought Content: Thought content normal. Thought content is not delusional. Thought content does not include homicidal or suicidal ideation. Thought content does not include suicidal plan.        Cognition and Memory: Cognition and memory normal. Cognition is not impaired.        Judgment: Judgment normal.     Comments: Insight intact Pleasant. Ongoing OCD remains fairly severe but less anxious Checking compulsions  2 hours daily but improving noticeably Chronic depression persistent but better with Spravato      Lab Review:     Component Value Date/Time   NA 138 06/11/2021 0606   K 4.0 06/11/2021 0606   CL 107 06/11/2021 0606   CO2 26 06/11/2021 0606   GLUCOSE 90 06/11/2021 0606   BUN 18 06/11/2021 0606   CREATININE 0.81 06/11/2021 0606   CALCIUM 9.4 06/11/2021 0606   PROT 6.5 06/11/2021 0606   ALBUMIN 3.3 (L) 06/11/2021 0606   AST 17 06/11/2021 0606   ALT 14 06/11/2021 0606   ALKPHOS 141 (H) 06/11/2021 0606   BILITOT  0.2 (L) 06/11/2021 0606   GFRNONAA >60 06/11/2021 0606   GFRAA >60 07/09/2016 0438       Component Value Date/Time   WBC 5.8 06/11/2021 0606   RBC 4.12 06/11/2021 0606   HGB 12.5 06/11/2021 0606   HCT 39.7 06/11/2021 0606   PLT 299 06/11/2021 0606   MCV 96.4 06/11/2021 0606   MCH 30.3 06/11/2021 0606   MCHC 31.5 06/11/2021 0606   RDW 13.9 06/11/2021 0606   LYMPHSABS 1.9 06/11/2021 0606   MONOABS 0.5 06/11/2021 0606   EOSABS 0.1 06/11/2021 0606   BASOSABS 0.0 06/11/2021 0606    No results found for: "POCLITH", "LITHIUM"   No results found  for: "PHENYTOIN", "PHENOBARB", "VALPROATE", "CBMZ"   .res Assessment: Plan:    Selby was seen today for follow-up, depression, anxiety and fatigue.  Diagnoses and all orders for this visit:  Recurrent major depression resistant to treatment [facility])  Mixed obsessional thoughts and acts  Social anxiety disorder  Insomnia due to mental condition    Both Dx are TR and marked.  Impaired function but less so with Spravato re: depression..   She is receiving Spravato 84 mg weekly and marked improvement in the depression..  she feels it also helps OCD somewhat.  However still easily overwhelmed with low stress tolerance.  The OCD is improving some with the increase in fluvoxamine and with Spravato.  Spends 2 hours daily and checking compulsions.  She has been on higher doses of fluvoxamine above the usual max of 400 mg daily in the past.  This became difficult to obtain at 1 point and the dose was reduced to 300 mg daily.   Disc SE. She would like to continue Luvox 400 mg daily again    She is tolerating the meds well  Continue Increased Luvox back to 400 mg nightly as of January 2023. Disc dosing higher than usual.  She feels this is increase has helped more with OCD which remains chronically severe.   increased Auvelity BID and reduced Wellbutrin to 150 AM on 04/13/22 We discussed potential side effects and drug interaction  issues.  Disc Spravato DT TRD incl details and SE. Disc dosing and duration.  Pt with severe depression MADRS 36 on 06/29/21 Patient was administered Spravato 84 mg intranasally dosage today.  The patient experienced the typical dissociation which gradually resolved over the 2-hour period of observation.  There were no complications.  Specifically the patient did not have nausea or vomiting or headache.  Blood pressures remained within normal ranges at the 40-minute and 2-hour follow-up intervals.  By the time the 2-hour observation period was met the patient was alert and oriented and able to exit without assistance. She tends to have lingering sedative effects but not severe. .  See nursing note for further details. Per protocol will continue Spravato to 84 mg next session.  She has been okay since cutting back to once weekly.  We discussed the short-term risks associated with benzodiazepines including sedation and increased fall risk among others.  Discussed long-term side effect risk including dependence, potential withdrawal symptoms, and the potential eventual dose-related risk of dementia.  But recent studies from 2020 dispute this association between benzodiazepines and dementia risk. Newer studies in 2020 do not support an association with dementia. Disc this is high dose and not ideal.  Also disc risk combining it with temazepam. Rec gradually reduce HS lorazepam to 1 mg Hs.  Can continue lorazepam 2 mg AM and 1 mg in afternoon bc of chronic anxiety and it is helpful and tolerated. She can continue temazepam 30 mg nightly.  She tends to have a lot of anxious negative thoughts at night when she is trying to go to bed  Consider olanzapine for TR anxiety and TRD but sig risk weight gain.  Supportive therapy dealing with some of the recent stressors including family stressors and her own health related to cerebral palsy   Follow-up weekly .    Lynder Parents, MD, DFAPA  Please see After Visit  Summary for patient specific instructions.  Future Appointments  Date Time Provider [place]  05/09/2022 11:00 AM Blanchie Serve, PhD [facility] None  06/13/2022 11:00 AM [place],  Herbie Baltimore, PhD [facility] None    No orders of the defined types were placed in this encounter.    -------------------------------

## 2022-04-21 NOTE — Progress Notes (Signed)
NURSE Visit:   Pt arrived for her weekly Spravato Treatment, she started Spravato treatments on 10/07/2021, she continues with 84 mg (3 of the 28 mg) Spravato nasal spray once a week treatments, which is the maintenance dose for her treatment resistant depression.   She was directed to the treatment room to get vitals taken first. Pt's mood today was much better and seemed her normal self.  Her B/P at 3:15 PM 119/75, 96, Pt instructed to blow her nose and to recline back at 45 degrees. Pt given first nasal spray (28 mg) administered by pt observed by nurse. Pt always has to go to the restroom after her first dose, at that point she is not too medicated to work to bathroom safely.There were 5 minutes between each dose, total of 84 mg. Tolerated well. Pt's medication is delivered by [facility] in [place] and stored inside a safe behind a locked door as well. Spravato is a CIII medication and has to be only given at a treatment facility and observed by nurse as pt administered intranasally.  Pt's 40 minute vital signs at 3:55 PM 115/72, 90. Dr. Clovis Pu met with pt to discuss her care at the end of her treatment when her thoughts are clearer and they discussed her medication and moods. She does go to the bathroom at least once during her treatment and again towards the end when she is clearer and able to walk. No sedation and had slight feeling of being "high" she reports. She did complain of a headache and received Tylenol 2 tablets of 500 mg. She reports maybe she didn't eat enough today. She is currently eating in the recliner a granola bar and some candy.  Discharge vitals at 5:15 PM 137/66, 93. Pt stable for discharge. She won't be coming for a treatment because her and her husband will be flying out of town for a vacation. She plans on scheduling the week of July 17th.  Pt was observed on site a total of 120 minutes per FDA/REMS requirements. Pt was with nurse for clinical assessment a total of 55  minutes.    LOT [id number] EXP 2025 APR

## 2022-04-29 NOTE — Telephone Encounter (Signed)
Received refill request from [facility] for Spravato 84 mg, but due to pt being on vacation and missing a couple of times no refills needed at this time. Will notify pharmacy.

## 2022-05-05 ENCOUNTER — Ambulatory Visit (INDEPENDENT_AMBULATORY_CARE_PROVIDER_SITE_OTHER): Payer: 59 | Admitting: Psychiatry

## 2022-05-05 ENCOUNTER — Ambulatory Visit: Payer: 59

## 2022-05-05 VITALS — BP 119/77 | HR 84

## 2022-05-05 DIAGNOSIS — F422 Mixed obsessional thoughts and acts: Secondary | ICD-10-CM

## 2022-05-05 DIAGNOSIS — F5105 Insomnia due to other mental disorder: Secondary | ICD-10-CM

## 2022-05-05 DIAGNOSIS — F339 Major depressive disorder, recurrent, unspecified: Secondary | ICD-10-CM

## 2022-05-05 DIAGNOSIS — G809 Cerebral palsy, unspecified: Secondary | ICD-10-CM

## 2022-05-05 DIAGNOSIS — F4010 Social phobia, unspecified: Secondary | ICD-10-CM | POA: Diagnosis not present

## 2022-05-06 ENCOUNTER — Encounter: Payer: Self-pay | Admitting: Psychiatry

## 2022-05-06 NOTE — Progress Notes (Signed)
Casey Diaz [id number] July 10, 1966 56 y.o.    Subjective:   Patient ID:  Casey Diaz is a 56 y.o. (DOB January 21, 1966) female.  Chief Complaint:  Chief Complaint  Patient presents with   Follow-up   Depression   Anxiety     HPI Casey Diaz presents to the office today for follow-up of OCD and severe anxiety.     December 2019 visit the following was noted: No meds were changed. Lives in [place] and back for followup.  Sx are about the same.  Has to take meds with different sizes. Pt reports that mood is Anxious and Depressed and describes anxiety as Severe. Anxiety symptoms include: Excessive Worry, Obsessive Compulsive Symptoms:   Checking,,. Pt reports has interrupted sleep and nocturia. Pt reports that appetite is good. Pt reports that energy is no change and down slightly. Concentration is down slightly. Suicidal thoughts:  denied by patient. Loves the environment of [place] but misses some things there.  She's not able to work there.  H works there and likes it.  Struggled with not working, feels isolated and not up to task of meeting people.  Does attend a church and met a friend who's been helpful.  Leaving for [place] on 10/16/18.   04/09/2020 appointment the following is noted:  Staying another year in [place] bc Covid and other things. Last few months a lot of crying spells.  Is in menopause. Wonders about med changes though is nervous about it.  Crying spells associated with depressing thoughts more than stress or OCD.   Covid really hard on everyone and couldn't see family for 18 mos.  Family still very dysfunctional. No close friends in part due to OCD and depression. Son high Autism spectrum with ADHD and anxiety and she's with him all the time. Greater health problems with CP so more pains.   05/15/20 appt with the following noted: [name] for menopause and helps some. Still depressed.  Chronically. In [place] for 2 more weeks then to [place] for another  year. A lot of stressors lately triggering more checking and anxiety.   OCD is her CC now and seems.  Got worse DT stress.   Stressed with Asberger's son and her health.  H works a lot.  Her FOO still stress. Plan: Trintellix 10 mg 1 tablet in the morning with food and reduce fluvoxamine to 5 tablets nightly for 1 week  then reduce it to 4 tablets nightly.   07/02/20 appt with the following noted: Decided not to get Trintellix bc difficulty getting it. It is available.  There.  Wants to start it now.   Both depression and OCD are severe.  Not suicidal in intent or plan. Did not take samples with her to [place] but will be back in December. covid is worse there and travel is difficult.  Wants to reduce Wellbutrin DT dry mouth. Plan: She's afraid to reduce Luvox at this time DT fear of worsening OCD.  But will consider. Trintellix 10 mg 1 tablet in the morning with food and reduce fluvoxamine to 5 tablets nightly for 1 week  then reduce it to 4 tablets nightly. Also reduce Wellbutrin XL to 300 mg daily.    9-13 2022 appointment with the following noted: Back in [place] since July 14.  Broke arm a month ago and surgery.  It's all been rough adjustment.   B has cancer on his face and M fell taking him to the doctor.  Misses the water  and weather of [place].   Cry a lot more since menopause. Still depression and anxiety and OCD.  Asks about ketamine. On Wellbutrin 300, Luvox 300.  No Trintellix. Added Ativan 2 mg AM and HS and it helps.  More likely to get upset at night. Plan: Increase Luvox back to 400 mg daily.  She thinks she's worse on less. Continue Wellbutrin XL to 300 mg daily. 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  She tolerated it well without unusual nausea or vomiting headache or other somatic symptoms.  She did have the expected dissociation which gradually resolved over the course  of the 2-hour period of observation.  She was a little concerned about her balance given her cerebral palsy but has not noted unusual or unexpected problems.  She is motivated to can continue Spravato in hopes of reducing her depressive symptoms. 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  She also has treatment resistant OCD which is partially managed with medications but is still quite disabling.  She is tolerating the medications well.  She is sleeping adequately.  Her appetite is adequate.  She is not having suicidal thoughts.  She continues to wish for a better treatment for OCD that would give her some relief.  09/30/2021 appointment with the following noted: She received her first dose of Spravato 84 mg intranasally today.  She tolerated it well without unusual nausea, vomiting, or other somatic symptoms.  Dissociation as expected did occur and gradually resolved over the 2-hour period of observation.  She did have a mild headache today with the treatment and received ibuprofen 600 mg at her request.  We will follow this to see if it is a pattern Patient is still depressed.  She said she was late with her medicine today and today was a particularly depressing day.  However she notes that the Spravato has lifted her mood considerably even today.  She is hopeful that it will continue to be helpful.  No suicidal thoughts.  She has ongoing chronic anxiety and OCD at baseline.  10/04/21 appt noted: Patient received Spravato 84 mg for the second time today.  She tolerated it well without any unusual headache, nausea or vomiting or other somatic symptoms.  Dissociation did occur and she gradually [employer] resolution over the 2-hour period of observation. She did not have any unusual problems after she left the office last Spravato administration.  She did not have any specific problems with balance or walking.  She is at increased risk of that difficulty because of cerebral  palsy.  So far she has not noticed much mood effect from the medication beyond the first day of receiving it.  However she would like to continue Spravato in hopes of getting the antidepressant effect that is desired. Stress dealing with mother's behavior at party pt hosted.  Guilt over it.  10/07/2021 appointment noted: Patient received Spravato 84 mg for the second time today.  She tolerated it well without any unusual headache, nausea or vomiting or other somatic symptoms.  Dissociation did occur and she gradually Kelley resolution over the 2-hour period of observation. She still is not sure about the antidepressant effect of Spravato.  Events over the holidays and demands, make it difficult to assess.  She still notes that the OCD tends to worsen the depression and vice versa.  She tolerates the Spravato well and  wants to continue the trial.  10/15/2021 appointment with the following noted: Patient received Spravato 84 mg for the second time today.  She tolerated it well without any unusual headache, nausea or vomiting or other somatic symptoms.  Dissociation did occur and she gradually [place] resolution over the 2-hour period of observation. Patient says it was somewhat difficult to evaluate the effect of the Spravato.  It was scheduled to be twice weekly for 4 weeks consecutively but the holidays have interfered with that administration.  She asked what specifically should be she should be looking for in order to assess improvement.  That was discussed.  The OCD is unchanged and the depression so far is not significantly different.  She still tolerates meds.  There have been no recent med changes  10/19/2021 appt noted: Patient received Spravato 84 mg for the second today.  She tolerated it well without any unusual headache, nausea or vomiting or other somatic symptoms.  Dissociation did occur and she gradually saw resolution over the 2-hour period of observation.   10/21/2021 appointment noted: Patient  received Spravato 84 mg today.  She tolerated it well without any unusual headache, nausea or vomiting or other somatic symptoms.  Dissociation did occur and she gradually saw resolution over the 2-hour period of observation.  She feels better than last week.  She is not as depressed and down.  She is still dealing with grief around the death of her cousin that was unexpected.  It is still difficult to tell how much the Spravato was doing but she is hopeful.  Anxiety is still present with the OCD.  She is not having suicidal thoughts.  She is not hopeless.  She wants to continue treatment.  10/25/2021 appointment with the following noted: Patient received Spravato 84 mg today.  She tolerated it well without any unusual headache, nausea or vomiting or other somatic symptoms.  Dissociation did occur and she gradually saw resolution over the 2-hour period of observation.  She does not typically find the dissociation very strong. She is beginning to think the Spravato is helping somewhat with the depression.  It has been difficult to tell with the holidays intervening as well as the death of her cousin.  She has not been able to get Spravato twice weekly for 4 weeks straight as typically planned.  However she is hopeful.  The OCD remains significant.  She still has a tendency to think very negatively.  She is not suicidal.  10/28/2021 appointment with the following noted: Patient received Spravato 84 mg today.  She tolerated it well without any unusual headache, nausea or vomiting or other somatic symptoms.  Dissociation did occur and she gradually saw resolution over the 2-hour period of observation.  She does not typically find the dissociation very strong. She is feeling more hopeful about the administration of Spravato.  She is having less depression she believes.  Still not dramatically different.  She still has a tendency to have a lot of anxiety and rumination and OCD.  She is not suicidal.  She is eager to  continue the Spravato.  11/01/2021 appointment with the following noted: Patient received Spravato 84 mg today.  She tolerated it well without any unusual headache, nausea or vomiting or other somatic symptoms.  Dissociation did occur and she gradually saw resolution over the 2-hour period of observation.  She does not typically find the dissociation very strong. She is continuing to see a little bit of improvement in depression with Spravato.  The anxiety remains but may be not as severe.  The OCD remains markedly severe chronically.  She is not suicidal.  She is encouraged by the degree of improvement with Spravato and inability to enjoy things more and not be quite as ruminative.  11/04/2021 appt noted: Patient received Spravato 84 mg today.  She tolerated it well without any unusual headache, nausea or vomiting or other somatic symptoms.  Dissociation did occur and she gradually saw resolution over the 2-hour period of observation.  She does not typically find the dissociation very strong. No SE complaints with meds. She continues to feel hopeful about the Spravato.  She has less depression.  Because of a number of factors she is uncertain of the full benefit but thinks she is somewhat less depressed.  Her anxiety and OCD remain significant but a little better.  She is tolerating the medications and does not desire medicine change.  She is not currently complaining of insomnia.   11/08/2021 appointment the following noted: Patient received Spravato 84 mg today.  She tolerated it well without any unusual headache, nausea or vomiting or other somatic symptoms.  Dissociation did occur and she gradually saw resolution over the 2-hour period of observation.  She does not typically find the dissociation very strong. No SE complaints with meds. She feels the Spravato is helping somewhat.  She would like to see a greater effect.  However she is able to enjoy things.  She is productive at home.  She would like  to see a lifting of a degree of sadness that remains.  The anxiety and OCD remained largely unchanged.  She wondered about the dosing of Wellbutrin 300 mg a day and Luvox 300 mg a day and possible increases.  She has been at higher doses in the past.  She plans to start water therapy for her weakness and for her shoulder.  11/11/2021 appointment with the following noted: Patient received Spravato 84 mg today.  She tolerated it well without any unusual headache, nausea or vomiting or other somatic symptoms.  Dissociation did occur and she gradually saw resolution over the 2-hour period of observation.  She does not typically find the dissociation very strong. No SE complaints with meds. She feels the Spravato is clearly helping the depression.  She would like to see a more significant effect.  She is still having trouble thinking positive. Her energy is fair.  Concentration is good except for the problem with chronic obsessions. She has been taking Wellbutrin 300 mg in Luvox 300 mg for quite some time but has taken higher doses in the past.  We discussed that.  She would like to try higher doses in order to get a better effect if possible. We just increased the doses a couple of days ago.  No effect yet.  11/15/2021 appointment with the following noted: Patient received Spravato 84 mg today.  She tolerated it well without any unusual headache, nausea or vomiting or other somatic symptoms.  Dissociation did occur and she gradually saw resolution over the 2-hour period of observation.  She does not typically find the dissociation very strong. No SE complaints with meds. The patient is now convinced that the Spravato is helping the depression.  She would like to continue twice weekly Spravato this week if possible.  She has tolerated the increase in Wellbutrin to 450 mg daily and the increase and fluvoxamine to 400 mg daily without complications thus far.  The OCD and anxiety feed the depression to  some  extent. She spends approximately 2 hours daily with checking compulsions due to obsessions about causing harm to others.  For example fearing that when she has hit a pot hole that she may have hit a person and going back to check.  Checking corners and rooms out of fear that she may have harmed someone.  Other various checking compulsions.  She is hoping the increase in fluvoxamine to 400 mg will reduce that over the weeks to come.  She is not seeing a significant difference with the addition of the Spravato though she understands that was not expected.  She is more productive at home and more motivated and able to enjoy things more fully as a result of the Spravato treatment.  She is tolerating the medication  11/18/2021 appointment with the following noted: Patient received Spravato 84 mg today.  She tolerated it well without any unusual headache, nausea or vomiting or other somatic symptoms.  Dissociation did occur and she gradually saw resolution over the 2-hour period of observation.  She does not typically find the dissociation very strong. No SE complaints with meds. She clearly believes the Spravato has been helpful for the depression.  She wonders whether to continue to treatments weekly or to cut back to 1 weekly.  She would like to continue twice weekly in hopes of getting additional improvement in the depression because it is not resolved but it is difficult to get here twice a week in terms of arranging rides. She is recently increased Wellbutrin XL to 450 mg daily and fluvoxamine to 400 mg daily but they have not had time to have an official effect.  She is tolerating that well.  She is tolerating meds overwork overall well. The OCD remains the same as noted on 11/15/2021  11/25/21 appt noted: Patient received Spravato 84 mg today.  She tolerated it well without any unusual headache, nausea or vomiting or other somatic symptoms.  Dissociation did occur and she gradually saw resolution over the  2-hour period of observation.  She does not typically find the dissociation very strong. No SE complaints with meds. She thinks the increase in Wellbutrin and Luvox have been potentially helpful for depression and OCD respectively.  It has been too early to see the full effect.  She is sleeping and eating well.  She is functioning at home.  She still spends a lot of time that is about 2 hours a day dealing with compulsive behaviors.  12/02/21 appt noted: Patient received Spravato 84 mg today.  She tolerated it well without any unusual headache, nausea or vomiting or other somatic symptoms.  Dissociation did occur and she gradually saw resolution over the 2-hour period of observation.  She does not typically find the dissociation very strong. No SE complaints with meds. Several losses and stressors recently that affect her sense of mood. However still sees significant benefit from the Spravato for her depression.  Wants to continue it. Suspect early  some benefit from the increased Wellbutrin for depression and Luvox for OCD. Tolerating meds. No complaints about the meds. Sleeping and eating well.  No new health concerns.  12/09/21 appt noted: Patient received Spravato 84 mg today.  She tolerated it well without any unusual headache, nausea or vomiting or other somatic symptoms.  Dissociation did occur and she gradually saw resolution over the 2-hour period of observation.  She does not typically find the dissociation very strong. No SE complaints with meds. Seeing noticeable improvement from increase fluvoxamine to  400 mg daily.  Tolerating meds without concerns over them. Depression is stable with residual sx of easy guilt and easily stressed.  OCD contributes to depression but depression is not severe with less crying spells.  Productive at home with chores.  Enjoyed recent birthday.  Sleeping good. No new concerns.  12/23/2021 appointment noted: Patient received Spravato 84 mg today.  She  tolerated it well without any unusual headache, nausea or vomiting or other somatic symptoms.  Dissociation did occur and she gradually saw resolution over the 2-hour period of observation.  She does not typically find the dissociation very strong. No SE complaints with meds. Seeing noticeable improvement from increase fluvoxamine to 400 mg daily.  Tolerating meds without concerns over them. Her depression is somewhat improved with the Spravato.  She also feels generally a little lighter.  She is more motivated.  She is less overwhelmed by guilt.  The OCD is gradually improving but is still quite time-consuming as noted before.  She is sleeping well.  No side effects  12/30/2021 appointment with the following noted: Patient received Spravato 84 mg today.  She tolerated it well without any unusual headache, nausea or vomiting or other somatic symptoms.  Dissociation did occur and she gradually saw resolution over the 2-hour period of observation.  She does not typically find the dissociation very strong. No SE complaints with meds. Seeing noticeable improvement from increase fluvoxamine to 400 mg daily.  Tolerating meds without concerns over them. She is confident of her the improvement seen with Spravato.  She is less hopeless.  Guilt is marked remarkably improved.  She is not having any thoughts of death or dying.  She is more motivated for activities such as exercise which she is recently started.  She is sleeping well. The OCD remains severe but it is improving somewhat with the increase in fluvoxamine.  It is still consuming a couple hours per day.  01/10/22 apravato 84 admin  01/24/22 appt noted: Patient received Spravato 84 mg today.  She tolerated it well without any unusual headache, nausea or vomiting or other somatic symptoms.  Dissociation did occur and she gradually saw resolution over the 2-hour period of observation.  She does not typically find the dissociation very strong. No SE  complaints with meds. Very tearful today.  Feels like she has been suppressing emotion in the Spravato caused it to be released.  Discussed some stressors.  Overall still feels the medicine is helpful.  She has missed some of the scheduled Spravato treatments that were intended to be weekly due to circumstances beyond her control.  She is still struggling with OCD as previously noted but does believe the medications are helpful. Plan no med changes  01/31/2022 received Spravato 84 mg today  02/09/2022 appointment with the following noted: Patient received Spravato 84 mg today.  She tolerated it well without any unusual headache, nausea or vomiting or other somatic symptoms.  Dissociation did occur and she gradually saw resolution over the 2-hour period of observation.  She does not typically find the dissociation very strong. No SE complaints with meds. Spravato clearly helps depression and OCD but easily gets overwhelmed and tearful with fairly routine stressors.  Tolerating meds. Sleep and appetite is OK Asks to increase lorazepam to 2 mg AM and HS and 46m afternoon  02/16/22 appt noted: Patient received Spravato 84 mg today.  She tolerated it well without any unusual headache, nausea or vomiting or other somatic symptoms.  Dissociation did occur and she  gradually saw resolution over the 2-hour period of observation.  She does not typically find the dissociation very strong. No SE complaints with meds. She has chronic depesssion and OCD but is improved with Spravato, both dx versus before.  She has continued Luvox 400 mg and Wellbutrin 450 mg and is tolerating it.  Chronically easily stressed.  Tolerating all meds.  Doesn't like taking more meds.  Spending a couple hours daily with OCD.  No SI No med changes.  02/21/22 appt noted:   Doesn't like taking more meds.  Spending a couple hours daily with OCD.  No SI No med changes.  02/21/22 appt noted: Patient received Spravato 84 mg today.  She  tolerated it well without any unusual headache, nausea or vomiting or other somatic symptoms.  Dissociation did occur and she gradually saw resolution over the 2-hour period of observation.  She does not typically find the dissociation very strong. No SE complaints with meds. She has chronic depesssion and OCD but is improved with Spravato, both dx versus before.  She has continued Luvox 400 mg and Wellbutrin 450 mg and is tolerating it.  Chronically easily overwhelmed and doesn't know why.  Tolerating all meds. Wants to continue meds.  03/16/22 appt noted: Patient received Spravato 84 mg today.  She tolerated it well without any unusual headache, nausea or vomiting or other somatic symptoms.  Dissociation did occur and she gradually saw resolution over the 2-hour period of observation.  She does not typically find the dissociation very strong. No SE complaints with meds. Overall she still feels the Spravato has been helpful not only for her depression but also for her OCD which was somewhat unexpected.  OCD is still significant but it is less severe than prior to starting Spravato.  She is tolerating Luvox 400 mg and Wellbutrin 450 mg.  We discussed possible med adjustments.  03/23/22 appt noted: Patient received Spravato 84 mg today.  She tolerated it well without any unusual headache, nausea or vomiting or other somatic symptoms.  Dissociation did occur and she gradually saw resolution over the 2-hour period of observation.  She does not typically find the dissociation very strong. No SE complaints with meds. She is still depressed and still has OCD of course but is improved with the Spravato.  She is tolerating the medications well.  We had previously discussed the possibility of switching some of the Wellbutrin to [facility] and she is very interested in that in hopes of further improvement in depression and OCD.  She understands that Auvelity is not used for OCD on the label.  She is tolerating the  medications.  She is still easily overwhelmed.  She is sleeping and eating okay.. Plan: Reduce Wellbutrin XL to 300 mg AM and add Auvelity 1 tablet each AM  03/30/22 appt noted: Patient received Spravato 84 mg today.  She tolerated it well without any unusual headache, nausea or vomiting or other somatic symptoms.  Dissociation did occur and she gradually saw resolution over the 2-hour period of observation.  She does not typically find the dissociation very strong. No SE complaints with meds. She is still depressed and still has OCD of course but is improved with the Spravato.  She is tolerating the medications well.  No difference with Auvelity 1 AM so far and no SE.  Going on vacation on Saturday. 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 Sleep and appetite good. Plan: Increase Auvelity to 1 twice daily and reduce Wellbutrin to XL 150  every morning  04/14/2022 appointment with the following noted: Patient received Spravato 84 mg today.  She tolerated it well without any unusual headache, nausea or vomiting or other somatic symptoms.  Dissociation did occur and she gradually saw resolution over the 2-hour period of observation.  She does not typically find the dissociation very strong. No SE complaints with meds. She is still depressed and still has OCD of course but is improved with the Spravato.  She is tolerating the medications well.  Just increased Auvelity to BID yesterday and reduced Wellbutrin to 150 AM. No SE so far.  No change in mood or anxiety so far.  Chronic OCD as noted and residual depression and chronic fatigue.  04/21/2022 appointment with the following noted: Patient received Spravato 84 mg today.  She tolerated it well without any unusual headache, nausea or vomiting or other somatic symptoms.  Dissociation did occur and she gradually saw resolution over the 2-hour period of observation.  She does not typically find the dissociation very strong. No SE complaints with  meds. She is still depressed and still has OCD of course but is improved with the Spravato.   She has questions about the dosing of lorazepam. She tends to have negative anxious thoughts at night.  This tends to interfere with her ability to go to sleep.  She is getting about 8 to 9 hours of sleep.  She is tolerating the meds without excessive sedation and does not nap during the day.  05/06/22 appt noted: Patient received Spravato 84 mg today.  She tolerated it well without any unusual headache, nausea or vomiting or other somatic symptoms.  Dissociation did occur and she gradually saw resolution over the 2-hour period of observation.  She does not typically find the dissociation very strong. No SE complaints with meds. She is still depressed and still has OCD of course but is improved with the Spravato.   Had some questions about timing of dosing of fluvoxamine and Auvelity. OCD is not quite as time consuming.  Sleep and eating are the same.   No SE meds.  Previous psych med trials include Prozac, paroxetine, sertraline, fluvoxamine, venlafaxine, Anafranil with no response,  Wellbutrin, , Viibryd, Trintellix 10 1 month NR Geodon,  risperidone, Rexulti, Abilify,  Seroquel, Latuda 40 mg with irritability.  lamotrigine lithium,  BuSpar, Namenda,  pramipexole with no response, and Topamax, pindolol  ECT-MADRS    [place] Office Visit from 06/29/2021 in [place] Total Score 36      Flowsheet Row Admission (Discharged) from 06/11/2021 in [facility] No Risk        Review of Systems:  Review of Systems  Constitutional:  Positive for fatigue.  Cardiovascular:  Negative for palpitations.  Musculoskeletal:  Positive for arthralgias, back pain and gait problem. Negative for joint swelling.  Neurological:  Positive for weakness. Negative for tremors.  Psychiatric/Behavioral:  Positive for dysphoric mood. Negative for  agitation and suicidal ideas. The patient is nervous/anxious.     Medications: I have reviewed the patient's current medications.  Current Outpatient Medications  Medication Sig Dispense Refill   Abaloparatide (TYMLOS) 3120 MCG/1.56ML SOPN Inject into the skin.     Azelastine-Fluticasone 137-50 MCG/ACT SUSP Place 1-2 sprays into both nostrils daily.     baclofen (LIORESAL) 10 MG tablet Take 20 mg by mouth at bedtime as needed for muscle spasms.     buPROPion (WELLBUTRIN XL) 150 MG 24 hr tablet TAKE  3 TABLETS BY MOUTH DAILY. (Patient taking differently: Take 150 mg by mouth daily.) 270 tablet 0   Dextromethorphan-buPROPion ER (AUVELITY) 45-105 MG TBCR Take 1 tablet by mouth 2 (two) times daily.     dicyclomine (BENTYL) 10 MG capsule Take 10 mg by mouth daily.     docusate sodium (COLACE) 100 MG capsule Take 1 capsule (100 mg total) by mouth 2 (two) times daily. (Patient taking differently: Take 100 mg by mouth daily.) 10 capsule 0   Esketamine HCl, 84 MG Dose, (SPRAVATO, 84 MG DOSE,) 28 MG/DEVICE SOPK USE 3 SPRAYS IN EACH NOSTRIL ONCE A WEEK 3 each 3   fexofenadine (ALLEGRA) 180 MG tablet Take 180 mg by mouth daily.     fluvoxaMINE (LUVOX) 100 MG tablet Take 3 tablets (300 mg total) by mouth at bedtime. 270 tablet 0   fluvoxaMINE (LUVOX) 100 MG tablet Take 1 tablet (100 mg total) by mouth at bedtime. 90 tablet 0   fluvoxaMINE (LUVOX) 100 MG tablet Take one tablet (100 mg) by mouth every morning and 3 tablets (300 mg) at bedtime. 120 tablet 5   hydrocortisone (ANUSOL-HC) 2.5 % rectal cream Place rectally 2 (two) times daily. x 7-14 days 30 g 0   ketotifen (ZADITOR) 0.025 % ophthalmic solution Place 3 drops into both eyes at bedtime.     LORazepam (ATIVAN) 1 MG tablet TAKE 1-2 IN THE AM AND 1-2 TABLETS EVERY NIGHT AT BEDTIME AND 1 TABLET IN AFTERNOON when needed for anxiety and sleep 150 tablet 3   magnesium gluconate (MAGONATE) 500 MG tablet Take 500 mg by mouth daily.     MIBELAS 24 FE 1-20  MG-MCG(24) CHEW Chew 1 tablet by mouth at bedtime as needed (bowel regularity).     Multiple Vitamins-Minerals (ADULT GUMMY PO) Take 2 tablets by mouth in the morning.     nitrofurantoin (MACRODANTIN) 100 MG capsule Take 100 mg by mouth as needed (For urinary tract infection.).      oxyCODONE-acetaminophen (PERCOCET/ROXICET) 5-325 MG tablet Take 1-2 tablets by mouth every 6 (six) hours as needed for severe pain. 50 tablet 0   polyethylene glycol (MIRALAX / GLYCOLAX) packet Take 17 g by mouth daily as needed for mild constipation. 14 each 0   psyllium (METAMUCIL) 58.6 % powder Take 1 packet by mouth daily as needed (constipation).     temazepam (RESTORIL) 30 MG capsule TAKE 1 CAPSULE BY MOUTH AT BEDTIME AS NEEDED FOR SLEEP. 30 capsule 0   Vitamin D-Vitamin K (VITAMIN K2-VITAMIN D3 PO) Take 1-2 sprays by mouth daily.     No current facility-administered medications for this visit.    Medication Side Effects: None   Allergies:  Allergies  Allergen Reactions   Hydrocodone Itching   Sulfamethoxazole-Trimethoprim Itching   Dust Mite Extract Other (See Comments)    Sneezing, watery eyes, runny nose   Latex Itching   Other Other (See Comments)    PT IS ALLERGIC TO CAT DANDER AND RAGWEED - Sneezing, watery eyes, runny nose    Pollen Extract Other (See Comments)    Sneezing, watery eyes, runny nose     Past Medical History:  Diagnosis Date   Abnormal Pap smear 2011   hpv/mild dysplasia,cin1   Anxiety    Cerebral palsy (HCC)    right arm/leg   Cystocele    Depression    Headache    Neuromuscular disorder (HCC)    Cerebral Palsy   OCD (obsessive compulsive disorder)    Osteoporosis    Uterine  prolaps     Family History  Problem Relation Age of Onset   Cancer Father        skin AND LUNG   Alcohol abuse Sister        CRACK COCAINE    Social History   Socioeconomic History   Marital status: Married    Spouse name: Not on file   Number of children: Not on file   Years of  education: Not on file   Highest education level: Not on file  Occupational History   Not on file  Tobacco Use   Smoking status: Never   Smokeless tobacco: Never  Substance and Sexual Activity   Alcohol use: Not Currently    Comment: OCCASIONAL beer   Drug use: No   Sexual activity: Yes    Birth control/protection: Pill    Comment: LOESTRIN 24 FE  Other Topics Concern   Not on file  Social History Narrative   Not on file   Social Determinants of Health   Financial Resource Strain: Not on file  Food Insecurity: Not on file  Transportation Needs: Not on file  Physical Activity: Not on file  Stress: Not on file  Social Connections: Not on file  Intimate Partner Violence: Not on file    Past Medical History, Surgical history, Social history, and Family history were reviewed and updated as appropriate.   Please see review of systems for further details on the patient's review from today.   Objective:   Physical Exam:  LMP  (LMP Unknown)   Physical Exam Neurological:     Mental Status: She is alert and oriented to person, place, and time.     Cranial Nerves: No dysarthria.     Motor: Weakness present.     Gait: Gait abnormal.  Psychiatric:        Attention and Perception: Attention and perception normal.        Mood and Affect: Mood is anxious and depressed. Affect is not labile or tearful.        Speech: Speech normal. Speech is not slurred.        Behavior: Behavior normal. Behavior is cooperative.        Thought Content: Thought content normal. Thought content is not delusional. Thought content does not include homicidal or suicidal ideation. Thought content does not include suicidal plan.        Cognition and Memory: Cognition and memory normal. Cognition is not impaired.        Judgment: Judgment normal.     Comments: Insight intact Pleasant. Ongoing OCD remains fairly severe but less anxious Checking compulsions  2 hours daily but improving noticeably Chronic  depression persistent but better with Spravato      Lab Review:     Component Value Date/Time   NA 138 06/11/2021 0606   K 4.0 06/11/2021 0606   CL 107 06/11/2021 0606   CO2 26 06/11/2021 0606   GLUCOSE 90 06/11/2021 0606   BUN 18 06/11/2021 0606   CREATININE 0.81 06/11/2021 0606   CALCIUM 9.4 06/11/2021 0606   PROT 6.5 06/11/2021 0606   ALBUMIN 3.3 (L) 06/11/2021 0606   AST 17 06/11/2021 0606   ALT 14 06/11/2021 0606   ALKPHOS 141 (H) 06/11/2021 0606   BILITOT 0.2 (L) 06/11/2021 0606   GFRNONAA >60 06/11/2021 0606   GFRAA >60 07/09/2016 0438       Component Value Date/Time   WBC 5.8 06/11/2021 0606   RBC 4.12 06/11/2021 0606  HGB 12.5 06/11/2021 0606   HCT 39.7 06/11/2021 0606   PLT 299 06/11/2021 0606   MCV 96.4 06/11/2021 0606   MCH 30.3 06/11/2021 0606   MCHC 31.5 06/11/2021 0606   RDW 13.9 06/11/2021 0606   LYMPHSABS 1.9 06/11/2021 0606   MONOABS 0.5 06/11/2021 0606   EOSABS 0.1 06/11/2021 0606   BASOSABS 0.0 06/11/2021 0606    No results found for: "POCLITH", "LITHIUM"   No results found for: "PHENYTOIN", "PHENOBARB", "VALPROATE", "CBMZ"   .res Assessment: Plan:    Joaquina was seen today for follow-up, depression and anxiety.  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  Impaired function but less so with Spravato re: depression..   She is receiving Spravato 84 mg weekly and marked improvement in the depression..  she feels it also helps OCD somewhat.  However still easily overwhelmed with low stress tolerance.  The OCD is improved some with the increase in fluvoxamine and with Spravato.  Spends 2 hours daily and checking compulsions.  She has been on higher doses of fluvoxamine above the usual max of 400 mg daily in the past.  This  became difficult to obtain at 1 point and the dose was reduced to 300 mg daily.   Disc SE. She would like to continue Luvox 400 mg daily again    She is tolerating the meds well  Continue Increased Luvox back to 400 mg nightly as of January 2023. Disc dosing higher than usual.  She feels this is increase has helped more with OCD which remains chronically severe.   increased Auvelity BID and reduced Wellbutrin to 150 AM on 04/13/22 We discussed potential side effects and drug interaction issues.  Disc Spravato DT TRD incl details and SE. Disc dosing and duration.  Pt with severe depression MADRS 36 on 06/29/21  Patient was administered Spravato 84 mg intranasally dosage today.  The patient experienced the typical dissociation which gradually resolved over the 2-hour period of observation.  There were no complications.  Specifically the patient did not have nausea or vomiting or headache.  Blood pressures remained within normal ranges at the 40-minute and 2-hour follow-up intervals.  By the time the 2-hour observation period was met the patient was alert and oriented and able to exit without assistance. She tends to have lingering sedative effects but not severe. .  See nursing note for further details. Per protocol will continue Spravato to 84 mg weekly.  She has been okay since cutting back to once weekly.  We discussed the short-term risks associated with benzodiazepines including sedation and increased fall risk among others.  Discussed long-term side effect risk including dependence, potential withdrawal symptoms, and the potential eventual dose-related risk of dementia.  But recent studies from 2020 dispute this association between benzodiazepines and dementia risk. Newer studies in 2020 do not support an association with dementia. Disc this is high dose and not ideal.  Also disc risk combining it with temazepam. Rec gradually reduce HS lorazepam to 1 mg Hs.  Can continue lorazepam 2 mg AM and 1  mg in afternoon bc of chronic anxiety and it is helpful and tolerated. She can continue temazepam 30 mg nightly.  She tends to have a lot of anxious negative thoughts at night when she is trying to  go to bed  Consider olanzapine for TR anxiety and TRD but sig risk weight gain.  Supportive therapy dealing with some of the recent stressors including family stressors and her own health related to cerebral palsy   Follow-up weekly .    Lynder Parents, MD, DFAPA  Please see After Visit Summary for patient specific instructions.  Future Appointments  Date Time Provider [place]  05/09/2022 11:00 AM Blanchie Serve, PhD CP-[facility] None  06/13/2022 11:00 AM Blanchie Serve, PhD [facility] None    No orders of the defined types were placed in this encounter.    -------------------------------

## 2022-05-09 ENCOUNTER — Ambulatory Visit (INDEPENDENT_AMBULATORY_CARE_PROVIDER_SITE_OTHER): Payer: 59 | Admitting: Psychiatry

## 2022-05-09 DIAGNOSIS — F66 Other sexual disorders: Secondary | ICD-10-CM | POA: Diagnosis not present

## 2022-05-09 DIAGNOSIS — F4010 Social phobia, unspecified: Secondary | ICD-10-CM

## 2022-05-09 DIAGNOSIS — G809 Cerebral palsy, unspecified: Secondary | ICD-10-CM

## 2022-05-09 DIAGNOSIS — F339 Major depressive disorder, recurrent, unspecified: Secondary | ICD-10-CM | POA: Diagnosis not present

## 2022-05-09 DIAGNOSIS — F422 Mixed obsessional thoughts and acts: Secondary | ICD-10-CM

## 2022-05-09 NOTE — Progress Notes (Signed)
NURSE Visit:   Pt arrived for her weekly Spravato Treatment, she started Spravato treatments on 10/07/2021, she continues with 84 mg (3 of the 28 mg) Spravato nasal spray once a week treatments, which is the maintenance dose for her treatment resistant depression.   She was directed to the treatment room to get vitals taken first. Pt's mood today was much better and seemed her normal self.  Her B/P at 3:40 PM 114/74, 91, Pt instructed to blow her nose and to recline back at 45 degrees. Pt given first nasal spray (28 mg) administered by pt observed by nurse. Pt always has to go to the restroom after her first dose, at that point she is not too medicated to work to bathroom safely.There were 5 minutes between each dose, total of 84 mg. Tolerated well. Pt's medication is delivered by [facility] in [place] and stored inside a safe behind a locked door as well. Spravato is a CIII medication and has to be only given at a treatment facility and observed by nurse as pt administered intranasally.  Pt's 40 minute vital signs at 4:20 PM 113/68, 88. Dr. Clovis Pu met with pt to discuss her care at the end of her treatment when her thoughts are clearer and they discussed her medication and moods. She does go to the bathroom at least once during her treatment and again towards the end when she is clearer and able to walk. No sedation and had slight feeling of being "high" she reports. Discharge vitals at 5:30 PM 119/77, 84. Pt stable for discharge.  Pt was observed on site a total of 120 minutes per FDA/REMS requirements. Pt was with nurse for clinical assessment 50 minutes.  [id number]
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
## 2022-05-17 ENCOUNTER — Other Ambulatory Visit: Payer: Self-pay | Admitting: Psychiatry

## 2022-05-17 DIAGNOSIS — F339 Major depressive disorder, recurrent, unspecified: Secondary | ICD-10-CM

## 2022-05-25 ENCOUNTER — Other Ambulatory Visit: Payer: Self-pay

## 2022-05-25 ENCOUNTER — Ambulatory Visit (INDEPENDENT_AMBULATORY_CARE_PROVIDER_SITE_OTHER): Payer: 59 | Admitting: Psychiatry

## 2022-05-25 ENCOUNTER — Ambulatory Visit: Payer: 59

## 2022-05-25 VITALS — BP 132/87 | HR 72

## 2022-05-25 DIAGNOSIS — F5105 Insomnia due to other mental disorder: Secondary | ICD-10-CM

## 2022-05-25 DIAGNOSIS — F339 Major depressive disorder, recurrent, unspecified: Secondary | ICD-10-CM

## 2022-05-25 DIAGNOSIS — F4010 Social phobia, unspecified: Secondary | ICD-10-CM

## 2022-05-25 DIAGNOSIS — F422 Mixed obsessional thoughts and acts: Secondary | ICD-10-CM | POA: Diagnosis not present

## 2022-05-25 MED ORDER — TEMAZEPAM 30 MG PO CAPS: ORAL_CAPSULE | ORAL | 1 refills | Status: DC

## 2022-05-25 NOTE — Progress Notes (Addendum)
Casey Diaz [id number] July 10, 1966 56 y.o.    Subjective:   Patient ID:  Casey Diaz is a 56 y.o. (DOB January 21, 1966) female.  Chief Complaint:  Chief Complaint  Patient presents with   Follow-up   Depression   Anxiety     HPI Casey Diaz presents to the office today for follow-up of OCD and severe anxiety.     December 2019 visit the following was noted: No meds were changed. Lives in [place] and back for followup.  Sx are about the same.  Has to take meds with different sizes. Pt reports that mood is Anxious and Depressed and describes anxiety as Severe. Anxiety symptoms include: Excessive Worry, Obsessive Compulsive Symptoms:   Checking,,. Pt reports has interrupted sleep and nocturia. Pt reports that appetite is good. Pt reports that energy is no change and down slightly. Concentration is down slightly. Suicidal thoughts:  denied by patient. Loves the environment of [place] but misses some things there.  She's not able to work there.  H works there and likes it.  Struggled with not working, feels isolated and not up to task of meeting people.  Does attend a church and met a friend who's been helpful.  Leaving for [place] on 10/16/18.   04/09/2020 appointment the following is noted:  Staying another year in [place] bc Covid and other things. Last few months a lot of crying spells.  Is in menopause. Wonders about med changes though is nervous about it.  Crying spells associated with depressing thoughts more than stress or OCD.   Covid really hard on everyone and couldn't see family for 18 mos.  Family still very dysfunctional. No close friends in part due to OCD and depression. Son high Autism spectrum with ADHD and anxiety and she's with him all the time. Greater health problems with CP so more pains.   05/15/20 appt with the following noted: Casey Diaz for menopause and helps some. Still depressed.  Chronically. In [place] for 2 more weeks then to [place] for another  year. A lot of stressors lately triggering more checking and anxiety.   OCD is her CC now and seems.  Got worse DT stress.   Stressed with Asberger's son and her health.  H works a lot.  Her FOO still stress. Plan: Trintellix 10 mg 1 tablet in the morning with food and reduce fluvoxamine to 5 tablets nightly for 1 week  then reduce it to 4 tablets nightly.   07/02/20 appt with the following noted: Decided not to get Trintellix bc difficulty getting it. It is available.  There.  Wants to start it now.   Both depression and OCD are severe.  Not suicidal in intent or plan. Did not take samples with her to [place] but will be back in December. covid is worse there and travel is difficult.  Wants to reduce Wellbutrin DT dry mouth. Plan: She's afraid to reduce Luvox at this time DT fear of worsening OCD.  But will consider. Trintellix 10 mg 1 tablet in the morning with food and reduce fluvoxamine to 5 tablets nightly for 1 week  then reduce it to 4 tablets nightly. Also reduce Wellbutrin XL to 300 mg daily.    9-13 2022 appointment with the following noted: Back in [place] since July 14.  Broke arm a month ago and surgery.  It's all been rough adjustment.   B has cancer on his face and M fell taking him to the doctor.  Misses the water  and weather of [place].   Cry a lot more since menopause. Still depression and anxiety and OCD.  Asks about ketamine. On Wellbutrin 300, Luvox 300.  No Trintellix. Added Ativan 2 mg AM and HS and it helps.  More likely to get upset at night. Plan: Increase Luvox back to 400 mg daily.  She thinks she's worse on less. Continue Wellbutrin XL to 300 mg daily. 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  She tolerated it well without unusual nausea or vomiting headache or other somatic symptoms.  She did have the expected dissociation which gradually resolved over the course  of the 2-hour period of observation.  She was a little concerned about her balance given her cerebral palsy but has not noted unusual or unexpected problems.  She is motivated to can continue Spravato in hopes of reducing her depressive symptoms. 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  She also has treatment resistant OCD which is partially managed with medications but is still quite disabling.  She is tolerating the medications well.  She is sleeping adequately.  Her appetite is adequate.  She is not having suicidal thoughts.  She continues to wish for a better treatment for OCD that would give her some relief.  09/30/2021 appointment with the following noted: She received her first dose of Spravato 84 mg intranasally today.  She tolerated it well without unusual nausea, vomiting, or other somatic symptoms.  Dissociation as expected did occur and gradually resolved over the 2-hour period of observation.  She did have a mild headache today with the treatment and received ibuprofen 600 mg at her request.  We will follow this to see if it is a pattern Patient is still depressed.  She said she was late with her medicine today and today was a particularly depressing day.  However she notes that the Spravato has lifted her mood considerably even today.  She is hopeful that it will continue to be helpful.  No suicidal thoughts.  She has ongoing chronic anxiety and OCD at baseline.  10/04/21 appt noted: Patient received Spravato 84 mg for the second time today.  She tolerated it well without any unusual headache, nausea or vomiting or other somatic symptoms.  Dissociation did occur and she gradually [employer] resolution over the 2-hour period of observation. She did not have any unusual problems after she left the office last Spravato administration.  She did not have any specific problems with balance or walking.  She is at increased risk of that difficulty because of cerebral  palsy.  So far she has not noticed much mood effect from the medication beyond the first day of receiving it.  However she would like to continue Spravato in hopes of getting the antidepressant effect that is desired. Stress dealing with mother's behavior at party pt hosted.  Guilt over it.  10/07/2021 appointment noted: Patient received Spravato 84 mg for the second time today.  She tolerated it well without any unusual headache, nausea or vomiting or other somatic symptoms.  Dissociation did occur and she gradually Kelley resolution over the 2-hour period of observation. She still is not sure about the antidepressant effect of Spravato.  Events over the holidays and demands, make it difficult to assess.  She still notes that the OCD tends to worsen the depression and vice versa.  She tolerates the Spravato well and  wants to continue the trial.  10/15/2021 appointment with the following noted: Patient received Spravato 84 mg for the second time today.  She tolerated it well without any unusual headache, nausea or vomiting or other somatic symptoms.  Dissociation did occur and she gradually [place] resolution over the 2-hour period of observation. Patient says it was somewhat difficult to evaluate the effect of the Spravato.  It was scheduled to be twice weekly for 4 weeks consecutively but the holidays have interfered with that administration.  She asked what specifically should be she should be looking for in order to assess improvement.  That was discussed.  The OCD is unchanged and the depression so far is not significantly different.  She still tolerates meds.  There have been no recent med changes  10/19/2021 appt noted: Patient received Spravato 84 mg for the second today.  She tolerated it well without any unusual headache, nausea or vomiting or other somatic symptoms.  Dissociation did occur and she gradually saw resolution over the 2-hour period of observation.   10/21/2021 appointment noted: Patient  received Spravato 84 mg today.  She tolerated it well without any unusual headache, nausea or vomiting or other somatic symptoms.  Dissociation did occur and she gradually saw resolution over the 2-hour period of observation.  She feels better than last week.  She is not as depressed and down.  She is still dealing with grief around the death of her cousin that was unexpected.  It is still difficult to tell how much the Spravato was doing but she is hopeful.  Anxiety is still present with the OCD.  She is not having suicidal thoughts.  She is not hopeless.  She wants to continue treatment.  10/25/2021 appointment with the following noted: Patient received Spravato 84 mg today.  She tolerated it well without any unusual headache, nausea or vomiting or other somatic symptoms.  Dissociation did occur and she gradually saw resolution over the 2-hour period of observation.  She does not typically find the dissociation very strong. She is beginning to think the Spravato is helping somewhat with the depression.  It has been difficult to tell with the holidays intervening as well as the death of her cousin.  She has not been able to get Spravato twice weekly for 4 weeks straight as typically planned.  However she is hopeful.  The OCD remains significant.  She still has a tendency to think very negatively.  She is not suicidal.  10/28/2021 appointment with the following noted: Patient received Spravato 84 mg today.  She tolerated it well without any unusual headache, nausea or vomiting or other somatic symptoms.  Dissociation did occur and she gradually saw resolution over the 2-hour period of observation.  She does not typically find the dissociation very strong. She is feeling more hopeful about the administration of Spravato.  She is having less depression she believes.  Still not dramatically different.  She still has a tendency to have a lot of anxiety and rumination and OCD.  She is not suicidal.  She is eager to  continue the Spravato.  11/01/2021 appointment with the following noted: Patient received Spravato 84 mg today.  She tolerated it well without any unusual headache, nausea or vomiting or other somatic symptoms.  Dissociation did occur and she gradually saw resolution over the 2-hour period of observation.  She does not typically find the dissociation very strong. She is continuing to see a little bit of improvement in depression with Spravato.  The anxiety remains but may be not as severe.  The OCD remains markedly severe chronically.  She is not suicidal.  She is encouraged by the degree of improvement with Spravato and inability to enjoy things more and not be quite as ruminative.  11/04/2021 appt noted: Patient received Spravato 84 mg today.  She tolerated it well without any unusual headache, nausea or vomiting or other somatic symptoms.  Dissociation did occur and she gradually saw resolution over the 2-hour period of observation.  She does not typically find the dissociation very strong. No SE complaints with meds. She continues to feel hopeful about the Spravato.  She has less depression.  Because of a number of factors she is uncertain of the full benefit but thinks she is somewhat less depressed.  Her anxiety and OCD remain significant but a little better.  She is tolerating the medications and does not desire medicine change.  She is not currently complaining of insomnia.   11/08/2021 appointment the following noted: Patient received Spravato 84 mg today.  She tolerated it well without any unusual headache, nausea or vomiting or other somatic symptoms.  Dissociation did occur and she gradually saw resolution over the 2-hour period of observation.  She does not typically find the dissociation very strong. No SE complaints with meds. She feels the Spravato is helping somewhat.  She would like to see a greater effect.  However she is able to enjoy things.  She is productive at home.  She would like  to see a lifting of a degree of sadness that remains.  The anxiety and OCD remained largely unchanged.  She wondered about the dosing of Wellbutrin 300 mg a day and Luvox 300 mg a day and possible increases.  She has been at higher doses in the past.  She plans to start water therapy for her weakness and for her shoulder.  11/11/2021 appointment with the following noted: Patient received Spravato 84 mg today.  She tolerated it well without any unusual headache, nausea or vomiting or other somatic symptoms.  Dissociation did occur and she gradually saw resolution over the 2-hour period of observation.  She does not typically find the dissociation very strong. No SE complaints with meds. She feels the Spravato is clearly helping the depression.  She would like to see a more significant effect.  She is still having trouble thinking positive. Her energy is fair.  Concentration is good except for the problem with chronic obsessions. She has been taking Wellbutrin 300 mg in Luvox 300 mg for quite some time but has taken higher doses in the past.  We discussed that.  She would like to try higher doses in order to get a better effect if possible. We just increased the doses a couple of days ago.  No effect yet.  11/15/2021 appointment with the following noted: Patient received Spravato 84 mg today.  She tolerated it well without any unusual headache, nausea or vomiting or other somatic symptoms.  Dissociation did occur and she gradually saw resolution over the 2-hour period of observation.  She does not typically find the dissociation very strong. No SE complaints with meds. The patient is now convinced that the Spravato is helping the depression.  She would like to continue twice weekly Spravato this week if possible.  She has tolerated the increase in Wellbutrin to 450 mg daily and the increase and fluvoxamine to 400 mg daily without complications thus far.  The OCD and anxiety feed the depression to  some  extent. She spends approximately 2 hours daily with checking compulsions due to obsessions about causing harm to others.  For example fearing that when she has hit a pot hole that she may have hit a person and going back to check.  Checking corners and rooms out of fear that she may have harmed someone.  Other various checking compulsions.  She is hoping the increase in fluvoxamine to 400 mg will reduce that over the weeks to come.  She is not seeing a significant difference with the addition of the Spravato though she understands that was not expected.  She is more productive at home and more motivated and able to enjoy things more fully as a result of the Spravato treatment.  She is tolerating the medication  11/18/2021 appointment with the following noted: Patient received Spravato 84 mg today.  She tolerated it well without any unusual headache, nausea or vomiting or other somatic symptoms.  Dissociation did occur and she gradually saw resolution over the 2-hour period of observation.  She does not typically find the dissociation very strong. No SE complaints with meds. She clearly believes the Spravato has been helpful for the depression.  She wonders whether to continue to treatments weekly or to cut back to 1 weekly.  She would like to continue twice weekly in hopes of getting additional improvement in the depression because it is not resolved but it is difficult to get here twice a week in terms of arranging rides. She is recently increased Wellbutrin XL to 450 mg daily and fluvoxamine to 400 mg daily but they have not had time to have an official effect.  She is tolerating that well.  She is tolerating meds overwork overall well. The OCD remains the same as noted on 11/15/2021  11/25/21 appt noted: Patient received Spravato 84 mg today.  She tolerated it well without any unusual headache, nausea or vomiting or other somatic symptoms.  Dissociation did occur and she gradually saw resolution over the  2-hour period of observation.  She does not typically find the dissociation very strong. No SE complaints with meds. She thinks the increase in Wellbutrin and Luvox have been potentially helpful for depression and OCD respectively.  It has been too early to see the full effect.  She is sleeping and eating well.  She is functioning at home.  She still spends a lot of time that is about 2 hours a day dealing with compulsive behaviors.  12/02/21 appt noted: Patient received Spravato 84 mg today.  She tolerated it well without any unusual headache, nausea or vomiting or other somatic symptoms.  Dissociation did occur and she gradually saw resolution over the 2-hour period of observation.  She does not typically find the dissociation very strong. No SE complaints with meds. Several losses and stressors recently that affect her sense of mood. However still sees significant benefit from the Spravato for her depression.  Wants to continue it. Suspect early  some benefit from the increased Wellbutrin for depression and Luvox for OCD. Tolerating meds. No complaints about the meds. Sleeping and eating well.  No new health concerns.  12/09/21 appt noted: Patient received Spravato 84 mg today.  She tolerated it well without any unusual headache, nausea or vomiting or other somatic symptoms.  Dissociation did occur and she gradually saw resolution over the 2-hour period of observation.  She does not typically find the dissociation very strong. No SE complaints with meds. Seeing noticeable improvement from increase fluvoxamine to  400 mg daily.  Tolerating meds without concerns over them. Depression is stable with residual sx of easy guilt and easily stressed.  OCD contributes to depression but depression is not severe with less crying spells.  Productive at home with chores.  Enjoyed recent birthday.  Sleeping good. No new concerns.  12/23/2021 appointment noted: Patient received Spravato 84 mg today.  She  tolerated it well without any unusual headache, nausea or vomiting or other somatic symptoms.  Dissociation did occur and she gradually saw resolution over the 2-hour period of observation.  She does not typically find the dissociation very strong. No SE complaints with meds. Seeing noticeable improvement from increase fluvoxamine to 400 mg daily.  Tolerating meds without concerns over them. Her depression is somewhat improved with the Spravato.  She also feels generally a little lighter.  She is more motivated.  She is less overwhelmed by guilt.  The OCD is gradually improving but is still quite time-consuming as noted before.  She is sleeping well.  No side effects  12/30/2021 appointment with the following noted: Patient received Spravato 84 mg today.  She tolerated it well without any unusual headache, nausea or vomiting or other somatic symptoms.  Dissociation did occur and she gradually saw resolution over the 2-hour period of observation.  She does not typically find the dissociation very strong. No SE complaints with meds. Seeing noticeable improvement from increase fluvoxamine to 400 mg daily.  Tolerating meds without concerns over them. She is confident of her the improvement seen with Spravato.  She is less hopeless.  Guilt is marked remarkably improved.  She is not having any thoughts of death or dying.  She is more motivated for activities such as exercise which she is recently started.  She is sleeping well. The OCD remains severe but it is improving somewhat with the increase in fluvoxamine.  It is still consuming a couple hours per day.  01/10/22 apravato 84 admin  01/24/22 appt noted: Patient received Spravato 84 mg today.  She tolerated it well without any unusual headache, nausea or vomiting or other somatic symptoms.  Dissociation did occur and she gradually saw resolution over the 2-hour period of observation.  She does not typically find the dissociation very strong. No SE  complaints with meds. Very tearful today.  Feels like she has been suppressing emotion in the Spravato caused it to be released.  Discussed some stressors.  Overall still feels the medicine is helpful.  She has missed some of the scheduled Spravato treatments that were intended to be weekly due to circumstances beyond her control.  She is still struggling with OCD as previously noted but does believe the medications are helpful. Plan no med changes  01/31/2022 received Spravato 84 mg today  02/09/2022 appointment with the following noted: Patient received Spravato 84 mg today.  She tolerated it well without any unusual headache, nausea or vomiting or other somatic symptoms.  Dissociation did occur and she gradually saw resolution over the 2-hour period of observation.  She does not typically find the dissociation very strong. No SE complaints with meds. Spravato clearly helps depression and OCD but easily gets overwhelmed and tearful with fairly routine stressors.  Tolerating meds. Sleep and appetite is OK Asks to increase lorazepam to 2 mg AM and HS and 46m afternoon  02/16/22 appt noted: Patient received Spravato 84 mg today.  She tolerated it well without any unusual headache, nausea or vomiting or other somatic symptoms.  Dissociation did occur and she  gradually saw resolution over the 2-hour period of observation.  She does not typically find the dissociation very strong. No SE complaints with meds. She has chronic depesssion and OCD but is improved with Spravato, both dx versus before.  She has continued Luvox 400 mg and Wellbutrin 450 mg and is tolerating it.  Chronically easily stressed.  Tolerating all meds.  Doesn't like taking more meds.  Spending a couple hours daily with OCD.  No SI No med changes.  02/21/22 appt noted:   Doesn't like taking more meds.  Spending a couple hours daily with OCD.  No SI No med changes.  02/21/22 appt noted: Patient received Spravato 84 mg today.  She  tolerated it well without any unusual headache, nausea or vomiting or other somatic symptoms.  Dissociation did occur and she gradually saw resolution over the 2-hour period of observation.  She does not typically find the dissociation very strong. No SE complaints with meds. She has chronic depesssion and OCD but is improved with Spravato, both dx versus before.  She has continued Luvox 400 mg and Wellbutrin 450 mg and is tolerating it.  Chronically easily overwhelmed and doesn't know why.  Tolerating all meds. Wants to continue meds.  03/16/22 appt noted: Patient received Spravato 84 mg today.  She tolerated it well without any unusual headache, nausea or vomiting or other somatic symptoms.  Dissociation did occur and she gradually saw resolution over the 2-hour period of observation.  She does not typically find the dissociation very strong. No SE complaints with meds. Overall she still feels the Spravato has been helpful not only for her depression but also for her OCD which was somewhat unexpected.  OCD is still significant but it is less severe than prior to starting Spravato.  She is tolerating Luvox 400 mg and Wellbutrin 450 mg.  We discussed possible med adjustments.  03/23/22 appt noted: Patient received Spravato 84 mg today.  She tolerated it well without any unusual headache, nausea or vomiting or other somatic symptoms.  Dissociation did occur and she gradually saw resolution over the 2-hour period of observation.  She does not typically find the dissociation very strong. No SE complaints with meds. She is still depressed and still has OCD of course but is improved with the Spravato.  She is tolerating the medications well.  We had previously discussed the possibility of switching some of the Wellbutrin to [facility] and she is very interested in that in hopes of further improvement in depression and OCD.  She understands that Auvelity is not used for OCD on the label.  She is tolerating the  medications.  She is still easily overwhelmed.  She is sleeping and eating okay.. Plan: Reduce Wellbutrin XL to 300 mg AM and add Auvelity 1 tablet each AM  03/30/22 appt noted: Patient received Spravato 84 mg today.  She tolerated it well without any unusual headache, nausea or vomiting or other somatic symptoms.  Dissociation did occur and she gradually saw resolution over the 2-hour period of observation.  She does not typically find the dissociation very strong. No SE complaints with meds. She is still depressed and still has OCD of course but is improved with the Spravato.  She is tolerating the medications well.  No difference with Auvelity 1 AM so far and no SE.  Going on vacation on Saturday. 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 Sleep and appetite good. Plan: Increase Auvelity to 1 twice daily and reduce Wellbutrin to XL 150  every morning  04/14/2022 appointment with the following noted: Patient received Spravato 84 mg today.  She tolerated it well without any unusual headache, nausea or vomiting or other somatic symptoms.  Dissociation did occur and she gradually saw resolution over the 2-hour period of observation.  She does not typically find the dissociation very strong. No SE complaints with meds. She is still depressed and still has OCD of course but is improved with the Spravato.  She is tolerating the medications well.  Just increased Auvelity to BID yesterday and reduced Wellbutrin to 150 AM. No SE so far.  No change in mood or anxiety so far.  Chronic OCD as noted and residual depression and chronic fatigue.  04/21/2022 appointment with the following noted: Patient received Spravato 84 mg today.  She tolerated it well without any unusual headache, nausea or vomiting or other somatic symptoms.  Dissociation did occur and she gradually saw resolution over the 2-hour period of observation.  She does not typically find the dissociation very strong. No SE complaints with  meds. She is still depressed and still has OCD of course but is improved with the Spravato.   She has questions about the dosing of lorazepam. She tends to have negative anxious thoughts at night.  This tends to interfere with her ability to go to sleep.  She is getting about 8 to 9 hours of sleep.  She is tolerating the meds without excessive sedation and does not nap during the day.  05/06/22 appt noted: Patient received Spravato 84 mg today.  She tolerated it well without any unusual headache, nausea or vomiting or other somatic symptoms.  Dissociation did occur and she gradually saw resolution over the 2-hour period of observation.  She does not typically find the dissociation very strong. No SE complaints with meds. She is still depressed and still has OCD of course but is improved with the Spravato.   Had some questions about timing of dosing of fluvoxamine and Auvelity. OCD is not quite as time consuming.  Sleep and eating are the same.   No SE meds.  05/25/22 appt noted: Patient received Spravato 84 mg today.  She tolerated it well without any unusual headache, nausea or vomiting or other somatic symptoms.  Dissociation did occur and she gradually saw resolution over the 2-hour period of observation.  She does not typically find the dissociation very strong. No SE complaints with meds. She is still depressed and still has OCD of course but is improved with the Spravato.   She has less OCD when away from home and on vacation of note. Plan: Rec gradually reduce HS lorazepam to 1 mg Hs.  Can continue lorazepam 2 mg AM and 1 mg in afternoon bc of chronic anxiety and it is helpful and tolerated. She can continue temazepam 30 mg nightly.  She tends to have a lot of anxious negative thoughts at night when she is trying to go to bed     Previous psych med trials include Prozac, paroxetine, sertraline, fluvoxamine, venlafaxine, Anafranil with no response,  Wellbutrin, , Viibryd, Trintellix 10 1  month NR Geodon,  risperidone, Rexulti, Abilify,  Seroquel, Latuda 40 mg with irritability.  lamotrigine lithium,  BuSpar, Namenda,  pramipexole with no response, and Topamax, pindolol  ECT-MADRS    [place] Office Visit from 06/29/2021 in [facility]  MADRS Total Score 36      Flowsheet Row Admission (Discharged) from 06/11/2021 in [facility]  No Risk        Review of Systems:  Review of Systems  Constitutional:  Positive for fatigue.  Cardiovascular:  Negative for chest pain and palpitations.  Musculoskeletal:  Positive for arthralgias, back pain and gait problem. Negative for joint swelling.  Neurological:  Positive for weakness. Negative for tremors.  Psychiatric/Behavioral:  Positive for dysphoric mood. Negative for agitation and suicidal ideas. The patient is nervous/anxious.     Medications: I have reviewed the patient's current medications.  Current Outpatient Medications  Medication Sig Dispense Refill   Abaloparatide (TYMLOS) 3120 MCG/1.56ML SOPN Inject into the skin.     Azelastine-Fluticasone 137-50 MCG/ACT SUSP Place 1-2 sprays into both nostrils daily.     baclofen (LIORESAL) 10 MG tablet Take 20 mg by mouth at bedtime as needed for muscle spasms.     buPROPion (WELLBUTRIN XL) 150 MG 24 hr tablet TAKE 3 TABLETS BY MOUTH DAILY 270 tablet 0   Dextromethorphan-buPROPion ER (AUVELITY) 45-105 MG TBCR Take 1 tablet by mouth 2 (two) times daily.     dicyclomine (BENTYL) 10 MG capsule Take 10 mg by mouth daily.     docusate sodium (COLACE) 100 MG capsule Take 1 capsule (100 mg total) by mouth 2 (two) times daily. (Patient taking differently: Take 100 mg by mouth daily.) 10 capsule 0   Esketamine HCl, 84 MG Dose, (SPRAVATO, 84 MG DOSE,) 28 MG/DEVICE SOPK USE 3 SPRAYS IN EACH NOSTRIL ONCE A WEEK 3 each 3   fexofenadine (ALLEGRA) 180 MG tablet Take 180 mg by mouth daily.     fluvoxaMINE (LUVOX) 100 MG tablet Take  one tablet (100 mg) by mouth every morning and 3 tablets (300 mg) at bedtime. 120 tablet 5   hydrocortisone (ANUSOL-HC) 2.5 % rectal cream Place rectally 2 (two) times daily. x 7-14 days 30 g 0   ketotifen (ZADITOR) 0.025 % ophthalmic solution Place 3 drops into both eyes at bedtime.     LORazepam (ATIVAN) 1 MG tablet TAKE 1-2 IN THE AM AND 1-2 TABLETS EVERY NIGHT AT BEDTIME AND 1 TABLET IN AFTERNOON when needed for anxiety and sleep 150 tablet 3   magnesium gluconate (MAGONATE) 500 MG tablet Take 500 mg by mouth daily.     MIBELAS 24 FE 1-20 MG-MCG(24) CHEW Chew 1 tablet by mouth at bedtime as needed (bowel regularity).     Multiple Vitamins-Minerals (ADULT GUMMY PO) Take 2 tablets by mouth in the morning.     nitrofurantoin (MACRODANTIN) 100 MG capsule Take 100 mg by mouth as needed (For urinary tract infection.).      oxyCODONE-acetaminophen (PERCOCET/ROXICET) 5-325 MG tablet Take 1-2 tablets by mouth every 6 (six) hours as needed for severe pain. 50 tablet 0   polyethylene glycol (MIRALAX / GLYCOLAX) packet Take 17 g by mouth daily as needed for mild constipation. 14 each 0   psyllium (METAMUCIL) 58.6 % powder Take 1 packet by mouth daily as needed (constipation).     temazepam (RESTORIL) 30 MG capsule TAKE 1 CAPSULE BY MOUTH AT BEDTIME AS NEEDED FOR SLEEP. 30 capsule 1   Vitamin D-Vitamin K (VITAMIN K2-VITAMIN D3 PO) Take 1-2 sprays by mouth daily.     No current facility-administered medications for this visit.    Medication Side Effects: None   Allergies:  Allergies  Allergen Reactions   Hydrocodone Itching   Sulfamethoxazole-Trimethoprim Itching   Dust Mite Extract Other (See Comments)    Sneezing, watery eyes, runny nose   Latex Itching   Other Other (See  Comments)    PT IS ALLERGIC TO CAT DANDER AND RAGWEED - Sneezing, watery eyes, runny nose    Pollen Extract Other (See Comments)    Sneezing, watery eyes, runny nose     Past Medical History:  Diagnosis Date   Abnormal  Pap smear 2011   hpv/mild dysplasia,cin1   Anxiety    Cerebral palsy (HCC)    right arm/leg   Cystocele    Depression    Headache    Neuromuscular disorder (HCC)    Cerebral Palsy   OCD (obsessive compulsive disorder)    Osteoporosis    Uterine prolaps     Family History  Problem Relation Age of Onset   Cancer Father        skin AND LUNG   Alcohol abuse Sister        CRACK COCAINE    Social History   Socioeconomic History   Marital status: Married    Spouse name: Not on file   Number of children: Not on file   Years of education: Not on file   Highest education level: Not on file  Occupational History   Not on file  Tobacco Use   Smoking status: Never   Smokeless tobacco: Never  Substance and Sexual Activity   Alcohol use: Not Currently    Comment: OCCASIONAL beer   Drug use: No   Sexual activity: Yes    Birth control/protection: Pill    Comment: LOESTRIN 24 FE  Other Topics Concern   Not on file  Social History Narrative   Not on file   Social Determinants of Health   Financial Resource Strain: Not on file  Food Insecurity: Not on file  Transportation Needs: Not on file  Physical Activity: Not on file  Stress: Not on file  Social Connections: Not on file  Intimate Partner Violence: Not on file    Past Medical History, Surgical history, Social history, and Family history were reviewed and updated as appropriate.   Please see review of systems for further details on the patient's review from today.   Objective:   Physical Exam:  LMP  (LMP Unknown)   Physical Exam Neurological:     Mental Status: She is alert and oriented to person, place, and time.     Cranial Nerves: No dysarthria.     Motor: Weakness present.     Gait: Gait abnormal.  Psychiatric:        Attention and Perception: Attention and perception normal.        Mood and Affect: Mood is anxious and depressed. Affect is not labile.        Speech: Speech normal. Speech is not slurred.         Behavior: Behavior normal. Behavior is cooperative.        Thought Content: Thought content normal. Thought content is not delusional. Thought content does not include homicidal or suicidal ideation. Thought content does not include suicidal plan.        Cognition and Memory: Cognition and memory normal. Cognition is not impaired.        Judgment: Judgment normal.     Comments: Insight intact Pleasant. Ongoing OCD remains fairly severe but less anxious Checking compulsions up to 2 hours daily but improved noticeably Chronic depression persistent but better with Spravato      Lab Review:     Component Value Date/Time   NA 138 06/11/2021 0606   K 4.0 06/11/2021 0606   CL 107 06/11/2021 0606  CO2 26 06/11/2021 0606   GLUCOSE 90 06/11/2021 0606   BUN 18 06/11/2021 0606   CREATININE 0.81 06/11/2021 0606   CALCIUM 9.4 06/11/2021 0606   PROT 6.5 06/11/2021 0606   ALBUMIN 3.3 (L) 06/11/2021 0606   AST 17 06/11/2021 0606   ALT 14 06/11/2021 0606   ALKPHOS 141 (H) 06/11/2021 0606   BILITOT 0.2 (L) 06/11/2021 0606   GFRNONAA >60 06/11/2021 0606   GFRAA >60 07/09/2016 0438       Component Value Date/Time   WBC 5.8 06/11/2021 0606   RBC 4.12 06/11/2021 0606   HGB 12.5 06/11/2021 0606   HCT 39.7 06/11/2021 0606   PLT 299 06/11/2021 0606   MCV 96.4 06/11/2021 0606   MCH 30.3 06/11/2021 0606   MCHC 31.5 06/11/2021 0606   RDW 13.9 06/11/2021 0606   LYMPHSABS 1.9 06/11/2021 0606   MONOABS 0.5 06/11/2021 0606   EOSABS 0.1 06/11/2021 0606   BASOSABS 0.0 06/11/2021 0606    No results found for: "POCLITH", "LITHIUM"   No results found for: "PHENYTOIN", "PHENOBARB", "VALPROATE", "CBMZ"   .res Assessment: Plan:    Margart was seen today for follow-up, depression and anxiety.  Diagnoses and all orders for this visit:  Recurrent major depression resistant to treatment [facility])  Mixed obsessional thoughts and acts  Social anxiety disorder  Insomnia due to mental  condition    Both Dx are TR and marked.  Impaired function but less so with Spravato re: depression..   She is receiving Spravato 84 mg weekly and marked improvement in the depression..  she feels it also helps OCD somewhat.  However still easily overwhelmed with low stress tolerance.  The OCD is improved some with the increase in fluvoxamine and with Spravato.  Spends 2 hours daily and checking compulsions.  She has been on higher doses of fluvoxamine above the usual max of 400 mg daily in the past.  This became difficult to obtain at 1 point and the dose was reduced to 300 mg daily.   Disc SE. She would like to continue Luvox 400 mg daily again    She is tolerating the meds well  Continue Increased Luvox back to 400 mg nightly as of January 2023. Disc dosing higher than usual.  She feels this is increase has helped more with OCD which remains chronically severe.   increased Auvelity BID and reduced Wellbutrin to 150 AM on 04/13/22 We discussed potential side effects and drug interaction issues.  Disc Spravato DT TRD incl details and SE. Disc dosing and duration.  Pt with severe depression MADRS 36 on 06/29/21  Patient was administered Spravato 84 mg intranasally dosage today.  The patient experienced the typical dissociation which gradually resolved over the 2-hour period of observation.  There were no complications.  Specifically the patient did not have nausea or vomiting or headache.  Blood pressures remained within normal ranges at the 40-minute and 2-hour follow-up intervals.  By the time the 2-hour observation period was met the patient was alert and oriented and able to exit without assistance. She tends to have lingering sedative effects but not severe. .  See nursing note for further details. Per protocol will continue Spravato to 84 mg weekly.  She has been okay since cutting back to once weekly.  We discussed the short-term risks associated with benzodiazepines including sedation  and increased fall risk among others.  Discussed long-term side effect risk including dependence, potential withdrawal symptoms, and the potential eventual dose-related risk of dementia.  But recent studies from 2020 dispute this association between benzodiazepines and dementia risk. Newer studies in 2020 do not support an association with dementia. Disc this is high dose and not ideal.  Also disc risk combining it with temazepam. Rec gradually reduce HS lorazepam to 1 mg Hs.  Can continue lorazepam 2 mg AM and 1 mg in afternoon bc of chronic anxiety and it is helpful and tolerated. She can continue temazepam 30 mg nightly.  She tends to have a lot of anxious negative thoughts at night when she is trying to go to bed  Consider olanzapine for TR anxiety and TRD but sig risk weight gain.  Supportive therapy dealing with some of the recent stressors including family stressors and her own health related to cerebral palsy   Follow-up weekly .    Lynder Parents, MD, DFAPA  Please see After Visit Summary for patient specific instructions.  Future Appointments  Date Time Provider [place]  07/06/2022 11:00 AM Blanchie Serve, PhD CP-CP None  07/29/2022 11:00 AM Blanchie Serve, PhD CP-CP None  08/16/2022 11:00 AM Blanchie Serve, PhD CP-CP None  08/30/2022 11:00 AM Blanchie Serve, PhD CP-CP None  09/13/2022 11:00 AM Blanchie Serve, PhD CP-CP None    No orders of the defined types were placed in this encounter.    -------------------------------

## 2022-05-31 NOTE — Progress Notes (Signed)
NURSE Visit:   Pt arrived for her weekly Spravato Treatment, she started Spravato treatments on 10/07/2021, she continues with 84 mg (3 of the 28 mg) Spravato nasal spray once a week treatments, which is the maintenance dose for her treatment resistant depression.   She was directed to the treatment room to get vitals taken first. Pt's mood was anxious due to her Mom having a stroke over the weekend, plus the pt and her husband are leaving for [place] on Saturday for a week and she is worried about her Mom. Listened to pt discuss issues then got her B/P at 3:05 PM 109/76, 92, Pt instructed to blow her nose and to recline back at 45 degrees. Pt given first nasal spray (28 mg) administered by pt observed by nurse. Pt always has to go to the restroom after her first dose, at that point she is not too medicated to walk to bathroom safely.There were 5 minutes between each dose, total of 84 mg. Tolerated well. Pt's medication is delivered by [facility] in [place] and stored inside a safe behind a locked door as well. Spravato is a CIII medication and has to be only given at a treatment facility and observed by nurse as pt administered intranasally.  Pt's 40 minute vital signs at 3:45 PM 101/61, 70. Dr. Clovis Pu met with pt to discuss her care at the end of her treatment when her thoughts are clearer and they discussed her medication and moods. She does go to the bathroom at least once during her treatment and again towards the end when she is clearer and able to walk. No sedation and had slight feeling of being "high" she reports. Discharge vitals at 5:25 PM 132/87, 72. Pt stable for discharge.  Pt was observed on site a total of 120 minutes per FDA/REMS requirements. Pt was with nurse for clinical assessment 50 minutes. Pt will start coming every other week instead of weekly. Her next apt is August 23rd, Wednesday at [facility]

## 2022-06-06 ENCOUNTER — Ambulatory Visit: Payer: 59 | Admitting: Psychiatry

## 2022-06-08 ENCOUNTER — Encounter: Payer: 59 | Admitting: Behavioral Health

## 2022-06-13 ENCOUNTER — Ambulatory Visit: Payer: 59 | Admitting: Psychiatry

## 2022-06-16 ENCOUNTER — Ambulatory Visit (INDEPENDENT_AMBULATORY_CARE_PROVIDER_SITE_OTHER): Payer: 59 | Admitting: Psychiatry

## 2022-06-16 ENCOUNTER — Ambulatory Visit: Payer: 59

## 2022-06-16 VITALS — BP 111/75 | HR 76

## 2022-06-16 DIAGNOSIS — F4010 Social phobia, unspecified: Secondary | ICD-10-CM | POA: Diagnosis not present

## 2022-06-16 DIAGNOSIS — F339 Major depressive disorder, recurrent, unspecified: Secondary | ICD-10-CM | POA: Diagnosis not present

## 2022-06-16 DIAGNOSIS — F422 Mixed obsessional thoughts and acts: Secondary | ICD-10-CM

## 2022-06-16 DIAGNOSIS — F5105 Insomnia due to other mental disorder: Secondary | ICD-10-CM

## 2022-06-18 ENCOUNTER — Other Ambulatory Visit: Payer: Self-pay | Admitting: Psychiatry

## 2022-06-18 DIAGNOSIS — F339 Major depressive disorder, recurrent, unspecified: Secondary | ICD-10-CM

## 2022-06-21 ENCOUNTER — Encounter: Payer: Self-pay | Admitting: Psychiatry

## 2022-06-21 NOTE — Progress Notes (Signed)
Casey Diaz [id number] July 10, 1966 56 y.o.    Subjective:   Patient ID:  Casey Diaz is a 56 y.o. (DOB January 21, 1966) female.  Chief Complaint:  Chief Complaint  Patient presents with   Follow-up   Depression   Anxiety     HPI Casey Diaz presents to the office today for follow-up of OCD and severe anxiety.     December 2019 visit the following was noted: No meds were changed. Lives in [place] and back for followup.  Sx are about the same.  Has to take meds with different sizes. Pt reports that mood is Anxious and Depressed and describes anxiety as Severe. Anxiety symptoms include: Excessive Worry, Obsessive Compulsive Symptoms:   Checking,,. Pt reports has interrupted sleep and nocturia. Pt reports that appetite is good. Pt reports that energy is no change and down slightly. Concentration is down slightly. Suicidal thoughts:  denied by patient. Loves the environment of [place] but misses some things there.  She's not able to work there.  H works there and likes it.  Struggled with not working, feels isolated and not up to task of meeting people.  Does attend a church and met a friend who's been helpful.  Leaving for [place] on 10/16/18.   04/09/2020 appointment the following is noted:  Staying another year in [place] bc Covid and other things. Last few months a lot of crying spells.  Is in menopause. Wonders about med changes though is nervous about it.  Crying spells associated with depressing thoughts more than stress or OCD.   Covid really hard on everyone and couldn't see family for 18 mos.  Family still very dysfunctional. No close friends in part due to OCD and depression. Son high Autism spectrum with ADHD and anxiety and she's with him all the time. Greater health problems with CP so more pains.   05/15/20 appt with the following noted: Casey Diaz for menopause and helps some. Still depressed.  Chronically. In [place] for 2 more weeks then to [place] for another  year. A lot of stressors lately triggering more checking and anxiety.   OCD is her CC now and seems.  Got worse DT stress.   Stressed with Asberger's son and her health.  H works a lot.  Her FOO still stress. Plan: Trintellix 10 mg 1 tablet in the morning with food and reduce fluvoxamine to 5 tablets nightly for 1 week  then reduce it to 4 tablets nightly.   07/02/20 appt with the following noted: Decided not to get Trintellix bc difficulty getting it. It is available.  There.  Wants to start it now.   Both depression and OCD are severe.  Not suicidal in intent or plan. Did not take samples with her to [place] but will be back in December. covid is worse there and travel is difficult.  Wants to reduce Wellbutrin DT dry mouth. Plan: She's afraid to reduce Luvox at this time DT fear of worsening OCD.  But will consider. Trintellix 10 mg 1 tablet in the morning with food and reduce fluvoxamine to 5 tablets nightly for 1 week  then reduce it to 4 tablets nightly. Also reduce Wellbutrin XL to 300 mg daily.    9-13 2022 appointment with the following noted: Back in [place] since July 14.  Broke arm a month ago and surgery.  It's all been rough adjustment.   B has cancer on his face and M fell taking him to the doctor.  Misses the water  and weather of [place].   Cry a lot more since menopause. Still depression and anxiety and OCD.  Asks about ketamine. On Wellbutrin 300, Luvox 300.  No Trintellix. Added Ativan 2 mg AM and HS and it helps.  More likely to get upset at night. Plan: Increase Luvox back to 400 mg daily.  She thinks she's worse on less. Continue Wellbutrin XL to 300 mg daily. 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  She tolerated it well without unusual nausea or vomiting headache or other somatic symptoms.  She did have the expected dissociation which gradually resolved over the course  of the 2-hour period of observation.  She was a little concerned about her balance given her cerebral palsy but has not noted unusual or unexpected problems.  She is motivated to can continue Spravato in hopes of reducing her depressive symptoms. 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  She also has treatment resistant OCD which is partially managed with medications but is still quite disabling.  She is tolerating the medications well.  She is sleeping adequately.  Her appetite is adequate.  She is not having suicidal thoughts.  She continues to wish for a better treatment for OCD that would give her some relief.  09/30/2021 appointment with the following noted: She received her first dose of Spravato 84 mg intranasally today.  She tolerated it well without unusual nausea, vomiting, or other somatic symptoms.  Dissociation as expected did occur and gradually resolved over the 2-hour period of observation.  She did have a mild headache today with the treatment and received ibuprofen 600 mg at her request.  We will follow this to see if it is a pattern Patient is still depressed.  She said she was late with her medicine today and today was a particularly depressing day.  However she notes that the Spravato has lifted her mood considerably even today.  She is hopeful that it will continue to be helpful.  No suicidal thoughts.  She has ongoing chronic anxiety and OCD at baseline.  10/04/21 appt noted: Patient received Spravato 84 mg for the second time today.  She tolerated it well without any unusual headache, nausea or vomiting or other somatic symptoms.  Dissociation did occur and she gradually [employer] resolution over the 2-hour period of observation. She did not have any unusual problems after she left the office last Spravato administration.  She did not have any specific problems with balance or walking.  She is at increased risk of that difficulty because of cerebral  palsy.  So far she has not noticed much mood effect from the medication beyond the first day of receiving it.  However she would like to continue Spravato in hopes of getting the antidepressant effect that is desired. Stress dealing with mother's behavior at party pt hosted.  Guilt over it.  10/07/2021 appointment noted: Patient received Spravato 84 mg for the second time today.  She tolerated it well without any unusual headache, nausea or vomiting or other somatic symptoms.  Dissociation did occur and she gradually Kelley resolution over the 2-hour period of observation. She still is not sure about the antidepressant effect of Spravato.  Events over the holidays and demands, make it difficult to assess.  She still notes that the OCD tends to worsen the depression and vice versa.  She tolerates the Spravato well and  wants to continue the trial.  10/15/2021 appointment with the following noted: Patient received Spravato 84 mg for the second time today.  She tolerated it well without any unusual headache, nausea or vomiting or other somatic symptoms.  Dissociation did occur and she gradually [place] resolution over the 2-hour period of observation. Patient says it was somewhat difficult to evaluate the effect of the Spravato.  It was scheduled to be twice weekly for 4 weeks consecutively but the holidays have interfered with that administration.  She asked what specifically should be she should be looking for in order to assess improvement.  That was discussed.  The OCD is unchanged and the depression so far is not significantly different.  She still tolerates meds.  There have been no recent med changes  10/19/2021 appt noted: Patient received Spravato 84 mg for the second today.  She tolerated it well without any unusual headache, nausea or vomiting or other somatic symptoms.  Dissociation did occur and she gradually saw resolution over the 2-hour period of observation.   10/21/2021 appointment noted: Patient  received Spravato 84 mg today.  She tolerated it well without any unusual headache, nausea or vomiting or other somatic symptoms.  Dissociation did occur and she gradually saw resolution over the 2-hour period of observation.  She feels better than last week.  She is not as depressed and down.  She is still dealing with grief around the death of her cousin that was unexpected.  It is still difficult to tell how much the Spravato was doing but she is hopeful.  Anxiety is still present with the OCD.  She is not having suicidal thoughts.  She is not hopeless.  She wants to continue treatment.  10/25/2021 appointment with the following noted: Patient received Spravato 84 mg today.  She tolerated it well without any unusual headache, nausea or vomiting or other somatic symptoms.  Dissociation did occur and she gradually saw resolution over the 2-hour period of observation.  She does not typically find the dissociation very strong. She is beginning to think the Spravato is helping somewhat with the depression.  It has been difficult to tell with the holidays intervening as well as the death of her cousin.  She has not been able to get Spravato twice weekly for 4 weeks straight as typically planned.  However she is hopeful.  The OCD remains significant.  She still has a tendency to think very negatively.  She is not suicidal.  10/28/2021 appointment with the following noted: Patient received Spravato 84 mg today.  She tolerated it well without any unusual headache, nausea or vomiting or other somatic symptoms.  Dissociation did occur and she gradually saw resolution over the 2-hour period of observation.  She does not typically find the dissociation very strong. She is feeling more hopeful about the administration of Spravato.  She is having less depression she believes.  Still not dramatically different.  She still has a tendency to have a lot of anxiety and rumination and OCD.  She is not suicidal.  She is eager to  continue the Spravato.  11/01/2021 appointment with the following noted: Patient received Spravato 84 mg today.  She tolerated it well without any unusual headache, nausea or vomiting or other somatic symptoms.  Dissociation did occur and she gradually saw resolution over the 2-hour period of observation.  She does not typically find the dissociation very strong. She is continuing to see a little bit of improvement in depression with Spravato.  The anxiety remains but may be not as severe.  The OCD remains markedly severe chronically.  She is not suicidal.  She is encouraged by the degree of improvement with Spravato and inability to enjoy things more and not be quite as ruminative.  11/04/2021 appt noted: Patient received Spravato 84 mg today.  She tolerated it well without any unusual headache, nausea or vomiting or other somatic symptoms.  Dissociation did occur and she gradually saw resolution over the 2-hour period of observation.  She does not typically find the dissociation very strong. No SE complaints with meds. She continues to feel hopeful about the Spravato.  She has less depression.  Because of a number of factors she is uncertain of the full benefit but thinks she is somewhat less depressed.  Her anxiety and OCD remain significant but a little better.  She is tolerating the medications and does not desire medicine change.  She is not currently complaining of insomnia.   11/08/2021 appointment the following noted: Patient received Spravato 84 mg today.  She tolerated it well without any unusual headache, nausea or vomiting or other somatic symptoms.  Dissociation did occur and she gradually saw resolution over the 2-hour period of observation.  She does not typically find the dissociation very strong. No SE complaints with meds. She feels the Spravato is helping somewhat.  She would like to see a greater effect.  However she is able to enjoy things.  She is productive at home.  She would like  to see a lifting of a degree of sadness that remains.  The anxiety and OCD remained largely unchanged.  She wondered about the dosing of Wellbutrin 300 mg a day and Luvox 300 mg a day and possible increases.  She has been at higher doses in the past.  She plans to start water therapy for her weakness and for her shoulder.  11/11/2021 appointment with the following noted: Patient received Spravato 84 mg today.  She tolerated it well without any unusual headache, nausea or vomiting or other somatic symptoms.  Dissociation did occur and she gradually saw resolution over the 2-hour period of observation.  She does not typically find the dissociation very strong. No SE complaints with meds. She feels the Spravato is clearly helping the depression.  She would like to see a more significant effect.  She is still having trouble thinking positive. Her energy is fair.  Concentration is good except for the problem with chronic obsessions. She has been taking Wellbutrin 300 mg in Luvox 300 mg for quite some time but has taken higher doses in the past.  We discussed that.  She would like to try higher doses in order to get a better effect if possible. We just increased the doses a couple of days ago.  No effect yet.  11/15/2021 appointment with the following noted: Patient received Spravato 84 mg today.  She tolerated it well without any unusual headache, nausea or vomiting or other somatic symptoms.  Dissociation did occur and she gradually saw resolution over the 2-hour period of observation.  She does not typically find the dissociation very strong. No SE complaints with meds. The patient is now convinced that the Spravato is helping the depression.  She would like to continue twice weekly Spravato this week if possible.  She has tolerated the increase in Wellbutrin to 450 mg daily and the increase and fluvoxamine to 400 mg daily without complications thus far.  The OCD and anxiety feed the depression to  some  extent. She spends approximately 2 hours daily with checking compulsions due to obsessions about causing harm to others.  For example fearing that when she has hit a pot hole that she may have hit a person and going back to check.  Checking corners and rooms out of fear that she may have harmed someone.  Other various checking compulsions.  She is hoping the increase in fluvoxamine to 400 mg will reduce that over the weeks to come.  She is not seeing a significant difference with the addition of the Spravato though she understands that was not expected.  She is more productive at home and more motivated and able to enjoy things more fully as a result of the Spravato treatment.  She is tolerating the medication  11/18/2021 appointment with the following noted: Patient received Spravato 84 mg today.  She tolerated it well without any unusual headache, nausea or vomiting or other somatic symptoms.  Dissociation did occur and she gradually saw resolution over the 2-hour period of observation.  She does not typically find the dissociation very strong. No SE complaints with meds. She clearly believes the Spravato has been helpful for the depression.  She wonders whether to continue to treatments weekly or to cut back to 1 weekly.  She would like to continue twice weekly in hopes of getting additional improvement in the depression because it is not resolved but it is difficult to get here twice a week in terms of arranging rides. She is recently increased Wellbutrin XL to 450 mg daily and fluvoxamine to 400 mg daily but they have not had time to have an official effect.  She is tolerating that well.  She is tolerating meds overwork overall well. The OCD remains the same as noted on 11/15/2021  11/25/21 appt noted: Patient received Spravato 84 mg today.  She tolerated it well without any unusual headache, nausea or vomiting or other somatic symptoms.  Dissociation did occur and she gradually saw resolution over the  2-hour period of observation.  She does not typically find the dissociation very strong. No SE complaints with meds. She thinks the increase in Wellbutrin and Luvox have been potentially helpful for depression and OCD respectively.  It has been too early to see the full effect.  She is sleeping and eating well.  She is functioning at home.  She still spends a lot of time that is about 2 hours a day dealing with compulsive behaviors.  12/02/21 appt noted: Patient received Spravato 84 mg today.  She tolerated it well without any unusual headache, nausea or vomiting or other somatic symptoms.  Dissociation did occur and she gradually saw resolution over the 2-hour period of observation.  She does not typically find the dissociation very strong. No SE complaints with meds. Several losses and stressors recently that affect her sense of mood. However still sees significant benefit from the Spravato for her depression.  Wants to continue it. Suspect early  some benefit from the increased Wellbutrin for depression and Luvox for OCD. Tolerating meds. No complaints about the meds. Sleeping and eating well.  No new health concerns.  12/09/21 appt noted: Patient received Spravato 84 mg today.  She tolerated it well without any unusual headache, nausea or vomiting or other somatic symptoms.  Dissociation did occur and she gradually saw resolution over the 2-hour period of observation.  She does not typically find the dissociation very strong. No SE complaints with meds. Seeing noticeable improvement from increase fluvoxamine to  400 mg daily.  Tolerating meds without concerns over them. Depression is stable with residual sx of easy guilt and easily stressed.  OCD contributes to depression but depression is not severe with less crying spells.  Productive at home with chores.  Enjoyed recent birthday.  Sleeping good. No new concerns.  12/23/2021 appointment noted: Patient received Spravato 84 mg today.  She  tolerated it well without any unusual headache, nausea or vomiting or other somatic symptoms.  Dissociation did occur and she gradually saw resolution over the 2-hour period of observation.  She does not typically find the dissociation very strong. No SE complaints with meds. Seeing noticeable improvement from increase fluvoxamine to 400 mg daily.  Tolerating meds without concerns over them. Her depression is somewhat improved with the Spravato.  She also feels generally a little lighter.  She is more motivated.  She is less overwhelmed by guilt.  The OCD is gradually improving but is still quite time-consuming as noted before.  She is sleeping well.  No side effects  12/30/2021 appointment with the following noted: Patient received Spravato 84 mg today.  She tolerated it well without any unusual headache, nausea or vomiting or other somatic symptoms.  Dissociation did occur and she gradually saw resolution over the 2-hour period of observation.  She does not typically find the dissociation very strong. No SE complaints with meds. Seeing noticeable improvement from increase fluvoxamine to 400 mg daily.  Tolerating meds without concerns over them. She is confident of her the improvement seen with Spravato.  She is less hopeless.  Guilt is marked remarkably improved.  She is not having any thoughts of death or dying.  She is more motivated for activities such as exercise which she is recently started.  She is sleeping well. The OCD remains severe but it is improving somewhat with the increase in fluvoxamine.  It is still consuming a couple hours per day.  01/10/22 apravato 84 admin  01/24/22 appt noted: Patient received Spravato 84 mg today.  She tolerated it well without any unusual headache, nausea or vomiting or other somatic symptoms.  Dissociation did occur and she gradually saw resolution over the 2-hour period of observation.  She does not typically find the dissociation very strong. No SE  complaints with meds. Very tearful today.  Feels like she has been suppressing emotion in the Spravato caused it to be released.  Discussed some stressors.  Overall still feels the medicine is helpful.  She has missed some of the scheduled Spravato treatments that were intended to be weekly due to circumstances beyond her control.  She is still struggling with OCD as previously noted but does believe the medications are helpful. Plan no med changes  01/31/2022 received Spravato 84 mg today  02/09/2022 appointment with the following noted: Patient received Spravato 84 mg today.  She tolerated it well without any unusual headache, nausea or vomiting or other somatic symptoms.  Dissociation did occur and she gradually saw resolution over the 2-hour period of observation.  She does not typically find the dissociation very strong. No SE complaints with meds. Spravato clearly helps depression and OCD but easily gets overwhelmed and tearful with fairly routine stressors.  Tolerating meds. Sleep and appetite is OK Asks to increase lorazepam to 2 mg AM and HS and 46m afternoon  02/16/22 appt noted: Patient received Spravato 84 mg today.  She tolerated it well without any unusual headache, nausea or vomiting or other somatic symptoms.  Dissociation did occur and she  gradually saw resolution over the 2-hour period of observation.  She does not typically find the dissociation very strong. No SE complaints with meds. She has chronic depesssion and OCD but is improved with Spravato, both dx versus before.  She has continued Luvox 400 mg and Wellbutrin 450 mg and is tolerating it.  Chronically easily stressed.  Tolerating all meds.  Doesn't like taking more meds.  Spending a couple hours daily with OCD.  No SI No med changes.  02/21/22 appt noted:   Doesn't like taking more meds.  Spending a couple hours daily with OCD.  No SI No med changes.  02/21/22 appt noted: Patient received Spravato 84 mg today.  She  tolerated it well without any unusual headache, nausea or vomiting or other somatic symptoms.  Dissociation did occur and she gradually saw resolution over the 2-hour period of observation.  She does not typically find the dissociation very strong. No SE complaints with meds. She has chronic depesssion and OCD but is improved with Spravato, both dx versus before.  She has continued Luvox 400 mg and Wellbutrin 450 mg and is tolerating it.  Chronically easily overwhelmed and doesn't know why.  Tolerating all meds. Wants to continue meds.  03/16/22 appt noted: Patient received Spravato 84 mg today.  She tolerated it well without any unusual headache, nausea or vomiting or other somatic symptoms.  Dissociation did occur and she gradually saw resolution over the 2-hour period of observation.  She does not typically find the dissociation very strong. No SE complaints with meds. Overall she still feels the Spravato has been helpful not only for her depression but also for her OCD which was somewhat unexpected.  OCD is still significant but it is less severe than prior to starting Spravato.  She is tolerating Luvox 400 mg and Wellbutrin 450 mg.  We discussed possible med adjustments.  03/23/22 appt noted: Patient received Spravato 84 mg today.  She tolerated it well without any unusual headache, nausea or vomiting or other somatic symptoms.  Dissociation did occur and she gradually saw resolution over the 2-hour period of observation.  She does not typically find the dissociation very strong. No SE complaints with meds. She is still depressed and still has OCD of course but is improved with the Spravato.  She is tolerating the medications well.  We had previously discussed the possibility of switching some of the Wellbutrin to [facility] and she is very interested in that in hopes of further improvement in depression and OCD.  She understands that Auvelity is not used for OCD on the label.  She is tolerating the  medications.  She is still easily overwhelmed.  She is sleeping and eating okay.. Plan: Reduce Wellbutrin XL to 300 mg AM and add Auvelity 1 tablet each AM  03/30/22 appt noted: Patient received Spravato 84 mg today.  She tolerated it well without any unusual headache, nausea or vomiting or other somatic symptoms.  Dissociation did occur and she gradually saw resolution over the 2-hour period of observation.  She does not typically find the dissociation very strong. No SE complaints with meds. She is still depressed and still has OCD of course but is improved with the Spravato.  She is tolerating the medications well.  No difference with Auvelity 1 AM so far and no SE.  Going on vacation on Saturday. 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 Sleep and appetite good. Plan: Increase Auvelity to 1 twice daily and reduce Wellbutrin to XL 150  every morning  04/14/2022 appointment with the following noted: Patient received Spravato 84 mg today.  She tolerated it well without any unusual headache, nausea or vomiting or other somatic symptoms.  Dissociation did occur and she gradually saw resolution over the 2-hour period of observation.  She does not typically find the dissociation very strong. No SE complaints with meds. She is still depressed and still has OCD of course but is improved with the Spravato.  She is tolerating the medications well.  Just increased Auvelity to BID yesterday and reduced Wellbutrin to 150 AM. No SE so far.  No change in mood or anxiety so far.  Chronic OCD as noted and residual depression and chronic fatigue.  04/21/2022 appointment with the following noted: Patient received Spravato 84 mg today.  She tolerated it well without any unusual headache, nausea or vomiting or other somatic symptoms.  Dissociation did occur and she gradually saw resolution over the 2-hour period of observation.  She does not typically find the dissociation very strong. No SE complaints with  meds. She is still depressed and still has OCD of course but is improved with the Spravato.   She has questions about the dosing of lorazepam. She tends to have negative anxious thoughts at night.  This tends to interfere with her ability to go to sleep.  She is getting about 8 to 9 hours of sleep.  She is tolerating the meds without excessive sedation and does not nap during the day.  05/06/22 appt noted: Patient received Spravato 84 mg today.  She tolerated it well without any unusual headache, nausea or vomiting or other somatic symptoms.  Dissociation did occur and she gradually saw resolution over the 2-hour period of observation.  She does not typically find the dissociation very strong. No SE complaints with meds. She is still depressed and still has OCD of course but is improved with the Spravato.   Had some questions about timing of dosing of fluvoxamine and Auvelity. OCD is not quite as time consuming.  Sleep and eating are the same.   No SE meds.  05/25/22 appt noted: Patient received Spravato 84 mg today.  She tolerated it well without any unusual headache, nausea or vomiting or other somatic symptoms.  Dissociation did occur and she gradually saw resolution over the 2-hour period of observation.  She does not typically find the dissociation very strong. No SE complaints with meds. She is still depressed and still has OCD of course but is improved with the Spravato.   She has less OCD when away from home and on vacation of note. Plan: Rec gradually reduce HS lorazepam to 1 mg Hs.  Can continue lorazepam 2 mg AM and 1 mg in afternoon bc of chronic anxiety and it is helpful and tolerated. She can continue temazepam 30 mg nightly.  She tends to have a lot of anxious negative thoughts at night when she is trying to go to bed  06/16/22 appt noted: Patient received Spravato 84 mg today.  She tolerated it well without any unusual headache, nausea or vomiting or other somatic symptoms.   Dissociation did occur and she gradually saw resolution over the 2-hour period of observation.  She does not typically find the dissociation very strong. No SE complaints with meds. She is still depressed and still has OCD of course but is improved with the Spravato.   She has less OCD when away from home and on vacation of note. She is tolerating the medications.  She has continued current medications. 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  She still receives benefit from [place].  Her depression and OCD are better with the Spravato.  She thinks she is getting additional benefit with the switch from Wellbutrin to [place].   Previous psych med trials include Prozac, paroxetine, sertraline, fluvoxamine, venlafaxine, Anafranil with no response,  Wellbutrin, , Viibryd, Trintellix 10 1 month NR Geodon,  risperidone, Rexulti, Abilify,  Seroquel, Latuda 40 mg with irritability.  lamotrigine lithium,  BuSpar, Namenda,  pramipexole with no response, and Topamax, pindolol  ECT-MADRS    [facility] Office Visit from 06/29/2021 in [facility] Total Score 36      Flowsheet Row Admission (Discharged) from 06/11/2021 in [facility] No Risk        Review of Systems:  Review of Systems  Constitutional:  Positive for fatigue.  Cardiovascular:  Negative for chest pain and palpitations.  Musculoskeletal:  Positive for arthralgias, back pain and gait problem. Negative for joint swelling.  Neurological:  Positive for weakness. Negative for dizziness and tremors.  Psychiatric/Behavioral:  Positive for dysphoric mood. Negative for agitation and  suicidal ideas. The patient is nervous/anxious.     Medications: I have reviewed the patient's current medications.  Current Outpatient Medications  Medication Sig Dispense Refill   Abaloparatide (TYMLOS) 3120 MCG/1.56ML SOPN Inject into the skin.     Azelastine-Fluticasone 137-50 MCG/ACT SUSP Place 1-2 sprays into both nostrils daily.     baclofen (LIORESAL) 10 MG tablet Take 20 mg by mouth at bedtime as needed for muscle spasms.     buPROPion (WELLBUTRIN XL) 150 MG 24 hr tablet TAKE 3 TABLETS BY MOUTH DAILY (Patient taking differently: Take 150 mg by mouth daily.) 270 tablet 0   Dextromethorphan-buPROPion ER (AUVELITY) 45-105 MG TBCR Take 1 tablet by mouth 2 (two) times daily.     dicyclomine (BENTYL) 10 MG capsule Take 10 mg by mouth daily.     docusate sodium (COLACE) 100 MG capsule Take 1 capsule (100 mg total) by mouth 2 (two) times daily. (Patient taking differently: Take 100 mg by mouth daily.) 10 capsule 0   Esketamine HCl, 84 MG Dose, (SPRAVATO, 84 MG DOSE,) 28 MG/DEVICE SOPK USE 3 SPRAYS IN EACH NOSTRIL ONCE A WEEK 3 each 3   fexofenadine (ALLEGRA) 180 MG tablet Take 180 mg by mouth daily.     fluvoxaMINE (LUVOX) 100 MG tablet Take one tablet (100 mg) by mouth every morning and 3 tablets (300 mg) at bedtime. 120 tablet 5   hydrocortisone (ANUSOL-HC) 2.5 % rectal cream Place rectally 2 (two) times daily. x 7-14 days 30 g 0   ketotifen (ZADITOR) 0.025 % ophthalmic solution Place 3 drops into both eyes at bedtime.     LORazepam (ATIVAN) 1 MG tablet TAKE 1-2 IN THE AM AND 1-2 TABLETS EVERY NIGHT AT BEDTIME AND 1 TABLET IN AFTERNOON when needed for anxiety and sleep 150 tablet 3   magnesium gluconate (MAGONATE) 500 MG tablet Take 500 mg by mouth daily.     MIBELAS 24 FE 1-20 MG-MCG(24) CHEW Chew 1 tablet by mouth at bedtime as needed (  bowel regularity).     Multiple Vitamins-Minerals (ADULT GUMMY PO) Take 2 tablets by mouth in the morning.     nitrofurantoin (MACRODANTIN) 100 MG capsule  Take 100 mg by mouth as needed (For urinary tract infection.).      oxyCODONE-acetaminophen (PERCOCET/ROXICET) 5-325 MG tablet Take 1-2 tablets by mouth every 6 (six) hours as needed for severe pain. 50 tablet 0   polyethylene glycol (MIRALAX / GLYCOLAX) packet Take 17 g by mouth daily as needed for mild constipation. 14 each 0   psyllium (METAMUCIL) 58.6 % powder Take 1 packet by mouth daily as needed (constipation).     temazepam (RESTORIL) 30 MG capsule TAKE 1 CAPSULE BY MOUTH AT BEDTIME AS NEEDED FOR SLEEP. 30 capsule 1   Vitamin D-Vitamin K (VITAMIN K2-VITAMIN D3 PO) Take 1-2 sprays by mouth daily.     No current facility-administered medications for this visit.    Medication Side Effects: None   Allergies:  Allergies  Allergen Reactions   Hydrocodone Itching   Sulfamethoxazole-Trimethoprim Itching   Dust Mite Extract Other (See Comments)    Sneezing, watery eyes, runny nose   Latex Itching   Other Other (See Comments)    PT IS ALLERGIC TO CAT DANDER AND RAGWEED - Sneezing, watery eyes, runny nose    Pollen Extract Other (See Comments)    Sneezing, watery eyes, runny nose     Past Medical History:  Diagnosis Date   Abnormal Pap smear 2011   hpv/mild dysplasia,cin1   Anxiety    Cerebral palsy (HCC)    right arm/leg   Cystocele    Depression    Headache    Neuromuscular disorder (HCC)    Cerebral Palsy   OCD (obsessive compulsive disorder)    Osteoporosis    Uterine prolaps     Family History  Problem Relation Age of Onset   Cancer Father        skin AND LUNG   Alcohol abuse Sister        CRACK COCAINE    Social History   Socioeconomic History   Marital status: Married    Spouse name: Not on file   Number of children: Not on file   Years of education: Not on file   Highest education level: Not on file  Occupational History   Not on file  Tobacco Use   Smoking status: Never   Smokeless tobacco: Never  Substance and Sexual Activity   Alcohol use:  Not Currently    Comment: OCCASIONAL beer   Drug use: No   Sexual activity: Yes    Birth control/protection: Pill    Comment: LOESTRIN 24 FE  Other Topics Concern   Not on file  Social History Narrative   Not on file   Social Determinants of Health   Financial Resource Strain: Not on file  Food Insecurity: Not on file  Transportation Needs: Not on file  Physical Activity: Not on file  Stress: Not on file  Social Connections: Not on file  Intimate Partner Violence: Not on file    Past Medical History, Surgical history, Social history, and Family history were reviewed and updated as appropriate.   Please see review of systems for further details on the patient's review from today.   Objective:   Physical Exam:  LMP  (LMP Unknown)   Physical Exam Constitutional:      General: She is not in acute distress. Neurological:     Mental Status: She is alert and oriented to  person, place, and time.     Cranial Nerves: No dysarthria.     Motor: Weakness present.     Gait: Gait abnormal.  Psychiatric:        Attention and Perception: Attention and perception normal.        Mood and Affect: Mood is anxious and depressed. Affect is not labile.        Speech: Speech normal. Speech is not slurred.        Behavior: Behavior normal. Behavior is not slowed. Behavior is cooperative.        Thought Content: Thought content normal. Thought content is not delusional. Thought content does not include homicidal or suicidal ideation. Thought content does not include suicidal plan.        Cognition and Memory: Cognition and memory normal. Cognition is not impaired.        Judgment: Judgment normal.     Comments: Insight intact Pleasant. Ongoing OCD remains fairly severe but less anxious Checking compulsions up to 2 hours daily but improved noticeably Chronic depression persistent but better with Spravato      Lab Review:     Component Value Date/Time   NA 138 06/11/2021 0606   K 4.0  06/11/2021 0606   CL 107 06/11/2021 0606   CO2 26 06/11/2021 0606   GLUCOSE 90 06/11/2021 0606   BUN 18 06/11/2021 0606   CREATININE 0.81 06/11/2021 0606   CALCIUM 9.4 06/11/2021 0606   PROT 6.5 06/11/2021 0606   ALBUMIN 3.3 (L) 06/11/2021 0606   AST 17 06/11/2021 0606   ALT 14 06/11/2021 0606   ALKPHOS 141 (H) 06/11/2021 0606   BILITOT 0.2 (L) 06/11/2021 0606   GFRNONAA >60 06/11/2021 0606   GFRAA >60 07/09/2016 0438       Component Value Date/Time   WBC 5.8 06/11/2021 0606   RBC 4.12 06/11/2021 0606   HGB 12.5 06/11/2021 0606   HCT 39.7 06/11/2021 0606   PLT 299 06/11/2021 0606   MCV 96.4 06/11/2021 0606   MCH 30.3 06/11/2021 0606   MCHC 31.5 06/11/2021 0606   RDW 13.9 06/11/2021 0606   LYMPHSABS 1.9 06/11/2021 0606   MONOABS 0.5 06/11/2021 0606   EOSABS 0.1 06/11/2021 0606   BASOSABS 0.0 06/11/2021 0606    No results found for: "POCLITH", "LITHIUM"   No results found for: "PHENYTOIN", "PHENOBARB", "VALPROATE", "CBMZ"   .res Assessment: Plan:    Marleah was seen today for follow-up, depression and anxiety.  Diagnoses and all orders for this visit:  Recurrent major depression resistant to treatment [facility])  Mixed obsessional thoughts and acts  Social anxiety disorder  Insomnia due to mental condition    Both Dx are TR and marked.  Impaired function but less so with Spravato re: depression..   She is receiving Spravato 84 mg weekly and marked improvement in the depression..  she feels it also helps OCD somewhat.  However still easily overwhelmed with low stress tolerance.  The OCD is improved with the increase in fluvoxamine and with Spravato.  Spends 2 hours daily and checking compulsions on her worst days but better when she travels.  She has been on higher doses of fluvoxamine above the usual max of 400 mg daily in the past.  This became difficult to obtain at 1 point and the dose was reduced to 300 mg daily.   Disc SE. She would like to continue Luvox 400 mg  daily again    She is tolerating the meds well  Continue  Increased Luvox back to 400 mg nightly as of January 2023. Disc dosing higher than usual.  She feels this is increase has helped more with OCD which remains chronically severe.   increased Auvelity BID and reduced Wellbutrin to 150 AM on 04/13/22 We discussed potential side effects and drug interaction issues.  Disc Spravato DT TRD incl details and SE. Disc dosing and duration.  Pt with severe depression MADRS 36 on 06/29/21  Patient was administered Spravato 84 mg intranasally dosage today.  The patient experienced the typical dissociation which gradually resolved over the 2-hour period of observation.  There were no complications.  Specifically the patient did not have nausea or vomiting or headache.  Blood pressures remained within normal ranges at the 40-minute and 2-hour follow-up intervals.  By the time the 2-hour observation period was met the patient was alert and oriented and able to exit without assistance. She tends to have lingering sedative effects but not severe. .  See nursing note for further details. Per protocol will continue Spravato to 84 mg weekly.  She has been okay since cutting back to once weekly.  We discussed the short-term risks associated with benzodiazepines including sedation and increased fall risk among others.  Discussed long-term side effect risk including dependence, potential withdrawal symptoms, and the potential eventual dose-related risk of dementia.  But recent studies from 2020 dispute this association between benzodiazepines and dementia risk. Newer studies in 2020 do not support an association with dementia. Disc this is high dose and not ideal.  Also disc risk combining it with temazepam. Rec gradually reduce HS lorazepam to 1 mg Hs.  Can continue lorazepam 2 mg AM and 1 mg in afternoon bc of chronic anxiety and it is helpful and tolerated. She can continue temazepam 30 mg nightly.  She tends to  have a lot of anxious negative thoughts at night when she is trying to go to bed.  She is trying to reduce the dose.  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  Supportive therapy dealing with some of the recent stressors including family stressors and her own health related to cerebral palsy   Follow-up weekly .    Lynder Parents, MD, DFAPA  Please see After Visit Summary for patient specific instructions.  Future Appointments  Date Time Provider [place]  07/06/2022 11:00 AM Blanchie Serve, PhD CP-CP None  07/29/2022 11:00 AM Blanchie Serve, PhD CP-CP None  08/16/2022 11:00 AM Blanchie Serve, PhD CP-CP None  08/30/2022 11:00 AM Blanchie Serve, PhD CP-CP None  09/13/2022 11:00 AM Blanchie Serve, PhD CP-CP None    No orders of the defined types were placed in this encounter.    -------------------------------

## 2022-06-21 NOTE — Progress Notes (Signed)
NURSE Visit:   Pt arrived for her weekly Spravato Treatment, she started Spravato treatments on 10/07/2021, she continues with 84 mg (3 of the 28 mg) Spravato nasal spray once a week treatments, which is the maintenance dose for her treatment resistant depression.   She was directed to the treatment room to get vitals taken first. Initial vital signs are B/P at 3:20 PM 123/85, 83, Pt instructed to blow her nose and to recline back at 45 degrees. Pt given first nasal spray (28 mg) administered by pt observed by nurse. Pt always has to go to the restroom after her first dose, at that point she is not too medicated to walk to bathroom safely.There were 5 minutes between each dose, total of 84 mg. Tolerated well. Pt's medication is delivered by [facility] in [place] and stored inside a safe behind a locked door as well. Spravato is a CIII medication and has to be only given at a treatment facility and observed by nurse as pt administered intranasally.  Pt's 40 minute vital signs at 4:05 PM 121/80, 80. Dr. Clovis Pu met with pt to discuss her care at the end of her treatment when her thoughts are clearer and they discussed her medication and moods. She does go to the bathroom at least once during her treatment and again towards the end when she is clearer and able to walk. No sedation and had slight feeling of being "high" she reports. Discharge vitals at 5:10 PM 111/75, 76. Pt stable for discharge.  Pt was observed on site a total of 120 minutes per FDA/REMS requirements. Pt was with nurse for clinical assessment 50 minutes. Pt will start coming every other week instead of weekly.   [id number] EXP APR 2025

## 2022-06-23 ENCOUNTER — Telehealth: Payer: Self-pay | Admitting: Psychiatry

## 2022-06-23 NOTE — Telephone Encounter (Signed)
Pt LVM @ 12:23p stating she thinks she is having a reaction to the Availity.  She said she has skin rash on her face.  She needs someone to call her about that.  She also asked for refill of Lorazepam.  I advised her it looks like she has refills available.  No upcoming appts scheduled.

## 2022-06-24 ENCOUNTER — Other Ambulatory Visit: Payer: Self-pay | Admitting: Psychiatry

## 2022-06-24 DIAGNOSIS — F4010 Social phobia, unspecified: Secondary | ICD-10-CM

## 2022-06-24 MED ORDER — LORAZEPAM 1 MG PO TABS: ORAL_TABLET | ORAL | 1 refills | Status: DC

## 2022-06-24 NOTE — Telephone Encounter (Signed)
Typical medication rashes around the trunk of the body but I suppose anything is possible.  If she is not already taking an antihistamine start Zyrtec 1 each night.  I assume she will be coming back in soon for Spravato and we can talk about any other medicine changes.  For now just stay off the ability and continue the other meds as they are currently prescribed.  we will send in a refill of lorazepam.

## 2022-06-24 NOTE — Telephone Encounter (Signed)
Pt informed

## 2022-06-24 NOTE — Telephone Encounter (Signed)
Rtc to pt and she reports she is broke out around her nose and cheeks. She said it does not itch or bother her. She thought it was from stress but realized maybe it was the medication. She did stop it yesterday. She also contacted her dermatologist for an apt but she can't get in until 07/26/22. Informed her I would let [name] know and follow up with her.

## 2022-07-04 ENCOUNTER — Ambulatory Visit: Payer: 59

## 2022-07-04 ENCOUNTER — Ambulatory Visit (INDEPENDENT_AMBULATORY_CARE_PROVIDER_SITE_OTHER): Payer: 59 | Admitting: Psychiatry

## 2022-07-04 VITALS — BP 98/73 | HR 83

## 2022-07-04 DIAGNOSIS — G809 Cerebral palsy, unspecified: Secondary | ICD-10-CM

## 2022-07-04 DIAGNOSIS — F339 Major depressive disorder, recurrent, unspecified: Secondary | ICD-10-CM

## 2022-07-04 DIAGNOSIS — F66 Other sexual disorders: Secondary | ICD-10-CM

## 2022-07-04 DIAGNOSIS — F422 Mixed obsessional thoughts and acts: Secondary | ICD-10-CM

## 2022-07-04 DIAGNOSIS — F5105 Insomnia due to other mental disorder: Secondary | ICD-10-CM | POA: Diagnosis not present

## 2022-07-04 DIAGNOSIS — F4010 Social phobia, unspecified: Secondary | ICD-10-CM | POA: Diagnosis not present

## 2022-07-04 NOTE — Progress Notes (Signed)
NURSE Visit:   Pt arrived for her weekly Spravato Treatment, she started Spravato treatments on 10/07/2021, she continues with 84 mg (3 of the 28 mg) Spravato nasal spray once a week treatments, which is the maintenance dose for her treatment resistant depression.   She was directed to the treatment room to get vitals taken first. Initial vital signs are B/P at 3:10 PM 120/80, 80, Pt instructed to blow her nose and to recline back at 45 degrees. Pt given first nasal spray (28 mg) administered by pt observed by nurse. Pt always has to go to the restroom after her first dose, at that point she is not too medicated to walk to bathroom safely.There were 5 minutes between each dose, total of 84 mg. Tolerated well. Pt's medication is delivered by [facility] in [place] and stored inside a safe behind a locked door as well. Spravato is a CIII medication and has to be only given at a treatment facility and observed by nurse as pt administered intranasally.  Pt's 40 minute vital signs at 4:00 PM 112/71, 79. Dr. Clovis Pu met with pt to discuss her care at the end of her treatment when her thoughts are clearer and they discussed her medication and moods. She does go to the bathroom at least once during her treatment and again towards the end when she is clearer and able to walk. No sedation and had slight feeling of being "high" she reports. Discharge vitals at 5:15 PM 98/73, 83. Pt stable for discharge.  Pt was observed on site a total of 120 minutes per FDA/REMS requirements. Pt was with nurse for clinical assessment 50 minutes. Pt will start coming every other week instead of weekly. Pt. Is scheduled on October 2nd.   [id number]  EXP APR 2025

## 2022-07-05 ENCOUNTER — Ambulatory Visit: Payer: 59 | Admitting: Psychiatry

## 2022-07-06 ENCOUNTER — Ambulatory Visit (INDEPENDENT_AMBULATORY_CARE_PROVIDER_SITE_OTHER): Payer: 59 | Admitting: Psychiatry

## 2022-07-06 ENCOUNTER — Encounter: Payer: Self-pay | Admitting: Psychiatry

## 2022-07-06 DIAGNOSIS — F339 Major depressive disorder, recurrent, unspecified: Secondary | ICD-10-CM

## 2022-07-06 DIAGNOSIS — Z638 Other specified problems related to primary support group: Secondary | ICD-10-CM

## 2022-07-06 DIAGNOSIS — F422 Mixed obsessional thoughts and acts: Secondary | ICD-10-CM

## 2022-07-06 DIAGNOSIS — F4010 Social phobia, unspecified: Secondary | ICD-10-CM

## 2022-07-06 DIAGNOSIS — F66 Other sexual disorders: Secondary | ICD-10-CM

## 2022-07-06 DIAGNOSIS — G809 Cerebral palsy, unspecified: Secondary | ICD-10-CM

## 2022-07-06 NOTE — Progress Notes (Unsigned)
Psychotherapy Progress Note [facility] Casey Moore, PhD LP  Patient ID: Casey Diaz (Casey "Eustaquio Maize")    MRN: [id number] Therapy format: Individual psychotherapy Date: 07/06/2022      Start: 11:17a     Stop: 12:04p     Time Spent: 47 min Location: In-person   Session narrative (presenting needs, interim history, self-report of stressors and symptoms, applications of prior therapy, status changes, and interventions made in session) Sciatic pain today.  Substantial drama with mother and brother Casey Diaz, who have moved and been through two bedbug crises.  Casey Diaz remains bipolar, is basically taking care of mom, but routinely has unrealistic expectations about possessions.    On the FOI front, Casey Diaz has been complaining more that she needs to not get drawn into her FOO issues and put more attention on them.  Reacts to when she complains about Casey Diaz's behavior (mostly normal adolescent cleaning issues), though she says her complaints are basically only weary mentions, nothing excessive, but Casey Diaz appears to be sensitized.  He has continued an old protest about not liking spending time with her family, too, including all extended family.  Noted that he gets weary and just wants to sit quietly, in touch, not talk about anything.  Impression that Casey Diaz has unrealistic demands about her choosing between families, and probably longterm depression himself.  C/o him wanting sex more often than she can stomach, leading back to her revelation last time that he typically only wants anal.  (Ironically enough, she had to take phone call from him right after acknowledging how this makes her feel objectified rather than loved.)  Legitimately, Casey Diaz is also very grieved right now about a friend's cancer diagnosis.  Further exploration, Casey Diaz is way out of his comfort zone at work, managing people, when he would rather just be an [name].  Other indications he may actually be on the spectrum himself.  Discussed at  length how her typical response to just try not to say anything complaining herself is not working to get him to lighten up and desensitize, her unassertiveness about sexual practices is only making her lonelier and feeling more like leaving altogether, and constructive ways to address each.  Says Casey Diaz has said he'll go to couples counseling, but couldn't do that here because of our long acquaintance, would have to be biased, or feel like it anyway.  Acknowledged obvious difference and certainly OK to seek a new counselor.  Previewed likely needs, and priorities for her sake in recruiting Casey Diaz to recognize when things like being weary with parenting or trying to help her family are normal, not pathological nor abandoning him in any way, and if he can get above certain feelings and take perspective, he'll suffer less for them, and demand less in response.    For his part, Casey Diaz has made the transition back to domestic life, doing better than she thought he might, and more of things are normal adolescent female.  Just really misses the cuddly kid he used to be.  Validated how much she, having long felt too different and alone, would have loved to create a human being who will love her unconditionally and be her buddy.  Nothing pathological about that, just bound to end with or without autism, and inevitable for a child growing up.  Now attending [employer] and coming to feel comfortable there after many years of unequal interest in church,  loss of beloved pastor, and going through loss of her former church as  father passed, mother's situation changed, and the nuclear family lived [place] for several years.  Therapeutic modalities: Cognitive Behavioral Therapy, Solution-Oriented/Positive Psychology, Ego-Supportive, and Assertiveness/Communication  Mental Status/Observations:  Appearance:   Casual     Behavior:  Appropriate  Motor:  Normal and exc CP-related limits  Speech/Language:   Clear and Coherent   Affect:  Appropriate  Mood:  sad and more responsive  Thought process:  normal  Thought content:    Rumination and no obsessions noted  Sensory/Perceptual disturbances:    WNL  Orientation:  Fully oriented  Attention:  Good    Concentration:  Good  Memory:  WNL  Insight:    Good  Judgment:   Fair  Impulse Control:  Good   Risk Assessment: Danger to Self: No Self-injurious Behavior: No Danger to Others: No Physical Aggression / Violence: No Duty to Warn: No Access to Firearms a concern: No  Assessment of progress:  stabilized  Diagnosis:   ICD-10-CM   1. Recurrent major depression resistant to treatment ([facility])  F33.9     2. Sexual relationship problem  F66     3. Relationship problem with family member  Z63.8     4. Mixed obsessional thoughts and acts  F42.2     5. Social anxiety disorder  F40.10     6. 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HCC)  G80.9      Plan:  Consider further willingness to challenge sexual habits on grounds of feeling left out, ask for H to confer re sexual expectations rather than simply accede to demands. Similarly, consider addressing his overinterpretation of "choosing" FOO over FOI/him and perceived negativity toward [facility] Open invitation for H to join; same for other family members, at discretion Continue appropriate efforts to shape Casey Diaz's socializing and responsibility to clean up after himself, e.g., "after you ___, then you can ___" Re. perceived loss of close relationship with son, self-affirm that she always wanted to create a "buddy" but any adolescent boy still distances and may go through a sullen, isolative period Other recommendations/advice as may be noted above Continue to utilize previously learned skills ad lib Maintain medication as prescribed and work faithfully with relevant prescriber(s) if any changes are desired or seem indicated Call the clinic on-call service, 988/hotline, 911, or present to [facility] or ER if any life-threatening  psychiatric crisis Return for time as available. Already scheduled visit in this office 07/29/2022.  Blanchie Serve, PhD Casey Moore, PhD LP Clinical Psychologist, [facility] [address], [place], [place] [id number] [phone]

## 2022-07-06 NOTE — Progress Notes (Signed)
Casey CONRY [id number] July 10, 1966 56 y.o.    Subjective:   Patient ID:  Casey Diaz is a 56 y.o. (DOB January 21, 1966) female.  Chief Complaint:  Chief Complaint  Patient presents with   Follow-up   Depression   Anxiety     HPI Casey Diaz presents to the office today for follow-up of OCD and severe anxiety.     December 2019 visit the following was noted: No meds were changed. Lives in [place] and back for followup.  Sx are about the same.  Has to take meds with different sizes. Pt reports that mood is Anxious and Depressed and describes anxiety as Severe. Anxiety symptoms include: Excessive Worry, Obsessive Compulsive Symptoms:   Checking,,. Pt reports has interrupted sleep and nocturia. Pt reports that appetite is good. Pt reports that energy is no change and down slightly. Concentration is down slightly. Suicidal thoughts:  denied by patient. Loves the environment of [place] but misses some things there.  She's not able to work there.  H works there and likes it.  Struggled with not working, feels isolated and not up to task of meeting people.  Does attend a church and met a friend who's been helpful.  Leaving for [place] on 10/16/18.   04/09/2020 appointment the following is noted:  Staying another year in [place] bc Covid and other things. Last few months a lot of crying spells.  Is in menopause. Wonders about med changes though is nervous about it.  Crying spells associated with depressing thoughts more than stress or OCD.   Covid really hard on everyone and couldn't see family for 18 mos.  Family still very dysfunctional. No close friends in part due to OCD and depression. Son high Autism spectrum with ADHD and anxiety and she's with him all the time. Greater health problems with CP so more pains.   05/15/20 appt with the following noted: [name] for menopause and helps some. Still depressed.  Chronically. In [place] for 2 more weeks then to [place] for another  year. A lot of stressors lately triggering more checking and anxiety.   OCD is her CC now and seems.  Got worse DT stress.   Stressed with Asberger's son and her health.  H works a lot.  Her FOO still stress. Plan: Trintellix 10 mg 1 tablet in the morning with food and reduce fluvoxamine to 5 tablets nightly for 1 week  then reduce it to 4 tablets nightly.   07/02/20 appt with the following noted: Decided not to get Trintellix bc difficulty getting it. It is available.  There.  Wants to start it now.   Both depression and OCD are severe.  Not suicidal in intent or plan. Did not take samples with her to [place] but will be back in December. covid is worse there and travel is difficult.  Wants to reduce Wellbutrin DT dry mouth. Plan: She's afraid to reduce Luvox at this time DT fear of worsening OCD.  But will consider. Trintellix 10 mg 1 tablet in the morning with food and reduce fluvoxamine to 5 tablets nightly for 1 week  then reduce it to 4 tablets nightly. Also reduce Wellbutrin XL to 300 mg daily.    9-13 2022 appointment with the following noted: Back in [place] since July 14.  Broke arm a month ago and surgery.  It's all been rough adjustment.   B has cancer on his face and M fell taking him to the doctor.  Misses the water  and weather of [place].   Cry a lot more since menopause. Still depression and anxiety and OCD.  Asks about ketamine. On Wellbutrin 300, Luvox 300.  No Trintellix. Added Ativan 2 mg AM and HS and it helps.  More likely to get upset at night. Plan: Increase Luvox back to 400 mg daily.  She thinks she's worse on less. Continue Wellbutrin XL to 300 mg daily. 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  She tolerated it well without unusual nausea or vomiting headache or other somatic symptoms.  She did have the expected dissociation which gradually resolved over the course  of the 2-hour period of observation.  She was a little concerned about her balance given her cerebral palsy but has not noted unusual or unexpected problems.  She is motivated to can continue Spravato in hopes of reducing her depressive symptoms. 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  She also has treatment resistant OCD which is partially managed with medications but is still quite disabling.  She is tolerating the medications well.  She is sleeping adequately.  Her appetite is adequate.  She is not having suicidal thoughts.  She continues to wish for a better treatment for OCD that would give her some relief.  09/30/2021 appointment with the following noted: She received her first dose of Spravato 84 mg intranasally today.  She tolerated it well without unusual nausea, vomiting, or other somatic symptoms.  Dissociation as expected did occur and gradually resolved over the 2-hour period of observation.  She did have a mild headache today with the treatment and received ibuprofen 600 mg at her request.  We will follow this to see if it is a pattern Patient is still depressed.  She said she was late with her medicine today and today was a particularly depressing day.  However she notes that the Spravato has lifted her mood considerably even today.  She is hopeful that it will continue to be helpful.  No suicidal thoughts.  She has ongoing chronic anxiety and OCD at baseline.  10/04/21 appt noted: Patient received Spravato 84 mg for the second time today.  She tolerated it well without any unusual headache, nausea or vomiting or other somatic symptoms.  Dissociation did occur and she gradually [employer] resolution over the 2-hour period of observation. She did not have any unusual problems after she left the office last Spravato administration.  She did not have any specific problems with balance or walking.  She is at increased risk of that difficulty because of cerebral  palsy.  So far she has not noticed much mood effect from the medication beyond the first day of receiving it.  However she would like to continue Spravato in hopes of getting the antidepressant effect that is desired. Stress dealing with mother's behavior at party pt hosted.  Guilt over it.  10/07/2021 appointment noted: Patient received Spravato 84 mg for the second time today.  She tolerated it well without any unusual headache, nausea or vomiting or other somatic symptoms.  Dissociation did occur and she gradually Kelley resolution over the 2-hour period of observation. She still is not sure about the antidepressant effect of Spravato.  Events over the holidays and demands, make it difficult to assess.  She still notes that the OCD tends to worsen the depression and vice versa.  She tolerates the Spravato well and  wants to continue the trial.  10/15/2021 appointment with the following noted: Patient received Spravato 84 mg for the second time today.  She tolerated it well without any unusual headache, nausea or vomiting or other somatic symptoms.  Dissociation did occur and she gradually [place] resolution over the 2-hour period of observation. Patient says it was somewhat difficult to evaluate the effect of the Spravato.  It was scheduled to be twice weekly for 4 weeks consecutively but the holidays have interfered with that administration.  She asked what specifically should be she should be looking for in order to assess improvement.  That was discussed.  The OCD is unchanged and the depression so far is not significantly different.  She still tolerates meds.  There have been no recent med changes  10/19/2021 appt noted: Patient received Spravato 84 mg for the second today.  She tolerated it well without any unusual headache, nausea or vomiting or other somatic symptoms.  Dissociation did occur and she gradually saw resolution over the 2-hour period of observation.   10/21/2021 appointment noted: Patient  received Spravato 84 mg today.  She tolerated it well without any unusual headache, nausea or vomiting or other somatic symptoms.  Dissociation did occur and she gradually saw resolution over the 2-hour period of observation.  She feels better than last week.  She is not as depressed and down.  She is still dealing with grief around the death of her cousin that was unexpected.  It is still difficult to tell how much the Spravato was doing but she is hopeful.  Anxiety is still present with the OCD.  She is not having suicidal thoughts.  She is not hopeless.  She wants to continue treatment.  10/25/2021 appointment with the following noted: Patient received Spravato 84 mg today.  She tolerated it well without any unusual headache, nausea or vomiting or other somatic symptoms.  Dissociation did occur and she gradually saw resolution over the 2-hour period of observation.  She does not typically find the dissociation very strong. She is beginning to think the Spravato is helping somewhat with the depression.  It has been difficult to tell with the holidays intervening as well as the death of her cousin.  She has not been able to get Spravato twice weekly for 4 weeks straight as typically planned.  However she is hopeful.  The OCD remains significant.  She still has a tendency to think very negatively.  She is not suicidal.  10/28/2021 appointment with the following noted: Patient received Spravato 84 mg today.  She tolerated it well without any unusual headache, nausea or vomiting or other somatic symptoms.  Dissociation did occur and she gradually saw resolution over the 2-hour period of observation.  She does not typically find the dissociation very strong. She is feeling more hopeful about the administration of Spravato.  She is having less depression she believes.  Still not dramatically different.  She still has a tendency to have a lot of anxiety and rumination and OCD.  She is not suicidal.  She is eager to  continue the Spravato.  11/01/2021 appointment with the following noted: Patient received Spravato 84 mg today.  She tolerated it well without any unusual headache, nausea or vomiting or other somatic symptoms.  Dissociation did occur and she gradually saw resolution over the 2-hour period of observation.  She does not typically find the dissociation very strong. She is continuing to see a little bit of improvement in depression with Spravato.  The anxiety remains but may be not as severe.  The OCD remains markedly severe chronically.  She is not suicidal.  She is encouraged by the degree of improvement with Spravato and inability to enjoy things more and not be quite as ruminative.  11/04/2021 appt noted: Patient received Spravato 84 mg today.  She tolerated it well without any unusual headache, nausea or vomiting or other somatic symptoms.  Dissociation did occur and she gradually saw resolution over the 2-hour period of observation.  She does not typically find the dissociation very strong. No SE complaints with meds. She continues to feel hopeful about the Spravato.  She has less depression.  Because of a number of factors she is uncertain of the full benefit but thinks she is somewhat less depressed.  Her anxiety and OCD remain significant but a little better.  She is tolerating the medications and does not desire medicine change.  She is not currently complaining of insomnia.   11/08/2021 appointment the following noted: Patient received Spravato 84 mg today.  She tolerated it well without any unusual headache, nausea or vomiting or other somatic symptoms.  Dissociation did occur and she gradually saw resolution over the 2-hour period of observation.  She does not typically find the dissociation very strong. No SE complaints with meds. She feels the Spravato is helping somewhat.  She would like to see a greater effect.  However she is able to enjoy things.  She is productive at home.  She would like  to see a lifting of a degree of sadness that remains.  The anxiety and OCD remained largely unchanged.  She wondered about the dosing of Wellbutrin 300 mg a day and Luvox 300 mg a day and possible increases.  She has been at higher doses in the past.  She plans to start water therapy for her weakness and for her shoulder.  11/11/2021 appointment with the following noted: Patient received Spravato 84 mg today.  She tolerated it well without any unusual headache, nausea or vomiting or other somatic symptoms.  Dissociation did occur and she gradually saw resolution over the 2-hour period of observation.  She does not typically find the dissociation very strong. No SE complaints with meds. She feels the Spravato is clearly helping the depression.  She would like to see a more significant effect.  She is still having trouble thinking positive. Her energy is fair.  Concentration is good except for the problem with chronic obsessions. She has been taking Wellbutrin 300 mg in Luvox 300 mg for quite some time but has taken higher doses in the past.  We discussed that.  She would like to try higher doses in order to get a better effect if possible. We just increased the doses a couple of days ago.  No effect yet.  11/15/2021 appointment with the following noted: Patient received Spravato 84 mg today.  She tolerated it well without any unusual headache, nausea or vomiting or other somatic symptoms.  Dissociation did occur and she gradually saw resolution over the 2-hour period of observation.  She does not typically find the dissociation very strong. No SE complaints with meds. The patient is now convinced that the Spravato is helping the depression.  She would like to continue twice weekly Spravato this week if possible.  She has tolerated the increase in Wellbutrin to 450 mg daily and the increase and fluvoxamine to 400 mg daily without complications thus far.  The OCD and anxiety feed the depression to  some  extent. She spends approximately 2 hours daily with checking compulsions due to obsessions about causing harm to others.  For example fearing that when she has hit a pot hole that she may have hit a person and going back to check.  Checking corners and rooms out of fear that she may have harmed someone.  Other various checking compulsions.  She is hoping the increase in fluvoxamine to 400 mg will reduce that over the weeks to come.  She is not seeing a significant difference with the addition of the Spravato though she understands that was not expected.  She is more productive at home and more motivated and able to enjoy things more fully as a result of the Spravato treatment.  She is tolerating the medication  11/18/2021 appointment with the following noted: Patient received Spravato 84 mg today.  She tolerated it well without any unusual headache, nausea or vomiting or other somatic symptoms.  Dissociation did occur and she gradually saw resolution over the 2-hour period of observation.  She does not typically find the dissociation very strong. No SE complaints with meds. She clearly believes the Spravato has been helpful for the depression.  She wonders whether to continue to treatments weekly or to cut back to 1 weekly.  She would like to continue twice weekly in hopes of getting additional improvement in the depression because it is not resolved but it is difficult to get here twice a week in terms of arranging rides. She is recently increased Wellbutrin XL to 450 mg daily and fluvoxamine to 400 mg daily but they have not had time to have an official effect.  She is tolerating that well.  She is tolerating meds overwork overall well. The OCD remains the same as noted on 11/15/2021  11/25/21 appt noted: Patient received Spravato 84 mg today.  She tolerated it well without any unusual headache, nausea or vomiting or other somatic symptoms.  Dissociation did occur and she gradually saw resolution over the  2-hour period of observation.  She does not typically find the dissociation very strong. No SE complaints with meds. She thinks the increase in Wellbutrin and Luvox have been potentially helpful for depression and OCD respectively.  It has been too early to see the full effect.  She is sleeping and eating well.  She is functioning at home.  She still spends a lot of time that is about 2 hours a day dealing with compulsive behaviors.  12/02/21 appt noted: Patient received Spravato 84 mg today.  She tolerated it well without any unusual headache, nausea or vomiting or other somatic symptoms.  Dissociation did occur and she gradually saw resolution over the 2-hour period of observation.  She does not typically find the dissociation very strong. No SE complaints with meds. Several losses and stressors recently that affect her sense of mood. However still sees significant benefit from the Spravato for her depression.  Wants to continue it. Suspect early  some benefit from the increased Wellbutrin for depression and Luvox for OCD. Tolerating meds. No complaints about the meds. Sleeping and eating well.  No new health concerns.  12/09/21 appt noted: Patient received Spravato 84 mg today.  She tolerated it well without any unusual headache, nausea or vomiting or other somatic symptoms.  Dissociation did occur and she gradually saw resolution over the 2-hour period of observation.  She does not typically find the dissociation very strong. No SE complaints with meds. Seeing noticeable improvement from increase fluvoxamine to  400 mg daily.  Tolerating meds without concerns over them. Depression is stable with residual sx of easy guilt and easily stressed.  OCD contributes to depression but depression is not severe with less crying spells.  Productive at home with chores.  Enjoyed recent birthday.  Sleeping good. No new concerns.  12/23/2021 appointment noted: Patient received Spravato 84 mg today.  She  tolerated it well without any unusual headache, nausea or vomiting or other somatic symptoms.  Dissociation did occur and she gradually saw resolution over the 2-hour period of observation.  She does not typically find the dissociation very strong. No SE complaints with meds. Seeing noticeable improvement from increase fluvoxamine to 400 mg daily.  Tolerating meds without concerns over them. Her depression is somewhat improved with the Spravato.  She also feels generally a little lighter.  She is more motivated.  She is less overwhelmed by guilt.  The OCD is gradually improving but is still quite time-consuming as noted before.  She is sleeping well.  No side effects  12/30/2021 appointment with the following noted: Patient received Spravato 84 mg today.  She tolerated it well without any unusual headache, nausea or vomiting or other somatic symptoms.  Dissociation did occur and she gradually saw resolution over the 2-hour period of observation.  She does not typically find the dissociation very strong. No SE complaints with meds. Seeing noticeable improvement from increase fluvoxamine to 400 mg daily.  Tolerating meds without concerns over them. She is confident of her the improvement seen with Spravato.  She is less hopeless.  Guilt is marked remarkably improved.  She is not having any thoughts of death or dying.  She is more motivated for activities such as exercise which she is recently started.  She is sleeping well. The OCD remains severe but it is improving somewhat with the increase in fluvoxamine.  It is still consuming a couple hours per day.  01/10/22 apravato 84 admin  01/24/22 appt noted: Patient received Spravato 84 mg today.  She tolerated it well without any unusual headache, nausea or vomiting or other somatic symptoms.  Dissociation did occur and she gradually saw resolution over the 2-hour period of observation.  She does not typically find the dissociation very strong. No SE  complaints with meds. Very tearful today.  Feels like she has been suppressing emotion in the Spravato caused it to be released.  Discussed some stressors.  Overall still feels the medicine is helpful.  She has missed some of the scheduled Spravato treatments that were intended to be weekly due to circumstances beyond her control.  She is still struggling with OCD as previously noted but does believe the medications are helpful. Plan no med changes  01/31/2022 received Spravato 84 mg today  02/09/2022 appointment with the following noted: Patient received Spravato 84 mg today.  She tolerated it well without any unusual headache, nausea or vomiting or other somatic symptoms.  Dissociation did occur and she gradually saw resolution over the 2-hour period of observation.  She does not typically find the dissociation very strong. No SE complaints with meds. Spravato clearly helps depression and OCD but easily gets overwhelmed and tearful with fairly routine stressors.  Tolerating meds. Sleep and appetite is OK Asks to increase lorazepam to 2 mg AM and HS and 46m afternoon  02/16/22 appt noted: Patient received Spravato 84 mg today.  She tolerated it well without any unusual headache, nausea or vomiting or other somatic symptoms.  Dissociation did occur and she  gradually saw resolution over the 2-hour period of observation.  She does not typically find the dissociation very strong. No SE complaints with meds. She has chronic depesssion and OCD but is improved with Spravato, both dx versus before.  She has continued Luvox 400 mg and Wellbutrin 450 mg and is tolerating it.  Chronically easily stressed.  Tolerating all meds.  Doesn't like taking more meds.  Spending a couple hours daily with OCD.  No SI No med changes.  02/21/22 appt noted:   Doesn't like taking more meds.  Spending a couple hours daily with OCD.  No SI No med changes.  02/21/22 appt noted: Patient received Spravato 84 mg today.  She  tolerated it well without any unusual headache, nausea or vomiting or other somatic symptoms.  Dissociation did occur and she gradually saw resolution over the 2-hour period of observation.  She does not typically find the dissociation very strong. No SE complaints with meds. She has chronic depesssion and OCD but is improved with Spravato, both dx versus before.  She has continued Luvox 400 mg and Wellbutrin 450 mg and is tolerating it.  Chronically easily overwhelmed and doesn't know why.  Tolerating all meds. Wants to continue meds.  03/16/22 appt noted: Patient received Spravato 84 mg today.  She tolerated it well without any unusual headache, nausea or vomiting or other somatic symptoms.  Dissociation did occur and she gradually saw resolution over the 2-hour period of observation.  She does not typically find the dissociation very strong. No SE complaints with meds. Overall she still feels the Spravato has been helpful not only for her depression but also for her OCD which was somewhat unexpected.  OCD is still significant but it is less severe than prior to starting Spravato.  She is tolerating Luvox 400 mg and Wellbutrin 450 mg.  We discussed possible med adjustments.  03/23/22 appt noted: Patient received Spravato 84 mg today.  She tolerated it well without any unusual headache, nausea or vomiting or other somatic symptoms.  Dissociation did occur and she gradually saw resolution over the 2-hour period of observation.  She does not typically find the dissociation very strong. No SE complaints with meds. She is still depressed and still has OCD of course but is improved with the Spravato.  She is tolerating the medications well.  We had previously discussed the possibility of switching some of the Wellbutrin to [facility] and she is very interested in that in hopes of further improvement in depression and OCD.  She understands that Auvelity is not used for OCD on the label.  She is tolerating the  medications.  She is still easily overwhelmed.  She is sleeping and eating okay.. Plan: Reduce Wellbutrin XL to 300 mg AM and add Auvelity 1 tablet each AM  03/30/22 appt noted: Patient received Spravato 84 mg today.  She tolerated it well without any unusual headache, nausea or vomiting or other somatic symptoms.  Dissociation did occur and she gradually saw resolution over the 2-hour period of observation.  She does not typically find the dissociation very strong. No SE complaints with meds. She is still depressed and still has OCD of course but is improved with the Spravato.  She is tolerating the medications well.  No difference with Auvelity 1 AM so far and no SE.  Going on vacation on Saturday. 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 Sleep and appetite good. Plan: Increase Auvelity to 1 twice daily and reduce Wellbutrin to XL 150  every morning  04/14/2022 appointment with the following noted: Patient received Spravato 84 mg today.  She tolerated it well without any unusual headache, nausea or vomiting or other somatic symptoms.  Dissociation did occur and she gradually saw resolution over the 2-hour period of observation.  She does not typically find the dissociation very strong. No SE complaints with meds. She is still depressed and still has OCD of course but is improved with the Spravato.  She is tolerating the medications well.  Just increased Auvelity to BID yesterday and reduced Wellbutrin to 150 AM. No SE so far.  No change in mood or anxiety so far.  Chronic OCD as noted and residual depression and chronic fatigue.  04/21/2022 appointment with the following noted: Patient received Spravato 84 mg today.  She tolerated it well without any unusual headache, nausea or vomiting or other somatic symptoms.  Dissociation did occur and she gradually saw resolution over the 2-hour period of observation.  She does not typically find the dissociation very strong. No SE complaints with  meds. She is still depressed and still has OCD of course but is improved with the Spravato.   She has questions about the dosing of lorazepam. She tends to have negative anxious thoughts at night.  This tends to interfere with her ability to go to sleep.  She is getting about 8 to 9 hours of sleep.  She is tolerating the meds without excessive sedation and does not nap during the day.  05/06/22 appt noted: Patient received Spravato 84 mg today.  She tolerated it well without any unusual headache, nausea or vomiting or other somatic symptoms.  Dissociation did occur and she gradually saw resolution over the 2-hour period of observation.  She does not typically find the dissociation very strong. No SE complaints with meds. She is still depressed and still has OCD of course but is improved with the Spravato.   Had some questions about timing of dosing of fluvoxamine and Auvelity. OCD is not quite as time consuming.  Sleep and eating are the same.   No SE meds.  05/25/22 appt noted: Patient received Spravato 84 mg today.  She tolerated it well without any unusual headache, nausea or vomiting or other somatic symptoms.  Dissociation did occur and she gradually saw resolution over the 2-hour period of observation.  She does not typically find the dissociation very strong. No SE complaints with meds. She is still depressed and still has OCD of course but is improved with the Spravato.   She has less OCD when away from home and on vacation of note. Plan: Rec gradually reduce HS lorazepam to 1 mg Hs.  Can continue lorazepam 2 mg AM and 1 mg in afternoon bc of chronic anxiety and it is helpful and tolerated. She can continue temazepam 30 mg nightly.  She tends to have a lot of anxious negative thoughts at night when she is trying to go to bed  06/16/22 appt noted: Patient received Spravato 84 mg today.  She tolerated it well without any unusual headache, nausea or vomiting or other somatic symptoms.   Dissociation did occur and she gradually saw resolution over the 2-hour period of observation.  She does not typically find the dissociation very strong. No SE complaints with meds. She is still depressed and still has OCD of course but is improved with the Spravato.   She has less OCD when away from home and on vacation of note. She is tolerating the medications.  She has continued current medications. 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  She still receives benefit from [place].  Her depression and OCD are better with the Spravato.  She thinks she is getting additional benefit with the switch from Wellbutrin to [place].  07/04/2022 appointment noted: Reports she developed a rash on her face from [facility] and feels like she is allergic to it.  She stopped it and went back to Wellbutrin 450 mg every morning.  The rash has cleared up.  She did not require any medical attention and did not have shortness of breath. Overall her depression and OCD are about the same as they have been.  She did not notice a substantial difference from the brief treatment with Auvelity but she understands she did not take a full course.  She is tolerating the current medicines well. Current meds fluvoxamine 400 mg daily, Wellbutrin XL 450 mg daily, lorazepam 1 to 2 mg in the morning and 1 to 2 mg at night and 1 mg in the afternoon, temazepam 30 mg nightly. She wants to continue the Spravato because she feels it has been helpful for both her depression and her racing OCD  Previous psych med trials include Prozac, paroxetine, sertraline, fluvoxamine, venlafaxine, Anafranil with no response,  Wellbutrin, , Viibryd, Trintellix 10 1 month  NR Geodon,  risperidone, Rexulti, Abilify,  Seroquel, Latuda 40 mg with irritability.  lamotrigine lithium,  BuSpar, Namenda,  pramipexole with no response, and Topamax, pindolol  ECT-MADRS    [place] Office Visit from 06/29/2021 in [place] Total Score 36      Flowsheet Row Admission (Discharged) from 06/11/2021 in [place] No Risk        Review of Systems:  Review of Systems  Constitutional:  Positive for fatigue.  Cardiovascular:  Negative for chest pain and palpitations.  Musculoskeletal:  Positive for arthralgias, back pain and gait problem. Negative for joint swelling.  Neurological:  Positive for weakness. Negative for tremors.  Psychiatric/Behavioral:  Positive for dysphoric mood. Negative for agitation and suicidal ideas. The patient is nervous/anxious.     Medications: I have reviewed the patient's current medications.  Current Outpatient Medications  Medication Sig Dispense Refill   Abaloparatide (TYMLOS) 3120 MCG/1.56ML SOPN Inject into the skin.     Azelastine-Fluticasone 137-50 MCG/ACT SUSP Place 1-2 sprays into both nostrils daily.     baclofen (LIORESAL) 10 MG tablet Take 20 mg by mouth at bedtime as needed for muscle spasms.     buPROPion (WELLBUTRIN XL) 150 MG 24 hr tablet TAKE 3 TABLETS BY MOUTH DAILY (Patient taking differently: Take 450 mg by mouth daily.) 270 tablet 0   dicyclomine (BENTYL) 10 MG capsule Take 10 mg by mouth daily.     docusate sodium (COLACE) 100 MG capsule Take 1 capsule (100 mg total) by mouth 2 (two) times daily. (Patient taking differently: Take 100 mg by mouth daily.) 10 capsule 0   Esketamine HCl, 84 MG Dose, (SPRAVATO, 84 MG DOSE,) 28 MG/DEVICE SOPK USE 3 SPRAYS IN EACH NOSTRIL ONCE A WEEK 3 each 3   fexofenadine (ALLEGRA) 180 MG tablet Take 180  mg by mouth daily.     fluvoxaMINE (LUVOX) 100 MG tablet Take one tablet (100 mg) by mouth every morning and 3  tablets (300 mg) at bedtime. 120 tablet 5   hydrocortisone (ANUSOL-HC) 2.5 % rectal cream Place rectally 2 (two) times daily. x 7-14 days 30 g 0   ketotifen (ZADITOR) 0.025 % ophthalmic solution Place 3 drops into both eyes at bedtime.     LORazepam (ATIVAN) 1 MG tablet TAKE 1-2 IN THE AM AND 1-2 TABLETS EVERY NIGHT AT BEDTIME AND 1 TABLET IN AFTERNOON when needed for anxiety and sleep 150 tablet 1   magnesium gluconate (MAGONATE) 500 MG tablet Take 500 mg by mouth daily.     MIBELAS 24 FE 1-20 MG-MCG(24) CHEW Chew 1 tablet by mouth at bedtime as needed (bowel regularity).     Multiple Vitamins-Minerals (ADULT GUMMY PO) Take 2 tablets by mouth in the morning.     nitrofurantoin (MACRODANTIN) 100 MG capsule Take 100 mg by mouth as needed (For urinary tract infection.).      oxyCODONE-acetaminophen (PERCOCET/ROXICET) 5-325 MG tablet Take 1-2 tablets by mouth every 6 (six) hours as needed for severe pain. 50 tablet 0   polyethylene glycol (MIRALAX / GLYCOLAX) packet Take 17 g by mouth daily as needed for mild constipation. 14 each 0   psyllium (METAMUCIL) 58.6 % powder Take 1 packet by mouth daily as needed (constipation).     temazepam (RESTORIL) 30 MG capsule TAKE 1 CAPSULE BY MOUTH AT BEDTIME AS NEEDED FOR SLEEP. 30 capsule 1   Vitamin D-Vitamin K (VITAMIN K2-VITAMIN D3 PO) Take 1-2 sprays by mouth daily.     Dextromethorphan-buPROPion ER (AUVELITY) 45-105 MG TBCR Take 1 tablet by mouth 2 (two) times daily. (Patient not taking: Reported on 07/06/2022)     No current facility-administered medications for this visit.    Medication Side Effects: None   Allergies:  Allergies  Allergen Reactions   Hydrocodone Itching   Sulfamethoxazole-Trimethoprim Itching   Dust Mite Extract Other (See Comments)    Sneezing, watery eyes, runny nose   Latex Itching   Other Other (See Comments)    PT IS ALLERGIC TO CAT DANDER AND RAGWEED - Sneezing, watery eyes, runny nose    Pollen Extract Other (See  Comments)    Sneezing, watery eyes, runny nose     Past Medical History:  Diagnosis Date   Abnormal Pap smear 2011   hpv/mild dysplasia,cin1   Anxiety    Cerebral palsy (HCC)    right arm/leg   Cystocele    Depression    Headache    Neuromuscular disorder (HCC)    Cerebral Palsy   OCD (obsessive compulsive disorder)    Osteoporosis    Uterine prolaps     Family History  Problem Relation Age of Onset   Cancer Father        skin AND LUNG   Alcohol abuse Sister        CRACK COCAINE    Social History   Socioeconomic History   Marital status: Married    Spouse name: Not on file   Number of children: Not on file   Years of education: Not on file   Highest education level: Not on file  Occupational History   Not on file  Tobacco Use   Smoking status: Never   Smokeless tobacco: Never  Substance and Sexual Activity   Alcohol use: Not Currently    Comment: OCCASIONAL beer   Drug use: No  Sexual activity: Yes    Birth control/protection: Pill    Comment: LOESTRIN 24 FE  Other Topics Concern   Not on file  Social History Narrative   Not on file   Social Determinants of Health   Financial Resource Strain: Not on file  Food Insecurity: Not on file  Transportation Needs: Not on file  Physical Activity: Not on file  Stress: Not on file  Social Connections: Not on file  Intimate Partner Violence: Not on file    Past Medical History, Surgical history, Social history, and Family history were reviewed and updated as appropriate.   Please see review of systems for further details on the patient's review from today.   Objective:   Physical Exam:  LMP  (LMP Unknown)   Physical Exam Constitutional:      General: She is not in acute distress. Neurological:     Mental Status: She is alert and oriented to person, place, and time.     Cranial Nerves: No dysarthria.     Motor: Weakness present.     Gait: Gait abnormal.  Psychiatric:        Attention and  Perception: Attention and perception normal.        Mood and Affect: Mood is anxious and depressed. Affect is not labile.        Speech: Speech normal. Speech is not slurred.        Behavior: Behavior normal. Behavior is not slowed. Behavior is cooperative.        Thought Content: Thought content normal. Thought content is not delusional. Thought content does not include homicidal or suicidal ideation. Thought content does not include suicidal plan.        Cognition and Memory: Cognition and memory normal. Cognition is not impaired.        Judgment: Judgment normal.     Comments: Insight intact Pleasant. Ongoing OCD remains fairly severe but less anxious Checking compulsions up to 2 hours daily but improved noticeably Chronic depression persistent but better with Spravato Still can be easily upset      Lab Review:     Component Value Date/Time   NA 138 06/11/2021 0606   K 4.0 06/11/2021 0606   CL 107 06/11/2021 0606   CO2 26 06/11/2021 0606   GLUCOSE 90 06/11/2021 0606   BUN 18 06/11/2021 0606   CREATININE 0.81 06/11/2021 0606   CALCIUM 9.4 06/11/2021 0606   PROT 6.5 06/11/2021 0606   ALBUMIN 3.3 (L) 06/11/2021 0606   AST 17 06/11/2021 0606   ALT 14 06/11/2021 0606   ALKPHOS 141 (H) 06/11/2021 0606   BILITOT 0.2 (L) 06/11/2021 0606   GFRNONAA >60 06/11/2021 0606   GFRAA >60 07/09/2016 0438       Component Value Date/Time   WBC 5.8 06/11/2021 0606   RBC 4.12 06/11/2021 0606   HGB 12.5 06/11/2021 0606   HCT 39.7 06/11/2021 0606   PLT 299 06/11/2021 0606   MCV 96.4 06/11/2021 0606   MCH 30.3 06/11/2021 0606   MCHC 31.5 06/11/2021 0606   RDW 13.9 06/11/2021 0606   LYMPHSABS 1.9 06/11/2021 0606   MONOABS 0.5 06/11/2021 0606   EOSABS 0.1 06/11/2021 0606   BASOSABS 0.0 06/11/2021 0606    No results found for: "POCLITH", "LITHIUM"   No results found for: "PHENYTOIN", "PHENOBARB", "VALPROATE", "CBMZ"   .res Assessment: Plan:    Mieshia was seen today for follow-up,  depression and anxiety.  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  Impaired function but less so with Spravato re: depression..   She is receiving Spravato 84 mg weekly and marked improvement in the depression..  she feels it also helps OCD somewhat.  However still easily overwhelmed with low stress tolerance.  The OCD is improved with the increase in fluvoxamine and with Spravato.  Spends 2 hours daily and checking compulsions on her worst days but better when she travels.  She has been on higher doses of fluvoxamine above the usual max of 400 mg daily in the past.  This became difficult to obtain at 1 point and the dose was reduced to 300 mg daily.   Disc SE. She would like to continue Luvox 400 mg daily again    She is tolerating the meds well  Continue Increased Luvox back to 400 mg nightly as of January 2023. Disc dosing higher than usual.  She feels this is increase has helped more with OCD which remains chronically severe.  She now feels that she is allergic to Auvelity.  It was explained to her that Auvelity contains bupropion and dextromethorphan and she could be allergic to either one of the ingredients but her rash has improved off the Auvelity. Per her request continue Wellbutrin XL 450 mg every morning.  Disc Spravato DT TRD incl details and SE. Disc dosing and duration.  Pt with severe depression MADRS 36 on 06/29/21  Patient was administered Spravato 84 mg intranasally dosage today.  The patient experienced the typical dissociation which gradually resolved over the 2-hour period of observation.  There were no complications.  Specifically the patient did not have nausea or vomiting or headache.  Blood pressures remained within  normal ranges at the 40-minute and 2-hour follow-up intervals.  By the time the 2-hour observation period was met the patient was alert and oriented and able to exit without assistance. She tends to have lingering sedative effects but not severe. .  See nursing note for further details. Per protocol will continue Spravato to 84 mg weekly.  She has been okay since cutting back to once weekly.  We discussed the short-term risks associated with benzodiazepines including sedation and increased fall risk among others.  Discussed long-term side effect risk including dependence, potential withdrawal symptoms, and the potential eventual dose-related risk of dementia.  But recent studies from 2020 dispute this association between benzodiazepines and dementia risk. Newer studies in 2020 do not support an association with dementia. Disc this is high dose and not ideal.  Also disc risk combining it with temazepam. Rec gradually reduce HS lorazepam to 1 mg Hs.  Can continue lorazepam 2 mg AM and 1 mg in afternoon bc of chronic anxiety and it is helpful and tolerated. She can continue temazepam 30 mg nightly.  She tends to have a lot of anxious negative thoughts at night when she is trying to go to bed.  She is trying to reduce the dose.  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  Supportive therapy dealing with some of the recent stressors including family stressors and her own health related to cerebral palsy   Follow-up weekly .    Lynder Parents, MD, DFAPA  Please see After Visit Summary for patient specific instructions.  Future Appointments  Date Time Provider [place]  07/29/2022 11:00 AM Blanchie Serve, PhD CP-CP None  08/16/2022 11:00 AM Blanchie Serve, PhD CP-CP None  08/30/2022 11:00 AM Blanchie Serve, PhD CP-CP None  09/13/2022 11:00 AM Blanchie Serve, PhD CP-CP None    No orders of the defined types were placed in this  encounter.    -------------------------------

## 2022-07-08 ENCOUNTER — Ambulatory Visit (INDEPENDENT_AMBULATORY_CARE_PROVIDER_SITE_OTHER): Payer: 59 | Admitting: Psychiatry

## 2022-07-08 DIAGNOSIS — F422 Mixed obsessional thoughts and acts: Secondary | ICD-10-CM

## 2022-07-08 DIAGNOSIS — Z638 Other specified problems related to primary support group: Secondary | ICD-10-CM

## 2022-07-08 DIAGNOSIS — F339 Major depressive disorder, recurrent, unspecified: Secondary | ICD-10-CM

## 2022-07-08 DIAGNOSIS — F4010 Social phobia, unspecified: Secondary | ICD-10-CM | POA: Diagnosis not present

## 2022-07-08 NOTE — Progress Notes (Signed)
Psychotherapy Progress Note [facility] Luan Moore, PhD LP  Patient ID: Casey Diaz (Casey Diaz "Casey Diaz")    MRN: [id number] Therapy format: Individual psychotherapy Date: 07/08/2022      Start: 1:08p     Stop: 1:55p     Time Spent: 47 min Location: In-person   Session narrative (presenting needs, interim history, self-report of stressors and symptoms, applications of prior therapy, status changes, and interventions made in session) Scheduled urgently after being very dysphoric yesterday first thing.  Every day is a struggle, yesterday the day after delicate subjects, but what she has to say about it is that she doesn't just wake up energized and optimistic -- as if she is supposed to.  Support offered.  One good development last night with Casey Diaz getting out to socialize with coworkers, Investment banker, operational.  Seemed to be a lift for him, which bodes well for other issues.  Thinking she might like a dog.  Misses her cat, Casey Diaz, who died last 2023/09/15, just 3 months after coming back to the [place].  After some meandering, comes back to how she wants to figure out how to rise above depression.  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  Affirmed all as actions taken against depression, al of which can help some, just still best to catch the tendency to agonize and discount efforts.  Meanwhile, still will need to better sort out family relationships and boundaries, and better assert her wishes about intimacy.  Therapeutic modalities: Cognitive Behavioral Therapy, Solution-Oriented/Positive Psychology, and Assertiveness/Communication  Mental Status/Observations:  Appearance:   Casual     Behavior:  Appropriate  Motor:  Normal   Speech/Language:   Clear and Coherent  Affect:  Appropriate  Mood:  depressed  Thought process:  normal  Thought content:    Obsessions  Sensory/Perceptual disturbances:    WNL  Orientation:  Fully oriented  Attention:  Good    Concentration:  Fair  Memory:  WNL  Insight:    Good  Judgment:   Good  Impulse Control:  Good   Risk Assessment: Danger to Self: No Self-injurious Behavior: No Danger to Others: No Physical Aggression / Violence: No Duty to Warn: No Access to Firearms a concern: No  Assessment of progress:  stabilized  Diagnosis:   ICD-10-CM   1. Recurrent major depression resistant to treatment ([place])  F33.9     2. Relationship problem with family member  Z63.8     3. Social anxiety disorder  F40.10     4. Mixed obsessional thoughts and acts  F42.2      Plan:  Continue efforts to engage exercise, pat-time work, and supportive relationship outside the home Consider further willingness to challenge sexual habits on grounds of feeling left out, ask for Casey Diaz to confer re sexual expectations rather than simply accede to demands. Similarly, consider addressing his overinterpretation of "choosing" Casey Diaz over Casey Diaz/him and perceived negativity toward Casey Diaz Open invitation for Casey Diaz to join; same for other family members, at discretion Continue appropriate efforts to shape Casey Diaz's socializing and responsibility to clean up after himself, e.g., "after you ___, then you can ___" Re. perceived loss of close relationship with son, self-affirm that she always wanted to create a "buddy" but any  adolescent boy still distances and may go through a sullen, isolative period Other recommendations/advice as may be noted above Continue to utilize previously learned skills ad lib Maintain medication as prescribed and work faithfully with relevant prescriber(s) if any changes are desired or seem indicated Call the clinic on-call service, 988/hotline, 911, or present to Casey Diaz or ER if any  life-threatening psychiatric crisis Return for time as available. Already scheduled visit in this office 07/29/2022.  Casey Diaz Serve, PhD Luan Moore, PhD LP Clinical Psychologist, [facility] [address] [place], [place] [address] [phone]

## 2022-07-18 ENCOUNTER — Ambulatory Visit (INDEPENDENT_AMBULATORY_CARE_PROVIDER_SITE_OTHER): Payer: 59 | Admitting: Psychiatry

## 2022-07-18 ENCOUNTER — Encounter: Payer: Self-pay | Admitting: Psychiatry

## 2022-07-18 ENCOUNTER — Ambulatory Visit: Payer: 59

## 2022-07-18 VITALS — BP 116/77 | HR 85

## 2022-07-18 DIAGNOSIS — F422 Mixed obsessional thoughts and acts: Secondary | ICD-10-CM

## 2022-07-18 DIAGNOSIS — F339 Major depressive disorder, recurrent, unspecified: Secondary | ICD-10-CM

## 2022-07-18 DIAGNOSIS — F5105 Insomnia due to other mental disorder: Secondary | ICD-10-CM | POA: Diagnosis not present

## 2022-07-18 NOTE — Progress Notes (Signed)
[name] [id number] 09-Jan-1966 56 y.o.    Subjective:   Patient ID:  Casey Diaz is a 56 y.o. (DOB Dec 07, 1965) female.  Chief Complaint:  Chief Complaint  Patient presents with   Follow-up   Depression   Anxiety   Fatigue     HPI Casey Diaz presents to the office today for follow-up of OCD and severe anxiety.     December 2019 visit the following was noted: No meds were changed. Lives in [place] and back for followup.  Sx are about the same.  Has to take meds with different sizes. Pt reports that mood is Anxious and Depressed and describes anxiety as Severe. Anxiety symptoms include: Excessive Worry, Obsessive Compulsive Symptoms:   Checking,,. Pt reports has interrupted sleep and nocturia. Pt reports that appetite is good. Pt reports that energy is no change and down slightly. Concentration is down slightly. Suicidal thoughts:  denied by patient. Loves the environment of [place] but misses some things there.  She's not able to work there.  H works there and likes it.  Struggled with not working, feels isolated and not up to task of meeting people.  Does attend a church and met a friend who's been helpful.  Leaving for [place] on 10/16/18.   04/09/2020 appointment the following is noted:  Staying another year in [place] bc Covid and other things. Last few months a lot of crying spells.  Is in menopause. Wonders about med changes though is nervous about it.  Crying spells associated with depressing thoughts more than stress or OCD.   Covid really hard on everyone and couldn't see family for 18 mos.  Family still very dysfunctional. No close friends in part due to OCD and depression. Son high Autism spectrum with ADHD and anxiety and she's with him all the time. Greater health problems with CP so more pains.   05/15/20 appt with the following noted: [name] for menopause and helps some. Still depressed.  Chronically. In [place] for 2 more weeks then to [place] for  another year. A lot of stressors lately triggering more checking and anxiety.   OCD is her CC now and seems.  Got worse DT stress.   Stressed with Asberger's son and her health.  H works a lot.  Her FOO still stress. Plan: Trintellix 10 mg 1 tablet in the morning with food and reduce fluvoxamine to 5 tablets nightly for 1 week  then reduce it to 4 tablets nightly.   07/02/20 appt with the following noted: Decided not to get Trintellix bc difficulty getting it. It is available.  There.  Wants to start it now.   Both depression and OCD are severe.  Not suicidal in intent or plan. Did not take samples with her to [place] but will be back in December. covid is worse there and travel is difficult.  Wants to reduce Wellbutrin DT dry mouth. Plan: She's afraid to reduce Luvox at this time DT fear of worsening OCD.  But will consider. Trintellix 10 mg 1 tablet in the morning with food and reduce fluvoxamine to 5 tablets nightly for 1 week  then reduce it to 4 tablets nightly. Also reduce Wellbutrin XL to 300 mg daily.    9-13 2022 appointment with the following noted: Back in [place] since July 14.  Broke arm a month ago and surgery.  It's all been rough adjustment.   B has cancer on his face and M fell taking him to the doctor.  Misses the water and weather of [place].   Cry a lot more since menopause. Still depression and anxiety and OCD.  Asks about ketamine. On Wellbutrin 300, Luvox 300.  No Trintellix. Added Ativan 2 mg AM and HS and it helps.  More likely to get upset at night. Plan: Increase Luvox back to 400 mg daily.  She thinks she's worse on less. Continue Wellbutrin XL to 300 mg daily. 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  She tolerated it well without unusual nausea or vomiting headache or other somatic symptoms.  She did have the expected dissociation which gradually resolved over  the course of the 2-hour period of observation.  She was a little concerned about her balance given her cerebral palsy but has not noted unusual or unexpected problems.  She is motivated to can continue Spravato in hopes of reducing her depressive symptoms. 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  She also has treatment resistant OCD which is partially managed with medications but is still quite disabling.  She is tolerating the medications well.  She is sleeping adequately.  Her appetite is adequate.  She is not having suicidal thoughts.  She continues to wish for a better treatment for OCD that would give her some relief.  09/30/2021 appointment with the following noted: She received her first dose of Spravato 84 mg intranasally today.  She tolerated it well without unusual nausea, vomiting, or other somatic symptoms.  Dissociation as expected did occur and gradually resolved over the 2-hour period of observation.  She did have a mild headache today with the treatment and received ibuprofen 600 mg at her request.  We will follow this to see if it is a pattern Patient is still depressed.  She said she was late with her medicine today and today was a particularly depressing day.  However she notes that the Spravato has lifted her mood considerably even today.  She is hopeful that it will continue to be helpful.  No suicidal thoughts.  She has ongoing chronic anxiety and OCD at baseline.  10/04/21 appt noted: Patient received Spravato 84 mg for the second time today.  She tolerated it well without any unusual headache, nausea or vomiting or other somatic symptoms.  Dissociation did occur and she gradually [place] resolution over the 2-hour period of observation. She did not have any unusual problems after she left the office last Spravato administration.  She did not have any specific problems with balance or walking.  She is at increased risk of that difficulty because of  cerebral palsy.  So far she has not noticed much mood effect from the medication beyond the first day of receiving it.  However she would like to continue Spravato in hopes of getting the antidepressant effect that is desired. Stress dealing with mother's behavior at party pt hosted.  Guilt over it.  10/07/2021 appointment noted: Patient received Spravato 84 mg for the second time today.  She tolerated it well without any unusual headache, nausea or vomiting or other somatic symptoms.  Dissociation did occur and she gradually [place] resolution over the 2-hour period of observation. She still is not sure about the antidepressant effect of Spravato.  Events over the holidays and demands, make it difficult to assess.  She still notes that the OCD tends to worsen the depression and vice versa.  She tolerates the  Spravato well and wants to continue the trial.  10/15/2021 appointment with the following noted: Patient received Spravato 84 mg for the second time today.  She tolerated it well without any unusual headache, nausea or vomiting or other somatic symptoms.  Dissociation did occur and she gradually [place] resolution over the 2-hour period of observation. Patient says it was somewhat difficult to evaluate the effect of the Spravato.  It was scheduled to be twice weekly for 4 weeks consecutively but the holidays have interfered with that administration.  She asked what specifically should be she should be looking for in order to assess improvement.  That was discussed.  The OCD is unchanged and the depression so far is not significantly different.  She still tolerates meds.  There have been no recent med changes  10/19/2021 appt noted: Patient received Spravato 84 mg for the second today.  She tolerated it well without any unusual headache, nausea or vomiting or other somatic symptoms.  Dissociation did occur and she gradually saw resolution over the 2-hour period of observation.   10/21/2021 appointment  noted: Patient received Spravato 84 mg today.  She tolerated it well without any unusual headache, nausea or vomiting or other somatic symptoms.  Dissociation did occur and she gradually saw resolution over the 2-hour period of observation.  She feels better than last week.  She is not as depressed and down.  She is still dealing with grief around the death of her cousin that was unexpected.  It is still difficult to tell how much the Spravato was doing but she is hopeful.  Anxiety is still present with the OCD.  She is not having suicidal thoughts.  She is not hopeless.  She wants to continue treatment.  10/25/2021 appointment with the following noted: Patient received Spravato 84 mg today.  She tolerated it well without any unusual headache, nausea or vomiting or other somatic symptoms.  Dissociation did occur and she gradually saw resolution over the 2-hour period of observation.  She does not typically find the dissociation very strong. She is beginning to think the Spravato is helping somewhat with the depression.  It has been difficult to tell with the holidays intervening as well as the death of her cousin.  She has not been able to get Spravato twice weekly for 4 weeks straight as typically planned.  However she is hopeful.  The OCD remains significant.  She still has a tendency to think very negatively.  She is not suicidal.  10/28/2021 appointment with the following noted: Patient received Spravato 84 mg today.  She tolerated it well without any unusual headache, nausea or vomiting or other somatic symptoms.  Dissociation did occur and she gradually saw resolution over the 2-hour period of observation.  She does not typically find the dissociation very strong. She is feeling more hopeful about the administration of Spravato.  She is having less depression she believes.  Still not dramatically different.  She still has a tendency to have a lot of anxiety and rumination and OCD.  She is not suicidal.   She is eager to continue the Spravato.  11/01/2021 appointment with the following noted: Patient received Spravato 84 mg today.  She tolerated it well without any unusual headache, nausea or vomiting or other somatic symptoms.  Dissociation did occur and she gradually saw resolution over the 2-hour period of observation.  She does not typically find the dissociation very strong. She is continuing to see a little bit of improvement in depression  with Spravato.  The anxiety remains but may be not as severe.  The OCD remains markedly severe chronically.  She is not suicidal.  She is encouraged by the degree of improvement with Spravato and inability to enjoy things more and not be quite as ruminative.  11/04/2021 appt noted: Patient received Spravato 84 mg today.  She tolerated it well without any unusual headache, nausea or vomiting or other somatic symptoms.  Dissociation did occur and she gradually saw resolution over the 2-hour period of observation.  She does not typically find the dissociation very strong. No SE complaints with meds. She continues to feel hopeful about the Spravato.  She has less depression.  Because of a number of factors she is uncertain of the full benefit but thinks she is somewhat less depressed.  Her anxiety and OCD remain significant but a little better.  She is tolerating the medications and does not desire medicine change.  She is not currently complaining of insomnia.   11/08/2021 appointment the following noted: Patient received Spravato 84 mg today.  She tolerated it well without any unusual headache, nausea or vomiting or other somatic symptoms.  Dissociation did occur and she gradually saw resolution over the 2-hour period of observation.  She does not typically find the dissociation very strong. No SE complaints with meds. She feels the Spravato is helping somewhat.  She would like to see a greater effect.  However she is able to enjoy things.  She is productive at home.   She would like to see a lifting of a degree of sadness that remains.  The anxiety and OCD remained largely unchanged.  She wondered about the dosing of Wellbutrin 300 mg a day and Luvox 300 mg a day and possible increases.  She has been at higher doses in the past.  She plans to start water therapy for her weakness and for her shoulder.  11/11/2021 appointment with the following noted: Patient received Spravato 84 mg today.  She tolerated it well without any unusual headache, nausea or vomiting or other somatic symptoms.  Dissociation did occur and she gradually saw resolution over the 2-hour period of observation.  She does not typically find the dissociation very strong. No SE complaints with meds. She feels the Spravato is clearly helping the depression.  She would like to see a more significant effect.  She is still having trouble thinking positive. Her energy is fair.  Concentration is good except for the problem with chronic obsessions. She has been taking Wellbutrin 300 mg in Luvox 300 mg for quite some time but has taken higher doses in the past.  We discussed that.  She would like to try higher doses in order to get a better effect if possible. We just increased the doses a couple of days ago.  No effect yet.  11/15/2021 appointment with the following noted: Patient received Spravato 84 mg today.  She tolerated it well without any unusual headache, nausea or vomiting or other somatic symptoms.  Dissociation did occur and she gradually saw resolution over the 2-hour period of observation.  She does not typically find the dissociation very strong. No SE complaints with meds. The patient is now convinced that the Spravato is helping the depression.  She would like to continue twice weekly Spravato this week if possible.  She has tolerated the increase in Wellbutrin to 450 mg daily and the increase and fluvoxamine to 400 mg daily without complications thus far.  The OCD and anxiety feed  the  depression to some extent. She spends approximately 2 hours daily with checking compulsions due to obsessions about causing harm to others.  For example fearing that when she has hit a pot hole that she may have hit a person and going back to check.  Checking corners and rooms out of fear that she may have harmed someone.  Other various checking compulsions.  She is hoping the increase in fluvoxamine to 400 mg will reduce that over the weeks to come.  She is not seeing a significant difference with the addition of the Spravato though she understands that was not expected.  She is more productive at home and more motivated and able to enjoy things more fully as a result of the Spravato treatment.  She is tolerating the medication  11/18/2021 appointment with the following noted: Patient received Spravato 84 mg today.  She tolerated it well without any unusual headache, nausea or vomiting or other somatic symptoms.  Dissociation did occur and she gradually saw resolution over the 2-hour period of observation.  She does not typically find the dissociation very strong. No SE complaints with meds. She clearly believes the Spravato has been helpful for the depression.  She wonders whether to continue to treatments weekly or to cut back to 1 weekly.  She would like to continue twice weekly in hopes of getting additional improvement in the depression because it is not resolved but it is difficult to get here twice a week in terms of arranging rides. She is recently increased Wellbutrin XL to 450 mg daily and fluvoxamine to 400 mg daily but they have not had time to have an official effect.  She is tolerating that well.  She is tolerating meds overwork overall well. The OCD remains the same as noted on 11/15/2021  11/25/21 appt noted: Patient received Spravato 84 mg today.  She tolerated it well without any unusual headache, nausea or vomiting or other somatic symptoms.  Dissociation did occur and she gradually saw  resolution over the 2-hour period of observation.  She does not typically find the dissociation very strong. No SE complaints with meds. She thinks the increase in Wellbutrin and Luvox have been potentially helpful for depression and OCD respectively.  It has been too early to see the full effect.  She is sleeping and eating well.  She is functioning at home.  She still spends a lot of time that is about 2 hours a day dealing with compulsive behaviors.  12/02/21 appt noted: Patient received Spravato 84 mg today.  She tolerated it well without any unusual headache, nausea or vomiting or other somatic symptoms.  Dissociation did occur and she gradually saw resolution over the 2-hour period of observation.  She does not typically find the dissociation very strong. No SE complaints with meds. Several losses and stressors recently that affect her sense of mood. However still sees significant benefit from the Spravato for her depression.  Wants to continue it. Suspect early  some benefit from the increased Wellbutrin for depression and Luvox for OCD. Tolerating meds. No complaints about the meds. Sleeping and eating well.  No new health concerns.  12/09/21 appt noted: Patient received Spravato 84 mg today.  She tolerated it well without any unusual headache, nausea or vomiting or other somatic symptoms.  Dissociation did occur and she gradually saw resolution over the 2-hour period of observation.  She does not typically find the dissociation very strong. No SE complaints with meds. Seeing noticeable improvement from  increase fluvoxamine to 400 mg daily.  Tolerating meds without concerns over them. Depression is stable with residual sx of easy guilt and easily stressed.  OCD contributes to depression but depression is not severe with less crying spells.  Productive at home with chores.  Enjoyed recent birthday.  Sleeping good. No new concerns.  12/23/2021 appointment noted: Patient received Spravato 84 mg  today.  She tolerated it well without any unusual headache, nausea or vomiting or other somatic symptoms.  Dissociation did occur and she gradually saw resolution over the 2-hour period of observation.  She does not typically find the dissociation very strong. No SE complaints with meds. Seeing noticeable improvement from increase fluvoxamine to 400 mg daily.  Tolerating meds without concerns over them. Her depression is somewhat improved with the Spravato.  She also feels generally a little lighter.  She is more motivated.  She is less overwhelmed by guilt.  The OCD is gradually improving but is still quite time-consuming as noted before.  She is sleeping well.  No side effects  12/30/2021 appointment with the following noted: Patient received Spravato 84 mg today.  She tolerated it well without any unusual headache, nausea or vomiting or other somatic symptoms.  Dissociation did occur and she gradually saw resolution over the 2-hour period of observation.  She does not typically find the dissociation very strong. No SE complaints with meds. Seeing noticeable improvement from increase fluvoxamine to 400 mg daily.  Tolerating meds without concerns over them. She is confident of her the improvement seen with Spravato.  She is less hopeless.  Guilt is marked remarkably improved.  She is not having any thoughts of death or dying.  She is more motivated for activities such as exercise which she is recently started.  She is sleeping well. The OCD remains severe but it is improving somewhat with the increase in fluvoxamine.  It is still consuming a couple hours per day.  01/10/22 apravato 84 admin  01/24/22 appt noted: Patient received Spravato 84 mg today.  She tolerated it well without any unusual headache, nausea or vomiting or other somatic symptoms.  Dissociation did occur and she gradually saw resolution over the 2-hour period of observation.  She does not typically find the dissociation very strong. No  SE complaints with meds. Very tearful today.  Feels like she has been suppressing emotion in the Spravato caused it to be released.  Discussed some stressors.  Overall still feels the medicine is helpful.  She has missed some of the scheduled Spravato treatments that were intended to be weekly due to circumstances beyond her control.  She is still struggling with OCD as previously noted but does believe the medications are helpful. Plan no med changes  01/31/2022 received Spravato 84 mg today  02/09/2022 appointment with the following noted: Patient received Spravato 84 mg today.  She tolerated it well without any unusual headache, nausea or vomiting or other somatic symptoms.  Dissociation did occur and she gradually saw resolution over the 2-hour period of observation.  She does not typically find the dissociation very strong. No SE complaints with meds. Spravato clearly helps depression and OCD but easily gets overwhelmed and tearful with fairly routine stressors.  Tolerating meds. Sleep and appetite is OK Asks to increase lorazepam to 2 mg AM and HS and 67m afternoon  02/16/22 appt noted: Patient received Spravato 84 mg today.  She tolerated it well without any unusual headache, nausea or vomiting or other somatic symptoms.  Dissociation did  occur and she gradually saw resolution over the 2-hour period of observation.  She does not typically find the dissociation very strong. No SE complaints with meds. She has chronic depesssion and OCD but is improved with Spravato, both dx versus before.  She has continued Luvox 400 mg and Wellbutrin 450 mg and is tolerating it.  Chronically easily stressed.  Tolerating all meds.  Doesn't like taking more meds.  Spending a couple hours daily with OCD.  No SI No med changes.  02/21/22 appt noted:   Doesn't like taking more meds.  Spending a couple hours daily with OCD.  No SI No med changes.  02/21/22 appt noted: Patient received Spravato 84 mg today.  She  tolerated it well without any unusual headache, nausea or vomiting or other somatic symptoms.  Dissociation did occur and she gradually saw resolution over the 2-hour period of observation.  She does not typically find the dissociation very strong. No SE complaints with meds. She has chronic depesssion and OCD but is improved with Spravato, both dx versus before.  She has continued Luvox 400 mg and Wellbutrin 450 mg and is tolerating it.  Chronically easily overwhelmed and doesn't know why.  Tolerating all meds. Wants to continue meds.  03/16/22 appt noted: Patient received Spravato 84 mg today.  She tolerated it well without any unusual headache, nausea or vomiting or other somatic symptoms.  Dissociation did occur and she gradually saw resolution over the 2-hour period of observation.  She does not typically find the dissociation very strong. No SE complaints with meds. Overall she still feels the Spravato has been helpful not only for her depression but also for her OCD which was somewhat unexpected.  OCD is still significant but it is less severe than prior to starting Spravato.  She is tolerating Luvox 400 mg and Wellbutrin 450 mg.  We discussed possible med adjustments.  03/23/22 appt noted: Patient received Spravato 84 mg today.  She tolerated it well without any unusual headache, nausea or vomiting or other somatic symptoms.  Dissociation did occur and she gradually saw resolution over the 2-hour period of observation.  She does not typically find the dissociation very strong. No SE complaints with meds. She is still depressed and still has OCD of course but is improved with the Spravato.  She is tolerating the medications well.  We had previously discussed the possibility of switching some of the Wellbutrin to [name] and she is very interested in that in hopes of further improvement in depression and OCD.  She understands that Auvelity is not used for OCD on the label.  She is tolerating the  medications.  She is still easily overwhelmed.  She is sleeping and eating okay.. Plan: Reduce Wellbutrin XL to 300 mg AM and add Auvelity 1 tablet each AM  03/30/22 appt noted: Patient received Spravato 84 mg today.  She tolerated it well without any unusual headache, nausea or vomiting or other somatic symptoms.  Dissociation did occur and she gradually saw resolution over the 2-hour period of observation.  She does not typically find the dissociation very strong. No SE complaints with meds. She is still depressed and still has OCD of course but is improved with the Spravato.  She is tolerating the medications well.  No difference with Auvelity 1 AM so far and no SE.  Going on vacation on Saturday. 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 Sleep and appetite good. Plan: Increase Auvelity to 1 twice daily and reduce Wellbutrin  to XL 150 every morning  04/14/2022 appointment with the following noted: Patient received Spravato 84 mg today.  She tolerated it well without any unusual headache, nausea or vomiting or other somatic symptoms.  Dissociation did occur and she gradually saw resolution over the 2-hour period of observation.  She does not typically find the dissociation very strong. No SE complaints with meds. She is still depressed and still has OCD of course but is improved with the Spravato.  She is tolerating the medications well.  Just increased Auvelity to BID yesterday and reduced Wellbutrin to 150 AM. No SE so far.  No change in mood or anxiety so far.  Chronic OCD as noted and residual depression and chronic fatigue.  04/21/2022 appointment with the following noted: Patient received Spravato 84 mg today.  She tolerated it well without any unusual headache, nausea or vomiting or other somatic symptoms.  Dissociation did occur and she gradually saw resolution over the 2-hour period of observation.  She does not typically find the dissociation very strong. No SE complaints with  meds. She is still depressed and still has OCD of course but is improved with the Spravato.   She has questions about the dosing of lorazepam. She tends to have negative anxious thoughts at night.  This tends to interfere with her ability to go to sleep.  She is getting about 8 to 9 hours of sleep.  She is tolerating the meds without excessive sedation and does not nap during the day.  05/06/22 appt noted: Patient received Spravato 84 mg today.  She tolerated it well without any unusual headache, nausea or vomiting or other somatic symptoms.  Dissociation did occur and she gradually saw resolution over the 2-hour period of observation.  She does not typically find the dissociation very strong. No SE complaints with meds. She is still depressed and still has OCD of course but is improved with the Spravato.   Had some questions about timing of dosing of fluvoxamine and Auvelity. OCD is not quite as time consuming.  Sleep and eating are the same.   No SE meds.  05/25/22 appt noted: Patient received Spravato 84 mg today.  She tolerated it well without any unusual headache, nausea or vomiting or other somatic symptoms.  Dissociation did occur and she gradually saw resolution over the 2-hour period of observation.  She does not typically find the dissociation very strong. No SE complaints with meds. She is still depressed and still has OCD of course but is improved with the Spravato.   She has less OCD when away from home and on vacation of note. Plan: Rec gradually reduce HS lorazepam to 1 mg Hs.  Can continue lorazepam 2 mg AM and 1 mg in afternoon bc of chronic anxiety and it is helpful and tolerated. She can continue temazepam 30 mg nightly.  She tends to have a lot of anxious negative thoughts at night when she is trying to go to bed  06/16/22 appt noted: Patient received Spravato 84 mg today.  She tolerated it well without any unusual headache, nausea or vomiting or other somatic symptoms.   Dissociation did occur and she gradually saw resolution over the 2-hour period of observation.  She does not typically find the dissociation very strong. No SE complaints with meds. She is still depressed and still has OCD of course but is improved with the Spravato.   She has less OCD when away from home and on vacation of note. She is tolerating  the medications.  She has continued current medications. 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  She still receives benefit from [place].  Her depression and OCD are better with the Spravato.  She thinks she is getting additional benefit with the switch from Wellbutrin to [place].  07/04/2022 appointment noted: Reports she developed a rash on her face from [facility] and feels like she is allergic to it.  She stopped it and went back to Wellbutrin 450 mg every morning.  The rash has cleared up.  She did not require any medical attention and did not have shortness of breath. Overall her depression and OCD are about the same as they have been.  She did not notice a substantial difference from the brief treatment with Auvelity but she understands she did not take a full course.  She is tolerating the current medicines well. Current meds fluvoxamine 400 mg daily, Wellbutrin XL 450 mg daily, lorazepam 1 to 2 mg in the morning and 1 to 2 mg at night and 1 mg in the afternoon, temazepam 30 mg nightly. She wants to continue the Spravato because she feels it has been helpful for both her depression and her racing OCD  07/18/22 appt noted: Patient received Spravato 84 mg today.  She tolerated it well without any unusual headache, nausea or vomiting or other somatic symptoms.   Dissociation did occur and she gradually saw resolution over the 2-hour period of observation.  She does not typically find the dissociation very strong. No SE complaints with meds. She is still depressed and still has OCD of course but is improved with the Spravato.  Rash better off Auvelity and back on Welllbutrin XL 450 mg AM, fluvoxamine 400 mg daily.  Previous psych med trials include Prozac, paroxetine, sertraline, fluvoxamine, venlafaxine, Anafranil with no response,  Wellbutrin, , Viibryd, Trintellix 10 1 month NR Geodon,  risperidone, Rexulti, Abilify,  Seroquel, Latuda 40 mg with irritability.  lamotrigine lithium,  BuSpar, Namenda,  pramipexole with no response, and Topamax, pindolol  ECT-MADRS    [place] Office Visit from 06/29/2021 in [place] Total Score 36      Flowsheet Row Admission (Discharged) from 06/11/2021 in [place] No Risk        Review of Systems:  Review of Systems  Constitutional:  Positive for fatigue.  Cardiovascular:  Negative for chest pain and palpitations.  Musculoskeletal:  Positive for arthralgias, back pain, gait problem and neck pain. Negative for joint swelling.  Neurological:  Positive for weakness. Negative for tremors.  Psychiatric/Behavioral:  Positive for dysphoric mood. Negative for agitation and suicidal ideas. The patient is nervous/anxious.     Medications: I have reviewed the patient's current medications.  Current Outpatient Medications  Medication Sig Dispense Refill   Abaloparatide (TYMLOS) 3120 MCG/1.56ML SOPN Inject into the skin.     Azelastine-Fluticasone 137-50 MCG/ACT SUSP Place 1-2 sprays into both nostrils daily.     baclofen (LIORESAL) 10 MG tablet Take 20 mg by mouth at bedtime as needed for muscle spasms.     buPROPion (WELLBUTRIN XL) 150 MG 24 hr tablet TAKE 3 TABLETS BY MOUTH DAILY (  Patient taking differently: Take 450 mg by mouth daily.)  270 tablet 0   docusate sodium (COLACE) 100 MG capsule Take 1 capsule (100 mg total) by mouth 2 (two) times daily. (Patient taking differently: Take 100 mg by mouth daily.) 10 capsule 0   Esketamine HCl, 84 MG Dose, (SPRAVATO, 84 MG DOSE,) 28 MG/DEVICE SOPK USE 3 SPRAYS IN EACH NOSTRIL ONCE A WEEK 3 each 3   fexofenadine (ALLEGRA) 180 MG tablet Take 180 mg by mouth daily.     fluvoxaMINE (LUVOX) 100 MG tablet Take one tablet (100 mg) by mouth every morning and 3 tablets (300 mg) at bedtime. 120 tablet 5   hydrocortisone (ANUSOL-HC) 2.5 % rectal cream Place rectally 2 (two) times daily. x 7-14 days 30 g 0   ketotifen (ZADITOR) 0.025 % ophthalmic solution Place 3 drops into both eyes at bedtime.     LORazepam (ATIVAN) 1 MG tablet TAKE 1-2 IN THE AM AND 1-2 TABLETS EVERY NIGHT AT BEDTIME AND 1 TABLET IN AFTERNOON when needed for anxiety and sleep 150 tablet 1   magnesium gluconate (MAGONATE) 500 MG tablet Take 500 mg by mouth daily.     MIBELAS 24 FE 1-20 MG-MCG(24) CHEW Chew 1 tablet by mouth at bedtime as needed (bowel regularity).     Multiple Vitamins-Minerals (ADULT GUMMY PO) Take 2 tablets by mouth in the morning.     nitrofurantoin (MACRODANTIN) 100 MG capsule Take 100 mg by mouth as needed (For urinary tract infection.).      oxyCODONE-acetaminophen (PERCOCET/ROXICET) 5-325 MG tablet Take 1-2 tablets by mouth every 6 (six) hours as needed for severe pain. 50 tablet 0   polyethylene glycol (MIRALAX / GLYCOLAX) packet Take 17 g by mouth daily as needed for mild constipation. 14 each 0   psyllium (METAMUCIL) 58.6 % powder Take 1 packet by mouth daily as needed (constipation).     temazepam (RESTORIL) 30 MG capsule TAKE 1 CAPSULE BY MOUTH AT BEDTIME AS NEEDED FOR SLEEP. 30 capsule 1   Vitamin D-Vitamin K (VITAMIN K2-VITAMIN D3 PO) Take 1-2 sprays by mouth daily.     dicyclomine (BENTYL) 10 MG capsule Take 10 mg by mouth daily. (Patient not taking: Reported on 07/18/2022)     No current  facility-administered medications for this visit.    Medication Side Effects: None   Allergies:  Allergies  Allergen Reactions   Hydrocodone Itching   Sulfamethoxazole-Trimethoprim Itching   Dust Mite Extract Other (See Comments)    Sneezing, watery eyes, runny nose   Latex Itching   Other Other (See Comments)    PT IS ALLERGIC TO CAT DANDER AND RAGWEED - Sneezing, watery eyes, runny nose    Pollen Extract Other (See Comments)    Sneezing, watery eyes, runny nose     Past Medical History:  Diagnosis Date   Abnormal Pap smear 2011   hpv/mild dysplasia,cin1   Anxiety    Cerebral palsy (HCC)    right arm/leg   Cystocele    Depression    Headache    Neuromuscular disorder (HCC)    Cerebral Palsy   OCD (obsessive compulsive disorder)    Osteoporosis    Uterine prolaps     Family History  Problem Relation Age of Onset   Cancer Father        skin AND LUNG   Alcohol abuse Sister        CRACK COCAINE    Social History   Socioeconomic History   Marital status: Married  Spouse name: Not on file   Number of children: Not on file   Years of education: Not on file   Highest education level: Not on file  Occupational History   Not on file  Tobacco Use   Smoking status: Never   Smokeless tobacco: Never  Substance and Sexual Activity   Alcohol use: Not Currently    Comment: OCCASIONAL beer   Drug use: No   Sexual activity: Yes    Birth control/protection: Pill    Comment: LOESTRIN 24 FE  Other Topics Concern   Not on file  Social History Narrative   Not on file   Social Determinants of Health   Financial Resource Strain: Not on file  Food Insecurity: Not on file  Transportation Needs: Not on file  Physical Activity: Not on file  Stress: Not on file  Social Connections: Not on file  Intimate Partner Violence: Not on file    Past Medical History, Surgical history, Social history, and Family history were reviewed and updated as appropriate.   Please  see review of systems for further details on the patient's review from today.   Objective:   Physical Exam:  LMP  (LMP Unknown)   Physical Exam Constitutional:      General: She is not in acute distress. Neurological:     Mental Status: She is alert and oriented to person, place, and time.     Cranial Nerves: No dysarthria.     Motor: Weakness present.     Gait: Gait abnormal.  Psychiatric:        Attention and Perception: Attention and perception normal.        Mood and Affect: Mood is anxious and depressed. Affect is not labile.        Speech: Speech normal.        Behavior: Behavior normal. Behavior is not slowed. Behavior is cooperative.        Thought Content: Thought content normal. Thought content is not delusional. Thought content does not include homicidal or suicidal ideation. Thought content does not include suicidal plan.        Cognition and Memory: Cognition and memory normal. Cognition is not impaired.        Judgment: Judgment normal.     Comments: Insight intact Pleasant. Ongoing OCD remains fairly severe but less anxious Checking compulsions up to 2 hours daily but improved noticeably Chronic depression persistent but better with Spravato Still can be easily upset      Lab Review:     Component Value Date/Time   NA 138 06/11/2021 0606   K 4.0 06/11/2021 0606   CL 107 06/11/2021 0606   CO2 26 06/11/2021 0606   GLUCOSE 90 06/11/2021 0606   BUN 18 06/11/2021 0606   CREATININE 0.81 06/11/2021 0606   CALCIUM 9.4 06/11/2021 0606   PROT 6.5 06/11/2021 0606   ALBUMIN 3.3 (L) 06/11/2021 0606   AST 17 06/11/2021 0606   ALT 14 06/11/2021 0606   ALKPHOS 141 (H) 06/11/2021 0606   BILITOT 0.2 (L) 06/11/2021 0606   GFRNONAA >60 06/11/2021 0606   GFRAA >60 07/09/2016 0438       Component Value Date/Time   WBC 5.8 06/11/2021 0606   RBC 4.12 06/11/2021 0606   HGB 12.5 06/11/2021 0606   HCT 39.7 06/11/2021 0606   PLT 299 06/11/2021 0606   MCV 96.4  06/11/2021 0606   MCH 30.3 06/11/2021 0606   MCHC 31.5 06/11/2021 0606   RDW 13.9 06/11/2021 0606  LYMPHSABS 1.9 06/11/2021 0606   MONOABS 0.5 06/11/2021 0606   EOSABS 0.1 06/11/2021 0606   BASOSABS 0.0 06/11/2021 0606    No results found for: "POCLITH", "LITHIUM"   No results found for: "PHENYTOIN", "PHENOBARB", "VALPROATE", "CBMZ"   .res Assessment: Plan:    Zaiyah was seen today for follow-up, depression, anxiety and fatigue.  Diagnoses and all orders for this visit:  Recurrent major depression resistant to treatment [facility])  Mixed obsessional thoughts and acts  Insomnia due to mental condition    Both Dx are TR and marked.  Impaired function but less so with Spravato re: depression..   She is receiving Spravato 84 mg weekly and marked improvement in the depression..  she feels it also helps OCD somewhat.  However still easily overwhelmed with low stress tolerance.  The OCD is improved with the increase in fluvoxamine and with Spravato.  Spends 2 hours daily and checking compulsions on her worst days but better when she travels.  She has been on higher doses of fluvoxamine above the usual max of 400 mg daily in the past.  This became difficult to obtain at 1 point and the dose was reduced to 300 mg daily.   Disc SE. She would like to continue Luvox 400 mg daily again    She is tolerating the meds well  Continue Increased Luvox back to 400 mg nightly as of January 2023. Disc dosing higher than usual.  She feels this is increase has helped more with OCD which remains chronically severe.  Rash better off Auvelity Per her request continue Wellbutrin XL 450 mg every morning.  Disc Spravato DT TRD incl details and SE. Disc dosing and duration.  Pt with severe depression MADRS 36 on 06/29/21  Patient was administered Spravato 84 mg intranasally dosage today.  The patient experienced the typical dissociation which gradually resolved over the 2-hour period of observation.  There  were no complications.  Specifically the patient did not have nausea or vomiting or headache.  Blood pressures remained within normal ranges at the 40-minute and 2-hour follow-up intervals.  By the time the 2-hour observation period was met the patient was alert and oriented and able to exit without assistance. She tends to have lingering sedative effects but not severe. .  See nursing note for further details. Per protocol will continue Spravato to 84 mg weekly.  She has been okay since cutting back to once weekly.  We discussed the short-term risks associated with benzodiazepines including sedation and increased fall risk among others.  Discussed long-term side effect risk including dependence, potential withdrawal symptoms, and the potential eventual dose-related risk of dementia.  But recent studies from 2020 dispute this association between benzodiazepines and dementia risk. Newer studies in 2020 do not support an association with dementia. Disc this is high dose and not ideal.  Also disc risk combining it with temazepam. Rec gradually reduce HS lorazepam to 1 mg Hs.  Can continue lorazepam 2 mg AM and 1 mg in afternoon bc of chronic anxiety and it is helpful and tolerated. She can continue temazepam 30 mg nightly.  She tends to have a lot of anxious negative thoughts at night when she is trying to go to bed.  She is trying to reduce the dose.  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  Supportive therapy dealing with some of the recent stressors including family stressors and her own health related to cerebral palsy   Follow-up weekly .  Lynder Parents, MD, DFAPA  Please see After Visit Summary for patient specific instructions.  Future Appointments  Date Time Provider [place]  07/29/2022 11:00 AM Blanchie Serve, PhD CP-CP None  08/16/2022 11:00 AM Blanchie Serve, PhD CP-CP None  08/30/2022 11:00 AM Blanchie Serve, PhD CP-CP None  09/13/2022 11:00  AM Blanchie Serve, PhD CP-CP None    No orders of the defined types were placed in this encounter.    -------------------------------

## 2022-07-18 NOTE — Progress Notes (Signed)
NURSE Visit:   Pt arrived for her weekly Spravato Treatment, she started Spravato treatments on 10/07/2021, she continues with 84 mg (3 of the 28 mg) Spravato nasal spray once a week treatments, which is the maintenance dose for her treatment resistant depression. This is treatment #39.   She was directed to the treatment room to get vitals taken first. Initial vital signs are B/P at 3:00 PM 108/52, 93, Pt instructed to blow her nose and to recline back at 45 degrees. Pt given first nasal spray (28 mg) administered by pt observed by nurse. Pt always has to go to the restroom after her first dose, at that point she is not too medicated to walk to bathroom safely.There were 5 minutes between each dose, total of 84 mg. Tolerated well. Pt's medication is delivered by [facility] in [place] and stored inside a safe behind a locked door as well. Spravato is a CIII medication and has to be only given at a treatment facility and observed by nurse as pt administered intranasally.  Pt's 40 minute vital signs at 3:40 PM 102/61, 90. Dr. Clovis Pu met with pt to discuss her care at the end of her treatment when her thoughts are clearer and they discussed her medication and moods. She does go to the bathroom at least once during her treatment and again towards the end when she is clearer and able to walk. No sedation and had slight feeling of being "high" she reports. Discharge vitals at 5:00 PM 116/77, 85. Pt stable for discharge.  Pt was observed on site a total of 120 minutes per FDA/REMS requirements. Pt was with nurse for clinical assessment 50 minutes. Pt will start coming every other week instead of weekly. Pt. Is scheduled on October 16th.   [id number]   EXP APR 2025

## 2022-07-19 ENCOUNTER — Ambulatory Visit: Payer: 59 | Admitting: Psychiatry

## 2022-07-22 ENCOUNTER — Other Ambulatory Visit: Payer: Self-pay | Admitting: Obstetrics and Gynecology

## 2022-07-26 ENCOUNTER — Other Ambulatory Visit (HOSPITAL_BASED_OUTPATIENT_CLINIC_OR_DEPARTMENT_OTHER): Payer: Self-pay

## 2022-07-28 ENCOUNTER — Other Ambulatory Visit: Payer: Self-pay | Admitting: Psychiatry

## 2022-07-29 ENCOUNTER — Ambulatory Visit (INDEPENDENT_AMBULATORY_CARE_PROVIDER_SITE_OTHER): Payer: 59 | Admitting: Psychiatry

## 2022-07-29 DIAGNOSIS — Z636 Dependent relative needing care at home: Secondary | ICD-10-CM | POA: Diagnosis not present

## 2022-07-29 DIAGNOSIS — F339 Major depressive disorder, recurrent, unspecified: Secondary | ICD-10-CM

## 2022-07-29 DIAGNOSIS — Z638 Other specified problems related to primary support group: Secondary | ICD-10-CM | POA: Diagnosis not present

## 2022-07-29 DIAGNOSIS — F4010 Social phobia, unspecified: Secondary | ICD-10-CM | POA: Diagnosis not present

## 2022-07-29 DIAGNOSIS — F422 Mixed obsessional thoughts and acts: Secondary | ICD-10-CM

## 2022-07-29 NOTE — Progress Notes (Unsigned)
Psychotherapy Progress Note [facility] [name], PhD LP  Patient ID: Casey Diaz (Casey "[name]")    MRN: [id number] Therapy format: Individual psychotherapy Date: 07/29/2022      Start: 11:08a     Stop: 11:57a     Time Spent: 49 min Location: In-person   Session narrative (presenting needs, interim history, self-report of stressors and symptoms, applications of prior therapy, status changes, and interventions made in session) Per EHR, recent surgery for endometrial issue and suffering a prolonged ear infection, neither brought up.  Focused on drama with brother [name], who is essentially off meds, manic, drinking, been taking an e-bike they gave him to a bar, had a wreck and hurt his knee.  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  Knows he and M get on each other's nerves.  As usual, [name] worries about being accused of enabling, with capable brother Ed exempting himself and H [name] by now known to react to almost any mention of family troubles with the idea she is getting in too deep and has to let them fend for themselves.  Sister also negative about it sometimes.   Discussed boundaries, validated wishes to help loved ones, noted how she did already set limits on unreasonable requests with [name] and affirmed how she used her discretion, actually.  Support in, as she sees it, having to keep the subject out of [name]'s attention, while modeling possible way to represent it to him if he criticizes just caring.  Did not come back to parenting or sexual concerns, but does reiterate how she is the only one of her family interested in church, and how she misses [name], agreed because she was very tangibly not reachable for family drama and judgment calls about helping.  Encouraged to watch out for catastrophizing thought patterns, take care to self-affirm what is realistic and  helpful, acknowledge her strong empathy, and take constructive action.  Therapeutic modalities: Cognitive Behavioral Therapy, Solution-Oriented/Positive Psychology, and Ego-Supportive  Mental Status/Observations:  Appearance:   Casual     Behavior:  Appropriate  Motor:  Normal  Speech/Language:   Clear and Coherent  Affect:  Appropriate  Mood:  anxious and dysthymic  Thought process:  normal  Thought content:    Rumination  Sensory/Perceptual disturbances:    WNL  Orientation:  Fully oriented  Attention:  Good    Concentration:  Good  Memory:  WNL  Insight:    Good  Judgment:   Good  Impulse Control:  Fair   Risk Assessment: Danger to Self: No Self-injurious Behavior: No Danger to Others: No Physical Aggression / Violence: No Duty to Warn: No Access to Firearms a concern: No  Assessment of progress:  progressing  Diagnosis:   ICD-10-CM   1. 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 Relationship problem with family members  Z63.8     3. Caregiver stress  Z63.6     4. Social anxiety disorder  F40.10     5. Mixed obsessional thoughts and acts  F42.2      Plan:  Self-affirm that wanting to help family is not dysfunctional, it is all judgment calls Option Al-Anon for support and boundary help Open invitation for H to join; same for other family members, at discretion Consider addressing H's overinterpretation of "choosing" FOO over FOI/him and perceived negativity toward [facility] Consider further willingness  to challenge sexual habits on grounds of feeling left out, ask for H to confer re sexual expectations rather than simply accede to demands. Continue efforts to engage exercise, part-time work, and supportive relationship outside the home Continue appropriate efforts to [employer] socializing and responsibility to clean up after himself, e.g., "after you ___, then you can ___" Re. perceived loss of close relationship with son, self-affirm  that she always wanted to create a "buddy" but any adolescent boy still distances and may go through a sullen, isolative period Generally, look for thought patterns of shaming self irrationally and collecting troubles to the point of wanting to leave town -- both are distress-making and may interfere with applying interpersonal effectiveness principles. Other recommendations/advice as may be noted above Continue to utilize previously learned skills ad lib Maintain medication as prescribed and work faithfully with relevant prescriber(s) if any changes are desired or seem indicated Call the clinic on-call service, 988/hotline, 911, or present to [facility] or ER if any life-threatening psychiatric crisis Return for session(s) already scheduled. Already scheduled visit in this office 08/01/2022.  Blanchie Serve, PhD Luan Moore, PhD LP Clinical Psychologist, [facility] [address], [place], [place] [address] [phone]

## 2022-08-01 ENCOUNTER — Other Ambulatory Visit: Payer: Self-pay

## 2022-08-01 ENCOUNTER — Encounter: Payer: 59 | Admitting: Psychiatry

## 2022-08-01 ENCOUNTER — Telehealth: Payer: Self-pay | Admitting: Psychiatry

## 2022-08-01 DIAGNOSIS — F5105 Insomnia due to other mental disorder: Secondary | ICD-10-CM

## 2022-08-01 MED ORDER — TEMAZEPAM 30 MG PO CAPS: ORAL_CAPSULE | ORAL | 2 refills | Status: DC

## 2022-08-01 NOTE — Telephone Encounter (Signed)
Casey Diaz is requesting a refill on her Temazepam 30 mg called to:  [id number] [address], [place]  Phone:  [phone]  Fax:  [phone]

## 2022-08-01 NOTE — Telephone Encounter (Signed)
Pended.

## 2022-08-02 ENCOUNTER — Ambulatory Visit: Payer: 59 | Admitting: Psychiatry

## 2022-08-10 ENCOUNTER — Ambulatory Visit: Payer: 59 | Admitting: Podiatry

## 2022-08-15 ENCOUNTER — Other Ambulatory Visit: Payer: Self-pay

## 2022-08-15 ENCOUNTER — Ambulatory Visit (INDEPENDENT_AMBULATORY_CARE_PROVIDER_SITE_OTHER): Payer: 59 | Admitting: Psychiatry

## 2022-08-15 ENCOUNTER — Ambulatory Visit: Payer: 59

## 2022-08-15 VITALS — BP 128/77 | HR 86

## 2022-08-15 DIAGNOSIS — F4010 Social phobia, unspecified: Secondary | ICD-10-CM

## 2022-08-15 DIAGNOSIS — F422 Mixed obsessional thoughts and acts: Secondary | ICD-10-CM

## 2022-08-15 DIAGNOSIS — F5105 Insomnia due to other mental disorder: Secondary | ICD-10-CM | POA: Diagnosis not present

## 2022-08-15 DIAGNOSIS — F339 Major depressive disorder, recurrent, unspecified: Secondary | ICD-10-CM

## 2022-08-15 MED ORDER — SPRAVATO (84 MG DOSE) 28 MG/DEVICE NA SOPK: PACK | NASAL | 3 refills | Status: DC

## 2022-08-16 ENCOUNTER — Encounter: Payer: Self-pay | Admitting: Psychiatry

## 2022-08-16 ENCOUNTER — Ambulatory Visit (INDEPENDENT_AMBULATORY_CARE_PROVIDER_SITE_OTHER): Payer: 59 | Admitting: Psychiatry

## 2022-08-16 DIAGNOSIS — Z636 Dependent relative needing care at home: Secondary | ICD-10-CM | POA: Diagnosis not present

## 2022-08-16 DIAGNOSIS — F422 Mixed obsessional thoughts and acts: Secondary | ICD-10-CM | POA: Diagnosis not present

## 2022-08-16 DIAGNOSIS — G809 Cerebral palsy, unspecified: Secondary | ICD-10-CM

## 2022-08-16 DIAGNOSIS — Z638 Other specified problems related to primary support group: Secondary | ICD-10-CM | POA: Diagnosis not present

## 2022-08-16 DIAGNOSIS — F4010 Social phobia, unspecified: Secondary | ICD-10-CM

## 2022-08-16 DIAGNOSIS — F339 Major depressive disorder, recurrent, unspecified: Secondary | ICD-10-CM | POA: Diagnosis not present

## 2022-08-16 MED ORDER — FLUVOXAMINE MALEATE 100 MG PO TABS: ORAL_TABLET | ORAL | 0 refills | Status: DC

## 2022-08-16 NOTE — Progress Notes (Signed)
Casey Diaz [id number] 09-Jan-1966 56 y.o.    Subjective:   Patient ID:  Casey Diaz is a 56 y.o. (DOB Dec 07, 1965) female.  Chief Complaint:  Chief Complaint  Patient presents with   Follow-up   Depression   Anxiety   Fatigue     HPI Casey Diaz presents to the office today for follow-up of OCD and severe anxiety.     December 2019 visit the following was noted: No meds were changed. Lives in [place] and back for followup.  Sx are about the same.  Has to take meds with different sizes. Pt reports that mood is Anxious and Depressed and describes anxiety as Severe. Anxiety symptoms include: Excessive Worry, Obsessive Compulsive Symptoms:   Checking,,. Pt reports has interrupted sleep and nocturia. Pt reports that appetite is good. Pt reports that energy is no change and down slightly. Concentration is down slightly. Suicidal thoughts:  denied by patient. Loves the environment of [place] but misses some things there.  She's not able to work there.  H works there and likes it.  Struggled with not working, feels isolated and not up to task of meeting people.  Does attend a church and met a friend who's been helpful.  Leaving for [place] on 10/16/18.   04/09/2020 appointment the following is noted:  Staying another year in [place] bc Covid and other things. Last few months a lot of crying spells.  Is in menopause. Wonders about med changes though is nervous about it.  Crying spells associated with depressing thoughts more than stress or OCD.   Covid really hard on everyone and couldn't see family for 18 mos.  Family still very dysfunctional. No close friends in part due to OCD and depression. Son high Autism spectrum with ADHD and anxiety and she's with him all the time. Greater health problems with CP so more pains.   05/15/20 appt with the following noted: [name] for menopause and helps some. Still depressed.  Chronically. In [place] for 2 more weeks then to [place] for  another year. A lot of stressors lately triggering more checking and anxiety.   OCD is her CC now and seems.  Got worse DT stress.   Stressed with Asberger's son and her health.  H works a lot.  Her FOO still stress. Plan: Trintellix 10 mg 1 tablet in the morning with food and reduce fluvoxamine to 5 tablets nightly for 1 week  then reduce it to 4 tablets nightly.   07/02/20 appt with the following noted: Decided not to get Trintellix bc difficulty getting it. It is available.  There.  Wants to start it now.   Both depression and OCD are severe.  Not suicidal in intent or plan. Did not take samples with her to [place] but will be back in December. covid is worse there and travel is difficult.  Wants to reduce Wellbutrin DT dry mouth. Plan: She's afraid to reduce Luvox at this time DT fear of worsening OCD.  But will consider. Trintellix 10 mg 1 tablet in the morning with food and reduce fluvoxamine to 5 tablets nightly for 1 week  then reduce it to 4 tablets nightly. Also reduce Wellbutrin XL to 300 mg daily.    9-13 2022 appointment with the following noted: Back in [place] since July 14.  Broke arm a month ago and surgery.  It's all been rough adjustment.   B has cancer on his face and M fell taking him to the doctor.  Misses the water and weather of [place].   Cry a lot more since menopause. Still depression and anxiety and OCD.  Asks about ketamine. On Wellbutrin 300, Luvox 300.  No Trintellix. Added Ativan 2 mg AM and HS and it helps.  More likely to get upset at night. Plan: Increase Luvox back to 400 mg daily.  She thinks she's worse on less. Continue Wellbutrin XL to 300 mg daily. 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  She tolerated it well without unusual nausea or vomiting headache or other somatic symptoms.  She did have the expected dissociation which gradually resolved over  the course of the 2-hour period of observation.  She was a little concerned about her balance given her cerebral palsy but has not noted unusual or unexpected problems.  She is motivated to can continue Spravato in hopes of reducing her depressive symptoms. 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  She also has treatment resistant OCD which is partially managed with medications but is still quite disabling.  She is tolerating the medications well.  She is sleeping adequately.  Her appetite is adequate.  She is not having suicidal thoughts.  She continues to wish for a better treatment for OCD that would give her some relief.  09/30/2021 appointment with the following noted: She received her first dose of Spravato 84 mg intranasally today.  She tolerated it well without unusual nausea, vomiting, or other somatic symptoms.  Dissociation as expected did occur and gradually resolved over the 2-hour period of observation.  She did have a mild headache today with the treatment and received ibuprofen 600 mg at her request.  We will follow this to see if it is a pattern Patient is still depressed.  She said she was late with her medicine today and today was a particularly depressing day.  However she notes that the Spravato has lifted her mood considerably even today.  She is hopeful that it will continue to be helpful.  No suicidal thoughts.  She has ongoing chronic anxiety and OCD at baseline.  10/04/21 appt noted: Patient received Spravato 84 mg for the second time today.  She tolerated it well without any unusual headache, nausea or vomiting or other somatic symptoms.  Dissociation did occur and she gradually [place] resolution over the 2-hour period of observation. She did not have any unusual problems after she left the office last Spravato administration.  She did not have any specific problems with balance or walking.  She is at increased risk of that difficulty because of  cerebral palsy.  So far she has not noticed much mood effect from the medication beyond the first day of receiving it.  However she would like to continue Spravato in hopes of getting the antidepressant effect that is desired. Stress dealing with mother's behavior at party pt hosted.  Guilt over it.  10/07/2021 appointment noted: Patient received Spravato 84 mg for the second time today.  She tolerated it well without any unusual headache, nausea or vomiting or other somatic symptoms.  Dissociation did occur and she gradually [place] resolution over the 2-hour period of observation. She still is not sure about the antidepressant effect of Spravato.  Events over the holidays and demands, make it difficult to assess.  She still notes that the OCD tends to worsen the depression and vice versa.  She tolerates the  Spravato well and wants to continue the trial.  10/15/2021 appointment with the following noted: Patient received Spravato 84 mg for the second time today.  She tolerated it well without any unusual headache, nausea or vomiting or other somatic symptoms.  Dissociation did occur and she gradually [place] resolution over the 2-hour period of observation. Patient says it was somewhat difficult to evaluate the effect of the Spravato.  It was scheduled to be twice weekly for 4 weeks consecutively but the holidays have interfered with that administration.  She asked what specifically should be she should be looking for in order to assess improvement.  That was discussed.  The OCD is unchanged and the depression so far is not significantly different.  She still tolerates meds.  There have been no recent med changes  10/19/2021 appt noted: Patient received Spravato 84 mg for the second today.  She tolerated it well without any unusual headache, nausea or vomiting or other somatic symptoms.  Dissociation did occur and she gradually saw resolution over the 2-hour period of observation.   10/21/2021 appointment  noted: Patient received Spravato 84 mg today.  She tolerated it well without any unusual headache, nausea or vomiting or other somatic symptoms.  Dissociation did occur and she gradually saw resolution over the 2-hour period of observation.  She feels better than last week.  She is not as depressed and down.  She is still dealing with grief around the death of her cousin that was unexpected.  It is still difficult to tell how much the Spravato was doing but she is hopeful.  Anxiety is still present with the OCD.  She is not having suicidal thoughts.  She is not hopeless.  She wants to continue treatment.  10/25/2021 appointment with the following noted: Patient received Spravato 84 mg today.  She tolerated it well without any unusual headache, nausea or vomiting or other somatic symptoms.  Dissociation did occur and she gradually saw resolution over the 2-hour period of observation.  She does not typically find the dissociation very strong. She is beginning to think the Spravato is helping somewhat with the depression.  It has been difficult to tell with the holidays intervening as well as the death of her cousin.  She has not been able to get Spravato twice weekly for 4 weeks straight as typically planned.  However she is hopeful.  The OCD remains significant.  She still has a tendency to think very negatively.  She is not suicidal.  10/28/2021 appointment with the following noted: Patient received Spravato 84 mg today.  She tolerated it well without any unusual headache, nausea or vomiting or other somatic symptoms.  Dissociation did occur and she gradually saw resolution over the 2-hour period of observation.  She does not typically find the dissociation very strong. She is feeling more hopeful about the administration of Spravato.  She is having less depression she believes.  Still not dramatically different.  She still has a tendency to have a lot of anxiety and rumination and OCD.  She is not suicidal.   She is eager to continue the Spravato.  11/01/2021 appointment with the following noted: Patient received Spravato 84 mg today.  She tolerated it well without any unusual headache, nausea or vomiting or other somatic symptoms.  Dissociation did occur and she gradually saw resolution over the 2-hour period of observation.  She does not typically find the dissociation very strong. She is continuing to see a little bit of improvement in depression  with Spravato.  The anxiety remains but may be not as severe.  The OCD remains markedly severe chronically.  She is not suicidal.  She is encouraged by the degree of improvement with Spravato and inability to enjoy things more and not be quite as ruminative.  11/04/2021 appt noted: Patient received Spravato 84 mg today.  She tolerated it well without any unusual headache, nausea or vomiting or other somatic symptoms.  Dissociation did occur and she gradually saw resolution over the 2-hour period of observation.  She does not typically find the dissociation very strong. No SE complaints with meds. She continues to feel hopeful about the Spravato.  She has less depression.  Because of a number of factors she is uncertain of the full benefit but thinks she is somewhat less depressed.  Her anxiety and OCD remain significant but a little better.  She is tolerating the medications and does not desire medicine change.  She is not currently complaining of insomnia.   11/08/2021 appointment the following noted: Patient received Spravato 84 mg today.  She tolerated it well without any unusual headache, nausea or vomiting or other somatic symptoms.  Dissociation did occur and she gradually saw resolution over the 2-hour period of observation.  She does not typically find the dissociation very strong. No SE complaints with meds. She feels the Spravato is helping somewhat.  She would like to see a greater effect.  However she is able to enjoy things.  She is productive at home.   She would like to see a lifting of a degree of sadness that remains.  The anxiety and OCD remained largely unchanged.  She wondered about the dosing of Wellbutrin 300 mg a day and Luvox 300 mg a day and possible increases.  She has been at higher doses in the past.  She plans to start water therapy for her weakness and for her shoulder.  11/11/2021 appointment with the following noted: Patient received Spravato 84 mg today.  She tolerated it well without any unusual headache, nausea or vomiting or other somatic symptoms.  Dissociation did occur and she gradually saw resolution over the 2-hour period of observation.  She does not typically find the dissociation very strong. No SE complaints with meds. She feels the Spravato is clearly helping the depression.  She would like to see a more significant effect.  She is still having trouble thinking positive. Her energy is fair.  Concentration is good except for the problem with chronic obsessions. She has been taking Wellbutrin 300 mg in Luvox 300 mg for quite some time but has taken higher doses in the past.  We discussed that.  She would like to try higher doses in order to get a better effect if possible. We just increased the doses a couple of days ago.  No effect yet.  11/15/2021 appointment with the following noted: Patient received Spravato 84 mg today.  She tolerated it well without any unusual headache, nausea or vomiting or other somatic symptoms.  Dissociation did occur and she gradually saw resolution over the 2-hour period of observation.  She does not typically find the dissociation very strong. No SE complaints with meds. The patient is now convinced that the Spravato is helping the depression.  She would like to continue twice weekly Spravato this week if possible.  She has tolerated the increase in Wellbutrin to 450 mg daily and the increase and fluvoxamine to 400 mg daily without complications thus far.  The OCD and anxiety feed  the  depression to some extent. She spends approximately 2 hours daily with checking compulsions due to obsessions about causing harm to others.  For example fearing that when she has hit a pot hole that she may have hit a person and going back to check.  Checking corners and rooms out of fear that she may have harmed someone.  Other various checking compulsions.  She is hoping the increase in fluvoxamine to 400 mg will reduce that over the weeks to come.  She is not seeing a significant difference with the addition of the Spravato though she understands that was not expected.  She is more productive at home and more motivated and able to enjoy things more fully as a result of the Spravato treatment.  She is tolerating the medication  11/18/2021 appointment with the following noted: Patient received Spravato 84 mg today.  She tolerated it well without any unusual headache, nausea or vomiting or other somatic symptoms.  Dissociation did occur and she gradually saw resolution over the 2-hour period of observation.  She does not typically find the dissociation very strong. No SE complaints with meds. She clearly believes the Spravato has been helpful for the depression.  She wonders whether to continue to treatments weekly or to cut back to 1 weekly.  She would like to continue twice weekly in hopes of getting additional improvement in the depression because it is not resolved but it is difficult to get here twice a week in terms of arranging rides. She is recently increased Wellbutrin XL to 450 mg daily and fluvoxamine to 400 mg daily but they have not had time to have an official effect.  She is tolerating that well.  She is tolerating meds overwork overall well. The OCD remains the same as noted on 11/15/2021  11/25/21 appt noted: Patient received Spravato 84 mg today.  She tolerated it well without any unusual headache, nausea or vomiting or other somatic symptoms.  Dissociation did occur and she gradually saw  resolution over the 2-hour period of observation.  She does not typically find the dissociation very strong. No SE complaints with meds. She thinks the increase in Wellbutrin and Luvox have been potentially helpful for depression and OCD respectively.  It has been too early to see the full effect.  She is sleeping and eating well.  She is functioning at home.  She still spends a lot of time that is about 2 hours a day dealing with compulsive behaviors.  12/02/21 appt noted: Patient received Spravato 84 mg today.  She tolerated it well without any unusual headache, nausea or vomiting or other somatic symptoms.  Dissociation did occur and she gradually saw resolution over the 2-hour period of observation.  She does not typically find the dissociation very strong. No SE complaints with meds. Several losses and stressors recently that affect her sense of mood. However still sees significant benefit from the Spravato for her depression.  Wants to continue it. Suspect early  some benefit from the increased Wellbutrin for depression and Luvox for OCD. Tolerating meds. No complaints about the meds. Sleeping and eating well.  No new health concerns.  12/09/21 appt noted: Patient received Spravato 84 mg today.  She tolerated it well without any unusual headache, nausea or vomiting or other somatic symptoms.  Dissociation did occur and she gradually saw resolution over the 2-hour period of observation.  She does not typically find the dissociation very strong. No SE complaints with meds. Seeing noticeable improvement from  increase fluvoxamine to 400 mg daily.  Tolerating meds without concerns over them. Depression is stable with residual sx of easy guilt and easily stressed.  OCD contributes to depression but depression is not severe with less crying spells.  Productive at home with chores.  Enjoyed recent birthday.  Sleeping good. No new concerns.  12/23/2021 appointment noted: Patient received Spravato 84 mg  today.  She tolerated it well without any unusual headache, nausea or vomiting or other somatic symptoms.  Dissociation did occur and she gradually saw resolution over the 2-hour period of observation.  She does not typically find the dissociation very strong. No SE complaints with meds. Seeing noticeable improvement from increase fluvoxamine to 400 mg daily.  Tolerating meds without concerns over them. Her depression is somewhat improved with the Spravato.  She also feels generally a little lighter.  She is more motivated.  She is less overwhelmed by guilt.  The OCD is gradually improving but is still quite time-consuming as noted before.  She is sleeping well.  No side effects  12/30/2021 appointment with the following noted: Patient received Spravato 84 mg today.  She tolerated it well without any unusual headache, nausea or vomiting or other somatic symptoms.  Dissociation did occur and she gradually saw resolution over the 2-hour period of observation.  She does not typically find the dissociation very strong. No SE complaints with meds. Seeing noticeable improvement from increase fluvoxamine to 400 mg daily.  Tolerating meds without concerns over them. She is confident of her the improvement seen with Spravato.  She is less hopeless.  Guilt is marked remarkably improved.  She is not having any thoughts of death or dying.  She is more motivated for activities such as exercise which she is recently started.  She is sleeping well. The OCD remains severe but it is improving somewhat with the increase in fluvoxamine.  It is still consuming a couple hours per day.  01/10/22 apravato 84 admin  01/24/22 appt noted: Patient received Spravato 84 mg today.  She tolerated it well without any unusual headache, nausea or vomiting or other somatic symptoms.  Dissociation did occur and she gradually saw resolution over the 2-hour period of observation.  She does not typically find the dissociation very strong. No  SE complaints with meds. Very tearful today.  Feels like she has been suppressing emotion in the Spravato caused it to be released.  Discussed some stressors.  Overall still feels the medicine is helpful.  She has missed some of the scheduled Spravato treatments that were intended to be weekly due to circumstances beyond her control.  She is still struggling with OCD as previously noted but does believe the medications are helpful. Plan no med changes  01/31/2022 received Spravato 84 mg today  02/09/2022 appointment with the following noted: Patient received Spravato 84 mg today.  She tolerated it well without any unusual headache, nausea or vomiting or other somatic symptoms.  Dissociation did occur and she gradually saw resolution over the 2-hour period of observation.  She does not typically find the dissociation very strong. No SE complaints with meds. Spravato clearly helps depression and OCD but easily gets overwhelmed and tearful with fairly routine stressors.  Tolerating meds. Sleep and appetite is OK Asks to increase lorazepam to 2 mg AM and HS and 67m afternoon  02/16/22 appt noted: Patient received Spravato 84 mg today.  She tolerated it well without any unusual headache, nausea or vomiting or other somatic symptoms.  Dissociation did  occur and she gradually saw resolution over the 2-hour period of observation.  She does not typically find the dissociation very strong. No SE complaints with meds. She has chronic depesssion and OCD but is improved with Spravato, both dx versus before.  She has continued Luvox 400 mg and Wellbutrin 450 mg and is tolerating it.  Chronically easily stressed.  Tolerating all meds.  Doesn't like taking more meds.  Spending a couple hours daily with OCD.  No SI No med changes.  02/21/22 appt noted:   Doesn't like taking more meds.  Spending a couple hours daily with OCD.  No SI No med changes.  02/21/22 appt noted: Patient received Spravato 84 mg today.  She  tolerated it well without any unusual headache, nausea or vomiting or other somatic symptoms.  Dissociation did occur and she gradually saw resolution over the 2-hour period of observation.  She does not typically find the dissociation very strong. No SE complaints with meds. She has chronic depesssion and OCD but is improved with Spravato, both dx versus before.  She has continued Luvox 400 mg and Wellbutrin 450 mg and is tolerating it.  Chronically easily overwhelmed and doesn't know why.  Tolerating all meds. Wants to continue meds.  03/16/22 appt noted: Patient received Spravato 84 mg today.  She tolerated it well without any unusual headache, nausea or vomiting or other somatic symptoms.  Dissociation did occur and she gradually saw resolution over the 2-hour period of observation.  She does not typically find the dissociation very strong. No SE complaints with meds. Overall she still feels the Spravato has been helpful not only for her depression but also for her OCD which was somewhat unexpected.  OCD is still significant but it is less severe than prior to starting Spravato.  She is tolerating Luvox 400 mg and Wellbutrin 450 mg.  We discussed possible med adjustments.  03/23/22 appt noted: Patient received Spravato 84 mg today.  She tolerated it well without any unusual headache, nausea or vomiting or other somatic symptoms.  Dissociation did occur and she gradually saw resolution over the 2-hour period of observation.  She does not typically find the dissociation very strong. No SE complaints with meds. She is still depressed and still has OCD of course but is improved with the Spravato.  She is tolerating the medications well.  We had previously discussed the possibility of switching some of the Wellbutrin to [name] and she is very interested in that in hopes of further improvement in depression and OCD.  She understands that Auvelity is not used for OCD on the label.  She is tolerating the  medications.  She is still easily overwhelmed.  She is sleeping and eating okay.. Plan: Reduce Wellbutrin XL to 300 mg AM and add Auvelity 1 tablet each AM  03/30/22 appt noted: Patient received Spravato 84 mg today.  She tolerated it well without any unusual headache, nausea or vomiting or other somatic symptoms.  Dissociation did occur and she gradually saw resolution over the 2-hour period of observation.  She does not typically find the dissociation very strong. No SE complaints with meds. She is still depressed and still has OCD of course but is improved with the Spravato.  She is tolerating the medications well.  No difference with Auvelity 1 AM so far and no SE.  Going on vacation on Saturday. 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 Sleep and appetite good. Plan: Increase Auvelity to 1 twice daily and reduce Wellbutrin  to XL 150 every morning  04/14/2022 appointment with the following noted: Patient received Spravato 84 mg today.  She tolerated it well without any unusual headache, nausea or vomiting or other somatic symptoms.  Dissociation did occur and she gradually saw resolution over the 2-hour period of observation.  She does not typically find the dissociation very strong. No SE complaints with meds. She is still depressed and still has OCD of course but is improved with the Spravato.  She is tolerating the medications well.  Just increased Auvelity to BID yesterday and reduced Wellbutrin to 150 AM. No SE so far.  No change in mood or anxiety so far.  Chronic OCD as noted and residual depression and chronic fatigue.  04/21/2022 appointment with the following noted: Patient received Spravato 84 mg today.  She tolerated it well without any unusual headache, nausea or vomiting or other somatic symptoms.  Dissociation did occur and she gradually saw resolution over the 2-hour period of observation.  She does not typically find the dissociation very strong. No SE complaints with  meds. She is still depressed and still has OCD of course but is improved with the Spravato.   She has questions about the dosing of lorazepam. She tends to have negative anxious thoughts at night.  This tends to interfere with her ability to go to sleep.  She is getting about 8 to 9 hours of sleep.  She is tolerating the meds without excessive sedation and does not nap during the day.  05/06/22 appt noted: Patient received Spravato 84 mg today.  She tolerated it well without any unusual headache, nausea or vomiting or other somatic symptoms.  Dissociation did occur and she gradually saw resolution over the 2-hour period of observation.  She does not typically find the dissociation very strong. No SE complaints with meds. She is still depressed and still has OCD of course but is improved with the Spravato.   Had some questions about timing of dosing of fluvoxamine and Auvelity. OCD is not quite as time consuming.  Sleep and eating are the same.   No SE meds.  05/25/22 appt noted: Patient received Spravato 84 mg today.  She tolerated it well without any unusual headache, nausea or vomiting or other somatic symptoms.  Dissociation did occur and she gradually saw resolution over the 2-hour period of observation.  She does not typically find the dissociation very strong. No SE complaints with meds. She is still depressed and still has OCD of course but is improved with the Spravato.   She has less OCD when away from home and on vacation of note. Plan: Rec gradually reduce HS lorazepam to 1 mg Hs.  Can continue lorazepam 2 mg AM and 1 mg in afternoon bc of chronic anxiety and it is helpful and tolerated. She can continue temazepam 30 mg nightly.  She tends to have a lot of anxious negative thoughts at night when she is trying to go to bed  06/16/22 appt noted: Patient received Spravato 84 mg today.  She tolerated it well without any unusual headache, nausea or vomiting or other somatic symptoms.   Dissociation did occur and she gradually saw resolution over the 2-hour period of observation.  She does not typically find the dissociation very strong. No SE complaints with meds. She is still depressed and still has OCD of course but is improved with the Spravato.   She has less OCD when away from home and on vacation of note. She is tolerating  the medications.  She has continued current medications. 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  She still receives benefit from [place].  Her depression and OCD are better with the Spravato.  She thinks she is getting additional benefit with the switch from Wellbutrin to [place].  07/04/2022 appointment noted: Reports she developed a rash on her face from [facility] and feels like she is allergic to it.  She stopped it and went back to Wellbutrin 450 mg every morning.  The rash has cleared up.  She did not require any medical attention and did not have shortness of breath. Overall her depression and OCD are about the same as they have been.  She did not notice a substantial difference from the brief treatment with Auvelity but she understands she did not take a full course.  She is tolerating the current medicines well. Current meds fluvoxamine 400 mg daily, Wellbutrin XL 450 mg daily, lorazepam 1 to 2 mg in the morning and 1 to 2 mg at night and 1 mg in the afternoon, temazepam 30 mg nightly. She wants to continue the Spravato because she feels it has been helpful for both her depression and her racing OCD  07/18/22 appt noted: Patient received Spravato 84 mg today.  She tolerated it well without any unusual headache, nausea or vomiting or other somatic symptoms.   Dissociation did occur and she gradually saw resolution over the 2-hour period of observation.  She does not typically find the dissociation very strong. No SE complaints with meds. She is still depressed and still has OCD of course but is improved with the Spravato.  Rash better off Auvelity and back on Welllbutrin XL 450 mg AM, fluvoxamine 400 mg daily.  08/15/22 appt noted: Current psych meds: Wellbutrin XL 450 mg AM, fluvoxamine 100 mg in AM and 300 mg HS, lorazepam 1 mg 1-2 mg in the AM and HS and 1 tablet prn midday for anxiety, temazepam 30 mg HS Patient received Spravato 84 mg today.  She tolerated it well without any unusual headache, nausea or vomiting or other somatic symptoms.  Dissociation did occur and she gradually saw resolution over the 2-hour period of observation.  She does not typically find the dissociation very strong. No SE complaints with meds. She has a great deal of stress dealing with her family.  Disc brother's ongoing mania and difficulty getting him help and the stress he causes for the family. She wants to continue Spravato through this very stressful holdicay season and reevaluate the frequency after the New Year.  Previous psych med trials include Prozac, paroxetine, sertraline, fluvoxamine, venlafaxine, Anafranil with no response,  Wellbutrin, , Viibryd, Trintellix 10 1 month NR Geodon,  risperidone, Rexulti, Abilify,  Seroquel, Latuda 40 mg with irritability.  lamotrigine lithium,  BuSpar, Namenda,  pramipexole with no response, and Topamax, pindolol  ECT-MADRS    [place] Office Visit from 06/29/2021 in [facility] Total Score 36      Flowsheet Row Admission (Discharged) from 06/11/2021 in [facility] No Risk        Review of Systems:  Review of Systems  Constitutional:  Positive for fatigue.  Cardiovascular:  Negative for chest pain and palpitations.  Musculoskeletal:  Positive  for arthralgias, back pain, gait problem and neck pain. Negative for joint swelling.  Neurological:  Positive for weakness. Negative for tremors.  Psychiatric/Behavioral:  Positive for dysphoric mood. Negative for suicidal ideas. The patient is nervous/anxious.     Medications: I have reviewed the patient's current medications.  Current Outpatient Medications  Medication Sig Dispense Refill   Abaloparatide (TYMLOS) 3120 MCG/1.56ML SOPN Inject into the skin.     Azelastine-Fluticasone 137-50 MCG/ACT SUSP Place 1-2 sprays into both nostrils daily.     baclofen (LIORESAL) 10 MG tablet Take 20 mg by mouth at bedtime as needed for muscle spasms.     buPROPion (WELLBUTRIN XL) 150 MG 24 hr tablet TAKE 3 TABLETS BY MOUTH DAILY (Patient taking differently: Take 450 mg by mouth daily.) 270 tablet 0   dicyclomine (BENTYL) 10 MG capsule Take 10 mg by mouth daily.     docusate sodium (COLACE) 100 MG capsule Take 1 capsule (100 mg total) by mouth 2 (two) times daily. (Patient taking differently: Take 100 mg by mouth daily.) 10 capsule 0   Esketamine HCl, 84 MG Dose, (SPRAVATO, 84 MG DOSE,) 28 MG/DEVICE SOPK USE 3 SPRAYS IN EACH NOSTRIL EVERY OTHER WEEK 3 each 3   fexofenadine (ALLEGRA) 180 MG tablet Take 180 mg by mouth daily.     hydrocortisone (ANUSOL-HC) 2.5 % rectal cream Place rectally 2 (two) times daily. x 7-14 days 30 g 0   ketotifen (ZADITOR) 0.025 % ophthalmic solution Place 3 drops into both eyes at bedtime.     LORazepam (ATIVAN) 1 MG tablet TAKE 1-2 IN THE AM AND 1-2 TABLETS EVERY NIGHT AT BEDTIME AND 1 TABLET IN AFTERNOON when needed for anxiety and sleep 150 tablet 1   magnesium gluconate (MAGONATE) 500 MG tablet Take 500 mg by mouth daily.     MIBELAS 24 FE 1-20 MG-MCG(24) CHEW Chew 1 tablet by mouth at bedtime as needed (bowel regularity).     Multiple Vitamins-Minerals (ADULT GUMMY PO) Take 2 tablets by mouth in the morning.     nitrofurantoin (MACRODANTIN) 100 MG capsule Take 100 mg by  mouth as needed (For urinary tract infection.).      oxyCODONE-acetaminophen (PERCOCET/ROXICET) 5-325 MG tablet Take 1-2 tablets by mouth every 6 (six) hours as needed for severe pain. 50 tablet 0   polyethylene glycol (MIRALAX / GLYCOLAX) packet Take 17 g by mouth daily as needed for mild constipation. 14 each 0   psyllium (METAMUCIL) 58.6 % powder Take 1 packet by mouth daily as needed (constipation).     temazepam (RESTORIL) 30 MG capsule TAKE 1 CAPSULE BY MOUTH AT BEDTIME AS NEEDED FOR SLEEP. 30 capsule 2   Vitamin D-Vitamin K (VITAMIN K2-VITAMIN D3 PO) Take 1-2 sprays by mouth daily.     fluvoxaMINE (LUVOX) 100 MG tablet 1 tablet in the AM and 3 tablets in the evening 360 tablet 0   No current facility-administered medications for this visit.    Medication Side Effects: None   Allergies:  Allergies  Allergen Reactions   Hydrocodone Itching   Sulfamethoxazole-Trimethoprim Itching   Dust Mite Extract Other (See Comments)    Sneezing, watery eyes, runny nose   Latex Itching   Other Other (See Comments)    PT IS ALLERGIC TO CAT DANDER AND RAGWEED - Sneezing, watery eyes, runny nose    Pollen Extract Other (See Comments)    Sneezing, watery eyes, runny  nose     Past Medical History:  Diagnosis Date   Abnormal Pap smear 2011   hpv/mild dysplasia,cin1   Anxiety    Cerebral palsy (HCC)    right arm/leg   Cystocele    Depression    Headache    Neuromuscular disorder (HCC)    Cerebral Palsy   OCD (obsessive compulsive disorder)    Osteoporosis    Uterine prolaps     Family History  Problem Relation Age of Onset   Cancer Father        skin AND LUNG   Alcohol abuse Sister        CRACK COCAINE    Social History   Socioeconomic History   Marital status: Married    Spouse name: Not on file   Number of children: Not on file   Years of education: Not on file   Highest education level: Not on file  Occupational History   Not on file  Tobacco Use   Smoking status:  Never   Smokeless tobacco: Never  Substance and Sexual Activity   Alcohol use: Not Currently    Comment: OCCASIONAL beer   Drug use: No   Sexual activity: Yes    Birth control/protection: Pill    Comment: LOESTRIN 24 FE  Other Topics Concern   Not on file  Social History Narrative   Not on file   Social Determinants of Health   Financial Resource Strain: Not on file  Food Insecurity: Not on file  Transportation Needs: Not on file  Physical Activity: Not on file  Stress: Not on file  Social Connections: Not on file  Intimate Partner Violence: Not on file    Past Medical History, Surgical history, Social history, and Family history were reviewed and updated as appropriate.   Please see review of systems for further details on the patient's review from today.   Objective:   Physical Exam:  LMP  (LMP Unknown)   Physical Exam Constitutional:      General: She is not in acute distress. Neurological:     Mental Status: She is alert and oriented to person, place, and time.     Cranial Nerves: No dysarthria.     Motor: Weakness present.     Gait: Gait abnormal.  Psychiatric:        Attention and Perception: Attention and perception normal.        Mood and Affect: Mood is anxious and depressed. Affect is not labile.        Speech: Speech normal.        Behavior: Behavior normal. Behavior is not slowed. Behavior is cooperative.        Thought Content: Thought content normal. Thought content is not delusional. Thought content does not include homicidal or suicidal ideation. Thought content does not include suicidal plan.        Cognition and Memory: Cognition and memory normal. Cognition is not impaired.        Judgment: Judgment normal.     Comments: Insight intact Ongoing OCD remains fairly severe but less anxious Checking compulsions up to 2 hours daily but improved noticeably Chronic depression persistent but better with Spravato Still can be easily upset      Lab  Review:     Component Value Date/Time   NA 138 06/11/2021 0606   K 4.0 06/11/2021 0606   CL 107 06/11/2021 0606   CO2 26 06/11/2021 0606   GLUCOSE 90 06/11/2021 0606   BUN 18  06/11/2021 0606   CREATININE 0.81 06/11/2021 0606   CALCIUM 9.4 06/11/2021 0606   PROT 6.5 06/11/2021 0606   ALBUMIN 3.3 (L) 06/11/2021 0606   AST 17 06/11/2021 0606   ALT 14 06/11/2021 0606   ALKPHOS 141 (H) 06/11/2021 0606   BILITOT 0.2 (L) 06/11/2021 0606   GFRNONAA >60 06/11/2021 0606   GFRAA >60 07/09/2016 0438       Component Value Date/Time   WBC 5.8 06/11/2021 0606   RBC 4.12 06/11/2021 0606   HGB 12.5 06/11/2021 0606   HCT 39.7 06/11/2021 0606   PLT 299 06/11/2021 0606   MCV 96.4 06/11/2021 0606   MCH 30.3 06/11/2021 0606   MCHC 31.5 06/11/2021 0606   RDW 13.9 06/11/2021 0606   LYMPHSABS 1.9 06/11/2021 0606   MONOABS 0.5 06/11/2021 0606   EOSABS 0.1 06/11/2021 0606   BASOSABS 0.0 06/11/2021 0606    No results found for: "POCLITH", "LITHIUM"   No results found for: "PHENYTOIN", "PHENOBARB", "VALPROATE", "CBMZ"   .res Assessment: Plan:    Tyah was seen today for follow-up, depression, anxiety and fatigue.  Diagnoses and all orders for this visit:  Recurrent major depression resistant to treatment ([place]) -     fluvoxaMINE (LUVOX) 100 MG tablet; 1 tablet in the AM and 3 tablets in the evening  Mixed obsessional thoughts and acts -     fluvoxaMINE (LUVOX) 100 MG tablet; 1 tablet in the AM and 3 tablets in the evening  Social anxiety disorder  Insomnia due to mental condition    Both Dx are TR and marked.  Impaired function but less so with Spravato re: depression..   She is receiving Spravato 84 mg weekly and marked improvement in the depression..  she feels it also helps OCD somewhat.  However still easily overwhelmed with low stress tolerance.  The OCD is improved with the increase in fluvoxamine and with Spravato.  Spends 2 hours daily and checking compulsions on her worst days  but better when she travels.  She has been on higher doses of fluvoxamine above the usual max of 400 mg daily in the past.  This became difficult to obtain at 1 point and the dose was reduced to 300 mg daily.   Disc SE. She would like to continue Luvox 400 mg daily again    She is tolerating the meds well  Continue Increased Luvox back to 400 mg nightly as of January 2023. Disc dosing higher than usual.  She feels this is increase has helped more with OCD which remains chronically severe.  Rash better off Auvelity Per her request continue Wellbutrin XL 450 mg every morning.  Disc Spravato DT TRD incl details and SE. Disc dosing and duration.  Pt with severe depression MADRS 36 on 06/29/21  Patient was administered Spravato 84 mg intranasally dosage today.  The patient experienced the typical dissociation which gradually resolved over the 2-hour period of observation.  There were no complications.  Specifically the patient did not have nausea or vomiting or headache.  Blood pressures remained within normal ranges at the 40-minute and 2-hour follow-up intervals.  By the time the 2-hour observation period was met the patient was alert and oriented and able to exit without assistance. She tends to have lingering sedative effects but not severe. .  See nursing note for further details. Per protocol will continue Spravato to 84 mg weekly.  She has been okay since cutting back to once weekly.  We discussed the short-term risks associated  with benzodiazepines including sedation and increased fall risk among others.  Discussed long-term side effect risk including dependence, potential withdrawal symptoms, and the potential eventual dose-related risk of dementia.  But recent studies from 2020 dispute this association between benzodiazepines and dementia risk. Newer studies in 2020 do not support an association with dementia. Disc this is high dose and not ideal.  Also disc risk combining it with  temazepam. Rec gradually reduce HS lorazepam to 1 mg Hs.  Can continue lorazepam 2 mg AM and 1 mg in afternoon bc of chronic anxiety and it is helpful and tolerated. She can continue temazepam 30 mg nightly.  She tends to have a lot of anxious negative thoughts at night when she is trying to go to bed.  She is trying to reduce the dose.  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  Supportive therapy dealing with some of the recent stressors including family stressors and her own health related to cerebral palsy   Follow-up weekly .    Lynder Parents, MD, DFAPA  Please see After Visit Summary for patient specific instructions.  Future Appointments  Date Time Provider [place]  08/30/2022 11:00 AM Blanchie Serve, PhD CP-CP None  09/13/2022 11:00 AM Blanchie Serve, PhD CP-CP None    No orders of the defined types were placed in this encounter.    -------------------------------

## 2022-08-16 NOTE — Progress Notes (Signed)
Psychotherapy Progress Note [facility] [name], PhD LP  Patient ID: Casey Diaz ([name])    MRN: [id number] Therapy format: Individual psychotherapy Date: 08/16/2022      Start: 11:25a     Stop: 12:08p     Time Spent: 43 min Location: In-person   Session narrative (presenting needs, interim history, self-report of stressors and symptoms, applications of prior therapy, status changes, and interventions made in session) Spravato yesterday, was telling [name] she thought she's doing well enough to come off, but then [name] has been so dysfunctional, manic, walks a lot, adding stress.  Frustrated not getting in touch with his treatment team, given referral by [name] to Dr. Lucianne Muss, head of service at [facility].  A check of his to M bounced, he's going to bars after work, and he's been getting belligerent, not exactly combative, but 2-5 hrs sleep.  Also stealing from neighborhood libraries to [employer] at a used book store.  Strong suspicion he's driving drunk on the e-bike, and smokes a lot.  Knows he walks every morning but is very overweight, worries about him bringing on a heart attack.  Discussed avenues and efforts to get him better treated, boundaries and standards with helping him out and working with mother's care.    Fears holidays will not work out, and B Ed's distancing will further disintegrate the family.  Feels put upon by him in particular, again resentful of him not helping more.  Clarified values and messages.  Therapeutic modalities: Cognitive Behavioral Therapy, Solution-Oriented/Positive Psychology, and Assertiveness/Communication  Mental Status/Observations:  Appearance:   Casual     Behavior:  Appropriate  Motor:  Normal  Speech/Language:   Clear and Coherent  Affect:  Appropriate  Mood:  stressed  Thought process:  normal  Thought content:    WNL  Sensory/Perceptual disturbances:    WNL  Orientation:  Fully oriented  Attention:   Good    Concentration:  Good  Memory:  WNL  Insight:    Good  Judgment:   Good  Impulse Control:  Good   Risk Assessment: Danger to Self: No Self-injurious Behavior: No Danger to Others: No Physical Aggression / Violence: No Duty to Warn: No Access to Firearms a concern: No  Assessment of progress:  progressing  Diagnosis:   ICD-10-CM   1. 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 Caregiver stress  Z63.6     3. Relationship problem with family members  Z63.8     4. Mixed obsessional thoughts and acts  F42.2     5. Social anxiety disorder  F40.10     6. 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HCC)  G80.9      Plan:  Family assistance -- Self-affirm that wanting to help is not dysfunctional, all judgment calls.  Option Al-Anon for support and boundary help.  Endorse connecting [place] to further help, either through [facility] or [facility], and recruiting family members into necessary confrontation. Relationship with H -- Open to join.  Consider addressing H's overinterpretation of "choosing" FOO over FOI/him and perceived negativity toward [place]. Consider further willingness to challenge sexual habits on grounds of feeling left out, ask for H to confer re sexual expectations rather than simply accede to demands. Self-care -- Continue efforts to engage exercise, part-time work, and supportive relationship outside the home Parenting -- Continue appropriate efforts to shape [name]'s socializing and responsibility to clean up after himself, e.g., "after you ___, then you  can ___".  Re. perceived loss of close relationship, self-affirm that she always wanted to create a "buddy" but any adolescent boy still distances and may go through a sullen, isolative period. Depressive thinking -- Generally, look for thought patterns of shaming self irrationally and collecting troubles to the point of wanting to leave town -- both are distress-making and may interfere with applying interpersonal  effectiveness principles. Other recommendations/advice as may be noted above Continue to utilize previously learned skills ad lib Maintain medication as prescribed and work faithfully with relevant prescriber(s) if any changes are desired or seem indicated Call the clinic on-call service, 988/hotline, 911, or present to [facility] or ER if any life-threatening psychiatric crisis Return for as already scheduled, time as available. Already scheduled visit in this office 08/30/2022.  Blanchie Serve, PhD Luan Moore, PhD LP Clinical Psychologist, [facility] [address] [place], [place] [address] [phone]

## 2022-08-21 NOTE — Progress Notes (Signed)
NURSE Visit:   Pt arrived for her weekly Spravato Treatment, she started Spravato treatments on 10/07/2021, she continues with 84 mg (3 of the 28 mg) Spravato nasal spray once a week treatments, which is the maintenance dose for her treatment resistant depression. This is treatment #40.   Pt has been unable to come in for her treatments due to several things occurring. It has been about 3 weeks since last treatment. She was directed to the treatment room to get vitals taken first. Initial vital signs are B/P at 3:20 PM 115/80, 87, Pt instructed to blow her nose and to recline back at 45 degrees. Pt given first nasal spray (28 mg) administered by pt observed by nurse. Pt always has to go to the restroom after her first dose, at that point she is not too medicated to walk to bathroom safely.There were 5 minutes between each dose, total of 84 mg. Tolerated well. Pt's medication is delivered by [facility] in [place] and stored inside a safe behind a locked door as well. Spravato is a CIII medication and has to be only given at a treatment facility and observed by nurse as pt administered intranasally.  Pt's 40 minute vital signs at 4:05 PM 113/78, 84. Dr. Clovis Pu met with pt to discuss her care at the end of her treatment when her thoughts are clearer and they discussed her medication and moods. She does go to the bathroom at least once during her treatment and again towards the end when she is clearer and able to walk. No sedation and had slight feeling of being "high" she reports. Discharge vitals at 5:20 PM 128/77, 86. Pt stable for discharge.  Pt was observed on site a total of 120 minutes per FDA/REMS requirements. Pt was with nurse for clinical assessment 50 minutes. Pt will start coming every   [id number]   EXP AUG 2025

## 2022-08-30 ENCOUNTER — Ambulatory Visit (INDEPENDENT_AMBULATORY_CARE_PROVIDER_SITE_OTHER): Payer: 59 | Admitting: Psychiatry

## 2022-08-30 DIAGNOSIS — Z638 Other specified problems related to primary support group: Secondary | ICD-10-CM

## 2022-08-30 DIAGNOSIS — F4010 Social phobia, unspecified: Secondary | ICD-10-CM | POA: Diagnosis not present

## 2022-08-30 DIAGNOSIS — F339 Major depressive disorder, recurrent, unspecified: Secondary | ICD-10-CM | POA: Diagnosis not present

## 2022-08-30 DIAGNOSIS — Z636 Dependent relative needing care at home: Secondary | ICD-10-CM

## 2022-08-30 DIAGNOSIS — F422 Mixed obsessional thoughts and acts: Secondary | ICD-10-CM

## 2022-08-30 NOTE — Progress Notes (Signed)
Psychotherapy Progress Note [facility] [name], PhD LP  Patient ID: Casey Diaz (Casey "[name]")    MRN: [id number] Therapy format: Individual psychotherapy Date: 08/30/2022      Start: 11:09a     Stop: 11:56p     Time Spent: 47 min Location: In-person   Session narrative (presenting needs, interim history, self-report of stressors and symptoms, applications of prior therapy, status changes, and interventions made in session) Working on dental needs now, may have to get another gum graft, which stands to be very expensive and painful.  Occupied today with new crisis with mom, whose car needs expensive transmission work, and whose Curator took her aside about mom's tendency to procrastinate paying her bills.  Assertively approached [name] about underwriting the current bill, sparing details and just keeping it businesslike, it sounds.  Affirmed the way she did it, but hard to hear, since she goes on to mention all her hardships -- Casey Diaz's autism and resistance to chores, Casey Diaz's risk of being upset about her helping family, Casey Diaz's mania, definitely Casey Diaz's resentment and protests about being hit up for help with mother, mother's tearfulness since her stroke, sister Casey Diaz's continuing poverty and news that her rent will go up $175 -- and Casey Diaz tearfully wishing she had never been born.  Recovered in a couple minutes, went on about issues, largely settled down to the expenses issue and how to play it with her husband.  Advocated early and honest in letting him know and possibility of establishing a budget for family help.  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    Affirmed and encouraged in regaining perspective, working the various problems, job-seeking, and preparing to communicate with family members as needed.  Discussed communication tips with H as most crucial.  Therapeutic modalities: Cognitive Behavioral Therapy, Solution-Oriented/Positive  Psychology, and Ego-Supportive  Mental Status/Observations:  Appearance:   Casual     Behavior:  Appropriate  Motor:  Normal  Speech/Language:   Clear and Coherent  Affect:  Appropriate  Mood:  stressed  Thought process:  normal  Thought content:    WNL  Sensory/Perceptual disturbances:    WNL  Orientation:  Fully oriented  Attention:  Good    Concentration:  Good  Memory:  WNL  Insight:    Good  Judgment:   Good  Impulse Control:  Good   Risk Assessment: Danger to Self: No Self-injurious Behavior: No Danger to Others: No Physical Aggression / Violence: No Duty to Warn: No Access to Firearms a concern: No  Assessment of progress:  stabilized  Diagnosis:   ICD-10-CM   1. 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 Caregiver stress  Z63.6     3. Relationship problem with family members  Z63.8     4. Social anxiety disorder  F40.10     5. Mixed obsessional thoughts and acts  F42.2      Plan:  Family assistance -- Self-affirm that wanting to help is not dysfunctional, all judgment calls.  Option Al-Anon for support and boundary help.  Endorse connecting [place] to further help, either through [facility] or [facility], and recruiting family members into necessary confrontation. Relationship with H -- Open to join.  Consider addressing H's overinterpretation of "choosing" FOO over FOI/him and perceived negativity toward [place]. Consider further willingness to challenge sexual habits on grounds of feeling left out, ask for H to confer re sexual expectations rather than  simply accede to demands. Self-care -- Continue efforts to engage exercise, part-time work, and supportive relationship outside the home.  Notice and resist chunking troubles together Parenting -- Continue appropriate efforts to shape Casey Diaz's socializing and responsibility to clean up after himself, e.g., "after you ___, then you can ___".  Re. perceived loss of close relationship, self-affirm that  she always wanted to create a "buddy" but any adolescent boy still distances and may go through a sullen, isolative period. Depressive thinking -- Generally, look for thought patterns of shaming self irrationally and collecting troubles to the point of wanting to leave town -- both are distress-making and may interfere with applying interpersonal effectiveness principles. Intrusive thoughts and compulsions -- seems to be lower priority at this stage, but when present, seek to press on without checking corners and spaces for imaginary abused children Other recommendations/advice as may be noted above Continue to utilize previously learned skills ad lib Maintain medication as prescribed and work faithfully with relevant prescriber(s) if any changes are desired or seem indicated Call the clinic on-call service, 988/hotline, 911, or present to [facility] or ER if any life-threatening psychiatric crisis Return for as already scheduled. Already scheduled visit in this office 09/13/2022.  Robley Fries, PhD [name], PhD LP Clinical Psychologist, [facility] [address], [address] [place], [place] [address] [phone]

## 2022-09-05 ENCOUNTER — Ambulatory Visit (INDEPENDENT_AMBULATORY_CARE_PROVIDER_SITE_OTHER): Payer: 59 | Admitting: Psychiatry

## 2022-09-05 ENCOUNTER — Ambulatory Visit: Payer: 59

## 2022-09-05 VITALS — BP 108/91 | HR 74

## 2022-09-05 DIAGNOSIS — F339 Major depressive disorder, recurrent, unspecified: Secondary | ICD-10-CM

## 2022-09-05 DIAGNOSIS — G43009 Migraine without aura, not intractable, without status migrainosus: Secondary | ICD-10-CM

## 2022-09-05 DIAGNOSIS — F422 Mixed obsessional thoughts and acts: Secondary | ICD-10-CM | POA: Diagnosis not present

## 2022-09-05 DIAGNOSIS — F4010 Social phobia, unspecified: Secondary | ICD-10-CM | POA: Diagnosis not present

## 2022-09-05 DIAGNOSIS — F5105 Insomnia due to other mental disorder: Secondary | ICD-10-CM

## 2022-09-07 ENCOUNTER — Telehealth: Payer: Self-pay

## 2022-09-07 NOTE — Telephone Encounter (Signed)
Prior Authorization submitted for SPRAVATO 84 MG with [facility], this is her renewal for continued treatment. Pt is coming every other week.  Pt approval received effective 08/08/2022-09/07/2023, PA# [id number]

## 2022-09-12 ENCOUNTER — Telehealth: Payer: Self-pay | Admitting: Psychiatry

## 2022-09-12 ENCOUNTER — Encounter: Payer: Self-pay | Admitting: Psychiatry

## 2022-09-12 NOTE — Progress Notes (Signed)
Casey Diaz [id number] 09-Jan-1966 56 y.o.    Subjective:   Patient ID:  Casey Diaz is a 56 y.o. (DOB Dec 07, 1965) female.  Chief Complaint:  Chief Complaint  Patient presents with   Follow-up   Depression   Anxiety   Fatigue     HPI Casey Diaz presents to the office today for follow-up of OCD and severe anxiety.     December 2019 visit the following was noted: No meds were changed. Lives in [place] and back for followup.  Sx are about the same.  Has to take meds with different sizes. Pt reports that mood is Anxious and Depressed and describes anxiety as Severe. Anxiety symptoms include: Excessive Worry, Obsessive Compulsive Symptoms:   Checking,,. Pt reports has interrupted sleep and nocturia. Pt reports that appetite is good. Pt reports that energy is no change and down slightly. Concentration is down slightly. Suicidal thoughts:  denied by patient. Loves the environment of [place] but misses some things there.  She's not able to work there.  H works there and likes it.  Struggled with not working, feels isolated and not up to task of meeting people.  Does attend a church and met a friend who's been helpful.  Leaving for [place] on 10/16/18.   04/09/2020 appointment the following is noted:  Staying another year in [place] bc Covid and other things. Last few months a lot of crying spells.  Is in menopause. Wonders about med changes though is nervous about it.  Crying spells associated with depressing thoughts more than stress or OCD.   Covid really hard on everyone and couldn't see family for 18 mos.  Family still very dysfunctional. No close friends in part due to OCD and depression. Son high Autism spectrum with ADHD and anxiety and she's with him all the time. Greater health problems with CP so more pains.   05/15/20 appt with the following noted: [name] for menopause and helps some. Still depressed.  Chronically. In [place] for 2 more weeks then to [place] for  another year. A lot of stressors lately triggering more checking and anxiety.   OCD is her CC now and seems.  Got worse DT stress.   Stressed with Asberger's son and her health.  H works a lot.  Her FOO still stress. Plan: Trintellix 10 mg 1 tablet in the morning with food and reduce fluvoxamine to 5 tablets nightly for 1 week  then reduce it to 4 tablets nightly.   07/02/20 appt with the following noted: Decided not to get Trintellix bc difficulty getting it. It is available.  There.  Wants to start it now.   Both depression and OCD are severe.  Not suicidal in intent or plan. Did not take samples with her to [place] but will be back in December. covid is worse there and travel is difficult.  Wants to reduce Wellbutrin DT dry mouth. Plan: She's afraid to reduce Luvox at this time DT fear of worsening OCD.  But will consider. Trintellix 10 mg 1 tablet in the morning with food and reduce fluvoxamine to 5 tablets nightly for 1 week  then reduce it to 4 tablets nightly. Also reduce Wellbutrin XL to 300 mg daily.    9-13 2022 appointment with the following noted: Back in [place] since July 14.  Broke arm a month ago and surgery.  It's all been rough adjustment.   B has cancer on his face and M fell taking him to the doctor.  Misses the water and weather of [place].   Cry a lot more since menopause. Still depression and anxiety and OCD.  Asks about ketamine. On Wellbutrin 300, Luvox 300.  No Trintellix. Added Ativan 2 mg AM and HS and it helps.  More likely to get upset at night. Plan: Increase Luvox back to 400 mg daily.  She thinks she's worse on less. Continue Wellbutrin XL to 300 mg daily. 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  She tolerated it well without unusual nausea or vomiting headache or other somatic symptoms.  She did have the expected dissociation which gradually resolved over  the course of the 2-hour period of observation.  She was a little concerned about her balance given her cerebral palsy but has not noted unusual or unexpected problems.  She is motivated to can continue Spravato in hopes of reducing her depressive symptoms. 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  She also has treatment resistant OCD which is partially managed with medications but is still quite disabling.  She is tolerating the medications well.  She is sleeping adequately.  Her appetite is adequate.  She is not having suicidal thoughts.  She continues to wish for a better treatment for OCD that would give her some relief.  09/30/2021 appointment with the following noted: She received her first dose of Spravato 84 mg intranasally today.  She tolerated it well without unusual nausea, vomiting, or other somatic symptoms.  Dissociation as expected did occur and gradually resolved over the 2-hour period of observation.  She did have a mild headache today with the treatment and received ibuprofen 600 mg at her request.  We will follow this to see if it is a pattern Patient is still depressed.  She said she was late with her medicine today and today was a particularly depressing day.  However she notes that the Spravato has lifted her mood considerably even today.  She is hopeful that it will continue to be helpful.  No suicidal thoughts.  She has ongoing chronic anxiety and OCD at baseline.  10/04/21 appt noted: Patient received Spravato 84 mg for the second time today.  She tolerated it well without any unusual headache, nausea or vomiting or other somatic symptoms.  Dissociation did occur and she gradually [place] resolution over the 2-hour period of observation. She did not have any unusual problems after she left the office last Spravato administration.  She did not have any specific problems with balance or walking.  She is at increased risk of that difficulty because of  cerebral palsy.  So far she has not noticed much mood effect from the medication beyond the first day of receiving it.  However she would like to continue Spravato in hopes of getting the antidepressant effect that is desired. Stress dealing with mother's behavior at party pt hosted.  Guilt over it.  10/07/2021 appointment noted: Patient received Spravato 84 mg for the second time today.  She tolerated it well without any unusual headache, nausea or vomiting or other somatic symptoms.  Dissociation did occur and she gradually [place] resolution over the 2-hour period of observation. She still is not sure about the antidepressant effect of Spravato.  Events over the holidays and demands, make it difficult to assess.  She still notes that the OCD tends to worsen the depression and vice versa.  She tolerates the  Spravato well and wants to continue the trial.  10/15/2021 appointment with the following noted: Patient received Spravato 84 mg for the second time today.  She tolerated it well without any unusual headache, nausea or vomiting or other somatic symptoms.  Dissociation did occur and she gradually [place] resolution over the 2-hour period of observation. Patient says it was somewhat difficult to evaluate the effect of the Spravato.  It was scheduled to be twice weekly for 4 weeks consecutively but the holidays have interfered with that administration.  She asked what specifically should be she should be looking for in order to assess improvement.  That was discussed.  The OCD is unchanged and the depression so far is not significantly different.  She still tolerates meds.  There have been no recent med changes  10/19/2021 appt noted: Patient received Spravato 84 mg for the second today.  She tolerated it well without any unusual headache, nausea or vomiting or other somatic symptoms.  Dissociation did occur and she gradually saw resolution over the 2-hour period of observation.   10/21/2021 appointment  noted: Patient received Spravato 84 mg today.  She tolerated it well without any unusual headache, nausea or vomiting or other somatic symptoms.  Dissociation did occur and she gradually saw resolution over the 2-hour period of observation.  She feels better than last week.  She is not as depressed and down.  She is still dealing with grief around the death of her cousin that was unexpected.  It is still difficult to tell how much the Spravato was doing but she is hopeful.  Anxiety is still present with the OCD.  She is not having suicidal thoughts.  She is not hopeless.  She wants to continue treatment.  10/25/2021 appointment with the following noted: Patient received Spravato 84 mg today.  She tolerated it well without any unusual headache, nausea or vomiting or other somatic symptoms.  Dissociation did occur and she gradually saw resolution over the 2-hour period of observation.  She does not typically find the dissociation very strong. She is beginning to think the Spravato is helping somewhat with the depression.  It has been difficult to tell with the holidays intervening as well as the death of her cousin.  She has not been able to get Spravato twice weekly for 4 weeks straight as typically planned.  However she is hopeful.  The OCD remains significant.  She still has a tendency to think very negatively.  She is not suicidal.  10/28/2021 appointment with the following noted: Patient received Spravato 84 mg today.  She tolerated it well without any unusual headache, nausea or vomiting or other somatic symptoms.  Dissociation did occur and she gradually saw resolution over the 2-hour period of observation.  She does not typically find the dissociation very strong. She is feeling more hopeful about the administration of Spravato.  She is having less depression she believes.  Still not dramatically different.  She still has a tendency to have a lot of anxiety and rumination and OCD.  She is not suicidal.   She is eager to continue the Spravato.  11/01/2021 appointment with the following noted: Patient received Spravato 84 mg today.  She tolerated it well without any unusual headache, nausea or vomiting or other somatic symptoms.  Dissociation did occur and she gradually saw resolution over the 2-hour period of observation.  She does not typically find the dissociation very strong. She is continuing to see a little bit of improvement in depression  with Spravato.  The anxiety remains but may be not as severe.  The OCD remains markedly severe chronically.  She is not suicidal.  She is encouraged by the degree of improvement with Spravato and inability to enjoy things more and not be quite as ruminative.  11/04/2021 appt noted: Patient received Spravato 84 mg today.  She tolerated it well without any unusual headache, nausea or vomiting or other somatic symptoms.  Dissociation did occur and she gradually saw resolution over the 2-hour period of observation.  She does not typically find the dissociation very strong. No SE complaints with meds. She continues to feel hopeful about the Spravato.  She has less depression.  Because of a number of factors she is uncertain of the full benefit but thinks she is somewhat less depressed.  Her anxiety and OCD remain significant but a little better.  She is tolerating the medications and does not desire medicine change.  She is not currently complaining of insomnia.   11/08/2021 appointment the following noted: Patient received Spravato 84 mg today.  She tolerated it well without any unusual headache, nausea or vomiting or other somatic symptoms.  Dissociation did occur and she gradually saw resolution over the 2-hour period of observation.  She does not typically find the dissociation very strong. No SE complaints with meds. She feels the Spravato is helping somewhat.  She would like to see a greater effect.  However she is able to enjoy things.  She is productive at home.   She would like to see a lifting of a degree of sadness that remains.  The anxiety and OCD remained largely unchanged.  She wondered about the dosing of Wellbutrin 300 mg a day and Luvox 300 mg a day and possible increases.  She has been at higher doses in the past.  She plans to start water therapy for her weakness and for her shoulder.  11/11/2021 appointment with the following noted: Patient received Spravato 84 mg today.  She tolerated it well without any unusual headache, nausea or vomiting or other somatic symptoms.  Dissociation did occur and she gradually saw resolution over the 2-hour period of observation.  She does not typically find the dissociation very strong. No SE complaints with meds. She feels the Spravato is clearly helping the depression.  She would like to see a more significant effect.  She is still having trouble thinking positive. Her energy is fair.  Concentration is good except for the problem with chronic obsessions. She has been taking Wellbutrin 300 mg in Luvox 300 mg for quite some time but has taken higher doses in the past.  We discussed that.  She would like to try higher doses in order to get a better effect if possible. We just increased the doses a couple of days ago.  No effect yet.  11/15/2021 appointment with the following noted: Patient received Spravato 84 mg today.  She tolerated it well without any unusual headache, nausea or vomiting or other somatic symptoms.  Dissociation did occur and she gradually saw resolution over the 2-hour period of observation.  She does not typically find the dissociation very strong. No SE complaints with meds. The patient is now convinced that the Spravato is helping the depression.  She would like to continue twice weekly Spravato this week if possible.  She has tolerated the increase in Wellbutrin to 450 mg daily and the increase and fluvoxamine to 400 mg daily without complications thus far.  The OCD and anxiety feed  the  depression to some extent. She spends approximately 2 hours daily with checking compulsions due to obsessions about causing harm to others.  For example fearing that when she has hit a pot hole that she may have hit a person and going back to check.  Checking corners and rooms out of fear that she may have harmed someone.  Other various checking compulsions.  She is hoping the increase in fluvoxamine to 400 mg will reduce that over the weeks to come.  She is not seeing a significant difference with the addition of the Spravato though she understands that was not expected.  She is more productive at home and more motivated and able to enjoy things more fully as a result of the Spravato treatment.  She is tolerating the medication  11/18/2021 appointment with the following noted: Patient received Spravato 84 mg today.  She tolerated it well without any unusual headache, nausea or vomiting or other somatic symptoms.  Dissociation did occur and she gradually saw resolution over the 2-hour period of observation.  She does not typically find the dissociation very strong. No SE complaints with meds. She clearly believes the Spravato has been helpful for the depression.  She wonders whether to continue to treatments weekly or to cut back to 1 weekly.  She would like to continue twice weekly in hopes of getting additional improvement in the depression because it is not resolved but it is difficult to get here twice a week in terms of arranging rides. She is recently increased Wellbutrin XL to 450 mg daily and fluvoxamine to 400 mg daily but they have not had time to have an official effect.  She is tolerating that well.  She is tolerating meds overwork overall well. The OCD remains the same as noted on 11/15/2021  11/25/21 appt noted: Patient received Spravato 84 mg today.  She tolerated it well without any unusual headache, nausea or vomiting or other somatic symptoms.  Dissociation did occur and she gradually saw  resolution over the 2-hour period of observation.  She does not typically find the dissociation very strong. No SE complaints with meds. She thinks the increase in Wellbutrin and Luvox have been potentially helpful for depression and OCD respectively.  It has been too early to see the full effect.  She is sleeping and eating well.  She is functioning at home.  She still spends a lot of time that is about 2 hours a day dealing with compulsive behaviors.  12/02/21 appt noted: Patient received Spravato 84 mg today.  She tolerated it well without any unusual headache, nausea or vomiting or other somatic symptoms.  Dissociation did occur and she gradually saw resolution over the 2-hour period of observation.  She does not typically find the dissociation very strong. No SE complaints with meds. Several losses and stressors recently that affect her sense of mood. However still sees significant benefit from the Spravato for her depression.  Wants to continue it. Suspect early  some benefit from the increased Wellbutrin for depression and Luvox for OCD. Tolerating meds. No complaints about the meds. Sleeping and eating well.  No new health concerns.  12/09/21 appt noted: Patient received Spravato 84 mg today.  She tolerated it well without any unusual headache, nausea or vomiting or other somatic symptoms.  Dissociation did occur and she gradually saw resolution over the 2-hour period of observation.  She does not typically find the dissociation very strong. No SE complaints with meds. Seeing noticeable improvement from  increase fluvoxamine to 400 mg daily.  Tolerating meds without concerns over them. Depression is stable with residual sx of easy guilt and easily stressed.  OCD contributes to depression but depression is not severe with less crying spells.  Productive at home with chores.  Enjoyed recent birthday.  Sleeping good. No new concerns.  12/23/2021 appointment noted: Patient received Spravato 84 mg  today.  She tolerated it well without any unusual headache, nausea or vomiting or other somatic symptoms.  Dissociation did occur and she gradually saw resolution over the 2-hour period of observation.  She does not typically find the dissociation very strong. No SE complaints with meds. Seeing noticeable improvement from increase fluvoxamine to 400 mg daily.  Tolerating meds without concerns over them. Her depression is somewhat improved with the Spravato.  She also feels generally a little lighter.  She is more motivated.  She is less overwhelmed by guilt.  The OCD is gradually improving but is still quite time-consuming as noted before.  She is sleeping well.  No side effects  12/30/2021 appointment with the following noted: Patient received Spravato 84 mg today.  She tolerated it well without any unusual headache, nausea or vomiting or other somatic symptoms.  Dissociation did occur and she gradually saw resolution over the 2-hour period of observation.  She does not typically find the dissociation very strong. No SE complaints with meds. Seeing noticeable improvement from increase fluvoxamine to 400 mg daily.  Tolerating meds without concerns over them. She is confident of her the improvement seen with Spravato.  She is less hopeless.  Guilt is marked remarkably improved.  She is not having any thoughts of death or dying.  She is more motivated for activities such as exercise which she is recently started.  She is sleeping well. The OCD remains severe but it is improving somewhat with the increase in fluvoxamine.  It is still consuming a couple hours per day.  01/10/22 apravato 84 admin  01/24/22 appt noted: Patient received Spravato 84 mg today.  She tolerated it well without any unusual headache, nausea or vomiting or other somatic symptoms.  Dissociation did occur and she gradually saw resolution over the 2-hour period of observation.  She does not typically find the dissociation very strong. No  SE complaints with meds. Very tearful today.  Feels like she has been suppressing emotion in the Spravato caused it to be released.  Discussed some stressors.  Overall still feels the medicine is helpful.  She has missed some of the scheduled Spravato treatments that were intended to be weekly due to circumstances beyond her control.  She is still struggling with OCD as previously noted but does believe the medications are helpful. Plan no med changes  01/31/2022 received Spravato 84 mg today  02/09/2022 appointment with the following noted: Patient received Spravato 84 mg today.  She tolerated it well without any unusual headache, nausea or vomiting or other somatic symptoms.  Dissociation did occur and she gradually saw resolution over the 2-hour period of observation.  She does not typically find the dissociation very strong. No SE complaints with meds. Spravato clearly helps depression and OCD but easily gets overwhelmed and tearful with fairly routine stressors.  Tolerating meds. Sleep and appetite is OK Asks to increase lorazepam to 2 mg AM and HS and 67m afternoon  02/16/22 appt noted: Patient received Spravato 84 mg today.  She tolerated it well without any unusual headache, nausea or vomiting or other somatic symptoms.  Dissociation did  occur and she gradually saw resolution over the 2-hour period of observation.  She does not typically find the dissociation very strong. No SE complaints with meds. She has chronic depesssion and OCD but is improved with Spravato, both dx versus before.  She has continued Luvox 400 mg and Wellbutrin 450 mg and is tolerating it.  Chronically easily stressed.  Tolerating all meds.  Doesn't like taking more meds.  Spending a couple hours daily with OCD.  No SI No med changes.  02/21/22 appt noted:   Doesn't like taking more meds.  Spending a couple hours daily with OCD.  No SI No med changes.  02/21/22 appt noted: Patient received Spravato 84 mg today.  She  tolerated it well without any unusual headache, nausea or vomiting or other somatic symptoms.  Dissociation did occur and she gradually saw resolution over the 2-hour period of observation.  She does not typically find the dissociation very strong. No SE complaints with meds. She has chronic depesssion and OCD but is improved with Spravato, both dx versus before.  She has continued Luvox 400 mg and Wellbutrin 450 mg and is tolerating it.  Chronically easily overwhelmed and doesn't know why.  Tolerating all meds. Wants to continue meds.  03/16/22 appt noted: Patient received Spravato 84 mg today.  She tolerated it well without any unusual headache, nausea or vomiting or other somatic symptoms.  Dissociation did occur and she gradually saw resolution over the 2-hour period of observation.  She does not typically find the dissociation very strong. No SE complaints with meds. Overall she still feels the Spravato has been helpful not only for her depression but also for her OCD which was somewhat unexpected.  OCD is still significant but it is less severe than prior to starting Spravato.  She is tolerating Luvox 400 mg and Wellbutrin 450 mg.  We discussed possible med adjustments.  03/23/22 appt noted: Patient received Spravato 84 mg today.  She tolerated it well without any unusual headache, nausea or vomiting or other somatic symptoms.  Dissociation did occur and she gradually saw resolution over the 2-hour period of observation.  She does not typically find the dissociation very strong. No SE complaints with meds. She is still depressed and still has OCD of course but is improved with the Spravato.  She is tolerating the medications well.  We had previously discussed the possibility of switching some of the Wellbutrin to [name] and she is very interested in that in hopes of further improvement in depression and OCD.  She understands that Auvelity is not used for OCD on the label.  She is tolerating the  medications.  She is still easily overwhelmed.  She is sleeping and eating okay.. Plan: Reduce Wellbutrin XL to 300 mg AM and add Auvelity 1 tablet each AM  03/30/22 appt noted: Patient received Spravato 84 mg today.  She tolerated it well without any unusual headache, nausea or vomiting or other somatic symptoms.  Dissociation did occur and she gradually saw resolution over the 2-hour period of observation.  She does not typically find the dissociation very strong. No SE complaints with meds. She is still depressed and still has OCD of course but is improved with the Spravato.  She is tolerating the medications well.  No difference with Auvelity 1 AM so far and no SE.  Going on vacation on Saturday. 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 Sleep and appetite good. Plan: Increase Auvelity to 1 twice daily and reduce Wellbutrin  to XL 150 every morning  04/14/2022 appointment with the following noted: Patient received Spravato 84 mg today.  She tolerated it well without any unusual headache, nausea or vomiting or other somatic symptoms.  Dissociation did occur and she gradually saw resolution over the 2-hour period of observation.  She does not typically find the dissociation very strong. No SE complaints with meds. She is still depressed and still has OCD of course but is improved with the Spravato.  She is tolerating the medications well.  Just increased Auvelity to BID yesterday and reduced Wellbutrin to 150 AM. No SE so far.  No change in mood or anxiety so far.  Chronic OCD as noted and residual depression and chronic fatigue.  04/21/2022 appointment with the following noted: Patient received Spravato 84 mg today.  She tolerated it well without any unusual headache, nausea or vomiting or other somatic symptoms.  Dissociation did occur and she gradually saw resolution over the 2-hour period of observation.  She does not typically find the dissociation very strong. No SE complaints with  meds. She is still depressed and still has OCD of course but is improved with the Spravato.   She has questions about the dosing of lorazepam. She tends to have negative anxious thoughts at night.  This tends to interfere with her ability to go to sleep.  She is getting about 8 to 9 hours of sleep.  She is tolerating the meds without excessive sedation and does not nap during the day.  05/06/22 appt noted: Patient received Spravato 84 mg today.  She tolerated it well without any unusual headache, nausea or vomiting or other somatic symptoms.  Dissociation did occur and she gradually saw resolution over the 2-hour period of observation.  She does not typically find the dissociation very strong. No SE complaints with meds. She is still depressed and still has OCD of course but is improved with the Spravato.   Had some questions about timing of dosing of fluvoxamine and Auvelity. OCD is not quite as time consuming.  Sleep and eating are the same.   No SE meds.  05/25/22 appt noted: Patient received Spravato 84 mg today.  She tolerated it well without any unusual headache, nausea or vomiting or other somatic symptoms.  Dissociation did occur and she gradually saw resolution over the 2-hour period of observation.  She does not typically find the dissociation very strong. No SE complaints with meds. She is still depressed and still has OCD of course but is improved with the Spravato.   She has less OCD when away from home and on vacation of note. Plan: Rec gradually reduce HS lorazepam to 1 mg Hs.  Can continue lorazepam 2 mg AM and 1 mg in afternoon bc of chronic anxiety and it is helpful and tolerated. She can continue temazepam 30 mg nightly.  She tends to have a lot of anxious negative thoughts at night when she is trying to go to bed  06/16/22 appt noted: Patient received Spravato 84 mg today.  She tolerated it well without any unusual headache, nausea or vomiting or other somatic symptoms.   Dissociation did occur and she gradually saw resolution over the 2-hour period of observation.  She does not typically find the dissociation very strong. No SE complaints with meds. She is still depressed and still has OCD of course but is improved with the Spravato.   She has less OCD when away from home and on vacation of note. She is tolerating  the medications.  She has continued current medications. 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  She still receives benefit from [place].  Her depression and OCD are better with the Spravato.  She thinks she is getting additional benefit with the switch from Wellbutrin to [place].  07/04/2022 appointment noted: Reports she developed a rash on her face from [facility] and feels like she is allergic to it.  She stopped it and went back to Wellbutrin 450 mg every morning.  The rash has cleared up.  She did not require any medical attention and did not have shortness of breath. Overall her depression and OCD are about the same as they have been.  She did not notice a substantial difference from the brief treatment with Auvelity but she understands she did not take a full course.  She is tolerating the current medicines well. Current meds fluvoxamine 400 mg daily, Wellbutrin XL 450 mg daily, lorazepam 1 to 2 mg in the morning and 1 to 2 mg at night and 1 mg in the afternoon, temazepam 30 mg nightly. She wants to continue the Spravato because she feels it has been helpful for both her depression and her racing OCD  07/18/22 appt noted: Patient received Spravato 84 mg today.  She tolerated it well without any unusual headache, nausea or vomiting or other somatic symptoms.   Dissociation did occur and she gradually saw resolution over the 2-hour period of observation.  She does not typically find the dissociation very strong. No SE complaints with meds. She is still depressed and still has OCD of course but is improved with the Spravato.  Rash better off Auvelity and back on Welllbutrin XL 450 mg AM, fluvoxamine 400 mg daily.  08/15/22 appt noted: Current psych meds: Wellbutrin XL 450 mg AM, fluvoxamine 100 mg in AM and 300 mg HS, lorazepam 1 mg 1-2 mg in the AM and HS and 1 tablet prn midday for anxiety, temazepam 30 mg HS Patient received Spravato 84 mg today.  She tolerated it well without any unusual headache, nausea or vomiting or other somatic symptoms.  Dissociation did occur and she gradually saw resolution over the 2-hour period of observation.  She does not typically find the dissociation very strong. No SE complaints with meds. She has a great deal of stress dealing with her family.  Disc brother's ongoing mania and difficulty getting him help and the stress he causes for the family. She wants to continue Spravato through this very stressful holdicay season and reevaluate the frequency after the New Year.  09/12/22 appt noted: Current psych meds: Wellbutrin XL 450 mg AM, fluvoxamine 100 mg in AM and 300 mg HS, lorazepam 1 mg 1-2 mg in the AM and HS and 1 tablet prn midday for anxiety, temazepam 30 mg HS Patient received Spravato 84 mg today.  She tolerated it well without any unusual headache, nausea or vomiting or other somatic symptoms.  Dissociation did occur and she gradually saw resolution over the 2-hour period of observation.  She does not typically find the dissociation very strong. No SE complaints with meds. She has a great deal of stress dealing with her family.  Disc brother's  ongoing mania and difficulty getting him help and the stress he causes for the family. She wants to continue Spravato through this very stressful holdicay season and  reevaluate the frequency after the New Year.  Chronically easily overwhelmed with family. Complaining of HA and history migraine.  Asks for increase imitrex and disc preventatives like propranolol ER  Previous psych med trials include Prozac, paroxetine, sertraline, fluvoxamine, venlafaxine, Anafranil with no response,  Wellbutrin, , Viibryd, Trintellix 10 1 month NR Auvelity BID NR Geodon,  risperidone, Rexulti, Abilify,  Seroquel, Latuda 40 mg with irritability.  lamotrigine lithium,  BuSpar, Namenda,  pramipexole with no response, and Topamax, pindolol  ECT-MADRS    [place] Office Visit from 06/29/2021 in [facility] Total Score 36      Flowsheet Row Admission (Discharged) from 06/11/2021 in [facility] No Risk        Review of Systems:  Review of Systems  Constitutional:  Positive for fatigue.  Cardiovascular:  Negative for palpitations.  Musculoskeletal:  Positive for arthralgias, back pain, gait problem and neck pain. Negative for joint swelling.  Neurological:  Positive for weakness. Negative for tremors.  Psychiatric/Behavioral:  Positive for dysphoric mood. Negative for suicidal ideas. The patient is nervous/anxious.     Medications: I have reviewed the patient's current medications.  Current Outpatient Medications  Medication Sig Dispense Refill   Abaloparatide (TYMLOS) 3120 MCG/1.56ML SOPN Inject into the skin.     Azelastine-Fluticasone 137-50 MCG/ACT SUSP Place 1-2 sprays into both nostrils daily.     baclofen (LIORESAL) 10 MG tablet Take 20 mg by mouth at bedtime as needed for muscle spasms.     buPROPion (WELLBUTRIN XL) 150 MG 24 hr tablet TAKE 3 TABLETS BY MOUTH DAILY (Patient taking differently: Take 450 mg by mouth daily.) 270 tablet 0   dicyclomine (BENTYL) 10 MG capsule Take 10 mg by mouth daily.     docusate sodium (COLACE) 100 MG capsule Take 1 capsule (100 mg total) by mouth 2 (two)  times daily. (Patient taking differently: Take 100 mg by mouth daily.) 10 capsule 0   Esketamine HCl, 84 MG Dose, (SPRAVATO, 84 MG DOSE,) 28 MG/DEVICE SOPK USE 3 SPRAYS IN EACH NOSTRIL EVERY OTHER WEEK 3 each 3   fexofenadine (ALLEGRA) 180 MG tablet Take 180 mg by mouth daily.     fluvoxaMINE (LUVOX) 100 MG tablet 1 tablet in the AM and 3 tablets in the evening 360 tablet 0   hydrocortisone (ANUSOL-HC) 2.5 % rectal cream Place rectally 2 (two) times daily. x 7-14 days 30 g 0   ketotifen (ZADITOR) 0.025 % ophthalmic solution Place 3 drops into both eyes at bedtime.     LORazepam (ATIVAN) 1 MG tablet TAKE 1-2 IN THE AM AND 1-2 TABLETS EVERY NIGHT AT BEDTIME AND 1 TABLET IN AFTERNOON when needed for anxiety and sleep 150 tablet 1   magnesium gluconate (MAGONATE) 500 MG tablet Take 500 mg by mouth daily.     MIBELAS 24 FE 1-20 MG-MCG(24) CHEW Chew 1 tablet by mouth at bedtime as needed (bowel regularity).     Multiple Vitamins-Minerals (ADULT GUMMY PO) Take 2 tablets by mouth in the morning.     nitrofurantoin (MACRODANTIN) 100 MG capsule Take 100 mg by mouth as needed (For urinary tract infection.).      oxyCODONE-acetaminophen (PERCOCET/ROXICET) 5-325 MG tablet Take 1-2 tablets by mouth every 6 (six) hours as needed for severe pain. 50 tablet 0  polyethylene glycol (MIRALAX / GLYCOLAX) packet Take 17 g by mouth daily as needed for mild constipation. 14 each 0   propranolol ER (INDERAL LA) 60 MG 24 hr capsule Take 1 capsule (60 mg total) by mouth daily. 30 capsule 0   psyllium (METAMUCIL) 58.6 % powder Take 1 packet by mouth daily as needed (constipation).     SUMAtriptan (IMITREX) 100 MG tablet Take 1 tablet (100 mg total) by mouth every 2 (two) hours as needed for migraine. May repeat in 2 hours if headache persists or recurs. 10 tablet 1   temazepam (RESTORIL) 30 MG capsule TAKE 1 CAPSULE BY MOUTH AT BEDTIME AS NEEDED FOR SLEEP. 30 capsule 2   Vitamin D-Vitamin K (VITAMIN K2-VITAMIN D3 PO) Take 1-2  sprays by mouth daily.     No current facility-administered medications for this visit.    Medication Side Effects: None   Allergies:  Allergies  Allergen Reactions   Hydrocodone Itching   Sulfamethoxazole-Trimethoprim Itching   Dust Mite Extract Other (See Comments)    Sneezing, watery eyes, runny nose   Latex Itching   Other Other (See Comments)    PT IS ALLERGIC TO CAT DANDER AND RAGWEED - Sneezing, watery eyes, runny nose    Pollen Extract Other (See Comments)    Sneezing, watery eyes, runny nose     Past Medical History:  Diagnosis Date   Abnormal Pap smear 2011   hpv/mild dysplasia,cin1   Anxiety    Cerebral palsy (HCC)    right arm/leg   Cystocele    Depression    Headache    Neuromuscular disorder (HCC)    Cerebral Palsy   OCD (obsessive compulsive disorder)    Osteoporosis    Uterine prolaps     Family History  Problem Relation Age of Onset   Cancer Father        skin AND LUNG   Alcohol abuse Sister        CRACK COCAINE    Social History   Socioeconomic History   Marital status: Married    Spouse name: Not on file   Number of children: Not on file   Years of education: Not on file   Highest education level: Not on file  Occupational History   Not on file  Tobacco Use   Smoking status: Never   Smokeless tobacco: Never  Substance and Sexual Activity   Alcohol use: Not Currently    Comment: OCCASIONAL beer   Drug use: No   Sexual activity: Yes    Birth control/protection: Pill    Comment: LOESTRIN 24 FE  Other Topics Concern   Not on file  Social History Narrative   Not on file   Social Determinants of Health   Financial Resource Strain: Not on file  Food Insecurity: Not on file  Transportation Needs: Not on file  Physical Activity: Not on file  Stress: Not on file  Social Connections: Not on file  Intimate Partner Violence: Not on file    Past Medical History, Surgical history, Social history, and Family history were reviewed  and updated as appropriate.   Please see review of systems for further details on the patient's review from today.   Objective:   Physical Exam:  LMP  (LMP Unknown)   Physical Exam Constitutional:      General: She is not in acute distress. Neurological:     Mental Status: She is alert and oriented to person, place, and time.     Cranial  Nerves: No dysarthria.     Motor: Weakness present.     Gait: Gait abnormal.  Psychiatric:        Attention and Perception: Attention and perception normal.        Mood and Affect: Mood is anxious and depressed. Affect is not labile.        Speech: Speech normal.        Behavior: Behavior normal. Behavior is cooperative.        Thought Content: Thought content normal. Thought content is not delusional. Thought content does not include homicidal or suicidal ideation. Thought content does not include suicidal plan.        Cognition and Memory: Cognition and memory normal. Cognition is not impaired.        Judgment: Judgment normal.     Comments: Insight intact Ongoing OCD remains fairly severe but less anxious Checking compulsions up to 2 hours daily but improved noticeably Chronic depression persistent but better with Spravato Still can be easily upset      Lab Review:     Component Value Date/Time   NA 138 06/11/2021 0606   K 4.0 06/11/2021 0606   CL 107 06/11/2021 0606   CO2 26 06/11/2021 0606   GLUCOSE 90 06/11/2021 0606   BUN 18 06/11/2021 0606   CREATININE 0.81 06/11/2021 0606   CALCIUM 9.4 06/11/2021 0606   PROT 6.5 06/11/2021 0606   ALBUMIN 3.3 (L) 06/11/2021 0606   AST 17 06/11/2021 0606   ALT 14 06/11/2021 0606   ALKPHOS 141 (H) 06/11/2021 0606   BILITOT 0.2 (L) 06/11/2021 0606   GFRNONAA >60 06/11/2021 0606   GFRAA >60 07/09/2016 0438       Component Value Date/Time   WBC 5.8 06/11/2021 0606   RBC 4.12 06/11/2021 0606   HGB 12.5 06/11/2021 0606   HCT 39.7 06/11/2021 0606   PLT 299 06/11/2021 0606   MCV 96.4  06/11/2021 0606   MCH 30.3 06/11/2021 0606   MCHC 31.5 06/11/2021 0606   RDW 13.9 06/11/2021 0606   LYMPHSABS 1.9 06/11/2021 0606   MONOABS 0.5 06/11/2021 0606   EOSABS 0.1 06/11/2021 0606   BASOSABS 0.0 06/11/2021 0606    No results found for: "POCLITH", "LITHIUM"   No results found for: "PHENYTOIN", "PHENOBARB", "VALPROATE", "CBMZ"   .res Assessment: Plan:    Breona was seen today for follow-up, depression, anxiety and fatigue.  Diagnoses and all orders for this visit:  Recurrent major depression resistant to treatment [facility])  Mixed obsessional thoughts and acts  Social anxiety disorder  Insomnia due to mental condition  Migraine without aura and without status migrainosus, not intractable -     SUMAtriptan (IMITREX) 100 MG tablet; Take 1 tablet (100 mg total) by mouth every 2 (two) hours as needed for migraine. May repeat in 2 hours if headache persists or recurs. -     propranolol ER (INDERAL LA) 60 MG 24 hr capsule; Take 1 capsule (60 mg total) by mouth daily.    Both Dx are TR and marked.  Impaired function but less so with Spravato re: depression..   She is receiving Spravato 84 mg weekly and marked improvement in the depression..  she feels it also helps OCD somewhat.  However still easily overwhelmed with low stress tolerance.  The OCD is improved with the increase in fluvoxamine and with Spravato.  Spends 2 hours daily and checking compulsions on her worst days but better when she travels.  She has been on higher doses  of fluvoxamine above the usual max of 400 mg daily in the past.  This became difficult to obtain at 1 point and the dose was reduced to 300 mg daily.   Disc SE. She would like to continue Luvox 400 mg daily again    She is tolerating the meds well  Continue Increased Luvox back to 400 mg nightly as of January 2023. Disc dosing higher than usual.  She feels this is increase has helped more with OCD which remains chronically severe.  Rash better off  Auvelity Per her request continue Wellbutrin XL 450 mg every morning.  Disc Spravato DT TRD incl details and SE. Disc dosing and duration.  Pt with severe depression MADRS 36 on 06/29/21  Patient was administered Spravato 84 mg intranasally dosage today.  The patient experienced the typical dissociation which gradually resolved over the 2-hour period of observation.  There were no complications.  Specifically the patient did not have nausea or vomiting or headache.  Blood pressures remained within normal ranges at the 40-minute and 2-hour follow-up intervals.  By the time the 2-hour observation period was met the patient was alert and oriented and able to exit without assistance. She tends to have lingering sedative effects but not severe. .  See nursing note for further details. Per protocol will continue Spravato to 84 mg weekly.  She has been okay since cutting back to once weekly.  We discussed the short-term risks associated with benzodiazepines including sedation and increased fall risk among others.  Discussed long-term side effect risk including dependence, potential withdrawal symptoms, and the potential eventual dose-related risk of dementia.  But recent studies from 2020 dispute this association between benzodiazepines and dementia risk. Newer studies in 2020 do not support an association with dementia. Disc this is high dose and not ideal.  Also disc risk combining it with temazepam. Rec gradually reduce HS lorazepam to 1 mg Hs.  Can continue lorazepam 2 mg AM and 1 mg in afternoon bc of chronic anxiety and it is helpful and tolerated. She can continue temazepam 30 mg nightly.  She tends to have a lot of anxious negative thoughts at night when she is trying to go to bed.  She is trying to reduce the dose.  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  Complaining of HA and history migraine.  Asks for increase imitrex and disc preventatives like propranolol ER Ok  increase imitrex to 100 mg prn migraine and start propranolol ER for migraine prevention.  Supportive therapy dealing with some of the recent stressors including family stressors and her own health related to cerebral palsy   Follow-up every other week   Lynder Parents, MD, DFAPA  Please see After Visit Summary for patient specific instructions.  Future Appointments  Date Time Provider [place]  09/21/2022 11:00 AM Blanchie Serve, PhD [facility] None  10/26/2022 11:00 AM Blanchie Serve, PhD [facility] None  11/08/2022 11:00 AM Blanchie Serve, PhD CP-[facility] None  11/21/2022 11:00 AM Blanchie Serve, PhD CP[facility] None  12/20/2022 11:00 AM Blanchie Serve, PhD CP[facility] None    No orders of the defined types were placed in this encounter.    -------------------------------

## 2022-09-12 NOTE — Telephone Encounter (Signed)
Noted will discuss with [name].

## 2022-09-12 NOTE — Telephone Encounter (Signed)
I am sorry.  I don't recall discussing med changes.  Does she remember anything about it.  Didn't get my note finished last week.  Finished it today

## 2022-09-12 NOTE — Telephone Encounter (Signed)
[name] called this morning at 10:40 indicating that when she was last here some medication changes were made.  You added a new prescription but she the pharmacy does not have a new prescription.  Please send in the prescription for the new changes.  Call her with any questions.

## 2022-09-13 ENCOUNTER — Ambulatory Visit (INDEPENDENT_AMBULATORY_CARE_PROVIDER_SITE_OTHER): Payer: 59 | Admitting: Psychiatry

## 2022-09-13 ENCOUNTER — Telehealth: Payer: Self-pay | Admitting: Psychiatry

## 2022-09-13 DIAGNOSIS — F4010 Social phobia, unspecified: Secondary | ICD-10-CM | POA: Diagnosis not present

## 2022-09-13 DIAGNOSIS — F422 Mixed obsessional thoughts and acts: Secondary | ICD-10-CM

## 2022-09-13 DIAGNOSIS — Z638 Other specified problems related to primary support group: Secondary | ICD-10-CM

## 2022-09-13 DIAGNOSIS — F339 Major depressive disorder, recurrent, unspecified: Secondary | ICD-10-CM

## 2022-09-13 DIAGNOSIS — Z636 Dependent relative needing care at home: Secondary | ICD-10-CM

## 2022-09-13 MED ORDER — PROPRANOLOL HCL ER 60 MG PO CP24: 60.0000 mg | ORAL_CAPSULE | Freq: Every day | ORAL | 0 refills | Status: DC

## 2022-09-13 MED ORDER — SUMATRIPTAN SUCCINATE 100 MG PO TABS: 100.0000 mg | ORAL_TABLET | ORAL | 1 refills | Status: DC | PRN

## 2022-09-13 NOTE — Telephone Encounter (Signed)
Pt was in today for therapy. She said at her last spravato treatment [name] and traci discussed with the pt increasing her sumatriptan and sending a script of propanolol in to the pharmacy

## 2022-09-13 NOTE — Telephone Encounter (Signed)
Sent RX for imitex 100 and propranolol ER once daily

## 2022-09-13 NOTE — Progress Notes (Unsigned)
Psychotherapy Progress Note [facility] Luan Moore, PhD LP  Patient ID: Gayla Medicus Combes (Faven "Eustaquio Maize")    MRN: [id number] Therapy format: Individual psychotherapy Date: 09/13/2022      Start: 11:16a     Stop: 12:06p     Time Spent: 50 min Location: In-person   Session narrative (presenting needs, interim history, self-report of stressors and symptoms, applications of prior therapy, status changes, and interventions made in session) Connected with Rosalia Hammers, felt he would be a good choice for [name], but he's in [facility] now and doesn't take insurance assignment, plus [name] is balking about OOP costs with their HDHP.  Encouraged get clear together with [name] about value, namely that a HDHP means strong out of pocket costs unless indiv or family deductible has been met already.    Notes that she does still have intermittent SI, no plan/intent but driven to tears.  Typically part of catastrophizing and ruminating about her many hardships.  Recently set off by family TG at her house, reluctantly hosting.  Found herself reserved with her generation, brighter with niece and nephew, just hard to enjoy for feeling ill at ease and on guard for being stuck with family responsibilities.  Confirmed no self-harm plans.  Encouraged further in advocating with family to collaborate kindly on difficult care of mother and manic brother, and to recruit H to understand the balance of financial and mental health considerations for son, as well as other matters in their relationship, taking care to focus on one issue at a time, no matter how much neurotic pressure to overstuff the conversation.  Therapeutic modalities: Cognitive Behavioral Therapy, Solution-Oriented/Positive Psychology, Ego-Supportive, and Assertiveness/Communication  Mental Status/Observations:  Appearance:   Casual     Behavior:  Appropriate  Motor:  Normal and exc CP-related  Speech/Language:   Clear and Coherent   Affect:  Appropriate  Mood:  anxious and dysthymic  Thought process:  normal  Thought content:    WNL  Sensory/Perceptual disturbances:    WNL  Orientation:  Fully oriented  Attention:  Good    Concentration:  Fair  Memory:  WNL  Insight:    Good  Judgment:   Good  Impulse Control:  Good   Risk Assessment: Danger to Self: No Self-injurious Behavior: No Danger to Others: No Physical Aggression / Violence: No Duty to Warn: No Access to Firearms a concern: No  Assessment of progress:  stabilized  Diagnosis:   ICD-10-CM   1. Recurrent major depression resistant to treatment ([place])  F33.9     2. Caregiver stress  Z63.6     3. Social anxiety disorder  F40.10     4. Mixed obsessional thoughts and acts  F42.2     5. Relationship problem with family members  Z63.8      Plan:  Family assistance -- Self-affirm that wanting to help is not dysfunctional, all calls are judgment calls.  Option Al-Anon for support and boundary help.  Endorse connecting [place] to further help, either through [facility] or [facility], and recruiting family members into necessary confrontation. Relationship with H -- Open to join.  Consider addressing H's overinterpretation of "choosing" FOO over FOI/him and perceived negativity toward [place]. Consider further willingness to challenge sexual habits on grounds of feeling left out, ask for H to confer re sexual expectations rather than simply accede to demands.  Take care to approach one issue at a time. Self-care -- Continue efforts to engage exercise, part-time work, and supportive relationship outside the  home.  Notice and resist chunking troubles together Parenting -- Continue appropriate efforts to shape [name]'s socializing and responsibility to clean up after himself, e.g., "after you ___, then you can ___".  Re. perceived loss of close relationship, self-affirm that she always wanted to create a "buddy" but any adolescent boy still distances and may go through a sullen,  isolative period.  Other options for therapy for him. Depressive thinking -- Generally, look for thought patterns of shaming self irrationally and collecting troubles to the point of wanting to leave town -- both are distress-making and may interfere with applying interpersonal effectiveness principles. Intrusive thoughts and compulsions -- seems to be lower priority at this stage, but when present, seek to press on without checking corners and spaces for imaginary abused children Other recommendations/advice as may be noted above Continue to utilize previously learned skills ad lib Maintain medication as prescribed and work faithfully with relevant prescriber(s) if any changes are desired or seem indicated Call the clinic on-call service, 988/hotline, 911, or present to [facility] or ER if any life-threatening psychiatric crisis Return for as already scheduled. Already scheduled visit in this office 09/21/2022.  Blanchie Serve, PhD Luan Moore, PhD LP Clinical Psychologist, [facility] [address], [place], [place] [phone]

## 2022-09-13 NOTE — Telephone Encounter (Signed)
We just discussed this.

## 2022-09-13 NOTE — Telephone Encounter (Signed)
Please review

## 2022-09-13 NOTE — Addendum Note (Signed)
Addended by: Kirstie Peri on: 09/13/2022 01:30 PM   Modules accepted: Orders

## 2022-09-21 ENCOUNTER — Ambulatory Visit (INDEPENDENT_AMBULATORY_CARE_PROVIDER_SITE_OTHER): Payer: 59 | Admitting: Psychiatry

## 2022-09-21 DIAGNOSIS — F66 Other sexual disorders: Secondary | ICD-10-CM | POA: Diagnosis not present

## 2022-09-21 DIAGNOSIS — F4010 Social phobia, unspecified: Secondary | ICD-10-CM | POA: Diagnosis not present

## 2022-09-21 DIAGNOSIS — G809 Cerebral palsy, unspecified: Secondary | ICD-10-CM

## 2022-09-21 DIAGNOSIS — F339 Major depressive disorder, recurrent, unspecified: Secondary | ICD-10-CM

## 2022-09-21 DIAGNOSIS — Z638 Other specified problems related to primary support group: Secondary | ICD-10-CM

## 2022-09-21 DIAGNOSIS — Z62810 Personal history of physical and sexual abuse in childhood: Secondary | ICD-10-CM

## 2022-09-21 DIAGNOSIS — F422 Mixed obsessional thoughts and acts: Secondary | ICD-10-CM

## 2022-09-21 DIAGNOSIS — Z636 Dependent relative needing care at home: Secondary | ICD-10-CM
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
## 2022-09-26 ENCOUNTER — Encounter: Payer: Self-pay | Admitting: Psychiatry

## 2022-09-26 ENCOUNTER — Ambulatory Visit: Payer: 59

## 2022-09-26 ENCOUNTER — Other Ambulatory Visit: Payer: Self-pay | Admitting: Psychiatry

## 2022-09-26 ENCOUNTER — Ambulatory Visit (INDEPENDENT_AMBULATORY_CARE_PROVIDER_SITE_OTHER): Payer: 59 | Admitting: Psychiatry

## 2022-09-26 VITALS — BP 119/88 | HR 82

## 2022-09-26 DIAGNOSIS — F4010 Social phobia, unspecified: Secondary | ICD-10-CM

## 2022-09-26 DIAGNOSIS — F339 Major depressive disorder, recurrent, unspecified: Secondary | ICD-10-CM

## 2022-09-26 DIAGNOSIS — G43009 Migraine without aura, not intractable, without status migrainosus: Secondary | ICD-10-CM

## 2022-09-26 DIAGNOSIS — F5105 Insomnia due to other mental disorder: Secondary | ICD-10-CM | POA: Diagnosis not present

## 2022-09-26 DIAGNOSIS — F422 Mixed obsessional thoughts and acts: Secondary | ICD-10-CM

## 2022-09-26 NOTE — Progress Notes (Signed)
Casey Diaz [id number] 1965-11-04 56 y.o.    Subjective:   Patient ID:  Casey Diaz is a 56 y.o. (DOB 11/16/1965) female.  Chief Complaint:  Chief Complaint  Patient presents with   Follow-up   Depression   Anxiety   Fatigue   ADD     HPI Casey Diaz presents to the office today for follow-up of OCD and severe anxiety.     December 2019 visit the following was noted: No meds were changed. Lives in [place] and back for followup.  Sx are about the same.  Has to take meds with different sizes. Pt reports that mood is Anxious and Depressed and describes anxiety as Severe. Anxiety symptoms include: Excessive Worry, Obsessive Compulsive Symptoms:   Checking,,. Pt reports has interrupted sleep and nocturia. Pt reports that appetite is good. Pt reports that energy is no change and down slightly. Concentration is down slightly. Suicidal thoughts:  denied by patient. Loves the environment of [place] but misses some things there.  She's not able to work there.  H works there and likes it.  Struggled with not working, feels isolated and not up to task of meeting people.  Does attend a church and met a friend who's been helpful.  Leaving for [place] on 10/16/18.   04/09/2020 appointment the following is noted:  Staying another year in [place] bc Covid and other things. Last few months a lot of crying spells.  Is in menopause. Wonders about med changes though is nervous about it.  Crying spells associated with depressing thoughts more than stress or OCD.   Covid really hard on everyone and couldn't see family for 18 mos.  Family still very dysfunctional. No close friends in part due to OCD and depression. Son high Autism spectrum with ADHD and anxiety and she's with him all the time. Greater health problems with CP so more pains.   05/15/20 appt with the following noted: Casey Diaz for menopause and helps some. Still depressed.  Chronically. In [place] for 2 more weeks then to [place]  for another year. A lot of stressors lately triggering more checking and anxiety.   OCD is her CC now and seems.  Got worse DT stress.   Stressed with Asberger's son and her health.  H works a lot.  Her FOO still stress. Plan: Trintellix 10 mg 1 tablet in the morning with food and reduce fluvoxamine to 5 tablets nightly for 1 week  then reduce it to 4 tablets nightly.   07/02/20 appt with the following noted: Decided not to get Trintellix bc difficulty getting it. It is available.  There.  Wants to start it now.   Both depression and OCD are severe.  Not suicidal in intent or plan. Did not take samples with her to [place] but will be back in December. covid is worse there and travel is difficult.  Wants to reduce Wellbutrin DT dry mouth. Plan: She's afraid to reduce Luvox at this time DT fear of worsening OCD.  But will consider. Trintellix 10 mg 1 tablet in the morning with food and reduce fluvoxamine to 5 tablets nightly for 1 week  then reduce it to 4 tablets nightly. Also reduce Wellbutrin XL to 300 mg daily.    9-13 2022 appointment with the following noted: Back in [place] since July 14.  Broke arm a month ago and surgery.  It's all been rough adjustment.   B has cancer on his face and M fell taking him to  the doctor.  Misses the water and weather of [place].   Cry a lot more since menopause. Still depression and anxiety and OCD.  Asks about ketamine. On Wellbutrin 300, Luvox 300.  No Trintellix. Added Ativan 2 mg AM and HS and it helps.  More likely to get upset at night. Plan: Increase Luvox back to 400 mg daily.  She thinks she's worse on less. Continue Wellbutrin XL to 300 mg daily. 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  She tolerated it well without unusual nausea or vomiting headache or other somatic symptoms.  She did have the expected dissociation which gradually resolved  over the course of the 2-hour period of observation.  She was a little concerned about her balance given her cerebral palsy but has not noted unusual or unexpected problems.  She is motivated to can continue Spravato in hopes of reducing her depressive symptoms. 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  She also has treatment resistant OCD which is partially managed with medications but is still quite disabling.  She is tolerating the medications well.  She is sleeping adequately.  Her appetite is adequate.  She is not having suicidal thoughts.  She continues to wish for a better treatment for OCD that would give her some relief.  09/30/2021 appointment with the following noted: She received her first dose of Spravato 84 mg intranasally today.  She tolerated it well without unusual nausea, vomiting, or other somatic symptoms.  Dissociation as expected did occur and gradually resolved over the 2-hour period of observation.  She did have a mild headache today with the treatment and received ibuprofen 600 mg at her request.  We will follow this to see if it is a pattern Patient is still depressed.  She said she was late with her medicine today and today was a particularly depressing day.  However she notes that the Spravato has lifted her mood considerably even today.  She is hopeful that it will continue to be helpful.  No suicidal thoughts.  She has ongoing chronic anxiety and OCD at baseline.  10/04/21 appt noted: Patient received Spravato 84 mg for the second time today.  She tolerated it well without any unusual headache, nausea or vomiting or other somatic symptoms.  Dissociation did occur and she gradually [place] resolution over the 2-hour period of observation. She did not have any unusual problems after she left the office last Spravato administration.  She did not have any specific problems with balance or walking.  She is at increased risk of that difficulty  because of cerebral palsy.  So far she has not noticed much mood effect from the medication beyond the first day of receiving it.  However she would like to continue Spravato in hopes of getting the antidepressant effect that is desired. Stress dealing with mother's behavior at party pt hosted.  Guilt over it.  10/07/2021 appointment noted: Patient received Spravato 84 mg for the second time today.  She tolerated it well without any unusual headache, nausea or vomiting or other somatic symptoms.  Dissociation did occur and she gradually [place] resolution over the 2-hour period of observation. She still is not sure about the antidepressant effect of Spravato.  Events over the holidays and demands, make it difficult to assess.  She still notes that the OCD tends to worsen the depression and vice versa.  She tolerates the Spravato well and wants to continue the trial.  10/15/2021 appointment with the following noted: Patient received Spravato 84 mg for the second time today.  She tolerated it well without any unusual headache, nausea or vomiting or other somatic symptoms.  Dissociation did occur and she gradually [place] resolution over the 2-hour period of observation. Patient says it was somewhat difficult to evaluate the effect of the Spravato.  It was scheduled to be twice weekly for 4 weeks consecutively but the holidays have interfered with that administration.  She asked what specifically should be she should be looking for in order to assess improvement.  That was discussed.  The OCD is unchanged and the depression so far is not significantly different.  She still tolerates meds.  There have been no recent med changes  10/19/2021 appt noted: Patient received Spravato 84 mg for the second today.  She tolerated it well without any unusual headache, nausea or vomiting or other somatic symptoms.  Dissociation did occur and she gradually saw resolution over the 2-hour period of observation.   10/21/2021  appointment noted: Patient received Spravato 84 mg today.  She tolerated it well without any unusual headache, nausea or vomiting or other somatic symptoms.  Dissociation did occur and she gradually saw resolution over the 2-hour period of observation.  She feels better than last week.  She is not as depressed and down.  She is still dealing with grief around the death of her cousin that was unexpected.  It is still difficult to tell how much the Spravato was doing but she is hopeful.  Anxiety is still present with the OCD.  She is not having suicidal thoughts.  She is not hopeless.  She wants to continue treatment.  10/25/2021 appointment with the following noted: Patient received Spravato 84 mg today.  She tolerated it well without any unusual headache, nausea or vomiting or other somatic symptoms.  Dissociation did occur and she gradually saw resolution over the 2-hour period of observation.  She does not typically find the dissociation very strong. She is beginning to think the Spravato is helping somewhat with the depression.  It has been difficult to tell with the holidays intervening as well as the death of her cousin.  She has not been able to get Spravato twice weekly for 4 weeks straight as typically planned.  However she is hopeful.  The OCD remains significant.  She still has a tendency to think very negatively.  She is not suicidal.  10/28/2021 appointment with the following noted: Patient received Spravato 84 mg today.  She tolerated it well without any unusual headache, nausea or vomiting or other somatic symptoms.  Dissociation did occur and she gradually saw resolution over the 2-hour period of observation.  She does not typically find the dissociation very strong. She is feeling more hopeful about the administration of Spravato.  She is having less depression she believes.  Still not dramatically different.  She still has a tendency to have a lot of anxiety and rumination and OCD.  She is  not suicidal.  She is eager to continue the Spravato.  11/01/2021 appointment with the following noted: Patient received Spravato 84 mg today.  She tolerated it well without any unusual headache, nausea or vomiting or other somatic symptoms.  Dissociation did occur and she gradually saw resolution over the 2-hour period of observation.  She does not typically find the dissociation very strong. She is continuing to see a little bit of  improvement in depression with Spravato.  The anxiety remains but may be not as severe.  The OCD remains markedly severe chronically.  She is not suicidal.  She is encouraged by the degree of improvement with Spravato and inability to enjoy things more and not be quite as ruminative.  11/04/2021 appt noted: Patient received Spravato 84 mg today.  She tolerated it well without any unusual headache, nausea or vomiting or other somatic symptoms.  Dissociation did occur and she gradually saw resolution over the 2-hour period of observation.  She does not typically find the dissociation very strong. No SE complaints with meds. She continues to feel hopeful about the Spravato.  She has less depression.  Because of a number of factors she is uncertain of the full benefit but thinks she is somewhat less depressed.  Her anxiety and OCD remain significant but a little better.  She is tolerating the medications and does not desire medicine change.  She is not currently complaining of insomnia.   11/08/2021 appointment the following noted: Patient received Spravato 84 mg today.  She tolerated it well without any unusual headache, nausea or vomiting or other somatic symptoms.  Dissociation did occur and she gradually saw resolution over the 2-hour period of observation.  She does not typically find the dissociation very strong. No SE complaints with meds. She feels the Spravato is helping somewhat.  She would like to see a greater effect.  However she is able to enjoy things.  She is  productive at home.  She would like to see a lifting of a degree of sadness that remains.  The anxiety and OCD remained largely unchanged.  She wondered about the dosing of Wellbutrin 300 mg a day and Luvox 300 mg a day and possible increases.  She has been at higher doses in the past.  She plans to start water therapy for her weakness and for her shoulder.  11/11/2021 appointment with the following noted: Patient received Spravato 84 mg today.  She tolerated it well without any unusual headache, nausea or vomiting or other somatic symptoms.  Dissociation did occur and she gradually saw resolution over the 2-hour period of observation.  She does not typically find the dissociation very strong. No SE complaints with meds. She feels the Spravato is clearly helping the depression.  She would like to see a more significant effect.  She is still having trouble thinking positive. Her energy is fair.  Concentration is good except for the problem with chronic obsessions. She has been taking Wellbutrin 300 mg in Luvox 300 mg for quite some time but has taken higher doses in the past.  We discussed that.  She would like to try higher doses in order to get a better effect if possible. We just increased the doses a couple of days ago.  No effect yet.  11/15/2021 appointment with the following noted: Patient received Spravato 84 mg today.  She tolerated it well without any unusual headache, nausea or vomiting or other somatic symptoms.  Dissociation did occur and she gradually saw resolution over the 2-hour period of observation.  She does not typically find the dissociation very strong. No SE complaints with meds. The patient is now convinced that the Spravato is helping the depression.  She would like to continue twice weekly Spravato this week if possible.  She has tolerated the increase in Wellbutrin to 450 mg daily and the increase and fluvoxamine to 400 mg daily without complications thus far.  The OCD  and  anxiety feed the depression to some extent. She spends approximately 2 hours daily with checking compulsions due to obsessions about causing harm to others.  For example fearing that when she has hit a pot hole that she may have hit a person and going back to check.  Checking corners and rooms out of fear that she may have harmed someone.  Other various checking compulsions.  She is hoping the increase in fluvoxamine to 400 mg will reduce that over the weeks to come.  She is not seeing a significant difference with the addition of the Spravato though she understands that was not expected.  She is more productive at home and more motivated and able to enjoy things more fully as a result of the Spravato treatment.  She is tolerating the medication  11/18/2021 appointment with the following noted: Patient received Spravato 84 mg today.  She tolerated it well without any unusual headache, nausea or vomiting or other somatic symptoms.  Dissociation did occur and she gradually saw resolution over the 2-hour period of observation.  She does not typically find the dissociation very strong. No SE complaints with meds. She clearly believes the Spravato has been helpful for the depression.  She wonders whether to continue to treatments weekly or to cut back to 1 weekly.  She would like to continue twice weekly in hopes of getting additional improvement in the depression because it is not resolved but it is difficult to get here twice a week in terms of arranging rides. She is recently increased Wellbutrin XL to 450 mg daily and fluvoxamine to 400 mg daily but they have not had time to have an official effect.  She is tolerating that well.  She is tolerating meds overwork overall well. The OCD remains the same as noted on 11/15/2021  11/25/21 appt noted: Patient received Spravato 84 mg today.  She tolerated it well without any unusual headache, nausea or vomiting or other somatic symptoms.  Dissociation did occur and she  gradually saw resolution over the 2-hour period of observation.  She does not typically find the dissociation very strong. No SE complaints with meds. She thinks the increase in Wellbutrin and Luvox have been potentially helpful for depression and OCD respectively.  It has been too early to see the full effect.  She is sleeping and eating well.  She is functioning at home.  She still spends a lot of time that is about 2 hours a day dealing with compulsive behaviors.  12/02/21 appt noted: Patient received Spravato 84 mg today.  She tolerated it well without any unusual headache, nausea or vomiting or other somatic symptoms.  Dissociation did occur and she gradually saw resolution over the 2-hour period of observation.  She does not typically find the dissociation very strong. No SE complaints with meds. Several losses and stressors recently that affect her sense of mood. However still sees significant benefit from the Spravato for her depression.  Wants to continue it. Suspect early  some benefit from the increased Wellbutrin for depression and Luvox for OCD. Tolerating meds. No complaints about the meds. Sleeping and eating well.  No new health concerns.  12/09/21 appt noted: Patient received Spravato 84 mg today.  She tolerated it well without any unusual headache, nausea or vomiting or other somatic symptoms.  Dissociation did occur and she gradually saw resolution over the 2-hour period of observation.  She does not typically find the dissociation very strong. No SE complaints with meds. Seeing  noticeable improvement from increase fluvoxamine to 400 mg daily.  Tolerating meds without concerns over them. Depression is stable with residual sx of easy guilt and easily stressed.  OCD contributes to depression but depression is not severe with less crying spells.  Productive at home with chores.  Enjoyed recent birthday.  Sleeping good. No new concerns.  12/23/2021 appointment noted: Patient received  Spravato 84 mg today.  She tolerated it well without any unusual headache, nausea or vomiting or other somatic symptoms.  Dissociation did occur and she gradually saw resolution over the 2-hour period of observation.  She does not typically find the dissociation very strong. No SE complaints with meds. Seeing noticeable improvement from increase fluvoxamine to 400 mg daily.  Tolerating meds without concerns over them. Her depression is somewhat improved with the Spravato.  She also feels generally a little lighter.  She is more motivated.  She is less overwhelmed by guilt.  The OCD is gradually improving but is still quite time-consuming as noted before.  She is sleeping well.  No side effects  12/30/2021 appointment with the following noted: Patient received Spravato 84 mg today.  She tolerated it well without any unusual headache, nausea or vomiting or other somatic symptoms.  Dissociation did occur and she gradually saw resolution over the 2-hour period of observation.  She does not typically find the dissociation very strong. No SE complaints with meds. Seeing noticeable improvement from increase fluvoxamine to 400 mg daily.  Tolerating meds without concerns over them. She is confident of her the improvement seen with Spravato.  She is less hopeless.  Guilt is marked remarkably improved.  She is not having any thoughts of death or dying.  She is more motivated for activities such as exercise which she is recently started.  She is sleeping well. The OCD remains severe but it is improving somewhat with the increase in fluvoxamine.  It is still consuming a couple hours per day.  01/10/22 apravato 84 admin  01/24/22 appt noted: Patient received Spravato 84 mg today.  She tolerated it well without any unusual headache, nausea or vomiting or other somatic symptoms.  Dissociation did occur and she gradually saw resolution over the 2-hour period of observation.  She does not typically find the dissociation  very strong. No SE complaints with meds. Very tearful today.  Feels like she has been suppressing emotion in the Spravato caused it to be released.  Discussed some stressors.  Overall still feels the medicine is helpful.  She has missed some of the scheduled Spravato treatments that were intended to be weekly due to circumstances beyond her control.  She is still struggling with OCD as previously noted but does believe the medications are helpful. Plan no med changes  01/31/2022 received Spravato 84 mg today  02/09/2022 appointment with the following noted: Patient received Spravato 84 mg today.  She tolerated it well without any unusual headache, nausea or vomiting or other somatic symptoms.  Dissociation did occur and she gradually saw resolution over the 2-hour period of observation.  She does not typically find the dissociation very strong. No SE complaints with meds. Spravato clearly helps depression and OCD but easily gets overwhelmed and tearful with fairly routine stressors.  Tolerating meds. Sleep and appetite is OK Asks to increase lorazepam to 2 mg AM and HS and 35m afternoon  02/16/22 appt noted: Patient received Spravato 84 mg today.  She tolerated it well without any unusual headache, nausea or vomiting or other somatic symptoms.  Dissociation did occur and she gradually saw resolution over the 2-hour period of observation.  She does not typically find the dissociation very strong. No SE complaints with meds. She has chronic depesssion and OCD but is improved with Spravato, both dx versus before.  She has continued Luvox 400 mg and Wellbutrin 450 mg and is tolerating it.  Chronically easily stressed.  Tolerating all meds.  Doesn't like taking more meds.  Spending a couple hours daily with OCD.  No SI No med changes.  02/21/22 appt noted:   Doesn't like taking more meds.  Spending a couple hours daily with OCD.  No SI No med changes.  02/21/22 appt noted: Patient received Spravato 84 mg  today.  She tolerated it well without any unusual headache, nausea or vomiting or other somatic symptoms.  Dissociation did occur and she gradually saw resolution over the 2-hour period of observation.  She does not typically find the dissociation very strong. No SE complaints with meds. She has chronic depesssion and OCD but is improved with Spravato, both dx versus before.  She has continued Luvox 400 mg and Wellbutrin 450 mg and is tolerating it.  Chronically easily overwhelmed and doesn't know why.  Tolerating all meds. Wants to continue meds.  03/16/22 appt noted: Patient received Spravato 84 mg today.  She tolerated it well without any unusual headache, nausea or vomiting or other somatic symptoms.  Dissociation did occur and she gradually saw resolution over the 2-hour period of observation.  She does not typically find the dissociation very strong. No SE complaints with meds. Overall she still feels the Spravato has been helpful not only for her depression but also for her OCD which was somewhat unexpected.  OCD is still significant but it is less severe than prior to starting Spravato.  She is tolerating Luvox 400 mg and Wellbutrin 450 mg.  We discussed possible med adjustments.  03/23/22 appt noted: Patient received Spravato 84 mg today.  She tolerated it well without any unusual headache, nausea or vomiting or other somatic symptoms.  Dissociation did occur and she gradually saw resolution over the 2-hour period of observation.  She does not typically find the dissociation very strong. No SE complaints with meds. She is still depressed and still has OCD of course but is improved with the Spravato.  She is tolerating the medications well.  We had previously discussed the possibility of switching some of the Wellbutrin to [facility] and she is very interested in that in hopes of further improvement in depression and OCD.  She understands that Auvelity is not used for OCD on the label.  She is  tolerating the medications.  She is still easily overwhelmed.  She is sleeping and eating okay.. Plan: Reduce Wellbutrin XL to 300 mg AM and add Auvelity 1 tablet each AM  03/30/22 appt noted: Patient received Spravato 84 mg today.  She tolerated it well without any unusual headache, nausea or vomiting or other somatic symptoms.  Dissociation did occur and she gradually saw resolution over the 2-hour period of observation.  She does not typically find the dissociation very strong. No SE complaints with meds. She is still depressed and still has OCD of course but is improved with the Spravato.  She is tolerating the medications well.  No difference with Auvelity 1 AM so far and no SE.  Going on vacation on Saturday. 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 Sleep and appetite good. Plan: Increase Auvelity to 1 twice daily and  reduce Wellbutrin to XL 150 every morning  04/14/2022 appointment with the following noted: Patient received Spravato 84 mg today.  She tolerated it well without any unusual headache, nausea or vomiting or other somatic symptoms.  Dissociation did occur and she gradually saw resolution over the 2-hour period of observation.  She does not typically find the dissociation very strong. No SE complaints with meds. She is still depressed and still has OCD of course but is improved with the Spravato.  She is tolerating the medications well.  Just increased Auvelity to BID yesterday and reduced Wellbutrin to 150 AM. No SE so far.  No change in mood or anxiety so far.  Chronic OCD as noted and residual depression and chronic fatigue.  04/21/2022 appointment with the following noted: Patient received Spravato 84 mg today.  She tolerated it well without any unusual headache, nausea or vomiting or other somatic symptoms.  Dissociation did occur and she gradually saw resolution over the 2-hour period of observation.  She does not typically find the dissociation very strong. No SE  complaints with meds. She is still depressed and still has OCD of course but is improved with the Spravato.   She has questions about the dosing of lorazepam. She tends to have negative anxious thoughts at night.  This tends to interfere with her ability to go to sleep.  She is getting about 8 to 9 hours of sleep.  She is tolerating the meds without excessive sedation and does not nap during the day.  05/06/22 appt noted: Patient received Spravato 84 mg today.  She tolerated it well without any unusual headache, nausea or vomiting or other somatic symptoms.  Dissociation did occur and she gradually saw resolution over the 2-hour period of observation.  She does not typically find the dissociation very strong. No SE complaints with meds. She is still depressed and still has OCD of course but is improved with the Spravato.   Had some questions about timing of dosing of fluvoxamine and Auvelity. OCD is not quite as time consuming.  Sleep and eating are the same.   No SE meds.  05/25/22 appt noted: Patient received Spravato 84 mg today.  She tolerated it well without any unusual headache, nausea or vomiting or other somatic symptoms.  Dissociation did occur and she gradually saw resolution over the 2-hour period of observation.  She does not typically find the dissociation very strong. No SE complaints with meds. She is still depressed and still has OCD of course but is improved with the Spravato.   She has less OCD when away from home and on vacation of note. Plan: Rec gradually reduce HS lorazepam to 1 mg Hs.  Can continue lorazepam 2 mg AM and 1 mg in afternoon bc of chronic anxiety and it is helpful and tolerated. She can continue temazepam 30 mg nightly.  She tends to have a lot of anxious negative thoughts at night when she is trying to go to bed  06/16/22 appt noted: Patient received Spravato 84 mg today.  She tolerated it well without any unusual headache, nausea or vomiting or other somatic  symptoms.  Dissociation did occur and she gradually saw resolution over the 2-hour period of observation.  She does not typically find the dissociation very strong. No SE complaints with meds. She is still depressed and still has OCD of course but is improved with the Spravato.   She has less OCD when away from home and on vacation of note. She  is tolerating the medications.  She has continued current medications. 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  She still receives benefit from [place].  Her depression and OCD are better with the Spravato.  She thinks she is getting additional benefit with the switch from Wellbutrin to [place].  07/04/2022 appointment noted: Reports she developed a rash on her face from [facility] and feels like she is allergic to it.  She stopped it and went back to Wellbutrin 450 mg every morning.  The rash has cleared up.  She did not require any medical attention and did not have shortness of breath. Overall her depression and OCD are about the same as they have been.  She did not notice a substantial difference from the brief treatment with Auvelity but she understands she did not take a full course.  She is tolerating the current medicines well. Current meds fluvoxamine 400 mg daily, Wellbutrin XL 450 mg daily, lorazepam 1 to 2 mg in the morning and 1 to 2 mg at night and 1 mg in the afternoon, temazepam 30 mg nightly. She wants to continue the Spravato because she feels it has been helpful for both her depression and her racing OCD  07/18/22 appt noted: Patient received Spravato 84 mg today.  She tolerated it well without any unusual headache, nausea or vomiting or other somatic  symptoms.  Dissociation did occur and she gradually saw resolution over the 2-hour period of observation.  She does not typically find the dissociation very strong. No SE complaints with meds. She is still depressed and still has OCD of course but is improved with the Spravato.  Rash better off Auvelity and back on Welllbutrin XL 450 mg AM, fluvoxamine 400 mg daily.  08/15/22 appt noted: Current psych meds: Wellbutrin XL 450 mg AM, fluvoxamine 100 mg in AM and 300 mg HS, lorazepam 1 mg 1-2 mg in the AM and HS and 1 tablet prn midday for anxiety, temazepam 30 mg HS Patient received Spravato 84 mg today.  She tolerated it well without any unusual headache, nausea or vomiting or other somatic symptoms.  Dissociation did occur and she gradually saw resolution over the 2-hour period of observation.  She does not typically find the dissociation very strong. No SE complaints with meds. She has a great deal of stress dealing with her family.  Disc brother's ongoing mania and difficulty getting him help and the stress he causes for the family. She wants to continue Spravato through this very stressful holdicay season and reevaluate the frequency after the New Year.  09/12/22 appt noted: Current psych meds: Wellbutrin XL 450 mg AM, fluvoxamine 100 mg in AM and 300 mg HS, lorazepam 1 mg 1-2 mg in the AM and HS and 1 tablet prn midday for anxiety, temazepam 30 mg HS Patient received Spravato 84 mg today.  She tolerated it well without any unusual headache, nausea or vomiting or other somatic symptoms.  Dissociation did occur and she gradually saw resolution over the 2-hour period of observation.  She does not typically find the dissociation very strong. No SE complaints with meds. She has a great deal of stress dealing with her family.  Disc brother's ongoing mania and difficulty getting him help and the stress he causes for the family. She wants to continue Spravato through this very stressful holdicay season  and reevaluate the frequency after the New Year.  Chronically easily overwhelmed with family. Complaining of HA and history migraine.  Asks for increase imitrex and disc preventatives like propranolol ER  09/26/22 appt noted: Current psych meds: Wellbutrin XL 450 mg AM, fluvoxamine 100 mg in AM and 300 mg HS, lorazepam 1 mg 1-2 mg in the AM and HS and 1 tablet prn midday for anxiety, temazepam 30 mg HS Patient received Spravato 84 mg today.  She tolerated it well without any unusual headache, nausea or vomiting or other somatic symptoms.  Dissociation did occur and she gradually saw resolution over the 2-hour period of observation.  She does not typically find the dissociation very strong. No SE complaints with meds. She has a great deal of stress dealing with her family.  This is ongoing The holidays are much more stressful DT family problems.  She is noting OCD is much worse over the last couple of week.  Depression is better with Spravato. Needed higher dose meds for migraine.   Previous psych med trials include Prozac, paroxetine, sertraline, fluvoxamine, venlafaxine, Anafranil with no response,  Wellbutrin, , Viibryd, Trintellix 10 1 month NR Auvelity BID NR Geodon,  risperidone, Rexulti, Abilify,  Seroquel, Latuda 40 mg with irritability.  lamotrigine lithium,  BuSpar, Namenda,  pramipexole with no response, and Topamax, pindolol  ECT-MADRS    [place] Office Visit from 06/29/2021 in [place] Total Score 36      Flowsheet Row Admission (Discharged) from 06/11/2021 in [place] No Risk        Review of Systems:  Review of Systems  Constitutional:  Positive for fatigue.  Cardiovascular:  Negative for palpitations.  Musculoskeletal:  Positive for arthralgias, back pain, gait problem and neck pain. Negative for joint swelling.  Neurological:  Positive for weakness and headaches. Negative for tremors.   Psychiatric/Behavioral:  Positive for dysphoric mood. Negative for suicidal ideas. The patient is nervous/anxious.     Medications: I have reviewed the patient's current medications.  Current Outpatient Medications  Medication Sig Dispense Refill   Abaloparatide (TYMLOS) 3120 MCG/1.56ML SOPN Inject into the skin.     Azelastine-Fluticasone 137-50 MCG/ACT SUSP Place 1-2 sprays into both nostrils daily.     baclofen (LIORESAL) 10 MG tablet Take 20 mg by mouth at bedtime as needed for muscle spasms.     buPROPion (WELLBUTRIN XL) 150 MG 24 hr tablet TAKE 3 TABLETS BY MOUTH DAILY (Patient taking differently: Take 450 mg by mouth daily.) 270 tablet 0   dicyclomine (BENTYL) 10 MG capsule Take 10 mg by mouth daily.     docusate sodium (COLACE) 100 MG capsule Take 1 capsule (100 mg total) by mouth 2 (two) times daily. (Patient taking differently: Take 100 mg by mouth daily.) 10 capsule 0   Esketamine HCl, 84 MG Dose, (SPRAVATO, 84 MG DOSE,) 28 MG/DEVICE SOPK USE 3 SPRAYS IN EACH NOSTRIL EVERY OTHER WEEK 3 each 3   fexofenadine (ALLEGRA) 180 MG tablet Take 180 mg by mouth daily.     fluvoxaMINE (LUVOX) 100 MG tablet 1 tablet in the AM and 3 tablets in the evening 360 tablet 0   hydrocortisone (ANUSOL-HC) 2.5 % rectal cream Place rectally 2 (two) times daily. x 7-14 days 30 g 0   ketotifen (  ZADITOR) 0.025 % ophthalmic solution Place 3 drops into both eyes at bedtime.     LORazepam (ATIVAN) 1 MG tablet TAKE 1-2 IN THE AM AND 1-2 TABLETS EVERY NIGHT AT BEDTIME AND 1 TABLET IN AFTERNOON when needed for anxiety and sleep 150 tablet 1   magnesium gluconate (MAGONATE) 500 MG tablet Take 500 mg by mouth daily.     MIBELAS 24 FE 1-20 MG-MCG(24) CHEW Chew 1 tablet by mouth at bedtime as needed (bowel regularity).     Multiple Vitamins-Minerals (ADULT GUMMY PO) Take 2 tablets by mouth in the morning.     nitrofurantoin (MACRODANTIN) 100 MG capsule Take 100 mg by mouth as needed (For urinary tract infection.).       oxyCODONE-acetaminophen (PERCOCET/ROXICET) 5-325 MG tablet Take 1-2 tablets by mouth every 6 (six) hours as needed for severe pain. 50 tablet 0   polyethylene glycol (MIRALAX / GLYCOLAX) packet Take 17 g by mouth daily as needed for mild constipation. 14 each 0   propranolol ER (INDERAL LA) 60 MG 24 hr capsule Take 1 capsule (60 mg total) by mouth daily. 30 capsule 0   psyllium (METAMUCIL) 58.6 % powder Take 1 packet by mouth daily as needed (constipation).     SUMAtriptan (IMITREX) 100 MG tablet Take 1 tablet (100 mg total) by mouth every 2 (two) hours as needed for migraine. May repeat in 2 hours if headache persists or recurs. 10 tablet 1   temazepam (RESTORIL) 30 MG capsule TAKE 1 CAPSULE BY MOUTH AT BEDTIME AS NEEDED FOR SLEEP. 30 capsule 2   Vitamin D-Vitamin K (VITAMIN K2-VITAMIN D3 PO) Take 1-2 sprays by mouth daily.     No current facility-administered medications for this visit.    Medication Side Effects: None   Allergies:  Allergies  Allergen Reactions   Hydrocodone Itching   Sulfamethoxazole-Trimethoprim Itching   Dust Mite Extract Other (See Comments)    Sneezing, watery eyes, runny nose   Latex Itching   Other Other (See Comments)    PT IS ALLERGIC TO CAT DANDER AND RAGWEED - Sneezing, watery eyes, runny nose    Pollen Extract Other (See Comments)    Sneezing, watery eyes, runny nose     Past Medical History:  Diagnosis Date   Abnormal Pap smear 2011   hpv/mild dysplasia,cin1   Anxiety    Cerebral palsy (HCC)    right arm/leg   Cystocele    Depression    Headache    Neuromuscular disorder (HCC)    Cerebral Palsy   OCD (obsessive compulsive disorder)    Osteoporosis    Uterine prolaps     Family History  Problem Relation Age of Onset   Cancer Father        skin AND LUNG   Alcohol abuse Sister        CRACK COCAINE    Social History   Socioeconomic History   Marital status: Married    Spouse name: Not on file   Number of children: Not on file    Years of education: Not on file   Highest education level: Not on file  Occupational History   Not on file  Tobacco Use   Smoking status: Never   Smokeless tobacco: Never  Substance and Sexual Activity   Alcohol use: Not Currently    Comment: OCCASIONAL beer   Drug use: No   Sexual activity: Yes    Birth control/protection: Pill    Comment: LOESTRIN 24 FE  Other Topics Concern  Not on file  Social History Narrative   Not on file   Social Determinants of Health   Financial Resource Strain: Not on file  Food Insecurity: Not on file  Transportation Needs: Not on file  Physical Activity: Not on file  Stress: Not on file  Social Connections: Not on file  Intimate Partner Violence: Not on file    Past Medical History, Surgical history, Social history, and Family history were reviewed and updated as appropriate.   Please see review of systems for further details on the patient's review from today.   Objective:   Physical Exam:  LMP  (LMP Unknown)   Physical Exam Constitutional:      General: She is not in acute distress. Neurological:     Mental Status: She is alert and oriented to person, place, and time.     Cranial Nerves: No dysarthria.     Motor: Weakness present.     Gait: Gait abnormal.  Psychiatric:        Attention and Perception: Attention and perception normal.        Mood and Affect: Mood is anxious and depressed. Affect is not labile.        Speech: Speech normal.        Behavior: Behavior normal. Behavior is cooperative.        Thought Content: Thought content normal. Thought content is not delusional. Thought content does not include homicidal or suicidal ideation. Thought content does not include suicidal plan.        Cognition and Memory: Cognition and memory normal. Cognition is not impaired.        Judgment: Judgment normal.     Comments: Insight intact Ongoing OCD remains fairly severe but less anxious Checking compulsions up to 2 hours daily  but improved noticeably Chronic depression persistent but better with Spravato More stressed.      Lab Review:     Component Value Date/Time   NA 138 06/11/2021 0606   K 4.0 06/11/2021 0606   CL 107 06/11/2021 0606   CO2 26 06/11/2021 0606   GLUCOSE 90 06/11/2021 0606   BUN 18 06/11/2021 0606   CREATININE 0.81 06/11/2021 0606   CALCIUM 9.4 06/11/2021 0606   PROT 6.5 06/11/2021 0606   ALBUMIN 3.3 (L) 06/11/2021 0606   AST 17 06/11/2021 0606   ALT 14 06/11/2021 0606   ALKPHOS 141 (H) 06/11/2021 0606   BILITOT 0.2 (L) 06/11/2021 0606   GFRNONAA >60 06/11/2021 0606   GFRAA >60 07/09/2016 0438       Component Value Date/Time   WBC 5.8 06/11/2021 0606   RBC 4.12 06/11/2021 0606   HGB 12.5 06/11/2021 0606   HCT 39.7 06/11/2021 0606   PLT 299 06/11/2021 0606   MCV 96.4 06/11/2021 0606   MCH 30.3 06/11/2021 0606   MCHC 31.5 06/11/2021 0606   RDW 13.9 06/11/2021 0606   LYMPHSABS 1.9 06/11/2021 0606   MONOABS 0.5 06/11/2021 0606   EOSABS 0.1 06/11/2021 0606   BASOSABS 0.0 06/11/2021 0606    No results found for: "POCLITH", "LITHIUM"   No results found for: "PHENYTOIN", "PHENOBARB", "VALPROATE", "CBMZ"   .res Assessment: Plan:    Carisma was seen today for follow-up, depression, anxiety, fatigue and add.  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  Impaired function but less so with Spravato re: depression..   She is receiving Spravato 84 mg weekly and marked improvement in the depression..  she feels it also helps OCD somewhat.  However still easily overwhelmed with low stress tolerance.  The OCD is improved with the increase in fluvoxamine and with Spravato.  Spends 2 hours daily and checking compulsions on her  worst days but better when she travels.  She has been on higher doses of fluvoxamine above the usual max of 400 mg daily in the past.  This became difficult to obtain at 1 point and the dose was reduced to 300 mg daily.   Disc SE. She would like to continue Luvox 400 mg daily again    She is tolerating the meds well  Continue  Luvox back to 400 mg nightly as of January 2023. Disc dosing higher than usual.  She feels this is increase has helped more with OCD which remains chronically severe.  Rash better off Auvelity Per her request continue Wellbutrin XL 450 mg every morning.  Disc Spravato DT TRD incl details and SE. Disc dosing and duration.  Pt with severe depression MADRS 36 on 06/29/21  Patient was administered Spravato 84 mg intranasally dosage today.  The patient experienced the typical dissociation which gradually resolved over the 2-hour period of observation.  There were no complications.  Specifically the patient did not have nausea or vomiting or headache.  Blood pressures remained within normal ranges at the 40-minute and 2-hour follow-up intervals.  By the time the 2-hour observation period was met the patient was alert and oriented and able to exit without assistance. She tends to have lingering sedative effects but not severe. .  See nursing note for further details. Per protocol will continue Spravato to 84 mg weekly.  She has been okay since cutting back to once weekly.  We discussed the short-term risks associated with benzodiazepines including sedation and increased fall risk among others.  Discussed long-term side effect risk including dependence, potential withdrawal symptoms, and the potential eventual dose-related risk of dementia.  But recent studies from 2020 dispute this association between benzodiazepines and dementia risk. Newer studies in 2020 do not support an association with dementia. Disc this is high dose and not ideal.  Also disc risk combining it with  temazepam. Rec gradually reduce HS lorazepam to 1 mg Hs.  Can continue lorazepam 2 mg AM and 1 mg in afternoon bc of chronic anxiety and it is helpful and tolerated. She can continue temazepam 30 mg nightly.  She tends to have a lot of anxious negative thoughts at night when she is trying to go to bed.  She is trying to reduce the dose.  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  Complaining of HA and history migraine.  Asks for increase imitrex and disc preventatives like propranolol ER Ok increase imitrex to 100 mg prn migraine and start propranolol ER for migraine prevention.  Supportive therapy dealing with some of the recent stressors including family stressors and her own health related to cerebral palsy   No other med changes today  Follow-up every other week   Lynder Parents, MD, DFAPA  Please see After Visit Summary for patient specific instructions.  Future Appointments  Date Time Provider [place]  10/26/2022 11:00 AM Blanchie Serve, PhD CP-CP None  11/08/2022 11:00 AM Blanchie Serve, PhD CP-CP None  11/21/2022 11:00 AM Mitchum, Herbie [place],  PhD CP-CP None  12/20/2022 11:00 AM Mitchum, Herbie [place], PhD CP-CP None    No orders of the defined types were placed in this encounter.    -------------------------------

## 2022-10-03 ENCOUNTER — Ambulatory Visit: Payer: 59

## 2022-10-03 ENCOUNTER — Ambulatory Visit (INDEPENDENT_AMBULATORY_CARE_PROVIDER_SITE_OTHER): Payer: 59 | Admitting: Psychiatry

## 2022-10-03 VITALS — BP 88/74 | HR 91

## 2022-10-03 DIAGNOSIS — F422 Mixed obsessional thoughts and acts: Secondary | ICD-10-CM

## 2022-10-03 DIAGNOSIS — F5105 Insomnia due to other mental disorder: Secondary | ICD-10-CM

## 2022-10-03 DIAGNOSIS — G43009 Migraine without aura, not intractable, without status migrainosus: Secondary | ICD-10-CM

## 2022-10-03 DIAGNOSIS — F339 Major depressive disorder, recurrent, unspecified: Secondary | ICD-10-CM | POA: Diagnosis not present

## 2022-10-03 DIAGNOSIS — F4010 Social phobia, unspecified: Secondary | ICD-10-CM

## 2022-10-03 NOTE — Progress Notes (Signed)
NURSE Visit:   Pt arrived for her weekly Spravato Treatment, she started Spravato treatments on 10/07/2021, she continues with 84 mg (3 of the 28 mg) Spravato nasal spray once a week treatments, which is the maintenance dose for her treatment resistant depression. This is treatment #42.   She was directed to the treatment room to get vitals taken first. Initial vital signs are B/P at 3:25 PM 108/70, 88, Pt instructed to blow her nose and to recline back at 45 degrees. Pt given first nasal spray (28 mg) administered by pt observed by nurse. Pt always has to go to the restroom after her first dose, at that point she is not too medicated to walk to bathroom safely.There were 5 minutes between each dose, total of 84 mg. Tolerated well. Pt's medication is delivered by [facility] in [place] and stored inside a safe behind a locked door as well. Spravato is a CIII medication and has to be only given at a treatment facility and observed by nurse as pt administered intranasally.  Pt's 40 minute vital signs at 4:05 PM 98/64, 78. Dr. Clovis Pu met with pt to discuss her care at the end of her treatment when her thoughts are clearer and they discussed her medication and moods. She does go to the bathroom at least once during her treatment and again towards the end when she is clearer and able to walk. No sedation and had slight feeling of being "high" she reports. Discharge vitals at 5:25 PM 119/88, 82. Pt stable for discharge.  Pt was observed on site a total of 120 minutes per FDA/REMS requirements. Pt was with nurse for clinical assessment 50 minutes. Pt next visit is on December 18th, Monday.    [id number]   EXP AUG 2026

## 2022-10-06 ENCOUNTER — Other Ambulatory Visit: Payer: Self-pay | Admitting: Psychiatry

## 2022-10-06 DIAGNOSIS — G43009 Migraine without aura, not intractable, without status migrainosus: Secondary | ICD-10-CM

## 2022-10-14 ENCOUNTER — Other Ambulatory Visit: Payer: Self-pay

## 2022-10-14 MED ORDER — SPRAVATO (84 MG DOSE) 28 MG/DEVICE NA SOPK: PACK | NASAL | 3 refills | Status: DC

## 2022-10-16 ENCOUNTER — Encounter: Payer: Self-pay | Admitting: Psychiatry

## 2022-10-16 NOTE — Progress Notes (Signed)
Casey Diaz [id number] [birth year] 56 y.o.    Subjective:   Patient ID:  Casey Diaz is a 56 y.o. (DOB [birth year]) female.  Chief Complaint:  Chief Complaint  Patient presents with   Follow-up   Depression   Anxiety   Stress     HPI Casey Diaz presents to the office today for follow-up of OCD and severe anxiety.     December 2019 visit the following was noted: No meds were changed. Lives in [place] and back for followup.  Sx are about the same.  Has to take meds with different sizes. Pt reports that mood is Anxious and Depressed and describes anxiety as Severe. Anxiety symptoms include: Excessive Worry, Obsessive Compulsive Symptoms:   Checking,,. Pt reports has interrupted sleep and nocturia. Pt reports that appetite is good. Pt reports that energy is no change and down slightly. Concentration is down slightly. Suicidal thoughts:  denied by patient. Loves the environment of [place] but misses some things there.  She's not able to work there.  H works there and likes it.  Struggled with not working, feels isolated and not up to task of meeting people.  Does attend a church and met a friend who's been helpful.  Leaving for [place] on 10/16/18.   04/09/2020 appointment the following is noted:  Staying another year in [place] bc Covid and other things. Last few months a lot of crying spells.  Is in menopause. Wonders about med changes though is nervous about it.  Crying spells associated with depressing thoughts more than stress or OCD.   Covid really hard on everyone and couldn't see family for 18 mos.  Family still very dysfunctional. No close friends in part due to OCD and depression. Son high Autism spectrum with ADHD and anxiety and she's with him all the time. Greater health problems with CP so more pains.   05/15/20 appt with the following noted: Casey Diaz for menopause and helps some. Still depressed.  Chronically. In [place] for 2 more weeks then to [place] for  another year. A lot of stressors lately triggering more checking and anxiety.   OCD is her CC now and seems.  Got worse DT stress.   Stressed with Asberger's son and her health.  H works a lot.  Her FOO still stress. Plan: Trintellix 10 mg 1 tablet in the morning with food and reduce fluvoxamine to 5 tablets nightly for 1 week  then reduce it to 4 tablets nightly.   07/02/20 appt with the following noted: Decided not to get Trintellix bc difficulty getting it. It is available.  There.  Wants to start it now.   Both depression and OCD are severe.  Not suicidal in intent or plan. Did not take samples with her to [place] but will be back in December. covid is worse there and travel is difficult.  Wants to reduce Wellbutrin DT dry mouth. Plan: She's afraid to reduce Luvox at this time DT fear of worsening OCD.  But will consider. Trintellix 10 mg 1 tablet in the morning with food and reduce fluvoxamine to 5 tablets nightly for 1 week  then reduce it to 4 tablets nightly. Also reduce Wellbutrin XL to 300 mg daily.    9-13 2022 appointment with the following noted: Back in [place] since July 14.  Broke arm a month ago and surgery.  It's all been rough adjustment.   Casey Diaz has cancer on his face and Casey Diaz fell taking him to the doctor.  Misses the water and weather of [place].   Cry a lot more since menopause. Still depression and anxiety and OCD.  Asks about ketamine. On Wellbutrin 300, Luvox 300.  No Trintellix. Added Ativan 2 mg AM and HS and it helps.  More likely to get upset at night. Plan: Increase Luvox back to 400 mg daily.  She thinks she's worse on less. Continue Wellbutrin XL to 300 mg daily. 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  She tolerated it well without unusual nausea or vomiting headache or other somatic symptoms.  She did have the expected dissociation which gradually resolved over  the course of the 2-hour period of observation.  She was a little concerned about her balance given her cerebral palsy but has not noted unusual or unexpected problems.  She is motivated to can continue Spravato in hopes of reducing her depressive symptoms. 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  She also has treatment resistant OCD which is partially managed with medications but is still quite disabling.  She is tolerating the medications well.  She is sleeping adequately.  Her appetite is adequate.  She is not having suicidal thoughts.  She continues to wish for a better treatment for OCD that would give her some relief.  09/30/2021 appointment with the following noted: She received her first dose of Spravato 84 mg intranasally today.  She tolerated it well without unusual nausea, vomiting, or other somatic symptoms.  Dissociation as expected did occur and gradually resolved over the 2-hour period of observation.  She did have a mild headache today with the treatment and received ibuprofen 600 mg at her request.  We will follow this to see if it is a pattern Patient is still depressed.  She said she was late with her medicine today and today was a particularly depressing day.  However she notes that the Spravato has lifted her mood considerably even today.  She is hopeful that it will continue to be helpful.  No suicidal thoughts.  She has ongoing chronic anxiety and OCD at baseline.  10/04/21 appt noted: Patient received Spravato 84 mg for the second time today.  She tolerated it well without any unusual headache, nausea or vomiting or other somatic symptoms.  Dissociation did occur and she gradually [place] resolution over the 2-hour period of observation. She did not have any unusual problems after she left the office last Spravato administration.  She did not have any specific problems with balance or walking.  She is at increased risk of that difficulty because of  cerebral palsy.  So far she has not noticed much mood effect from the medication beyond the first day of receiving it.  However she would like to continue Spravato in hopes of getting the antidepressant effect that is desired. Stress dealing with mother's behavior at party pt hosted.  Guilt over it.  10/07/2021 appointment noted: Patient received Spravato 84 mg for the second time today.  She tolerated it well without any unusual headache, nausea or vomiting or other somatic symptoms.  Dissociation did occur and she gradually [place] resolution over the 2-hour period of observation. She still is not sure about the antidepressant effect of Spravato.  Events over the holidays and demands, make it difficult to assess.  She still notes that the OCD tends to worsen the depression and vice versa.  She tolerates the  Spravato well and wants to continue the trial.  10/15/2021 appointment with the following noted: Patient received Spravato 84 mg for the second time today.  She tolerated it well without any unusual headache, nausea or vomiting or other somatic symptoms.  Dissociation did occur and she gradually [place] resolution over the 2-hour period of observation. Patient says it was somewhat difficult to evaluate the effect of the Spravato.  It was scheduled to be twice weekly for 4 weeks consecutively but the holidays have interfered with that administration.  She asked what specifically should be she should be looking for in order to assess improvement.  That was discussed.  The OCD is unchanged and the depression so far is not significantly different.  She still tolerates meds.  There have been no recent med changes  10/19/2021 appt noted: Patient received Spravato 84 mg for the second today.  She tolerated it well without any unusual headache, nausea or vomiting or other somatic symptoms.  Dissociation did occur and she gradually saw resolution over the 2-hour period of observation.   10/21/2021 appointment  noted: Patient received Spravato 84 mg today.  She tolerated it well without any unusual headache, nausea or vomiting or other somatic symptoms.  Dissociation did occur and she gradually saw resolution over the 2-hour period of observation.  She feels better than last week.  She is not as depressed and down.  She is still dealing with grief around the death of her cousin that was unexpected.  It is still difficult to tell how much the Spravato was doing but she is hopeful.  Anxiety is still present with the OCD.  She is not having suicidal thoughts.  She is not hopeless.  She wants to continue treatment.  10/25/2021 appointment with the following noted: Patient received Spravato 84 mg today.  She tolerated it well without any unusual headache, nausea or vomiting or other somatic symptoms.  Dissociation did occur and she gradually saw resolution over the 2-hour period of observation.  She does not typically find the dissociation very strong. She is beginning to think the Spravato is helping somewhat with the depression.  It has been difficult to tell with the holidays intervening as well as the death of her cousin.  She has not been able to get Spravato twice weekly for 4 weeks straight as typically planned.  However she is hopeful.  The OCD remains significant.  She still has a tendency to think very negatively.  She is not suicidal.  10/28/2021 appointment with the following noted: Patient received Spravato 84 mg today.  She tolerated it well without any unusual headache, nausea or vomiting or other somatic symptoms.  Dissociation did occur and she gradually saw resolution over the 2-hour period of observation.  She does not typically find the dissociation very strong. She is feeling more hopeful about the administration of Spravato.  She is having less depression she believes.  Still not dramatically different.  She still has a tendency to have a lot of anxiety and rumination and OCD.  She is not suicidal.   She is eager to continue the Spravato.  11/01/2021 appointment with the following noted: Patient received Spravato 84 mg today.  She tolerated it well without any unusual headache, nausea or vomiting or other somatic symptoms.  Dissociation did occur and she gradually saw resolution over the 2-hour period of observation.  She does not typically find the dissociation very strong. She is continuing to see a little bit of improvement in depression  with Spravato.  The anxiety remains but may be not as severe.  The OCD remains markedly severe chronically.  She is not suicidal.  She is encouraged by the degree of improvement with Spravato and inability to enjoy things more and not be quite as ruminative.  11/04/2021 appt noted: Patient received Spravato 84 mg today.  She tolerated it well without any unusual headache, nausea or vomiting or other somatic symptoms.  Dissociation did occur and she gradually saw resolution over the 2-hour period of observation.  She does not typically find the dissociation very strong. No SE complaints with meds. She continues to feel hopeful about the Spravato.  She has less depression.  Because of a number of factors she is uncertain of the full benefit but thinks she is somewhat less depressed.  Her anxiety and OCD remain significant but a little better.  She is tolerating the medications and does not desire medicine change.  She is not currently complaining of insomnia.   11/08/2021 appointment the following noted: Patient received Spravato 84 mg today.  She tolerated it well without any unusual headache, nausea or vomiting or other somatic symptoms.  Dissociation did occur and she gradually saw resolution over the 2-hour period of observation.  She does not typically find the dissociation very strong. No SE complaints with meds. She feels the Spravato is helping somewhat.  She would like to see a greater effect.  However she is able to enjoy things.  She is productive at home.   She would like to see a lifting of a degree of sadness that remains.  The anxiety and OCD remained largely unchanged.  She wondered about the dosing of Wellbutrin 300 mg a day and Luvox 300 mg a day and possible increases.  She has been at higher doses in the past.  She plans to start water therapy for her weakness and for her shoulder.  11/11/2021 appointment with the following noted: Patient received Spravato 84 mg today.  She tolerated it well without any unusual headache, nausea or vomiting or other somatic symptoms.  Dissociation did occur and she gradually saw resolution over the 2-hour period of observation.  She does not typically find the dissociation very strong. No SE complaints with meds. She feels the Spravato is clearly helping the depression.  She would like to see a more significant effect.  She is still having trouble thinking positive. Her energy is fair.  Concentration is good except for the problem with chronic obsessions. She has been taking Wellbutrin 300 mg in Luvox 300 mg for quite some time but has taken higher doses in the past.  We discussed that.  She would like to try higher doses in order to get a better effect if possible. We just increased the doses a couple of days ago.  No effect yet.  11/15/2021 appointment with the following noted: Patient received Spravato 84 mg today.  She tolerated it well without any unusual headache, nausea or vomiting or other somatic symptoms.  Dissociation did occur and she gradually saw resolution over the 2-hour period of observation.  She does not typically find the dissociation very strong. No SE complaints with meds. The patient is now convinced that the Spravato is helping the depression.  She would like to continue twice weekly Spravato this week if possible.  She has tolerated the increase in Wellbutrin to 450 mg daily and the increase and fluvoxamine to 400 mg daily without complications thus far.  The OCD and anxiety feed  the  depression to some extent. She spends approximately 2 hours daily with checking compulsions due to obsessions about causing harm to others.  For example fearing that when she has hit a pot hole that she may have hit a person and going back to check.  Checking corners and rooms out of fear that she may have harmed someone.  Other various checking compulsions.  She is hoping the increase in fluvoxamine to 400 mg will reduce that over the weeks to come.  She is not seeing a significant difference with the addition of the Spravato though she understands that was not expected.  She is more productive at home and more motivated and able to enjoy things more fully as a result of the Spravato treatment.  She is tolerating the medication  11/18/2021 appointment with the following noted: Patient received Spravato 84 mg today.  She tolerated it well without any unusual headache, nausea or vomiting or other somatic symptoms.  Dissociation did occur and she gradually saw resolution over the 2-hour period of observation.  She does not typically find the dissociation very strong. No SE complaints with meds. She clearly believes the Spravato has been helpful for the depression.  She wonders whether to continue to treatments weekly or to cut back to 1 weekly.  She would like to continue twice weekly in hopes of getting additional improvement in the depression because it is not resolved but it is difficult to get here twice a week in terms of arranging rides. She is recently increased Wellbutrin XL to 450 mg daily and fluvoxamine to 400 mg daily but they have not had time to have an official effect.  She is tolerating that well.  She is tolerating meds overwork overall well. The OCD remains the same as noted on 11/15/2021  11/25/21 appt noted: Patient received Spravato 84 mg today.  She tolerated it well without any unusual headache, nausea or vomiting or other somatic symptoms.  Dissociation did occur and she gradually saw  resolution over the 2-hour period of observation.  She does not typically find the dissociation very strong. No SE complaints with meds. She thinks the increase in Wellbutrin and Luvox have been potentially helpful for depression and OCD respectively.  It has been too early to see the full effect.  She is sleeping and eating well.  She is functioning at home.  She still spends a lot of time that is about 2 hours a day dealing with compulsive behaviors.  12/02/21 appt noted: Patient received Spravato 84 mg today.  She tolerated it well without any unusual headache, nausea or vomiting or other somatic symptoms.  Dissociation did occur and she gradually saw resolution over the 2-hour period of observation.  She does not typically find the dissociation very strong. No SE complaints with meds. Several losses and stressors recently that affect her sense of mood. However still sees significant benefit from the Spravato for her depression.  Wants to continue it. Suspect early  some benefit from the increased Wellbutrin for depression and Luvox for OCD. Tolerating meds. No complaints about the meds. Sleeping and eating well.  No new health concerns.  12/09/21 appt noted: Patient received Spravato 84 mg today.  She tolerated it well without any unusual headache, nausea or vomiting or other somatic symptoms.  Dissociation did occur and she gradually saw resolution over the 2-hour period of observation.  She does not typically find the dissociation very strong. No SE complaints with meds. Seeing noticeable improvement from  increase fluvoxamine to 400 mg daily.  Tolerating meds without concerns over them. Depression is stable with residual sx of easy guilt and easily stressed.  OCD contributes to depression but depression is not severe with less crying spells.  Productive at home with chores.  Enjoyed recent birthday.  Sleeping good. No new concerns.  12/23/2021 appointment noted: Patient received Spravato 84 mg  today.  She tolerated it well without any unusual headache, nausea or vomiting or other somatic symptoms.  Dissociation did occur and she gradually saw resolution over the 2-hour period of observation.  She does not typically find the dissociation very strong. No SE complaints with meds. Seeing noticeable improvement from increase fluvoxamine to 400 mg daily.  Tolerating meds without concerns over them. Her depression is somewhat improved with the Spravato.  She also feels generally a little lighter.  She is more motivated.  She is less overwhelmed by guilt.  The OCD is gradually improving but is still quite time-consuming as noted before.  She is sleeping well.  No side effects  12/30/2021 appointment with the following noted: Patient received Spravato 84 mg today.  She tolerated it well without any unusual headache, nausea or vomiting or other somatic symptoms.  Dissociation did occur and she gradually saw resolution over the 2-hour period of observation.  She does not typically find the dissociation very strong. No SE complaints with meds. Seeing noticeable improvement from increase fluvoxamine to 400 mg daily.  Tolerating meds without concerns over them. She is confident of her the improvement seen with Spravato.  She is less hopeless.  Guilt is marked remarkably improved.  She is not having any thoughts of death or dying.  She is more motivated for activities such as exercise which she is recently started.  She is sleeping well. The OCD remains severe but it is improving somewhat with the increase in fluvoxamine.  It is still consuming a couple hours per day.  01/10/22 apravato 84 admin  01/24/22 appt noted: Patient received Spravato 84 mg today.  She tolerated it well without any unusual headache, nausea or vomiting or other somatic symptoms.  Dissociation did occur and she gradually saw resolution over the 2-hour period of observation.  She does not typically find the dissociation very strong. No  SE complaints with meds. Very tearful today.  Feels like she has been suppressing emotion in the Spravato caused it to be released.  Discussed some stressors.  Overall still feels the medicine is helpful.  She has missed some of the scheduled Spravato treatments that were intended to be weekly due to circumstances beyond her control.  She is still struggling with OCD as previously noted but does believe the medications are helpful. Plan no med changes  01/31/2022 received Spravato 84 mg today  02/09/2022 appointment with the following noted: Patient received Spravato 84 mg today.  She tolerated it well without any unusual headache, nausea or vomiting or other somatic symptoms.  Dissociation did occur and she gradually saw resolution over the 2-hour period of observation.  She does not typically find the dissociation very strong. No SE complaints with meds. Spravato clearly helps depression and OCD but easily gets overwhelmed and tearful with fairly routine stressors.  Tolerating meds. Sleep and appetite is OK Asks to increase lorazepam to 2 mg AM and HS and 67m afternoon  02/16/22 appt noted: Patient received Spravato 84 mg today.  She tolerated it well without any unusual headache, nausea or vomiting or other somatic symptoms.  Dissociation did  occur and she gradually saw resolution over the 2-hour period of observation.  She does not typically find the dissociation very strong. No SE complaints with meds. She has chronic depesssion and OCD but is improved with Spravato, both dx versus before.  She has continued Luvox 400 mg and Wellbutrin 450 mg and is tolerating it.  Chronically easily stressed.  Tolerating all meds.  Doesn't like taking more meds.  Spending a couple hours daily with OCD.  No SI No med changes.  02/21/22 appt noted:   Doesn't like taking more meds.  Spending a couple hours daily with OCD.  No SI No med changes.  02/21/22 appt noted: Patient received Spravato 84 mg today.  She  tolerated it well without any unusual headache, nausea or vomiting or other somatic symptoms.  Dissociation did occur and she gradually saw resolution over the 2-hour period of observation.  She does not typically find the dissociation very strong. No SE complaints with meds. She has chronic depesssion and OCD but is improved with Spravato, both dx versus before.  She has continued Luvox 400 mg and Wellbutrin 450 mg and is tolerating it.  Chronically easily overwhelmed and doesn't know why.  Tolerating all meds. Wants to continue meds.  03/16/22 appt noted: Patient received Spravato 84 mg today.  She tolerated it well without any unusual headache, nausea or vomiting or other somatic symptoms.  Dissociation did occur and she gradually saw resolution over the 2-hour period of observation.  She does not typically find the dissociation very strong. No SE complaints with meds. Overall she still feels the Spravato has been helpful not only for her depression but also for her OCD which was somewhat unexpected.  OCD is still significant but it is less severe than prior to starting Spravato.  She is tolerating Luvox 400 mg and Wellbutrin 450 mg.  We discussed possible med adjustments.  03/23/22 appt noted: Patient received Spravato 84 mg today.  She tolerated it well without any unusual headache, nausea or vomiting or other somatic symptoms.  Dissociation did occur and she gradually saw resolution over the 2-hour period of observation.  She does not typically find the dissociation very strong. No SE complaints with meds. She is still depressed and still has OCD of course but is improved with the Spravato.  She is tolerating the medications well.  We had previously discussed the possibility of switching some of the Wellbutrin to [name] and she is very interested in that in hopes of further improvement in depression and OCD.  She understands that Auvelity is not used for OCD on the label.  She is tolerating the  medications.  She is still easily overwhelmed.  She is sleeping and eating okay.. Plan: Reduce Wellbutrin XL to 300 mg AM and add Auvelity 1 tablet each AM  03/30/22 appt noted: Patient received Spravato 84 mg today.  She tolerated it well without any unusual headache, nausea or vomiting or other somatic symptoms.  Dissociation did occur and she gradually saw resolution over the 2-hour period of observation.  She does not typically find the dissociation very strong. No SE complaints with meds. She is still depressed and still has OCD of course but is improved with the Spravato.  She is tolerating the medications well.  No difference with Auvelity 1 AM so far and no SE.  Going on vacation on Saturday. 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 Sleep and appetite good. Plan: Increase Auvelity to 1 twice daily and reduce Wellbutrin  to XL 150 every morning  04/14/2022 appointment with the following noted: Patient received Spravato 84 mg today.  She tolerated it well without any unusual headache, nausea or vomiting or other somatic symptoms.  Dissociation did occur and she gradually saw resolution over the 2-hour period of observation.  She does not typically find the dissociation very strong. No SE complaints with meds. She is still depressed and still has OCD of course but is improved with the Spravato.  She is tolerating the medications well.  Just increased Auvelity to BID yesterday and reduced Wellbutrin to 150 AM. No SE so far.  No change in mood or anxiety so far.  Chronic OCD as noted and residual depression and chronic fatigue.  04/21/2022 appointment with the following noted: Patient received Spravato 84 mg today.  She tolerated it well without any unusual headache, nausea or vomiting or other somatic symptoms.  Dissociation did occur and she gradually saw resolution over the 2-hour period of observation.  She does not typically find the dissociation very strong. No SE complaints with  meds. She is still depressed and still has OCD of course but is improved with the Spravato.   She has questions about the dosing of lorazepam. She tends to have negative anxious thoughts at night.  This tends to interfere with her ability to go to sleep.  She is getting about 8 to 9 hours of sleep.  She is tolerating the meds without excessive sedation and does not nap during the day.  05/06/22 appt noted: Patient received Spravato 84 mg today.  She tolerated it well without any unusual headache, nausea or vomiting or other somatic symptoms.  Dissociation did occur and she gradually saw resolution over the 2-hour period of observation.  She does not typically find the dissociation very strong. No SE complaints with meds. She is still depressed and still has OCD of course but is improved with the Spravato.   Had some questions about timing of dosing of fluvoxamine and Auvelity. OCD is not quite as time consuming.  Sleep and eating are the same.   No SE meds.  05/25/22 appt noted: Patient received Spravato 84 mg today.  She tolerated it well without any unusual headache, nausea or vomiting or other somatic symptoms.  Dissociation did occur and she gradually saw resolution over the 2-hour period of observation.  She does not typically find the dissociation very strong. No SE complaints with meds. She is still depressed and still has OCD of course but is improved with the Spravato.   She has less OCD when away from home and on vacation of note. Plan: Rec gradually reduce HS lorazepam to 1 mg Hs.  Can continue lorazepam 2 mg AM and 1 mg in afternoon bc of chronic anxiety and it is helpful and tolerated. She can continue temazepam 30 mg nightly.  She tends to have a lot of anxious negative thoughts at night when she is trying to go to bed  06/16/22 appt noted: Patient received Spravato 84 mg today.  She tolerated it well without any unusual headache, nausea or vomiting or other somatic symptoms.   Dissociation did occur and she gradually saw resolution over the 2-hour period of observation.  She does not typically find the dissociation very strong. No SE complaints with meds. She is still depressed and still has OCD of course but is improved with the Spravato.   She has less OCD when away from home and on vacation of note. She is tolerating  the medications.  She has continued current medications. 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  She still receives benefit from [place].  Her depression and OCD are better with the Spravato.  She thinks she is getting additional benefit with the switch from Wellbutrin to [place].  07/04/2022 appointment noted: Reports she developed a rash on her face from [facility] and feels like she is allergic to it.  She stopped it and went back to Wellbutrin 450 mg every morning.  The rash has cleared up.  She did not require any medical attention and did not have shortness of breath. Overall her depression and OCD are about the same as they have been.  She did not notice a substantial difference from the brief treatment with Auvelity but she understands she did not take a full course.  She is tolerating the current medicines well. Current meds fluvoxamine 400 mg daily, Wellbutrin XL 450 mg daily, lorazepam 1 to 2 mg in the morning and 1 to 2 mg at night and 1 mg in the afternoon, temazepam 30 mg nightly. She wants to continue the Spravato because she feels it has been helpful for both her depression and her racing OCD  07/18/22 appt noted: Patient received Spravato 84 mg today.  She tolerated it well without any unusual headache, nausea or vomiting or other somatic symptoms.   Dissociation did occur and she gradually saw resolution over the 2-hour period of observation.  She does not typically find the dissociation very strong. No SE complaints with meds. She is still depressed and still has OCD of course but is improved with the Spravato.  Rash better off Auvelity and back on Welllbutrin XL 450 mg AM, fluvoxamine 400 mg daily.  08/15/22 appt noted: Current psych meds: Wellbutrin XL 450 mg AM, fluvoxamine 100 mg in AM and 300 mg HS, lorazepam 1 mg 1-2 mg in the AM and HS and 1 tablet prn midday for anxiety, temazepam 30 mg HS Patient received Spravato 84 mg today.  She tolerated it well without any unusual headache, nausea or vomiting or other somatic symptoms.  Dissociation did occur and she gradually saw resolution over the 2-hour period of observation.  She does not typically find the dissociation very strong. No SE complaints with meds. She has a great deal of stress dealing with her family.  Disc brother's ongoing mania and difficulty getting him help and the stress he causes for the family. She wants to continue Spravato through this very stressful holdicay season and reevaluate the frequency after the New Year.  09/12/22 appt noted: Current psych meds: Wellbutrin XL 450 mg AM, fluvoxamine 100 mg in AM and 300 mg HS, lorazepam 1 mg 1-2 mg in the AM and HS and 1 tablet prn midday for anxiety, temazepam 30 mg HS Patient received Spravato 84 mg today.  She tolerated it well without any unusual headache, nausea or vomiting or other somatic symptoms.  Dissociation did occur and she gradually saw resolution over the 2-hour period of observation.  She does not typically find the dissociation very strong. No SE complaints with meds. She has a great deal of stress dealing with her family.  Disc brother's  ongoing mania and difficulty getting him help and the stress he causes for the family. She wants to continue Spravato through this very stressful holdicay season and  reevaluate the frequency after the New Year.  Chronically easily overwhelmed with family. Complaining of HA and history migraine.  Asks for increase imitrex and disc preventatives like propranolol ER  09/26/22 appt noted: Current psych meds: Wellbutrin XL 450 mg AM, fluvoxamine 100 mg in AM and 300 mg HS, lorazepam 1 mg 1-2 mg in the AM and HS and 1 tablet prn midday for anxiety, temazepam 30 mg HS Patient received Spravato 84 mg today.  She tolerated it well without any unusual headache, nausea or vomiting or other somatic symptoms.  Dissociation did occur and she gradually saw resolution over the 2-hour period of observation.  She does not typically find the dissociation very strong. No SE complaints with meds. She has a great deal of stress dealing with her family.  This is ongoing The holidays are much more stressful DT family problems.  She is noting OCD is much worse over the last couple of week.  Depression is better with Spravato. Needed higher dose meds for migraine.   10/03/22 appt noted: Current psych meds: Wellbutrin XL 450 mg AM, fluvoxamine 100 mg in AM and 300 mg HS, lorazepam 1 mg 1-2 mg in the AM and HS and 1 tablet prn midday for anxiety, temazepam 30 mg HS Patient received Spravato 84 mg today.  She tolerated it well without any unusual headache, nausea or vomiting or other somatic symptoms.  Dissociation did occur and she gradually saw resolution over the 2-hour period of observation.  She does not typically find the dissociation very strong. No SE complaints with meds. She has now realized that the rash she previously ultra attributed to Auvelity was not related.  She is interested may be retrying that after the holidays.  She is tolerating medications otherwise. The holidays remain chronically stressful to her due to family dynamic problems which cause her to consistently feel stuck.  Under more stress her OCD is worse.  She will have a tendency to have crying spells.  The  depression and OCD are still improved with Spravato as compared to before.  Previous psych med trials include Prozac, paroxetine, sertraline, fluvoxamine, venlafaxine, Anafranil with no response,  Wellbutrin, , Viibryd, Trintellix 10 1 month NR Auvelity BID NR Geodon,  risperidone, Rexulti, Abilify,  Seroquel, Latuda 40 mg with irritability.  lamotrigine lithium,  BuSpar, Namenda,  pramipexole with no response, and Topamax, pindolol  ECT-MADRS    [place] Office Visit from 06/29/2021 in [place] Total Score 36      Flowsheet Row Admission (Discharged) from 06/11/2021 in [place] No Risk        Review of Systems:  Review of Systems  Constitutional:  Positive for fatigue.  Cardiovascular:  Negative for palpitations.  Musculoskeletal:  Positive for arthralgias, back pain, gait problem and neck pain. Negative for joint swelling.  Neurological:  Positive for weakness and headaches. Negative for dizziness and tremors.  Psychiatric/Behavioral:  Positive for dysphoric mood. Negative for suicidal ideas. The patient is nervous/anxious.     Medications: I have reviewed the patient's current medications.  Current Outpatient Medications  Medication Sig Dispense Refill   Abaloparatide (TYMLOS) 3120 MCG/1.56ML SOPN Inject into the skin.     Azelastine-Fluticasone 137-50 MCG/ACT SUSP Place 1-2 sprays into both nostrils daily.     baclofen (  LIORESAL) 10 MG tablet Take 20 mg by mouth at bedtime as needed for muscle spasms.     buPROPion (WELLBUTRIN XL) 150 MG 24 hr tablet TAKE 3 TABLETS BY MOUTH DAILY 90 tablet 2   dicyclomine (BENTYL) 10 MG capsule Take 10 mg by mouth daily.     docusate sodium (COLACE) 100 MG capsule Take 1 capsule (100 mg total) by mouth 2 (two) times daily. (Patient taking differently: Take 100 mg by mouth daily.) 10 capsule 0   Esketamine HCl, 84 MG Dose, (SPRAVATO, 84 MG DOSE,) 28 MG/DEVICE SOPK  USE 3 SPRAYS IN EACH NOSTRIL EVERY OTHER WEEK 3 each 3   fexofenadine (ALLEGRA) 180 MG tablet Take 180 mg by mouth daily.     fluvoxaMINE (LUVOX) 100 MG tablet 1 tablet in the AM and 3 tablets in the evening 360 tablet 0   hydrocortisone (ANUSOL-HC) 2.5 % rectal cream Place rectally 2 (two) times daily. x 7-14 days 30 g 0   ketotifen (ZADITOR) 0.025 % ophthalmic solution Place 3 drops into both eyes at bedtime.     LORazepam (ATIVAN) 1 MG tablet TAKE 1-2 IN THE AM AND 1-2 TABLETS EVERY NIGHT AT BEDTIME AND 1 TABLET IN AFTERNOON when needed for anxiety and sleep 150 tablet 1   magnesium gluconate (MAGONATE) 500 MG tablet Take 500 mg by mouth daily.     MIBELAS 24 FE 1-20 MG-MCG(24) CHEW Chew 1 tablet by mouth at bedtime as needed (bowel regularity).     Multiple Vitamins-Minerals (ADULT GUMMY PO) Take 2 tablets by mouth in the morning.     nitrofurantoin (MACRODANTIN) 100 MG capsule Take 100 mg by mouth as needed (For urinary tract infection.).      oxyCODONE-acetaminophen (PERCOCET/ROXICET) 5-325 MG tablet Take 1-2 tablets by mouth every 6 (six) hours as needed for severe pain. 50 tablet 0   polyethylene glycol (MIRALAX / GLYCOLAX) packet Take 17 g by mouth daily as needed for mild constipation. 14 each 0   propranolol ER (INDERAL LA) 60 MG 24 hr capsule TAKE 1 CAPSULE BY MOUTH EVERY DAY 90 capsule 1   psyllium (METAMUCIL) 58.6 % powder Take 1 packet by mouth daily as needed (constipation).     SUMAtriptan (IMITREX) 100 MG tablet Take 1 tablet (100 mg total) by mouth every 2 (two) hours as needed for migraine. May repeat in 2 hours if headache persists or recurs. 10 tablet 1   temazepam (RESTORIL) 30 MG capsule TAKE 1 CAPSULE BY MOUTH AT BEDTIME AS NEEDED FOR SLEEP. 30 capsule 2   Vitamin D-Vitamin K (VITAMIN K2-VITAMIN D3 PO) Take 1-2 sprays by mouth daily.     No current facility-administered medications for this visit.    Medication Side Effects: None   Allergies:  Allergies  Allergen  Reactions   Hydrocodone Itching   Sulfamethoxazole-Trimethoprim Itching   Dust Mite Extract Other (See Comments)    Sneezing, watery eyes, runny nose   Latex Itching   Other Other (See Comments)    PT IS ALLERGIC TO CAT DANDER AND RAGWEED - Sneezing, watery eyes, runny nose    Pollen Extract Other (See Comments)    Sneezing, watery eyes, runny nose     Past Medical History:  Diagnosis Date   Abnormal Pap smear 2011   hpv/mild dysplasia,cin1   Anxiety    Cerebral palsy (HCC)    right arm/leg   Cystocele    Depression    Headache    Neuromuscular disorder (HCC)    Cerebral Palsy  OCD (obsessive compulsive disorder)    Osteoporosis    Uterine prolaps     Family History  Problem Relation Age of Onset   Cancer Father        skin AND LUNG   Alcohol abuse Sister        CRACK COCAINE    Social History   Socioeconomic History   Marital status: Married    Spouse name: Not on file   Number of children: Not on file   Years of education: Not on file   Highest education level: Not on file  Occupational History   Not on file  Tobacco Use   Smoking status: Never   Smokeless tobacco: Never  Substance and Sexual Activity   Alcohol use: Not Currently    Comment: OCCASIONAL beer   Drug use: No   Sexual activity: Yes    Birth control/protection: Pill    Comment: LOESTRIN 24 FE  Other Topics Concern   Not on file  Social History Narrative   Not on file   Social Determinants of Health   Financial Resource Strain: Not on file  Food Insecurity: Not on file  Transportation Needs: Not on file  Physical Activity: Not on file  Stress: Not on file  Social Connections: Not on file  Intimate Partner Violence: Not on file    Past Medical History, Surgical history, Social history, and Family history were reviewed and updated as appropriate.   Please see review of systems for further details on the patient's review from today.   Objective:   Physical Exam:  LMP  (LMP  Unknown)   Physical Exam Constitutional:      General: She is not in acute distress. Neurological:     Mental Status: She is alert and oriented to person, place, and time.     Cranial Nerves: No dysarthria.     Motor: Weakness present.     Gait: Gait abnormal.  Psychiatric:        Attention and Perception: Attention and perception normal.        Mood and Affect: Mood is anxious and depressed. Affect is not labile.        Speech: Speech normal.        Behavior: Behavior normal. Behavior is cooperative.        Thought Content: Thought content normal. Thought content is not delusional. Thought content does not include homicidal or suicidal ideation. Thought content does not include suicidal plan.        Cognition and Memory: Cognition and memory normal. Cognition is not impaired.        Judgment: Judgment normal.     Comments: Insight intact Ongoing OCD remains fairly severe but less anxious Checking compulsions up to 2 hours daily but improved noticeably Chronic depression persistent but better with Spravato More stressed with holidays      Lab Review:     Component Value Date/Time   NA 138 06/11/2021 0606   K 4.0 06/11/2021 0606   CL 107 06/11/2021 0606   CO2 26 06/11/2021 0606   GLUCOSE 90 06/11/2021 0606   BUN 18 06/11/2021 0606   CREATININE 0.81 06/11/2021 0606   CALCIUM 9.4 06/11/2021 0606   PROT 6.5 06/11/2021 0606   ALBUMIN 3.3 (L) 06/11/2021 0606   AST 17 06/11/2021 0606   ALT 14 06/11/2021 0606   ALKPHOS 141 (H) 06/11/2021 0606   BILITOT 0.2 (L) 06/11/2021 0606   GFRNONAA >60 06/11/2021 0606   GFRAA >60 07/09/2016 2458  Component Value Date/Time   WBC 5.8 06/11/2021 0606   RBC 4.12 06/11/2021 0606   HGB 12.5 06/11/2021 0606   HCT 39.7 06/11/2021 0606   PLT 299 06/11/2021 0606   MCV 96.4 06/11/2021 0606   MCH 30.3 06/11/2021 0606   MCHC 31.5 06/11/2021 0606   RDW 13.9 06/11/2021 0606   LYMPHSABS 1.9 06/11/2021 0606   MONOABS 0.5 06/11/2021 0606    EOSABS 0.1 06/11/2021 0606   BASOSABS 0.0 06/11/2021 0606    No results found for: "POCLITH", "LITHIUM"   No results found for: "PHENYTOIN", "PHENOBARB", "VALPROATE", "CBMZ"   .res Assessment: Plan:    Jerrie was seen today for follow-up, depression, anxiety and stress.  Diagnoses and all orders for this visit:  Recurrent major depression resistant to treatment [facility])  Mixed obsessional thoughts and acts  Social anxiety disorder  Insomnia due to mental condition  Migraine without aura and without status migrainosus, not intractable    Both Dx are TR and marked.  Impaired function but less so with Spravato re: depression..   She is receiving Spravato 84 mg weekly and marked improvement in the depression..  she feels it also helps OCD somewhat.  However still easily overwhelmed with low stress tolerance.  The OCD is improved with the increase in fluvoxamine and with Spravato.  Spends 2 hours daily and checking compulsions on her worst days but better when she travels.  She has been on higher doses of fluvoxamine above the usual max of 400 mg daily in the past.  This became difficult to obtain at 1 point and the dose was reduced to 300 mg daily.   Disc SE. She would like to continue Luvox 400 mg daily again    She is tolerating the meds well  Continue  Luvox back to 400 mg nightly as of January 2023. Disc dosing higher than usual.  She feels this is increase has helped more with OCD which remains chronically severe.  Rash better off Auvelity Per her request continue Wellbutrin XL 450 mg every morning. She has come to the realization that the rash she had previously attributed to [facility] was not related.  She is interested in perhaps retrying Auvelity perhaps after the holidays. There are few alternative medication options that remain.  Disc Spravato DT TRD incl details and SE. Disc dosing and duration.  Pt with severe depression MADRS 36 on 06/29/21  Patient was administered  Spravato 84 mg intranasally dosage today.  The patient experienced the typical dissociation which gradually resolved over the 2-hour period of observation.  There were no complications.  Specifically the patient did not have nausea or vomiting or headache.  Blood pressures remained within normal ranges at the 40-minute and 2-hour follow-up intervals.  By the time the 2-hour observation period was met the patient was alert and oriented and able to exit without assistance. She tends to have lingering sedative effects but not severe. .  See nursing note for further details. Per protocol will continue Spravato to 84 mg weekly.  She has been okay since cutting back to once weekly.  We discussed the short-term risks associated with benzodiazepines including sedation and increased fall risk among others.  Discussed long-term side effect risk including dependence, potential withdrawal symptoms, and the potential eventual dose-related risk of dementia.  But recent studies from 2020 dispute this association between benzodiazepines and dementia risk. Newer studies in 2020 do not support an association with dementia. Disc this is high dose and not ideal.  Also  disc risk combining it with temazepam. Rec gradually reduce HS lorazepam to 1 mg Hs.  Can continue lorazepam 2 mg AM and 1 mg in afternoon bc of chronic anxiety and it is helpful and tolerated. She can continue temazepam 30 mg nightly.  She tends to have a lot of anxious negative thoughts at night when she is trying to go to bed.  She is trying to reduce the dose.  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  Complaining of HA and history migraine.  Asks for increase imitrex and disc preventatives like propranolol ER Ok increase imitrex to 100 mg prn migraine and start propranolol ER for migraine prevention.  Supportive therapy dealing with some of the recent stressors including family stressors and her own health related to cerebral  palsy   No other med changes today  Follow-up every other week   Lynder Parents, MD, DFAPA  Please see After Visit Summary for patient specific instructions.  Future Appointments  Date Time Provider [place]  10/26/2022 11:00 AM Blanchie Serve, PhD CP-CP None  11/08/2022 11:00 AM Blanchie Serve, PhD CP-CP None  11/21/2022 11:00 AM Blanchie Serve, PhD CP-CP None  12/20/2022 11:00 AM Blanchie Serve, PhD CP-CP None    No orders of the defined types were placed in this encounter.    -------------------------------

## 2022-10-21 ENCOUNTER — Other Ambulatory Visit: Payer: Self-pay | Admitting: Psychiatry

## 2022-10-21 DIAGNOSIS — F339 Major depressive disorder, recurrent, unspecified: Secondary | ICD-10-CM

## 2022-10-24 ENCOUNTER — Other Ambulatory Visit: Payer: Self-pay

## 2022-10-24 DIAGNOSIS — G43009 Migraine without aura, not intractable, without status migrainosus: Secondary | ICD-10-CM

## 2022-10-24 MED ORDER — SUMATRIPTAN SUCCINATE 100 MG PO TABS: 100.0000 mg | ORAL_TABLET | ORAL | 1 refills | Status: DC | PRN

## 2022-10-26 ENCOUNTER — Ambulatory Visit: Payer: 59 | Admitting: Psychiatry

## 2022-10-27 ENCOUNTER — Ambulatory Visit (INDEPENDENT_AMBULATORY_CARE_PROVIDER_SITE_OTHER): Payer: 59 | Admitting: Psychiatry

## 2022-10-27 DIAGNOSIS — Z636 Dependent relative needing care at home: Secondary | ICD-10-CM | POA: Diagnosis not present

## 2022-10-27 DIAGNOSIS — Z638 Other specified problems related to primary support group: Secondary | ICD-10-CM

## 2022-10-27 DIAGNOSIS — F339 Major depressive disorder, recurrent, unspecified: Secondary | ICD-10-CM | POA: Diagnosis not present

## 2022-10-27 DIAGNOSIS — F422 Mixed obsessional thoughts and acts: Secondary | ICD-10-CM

## 2022-10-27 DIAGNOSIS — F4010 Social phobia, unspecified: Secondary | ICD-10-CM

## 2022-10-27 NOTE — Progress Notes (Signed)
Psychotherapy Progress Note [facility] [name], PhD LP  Patient ID: Casey Diaz (Breta "Casey Diaz")    MRN: [id number] Therapy format: Individual psychotherapy Date: 10/27/2022      Start: 11:20a     Stop: 12:05p     Time Spent: 45 min Location: In-person   Session narrative (presenting needs, interim history, self-report of stressors and symptoms, applications of prior therapy, status changes, and interventions made in session) Eventful week, with Casey Diaz evicted from [employer] apartment and a restraining order against him.  Best she knows, he is still working at [employer], and in his own time consuming expensive liquor and lunchmeat.  Escalating pattern of becoming belligerent when confronted to pay his away at mom's house, using foul language toward his sisters and lobbing counter-accusations to avoid confrontation about taking advantage of mother.  Did become physically pushy with [name], mom tried to use her cane to stop him, turned into a police domestic call, then Monday's eviction.  Ed got involved, on a teacher workday, took [place] to the [facility], where he was belligerent and drunk, and to [name]'s intense dismay got kicked out instead of committed.  Ed put him up at a hotel, where word is he is behaving badly there.  In her irritation, Casey Diaz has put in calls to [facility] to provide lengthy information to his psychiatrist, Dr. Doyne Keel, and demanding to speak with management Levell July).  As we speak, Casey Diaz is at a regularly scheduled outpatient visit with Dr. Doyne Keel, where she hopes he will be honest and reveal happenings this week, his substance abuse etc.  Some relief in not having to hear the next story of strife in mother's home, but further worried for her mother, who worries after Casey Diaz, and worried about backlash from [facility], who has reportedly been adamant about her getting uninvolved with her family's needs, so she just doesn't talk about it now.    Evaluated her thinking and  educated her about the system and how it would work concerning getting her brother more assertive, effective help.  Unrealistic to hope his psychiatrist will commit him, today or any day, without florid, risky behavior in evidence with her.  Unrealistic to hope he will share with her what transpired this week, and unrealistic to expect that the hospital -- if he goes -- or his outpatient psychiatrist will look up 55 year old hospital records from the [facility] days.  Unrealistic to believe that [facility] or another state hospital will take him in for 30 days to see through establishing medication, even though it does sound like he needs a more comprehensive rehabilitation approach.  Confronted that, if she and family believe he needs involuntary treatment to begin recovering, it will take one or more of them swearing out a petition themselves at the magistrate's office, and after that it depends on a second psychiatric eval for the hospital to hold him, and that just for 72 hrs before it takes further legal validation.  Much more likely that he could be set up with an ACTT team, if outpatient treatment gets the idea, and if the patient is willing.  Otherwise, it may just have to be the toughlove approach, setting limits on what and how he can get time and service from his family.  Upcoming hearing for the restraining order, at which time they do hope to ask for a mental health evaluation, and potentially court-ordered treatment.  Agreed he needs to be on mood stabilizer, no question, but absent criteria  for commitment the big problem is motivation.  Valid concern his social judgment is bad enough that he will get himself hurt by people he associates with at [facility].    Meanwhile, [name] may come with her next time (1/23).  Professed agenda to help him understand how she can still need to be concerned for [place], involved with [name].  Further wish that he and [name] would try church with her, though this is also pretty  unrealistic after > 10 years uninvolved and resistant, and the fact that she can really only address one agenda item with a tense spouse who is liable to be avoidant of therapy.    Therapeutic modalities: Cognitive Behavioral Therapy, Solution-Oriented/Positive Psychology, and Ego-Supportive  Mental Status/Observations:  Appearance:   Casual     Behavior:  Appropriate  Motor:  Normal and exc CP  Speech/Language:   Clear and Coherent  Affect:  Appropriate  Mood:  dysthymic  Thought process:  normal  Thought content:    WNL and overvalued ideas  Sensory/Perceptual disturbances:    WNL  Orientation:  Fully oriented  Attention:  Good    Concentration:  Good  Memory:  WNL  Insight:    Fair  Judgment:   Good  Impulse Control:  Good   Risk Assessment: Danger to Self: No Self-injurious Behavior: No Danger to Others: No Physical Aggression / Violence: No Duty to Warn: No Access to Firearms a concern: No  Assessment of progress:  progressing, despite intensified circumstances  Diagnosis:   ICD-10-CM   1. 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 Caregiver stress  Z63.6     3. Mixed obsessional thoughts and acts  F42.2     4. Relationship problem with family members  Z63.8     5. Social anxiety disorder  F40.10      Plan:  Focal Self-affirm leading and promoting toughlove approach to Paul's intransigent behavior and need for strong treatment Temper expectations and approach with Paul's treatment -- do not lead with customer complaints when you're not the customer, don't expect hearsay to be enough to commit him, do prepare to swear out a petition if he shows a frank danger to self or others, do stick to behavioral limits, and do keep offering to connect him with what family believe will help Plan for conjoint tx next time Ongoing Family assistance, risk of perceived codependency -- Self-affirm that wanting to help is not dysfunctional, all calls are  judgment calls.  Option Al-Anon for support and boundary help.  Endorse connecting Moro to further help, either through [facility] or [facility], and recruiting family members into necessary confrontation. Relationship with H -- Open to join tx.  Consider addressing H's overinterpretation of "choosing" FOO over FOI/him and perceived negativity toward [place]. Consider further willingness to challenge sexual habits on grounds of feeling left out and/or objectified, and ask for H to confer re sexual expectations rather than simply accede to perceived demands.  Overall, take care to approach one issue at a time. Parenting -- Continue appropriate efforts to shape [name]'s socializing and responsibility to clean up after himself, e.g., "after you ___, then you can ___".  Re. perceived loss of close relationship, self-affirm that she always wanted to create a "buddy" but any adolescent boy still distances and may go through a sullen, isolative period.  Other options for therapy for him. Depressive thinking -- Generally, look for thought patterns of shaming self irrationally and collecting troubles to the  point of desperation and dispute.  Both are distress-making and interfere with interpersonal effectiveness. Intrusive thoughts and checking compulsions -- Seems to be lower priority at this stage, but when present, seek to press on without checking corners and spaces for imaginary abused children. Self-care -- Continue efforts to engage exercise, part-time work, and supportive relationship outside the home.  Continue to grow in reasonably representing her physical limitations and needs without self-shaming. Other recommendations/advice as may be noted above Continue to utilize previously learned skills ad lib Maintain medication as prescribed and work faithfully with relevant prescriber(s) if any changes are desired or seem indicated Call the clinic on-call service, 988/hotline, 911, or present to [facility] or ER if any  life-threatening psychiatric crisis Return for as already scheduled. Already scheduled visit in this office 10/31/2022.  Blanchie Serve, PhD Luan Moore, PhD LP Clinical Psychologist, [facility] [address] [place], [place] [address] [phone]

## 2022-10-28 NOTE — Progress Notes (Signed)
NURSE Visit:   Pt arrived for her weekly Spravato Treatment, she started Spravato treatments on 10/07/2021, she continues with 84 mg (3 of the 28 mg) Spravato nasal spray once a week treatments, which is the maintenance dose for her treatment resistant depression. This is treatment #41.   Last treatment was 08/15/2022. She was directed to the treatment room to get vitals taken first. Initial vital signs are B/P at 3:15 PM 122/90, 87, Pt instructed to blow her nose and to recline back at 45 degrees. Pt given first nasal spray (28 mg) administered by pt observed by nurse. Pt always has to go to the restroom after her first dose, at that point she is not too medicated to walk to bathroom safely.There were 5 minutes between each dose, total of 84 mg. Tolerated well. Pt's medication is delivered by [facility] in [place] and stored inside a safe behind a locked door as well. Spravato is a CIII medication and has to be only given at a treatment facility and observed by nurse as pt administered intranasally.  Pt's 40 minute vital signs at 4:10 PM 122/80, 84. Dr. Clovis Pu met with pt to discuss her care at the end of her treatment when her thoughts are clearer and they discussed her medication and moods. She does go to the bathroom at least once during her treatment and again towards the end when she is clearer and able to walk. No sedation and had slight feeling of being "high" she reports. Discharge vitals at 5:30 PM 108/91, 74. Pt stable for discharge.  Pt was observed on site a total of 120 minutes per FDA/REMS requirements. Pt was with nurse for clinical assessment 50 minutes. Pt will call back to schedule next treatment.   [id number]   EXP AUG 2025

## 2022-10-31 ENCOUNTER — Ambulatory Visit (INDEPENDENT_AMBULATORY_CARE_PROVIDER_SITE_OTHER): Payer: 59 | Admitting: Psychiatry

## 2022-10-31 ENCOUNTER — Ambulatory Visit: Payer: 59

## 2022-10-31 VITALS — BP 119/80 | HR 97

## 2022-10-31 DIAGNOSIS — F4010 Social phobia, unspecified: Secondary | ICD-10-CM | POA: Diagnosis not present

## 2022-10-31 DIAGNOSIS — F339 Major depressive disorder, recurrent, unspecified: Secondary | ICD-10-CM

## 2022-10-31 DIAGNOSIS — F422 Mixed obsessional thoughts and acts: Secondary | ICD-10-CM | POA: Diagnosis not present

## 2022-10-31 DIAGNOSIS — F5105 Insomnia due to other mental disorder: Secondary | ICD-10-CM

## 2022-10-31 DIAGNOSIS — G43009 Migraine without aura, not intractable, without status migrainosus: Secondary | ICD-10-CM | POA: Diagnosis not present

## 2022-11-01 MED ORDER — SUMATRIPTAN SUCCINATE 100 MG PO TABS: 100.0000 mg | ORAL_TABLET | ORAL | 1 refills | Status: DC | PRN

## 2022-11-01 NOTE — Progress Notes (Signed)
NURSE Visit:   Pt arrived for her weekly Spravato Treatment, she started Spravato treatments on 10/07/2021, she continues with 84 mg (3 of the 28 mg) Spravato nasal spray once a week treatments, which is the maintenance dose for her treatment resistant depression. This is treatment #44.    She was directed to the treatment room to get vitals taken first. Initial vital signs are B/P at 3:15 PM 124/88, 97, Pt instructed to blow her nose and to recline back at 45 degrees. Pt given first nasal spray (28 mg) administered by pt observed by nurse. Pt always has to go to the restroom after her first dose, at that point she is not too medicated to walk to bathroom safely.There were 5 minutes between each dose, total of 84 mg. Tolerated well. Pt's medication is delivered by [facility] in [place] and stored inside a safe behind a locked door as well. Spravato is a CIII medication and has to be only given at a treatment facility and observed by nurse as pt administered intranasally.  Pt was very upset today due to her family having issues with her brother, they have to go to court this Wednesday due to him not being able to be in contact with their mother. He was also evicted out of apartment. Pt feels torn on her emotions of trying to help him but knowing he won't take his medication or get help. Assessed pt's 40 minute vital signs at 4:10 PM 128/83, 92. Dr. Clovis Pu met with pt to discuss her care at the end of her treatment when her thoughts are clearer and they discussed her medication and moods. She does go to the bathroom at least once during her treatment and again towards the end when she is clearer and able to walk. No sedation and had slight feeling of being "high" she reports. Discharge vitals at 5:30 PM 119/80, 97. Pt stable for discharge.  Pt was observed on site a total of 120 minutes per FDA/REMS requirements. Pt was with nurse for clinical assessment 50 minutes. Pt next visit is on January 29th,  Monday.    [id number]   EXP [id number]

## 2022-11-03 ENCOUNTER — Other Ambulatory Visit: Payer: Self-pay | Admitting: Psychiatry

## 2022-11-03 DIAGNOSIS — F5105 Insomnia due to other mental disorder: Secondary | ICD-10-CM

## 2022-11-03 NOTE — Telephone Encounter (Signed)
Casey Diaz called and needs a refill on her Temazepam 30 mg  [id number] [place], [place] - [place]   Phone: [phone]  Fax: [phone]

## 2022-11-08 ENCOUNTER — Ambulatory Visit: Payer: 59 | Admitting: Psychiatry

## 2022-11-15 ENCOUNTER — Encounter: Payer: Self-pay | Admitting: Psychiatry

## 2022-11-15 NOTE — Progress Notes (Signed)
Casey Diaz [id number] 09-Jan-1966 57 y.o.    Subjective:   Patient ID:  Casey Diaz is a 57 y.o. (DOB Dec 07, 1965) female.  Chief Complaint:  Chief Complaint  Patient presents with   Follow-up   Depression   Anxiety   Fatigue     HPI Casey Diaz presents to the office today for follow-up of OCD and severe anxiety.     December 2019 visit the following was noted: No meds were changed. Lives in [place] and back for followup.  Sx are about the same.  Has to take meds with different sizes. Pt reports that mood is Anxious and Depressed and describes anxiety as Severe. Anxiety symptoms include: Excessive Worry, Obsessive Compulsive Symptoms:   Checking,,. Pt reports has interrupted sleep and nocturia. Pt reports that appetite is good. Pt reports that energy is no change and down slightly. Concentration is down slightly. Suicidal thoughts:  denied by patient. Loves the environment of [place] but misses some things there.  She's not able to work there.  H works there and likes it.  Struggled with not working, feels isolated and not up to task of meeting people.  Does attend a church and met a friend who's been helpful.  Leaving for [place] on 10/16/18.   04/09/2020 appointment the following is noted:  Staying another year in [place] bc Covid and other things. Last few months a lot of crying spells.  Is in menopause. Wonders about med changes though is nervous about it.  Crying spells associated with depressing thoughts more than stress or OCD.   Covid really hard on everyone and couldn't see family for 18 mos.  Family still very dysfunctional. No close friends in part due to OCD and depression. Son high Autism spectrum with ADHD and anxiety and she's with him all the time. Greater health problems with CP so more pains.   05/15/20 appt with the following noted: Casey Diaz for menopause and helps some. Still depressed.  Chronically. In [place] for 2 more weeks then to [place] for  another year. A lot of stressors lately triggering more checking and anxiety.   OCD is her CC now and seems.  Got worse DT stress.   Stressed with Asberger's son and her health.  H works a lot.  Her FOO still stress. Plan: Trintellix 10 mg 1 tablet in the morning with food and reduce fluvoxamine to 5 tablets nightly for 1 week  then reduce it to 4 tablets nightly.   07/02/20 appt with the following noted: Decided not to get Trintellix bc difficulty getting it. It is available.  There.  Wants to start it now.   Both depression and OCD are severe.  Not suicidal in intent or plan. Did not take samples with her to [place] but will be back in December. covid is worse there and travel is difficult.  Wants to reduce Wellbutrin DT dry mouth. Plan: She's afraid to reduce Luvox at this time DT fear of worsening OCD.  But will consider. Trintellix 10 mg 1 tablet in the morning with food and reduce fluvoxamine to 5 tablets nightly for 1 week  then reduce it to 4 tablets nightly. Also reduce Wellbutrin XL to 300 mg daily.    9-13 2022 appointment with the following noted: Back in [place] since July 14.  Broke arm a month ago and surgery.  It's all been rough adjustment.   B has cancer on his face and M fell taking him to the doctor.  Misses the water and weather of [place].   Cry a lot more since menopause. Still depression and anxiety and OCD.  Asks about ketamine. On Wellbutrin 300, Luvox 300.  No Trintellix. Added Ativan 2 mg AM and HS and it helps.  More likely to get upset at night. Plan: Increase Luvox back to 400 mg daily.  She thinks she's worse on less. Continue Wellbutrin XL to 300 mg daily. 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  She tolerated it well without unusual nausea or vomiting headache or other somatic symptoms.  She did have the expected dissociation which gradually resolved over  the course of the 2-hour period of observation.  She was a little concerned about her balance given her cerebral palsy but has not noted unusual or unexpected problems.  She is motivated to can continue Spravato in hopes of reducing her depressive symptoms. 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  She also has treatment resistant OCD which is partially managed with medications but is still quite disabling.  She is tolerating the medications well.  She is sleeping adequately.  Her appetite is adequate.  She is not having suicidal thoughts.  She continues to wish for a better treatment for OCD that would give her some relief.  09/30/2021 appointment with the following noted: She received her first dose of Spravato 84 mg intranasally today.  She tolerated it well without unusual nausea, vomiting, or other somatic symptoms.  Dissociation as expected did occur and gradually resolved over the 2-hour period of observation.  She did have a mild headache today with the treatment and received ibuprofen 600 mg at her request.  We will follow this to see if it is a pattern Patient is still depressed.  She said she was late with her medicine today and today was a particularly depressing day.  However she notes that the Spravato has lifted her mood considerably even today.  She is hopeful that it will continue to be helpful.  No suicidal thoughts.  She has ongoing chronic anxiety and OCD at baseline.  10/04/21 appt noted: Patient received Spravato 84 mg for the second time today.  She tolerated it well without any unusual headache, nausea or vomiting or other somatic symptoms.  Dissociation did occur and she gradually [place] resolution over the 2-hour period of observation. She did not have any unusual problems after she left the office last Spravato administration.  She did not have any specific problems with balance or walking.  She is at increased risk of that difficulty because of  cerebral palsy.  So far she has not noticed much mood effect from the medication beyond the first day of receiving it.  However she would like to continue Spravato in hopes of getting the antidepressant effect that is desired. Stress dealing with mother's behavior at party pt hosted.  Guilt over it.  10/07/2021 appointment noted: Patient received Spravato 84 mg for the second time today.  She tolerated it well without any unusual headache, nausea or vomiting or other somatic symptoms.  Dissociation did occur and she gradually [place] resolution over the 2-hour period of observation. She still is not sure about the antidepressant effect of Spravato.  Events over the holidays and demands, make it difficult to assess.  She still notes that the OCD tends to worsen the depression and vice versa.  She tolerates the  Spravato well and wants to continue the trial.  10/15/2021 appointment with the following noted: Patient received Spravato 84 mg for the second time today.  She tolerated it well without any unusual headache, nausea or vomiting or other somatic symptoms.  Dissociation did occur and she gradually [place] resolution over the 2-hour period of observation. Patient says it was somewhat difficult to evaluate the effect of the Spravato.  It was scheduled to be twice weekly for 4 weeks consecutively but the holidays have interfered with that administration.  She asked what specifically should be she should be looking for in order to assess improvement.  That was discussed.  The OCD is unchanged and the depression so far is not significantly different.  She still tolerates meds.  There have been no recent med changes  10/19/2021 appt noted: Patient received Spravato 84 mg for the second today.  She tolerated it well without any unusual headache, nausea or vomiting or other somatic symptoms.  Dissociation did occur and she gradually saw resolution over the 2-hour period of observation.   10/21/2021 appointment  noted: Patient received Spravato 84 mg today.  She tolerated it well without any unusual headache, nausea or vomiting or other somatic symptoms.  Dissociation did occur and she gradually saw resolution over the 2-hour period of observation.  She feels better than last week.  She is not as depressed and down.  She is still dealing with grief around the death of her cousin that was unexpected.  It is still difficult to tell how much the Spravato was doing but she is hopeful.  Anxiety is still present with the OCD.  She is not having suicidal thoughts.  She is not hopeless.  She wants to continue treatment.  10/25/2021 appointment with the following noted: Patient received Spravato 84 mg today.  She tolerated it well without any unusual headache, nausea or vomiting or other somatic symptoms.  Dissociation did occur and she gradually saw resolution over the 2-hour period of observation.  She does not typically find the dissociation very strong. She is beginning to think the Spravato is helping somewhat with the depression.  It has been difficult to tell with the holidays intervening as well as the death of her cousin.  She has not been able to get Spravato twice weekly for 4 weeks straight as typically planned.  However she is hopeful.  The OCD remains significant.  She still has a tendency to think very negatively.  She is not suicidal.  10/28/2021 appointment with the following noted: Patient received Spravato 84 mg today.  She tolerated it well without any unusual headache, nausea or vomiting or other somatic symptoms.  Dissociation did occur and she gradually saw resolution over the 2-hour period of observation.  She does not typically find the dissociation very strong. She is feeling more hopeful about the administration of Spravato.  She is having less depression she believes.  Still not dramatically different.  She still has a tendency to have a lot of anxiety and rumination and OCD.  She is not suicidal.   She is eager to continue the Spravato.  11/01/2021 appointment with the following noted: Patient received Spravato 84 mg today.  She tolerated it well without any unusual headache, nausea or vomiting or other somatic symptoms.  Dissociation did occur and she gradually saw resolution over the 2-hour period of observation.  She does not typically find the dissociation very strong. She is continuing to see a little bit of improvement in depression  with Spravato.  The anxiety remains but may be not as severe.  The OCD remains markedly severe chronically.  She is not suicidal.  She is encouraged by the degree of improvement with Spravato and inability to enjoy things more and not be quite as ruminative.  11/04/2021 appt noted: Patient received Spravato 84 mg today.  She tolerated it well without any unusual headache, nausea or vomiting or other somatic symptoms.  Dissociation did occur and she gradually saw resolution over the 2-hour period of observation.  She does not typically find the dissociation very strong. No SE complaints with meds. She continues to feel hopeful about the Spravato.  She has less depression.  Because of a number of factors she is uncertain of the full benefit but thinks she is somewhat less depressed.  Her anxiety and OCD remain significant but a little better.  She is tolerating the medications and does not desire medicine change.  She is not currently complaining of insomnia.   11/08/2021 appointment the following noted: Patient received Spravato 84 mg today.  She tolerated it well without any unusual headache, nausea or vomiting or other somatic symptoms.  Dissociation did occur and she gradually saw resolution over the 2-hour period of observation.  She does not typically find the dissociation very strong. No SE complaints with meds. She feels the Spravato is helping somewhat.  She would like to see a greater effect.  However she is able to enjoy things.  She is productive at home.   She would like to see a lifting of a degree of sadness that remains.  The anxiety and OCD remained largely unchanged.  She wondered about the dosing of Wellbutrin 300 mg a day and Luvox 300 mg a day and possible increases.  She has been at higher doses in the past.  She plans to start water therapy for her weakness and for her shoulder.  11/11/2021 appointment with the following noted: Patient received Spravato 84 mg today.  She tolerated it well without any unusual headache, nausea or vomiting or other somatic symptoms.  Dissociation did occur and she gradually saw resolution over the 2-hour period of observation.  She does not typically find the dissociation very strong. No SE complaints with meds. She feels the Spravato is clearly helping the depression.  She would like to see a more significant effect.  She is still having trouble thinking positive. Her energy is fair.  Concentration is good except for the problem with chronic obsessions. She has been taking Wellbutrin 300 mg in Luvox 300 mg for quite some time but has taken higher doses in the past.  We discussed that.  She would like to try higher doses in order to get a better effect if possible. We just increased the doses a couple of days ago.  No effect yet.  11/15/2021 appointment with the following noted: Patient received Spravato 84 mg today.  She tolerated it well without any unusual headache, nausea or vomiting or other somatic symptoms.  Dissociation did occur and she gradually saw resolution over the 2-hour period of observation.  She does not typically find the dissociation very strong. No SE complaints with meds. The patient is now convinced that the Spravato is helping the depression.  She would like to continue twice weekly Spravato this week if possible.  She has tolerated the increase in Wellbutrin to 450 mg daily and the increase and fluvoxamine to 400 mg daily without complications thus far.  The OCD and anxiety feed  the  depression to some extent. She spends approximately 2 hours daily with checking compulsions due to obsessions about causing harm to others.  For example fearing that when she has hit a pot hole that she may have hit a person and going back to check.  Checking corners and rooms out of fear that she may have harmed someone.  Other various checking compulsions.  She is hoping the increase in fluvoxamine to 400 mg will reduce that over the weeks to come.  She is not seeing a significant difference with the addition of the Spravato though she understands that was not expected.  She is more productive at home and more motivated and able to enjoy things more fully as a result of the Spravato treatment.  She is tolerating the medication  11/18/2021 appointment with the following noted: Patient received Spravato 84 mg today.  She tolerated it well without any unusual headache, nausea or vomiting or other somatic symptoms.  Dissociation did occur and she gradually saw resolution over the 2-hour period of observation.  She does not typically find the dissociation very strong. No SE complaints with meds. She clearly believes the Spravato has been helpful for the depression.  She wonders whether to continue to treatments weekly or to cut back to 1 weekly.  She would like to continue twice weekly in hopes of getting additional improvement in the depression because it is not resolved but it is difficult to get here twice a week in terms of arranging rides. She is recently increased Wellbutrin XL to 450 mg daily and fluvoxamine to 400 mg daily but they have not had time to have an official effect.  She is tolerating that well.  She is tolerating meds overwork overall well. The OCD remains the same as noted on 11/15/2021  11/25/21 appt noted: Patient received Spravato 84 mg today.  She tolerated it well without any unusual headache, nausea or vomiting or other somatic symptoms.  Dissociation did occur and she gradually saw  resolution over the 2-hour period of observation.  She does not typically find the dissociation very strong. No SE complaints with meds. She thinks the increase in Wellbutrin and Luvox have been potentially helpful for depression and OCD respectively.  It has been too early to see the full effect.  She is sleeping and eating well.  She is functioning at home.  She still spends a lot of time that is about 2 hours a day dealing with compulsive behaviors.  12/02/21 appt noted: Patient received Spravato 84 mg today.  She tolerated it well without any unusual headache, nausea or vomiting or other somatic symptoms.  Dissociation did occur and she gradually saw resolution over the 2-hour period of observation.  She does not typically find the dissociation very strong. No SE complaints with meds. Several losses and stressors recently that affect her sense of mood. However still sees significant benefit from the Spravato for her depression.  Wants to continue it. Suspect early  some benefit from the increased Wellbutrin for depression and Luvox for OCD. Tolerating meds. No complaints about the meds. Sleeping and eating well.  No new health concerns.  12/09/21 appt noted: Patient received Spravato 84 mg today.  She tolerated it well without any unusual headache, nausea or vomiting or other somatic symptoms.  Dissociation did occur and she gradually saw resolution over the 2-hour period of observation.  She does not typically find the dissociation very strong. No SE complaints with meds. Seeing noticeable improvement from  increase fluvoxamine to 400 mg daily.  Tolerating meds without concerns over them. Depression is stable with residual sx of easy guilt and easily stressed.  OCD contributes to depression but depression is not severe with less crying spells.  Productive at home with chores.  Enjoyed recent birthday.  Sleeping good. No new concerns.  12/23/2021 appointment noted: Patient received Spravato 84 mg  today.  She tolerated it well without any unusual headache, nausea or vomiting or other somatic symptoms.  Dissociation did occur and she gradually saw resolution over the 2-hour period of observation.  She does not typically find the dissociation very strong. No SE complaints with meds. Seeing noticeable improvement from increase fluvoxamine to 400 mg daily.  Tolerating meds without concerns over them. Her depression is somewhat improved with the Spravato.  She also feels generally a little lighter.  She is more motivated.  She is less overwhelmed by guilt.  The OCD is gradually improving but is still quite time-consuming as noted before.  She is sleeping well.  No side effects  12/30/2021 appointment with the following noted: Patient received Spravato 84 mg today.  She tolerated it well without any unusual headache, nausea or vomiting or other somatic symptoms.  Dissociation did occur and she gradually saw resolution over the 2-hour period of observation.  She does not typically find the dissociation very strong. No SE complaints with meds. Seeing noticeable improvement from increase fluvoxamine to 400 mg daily.  Tolerating meds without concerns over them. She is confident of her the improvement seen with Spravato.  She is less hopeless.  Guilt is marked remarkably improved.  She is not having any thoughts of death or dying.  She is more motivated for activities such as exercise which she is recently started.  She is sleeping well. The OCD remains severe but it is improving somewhat with the increase in fluvoxamine.  It is still consuming a couple hours per day.  01/10/22 apravato 84 admin  01/24/22 appt noted: Patient received Spravato 84 mg today.  She tolerated it well without any unusual headache, nausea or vomiting or other somatic symptoms.  Dissociation did occur and she gradually saw resolution over the 2-hour period of observation.  She does not typically find the dissociation very strong. No  SE complaints with meds. Very tearful today.  Feels like she has been suppressing emotion in the Spravato caused it to be released.  Discussed some stressors.  Overall still feels the medicine is helpful.  She has missed some of the scheduled Spravato treatments that were intended to be weekly due to circumstances beyond her control.  She is still struggling with OCD as previously noted but does believe the medications are helpful. Plan no med changes  01/31/2022 received Spravato 84 mg today  02/09/2022 appointment with the following noted: Patient received Spravato 84 mg today.  She tolerated it well without any unusual headache, nausea or vomiting or other somatic symptoms.  Dissociation did occur and she gradually saw resolution over the 2-hour period of observation.  She does not typically find the dissociation very strong. No SE complaints with meds. Spravato clearly helps depression and OCD but easily gets overwhelmed and tearful with fairly routine stressors.  Tolerating meds. Sleep and appetite is OK Asks to increase lorazepam to 2 mg AM and HS and 67m afternoon  02/16/22 appt noted: Patient received Spravato 84 mg today.  She tolerated it well without any unusual headache, nausea or vomiting or other somatic symptoms.  Dissociation did  occur and she gradually saw resolution over the 2-hour period of observation.  She does not typically find the dissociation very strong. No SE complaints with meds. She has chronic depesssion and OCD but is improved with Spravato, both dx versus before.  She has continued Luvox 400 mg and Wellbutrin 450 mg and is tolerating it.  Chronically easily stressed.  Tolerating all meds.  Doesn't like taking more meds.  Spending a couple hours daily with OCD.  No SI No med changes.  02/21/22 appt noted:   Doesn't like taking more meds.  Spending a couple hours daily with OCD.  No SI No med changes.  02/21/22 appt noted: Patient received Spravato 84 mg today.  She  tolerated it well without any unusual headache, nausea or vomiting or other somatic symptoms.  Dissociation did occur and she gradually saw resolution over the 2-hour period of observation.  She does not typically find the dissociation very strong. No SE complaints with meds. She has chronic depesssion and OCD but is improved with Spravato, both dx versus before.  She has continued Luvox 400 mg and Wellbutrin 450 mg and is tolerating it.  Chronically easily overwhelmed and doesn't know why.  Tolerating all meds. Wants to continue meds.  03/16/22 appt noted: Patient received Spravato 84 mg today.  She tolerated it well without any unusual headache, nausea or vomiting or other somatic symptoms.  Dissociation did occur and she gradually saw resolution over the 2-hour period of observation.  She does not typically find the dissociation very strong. No SE complaints with meds. Overall she still feels the Spravato has been helpful not only for her depression but also for her OCD which was somewhat unexpected.  OCD is still significant but it is less severe than prior to starting Spravato.  She is tolerating Luvox 400 mg and Wellbutrin 450 mg.  We discussed possible med adjustments.  03/23/22 appt noted: Patient received Spravato 84 mg today.  She tolerated it well without any unusual headache, nausea or vomiting or other somatic symptoms.  Dissociation did occur and she gradually saw resolution over the 2-hour period of observation.  She does not typically find the dissociation very strong. No SE complaints with meds. She is still depressed and still has OCD of course but is improved with the Spravato.  She is tolerating the medications well.  We had previously discussed the possibility of switching some of the Wellbutrin to [name] and she is very interested in that in hopes of further improvement in depression and OCD.  She understands that Auvelity is not used for OCD on the label.  She is tolerating the  medications.  She is still easily overwhelmed.  She is sleeping and eating okay.. Plan: Reduce Wellbutrin XL to 300 mg AM and add Auvelity 1 tablet each AM  03/30/22 appt noted: Patient received Spravato 84 mg today.  She tolerated it well without any unusual headache, nausea or vomiting or other somatic symptoms.  Dissociation did occur and she gradually saw resolution over the 2-hour period of observation.  She does not typically find the dissociation very strong. No SE complaints with meds. She is still depressed and still has OCD of course but is improved with the Spravato.  She is tolerating the medications well.  No difference with Auvelity 1 AM so far and no SE.  Going on vacation on Saturday. 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 Sleep and appetite good. Plan: Increase Auvelity to 1 twice daily and reduce Wellbutrin  to XL 150 every morning  04/14/2022 appointment with the following noted: Patient received Spravato 84 mg today.  She tolerated it well without any unusual headache, nausea or vomiting or other somatic symptoms.  Dissociation did occur and she gradually saw resolution over the 2-hour period of observation.  She does not typically find the dissociation very strong. No SE complaints with meds. She is still depressed and still has OCD of course but is improved with the Spravato.  She is tolerating the medications well.  Just increased Auvelity to BID yesterday and reduced Wellbutrin to 150 AM. No SE so far.  No change in mood or anxiety so far.  Chronic OCD as noted and residual depression and chronic fatigue.  04/21/2022 appointment with the following noted: Patient received Spravato 84 mg today.  She tolerated it well without any unusual headache, nausea or vomiting or other somatic symptoms.  Dissociation did occur and she gradually saw resolution over the 2-hour period of observation.  She does not typically find the dissociation very strong. No SE complaints with  meds. She is still depressed and still has OCD of course but is improved with the Spravato.   She has questions about the dosing of lorazepam. She tends to have negative anxious thoughts at night.  This tends to interfere with her ability to go to sleep.  She is getting about 8 to 9 hours of sleep.  She is tolerating the meds without excessive sedation and does not nap during the day.  05/06/22 appt noted: Patient received Spravato 84 mg today.  She tolerated it well without any unusual headache, nausea or vomiting or other somatic symptoms.  Dissociation did occur and she gradually saw resolution over the 2-hour period of observation.  She does not typically find the dissociation very strong. No SE complaints with meds. She is still depressed and still has OCD of course but is improved with the Spravato.   Had some questions about timing of dosing of fluvoxamine and Auvelity. OCD is not quite as time consuming.  Sleep and eating are the same.   No SE meds.  05/25/22 appt noted: Patient received Spravato 84 mg today.  She tolerated it well without any unusual headache, nausea or vomiting or other somatic symptoms.  Dissociation did occur and she gradually saw resolution over the 2-hour period of observation.  She does not typically find the dissociation very strong. No SE complaints with meds. She is still depressed and still has OCD of course but is improved with the Spravato.   She has less OCD when away from home and on vacation of note. Plan: Rec gradually reduce HS lorazepam to 1 mg Hs.  Can continue lorazepam 2 mg AM and 1 mg in afternoon bc of chronic anxiety and it is helpful and tolerated. She can continue temazepam 30 mg nightly.  She tends to have a lot of anxious negative thoughts at night when she is trying to go to bed  06/16/22 appt noted: Patient received Spravato 84 mg today.  She tolerated it well without any unusual headache, nausea or vomiting or other somatic symptoms.   Dissociation did occur and she gradually saw resolution over the 2-hour period of observation.  She does not typically find the dissociation very strong. No SE complaints with meds. She is still depressed and still has OCD of course but is improved with the Spravato.   She has less OCD when away from home and on vacation of note. She is tolerating  the medications.  She has continued current medications. 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  She still receives benefit from [place].  Her depression and OCD are better with the Spravato.  She thinks she is getting additional benefit with the switch from Wellbutrin to [place].  07/04/2022 appointment noted: Reports she developed a rash on her face from [facility] and feels like she is allergic to it.  She stopped it and went back to Wellbutrin 450 mg every morning.  The rash has cleared up.  She did not require any medical attention and did not have shortness of breath. Overall her depression and OCD are about the same as they have been.  She did not notice a substantial difference from the brief treatment with Auvelity but she understands she did not take a full course.  She is tolerating the current medicines well. Current meds fluvoxamine 400 mg daily, Wellbutrin XL 450 mg daily, lorazepam 1 to 2 mg in the morning and 1 to 2 mg at night and 1 mg in the afternoon, temazepam 30 mg nightly. She wants to continue the Spravato because she feels it has been helpful for both her depression and her racing OCD  07/18/22 appt noted: Patient received Spravato 84 mg today.  She tolerated it well without any unusual headache, nausea or vomiting or other somatic symptoms.   Dissociation did occur and she gradually saw resolution over the 2-hour period of observation.  She does not typically find the dissociation very strong. No SE complaints with meds. She is still depressed and still has OCD of course but is improved with the Spravato.  Rash better off Auvelity and back on Welllbutrin XL 450 mg AM, fluvoxamine 400 mg daily.  08/15/22 appt noted: Current psych meds: Wellbutrin XL 450 mg AM, fluvoxamine 100 mg in AM and 300 mg HS, lorazepam 1 mg 1-2 mg in the AM and HS and 1 tablet prn midday for anxiety, temazepam 30 mg HS Patient received Spravato 84 mg today.  She tolerated it well without any unusual headache, nausea or vomiting or other somatic symptoms.  Dissociation did occur and she gradually saw resolution over the 2-hour period of observation.  She does not typically find the dissociation very strong. No SE complaints with meds. She has a great deal of stress dealing with her family.  Disc brother's ongoing mania and difficulty getting him help and the stress he causes for the family. She wants to continue Spravato through this very stressful holdicay season and reevaluate the frequency after the New Year.  09/12/22 appt noted: Current psych meds: Wellbutrin XL 450 mg AM, fluvoxamine 100 mg in AM and 300 mg HS, lorazepam 1 mg 1-2 mg in the AM and HS and 1 tablet prn midday for anxiety, temazepam 30 mg HS Patient received Spravato 84 mg today.  She tolerated it well without any unusual headache, nausea or vomiting or other somatic symptoms.  Dissociation did occur and she gradually saw resolution over the 2-hour period of observation.  She does not typically find the dissociation very strong. No SE complaints with meds. She has a great deal of stress dealing with her family.  Disc brother's  ongoing mania and difficulty getting him help and the stress he causes for the family. She wants to continue Spravato through this very stressful holdicay season and  reevaluate the frequency after the New Year.  Chronically easily overwhelmed with family. Complaining of HA and history migraine.  Asks for increase imitrex and disc preventatives like propranolol ER  09/26/22 appt noted: Current psych meds: Wellbutrin XL 450 mg AM, fluvoxamine 100 mg in AM and 300 mg HS, lorazepam 1 mg 1-2 mg in the AM and HS and 1 tablet prn midday for anxiety, temazepam 30 mg HS Patient received Spravato 84 mg today.  She tolerated it well without any unusual headache, nausea or vomiting or other somatic symptoms.  Dissociation did occur and she gradually saw resolution over the 2-hour period of observation.  She does not typically find the dissociation very strong. No SE complaints with meds. She has a great deal of stress dealing with her family.  This is ongoing The holidays are much more stressful DT family problems.  She is noting OCD is much worse over the last couple of week.  Depression is better with Spravato. Needed higher dose meds for migraine.   10/03/22 appt noted: Current psych meds: Wellbutrin XL 450 mg AM, fluvoxamine 100 mg in AM and 300 mg HS, lorazepam 1 mg 1-2 mg in the AM and HS and 1 tablet prn midday for anxiety, temazepam 30 mg HS Patient received Spravato 84 mg today.  She tolerated it well without any unusual headache, nausea or vomiting or other somatic symptoms.  Dissociation did occur and she gradually saw resolution over the 2-hour period of observation.  She does not typically find the dissociation very strong. No SE complaints with meds. She has now realized that the rash she previously previously attributed to [facility] was not related.  She is interested may be retrying that after the holidays.  She is tolerating medications otherwise. The holidays remain chronically stressful to her due to family dynamic problems which cause her to consistently feel stuck.  Under more stress her OCD is worse.  She will have a tendency to have crying spells.  The  depression and OCD are still improved with Spravato as compared to before.  11/15/22 appt noted: Current psych meds: Wellbutrin XL 450 mg AM, fluvoxamine 100 mg in AM and 300 mg HS, lorazepam 1 mg 1-2 mg in the AM and HS and 1 tablet prn midday for anxiety, temazepam 30 mg HS Patient received Spravato 84 mg today.  She tolerated it well without any unusual headache, nausea or vomiting or other somatic symptoms.  Dissociation did occur and she gradually saw resolution over the 2-hour period of observation.  She does not typically find the dissociation very strong. No SE complaints with meds. Continues to feel depressed and overwhelmed by family problems including her brother's mania and recent eviction and commitment.  Chronic OCD worse when stressed.  No SI.  Tolerating meds.  Previous psych med trials include Prozac, paroxetine, sertraline, fluvoxamine, venlafaxine, Anafranil with no response,  Wellbutrin, , Viibryd, Trintellix 10 1 month NR Auvelity BID NR Geodon,  risperidone, Rexulti, Abilify,  Seroquel, Latuda 40 mg with irritability.  lamotrigine lithium,  BuSpar, Namenda,  pramipexole with no response, and Topamax, pindolol  ECT-MADRS    Flowsheet Row Office Visit from 06/29/2021 in [facility]  MADRS Total Score 36      Flowsheet Row Admission (Discharged) from 06/11/2021 in [facility]  C-SSRS RISK  CATEGORY No Risk        Review of Systems:  Review of Systems  Constitutional:  Positive for fatigue.  Cardiovascular:  Negative for palpitations.  Musculoskeletal:  Positive for arthralgias, back pain, gait problem and neck pain. Negative for joint swelling.  Neurological:  Positive for weakness and headaches. Negative for tremors.  Psychiatric/Behavioral:  Positive for dysphoric mood. Negative for suicidal ideas. The patient is nervous/anxious.     Medications: I have reviewed the patient's current medications.  Current  Outpatient Medications  Medication Sig Dispense Refill   Abaloparatide (TYMLOS) 3120 MCG/1.56ML SOPN Inject into the skin.     Azelastine-Fluticasone 137-50 MCG/ACT SUSP Place 1-2 sprays into both nostrils daily.     baclofen (LIORESAL) 10 MG tablet Take 20 mg by mouth at bedtime as needed for muscle spasms.     buPROPion (WELLBUTRIN XL) 150 MG 24 hr tablet TAKE 3 TABLETS BY MOUTH DAILY 270 tablet 1   dicyclomine (BENTYL) 10 MG capsule Take 10 mg by mouth daily.     docusate sodium (COLACE) 100 MG capsule Take 1 capsule (100 mg total) by mouth 2 (two) times daily. (Patient taking differently: Take 100 mg by mouth daily.) 10 capsule 0   Esketamine HCl, 84 MG Dose, (SPRAVATO, 84 MG DOSE,) 28 MG/DEVICE SOPK USE 3 SPRAYS IN EACH NOSTRIL EVERY OTHER WEEK 3 each 3   fexofenadine (ALLEGRA) 180 MG tablet Take 180 mg by mouth daily.     fluvoxaMINE (LUVOX) 100 MG tablet 1 tablet in the AM and 3 tablets in the evening 360 tablet 0   hydrocortisone (ANUSOL-HC) 2.5 % rectal cream Place rectally 2 (two) times daily. x 7-14 days 30 g 0   ketotifen (ZADITOR) 0.025 % ophthalmic solution Place 3 drops into both eyes at bedtime.     LORazepam (ATIVAN) 1 MG tablet TAKE 1-2 IN THE AM AND 1-2 TABLETS EVERY NIGHT AT BEDTIME AND 1 TABLET IN AFTERNOON when needed for anxiety and sleep 150 tablet 1   magnesium gluconate (MAGONATE) 500 MG tablet Take 500 mg by mouth daily.     MIBELAS 24 FE 1-20 MG-MCG(24) CHEW Chew 1 tablet by mouth at bedtime as needed (bowel regularity).     Multiple Vitamins-Minerals (ADULT GUMMY PO) Take 2 tablets by mouth in the morning.     nitrofurantoin (MACRODANTIN) 100 MG capsule Take 100 mg by mouth as needed (For urinary tract infection.).      oxyCODONE-acetaminophen (PERCOCET/ROXICET) 5-325 MG tablet Take 1-2 tablets by mouth every 6 (six) hours as needed for severe pain. 50 tablet 0   polyethylene glycol (MIRALAX / GLYCOLAX) packet Take 17 g by mouth daily as needed for mild constipation.  14 each 0   propranolol ER (INDERAL LA) 60 MG 24 hr capsule TAKE 1 CAPSULE BY MOUTH EVERY DAY 90 capsule 1   psyllium (METAMUCIL) 58.6 % powder Take 1 packet by mouth daily as needed (constipation).     SUMAtriptan (IMITREX) 100 MG tablet Take 1 tablet (100 mg total) by mouth every 2 (two) hours as needed for migraine. May repeat in 2 hours if headache persists or recurs. 10 tablet 1   temazepam (RESTORIL) 30 MG capsule TAKE 1 CAPSULE BY MOUTH AT BEDTIME AS NEEDED FOR SLEEP 30 capsule 2   Vitamin D-Vitamin K (VITAMIN K2-VITAMIN D3 PO) Take 1-2 sprays by mouth daily.     No current facility-administered medications for this visit.    Medication Side Effects: None   Allergies:  Allergies  Allergen  Reactions   Hydrocodone Itching   Sulfamethoxazole-Trimethoprim Itching   Dust Mite Extract Other (See Comments)    Sneezing, watery eyes, runny nose   Latex Itching   Other Other (See Comments)    PT IS ALLERGIC TO CAT DANDER AND RAGWEED - Sneezing, watery eyes, runny nose    Pollen Extract Other (See Comments)    Sneezing, watery eyes, runny nose     Past Medical History:  Diagnosis Date   Abnormal Pap smear 2011   hpv/mild dysplasia,cin1   Anxiety    Cerebral palsy (HCC)    right arm/leg   Cystocele    Depression    Headache    Neuromuscular disorder (HCC)    Cerebral Palsy   OCD (obsessive compulsive disorder)    Osteoporosis    Uterine prolaps     Family History  Problem Relation Age of Onset   Cancer Father        skin AND LUNG   Alcohol abuse Sister        CRACK COCAINE    Social History   Socioeconomic History   Marital status: Married    Spouse name: Not on file   Number of children: Not on file   Years of education: Not on file   Highest education level: Not on file  Occupational History   Not on file  Tobacco Use   Smoking status: Never   Smokeless tobacco: Never  Substance and Sexual Activity   Alcohol use: Not Currently    Comment: OCCASIONAL  beer   Drug use: No   Sexual activity: Yes    Birth control/protection: Pill    Comment: LOESTRIN 24 FE  Other Topics Concern   Not on file  Social History Narrative   Not on file   Social Determinants of Health   Financial Resource Strain: Not on file  Food Insecurity: Not on file  Transportation Needs: Not on file  Physical Activity: Not on file  Stress: Not on file  Social Connections: Not on file  Intimate Partner Violence: Not on file    Past Medical History, Surgical history, Social history, and Family history were reviewed and updated as appropriate.   Please see review of systems for further details on the patient's review from today.   Objective:   Physical Exam:  LMP  (LMP Unknown)   Physical Exam Constitutional:      General: She is not in acute distress. Neurological:     Mental Status: She is alert and oriented to person, place, and time.     Cranial Nerves: No dysarthria.     Motor: Weakness present.     Gait: Gait abnormal.  Psychiatric:        Attention and Perception: Attention and perception normal.        Mood and Affect: Mood is anxious and depressed. Affect is not labile.        Speech: Speech normal.        Behavior: Behavior normal. Behavior is cooperative.        Thought Content: Thought content normal. Thought content is not delusional. Thought content does not include homicidal or suicidal ideation. Thought content does not include suicidal plan.        Cognition and Memory: Cognition and memory normal. Cognition is not impaired.        Judgment: Judgment normal.     Comments: Insight intact Ongoing OCD remains fairly severe but less anxious Checking compulsions up to 2 hours daily but  improved noticeably Chronic depression persistent but better with Spravato More stressed with chronic family problems recently worse.      Lab Review:     Component Value Date/Time   NA 138 06/11/2021 0606   K 4.0 06/11/2021 0606   CL 107 06/11/2021  0606   CO2 26 06/11/2021 0606   GLUCOSE 90 06/11/2021 0606   BUN 18 06/11/2021 0606   CREATININE 0.81 06/11/2021 0606   CALCIUM 9.4 06/11/2021 0606   PROT 6.5 06/11/2021 0606   ALBUMIN 3.3 (L) 06/11/2021 0606   AST 17 06/11/2021 0606   ALT 14 06/11/2021 0606   ALKPHOS 141 (H) 06/11/2021 0606   BILITOT 0.2 (L) 06/11/2021 0606   GFRNONAA >60 06/11/2021 0606   GFRAA >60 07/09/2016 0438       Component Value Date/Time   WBC 5.8 06/11/2021 0606   RBC 4.12 06/11/2021 0606   HGB 12.5 06/11/2021 0606   HCT 39.7 06/11/2021 0606   PLT 299 06/11/2021 0606   MCV 96.4 06/11/2021 0606   MCH 30.3 06/11/2021 0606   MCHC 31.5 06/11/2021 0606   RDW 13.9 06/11/2021 0606   LYMPHSABS 1.9 06/11/2021 0606   MONOABS 0.5 06/11/2021 0606   EOSABS 0.1 06/11/2021 0606   BASOSABS 0.0 06/11/2021 0606    No results found for: "POCLITH", "LITHIUM"   No results found for: "PHENYTOIN", "PHENOBARB", "VALPROATE", "CBMZ"   .res Assessment: Plan:    Casey Diaz was seen today for follow-up, depression, anxiety and fatigue.  Diagnoses and all orders for this visit:  Recurrent major depression resistant to treatment [facility])  Mixed obsessional thoughts and acts  Migraine without aura and without status migrainosus, not intractable -     SUMAtriptan (IMITREX) 100 MG tablet; Take 1 tablet (100 mg total) by mouth every 2 (two) hours as needed for migraine. May repeat in 2 hours if headache persists or recurs.  Social anxiety disorder  Insomnia due to mental condition    Both Dx are TR and marked.  Impaired function but less so with Spravato re: depression..   She is receiving Spravato 84 mg weekly and marked improvement in the depression..  she feels it also helps OCD somewhat.  However still easily overwhelmed with low stress tolerance.  The OCD is improved with the increase in fluvoxamine and with Spravato.  Spends 2 hours daily and checking compulsions on her worst days but better when she travels.  She  has been on higher doses of fluvoxamine above the usual max of 400 mg daily in the past.    Disc SE. She would like to continue Luvox 400 mg daily again    She is tolerating the meds well  Continue  Luvox back to 400 mg nightly as of January 2023. Disc dosing higher than usual.  She feels this is increase has helped more with OCD which remains chronically severe.  Per her request continue Wellbutrin XL 450 mg every morning. She has come to the realization that the rash she had previously attributed to [facility] was not related.  She is interested in perhaps retrying Auvelity. There are few alternative medication options that remain.  Disc Spravato DT TRD incl details and SE. Disc dosing and duration.  Pt with severe depression MADRS 36 on 06/29/21  Patient was administered Spravato 84 mg intranasally dosage today.  The patient experienced the typical dissociation which gradually resolved over the 2-hour period of observation.  There were no complications.  Specifically the patient did not have nausea or vomiting  or headache.  Blood pressures remained within normal ranges at the 40-minute and 2-hour follow-up intervals.  By the time the 2-hour observation period was met the patient was alert and oriented and able to exit without assistance. She tends to have lingering sedative effects but not severe. .  See nursing note for further details. Per protocol will continue Spravato to 84 mg weekly.  She has been okay since cutting back to once weekly.  We discussed the short-term risks associated with benzodiazepines including sedation and increased fall risk among others.  Discussed long-term side effect risk including dependence, potential withdrawal symptoms, and the potential eventual dose-related risk of dementia.  But recent studies from 2020 dispute this association between benzodiazepines and dementia risk. Newer studies in 2020 do not support an association with dementia. Disc this is high dose  and not ideal.  Also disc risk combining it with temazepam. Rec gradually reduce HS lorazepam to 1 mg Hs.  Can continue lorazepam 2 mg AM and 1 mg in afternoon bc of chronic anxiety and it is helpful and tolerated. She can continue temazepam 30 mg nightly.  She tends to have a lot of anxious negative thoughts at night when she is trying to go to bed.  She is trying to reduce the dose.  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  Complaining of HA and history migraine.  Asks for increase imitrex and disc preventatives like propranolol ER Ok increase imitrex to 100 mg prn migraine and start propranolol ER for migraine prevention.  Supportive therapy dealing with some of the recent stressors including fbrother's mania and conflict.  No other med changes today  Follow-up every other week   Lynder Parents, MD, DFAPA  Please see After Visit Summary for patient specific instructions.  Future Appointments  Date Time Provider [place]  11/21/2022 11:00 AM Blanchie Serve, PhD CP-CP None  11/28/2022  3:00 PM CP-NURSE CP-[facility] None  12/20/2022 11:00 AM Mitchum, Robert, PhD CP-CP None    No orders of the defined types were placed in this encounter.    -------------------------------

## 2022-11-21 ENCOUNTER — Other Ambulatory Visit: Payer: Self-pay | Admitting: Psychiatry

## 2022-11-21 ENCOUNTER — Ambulatory Visit (INDEPENDENT_AMBULATORY_CARE_PROVIDER_SITE_OTHER): Payer: 59 | Admitting: Psychiatry

## 2022-11-21 DIAGNOSIS — Z638 Other specified problems related to primary support group: Secondary | ICD-10-CM

## 2022-11-21 DIAGNOSIS — G809 Cerebral palsy, unspecified: Secondary | ICD-10-CM

## 2022-11-21 DIAGNOSIS — F422 Mixed obsessional thoughts and acts: Secondary | ICD-10-CM

## 2022-11-21 DIAGNOSIS — F339 Major depressive disorder, recurrent, unspecified: Secondary | ICD-10-CM

## 2022-11-21 DIAGNOSIS — Z636 Dependent relative needing care at home: Secondary | ICD-10-CM

## 2022-11-21 DIAGNOSIS — F4010 Social phobia, unspecified: Secondary | ICD-10-CM | POA: Diagnosis not present

## 2022-11-21 DIAGNOSIS — F66 Other sexual disorders: Secondary | ICD-10-CM

## 2022-11-21 NOTE — Telephone Encounter (Signed)
Continue Luvox back to 400 mg nightly as of January 2023.   RF is for 300 mg - what dose is she on ?
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
## 2022-11-23 ENCOUNTER — Other Ambulatory Visit: Payer: Self-pay | Admitting: Adult Health

## 2022-11-23 NOTE — Telephone Encounter (Signed)
Spravato refill 

## 2022-11-25 NOTE — Telephone Encounter (Signed)
Hold no refills needed at this time

## 2022-11-28 ENCOUNTER — Encounter: Payer: 59 | Admitting: Psychiatry

## 2022-11-29 ENCOUNTER — Ambulatory Visit (INDEPENDENT_AMBULATORY_CARE_PROVIDER_SITE_OTHER): Payer: 59 | Admitting: Adult Health

## 2022-11-29 ENCOUNTER — Ambulatory Visit: Payer: 59

## 2022-11-29 ENCOUNTER — Encounter: Payer: Self-pay | Admitting: Adult Health

## 2022-11-29 VITALS — BP 118/88 | HR 73

## 2022-11-29 DIAGNOSIS — F339 Major depressive disorder, recurrent, unspecified: Secondary | ICD-10-CM

## 2022-11-29 NOTE — Progress Notes (Signed)
Casey Diaz [id number] Jul 30, 1966 57 y.o.  Subjective:   Patient ID:  Casey Diaz is a 57 y.o. (DOB August 11, 1966) female.  Chief Complaint: No chief complaint on file.   HPI Benelli E Grosch presents to the office today for follow-up of treatment resistant depression (TRD).   Spravato treatment    Patient was administered Spravato 84 mg intranasally today. Patient was observed by provider throughout [facility] treatment. The patient experienced the typical dissociation which gradually resolved over the 2-hour period of observation. There were no complications. Specifically, the patient did not have any untoward side effects - feeling disconnected from themself, their thoughts, feelings and things around them, dizziness, nausea, feeling sleepy, decreased feeling of sensitivity (numbness) spinning sensation, feeling anxious, lack of energy, increased blood pressure, feeling happy or very excited, or headache. Blood pressures remained within normal ranges at the 40-minute and 2-hour follow-up intervals. By the time the 2-hour observation period was met the patient was alert and oriented and able to exit without assistance. Patient willing to continue Spravato administration for the treatment of resistant depression. See nursing note for further details.    ECT-MADRS    [place] Office Visit from 06/29/2021 in [place] Total Score 36      Flowsheet Row Admission (Discharged) from 06/11/2021 in [place] No Risk        Review of Systems:  Review of Systems  Musculoskeletal:  Negative for gait problem.  Neurological:  Negative for tremors.  Psychiatric/Behavioral:         Please refer to HPI    Medications: I have reviewed the patient's current medications.  Current Outpatient Medications  Medication Sig Dispense Refill   Abaloparatide (TYMLOS) 3120 MCG/1.56ML SOPN Inject into the skin.      Azelastine-Fluticasone 137-50 MCG/ACT SUSP Place 1-2 sprays into both nostrils daily.     baclofen (LIORESAL) 10 MG tablet Take 20 mg by mouth at bedtime as needed for muscle spasms.     buPROPion (WELLBUTRIN XL) 150 MG 24 hr tablet TAKE 3 TABLETS BY MOUTH DAILY 270 tablet 1   dicyclomine (BENTYL) 10 MG capsule Take 10 mg by mouth daily.     docusate sodium (COLACE) 100 MG capsule Take 1 capsule (100 mg total) by mouth 2 (two) times daily. (Patient taking differently: Take 100 mg by mouth daily.) 10 capsule 0   Esketamine HCl, 84 MG Dose, (SPRAVATO, 84 MG DOSE,) 28 MG/DEVICE SOPK USE 3 SPRAYS IN EACH NOSTRIL EVERY OTHER WEEK 3 each 3   fexofenadine (ALLEGRA) 180 MG tablet Take 180 mg by mouth daily.     fluvoxaMINE (LUVOX) 100 MG tablet 1 tablet in the AM and 3 tablets at night 360 tablet 0   hydrocortisone (ANUSOL-HC) 2.5 % rectal cream Place rectally 2 (two) times daily. x 7-14 days 30 g 0   ketotifen (ZADITOR) 0.025 % ophthalmic solution Place 3 drops into both eyes at bedtime.     LORazepam (ATIVAN) 1 MG tablet TAKE 1-2 IN THE AM AND 1-2 TABLETS EVERY NIGHT AT BEDTIME AND 1 TABLET IN AFTERNOON when needed for anxiety and sleep 150 tablet 1   magnesium gluconate (MAGONATE) 500 MG tablet Take 500 mg by mouth daily.     MIBELAS 24 FE 1-20 MG-MCG(24) CHEW Chew 1 tablet by mouth at bedtime as needed (bowel regularity).     Multiple Vitamins-Minerals (ADULT GUMMY PO) Take 2 tablets by mouth in the morning.  nitrofurantoin (MACRODANTIN) 100 MG capsule Take 100 mg by mouth as needed (For urinary tract infection.).      oxyCODONE-acetaminophen (PERCOCET/ROXICET) 5-325 MG tablet Take 1-2 tablets by mouth every 6 (six) hours as needed for severe pain. 50 tablet 0   polyethylene glycol (MIRALAX / GLYCOLAX) packet Take 17 g by mouth daily as needed for mild constipation. 14 each 0   propranolol ER (INDERAL LA) 60 MG 24 hr capsule TAKE 1 CAPSULE BY MOUTH EVERY DAY 90 capsule 1   psyllium (METAMUCIL) 58.6  % powder Take 1 packet by mouth daily as needed (constipation).     SUMAtriptan (IMITREX) 100 MG tablet Take 1 tablet (100 mg total) by mouth every 2 (two) hours as needed for migraine. May repeat in 2 hours if headache persists or recurs. 10 tablet 1   temazepam (RESTORIL) 30 MG capsule TAKE 1 CAPSULE BY MOUTH AT BEDTIME AS NEEDED FOR SLEEP 30 capsule 2   Vitamin D-Vitamin K (VITAMIN K2-VITAMIN D3 PO) Take 1-2 sprays by mouth daily.     No current facility-administered medications for this visit.    Medication Side Effects: None  Allergies:  Allergies  Allergen Reactions   Hydrocodone Itching   Sulfamethoxazole-Trimethoprim Itching   Dust Mite Extract Other (See Comments)    Sneezing, watery eyes, runny nose   Latex Itching   Other Other (See Comments)    PT IS ALLERGIC TO CAT DANDER AND RAGWEED - Sneezing, watery eyes, runny nose    Pollen Extract Other (See Comments)    Sneezing, watery eyes, runny nose     Past Medical History:  Diagnosis Date   Abnormal Pap smear 2011   hpv/mild dysplasia,cin1   Anxiety    Cerebral palsy (HCC)    right arm/leg   Cystocele    Depression    Headache    Neuromuscular disorder (HCC)    Cerebral Palsy   OCD (obsessive compulsive disorder)    Osteoporosis    Uterine prolaps     Past Medical History, Surgical history, Social history, and Family history were reviewed and updated as appropriate.   Please see review of systems for further details on the patient's review from today.   Objective:   Physical Exam:  LMP  (LMP Unknown)   Physical Exam Constitutional:      General: She is not in acute distress. Musculoskeletal:        General: No deformity.  Neurological:     Mental Status: She is alert and oriented to person, place, and time.     Coordination: Coordination normal.  Psychiatric:        Attention and Perception: Attention and perception normal. She does not perceive auditory or visual hallucinations.        Mood and  Affect: Mood normal. Mood is not anxious or depressed. Affect is not labile, blunt, angry or inappropriate.        Speech: Speech normal.        Behavior: Behavior normal.        Thought Content: Thought content normal. Thought content is not paranoid or delusional. Thought content does not include homicidal or suicidal ideation. Thought content does not include homicidal or suicidal plan.        Cognition and Memory: Cognition and memory normal.        Judgment: Judgment normal.     Comments: Insight intact     Lab Review:     Component Value Date/Time   NA 138 06/11/2021 0606  K 4.0 06/11/2021 0606   CL 107 06/11/2021 0606   CO2 26 06/11/2021 0606   GLUCOSE 90 06/11/2021 0606   BUN 18 06/11/2021 0606   CREATININE 0.81 06/11/2021 0606   CALCIUM 9.4 06/11/2021 0606   PROT 6.5 06/11/2021 0606   ALBUMIN 3.3 (L) 06/11/2021 0606   AST 17 06/11/2021 0606   ALT 14 06/11/2021 0606   ALKPHOS 141 (H) 06/11/2021 0606   BILITOT 0.2 (L) 06/11/2021 0606   GFRNONAA >60 06/11/2021 0606   GFRAA >60 07/09/2016 0438       Component Value Date/Time   WBC 5.8 06/11/2021 0606   RBC 4.12 06/11/2021 0606   HGB 12.5 06/11/2021 0606   HCT 39.7 06/11/2021 0606   PLT 299 06/11/2021 0606   MCV 96.4 06/11/2021 0606   MCH 30.3 06/11/2021 0606   MCHC 31.5 06/11/2021 0606   RDW 13.9 06/11/2021 0606   LYMPHSABS 1.9 06/11/2021 0606   MONOABS 0.5 06/11/2021 0606   EOSABS 0.1 06/11/2021 0606   BASOSABS 0.0 06/11/2021 0606    No results found for: "POCLITH", "LITHIUM"   No results found for: "PHENYTOIN", "PHENOBARB", "VALPROATE", "CBMZ"   .res Assessment: Plan:    Recommendations/Plan   Continue Spravato treatment.   Patient advised to contact office with any questions, adverse effects, or acute worsening in signs and symptoms.  Diagnoses and all orders for this visit:  Recurrent major depression resistant to treatment [facility])     Please see After Visit Summary for patient specific  instructions.  Future Appointments  Date Time Provider [place]  12/12/2022  2:00 PM Blanchie Serve, PhD CP-CP None  12/20/2022 11:00 AM Blanchie Serve, PhD CP-CP None  01/18/2023 11:00 AM Blanchie Serve, PhD CP-CP None  02/01/2023 11:00 AM Blanchie Serve, PhD CP-CP None  02/22/2023 11:00 AM Blanchie Serve, PhD CP-CP None  03/08/2023 11:00 AM Blanchie Serve, PhD CP-CP None    No orders of the defined types were placed in this encounter.   -------------------------------

## 2022-12-01 ENCOUNTER — Other Ambulatory Visit: Payer: Self-pay | Admitting: Psychiatry

## 2022-12-01 DIAGNOSIS — F339 Major depressive disorder, recurrent, unspecified: Secondary | ICD-10-CM

## 2022-12-01 NOTE — Telephone Encounter (Signed)
Bupropion needs PA

## 2022-12-04 NOTE — Progress Notes (Deleted)
   Established Patient Office Visit  Subjective   Patient ID: Casey Diaz, female    DOB: 1965/12/27  Age: 57 y.o. MRN: [id number]  No chief complaint on file.   HPI  {History (Optional):23778}  ROS    Objective:     BP (!) 88/74   Pulse 91   LMP  (LMP Unknown)   SpO2 98%  {Vitals History (Optional):23777}  Physical Exam   No results found for any visits on 10/03/22.  {Labs (Optional):23779}  The ASCVD Risk score (Arnett DK, et al., 2019) failed to calculate for the following reasons:   Cannot find a previous HDL lab   Cannot find a previous total cholesterol lab    Assessment & Plan:   Problem List Items Addressed This Visit   None   No follow-ups on file.    Elmore Guise, CMA

## 2022-12-06 NOTE — Telephone Encounter (Signed)
Please send [facility]

## 2022-12-06 NOTE — Telephone Encounter (Signed)
I think she is on Auvelity actually so shouldn't be taking all of that. Will send to CC to confirm then go from there.

## 2022-12-06 NOTE — Progress Notes (Signed)
NURSE Visit:   Pt arrived for her weekly Spravato Treatment, she started Spravato treatments on 10/07/2021, she continues with 84 mg (3 of the 28 mg) Spravato nasal spray once a week treatments, which is the maintenance dose for her treatment resistant depression. This is treatment #45.    She was directed to the treatment room to get vitals taken first. Initial vital signs are B/P at 3:05 PM 124/87, 84, Pt instructed to blow her nose and to recline back at 45 degrees. Pt given first nasal spray (28 mg) administered by pt observed by nurse. Pt always has to go to the restroom after her first dose, at that point she is not too medicated to walk to bathroom safely.There were 5 minutes between each dose, total of 84 mg. Tolerated well. Pt's medication is delivered by [facility] in [place] and stored inside a safe behind a locked door as well. Spravato is a CIII medication and has to be only given at a treatment facility and observed by nurse as pt administered intranasally.  Assessed pt's 40 minute vital signs at 3:15 PM 120/72, 69. Deloria Lair, DNP met with pt to discuss her care at the end of her treatment when her thoughts are clearer and they discussed her medication and moods. She does go to the bathroom at least once during her treatment and again towards the end when she is clearer and able to walk. No sedation and had slight feeling of being "high" she reports. Discharge vitals at 5:00 PM 118/88, 73. Pt stable for discharge.  Pt was observed on site a total of 120 minutes per FDA/REMS requirements. Pt was with nurse for clinical assessment 50 minutes. Pt will call back to schedule.    [id number]   EXP [id number]

## 2022-12-06 NOTE — Telephone Encounter (Signed)
I am not sure whether she is taking Auvelity or taking Wellbutrin.  I gave her the option to take Auvelity with 1 Wellbutrin XL 150 mg every morning.  But she cannot take Wellbutrin XL 450 mg with Auvelity.  Please call and check to see what she is currently taking so we know how to send in the prescription.

## 2022-12-08 ENCOUNTER — Telehealth: Payer: Self-pay

## 2022-12-08 NOTE — Telephone Encounter (Signed)
Did she mention an apt for Spravato? We can discuss at that next treatment if it's before she runs out of samples

## 2022-12-08 NOTE — Telephone Encounter (Signed)
LVM to [facility] to verify what she is taking.

## 2022-12-08 NOTE — Telephone Encounter (Signed)
She will call and let us know about next treatment. She is having a dental procedure done. We didn't have any samples on our side. She asked to save her some.

## 2022-12-12 ENCOUNTER — Ambulatory Visit (INDEPENDENT_AMBULATORY_CARE_PROVIDER_SITE_OTHER): Payer: 59 | Admitting: Psychiatry

## 2022-12-12 DIAGNOSIS — F422 Mixed obsessional thoughts and acts: Secondary | ICD-10-CM | POA: Diagnosis not present

## 2022-12-12 DIAGNOSIS — F4010 Social phobia, unspecified: Secondary | ICD-10-CM | POA: Diagnosis not present

## 2022-12-12 DIAGNOSIS — F339 Major depressive disorder, recurrent, unspecified: Secondary | ICD-10-CM

## 2022-12-12 DIAGNOSIS — G809 Cerebral palsy, unspecified: Secondary | ICD-10-CM

## 2022-12-12 DIAGNOSIS — Z636 Dependent relative needing care at home: Secondary | ICD-10-CM | POA: Diagnosis not present

## 2022-12-12 DIAGNOSIS — Z638 Other specified problems related to primary support group: Secondary | ICD-10-CM

## 2022-12-12 NOTE — Progress Notes (Signed)
Psychotherapy Progress Note [facility] Luan Moore, PhD LP  Patient ID: Casey Diaz (Casey "Eustaquio Maize")    MRN: [id number] Therapy format: Individual psychotherapy Date: 12/12/2022      Start: 2:25p     Stop: 3:05p     Time Spent: 40 min Location: In-person   Session narrative (presenting needs, interim history, self-report of stressors and symptoms, applications of prior therapy, status changes, and interventions made in session) [place] delayed by overrun earlier patient.  Resumed Spravato last week, off and on availability with other health care appts.  Chiropractor following this.  Gum graft tomorrow, and dreading it.  Rx'd hydrocodone, which she's allergic to.  Suggested she take it up with her oral surgeon to be sure provider is aware and to ask about alternatives, like Tramadol.  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  On one level, glad to be back into the world a bit, on another apprehensive about being accepted and doing something meaningful.  Duties are flexible right now, which lends uncertainty, and already starting to feel suspicious that 2 coworkers whisper behind her back.  Encouraged have more faith and be ready to ask what she can do next to be of help.  Tearfully acknowledges that Casey Diaz was right that work helps her, feels she should have taken that advice years ago.  Support/empathy provided, and reminded that she was trying to navigate dilemmas, like marriage, mothering, the extra needs Casey Diaz turned out to be, and serious orthopedic injuries, not to mention being drawn into multiple, chronic family dysfunctions.  Encouraged to be kind as possible with the past, authentic and dedicated with the present, and let the future sort itself out  Also discussed struggles with two introverted, tight-lipped men in her family.  Support/empathy provided. Affirmed and encouraged.  Therapeutic modalities: Cognitive Behavioral Therapy and  Solution-Oriented/Positive Psychology  Mental Status/Observations:  Appearance:   Casual     Behavior:  Appropriate  Motor:  Normal exc CP  Speech/Language:   Clear and Coherent  Affect:  Appropriate  Mood:  dysthymic  Thought process:  normal  Thought content:    Rumination  Sensory/Perceptual disturbances:    WNL  Orientation:  Fully oriented  Attention:  Good    Concentration:  Good  Memory:  WNL  Insight:    Good  Judgment:   Good  Impulse Control:  Fair   Risk Assessment: Danger to Self: No Self-injurious Behavior: No Danger to Others: No Physical Aggression / Violence: No Duty to Warn: No Access to Firearms a concern: No  Assessment of progress:  progressing  Diagnosis:   ICD-10-CM   1. Recurrent major depression resistant to treatment ([place])  F33.9     2. Social anxiety disorder  F40.10     3. Caregiver stress  Z63.6     4. Mixed obsessional thoughts and acts  F42.2     5. Relationship problem with family members  Z63.8     6. 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  Continue being willing to decline  services or interactions where needed. Family assistance, risk of perceived codependency -- Self-affirm that wanting to help is not dysfunctional, all calls are judgment calls.  Option Al-Anon for support and boundary help.  Endorse connecting [place] to further help, either through [facility] or [facility], and recruiting family members into necessary confrontation. Relationship with H -- Open to join tx.  Consider addressing H's overinterpretation of "choosing" FOO over FOI/him and perceived  negativity toward [place]. Consider further willingness to challenge sexual habits on grounds of feeling left out and/or objectified, and ask for H to confer re sexual expectations rather than simply accede to perceived demands.  Overall, take care to approach one issue at a time. Parenting -- Continue appropriate efforts to shape Casey Diaz's socializing and responsibility to clean up after himself, e.g., "after you ___, then you can ___".  Re. perceived loss of close relationship, self-affirm that she always wanted to create a "buddy" but any adolescent boy still distances and may go through a sullen, isolative period.  Other options for therapy for him. Depressive thinking -- Generally, look for thought patterns of shaming self irrationally and collecting troubles to the point of desperation and dispute.  Both are distress-making and interfere with interpersonal effectiveness. Intrusive thoughts and checking compulsions -- Practice ad lib pressing on without checking corners and spaces for imaginary abused children.  Same with driving and return checking to see if she hit someone.  Practice trust and move on. Self-care -- Continue efforts to engage exercise, part-time work, and supportive relationship outside the home.  Continue to grow in reasonably representing her physical limitations and needs without self-shaming. Other recommendations/advice as may be noted above Continue to utilize previously learned skills ad lib Maintain medication as prescribed and work faithfully with relevant prescriber(s) if any changes are desired or seem indicated Call the clinic on-call service, 988/hotline, 911, or present to [facility] or ER if any life-threatening psychiatric crisis Return for time as available. Already scheduled visit in this office 12/20/2022.  Blanchie Serve, PhD Luan Moore, PhD LP Clinical Psychologist, [facility] [address] [place], [place] [address] [phone]

## 2022-12-20 ENCOUNTER — Ambulatory Visit: Payer: 59 | Admitting: Psychiatry

## 2022-12-26 ENCOUNTER — Other Ambulatory Visit: Payer: Self-pay | Admitting: Psychiatry

## 2022-12-27 ENCOUNTER — Other Ambulatory Visit: Payer: Self-pay | Admitting: Psychiatry

## 2022-12-27 DIAGNOSIS — F4010 Social phobia, unspecified: Secondary | ICD-10-CM

## 2023-01-03 NOTE — Progress Notes (Signed)
CMA NOTE  Pt arrived for her weekly Spravato Treatment, she started Spravato treatments on 10/07/2021, she continues with 84 mg (3 of the 28 mg) Spravato nasal spray once a week treatments, which is the maintenance dose for her treatment resistant depression.    She was directed to the treatment room to get vitals taken first. Initial vital signs are B/P at 3:15 PM 85/68, 92, pulse ox 98%. Pt instructed to blow her nose and to recline back at 45 degrees. Pt given first nasal spray (28 mg) administered by pt observed by nurse. Pt always has to go to the restroom after her first dose, able to walk to bathroom safely.There were 5 minutes between each dose, total of 84 mg. Tolerated well. Pt's medication is delivered by [facility] in [place] and stored inside a safe behind a locked door as well. Spravato is a CIII medication and has to be only given at a treatment facility and observed by nurse as pt administered intranasally.  Pt's 40 minute vital signs at 4:00 PM 90/68, pulse 97, pulse ox 94%.  Dr. Clovis Pu met with pt to discuss her care at the end of her treatment when her thoughts are clearer and they discussed her medication and moods. Discharge vitals at 5:15 PM 88/74, 91, pulse ox 98%. Pt stable for discharge.   Pt was observed a total of 120 minutes per FDA/REMS requirements. Marland Kitchen    [id number]   EXP OCT 2026

## 2023-01-10 ENCOUNTER — Ambulatory Visit: Payer: 59

## 2023-01-10 ENCOUNTER — Ambulatory Visit (INDEPENDENT_AMBULATORY_CARE_PROVIDER_SITE_OTHER): Payer: 59 | Admitting: Psychiatry

## 2023-01-10 VITALS — BP 110/86 | HR 63

## 2023-01-10 DIAGNOSIS — F5105 Insomnia due to other mental disorder: Secondary | ICD-10-CM | POA: Diagnosis not present

## 2023-01-10 DIAGNOSIS — F339 Major depressive disorder, recurrent, unspecified: Secondary | ICD-10-CM

## 2023-01-10 DIAGNOSIS — F422 Mixed obsessional thoughts and acts: Secondary | ICD-10-CM

## 2023-01-10 DIAGNOSIS — F4010 Social phobia, unspecified: Secondary | ICD-10-CM

## 2023-01-11 ENCOUNTER — Encounter: Payer: Self-pay | Admitting: Psychiatry

## 2023-01-11 NOTE — Progress Notes (Signed)
[name]:[id number] 11-Jan-1966 57 y.o.    Subjective:   Patient ID:  Casey Diaz is a 57 y.o. (DOB 22-Nov-1965) female.  Chief Complaint:  Chief Complaint  Patient presents with   Follow-up   Anxiety   Depression   Stress     HPI [name] presents to the office today for follow-up of OCD and severe anxiety.     December 2019 visit the following was noted: No meds were changed. Lives in [place] and back for followup.  Sx are about the same.  Has to take meds with different sizes. Pt reports that mood is Anxious and Depressed and describes anxiety as Severe. Anxiety symptoms include: Excessive Worry, Obsessive Compulsive Symptoms:   Checking,,. Pt reports has interrupted sleep and nocturia. Pt reports that appetite is good. Pt reports that energy is no change and down slightly. Concentration is down slightly. Suicidal thoughts:  denied by patient. Loves the environment of [place] but misses some things there.  She's not able to work there.  H works there and likes it.  Struggled with not working, feels isolated and not up to task of meeting people.  Does attend a church and met a friend who's been helpful.  Leaving for [place] on 10/16/18.   04/09/2020 appointment the following is noted:  Staying another year in [place] bc Covid and other things. Last few months a lot of crying spells.  Is in menopause. Wonders about med changes though is nervous about it.  Crying spells associated with depressing thoughts more than stress or OCD.   Covid really hard on everyone and couldn't see family for 18 mos.  Family still very dysfunctional. No close friends in part due to OCD and depression. Son high Autism spectrum with ADHD and anxiety and she's with him all the time. Greater health problems with CP so more pains.   05/15/20 appt with the following noted: [name] for menopause and helps some. Still depressed.  Chronically. In [place] for 2 more weeks then to [place] for  another year. A lot of stressors lately triggering more checking and anxiety.   OCD is her CC now and seems.  Got worse DT stress.   Stressed with Asberger's son and her health.  H works a lot.  Her FOO still stress. Plan: Trintellix 10 mg 1 tablet in the morning with food and reduce fluvoxamine to 5 tablets nightly for 1 week  then reduce it to 4 tablets nightly.   07/02/20 appt with the following noted: Decided not to get Trintellix bc difficulty getting it. It is available.  There.  Wants to start it now.   Both depression and OCD are severe.  Not suicidal in intent or plan. Did not take samples with her to [place] but will be back in December. covid is worse there and travel is difficult.  Wants to reduce Wellbutrin DT dry mouth. Plan: She's afraid to reduce Luvox at this time DT fear of worsening OCD.  But will consider. Trintellix 10 mg 1 tablet in the morning with food and reduce fluvoxamine to 5 tablets nightly for 1 week  then reduce it to 4 tablets nightly. Also reduce Wellbutrin XL to 300 mg daily.    9-13 2022 appointment with the following noted: Back in [place] since July 14.  Broke arm a month ago and surgery.  It's all been rough adjustment.   B has cancer on his face and M fell taking him to the doctor.  Misses the water and weather of [place].   Cry a lot more since menopause. Still depression and anxiety and OCD.  Asks about ketamine. On Wellbutrin 300, Luvox 300.  No Trintellix. Added Ativan 2 mg AM and HS and it helps.  More likely to get upset at night. Plan: Increase Luvox back to 400 mg daily.  She thinks she's worse on less. Continue Wellbutrin XL to 300 mg daily. 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  She tolerated it well without unusual nausea or vomiting headache or other somatic symptoms.  She did have the expected dissociation which gradually resolved over  the course of the 2-hour period of observation.  She was a little concerned about her balance given her cerebral palsy but has not noted unusual or unexpected problems.  She is motivated to can continue Spravato in hopes of reducing her depressive symptoms. 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  She also has treatment resistant OCD which is partially managed with medications but is still quite disabling.  She is tolerating the medications well.  She is sleeping adequately.  Her appetite is adequate.  She is not having suicidal thoughts.  She continues to wish for a better treatment for OCD that would give her some relief.  09/30/2021 appointment with the following noted: She received her first dose of Spravato 84 mg intranasally today.  She tolerated it well without unusual nausea, vomiting, or other somatic symptoms.  Dissociation as expected did occur and gradually resolved over the 2-hour period of observation.  She did have a mild headache today with the treatment and received ibuprofen 600 mg at her request.  We will follow this to see if it is a pattern Patient is still depressed.  She said she was late with her medicine today and today was a particularly depressing day.  However she notes that the Spravato has lifted her mood considerably even today.  She is hopeful that it will continue to be helpful.  No suicidal thoughts.  She has ongoing chronic anxiety and OCD at baseline.  10/04/21 appt noted: Patient received Spravato 84 mg for the second time today.  She tolerated it well without any unusual headache, nausea or vomiting or other somatic symptoms.  Dissociation did occur and she gradually [place] resolution over the 2-hour period of observation. She did not have any unusual problems after she left the office last Spravato administration.  She did not have any specific problems with balance or walking.  She is at increased risk of that difficulty because of  cerebral palsy.  So far she has not noticed much mood effect from the medication beyond the first day of receiving it.  However she would like to continue Spravato in hopes of getting the antidepressant effect that is desired. Stress dealing with mother's behavior at party pt hosted.  Guilt over it.  10/07/2021 appointment noted: Patient received Spravato 84 mg for the second time today.  She tolerated it well without any unusual headache, nausea or vomiting or other somatic symptoms.  Dissociation did occur and she gradually [place] resolution over the 2-hour period of observation. She still is not sure about the antidepressant effect of Spravato.  Events over the holidays and demands, make it difficult to assess.  She still notes that the OCD tends to worsen the depression and vice versa.  She tolerates the  Spravato well and wants to continue the trial.  10/15/2021 appointment with the following noted: Patient received Spravato 84 mg for the second time today.  She tolerated it well without any unusual headache, nausea or vomiting or other somatic symptoms.  Dissociation did occur and she gradually [place] resolution over the 2-hour period of observation. Patient says it was somewhat difficult to evaluate the effect of the Spravato.  It was scheduled to be twice weekly for 4 weeks consecutively but the holidays have interfered with that administration.  She asked what specifically should be she should be looking for in order to assess improvement.  That was discussed.  The OCD is unchanged and the depression so far is not significantly different.  She still tolerates meds.  There have been no recent med changes  10/19/2021 appt noted: Patient received Spravato 84 mg for the second today.  She tolerated it well without any unusual headache, nausea or vomiting or other somatic symptoms.  Dissociation did occur and she gradually saw resolution over the 2-hour period of observation.   10/21/2021 appointment  noted: Patient received Spravato 84 mg today.  She tolerated it well without any unusual headache, nausea or vomiting or other somatic symptoms.  Dissociation did occur and she gradually saw resolution over the 2-hour period of observation.  She feels better than last week.  She is not as depressed and down.  She is still dealing with grief around the death of her cousin that was unexpected.  It is still difficult to tell how much the Spravato was doing but she is hopeful.  Anxiety is still present with the OCD.  She is not having suicidal thoughts.  She is not hopeless.  She wants to continue treatment.  10/25/2021 appointment with the following noted: Patient received Spravato 84 mg today.  She tolerated it well without any unusual headache, nausea or vomiting or other somatic symptoms.  Dissociation did occur and she gradually saw resolution over the 2-hour period of observation.  She does not typically find the dissociation very strong. She is beginning to think the Spravato is helping somewhat with the depression.  It has been difficult to tell with the holidays intervening as well as the death of her cousin.  She has not been able to get Spravato twice weekly for 4 weeks straight as typically planned.  However she is hopeful.  The OCD remains significant.  She still has a tendency to think very negatively.  She is not suicidal.  10/28/2021 appointment with the following noted: Patient received Spravato 84 mg today.  She tolerated it well without any unusual headache, nausea or vomiting or other somatic symptoms.  Dissociation did occur and she gradually saw resolution over the 2-hour period of observation.  She does not typically find the dissociation very strong. She is feeling more hopeful about the administration of Spravato.  She is having less depression she believes.  Still not dramatically different.  She still has a tendency to have a lot of anxiety and rumination and OCD.  She is not suicidal.   She is eager to continue the Spravato.  11/01/2021 appointment with the following noted: Patient received Spravato 84 mg today.  She tolerated it well without any unusual headache, nausea or vomiting or other somatic symptoms.  Dissociation did occur and she gradually saw resolution over the 2-hour period of observation.  She does not typically find the dissociation very strong. She is continuing to see a little bit of improvement in depression  with Spravato.  The anxiety remains but may be not as severe.  The OCD remains markedly severe chronically.  She is not suicidal.  She is encouraged by the degree of improvement with Spravato and inability to enjoy things more and not be quite as ruminative.  11/04/2021 appt noted: Patient received Spravato 84 mg today.  She tolerated it well without any unusual headache, nausea or vomiting or other somatic symptoms.  Dissociation did occur and she gradually saw resolution over the 2-hour period of observation.  She does not typically find the dissociation very strong. No SE complaints with meds. She continues to feel hopeful about the Spravato.  She has less depression.  Because of a number of factors she is uncertain of the full benefit but thinks she is somewhat less depressed.  Her anxiety and OCD remain significant but a little better.  She is tolerating the medications and does not desire medicine change.  She is not currently complaining of insomnia.   11/08/2021 appointment the following noted: Patient received Spravato 84 mg today.  She tolerated it well without any unusual headache, nausea or vomiting or other somatic symptoms.  Dissociation did occur and she gradually saw resolution over the 2-hour period of observation.  She does not typically find the dissociation very strong. No SE complaints with meds. She feels the Spravato is helping somewhat.  She would like to see a greater effect.  However she is able to enjoy things.  She is productive at home.   She would like to see a lifting of a degree of sadness that remains.  The anxiety and OCD remained largely unchanged.  She wondered about the dosing of Wellbutrin 300 mg a day and Luvox 300 mg a day and possible increases.  She has been at higher doses in the past.  She plans to start water therapy for her weakness and for her shoulder.  11/11/2021 appointment with the following noted: Patient received Spravato 84 mg today.  She tolerated it well without any unusual headache, nausea or vomiting or other somatic symptoms.  Dissociation did occur and she gradually saw resolution over the 2-hour period of observation.  She does not typically find the dissociation very strong. No SE complaints with meds. She feels the Spravato is clearly helping the depression.  She would like to see a more significant effect.  She is still having trouble thinking positive. Her energy is fair.  Concentration is good except for the problem with chronic obsessions. She has been taking Wellbutrin 300 mg in Luvox 300 mg for quite some time but has taken higher doses in the past.  We discussed that.  She would like to try higher doses in order to get a better effect if possible. We just increased the doses a couple of days ago.  No effect yet.  11/15/2021 appointment with the following noted: Patient received Spravato 84 mg today.  She tolerated it well without any unusual headache, nausea or vomiting or other somatic symptoms.  Dissociation did occur and she gradually saw resolution over the 2-hour period of observation.  She does not typically find the dissociation very strong. No SE complaints with meds. The patient is now convinced that the Spravato is helping the depression.  She would like to continue twice weekly Spravato this week if possible.  She has tolerated the increase in Wellbutrin to 450 mg daily and the increase and fluvoxamine to 400 mg daily without complications thus far.  The OCD and anxiety feed  the  depression to some extent. She spends approximately 2 hours daily with checking compulsions due to obsessions about causing harm to others.  For example fearing that when she has hit a pot hole that she may have hit a person and going back to check.  Checking corners and rooms out of fear that she may have harmed someone.  Other various checking compulsions.  She is hoping the increase in fluvoxamine to 400 mg will reduce that over the weeks to come.  She is not seeing a significant difference with the addition of the Spravato though she understands that was not expected.  She is more productive at home and more motivated and able to enjoy things more fully as a result of the Spravato treatment.  She is tolerating the medication  11/18/2021 appointment with the following noted: Patient received Spravato 84 mg today.  She tolerated it well without any unusual headache, nausea or vomiting or other somatic symptoms.  Dissociation did occur and she gradually saw resolution over the 2-hour period of observation.  She does not typically find the dissociation very strong. No SE complaints with meds. She clearly believes the Spravato has been helpful for the depression.  She wonders whether to continue to treatments weekly or to cut back to 1 weekly.  She would like to continue twice weekly in hopes of getting additional improvement in the depression because it is not resolved but it is difficult to get here twice a week in terms of arranging rides. She is recently increased Wellbutrin XL to 450 mg daily and fluvoxamine to 400 mg daily but they have not had time to have an official effect.  She is tolerating that well.  She is tolerating meds overwork overall well. The OCD remains the same as noted on 11/15/2021  11/25/21 appt noted: Patient received Spravato 84 mg today.  She tolerated it well without any unusual headache, nausea or vomiting or other somatic symptoms.  Dissociation did occur and she gradually saw  resolution over the 2-hour period of observation.  She does not typically find the dissociation very strong. No SE complaints with meds. She thinks the increase in Wellbutrin and Luvox have been potentially helpful for depression and OCD respectively.  It has been too early to see the full effect.  She is sleeping and eating well.  She is functioning at home.  She still spends a lot of time that is about 2 hours a day dealing with compulsive behaviors.  12/02/21 appt noted: Patient received Spravato 84 mg today.  She tolerated it well without any unusual headache, nausea or vomiting or other somatic symptoms.  Dissociation did occur and she gradually saw resolution over the 2-hour period of observation.  She does not typically find the dissociation very strong. No SE complaints with meds. Several losses and stressors recently that affect her sense of mood. However still sees significant benefit from the Spravato for her depression.  Wants to continue it. Suspect early  some benefit from the increased Wellbutrin for depression and Luvox for OCD. Tolerating meds. No complaints about the meds. Sleeping and eating well.  No new health concerns.  12/09/21 appt noted: Patient received Spravato 84 mg today.  She tolerated it well without any unusual headache, nausea or vomiting or other somatic symptoms.  Dissociation did occur and she gradually saw resolution over the 2-hour period of observation.  She does not typically find the dissociation very strong. No SE complaints with meds. Seeing noticeable improvement from  increase fluvoxamine to 400 mg daily.  Tolerating meds without concerns over them. Depression is stable with residual sx of easy guilt and easily stressed.  OCD contributes to depression but depression is not severe with less crying spells.  Productive at home with chores.  Enjoyed recent birthday.  Sleeping good. No new concerns.  12/23/2021 appointment noted: Patient received Spravato 84 mg  today.  She tolerated it well without any unusual headache, nausea or vomiting or other somatic symptoms.  Dissociation did occur and she gradually saw resolution over the 2-hour period of observation.  She does not typically find the dissociation very strong. No SE complaints with meds. Seeing noticeable improvement from increase fluvoxamine to 400 mg daily.  Tolerating meds without concerns over them. Her depression is somewhat improved with the Spravato.  She also feels generally a little lighter.  She is more motivated.  She is less overwhelmed by guilt.  The OCD is gradually improving but is still quite time-consuming as noted before.  She is sleeping well.  No side effects  12/30/2021 appointment with the following noted: Patient received Spravato 84 mg today.  She tolerated it well without any unusual headache, nausea or vomiting or other somatic symptoms.  Dissociation did occur and she gradually saw resolution over the 2-hour period of observation.  She does not typically find the dissociation very strong. No SE complaints with meds. Seeing noticeable improvement from increase fluvoxamine to 400 mg daily.  Tolerating meds without concerns over them. She is confident of her the improvement seen with Spravato.  She is less hopeless.  Guilt is marked remarkably improved.  She is not having any thoughts of death or dying.  She is more motivated for activities such as exercise which she is recently started.  She is sleeping well. The OCD remains severe but it is improving somewhat with the increase in fluvoxamine.  It is still consuming a couple hours per day.  01/10/22 apravato 84 admin  01/24/22 appt noted: Patient received Spravato 84 mg today.  She tolerated it well without any unusual headache, nausea or vomiting or other somatic symptoms.  Dissociation did occur and she gradually saw resolution over the 2-hour period of observation.  She does not typically find the dissociation very strong. No  SE complaints with meds. Very tearful today.  Feels like she has been suppressing emotion in the Spravato caused it to be released.  Discussed some stressors.  Overall still feels the medicine is helpful.  She has missed some of the scheduled Spravato treatments that were intended to be weekly due to circumstances beyond her control.  She is still struggling with OCD as previously noted but does believe the medications are helpful. Plan no med changes  01/31/2022 received Spravato 84 mg today  02/09/2022 appointment with the following noted: Patient received Spravato 84 mg today.  She tolerated it well without any unusual headache, nausea or vomiting or other somatic symptoms.  Dissociation did occur and she gradually saw resolution over the 2-hour period of observation.  She does not typically find the dissociation very strong. No SE complaints with meds. Spravato clearly helps depression and OCD but easily gets overwhelmed and tearful with fairly routine stressors.  Tolerating meds. Sleep and appetite is OK Asks to increase lorazepam to 2 mg AM and HS and 67m afternoon  02/16/22 appt noted: Patient received Spravato 84 mg today.  She tolerated it well without any unusual headache, nausea or vomiting or other somatic symptoms.  Dissociation did  occur and she gradually saw resolution over the 2-hour period of observation.  She does not typically find the dissociation very strong. No SE complaints with meds. She has chronic depesssion and OCD but is improved with Spravato, both dx versus before.  She has continued Luvox 400 mg and Wellbutrin 450 mg and is tolerating it.  Chronically easily stressed.  Tolerating all meds.  Doesn't like taking more meds.  Spending a couple hours daily with OCD.  No SI No med changes.  02/21/22 appt noted:   Doesn't like taking more meds.  Spending a couple hours daily with OCD.  No SI No med changes.  02/21/22 appt noted: Patient received Spravato 84 mg today.  She  tolerated it well without any unusual headache, nausea or vomiting or other somatic symptoms.  Dissociation did occur and she gradually saw resolution over the 2-hour period of observation.  She does not typically find the dissociation very strong. No SE complaints with meds. She has chronic depesssion and OCD but is improved with Spravato, both dx versus before.  She has continued Luvox 400 mg and Wellbutrin 450 mg and is tolerating it.  Chronically easily overwhelmed and doesn't know why.  Tolerating all meds. Wants to continue meds.  03/16/22 appt noted: Patient received Spravato 84 mg today.  She tolerated it well without any unusual headache, nausea or vomiting or other somatic symptoms.  Dissociation did occur and she gradually saw resolution over the 2-hour period of observation.  She does not typically find the dissociation very strong. No SE complaints with meds. Overall she still feels the Spravato has been helpful not only for her depression but also for her OCD which was somewhat unexpected.  OCD is still significant but it is less severe than prior to starting Spravato.  She is tolerating Luvox 400 mg and Wellbutrin 450 mg.  We discussed possible med adjustments.  03/23/22 appt noted: Patient received Spravato 84 mg today.  She tolerated it well without any unusual headache, nausea or vomiting or other somatic symptoms.  Dissociation did occur and she gradually saw resolution over the 2-hour period of observation.  She does not typically find the dissociation very strong. No SE complaints with meds. She is still depressed and still has OCD of course but is improved with the Spravato.  She is tolerating the medications well.  We had previously discussed the possibility of switching some of the Wellbutrin to [name] and she is very interested in that in hopes of further improvement in depression and OCD.  She understands that Auvelity is not used for OCD on the label.  She is tolerating the  medications.  She is still easily overwhelmed.  She is sleeping and eating okay.. Plan: Reduce Wellbutrin XL to 300 mg AM and add Auvelity 1 tablet each AM  03/30/22 appt noted: Patient received Spravato 84 mg today.  She tolerated it well without any unusual headache, nausea or vomiting or other somatic symptoms.  Dissociation did occur and she gradually saw resolution over the 2-hour period of observation.  She does not typically find the dissociation very strong. No SE complaints with meds. She is still depressed and still has OCD of course but is improved with the Spravato.  She is tolerating the medications well.  No difference with Auvelity 1 AM so far and no SE.  Going on vacation on Saturday. 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 Sleep and appetite good. Plan: Increase Auvelity to 1 twice daily and reduce Wellbutrin  to XL 150 every morning  04/14/2022 appointment with the following noted: Patient received Spravato 84 mg today.  She tolerated it well without any unusual headache, nausea or vomiting or other somatic symptoms.  Dissociation did occur and she gradually saw resolution over the 2-hour period of observation.  She does not typically find the dissociation very strong. No SE complaints with meds. She is still depressed and still has OCD of course but is improved with the Spravato.  She is tolerating the medications well.  Just increased Auvelity to BID yesterday and reduced Wellbutrin to 150 AM. No SE so far.  No change in mood or anxiety so far.  Chronic OCD as noted and residual depression and chronic fatigue.  04/21/2022 appointment with the following noted: Patient received Spravato 84 mg today.  She tolerated it well without any unusual headache, nausea or vomiting or other somatic symptoms.  Dissociation did occur and she gradually saw resolution over the 2-hour period of observation.  She does not typically find the dissociation very strong. No SE complaints with  meds. She is still depressed and still has OCD of course but is improved with the Spravato.   She has questions about the dosing of lorazepam. She tends to have negative anxious thoughts at night.  This tends to interfere with her ability to go to sleep.  She is getting about 8 to 9 hours of sleep.  She is tolerating the meds without excessive sedation and does not nap during the day.  05/06/22 appt noted: Patient received Spravato 84 mg today.  She tolerated it well without any unusual headache, nausea or vomiting or other somatic symptoms.  Dissociation did occur and she gradually saw resolution over the 2-hour period of observation.  She does not typically find the dissociation very strong. No SE complaints with meds. She is still depressed and still has OCD of course but is improved with the Spravato.   Had some questions about timing of dosing of fluvoxamine and Auvelity. OCD is not quite as time consuming.  Sleep and eating are the same.   No SE meds.  05/25/22 appt noted: Patient received Spravato 84 mg today.  She tolerated it well without any unusual headache, nausea or vomiting or other somatic symptoms.  Dissociation did occur and she gradually saw resolution over the 2-hour period of observation.  She does not typically find the dissociation very strong. No SE complaints with meds. She is still depressed and still has OCD of course but is improved with the Spravato.   She has less OCD when away from home and on vacation of note. Plan: Rec gradually reduce HS lorazepam to 1 mg Hs.  Can continue lorazepam 2 mg AM and 1 mg in afternoon bc of chronic anxiety and it is helpful and tolerated. She can continue temazepam 30 mg nightly.  She tends to have a lot of anxious negative thoughts at night when she is trying to go to bed  06/16/22 appt noted: Patient received Spravato 84 mg today.  She tolerated it well without any unusual headache, nausea or vomiting or other somatic symptoms.   Dissociation did occur and she gradually saw resolution over the 2-hour period of observation.  She does not typically find the dissociation very strong. No SE complaints with meds. She is still depressed and still has OCD of course but is improved with the Spravato.   She has less OCD when away from home and on vacation of note. She is tolerating  the medications.  She has continued current medications. 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  She still receives benefit from [place].  Her depression and OCD are better with the Spravato.  She thinks she is getting additional benefit with the switch from Wellbutrin to [place].  07/04/2022 appointment noted: Reports she developed a rash on her face from [facility] and feels like she is allergic to it.  She stopped it and went back to Wellbutrin 450 mg every morning.  The rash has cleared up.  She did not require any medical attention and did not have shortness of breath. Overall her depression and OCD are about the same as they have been.  She did not notice a substantial difference from the brief treatment with Auvelity but she understands she did not take a full course.  She is tolerating the current medicines well. Current meds fluvoxamine 400 mg daily, Wellbutrin XL 450 mg daily, lorazepam 1 to 2 mg in the morning and 1 to 2 mg at night and 1 mg in the afternoon, temazepam 30 mg nightly. She wants to continue the Spravato because she feels it has been helpful for both her depression and her racing OCD  07/18/22 appt noted: Patient received Spravato 84 mg today.  She tolerated it well without any unusual headache, nausea or vomiting or other somatic symptoms.   Dissociation did occur and she gradually saw resolution over the 2-hour period of observation.  She does not typically find the dissociation very strong. No SE complaints with meds. She is still depressed and still has OCD of course but is improved with the Spravato.  Rash better off Auvelity and back on Welllbutrin XL 450 mg AM, fluvoxamine 400 mg daily.  08/15/22 appt noted: Current psych meds: Wellbutrin XL 450 mg AM, fluvoxamine 100 mg in AM and 300 mg HS, lorazepam 1 mg 1-2 mg in the AM and HS and 1 tablet prn midday for anxiety, temazepam 30 mg HS Patient received Spravato 84 mg today.  She tolerated it well without any unusual headache, nausea or vomiting or other somatic symptoms.  Dissociation did occur and she gradually saw resolution over the 2-hour period of observation.  She does not typically find the dissociation very strong. No SE complaints with meds. She has a great deal of stress dealing with her family.  Disc brother's ongoing mania and difficulty getting him help and the stress he causes for the family. She wants to continue Spravato through this very stressful holdicay season and reevaluate the frequency after the New Year.  09/12/22 appt noted: Current psych meds: Wellbutrin XL 450 mg AM, fluvoxamine 100 mg in AM and 300 mg HS, lorazepam 1 mg 1-2 mg in the AM and HS and 1 tablet prn midday for anxiety, temazepam 30 mg HS Patient received Spravato 84 mg today.  She tolerated it well without any unusual headache, nausea or vomiting or other somatic symptoms.  Dissociation did occur and she gradually saw resolution over the 2-hour period of observation.  She does not typically find the dissociation very strong. No SE complaints with meds. She has a great deal of stress dealing with her family.  Disc brother's  ongoing mania and difficulty getting him help and the stress he causes for the family. She wants to continue Spravato through this very stressful holdicay season and  reevaluate the frequency after the New Year.  Chronically easily overwhelmed with family. Complaining of HA and history migraine.  Asks for increase imitrex and disc preventatives like propranolol ER  09/26/22 appt noted: Current psych meds: Wellbutrin XL 450 mg AM, fluvoxamine 100 mg in AM and 300 mg HS, lorazepam 1 mg 1-2 mg in the AM and HS and 1 tablet prn midday for anxiety, temazepam 30 mg HS Patient received Spravato 84 mg today.  She tolerated it well without any unusual headache, nausea or vomiting or other somatic symptoms.  Dissociation did occur and she gradually saw resolution over the 2-hour period of observation.  She does not typically find the dissociation very strong. No SE complaints with meds. She has a great deal of stress dealing with her family.  This is ongoing The holidays are much more stressful DT family problems.  She is noting OCD is much worse over the last couple of week.  Depression is better with Spravato. Needed higher dose meds for migraine.   10/03/22 appt noted: Current psych meds: Wellbutrin XL 450 mg AM, fluvoxamine 100 mg in AM and 300 mg HS, lorazepam 1 mg 1-2 mg in the AM and HS and 1 tablet prn midday for anxiety, temazepam 30 mg HS Patient received Spravato 84 mg today.  She tolerated it well without any unusual headache, nausea or vomiting or other somatic symptoms.  Dissociation did occur and she gradually saw resolution over the 2-hour period of observation.  She does not typically find the dissociation very strong. No SE complaints with meds. She has now realized that the rash she previously previously attributed to [facility] was not related.  She is interested may be retrying that after the holidays.  She is tolerating medications otherwise. The holidays remain chronically stressful to her due to family dynamic problems which cause her to consistently feel stuck.  Under more stress her OCD is worse.  She will have a tendency to have crying spells.  The  depression and OCD are still improved with Spravato as compared to before.  11/15/22 appt noted: Current psych meds: Wellbutrin XL 450 mg AM, fluvoxamine 100 mg in AM and 300 mg HS, lorazepam 1 mg 1-2 mg in the AM and HS and 1 tablet prn midday for anxiety, temazepam 30 mg HS Patient received Spravato 84 mg today.  She tolerated it well without any unusual headache, nausea or vomiting or other somatic symptoms.  Dissociation did occur and she gradually saw resolution over the 2-hour period of observation.  She does not typically find the dissociation very strong. No SE complaints with meds. Continues to feel depressed and overwhelmed by family problems including her brother's mania and recent eviction and commitment.  Chronic OCD worse when stressed.  No SI.  Tolerating meds. Plan: Per her request continue Wellbutrin XL 450 mg every morning. She has come to the realization that the rash she had previously attributed to [facility] was not related.  She is interested in perhaps retrying Auvelity. There are few alternative medication options that remain.  01/11/23 appt noted: Current psych meds: Wellbutrin XL 150 mg BID and started Auvelity 1 AM, fluvoxamine 100 mg in AM and 300 mg HS, lorazepam 1 mg 1-2 mg in the AM and HS and 1 tablet prn midday for anxiety, temazepam 30 mg HS Patient received  Spravato 84 mg today.  She tolerated it well without any unusual headache, nausea or vomiting or other somatic symptoms.  Dissociation did occur and she gradually saw resolution over the 2-hour period of observation.  She does not typically find the dissociation very strong. No SE complaints with meds. Trouble getting to sessions lately DT transportation problems.   Continues to feel depressed and overwhelmed by OCD and family.  Feels she needs toevery other week Spravato bc it helps for a couple of weeks and then seems to wear off.  Struggleing with OCD and depression both of which are eased by Spravato. Less  crying with Auvelity.  Previous psych med trials include Prozac, paroxetine, sertraline, fluvoxamine, venlafaxine, Anafranil with no response,  Wellbutrin, , Viibryd, Trintellix 10 1 month NR Auvelity BID NR Geodon,  risperidone, Rexulti, Abilify,  Seroquel, Latuda 40 mg with irritability.  lamotrigine lithium,  BuSpar, Namenda,  pramipexole with no response, and Topamax, pindolol  ECT-MADRS    [place] Office Visit from 06/29/2021 in [place] Total Score 36      Flowsheet Row Admission (Discharged) from 06/11/2021 in [facility] No Risk        Review of Systems:  Review of Systems  Constitutional:  Positive for fatigue.  Cardiovascular:  Negative for palpitations.  Musculoskeletal:  Positive for arthralgias, back pain, gait problem and neck pain. Negative for joint swelling.  Neurological:  Positive for weakness and headaches. Negative for tremors.  Psychiatric/Behavioral:  Positive for dysphoric mood. Negative for suicidal ideas. The patient is nervous/anxious.     Medications: I have reviewed the patient's current medications.  Current Outpatient Medications  Medication Sig Dispense Refill   Abaloparatide (TYMLOS) 3120 MCG/1.56ML SOPN Inject into the skin.     Azelastine-Fluticasone 137-50 MCG/ACT SUSP Place 1-2 sprays into both nostrils daily.     baclofen (LIORESAL) 10 MG tablet Take 20 mg by mouth at bedtime as needed for muscle spasms.     buPROPion (WELLBUTRIN XL) 150 MG 24 hr tablet TAKE 3 TABLETS BY MOUTH DAILY 270 tablet 1   dicyclomine (BENTYL) 10 MG capsule Take 10 mg by mouth daily.     docusate sodium (COLACE) 100 MG capsule Take 1 capsule (100 mg total) by mouth 2 (two) times daily. (Patient taking differently: Take 100 mg by mouth daily.) 10 capsule 0   Esketamine HCl, 84 MG Dose, (SPRAVATO, 84 MG DOSE,) 28 MG/DEVICE SOPK USE 3 SPRAYS IN EACH NOSTRIL EVERY OTHER WEEK 3 each 0    fexofenadine (ALLEGRA) 180 MG tablet Take 180 mg by mouth daily.     fluvoxaMINE (LUVOX) 100 MG tablet 1 tablet in the AM and 3 tablets at night 360 tablet 0   hydrocortisone (ANUSOL-HC) 2.5 % rectal cream Place rectally 2 (two) times daily. x 7-14 days 30 g 0   ketotifen (ZADITOR) 0.025 % ophthalmic solution Place 3 drops into both eyes at bedtime.     LORazepam (ATIVAN) 1 MG tablet TAKE 1-2 IN THE AM AND 1-2 TABLETS EVERY NIGHT AT BEDTIME AND 1 TABLET IN AFTERNOON WHEN NEEDED FOR ANXIETY AND SLEEP 150 tablet 1   magnesium gluconate (MAGONATE) 500 MG tablet Take 500 mg by mouth daily.     MIBELAS 24 FE 1-20 MG-MCG(24) CHEW Chew 1 tablet by mouth at bedtime as needed (bowel regularity).     Multiple Vitamins-Minerals (ADULT GUMMY PO) Take 2 tablets by mouth in the morning.  nitrofurantoin (MACRODANTIN) 100 MG capsule Take 100 mg by mouth as needed (For urinary tract infection.).      oxyCODONE-acetaminophen (PERCOCET/ROXICET) 5-325 MG tablet Take 1-2 tablets by mouth every 6 (six) hours as needed for severe pain. 50 tablet 0   polyethylene glycol (MIRALAX / GLYCOLAX) packet Take 17 g by mouth daily as needed for mild constipation. 14 each 0   propranolol ER (INDERAL LA) 60 MG 24 hr capsule TAKE 1 CAPSULE BY MOUTH EVERY DAY 90 capsule 1   psyllium (METAMUCIL) 58.6 % powder Take 1 packet by mouth daily as needed (constipation).     SUMAtriptan (IMITREX) 100 MG tablet Take 1 tablet (100 mg total) by mouth every 2 (two) hours as needed for migraine. May repeat in 2 hours if headache persists or recurs. 10 tablet 1   temazepam (RESTORIL) 30 MG capsule TAKE 1 CAPSULE BY MOUTH AT BEDTIME AS NEEDED FOR SLEEP 30 capsule 2   Vitamin D-Vitamin K (VITAMIN K2-VITAMIN D3 PO) Take 1-2 sprays by mouth daily.     No current facility-administered medications for this visit.    Medication Side Effects: None   Allergies:  Allergies  Allergen Reactions   Hydrocodone Itching   Sulfamethoxazole-Trimethoprim  Itching   Dust Mite Extract Other (See Comments)    Sneezing, watery eyes, runny nose   Latex Itching   Other Other (See Comments)    PT IS ALLERGIC TO CAT DANDER AND RAGWEED - Sneezing, watery eyes, runny nose    Pollen Extract Other (See Comments)    Sneezing, watery eyes, runny nose     Past Medical History:  Diagnosis Date   Abnormal Pap smear 2011   hpv/mild dysplasia,cin1   Anxiety    Cerebral palsy (HCC)    right arm/leg   Cystocele    Depression    Headache    Neuromuscular disorder (HCC)    Cerebral Palsy   OCD (obsessive compulsive disorder)    Osteoporosis    Uterine prolaps     Family History  Problem Relation Age of Onset   Cancer Father        skin AND LUNG   Alcohol abuse Sister        CRACK COCAINE    Social History   Socioeconomic History   Marital status: Married    Spouse name: Not on file   Number of children: Not on file   Years of education: Not on file   Highest education level: Not on file  Occupational History   Not on file  Tobacco Use   Smoking status: Never   Smokeless tobacco: Never  Substance and Sexual Activity   Alcohol use: Not Currently    Comment: OCCASIONAL beer   Drug use: No   Sexual activity: Yes    Birth control/protection: Pill    Comment: LOESTRIN 24 FE  Other Topics Concern   Not on file  Social History Narrative   Not on file   Social Determinants of Health   Financial Resource Strain: Not on file  Food Insecurity: Not on file  Transportation Needs: Not on file  Physical Activity: Not on file  Stress: Not on file  Social Connections: Not on file  Intimate Partner Violence: Not on file    Past Medical History, Surgical history, Social history, and Family history were reviewed and updated as appropriate.   Please see review of systems for further details on the patient's review from today.   Objective:   Physical Exam:  LMP  (  LMP Unknown)   Physical Exam Constitutional:      General: She is  not in acute distress. Neurological:     Mental Status: She is alert and oriented to person, place, and time.     Cranial Nerves: No dysarthria.     Motor: Weakness present.     Gait: Gait abnormal.  Psychiatric:        Attention and Perception: Attention and perception normal.        Mood and Affect: Mood is anxious and depressed. Affect is not labile or tearful.        Speech: Speech normal.        Behavior: Behavior normal. Behavior is cooperative.        Thought Content: Thought content normal. Thought content is not delusional. Thought content does not include homicidal or suicidal ideation. Thought content does not include suicidal plan.        Cognition and Memory: Cognition and memory normal. Cognition is not impaired.        Judgment: Judgment normal.     Comments: Insight intact Ongoing OCD remains fairly severe but less anxious Checking compulsions up to 2 hours daily but improved noticeably Chronic depression persistent but better with Spravato More stressed with chronic family problems recently worse.      Lab Review:     Component Value Date/Time   NA 138 06/11/2021 0606   K 4.0 06/11/2021 0606   CL 107 06/11/2021 0606   CO2 26 06/11/2021 0606   GLUCOSE 90 06/11/2021 0606   BUN 18 06/11/2021 0606   CREATININE 0.81 06/11/2021 0606   CALCIUM 9.4 06/11/2021 0606   PROT 6.5 06/11/2021 0606   ALBUMIN 3.3 (L) 06/11/2021 0606   AST 17 06/11/2021 0606   ALT 14 06/11/2021 0606   ALKPHOS 141 (H) 06/11/2021 0606   BILITOT 0.2 (L) 06/11/2021 0606   GFRNONAA >60 06/11/2021 0606   GFRAA >60 07/09/2016 0438       Component Value Date/Time   WBC 5.8 06/11/2021 0606   RBC 4.12 06/11/2021 0606   HGB 12.5 06/11/2021 0606   HCT 39.7 06/11/2021 0606   PLT 299 06/11/2021 0606   MCV 96.4 06/11/2021 0606   MCH 30.3 06/11/2021 0606   MCHC 31.5 06/11/2021 0606   RDW 13.9 06/11/2021 0606   LYMPHSABS 1.9 06/11/2021 0606   MONOABS 0.5 06/11/2021 0606   EOSABS 0.1 06/11/2021  0606   BASOSABS 0.0 06/11/2021 0606    No results found for: "POCLITH", "LITHIUM"   No results found for: "PHENYTOIN", "PHENOBARB", "VALPROATE", "CBMZ"   .res Assessment: Plan:    Kaleesha was seen today for follow-up, anxiety, depression and stress.  Diagnoses and all orders for this visit:  Recurrent major depression resistant to treatment [facility])  Mixed obsessional thoughts and acts  Social anxiety disorder  Insomnia due to mental condition    Both Dx are TR and marked.  Impaired function but less so with Spravato re: depression..   She is receiving Spravato 84 mg weekly and marked improvement in the depression..  she feels it also helps OCD somewhat.  However still easily overwhelmed with low stress tolerance.  The OCD is improved with the increase in fluvoxamine and with Spravato.  Spends 2 hours daily and checking compulsions on her worst days but better when she travels.  She has been on higher doses of fluvoxamine above the usual max of 400 mg daily in the past.    Disc SE. She would like to  continue Luvox 400 mg daily again    She is tolerating the meds well  Continue  Luvox back to 400 mg nightly as of January 2023. Disc dosing higher than usual.  She feels this is increase has helped more with OCD which remains chronically severe.  Contiinue Auvelity 1 AM and Wellbutrin XL 150 BID.  Option increase Auvelity BID and reduce Wellbutrin to 1 AM. There are few other alternative medication options that remain.  Disc Spravato DT TRD incl details and SE. Disc dosing and duration.  Pt with severe depression MADRS 36 on 06/29/21  Patient was administered Spravato 84 mg intranasally dosage today.  The patient experienced the typical dissociation which gradually resolved over the 2-hour period of observation.  There were no complications.  Specifically the patient did not have nausea or vomiting or headache.  Blood pressures remained within normal ranges at the 40-minute and 2-hour  follow-up intervals.  By the time the 2-hour observation period was met the patient was alert and oriented and able to exit without assistance. She tends to have lingering sedative effects but not severe. .  See nursing note for further details. Per protocol will continue Spravato to 84 mg weekly.  She has been okay since cutting back to once weekly.  We discussed the short-term risks associated with benzodiazepines including sedation and increased fall risk among others.  Discussed long-term side effect risk including dependence, potential withdrawal symptoms, and the potential eventual dose-related risk of dementia.  But recent studies from 2020 dispute this association between benzodiazepines and dementia risk. Newer studies in 2020 do not support an association with dementia. Disc this is high dose and not ideal.  Also disc risk combining it with temazepam. Rec gradually reduce HS lorazepam to 1 mg Hs.  Can continue lorazepam 2 mg AM and 1 mg in afternoon bc of chronic anxiety and it is helpful and tolerated. She can continue temazepam 30 mg nightly.  She tends to have a lot of anxious negative thoughts at night when she is trying to go to bed.  She is trying to reduce the dose.  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  Complaining of HA and history migraine.  Asks for increase imitrex and disc preventatives like propranolol ER Ok increase imitrex to 100 mg prn migraine and start propranolol ER for migraine prevention.  Supportive therapy dealing with some of the recent stressors including fbrother's mania and conflict.  No other med changes today  Follow-up every other week   Lynder Parents, MD, DFAPA  Please see After Visit Summary for patient specific instructions.  Future Appointments  Date Time Provider [place]  01/18/2023 11:00 AM Blanchie Serve, PhD CP-CP None  02/01/2023 11:00 AM Blanchie Serve, PhD CP-CP None  02/22/2023 11:00 AM Blanchie Serve, PhD CP-CP None  03/08/2023 11:00 AM Blanchie Serve, PhD CP-CP None    No orders of the defined types were placed in this encounter.    -------------------------------

## 2023-01-16 NOTE — Progress Notes (Signed)
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 She comes randomly not on a consistent basis due to being unable to have a ride she reports. Her last treatment was February 13th.    She was directed to the treatment room to get vitals taken first. Initial vital signs are B/P at 3:10 PM 103/73, 67, Pt instructed to blow her nose and to recline back at 45 degrees. Pt given first nasal spray (28 mg) administered by pt observed by nurse. Pt always has to go to the restroom after her first dose, at that point she is not too medicated to walk to bathroom safely.There were 5 minutes between each dose, total of 84 mg. Tolerated well. Pt's medication is delivered by [facility] in [place] and stored inside a safe behind a locked door as well. Spravato is a CIII medication and has to be only given at a treatment facility and observed by nurse as pt administered intranasally.  Assessed pt's 40 minute vital signs at 4:05 PM 96/69, 66. Dr. Clovis Pu met with pt to discuss her care at the end of her treatment when her thoughts are clearer and they discussed her medication and moods. She does go to the bathroom at least once during her treatment and again towards the end when she is clearer and able to walk. No sedation and had slight feeling of being "high" she reports. Discharge vitals at 5:00 PM 110/86, 63. Pt stable for discharge.  Pt was observed on site a total of 120 minutes per FDA/REMS requirements. Pt was with nurse for clinical assessment 50 minutes. Pt will call back to schedule.    [id number]   EXP [id number]

## 2023-01-17 ENCOUNTER — Telehealth: Payer: Self-pay | Admitting: Psychiatry

## 2023-01-17 DIAGNOSIS — G43009 Migraine without aura, not intractable, without status migrainosus: Secondary | ICD-10-CM

## 2023-01-17 MED ORDER — SUMATRIPTAN SUCCINATE 100 MG PO TABS: 100.0000 mg | ORAL_TABLET | ORAL | 1 refills | Status: DC | PRN

## 2023-01-17 NOTE — Telephone Encounter (Signed)
Pt is requesting RF of Imitrex to go to: [id number] - [place], [address], [place]

## 2023-01-17 NOTE — Telephone Encounter (Signed)
Sent!

## 2023-01-18 ENCOUNTER — Ambulatory Visit: Payer: 59 | Admitting: Psychiatry

## 2023-01-24 ENCOUNTER — Ambulatory Visit: Payer: 59

## 2023-01-24 ENCOUNTER — Other Ambulatory Visit: Payer: Self-pay

## 2023-01-24 VITALS — BP 116/75 | HR 71

## 2023-01-24 DIAGNOSIS — F339 Major depressive disorder, recurrent, unspecified: Secondary | ICD-10-CM

## 2023-01-24 DIAGNOSIS — F5105 Insomnia due to other mental disorder: Secondary | ICD-10-CM

## 2023-01-24 MED ORDER — TEMAZEPAM 30 MG PO CAPS
ORAL_CAPSULE | ORAL | 2 refills | Status: DC
Start: 2023-01-24 — End: 2023-05-01

## 2023-01-29 NOTE — Progress Notes (Unsigned)
NURSE NOTE:  Pt arrived for her Spravato Treatment, she started Spravato treatments on 10/07/2021, she continues with 84 mg (3 of the 28 mg) Spravato nasal spray for treatment resistance depression. Pt is being treated for Treatment Resistant Depression, Pt taken to treatment room. Pt's medication is charged through [employer].  Medication is stored behind 2 locked doors, it is never given to the pt until time of administration which is observed by the nurse. Disposed of per FDA/REMS regulations. All Spravato Treatments are documented in Spravato REMS per protocol of being a treatment center.    She was directed to the treatment room to get vitals taken first. Initial vital signs are B/P at 3:25 PM 121/76, 67, Pt instructed to blow her nose and to recline back at 45 degrees. Pt given first nasal spray (28 mg) administered by pt observed by nurse. Pt always has to go to the restroom after her first dose, at that point she is not too medicated to walk to bathroom safely.There were 5 minutes between each dose, total of 84 mg. Tolerated well. Pt's medication is delivered by [facility] in [place] and stored inside a safe behind a locked door as well. Spravato is a CIII medication and has to be only given at a treatment facility and observed by nurse as pt administered intranasally.  Assessed pt's 40 minute vital signs at 4:30 PM 117/84, 63. [name] met with pt to discuss her care at the end of her treatment when her thoughts are clearer and they discussed her medication and moods. She does go to the bathroom at least once during her treatment and again towards the end when she is clearer and able to walk. No sedation and had slight feeling of being "high" she reports. Discharge vitals at 5:30 PM 116/75, 71. Pt stable for discharge.  Pt was observed on site a total of 120 minutes per FDA/REMS requirements. Pt was with nurse for clinical assessment 50 minutes. Pt will call back to schedule.     [id number]   EXP [id number]

## 2023-02-01 ENCOUNTER — Ambulatory Visit (INDEPENDENT_AMBULATORY_CARE_PROVIDER_SITE_OTHER): Payer: 59 | Admitting: Psychiatry

## 2023-02-01 DIAGNOSIS — F4010 Social phobia, unspecified: Secondary | ICD-10-CM

## 2023-02-01 DIAGNOSIS — Z636 Dependent relative needing care at home: Secondary | ICD-10-CM

## 2023-02-01 DIAGNOSIS — F339 Major depressive disorder, recurrent, unspecified: Secondary | ICD-10-CM | POA: Diagnosis not present

## 2023-02-01 DIAGNOSIS — G809 Cerebral palsy, unspecified: Secondary | ICD-10-CM

## 2023-02-01 DIAGNOSIS — F422 Mixed obsessional thoughts and acts: Secondary | ICD-10-CM | POA: Diagnosis not present

## 2023-02-01 DIAGNOSIS — Z638 Other specified problems related to primary support group: Secondary | ICD-10-CM
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
## 2023-02-07 ENCOUNTER — Ambulatory Visit: Payer: 59

## 2023-02-07 ENCOUNTER — Encounter: Payer: Self-pay | Admitting: Psychiatry

## 2023-02-07 ENCOUNTER — Ambulatory Visit (INDEPENDENT_AMBULATORY_CARE_PROVIDER_SITE_OTHER): Payer: 59 | Admitting: Psychiatry

## 2023-02-07 VITALS — BP 106/74 | HR 70

## 2023-02-07 DIAGNOSIS — G43009 Migraine without aura, not intractable, without status migrainosus: Secondary | ICD-10-CM | POA: Diagnosis not present

## 2023-02-07 DIAGNOSIS — F4010 Social phobia, unspecified: Secondary | ICD-10-CM

## 2023-02-07 DIAGNOSIS — F422 Mixed obsessional thoughts and acts: Secondary | ICD-10-CM

## 2023-02-07 DIAGNOSIS — F5105 Insomnia due to other mental disorder: Secondary | ICD-10-CM

## 2023-02-07 DIAGNOSIS — F339 Major depressive disorder, recurrent, unspecified: Secondary | ICD-10-CM

## 2023-02-07 NOTE — Progress Notes (Signed)
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 Pt is being treated for Treatment Resistant Depression, Pt taken to treatment room. Pt's medication is charged through [employer].  Medication is stored behind 2 locked doors, it is never given to the pt until time of administration which is observed by the nurse. Disposed of per FDA/REMS regulations. All Spravato Treatments are documented in Spravato REMS per protocol of being a treatment center.    She was directed to the treatment room to get vitals taken first. Initial vital signs are B/P at 3:15 PM 102/78, 73, Pt instructed to blow her nose and to recline back at 45 degrees. Pt given first nasal spray (28 mg) administered by pt observed by nurse. Pt always has to go to the restroom after her first dose, at that point she is not too medicated to walk to bathroom safely.There were 5 minutes between each dose, total of 84 mg. Tolerated well. Pt's medication is delivered by [facility] in [place] and stored inside a safe behind a locked door as well. Spravato is a CIII medication and has to be only given at a treatment facility and observed by nurse as pt administered intranasally.  Assessed pt's 40 minute vital signs at 3:55 PM 104/70, 73. [name] met with pt to discuss her care at the end of her treatment when her thoughts are clearer and they discussed her medication and moods. She does go to the bathroom at least once during her treatment and again towards the end when she is clearer and able to walk. No sedation and had slight feeling of being "high" she reports. Discharge vitals at 5:00 PM 106/74, 71. Pt stable for discharge.  Pt was observed on site a total of 120 minutes per FDA/REMS requirements. Pt was with nurse for clinical assessment 50 minutes. After discussion with [name] pt has  decided to put a hold on her Spravato treatments due to no transportation available to pick her up from her treatments.     [id number]   EXP [id number]

## 2023-02-07 NOTE — Progress Notes (Signed)
Casey Diaz [id number] 13-Nov-1965 57 y.o.    Subjective:   Patient ID:  Casey Diaz is a 57 y.o. (DOB 1966/01/02) female.  Chief Complaint:  Chief Complaint  Patient presents with   Follow-up   Depression   Anxiety   Fatigue     HPI Casey Diaz presents to the office today for follow-up of OCD and severe anxiety.     December 2019 visit the following was noted: No meds were changed. Lives in [name] and back for followup.  Sx are about the same.  Has to take meds with different sizes. Pt reports that mood is Anxious and Depressed and describes anxiety as Severe. Anxiety symptoms include: Excessive Worry, Obsessive Compulsive Symptoms:   Checking,,. Pt reports has interrupted sleep and nocturia. Pt reports that appetite is good. Pt reports that energy is no change and down slightly. Concentration is down slightly. Suicidal thoughts:  denied by patient. Loves the environment of [name] but misses some things there.  She's not able to work there.  H works there and likes it.  Struggled with not working, feels isolated and not up to task of meeting people.  Does attend a church and met a friend who's been helpful.  Leaving for [name] on 10/16/18.   04/09/2020 appointment the following is noted:  Staying another year in [name] bc Covid and other things. Last few months a lot of crying spells.  Is in menopause. Wonders about med changes though is nervous about it.  Crying spells associated with depressing thoughts more than stress or OCD.   Covid really hard on everyone and couldn't see family for 18 mos.  Family still very dysfunctional. No close friends in part due to OCD and depression. Son high Autism spectrum with ADHD and anxiety and she's with him all the time. Greater health problems with CP so more pains.   05/15/20 appt with the following noted: [name] for menopause and helps some. Still depressed.  Chronically. In [place] for 2 more weeks then to [name] for  another year. A lot of stressors lately triggering more checking and anxiety.   OCD is her CC now and seems.  Got worse DT stress.   Stressed with Asberger's son and her health.  H works a lot.  Her FOO still stress. Plan: Trintellix 10 mg 1 tablet in the morning with food and reduce fluvoxamine to 5 tablets nightly for 1 week  then reduce it to 4 tablets nightly.   07/02/20 appt with the following noted: Decided not to get Trintellix bc difficulty getting it. It is available.  There.  Wants to start it now.   Both depression and OCD are severe.  Not suicidal in intent or plan. Did not take samples with her to [name] but will be back in December. covid is worse there and travel is difficult.  Wants to reduce Wellbutrin DT dry mouth. Plan: She's afraid to reduce Luvox at this time DT fear of worsening OCD.  But will consider. Trintellix 10 mg 1 tablet in the morning with food and reduce fluvoxamine to 5 tablets nightly for 1 week  then reduce it to 4 tablets nightly. Also reduce Wellbutrin XL to 300 mg daily.    9-13 2022 appointment with the following noted: Back in [place] since July 14.  Broke arm a month ago and surgery.  It's all been rough adjustment.   B has cancer on his face and M fell taking him to the doctor.  Misses the water and weather of [place].   Cry a lot more since menopause. Still depression and anxiety and OCD.  Asks about ketamine. On Wellbutrin 300, Luvox 300.  No Trintellix. Added Ativan 2 mg AM and HS and it helps.  More likely to get upset at night. Plan: Increase Luvox back to 400 mg daily.  She thinks she's worse on less. Continue Wellbutrin XL to 300 mg daily. 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  She tolerated it well without unusual nausea or vomiting headache or other somatic symptoms.  She did have the expected dissociation which gradually resolved over  the course of the 2-hour period of observation.  She was a little concerned about her balance given her cerebral palsy but has not noted unusual or unexpected problems.  She is motivated to can continue Spravato in hopes of reducing her depressive symptoms. 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  She also has treatment resistant OCD which is partially managed with medications but is still quite disabling.  She is tolerating the medications well.  She is sleeping adequately.  Her appetite is adequate.  She is not having suicidal thoughts.  She continues to wish for a better treatment for OCD that would give her some relief.  09/30/2021 appointment with the following noted: She received her first dose of Spravato 84 mg intranasally today.  She tolerated it well without unusual nausea, vomiting, or other somatic symptoms.  Dissociation as expected did occur and gradually resolved over the 2-hour period of observation.  She did have a mild headache today with the treatment and received ibuprofen 600 mg at her request.  We will follow this to see if it is a pattern Patient is still depressed.  She said she was late with her medicine today and today was a particularly depressing day.  However she notes that the Spravato has lifted her mood considerably even today.  She is hopeful that it will continue to be helpful.  No suicidal thoughts.  She has ongoing chronic anxiety and OCD at baseline.  10/04/21 appt noted: Patient received Spravato 84 mg for the second time today.  She tolerated it well without any unusual headache, nausea or vomiting or other somatic symptoms.  Dissociation did occur and she gradually [place] resolution over the 2-hour period of observation. She did not have any unusual problems after she left the office last Spravato administration.  She did not have any specific problems with balance or walking.  She is at increased risk of that difficulty because of  cerebral palsy.  So far she has not noticed much mood effect from the medication beyond the first day of receiving it.  However she would like to continue Spravato in hopes of getting the antidepressant effect that is desired. Stress dealing with mother's behavior at party pt hosted.  Guilt over it.  10/07/2021 appointment noted: Patient received Spravato 84 mg for the second time today.  She tolerated it well without any unusual headache, nausea or vomiting or other somatic symptoms.  Dissociation did occur and she gradually [place] resolution over the 2-hour period of observation. She still is not sure about the antidepressant effect of Spravato.  Events over the holidays and demands, make it difficult to assess.  She still notes that the OCD tends to worsen the depression and vice versa.  She tolerates the  Spravato well and wants to continue the trial.  10/15/2021 appointment with the following noted: Patient received Spravato 84 mg for the second time today.  She tolerated it well without any unusual headache, nausea or vomiting or other somatic symptoms.  Dissociation did occur and she gradually [place] resolution over the 2-hour period of observation. Patient says it was somewhat difficult to evaluate the effect of the Spravato.  It was scheduled to be twice weekly for 4 weeks consecutively but the holidays have interfered with that administration.  She asked what specifically should be she should be looking for in order to assess improvement.  That was discussed.  The OCD is unchanged and the depression so far is not significantly different.  She still tolerates meds.  There have been no recent med changes  10/19/2021 appt noted: Patient received Spravato 84 mg for the second today.  She tolerated it well without any unusual headache, nausea or vomiting or other somatic symptoms.  Dissociation did occur and she gradually saw resolution over the 2-hour period of observation.   10/21/2021 appointment  noted: Patient received Spravato 84 mg today.  She tolerated it well without any unusual headache, nausea or vomiting or other somatic symptoms.  Dissociation did occur and she gradually saw resolution over the 2-hour period of observation.  She feels better than last week.  She is not as depressed and down.  She is still dealing with grief around the death of her cousin that was unexpected.  It is still difficult to tell how much the Spravato was doing but she is hopeful.  Anxiety is still present with the OCD.  She is not having suicidal thoughts.  She is not hopeless.  She wants to continue treatment.  10/25/2021 appointment with the following noted: Patient received Spravato 84 mg today.  She tolerated it well without any unusual headache, nausea or vomiting or other somatic symptoms.  Dissociation did occur and she gradually saw resolution over the 2-hour period of observation.  She does not typically find the dissociation very strong. She is beginning to think the Spravato is helping somewhat with the depression.  It has been difficult to tell with the holidays intervening as well as the death of her cousin.  She has not been able to get Spravato twice weekly for 4 weeks straight as typically planned.  However she is hopeful.  The OCD remains significant.  She still has a tendency to think very negatively.  She is not suicidal.  10/28/2021 appointment with the following noted: Patient received Spravato 84 mg today.  She tolerated it well without any unusual headache, nausea or vomiting or other somatic symptoms.  Dissociation did occur and she gradually saw resolution over the 2-hour period of observation.  She does not typically find the dissociation very strong. She is feeling more hopeful about the administration of Spravato.  She is having less depression she believes.  Still not dramatically different.  She still has a tendency to have a lot of anxiety and rumination and OCD.  She is not suicidal.   She is eager to continue the Spravato.  11/01/2021 appointment with the following noted: Patient received Spravato 84 mg today.  She tolerated it well without any unusual headache, nausea or vomiting or other somatic symptoms.  Dissociation did occur and she gradually saw resolution over the 2-hour period of observation.  She does not typically find the dissociation very strong. She is continuing to see a little bit of improvement in depression  with Spravato.  The anxiety remains but may be not as severe.  The OCD remains markedly severe chronically.  She is not suicidal.  She is encouraged by the degree of improvement with Spravato and inability to enjoy things more and not be quite as ruminative.  11/04/2021 appt noted: Patient received Spravato 84 mg today.  She tolerated it well without any unusual headache, nausea or vomiting or other somatic symptoms.  Dissociation did occur and she gradually saw resolution over the 2-hour period of observation.  She does not typically find the dissociation very strong. No SE complaints with meds. She continues to feel hopeful about the Spravato.  She has less depression.  Because of a number of factors she is uncertain of the full benefit but thinks she is somewhat less depressed.  Her anxiety and OCD remain significant but a little better.  She is tolerating the medications and does not desire medicine change.  She is not currently complaining of insomnia.   11/08/2021 appointment the following noted: Patient received Spravato 84 mg today.  She tolerated it well without any unusual headache, nausea or vomiting or other somatic symptoms.  Dissociation did occur and she gradually saw resolution over the 2-hour period of observation.  She does not typically find the dissociation very strong. No SE complaints with meds. She feels the Spravato is helping somewhat.  She would like to see a greater effect.  However she is able to enjoy things.  She is productive at home.   She would like to see a lifting of a degree of sadness that remains.  The anxiety and OCD remained largely unchanged.  She wondered about the dosing of Wellbutrin 300 mg a day and Luvox 300 mg a day and possible increases.  She has been at higher doses in the past.  She plans to start water therapy for her weakness and for her shoulder.  11/11/2021 appointment with the following noted: Patient received Spravato 84 mg today.  She tolerated it well without any unusual headache, nausea or vomiting or other somatic symptoms.  Dissociation did occur and she gradually saw resolution over the 2-hour period of observation.  She does not typically find the dissociation very strong. No SE complaints with meds. She feels the Spravato is clearly helping the depression.  She would like to see a more significant effect.  She is still having trouble thinking positive. Her energy is fair.  Concentration is good except for the problem with chronic obsessions. She has been taking Wellbutrin 300 mg in Luvox 300 mg for quite some time but has taken higher doses in the past.  We discussed that.  She would like to try higher doses in order to get a better effect if possible. We just increased the doses a couple of days ago.  No effect yet.  11/15/2021 appointment with the following noted: Patient received Spravato 84 mg today.  She tolerated it well without any unusual headache, nausea or vomiting or other somatic symptoms.  Dissociation did occur and she gradually saw resolution over the 2-hour period of observation.  She does not typically find the dissociation very strong. No SE complaints with meds. The patient is now convinced that the Spravato is helping the depression.  She would like to continue twice weekly Spravato this week if possible.  She has tolerated the increase in Wellbutrin to 450 mg daily and the increase and fluvoxamine to 400 mg daily without complications thus far.  The OCD and anxiety feed  the  depression to some extent. She spends approximately 2 hours daily with checking compulsions due to obsessions about causing harm to others.  For example fearing that when she has hit a pot hole that she may have hit a person and going back to check.  Checking corners and rooms out of fear that she may have harmed someone.  Other various checking compulsions.  She is hoping the increase in fluvoxamine to 400 mg will reduce that over the weeks to come.  She is not seeing a significant difference with the addition of the Spravato though she understands that was not expected.  She is more productive at home and more motivated and able to enjoy things more fully as a result of the Spravato treatment.  She is tolerating the medication  11/18/2021 appointment with the following noted: Patient received Spravato 84 mg today.  She tolerated it well without any unusual headache, nausea or vomiting or other somatic symptoms.  Dissociation did occur and she gradually saw resolution over the 2-hour period of observation.  She does not typically find the dissociation very strong. No SE complaints with meds. She clearly believes the Spravato has been helpful for the depression.  She wonders whether to continue to treatments weekly or to cut back to 1 weekly.  She would like to continue twice weekly in hopes of getting additional improvement in the depression because it is not resolved but it is difficult to get here twice a week in terms of arranging rides. She is recently increased Wellbutrin XL to 450 mg daily and fluvoxamine to 400 mg daily but they have not had time to have an official effect.  She is tolerating that well.  She is tolerating meds overwork overall well. The OCD remains the same as noted on 11/15/2021  11/25/21 appt noted: Patient received Spravato 84 mg today.  She tolerated it well without any unusual headache, nausea or vomiting or other somatic symptoms.  Dissociation did occur and she gradually saw  resolution over the 2-hour period of observation.  She does not typically find the dissociation very strong. No SE complaints with meds. She thinks the increase in Wellbutrin and Luvox have been potentially helpful for depression and OCD respectively.  It has been too early to see the full effect.  She is sleeping and eating well.  She is functioning at home.  She still spends a lot of time that is about 2 hours a day dealing with compulsive behaviors.  12/02/21 appt noted: Patient received Spravato 84 mg today.  She tolerated it well without any unusual headache, nausea or vomiting or other somatic symptoms.  Dissociation did occur and she gradually saw resolution over the 2-hour period of observation.  She does not typically find the dissociation very strong. No SE complaints with meds. Several losses and stressors recently that affect her sense of mood. However still sees significant benefit from the Spravato for her depression.  Wants to continue it. Suspect early  some benefit from the increased Wellbutrin for depression and Luvox for OCD. Tolerating meds. No complaints about the meds. Sleeping and eating well.  No new health concerns.  12/09/21 appt noted: Patient received Spravato 84 mg today.  She tolerated it well without any unusual headache, nausea or vomiting or other somatic symptoms.  Dissociation did occur and she gradually saw resolution over the 2-hour period of observation.  She does not typically find the dissociation very strong. No SE complaints with meds. Seeing noticeable improvement from  increase fluvoxamine to 400 mg daily.  Tolerating meds without concerns over them. Depression is stable with residual sx of easy guilt and easily stressed.  OCD contributes to depression but depression is not severe with less crying spells.  Productive at home with chores.  Enjoyed recent birthday.  Sleeping good. No new concerns.  12/23/2021 appointment noted: Patient received Spravato 84 mg  today.  She tolerated it well without any unusual headache, nausea or vomiting or other somatic symptoms.  Dissociation did occur and she gradually saw resolution over the 2-hour period of observation.  She does not typically find the dissociation very strong. No SE complaints with meds. Seeing noticeable improvement from increase fluvoxamine to 400 mg daily.  Tolerating meds without concerns over them. Her depression is somewhat improved with the Spravato.  She also feels generally a little lighter.  She is more motivated.  She is less overwhelmed by guilt.  The OCD is gradually improving but is still quite time-consuming as noted before.  She is sleeping well.  No side effects  12/30/2021 appointment with the following noted: Patient received Spravato 84 mg today.  She tolerated it well without any unusual headache, nausea or vomiting or other somatic symptoms.  Dissociation did occur and she gradually saw resolution over the 2-hour period of observation.  She does not typically find the dissociation very strong. No SE complaints with meds. Seeing noticeable improvement from increase fluvoxamine to 400 mg daily.  Tolerating meds without concerns over them. She is confident of her the improvement seen with Spravato.  She is less hopeless.  Guilt is marked remarkably improved.  She is not having any thoughts of death or dying.  She is more motivated for activities such as exercise which she is recently started.  She is sleeping well. The OCD remains severe but it is improving somewhat with the increase in fluvoxamine.  It is still consuming a couple hours per day.  01/10/22 apravato 84 admin  01/24/22 appt noted: Patient received Spravato 84 mg today.  She tolerated it well without any unusual headache, nausea or vomiting or other somatic symptoms.  Dissociation did occur and she gradually saw resolution over the 2-hour period of observation.  She does not typically find the dissociation very strong. No  SE complaints with meds. Very tearful today.  Feels like she has been suppressing emotion in the Spravato caused it to be released.  Discussed some stressors.  Overall still feels the medicine is helpful.  She has missed some of the scheduled Spravato treatments that were intended to be weekly due to circumstances beyond her control.  She is still struggling with OCD as previously noted but does believe the medications are helpful. Plan no med changes  01/31/2022 received Spravato 84 mg today  02/09/2022 appointment with the following noted: Patient received Spravato 84 mg today.  She tolerated it well without any unusual headache, nausea or vomiting or other somatic symptoms.  Dissociation did occur and she gradually saw resolution over the 2-hour period of observation.  She does not typically find the dissociation very strong. No SE complaints with meds. Spravato clearly helps depression and OCD but easily gets overwhelmed and tearful with fairly routine stressors.  Tolerating meds. Sleep and appetite is OK Asks to increase lorazepam to 2 mg AM and HS and 67m afternoon  02/16/22 appt noted: Patient received Spravato 84 mg today.  She tolerated it well without any unusual headache, nausea or vomiting or other somatic symptoms.  Dissociation did  occur and she gradually saw resolution over the 2-hour period of observation.  She does not typically find the dissociation very strong. No SE complaints with meds. She has chronic depesssion and OCD but is improved with Spravato, both dx versus before.  She has continued Luvox 400 mg and Wellbutrin 450 mg and is tolerating it.  Chronically easily stressed.  Tolerating all meds.  Doesn't like taking more meds.  Spending a couple hours daily with OCD.  No SI No med changes.  02/21/22 appt noted:   Doesn't like taking more meds.  Spending a couple hours daily with OCD.  No SI No med changes.  02/21/22 appt noted: Patient received Spravato 84 mg today.  She  tolerated it well without any unusual headache, nausea or vomiting or other somatic symptoms.  Dissociation did occur and she gradually saw resolution over the 2-hour period of observation.  She does not typically find the dissociation very strong. No SE complaints with meds. She has chronic depesssion and OCD but is improved with Spravato, both dx versus before.  She has continued Luvox 400 mg and Wellbutrin 450 mg and is tolerating it.  Chronically easily overwhelmed and doesn't know why.  Tolerating all meds. Wants to continue meds.  03/16/22 appt noted: Patient received Spravato 84 mg today.  She tolerated it well without any unusual headache, nausea or vomiting or other somatic symptoms.  Dissociation did occur and she gradually saw resolution over the 2-hour period of observation.  She does not typically find the dissociation very strong. No SE complaints with meds. Overall she still feels the Spravato has been helpful not only for her depression but also for her OCD which was somewhat unexpected.  OCD is still significant but it is less severe than prior to starting Spravato.  She is tolerating Luvox 400 mg and Wellbutrin 450 mg.  We discussed possible med adjustments.  03/23/22 appt noted: Patient received Spravato 84 mg today.  She tolerated it well without any unusual headache, nausea or vomiting or other somatic symptoms.  Dissociation did occur and she gradually saw resolution over the 2-hour period of observation.  She does not typically find the dissociation very strong. No SE complaints with meds. She is still depressed and still has OCD of course but is improved with the Spravato.  She is tolerating the medications well.  We had previously discussed the possibility of switching some of the Wellbutrin to [name] and she is very interested in that in hopes of further improvement in depression and OCD.  She understands that Auvelity is not used for OCD on the label.  She is tolerating the  medications.  She is still easily overwhelmed.  She is sleeping and eating okay.. Plan: Reduce Wellbutrin XL to 300 mg AM and add Auvelity 1 tablet each AM  03/30/22 appt noted: Patient received Spravato 84 mg today.  She tolerated it well without any unusual headache, nausea or vomiting or other somatic symptoms.  Dissociation did occur and she gradually saw resolution over the 2-hour period of observation.  She does not typically find the dissociation very strong. No SE complaints with meds. She is still depressed and still has OCD of course but is improved with the Spravato.  She is tolerating the medications well.  No difference with Auvelity 1 AM so far and no SE.  Going on vacation on Saturday. 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 Sleep and appetite good. Plan: Increase Auvelity to 1 twice daily and reduce Wellbutrin  to XL 150 every morning  04/14/2022 appointment with the following noted: Patient received Spravato 84 mg today.  She tolerated it well without any unusual headache, nausea or vomiting or other somatic symptoms.  Dissociation did occur and she gradually saw resolution over the 2-hour period of observation.  She does not typically find the dissociation very strong. No SE complaints with meds. She is still depressed and still has OCD of course but is improved with the Spravato.  She is tolerating the medications well.  Just increased Auvelity to BID yesterday and reduced Wellbutrin to 150 AM. No SE so far.  No change in mood or anxiety so far.  Chronic OCD as noted and residual depression and chronic fatigue.  04/21/2022 appointment with the following noted: Patient received Spravato 84 mg today.  She tolerated it well without any unusual headache, nausea or vomiting or other somatic symptoms.  Dissociation did occur and she gradually saw resolution over the 2-hour period of observation.  She does not typically find the dissociation very strong. No SE complaints with  meds. She is still depressed and still has OCD of course but is improved with the Spravato.   She has questions about the dosing of lorazepam. She tends to have negative anxious thoughts at night.  This tends to interfere with her ability to go to sleep.  She is getting about 8 to 9 hours of sleep.  She is tolerating the meds without excessive sedation and does not nap during the day.  05/06/22 appt noted: Patient received Spravato 84 mg today.  She tolerated it well without any unusual headache, nausea or vomiting or other somatic symptoms.  Dissociation did occur and she gradually saw resolution over the 2-hour period of observation.  She does not typically find the dissociation very strong. No SE complaints with meds. She is still depressed and still has OCD of course but is improved with the Spravato.   Had some questions about timing of dosing of fluvoxamine and Auvelity. OCD is not quite as time consuming.  Sleep and eating are the same.   No SE meds.  05/25/22 appt noted: Patient received Spravato 84 mg today.  She tolerated it well without any unusual headache, nausea or vomiting or other somatic symptoms.  Dissociation did occur and she gradually saw resolution over the 2-hour period of observation.  She does not typically find the dissociation very strong. No SE complaints with meds. She is still depressed and still has OCD of course but is improved with the Spravato.   She has less OCD when away from home and on vacation of note. Plan: Rec gradually reduce HS lorazepam to 1 mg Hs.  Can continue lorazepam 2 mg AM and 1 mg in afternoon bc of chronic anxiety and it is helpful and tolerated. She can continue temazepam 30 mg nightly.  She tends to have a lot of anxious negative thoughts at night when she is trying to go to bed  06/16/22 appt noted: Patient received Spravato 84 mg today.  She tolerated it well without any unusual headache, nausea or vomiting or other somatic symptoms.   Dissociation did occur and she gradually saw resolution over the 2-hour period of observation.  She does not typically find the dissociation very strong. No SE complaints with meds. She is still depressed and still has OCD of course but is improved with the Spravato.   She has less OCD when away from home and on vacation of note. She is tolerating  the medications.  She has continued current medications. 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  She still receives benefit from [place].  Her depression and OCD are better with the Spravato.  She thinks she is getting additional benefit with the switch from Wellbutrin to [place].  07/04/2022 appointment noted: Reports she developed a rash on her face from [facility] and feels like she is allergic to it.  She stopped it and went back to Wellbutrin 450 mg every morning.  The rash has cleared up.  She did not require any medical attention and did not have shortness of breath. Overall her depression and OCD are about the same as they have been.  She did not notice a substantial difference from the brief treatment with Auvelity but she understands she did not take a full course.  She is tolerating the current medicines well. Current meds fluvoxamine 400 mg daily, Wellbutrin XL 450 mg daily, lorazepam 1 to 2 mg in the morning and 1 to 2 mg at night and 1 mg in the afternoon, temazepam 30 mg nightly. She wants to continue the Spravato because she feels it has been helpful for both her depression and her racing OCD  07/18/22 appt noted: Patient received Spravato 84 mg today.  She tolerated it well without any unusual headache, nausea or vomiting or other somatic symptoms.   Dissociation did occur and she gradually saw resolution over the 2-hour period of observation.  She does not typically find the dissociation very strong. No SE complaints with meds. She is still depressed and still has OCD of course but is improved with the Spravato.  Rash better off Auvelity and back on Welllbutrin XL 450 mg AM, fluvoxamine 400 mg daily.  08/15/22 appt noted: Current psych meds: Wellbutrin XL 450 mg AM, fluvoxamine 100 mg in AM and 300 mg HS, lorazepam 1 mg 1-2 mg in the AM and HS and 1 tablet prn midday for anxiety, temazepam 30 mg HS Patient received Spravato 84 mg today.  She tolerated it well without any unusual headache, nausea or vomiting or other somatic symptoms.  Dissociation did occur and she gradually saw resolution over the 2-hour period of observation.  She does not typically find the dissociation very strong. No SE complaints with meds. She has a great deal of stress dealing with her family.  Disc brother's ongoing mania and difficulty getting him help and the stress he causes for the family. She wants to continue Spravato through this very stressful holdicay season and reevaluate the frequency after the New Year.  09/12/22 appt noted: Current psych meds: Wellbutrin XL 450 mg AM, fluvoxamine 100 mg in AM and 300 mg HS, lorazepam 1 mg 1-2 mg in the AM and HS and 1 tablet prn midday for anxiety, temazepam 30 mg HS Patient received Spravato 84 mg today.  She tolerated it well without any unusual headache, nausea or vomiting or other somatic symptoms.  Dissociation did occur and she gradually saw resolution over the 2-hour period of observation.  She does not typically find the dissociation very strong. No SE complaints with meds. She has a great deal of stress dealing with her family.  Disc brother's  ongoing mania and difficulty getting him help and the stress he causes for the family. She wants to continue Spravato through this very stressful holdicay season and  reevaluate the frequency after the New Year.  Chronically easily overwhelmed with family. Complaining of HA and history migraine.  Asks for increase imitrex and disc preventatives like propranolol ER  09/26/22 appt noted: Current psych meds: Wellbutrin XL 450 mg AM, fluvoxamine 100 mg in AM and 300 mg HS, lorazepam 1 mg 1-2 mg in the AM and HS and 1 tablet prn midday for anxiety, temazepam 30 mg HS Patient received Spravato 84 mg today.  She tolerated it well without any unusual headache, nausea or vomiting or other somatic symptoms.  Dissociation did occur and she gradually saw resolution over the 2-hour period of observation.  She does not typically find the dissociation very strong. No SE complaints with meds. She has a great deal of stress dealing with her family.  This is ongoing The holidays are much more stressful DT family problems.  She is noting OCD is much worse over the last couple of week.  Depression is better with Spravato. Needed higher dose meds for migraine.   10/03/22 appt noted: Current psych meds: Wellbutrin XL 450 mg AM, fluvoxamine 100 mg in AM and 300 mg HS, lorazepam 1 mg 1-2 mg in the AM and HS and 1 tablet prn midday for anxiety, temazepam 30 mg HS Patient received Spravato 84 mg today.  She tolerated it well without any unusual headache, nausea or vomiting or other somatic symptoms.  Dissociation did occur and she gradually saw resolution over the 2-hour period of observation.  She does not typically find the dissociation very strong. No SE complaints with meds. She has now realized that the rash she previously previously attributed to [facility] was not related.  She is interested may be retrying that after the holidays.  She is tolerating medications otherwise. The holidays remain chronically stressful to her due to family dynamic problems which cause her to consistently feel stuck.  Under more stress her OCD is worse.  She will have a tendency to have crying spells.  The  depression and OCD are still improved with Spravato as compared to before.  11/15/22 appt noted: Current psych meds: Wellbutrin XL 450 mg AM, fluvoxamine 100 mg in AM and 300 mg HS, lorazepam 1 mg 1-2 mg in the AM and HS and 1 tablet prn midday for anxiety, temazepam 30 mg HS Patient received Spravato 84 mg today.  She tolerated it well without any unusual headache, nausea or vomiting or other somatic symptoms.  Dissociation did occur and she gradually saw resolution over the 2-hour period of observation.  She does not typically find the dissociation very strong. No SE complaints with meds. Continues to feel depressed and overwhelmed by family problems including her brother's mania and recent eviction and commitment.  Chronic OCD worse when stressed.  No SI.  Tolerating meds. Plan: Per her request continue Wellbutrin XL 450 mg every morning. She has come to the realization that the rash she had previously attributed to [facility] was not related.  She is interested in perhaps retrying Auvelity. There are few alternative medication options that remain.  01/11/23 appt noted: Current psych meds: Wellbutrin XL 150 mg BID and started Auvelity 1 AM, fluvoxamine 100 mg in AM and 300 mg HS, lorazepam 1 mg 1-2 mg in the AM and HS and 1 tablet prn midday for anxiety, temazepam 30 mg HS Patient received  Spravato 84 mg today.  She tolerated it well without any unusual headache, nausea or vomiting or other somatic symptoms.  Dissociation did occur and she gradually saw resolution over the 2-hour period of observation.  She does not typically find the dissociation very strong. No SE complaints with meds. Trouble getting to sessions lately DT transportation problems.   Continues to feel depressed and overwhelmed by OCD and family.  Feels she needs toevery other week Spravato bc it helps for a couple of weeks and then seems to wear off.  Struggleing with OCD and depression both of which are eased by Spravato. Less  crying with Auvelity.  02/07/23 appt noted: Current psych meds: Wellbutrin XL 150 mg BID and started Auvelity 1 AM, fluvoxamine 100 mg in AM and 300 mg HS, lorazepam 1 mg 1-2 mg in the AM and HS and 1 tablet prn midday for anxiety, temazepam 30 mg HS Patient received Spravato 84 mg today.  She tolerated it well without any unusual headache, nausea or vomiting or other somatic symptoms.  Dissociation did occur and she gradually saw resolution over the 2-hour period of observation.  She does not typically find the dissociation very strong. No SE complaints with meds. Trouble getting to sessions lately DT transportation problems.  This is a problem ongoing and thinks she might need to pause Spravato bc won't be able to get her for at least 3 weeks. She is holding pretty steady with a moderate level of anxiety and depression ongoing and chronic.    Previous psych med trials include Prozac, paroxetine, sertraline, fluvoxamine, venlafaxine, Anafranil with no response,  Wellbutrin, , Viibryd, Trintellix 10 1 month NR Auvelity BID  Geodon,  risperidone, Rexulti, Abilify,  Seroquel, Latuda 40 mg with irritability.  lamotrigine lithium,  BuSpar, Namenda,  pramipexole with no response, and Topamax, pindolol  ECT-MADRS    Flowsheet Row Office Visit from 06/29/2021 in [facility]  MADRS Total Score 36      Flowsheet Row Admission (Discharged) from 06/11/2021 in [place] [facility]  C-SSRS RISK CATEGORY No Risk        Review of Systems:  Review of Systems  Constitutional:  Positive for fatigue.  Cardiovascular:  Negative for chest pain and palpitations.  Musculoskeletal:  Positive for arthralgias, back pain, gait problem and neck pain. Negative for joint swelling.  Neurological:  Positive for weakness and headaches. Negative for tremors.  Psychiatric/Behavioral:  Positive for dysphoric mood. Negative for suicidal ideas. The patient is nervous/anxious.      Medications: I have reviewed the patient's current medications.  Current Outpatient Medications  Medication Sig Dispense Refill   Abaloparatide (TYMLOS) 3120 MCG/1.56ML SOPN Inject into the skin.     Azelastine-Fluticasone 137-50 MCG/ACT SUSP Place 1-2 sprays into both nostrils daily.     baclofen (LIORESAL) 10 MG tablet Take 20 mg by mouth at bedtime as needed for muscle spasms.     buPROPion (WELLBUTRIN XL) 150 MG 24 hr tablet TAKE 3 TABLETS BY MOUTH DAILY 270 tablet 1   dicyclomine (BENTYL) 10 MG capsule Take 10 mg by mouth daily.     docusate sodium (COLACE) 100 MG capsule Take 1 capsule (100 mg total) by mouth 2 (two) times daily. (Patient taking differently: Take 100 mg by mouth daily.) 10 capsule 0   Esketamine HCl, 84 MG Dose, (SPRAVATO, 84 MG DOSE,) 28 MG/DEVICE SOPK USE 3 SPRAYS IN EACH NOSTRIL EVERY OTHER WEEK 3 each 0   fexofenadine (ALLEGRA) 180 MG tablet  Take 180 mg by mouth daily.     fluvoxaMINE (LUVOX) 100 MG tablet 1 tablet in the AM and 3 tablets at night 360 tablet 0   hydrocortisone (ANUSOL-HC) 2.5 % rectal cream Place rectally 2 (two) times daily. x 7-14 days 30 g 0   ketotifen (ZADITOR) 0.025 % ophthalmic solution Place 3 drops into both eyes at bedtime.     LORazepam (ATIVAN) 1 MG tablet TAKE 1-2 IN THE AM AND 1-2 TABLETS EVERY NIGHT AT BEDTIME AND 1 TABLET IN AFTERNOON WHEN NEEDED FOR ANXIETY AND SLEEP 150 tablet 1   magnesium gluconate (MAGONATE) 500 MG tablet Take 500 mg by mouth daily.     MIBELAS 24 FE 1-20 MG-MCG(24) CHEW Chew 1 tablet by mouth at bedtime as needed (bowel regularity).     Multiple Vitamins-Minerals (ADULT GUMMY PO) Take 2 tablets by mouth in the morning.     nitrofurantoin (MACRODANTIN) 100 MG capsule Take 100 mg by mouth as needed (For urinary tract infection.).      oxyCODONE-acetaminophen (PERCOCET/ROXICET) 5-325 MG tablet Take 1-2 tablets by mouth every 6 (six) hours as needed for severe pain. 50 tablet 0   polyethylene glycol (MIRALAX /  GLYCOLAX) packet Take 17 g by mouth daily as needed for mild constipation. 14 each 0   propranolol ER (INDERAL LA) 60 MG 24 hr capsule TAKE 1 CAPSULE BY MOUTH EVERY DAY 90 capsule 1   psyllium (METAMUCIL) 58.6 % powder Take 1 packet by mouth daily as needed (constipation).     SUMAtriptan (IMITREX) 100 MG tablet Take 1 tablet (100 mg total) by mouth every 2 (two) hours as needed for migraine. May repeat in 2 hours if headache persists or recurs. 10 tablet 1   temazepam (RESTORIL) 30 MG capsule Take 1 capsule by mouth at bedtime as needed for sleep 30 capsule 2   Vitamin D-Vitamin K (VITAMIN K2-VITAMIN D3 PO) Take 1-2 sprays by mouth daily.     No current facility-administered medications for this visit.    Medication Side Effects: None   Allergies:  Allergies  Allergen Reactions   Hydrocodone Itching   Sulfamethoxazole-Trimethoprim Itching   Dust Mite Extract Other (See Comments)    Sneezing, watery eyes, runny nose   Latex Itching   Other Other (See Comments)    PT IS ALLERGIC TO CAT DANDER AND RAGWEED - Sneezing, watery eyes, runny nose    Pollen Extract Other (See Comments)    Sneezing, watery eyes, runny nose     Past Medical History:  Diagnosis Date   Abnormal Pap smear 2011   hpv/mild dysplasia,cin1   Anxiety    Cerebral palsy    right arm/leg   Cystocele    Depression    Headache    Neuromuscular disorder    Cerebral Palsy   OCD (obsessive compulsive disorder)    Osteoporosis    Uterine prolaps     Family History  Problem Relation Age of Onset   Cancer Father        skin AND LUNG   Alcohol abuse Sister        CRACK COCAINE    Social History   Socioeconomic History   Marital status: Married    Spouse name: Not on file   Number of children: Not on file   Years of education: Not on file   Highest education level: Not on file  Occupational History   Not on file  Tobacco Use   Smoking status: Never   Smokeless tobacco:  Never  Substance and Sexual  Activity   Alcohol use: Not Currently    Comment: OCCASIONAL beer   Drug use: No   Sexual activity: Yes    Birth control/protection: Pill    Comment: LOESTRIN 24 FE  Other Topics Concern   Not on file  Social History Narrative   Not on file   Social Determinants of Health   Financial Resource Strain: Not on file  Food Insecurity: Not on file  Transportation Needs: Not on file  Physical Activity: Not on file  Stress: Not on file  Social Connections: Not on file  Intimate Partner Violence: Not on file    Past Medical History, Surgical history, Social history, and Family history were reviewed and updated as appropriate.   Please see review of systems for further details on the patient's review from today.   Objective:   Physical Exam:  LMP  (LMP Unknown)   Physical Exam Constitutional:      General: She is not in acute distress. Neurological:     Mental Status: She is alert and oriented to person, place, and time.     Cranial Nerves: No dysarthria.     Motor: Weakness present.     Gait: Gait abnormal.  Psychiatric:        Attention and Perception: Attention and perception normal.        Mood and Affect: Mood is anxious and depressed. Affect is not labile or tearful.        Speech: Speech normal.        Behavior: Behavior normal. Behavior is cooperative.        Thought Content: Thought content normal. Thought content is not delusional. Thought content does not include homicidal or suicidal ideation. Thought content does not include suicidal plan.        Cognition and Memory: Cognition and memory normal. Cognition is not impaired.        Judgment: Judgment normal.     Comments: Insight intact Ongoing OCD remains fairly severe but less anxious Checking compulsions up to 2 hours daily but improved noticeably Chronic depression persistent but better with Spravato  chronic family problems recently worse.      Lab Review:     Component Value Date/Time   NA 138  06/11/2021 0606   K 4.0 06/11/2021 0606   CL 107 06/11/2021 0606   CO2 26 06/11/2021 0606   GLUCOSE 90 06/11/2021 0606   BUN 18 06/11/2021 0606   CREATININE 0.81 06/11/2021 0606   CALCIUM 9.4 06/11/2021 0606   PROT 6.5 06/11/2021 0606   ALBUMIN 3.3 (L) 06/11/2021 0606   AST 17 06/11/2021 0606   ALT 14 06/11/2021 0606   ALKPHOS 141 (H) 06/11/2021 0606   BILITOT 0.2 (L) 06/11/2021 0606   GFRNONAA >60 06/11/2021 0606   GFRAA >60 07/09/2016 0438       Component Value Date/Time   WBC 5.8 06/11/2021 0606   RBC 4.12 06/11/2021 0606   HGB 12.5 06/11/2021 0606   HCT 39.7 06/11/2021 0606   PLT 299 06/11/2021 0606   MCV 96.4 06/11/2021 0606   MCH 30.3 06/11/2021 0606   MCHC 31.5 06/11/2021 0606   RDW 13.9 06/11/2021 0606   LYMPHSABS 1.9 06/11/2021 0606   MONOABS 0.5 06/11/2021 0606   EOSABS 0.1 06/11/2021 0606   BASOSABS 0.0 06/11/2021 0606    No results found for: "POCLITH", "LITHIUM"   No results found for: "PHENYTOIN", "PHENOBARB", "VALPROATE", "CBMZ"   .res Assessment: Plan:  Casey Diaz was seen today for follow-up, depression, anxiety and fatigue.  Diagnoses and all orders for this visit:  Recurrent major depression resistant to treatment  Mixed obsessional thoughts and acts  Migraine without aura and without status migrainosus, not intractable  Social anxiety disorder  Insomnia due to mental condition    Both Dx are TR and marked.  Impaired function but less so with Spravato re: depression..   She is receiving Spravato 84 mg weekly and marked improvement in the depression..  she feels it also helps OCD somewhat.  However still easily overwhelmed with low stress tolerance.  The OCD is improved with the increase in fluvoxamine and with Spravato.  Spends 2 hours daily and checking compulsions on her worst days but better when she travels.  She has been on higher doses of fluvoxamine above the usual max of 400 mg daily in the past.    Disc SE. She would like to  continue Luvox 400 mg daily again    She is tolerating the meds well  Continue  Luvox back to 400 mg nightly as of January 2023. Disc dosing higher than usual.  She feels this is increase has helped more with OCD which remains chronically severe.  Contiinue Auvelity 1 AM and Wellbutrin XL 150 BID.  Option increase Auvelity BID and reduce Wellbutrin to 1 AM. There are few other alternative medication options that remain.    Disc Spravato DT TRD incl details and SE. Disc dosing and duration.  Pt with severe depression MADRS 36 on 06/29/21  Patient was administered Spravato 84 mg intranasally dosage today.  The patient experienced the typical dissociation which gradually resolved over the 2-hour period of observation.  There were no complications.  Specifically the patient did not have nausea or vomiting or headache.  Blood pressures remained within normal ranges at the 40-minute and 2-hour follow-up intervals.  By the time the 2-hour observation period was met the patient was alert and oriented and able to exit without assistance. She tends to have lingering sedative effects but not severe. .  See nursing note for further details. Hold Spravato until can resume more regularly.  We discussed the short-term risks associated with benzodiazepines including sedation and increased fall risk among others.  Discussed long-term side effect risk including dependence, potential withdrawal symptoms, and the potential eventual dose-related risk of dementia.  But recent studies from 2020 dispute this association between benzodiazepines and dementia risk. Newer studies in 2020 do not support an association with dementia. Disc this is high dose and not ideal.  Also disc risk combining it with temazepam. Rec gradually reduce HS lorazepam to 1 mg Hs.  Can continue lorazepam 2 mg AM and 1 mg in afternoon bc of chronic anxiety and it is helpful and tolerated. She can continue temazepam 30 mg nightly.  She tends to have  a lot of anxious negative thoughts at night when she is trying to go to bed.  She is trying to reduce the dose.  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  Complaining of HA and history migraine.  Asks for increase imitrex and disc preventatives like propranolol ER Ok increase imitrex to 100 mg prn migraine and start propranolol ER for migraine prevention.  Supportive therapy dealing with some of the recent stressors including fbrother's mania and conflict.  No other med changes today  Follow-up every other week   Meredith Staggers, MD, DFAPA  Please see After Visit Summary for patient specific  instructions.  Future Appointments  Date Time Provider Department Center  02/22/2023 11:00 AM Robley Fries, PhD [facility] None  03/08/2023 11:00 AM Robley Fries, PhD [facility] None    No orders of the defined types were placed in this encounter.    -------------------------------

## 2023-02-22 ENCOUNTER — Ambulatory Visit (INDEPENDENT_AMBULATORY_CARE_PROVIDER_SITE_OTHER): Payer: 59 | Admitting: Psychiatry

## 2023-02-22 DIAGNOSIS — F422 Mixed obsessional thoughts and acts: Secondary | ICD-10-CM

## 2023-02-22 DIAGNOSIS — F4010 Social phobia, unspecified: Secondary | ICD-10-CM

## 2023-02-22 DIAGNOSIS — Z636 Dependent relative needing care at home: Secondary | ICD-10-CM | POA: Diagnosis not present

## 2023-02-22 DIAGNOSIS — Z638 Other specified problems related to primary support group: Secondary | ICD-10-CM

## 2023-02-22 DIAGNOSIS — G809 Cerebral palsy, unspecified: Secondary | ICD-10-CM

## 2023-02-22 DIAGNOSIS — F339 Major depressive disorder, recurrent, unspecified: Secondary | ICD-10-CM | POA: Diagnosis not present

## 2023-02-22 NOTE — Progress Notes (Signed)
Psychotherapy Progress Note [facility] [name], PhD LP  Patient ID: Casey Diaz (Casey "[name]")    MRN: [id number] Therapy format: Individual psychotherapy Date: 02/22/2023      Start: 11:14a     Stop: 12:01p     Time Spent: 47 min Location: In-person   Session narrative (presenting needs, interim history, self-report of stressors and symptoms, applications of prior therapy, status changes, and interventions made in session) Have camps set up this summer for Casey Diaz.  Wound up just telling him it's going to happen, and balking was not much.  Big bother is her mother, who violated the restraining order on Paul by picking him up and getting him a meal -- at her house -- and she doesn't understand how it's enabling him and mixing the message of the restraining order, and why the other kids would be irritated with her.  C/o brother Ed not inviting their M to his daughter Anda Kraft' college graduation, and not sending out notices of graduation, as if family don't count.  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  Support/empathy provided, and encouraged to do what she does, however unequal, just because she cares to and try not to get caught up in impossible standards of fairness.  Therapeutic modalities: Cognitive Behavioral Therapy, Solution-Oriented/Positive Psychology, and Ego-Supportive  Mental Status/Observations:  Appearance:   Casual     Behavior:  Appropriate  Motor:  Normal and exc CP  Speech/Language:   Clear and Coherent  Affect:  Appropriate  Mood:  dysthymic  Thought process:  normal  Thought content:    Obsessions  Sensory/Perceptual disturbances:    WNL  Orientation:  Fully oriented  Attention:  Good    Concentration:  Fair  Memory:  WNL  Insight:    Good   Judgment:   Good  Impulse Control:  Fair   Risk Assessment: Danger to Self: No Self-injurious Behavior: No Danger to Others: No Physical Aggression / Violence: No Duty to Warn: No Access to Firearms a concern: No  Assessment of progress:  stabilized  Diagnosis:   ICD-10-CM   1. 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 Mixed obsessional thoughts and acts  F42.2     3. Social anxiety disorder  F40.10     4. Caregiver stress  Z63.6     5. 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 Relationship problem with family members  Z63.8      Plan:  Family of origin concerns -- Re. [name], encourage in treatment, but do not overstep trying to reach his psychiatrist.  OK to make statement providing concerns but be sure not to badger.  Prepare to swear out a petition if he shows a frank danger to self or others, and do stick to behavioral limits, offer if sensible to connect him with what family believe will help.  Continue being willing to decline services or interactions where needed and require his effort (or honesty, acknowledgment) first.  Re. mother,  try best to keep level and educate her where needed.  Re. Ed, prioritize asking over resenting.  With all, obtain a yes to have a difficult conversation before going into it and try not to frontload extra request --  pursue one assertiveness issue at a time. Family assistance, risk of perceived codependency -- Self-affirm that wanting to help is not dysfunctional, all calls are judgment calls.  Option Al-Anon for support and boundary help.  Endorse connecting [place] to further help, either through [facility] or [facility], and recruiting family members into necessary confrontation. Relationship with H -- Open to join tx.  Consider addressing H's overinterpretations of "choosing" FOO over FOI/him and perceived negativity toward [place].  Consider further willingness to challenge sexual habits on grounds of feeling left out and/or  objectified, and ask for H to work out sexual expectations together rather than simply accede to his perceived demands and in the process help mislead him.  Overall, take care to approach one issue at a time with him, too. Parenting -- Continue appropriate efforts to shape [name]'s socializing and responsibility to clean up after himself, e.g., "after you ___, then you can ___".  Re. perceived loss of close relationship, self-affirm that she always wanted to create a "buddy" but any adolescent boy still distances and may go through a sullen, isolative period.  Other options for therapy for him.  Endorse summer camp as planned, see tips for communicating and working with resistance. Depressive thinking -- Generally, look for thought patterns of shaming self irrationally, and collecting troubles to the point of desperation, and dispute.  Both are distress-making and interfere with interpersonal effectiveness.  At the same time, guard against collecting offenses to the point of resentment, since portraying resentment will only get in the way of getting priorities listened to by others. Intrusive thoughts and checking compulsions -- Practice ad lib pressing on without checking corners and spaces for imaginary abused children.  Same with driving and return checking to see if she hit someone.  Practice trust and move on, and as needed self-remind that these ideas come up because she has been a victim before, she naively perpetrated once in adolescence, and OCD picks up whatever you feel is most important to create false guilt. Self-care -- Continue efforts to engage exercise, part-time work, and supportive relationship outside the home.  Continue to grow in reasonably representing her physical limitations and needs without self-shaming Other recommendations/advice as may be noted above Continue to utilize previously learned skills ad lib Maintain medication as prescribed and work faithfully with relevant  prescriber(s) if any changes are desired or seem indicated Call the clinic on-call service, 988/hotline, 911, or present to [facility] or ER if any life-threatening psychiatric crisis Return for time as already scheduled. Already scheduled visit in this office 03/08/2023.  Robley Fries, PhD [name], PhD LP Clinical Psychologist, [facility] [address] [place], [place] [address] [phone]

## 2023-02-24 ENCOUNTER — Other Ambulatory Visit: Payer: Self-pay | Admitting: Psychiatry

## 2023-02-24 DIAGNOSIS — F4010 Social phobia, unspecified: Secondary | ICD-10-CM

## 2023-02-27 ENCOUNTER — Telehealth: Payer: Self-pay | Admitting: Psychiatry

## 2023-02-27 NOTE — Telephone Encounter (Signed)
Pt called and would like to get back on the spravato schedule. Please call her at [phone]

## 2023-02-27 NOTE — Telephone Encounter (Signed)
Noted. I will give her a call.

## 2023-02-28 NOTE — Telephone Encounter (Signed)
We have both tried reaching each other a couple times, I left her another message.

## 2023-03-01 NOTE — Telephone Encounter (Signed)
Pt is scheduled for June 3rd. For Spravato

## 2023-03-08 ENCOUNTER — Ambulatory Visit (INDEPENDENT_AMBULATORY_CARE_PROVIDER_SITE_OTHER): Payer: Managed Care, Other (non HMO) | Admitting: Psychiatry

## 2023-03-08 DIAGNOSIS — F422 Mixed obsessional thoughts and acts: Secondary | ICD-10-CM

## 2023-03-08 DIAGNOSIS — F339 Major depressive disorder, recurrent, unspecified: Secondary | ICD-10-CM | POA: Diagnosis not present

## 2023-03-08 DIAGNOSIS — F4010 Social phobia, unspecified: Secondary | ICD-10-CM | POA: Diagnosis not present

## 2023-03-08 DIAGNOSIS — Z636 Dependent relative needing care at home: Secondary | ICD-10-CM

## 2023-03-08 DIAGNOSIS — G809 Cerebral palsy, unspecified: Secondary | ICD-10-CM

## 2023-03-08 DIAGNOSIS — Z638 Other specified problems related to primary support group: Secondary | ICD-10-CM

## 2023-03-08 NOTE — Progress Notes (Signed)
Psychotherapy Progress Note [facility] [name], PhD LP  Patient ID: Casey Diaz (Casey "Casey Diaz")    MRN: [id number] Therapy format: Individual psychotherapy Date: 03/08/2023      Start: 11:14a     Stop: 12:01p     Time Spent: 47 min Location: In-person   Session narrative (presenting needs, interim history, self-report of stressors and symptoms, applications of prior therapy, status changes, and interventions made in session) Continues to struggle with requirements to use a computer portal, as left hand is not useful for typing.  Endorsed asking accommodations if she wishes.  Mother's Day worked out well.  Friends took Casey Diaz to [employer], family dinner pulled off, with Casey Diaz and Casey Diaz.  It was still offputting to not get an invitation to niece's graduation at [employer] with Casey Diaz and brother continue.  Knows Casey Diaz got laid off his latest job and not looking.    Figures to resume Spravato in June.  Faults herself for being disorganized, e.g., misplacing medication yesterday and needing an hour and a half to search it out, stressing over how it's a controlled substance and she would be 2 weeks out of luck if she didn't find it.  Turns out she keeps her meds in a bag in the closet and takes her doses out of the bottles daily.  Agreed wise to use a pillbox.  Made a friend a couple months back, starting to suspect she's bipolar (odd suspicions, paranoid ideas of her ex-husband messing with her).  Sympathetic, as she was abandoned by her physician husband, and able to turn down inconvenient or unwanted contact, but part of a larger pattern of social isolation and lack of friendships.  Casey Diaz who died over a year ago was a loss.  Does fel she would like to move closer to water, and away from the tantalizing prospect of being 10 minutes from a brother who doesn't keep touch.  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   Feeling some alienation after the pharmacy coordinator had brittle response to her bringing up a medication error for Casey Diaz (who is treated there), and demonized her as lazy to the boss (the psychiatrist).    Therapeutic modalities: Cognitive Behavioral Therapy, Solution-Oriented/Positive Psychology, and Ego-Supportive  Mental Status/Observations:  Appearance:   Casual     Behavior:  Appropriate  Motor:  Normal and exc CP  Speech/Language:   Clear and Coherent  Affect:  Appropriate  Mood:  Lighter, still dysthymic  Thought process:  normal  Thought content:    Obsessions  Sensory/Perceptual disturbances:    WNL  Orientation:  Fully oriented  Attention:  Good    Concentration:  Fair  Memory:  WNL  Insight:    Good  Judgment:   Good  Impulse Control:  Good   Risk Assessment: Danger to Self: No Self-injurious Behavior: No Danger to Others: No Physical Aggression / Violence: No Duty to Warn: No Access to Firearms a concern: No  Assessment of progress:  progressing  Diagnosis:   ICD-10-CM   1. 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 Mixed obsessional thoughts and acts  F42.2     3. Social anxiety disorder  F40.10     4. Caregiver stress  Z63.6     5. 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 Relationship problem with family members  Z63.8      Plan:  Family of origin concerns -- Re. Casey Diaz, encourage in treatment, but do not overstep trying to reach his psychiatrist.  OK to make statement providing concerns but be sure not to badger.  Prepare to swear out a petition if he shows a frank danger to self or others, and do stick to behavioral limits, offer if sensible to connect him with what family believe will help.  Continue being willing to decline services or interactions where needed and require his effort (or honesty, acknowledgment) first.  Re. mother,  try best to keep level and educate her where needed.  Re. Casey Diaz, prioritize asking over  resenting.  With all, obtain a yes to have a difficult conversation before going into it and try not to frontload extra request -- pursue one assertiveness issue at a time. Family assistance, risk of perceived codependency -- Self-affirm that wanting to help is not dysfunctional, all calls are judgment calls.  Option Al-Anon for support and boundary help.  Endorse connecting [place] to further help, either through [facility] or [facility], and recruiting family members into necessary confrontation. Relationship with Casey Diaz -- Open to join tx.  Consider addressing Casey Diaz's overinterpretations of "choosing" Casey Diaz over Casey Diaz/him and perceived negativity toward Casey Diaz.  Consider further willingness to challenge sexual habits on grounds of feeling left out and/or objectified, and ask for Casey Diaz to work out sexual expectations together rather than simply accede to his perceived demands and in the process help mislead him.  Overall, take care to approach one issue at a time with him, too. Parenting -- Continue appropriate efforts to shape Casey Diaz's socializing and responsibility to clean up after himself, e.g., "after you ___, then you can ___".  Re. perceived loss of close relationship, self-affirm that she always wanted to create a "buddy" but any adolescent boy still distances and may go through a sullen, isolative period.  Other options for therapy for him.  Endorse summer camp as planned, see tips for communicating and working with resistance. Depressive thinking -- Generally, look for thought patterns of shaming self irrationally, and collecting troubles to the point of desperation, and dispute.  Both are distress-making and interfere with interpersonal effectiveness.  At the same time, guard against collecting offenses to the point of resentment, since portraying resentment will only get in the way of getting priorities listened to by others. Intrusive thoughts and checking compulsions -- Practice ad lib pressing on without checking corners and  spaces for imaginary abused children.  Same with driving and return checking to see if she hit someone.  Practice trust and move on, and as needed self-remind that these ideas come up because she has been a victim before, she naively perpetrated once in adolescence, and OCD picks up whatever you feel is most important to create false guilt. Self-care -- Continue efforts to engage exercise, part-time work, and supportive relationship outside the home.  Continue to grow in reasonably representing her physical limitations and needs without self-shaming Medication -- endorse Spravato treatment for resistant depression and overlearned obsessions and compulsions Other recommendations/advice as may be noted above Continue to utilize previously learned skills ad lib Maintain medication as prescribed and work faithfully with relevant prescriber(s) if any changes are desired or seem indicated Call the clinic on-call service, 988/hotline, 911, or present to [facility] or ER if any life-threatening psychiatric crisis Return for time as already scheduled. Already scheduled visit in this office 03/20/2023.  Robley Fries, PhD [name], PhD LP Clinical Psychologist, [facility] [phone]  [address], [address] [place], [place] [address] [phone]

## 2023-03-12 ENCOUNTER — Other Ambulatory Visit: Payer: Self-pay | Admitting: Psychiatry

## 2023-03-12 DIAGNOSIS — F422 Mixed obsessional thoughts and acts: Secondary | ICD-10-CM

## 2023-03-12 DIAGNOSIS — F339 Major depressive disorder, recurrent, unspecified: Secondary | ICD-10-CM

## 2023-03-15 ENCOUNTER — Other Ambulatory Visit: Payer: Self-pay | Admitting: Psychiatry

## 2023-03-15 DIAGNOSIS — G43009 Migraine without aura, not intractable, without status migrainosus: Secondary | ICD-10-CM

## 2023-03-20 ENCOUNTER — Ambulatory Visit (INDEPENDENT_AMBULATORY_CARE_PROVIDER_SITE_OTHER): Payer: 59 | Admitting: Psychiatry

## 2023-03-20 ENCOUNTER — Ambulatory Visit: Payer: 59

## 2023-03-20 VITALS — BP 121/86 | HR 72

## 2023-03-20 DIAGNOSIS — G43009 Migraine without aura, not intractable, without status migrainosus: Secondary | ICD-10-CM | POA: Diagnosis not present

## 2023-03-20 DIAGNOSIS — F339 Major depressive disorder, recurrent, unspecified: Secondary | ICD-10-CM | POA: Diagnosis not present

## 2023-03-20 DIAGNOSIS — F5105 Insomnia due to other mental disorder: Secondary | ICD-10-CM

## 2023-03-20 DIAGNOSIS — F422 Mixed obsessional thoughts and acts: Secondary | ICD-10-CM | POA: Diagnosis not present

## 2023-03-20 DIAGNOSIS — F4010 Social phobia, unspecified: Secondary | ICD-10-CM | POA: Diagnosis not present

## 2023-03-20 NOTE — Progress Notes (Signed)
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 Pt is being treated for Treatment Resistant Depression, Pt taken to treatment room. Pt's medication is charged through [employer].  Medication is stored behind 2 locked doors, it is never given to the pt until time of administration which is observed by the nurse. Disposed of per FDA/REMS regulations. All Spravato Treatments are documented in Spravato REMS per protocol of being a treatment center.    She was directed to the treatment room to get vitals taken first. Initial vital signs are B/P at 3:15 PM 117/87, 74, Pt instructed to blow her nose and to recline back at 45 degrees. Pt given first nasal spray (28 mg) administered by pt observed by nurse. Pt always has to go to the restroom after her first dose, at that point she is not too medicated to walk to bathroom safely.There were 5 minutes between each dose, total of 84 mg. Tolerated well. Pt's medication is delivered by [facility] in [place] and stored inside a safe behind a locked door as well. Spravato is a CIII medication and has to be only given at a treatment facility and observed by nurse as pt administered intranasally.  Assessed pt's 40 minute vital signs at 4:05 PM 121/86, 72. [name] met with pt to discuss her care at the end of her treatment when her thoughts are clearer and they discussed her medication and moods. She does go to the bathroom at least once during her treatment and again towards the end when she is clearer and able to walk. No sedation and had slight feeling of being "high" she reports. Discharge vitals at 5:10 PM 124/94, 71. Pt stable for discharge.  Pt was observed on site a total of 120 minutes per FDA/REMS requirements. Pt was with nurse for clinical assessment 50 minutes. Pt is scheduled for next Monday, June 10th  for her next treatment.     LOT [id number]   EXP FEB 2027

## 2023-03-27 ENCOUNTER — Ambulatory Visit: Payer: 59 | Admitting: Psychiatry

## 2023-03-27 ENCOUNTER — Ambulatory Visit (INDEPENDENT_AMBULATORY_CARE_PROVIDER_SITE_OTHER): Payer: 59 | Admitting: Adult Health

## 2023-03-27 ENCOUNTER — Other Ambulatory Visit: Payer: Self-pay | Admitting: Psychiatry

## 2023-03-27 ENCOUNTER — Encounter: Payer: Self-pay | Admitting: Adult Health

## 2023-03-27 ENCOUNTER — Ambulatory Visit: Payer: Managed Care, Other (non HMO)

## 2023-03-27 VITALS — BP 117/83 | HR 73

## 2023-03-27 DIAGNOSIS — F339 Major depressive disorder, recurrent, unspecified: Secondary | ICD-10-CM

## 2023-03-27 DIAGNOSIS — G43009 Migraine without aura, not intractable, without status migrainosus: Secondary | ICD-10-CM

## 2023-03-27 NOTE — Progress Notes (Signed)
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 Pt is being treated for Treatment Resistant Depression, Pt taken to treatment room. Pt's medication is charged through [facility].  Medication is stored behind 2 locked doors, it is never given to the pt until time of administration which is observed by the nurse. Disposed of per FDA/REMS regulations. All Spravato Treatments are documented in Spravato REMS per protocol of being a treatment center.    She was directed to the treatment room to get vitals taken first. Initial vital signs are B/P at 3:05 PM 111/82, 75, Pt instructed to blow her nose and to recline back at 45 degrees. Pt given first nasal spray (28 mg) administered by pt observed by nurse. Pt always has to go to the restroom after her first dose, at that point she is not too medicated to walk to bathroom safely.There were 5 minutes between each dose, total of 84 mg. Tolerated well. Pt's medication is delivered by [facility] in [place] and stored inside a safe behind a locked door as well. Spravato is a CIII medication and has to be only given at a treatment facility and observed by nurse as pt administered intranasally.  Assessed pt's 40 minute vital signs at 3:50 PM 107/77, 73.  Yvette Rack, DNP met with pt to discuss her care at the end of her treatment when her thoughts are clearer and they discussed her medication and moods. She does go to the bathroom at least once during her treatment and again towards the end when she is clearer and able to walk. No sedation and had slight feeling of being "high" she reports. Discharge vitals at 5:00 PM 117/83, 74. Pt stable for discharge.  Pt was observed on site a total of 120 minutes per FDA/REMS requirements. Pt was with nurse for clinical assessment 50 minutes. Pt is scheduled for next Monday,  June 17 th for her next treatment.     LOT [id number]   EXP JAN 2027

## 2023-03-27 NOTE — Progress Notes (Signed)
[name] [id number] 1966-03-03 57 y.o.  Subjective:   Patient ID:  Casey Diaz is a 57 y.o. (DOB 1966/05/19) female.  Chief Complaint: No chief complaint on file.   HPI Erisa E Tetrick presents to the office today for follow-up of follow-up of treatment resistant depression (TRD).   Spravato treatment    Patient was administered Spravato 84 mg intranasally today. Patient was observed by provider throughout [facility] treatment. The patient experienced the typical dissociation which gradually resolved over the 2-hour period of observation. There were no complications. Specifically, the patient did not have any untoward side effects - feeling disconnected from themself, their thoughts, feelings and things around them, dizziness, nausea, feeling sleepy, decreased feeling of sensitivity (numbness) spinning sensation, feeling anxious, lack of energy, increased blood pressure, feeling happy or very excited, or headache. Blood pressures remained within normal ranges at the 40-minute and 2-hour follow-up intervals. By the time the 2-hour observation period was met the patient was alert and oriented and able to exit without assistance. Patient willing to continue Spravato administration for the treatment of resistant depression. See nursing note for further details.     ECT-MADRS    Flowsheet Row Office Visit from 06/29/2021 in [facility]  MADRS Total Score 36      Flowsheet Row Admission (Discharged) from 06/11/2021 in [facility]  C-SSRS RISK CATEGORY No Risk        Review of Systems:  Review of Systems  Musculoskeletal:  Negative for gait problem.  Neurological:  Negative for tremors.  Psychiatric/Behavioral:         Please refer to HPI    Medications: I have reviewed the patient's current medications.  Current Outpatient Medications  Medication Sig Dispense Refill   Abaloparatide (TYMLOS) 3120 MCG/1.56ML SOPN Inject into the skin.      Azelastine-Fluticasone 137-50 MCG/ACT SUSP Place 1-2 sprays into both nostrils daily.     baclofen (LIORESAL) 10 MG tablet Take 20 mg by mouth at bedtime as needed for muscle spasms.     buPROPion (WELLBUTRIN XL) 150 MG 24 hr tablet TAKE 3 TABLETS BY MOUTH DAILY 270 tablet 1   dicyclomine (BENTYL) 10 MG capsule Take 10 mg by mouth daily.     docusate sodium (COLACE) 100 MG capsule Take 1 capsule (100 mg total) by mouth 2 (two) times daily. (Patient taking differently: Take 100 mg by mouth daily.) 10 capsule 0   Esketamine HCl, 84 MG Dose, (SPRAVATO, 84 MG DOSE,) 28 MG/DEVICE SOPK USE 3 SPRAYS IN EACH NOSTRIL EVERY OTHER WEEK 3 each 0   fexofenadine (ALLEGRA) 180 MG tablet Take 180 mg by mouth daily.     fluvoxaMINE (LUVOX) 100 MG tablet TAKE 1 TABLET IN THE AM AND 3 TABLETS AT NIGHT 120 tablet 0   hydrocortisone (ANUSOL-HC) 2.5 % rectal cream Place rectally 2 (two) times daily. x 7-14 days 30 g 0   ketotifen (ZADITOR) 0.025 % ophthalmic solution Place 3 drops into both eyes at bedtime.     LORazepam (ATIVAN) 1 MG tablet TAKE 1-2 IN THE AM AND 1-2 TABLETS EVERY NIGHT AT BEDTIME AND 1 TABLET IN AFTERNOON WHEN NEEDED FOR ANXIETY AND SLEEP 150 tablet 1   magnesium gluconate (MAGONATE) 500 MG tablet Take 500 mg by mouth daily.     MIBELAS 24 FE 1-20 MG-MCG(24) CHEW Chew 1 tablet by mouth at bedtime as needed (bowel regularity).     Multiple Vitamins-Minerals (ADULT GUMMY PO) Take 2 tablets by mouth  in the morning.     nitrofurantoin (MACRODANTIN) 100 MG capsule Take 100 mg by mouth as needed (For urinary tract infection.).      oxyCODONE-acetaminophen (PERCOCET/ROXICET) 5-325 MG tablet Take 1-2 tablets by mouth every 6 (six) hours as needed for severe pain. 50 tablet 0   polyethylene glycol (MIRALAX / GLYCOLAX) packet Take 17 g by mouth daily as needed for mild constipation. 14 each 0   propranolol ER (INDERAL LA) 60 MG 24 hr capsule TAKE 1 CAPSULE BY MOUTH EVERY DAY 30 capsule 5   psyllium  (METAMUCIL) 58.6 % powder Take 1 packet by mouth daily as needed (constipation).     SUMAtriptan (IMITREX) 100 MG tablet TAKE 1 TABLET (100 MG TOTAL) BY MOUTH EVERY 2 (TWO) HOURS AS NEEDED FOR MIGRAINE. MAY REPEAT IN 2 HOURS IF HEADACHE PERSISTS OR RECURS. 10 tablet 1   temazepam (RESTORIL) 30 MG capsule Take 1 capsule by mouth at bedtime as needed for sleep 30 capsule 2   Vitamin D-Vitamin K (VITAMIN K2-VITAMIN D3 PO) Take 1-2 sprays by mouth daily.     No current facility-administered medications for this visit.    Medication Side Effects: None  Allergies:  Allergies  Allergen Reactions   Hydrocodone Itching   Sulfamethoxazole-Trimethoprim Itching   Dust Mite Extract Other (See Comments)    Sneezing, watery eyes, runny nose   Latex Itching   Other Other (See Comments)    PT IS ALLERGIC TO CAT DANDER AND RAGWEED - Sneezing, watery eyes, runny nose    Pollen Extract Other (See Comments)    Sneezing, watery eyes, runny nose     Past Medical History:  Diagnosis Date   Abnormal Pap smear 2011   hpv/mild dysplasia,cin1   Anxiety    Cerebral palsy (HCC)    right arm/leg   Cystocele    Depression    Headache    Neuromuscular disorder (HCC)    Cerebral Palsy   OCD (obsessive compulsive disorder)    Osteoporosis    Uterine prolaps     Past Medical History, Surgical history, Social history, and Family history were reviewed and updated as appropriate.   Please see review of systems for further details on the patient's review from today.   Objective:   Physical Exam:  LMP  (LMP Unknown)   Physical Exam Constitutional:      General: She is not in acute distress. Musculoskeletal:        General: No deformity.  Neurological:     Mental Status: She is alert and oriented to person, place, and time.     Coordination: Coordination normal.  Psychiatric:        Attention and Perception: Attention and perception normal. She does not perceive auditory or visual hallucinations.         Mood and Affect: Mood normal. Mood is not anxious or depressed. Affect is not labile, blunt, angry or inappropriate.        Speech: Speech normal.        Behavior: Behavior normal.        Thought Content: Thought content normal. Thought content is not paranoid or delusional. Thought content does not include homicidal or suicidal ideation. Thought content does not include homicidal or suicidal plan.        Cognition and Memory: Cognition and memory normal.        Judgment: Judgment normal.     Comments: Insight intact     Lab Review:     Component Value Date/Time  NA 138 06/11/2021 0606   K 4.0 06/11/2021 0606   CL 107 06/11/2021 0606   CO2 26 06/11/2021 0606   GLUCOSE 90 06/11/2021 0606   BUN 18 06/11/2021 0606   CREATININE 0.81 06/11/2021 0606   CALCIUM 9.4 06/11/2021 0606   PROT 6.5 06/11/2021 0606   ALBUMIN 3.3 (L) 06/11/2021 0606   AST 17 06/11/2021 0606   ALT 14 06/11/2021 0606   ALKPHOS 141 (H) 06/11/2021 0606   BILITOT 0.2 (L) 06/11/2021 0606   GFRNONAA >60 06/11/2021 0606   GFRAA >60 07/09/2016 0438       Component Value Date/Time   WBC 5.8 06/11/2021 0606   RBC 4.12 06/11/2021 0606   HGB 12.5 06/11/2021 0606   HCT 39.7 06/11/2021 0606   PLT 299 06/11/2021 0606   MCV 96.4 06/11/2021 0606   MCH 30.3 06/11/2021 0606   MCHC 31.5 06/11/2021 0606   RDW 13.9 06/11/2021 0606   LYMPHSABS 1.9 06/11/2021 0606   MONOABS 0.5 06/11/2021 0606   EOSABS 0.1 06/11/2021 0606   BASOSABS 0.0 06/11/2021 0606    No results found for: "POCLITH", "LITHIUM"   No results found for: "PHENYTOIN", "PHENOBARB", "VALPROATE", "CBMZ"   .res Assessment: Plan:    Recommendations/Plan   Continue Spravato treatment.   Patient advised to contact office with any questions, adverse effects, or acute worsening in signs and symptoms. There are no diagnoses linked to this encounter.   Please see After Visit Summary for patient specific instructions.  Future Appointments  Date  Time Provider Department Center  05/09/2023  4:00 PM Robley Fries, PhD [facility] None  05/23/2023 10:00 AM Robley Fries, PhD [facility] None  06/06/2023 11:00 AM Robley Fries, PhD [facility] None  06/20/2023 11:00 AM Robley Fries, PhD [facility] None    No orders of the defined types were placed in this encounter.   -------------------------------

## 2023-03-30 ENCOUNTER — Other Ambulatory Visit: Payer: Self-pay | Admitting: Psychiatry

## 2023-03-30 ENCOUNTER — Ambulatory Visit (INDEPENDENT_AMBULATORY_CARE_PROVIDER_SITE_OTHER): Payer: 59 | Admitting: Psychiatry

## 2023-03-30 DIAGNOSIS — Z636 Dependent relative needing care at home: Secondary | ICD-10-CM

## 2023-03-30 DIAGNOSIS — F4010 Social phobia, unspecified: Secondary | ICD-10-CM | POA: Diagnosis not present

## 2023-03-30 DIAGNOSIS — Z638 Other specified problems related to primary support group: Secondary | ICD-10-CM

## 2023-03-30 DIAGNOSIS — F422 Mixed obsessional thoughts and acts: Secondary | ICD-10-CM | POA: Diagnosis not present

## 2023-03-30 DIAGNOSIS — F339 Major depressive disorder, recurrent, unspecified: Secondary | ICD-10-CM | POA: Diagnosis not present
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
## 2023-03-31 ENCOUNTER — Encounter: Payer: Self-pay | Admitting: Psychiatry

## 2023-03-31 NOTE — Progress Notes (Signed)
Casey Diaz [id number] 1966/10/04 57 y.o.    Subjective:   Patient ID:  Casey Diaz is a 57 y.o. (DOB January 18, 1966) female.  Chief Complaint:  Chief Complaint  Patient presents with   Follow-up   Depression   Anxiety     HPI Casey Diaz presents to the office today for follow-up of OCD and severe anxiety.     December 2019 visit the following was noted: No meds were changed. Lives in [name] and back for followup.  Sx are about the same.  Has to take meds with different sizes. Pt reports that mood is Anxious and Depressed and describes anxiety as Severe. Anxiety symptoms include: Excessive Worry, Obsessive Compulsive Symptoms:   Checking,,. Pt reports has interrupted sleep and nocturia. Pt reports that appetite is good. Pt reports that energy is no change and down slightly. Concentration is down slightly. Suicidal thoughts:  denied by patient. Loves the environment of [name] but misses some things there.  She's not able to work there.  H works there and likes it.  Struggled with not working, feels isolated and not up to task of meeting people.  Does attend a church and met a friend who's been helpful.  Leaving for [name] on 10/16/18.   04/09/2020 appointment the following is noted:  Staying another year in [name] bc Covid and other things. Last few months a lot of crying spells.  Is in menopause. Wonders about med changes though is nervous about it.  Crying spells associated with depressing thoughts more than stress or OCD.   Covid really hard on everyone and couldn't see family for 18 mos.  Family still very dysfunctional. No close friends in part due to OCD and depression. Son high Autism spectrum with ADHD and anxiety and she's with him all the time. Greater health problems with CP so more pains.   05/15/20 appt with the following noted: Casey Diaz for menopause and helps some. Still depressed.  Chronically. In [place] for 2 more weeks then to [name] for another  year. A lot of stressors lately triggering more checking and anxiety.   OCD is her CC now and seems.  Got worse DT stress.   Stressed with Asberger's son and her health.  H works a lot.  Her FOO still stress. Plan: Trintellix 10 mg 1 tablet in the morning with food and reduce fluvoxamine to 5 tablets nightly for 1 week  then reduce it to 4 tablets nightly.   07/02/20 appt with the following noted: Decided not to get Trintellix bc difficulty getting it. It is available.  There.  Wants to start it now.   Both depression and OCD are severe.  Not suicidal in intent or plan. Did not take samples with her to [name] but will be back in December. covid is worse there and travel is difficult.  Wants to reduce Wellbutrin DT dry mouth. Plan: She's afraid to reduce Luvox at this time DT fear of worsening OCD.  But will consider. Trintellix 10 mg 1 tablet in the morning with food and reduce fluvoxamine to 5 tablets nightly for 1 week  then reduce it to 4 tablets nightly. Also reduce Wellbutrin XL to 300 mg daily.    9-13 2022 appointment with the following noted: Back in [place] since July 14.  Broke arm a month ago and surgery.  It's all been rough adjustment.   B has cancer on his face and M fell taking him to the doctor.  Misses the water  and weather of [place].   Cry a lot more since menopause. Still depression and anxiety and OCD.  Asks about ketamine. On Wellbutrin 300, Luvox 300.  No Trintellix. Added Ativan 2 mg AM and HS and it helps.  More likely to get upset at night. Plan: Increase Luvox back to 400 mg daily.  She thinks she's worse on less. Continue Wellbutrin XL to 300 mg daily. 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  She tolerated it well without unusual nausea or vomiting headache or other somatic symptoms.  She did have the expected dissociation which gradually resolved over the course  of the 2-hour period of observation.  She was a little concerned about her balance given her cerebral palsy but has not noted unusual or unexpected problems.  She is motivated to can continue Spravato in hopes of reducing her depressive symptoms. 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  She also has treatment resistant OCD which is partially managed with medications but is still quite disabling.  She is tolerating the medications well.  She is sleeping adequately.  Her appetite is adequate.  She is not having suicidal thoughts.  She continues to wish for a better treatment for OCD that would give her some relief.  09/30/2021 appointment with the following noted: She received her first dose of Spravato 84 mg intranasally today.  She tolerated it well without unusual nausea, vomiting, or other somatic symptoms.  Dissociation as expected did occur and gradually resolved over the 2-hour period of observation.  She did have a mild headache today with the treatment and received ibuprofen 600 mg at her request.  We will follow this to see if it is a pattern Patient is still depressed.  She said she was late with her medicine today and today was a particularly depressing day.  However she notes that the Spravato has lifted her mood considerably even today.  She is hopeful that it will continue to be helpful.  No suicidal thoughts.  She has ongoing chronic anxiety and OCD at baseline.  10/04/21 appt noted: Patient received Spravato 84 mg for the second time today.  She tolerated it well without any unusual headache, nausea or vomiting or other somatic symptoms.  Dissociation did occur and she gradually [employer] resolution over the 2-hour period of observation. She did not have any unusual problems after she left the office last Spravato administration.  She did not have any specific problems with balance or walking.  She is at increased risk of that difficulty because of cerebral  palsy.  So far she has not noticed much mood effect from the medication beyond the first day of receiving it.  However she would like to continue Spravato in hopes of getting the antidepressant effect that is desired. Stress dealing with mother's behavior at party pt hosted.  Guilt over it.  10/07/2021 appointment noted: Patient received Spravato 84 mg for the second time today.  She tolerated it well without any unusual headache, nausea or vomiting or other somatic symptoms.  Dissociation did occur and she gradually Kelley resolution over the 2-hour period of observation. She still is not sure about the antidepressant effect of Spravato.  Events over the holidays and demands, make it difficult to assess.  She still notes that the OCD tends to worsen the depression and vice versa.  She tolerates the Spravato well and  wants to continue the trial.  10/15/2021 appointment with the following noted: Patient received Spravato 84 mg for the second time today.  She tolerated it well without any unusual headache, nausea or vomiting or other somatic symptoms.  Dissociation did occur and she gradually [place] resolution over the 2-hour period of observation. Patient says it was somewhat difficult to evaluate the effect of the Spravato.  It was scheduled to be twice weekly for 4 weeks consecutively but the holidays have interfered with that administration.  She asked what specifically should be she should be looking for in order to assess improvement.  That was discussed.  The OCD is unchanged and the depression so far is not significantly different.  She still tolerates meds.  There have been no recent med changes  10/19/2021 appt noted: Patient received Spravato 84 mg for the second today.  She tolerated it well without any unusual headache, nausea or vomiting or other somatic symptoms.  Dissociation did occur and she gradually saw resolution over the 2-hour period of observation.   10/21/2021 appointment noted: Patient  received Spravato 84 mg today.  She tolerated it well without any unusual headache, nausea or vomiting or other somatic symptoms.  Dissociation did occur and she gradually saw resolution over the 2-hour period of observation.  She feels better than last week.  She is not as depressed and down.  She is still dealing with grief around the death of her cousin that was unexpected.  It is still difficult to tell how much the Spravato was doing but she is hopeful.  Anxiety is still present with the OCD.  She is not having suicidal thoughts.  She is not hopeless.  She wants to continue treatment.  10/25/2021 appointment with the following noted: Patient received Spravato 84 mg today.  She tolerated it well without any unusual headache, nausea or vomiting or other somatic symptoms.  Dissociation did occur and she gradually saw resolution over the 2-hour period of observation.  She does not typically find the dissociation very strong. She is beginning to think the Spravato is helping somewhat with the depression.  It has been difficult to tell with the holidays intervening as well as the death of her cousin.  She has not been able to get Spravato twice weekly for 4 weeks straight as typically planned.  However she is hopeful.  The OCD remains significant.  She still has a tendency to think very negatively.  She is not suicidal.  10/28/2021 appointment with the following noted: Patient received Spravato 84 mg today.  She tolerated it well without any unusual headache, nausea or vomiting or other somatic symptoms.  Dissociation did occur and she gradually saw resolution over the 2-hour period of observation.  She does not typically find the dissociation very strong. She is feeling more hopeful about the administration of Spravato.  She is having less depression she believes.  Still not dramatically different.  She still has a tendency to have a lot of anxiety and rumination and OCD.  She is not suicidal.  She is eager to  continue the Spravato.  11/01/2021 appointment with the following noted: Patient received Spravato 84 mg today.  She tolerated it well without any unusual headache, nausea or vomiting or other somatic symptoms.  Dissociation did occur and she gradually saw resolution over the 2-hour period of observation.  She does not typically find the dissociation very strong. She is continuing to see a little bit of improvement in depression with Spravato.  The anxiety remains but may be not as severe.  The OCD remains markedly severe chronically.  She is not suicidal.  She is encouraged by the degree of improvement with Spravato and inability to enjoy things more and not be quite as ruminative.  11/04/2021 appt noted: Patient received Spravato 84 mg today.  She tolerated it well without any unusual headache, nausea or vomiting or other somatic symptoms.  Dissociation did occur and she gradually saw resolution over the 2-hour period of observation.  She does not typically find the dissociation very strong. No SE complaints with meds. She continues to feel hopeful about the Spravato.  She has less depression.  Because of a number of factors she is uncertain of the full benefit but thinks she is somewhat less depressed.  Her anxiety and OCD remain significant but a little better.  She is tolerating the medications and does not desire medicine change.  She is not currently complaining of insomnia.   11/08/2021 appointment the following noted: Patient received Spravato 84 mg today.  She tolerated it well without any unusual headache, nausea or vomiting or other somatic symptoms.  Dissociation did occur and she gradually saw resolution over the 2-hour period of observation.  She does not typically find the dissociation very strong. No SE complaints with meds. She feels the Spravato is helping somewhat.  She would like to see a greater effect.  However she is able to enjoy things.  She is productive at home.  She would like  to see a lifting of a degree of sadness that remains.  The anxiety and OCD remained largely unchanged.  She wondered about the dosing of Wellbutrin 300 mg a day and Luvox 300 mg a day and possible increases.  She has been at higher doses in the past.  She plans to start water therapy for her weakness and for her shoulder.  11/11/2021 appointment with the following noted: Patient received Spravato 84 mg today.  She tolerated it well without any unusual headache, nausea or vomiting or other somatic symptoms.  Dissociation did occur and she gradually saw resolution over the 2-hour period of observation.  She does not typically find the dissociation very strong. No SE complaints with meds. She feels the Spravato is clearly helping the depression.  She would like to see a more significant effect.  She is still having trouble thinking positive. Her energy is fair.  Concentration is good except for the problem with chronic obsessions. She has been taking Wellbutrin 300 mg in Luvox 300 mg for quite some time but has taken higher doses in the past.  We discussed that.  She would like to try higher doses in order to get a better effect if possible. We just increased the doses a couple of days ago.  No effect yet.  11/15/2021 appointment with the following noted: Patient received Spravato 84 mg today.  She tolerated it well without any unusual headache, nausea or vomiting or other somatic symptoms.  Dissociation did occur and she gradually saw resolution over the 2-hour period of observation.  She does not typically find the dissociation very strong. No SE complaints with meds. The patient is now convinced that the Spravato is helping the depression.  She would like to continue twice weekly Spravato this week if possible.  She has tolerated the increase in Wellbutrin to 450 mg daily and the increase and fluvoxamine to 400 mg daily without complications thus far.  The OCD and anxiety feed the depression to  some  extent. She spends approximately 2 hours daily with checking compulsions due to obsessions about causing harm to others.  For example fearing that when she has hit a pot hole that she may have hit a person and going back to check.  Checking corners and rooms out of fear that she may have harmed someone.  Other various checking compulsions.  She is hoping the increase in fluvoxamine to 400 mg will reduce that over the weeks to come.  She is not seeing a significant difference with the addition of the Spravato though she understands that was not expected.  She is more productive at home and more motivated and able to enjoy things more fully as a result of the Spravato treatment.  She is tolerating the medication  11/18/2021 appointment with the following noted: Patient received Spravato 84 mg today.  She tolerated it well without any unusual headache, nausea or vomiting or other somatic symptoms.  Dissociation did occur and she gradually saw resolution over the 2-hour period of observation.  She does not typically find the dissociation very strong. No SE complaints with meds. She clearly believes the Spravato has been helpful for the depression.  She wonders whether to continue to treatments weekly or to cut back to 1 weekly.  She would like to continue twice weekly in hopes of getting additional improvement in the depression because it is not resolved but it is difficult to get here twice a week in terms of arranging rides. She is recently increased Wellbutrin XL to 450 mg daily and fluvoxamine to 400 mg daily but they have not had time to have an official effect.  She is tolerating that well.  She is tolerating meds overwork overall well. The OCD remains the same as noted on 11/15/2021  11/25/21 appt noted: Patient received Spravato 84 mg today.  She tolerated it well without any unusual headache, nausea or vomiting or other somatic symptoms.  Dissociation did occur and she gradually saw resolution over the  2-hour period of observation.  She does not typically find the dissociation very strong. No SE complaints with meds. She thinks the increase in Wellbutrin and Luvox have been potentially helpful for depression and OCD respectively.  It has been too early to see the full effect.  She is sleeping and eating well.  She is functioning at home.  She still spends a lot of time that is about 2 hours a day dealing with compulsive behaviors.  12/02/21 appt noted: Patient received Spravato 84 mg today.  She tolerated it well without any unusual headache, nausea or vomiting or other somatic symptoms.  Dissociation did occur and she gradually saw resolution over the 2-hour period of observation.  She does not typically find the dissociation very strong. No SE complaints with meds. Several losses and stressors recently that affect her sense of mood. However still sees significant benefit from the Spravato for her depression.  Wants to continue it. Suspect early  some benefit from the increased Wellbutrin for depression and Luvox for OCD. Tolerating meds. No complaints about the meds. Sleeping and eating well.  No new health concerns.  12/09/21 appt noted: Patient received Spravato 84 mg today.  She tolerated it well without any unusual headache, nausea or vomiting or other somatic symptoms.  Dissociation did occur and she gradually saw resolution over the 2-hour period of observation.  She does not typically find the dissociation very strong. No SE complaints with meds. Seeing noticeable improvement from increase fluvoxamine to  400 mg daily.  Tolerating meds without concerns over them. Depression is stable with residual sx of easy guilt and easily stressed.  OCD contributes to depression but depression is not severe with less crying spells.  Productive at home with chores.  Enjoyed recent birthday.  Sleeping good. No new concerns.  12/23/2021 appointment noted: Patient received Spravato 84 mg today.  She  tolerated it well without any unusual headache, nausea or vomiting or other somatic symptoms.  Dissociation did occur and she gradually saw resolution over the 2-hour period of observation.  She does not typically find the dissociation very strong. No SE complaints with meds. Seeing noticeable improvement from increase fluvoxamine to 400 mg daily.  Tolerating meds without concerns over them. Her depression is somewhat improved with the Spravato.  She also feels generally a little lighter.  She is more motivated.  She is less overwhelmed by guilt.  The OCD is gradually improving but is still quite time-consuming as noted before.  She is sleeping well.  No side effects  12/30/2021 appointment with the following noted: Patient received Spravato 84 mg today.  She tolerated it well without any unusual headache, nausea or vomiting or other somatic symptoms.  Dissociation did occur and she gradually saw resolution over the 2-hour period of observation.  She does not typically find the dissociation very strong. No SE complaints with meds. Seeing noticeable improvement from increase fluvoxamine to 400 mg daily.  Tolerating meds without concerns over them. She is confident of her the improvement seen with Spravato.  She is less hopeless.  Guilt is marked remarkably improved.  She is not having any thoughts of death or dying.  She is more motivated for activities such as exercise which she is recently started.  She is sleeping well. The OCD remains severe but it is improving somewhat with the increase in fluvoxamine.  It is still consuming a couple hours per day.  01/10/22 apravato 84 admin  01/24/22 appt noted: Patient received Spravato 84 mg today.  She tolerated it well without any unusual headache, nausea or vomiting or other somatic symptoms.  Dissociation did occur and she gradually saw resolution over the 2-hour period of observation.  She does not typically find the dissociation very strong. No SE  complaints with meds. Very tearful today.  Feels like she has been suppressing emotion in the Spravato caused it to be released.  Discussed some stressors.  Overall still feels the medicine is helpful.  She has missed some of the scheduled Spravato treatments that were intended to be weekly due to circumstances beyond her control.  She is still struggling with OCD as previously noted but does believe the medications are helpful. Plan no med changes  01/31/2022 received Spravato 84 mg today  02/09/2022 appointment with the following noted: Patient received Spravato 84 mg today.  She tolerated it well without any unusual headache, nausea or vomiting or other somatic symptoms.  Dissociation did occur and she gradually saw resolution over the 2-hour period of observation.  She does not typically find the dissociation very strong. No SE complaints with meds. Spravato clearly helps depression and OCD but easily gets overwhelmed and tearful with fairly routine stressors.  Tolerating meds. Sleep and appetite is OK Asks to increase lorazepam to 2 mg AM and HS and 46m afternoon  02/16/22 appt noted: Patient received Spravato 84 mg today.  She tolerated it well without any unusual headache, nausea or vomiting or other somatic symptoms.  Dissociation did occur and she  gradually saw resolution over the 2-hour period of observation.  She does not typically find the dissociation very strong. No SE complaints with meds. She has chronic depesssion and OCD but is improved with Spravato, both dx versus before.  She has continued Luvox 400 mg and Wellbutrin 450 mg and is tolerating it.  Chronically easily stressed.  Tolerating all meds.  Doesn't like taking more meds.  Spending a couple hours daily with OCD.  No SI No med changes.  02/21/22 appt noted:   Doesn't like taking more meds.  Spending a couple hours daily with OCD.  No SI No med changes.  02/21/22 appt noted: Patient received Spravato 84 mg today.  She  tolerated it well without any unusual headache, nausea or vomiting or other somatic symptoms.  Dissociation did occur and she gradually saw resolution over the 2-hour period of observation.  She does not typically find the dissociation very strong. No SE complaints with meds. She has chronic depesssion and OCD but is improved with Spravato, both dx versus before.  She has continued Luvox 400 mg and Wellbutrin 450 mg and is tolerating it.  Chronically easily overwhelmed and doesn't know why.  Tolerating all meds. Wants to continue meds.  03/16/22 appt noted: Patient received Spravato 84 mg today.  She tolerated it well without any unusual headache, nausea or vomiting or other somatic symptoms.  Dissociation did occur and she gradually saw resolution over the 2-hour period of observation.  She does not typically find the dissociation very strong. No SE complaints with meds. Overall she still feels the Spravato has been helpful not only for her depression but also for her OCD which was somewhat unexpected.  OCD is still significant but it is less severe than prior to starting Spravato.  She is tolerating Luvox 400 mg and Wellbutrin 450 mg.  We discussed possible med adjustments.  03/23/22 appt noted: Patient received Spravato 84 mg today.  She tolerated it well without any unusual headache, nausea or vomiting or other somatic symptoms.  Dissociation did occur and she gradually saw resolution over the 2-hour period of observation.  She does not typically find the dissociation very strong. No SE complaints with meds. She is still depressed and still has OCD of course but is improved with the Spravato.  She is tolerating the medications well.  We had previously discussed the possibility of switching some of the Wellbutrin to [facility] and she is very interested in that in hopes of further improvement in depression and OCD.  She understands that Auvelity is not used for OCD on the label.  She is tolerating the  medications.  She is still easily overwhelmed.  She is sleeping and eating okay.. Plan: Reduce Wellbutrin XL to 300 mg AM and add Auvelity 1 tablet each AM  03/30/22 appt noted: Patient received Spravato 84 mg today.  She tolerated it well without any unusual headache, nausea or vomiting or other somatic symptoms.  Dissociation did occur and she gradually saw resolution over the 2-hour period of observation.  She does not typically find the dissociation very strong. No SE complaints with meds. She is still depressed and still has OCD of course but is improved with the Spravato.  She is tolerating the medications well.  No difference with Auvelity 1 AM so far and no SE.  Going on vacation on Saturday. 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 Sleep and appetite good. Plan: Increase Auvelity to 1 twice daily and reduce Wellbutrin to XL 150  every morning  04/14/2022 appointment with the following noted: Patient received Spravato 84 mg today.  She tolerated it well without any unusual headache, nausea or vomiting or other somatic symptoms.  Dissociation did occur and she gradually saw resolution over the 2-hour period of observation.  She does not typically find the dissociation very strong. No SE complaints with meds. She is still depressed and still has OCD of course but is improved with the Spravato.  She is tolerating the medications well.  Just increased Auvelity to BID yesterday and reduced Wellbutrin to 150 AM. No SE so far.  No change in mood or anxiety so far.  Chronic OCD as noted and residual depression and chronic fatigue.  04/21/2022 appointment with the following noted: Patient received Spravato 84 mg today.  She tolerated it well without any unusual headache, nausea or vomiting or other somatic symptoms.  Dissociation did occur and she gradually saw resolution over the 2-hour period of observation.  She does not typically find the dissociation very strong. No SE complaints with  meds. She is still depressed and still has OCD of course but is improved with the Spravato.   She has questions about the dosing of lorazepam. She tends to have negative anxious thoughts at night.  This tends to interfere with her ability to go to sleep.  She is getting about 8 to 9 hours of sleep.  She is tolerating the meds without excessive sedation and does not nap during the day.  05/06/22 appt noted: Patient received Spravato 84 mg today.  She tolerated it well without any unusual headache, nausea or vomiting or other somatic symptoms.  Dissociation did occur and she gradually saw resolution over the 2-hour period of observation.  She does not typically find the dissociation very strong. No SE complaints with meds. She is still depressed and still has OCD of course but is improved with the Spravato.   Had some questions about timing of dosing of fluvoxamine and Auvelity. OCD is not quite as time consuming.  Sleep and eating are the same.   No SE meds.  05/25/22 appt noted: Patient received Spravato 84 mg today.  She tolerated it well without any unusual headache, nausea or vomiting or other somatic symptoms.  Dissociation did occur and she gradually saw resolution over the 2-hour period of observation.  She does not typically find the dissociation very strong. No SE complaints with meds. She is still depressed and still has OCD of course but is improved with the Spravato.   She has less OCD when away from home and on vacation of note. Plan: Rec gradually reduce HS lorazepam to 1 mg Hs.  Can continue lorazepam 2 mg AM and 1 mg in afternoon bc of chronic anxiety and it is helpful and tolerated. She can continue temazepam 30 mg nightly.  She tends to have a lot of anxious negative thoughts at night when she is trying to go to bed  06/16/22 appt noted: Patient received Spravato 84 mg today.  She tolerated it well without any unusual headache, nausea or vomiting or other somatic symptoms.   Dissociation did occur and she gradually saw resolution over the 2-hour period of observation.  She does not typically find the dissociation very strong. No SE complaints with meds. She is still depressed and still has OCD of course but is improved with the Spravato.   She has less OCD when away from home and on vacation of note. She is tolerating the medications.  She has continued current medications. 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  She still receives benefit from [place].  Her depression and OCD are better with the Spravato.  She thinks she is getting additional benefit with the switch from Wellbutrin to [place].  07/04/2022 appointment noted: Reports she developed a rash on her face from [facility] and feels like she is allergic to it.  She stopped it and went back to Wellbutrin 450 mg every morning.  The rash has cleared up.  She did not require any medical attention and did not have shortness of breath. Overall her depression and OCD are about the same as they have been.  She did not notice a substantial difference from the brief treatment with Auvelity but she understands she did not take a full course.  She is tolerating the current medicines well. Current meds fluvoxamine 400 mg daily, Wellbutrin XL 450 mg daily, lorazepam 1 to 2 mg in the morning and 1 to 2 mg at night and 1 mg in the afternoon, temazepam 30 mg nightly. She wants to continue the Spravato because she feels it has been helpful for both her depression and her racing OCD  07/18/22 appt noted: Patient received Spravato 84 mg today.  She tolerated it well without any unusual headache, nausea or vomiting or other somatic symptoms.   Dissociation did occur and she gradually saw resolution over the 2-hour period of observation.  She does not typically find the dissociation very strong. No SE complaints with meds. She is still depressed and still has OCD of course but is improved with the Spravato.  Rash better off Auvelity and back on Welllbutrin XL 450 mg AM, fluvoxamine 400 mg daily.  08/15/22 appt noted: Current psych meds: Wellbutrin XL 450 mg AM, fluvoxamine 100 mg in AM and 300 mg HS, lorazepam 1 mg 1-2 mg in the AM and HS and 1 tablet prn midday for anxiety, temazepam 30 mg HS Patient received Spravato 84 mg today.  She tolerated it well without any unusual headache, nausea or vomiting or other somatic symptoms.  Dissociation did occur and she gradually saw resolution over the 2-hour period of observation.  She does not typically find the dissociation very strong. No SE complaints with meds. She has a great deal of stress dealing with her family.  Disc brother's ongoing mania and difficulty getting him help and the stress he causes for the family. She wants to continue Spravato through this very stressful holdicay season and reevaluate the frequency after the New Year.  09/12/22 appt noted: Current psych meds: Wellbutrin XL 450 mg AM, fluvoxamine 100 mg in AM and 300 mg HS, lorazepam 1 mg 1-2 mg in the AM and HS and 1 tablet prn midday for anxiety, temazepam 30 mg HS Patient received Spravato 84 mg today.  She tolerated it well without any unusual headache, nausea or vomiting or other somatic symptoms.  Dissociation did occur and she gradually saw resolution over the 2-hour period of observation.  She does not typically find the dissociation very strong. No SE complaints with meds. She has a great deal of stress dealing with her family.  Disc brother's ongoing mania and  difficulty getting him help and the stress he causes for the family. She wants to continue Spravato through this very stressful holdicay season and  reevaluate the frequency after the New Year.  Chronically easily overwhelmed with family. Complaining of HA and history migraine.  Asks for increase imitrex and disc preventatives like propranolol ER  09/26/22 appt noted: Current psych meds: Wellbutrin XL 450 mg AM, fluvoxamine 100 mg in AM and 300 mg HS, lorazepam 1 mg 1-2 mg in the AM and HS and 1 tablet prn midday for anxiety, temazepam 30 mg HS Patient received Spravato 84 mg today.  She tolerated it well without any unusual headache, nausea or vomiting or other somatic symptoms.  Dissociation did occur and she gradually saw resolution over the 2-hour period of observation.  She does not typically find the dissociation very strong. No SE complaints with meds. She has a great deal of stress dealing with her family.  This is ongoing The holidays are much more stressful DT family problems.  She is noting OCD is much worse over the last couple of week.  Depression is better with Spravato. Needed higher dose meds for migraine.   10/03/22 appt noted: Current psych meds: Wellbutrin XL 450 mg AM, fluvoxamine 100 mg in AM and 300 mg HS, lorazepam 1 mg 1-2 mg in the AM and HS and 1 tablet prn midday for anxiety, temazepam 30 mg HS Patient received Spravato 84 mg today.  She tolerated it well without any unusual headache, nausea or vomiting or other somatic symptoms.  Dissociation did occur and she gradually saw resolution over the 2-hour period of observation.  She does not typically find the dissociation very strong. No SE complaints with meds. She has now realized that the rash she previously previously attributed to [facility] was not related.  She is interested may be retrying that after the holidays.  She is tolerating medications otherwise. The holidays remain chronically stressful to her due to family dynamic problems which cause her to consistently feel stuck.  Under more stress her OCD is worse.  She will have a tendency to have crying spells.  The  depression and OCD are still improved with Spravato as compared to before.  11/15/22 appt noted: Current psych meds: Wellbutrin XL 450 mg AM, fluvoxamine 100 mg in AM and 300 mg HS, lorazepam 1 mg 1-2 mg in the AM and HS and 1 tablet prn midday for anxiety, temazepam 30 mg HS Patient received Spravato 84 mg today.  She tolerated it well without any unusual headache, nausea or vomiting or other somatic symptoms.  Dissociation did occur and she gradually saw resolution over the 2-hour period of observation.  She does not typically find the dissociation very strong. No SE complaints with meds. Continues to feel depressed and overwhelmed by family problems including her brother's mania and recent eviction and commitment.  Chronic OCD worse when stressed.  No SI.  Tolerating meds. Plan: Per her request continue Wellbutrin XL 450 mg every morning. She has come to the realization that the rash she had previously attributed to [facility] was not related.  She is interested in perhaps retrying Auvelity. There are few alternative medication options that remain.  01/11/23 appt noted: Current psych meds: Wellbutrin XL 150 mg BID and started Auvelity 1 AM, fluvoxamine 100 mg in AM and 300 mg HS, lorazepam 1 mg 1-2 mg in the AM and HS and 1 tablet prn midday for anxiety, temazepam 30 mg HS Patient received Spravato 84 mg  today.  She tolerated it well without any unusual headache, nausea or vomiting or other somatic symptoms.  Dissociation did occur and she gradually saw resolution over the 2-hour period of observation.  She does not typically find the dissociation very strong. No SE complaints with meds. Trouble getting to sessions lately DT transportation problems.   Continues to feel depressed and overwhelmed by OCD and family.  Feels she needs toevery other week Spravato bc it helps for a couple of weeks and then seems to wear off.  Struggleing with OCD and depression both of which are eased by Spravato. Less  crying with Auvelity.  02/07/23 appt noted: Current psych meds: Wellbutrin XL 150 mg BID and started Auvelity 1 AM, fluvoxamine 100 mg in AM and 300 mg HS, lorazepam 1 mg 1-2 mg in the AM and HS and 1 tablet prn midday for anxiety, temazepam 30 mg HS Patient received Spravato 84 mg today.  She tolerated it well without any unusual headache, nausea or vomiting or other somatic symptoms.  Dissociation did occur and she gradually saw resolution over the 2-hour period of observation.  She does not typically find the dissociation very strong. No SE complaints with meds. Trouble getting to sessions lately DT transportation problems.  This is a problem ongoing and thinks she might need to pause Spravato bc won't be able to get her for at least 3 weeks. She is holding pretty steady with a moderate level of anxiety and depression ongoing and chronic.    03/20/23 appt noted: Current psych meds: Wellbutrin XL 150 mg BID and started Auvelity 1 AM, fluvoxamine 100 mg in AM and 300 mg HS, lorazepam 1 mg 1-2 mg in the AM and HS and 1 tablet prn midday for anxiety, temazepam 30 mg HS Patient received Spravato 84 mg today.  She tolerated it well without any unusual headache, nausea or vomiting or other somatic symptoms.  Dissociation did occur and she gradually saw resolution over the 2-hour period of observation.  She does not typically find the dissociation very strong. No SE complaints with meds. She is trying to get back into more regular Spravato administration.  Family issues and transportation problems that led to her missing Spravato.  She feels more depressed without regular Spravato.  Her OCD is chronic but also worse when she misses Spravato.  She does not want any medication changes.  Previous psych med trials include Prozac, paroxetine, sertraline, fluvoxamine, venlafaxine, Anafranil with no response,  Wellbutrin, , Viibryd, Trintellix 10 1 month NR Auvelity BID  Geodon,  risperidone, Rexulti, Abilify,   Seroquel, Latuda 40 mg with irritability.  lamotrigine lithium,  BuSpar, Namenda,  pramipexole with no response, and Topamax, pindolol  ECT-MADRS    Flowsheet Row Office Visit from 06/29/2021 in [facility]  MADRS Total Score 36      Flowsheet Row Admission (Discharged) from 06/11/2021 in [place] [facility]  C-SSRS RISK CATEGORY No Risk        Review of Systems:  Review of Systems  Constitutional:  Positive for fatigue.  Cardiovascular:  Negative for palpitations.  Musculoskeletal:  Positive for arthralgias, back pain, gait problem and neck pain. Negative for joint swelling.  Neurological:  Positive for weakness and headaches. Negative for tremors.  Psychiatric/Behavioral:  Positive for dysphoric mood. Negative for suicidal ideas. The patient is nervous/anxious.     Medications: I have reviewed the patient's current medications.  Current Outpatient Medications  Medication Sig Dispense Refill   Abaloparatide (TYMLOS)  3120 MCG/1.56ML SOPN Inject into the skin.     Azelastine-Fluticasone 137-50 MCG/ACT SUSP Place 1-2 sprays into both nostrils daily.     baclofen (LIORESAL) 10 MG tablet Take 20 mg by mouth at bedtime as needed for muscle spasms.     buPROPion (WELLBUTRIN XL) 150 MG 24 hr tablet TAKE 3 TABLETS BY MOUTH DAILY 270 tablet 1   dicyclomine (BENTYL) 10 MG capsule Take 10 mg by mouth daily.     docusate sodium (COLACE) 100 MG capsule Take 1 capsule (100 mg total) by mouth 2 (two) times daily. (Patient taking differently: Take 100 mg by mouth daily.) 10 capsule 0   Esketamine HCl, 84 MG Dose, (SPRAVATO, 84 MG DOSE,) 28 MG/DEVICE SOPK USE 3 SPRAYS IN EACH NOSTRIL EVERY WEEK 3 each 5   fexofenadine (ALLEGRA) 180 MG tablet Take 180 mg by mouth daily.     fluvoxaMINE (LUVOX) 100 MG tablet TAKE 1 TABLET IN THE AM AND 3 TABLETS AT NIGHT 120 tablet 0   hydrocortisone (ANUSOL-HC) 2.5 % rectal cream Place rectally 2 (two) times daily. x 7-14  days 30 g 0   ketotifen (ZADITOR) 0.025 % ophthalmic solution Place 3 drops into both eyes at bedtime.     LORazepam (ATIVAN) 1 MG tablet TAKE 1-2 IN THE AM AND 1-2 TABLETS EVERY NIGHT AT BEDTIME AND 1 TABLET IN AFTERNOON WHEN NEEDED FOR ANXIETY AND SLEEP 150 tablet 1   magnesium gluconate (MAGONATE) 500 MG tablet Take 500 mg by mouth daily.     MIBELAS 24 FE 1-20 MG-MCG(24) CHEW Chew 1 tablet by mouth at bedtime as needed (bowel regularity).     Multiple Vitamins-Minerals (ADULT GUMMY PO) Take 2 tablets by mouth in the morning.     nitrofurantoin (MACRODANTIN) 100 MG capsule Take 100 mg by mouth as needed (For urinary tract infection.).      oxyCODONE-acetaminophen (PERCOCET/ROXICET) 5-325 MG tablet Take 1-2 tablets by mouth every 6 (six) hours as needed for severe pain. 50 tablet 0   polyethylene glycol (MIRALAX / GLYCOLAX) packet Take 17 g by mouth daily as needed for mild constipation. 14 each 0   propranolol ER (INDERAL LA) 60 MG 24 hr capsule TAKE 1 CAPSULE BY MOUTH EVERY DAY 30 capsule 5   psyllium (METAMUCIL) 58.6 % powder Take 1 packet by mouth daily as needed (constipation).     SUMAtriptan (IMITREX) 100 MG tablet TAKE 1 TABLET (100 MG TOTAL) BY MOUTH EVERY 2 (TWO) HOURS AS NEEDED FOR MIGRAINE. MAY REPEAT IN 2 HOURS IF HEADACHE PERSISTS OR RECURS. 10 tablet 1   temazepam (RESTORIL) 30 MG capsule Take 1 capsule by mouth at bedtime as needed for sleep 30 capsule 2   Vitamin D-Vitamin K (VITAMIN K2-VITAMIN D3 PO) Take 1-2 sprays by mouth daily.     No current facility-administered medications for this visit.    Medication Side Effects: None   Allergies:  Allergies  Allergen Reactions   Hydrocodone Itching   Sulfamethoxazole-Trimethoprim Itching   Dust Mite Extract Other (See Comments)    Sneezing, watery eyes, runny nose   Latex Itching   Other Other (See Comments)    PT IS ALLERGIC TO CAT DANDER AND RAGWEED - Sneezing, watery eyes, runny nose    Pollen Extract Other (See  Comments)    Sneezing, watery eyes, runny nose     Past Medical History:  Diagnosis Date   Abnormal Pap smear 2011   hpv/mild dysplasia,cin1   Anxiety    Cerebral palsy (HCC)  right arm/leg   Cystocele    Depression    Headache    Neuromuscular disorder (HCC)    Cerebral Palsy   OCD (obsessive compulsive disorder)    Osteoporosis    Uterine prolaps     Family History  Problem Relation Age of Onset   Cancer Father        skin AND LUNG   Alcohol abuse Sister        CRACK COCAINE    Social History   Socioeconomic History   Marital status: Married    Spouse name: Not on file   Number of children: Not on file   Years of education: Not on file   Highest education level: Not on file  Occupational History   Not on file  Tobacco Use   Smoking status: Never   Smokeless tobacco: Never  Substance and Sexual Activity   Alcohol use: Not Currently    Comment: OCCASIONAL beer   Drug use: No   Sexual activity: Yes    Birth control/protection: Pill    Comment: LOESTRIN 24 FE  Other Topics Concern   Not on file  Social History Narrative   Not on file   Social Determinants of Health   Financial Resource Strain: Not on file  Food Insecurity: Not on file  Transportation Needs: Not on file  Physical Activity: Not on file  Stress: Not on file  Social Connections: Not on file  Intimate Partner Violence: Not on file    Past Medical History, Surgical history, Social history, and Family history were reviewed and updated as appropriate.   Please see review of systems for further details on the patient's review from today.   Objective:   Physical Exam:  LMP  (LMP Unknown)   Physical Exam Constitutional:      General: She is not in acute distress. Neurological:     Mental Status: She is alert and oriented to person, place, and time.     Cranial Nerves: No dysarthria.     Motor: Weakness present.     Gait: Gait abnormal.  Psychiatric:        Attention and  Perception: Attention and perception normal.        Mood and Affect: Mood is anxious and depressed. Affect is not labile or tearful.        Speech: Speech normal.        Behavior: Behavior normal. Behavior is cooperative.        Thought Content: Thought content normal. Thought content is not delusional. Thought content does not include homicidal or suicidal ideation. Thought content does not include suicidal plan.        Cognition and Memory: Cognition and memory normal. Cognition is not impaired.        Judgment: Judgment normal.     Comments: Insight intact Ongoing OCD remains fairly severe but less anxious Checking compulsions up to 2 hours daily but improved noticeably Chronic depression persistent but better with Spravato       Lab Review:     Component Value Date/Time   NA 138 06/11/2021 0606   K 4.0 06/11/2021 0606   CL 107 06/11/2021 0606   CO2 26 06/11/2021 0606   GLUCOSE 90 06/11/2021 0606   BUN 18 06/11/2021 0606   CREATININE 0.81 06/11/2021 0606   CALCIUM 9.4 06/11/2021 0606   PROT 6.5 06/11/2021 0606   ALBUMIN 3.3 (L) 06/11/2021 0606   AST 17 06/11/2021 0606   ALT 14 06/11/2021 0606  ALKPHOS 141 (H) 06/11/2021 0606   BILITOT 0.2 (L) 06/11/2021 0606   GFRNONAA >60 06/11/2021 0606   GFRAA >60 07/09/2016 0438       Component Value Date/Time   WBC 5.8 06/11/2021 0606   RBC 4.12 06/11/2021 0606   HGB 12.5 06/11/2021 0606   HCT 39.7 06/11/2021 0606   PLT 299 06/11/2021 0606   MCV 96.4 06/11/2021 0606   MCH 30.3 06/11/2021 0606   MCHC 31.5 06/11/2021 0606   RDW 13.9 06/11/2021 0606   LYMPHSABS 1.9 06/11/2021 0606   MONOABS 0.5 06/11/2021 0606   EOSABS 0.1 06/11/2021 0606   BASOSABS 0.0 06/11/2021 0606    No results found for: "POCLITH", "LITHIUM"   No results found for: "PHENYTOIN", "PHENOBARB", "VALPROATE", "CBMZ"   .res Assessment: Plan:    Tashana was seen today for follow-up, depression and anxiety.  Diagnoses and all orders for this  visit:  Recurrent major depression resistant to treatment [facility])  Mixed obsessional thoughts and acts  Social anxiety disorder  Migraine without aura and without status migrainosus, not intractable  Insomnia due to mental condition    Both Dx are TR and marked.  Impaired function but less so with Spravato re: depression..   She is receiving Spravato 84 mg weekly and marked improvement in the depression..  she feels it also helps OCD somewhat.  However still easily overwhelmed with low stress tolerance.  The OCD is improved with the increase in fluvoxamine and with Spravato.  Spends 2 hours daily and checking compulsions on her worst days but better when she travels.  She has been on higher doses of fluvoxamine above the usual max of 400 mg daily in the past.    Disc SE. She would like to continue Luvox 400 mg daily again    She is tolerating the meds well  Continue  Luvox back to 400 mg nightly as of January 2023. Disc dosing higher than usual.  She feels this is increase has helped more with OCD which remains chronically severe.  Contiinue Auvelity 1 AM and Wellbutrin XL 150 BID.  Option increase Auvelity BID and reduce Wellbutrin to 1 AM. There are few other alternative medication options that remain.    Disc Spravato DT TRD incl details and SE. Disc dosing and duration.  Pt with severe depression MADRS 36 on 06/29/21  Patient was administered Spravato 84 mg intranasally dosage today.  The patient experienced the typical dissociation which gradually resolved over the 2-hour period of observation.  There were no complications.  Specifically the patient did not have nausea or vomiting or headache.  Blood pressures remained within normal ranges at the 40-minute and 2-hour follow-up intervals.  By the time the 2-hour observation period was met the patient was alert and oriented and able to exit without assistance. She tends to have lingering sedative effects but not severe. .  See nursing  note for further details. She is ready to be more consistent.  Didn't feel as well when off spravato.  We discussed the short-term risks associated with benzodiazepines including sedation and increased fall risk among others.  Discussed long-term side effect risk including dependence, potential withdrawal symptoms, and the potential eventual dose-related risk of dementia.  But recent studies from 2020 dispute this association between benzodiazepines and dementia risk. Newer studies in 2020 do not support an association with dementia. Disc this is high dose and not ideal.  Also disc risk combining it with temazepam. Rec gradually reduce HS lorazepam to 1 mg  Hs.  Can continue lorazepam 2 mg AM and 1 mg in afternoon bc of chronic anxiety and it is helpful and tolerated. She can continue temazepam 30 mg nightly.  She tends to have a lot of anxious negative thoughts at night when she is trying to go to bed.  She is trying to reduce the dose.  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  Complaining of HA and history migraine.  Asks for increase imitrex and disc preventatives like propranolol ER Ok increase imitrex to 100 mg prn migraine and propranolol ER for migraine prevention.  Supportive therapy dealing with some of the recent stressors including fbrother's mania and conflict.  No other med changes today  Follow-up every other week and try to be more consistent. That is her plan.     Meredith Staggers, MD, DFAPA  Please see After Visit Summary for patient specific instructions.  Future Appointments  Date Time Provider Department Center  05/09/2023  4:00 PM Robley Fries, PhD [facility] None  05/23/2023 10:00 AM Robley Fries, PhD [facility] None  06/06/2023 11:00 AM Robley Fries, PhD [facility] None  06/20/2023 11:00 AM Robley Fries, PhD [facility] None  07/04/2023 11:00 AM Robley Fries, PhD [facility] None  07/18/2023 11:00 AM Robley Fries, PhD [facility] None  08/01/2023 11:00 AM  Robley Fries, PhD [facility] None    No orders of the defined types were placed in this encounter.    -------------------------------

## 2023-04-03 ENCOUNTER — Other Ambulatory Visit: Payer: Self-pay | Admitting: Psychiatry

## 2023-04-03 ENCOUNTER — Ambulatory Visit (INDEPENDENT_AMBULATORY_CARE_PROVIDER_SITE_OTHER): Payer: 59 | Admitting: Psychiatry

## 2023-04-03 ENCOUNTER — Encounter: Payer: Self-pay | Admitting: Psychiatry

## 2023-04-03 ENCOUNTER — Ambulatory Visit: Payer: Self-pay

## 2023-04-03 VITALS — BP 104/76 | HR 67

## 2023-04-03 DIAGNOSIS — F339 Major depressive disorder, recurrent, unspecified: Secondary | ICD-10-CM

## 2023-04-03 DIAGNOSIS — F5105 Insomnia due to other mental disorder: Secondary | ICD-10-CM

## 2023-04-03 DIAGNOSIS — F422 Mixed obsessional thoughts and acts: Secondary | ICD-10-CM

## 2023-04-03 DIAGNOSIS — G43009 Migraine without aura, not intractable, without status migrainosus: Secondary | ICD-10-CM

## 2023-04-03 DIAGNOSIS — F4010 Social phobia, unspecified: Secondary | ICD-10-CM | POA: Diagnosis not present

## 2023-04-03 NOTE — Progress Notes (Signed)
Casey Diaz [id number] 01/13/1966 57 y.o.    Subjective:   Patient ID:  Casey Diaz is a 57 y.o. (DOB Oct 27, 1965) female.  Chief Complaint:  Chief Complaint  Patient presents with   Follow-up   Depression   Anxiety     HPI Casey Diaz presents to the office today for follow-up of OCD and severe anxiety.     December 2019 visit the following was noted: No meds were changed. Lives in [name] and back for followup.  Sx are about the same.  Has to take meds with different sizes. Pt reports that mood is Anxious and Depressed and describes anxiety as Severe. Anxiety symptoms include: Excessive Worry, Obsessive Compulsive Symptoms:   Checking,,. Pt reports has interrupted sleep and nocturia. Pt reports that appetite is good. Pt reports that energy is no change and down slightly. Concentration is down slightly. Suicidal thoughts:  denied by patient. Loves the environment of [name] but misses some things there.  She's not able to work there.  H works there and likes it.  Struggled with not working, feels isolated and not up to task of meeting people.  Does attend a church and met a friend who's been helpful.  Leaving for [name] on 10/16/18.   04/09/2020 appointment the following is noted:  Staying another year in [name] bc Covid and other things. Last few months a lot of crying spells.  Is in menopause. Wonders about med changes though is nervous about it.  Crying spells associated with depressing thoughts more than stress or OCD.   Covid really hard on everyone and couldn't see family for 18 mos.  Family still very dysfunctional. No close friends in part due to OCD and depression. Son high Autism spectrum with ADHD and anxiety and she's with him all the time. Greater health problems with CP so more pains.   05/15/20 appt with the following noted: Casey Diaz for menopause and helps some. Still depressed.  Chronically. In [place] for 2 more weeks then to [name] for another  year. A lot of stressors lately triggering more checking and anxiety.   OCD is her CC now and seems.  Got worse DT stress.   Stressed with Asberger's son and her health.  H works a lot.  Her FOO still stress. Plan: Trintellix 10 mg 1 tablet in the morning with food and reduce fluvoxamine to 5 tablets nightly for 1 week  then reduce it to 4 tablets nightly.   07/02/20 appt with the following noted: Decided not to get Trintellix bc difficulty getting it. It is available.  There.  Wants to start it now.   Both depression and OCD are severe.  Not suicidal in intent or plan. Did not take samples with her to [name] but will be back in December. covid is worse there and travel is difficult.  Wants to reduce Wellbutrin DT dry mouth. Plan: She's afraid to reduce Luvox at this time DT fear of worsening OCD.  But will consider. Trintellix 10 mg 1 tablet in the morning with food and reduce fluvoxamine to 5 tablets nightly for 1 week  then reduce it to 4 tablets nightly. Also reduce Wellbutrin XL to 300 mg daily.    9-13 2022 appointment with the following noted: Back in [place] since July 14.  Broke arm a month ago and surgery.  It's all been rough adjustment.   Casey Diaz has cancer on his face and Casey Diaz fell taking him to the doctor.  Misses the water  and weather of [place].   Cry a lot more since menopause. Still depression and anxiety and OCD.  Asks about ketamine. On Wellbutrin 300, Luvox 300.  No Trintellix. Added Ativan 2 mg AM and HS and it helps.  More likely to get upset at night. Plan: Increase Luvox back to 400 mg daily.  She thinks she's worse on less. Continue Wellbutrin XL to 300 mg daily. 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  She tolerated it well without unusual nausea or vomiting headache or other somatic symptoms.  She did have the expected dissociation which gradually resolved over the course  of the 2-hour period of observation.  She was a little concerned about her balance given her cerebral palsy but has not noted unusual or unexpected problems.  She is motivated to can continue Spravato in hopes of reducing her depressive symptoms. 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  She also has treatment resistant OCD which is partially managed with medications but is still quite disabling.  She is tolerating the medications well.  She is sleeping adequately.  Her appetite is adequate.  She is not having suicidal thoughts.  She continues to wish for a better treatment for OCD that would give her some relief.  09/30/2021 appointment with the following noted: She received her first dose of Spravato 84 mg intranasally today.  She tolerated it well without unusual nausea, vomiting, or other somatic symptoms.  Dissociation as expected did occur and gradually resolved over the 2-hour period of observation.  She did have a mild headache today with the treatment and received ibuprofen 600 mg at her request.  We will follow this to see if it is a pattern Patient is still depressed.  She said she was late with her medicine today and today was a particularly depressing day.  However she notes that the Spravato has lifted her mood considerably even today.  She is hopeful that it will continue to be helpful.  No suicidal thoughts.  She has ongoing chronic anxiety and OCD at baseline.  10/04/21 appt noted: Patient received Spravato 84 mg for the second time today.  She tolerated it well without any unusual headache, nausea or vomiting or other somatic symptoms.  Dissociation did occur and she gradually [employer] resolution over the 2-hour period of observation. She did not have any unusual problems after she left the office last Spravato administration.  She did not have any specific problems with balance or walking.  She is at increased risk of that difficulty because of cerebral  palsy.  So far she has not noticed much mood effect from the medication beyond the first day of receiving it.  However she would like to continue Spravato in hopes of getting the antidepressant effect that is desired. Stress dealing with mother's behavior at party pt hosted.  Guilt over it.  10/07/2021 appointment noted: Patient received Spravato 84 mg for the second time today.  She tolerated it well without any unusual headache, nausea or vomiting or other somatic symptoms.  Dissociation did occur and she gradually Kelley resolution over the 2-hour period of observation. She still is not sure about the antidepressant effect of Spravato.  Events over the holidays and demands, make it difficult to assess.  She still notes that the OCD tends to worsen the depression and vice versa.  She tolerates the Spravato well and  wants to continue the trial.  10/15/2021 appointment with the following noted: Patient received Spravato 84 mg for the second time today.  She tolerated it well without any unusual headache, nausea or vomiting or other somatic symptoms.  Dissociation did occur and she gradually [place] resolution over the 2-hour period of observation. Patient says it was somewhat difficult to evaluate the effect of the Spravato.  It was scheduled to be twice weekly for 4 weeks consecutively but the holidays have interfered with that administration.  She asked what specifically should be she should be looking for in order to assess improvement.  That was discussed.  The OCD is unchanged and the depression so far is not significantly different.  She still tolerates meds.  There have been no recent med changes  10/19/2021 appt noted: Patient received Spravato 84 mg for the second today.  She tolerated it well without any unusual headache, nausea or vomiting or other somatic symptoms.  Dissociation did occur and she gradually saw resolution over the 2-hour period of observation.   10/21/2021 appointment noted: Patient  received Spravato 84 mg today.  She tolerated it well without any unusual headache, nausea or vomiting or other somatic symptoms.  Dissociation did occur and she gradually saw resolution over the 2-hour period of observation.  She feels better than last week.  She is not as depressed and down.  She is still dealing with grief around the death of her cousin that was unexpected.  It is still difficult to tell how much the Spravato was doing but she is hopeful.  Anxiety is still present with the OCD.  She is not having suicidal thoughts.  She is not hopeless.  She wants to continue treatment.  10/25/2021 appointment with the following noted: Patient received Spravato 84 mg today.  She tolerated it well without any unusual headache, nausea or vomiting or other somatic symptoms.  Dissociation did occur and she gradually saw resolution over the 2-hour period of observation.  She does not typically find the dissociation very strong. She is beginning to think the Spravato is helping somewhat with the depression.  It has been difficult to tell with the holidays intervening as well as the death of her cousin.  She has not been able to get Spravato twice weekly for 4 weeks straight as typically planned.  However she is hopeful.  The OCD remains significant.  She still has a tendency to think very negatively.  She is not suicidal.  10/28/2021 appointment with the following noted: Patient received Spravato 84 mg today.  She tolerated it well without any unusual headache, nausea or vomiting or other somatic symptoms.  Dissociation did occur and she gradually saw resolution over the 2-hour period of observation.  She does not typically find the dissociation very strong. She is feeling more hopeful about the administration of Spravato.  She is having less depression she believes.  Still not dramatically different.  She still has a tendency to have a lot of anxiety and rumination and OCD.  She is not suicidal.  She is eager to  continue the Spravato.  11/01/2021 appointment with the following noted: Patient received Spravato 84 mg today.  She tolerated it well without any unusual headache, nausea or vomiting or other somatic symptoms.  Dissociation did occur and she gradually saw resolution over the 2-hour period of observation.  She does not typically find the dissociation very strong. She is continuing to see a little bit of improvement in depression with Spravato.  The anxiety remains but may be not as severe.  The OCD remains markedly severe chronically.  She is not suicidal.  She is encouraged by the degree of improvement with Spravato and inability to enjoy things more and not be quite as ruminative.  11/04/2021 appt noted: Patient received Spravato 84 mg today.  She tolerated it well without any unusual headache, nausea or vomiting or other somatic symptoms.  Dissociation did occur and she gradually saw resolution over the 2-hour period of observation.  She does not typically find the dissociation very strong. No SE complaints with meds. She continues to feel hopeful about the Spravato.  She has less depression.  Because of a number of factors she is uncertain of the full benefit but thinks she is somewhat less depressed.  Her anxiety and OCD remain significant but a little better.  She is tolerating the medications and does not desire medicine change.  She is not currently complaining of insomnia.   11/08/2021 appointment the following noted: Patient received Spravato 84 mg today.  She tolerated it well without any unusual headache, nausea or vomiting or other somatic symptoms.  Dissociation did occur and she gradually saw resolution over the 2-hour period of observation.  She does not typically find the dissociation very strong. No SE complaints with meds. She feels the Spravato is helping somewhat.  She would like to see a greater effect.  However she is able to enjoy things.  She is productive at home.  She would like  to see a lifting of a degree of sadness that remains.  The anxiety and OCD remained largely unchanged.  She wondered about the dosing of Wellbutrin 300 mg a day and Luvox 300 mg a day and possible increases.  She has been at higher doses in the past.  She plans to start water therapy for her weakness and for her shoulder.  11/11/2021 appointment with the following noted: Patient received Spravato 84 mg today.  She tolerated it well without any unusual headache, nausea or vomiting or other somatic symptoms.  Dissociation did occur and she gradually saw resolution over the 2-hour period of observation.  She does not typically find the dissociation very strong. No SE complaints with meds. She feels the Spravato is clearly helping the depression.  She would like to see a more significant effect.  She is still having trouble thinking positive. Her energy is fair.  Concentration is good except for the problem with chronic obsessions. She has been taking Wellbutrin 300 mg in Luvox 300 mg for quite some time but has taken higher doses in the past.  We discussed that.  She would like to try higher doses in order to get a better effect if possible. We just increased the doses a couple of days ago.  No effect yet.  11/15/2021 appointment with the following noted: Patient received Spravato 84 mg today.  She tolerated it well without any unusual headache, nausea or vomiting or other somatic symptoms.  Dissociation did occur and she gradually saw resolution over the 2-hour period of observation.  She does not typically find the dissociation very strong. No SE complaints with meds. The patient is now convinced that the Spravato is helping the depression.  She would like to continue twice weekly Spravato this week if possible.  She has tolerated the increase in Wellbutrin to 450 mg daily and the increase and fluvoxamine to 400 mg daily without complications thus far.  The OCD and anxiety feed the depression to  some  extent. She spends approximately 2 hours daily with checking compulsions due to obsessions about causing harm to others.  For example fearing that when she has hit a pot hole that she may have hit a person and going back to check.  Checking corners and rooms out of fear that she may have harmed someone.  Other various checking compulsions.  She is hoping the increase in fluvoxamine to 400 mg will reduce that over the weeks to come.  She is not seeing a significant difference with the addition of the Spravato though she understands that was not expected.  She is more productive at home and more motivated and able to enjoy things more fully as a result of the Spravato treatment.  She is tolerating the medication  11/18/2021 appointment with the following noted: Patient received Spravato 84 mg today.  She tolerated it well without any unusual headache, nausea or vomiting or other somatic symptoms.  Dissociation did occur and she gradually saw resolution over the 2-hour period of observation.  She does not typically find the dissociation very strong. No SE complaints with meds. She clearly believes the Spravato has been helpful for the depression.  She wonders whether to continue to treatments weekly or to cut back to 1 weekly.  She would like to continue twice weekly in hopes of getting additional improvement in the depression because it is not resolved but it is difficult to get here twice a week in terms of arranging rides. She is recently increased Wellbutrin XL to 450 mg daily and fluvoxamine to 400 mg daily but they have not had time to have an official effect.  She is tolerating that well.  She is tolerating meds overwork overall well. The OCD remains the same as noted on 11/15/2021  11/25/21 appt noted: Patient received Spravato 84 mg today.  She tolerated it well without any unusual headache, nausea or vomiting or other somatic symptoms.  Dissociation did occur and she gradually saw resolution over the  2-hour period of observation.  She does not typically find the dissociation very strong. No SE complaints with meds. She thinks the increase in Wellbutrin and Luvox have been potentially helpful for depression and OCD respectively.  It has been too early to see the full effect.  She is sleeping and eating well.  She is functioning at home.  She still spends a lot of time that is about 2 hours a day dealing with compulsive behaviors.  12/02/21 appt noted: Patient received Spravato 84 mg today.  She tolerated it well without any unusual headache, nausea or vomiting or other somatic symptoms.  Dissociation did occur and she gradually saw resolution over the 2-hour period of observation.  She does not typically find the dissociation very strong. No SE complaints with meds. Several losses and stressors recently that affect her sense of mood. However still sees significant benefit from the Spravato for her depression.  Wants to continue it. Suspect early  some benefit from the increased Wellbutrin for depression and Luvox for OCD. Tolerating meds. No complaints about the meds. Sleeping and eating well.  No new health concerns.  12/09/21 appt noted: Patient received Spravato 84 mg today.  She tolerated it well without any unusual headache, nausea or vomiting or other somatic symptoms.  Dissociation did occur and she gradually saw resolution over the 2-hour period of observation.  She does not typically find the dissociation very strong. No SE complaints with meds. Seeing noticeable improvement from increase fluvoxamine to  400 mg daily.  Tolerating meds without concerns over them. Depression is stable with residual sx of easy guilt and easily stressed.  OCD contributes to depression but depression is not severe with less crying spells.  Productive at home with chores.  Enjoyed recent birthday.  Sleeping good. No new concerns.  12/23/2021 appointment noted: Patient received Spravato 84 mg today.  She  tolerated it well without any unusual headache, nausea or vomiting or other somatic symptoms.  Dissociation did occur and she gradually saw resolution over the 2-hour period of observation.  She does not typically find the dissociation very strong. No SE complaints with meds. Seeing noticeable improvement from increase fluvoxamine to 400 mg daily.  Tolerating meds without concerns over them. Her depression is somewhat improved with the Spravato.  She also feels generally a little lighter.  She is more motivated.  She is less overwhelmed by guilt.  The OCD is gradually improving but is still quite time-consuming as noted before.  She is sleeping well.  No side effects  12/30/2021 appointment with the following noted: Patient received Spravato 84 mg today.  She tolerated it well without any unusual headache, nausea or vomiting or other somatic symptoms.  Dissociation did occur and she gradually saw resolution over the 2-hour period of observation.  She does not typically find the dissociation very strong. No SE complaints with meds. Seeing noticeable improvement from increase fluvoxamine to 400 mg daily.  Tolerating meds without concerns over them. She is confident of her the improvement seen with Spravato.  She is less hopeless.  Guilt is marked remarkably improved.  She is not having any thoughts of death or dying.  She is more motivated for activities such as exercise which she is recently started.  She is sleeping well. The OCD remains severe but it is improving somewhat with the increase in fluvoxamine.  It is still consuming a couple hours per day.  01/10/22 apravato 84 admin  01/24/22 appt noted: Patient received Spravato 84 mg today.  She tolerated it well without any unusual headache, nausea or vomiting or other somatic symptoms.  Dissociation did occur and she gradually saw resolution over the 2-hour period of observation.  She does not typically find the dissociation very strong. No SE  complaints with meds. Very tearful today.  Feels like she has been suppressing emotion in the Spravato caused it to be released.  Discussed some stressors.  Overall still feels the medicine is helpful.  She has missed some of the scheduled Spravato treatments that were intended to be weekly due to circumstances beyond her control.  She is still struggling with OCD as previously noted but does believe the medications are helpful. Plan no med changes  01/31/2022 received Spravato 84 mg today  02/09/2022 appointment with the following noted: Patient received Spravato 84 mg today.  She tolerated it well without any unusual headache, nausea or vomiting or other somatic symptoms.  Dissociation did occur and she gradually saw resolution over the 2-hour period of observation.  She does not typically find the dissociation very strong. No SE complaints with meds. Spravato clearly helps depression and OCD but easily gets overwhelmed and tearful with fairly routine stressors.  Tolerating meds. Sleep and appetite is OK Asks to increase lorazepam to 2 mg AM and HS and 46m afternoon  02/16/22 appt noted: Patient received Spravato 84 mg today.  She tolerated it well without any unusual headache, nausea or vomiting or other somatic symptoms.  Dissociation did occur and she  gradually saw resolution over the 2-hour period of observation.  She does not typically find the dissociation very strong. No SE complaints with meds. She has chronic depesssion and OCD but is improved with Spravato, both dx versus before.  She has continued Luvox 400 mg and Wellbutrin 450 mg and is tolerating it.  Chronically easily stressed.  Tolerating all meds.  Doesn't like taking more meds.  Spending a couple hours daily with OCD.  No SI No med changes.  02/21/22 appt noted:   Doesn't like taking more meds.  Spending a couple hours daily with OCD.  No SI No med changes.  02/21/22 appt noted: Patient received Spravato 84 mg today.  She  tolerated it well without any unusual headache, nausea or vomiting or other somatic symptoms.  Dissociation did occur and she gradually saw resolution over the 2-hour period of observation.  She does not typically find the dissociation very strong. No SE complaints with meds. She has chronic depesssion and OCD but is improved with Spravato, both dx versus before.  She has continued Luvox 400 mg and Wellbutrin 450 mg and is tolerating it.  Chronically easily overwhelmed and doesn't know why.  Tolerating all meds. Wants to continue meds.  03/16/22 appt noted: Patient received Spravato 84 mg today.  She tolerated it well without any unusual headache, nausea or vomiting or other somatic symptoms.  Dissociation did occur and she gradually saw resolution over the 2-hour period of observation.  She does not typically find the dissociation very strong. No SE complaints with meds. Overall she still feels the Spravato has been helpful not only for her depression but also for her OCD which was somewhat unexpected.  OCD is still significant but it is less severe than prior to starting Spravato.  She is tolerating Luvox 400 mg and Wellbutrin 450 mg.  We discussed possible med adjustments.  03/23/22 appt noted: Patient received Spravato 84 mg today.  She tolerated it well without any unusual headache, nausea or vomiting or other somatic symptoms.  Dissociation did occur and she gradually saw resolution over the 2-hour period of observation.  She does not typically find the dissociation very strong. No SE complaints with meds. She is still depressed and still has OCD of course but is improved with the Spravato.  She is tolerating the medications well.  We had previously discussed the possibility of switching some of the Wellbutrin to [facility] and she is very interested in that in hopes of further improvement in depression and OCD.  She understands that Auvelity is not used for OCD on the label.  She is tolerating the  medications.  She is still easily overwhelmed.  She is sleeping and eating okay.. Plan: Reduce Wellbutrin XL to 300 mg AM and add Auvelity 1 tablet each AM  03/30/22 appt noted: Patient received Spravato 84 mg today.  She tolerated it well without any unusual headache, nausea or vomiting or other somatic symptoms.  Dissociation did occur and she gradually saw resolution over the 2-hour period of observation.  She does not typically find the dissociation very strong. No SE complaints with meds. She is still depressed and still has OCD of course but is improved with the Spravato.  She is tolerating the medications well.  No difference with Auvelity 1 AM so far and no SE.  Going on vacation on Saturday. 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 Sleep and appetite good. Plan: Increase Auvelity to 1 twice daily and reduce Wellbutrin to XL 150  every morning  04/14/2022 appointment with the following noted: Patient received Spravato 84 mg today.  She tolerated it well without any unusual headache, nausea or vomiting or other somatic symptoms.  Dissociation did occur and she gradually saw resolution over the 2-hour period of observation.  She does not typically find the dissociation very strong. No SE complaints with meds. She is still depressed and still has OCD of course but is improved with the Spravato.  She is tolerating the medications well.  Just increased Auvelity to BID yesterday and reduced Wellbutrin to 150 AM. No SE so far.  No change in mood or anxiety so far.  Chronic OCD as noted and residual depression and chronic fatigue.  04/21/2022 appointment with the following noted: Patient received Spravato 84 mg today.  She tolerated it well without any unusual headache, nausea or vomiting or other somatic symptoms.  Dissociation did occur and she gradually saw resolution over the 2-hour period of observation.  She does not typically find the dissociation very strong. No SE complaints with  meds. She is still depressed and still has OCD of course but is improved with the Spravato.   She has questions about the dosing of lorazepam. She tends to have negative anxious thoughts at night.  This tends to interfere with her ability to go to sleep.  She is getting about 8 to 9 hours of sleep.  She is tolerating the meds without excessive sedation and does not nap during the day.  05/06/22 appt noted: Patient received Spravato 84 mg today.  She tolerated it well without any unusual headache, nausea or vomiting or other somatic symptoms.  Dissociation did occur and she gradually saw resolution over the 2-hour period of observation.  She does not typically find the dissociation very strong. No SE complaints with meds. She is still depressed and still has OCD of course but is improved with the Spravato.   Had some questions about timing of dosing of fluvoxamine and Auvelity. OCD is not quite as time consuming.  Sleep and eating are the same.   No SE meds.  05/25/22 appt noted: Patient received Spravato 84 mg today.  She tolerated it well without any unusual headache, nausea or vomiting or other somatic symptoms.  Dissociation did occur and she gradually saw resolution over the 2-hour period of observation.  She does not typically find the dissociation very strong. No SE complaints with meds. She is still depressed and still has OCD of course but is improved with the Spravato.   She has less OCD when away from home and on vacation of note. Plan: Rec gradually reduce HS lorazepam to 1 mg Hs.  Can continue lorazepam 2 mg AM and 1 mg in afternoon bc of chronic anxiety and it is helpful and tolerated. She can continue temazepam 30 mg nightly.  She tends to have a lot of anxious negative thoughts at night when she is trying to go to bed  06/16/22 appt noted: Patient received Spravato 84 mg today.  She tolerated it well without any unusual headache, nausea or vomiting or other somatic symptoms.   Dissociation did occur and she gradually saw resolution over the 2-hour period of observation.  She does not typically find the dissociation very strong. No SE complaints with meds. She is still depressed and still has OCD of course but is improved with the Spravato.   She has less OCD when away from home and on vacation of note. She is tolerating the medications.  She has continued current medications. 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  She still receives benefit from [place].  Her depression and OCD are better with the Spravato.  She thinks she is getting additional benefit with the switch from Wellbutrin to [place].  07/04/2022 appointment noted: Reports she developed a rash on her face from [facility] and feels like she is allergic to it.  She stopped it and went back to Wellbutrin 450 mg every morning.  The rash has cleared up.  She did not require any medical attention and did not have shortness of breath. Overall her depression and OCD are about the same as they have been.  She did not notice a substantial difference from the brief treatment with Auvelity but she understands she did not take a full course.  She is tolerating the current medicines well. Current meds fluvoxamine 400 mg daily, Wellbutrin XL 450 mg daily, lorazepam 1 to 2 mg in the morning and 1 to 2 mg at night and 1 mg in the afternoon, temazepam 30 mg nightly. She wants to continue the Spravato because she feels it has been helpful for both her depression and her racing OCD  07/18/22 appt noted: Patient received Spravato 84 mg today.  She tolerated it well without any unusual headache, nausea or vomiting or other somatic symptoms.   Dissociation did occur and she gradually saw resolution over the 2-hour period of observation.  She does not typically find the dissociation very strong. No SE complaints with meds. She is still depressed and still has OCD of course but is improved with the Spravato.  Rash better off Auvelity and back on Welllbutrin XL 450 mg AM, fluvoxamine 400 mg daily.  08/15/22 appt noted: Current psych meds: Wellbutrin XL 450 mg AM, fluvoxamine 100 mg in AM and 300 mg HS, lorazepam 1 mg 1-2 mg in the AM and HS and 1 tablet prn midday for anxiety, temazepam 30 mg HS Patient received Spravato 84 mg today.  She tolerated it well without any unusual headache, nausea or vomiting or other somatic symptoms.  Dissociation did occur and she gradually saw resolution over the 2-hour period of observation.  She does not typically find the dissociation very strong. No SE complaints with meds. She has a great deal of stress dealing with her family.  Disc brother's ongoing mania and difficulty getting him help and the stress he causes for the family. She wants to continue Spravato through this very stressful holdicay season and reevaluate the frequency after the New Year.  09/12/22 appt noted: Current psych meds: Wellbutrin XL 450 mg AM, fluvoxamine 100 mg in AM and 300 mg HS, lorazepam 1 mg 1-2 mg in the AM and HS and 1 tablet prn midday for anxiety, temazepam 30 mg HS Patient received Spravato 84 mg today.  She tolerated it well without any unusual headache, nausea or vomiting or other somatic symptoms.  Dissociation did occur and she gradually saw resolution over the 2-hour period of observation.  She does not typically find the dissociation very strong. No SE complaints with meds. She has a great deal of stress dealing with her family.  Disc brother's ongoing mania and  difficulty getting him help and the stress he causes for the family. She wants to continue Spravato through this very stressful holdicay season and  reevaluate the frequency after the New Year.  Chronically easily overwhelmed with family. Complaining of HA and history migraine.  Asks for increase imitrex and disc preventatives like propranolol ER  09/26/22 appt noted: Current psych meds: Wellbutrin XL 450 mg AM, fluvoxamine 100 mg in AM and 300 mg HS, lorazepam 1 mg 1-2 mg in the AM and HS and 1 tablet prn midday for anxiety, temazepam 30 mg HS Patient received Spravato 84 mg today.  She tolerated it well without any unusual headache, nausea or vomiting or other somatic symptoms.  Dissociation did occur and she gradually saw resolution over the 2-hour period of observation.  She does not typically find the dissociation very strong. No SE complaints with meds. She has a great deal of stress dealing with her family.  This is ongoing The holidays are much more stressful DT family problems.  She is noting OCD is much worse over the last couple of week.  Depression is better with Spravato. Needed higher dose meds for migraine.   10/03/22 appt noted: Current psych meds: Wellbutrin XL 450 mg AM, fluvoxamine 100 mg in AM and 300 mg HS, lorazepam 1 mg 1-2 mg in the AM and HS and 1 tablet prn midday for anxiety, temazepam 30 mg HS Patient received Spravato 84 mg today.  She tolerated it well without any unusual headache, nausea or vomiting or other somatic symptoms.  Dissociation did occur and she gradually saw resolution over the 2-hour period of observation.  She does not typically find the dissociation very strong. No SE complaints with meds. She has now realized that the rash she previously previously attributed to [facility] was not related.  She is interested may be retrying that after the holidays.  She is tolerating medications otherwise. The holidays remain chronically stressful to her due to family dynamic problems which cause her to consistently feel stuck.  Under more stress her OCD is worse.  She will have a tendency to have crying spells.  The  depression and OCD are still improved with Spravato as compared to before.  11/15/22 appt noted: Current psych meds: Wellbutrin XL 450 mg AM, fluvoxamine 100 mg in AM and 300 mg HS, lorazepam 1 mg 1-2 mg in the AM and HS and 1 tablet prn midday for anxiety, temazepam 30 mg HS Patient received Spravato 84 mg today.  She tolerated it well without any unusual headache, nausea or vomiting or other somatic symptoms.  Dissociation did occur and she gradually saw resolution over the 2-hour period of observation.  She does not typically find the dissociation very strong. No SE complaints with meds. Continues to feel depressed and overwhelmed by family problems including her brother's mania and recent eviction and commitment.  Chronic OCD worse when stressed.  No SI.  Tolerating meds. Plan: Per her request continue Wellbutrin XL 450 mg every morning. She has come to the realization that the rash she had previously attributed to [facility] was not related.  She is interested in perhaps retrying Auvelity. There are few alternative medication options that remain.  01/11/23 appt noted: Current psych meds: Wellbutrin XL 150 mg BID and started Auvelity 1 AM, fluvoxamine 100 mg in AM and 300 mg HS, lorazepam 1 mg 1-2 mg in the AM and HS and 1 tablet prn midday for anxiety, temazepam 30 mg HS Patient received Spravato 84 mg  today.  She tolerated it well without any unusual headache, nausea or vomiting or other somatic symptoms.  Dissociation did occur and she gradually saw resolution over the 2-hour period of observation.  She does not typically find the dissociation very strong. No SE complaints with meds. Trouble getting to sessions lately DT transportation problems.   Continues to feel depressed and overwhelmed by OCD and family.  Feels she needs toevery other week Spravato bc it helps for a couple of weeks and then seems to wear off.  Struggleing with OCD and depression both of which are eased by Spravato. Less  crying with Auvelity.  02/07/23 appt noted: Current psych meds: Wellbutrin XL 150 mg BID and started Auvelity 1 AM, fluvoxamine 100 mg in AM and 300 mg HS, lorazepam 1 mg 1-2 mg in the AM and HS and 1 tablet prn midday for anxiety, temazepam 30 mg HS Patient received Spravato 84 mg today.  She tolerated it well without any unusual headache, nausea or vomiting or other somatic symptoms.  Dissociation did occur and she gradually saw resolution over the 2-hour period of observation.  She does not typically find the dissociation very strong. No SE complaints with meds. Trouble getting to sessions lately DT transportation problems.  This is a problem ongoing and thinks she might need to pause Spravato bc won't be able to get her for at least 3 weeks. She is holding pretty steady with a moderate level of anxiety and depression ongoing and chronic.    03/20/23 appt noted: Current psych meds: Wellbutrin XL 150 mg BID and started Auvelity 1 AM, fluvoxamine 100 mg in AM and 300 mg HS, lorazepam 1 mg 1-2 mg in the AM and HS and 1 tablet prn midday for anxiety, temazepam 30 mg HS Patient received Spravato 84 mg today.  She tolerated it well without any unusual headache, nausea or vomiting or other somatic symptoms.  Dissociation did occur and she gradually saw resolution over the 2-hour period of observation.  She does not typically find the dissociation very strong. No SE complaints with meds. She is trying to get back into more regular Spravato administration.  Family issues and transportation problems that led to her missing Spravato.  She feels more depressed without regular Spravato.  Her OCD is chronic but also worse when she misses Spravato.  She does not want any medication changes.  04/03/23 appt noted: Current psych meds: Wellbutrin XL 150 mg BID and started Auvelity 1 AM, fluvoxamine 100 mg in AM and 300 mg HS, lorazepam 1 mg 1-2 mg in the AM and HS and 1 tablet prn midday for anxiety, temazepam 30 mg  HS Patient received Spravato 84 mg today.  She tolerated it well without any unusual headache, nausea or vomiting or other somatic symptoms.  Dissociation did occur and she gradually saw resolution over the 2-hour period of observation.  She does not typically find the dissociation very strong.  It gets rid of negative emotion for awhile after procedure and would like it to last longer.   No SE complaints with meds.  Doesn't really want med changes.   Planning to weekly Spravato.  It is helping dep and anxiety.       Previous psych med trials include Prozac, paroxetine, sertraline, fluvoxamine, venlafaxine, Anafranil with no response,  Wellbutrin, , Viibryd, Trintellix 10 1 month NR Auvelity BID  Geodon,  risperidone, Rexulti, Abilify,  Seroquel, Latuda 40 mg with irritability.  lamotrigine lithium,  BuSpar, Namenda,  pramipexole with  no response, and Topamax, pindolol  ECT-MADRS    Flowsheet Row Office Visit from 06/29/2021 in [facility]  MADRS Total Score 36      Flowsheet Row Admission (Discharged) from 06/11/2021 in [place] [facility]  C-SSRS RISK CATEGORY No Risk        Review of Systems:  Review of Systems  Constitutional:  Positive for fatigue.  Cardiovascular:  Negative for palpitations.  Musculoskeletal:  Positive for arthralgias, back pain, gait problem and neck pain.  Neurological:  Positive for weakness and headaches. Negative for tremors.  Psychiatric/Behavioral:  Positive for dysphoric mood. Negative for suicidal ideas. The patient is nervous/anxious.     Medications: I have reviewed the patient's current medications.  Current Outpatient Medications  Medication Sig Dispense Refill   Abaloparatide (TYMLOS) 3120 MCG/1.56ML SOPN Inject into the skin.     Azelastine-Fluticasone 137-50 MCG/ACT SUSP Place 1-2 sprays into both nostrils daily.     baclofen (LIORESAL) 10 MG tablet Take 20 mg by mouth at bedtime as needed for  muscle spasms.     buPROPion (WELLBUTRIN XL) 150 MG 24 hr tablet TAKE 3 TABLETS BY MOUTH DAILY (Patient taking differently: Take 150 mg by mouth daily.) 270 tablet 1   Dextromethorphan-buPROPion ER (AUVELITY) 45-105 MG TBCR Take 1 tablet by mouth daily.     dicyclomine (BENTYL) 10 MG capsule Take 10 mg by mouth daily.     docusate sodium (COLACE) 100 MG capsule Take 1 capsule (100 mg total) by mouth 2 (two) times daily. (Patient taking differently: Take 100 mg by mouth daily.) 10 capsule 0   Esketamine HCl, 84 MG Dose, (SPRAVATO, 84 MG DOSE,) 28 MG/DEVICE SOPK USE 3 SPRAYS IN EACH NOSTRIL EVERY WEEK 3 each 0   fexofenadine (ALLEGRA) 180 MG tablet Take 180 mg by mouth daily.     fluvoxaMINE (LUVOX) 100 MG tablet TAKE 1 TABLET IN THE AM AND 3 TABLETS AT NIGHT 120 tablet 0   hydrocortisone (ANUSOL-HC) 2.5 % rectal cream Place rectally 2 (two) times daily. x 7-14 days 30 g 0   ketotifen (ZADITOR) 0.025 % ophthalmic solution Place 3 drops into both eyes at bedtime.     LORazepam (ATIVAN) 1 MG tablet TAKE 1-2 IN THE AM AND 1-2 TABLETS EVERY NIGHT AT BEDTIME AND 1 TABLET IN AFTERNOON WHEN NEEDED FOR ANXIETY AND SLEEP 150 tablet 1   magnesium gluconate (MAGONATE) 500 MG tablet Take 500 mg by mouth daily.     MIBELAS 24 FE 1-20 MG-MCG(24) CHEW Chew 1 tablet by mouth at bedtime as needed (bowel regularity).     Multiple Vitamins-Minerals (ADULT GUMMY PO) Take 2 tablets by mouth in the morning.     nitrofurantoin (MACRODANTIN) 100 MG capsule Take 100 mg by mouth as needed (For urinary tract infection.).      oxyCODONE-acetaminophen (PERCOCET/ROXICET) 5-325 MG tablet Take 1-2 tablets by mouth every 6 (six) hours as needed for severe pain. 50 tablet 0   polyethylene glycol (MIRALAX / GLYCOLAX) packet Take 17 g by mouth daily as needed for mild constipation. 14 each 0   propranolol ER (INDERAL LA) 60 MG 24 hr capsule TAKE 1 CAPSULE BY MOUTH EVERY DAY 30 capsule 5   psyllium (METAMUCIL) 58.6 % powder Take 1  packet by mouth daily as needed (constipation).     SUMAtriptan (IMITREX) 100 MG tablet TAKE 1 TABLET (100 MG TOTAL) BY MOUTH EVERY 2 (TWO) HOURS AS NEEDED FOR MIGRAINE. MAY REPEAT IN 2 HOURS IF HEADACHE PERSISTS  OR RECURS. 10 tablet 1   temazepam (RESTORIL) 30 MG capsule Take 1 capsule by mouth at bedtime as needed for sleep 30 capsule 2   Vitamin D-Vitamin K (VITAMIN K2-VITAMIN D3 PO) Take 1-2 sprays by mouth daily.     No current facility-administered medications for this visit.    Medication Side Effects: None   Allergies:  Allergies  Allergen Reactions   Hydrocodone Itching   Sulfamethoxazole-Trimethoprim Itching   Dust Mite Extract Other (See Comments)    Sneezing, watery eyes, runny nose   Latex Itching   Other Other (See Comments)    PT IS ALLERGIC TO CAT DANDER AND RAGWEED - Sneezing, watery eyes, runny nose    Pollen Extract Other (See Comments)    Sneezing, watery eyes, runny nose     Past Medical History:  Diagnosis Date   Abnormal Pap smear 2011   hpv/mild dysplasia,cin1   Anxiety    Cerebral palsy (HCC)    right arm/leg   Cystocele    Depression    Headache    Neuromuscular disorder (HCC)    Cerebral Palsy   OCD (obsessive compulsive disorder)    Osteoporosis    Uterine prolaps     Family History  Problem Relation Age of Onset   Cancer Father        skin AND LUNG   Alcohol abuse Sister        CRACK COCAINE    Social History   Socioeconomic History   Marital status: Married    Spouse name: Not on file   Number of children: Not on file   Years of education: Not on file   Highest education level: Not on file  Occupational History   Not on file  Tobacco Use   Smoking status: Never   Smokeless tobacco: Never  Substance and Sexual Activity   Alcohol use: Not Currently    Comment: OCCASIONAL beer   Drug use: No   Sexual activity: Yes    Birth control/protection: Pill    Comment: LOESTRIN 24 FE  Other Topics Concern   Not on file  Social  History Narrative   Not on file   Social Determinants of Health   Financial Resource Strain: Not on file  Food Insecurity: Not on file  Transportation Needs: Not on file  Physical Activity: Not on file  Stress: Not on file  Social Connections: Not on file  Intimate Partner Violence: Not on file    Past Medical History, Surgical history, Social history, and Family history were reviewed and updated as appropriate.   Please see review of systems for further details on the patient's review from today.   Objective:   Physical Exam:  LMP  (LMP Unknown)   Physical Exam Constitutional:      General: She is not in acute distress. Neurological:     Mental Status: She is alert and oriented to person, place, and time.     Cranial Nerves: No dysarthria.     Motor: Weakness present.     Gait: Gait abnormal.  Psychiatric:        Attention and Perception: Attention and perception normal.        Mood and Affect: Mood is anxious and depressed. Affect is not labile or tearful.        Speech: Speech normal.        Behavior: Behavior normal. Behavior is cooperative.        Thought Content: Thought content normal. Thought content is not  delusional. Thought content does not include homicidal or suicidal ideation. Thought content does not include suicidal plan.        Cognition and Memory: Cognition and memory normal. Cognition is not impaired.        Judgment: Judgment normal.     Comments: Insight intact Ongoing OCD remains fairly severe but less anxious Checking compulsions up to 2 hours daily but improved noticeably Chronic depression persistent but better with Spravato        Lab Review:     Component Value Date/Time   NA 138 06/11/2021 0606   K 4.0 06/11/2021 0606   CL 107 06/11/2021 0606   CO2 26 06/11/2021 0606   GLUCOSE 90 06/11/2021 0606   BUN 18 06/11/2021 0606   CREATININE 0.81 06/11/2021 0606   CALCIUM 9.4 06/11/2021 0606   PROT 6.5 06/11/2021 0606   ALBUMIN 3.3 (L)  06/11/2021 0606   AST 17 06/11/2021 0606   ALT 14 06/11/2021 0606   ALKPHOS 141 (H) 06/11/2021 0606   BILITOT 0.2 (L) 06/11/2021 0606   GFRNONAA >60 06/11/2021 0606   GFRAA >60 07/09/2016 0438       Component Value Date/Time   WBC 5.8 06/11/2021 0606   RBC 4.12 06/11/2021 0606   HGB 12.5 06/11/2021 0606   HCT 39.7 06/11/2021 0606   PLT 299 06/11/2021 0606   MCV 96.4 06/11/2021 0606   MCH 30.3 06/11/2021 0606   MCHC 31.5 06/11/2021 0606   RDW 13.9 06/11/2021 0606   LYMPHSABS 1.9 06/11/2021 0606   MONOABS 0.5 06/11/2021 0606   EOSABS 0.1 06/11/2021 0606   BASOSABS 0.0 06/11/2021 0606    No results found for: "POCLITH", "LITHIUM"   No results found for: "PHENYTOIN", "PHENOBARB", "VALPROATE", "CBMZ"   .res Assessment: Plan:    Kmora was seen today for follow-up, depression and anxiety.  Diagnoses and all orders for this visit:  Recurrent major depression resistant to treatment [facility])  Mixed obsessional thoughts and acts  Social anxiety disorder  Migraine without aura and without status migrainosus, not intractable  Insomnia due to mental condition    Both Dx are TR and marked.  Impaired function but less so with Spravato re: depression..   She is receiving Spravato 84 mg weekly and marked improvement in the depression..  she feels it also helps OCD somewhat.  However still easily overwhelmed with low stress tolerance.  The OCD is improved with the increase in fluvoxamine and with Spravato.  Spends 2 hours daily and checking compulsions on her worst days but better when she travels.  She has been on higher doses of fluvoxamine above the usual max of 400 mg daily in the past.    Disc SE. She would like to continue Luvox 400 mg daily again    She is tolerating the meds well  Continue  Luvox back to 400 mg nightly as of January 2023. Disc dosing higher than usual.  She feels this is increase has helped more with OCD which remains chronically severe.  Contiinue  Auvelity 1 AM and  Increase back to Wellbutrin XL 150 BID.  Option increase Auvelity BID and reduce Wellbutrin to 1 AM. But she didn't think it made a lot more difference than 1 Auveltiy daily when taken in summer 2023. There are few other alternative medication options that remain.   Disc DDI issues.    Disc Spravato DT TRD incl details and SE. Disc dosing and duration.  Pt with severe depression MADRS 36 on 06/29/21  Patient was administered Spravato 84 mg intranasally dosage today.  The patient experienced the typical dissociation which gradually resolved over the 2-hour period of observation.  There were no complications.  Specifically the patient did not have nausea or vomiting or headache.  Blood pressures remained within normal ranges at the 40-minute and 2-hour follow-up intervals.  By the time the 2-hour observation period was met the patient was alert and oriented and able to exit without assistance. She tends to have lingering sedative effects but not severe. .  See nursing note for further details. She is ready to be more consistent.  Didn't feel as well when off spravato.  We discussed the short-term risks associated with benzodiazepines including sedation and increased fall risk among others.  Discussed long-term side effect risk including dependence, potential withdrawal symptoms, and the potential eventual dose-related risk of dementia.  But recent studies from 2020 dispute this association between benzodiazepines and dementia risk. Newer studies in 2020 do not support an association with dementia. Disc this is high dose and not ideal.  Also disc risk combining it with temazepam. Rec gradually reduce HS lorazepam to 1 mg Hs.  Can continue lorazepam 2 mg AM and 1 mg in afternoon bc of chronic anxiety and it is helpful and tolerated. She can continue temazepam 30 mg nightly.  She tends to have a lot of anxious negative thoughts at night when she is trying to go to bed.  She is trying to  reduce the dose.  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  Complaining of HA and history migraine.  Asks for increase imitrex and disc preventatives like propranolol ER imitrex to 100 mg prn migraine and propranolol ER for migraine prevention.  Supportive therapy dealing with some of the recent stressors including fbrother's mania and conflict.  Plan:  Continue Auvelity 1 in AM Increase Wellbutrin XL 150 mg BID Fluvoxamine 100 mg AM and 300 mg PM above usual max bc med necessary Lorazepam 2 mg HS and prn 1 mg BID prn anxiety daily Temazepam 30 mg HS Propranolol ER 60 mg daily Imitrex prn migraine  Follow-up every week if possible and try to be more consistent. That is her plan.     Meredith Staggers, MD, DFAPA  Please see After Visit Summary for patient specific instructions.  Future Appointments  Date Time Provider Department Center  05/09/2023  4:00 PM Robley Fries, PhD [facility] None  05/23/2023 10:00 AM Robley Fries, PhD [facility] None  06/06/2023 11:00 AM Robley Fries, PhD [facility] None  06/20/2023 11:00 AM Robley Fries, PhD [facility] None  07/04/2023 11:00 AM Robley Fries, PhD [facility] None  07/18/2023 11:00 AM Robley Fries, PhD [facility] None  08/01/2023 11:00 AM Robley Fries, PhD [facility] None    No orders of the defined types were placed in this encounter.    -------------------------------

## 2023-04-03 NOTE — Progress Notes (Signed)
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 Pt is being treated for Treatment Resistant Depression, Pt taken to treatment room. Pt's medication is charged through [employer].  Medication is stored behind 2 locked doors, it is never given to the pt until time of administration which is observed by the nurse. Disposed of per FDA/REMS regulations. All Spravato Treatments are documented in Spravato REMS per protocol of being a treatment center.    She was directed to the treatment room to get vitals taken first. Initial vital signs are B/P at 3:10 PM 111/71, 65, Pt instructed to blow her nose and to recline back at 45 degrees. Pt given first nasal spray (28 mg) administered by pt observed by nurse. Pt always has to go to the restroom after her first dose, at that point she is not too medicated to walk to bathroom safely.There were 5 minutes between each dose, total of 84 mg. Tolerated well. Pt's medication is delivered by [facility] in [place] and stored inside a safe behind a locked door as well. Spravato is a CIII medication and has to be only given at a treatment facility and observed by nurse as pt administered intranasally.  Assessed pt's 40 minute vital signs at 3:50 PM 96/66, 69.  Pt was escorted to Dr. Alwyn Ren office after discharge vitals, to discuss her care at the end of her treatment when her thoughts are clearer and they discussed her medication and moods. She does go to the bathroom at least once during her treatment and again towards the end when she is clearer and able to walk. No sedation and had slight feeling of being "high" she reports. Discharge vitals at 5:10 PM 104/76, 67. Pt stable for discharge.  Pt was observed on site a total of 120 minutes per FDA/REMS requirements. Pt was with nurse for clinical assessment 50 minutes. Pt  is scheduled for next Monday, June 17 th for her next treatment.     LOT 23MG 511 EXP FEB 2027

## 2023-04-05 ENCOUNTER — Other Ambulatory Visit: Payer: Self-pay | Admitting: Psychiatry

## 2023-04-05 DIAGNOSIS — F339 Major depressive disorder, recurrent, unspecified: Secondary | ICD-10-CM

## 2023-04-05 DIAGNOSIS — F422 Mixed obsessional thoughts and acts: Secondary | ICD-10-CM

## 2023-04-10 ENCOUNTER — Ambulatory Visit: Payer: 59

## 2023-04-10 ENCOUNTER — Other Ambulatory Visit: Payer: Self-pay | Admitting: Psychiatry

## 2023-04-10 ENCOUNTER — Ambulatory Visit (INDEPENDENT_AMBULATORY_CARE_PROVIDER_SITE_OTHER): Payer: 59 | Admitting: Psychiatry

## 2023-04-10 ENCOUNTER — Encounter: Payer: Self-pay | Admitting: Psychiatry

## 2023-04-10 VITALS — BP 102/72 | HR 67

## 2023-04-10 DIAGNOSIS — G43009 Migraine without aura, not intractable, without status migrainosus: Secondary | ICD-10-CM | POA: Diagnosis not present

## 2023-04-10 DIAGNOSIS — F339 Major depressive disorder, recurrent, unspecified: Secondary | ICD-10-CM

## 2023-04-10 DIAGNOSIS — F4010 Social phobia, unspecified: Secondary | ICD-10-CM | POA: Diagnosis not present

## 2023-04-10 DIAGNOSIS — F422 Mixed obsessional thoughts and acts: Secondary | ICD-10-CM

## 2023-04-10 DIAGNOSIS — F5105 Insomnia due to other mental disorder: Secondary | ICD-10-CM

## 2023-04-10 DIAGNOSIS — Z636 Dependent relative needing care at home: Secondary | ICD-10-CM

## 2023-04-10 NOTE — Telephone Encounter (Signed)
Please send to [place] :)

## 2023-04-10 NOTE — Progress Notes (Signed)
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 Pt is being treated for Treatment Resistant Depression, Pt taken to treatment room. Pt's medication is charged through [employer].  Medication is stored behind 2 locked doors, it is never given to the pt until time of administration which is observed by the nurse. Disposed of per FDA/REMS regulations. All Spravato Treatments are documented in Spravato REMS per protocol of being a treatment center.    She was directed to the treatment room to get vitals taken first. Initial vital signs are B/P at 3:15 PM 110/72, 70, Pt instructed to blow her nose and to recline back at 45 degrees. Pt given first nasal spray (28 mg) administered by pt observed by nurse. Pt always has to go to the restroom after her first dose, at that point she is not too medicated to walk to bathroom safely.There were 5 minutes between each dose, total of 84 mg. Tolerated well. Pt's medication is delivered by [facility] in [place] and stored inside a safe behind a locked door as well. Spravato is a CIII medication and has to be only given at a treatment facility and observed by nurse as pt administered intranasally.  Assessed pt's 40 minute vital signs at 4:00 PM 96/65, 75.  Pt was escorted to Dr. Alwyn Ren office after discharge vitals, to discuss her care at the end of her treatment when her thoughts are clearer and they discussed her medication and moods. She does go to the bathroom at least once during her treatment and again towards the end when she is clearer and able to walk. No sedation and had slight feeling of being "high" she reports. Discharge vitals at 5:10 PM 102/72, 67. Pt stable for discharge.  Pt was observed on site a total of 120 minutes per FDA/REMS requirements. Pt was with nurse for clinical assessment 50 minutes. Pt  is scheduled for next Tuesday, July 2nd for her next treatment.     LOT 23MG 511 EXP FEB 2027

## 2023-04-10 NOTE — Progress Notes (Signed)
THY BENDER [id number] 1966/10/04 57 y.o.    Subjective:   Patient ID:  Casey Diaz is a 57 y.o. (DOB January 18, 1966) female.  Chief Complaint:  Chief Complaint  Patient presents with   Follow-up   Depression   Anxiety     HPI Casey Diaz presents to the office today for follow-up of OCD and severe anxiety.     December 2019 visit the following was noted: No meds were changed. Lives in [name] and back for followup.  Sx are about the same.  Has to take meds with different sizes. Pt reports that mood is Anxious and Depressed and describes anxiety as Severe. Anxiety symptoms include: Excessive Worry, Obsessive Compulsive Symptoms:   Checking,,. Pt reports has interrupted sleep and nocturia. Pt reports that appetite is good. Pt reports that energy is no change and down slightly. Concentration is down slightly. Suicidal thoughts:  denied by patient. Loves the environment of [name] but misses some things there.  She's not able to work there.  H works there and likes it.  Struggled with not working, feels isolated and not up to task of meeting people.  Does attend a church and met a friend who's been helpful.  Leaving for [name] on 10/16/18.   04/09/2020 appointment the following is noted:  Staying another year in [name] bc Covid and other things. Last few months a lot of crying spells.  Is in menopause. Wonders about med changes though is nervous about it.  Crying spells associated with depressing thoughts more than stress or OCD.   Covid really hard on everyone and couldn't see family for 18 mos.  Family still very dysfunctional. No close friends in part due to OCD and depression. Son high Autism spectrum with ADHD and anxiety and she's with him all the time. Greater health problems with CP so more pains.   05/15/20 appt with the following noted: [name] for menopause and helps some. Still depressed.  Chronically. In [place] for 2 more weeks then to [name] for another  year. A lot of stressors lately triggering more checking and anxiety.   OCD is her CC now and seems.  Got worse DT stress.   Stressed with Asberger's son and her health.  H works a lot.  Her FOO still stress. Plan: Trintellix 10 mg 1 tablet in the morning with food and reduce fluvoxamine to 5 tablets nightly for 1 week  then reduce it to 4 tablets nightly.   07/02/20 appt with the following noted: Decided not to get Trintellix bc difficulty getting it. It is available.  There.  Wants to start it now.   Both depression and OCD are severe.  Not suicidal in intent or plan. Did not take samples with her to [name] but will be back in December. covid is worse there and travel is difficult.  Wants to reduce Wellbutrin DT dry mouth. Plan: She's afraid to reduce Luvox at this time DT fear of worsening OCD.  But will consider. Trintellix 10 mg 1 tablet in the morning with food and reduce fluvoxamine to 5 tablets nightly for 1 week  then reduce it to 4 tablets nightly. Also reduce Wellbutrin XL to 300 mg daily.    9-13 2022 appointment with the following noted: Back in [place] since July 14.  Broke arm a month ago and surgery.  It's all been rough adjustment.   B has cancer on his face and M fell taking him to the doctor.  Misses the water  and weather of [place].   Cry a lot more since menopause. Still depression and anxiety and OCD.  Asks about ketamine. On Wellbutrin 300, Luvox 300.  No Trintellix. Added Ativan 2 mg AM and HS and it helps.  More likely to get upset at night. Plan: Increase Luvox back to 400 mg daily.  She thinks she's worse on less. Continue Wellbutrin XL to 300 mg daily. 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  She tolerated it well without unusual nausea or vomiting headache or other somatic symptoms.  She did have the expected dissociation which gradually resolved over the course  of the 2-hour period of observation.  She was a little concerned about her balance given her cerebral palsy but has not noted unusual or unexpected problems.  She is motivated to can continue Spravato in hopes of reducing her depressive symptoms. 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  She also has treatment resistant OCD which is partially managed with medications but is still quite disabling.  She is tolerating the medications well.  She is sleeping adequately.  Her appetite is adequate.  She is not having suicidal thoughts.  She continues to wish for a better treatment for OCD that would give her some relief.  09/30/2021 appointment with the following noted: She received her first dose of Spravato 84 mg intranasally today.  She tolerated it well without unusual nausea, vomiting, or other somatic symptoms.  Dissociation as expected did occur and gradually resolved over the 2-hour period of observation.  She did have a mild headache today with the treatment and received ibuprofen 600 mg at her request.  We will follow this to see if it is a pattern Patient is still depressed.  She said she was late with her medicine today and today was a particularly depressing day.  However she notes that the Spravato has lifted her mood considerably even today.  She is hopeful that it will continue to be helpful.  No suicidal thoughts.  She has ongoing chronic anxiety and OCD at baseline.  10/04/21 appt noted: Patient received Spravato 84 mg for the second time today.  She tolerated it well without any unusual headache, nausea or vomiting or other somatic symptoms.  Dissociation did occur and she gradually [employer] resolution over the 2-hour period of observation. She did not have any unusual problems after she left the office last Spravato administration.  She did not have any specific problems with balance or walking.  She is at increased risk of that difficulty because of cerebral  palsy.  So far she has not noticed much mood effect from the medication beyond the first day of receiving it.  However she would like to continue Spravato in hopes of getting the antidepressant effect that is desired. Stress dealing with mother's behavior at party pt hosted.  Guilt over it.  10/07/2021 appointment noted: Patient received Spravato 84 mg for the second time today.  She tolerated it well without any unusual headache, nausea or vomiting or other somatic symptoms.  Dissociation did occur and she gradually Kelley resolution over the 2-hour period of observation. She still is not sure about the antidepressant effect of Spravato.  Events over the holidays and demands, make it difficult to assess.  She still notes that the OCD tends to worsen the depression and vice versa.  She tolerates the Spravato well and  wants to continue the trial.  10/15/2021 appointment with the following noted: Patient received Spravato 84 mg for the second time today.  She tolerated it well without any unusual headache, nausea or vomiting or other somatic symptoms.  Dissociation did occur and she gradually [place] resolution over the 2-hour period of observation. Patient says it was somewhat difficult to evaluate the effect of the Spravato.  It was scheduled to be twice weekly for 4 weeks consecutively but the holidays have interfered with that administration.  She asked what specifically should be she should be looking for in order to assess improvement.  That was discussed.  The OCD is unchanged and the depression so far is not significantly different.  She still tolerates meds.  There have been no recent med changes  10/19/2021 appt noted: Patient received Spravato 84 mg for the second today.  She tolerated it well without any unusual headache, nausea or vomiting or other somatic symptoms.  Dissociation did occur and she gradually saw resolution over the 2-hour period of observation.   10/21/2021 appointment noted: Patient  received Spravato 84 mg today.  She tolerated it well without any unusual headache, nausea or vomiting or other somatic symptoms.  Dissociation did occur and she gradually saw resolution over the 2-hour period of observation.  She feels better than last week.  She is not as depressed and down.  She is still dealing with grief around the death of her cousin that was unexpected.  It is still difficult to tell how much the Spravato was doing but she is hopeful.  Anxiety is still present with the OCD.  She is not having suicidal thoughts.  She is not hopeless.  She wants to continue treatment.  10/25/2021 appointment with the following noted: Patient received Spravato 84 mg today.  She tolerated it well without any unusual headache, nausea or vomiting or other somatic symptoms.  Dissociation did occur and she gradually saw resolution over the 2-hour period of observation.  She does not typically find the dissociation very strong. She is beginning to think the Spravato is helping somewhat with the depression.  It has been difficult to tell with the holidays intervening as well as the death of her cousin.  She has not been able to get Spravato twice weekly for 4 weeks straight as typically planned.  However she is hopeful.  The OCD remains significant.  She still has a tendency to think very negatively.  She is not suicidal.  10/28/2021 appointment with the following noted: Patient received Spravato 84 mg today.  She tolerated it well without any unusual headache, nausea or vomiting or other somatic symptoms.  Dissociation did occur and she gradually saw resolution over the 2-hour period of observation.  She does not typically find the dissociation very strong. She is feeling more hopeful about the administration of Spravato.  She is having less depression she believes.  Still not dramatically different.  She still has a tendency to have a lot of anxiety and rumination and OCD.  She is not suicidal.  She is eager to  continue the Spravato.  11/01/2021 appointment with the following noted: Patient received Spravato 84 mg today.  She tolerated it well without any unusual headache, nausea or vomiting or other somatic symptoms.  Dissociation did occur and she gradually saw resolution over the 2-hour period of observation.  She does not typically find the dissociation very strong. She is continuing to see a little bit of improvement in depression with Spravato.  The anxiety remains but may be not as severe.  The OCD remains markedly severe chronically.  She is not suicidal.  She is encouraged by the degree of improvement with Spravato and inability to enjoy things more and not be quite as ruminative.  11/04/2021 appt noted: Patient received Spravato 84 mg today.  She tolerated it well without any unusual headache, nausea or vomiting or other somatic symptoms.  Dissociation did occur and she gradually saw resolution over the 2-hour period of observation.  She does not typically find the dissociation very strong. No SE complaints with meds. She continues to feel hopeful about the Spravato.  She has less depression.  Because of a number of factors she is uncertain of the full benefit but thinks she is somewhat less depressed.  Her anxiety and OCD remain significant but a little better.  She is tolerating the medications and does not desire medicine change.  She is not currently complaining of insomnia.   11/08/2021 appointment the following noted: Patient received Spravato 84 mg today.  She tolerated it well without any unusual headache, nausea or vomiting or other somatic symptoms.  Dissociation did occur and she gradually saw resolution over the 2-hour period of observation.  She does not typically find the dissociation very strong. No SE complaints with meds. She feels the Spravato is helping somewhat.  She would like to see a greater effect.  However she is able to enjoy things.  She is productive at home.  She would like  to see a lifting of a degree of sadness that remains.  The anxiety and OCD remained largely unchanged.  She wondered about the dosing of Wellbutrin 300 mg a day and Luvox 300 mg a day and possible increases.  She has been at higher doses in the past.  She plans to start water therapy for her weakness and for her shoulder.  11/11/2021 appointment with the following noted: Patient received Spravato 84 mg today.  She tolerated it well without any unusual headache, nausea or vomiting or other somatic symptoms.  Dissociation did occur and she gradually saw resolution over the 2-hour period of observation.  She does not typically find the dissociation very strong. No SE complaints with meds. She feels the Spravato is clearly helping the depression.  She would like to see a more significant effect.  She is still having trouble thinking positive. Her energy is fair.  Concentration is good except for the problem with chronic obsessions. She has been taking Wellbutrin 300 mg in Luvox 300 mg for quite some time but has taken higher doses in the past.  We discussed that.  She would like to try higher doses in order to get a better effect if possible. We just increased the doses a couple of days ago.  No effect yet.  11/15/2021 appointment with the following noted: Patient received Spravato 84 mg today.  She tolerated it well without any unusual headache, nausea or vomiting or other somatic symptoms.  Dissociation did occur and she gradually saw resolution over the 2-hour period of observation.  She does not typically find the dissociation very strong. No SE complaints with meds. The patient is now convinced that the Spravato is helping the depression.  She would like to continue twice weekly Spravato this week if possible.  She has tolerated the increase in Wellbutrin to 450 mg daily and the increase and fluvoxamine to 400 mg daily without complications thus far.  The OCD and anxiety feed the depression to  some  extent. She spends approximately 2 hours daily with checking compulsions due to obsessions about causing harm to others.  For example fearing that when she has hit a pot hole that she may have hit a person and going back to check.  Checking corners and rooms out of fear that she may have harmed someone.  Other various checking compulsions.  She is hoping the increase in fluvoxamine to 400 mg will reduce that over the weeks to come.  She is not seeing a significant difference with the addition of the Spravato though she understands that was not expected.  She is more productive at home and more motivated and able to enjoy things more fully as a result of the Spravato treatment.  She is tolerating the medication  11/18/2021 appointment with the following noted: Patient received Spravato 84 mg today.  She tolerated it well without any unusual headache, nausea or vomiting or other somatic symptoms.  Dissociation did occur and she gradually saw resolution over the 2-hour period of observation.  She does not typically find the dissociation very strong. No SE complaints with meds. She clearly believes the Spravato has been helpful for the depression.  She wonders whether to continue to treatments weekly or to cut back to 1 weekly.  She would like to continue twice weekly in hopes of getting additional improvement in the depression because it is not resolved but it is difficult to get here twice a week in terms of arranging rides. She is recently increased Wellbutrin XL to 450 mg daily and fluvoxamine to 400 mg daily but they have not had time to have an official effect.  She is tolerating that well.  She is tolerating meds overwork overall well. The OCD remains the same as noted on 11/15/2021  11/25/21 appt noted: Patient received Spravato 84 mg today.  She tolerated it well without any unusual headache, nausea or vomiting or other somatic symptoms.  Dissociation did occur and she gradually saw resolution over the  2-hour period of observation.  She does not typically find the dissociation very strong. No SE complaints with meds. She thinks the increase in Wellbutrin and Luvox have been potentially helpful for depression and OCD respectively.  It has been too early to see the full effect.  She is sleeping and eating well.  She is functioning at home.  She still spends a lot of time that is about 2 hours a day dealing with compulsive behaviors.  12/02/21 appt noted: Patient received Spravato 84 mg today.  She tolerated it well without any unusual headache, nausea or vomiting or other somatic symptoms.  Dissociation did occur and she gradually saw resolution over the 2-hour period of observation.  She does not typically find the dissociation very strong. No SE complaints with meds. Several losses and stressors recently that affect her sense of mood. However still sees significant benefit from the Spravato for her depression.  Wants to continue it. Suspect early  some benefit from the increased Wellbutrin for depression and Luvox for OCD. Tolerating meds. No complaints about the meds. Sleeping and eating well.  No new health concerns.  12/09/21 appt noted: Patient received Spravato 84 mg today.  She tolerated it well without any unusual headache, nausea or vomiting or other somatic symptoms.  Dissociation did occur and she gradually saw resolution over the 2-hour period of observation.  She does not typically find the dissociation very strong. No SE complaints with meds. Seeing noticeable improvement from increase fluvoxamine to  400 mg daily.  Tolerating meds without concerns over them. Depression is stable with residual sx of easy guilt and easily stressed.  OCD contributes to depression but depression is not severe with less crying spells.  Productive at home with chores.  Enjoyed recent birthday.  Sleeping good. No new concerns.  12/23/2021 appointment noted: Patient received Spravato 84 mg today.  She  tolerated it well without any unusual headache, nausea or vomiting or other somatic symptoms.  Dissociation did occur and she gradually saw resolution over the 2-hour period of observation.  She does not typically find the dissociation very strong. No SE complaints with meds. Seeing noticeable improvement from increase fluvoxamine to 400 mg daily.  Tolerating meds without concerns over them. Her depression is somewhat improved with the Spravato.  She also feels generally a little lighter.  She is more motivated.  She is less overwhelmed by guilt.  The OCD is gradually improving but is still quite time-consuming as noted before.  She is sleeping well.  No side effects  12/30/2021 appointment with the following noted: Patient received Spravato 84 mg today.  She tolerated it well without any unusual headache, nausea or vomiting or other somatic symptoms.  Dissociation did occur and she gradually saw resolution over the 2-hour period of observation.  She does not typically find the dissociation very strong. No SE complaints with meds. Seeing noticeable improvement from increase fluvoxamine to 400 mg daily.  Tolerating meds without concerns over them. She is confident of her the improvement seen with Spravato.  She is less hopeless.  Guilt is marked remarkably improved.  She is not having any thoughts of death or dying.  She is more motivated for activities such as exercise which she is recently started.  She is sleeping well. The OCD remains severe but it is improving somewhat with the increase in fluvoxamine.  It is still consuming a couple hours per day.  01/10/22 apravato 84 admin  01/24/22 appt noted: Patient received Spravato 84 mg today.  She tolerated it well without any unusual headache, nausea or vomiting or other somatic symptoms.  Dissociation did occur and she gradually saw resolution over the 2-hour period of observation.  She does not typically find the dissociation very strong. No SE  complaints with meds. Very tearful today.  Feels like she has been suppressing emotion in the Spravato caused it to be released.  Discussed some stressors.  Overall still feels the medicine is helpful.  She has missed some of the scheduled Spravato treatments that were intended to be weekly due to circumstances beyond her control.  She is still struggling with OCD as previously noted but does believe the medications are helpful. Plan no med changes  01/31/2022 received Spravato 84 mg today  02/09/2022 appointment with the following noted: Patient received Spravato 84 mg today.  She tolerated it well without any unusual headache, nausea or vomiting or other somatic symptoms.  Dissociation did occur and she gradually saw resolution over the 2-hour period of observation.  She does not typically find the dissociation very strong. No SE complaints with meds. Spravato clearly helps depression and OCD but easily gets overwhelmed and tearful with fairly routine stressors.  Tolerating meds. Sleep and appetite is OK Asks to increase lorazepam to 2 mg AM and HS and 46m afternoon  02/16/22 appt noted: Patient received Spravato 84 mg today.  She tolerated it well without any unusual headache, nausea or vomiting or other somatic symptoms.  Dissociation did occur and she  gradually saw resolution over the 2-hour period of observation.  She does not typically find the dissociation very strong. No SE complaints with meds. She has chronic depesssion and OCD but is improved with Spravato, both dx versus before.  She has continued Luvox 400 mg and Wellbutrin 450 mg and is tolerating it.  Chronically easily stressed.  Tolerating all meds.  Doesn't like taking more meds.  Spending a couple hours daily with OCD.  No SI No med changes.  02/21/22 appt noted:   Doesn't like taking more meds.  Spending a couple hours daily with OCD.  No SI No med changes.  02/21/22 appt noted: Patient received Spravato 84 mg today.  She  tolerated it well without any unusual headache, nausea or vomiting or other somatic symptoms.  Dissociation did occur and she gradually saw resolution over the 2-hour period of observation.  She does not typically find the dissociation very strong. No SE complaints with meds. She has chronic depesssion and OCD but is improved with Spravato, both dx versus before.  She has continued Luvox 400 mg and Wellbutrin 450 mg and is tolerating it.  Chronically easily overwhelmed and doesn't know why.  Tolerating all meds. Wants to continue meds.  03/16/22 appt noted: Patient received Spravato 84 mg today.  She tolerated it well without any unusual headache, nausea or vomiting or other somatic symptoms.  Dissociation did occur and she gradually saw resolution over the 2-hour period of observation.  She does not typically find the dissociation very strong. No SE complaints with meds. Overall she still feels the Spravato has been helpful not only for her depression but also for her OCD which was somewhat unexpected.  OCD is still significant but it is less severe than prior to starting Spravato.  She is tolerating Luvox 400 mg and Wellbutrin 450 mg.  We discussed possible med adjustments.  03/23/22 appt noted: Patient received Spravato 84 mg today.  She tolerated it well without any unusual headache, nausea or vomiting or other somatic symptoms.  Dissociation did occur and she gradually saw resolution over the 2-hour period of observation.  She does not typically find the dissociation very strong. No SE complaints with meds. She is still depressed and still has OCD of course but is improved with the Spravato.  She is tolerating the medications well.  We had previously discussed the possibility of switching some of the Wellbutrin to [facility] and she is very interested in that in hopes of further improvement in depression and OCD.  She understands that Auvelity is not used for OCD on the label.  She is tolerating the  medications.  She is still easily overwhelmed.  She is sleeping and eating okay.. Plan: Reduce Wellbutrin XL to 300 mg AM and add Auvelity 1 tablet each AM  03/30/22 appt noted: Patient received Spravato 84 mg today.  She tolerated it well without any unusual headache, nausea or vomiting or other somatic symptoms.  Dissociation did occur and she gradually saw resolution over the 2-hour period of observation.  She does not typically find the dissociation very strong. No SE complaints with meds. She is still depressed and still has OCD of course but is improved with the Spravato.  She is tolerating the medications well.  No difference with Auvelity 1 AM so far and no SE.  Going on vacation on Saturday. 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 or headache.  Blood pressures remained within normal ranges at the 40-minute and 2-hour follow-up intervals.  By the time the 2-hour observation  period was met the patient was alert and oriented and able to exit without assistance. She tends to have lingering sedative effects but not severe. .  See nursing note for further details. She is ready to be more consistent.  Didn't feel as well when off spravato.  We discussed the short-term risks associated with benzodiazepines including sedation and increased fall risk among others.  Discussed long-term side effect risk including dependence, potential withdrawal symptoms, and the potential eventual dose-related risk of dementia.  But recent studies from 2020 dispute this association between benzodiazepines and dementia risk. Newer studies in 2020 do not support an association with dementia. Disc this is high dose and not ideal.  Also disc risk combining it with temazepam. Rec try when possible gradually reduce HS lorazepam to 1 mg Hs.  Can continue lorazepam 2 mg AM and 1 mg in afternoon bc of chronic anxiety and it is helpful and tolerated. She can continue temazepam 30 mg nightly.  She tends to have a lot of anxious negative thoughts at night when she is trying to go to bed.  She is trying to reduce the dose.  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 She doesn't want to try this now.  Complaining of HA and history migraine.  Asks for increase imitrex and disc preventatives like propranolol ER imitrex to 100 mg prn migraine and propranolol ER for migraine prevention.  Supportive therapy dealing with some of the recent stressors including son's autism and recent issues of rebelliousness with him.    Plan no change indicated:   she agrees. Continue Auvelity 1 in AM Continue Wellbutrin XL 150 mg BID Fluvoxamine 100 mg AM and 300 mg PM above usual max bc med necessary Lorazepam 2 mg HS and prn 1 mg BID prn anxiety daily Temazepam 30 mg HS Propranolol ER 60 mg daily Imitrex prn migraine  Follow-up every week if possible and try  to be more consistent. That is her  plan.     Meredith Staggers, MD, DFAPA  Please see After Visit Summary for patient specific instructions.  Future Appointments  Date Time Provider Department Center  04/18/2023  3:00 PM CP-NURSE [facility] None  05/09/2023  4:00 PM Robley Fries, PhD CP-CP None  05/23/2023 10:00 AM Robley Fries, PhD CP-CP None  06/06/2023 11:00 AM Robley Fries, PhD CP-CP None  06/20/2023 11:00 AM Robley Fries, PhD CP-CP None  07/04/2023 11:00 AM Robley Fries, PhD CP-CP None  07/18/2023 11:00 AM Robley Fries, PhD CP-CP None  08/01/2023 11:00 AM Robley Fries, PhD CP-CP None    No orders of the defined types were placed in this encounter.    -------------------------------

## 2023-04-18 ENCOUNTER — Ambulatory Visit (INDEPENDENT_AMBULATORY_CARE_PROVIDER_SITE_OTHER): Payer: 59 | Admitting: Adult Health

## 2023-04-18 ENCOUNTER — Ambulatory Visit: Payer: 59

## 2023-04-18 ENCOUNTER — Encounter: Payer: Self-pay | Admitting: Adult Health

## 2023-04-18 VITALS — BP 110/76 | HR 64

## 2023-04-18 DIAGNOSIS — F339 Major depressive disorder, recurrent, unspecified: Secondary | ICD-10-CM

## 2023-04-18 NOTE — Progress Notes (Signed)
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 Pt is being treated for Treatment Resistant Depression, Pt taken to treatment room. Pt's medication is charged through [employer].  Medication is stored behind 2 locked doors, it is never given to the pt until time of administration which is observed by the nurse. Disposed of per FDA/REMS regulations. All Spravato Treatments are documented in Spravato REMS per protocol of being a treatment center.    She was directed to the treatment room to get vitals taken first. Initial vital signs are B/P at 3:15 PM 117/80, 68, Pt instructed to blow her nose and to recline back at 45 degrees. Pt given first nasal spray (28 mg) administered by pt observed by nurse. Pt always has to go to the restroom after her first dose, at that point she is not too medicated to walk to bathroom safely.There were 5 minutes between each dose, total of 84 mg. Tolerated well. Pt's medication is delivered by [facility] in [place] and stored inside a safe behind a locked door as well. Spravato is a CIII medication and has to be only given at a treatment facility and observed by nurse as pt administered intranasally.  Assessed pt's 40 minute vital signs at 4:00 PM 112/81, 64.  Pt was seen by Yvette Rack, NP while [name] out of town, they discussed her care at the end of her treatment when her thoughts are clearer and they discussed her medication and moods. She does go to the bathroom at least once during her treatment and again towards the end when she is clearer and able to walk. No sedation and had slight feeling of being "high" she reports. Discharge vitals at 5:05 PM 102/72, 67. Pt stable for discharge.  Pt was observed on site a total of 120 minutes per FDA/REMS requirements. Pt was with nurse for clinical assessment 50  minutes. Pt is scheduled Tuesday, July 16th for her next treatment, after they return from [place].     LOT [id number] APR 2027

## 2023-04-18 NOTE — Progress Notes (Signed)
Casey Diaz [id number] November 05, 1965 57 y.o.  Subjective:   Patient ID:  Casey Diaz is a 57 y.o. (DOB June 23, 1966) female.  Chief Complaint: No chief complaint on file.   HPI Casey Diaz presents to the office today for follow-up of follow-up of treatment resistant depression (TRD).   Spravato treatment    Patient was administered Spravato 84 mg intranasally today. Patient was observed by provider throughout [facility] treatment. The patient experienced the typical dissociation which gradually resolved over the 2-hour period of observation. There were no complications. Specifically, the patient did not have any untoward side effects - feeling disconnected from themself, their thoughts, feelings and things around them, dizziness, nausea, feeling sleepy, decreased feeling of sensitivity (numbness) spinning sensation, feeling anxious, lack of energy, increased blood pressure, feeling happy or very excited, or headache. Blood pressures remained within normal ranges at the 40-minute and 2-hour follow-up intervals. By the time the 2-hour observation period was met the patient was alert and oriented and able to exit without assistance. Patient willing to continue Spravato administration for the treatment of resistant depression. See nursing note for further details.   ECT-MADRS    Flowsheet Row Office Visit from 06/29/2021 in [facility]  MADRS Total Score 36      Flowsheet Row Admission (Discharged) from 06/11/2021 in [facility]  C-SSRS RISK CATEGORY No Risk        Review of Systems:  Review of Systems  Musculoskeletal:  Negative for gait problem.  Neurological:  Negative for tremors.  Psychiatric/Behavioral:         Please refer to HPI    Medications: I have reviewed the patient's current medications.  Current Outpatient Medications  Medication Sig Dispense Refill   Abaloparatide (TYMLOS) 3120 MCG/1.56ML SOPN Inject into the skin.      Azelastine-Fluticasone 137-50 MCG/ACT SUSP Place 1-2 sprays into both nostrils daily.     baclofen (LIORESAL) 10 MG tablet Take 20 mg by mouth at bedtime as needed for muscle spasms.     buPROPion (WELLBUTRIN XL) 150 MG 24 hr tablet TAKE 3 TABLETS BY MOUTH DAILY (Patient taking differently: Take 150 mg by mouth daily.) 270 tablet 1   Dextromethorphan-buPROPion ER (AUVELITY) 45-105 MG TBCR Take 1 tablet by mouth daily.     dicyclomine (BENTYL) 10 MG capsule Take 10 mg by mouth daily.     docusate sodium (COLACE) 100 MG capsule Take 1 capsule (100 mg total) by mouth 2 (two) times daily. (Patient taking differently: Take 100 mg by mouth daily.) 10 capsule 0   Esketamine HCl, 84 MG Dose, (SPRAVATO, 84 MG DOSE,) 28 MG/DEVICE SOPK USE 3 SPRAYS IN EACH NOTRIL EVERY WEEK 3 each 2   fexofenadine (ALLEGRA) 180 MG tablet Take 180 mg by mouth daily.     fluvoxaMINE (LUVOX) 100 MG tablet TAKE 1 TABLET IN THE AM AND 3 TABLETS AT NIGHT 360 tablet 0   hydrocortisone (ANUSOL-HC) 2.5 % rectal cream Place rectally 2 (two) times daily. x 7-14 days 30 g 0   ketotifen (ZADITOR) 0.025 % ophthalmic solution Place 3 drops into both eyes at bedtime.     LORazepam (ATIVAN) 1 MG tablet TAKE 1-2 IN THE AM AND 1-2 TABLETS EVERY NIGHT AT BEDTIME AND 1 TABLET IN AFTERNOON WHEN NEEDED FOR ANXIETY AND SLEEP 150 tablet 1   magnesium gluconate (MAGONATE) 500 MG tablet Take 500 mg by mouth daily.     MIBELAS 24 FE 1-20 MG-MCG(24) CHEW Chew 1 tablet  by mouth at bedtime as needed (bowel regularity).     Multiple Vitamins-Minerals (ADULT GUMMY PO) Take 2 tablets by mouth in the morning.     nitrofurantoin (MACRODANTIN) 100 MG capsule Take 100 mg by mouth as needed (For urinary tract infection.).      oxyCODONE-acetaminophen (PERCOCET/ROXICET) 5-325 MG tablet Take 1-2 tablets by mouth every 6 (six) hours as needed for severe pain. 50 tablet 0   polyethylene glycol (MIRALAX / GLYCOLAX) packet Take 17 g by mouth daily as needed for mild  constipation. 14 each 0   propranolol ER (INDERAL LA) 60 MG 24 hr capsule TAKE 1 CAPSULE BY MOUTH EVERY DAY 30 capsule 5   psyllium (METAMUCIL) 58.6 % powder Take 1 packet by mouth daily as needed (constipation).     SUMAtriptan (IMITREX) 100 MG tablet TAKE 1 TABLET (100 MG TOTAL) BY MOUTH EVERY 2 (TWO) HOURS AS NEEDED FOR MIGRAINE. MAY REPEAT IN 2 HOURS IF HEADACHE PERSISTS OR RECURS. 10 tablet 1   temazepam (RESTORIL) 30 MG capsule Take 1 capsule by mouth at bedtime as needed for sleep 30 capsule 2   Vitamin D-Vitamin K (VITAMIN K2-VITAMIN D3 PO) Take 1-2 sprays by mouth daily.     No current facility-administered medications for this visit.    Medication Side Effects: None  Allergies:  Allergies  Allergen Reactions   Hydrocodone Itching   Sulfamethoxazole-Trimethoprim Itching   Dust Mite Extract Other (See Comments)    Sneezing, watery eyes, runny nose   Latex Itching   Other Other (See Comments)    PT IS ALLERGIC TO CAT DANDER AND RAGWEED - Sneezing, watery eyes, runny nose    Pollen Extract Other (See Comments)    Sneezing, watery eyes, runny nose     Past Medical History:  Diagnosis Date   Abnormal Pap smear 2011   hpv/mild dysplasia,cin1   Anxiety    Cerebral palsy (HCC)    right arm/leg   Cystocele    Depression    Headache    Neuromuscular disorder (HCC)    Cerebral Palsy   OCD (obsessive compulsive disorder)    Osteoporosis    Uterine prolaps     Past Medical History, Surgical history, Social history, and Family history were reviewed and updated as appropriate.   Please see review of systems for further details on the patient's review from today.   Objective:   Physical Exam:  LMP  (LMP Unknown)   Physical Exam Constitutional:      General: She is not in acute distress. Musculoskeletal:        General: No deformity.  Neurological:     Mental Status: She is alert and oriented to person, place, and time.     Coordination: Coordination normal.   Psychiatric:        Attention and Perception: Attention and perception normal. She does not perceive auditory or visual hallucinations.        Mood and Affect: Affect is not labile, blunt, angry or inappropriate.        Speech: Speech normal.        Behavior: Behavior normal.        Thought Content: Thought content normal. Thought content is not paranoid or delusional. Thought content does not include homicidal or suicidal ideation. Thought content does not include homicidal or suicidal plan.        Cognition and Memory: Cognition and memory normal.        Judgment: Judgment normal.     Comments: Insight  intact     Lab Review:     Component Value Date/Time   NA 138 06/11/2021 0606   K 4.0 06/11/2021 0606   CL 107 06/11/2021 0606   CO2 26 06/11/2021 0606   GLUCOSE 90 06/11/2021 0606   BUN 18 06/11/2021 0606   CREATININE 0.81 06/11/2021 0606   CALCIUM 9.4 06/11/2021 0606   PROT 6.5 06/11/2021 0606   ALBUMIN 3.3 (L) 06/11/2021 0606   AST 17 06/11/2021 0606   ALT 14 06/11/2021 0606   ALKPHOS 141 (H) 06/11/2021 0606   BILITOT 0.2 (L) 06/11/2021 0606   GFRNONAA >60 06/11/2021 0606   GFRAA >60 07/09/2016 0438       Component Value Date/Time   WBC 5.8 06/11/2021 0606   RBC 4.12 06/11/2021 0606   HGB 12.5 06/11/2021 0606   HCT 39.7 06/11/2021 0606   PLT 299 06/11/2021 0606   MCV 96.4 06/11/2021 0606   MCH 30.3 06/11/2021 0606   MCHC 31.5 06/11/2021 0606   RDW 13.9 06/11/2021 0606   LYMPHSABS 1.9 06/11/2021 0606   MONOABS 0.5 06/11/2021 0606   EOSABS 0.1 06/11/2021 0606   BASOSABS 0.0 06/11/2021 0606    No results found for: "POCLITH", "LITHIUM"   No results found for: "PHENYTOIN", "PHENOBARB", "VALPROATE", "CBMZ"   .res Assessment: Plan:    Recommendations/Plan   Continue Spravato treatment.   Patient advised to contact office with any questions, adverse effects, or acute worsening in signs and symptoms.  Diagnoses and all orders for this visit:  Recurrent  major depression resistant to treatment [facility])     Please see After Visit Summary for patient specific instructions.  Future Appointments  Date Time Provider Department Center  04/18/2023  3:00 PM CP-NURSE CP-CP None  04/18/2023  3:00 PM Anabeth Chilcott, Thereasa Solo, NP CP-CP None  05/09/2023  4:00 PM Robley Fries, PhD CP-CP None  05/23/2023 10:00 AM Robley Fries, PhD CP-CP None  06/06/2023 11:00 AM Robley Fries, PhD CP-CP None  06/20/2023 11:00 AM Robley Fries, PhD CP-CP None  07/04/2023 11:00 AM Robley Fries, PhD CP-CP None  07/18/2023 11:00 AM Robley Fries, PhD CP-CP None  08/01/2023 11:00 AM Robley Fries, PhD CP-CP None    No orders of the defined types were placed in this encounter.   -------------------------------

## 2023-04-19 ENCOUNTER — Other Ambulatory Visit: Payer: Self-pay | Admitting: Psychiatry

## 2023-04-21 ENCOUNTER — Other Ambulatory Visit: Payer: Self-pay | Admitting: Psychiatry

## 2023-04-21 NOTE — Telephone Encounter (Signed)
Send refills 

## 2023-04-30 ENCOUNTER — Other Ambulatory Visit: Payer: Self-pay | Admitting: Psychiatry

## 2023-04-30 DIAGNOSIS — F4010 Social phobia, unspecified: Secondary | ICD-10-CM

## 2023-04-30 DIAGNOSIS — F5105 Insomnia due to other mental disorder: Secondary | ICD-10-CM

## 2023-05-01 NOTE — Telephone Encounter (Signed)
Pt LVM requesting Lorazepam & Temazepam also.

## 2023-05-02 ENCOUNTER — Encounter: Payer: 59 | Admitting: Psychiatry

## 2023-05-03 ENCOUNTER — Ambulatory Visit (INDEPENDENT_AMBULATORY_CARE_PROVIDER_SITE_OTHER): Payer: 59 | Admitting: Psychiatry

## 2023-05-03 ENCOUNTER — Ambulatory Visit: Payer: 59

## 2023-05-03 ENCOUNTER — Other Ambulatory Visit: Payer: Self-pay | Admitting: Psychiatry

## 2023-05-03 VITALS — BP 112/76 | HR 67

## 2023-05-03 DIAGNOSIS — G43009 Migraine without aura, not intractable, without status migrainosus: Secondary | ICD-10-CM | POA: Diagnosis not present

## 2023-05-03 DIAGNOSIS — F4010 Social phobia, unspecified: Secondary | ICD-10-CM

## 2023-05-03 DIAGNOSIS — F422 Mixed obsessional thoughts and acts: Secondary | ICD-10-CM | POA: Diagnosis not present

## 2023-05-03 DIAGNOSIS — F339 Major depressive disorder, recurrent, unspecified: Secondary | ICD-10-CM

## 2023-05-03 DIAGNOSIS — F5105 Insomnia due to other mental disorder: Secondary | ICD-10-CM

## 2023-05-03 MED ORDER — AUVELITY 45-105 MG PO TBCR
1.0000 | EXTENDED_RELEASE_TABLET | Freq: Every day | ORAL | 1 refills | Status: DC
Start: 2023-05-03 — End: 2023-06-27

## 2023-05-03 MED ORDER — BUPROPION HCL ER (XL) 150 MG PO TB24: 150.0000 mg | ORAL_TABLET | Freq: Two times a day (BID) | ORAL | Status: DC

## 2023-05-03 NOTE — Progress Notes (Signed)
Casey Diaz [id number] 11-Jan-1966 57 y.o.    Subjective:   Patient ID:  Casey Diaz is a 57 y.o. (DOB 07/16/1966) female.  Chief Complaint:  No chief complaint on file.    HPI Casey Diaz presents to the office today for follow-up of OCD and severe anxiety.     December 2019 visit the following was noted: No meds were changed. Lives in [name] and back for followup.  Sx are about the same.  Has to take meds with different sizes. Pt reports that mood is Anxious and Depressed and describes anxiety as Severe. Anxiety symptoms include: Excessive Worry, Obsessive Compulsive Symptoms:   Checking,,. Pt reports has interrupted sleep and nocturia. Pt reports that appetite is good. Pt reports that energy is no change and down slightly. Concentration is down slightly. Suicidal thoughts:  denied by patient. Loves the environment of [name] but misses some things there.  She's not able to work there.  H works there and likes it.  Struggled with not working, feels isolated and not up to task of meeting people.  Does attend a church and met a friend who's been helpful.  Leaving for [name] on 10/16/18.   04/09/2020 appointment the following is noted:  Staying another year in [name] bc Covid and other things. Last few months a lot of crying spells.  Is in menopause. Wonders about med changes though is nervous about it.  Crying spells associated with depressing thoughts more than stress or OCD.   Covid really hard on everyone and couldn't see family for 18 mos.  Family still very dysfunctional. No close friends in part due to OCD and depression. Son high Autism spectrum with ADHD and anxiety and she's with him all the time. Greater health problems with CP so more pains.   05/15/20 appt with the following noted: [name] for menopause and helps some. Still depressed.  Chronically. In [place] for 2 more weeks then to [name] for another year. A lot of stressors lately triggering more checking  and anxiety.   OCD is her CC now and seems.  Got worse DT stress.   Stressed with Asberger's son and her health.  H works a lot.  Her FOO still stress. Plan: Trintellix 10 mg 1 tablet in the morning with food and reduce fluvoxamine to 5 tablets nightly for 1 week  then reduce it to 4 tablets nightly.   07/02/20 appt with the following noted: Decided not to get Trintellix bc difficulty getting it. It is available.  There.  Wants to start it now.   Both depression and OCD are severe.  Not suicidal in intent or plan. Did not take samples with her to [name] but will be back in December. covid is worse there and travel is difficult.  Wants to reduce Wellbutrin DT dry mouth. Plan: She's afraid to reduce Luvox at this time DT fear of worsening OCD.  But will consider. Trintellix 10 mg 1 tablet in the morning with food and reduce fluvoxamine to 5 tablets nightly for 1 week  then reduce it to 4 tablets nightly. Also reduce Wellbutrin XL to 300 mg daily.    9-13 2022 appointment with the following noted: Back in [place] since July 14.  Broke arm a month ago and surgery.  It's all been rough adjustment.   B has cancer on his face and M fell taking him to the doctor.  Misses the water and weather of [name].   Cry a lot more since  menopause. Still depression and anxiety and OCD.  Asks about ketamine. On Wellbutrin 300, Luvox 300.  No Trintellix. Added Ativan 2 mg AM and HS and it helps.  More likely to get upset at night. Plan: Increase Luvox back to 400 mg daily.  She thinks she's worse on less. Continue Wellbutrin XL to 300 mg daily. 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  She tolerated it well without unusual nausea or vomiting headache or other somatic symptoms.  She did have the expected dissociation which gradually resolved over the course of the 2-hour period of observation.  She was a little  concerned about her balance given her cerebral palsy but has not noted unusual or unexpected problems.  She is motivated to can continue Spravato in hopes of reducing her depressive symptoms. 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  She also has treatment resistant OCD which is partially managed with medications but is still quite disabling.  She is tolerating the medications well.  She is sleeping adequately.  Her appetite is adequate.  She is not having suicidal thoughts.  She continues to wish for a better treatment for OCD that would give her some relief.  09/30/2021 appointment with the following noted: She received her first dose of Spravato 84 mg intranasally today.  She tolerated it well without unusual nausea, vomiting, or other somatic symptoms.  Dissociation as expected did occur and gradually resolved over the 2-hour period of observation.  She did have a mild headache today with the treatment and received ibuprofen 600 mg at her request.  We will follow this to see if it is a pattern Patient is still depressed.  She said she was late with her medicine today and today was a particularly depressing day.  However she notes that the Spravato has lifted her mood considerably even today.  She is hopeful that it will continue to be helpful.  No suicidal thoughts.  She has ongoing chronic anxiety and OCD at baseline.  10/04/21 appt noted: Patient received Spravato 84 mg for the second time today.  She tolerated it well without any unusual headache, nausea or vomiting or other somatic symptoms.  Dissociation did occur and she gradually Ryan resolution over the 2-hour period of observation. She did not have any unusual problems after she left the office last Spravato administration.  She did not have any specific problems with balance or walking.  She is at increased risk of that difficulty because of cerebral palsy.  So far she has not noticed much mood effect from  the medication beyond the first day of receiving it.  However she would like to continue Spravato in hopes of getting the antidepressant effect that is desired. Stress dealing with mother's behavior at party pt hosted.  Guilt over it.  10/07/2021 appointment noted: Patient received Spravato 84 mg for the second time today.  She tolerated it well without any unusual headache, nausea or vomiting or other somatic symptoms.  Dissociation did occur and she gradually [place] resolution over the 2-hour period of observation. She still is not sure about the antidepressant effect of Spravato.  Events over the holidays and demands, make it difficult to assess.  She still notes that the OCD tends to worsen the depression and vice versa.  She tolerates the Spravato well and wants to continue the trial.  10/15/2021 appointment with the following  noted: Patient received Spravato 84 mg for the second time today.  She tolerated it well without any unusual headache, nausea or vomiting or other somatic symptoms.  Dissociation did occur and she gradually [place] resolution over the 2-hour period of observation. Patient says it was somewhat difficult to evaluate the effect of the Spravato.  It was scheduled to be twice weekly for 4 weeks consecutively but the holidays have interfered with that administration.  She asked what specifically should be she should be looking for in order to assess improvement.  That was discussed.  The OCD is unchanged and the depression so far is not significantly different.  She still tolerates meds.  There have been no recent med changes  10/19/2021 appt noted: Patient received Spravato 84 mg for the second today.  She tolerated it well without any unusual headache, nausea or vomiting or other somatic symptoms.  Dissociation did occur and she gradually saw resolution over the 2-hour period of observation.   10/21/2021 appointment noted: Patient received Spravato 84 mg today.  She tolerated it well  without any unusual headache, nausea or vomiting or other somatic symptoms.  Dissociation did occur and she gradually saw resolution over the 2-hour period of observation.  She feels better than last week.  She is not as depressed and down.  She is still dealing with grief around the death of her cousin that was unexpected.  It is still difficult to tell how much the Spravato was doing but she is hopeful.  Anxiety is still present with the OCD.  She is not having suicidal thoughts.  She is not hopeless.  She wants to continue treatment.  10/25/2021 appointment with the following noted: Patient received Spravato 84 mg today.  She tolerated it well without any unusual headache, nausea or vomiting or other somatic symptoms.  Dissociation did occur and she gradually saw resolution over the 2-hour period of observation.  She does not typically find the dissociation very strong. She is beginning to think the Spravato is helping somewhat with the depression.  It has been difficult to tell with the holidays intervening as well as the death of her cousin.  She has not been able to get Spravato twice weekly for 4 weeks straight as typically planned.  However she is hopeful.  The OCD remains significant.  She still has a tendency to think very negatively.  She is not suicidal.  10/28/2021 appointment with the following noted: Patient received Spravato 84 mg today.  She tolerated it well without any unusual headache, nausea or vomiting or other somatic symptoms.  Dissociation did occur and she gradually saw resolution over the 2-hour period of observation.  She does not typically find the dissociation very strong. She is feeling more hopeful about the administration of Spravato.  She is having less depression she believes.  Still not dramatically different.  She still has a tendency to have a lot of anxiety and rumination and OCD.  She is not suicidal.  She is eager to continue the Spravato.  11/01/2021 appointment with  the following noted: Patient received Spravato 84 mg today.  She tolerated it well without any unusual headache, nausea or vomiting or other somatic symptoms.  Dissociation did occur and she gradually saw resolution over the 2-hour period of observation.  She does not typically find the dissociation very strong. She is continuing to see a little bit of improvement in depression with Spravato.  The anxiety remains but may be not as severe.  The  OCD remains markedly severe chronically.  She is not suicidal.  She is encouraged by the degree of improvement with Spravato and inability to enjoy things more and not be quite as ruminative.  11/04/2021 appt noted: Patient received Spravato 84 mg today.  She tolerated it well without any unusual headache, nausea or vomiting or other somatic symptoms.  Dissociation did occur and she gradually saw resolution over the 2-hour period of observation.  She does not typically find the dissociation very strong. No SE complaints with meds. She continues to feel hopeful about the Spravato.  She has less depression.  Because of a number of factors she is uncertain of the full benefit but thinks she is somewhat less depressed.  Her anxiety and OCD remain significant but a little better.  She is tolerating the medications and does not desire medicine change.  She is not currently complaining of insomnia.   11/08/2021 appointment the following noted: Patient received Spravato 84 mg today.  She tolerated it well without any unusual headache, nausea or vomiting or other somatic symptoms.  Dissociation did occur and she gradually saw resolution over the 2-hour period of observation.  She does not typically find the dissociation very strong. No SE complaints with meds. She feels the Spravato is helping somewhat.  She would like to see a greater effect.  However she is able to enjoy things.  She is productive at home.  She would like to see a lifting of a degree of sadness that  remains.  The anxiety and OCD remained largely unchanged.  She wondered about the dosing of Wellbutrin 300 mg a day and Luvox 300 mg a day and possible increases.  She has been at higher doses in the past.  She plans to start water therapy for her weakness and for her shoulder.  11/11/2021 appointment with the following noted: Patient received Spravato 84 mg today.  She tolerated it well without any unusual headache, nausea or vomiting or other somatic symptoms.  Dissociation did occur and she gradually saw resolution over the 2-hour period of observation.  She does not typically find the dissociation very strong. No SE complaints with meds. She feels the Spravato is clearly helping the depression.  She would like to see a more significant effect.  She is still having trouble thinking positive. Her energy is fair.  Concentration is good except for the problem with chronic obsessions. She has been taking Wellbutrin 300 mg in Luvox 300 mg for quite some time but has taken higher doses in the past.  We discussed that.  She would like to try higher doses in order to get a better effect if possible. We just increased the doses a couple of days ago.  No effect yet.  11/15/2021 appointment with the following noted: Patient received Spravato 84 mg today.  She tolerated it well without any unusual headache, nausea or vomiting or other somatic symptoms.  Dissociation did occur and she gradually saw resolution over the 2-hour period of observation.  She does not typically find the dissociation very strong. No SE complaints with meds. The patient is now convinced that the Spravato is helping the depression.  She would like to continue twice weekly Spravato this week if possible.  She has tolerated the increase in Wellbutrin to 450 mg daily and the increase and fluvoxamine to 400 mg daily without complications thus far.  The OCD and anxiety feed the depression to some extent. She spends approximately 2 hours daily  with checking  compulsions due to obsessions about causing harm to others.  For example fearing that when she has hit a pot hole that she may have hit a person and going back to check.  Checking corners and rooms out of fear that she may have harmed someone.  Other various checking compulsions.  She is hoping the increase in fluvoxamine to 400 mg will reduce that over the weeks to come.  She is not seeing a significant difference with the addition of the Spravato though she understands that was not expected.  She is more productive at home and more motivated and able to enjoy things more fully as a result of the Spravato treatment.  She is tolerating the medication  11/18/2021 appointment with the following noted: Patient received Spravato 84 mg today.  She tolerated it well without any unusual headache, nausea or vomiting or other somatic symptoms.  Dissociation did occur and she gradually saw resolution over the 2-hour period of observation.  She does not typically find the dissociation very strong. No SE complaints with meds. She clearly believes the Spravato has been helpful for the depression.  She wonders whether to continue to treatments weekly or to cut back to 1 weekly.  She would like to continue twice weekly in hopes of getting additional improvement in the depression because it is not resolved but it is difficult to get here twice a week in terms of arranging rides. She is recently increased Wellbutrin XL to 450 mg daily and fluvoxamine to 400 mg daily but they have not had time to have an official effect.  She is tolerating that well.  She is tolerating meds overwork overall well. The OCD remains the same as noted on 11/15/2021  11/25/21 appt noted: Patient received Spravato 84 mg today.  She tolerated it well without any unusual headache, nausea or vomiting or other somatic symptoms.  Dissociation did occur and she gradually saw resolution over the 2-hour period of observation.  She does not  typically find the dissociation very strong. No SE complaints with meds. She thinks the increase in Wellbutrin and Luvox have been potentially helpful for depression and OCD respectively.  It has been too early to see the full effect.  She is sleeping and eating well.  She is functioning at home.  She still spends a lot of time that is about 2 hours a day dealing with compulsive behaviors.  12/02/21 appt noted: Patient received Spravato 84 mg today.  She tolerated it well without any unusual headache, nausea or vomiting or other somatic symptoms.  Dissociation did occur and she gradually saw resolution over the 2-hour period of observation.  She does not typically find the dissociation very strong. No SE complaints with meds. Several losses and stressors recently that affect her sense of mood. However still sees significant benefit from the Spravato for her depression.  Wants to continue it. Suspect early  some benefit from the increased Wellbutrin for depression and Luvox for OCD. Tolerating meds. No complaints about the meds. Sleeping and eating well.  No new health concerns.  12/09/21 appt noted: Patient received Spravato 84 mg today.  She tolerated it well without any unusual headache, nausea or vomiting or other somatic symptoms.  Dissociation did occur and she gradually saw resolution over the 2-hour period of observation.  She does not typically find the dissociation very strong. No SE complaints with meds. Seeing noticeable improvement from increase fluvoxamine to 400 mg daily.  Tolerating meds without concerns over them. Depression  is stable with residual sx of easy guilt and easily stressed.  OCD contributes to depression but depression is not severe with less crying spells.  Productive at home with chores.  Enjoyed recent birthday.  Sleeping good. No new concerns.  12/23/2021 appointment noted: Patient received Spravato 84 mg today.  She tolerated it well without any unusual headache,  nausea or vomiting or other somatic symptoms.  Dissociation did occur and she gradually saw resolution over the 2-hour period of observation.  She does not typically find the dissociation very strong. No SE complaints with meds. Seeing noticeable improvement from increase fluvoxamine to 400 mg daily.  Tolerating meds without concerns over them. Her depression is somewhat improved with the Spravato.  She also feels generally a little lighter.  She is more motivated.  She is less overwhelmed by guilt.  The OCD is gradually improving but is still quite time-consuming as noted before.  She is sleeping well.  No side effects  12/30/2021 appointment with the following noted: Patient received Spravato 84 mg today.  She tolerated it well without any unusual headache, nausea or vomiting or other somatic symptoms.  Dissociation did occur and she gradually saw resolution over the 2-hour period of observation.  She does not typically find the dissociation very strong. No SE complaints with meds. Seeing noticeable improvement from increase fluvoxamine to 400 mg daily.  Tolerating meds without concerns over them. She is confident of her the improvement seen with Spravato.  She is less hopeless.  Guilt is marked remarkably improved.  She is not having any thoughts of death or dying.  She is more motivated for activities such as exercise which she is recently started.  She is sleeping well. The OCD remains severe but it is improving somewhat with the increase in fluvoxamine.  It is still consuming a couple hours per day.  01/10/22 apravato 84 admin  01/24/22 appt noted: Patient received Spravato 84 mg today.  She tolerated it well without any unusual headache, nausea or vomiting or other somatic symptoms.  Dissociation did occur and she gradually saw resolution over the 2-hour period of observation.  She does not typically find the dissociation very strong. No SE complaints with meds. Very tearful today.  Feels like  she has been suppressing emotion in the Spravato caused it to be released.  Discussed some stressors.  Overall still feels the medicine is helpful.  She has missed some of the scheduled Spravato treatments that were intended to be weekly due to circumstances beyond her control.  She is still struggling with OCD as previously noted but does believe the medications are helpful. Plan no med changes  01/31/2022 received Spravato 84 mg today  02/09/2022 appointment with the following noted: Patient received Spravato 84 mg today.  She tolerated it well without any unusual headache, nausea or vomiting or other somatic symptoms.  Dissociation did occur and she gradually saw resolution over the 2-hour period of observation.  She does not typically find the dissociation very strong. No SE complaints with meds. Spravato clearly helps depression and OCD but easily gets overwhelmed and tearful with fairly routine stressors.  Tolerating meds. Sleep and appetite is OK Asks to increase lorazepam to 2 mg AM and HS and 1mg  afternoon  02/16/22 appt noted: Patient received Spravato 84 mg today.  She tolerated it well without any unusual headache, nausea or vomiting or other somatic symptoms.  Dissociation did occur and she gradually saw resolution over the 2-hour period of observation.  She  does not typically find the dissociation very strong. No SE complaints with meds. She has chronic depesssion and OCD but is improved with Spravato, both dx versus before.  She has continued Luvox 400 mg and Wellbutrin 450 mg and is tolerating it.  Chronically easily stressed.  Tolerating all meds.  Doesn't like taking more meds.  Spending a couple hours daily with OCD.  No SI No med changes.  02/21/22 appt noted:   Doesn't like taking more meds.  Spending a couple hours daily with OCD.  No SI No med changes.  02/21/22 appt noted: Patient received Spravato 84 mg today.  She tolerated it well without any unusual headache, nausea or  vomiting or other somatic symptoms.  Dissociation did occur and she gradually saw resolution over the 2-hour period of observation.  She does not typically find the dissociation very strong. No SE complaints with meds. She has chronic depesssion and OCD but is improved with Spravato, both dx versus before.  She has continued Luvox 400 mg and Wellbutrin 450 mg and is tolerating it.  Chronically easily overwhelmed and doesn't know why.  Tolerating all meds. Wants to continue meds.  03/16/22 appt noted: Patient received Spravato 84 mg today.  She tolerated it well without any unusual headache, nausea or vomiting or other somatic symptoms.  Dissociation did occur and she gradually saw resolution over the 2-hour period of observation.  She does not typically find the dissociation very strong. No SE complaints with meds. Overall she still feels the Spravato has been helpful not only for her depression but also for her OCD which was somewhat unexpected.  OCD is still significant but it is less severe than prior to starting Spravato.  She is tolerating Luvox 400 mg and Wellbutrin 450 mg.  We discussed possible med adjustments.  03/23/22 appt noted: Patient received Spravato 84 mg today.  She tolerated it well without any unusual headache, nausea or vomiting or other somatic symptoms.  Dissociation did occur and she gradually saw resolution over the 2-hour period of observation.  She does not typically find the dissociation very strong. No SE complaints with meds. She is still depressed and still has OCD of course but is improved with the Spravato.  She is tolerating the medications well.  We had previously discussed the possibility of switching some of the Wellbutrin to [facility] and she is very interested in that in hopes of further improvement in depression and OCD.  She understands that Auvelity is not used for OCD on the label.  She is tolerating the medications.  She is still easily overwhelmed.  She is  sleeping and eating okay.. Plan: Reduce Wellbutrin XL to 300 mg AM and add Auvelity 1 tablet each AM  03/30/22 appt noted: Patient received Spravato 84 mg today.  She tolerated it well without any unusual headache, nausea or vomiting or other somatic symptoms.  Dissociation did occur and she gradually saw resolution over the 2-hour period of observation.  She does not typically find the dissociation very strong. No SE complaints with meds. She is still depressed and still has OCD of course but is improved with the Spravato.  She is tolerating the medications well.  No difference with Auvelity 1 AM so far and no SE.  Going on vacation on Saturday. 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 Sleep and appetite good. Plan: Increase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 Spravato.  Spends 2 hours daily and checking compulsions on her worst days but better when she travels.  She has been on higher doses of fluvoxamine  above the usual max of 400 mg daily in the past.    Disc SE.   She is tolerating the meds well  Continue  Luvox back to 400 mg nightly as of January 2023. Disc dosing higher than usual.  She feels this is increase has helped more with OCD which remains chronically severe.  Contiinue Auvelity 1 AM and  Wants to continue Wellbutrin XL 150 BID.  Option increase Auvelity BID and reduce Wellbutrin to 1 AM. But she didn't think it made a lot more difference than 1 Auveltiy daily when taken in summer 2023. There are few other alternative medication options that remain.   Disc DDI issues.    Disc Spravato DT TRD incl details and SE. Disc dosing and duration.  Pt with severe depression MADRS 36 on 06/29/21  Patient was administered Spravato 84 mg intranasally dosage today.  The patient experienced the typical dissociation which gradually resolved over the 2-hour period of observation.  There were no complications.  Specifically the patient did not have nausea or vomiting or headache.  Blood pressures remained within normal ranges at the 40-minute and 2-hour follow-up intervals.  By the time the 2-hour observation period was met the patient was alert and oriented and able to exit without assistance. She tends to have lingering sedative effects but not severe. .  See nursing note for further details. She is ready to be more consistent.  Didn't feel as well when off spravato.  We discussed the short-term risks associated with benzodiazepines including sedation and increased fall risk among others.  Discussed long-term side effect risk including dependence, potential withdrawal symptoms, and the potential eventual dose-related risk of dementia.  But recent studies from 2020 dispute this association between benzodiazepines and dementia risk. Newer studies in 2020 do not support an association with dementia. Disc this is high dose and not ideal.  Also  disc risk combining it with temazepam. Rec try when possible  gradually reduce HS lorazepam to 1 mg Hs.  Can continue lorazepam 2 mg AM and 1 mg in afternoon bc of chronic anxiety and it is helpful and tolerated. She can continue temazepam 30 mg nightly.  She tends to have a lot of anxious negative thoughts at night when she is trying to go to bed.  She is trying to reduce the dose.  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 She doesn't want to try this now.  Complaining of HA and history migraine.  Asks for increase imitrex and disc preventatives like propranolol ER imitrex to 100 mg prn migraine and propranolol ER for migraine prevention.  Supportive therapy dealing with some of the recent stressors including son's autism and recent issues of rebelliousness with him.    Plan no change indicated:   she agrees.  No obvious changes. Continue Auvelity 1 in AM Continue Wellbutrin XL 150 mg BID Fluvoxamine 100 mg AM and 300 mg PM above usual max bc med necessary Lorazepam 2 mg HS and prn 1 mg BID prn anxiety daily Temazepam 30 mg HS Propranolol ER 60 mg daily Imitrex prn migraine  Follow-up every week if possible and try to be more consistent. That is her plan.     Meredith Staggers, MD, DFAPA  Please see After Visit Summary for patient specific instructions.  Future Appointments  Date Time Provider Department Center  05/09/2023  4:00 PM Robley Fries, PhD CP-CP None  05/23/2023 10:00 AM Robley Fries, PhD CP-CP None  06/06/2023 11:00 AM Robley Fries, PhD CP-CP None  06/20/2023 11:00 AM Robley Fries, PhD CP-CP None  07/04/2023 11:00 AM Robley Fries, PhD CP-CP None  07/18/2023 11:00 AM Robley Fries, PhD CP-CP None  08/01/2023 11:00 AM Robley Fries, PhD CP-CP None    No orders of the defined types were placed in this encounter.    -------------------------------

## 2023-05-03 NOTE — Progress Notes (Signed)
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 Pt is being treated for Treatment Resistant Depression, Pt taken to treatment room. Pt's medication is charged through [employer].  Medication is stored behind 2 locked doors, it is never given to the pt until time of administration which is observed by the nurse. Disposed of per FDA/REMS regulations. All Spravato Treatments are documented in Spravato REMS per protocol of being a treatment center.    She was directed to the treatment room to get vitals taken first. Her last treatment was 04/18/2023. Initial vital signs are B/P at 3:05 PM 132/85, 78, Pt instructed to blow her nose and to recline back at 45 degrees. Pt given first nasal spray (28 mg) administered by pt observed by nurse. There were 5 minutes between each dose, total of 84 mg. Tolerated well. Pt's medication is delivered by [facility] in [place] and stored inside a safe behind a locked door as well. Spravato is a CIII medication and has to be only given at a treatment facility and observed by nurse as pt administered intranasally.  Assessed pt's 40 minute vital signs at 3:55 PM 99/70, 72.  Pt was taken to Dr. Alwyn Ren office and they discussed her care at the end of her treatment when her thoughts are clearer and her medication and moods. She does go to the bathroom at least once during her treatment. Pt was upset when she came in reporting she hasn't had a good day. No sedation and had slight feeling of being "high" she reports. Discharge vitals at 5:00 PM 112/76, 67. Pt stable for discharge.  Pt was observed on site a total of 120 minutes per FDA/REMS requirements. Pt was with nurse for clinical assessment 50 minutes. pt will call back to schedule next treatment.     LOT [id number] APR 2027

## 2023-05-09 ENCOUNTER — Ambulatory Visit: Payer: 59 | Admitting: Psychiatry

## 2023-05-11 ENCOUNTER — Ambulatory Visit: Payer: 59 | Admitting: Adult Health

## 2023-05-11 ENCOUNTER — Encounter: Payer: Self-pay | Admitting: Adult Health

## 2023-05-11 ENCOUNTER — Encounter: Payer: 59 | Admitting: Psychiatry

## 2023-05-11 ENCOUNTER — Ambulatory Visit: Payer: 59

## 2023-05-11 VITALS — BP 116/80 | HR 68

## 2023-05-11 DIAGNOSIS — F339 Major depressive disorder, recurrent, unspecified: Secondary | ICD-10-CM | POA: Diagnosis not present

## 2023-05-11 NOTE — Progress Notes (Signed)
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 Pt is being treated for Treatment Resistant Depression, Pt taken to treatment room. Pt's medication is charged through [employer].  Medication is stored behind 2 locked doors, it is never given to the pt until time of administration which is observed by the nurse. Disposed of per FDA/REMS regulations. All Spravato Treatments are documented in Spravato REMS per protocol of being a treatment center.    She was directed to the treatment room to get vitals taken first. Her last treatment was 04/18/2023. Initial vital signs are B/P at 3:10 PM 112/79, 73, Pt instructed to blow her nose and to recline back at 45 degrees. Pt given first nasal spray (28 mg) administered by pt observed by nurse. There were 5 minutes between each dose, total of 84 mg. Tolerated well. Pt's medication is delivered by [facility] in [place] and stored inside a safe behind a locked door as well. Spravato is a CIII medication and has to be only given at a treatment facility and observed by nurse as pt administered intranasally.  Assessed pt's 40 minute vital signs at 4:00 PM 111/79, 67.  Pt met with Yvette Rack, NP since [name] out of office and they discussed her care at the end of her treatment when her thoughts are clearer and her medication and moods. She does go to the bathroom at least once during her treatment. Pt was upset when she came in reporting she hasn't had a good day. No sedation and had slight feeling of being "high" she reports. Discharge vitals at 5:00 PM 116/80, 68. Pt stable for discharge.  Pt was observed on site a total of 120 minutes per FDA/REMS requirements. Pt was with nurse for clinical assessment 50 minutes. pt will be at [facility] next week and plans to return for her next treatment on Tuesday,  August 6th.      LOT [id number] APR 2027

## 2023-05-11 NOTE — Progress Notes (Signed)
Casey Diaz [id number] 1966/03/21 57 y.o.  Subjective:   Patient ID:  Casey Diaz Carsten is a 57 y.o. (DOB 26-Apr-1966) female.  Chief Complaint: No chief complaint on file.   HPI  [name] presents to the office today for follow-up of treatment resistant depression (TRD).   Spravato treatment    Patient was administered Spravato 84 mg intranasally today. Patient was observed by provider throughout [facility] treatment. The patient experienced the typical dissociation which gradually resolved over the 2-hour period of observation. There were no complications. Specifically, the patient did not have any untoward side effects - feeling disconnected from themself, their thoughts, feelings and things around them, dizziness, nausea, feeling sleepy, decreased feeling of sensitivity (numbness) spinning sensation, feeling anxious, lack of energy, increased blood pressure, feeling happy or very excited, or headache. Blood pressures remained within normal ranges at the 40-minute and 2-hour follow-up intervals. By the time the 2-hour observation period was met the patient was alert and oriented and able to exit without assistance. Patient willing to continue Spravato administration for the treatment of resistant depression. See nursing note for further details.   ECT-MADRS    Flowsheet Row Office Visit from 06/29/2021 in [facility]  MADRS Total Score 36      Flowsheet Row Admission (Discharged) from 06/11/2021 in [facility]  C-SSRS RISK CATEGORY No Risk        Review of Systems:  Review of Systems  Musculoskeletal:  Negative for gait problem.  Neurological:  Negative for tremors.  Psychiatric/Behavioral:         Please refer to HPI    Medications: I have reviewed the patient's current medications.  Current Outpatient Medications  Medication Sig Dispense Refill   Abaloparatide (TYMLOS) 3120 MCG/1.56ML SOPN Inject into the skin.      Azelastine-Fluticasone 137-50 MCG/ACT SUSP Place 1-2 sprays into both nostrils daily.     baclofen (LIORESAL) 10 MG tablet Take 20 mg by mouth at bedtime as needed for muscle spasms.     buPROPion (WELLBUTRIN XL) 150 MG 24 hr tablet Take 1 tablet (150 mg total) by mouth 2 (two) times daily.     Dextromethorphan-buPROPion ER (AUVELITY) 45-105 MG TBCR Take 1 tablet by mouth daily. 30 tablet 1   dicyclomine (BENTYL) 10 MG capsule Take 10 mg by mouth daily.     docusate sodium (COLACE) 100 MG capsule Take 1 capsule (100 mg total) by mouth 2 (two) times daily. (Patient taking differently: Take 100 mg by mouth daily.) 10 capsule 0   Esketamine HCl, 84 MG Dose, (SPRAVATO, 84 MG DOSE,) 28 MG/DEVICE SOPK USE 3 SPRAYS IN EACH NOSTRIL ONCE A WEEK 3 each 2   fexofenadine (ALLEGRA) 180 MG tablet Take 180 mg by mouth daily.     fluvoxaMINE (LUVOX) 100 MG tablet TAKE 1 TABLET IN THE AM AND 3 TABLETS AT NIGHT 360 tablet 0   hydrocortisone (ANUSOL-HC) 2.5 % rectal cream Place rectally 2 (two) times daily. x 7-14 days 30 g 0   ketotifen (ZADITOR) 0.025 % ophthalmic solution Place 3 drops into both eyes at bedtime.     LORazepam (ATIVAN) 1 MG tablet TAKE 1-2 IN THE AM AND 1-2 TABLETS EVERY NIGHT AT BEDTIME AND 1 TABLET IN AFTERNOON WHEN NEEDED FOR ANXIETY AND SLEEP 150 tablet 1   magnesium gluconate (MAGONATE) 500 MG tablet Take 500 mg by mouth daily.     MIBELAS 24 FE 1-20 MG-MCG(24) CHEW Chew 1 tablet by mouth at  bedtime as needed (bowel regularity).     Multiple Vitamins-Minerals (ADULT GUMMY PO) Take 2 tablets by mouth in the morning.     nitrofurantoin (MACRODANTIN) 100 MG capsule Take 100 mg by mouth as needed (For urinary tract infection.).      oxyCODONE-acetaminophen (PERCOCET/ROXICET) 5-325 MG tablet Take 1-2 tablets by mouth every 6 (six) hours as needed for severe pain. 50 tablet 0   polyethylene glycol (MIRALAX / GLYCOLAX) packet Take 17 g by mouth daily as needed for mild constipation. 14 each 0    propranolol ER (INDERAL LA) 60 MG 24 hr capsule TAKE 1 CAPSULE BY MOUTH EVERY DAY 30 capsule 5   psyllium (METAMUCIL) 58.6 % powder Take 1 packet by mouth daily as needed (constipation).     SUMAtriptan (IMITREX) 100 MG tablet TAKE 1 TABLET (100 MG TOTAL) BY MOUTH EVERY 2 (TWO) HOURS AS NEEDED FOR MIGRAINE. MAY REPEAT IN 2 HOURS IF HEADACHE PERSISTS OR RECURS. 10 tablet 1   temazepam (RESTORIL) 30 MG capsule TAKE 1 CAPSULE BY MOUTH AT BEDTIME AS NEEDED FOR SLEEP 30 capsule 2   Vitamin D-Vitamin K (VITAMIN K2-VITAMIN D3 PO) Take 1-2 sprays by mouth daily.     No current facility-administered medications for this visit.    Medication Side Effects: None  Allergies:  Allergies  Allergen Reactions   Hydrocodone Itching   Sulfamethoxazole-Trimethoprim Itching   Dust Mite Extract Other (See Comments)    Sneezing, watery eyes, runny nose   Latex Itching   Other Other (See Comments)    PT IS ALLERGIC TO CAT DANDER AND RAGWEED - Sneezing, watery eyes, runny nose    Pollen Extract Other (See Comments)    Sneezing, watery eyes, runny nose     Past Medical History:  Diagnosis Date   Abnormal Pap smear 2011   hpv/mild dysplasia,cin1   Anxiety    Cerebral palsy (HCC)    right arm/leg   Cystocele    Depression    Headache    Neuromuscular disorder (HCC)    Cerebral Palsy   OCD (obsessive compulsive disorder)    Osteoporosis    Uterine prolaps     Past Medical History, Surgical history, Social history, and Family history were reviewed and updated as appropriate.   Please see review of systems for further details on the patient's review from today.   Objective:   Physical Exam:  LMP  (LMP Unknown)   Physical Exam Constitutional:      General: She is not in acute distress. Musculoskeletal:        General: No deformity.  Neurological:     Mental Status: She is alert and oriented to person, place, and time.     Coordination: Coordination normal.  Psychiatric:        Attention  and Perception: Attention and perception normal. She does not perceive auditory or visual hallucinations.        Mood and Affect: Affect is not labile, blunt, angry or inappropriate.        Speech: Speech normal.        Behavior: Behavior normal.        Thought Content: Thought content normal. Thought content is not paranoid or delusional. Thought content does not include homicidal or suicidal ideation. Thought content does not include homicidal or suicidal plan.        Cognition and Memory: Cognition and memory normal.        Judgment: Judgment normal.     Comments: Insight intact  Lab Review:     Component Value Date/Time   NA 138 06/11/2021 0606   K 4.0 06/11/2021 0606   CL 107 06/11/2021 0606   CO2 26 06/11/2021 0606   GLUCOSE 90 06/11/2021 0606   BUN 18 06/11/2021 0606   CREATININE 0.81 06/11/2021 0606   CALCIUM 9.4 06/11/2021 0606   PROT 6.5 06/11/2021 0606   ALBUMIN 3.3 (L) 06/11/2021 0606   AST 17 06/11/2021 0606   ALT 14 06/11/2021 0606   ALKPHOS 141 (H) 06/11/2021 0606   BILITOT 0.2 (L) 06/11/2021 0606   GFRNONAA >60 06/11/2021 0606   GFRAA >60 07/09/2016 0438       Component Value Date/Time   WBC 5.8 06/11/2021 0606   RBC 4.12 06/11/2021 0606   HGB 12.5 06/11/2021 0606   HCT 39.7 06/11/2021 0606   PLT 299 06/11/2021 0606   MCV 96.4 06/11/2021 0606   MCH 30.3 06/11/2021 0606   MCHC 31.5 06/11/2021 0606   RDW 13.9 06/11/2021 0606   LYMPHSABS 1.9 06/11/2021 0606   MONOABS 0.5 06/11/2021 0606   EOSABS 0.1 06/11/2021 0606   BASOSABS 0.0 06/11/2021 0606    No results found for: "POCLITH", "LITHIUM"   No results found for: "PHENYTOIN", "PHENOBARB", "VALPROATE", "CBMZ"   .res Assessment: Plan:    Recommendations/Plan   Continue Spravato treatment.   Patient advised to contact office with any questions, adverse effects, or acute worsening in signs and symptoms.  Diagnoses and all orders for this visit:  Recurrent major depression resistant to  treatment [facility])     Please see After Visit Summary for patient specific instructions.  Future Appointments  Date Time Provider Department Center  05/23/2023 10:00 AM Robley Fries, PhD CP-CP None  05/23/2023  2:30 PM Cottle, Steva Ready., MD CP-CP None  05/23/2023  2:30 PM CP-NURSE CP-CP None  06/06/2023 11:00 AM Robley Fries, PhD CP-CP None  06/20/2023 11:00 AM Robley Fries, PhD CP-CP None  07/04/2023 11:00 AM Robley Fries, PhD CP-CP None  07/18/2023 11:00 AM Robley Fries, PhD CP-CP None  08/01/2023 11:00 AM Robley Fries, PhD CP-CP None    No orders of the defined types were placed in this encounter.   -------------------------------

## 2023-05-23 ENCOUNTER — Ambulatory Visit (INDEPENDENT_AMBULATORY_CARE_PROVIDER_SITE_OTHER): Payer: 59 | Admitting: Psychiatry

## 2023-05-23 ENCOUNTER — Ambulatory Visit: Payer: 59 | Admitting: Psychiatry

## 2023-05-23 ENCOUNTER — Ambulatory Visit: Payer: 59

## 2023-05-23 ENCOUNTER — Encounter: Payer: Self-pay | Admitting: Psychiatry

## 2023-05-23 VITALS — BP 118/78 | HR 72

## 2023-05-23 DIAGNOSIS — Z638 Other specified problems related to primary support group: Secondary | ICD-10-CM | POA: Diagnosis not present

## 2023-05-23 DIAGNOSIS — F422 Mixed obsessional thoughts and acts: Secondary | ICD-10-CM | POA: Diagnosis not present

## 2023-05-23 DIAGNOSIS — G43009 Migraine without aura, not intractable, without status migrainosus: Secondary | ICD-10-CM

## 2023-05-23 DIAGNOSIS — F4010 Social phobia, unspecified: Secondary | ICD-10-CM | POA: Diagnosis not present

## 2023-05-23 DIAGNOSIS — F339 Major depressive disorder, recurrent, unspecified: Secondary | ICD-10-CM

## 2023-05-23 DIAGNOSIS — Z636 Dependent relative needing care at home: Secondary | ICD-10-CM

## 2023-05-23 DIAGNOSIS — F5105 Insomnia due to other mental disorder: Secondary | ICD-10-CM

## 2023-05-23 DIAGNOSIS — G809 Cerebral palsy, unspecified: Secondary | ICD-10-CM
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
## 2023-05-23 NOTE — Progress Notes (Signed)
Casey Diaz [id number] 1966-09-04 57 y.o.    Subjective:   Patient ID:  Casey Diaz is a 57 y.o. (DOB 07-Feb-1966) female.  Chief Complaint:  Chief Complaint  Patient presents with   Follow-up   Depression   Anxiety     HPI Casey Diaz presents to the office today for follow-up of OCD and severe anxiety.     December 2019 visit the following was noted: No meds were changed. Lives in [name] and back for followup.  Sx are about the same.  Has to take meds with different sizes. Pt reports that mood is Anxious and Depressed and describes anxiety as Severe. Anxiety symptoms include: Excessive Worry, Obsessive Compulsive Symptoms:   Checking,,. Pt reports has interrupted sleep and nocturia. Pt reports that appetite is good. Pt reports that energy is no change and down slightly. Concentration is down slightly. Suicidal thoughts:  denied by patient. Loves the environment of [name] but misses some things there.  She's not able to work there.  H works there and likes it.  Struggled with not working, feels isolated and not up to task of meeting people.  Does attend a church and met a friend who's been helpful.  Leaving for [name] on 10/16/18.   04/09/2020 appointment the following is noted:  Staying another year in [name] bc Covid and other things. Last few months a lot of crying spells.  Is in menopause. Wonders about med changes though is nervous about it.  Crying spells associated with depressing thoughts more than stress or OCD.   Covid really hard on everyone and couldn't see family for 18 mos.  Family still very dysfunctional. No close friends in part due to OCD and depression. Son high Autism spectrum with ADHD and anxiety and she's with him all the time. Greater health problems with CP so more pains.   05/15/20 appt with the following noted: [name] for menopause and helps some. Still depressed.  Chronically. In [place] for 2 more weeks then to [name] for another  year. A lot of stressors lately triggering more checking and anxiety.   OCD is her CC now and seems.  Got worse DT stress.   Stressed with Asberger's son and her health.  H works a lot.  Her FOO still stress. Plan: Trintellix 10 mg 1 tablet in the morning with food and reduce fluvoxamine to 5 tablets nightly for 1 week  then reduce it to 4 tablets nightly.   07/02/20 appt with the following noted: Decided not to get Trintellix bc difficulty getting it. It is available.  There.  Wants to start it now.   Both depression and OCD are severe.  Not suicidal in intent or plan. Did not take samples with her to [name] but will be back in December. covid is worse there and travel is difficult.  Wants to reduce Wellbutrin DT dry mouth. Plan: She's afraid to reduce Luvox at this time DT fear of worsening OCD.  But will consider. Trintellix 10 mg 1 tablet in the morning with food and reduce fluvoxamine to 5 tablets nightly for 1 week  then reduce it to 4 tablets nightly. Also reduce Wellbutrin XL to 300 mg daily.    9-13 2022 appointment with the following noted: Back in [place] since July 14.  Broke arm a month ago and surgery.  It's all been rough adjustment.   B has cancer on his face and M fell taking him to the doctor.  Misses the water  and weather of [name].   Cry a lot more since menopause. Still depression and anxiety and OCD.  Asks about ketamine. On Wellbutrin 300, Luvox 300.  No Trintellix. Added Ativan 2 mg AM and HS and it helps.  More likely to get upset at night. Plan: Increase Luvox back to 400 mg daily.  She thinks she's worse on less. Continue Wellbutrin XL to 300 mg daily. 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  She tolerated it well without unusual nausea or vomiting headache or other somatic symptoms.  She did have the expected dissociation which gradually resolved over the course  of the 2-hour period of observation.  She was a little concerned about her balance given her cerebral palsy but has not noted unusual or unexpected problems.  She is motivated to can continue Spravato in hopes of reducing her depressive symptoms. 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  She also has treatment resistant OCD which is partially managed with medications but is still quite disabling.  She is tolerating the medications well.  She is sleeping adequately.  Her appetite is adequate.  She is not having suicidal thoughts.  She continues to wish for a better treatment for OCD that would give her some relief.  09/30/2021 appointment with the following noted: She received her first dose of Spravato 84 mg intranasally today.  She tolerated it well without unusual nausea, vomiting, or other somatic symptoms.  Dissociation as expected did occur and gradually resolved over the 2-hour period of observation.  She did have a mild headache today with the treatment and received ibuprofen 600 mg at her request.  We will follow this to see if it is a pattern Patient is still depressed.  She said she was late with her medicine today and today was a particularly depressing day.  However she notes that the Spravato has lifted her mood considerably even today.  She is hopeful that it will continue to be helpful.  No suicidal thoughts.  She has ongoing chronic anxiety and OCD at baseline.  10/04/21 appt noted: Patient received Spravato 84 mg for the second time today.  She tolerated it well without any unusual headache, nausea or vomiting or other somatic symptoms.  Dissociation did occur and she gradually [place] resolution over the 2-hour period of observation. She did not have any unusual problems after she left the office last Spravato administration.  She did not have any specific problems with balance or walking.  She is at increased risk of that difficulty because of cerebral  palsy.  So far she has not noticed much mood effect from the medication beyond the first day of receiving it.  However she would like to continue Spravato in hopes of getting the antidepressant effect that is desired. Stress dealing with mother's behavior at party pt hosted.  Guilt over it.  10/07/2021 appointment noted: Patient received Spravato 84 mg for the second time today.  She tolerated it well without any unusual headache, nausea or vomiting or other somatic symptoms.  Dissociation did occur and she gradually [place] resolution over the 2-hour period of observation. She still is not sure about the antidepressant effect of Spravato.  Events over the holidays and demands, make it difficult to assess.  She still notes that the OCD tends to worsen the depression and vice versa.  She tolerates the Spravato well and  wants to continue the trial.  10/15/2021 appointment with the following noted: Patient received Spravato 84 mg for the second time today.  She tolerated it well without any unusual headache, nausea or vomiting or other somatic symptoms.  Dissociation did occur and she gradually [place] resolution over the 2-hour period of observation. Patient says it was somewhat difficult to evaluate the effect of the Spravato.  It was scheduled to be twice weekly for 4 weeks consecutively but the holidays have interfered with that administration.  She asked what specifically should be she should be looking for in order to assess improvement.  That was discussed.  The OCD is unchanged and the depression so far is not significantly different.  She still tolerates meds.  There have been no recent med changes  10/19/2021 appt noted: Patient received Spravato 84 mg for the second today.  She tolerated it well without any unusual headache, nausea or vomiting or other somatic symptoms.  Dissociation did occur and she gradually saw resolution over the 2-hour period of observation.   10/21/2021 appointment noted: Patient  received Spravato 84 mg today.  She tolerated it well without any unusual headache, nausea or vomiting or other somatic symptoms.  Dissociation did occur and she gradually saw resolution over the 2-hour period of observation.  She feels better than last week.  She is not as depressed and down.  She is still dealing with grief around the death of her cousin that was unexpected.  It is still difficult to tell how much the Spravato was doing but she is hopeful.  Anxiety is still present with the OCD.  She is not having suicidal thoughts.  She is not hopeless.  She wants to continue treatment.  10/25/2021 appointment with the following noted: Patient received Spravato 84 mg today.  She tolerated it well without any unusual headache, nausea or vomiting or other somatic symptoms.  Dissociation did occur and she gradually saw resolution over the 2-hour period of observation.  She does not typically find the dissociation very strong. She is beginning to think the Spravato is helping somewhat with the depression.  It has been difficult to tell with the holidays intervening as well as the death of her cousin.  She has not been able to get Spravato twice weekly for 4 weeks straight as typically planned.  However she is hopeful.  The OCD remains significant.  She still has a tendency to think very negatively.  She is not suicidal.  10/28/2021 appointment with the following noted: Patient received Spravato 84 mg today.  She tolerated it well without any unusual headache, nausea or vomiting or other somatic symptoms.  Dissociation did occur and she gradually saw resolution over the 2-hour period of observation.  She does not typically find the dissociation very strong. She is feeling more hopeful about the administration of Spravato.  She is having less depression she believes.  Still not dramatically different.  She still has a tendency to have a lot of anxiety and rumination and OCD.  She is not suicidal.  She is eager to  continue the Spravato.  11/01/2021 appointment with the following noted: Patient received Spravato 84 mg today.  She tolerated it well without any unusual headache, nausea or vomiting or other somatic symptoms.  Dissociation did occur and she gradually saw resolution over the 2-hour period of observation.  She does not typically find the dissociation very strong. She is continuing to see a little bit of improvement in depression with Spravato.  The anxiety remains but may be not as severe.  The OCD remains markedly severe chronically.  She is not suicidal.  She is encouraged by the degree of improvement with Spravato and inability to enjoy things more and not be quite as ruminative.  11/04/2021 appt noted: Patient received Spravato 84 mg today.  She tolerated it well without any unusual headache, nausea or vomiting or other somatic symptoms.  Dissociation did occur and she gradually saw resolution over the 2-hour period of observation.  She does not typically find the dissociation very strong. No SE complaints with meds. She continues to feel hopeful about the Spravato.  She has less depression.  Because of a number of factors she is uncertain of the full benefit but thinks she is somewhat less depressed.  Her anxiety and OCD remain significant but a little better.  She is tolerating the medications and does not desire medicine change.  She is not currently complaining of insomnia.   11/08/2021 appointment the following noted: Patient received Spravato 84 mg today.  She tolerated it well without any unusual headache, nausea or vomiting or other somatic symptoms.  Dissociation did occur and she gradually saw resolution over the 2-hour period of observation.  She does not typically find the dissociation very strong. No SE complaints with meds. She feels the Spravato is helping somewhat.  She would like to see a greater effect.  However she is able to enjoy things.  She is productive at home.  She would like  to see a lifting of a degree of sadness that remains.  The anxiety and OCD remained largely unchanged.  She wondered about the dosing of Wellbutrin 300 mg a day and Luvox 300 mg a day and possible increases.  She has been at higher doses in the past.  She plans to start water therapy for her weakness and for her shoulder.  11/11/2021 appointment with the following noted: Patient received Spravato 84 mg today.  She tolerated it well without any unusual headache, nausea or vomiting or other somatic symptoms.  Dissociation did occur and she gradually saw resolution over the 2-hour period of observation.  She does not typically find the dissociation very strong. No SE complaints with meds. She feels the Spravato is clearly helping the depression.  She would like to see a more significant effect.  She is still having trouble thinking positive. Her energy is fair.  Concentration is good except for the problem with chronic obsessions. She has been taking Wellbutrin 300 mg in Luvox 300 mg for quite some time but has taken higher doses in the past.  We discussed that.  She would like to try higher doses in order to get a better effect if possible. We just increased the doses a couple of days ago.  No effect yet.  11/15/2021 appointment with the following noted: Patient received Spravato 84 mg today.  She tolerated it well without any unusual headache, nausea or vomiting or other somatic symptoms.  Dissociation did occur and she gradually saw resolution over the 2-hour period of observation.  She does not typically find the dissociation very strong. No SE complaints with meds. The patient is now convinced that the Spravato is helping the depression.  She would like to continue twice weekly Spravato this week if possible.  She has tolerated the increase in Wellbutrin to 450 mg daily and the increase and fluvoxamine to 400 mg daily without complications thus far.  The OCD and anxiety feed the depression to  some  extent. She spends approximately 2 hours daily with checking compulsions due to obsessions about causing harm to others.  For example fearing that when she has hit a pot hole that she may have hit a person and going back to check.  Checking corners and rooms out of fear that she may have harmed someone.  Other various checking compulsions.  She is hoping the increase in fluvoxamine to 400 mg will reduce that over the weeks to come.  She is not seeing a significant difference with the addition of the Spravato though she understands that was not expected.  She is more productive at home and more motivated and able to enjoy things more fully as a result of the Spravato treatment.  She is tolerating the medication  11/18/2021 appointment with the following noted: Patient received Spravato 84 mg today.  She tolerated it well without any unusual headache, nausea or vomiting or other somatic symptoms.  Dissociation did occur and she gradually saw resolution over the 2-hour period of observation.  She does not typically find the dissociation very strong. No SE complaints with meds. She clearly believes the Spravato has been helpful for the depression.  She wonders whether to continue to treatments weekly or to cut back to 1 weekly.  She would like to continue twice weekly in hopes of getting additional improvement in the depression because it is not resolved but it is difficult to get here twice a week in terms of arranging rides. She is recently increased Wellbutrin XL to 450 mg daily and fluvoxamine to 400 mg daily but they have not had time to have an official effect.  She is tolerating that well.  She is tolerating meds overwork overall well. The OCD remains the same as noted on 11/15/2021  11/25/21 appt noted: Patient received Spravato 84 mg today.  She tolerated it well without any unusual headache, nausea or vomiting or other somatic symptoms.  Dissociation did occur and she gradually saw resolution over the  2-hour period of observation.  She does not typically find the dissociation very strong. No SE complaints with meds. She thinks the increase in Wellbutrin and Luvox have been potentially helpful for depression and OCD respectively.  It has been too early to see the full effect.  She is sleeping and eating well.  She is functioning at home.  She still spends a lot of time that is about 2 hours a day dealing with compulsive behaviors.  12/02/21 appt noted: Patient received Spravato 84 mg today.  She tolerated it well without any unusual headache, nausea or vomiting or other somatic symptoms.  Dissociation did occur and she gradually saw resolution over the 2-hour period of observation.  She does not typically find the dissociation very strong. No SE complaints with meds. Several losses and stressors recently that affect her sense of mood. However still sees significant benefit from the Spravato for her depression.  Wants to continue it. Suspect early  some benefit from the increased Wellbutrin for depression and Luvox for OCD. Tolerating meds. No complaints about the meds. Sleeping and eating well.  No new health concerns.  12/09/21 appt noted: Patient received Spravato 84 mg today.  She tolerated it well without any unusual headache, nausea or vomiting or other somatic symptoms.  Dissociation did occur and she gradually saw resolution over the 2-hour period of observation.  She does not typically find the dissociation very strong. No SE complaints with meds. Seeing noticeable improvement from increase fluvoxamine to  400 mg daily.  Tolerating meds without concerns over them. Depression is stable with residual sx of easy guilt and easily stressed.  OCD contributes to depression but depression is not severe with less crying spells.  Productive at home with chores.  Enjoyed recent birthday.  Sleeping good. No new concerns.  12/23/2021 appointment noted: Patient received Spravato 84 mg today.  She  tolerated it well without any unusual headache, nausea or vomiting or other somatic symptoms.  Dissociation did occur and she gradually saw resolution over the 2-hour period of observation.  She does not typically find the dissociation very strong. No SE complaints with meds. Seeing noticeable improvement from increase fluvoxamine to 400 mg daily.  Tolerating meds without concerns over them. Her depression is somewhat improved with the Spravato.  She also feels generally a little lighter.  She is more motivated.  She is less overwhelmed by guilt.  The OCD is gradually improving but is still quite time-consuming as noted before.  She is sleeping well.  No side effects  12/30/2021 appointment with the following noted: Patient received Spravato 84 mg today.  She tolerated it well without any unusual headache, nausea or vomiting or other somatic symptoms.  Dissociation did occur and she gradually saw resolution over the 2-hour period of observation.  She does not typically find the dissociation very strong. No SE complaints with meds. Seeing noticeable improvement from increase fluvoxamine to 400 mg daily.  Tolerating meds without concerns over them. She is confident of her the improvement seen with Spravato.  She is less hopeless.  Guilt is marked remarkably improved.  She is not having any thoughts of death or dying.  She is more motivated for activities such as exercise which she is recently started.  She is sleeping well. The OCD remains severe but it is improving somewhat with the increase in fluvoxamine.  It is still consuming a couple hours per day.  01/10/22 apravato 84 admin  01/24/22 appt noted: Patient received Spravato 84 mg today.  She tolerated it well without any unusual headache, nausea or vomiting or other somatic symptoms.  Dissociation did occur and she gradually saw resolution over the 2-hour period of observation.  She does not typically find the dissociation very strong. No SE  complaints with meds. Very tearful today.  Feels like she has been suppressing emotion in the Spravato caused it to be released.  Discussed some stressors.  Overall still feels the medicine is helpful.  She has missed some of the scheduled Spravato treatments that were intended to be weekly due to circumstances beyond her control.  She is still struggling with OCD as previously noted but does believe the medications are helpful. Plan no med changes  01/31/2022 received Spravato 84 mg today  02/09/2022 appointment with the following noted: Patient received Spravato 84 mg today.  She tolerated it well without any unusual headache, nausea or vomiting or other somatic symptoms.  Dissociation did occur and she gradually saw resolution over the 2-hour period of observation.  She does not typically find the dissociation very strong. No SE complaints with meds. Spravato clearly helps depression and OCD but easily gets overwhelmed and tearful with fairly routine stressors.  Tolerating meds. Sleep and appetite is OK Asks to increase lorazepam to 2 mg AM and HS and 1mg  afternoon  02/16/22 appt noted: Patient received Spravato 84 mg today.  She tolerated it well without any unusual headache, nausea or vomiting or other somatic symptoms.  Dissociation did occur and she  gradually saw resolution over the 2-hour period of observation.  She does not typically find the dissociation very strong. No SE complaints with meds. She has chronic depesssion and OCD but is improved with Spravato, both dx versus before.  She has continued Luvox 400 mg and Wellbutrin 450 mg and is tolerating it.  Chronically easily stressed.  Tolerating all meds.  Doesn't like taking more meds.  Spending a couple hours daily with OCD.  No SI No med changes.  02/21/22 appt noted:   Doesn't like taking more meds.  Spending a couple hours daily with OCD.  No SI No med changes.  02/21/22 appt noted: Patient received Spravato 84 mg today.  She  tolerated it well without any unusual headache, nausea or vomiting or other somatic symptoms.  Dissociation did occur and she gradually saw resolution over the 2-hour period of observation.  She does not typically find the dissociation very strong. No SE complaints with meds. She has chronic depesssion and OCD but is improved with Spravato, both dx versus before.  She has continued Luvox 400 mg and Wellbutrin 450 mg and is tolerating it.  Chronically easily overwhelmed and doesn't know why.  Tolerating all meds. Wants to continue meds.  03/16/22 appt noted: Patient received Spravato 84 mg today.  She tolerated it well without any unusual headache, nausea or vomiting or other somatic symptoms.  Dissociation did occur and she gradually saw resolution over the 2-hour period of observation.  She does not typically find the dissociation very strong. No SE complaints with meds. Overall she still feels the Spravato has been helpful not only for her depression but also for her OCD which was somewhat unexpected.  OCD is still significant but it is less severe than prior to starting Spravato.  She is tolerating Luvox 400 mg and Wellbutrin 450 mg.  We discussed possible med adjustments.  03/23/22 appt noted: Patient received Spravato 84 mg today.  She tolerated it well without any unusual headache, nausea or vomiting or other somatic symptoms.  Dissociation did occur and she gradually saw resolution over the 2-hour period of observation.  She does not typically find the dissociation very strong. No SE complaints with meds. She is still depressed and still has OCD of course but is improved with the Spravato.  She is tolerating the medications well.  We had previously discussed the possibility of switching some of the Wellbutrin to [facility] and she is very interested in that in hopes of further improvement in depression and OCD.  She understands that Auvelity is not used for OCD on the label.  She is tolerating the  medications.  She is still easily overwhelmed.  She is sleeping and eating okay.. Plan: Reduce Wellbutrin XL to 300 mg AM and add Auvelity 1 tablet each AM  03/30/22 appt noted: Patient received Spravato 84 mg today.  She tolerated it well without any unusual headache, nausea or vomiting or other somatic symptoms.  Dissociation did occur and she gradually saw resolution over the 2-hour period of observation.  She does not typically find the dissociation very strong. No SE complaints with meds. She is still depressed and still has OCD of course but is improved with the Spravato.  She is tolerating the medications well.  No difference with Auvelity 1 AM so far and no SE.  Going on vacation on Saturday. 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 results found for: "PHENYTOIN", "PHENOBARB", "VALPROATE", "CBMZ"   .res Assessment: Plan:    Terrika "[name]" was seen today for follow-up, depression and  anxiety.  Diagnoses and all orders for this visit:  Recurrent major depression resistant to treatment [facility])  Mixed obsessional thoughts and acts  Social anxiety disorder  Migraine without aura and without status migrainosus, not intractable  Insomnia due to mental condition  Caregiver stress    Both primary Dx of OCD and major depression are TR and marked.  Impaired function but less so with Spravato re: depression..   She is receiving Spravato 84 mg weekly and marked improvement in the depression..  she feels it also helps OCD somewhat.  However still easily overwhelmed with low stress tolerance.  The OCD is improved with the increase in fluvoxamine and with Spravato.  Spends 2 hours daily and checking compulsions on her worst days but better when she travels.  She has been on higher doses of fluvoxamine above the usual max of 400 mg daily in the past.    Disc SE.   She is tolerating the meds well  Continue  Luvox back to 400 mg nightly as of January 2023. Disc dosing higher than usual.  She feels this is increase has helped more with OCD which remains chronically severe.  Contiinue Auvelity 1 AM and  Wants to continue Wellbutrin XL 150 BID.  Option increase Auvelity BID and reduce Wellbutrin to 1 AM. But she didn't think it made a lot more difference than 1 Auveltiy daily when taken in summer 2023. There are few other alternative medication options that remain.   Disc DDI issues.    Disc Spravato DT TRD incl details and SE. Disc dosing and duration.  Pt with severe depression MADRS 36 on 06/29/21  Patient was administered Spravato 84 mg intranasally dosage today.  The patient experienced the typical dissociation which gradually resolved over the 2-hour period of observation.  There were no complications.  Specifically the patient did not have nausea or  vomiting or headache.  Blood pressures remained within normal ranges at the 40-minute and 2-hour follow-up intervals.  By the  time the 2-hour observation period was met the patient was alert and oriented and able to exit without assistance. She tends to have lingering sedative effects but not severe. .  See nursing note for further details.  Didn't feel as well when off spravato but disc family issurs interfering with frequency.  We discussed the short-term risks associated with benzodiazepines including sedation and increased fall risk among others.  Discussed long-term side effect risk including dependence, potential withdrawal symptoms, and the potential eventual dose-related risk of dementia.  But recent studies from 2020 dispute this association between benzodiazepines and dementia risk. Newer studies in 2020 do not support an association with dementia. Disc this is high dose and not ideal.  Also disc risk combining it with temazepam. Rec try when possible gradually reduce HS lorazepam to 1 mg Hs.  Can continue lorazepam 2 mg AM and 1 mg in afternoon bc of chronic anxiety and it is helpful and tolerated. She can continue temazepam 30 mg nightly.  She tends to have a lot of anxious negative thoughts at night when she is trying to go to bed.  She is trying to reduce the dose.  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 She doesn't want to try this now.  Complaining of HA and history migraine.  Asks for increase imitrex and disc preventatives like propranolol ER imitrex to 100 mg prn migraine and propranolol ER for migraine prevention.  Supportive therapy dealing with some of the recent stressors including son's autism and recent issues of rebelliousness with him.    Plan no change indicated:   she agrees.  No obvious changes. Continue Auvelity 1 in AM Continue Wellbutrin XL 150 mg BID Fluvoxamine 100 mg AM and 300 mg PM above usual max bc med necessary Lorazepam 2 mg HS and prn 1 mg BID prn anxiety daily Temazepam 30 mg HS Propranolol ER 60 mg daily Imitrex prn migraine  Follow-up every  week if possible and try to be more consistent. That is her plan.     Meredith Staggers, MD, DFAPA  Please see After Visit Summary for patient specific instructions.  Future Appointments  Date Time Provider Department Center  06/06/2023 11:00 AM Robley Fries, PhD [facility] None  06/20/2023 11:00 AM Robley Fries, PhD [facility] None  07/04/2023 11:00 AM Robley Fries, PhD [facility] None  07/18/2023 11:00 AM Robley Fries, PhD [facility] None  08/01/2023 11:00 AM Robley Fries, PhD [facility] None    No orders of the defined types were placed in this encounter.    -------------------------------

## 2023-05-29 NOTE — Progress Notes (Signed)
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 Pt is being treated for Treatment Resistant Depression, Pt taken to treatment room. Pt's medication is charged through [employer].  Medication is stored behind 2 locked doors, it is never given to the pt until time of administration which is observed by the nurse. Disposed of per FDA/REMS regulations. All Spravato Treatments are documented in Spravato REMS per protocol of being a treatment center.    She was directed to the treatment room to get vitals taken first. Initial vital signs are B/P at 3:10 PM 123/87, 69, Pt instructed to blow her nose and to recline back at 45 degrees. Pt given first nasal spray (28 mg) administered by pt observed by nurse. There were 5 minutes between each dose, total of 84 mg. Tolerated well. Pt's medication is delivered by [facility] in [place] and stored inside a safe behind a locked door as well. Spravato is a CIII medication and has to be only given at a treatment facility and observed by nurse as pt administered intranasally.  Assessed pt's 40 minute vital signs at 4:00 PM 121/80, 90.  Pt met with [name] out of office and they discussed her care at the end of her treatment when her thoughts are clearer and her medication and moods. She does go to the bathroom at least once during her treatment. No sedation and had slight feeling of being "high" she reports. Discharge vitals at 5:00 PM [id number], 72. Pt stable for discharge.  Pt was observed on site a total of 120 minutes per FDA/REMS requirements. Pt was with nurse for clinical assessment 50 minutes. pt will be returning next week and will let me know her schedule.      LOT [id number] APR 2027

## 2023-05-30 ENCOUNTER — Other Ambulatory Visit: Payer: Self-pay

## 2023-05-30 ENCOUNTER — Telehealth: Payer: Self-pay

## 2023-05-30 ENCOUNTER — Ambulatory Visit: Payer: 59

## 2023-05-30 ENCOUNTER — Ambulatory Visit (INDEPENDENT_AMBULATORY_CARE_PROVIDER_SITE_OTHER): Payer: 59 | Admitting: Psychiatry

## 2023-05-30 VITALS — BP 137/96 | HR 64

## 2023-05-30 DIAGNOSIS — Z636 Dependent relative needing care at home: Secondary | ICD-10-CM

## 2023-05-30 DIAGNOSIS — F422 Mixed obsessional thoughts and acts: Secondary | ICD-10-CM

## 2023-05-30 DIAGNOSIS — F339 Major depressive disorder, recurrent, unspecified: Secondary | ICD-10-CM | POA: Diagnosis not present

## 2023-05-30 DIAGNOSIS — F4010 Social phobia, unspecified: Secondary | ICD-10-CM | POA: Diagnosis not present

## 2023-05-30 DIAGNOSIS — G43009 Migraine without aura, not intractable, without status migrainosus: Secondary | ICD-10-CM

## 2023-05-30 DIAGNOSIS — F5105 Insomnia due to other mental disorder: Secondary | ICD-10-CM

## 2023-05-30 MED ORDER — LORAZEPAM 1 MG PO TABS
ORAL_TABLET | ORAL | 2 refills | Status: DC
Start: 2023-05-30 — End: 2023-09-13

## 2023-05-30 NOTE — Progress Notes (Signed)
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 Pt is being treated for Treatment Resistant Depression, Pt taken to treatment room. Pt's medication is charged through [employer].  Medication is stored behind 2 locked doors, it is never given to the pt until time of administration which is observed by the nurse. Disposed of per FDA/REMS regulations. All Spravato Treatments are documented in Spravato REMS per protocol of being a treatment center.    She was directed to the treatment room to get vitals taken first. Initial vital signs are B/P at 3:00 PM 117/83, 70, Pt instructed to blow her nose and to recline back at 45 degrees. Pt given first nasal spray (28 mg) administered by pt observed by nurse. There were 5 minutes between each dose, total of 84 mg. Tolerated well. Pt's medication is delivered by [facility] in [place] and stored inside a safe behind a locked door as well. Spravato is a CIII medication and has to be only given at a treatment facility and observed by nurse as pt administered intranasally.  Assessed pt's 40 minute vital signs at 3:45 PM 118/78, 67.  Pt met with [name] out of office and they discussed her care at the end of her treatment when her thoughts are clearer and her medication and moods. She does go to the bathroom at least once during her treatment. No sedation and had slight feeling of being "high" she reports. Discharge vitals at 5:00 PM 137/96, 64. Pt stable for discharge.  Pt was observed on site a total of 120 minutes per FDA/REMS requirements. Pt was with nurse for clinical assessment 50 minutes. pt will be returning next week on August 20th.     LOT [id number] APR 2027

## 2023-05-30 NOTE — Telephone Encounter (Signed)
Prior Authorization submitted and approved for FLUVOXAMINE 100 MG #120/30 DAY with [facility] effective 04/30/2023-05/29/2024, PA# [id number]

## 2023-06-05 ENCOUNTER — Encounter: Payer: Self-pay | Admitting: Psychiatry

## 2023-06-05 NOTE — Progress Notes (Signed)
DONTASIA PARKE [id number] 1966/09/03 57 y.o.    Subjective:   Patient ID:  Davisha Gallego Stanbrough is a 57 y.o. (DOB 15-Nov-1965) female.  Chief Complaint:  Chief Complaint  Patient presents with   Follow-up   Depression   Anxiety   Stress     HPI Jorryn E Koeppen presents to the office today for follow-up of OCD and severe anxiety.     December 2019 visit the following was noted: No meds were changed. Lives in [name] and back for followup.  Sx are about the same.  Has to take meds with different sizes. Pt reports that mood is Anxious and Depressed and describes anxiety as Severe. Anxiety symptoms include: Excessive Worry, Obsessive Compulsive Symptoms:   Checking,,. Pt reports has interrupted sleep and nocturia. Pt reports that appetite is good. Pt reports that energy is no change and down slightly. Concentration is down slightly. Suicidal thoughts:  denied by patient. Loves the environment of [name] but misses some things there.  She's not able to work there.  H works there and likes it.  Struggled with not working, feels isolated and not up to task of meeting people.  Does attend a church and met a friend who's been helpful.  Leaving for [name] on 10/16/18.   04/09/2020 appointment the following is noted:  Staying another year in [name] bc Covid and other things. Last few months a lot of crying spells.  Is in menopause. Wonders about med changes though is nervous about it.  Crying spells associated with depressing thoughts more than stress or OCD.   Covid really hard on everyone and couldn't see family for 18 mos.  Family still very dysfunctional. No close friends in part due to OCD and depression. Son high Autism spectrum with ADHD and anxiety and she's with him all the time. Greater health problems with CP so more pains.   05/15/20 appt with the following noted: [name] for menopause and helps some. Still depressed.  Chronically. In [place] for 2 more weeks then to [name] for  another year. A lot of stressors lately triggering more checking and anxiety.   OCD is her CC now and seems.  Got worse DT stress.   Stressed with Asberger's son and her health.  H works a lot.  Her FOO still stress. Plan: Trintellix 10 mg 1 tablet in the morning with food and reduce fluvoxamine to 5 tablets nightly for 1 week  then reduce it to 4 tablets nightly.   07/02/20 appt with the following noted: Decided not to get Trintellix bc difficulty getting it. It is available.  There.  Wants to start it now.   Both depression and OCD are severe.  Not suicidal in intent or plan. Did not take samples with her to [name] but will be back in December. covid is worse there and travel is difficult.  Wants to reduce Wellbutrin DT dry mouth. Plan: She's afraid to reduce Luvox at this time DT fear of worsening OCD.  But will consider. Trintellix 10 mg 1 tablet in the morning with food and reduce fluvoxamine to 5 tablets nightly for 1 week  then reduce it to 4 tablets nightly. Also reduce Wellbutrin XL to 300 mg daily.    9-13 2022 appointment with the following noted: Back in [place] since July 14.  Broke arm a month ago and surgery.  It's all been rough adjustment.   B has cancer on his face and M fell taking him to the doctor.  Misses the water and weather of [name].   Cry a lot more since menopause. Still depression and anxiety and OCD.  Asks about ketamine. On Wellbutrin 300, Luvox 300.  No Trintellix. Added Ativan 2 mg AM and HS and it helps.  More likely to get upset at night. Plan: Increase Luvox back to 400 mg daily.  She thinks she's worse on less. Continue Wellbutrin XL to 300 mg daily. 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  She tolerated it well without unusual nausea or vomiting headache or other somatic symptoms.  She did have the expected dissociation which gradually resolved over  the course of the 2-hour period of observation.  She was a little concerned about her balance given her cerebral palsy but has not noted unusual or unexpected problems.  She is motivated to can continue Spravato in hopes of reducing her depressive symptoms. 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  She also has treatment resistant OCD which is partially managed with medications but is still quite disabling.  She is tolerating the medications well.  She is sleeping adequately.  Her appetite is adequate.  She is not having suicidal thoughts.  She continues to wish for a better treatment for OCD that would give her some relief.  09/30/2021 appointment with the following noted: She received her first dose of Spravato 84 mg intranasally today.  She tolerated it well without unusual nausea, vomiting, or other somatic symptoms.  Dissociation as expected did occur and gradually resolved over the 2-hour period of observation.  She did have a mild headache today with the treatment and received ibuprofen 600 mg at her request.  We will follow this to see if it is a pattern Patient is still depressed.  She said she was late with her medicine today and today was a particularly depressing day.  However she notes that the Spravato has lifted her mood considerably even today.  She is hopeful that it will continue to be helpful.  No suicidal thoughts.  She has ongoing chronic anxiety and OCD at baseline.  10/04/21 appt noted: Patient received Spravato 84 mg for the second time today.  She tolerated it well without any unusual headache, nausea or vomiting or other somatic symptoms.  Dissociation did occur and she gradually [place] resolution over the 2-hour period of observation. She did not have any unusual problems after she left the office last Spravato administration.  She did not have any specific problems with balance or walking.  She is at increased risk of that difficulty because of  cerebral palsy.  So far she has not noticed much mood effect from the medication beyond the first day of receiving it.  However she would like to continue Spravato in hopes of getting the antidepressant effect that is desired. Stress dealing with mother's behavior at party pt hosted.  Guilt over it.  10/07/2021 appointment noted: Patient received Spravato 84 mg for the second time today.  She tolerated it well without any unusual headache, nausea or vomiting or other somatic symptoms.  Dissociation did occur and she gradually [place] resolution over the 2-hour period of observation. She still is not sure about the antidepressant effect of Spravato.  Events over the holidays and demands, make it difficult to assess.  She still notes that the OCD tends to worsen the depression and vice versa.  She tolerates the  Spravato well and wants to continue the trial.  10/15/2021 appointment with the following noted: Patient received Spravato 84 mg for the second time today.  She tolerated it well without any unusual headache, nausea or vomiting or other somatic symptoms.  Dissociation did occur and she gradually [place] resolution over the 2-hour period of observation. Patient says it was somewhat difficult to evaluate the effect of the Spravato.  It was scheduled to be twice weekly for 4 weeks consecutively but the holidays have interfered with that administration.  She asked what specifically should be she should be looking for in order to assess improvement.  That was discussed.  The OCD is unchanged and the depression so far is not significantly different.  She still tolerates meds.  There have been no recent med changes  10/19/2021 appt noted: Patient received Spravato 84 mg for the second today.  She tolerated it well without any unusual headache, nausea or vomiting or other somatic symptoms.  Dissociation did occur and she gradually saw resolution over the 2-hour period of observation.   10/21/2021 appointment  noted: Patient received Spravato 84 mg today.  She tolerated it well without any unusual headache, nausea or vomiting or other somatic symptoms.  Dissociation did occur and she gradually saw resolution over the 2-hour period of observation.  She feels better than last week.  She is not as depressed and down.  She is still dealing with grief around the death of her cousin that was unexpected.  It is still difficult to tell how much the Spravato was doing but she is hopeful.  Anxiety is still present with the OCD.  She is not having suicidal thoughts.  She is not hopeless.  She wants to continue treatment.  10/25/2021 appointment with the following noted: Patient received Spravato 84 mg today.  She tolerated it well without any unusual headache, nausea or vomiting or other somatic symptoms.  Dissociation did occur and she gradually saw resolution over the 2-hour period of observation.  She does not typically find the dissociation very strong. She is beginning to think the Spravato is helping somewhat with the depression.  It has been difficult to tell with the holidays intervening as well as the death of her cousin.  She has not been able to get Spravato twice weekly for 4 weeks straight as typically planned.  However she is hopeful.  The OCD remains significant.  She still has a tendency to think very negatively.  She is not suicidal.  10/28/2021 appointment with the following noted: Patient received Spravato 84 mg today.  She tolerated it well without any unusual headache, nausea or vomiting or other somatic symptoms.  Dissociation did occur and she gradually saw resolution over the 2-hour period of observation.  She does not typically find the dissociation very strong. She is feeling more hopeful about the administration of Spravato.  She is having less depression she believes.  Still not dramatically different.  She still has a tendency to have a lot of anxiety and rumination and OCD.  She is not suicidal.   She is eager to continue the Spravato.  11/01/2021 appointment with the following noted: Patient received Spravato 84 mg today.  She tolerated it well without any unusual headache, nausea or vomiting or other somatic symptoms.  Dissociation did occur and she gradually saw resolution over the 2-hour period of observation.  She does not typically find the dissociation very strong. She is continuing to see a little bit of improvement in depression  with Spravato.  The anxiety remains but may be not as severe.  The OCD remains markedly severe chronically.  She is not suicidal.  She is encouraged by the degree of improvement with Spravato and inability to enjoy things more and not be quite as ruminative.  11/04/2021 appt noted: Patient received Spravato 84 mg today.  She tolerated it well without any unusual headache, nausea or vomiting or other somatic symptoms.  Dissociation did occur and she gradually saw resolution over the 2-hour period of observation.  She does not typically find the dissociation very strong. No SE complaints with meds. She continues to feel hopeful about the Spravato.  She has less depression.  Because of a number of factors she is uncertain of the full benefit but thinks she is somewhat less depressed.  Her anxiety and OCD remain significant but a little better.  She is tolerating the medications and does not desire medicine change.  She is not currently complaining of insomnia.   11/08/2021 appointment the following noted: Patient received Spravato 84 mg today.  She tolerated it well without any unusual headache, nausea or vomiting or other somatic symptoms.  Dissociation did occur and she gradually saw resolution over the 2-hour period of observation.  She does not typically find the dissociation very strong. No SE complaints with meds. She feels the Spravato is helping somewhat.  She would like to see a greater effect.  However she is able to enjoy things.  She is productive at home.   She would like to see a lifting of a degree of sadness that remains.  The anxiety and OCD remained largely unchanged.  She wondered about the dosing of Wellbutrin 300 mg a day and Luvox 300 mg a day and possible increases.  She has been at higher doses in the past.  She plans to start water therapy for her weakness and for her shoulder.  11/11/2021 appointment with the following noted: Patient received Spravato 84 mg today.  She tolerated it well without any unusual headache, nausea or vomiting or other somatic symptoms.  Dissociation did occur and she gradually saw resolution over the 2-hour period of observation.  She does not typically find the dissociation very strong. No SE complaints with meds. She feels the Spravato is clearly helping the depression.  She would like to see a more significant effect.  She is still having trouble thinking positive. Her energy is fair.  Concentration is good except for the problem with chronic obsessions. She has been taking Wellbutrin 300 mg in Luvox 300 mg for quite some time but has taken higher doses in the past.  We discussed that.  She would like to try higher doses in order to get a better effect if possible. We just increased the doses a couple of days ago.  No effect yet.  11/15/2021 appointment with the following noted: Patient received Spravato 84 mg today.  She tolerated it well without any unusual headache, nausea or vomiting or other somatic symptoms.  Dissociation did occur and she gradually saw resolution over the 2-hour period of observation.  She does not typically find the dissociation very strong. No SE complaints with meds. The patient is now convinced that the Spravato is helping the depression.  She would like to continue twice weekly Spravato this week if possible.  She has tolerated the increase in Wellbutrin to 450 mg daily and the increase and fluvoxamine to 400 mg daily without complications thus far.  The OCD and anxiety feed  the  depression to some extent. She spends approximately 2 hours daily with checking compulsions due to obsessions about causing harm to others.  For example fearing that when she has hit a pot hole that she may have hit a person and going back to check.  Checking corners and rooms out of fear that she may have harmed someone.  Other various checking compulsions.  She is hoping the increase in fluvoxamine to 400 mg will reduce that over the weeks to come.  She is not seeing a significant difference with the addition of the Spravato though she understands that was not expected.  She is more productive at home and more motivated and able to enjoy things more fully as a result of the Spravato treatment.  She is tolerating the medication  11/18/2021 appointment with the following noted: Patient received Spravato 84 mg today.  She tolerated it well without any unusual headache, nausea or vomiting or other somatic symptoms.  Dissociation did occur and she gradually saw resolution over the 2-hour period of observation.  She does not typically find the dissociation very strong. No SE complaints with meds. She clearly believes the Spravato has been helpful for the depression.  She wonders whether to continue to treatments weekly or to cut back to 1 weekly.  She would like to continue twice weekly in hopes of getting additional improvement in the depression because it is not resolved but it is difficult to get here twice a week in terms of arranging rides. She is recently increased Wellbutrin XL to 450 mg daily and fluvoxamine to 400 mg daily but they have not had time to have an official effect.  She is tolerating that well.  She is tolerating meds overwork overall well. The OCD remains the same as noted on 11/15/2021  11/25/21 appt noted: Patient received Spravato 84 mg today.  She tolerated it well without any unusual headache, nausea or vomiting or other somatic symptoms.  Dissociation did occur and she gradually saw  resolution over the 2-hour period of observation.  She does not typically find the dissociation very strong. No SE complaints with meds. She thinks the increase in Wellbutrin and Luvox have been potentially helpful for depression and OCD respectively.  It has been too early to see the full effect.  She is sleeping and eating well.  She is functioning at home.  She still spends a lot of time that is about 2 hours a day dealing with compulsive behaviors.  12/02/21 appt noted: Patient received Spravato 84 mg today.  She tolerated it well without any unusual headache, nausea or vomiting or other somatic symptoms.  Dissociation did occur and she gradually saw resolution over the 2-hour period of observation.  She does not typically find the dissociation very strong. No SE complaints with meds. Several losses and stressors recently that affect her sense of mood. However still sees significant benefit from the Spravato for her depression.  Wants to continue it. Suspect early  some benefit from the increased Wellbutrin for depression and Luvox for OCD. Tolerating meds. No complaints about the meds. Sleeping and eating well.  No new health concerns.  12/09/21 appt noted: Patient received Spravato 84 mg today.  She tolerated it well without any unusual headache, nausea or vomiting or other somatic symptoms.  Dissociation did occur and she gradually saw resolution over the 2-hour period of observation.  She does not typically find the dissociation very strong. No SE complaints with meds. Seeing noticeable improvement from  increase fluvoxamine to 400 mg daily.  Tolerating meds without concerns over them. Depression is stable with residual sx of easy guilt and easily stressed.  OCD contributes to depression but depression is not severe with less crying spells.  Productive at home with chores.  Enjoyed recent birthday.  Sleeping good. No new concerns.  12/23/2021 appointment noted: Patient received Spravato 84 mg  today.  She tolerated it well without any unusual headache, nausea or vomiting or other somatic symptoms.  Dissociation did occur and she gradually saw resolution over the 2-hour period of observation.  She does not typically find the dissociation very strong. No SE complaints with meds. Seeing noticeable improvement from increase fluvoxamine to 400 mg daily.  Tolerating meds without concerns over them. Her depression is somewhat improved with the Spravato.  She also feels generally a little lighter.  She is more motivated.  She is less overwhelmed by guilt.  The OCD is gradually improving but is still quite time-consuming as noted before.  She is sleeping well.  No side effects  12/30/2021 appointment with the following noted: Patient received Spravato 84 mg today.  She tolerated it well without any unusual headache, nausea or vomiting or other somatic symptoms.  Dissociation did occur and she gradually saw resolution over the 2-hour period of observation.  She does not typically find the dissociation very strong. No SE complaints with meds. Seeing noticeable improvement from increase fluvoxamine to 400 mg daily.  Tolerating meds without concerns over them. She is confident of her the improvement seen with Spravato.  She is less hopeless.  Guilt is marked remarkably improved.  She is not having any thoughts of death or dying.  She is more motivated for activities such as exercise which she is recently started.  She is sleeping well. The OCD remains severe but it is improving somewhat with the increase in fluvoxamine.  It is still consuming a couple hours per day.  01/10/22 apravato 84 admin  01/24/22 appt noted: Patient received Spravato 84 mg today.  She tolerated it well without any unusual headache, nausea or vomiting or other somatic symptoms.  Dissociation did occur and she gradually saw resolution over the 2-hour period of observation.  She does not typically find the dissociation very strong. No  SE complaints with meds. Very tearful today.  Feels like she has been suppressing emotion in the Spravato caused it to be released.  Discussed some stressors.  Overall still feels the medicine is helpful.  She has missed some of the scheduled Spravato treatments that were intended to be weekly due to circumstances beyond her control.  She is still struggling with OCD as previously noted but does believe the medications are helpful. Plan no med changes  01/31/2022 received Spravato 84 mg today  02/09/2022 appointment with the following noted: Patient received Spravato 84 mg today.  She tolerated it well without any unusual headache, nausea or vomiting or other somatic symptoms.  Dissociation did occur and she gradually saw resolution over the 2-hour period of observation.  She does not typically find the dissociation very strong. No SE complaints with meds. Spravato clearly helps depression and OCD but easily gets overwhelmed and tearful with fairly routine stressors.  Tolerating meds. Sleep and appetite is OK Asks to increase lorazepam to 2 mg AM and HS and 1mg  afternoon  02/16/22 appt noted: Patient received Spravato 84 mg today.  She tolerated it well without any unusual headache, nausea or vomiting or other somatic symptoms.  Dissociation did  occur and she gradually saw resolution over the 2-hour period of observation.  She does not typically find the dissociation very strong. No SE complaints with meds. She has chronic depesssion and OCD but is improved with Spravato, both dx versus before.  She has continued Luvox 400 mg and Wellbutrin 450 mg and is tolerating it.  Chronically easily stressed.  Tolerating all meds.  Doesn't like taking more meds.  Spending a couple hours daily with OCD.  No SI No med changes.  02/21/22 appt noted:   Doesn't like taking more meds.  Spending a couple hours daily with OCD.  No SI No med changes.  02/21/22 appt noted: Patient received Spravato 84 mg today.  She  tolerated it well without any unusual headache, nausea or vomiting or other somatic symptoms.  Dissociation did occur and she gradually saw resolution over the 2-hour period of observation.  She does not typically find the dissociation very strong. No SE complaints with meds. She has chronic depesssion and OCD but is improved with Spravato, both dx versus before.  She has continued Luvox 400 mg and Wellbutrin 450 mg and is tolerating it.  Chronically easily overwhelmed and doesn't know why.  Tolerating all meds. Wants to continue meds.  03/16/22 appt noted: Patient received Spravato 84 mg today.  She tolerated it well without any unusual headache, nausea or vomiting or other somatic symptoms.  Dissociation did occur and she gradually saw resolution over the 2-hour period of observation.  She does not typically find the dissociation very strong. No SE complaints with meds. Overall she still feels the Spravato has been helpful not only for her depression but also for her OCD which was somewhat unexpected.  OCD is still significant but it is less severe than prior to starting Spravato.  She is tolerating Luvox 400 mg and Wellbutrin 450 mg.  We discussed possible med adjustments.  03/23/22 appt noted: Patient received Spravato 84 mg today.  She tolerated it well without any unusual headache, nausea or vomiting or other somatic symptoms.  Dissociation did occur and she gradually saw resolution over the 2-hour period of observation.  She does not typically find the dissociation very strong. No SE complaints with meds. She is still depressed and still has OCD of course but is improved with the Spravato.  She is tolerating the medications well.  We had previously discussed the possibility of switching some of the Wellbutrin to [facility] and she is very interested in that in hopes of further improvement in depression and OCD.  She understands that Auvelity is not used for OCD on the label.  She is tolerating the  medications.  She is still easily overwhelmed.  She is sleeping and eating okay.. Plan: Reduce Wellbutrin XL to 300 mg AM and add Auvelity 1 tablet each AM  03/30/22 appt noted: Patient received Spravato 84 mg today.  She tolerated it well without any unusual headache, nausea or vomiting or other somatic symptoms.  Dissociation did occur and she gradually saw resolution over the 2-hour period of observation.  She does not typically find the dissociation very strong. No SE complaints with meds. She is still depressed and still has OCD of course but is improved with the Spravato.  She is tolerating the medications well.  No difference with Auvelity 1 AM so far and no SE.  Going on vacation on Saturday. 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 less anxious Checking compulsions up to 2 hours daily but improved noticeably Chronic depression persistent but better with Spravato  and helps OCD too.        Lab Review:     Component Value Date/Time   NA 138 06/11/2021 0606   K 4.0  06/11/2021 0606   CL 107 06/11/2021 0606   CO2 26 06/11/2021 0606   GLUCOSE 90 06/11/2021 0606   BUN 18 06/11/2021 0606   CREATININE 0.81 06/11/2021 0606   CALCIUM 9.4 06/11/2021 0606   PROT 6.5 06/11/2021 0606   ALBUMIN 3.3 (L) 06/11/2021 0606   AST 17 06/11/2021 0606   ALT 14 06/11/2021 0606   ALKPHOS 141 (H) 06/11/2021 0606   BILITOT 0.2 (L) 06/11/2021 0606   GFRNONAA >60 06/11/2021 0606   GFRAA >60 07/09/2016 0438       Component Value Date/Time   WBC 5.8 06/11/2021 0606   RBC 4.12 06/11/2021 0606   HGB 12.5 06/11/2021 0606   HCT 39.7 06/11/2021 0606   PLT 299 06/11/2021 0606   MCV 96.4 06/11/2021 0606   MCH 30.3 06/11/2021 0606   MCHC 31.5 06/11/2021 0606   RDW 13.9 06/11/2021 0606   LYMPHSABS 1.9 06/11/2021 0606   MONOABS 0.5 06/11/2021 0606   EOSABS 0.1 06/11/2021 0606   BASOSABS 0.0 06/11/2021 0606    No results found for: "POCLITH", "LITHIUM"   No results found for: "PHENYTOIN", "PHENOBARB", "VALPROATE", "CBMZ"   .res Assessment: Plan:    Mariaisabel "[name]" was seen today for follow-up, depression, anxiety and stress.  Diagnoses and all orders for this visit:  Recurrent major depression resistant to treatment [facility])  Mixed obsessional thoughts and acts  Social anxiety disorder  Migraine without aura and without status migrainosus, not intractable  Insomnia due to mental condition  Caregiver stress    Both primary Dx of OCD and major depression are TR and marked.  Impaired function but less so with Spravato re: depression..   She is receiving Spravato 84 mg weekly and marked improvement in the depression..  she feels it also helps OCD somewhat.  However still easily overwhelmed with low stress tolerance.  The OCD is improved with the increase in fluvoxamine and with Spravato.  Spends  2 hours daily and checking compulsions on her worst days but better when she travels.  No new options for tx are evident.  She has been on higher doses of fluvoxamine above  the usual max of 400 mg daily in the past.    Disc SE.   She is tolerating the meds well  Continue  Luvox back to 400 mg nightly as of January 2023. Disc dosing higher than usual.  She feels this is increase has helped more with OCD which remains chronically severe.  Contiinue Auvelity 1 AM and  Wants to continue Wellbutrin XL 150 BID.  Option increase Auvelity BID and reduce Wellbutrin to 1 AM. But she didn't think it made a lot more difference than 1 Auveltiy daily when taken in summer 2023. There are few other alternative medication options that remain.   Disc DDI issues.    Disc Spravato DT TRD incl details and SE. Disc dosing and duration.  Pt with severe depression MADRS 36 on 06/29/21  Patient was administered Spravato 84 mg intranasally dosage today.  The patient experienced the typical dissociation which gradually resolved over the 2-hour period of observation.  There were no complications.  Specifically the patient did not have nausea or vomiting or headache.  Blood pressures remained within normal ranges at the 40-minute and 2-hour follow-up intervals.  By the time the 2-hour observation period was met the patient was alert and oriented and able to exit without assistance. She tends to have lingering sedative effects but not severe. .  See nursing note for further details.  Didn't feel as well when off spravato but disc family issurs interfering with frequency.  We discussed the short-term risks associated with benzodiazepines including sedation and increased fall risk among others.  Discussed long-term side effect risk including dependence, potential withdrawal symptoms, and the potential eventual dose-related risk of dementia.  But recent studies from 2020 dispute this association between benzodiazepines and dementia risk. Newer studies in 2020 do not support an association with dementia. Disc this is high dose and not ideal.  Also disc risk combining it with temazepam. Rec try  gradually reduce HS lorazepam to 1 mg Hs.  Can continue lorazepam 2 mg AM and 1 mg in afternoon bc of chronic anxiety and it is helpful and tolerated. She can continue temazepam 30 mg nightly.  She tends to have a lot of anxious negative thoughts at night when she is trying to go to bed.  She is trying to reduce the dose.  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 She doesn't want to try this now.  Complaining of HA and history migraine.  Asks for increase imitrex and disc preventatives like propranolol ER imitrex to 100 mg prn migraine and propranolol ER for migraine prevention.  Supportive therapy dealing with some of the recent stressors including son's autism and recent issues of rebelliousness with him.    Plan no change indicated:   she agrees.  No obvious changes. Continue Auvelity 1 in AM Continue Wellbutrin XL 150 mg BID Fluvoxamine 100 mg AM and 300 mg PM above usual max bc med necessary Lorazepam 2 mg HS and prn 1 mg BID prn anxiety daily Temazepam 30 mg HS Propranolol ER 60 mg daily Imitrex prn migraine  Follow-up every week if possible and try to be consistent. That is her plan but family demands do interfere at times.     Meredith Staggers, MD, DFAPA  Please see After Visit Summary  for patient specific instructions.  Future Appointments  Date Time Provider Department Center  06/06/2023 11:00 AM Robley Fries, PhD [facility] None  06/06/2023  3:00 PM Cottle, Steva Ready., MD [facility] None  06/06/2023  3:00 PM CP-NURSE [facility] None  06/20/2023 11:00 AM Robley Fries, PhD [facility] None  07/04/2023 11:00 AM Robley Fries, PhD [facility] None  07/18/2023 11:00 AM Robley Fries, PhD [facility] None  08/01/2023 11:00 AM Robley Fries, PhD [facility] None    No orders of the defined types were placed in this encounter.    -------------------------------

## 2023-06-06 ENCOUNTER — Ambulatory Visit (INDEPENDENT_AMBULATORY_CARE_PROVIDER_SITE_OTHER): Payer: 59 | Admitting: Psychiatry

## 2023-06-06 ENCOUNTER — Ambulatory Visit: Payer: 59 | Admitting: Psychiatry

## 2023-06-06 ENCOUNTER — Ambulatory Visit: Payer: 59

## 2023-06-06 ENCOUNTER — Encounter: Payer: Self-pay | Admitting: Psychiatry

## 2023-06-06 VITALS — BP 126/85 | HR 76

## 2023-06-06 DIAGNOSIS — F339 Major depressive disorder, recurrent, unspecified: Secondary | ICD-10-CM

## 2023-06-06 DIAGNOSIS — F422 Mixed obsessional thoughts and acts: Secondary | ICD-10-CM | POA: Diagnosis not present

## 2023-06-06 DIAGNOSIS — Z638 Other specified problems related to primary support group: Secondary | ICD-10-CM

## 2023-06-06 DIAGNOSIS — Z636 Dependent relative needing care at home: Secondary | ICD-10-CM | POA: Diagnosis not present

## 2023-06-06 DIAGNOSIS — F5105 Insomnia due to other mental disorder: Secondary | ICD-10-CM

## 2023-06-06 DIAGNOSIS — G43009 Migraine without aura, not intractable, without status migrainosus: Secondary | ICD-10-CM

## 2023-06-06 DIAGNOSIS — F4010 Social phobia, unspecified: Secondary | ICD-10-CM

## 2023-06-06 NOTE — Progress Notes (Unsigned)
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 Pt is being treated for Treatment Resistant Depression, Pt taken to treatment room. Pt's medication is charged through [employer].  Medication is stored behind 2 locked doors, it is never given to the pt until time of administration which is observed by the nurse. Disposed of per FDA/REMS regulations. All Spravato Treatments are documented in Spravato REMS per protocol of being a treatment center. Spravato is a CIII medication and has to be only given at a treatment facility and observed by nurse as pt administered intranasally   She was directed to the treatment room to get vitals taken first. Initial vital signs are B/P at 3:12 PM 118/88, 86, Pt instructed to blow her nose and to recline back at 45 degrees. Pt given first nasal spray (28 mg) administered by pt observed by nurse. There were 5 minutes between each dose, total of 84 mg. Tolerated well. Assessed pt's 40 minute vital signs at 4:30 PM 118/90, 77.  Pt met with [name] out of office and they discussed her care at the end of her treatment when her thoughts are clearer and her medication and moods. She does go to the bathroom at least once during her treatment. No sedation and had slight feeling of being "high" she reports. Discharge vitals at 5:25 PM 126/85, 76. Pt stable for discharge.  Pt was observed on site a total of 120 minutes per FDA/REMS requirements. Pt was with nurse for clinical assessment 50 minutes. pt will contact nurse when able to return to treatment. Pt's son is starting back to school so once she gets his schedule down she will be able to set up another apts.   LOT [id number] OCT 2026

## 2023-06-06 NOTE — Progress Notes (Signed)
Casey Diaz [id number] 08-20-1966 57 y.o.    Subjective:   Patient ID:  Casey Diaz is a 57 y.o. (DOB 1966/08/19) female.  Chief Complaint:  Chief Complaint  Patient presents with   Follow-up   Depression   Anxiety   Sleeping Problem     HPI Casey Diaz presents to the office today for follow-up of OCD and severe anxiety.     December 2019 visit the following was noted: No meds were changed. Lives in [name] and back for followup.  Sx are about the same.  Has to take meds with different sizes. Pt reports that mood is Anxious and Depressed and describes anxiety as Severe. Anxiety symptoms include: Excessive Worry, Obsessive Compulsive Symptoms:   Checking,,. Pt reports has interrupted sleep and nocturia. Pt reports that appetite is good. Pt reports that energy is no change and down slightly. Concentration is down slightly. Suicidal thoughts:  denied by patient. Loves the environment of [name] but misses some things there.  She's not able to work there.  H works there and likes it.  Struggled with not working, feels isolated and not up to task of meeting people.  Does attend a church and met a friend who's been helpful.  Leaving for [name] on 10/16/18.   04/09/2020 appointment the following is noted:  Staying another year in [name] bc Covid and other things. Last few months a lot of crying spells.  Is in menopause. Wonders about med changes though is nervous about it.  Crying spells associated with depressing thoughts more than stress or OCD.   Covid really hard on everyone and couldn't see family for 18 mos.  Family still very dysfunctional. No close friends in part due to OCD and depression. Son high Autism spectrum with ADHD and anxiety and she's with him all the time. Greater health problems with CP so more pains.   05/15/20 appt with the following noted: Casey Diaz for menopause and helps some. Still depressed.  Chronically. In [place] for 2 more weeks then to  [name] for another year. A lot of stressors lately triggering more checking and anxiety.   OCD is her CC now and seems.  Got worse DT stress.   Stressed with Asberger's son and her health.  H works a lot.  Her FOO still stress. Plan: Trintellix 10 mg 1 tablet in the morning with food and reduce fluvoxamine to 5 tablets nightly for 1 week  then reduce it to 4 tablets nightly.   07/02/20 appt with the following noted: Decided not to get Trintellix bc difficulty getting it. It is available.  There.  Wants to start it now.   Both depression and OCD are severe.  Not suicidal in intent or plan. Did not take samples with her to [name] but will be back in December. covid is worse there and travel is difficult.  Wants to reduce Wellbutrin DT dry mouth. Plan: She's afraid to reduce Luvox at this time DT fear of worsening OCD.  But will consider. Trintellix 10 mg 1 tablet in the morning with food and reduce fluvoxamine to 5 tablets nightly for 1 week  then reduce it to 4 tablets nightly. Also reduce Wellbutrin XL to 300 mg daily.    9-13 2022 appointment with the following noted: Back in [place] since July 14.  Broke arm a month ago and surgery.  It's all been rough adjustment.   B has cancer on his face and M fell taking him to the doctor.  Misses the water and weather of [name].   Cry a lot more since menopause. Still depression and anxiety and OCD.  Asks about ketamine. On Wellbutrin 300, Luvox 300.  No Trintellix. Added Ativan 2 mg AM and HS and it helps.  More likely to get upset at night. Plan: Increase Luvox back to 400 mg daily.  She thinks she's worse on less. Continue Wellbutrin XL to 300 mg daily. 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  She tolerated it well without unusual nausea or vomiting headache or other somatic symptoms.  She did have the expected dissociation which gradually  resolved over the course of the 2-hour period of observation.  She was a little concerned about her balance given her cerebral palsy but has not noted unusual or unexpected problems.  She is motivated to can continue Spravato in hopes of reducing her depressive symptoms. 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  She also has treatment resistant OCD which is partially managed with medications but is still quite disabling.  She is tolerating the medications well.  She is sleeping adequately.  Her appetite is adequate.  She is not having suicidal thoughts.  She continues to wish for a better treatment for OCD that would give her some relief.  09/30/2021 appointment with the following noted: She received her first dose of Spravato 84 mg intranasally today.  She tolerated it well without unusual nausea, vomiting, or other somatic symptoms.  Dissociation as expected did occur and gradually resolved over the 2-hour period of observation.  She did have a mild headache today with the treatment and received ibuprofen 600 mg at her request.  We will follow this to see if it is a pattern Patient is still depressed.  She said she was late with her medicine today and today was a particularly depressing day.  However she notes that the Spravato has lifted her mood considerably even today.  She is hopeful that it will continue to be helpful.  No suicidal thoughts.  She has ongoing chronic anxiety and OCD at baseline.  10/04/21 appt noted: Patient received Spravato 84 mg for the second time today.  She tolerated it well without any unusual headache, nausea or vomiting or other somatic symptoms.  Dissociation did occur and she gradually [place] resolution over the 2-hour period of observation. She did not have any unusual problems after she left the office last Spravato administration.  She did not have any specific problems with balance or walking.  She is at increased risk of that  difficulty because of cerebral palsy.  So far she has not noticed much mood effect from the medication beyond the first day of receiving it.  However she would like to continue Spravato in hopes of getting the antidepressant effect that is desired. Stress dealing with mother's behavior at party pt hosted.  Guilt over it.  10/07/2021 appointment noted: Patient received Spravato 84 mg for the second time today.  She tolerated it well without any unusual headache, nausea or vomiting or other somatic symptoms.  Dissociation did occur and she gradually [place] resolution over the 2-hour period of observation. She still is not sure about the antidepressant effect of Spravato.  Events over the holidays and demands, make it difficult to assess.  She still notes that the OCD tends to worsen the depression and vice versa.  She tolerates the  Spravato well and wants to continue the trial.  10/15/2021 appointment with the following noted: Patient received Spravato 84 mg for the second time today.  She tolerated it well without any unusual headache, nausea or vomiting or other somatic symptoms.  Dissociation did occur and she gradually [place] resolution over the 2-hour period of observation. Patient says it was somewhat difficult to evaluate the effect of the Spravato.  It was scheduled to be twice weekly for 4 weeks consecutively but the holidays have interfered with that administration.  She asked what specifically should be she should be looking for in order to assess improvement.  That was discussed.  The OCD is unchanged and the depression so far is not significantly different.  She still tolerates meds.  There have been no recent med changes  10/19/2021 appt noted: Patient received Spravato 84 mg for the second today.  She tolerated it well without any unusual headache, nausea or vomiting or other somatic symptoms.  Dissociation did occur and she gradually saw resolution over the 2-hour period of observation.    10/21/2021 appointment noted: Patient received Spravato 84 mg today.  She tolerated it well without any unusual headache, nausea or vomiting or other somatic symptoms.  Dissociation did occur and she gradually saw resolution over the 2-hour period of observation.  She feels better than last week.  She is not as depressed and down.  She is still dealing with grief around the death of her cousin that was unexpected.  It is still difficult to tell how much the Spravato was doing but she is hopeful.  Anxiety is still present with the OCD.  She is not having suicidal thoughts.  She is not hopeless.  She wants to continue treatment.  10/25/2021 appointment with the following noted: Patient received Spravato 84 mg today.  She tolerated it well without any unusual headache, nausea or vomiting or other somatic symptoms.  Dissociation did occur and she gradually saw resolution over the 2-hour period of observation.  She does not typically find the dissociation very strong. She is beginning to think the Spravato is helping somewhat with the depression.  It has been difficult to tell with the holidays intervening as well as the death of her cousin.  She has not been able to get Spravato twice weekly for 4 weeks straight as typically planned.  However she is hopeful.  The OCD remains significant.  She still has a tendency to think very negatively.  She is not suicidal.  10/28/2021 appointment with the following noted: Patient received Spravato 84 mg today.  She tolerated it well without any unusual headache, nausea or vomiting or other somatic symptoms.  Dissociation did occur and she gradually saw resolution over the 2-hour period of observation.  She does not typically find the dissociation very strong. She is feeling more hopeful about the administration of Spravato.  She is having less depression she believes.  Still not dramatically different.  She still has a tendency to have a lot of anxiety and rumination and  OCD.  She is not suicidal.  She is eager to continue the Spravato.  11/01/2021 appointment with the following noted: Patient received Spravato 84 mg today.  She tolerated it well without any unusual headache, nausea or vomiting or other somatic symptoms.  Dissociation did occur and she gradually saw resolution over the 2-hour period of observation.  She does not typically find the dissociation very strong. She is continuing to see a little bit of improvement in depression  with Spravato.  The anxiety remains but may be not as severe.  The OCD remains markedly severe chronically.  She is not suicidal.  She is encouraged by the degree of improvement with Spravato and inability to enjoy things more and not be quite as ruminative.  11/04/2021 appt noted: Patient received Spravato 84 mg today.  She tolerated it well without any unusual headache, nausea or vomiting or other somatic symptoms.  Dissociation did occur and she gradually saw resolution over the 2-hour period of observation.  She does not typically find the dissociation very strong. No SE complaints with meds. She continues to feel hopeful about the Spravato.  She has less depression.  Because of a number of factors she is uncertain of the full benefit but thinks she is somewhat less depressed.  Her anxiety and OCD remain significant but a little better.  She is tolerating the medications and does not desire medicine change.  She is not currently complaining of insomnia.   11/08/2021 appointment the following noted: Patient received Spravato 84 mg today.  She tolerated it well without any unusual headache, nausea or vomiting or other somatic symptoms.  Dissociation did occur and she gradually saw resolution over the 2-hour period of observation.  She does not typically find the dissociation very strong. No SE complaints with meds. She feels the Spravato is helping somewhat.  She would like to see a greater effect.  However she is able to enjoy things.   She is productive at home.  She would like to see a lifting of a degree of sadness that remains.  The anxiety and OCD remained largely unchanged.  She wondered about the dosing of Wellbutrin 300 mg a day and Luvox 300 mg a day and possible increases.  She has been at higher doses in the past.  She plans to start water therapy for her weakness and for her shoulder.  11/11/2021 appointment with the following noted: Patient received Spravato 84 mg today.  She tolerated it well without any unusual headache, nausea or vomiting or other somatic symptoms.  Dissociation did occur and she gradually saw resolution over the 2-hour period of observation.  She does not typically find the dissociation very strong. No SE complaints with meds. She feels the Spravato is clearly helping the depression.  She would like to see a more significant effect.  She is still having trouble thinking positive. Her energy is fair.  Concentration is good except for the problem with chronic obsessions. She has been taking Wellbutrin 300 mg in Luvox 300 mg for quite some time but has taken higher doses in the past.  We discussed that.  She would like to try higher doses in order to get a better effect if possible. We just increased the doses a couple of days ago.  No effect yet.  11/15/2021 appointment with the following noted: Patient received Spravato 84 mg today.  She tolerated it well without any unusual headache, nausea or vomiting or other somatic symptoms.  Dissociation did occur and she gradually saw resolution over the 2-hour period of observation.  She does not typically find the dissociation very strong. No SE complaints with meds. The patient is now convinced that the Spravato is helping the depression.  She would like to continue twice weekly Spravato this week if possible.  She has tolerated the increase in Wellbutrin to 450 mg daily and the increase and fluvoxamine to 400 mg daily without complications thus far.  The OCD  and anxiety  feed the depression to some extent. She spends approximately 2 hours daily with checking compulsions due to obsessions about causing harm to others.  For example fearing that when she has hit a pot hole that she may have hit a person and going back to check.  Checking corners and rooms out of fear that she may have harmed someone.  Other various checking compulsions.  She is hoping the increase in fluvoxamine to 400 mg will reduce that over the weeks to come.  She is not seeing a significant difference with the addition of the Spravato though she understands that was not expected.  She is more productive at home and more motivated and able to enjoy things more fully as a result of the Spravato treatment.  She is tolerating the medication  11/18/2021 appointment with the following noted: Patient received Spravato 84 mg today.  She tolerated it well without any unusual headache, nausea or vomiting or other somatic symptoms.  Dissociation did occur and she gradually saw resolution over the 2-hour period of observation.  She does not typically find the dissociation very strong. No SE complaints with meds. She clearly believes the Spravato has been helpful for the depression.  She wonders whether to continue to treatments weekly or to cut back to 1 weekly.  She would like to continue twice weekly in hopes of getting additional improvement in the depression because it is not resolved but it is difficult to get here twice a week in terms of arranging rides. She is recently increased Wellbutrin XL to 450 mg daily and fluvoxamine to 400 mg daily but they have not had time to have an official effect.  She is tolerating that well.  She is tolerating meds overwork overall well. The OCD remains the same as noted on 11/15/2021  11/25/21 appt noted: Patient received Spravato 84 mg today.  She tolerated it well without any unusual headache, nausea or vomiting or other somatic symptoms.  Dissociation did occur and  she gradually saw resolution over the 2-hour period of observation.  She does not typically find the dissociation very strong. No SE complaints with meds. She thinks the increase in Wellbutrin and Luvox have been potentially helpful for depression and OCD respectively.  It has been too early to see the full effect.  She is sleeping and eating well.  She is functioning at home.  She still spends a lot of time that is about 2 hours a day dealing with compulsive behaviors.  12/02/21 appt noted: Patient received Spravato 84 mg today.  She tolerated it well without any unusual headache, nausea or vomiting or other somatic symptoms.  Dissociation did occur and she gradually saw resolution over the 2-hour period of observation.  She does not typically find the dissociation very strong. No SE complaints with meds. Several losses and stressors recently that affect her sense of mood. However still sees significant benefit from the Spravato for her depression.  Wants to continue it. Suspect early  some benefit from the increased Wellbutrin for depression and Luvox for OCD. Tolerating meds. No complaints about the meds. Sleeping and eating well.  No new health concerns.  12/09/21 appt noted: Patient received Spravato 84 mg today.  She tolerated it well without any unusual headache, nausea or vomiting or other somatic symptoms.  Dissociation did occur and she gradually saw resolution over the 2-hour period of observation.  She does not typically find the dissociation very strong. No SE complaints with meds. Seeing noticeable improvement from  increase fluvoxamine to 400 mg daily.  Tolerating meds without concerns over them. Depression is stable with residual sx of easy guilt and easily stressed.  OCD contributes to depression but depression is not severe with less crying spells.  Productive at home with chores.  Enjoyed recent birthday.  Sleeping good. No new concerns.  12/23/2021 appointment noted: Patient  received Spravato 84 mg today.  She tolerated it well without any unusual headache, nausea or vomiting or other somatic symptoms.  Dissociation did occur and she gradually saw resolution over the 2-hour period of observation.  She does not typically find the dissociation very strong. No SE complaints with meds. Seeing noticeable improvement from increase fluvoxamine to 400 mg daily.  Tolerating meds without concerns over them. Her depression is somewhat improved with the Spravato.  She also feels generally a little lighter.  She is more motivated.  She is less overwhelmed by guilt.  The OCD is gradually improving but is still quite time-consuming as noted before.  She is sleeping well.  No side effects  12/30/2021 appointment with the following noted: Patient received Spravato 84 mg today.  She tolerated it well without any unusual headache, nausea or vomiting or other somatic symptoms.  Dissociation did occur and she gradually saw resolution over the 2-hour period of observation.  She does not typically find the dissociation very strong. No SE complaints with meds. Seeing noticeable improvement from increase fluvoxamine to 400 mg daily.  Tolerating meds without concerns over them. She is confident of her the improvement seen with Spravato.  She is less hopeless.  Guilt is marked remarkably improved.  She is not having any thoughts of death or dying.  She is more motivated for activities such as exercise which she is recently started.  She is sleeping well. The OCD remains severe but it is improving somewhat with the increase in fluvoxamine.  It is still consuming a couple hours per day.  01/10/22 apravato 84 admin  01/24/22 appt noted: Patient received Spravato 84 mg today.  She tolerated it well without any unusual headache, nausea or vomiting or other somatic symptoms.  Dissociation did occur and she gradually saw resolution over the 2-hour period of observation.  She does not typically find the  dissociation very strong. No SE complaints with meds. Very tearful today.  Feels like she has been suppressing emotion in the Spravato caused it to be released.  Discussed some stressors.  Overall still feels the medicine is helpful.  She has missed some of the scheduled Spravato treatments that were intended to be weekly due to circumstances beyond her control.  She is still struggling with OCD as previously noted but does believe the medications are helpful. Plan no med changes  01/31/2022 received Spravato 84 mg today  02/09/2022 appointment with the following noted: Patient received Spravato 84 mg today.  She tolerated it well without any unusual headache, nausea or vomiting or other somatic symptoms.  Dissociation did occur and she gradually saw resolution over the 2-hour period of observation.  She does not typically find the dissociation very strong. No SE complaints with meds. Spravato clearly helps depression and OCD but easily gets overwhelmed and tearful with fairly routine stressors.  Tolerating meds. Sleep and appetite is OK Asks to increase lorazepam to 2 mg AM and HS and 1mg  afternoon  02/16/22 appt noted: Patient received Spravato 84 mg today.  She tolerated it well without any unusual headache, nausea or vomiting or other somatic symptoms.  Dissociation did  occur and she gradually saw resolution over the 2-hour period of observation.  She does not typically find the dissociation very strong. No SE complaints with meds. She has chronic depesssion and OCD but is improved with Spravato, both dx versus before.  She has continued Luvox 400 mg and Wellbutrin 450 mg and is tolerating it.  Chronically easily stressed.  Tolerating all meds.  Doesn't like taking more meds.  Spending a couple hours daily with OCD.  No SI No med changes.  02/21/22 appt noted:   Doesn't like taking more meds.  Spending a couple hours daily with OCD.  No SI No med changes.  02/21/22 appt noted: Patient received  Spravato 84 mg today.  She tolerated it well without any unusual headache, nausea or vomiting or other somatic symptoms.  Dissociation did occur and she gradually saw resolution over the 2-hour period of observation.  She does not typically find the dissociation very strong. No SE complaints with meds. She has chronic depesssion and OCD but is improved with Spravato, both dx versus before.  She has continued Luvox 400 mg and Wellbutrin 450 mg and is tolerating it.  Chronically easily overwhelmed and doesn't know why.  Tolerating all meds. Wants to continue meds.  03/16/22 appt noted: Patient received Spravato 84 mg today.  She tolerated it well without any unusual headache, nausea or vomiting or other somatic symptoms.  Dissociation did occur and she gradually saw resolution over the 2-hour period of observation.  She does not typically find the dissociation very strong. No SE complaints with meds. Overall she still feels the Spravato has been helpful not only for her depression but also for her OCD which was somewhat unexpected.  OCD is still significant but it is less severe than prior to starting Spravato.  She is tolerating Luvox 400 mg and Wellbutrin 450 mg.  We discussed possible med adjustments.  03/23/22 appt noted: Patient received Spravato 84 mg today.  She tolerated it well without any unusual headache, nausea or vomiting or other somatic symptoms.  Dissociation did occur and she gradually saw resolution over the 2-hour period of observation.  She does not typically find the dissociation very strong. No SE complaints with meds. She is still depressed and still has OCD of course but is improved with the Spravato.  She is tolerating the medications well.  We had previously discussed the possibility of switching some of the Wellbutrin to [facility] and she is very interested in that in hopes of further improvement in depression and OCD.  She understands that Auvelity is not used for OCD on the label.   She is tolerating the medications.  She is still easily overwhelmed.  She is sleeping and eating okay.. Plan: Reduce Wellbutrin XL to 300 mg AM and add Auvelity 1 tablet each AM  03/30/22 appt noted: Patient received Spravato 84 mg today.  She tolerated it well without any unusual headache, nausea or vomiting or other somatic symptoms.  Dissociation did occur and she gradually saw resolution over the 2-hour period of observation.  She does not typically find the dissociation very strong. No SE complaints with meds. She is still depressed and still has OCD of course but is improved with the Spravato.  She is tolerating the medications well.  No difference with Auvelity 1 AM so far and no SE.  Going on vacation on Saturday. 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 Activity: Not on file  Stress: Not on file  Social Connections: Not on file  Intimate Partner Violence: Not on file    Past Medical History, Surgical history, Social history, and Family history were reviewed and updated as appropriate.   Please  see review of systems for further details on the patient's review from today.   Objective:   Physical Exam:  LMP  (LMP Unknown)   Physical Exam Constitutional:      General: She is not in acute distress. Neurological:     Mental Status: She is alert and oriented to person, place, and time.     Cranial Nerves: No dysarthria.     Motor: Weakness present.     Gait: Gait abnormal.  Psychiatric:        Attention and Perception: Attention and perception normal.        Mood and Affect: Mood is anxious and depressed.        Speech: Speech normal.        Behavior: Behavior normal. Behavior is cooperative.        Thought Content: Thought content normal. Thought content is not delusional. Thought content does not include homicidal or suicidal ideation. Thought content does not include suicidal plan.        Cognition and Memory: Cognition and memory normal. Cognition is not impaired.        Judgment: Judgment normal.     Comments: Insight intact Ongoing OCD remains fairly severe but less anxious Checking compulsions up to 2 hours daily but improved noticeably with Spravato Chronic depression persistent but better with Spravato  and helps OCD too.        Lab Review:     Component Value Date/Time   NA 138 06/11/2021 0606   K 4.0 06/11/2021 0606   CL 107 06/11/2021 0606   CO2 26 06/11/2021 0606   GLUCOSE 90 06/11/2021 0606   BUN 18 06/11/2021 0606   CREATININE 0.81 06/11/2021 0606   CALCIUM 9.4 06/11/2021 0606   PROT 6.5 06/11/2021 0606   ALBUMIN 3.3 (L) 06/11/2021 0606   AST 17 06/11/2021 0606   ALT 14 06/11/2021 0606   ALKPHOS 141 (H) 06/11/2021 0606   BILITOT 0.2 (L) 06/11/2021 0606   GFRNONAA >60 06/11/2021 0606   GFRAA >60 07/09/2016 1610       Component Value Date/Time  WBC 5.8 06/11/2021 0606   RBC 4.12 06/11/2021 0606   HGB 12.5 06/11/2021 0606   HCT 39.7 06/11/2021 0606   PLT 299 06/11/2021 0606   MCV 96.4 06/11/2021 0606   MCH 30.3 06/11/2021 0606   MCHC  31.5 06/11/2021 0606   RDW 13.9 06/11/2021 0606   LYMPHSABS 1.9 06/11/2021 0606   MONOABS 0.5 06/11/2021 0606   EOSABS 0.1 06/11/2021 0606   BASOSABS 0.0 06/11/2021 0606    No results found for: "POCLITH", "LITHIUM"   No results found for: "PHENYTOIN", "PHENOBARB", "VALPROATE", "CBMZ"   .res Assessment: Plan:    Casey "[name]" was seen today for follow-up, depression, anxiety and sleeping problem.  Diagnoses and all orders for this visit:  Recurrent major depression resistant to treatment [facility])  Mixed obsessional thoughts and acts  Social anxiety disorder  Caregiver stress  Migraine without aura and without status migrainosus, not intractable  Insomnia due to mental condition     Both primary Dx of OCD and major depression are TR and marked.  Impaired function but less so with Spravato re: depression..   She is receiving Spravato 84 mg weekly and moderate improvement in the depression..  she feels it also helps OCD somewhat.  However still easily overwhelmed with low stress tolerance.  Family contributes to her anxiety and stress markedly.  The OCD is improved with the increase in fluvoxamine and with Spravato.  Spends up to 2 hours daily and checking compulsions on her worst days but better when she travels.  No new options for tx are evident.  She has been on higher doses of fluvoxamine above the usual max of 400 mg daily in the past and finds it more helpful at the higher dose.    Disc SE.   She is tolerating the meds well  Continue  Luvox back to 400 mg nightly as of January 2023. Disc dosing higher than usual.  She feels this is increase has helped more with OCD which remains chronically severe.  Contiinue Auvelity 1 AM and  Wants to continue Wellbutrin XL 150 BID.  Option increase Auvelity BID and reduce Wellbutrin to 1 AM. But she didn't think it made a lot more difference than 1 Auveltiy daily when taken in summer 2023. There are few other alternative medication  options that remain.   Disc DDI issues.    Disc Spravato DT TRD incl details and SE. Disc dosing and duration.  Pt with severe depression MADRS 36 on 06/29/21  Patient was administered Spravato 84 mg intranasally dosage today.  The patient experienced the typical dissociation which gradually resolved over the 2-hour period of observation.  There were no complications.  Specifically the patient did not have nausea or vomiting or headache.  Blood pressures remained within normal ranges at the 40-minute and 2-hour follow-up intervals.  By the time the 2-hour observation period was met the patient was alert and oriented and able to exit without assistance. She tends to have lingering sedative effects but not severe. .  See nursing note for further details.  Didn't feel as well when off spravato but disc family issurs interfering with frequency.  We discussed the short-term risks associated with benzodiazepines including sedation and increased fall risk among others.  Discussed long-term side effect risk including dependence, potential withdrawal symptoms, and the potential eventual dose-related risk of dementia.  But recent studies from 2020 dispute this association between benzodiazepines and dementia risk. Newer studies in 2020 do not support an association with dementia. Disc  this is high dose and not ideal.  Also disc risk combining it with temazepam. Rec try gradually reduce HS lorazepam to 1 mg Hs.  Can continue lorazepam 2 mg AM and 1 mg in afternoon bc of chronic anxiety and it is helpful and tolerated. She can continue temazepam 30 mg nightly.  She tends to have a lot of anxious negative thoughts at night when she is trying to go to bed.  She is trying to reduce the dose.  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 She doesn't want to try this now.  Complaining of HA and history migraine.  Asks for increase imitrex and disc preventatives like propranolol ER imitrex to  100 mg prn migraine and propranolol ER for migraine prevention.  Supportive therapy dealing with some of the recent stressors including son's autism and recent issues of rebelliousness with him.    Plan no change indicated:   she agrees.  No obvious changes. Continue Auvelity 1 in AM Continue Wellbutrin XL 150 mg BID Fluvoxamine 100 mg AM and 300 mg PM above usual max bc med necessary Lorazepam 2 mg HS and prn 1 mg BID prn anxiety daily Temazepam 30 mg HS Propranolol ER 60 mg daily Imitrex prn migraine  Follow-up every week if possible and try to be consistent. That is her plan but family demands do interfere at times.     Meredith Staggers, MD, DFAPA  Please see After Visit Summary for patient specific instructions.  Future Appointments  Date Time Provider Department Center  06/20/2023 11:00 AM Robley Fries, PhD [facility] None  07/04/2023 11:00 AM Robley Fries, PhD [facility] None  07/18/2023 11:00 AM Robley Fries, PhD [facility] None  08/01/2023 11:00 AM Robley Fries, PhD [facility] None  08/16/2023  6:00 PM Robley Fries, PhD [facility] None  08/30/2023  6:00 PM Robley Fries, PhD [facility] None  09/20/2023  6:00 PM Mitchum, Molly Maduro, PhD CP-[facility] None    No orders of the defined types were placed in this encounter.    -------------------------------
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
## 2023-06-13 ENCOUNTER — Ambulatory Visit: Payer: 59

## 2023-06-13 ENCOUNTER — Ambulatory Visit (INDEPENDENT_AMBULATORY_CARE_PROVIDER_SITE_OTHER): Payer: 59 | Admitting: Psychiatry

## 2023-06-13 VITALS — BP 91/61 | HR 68

## 2023-06-13 DIAGNOSIS — F422 Mixed obsessional thoughts and acts: Secondary | ICD-10-CM | POA: Diagnosis not present

## 2023-06-13 DIAGNOSIS — F339 Major depressive disorder, recurrent, unspecified: Secondary | ICD-10-CM

## 2023-06-13 DIAGNOSIS — F5105 Insomnia due to other mental disorder: Secondary | ICD-10-CM

## 2023-06-13 DIAGNOSIS — Z636 Dependent relative needing care at home: Secondary | ICD-10-CM

## 2023-06-13 DIAGNOSIS — G43009 Migraine without aura, not intractable, without status migrainosus: Secondary | ICD-10-CM | POA: Diagnosis not present

## 2023-06-13 DIAGNOSIS — F4010 Social phobia, unspecified: Secondary | ICD-10-CM

## 2023-06-13 NOTE — Progress Notes (Signed)
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 Pt is being treated for Treatment Resistant Depression, Pt taken to treatment room. Pt's medication is charged through [employer].  Medication is stored behind 2 locked doors, it is never given to the pt until time of administration which is observed by the nurse. Disposed of per FDA/REMS regulations. All Spravato Treatments are documented in Spravato REMS per protocol of being a treatment center. Spravato is a CIII medication and has to be only given at a treatment facility and observed by nurse as pt administered intranasally   She was directed to the treatment room to get vitals taken first. Initial vital signs are B/P at 3:15 PM 107/82, 73, Pt instructed to blow her nose and to recline back at 45 degrees. Pt given first nasal spray (28 mg) administered by pt observed by nurse. There were 5 minutes between each dose, total of 84 mg. Tolerated well. Assessed pt's 40 minute vital signs at 4:00 PM 94/66, 73.  Pt met with [name] out of office and they discussed her care at the end of her treatment when her thoughts are clearer and her medication and moods. She does go to the bathroom at least once during her treatment. No sedation and had slight feeling of being "high" she reports. Discharge vitals at 5:05 PM 91/61, 68. Pt stable for discharge.  Pt was observed on site a total of 120 minutes per FDA/REMS requirements. Pt was with nurse for clinical assessment 50 minutes. pt will contact nurse when able to return to treatment.    LOT [id number]. APR 2027

## 2023-06-16 ENCOUNTER — Encounter: Payer: Self-pay | Admitting: Psychiatry

## 2023-06-16 ENCOUNTER — Other Ambulatory Visit: Payer: Self-pay

## 2023-06-16 MED ORDER — SPRAVATO (84 MG DOSE) 28 MG/DEVICE NA SOPK: PACK | NASAL | 1 refills | Status: DC

## 2023-06-16 NOTE — Progress Notes (Signed)
Casey Diaz [id number] 01/04/1966 57 y.o.    Subjective:   Patient ID:  Casey Diaz is a 57 y.o. (DOB [birth year]) female.  Chief Complaint:  Chief Complaint  Patient presents with   Follow-up   Depression   Anxiety   Stress     HPI Casey Diaz presents to the office today for follow-up of OCD and severe anxiety.     December 2019 visit the following was noted: No meds were changed. Lives in [name] and back for followup.  Sx are about the same.  Has to take meds with different sizes. Pt reports that mood is Anxious and Depressed and describes anxiety as Severe. Anxiety symptoms include: Excessive Worry, Obsessive Compulsive Symptoms:   Checking,,. Pt reports has interrupted sleep and nocturia. Pt reports that appetite is good. Pt reports that energy is no change and down slightly. Concentration is down slightly. Suicidal thoughts:  denied by patient. Loves the environment of [name] but misses some things there.  She's not able to work there.  H works there and likes it.  Struggled with not working, feels isolated and not up to task of meeting people.  Does attend a church and met a friend who's been helpful.  Leaving for [name] on 10/16/18.   04/09/2020 appointment the following is noted:  Staying another year in [name] bc Covid and other things. Last few months a lot of crying spells.  Is in menopause. Wonders about med changes though is nervous about it.  Crying spells associated with depressing thoughts more than stress or OCD.   Covid really hard on everyone and couldn't see family for 18 mos.  Family still very dysfunctional. No close friends in part due to OCD and depression. Son high Autism spectrum with ADHD and anxiety and she's with him all the time. Greater health problems with CP so more pains.   05/15/20 appt with the following noted: Casey Diaz for menopause and helps some. Still depressed.  Chronically. In [place] for 2 more weeks then to [name] for  another year. A lot of stressors lately triggering more checking and anxiety.   OCD is her CC now and seems.  Got worse DT stress.   Stressed with Asberger's son and her health.  H works a lot.  Her FOO still stress. Plan: Trintellix 10 mg 1 tablet in the morning with food and reduce fluvoxamine to 5 tablets nightly for 1 week  then reduce it to 4 tablets nightly.   07/02/20 appt with the following noted: Decided not to get Trintellix bc difficulty getting it. It is available.  There.  Wants to start it now.   Both depression and OCD are severe.  Not suicidal in intent or plan. Did not take samples with her to [name] but will be back in December. covid is worse there and travel is difficult.  Wants to reduce Wellbutrin DT dry mouth. Plan: She's afraid to reduce Luvox at this time DT fear of worsening OCD.  But will consider. Trintellix 10 mg 1 tablet in the morning with food and reduce fluvoxamine to 5 tablets nightly for 1 week  then reduce it to 4 tablets nightly. Also reduce Wellbutrin XL to 300 mg daily.    9-13 2022 appointment with the following noted: Back in [place] since July 14.  Broke arm a month ago and surgery.  It's all been rough adjustment.   B has cancer on his face and M fell taking him to the doctor.  Misses the water and weather of [name].   Cry a lot more since menopause. Still depression and anxiety and OCD.  Asks about ketamine. On Wellbutrin 300, Luvox 300.  No Trintellix. Added Ativan 2 mg AM and HS and it helps.  More likely to get upset at night. Plan: Increase Luvox back to 400 mg daily.  She thinks she's worse on less. Continue Wellbutrin XL to 300 mg daily. 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  She tolerated it well without unusual nausea or vomiting headache or other somatic symptoms.  She did have the expected dissociation which gradually resolved over  the course of the 2-hour period of observation.  She was a little concerned about her balance given her cerebral palsy but has not noted unusual or unexpected problems.  She is motivated to can continue Spravato in hopes of reducing her depressive symptoms. 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  She also has treatment resistant OCD which is partially managed with medications but is still quite disabling.  She is tolerating the medications well.  She is sleeping adequately.  Her appetite is adequate.  She is not having suicidal thoughts.  She continues to wish for a better treatment for OCD that would give her some relief.  09/30/2021 appointment with the following noted: She received her first dose of Spravato 84 mg intranasally today.  She tolerated it well without unusual nausea, vomiting, or other somatic symptoms.  Dissociation as expected did occur and gradually resolved over the 2-hour period of observation.  She did have a mild headache today with the treatment and received ibuprofen 600 mg at her request.  We will follow this to see if it is a pattern Patient is still depressed.  She said she was late with her medicine today and today was a particularly depressing day.  However she notes that the Spravato has lifted her mood considerably even today.  She is hopeful that it will continue to be helpful.  No suicidal thoughts.  She has ongoing chronic anxiety and OCD at baseline.  10/04/21 appt noted: Patient received Spravato 84 mg for the second time today.  She tolerated it well without any unusual headache, nausea or vomiting or other somatic symptoms.  Dissociation did occur and she gradually [place] resolution over the 2-hour period of observation. She did not have any unusual problems after she left the office last Spravato administration.  She did not have any specific problems with balance or walking.  She is at increased risk of that difficulty because of  cerebral palsy.  So far she has not noticed much mood effect from the medication beyond the first day of receiving it.  However she would like to continue Spravato in hopes of getting the antidepressant effect that is desired. Stress dealing with mother's behavior at party pt hosted.  Guilt over it.  10/07/2021 appointment noted: Patient received Spravato 84 mg for the second time today.  She tolerated it well without any unusual headache, nausea or vomiting or other somatic symptoms.  Dissociation did occur and she gradually [place] resolution over the 2-hour period of observation. She still is not sure about the antidepressant effect of Spravato.  Events over the holidays and demands, make it difficult to assess.  She still notes that the OCD tends to worsen the depression and vice versa.  She tolerates the  Spravato well and wants to continue the trial.  10/15/2021 appointment with the following noted: Patient received Spravato 84 mg for the second time today.  She tolerated it well without any unusual headache, nausea or vomiting or other somatic symptoms.  Dissociation did occur and she gradually [place] resolution over the 2-hour period of observation. Patient says it was somewhat difficult to evaluate the effect of the Spravato.  It was scheduled to be twice weekly for 4 weeks consecutively but the holidays have interfered with that administration.  She asked what specifically should be she should be looking for in order to assess improvement.  That was discussed.  The OCD is unchanged and the depression so far is not significantly different.  She still tolerates meds.  There have been no recent med changes  10/19/2021 appt noted: Patient received Spravato 84 mg for the second today.  She tolerated it well without any unusual headache, nausea or vomiting or other somatic symptoms.  Dissociation did occur and she gradually saw resolution over the 2-hour period of observation.   10/21/2021 appointment  noted: Patient received Spravato 84 mg today.  She tolerated it well without any unusual headache, nausea or vomiting or other somatic symptoms.  Dissociation did occur and she gradually saw resolution over the 2-hour period of observation.  She feels better than last week.  She is not as depressed and down.  She is still dealing with grief around the death of her cousin that was unexpected.  It is still difficult to tell how much the Spravato was doing but she is hopeful.  Anxiety is still present with the OCD.  She is not having suicidal thoughts.  She is not hopeless.  She wants to continue treatment.  10/25/2021 appointment with the following noted: Patient received Spravato 84 mg today.  She tolerated it well without any unusual headache, nausea or vomiting or other somatic symptoms.  Dissociation did occur and she gradually saw resolution over the 2-hour period of observation.  She does not typically find the dissociation very strong. She is beginning to think the Spravato is helping somewhat with the depression.  It has been difficult to tell with the holidays intervening as well as the death of her cousin.  She has not been able to get Spravato twice weekly for 4 weeks straight as typically planned.  However she is hopeful.  The OCD remains significant.  She still has a tendency to think very negatively.  She is not suicidal.  10/28/2021 appointment with the following noted: Patient received Spravato 84 mg today.  She tolerated it well without any unusual headache, nausea or vomiting or other somatic symptoms.  Dissociation did occur and she gradually saw resolution over the 2-hour period of observation.  She does not typically find the dissociation very strong. She is feeling more hopeful about the administration of Spravato.  She is having less depression she believes.  Still not dramatically different.  She still has a tendency to have a lot of anxiety and rumination and OCD.  She is not suicidal.   She is eager to continue the Spravato.  11/01/2021 appointment with the following noted: Patient received Spravato 84 mg today.  She tolerated it well without any unusual headache, nausea or vomiting or other somatic symptoms.  Dissociation did occur and she gradually saw resolution over the 2-hour period of observation.  She does not typically find the dissociation very strong. She is continuing to see a little bit of improvement in depression  with Spravato.  The anxiety remains but may be not as severe.  The OCD remains markedly severe chronically.  She is not suicidal.  She is encouraged by the degree of improvement with Spravato and inability to enjoy things more and not be quite as ruminative.  11/04/2021 appt noted: Patient received Spravato 84 mg today.  She tolerated it well without any unusual headache, nausea or vomiting or other somatic symptoms.  Dissociation did occur and she gradually saw resolution over the 2-hour period of observation.  She does not typically find the dissociation very strong. No SE complaints with meds. She continues to feel hopeful about the Spravato.  She has less depression.  Because of a number of factors she is uncertain of the full benefit but thinks she is somewhat less depressed.  Her anxiety and OCD remain significant but a little better.  She is tolerating the medications and does not desire medicine change.  She is not currently complaining of insomnia.   11/08/2021 appointment the following noted: Patient received Spravato 84 mg today.  She tolerated it well without any unusual headache, nausea or vomiting or other somatic symptoms.  Dissociation did occur and she gradually saw resolution over the 2-hour period of observation.  She does not typically find the dissociation very strong. No SE complaints with meds. She feels the Spravato is helping somewhat.  She would like to see a greater effect.  However she is able to enjoy things.  She is productive at home.   She would like to see a lifting of a degree of sadness that remains.  The anxiety and OCD remained largely unchanged.  She wondered about the dosing of Wellbutrin 300 mg a day and Luvox 300 mg a day and possible increases.  She has been at higher doses in the past.  She plans to start water therapy for her weakness and for her shoulder.  11/11/2021 appointment with the following noted: Patient received Spravato 84 mg today.  She tolerated it well without any unusual headache, nausea or vomiting or other somatic symptoms.  Dissociation did occur and she gradually saw resolution over the 2-hour period of observation.  She does not typically find the dissociation very strong. No SE complaints with meds. She feels the Spravato is clearly helping the depression.  She would like to see a more significant effect.  She is still having trouble thinking positive. Her energy is fair.  Concentration is good except for the problem with chronic obsessions. She has been taking Wellbutrin 300 mg in Luvox 300 mg for quite some time but has taken higher doses in the past.  We discussed that.  She would like to try higher doses in order to get a better effect if possible. We just increased the doses a couple of days ago.  No effect yet.  11/15/2021 appointment with the following noted: Patient received Spravato 84 mg today.  She tolerated it well without any unusual headache, nausea or vomiting or other somatic symptoms.  Dissociation did occur and she gradually saw resolution over the 2-hour period of observation.  She does not typically find the dissociation very strong. No SE complaints with meds. The patient is now convinced that the Spravato is helping the depression.  She would like to continue twice weekly Spravato this week if possible.  She has tolerated the increase in Wellbutrin to 450 mg daily and the increase and fluvoxamine to 400 mg daily without complications thus far.  The OCD and anxiety feed  the  depression to some extent. She spends approximately 2 hours daily with checking compulsions due to obsessions about causing harm to others.  For example fearing that when she has hit a pot hole that she may have hit a person and going back to check.  Checking corners and rooms out of fear that she may have harmed someone.  Other various checking compulsions.  She is hoping the increase in fluvoxamine to 400 mg will reduce that over the weeks to come.  She is not seeing a significant difference with the addition of the Spravato though she understands that was not expected.  She is more productive at home and more motivated and able to enjoy things more fully as a result of the Spravato treatment.  She is tolerating the medication  11/18/2021 appointment with the following noted: Patient received Spravato 84 mg today.  She tolerated it well without any unusual headache, nausea or vomiting or other somatic symptoms.  Dissociation did occur and she gradually saw resolution over the 2-hour period of observation.  She does not typically find the dissociation very strong. No SE complaints with meds. She clearly believes the Spravato has been helpful for the depression.  She wonders whether to continue to treatments weekly or to cut back to 1 weekly.  She would like to continue twice weekly in hopes of getting additional improvement in the depression because it is not resolved but it is difficult to get here twice a week in terms of arranging rides. She is recently increased Wellbutrin XL to 450 mg daily and fluvoxamine to 400 mg daily but they have not had time to have an official effect.  She is tolerating that well.  She is tolerating meds overwork overall well. The OCD remains the same as noted on 11/15/2021  11/25/21 appt noted: Patient received Spravato 84 mg today.  She tolerated it well without any unusual headache, nausea or vomiting or other somatic symptoms.  Dissociation did occur and she gradually saw  resolution over the 2-hour period of observation.  She does not typically find the dissociation very strong. No SE complaints with meds. She thinks the increase in Wellbutrin and Luvox have been potentially helpful for depression and OCD respectively.  It has been too early to see the full effect.  She is sleeping and eating well.  She is functioning at home.  She still spends a lot of time that is about 2 hours a day dealing with compulsive behaviors.  12/02/21 appt noted: Patient received Spravato 84 mg today.  She tolerated it well without any unusual headache, nausea or vomiting or other somatic symptoms.  Dissociation did occur and she gradually saw resolution over the 2-hour period of observation.  She does not typically find the dissociation very strong. No SE complaints with meds. Several losses and stressors recently that affect her sense of mood. However still sees significant benefit from the Spravato for her depression.  Wants to continue it. Suspect early  some benefit from the increased Wellbutrin for depression and Luvox for OCD. Tolerating meds. No complaints about the meds. Sleeping and eating well.  No new health concerns.  12/09/21 appt noted: Patient received Spravato 84 mg today.  She tolerated it well without any unusual headache, nausea or vomiting or other somatic symptoms.  Dissociation did occur and she gradually saw resolution over the 2-hour period of observation.  She does not typically find the dissociation very strong. No SE complaints with meds. Seeing noticeable improvement from  increase fluvoxamine to 400 mg daily.  Tolerating meds without concerns over them. Depression is stable with residual sx of easy guilt and easily stressed.  OCD contributes to depression but depression is not severe with less crying spells.  Productive at home with chores.  Enjoyed recent birthday.  Sleeping good. No new concerns.  12/23/2021 appointment noted: Patient received Spravato 84 mg  today.  She tolerated it well without any unusual headache, nausea or vomiting or other somatic symptoms.  Dissociation did occur and she gradually saw resolution over the 2-hour period of observation.  She does not typically find the dissociation very strong. No SE complaints with meds. Seeing noticeable improvement from increase fluvoxamine to 400 mg daily.  Tolerating meds without concerns over them. Her depression is somewhat improved with the Spravato.  She also feels generally a little lighter.  She is more motivated.  She is less overwhelmed by guilt.  The OCD is gradually improving but is still quite time-consuming as noted before.  She is sleeping well.  No side effects  12/30/2021 appointment with the following noted: Patient received Spravato 84 mg today.  She tolerated it well without any unusual headache, nausea or vomiting or other somatic symptoms.  Dissociation did occur and she gradually saw resolution over the 2-hour period of observation.  She does not typically find the dissociation very strong. No SE complaints with meds. Seeing noticeable improvement from increase fluvoxamine to 400 mg daily.  Tolerating meds without concerns over them. She is confident of her the improvement seen with Spravato.  She is less hopeless.  Guilt is marked remarkably improved.  She is not having any thoughts of death or dying.  She is more motivated for activities such as exercise which she is recently started.  She is sleeping well. The OCD remains severe but it is improving somewhat with the increase in fluvoxamine.  It is still consuming a couple hours per day.  01/10/22 apravato 84 admin  01/24/22 appt noted: Patient received Spravato 84 mg today.  She tolerated it well without any unusual headache, nausea or vomiting or other somatic symptoms.  Dissociation did occur and she gradually saw resolution over the 2-hour period of observation.  She does not typically find the dissociation very strong. No  SE complaints with meds. Very tearful today.  Feels like she has been suppressing emotion in the Spravato caused it to be released.  Discussed some stressors.  Overall still feels the medicine is helpful.  She has missed some of the scheduled Spravato treatments that were intended to be weekly due to circumstances beyond her control.  She is still struggling with OCD as previously noted but does believe the medications are helpful. Plan no med changes  01/31/2022 received Spravato 84 mg today  02/09/2022 appointment with the following noted: Patient received Spravato 84 mg today.  She tolerated it well without any unusual headache, nausea or vomiting or other somatic symptoms.  Dissociation did occur and she gradually saw resolution over the 2-hour period of observation.  She does not typically find the dissociation very strong. No SE complaints with meds. Spravato clearly helps depression and OCD but easily gets overwhelmed and tearful with fairly routine stressors.  Tolerating meds. Sleep and appetite is OK Asks to increase lorazepam to 2 mg AM and HS and 1mg  afternoon  02/16/22 appt noted: Patient received Spravato 84 mg today.  She tolerated it well without any unusual headache, nausea or vomiting or other somatic symptoms.  Dissociation did  occur and she gradually saw resolution over the 2-hour period of observation.  She does not typically find the dissociation very strong. No SE complaints with meds. She has chronic depesssion and OCD but is improved with Spravato, both dx versus before.  She has continued Luvox 400 mg and Wellbutrin 450 mg and is tolerating it.  Chronically easily stressed.  Tolerating all meds.  Doesn't like taking more meds.  Spending a couple hours daily with OCD.  No SI No med changes.  02/21/22 appt noted:   Doesn't like taking more meds.  Spending a couple hours daily with OCD.  No SI No med changes.  02/21/22 appt noted: Patient received Spravato 84 mg today.  She  tolerated it well without any unusual headache, nausea or vomiting or other somatic symptoms.  Dissociation did occur and she gradually saw resolution over the 2-hour period of observation.  She does not typically find the dissociation very strong. No SE complaints with meds. She has chronic depesssion and OCD but is improved with Spravato, both dx versus before.  She has continued Luvox 400 mg and Wellbutrin 450 mg and is tolerating it.  Chronically easily overwhelmed and doesn't know why.  Tolerating all meds. Wants to continue meds.  03/16/22 appt noted: Patient received Spravato 84 mg today.  She tolerated it well without any unusual headache, nausea or vomiting or other somatic symptoms.  Dissociation did occur and she gradually saw resolution over the 2-hour period of observation.  She does not typically find the dissociation very strong. No SE complaints with meds. Overall she still feels the Spravato has been helpful not only for her depression but also for her OCD which was somewhat unexpected.  OCD is still significant but it is less severe than prior to starting Spravato.  She is tolerating Luvox 400 mg and Wellbutrin 450 mg.  We discussed possible med adjustments.  03/23/22 appt noted: Patient received Spravato 84 mg today.  She tolerated it well without any unusual headache, nausea or vomiting or other somatic symptoms.  Dissociation did occur and she gradually saw resolution over the 2-hour period of observation.  She does not typically find the dissociation very strong. No SE complaints with meds. She is still depressed and still has OCD of course but is improved with the Spravato.  She is tolerating the medications well.  We had previously discussed the possibility of switching some of the Wellbutrin to [facility] and she is very interested in that in hopes of further improvement in depression and OCD.  She understands that Auvelity is not used for OCD on the label.  She is tolerating the  medications.  She is still easily overwhelmed.  She is sleeping and eating okay.. Plan: Reduce Wellbutrin XL to 300 mg AM and add [place]vel[place] 1 tablet each AM  03/30/22 appt noted: Patient received Spravato 84 mg today.  She tolerated it well without any unusual headache, nausea or vomiting or other somatic symptoms.  Dissociation did occur and she gradually saw resolution over the 2-hour period of observation.  She does not typically find the dissociation very strong. No SE complaints with meds. She is still depressed and still has OCD of course but is improved with the Spravato.  She is tolerating the medications well.  No difference with Auvelity 1 AM so far and no SE.  Going on vacation on Saturday. 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 Medical History:  Diagnosis Date   Abnormal Pap smear 2011   hpv/mild dysplasia,cin1   Anxiety    Cerebral palsy (HCC)    right arm/leg   Cystocele    Depression    Headache    Neuromuscular disorder (HCC)    Cerebral Palsy   OCD (obsessive compulsive disorder)    Osteoporosis    Uterine  prolaps     Family History  Problem Relation Age of Onset   Cancer Father        skin AND LUNG   Alcohol abuse Sister        CRACK COCAINE    Social History   Socioeconomic History   Marital status: Married    Spouse name: Not on file   Number of children: Not on file   Years of education: Not on file   Highest education level: Not on file  Occupational History   Not on file  Tobacco Use   Smoking status: Never   Smokeless tobacco: Never  Substance and Sexual Activity   Alcohol use: Not Currently    Comment: OCCASIONAL beer   Drug use: No   Sexual activity: Yes    Birth control/protection: Pill    Comment: LOESTRIN 24 FE  Other Topics Concern   Not on file  Social History Narrative   Not on file   Social Determinants of Health   Financial Resource Strain: Not on file  Food Insecurity: Not on file  Transportation Needs: Not on file  Physical Activity: Not on file  Stress: Not on file  Social Connections: Not on file  Intimate Partner Violence: Not on file    Past Medical History, Surgical history, Social history, and Family history were reviewed and updated as appropriate.   Please see review of systems for further details on the patient's review from today.   Objective:   Physical Exam:  LMP  (LMP Unknown)   Physical Exam Constitutional:      General: She is not in acute distress. Neurological:     Mental Status: She is alert and oriented to person, place, and time.     Cranial Nerves: No dysarthria.     Motor: Weakness present.     Gait: Gait abnormal.  Psychiatric:        Attention and Perception: Attention and perception normal.        Mood and Affect: Mood is anxious and depressed. Affect is not tearful.        Speech: Speech normal.        Behavior: Behavior normal. Behavior is cooperative.  Thought Content: Thought content normal. Thought content is not delusional. Thought content does not include homicidal or suicidal ideation. Thought  content does not include suicidal plan.        Cognition and Memory: Cognition and memory normal. Cognition is not impaired.        Judgment: Judgment normal.     Comments: Insight intact Ongoing OCD remains fairly severe but less anxious Checking compulsions up to 2 hours daily but improved noticeably with Spravato Chronic depression persistent but better with Spravato  and helps OCD too.        Lab Review:     Component Value Date/Time   NA 138 06/11/2021 0606   K 4.0 06/11/2021 0606   CL 107 06/11/2021 0606   CO2 26 06/11/2021 0606   GLUCOSE 90 06/11/2021 0606   BUN 18 06/11/2021 0606   CREATININE 0.81 06/11/2021 0606   CALCIUM 9.4 06/11/2021 0606   PROT 6.5 06/11/2021 0606   ALBUMIN 3.3 (L) 06/11/2021 0606   AST 17 06/11/2021 0606   ALT 14 06/11/2021 0606   ALKPHOS 141 (H) 06/11/2021 0606   BILITOT 0.2 (L) 06/11/2021 0606   GFRNONAA >60 06/11/2021 0606   GFRAA >60 07/09/2016 0438       Component Value Date/Time   WBC 5.8 06/11/2021 0606   RBC 4.12 06/11/2021 0606   HGB 12.5 06/11/2021 0606   HCT 39.7 06/11/2021 0606   PLT 299 06/11/2021 0606   MCV 96.4 06/11/2021 0606   MCH 30.3 06/11/2021 0606   MCHC 31.5 06/11/2021 0606   RDW 13.9 06/11/2021 0606   LYMPHSABS 1.9 06/11/2021 0606   MONOABS 0.5 06/11/2021 0606   EOSABS 0.1 06/11/2021 0606   BASOSABS 0.0 06/11/2021 0606    No results found for: "POCLITH", "LITHIUM"   No results found for: "PHENYTOIN", "PHENOBARB", "VALPROATE", "CBMZ"   .res Assessment: Plan:    Eara "[name]" was seen today for follow-up, depression, anxiety and stress.  Diagnoses and all orders for this visit:  Recurrent major depression resistant to treatment [facility])  Mixed obsessional thoughts and acts  Social anxiety disorder  Migraine without aura and without status migrainosus, not intractable  Caregiver stress  Insomnia due to mental condition    Both primary Dx of OCD and major depression are TR and marked.  Impaired  function but less so with Spravato re: depression..   She is receiving Spravato 84 mg weekly and moderate improvement in the depression..  she feels it also helps OCD somewhat.  However still easily overwhelmed with low stress tolerance.  Family contributes to her anxiety and stress markedly.  The OCD is improved with the increase in fluvoxamine and with Spravato.  Spends up to 2 hours daily and checking compulsions on her worst days but better when she travels.  No new options for tx are evident.  She has been on higher doses of fluvoxamine above the usual max of 400 mg daily in the past and finds it more helpful at the higher dose.    Disc SE.   She is tolerating the meds well  Continue  Luvox back to 400 mg nightly as of January 2023. Disc dosing higher than usual.  She feels this is increase has helped more with OCD which remains chronically severe.  Contiinue Auvelity 1 AM and  Wants to continue Wellbutrin XL 150 BID.  Option increase Auvelity BID and reduce Wellbutrin to 1 AM. But she didn't think it made a lot more difference than 1 Auveltiy daily  when taken in summer 2023. Consider trying to increase it again.  We have discussed seizure risk that is possible using this combination but given severity of her symptoms she feels the risk is warranted. There are few other alternative medication options that remain.   Disc DDI issues.   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 She doesn't want to try this now.  Disc Spravato DT TRD incl details and SE. Disc dosing and duration.  Pt with severe depression MADRS 36 on 06/29/21  Patient was administered Spravato 84 mg intranasally dosage today.  The patient experienced the typical dissociation which gradually resolved over the 2-hour period of observation.  There were no complications.  Specifically the patient did not have nausea or vomiting or headache.  Blood pressures remained within normal ranges at the 40-minute  and 2-hour follow-up intervals.  By the time the 2-hour observation period was met the patient was alert and oriented and able to exit without assistance. She tends to have lingering sedative effects but not severe. .  See nursing note for further details.  Didn't feel as well when off spravato but disc family issurs interfering with frequency.  We discussed the short-term risks associated with benzodiazepines including sedation and increased fall risk among others.  Discussed long-term side effect risk including dependence, potential withdrawal symptoms, and the potential eventual dose-related risk of dementia.  But recent studies from 2020 dispute this association between benzodiazepines and dementia risk. Newer studies in 2020 do not support an association with dementia. Disc this is high dose and not ideal.  Also disc risk combining it with temazepam. Rec try gradually reduce HS lorazepam to 1 mg Hs.  Can continue lorazepam 2 mg AM and 1 mg in afternoon bc of chronic anxiety and it is helpful and tolerated. She can continue temazepam 30 mg nightly.  She tends to have a lot of anxious negative thoughts at night when she is trying to go to bed.  She is trying to reduce the dose.  Complaining of HA and history migraine.  Asks for increase imitrex and disc preventatives like propranolol ER imitrex to 100 mg prn migraine and propranolol ER for migraine prevention.  Supportive therapy dealing with some of the recent stressors including son's autism and recent issues of rebelliousness with him.    Plan no change indicated:   she agrees.  No obvious changes. Continue Auvelity 1 in AM Continue Wellbutrin XL 150 mg BID Fluvoxamine 100 mg AM and 300 mg PM above usual max bc med necessary Lorazepam 2 mg HS and prn 1 mg BID prn anxiety daily Temazepam 30 mg HS Propranolol ER 60 mg daily Imitrex prn migraine  Follow-up every week if possible and try to be consistent. That is her plan but family demands  do interfere at times.     Meredith Staggers, MD, DFAPA  Please see After Visit Summary for patient specific instructions.  Future Appointments  Date Time Provider Department Center  06/20/2023 11:00 AM Robley Fries, PhD [facility] None  07/04/2023 11:00 AM Robley Fries, PhD [facility] None  07/18/2023 11:00 AM Robley Fries, PhD [facility] None  08/01/2023 11:00 AM Robley Fries, PhD [facility] None  08/16/2023  6:00 PM Robley Fries, PhD [facility] None  08/30/2023  6:00 PM Robley Fries, PhD [facility] None  09/20/2023  6:00 PM Mitchum, Molly Maduro, PhD CP[facility] None    No orders of the defined types were placed in this encounter.    -------------------------------

## 2023-06-20 ENCOUNTER — Ambulatory Visit (INDEPENDENT_AMBULATORY_CARE_PROVIDER_SITE_OTHER): Payer: 59 | Admitting: Psychiatry

## 2023-06-20 DIAGNOSIS — F4010 Social phobia, unspecified: Secondary | ICD-10-CM

## 2023-06-20 DIAGNOSIS — F66 Other sexual disorders: Secondary | ICD-10-CM

## 2023-06-20 DIAGNOSIS — Z636 Dependent relative needing care at home: Secondary | ICD-10-CM | POA: Diagnosis not present

## 2023-06-20 DIAGNOSIS — F339 Major depressive disorder, recurrent, unspecified: Secondary | ICD-10-CM

## 2023-06-20 DIAGNOSIS — F422 Mixed obsessional thoughts and acts: Secondary | ICD-10-CM

## 2023-06-20 DIAGNOSIS — Z638 Other specified problems related to primary support group: Secondary | ICD-10-CM
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
## 2023-06-27 ENCOUNTER — Ambulatory Visit (INDEPENDENT_AMBULATORY_CARE_PROVIDER_SITE_OTHER): Payer: 59 | Admitting: Psychiatry

## 2023-06-27 ENCOUNTER — Encounter: Payer: Self-pay | Admitting: Psychiatry

## 2023-06-27 ENCOUNTER — Ambulatory Visit: Payer: 59

## 2023-06-27 VITALS — BP 124/89 | HR 62

## 2023-06-27 DIAGNOSIS — F5105 Insomnia due to other mental disorder: Secondary | ICD-10-CM

## 2023-06-27 DIAGNOSIS — F339 Major depressive disorder, recurrent, unspecified: Secondary | ICD-10-CM

## 2023-06-27 DIAGNOSIS — G43009 Migraine without aura, not intractable, without status migrainosus: Secondary | ICD-10-CM | POA: Diagnosis not present

## 2023-06-27 DIAGNOSIS — F4010 Social phobia, unspecified: Secondary | ICD-10-CM | POA: Diagnosis not present

## 2023-06-27 DIAGNOSIS — F422 Mixed obsessional thoughts and acts: Secondary | ICD-10-CM

## 2023-06-27 MED ORDER — AUVELITY 45-105 MG PO TBCR
1.0000 | EXTENDED_RELEASE_TABLET | Freq: Two times a day (BID) | ORAL | 1 refills | Status: DC
Start: 2023-06-27 — End: 2023-09-18

## 2023-06-27 MED ORDER — FLUVOXAMINE MALEATE 100 MG PO TABS: ORAL_TABLET | ORAL | 0 refills | Status: DC

## 2023-06-27 NOTE — Progress Notes (Signed)
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 Pt is being treated for Treatment Resistant Depression, Pt taken to treatment room. Pt's medication is charged through [employer].  Medication is stored behind 2 locked doors, it is never given to the pt until time of administration which is observed by the nurse. Disposed of per FDA/REMS regulations. All Spravato Treatments are documented in Spravato REMS per protocol of being a treatment center. Spravato is a CIII medication and has to be only given at a treatment facility and observed by nurse as pt administered intranasally   She was directed to the treatment room to get vitals taken first. Initial vital signs are B/P at 3:12 PM 136/87, 58, Pt instructed to blow her nose and to recline back at 45 degrees. Pt given first nasal spray (28 mg) administered by pt observed by nurse. There were 5 minutes between each dose, total of 84 mg. Tolerated well. Assessed pt's 40 minute vital signs at 4:00 PM 123/90, 62.  Pt met with [name] and they discussed her care at the end of her treatment when her thoughts are clearer and her medication and moods. She does go to the bathroom at least once during her treatment. No sedation and had slight feeling of being "high" she reports. Discharge vitals at 5:05 PM 124/89, 62. Pt stable for discharge.  Pt was observed on site a total of 120 minutes per FDA/REMS requirements. Pt was with nurse for clinical assessment 50 minutes. pt will contact nurse when able to return to treatment.    LOT [id number] APR 2027

## 2023-06-27 NOTE — Progress Notes (Signed)
Casey Diaz [id number] 02-Jun-1966 57 y.o.    Subjective:   Patient ID:  Casey Diaz is a 57 y.o. (DOB 10/17/1966) female.  Chief Complaint:  Chief Complaint  Patient presents with   Follow-up   Depression   Anxiety   Stress     HPI Casey Diaz presents to the office today for follow-up of OCD and severe anxiety.     December 2019 visit the following was noted: No meds were changed. Lives in [name] and back for followup.  Sx are about the same.  Has to take meds with different sizes. Pt reports that mood is Anxious and Depressed and describes anxiety as Severe. Anxiety symptoms include: Excessive Worry, Obsessive Compulsive Symptoms:   Checking,,. Pt reports has interrupted sleep and nocturia. Pt reports that appetite is good. Pt reports that energy is no change and down slightly. Concentration is down slightly. Suicidal thoughts:  denied by patient. Loves the environment of [name] but misses some things there.  She's not able to work there.  H works there and likes it.  Struggled with not working, feels isolated and not up to task of meeting people.  Does attend a church and met a friend who's been helpful.  Leaving for [name] on 10/16/18.   04/09/2020 appointment the following is noted:  Staying another year in [name] bc Covid and other things. Last few months a lot of crying spells.  Is in menopause. Wonders about med changes though is nervous about it.  Crying spells associated with depressing thoughts more than stress or OCD.   Covid really hard on everyone and couldn't see family for 18 mos.  Family still very dysfunctional. No close friends in part due to OCD and depression. Son high Autism spectrum with ADHD and anxiety and she's with him all the time. Greater health problems with CP so more pains.   05/15/20 appt with the following noted: Casey Diaz for menopause and helps some. Still depressed.  Chronically. In [place] for 2 more weeks then to [name] for  another year. A lot of stressors lately triggering more checking and anxiety.   OCD is her CC now and seems.  Got worse DT stress.   Stressed with Asberger's son and her health.  H works a lot.  Her FOO still stress. Plan: Trintellix 10 mg 1 tablet in the morning with food and reduce fluvoxamine to 5 tablets nightly for 1 week  then reduce it to 4 tablets nightly.   07/02/20 appt with the following noted: Decided not to get Trintellix bc difficulty getting it. It is available.  There.  Wants to start it now.   Both depression and OCD are severe.  Not suicidal in intent or plan. Did not take samples with her to [name] but will be back in December. covid is worse there and travel is difficult.  Wants to reduce Wellbutrin DT dry mouth. Plan: She's afraid to reduce Luvox at this time DT fear of worsening OCD.  But will consider. Trintellix 10 mg 1 tablet in the morning with food and reduce fluvoxamine to 5 tablets nightly for 1 week  then reduce it to 4 tablets nightly. Also reduce Wellbutrin XL to 300 mg daily.    9-13 2022 appointment with the following noted: Back in [place] since July 14.  Broke arm a month ago and surgery.  It's all been rough adjustment.   B has cancer on his face and M fell taking him to the doctor.  Misses the water and weather of [name].   Cry a lot more since menopause. Still depression and anxiety and OCD.  Asks about ketamine. On Wellbutrin 300, Luvox 300.  No Trintellix. Added Ativan 2 mg AM and HS and it helps.  More likely to get upset at night. Plan: Increase Luvox back to 400 mg daily.  She thinks she's worse on less. Continue Wellbutrin XL to 300 mg daily. 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  She tolerated it well without unusual nausea or vomiting headache or other somatic symptoms.  She did have the expected dissociation which gradually resolved over  the course of the 2-hour period of observation.  She was a little concerned about her balance given her cerebral palsy but has not noted unusual or unexpected problems.  She is motivated to can continue Spravato in hopes of reducing her depressive symptoms. 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  She also has treatment resistant OCD which is partially managed with medications but is still quite disabling.  She is tolerating the medications well.  She is sleeping adequately.  Her appetite is adequate.  She is not having suicidal thoughts.  She continues to wish for a better treatment for OCD that would give her some relief.  09/30/2021 appointment with the following noted: She received her first dose of Spravato 84 mg intranasally today.  She tolerated it well without unusual nausea, vomiting, or other somatic symptoms.  Dissociation as expected did occur and gradually resolved over the 2-hour period of observation.  She did have a mild headache today with the treatment and received ibuprofen 600 mg at her request.  We will follow this to see if it is a pattern Patient is still depressed.  She said she was late with her medicine today and today was a particularly depressing day.  However she notes that the Spravato has lifted her mood considerably even today.  She is hopeful that it will continue to be helpful.  No suicidal thoughts.  She has ongoing chronic anxiety and OCD at baseline.  10/04/21 appt noted: Patient received Spravato 84 mg for the second time today.  She tolerated it well without any unusual headache, nausea or vomiting or other somatic symptoms.  Dissociation did occur and she gradually [place] resolution over the 2-hour period of observation. She did not have any unusual problems after she left the office last Spravato administration.  She did not have any specific problems with balance or walking.  She is at increased risk of that difficulty because of  cerebral palsy.  So far she has not noticed much mood effect from the medication beyond the first day of receiving it.  However she would like to continue Spravato in hopes of getting the antidepressant effect that is desired. Stress dealing with mother's behavior at party pt hosted.  Guilt over it.  10/07/2021 appointment noted: Patient received Spravato 84 mg for the second time today.  She tolerated it well without any unusual headache, nausea or vomiting or other somatic symptoms.  Dissociation did occur and she gradually [place] resolution over the 2-hour period of observation. She still is not sure about the antidepressant effect of Spravato.  Events over the holidays and demands, make it difficult to assess.  She still notes that the OCD tends to worsen the depression and vice versa.  She tolerates the  Spravato well and wants to continue the trial.  10/15/2021 appointment with the following noted: Patient received Spravato 84 mg for the second time today.  She tolerated it well without any unusual headache, nausea or vomiting or other somatic symptoms.  Dissociation did occur and she gradually [place] resolution over the 2-hour period of observation. Patient says it was somewhat difficult to evaluate the effect of the Spravato.  It was scheduled to be twice weekly for 4 weeks consecutively but the holidays have interfered with that administration.  She asked what specifically should be she should be looking for in order to assess improvement.  That was discussed.  The OCD is unchanged and the depression so far is not significantly different.  She still tolerates meds.  There have been no recent med changes  10/19/2021 appt noted: Patient received Spravato 84 mg for the second today.  She tolerated it well without any unusual headache, nausea or vomiting or other somatic symptoms.  Dissociation did occur and she gradually saw resolution over the 2-hour period of observation.   10/21/2021 appointment  noted: Patient received Spravato 84 mg today.  She tolerated it well without any unusual headache, nausea or vomiting or other somatic symptoms.  Dissociation did occur and she gradually saw resolution over the 2-hour period of observation.  She feels better than last week.  She is not as depressed and down.  She is still dealing with grief around the death of her cousin that was unexpected.  It is still difficult to tell how much the Spravato was doing but she is hopeful.  Anxiety is still present with the OCD.  She is not having suicidal thoughts.  She is not hopeless.  She wants to continue treatment.  10/25/2021 appointment with the following noted: Patient received Spravato 84 mg today.  She tolerated it well without any unusual headache, nausea or vomiting or other somatic symptoms.  Dissociation did occur and she gradually saw resolution over the 2-hour period of observation.  She does not typically find the dissociation very strong. She is beginning to think the Spravato is helping somewhat with the depression.  It has been difficult to tell with the holidays intervening as well as the death of her cousin.  She has not been able to get Spravato twice weekly for 4 weeks straight as typically planned.  However she is hopeful.  The OCD remains significant.  She still has a tendency to think very negatively.  She is not suicidal.  10/28/2021 appointment with the following noted: Patient received Spravato 84 mg today.  She tolerated it well without any unusual headache, nausea or vomiting or other somatic symptoms.  Dissociation did occur and she gradually saw resolution over the 2-hour period of observation.  She does not typically find the dissociation very strong. She is feeling more hopeful about the administration of Spravato.  She is having less depression she believes.  Still not dramatically different.  She still has a tendency to have a lot of anxiety and rumination and OCD.  She is not suicidal.   She is eager to continue the Spravato.  11/01/2021 appointment with the following noted: Patient received Spravato 84 mg today.  She tolerated it well without any unusual headache, nausea or vomiting or other somatic symptoms.  Dissociation did occur and she gradually saw resolution over the 2-hour period of observation.  She does not typically find the dissociation very strong. She is continuing to see a little bit of improvement in depression  with Spravato.  The anxiety remains but may be not as severe.  The OCD remains markedly severe chronically.  She is not suicidal.  She is encouraged by the degree of improvement with Spravato and inability to enjoy things more and not be quite as ruminative.  11/04/2021 appt noted: Patient received Spravato 84 mg today.  She tolerated it well without any unusual headache, nausea or vomiting or other somatic symptoms.  Dissociation did occur and she gradually saw resolution over the 2-hour period of observation.  She does not typically find the dissociation very strong. No SE complaints with meds. She continues to feel hopeful about the Spravato.  She has less depression.  Because of a number of factors she is uncertain of the full benefit but thinks she is somewhat less depressed.  Her anxiety and OCD remain significant but a little better.  She is tolerating the medications and does not desire medicine change.  She is not currently complaining of insomnia.   11/08/2021 appointment the following noted: Patient received Spravato 84 mg today.  She tolerated it well without any unusual headache, nausea or vomiting or other somatic symptoms.  Dissociation did occur and she gradually saw resolution over the 2-hour period of observation.  She does not typically find the dissociation very strong. No SE complaints with meds. She feels the Spravato is helping somewhat.  She would like to see a greater effect.  However she is able to enjoy things.  She is productive at home.   She would like to see a lifting of a degree of sadness that remains.  The anxiety and OCD remained largely unchanged.  She wondered about the dosing of Wellbutrin 300 mg a day and Luvox 300 mg a day and possible increases.  She has been at higher doses in the past.  She plans to start water therapy for her weakness and for her shoulder.  11/11/2021 appointment with the following noted: Patient received Spravato 84 mg today.  She tolerated it well without any unusual headache, nausea or vomiting or other somatic symptoms.  Dissociation did occur and she gradually saw resolution over the 2-hour period of observation.  She does not typically find the dissociation very strong. No SE complaints with meds. She feels the Spravato is clearly helping the depression.  She would like to see a more significant effect.  She is still having trouble thinking positive. Her energy is fair.  Concentration is good except for the problem with chronic obsessions. She has been taking Wellbutrin 300 mg in Luvox 300 mg for quite some time but has taken higher doses in the past.  We discussed that.  She would like to try higher doses in order to get a better effect if possible. We just increased the doses a couple of days ago.  No effect yet.  11/15/2021 appointment with the following noted: Patient received Spravato 84 mg today.  She tolerated it well without any unusual headache, nausea or vomiting or other somatic symptoms.  Dissociation did occur and she gradually saw resolution over the 2-hour period of observation.  She does not typically find the dissociation very strong. No SE complaints with meds. The patient is now convinced that the Spravato is helping the depression.  She would like to continue twice weekly Spravato this week if possible.  She has tolerated the increase in Wellbutrin to 450 mg daily and the increase and fluvoxamine to 400 mg daily without complications thus far.  The OCD and anxiety feed  the  depression to some extent. She spends approximately 2 hours daily with checking compulsions due to obsessions about causing harm to others.  For example fearing that when she has hit a pot hole that she may have hit a person and going back to check.  Checking corners and rooms out of fear that she may have harmed someone.  Other various checking compulsions.  She is hoping the increase in fluvoxamine to 400 mg will reduce that over the weeks to come.  She is not seeing a significant difference with the addition of the Spravato though she understands that was not expected.  She is more productive at home and more motivated and able to enjoy things more fully as a result of the Spravato treatment.  She is tolerating the medication  11/18/2021 appointment with the following noted: Patient received Spravato 84 mg today.  She tolerated it well without any unusual headache, nausea or vomiting or other somatic symptoms.  Dissociation did occur and she gradually saw resolution over the 2-hour period of observation.  She does not typically find the dissociation very strong. No SE complaints with meds. She clearly believes the Spravato has been helpful for the depression.  She wonders whether to continue to treatments weekly or to cut back to 1 weekly.  She would like to continue twice weekly in hopes of getting additional improvement in the depression because it is not resolved but it is difficult to get here twice a week in terms of arranging rides. She is recently increased Wellbutrin XL to 450 mg daily and fluvoxamine to 400 mg daily but they have not had time to have an official effect.  She is tolerating that well.  She is tolerating meds overwork overall well. The OCD remains the same as noted on 11/15/2021  11/25/21 appt noted: Patient received Spravato 84 mg today.  She tolerated it well without any unusual headache, nausea or vomiting or other somatic symptoms.  Dissociation did occur and she gradually saw  resolution over the 2-hour period of observation.  She does not typically find the dissociation very strong. No SE complaints with meds. She thinks the increase in Wellbutrin and Luvox have been potentially helpful for depression and OCD respectively.  It has been too early to see the full effect.  She is sleeping and eating well.  She is functioning at home.  She still spends a lot of time that is about 2 hours a day dealing with compulsive behaviors.  12/02/21 appt noted: Patient received Spravato 84 mg today.  She tolerated it well without any unusual headache, nausea or vomiting or other somatic symptoms.  Dissociation did occur and she gradually saw resolution over the 2-hour period of observation.  She does not typically find the dissociation very strong. No SE complaints with meds. Several losses and stressors recently that affect her sense of mood. However still sees significant benefit from the Spravato for her depression.  Wants to continue it. Suspect early  some benefit from the increased Wellbutrin for depression and Luvox for OCD. Tolerating meds. No complaints about the meds. Sleeping and eating well.  No new health concerns.  12/09/21 appt noted: Patient received Spravato 84 mg today.  She tolerated it well without any unusual headache, nausea or vomiting or other somatic symptoms.  Dissociation did occur and she gradually saw resolution over the 2-hour period of observation.  She does not typically find the dissociation very strong. No SE complaints with meds. Seeing noticeable improvement from  increase fluvoxamine to 400 mg daily.  Tolerating meds without concerns over them. Depression is stable with residual sx of easy guilt and easily stressed.  OCD contributes to depression but depression is not severe with less crying spells.  Productive at home with chores.  Enjoyed recent birthday.  Sleeping good. No new concerns.  12/23/2021 appointment noted: Patient received Spravato 84 mg  today.  She tolerated it well without any unusual headache, nausea or vomiting or other somatic symptoms.  Dissociation did occur and she gradually saw resolution over the 2-hour period of observation.  She does not typically find the dissociation very strong. No SE complaints with meds. Seeing noticeable improvement from increase fluvoxamine to 400 mg daily.  Tolerating meds without concerns over them. Her depression is somewhat improved with the Spravato.  She also feels generally a little lighter.  She is more motivated.  She is less overwhelmed by guilt.  The OCD is gradually improving but is still quite time-consuming as noted before.  She is sleeping well.  No side effects  12/30/2021 appointment with the following noted: Patient received Spravato 84 mg today.  She tolerated it well without any unusual headache, nausea or vomiting or other somatic symptoms.  Dissociation did occur and she gradually saw resolution over the 2-hour period of observation.  She does not typically find the dissociation very strong. No SE complaints with meds. Seeing noticeable improvement from increase fluvoxamine to 400 mg daily.  Tolerating meds without concerns over them. She is confident of her the improvement seen with Spravato.  She is less hopeless.  Guilt is marked remarkably improved.  She is not having any thoughts of death or dying.  She is more motivated for activities such as exercise which she is recently started.  She is sleeping well. The OCD remains severe but it is improving somewhat with the increase in fluvoxamine.  It is still consuming a couple hours per day.  01/10/22 apravato 84 admin  01/24/22 appt noted: Patient received Spravato 84 mg today.  She tolerated it well without any unusual headache, nausea or vomiting or other somatic symptoms.  Dissociation did occur and she gradually saw resolution over the 2-hour period of observation.  She does not typically find the dissociation very strong. No  SE complaints with meds. Very tearful today.  Feels like she has been suppressing emotion in the Spravato caused it to be released.  Discussed some stressors.  Overall still feels the medicine is helpful.  She has missed some of the scheduled Spravato treatments that were intended to be weekly due to circumstances beyond her control.  She is still struggling with OCD as previously noted but does believe the medications are helpful. Plan no med changes  01/31/2022 received Spravato 84 mg today  02/09/2022 appointment with the following noted: Patient received Spravato 84 mg today.  She tolerated it well without any unusual headache, nausea or vomiting or other somatic symptoms.  Dissociation did occur and she gradually saw resolution over the 2-hour period of observation.  She does not typically find the dissociation very strong. No SE complaints with meds. Spravato clearly helps depression and OCD but easily gets overwhelmed and tearful with fairly routine stressors.  Tolerating meds. Sleep and appetite is OK Asks to increase lorazepam to 2 mg AM and HS and 1mg  afternoon  02/16/22 appt noted: Patient received Spravato 84 mg today.  She tolerated it well without any unusual headache, nausea or vomiting or other somatic symptoms.  Dissociation did  occur and she gradually saw resolution over the 2-hour period of observation.  She does not typically find the dissociation very strong. No SE complaints with meds. She has chronic depesssion and OCD but is improved with Spravato, both dx versus before.  She has continued Luvox 400 mg and Wellbutrin 450 mg and is tolerating it.  Chronically easily stressed.  Tolerating all meds.  Doesn't like taking more meds.  Spending a couple hours daily with OCD.  No SI No med changes.  02/21/22 appt noted:   Doesn't like taking more meds.  Spending a couple hours daily with OCD.  No SI No med changes.  02/21/22 appt noted: Patient received Spravato 84 mg today.  She  tolerated it well without any unusual headache, nausea or vomiting or other somatic symptoms.  Dissociation did occur and she gradually saw resolution over the 2-hour period of observation.  She does not typically find the dissociation very strong. No SE complaints with meds. She has chronic depesssion and OCD but is improved with Spravato, both dx versus before.  She has continued Luvox 400 mg and Wellbutrin 450 mg and is tolerating it.  Chronically easily overwhelmed and doesn't know why.  Tolerating all meds. Wants to continue meds.  03/16/22 appt noted: Patient received Spravato 84 mg today.  She tolerated it well without any unusual headache, nausea or vomiting or other somatic symptoms.  Dissociation did occur and she gradually saw resolution over the 2-hour period of observation.  She does not typically find the dissociation very strong. No SE complaints with meds. Overall she still feels the Spravato has been helpful not only for her depression but also for her OCD which was somewhat unexpected.  OCD is still significant but it is less severe than prior to starting Spravato.  She is tolerating Luvox 400 mg and Wellbutrin 450 mg.  We discussed possible med adjustments.  03/23/22 appt noted: Patient received Spravato 84 mg today.  She tolerated it well without any unusual headache, nausea or vomiting or other somatic symptoms.  Dissociation did occur and she gradually saw resolution over the 2-hour period of observation.  She does not typically find the dissociation very strong. No SE complaints with meds. She is still depressed and still has OCD of course but is improved with the Spravato.  She is tolerating the medications well.  We had previously discussed the possibility of switching some of the Wellbutrin to [facility] and she is very interested in that in hopes of further improvement in depression and OCD.  She understands that Auvelity is not used for OCD on the label.  She is tolerating the  medications.  She is still easily overwhelmed.  She is sleeping and eating okay.. Plan: Reduce Wellbutrin XL to 300 mg AM and add Auvelity 1 tablet each AM  03/30/22 appt noted: Patient received Spravato 84 mg today.  She tolerated it well without any unusual headache, nausea or vomiting or other somatic symptoms.  Dissociation did occur and she gradually saw resolution over the 2-hour period of observation.  She does not typically find the dissociation very strong. No SE complaints with meds. She is still depressed and still has OCD of course but is improved with the Spravato.  She is tolerating the medications well.  No difference with Auvelity 1 AM so far and no SE.  Going on vacation on Saturday. 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  Diagnosis Date   Abnormal Pap smear 2011   hpv/mild dysplasia,cin1   Anxiety    Cerebral palsy (HCC)    right arm/leg   Cystocele    Depression    Headache    Neuromuscular disorder (HCC)    Cerebral Palsy   OCD (obsessive compulsive disorder)    Osteoporosis    Uterine  prolaps     Family History  Problem Relation Age of Onset   Cancer Father        skin AND LUNG   Alcohol abuse Sister        CRACK COCAINE    Social History   Socioeconomic History   Marital status: Married    Spouse name: Not on file   Number of children: Not on file   Years of education: Not on file   Highest education level: Not on file  Occupational History   Not on file  Tobacco Use   Smoking status: Never   Smokeless tobacco: Never  Substance and Sexual Activity   Alcohol use: Not Currently    Comment: OCCASIONAL beer   Drug use: No   Sexual activity: Yes    Birth control/protection: Pill    Comment: LOESTRIN 24 FE  Other Topics Concern   Not on file  Social History Narrative   Not on file   Social Determinants of Health   Financial Resource Strain: Not on file  Food Insecurity: Not on file  Transportation Needs: Not on file  Physical Activity: Not on file  Stress: Not on file  Social Connections: Not on file  Intimate Partner Violence: Not on file    Past Medical History, Surgical history, Social history, and Family history were reviewed and updated as appropriate.   Please see review of systems for further details on the patient's review from today.   Objective:   Physical Exam:  LMP  (LMP Unknown)   Physical Exam Constitutional:      General: She is not in acute distress. Neurological:     Mental Status: She is alert and oriented to person, place, and time.     Cranial Nerves: No dysarthria.     Motor: Weakness present.     Gait: Gait abnormal.  Psychiatric:        Attention and Perception: Attention and perception normal.        Mood and Affect: Mood is anxious and depressed. Affect is not tearful.        Speech: Speech normal.        Behavior: Behavior normal. Behavior is cooperative.  Thought Content: Thought content normal. Thought content is not delusional. Thought content does not include homicidal or suicidal ideation. Thought  content does not include suicidal plan.        Cognition and Memory: Cognition and memory normal. Cognition is not impaired.        Judgment: Judgment normal.     Comments: Insight intact Ongoing OCD remains fairly severe but less anxious Checking compulsions up to 2 hours daily but improved noticeably with Spravato Chronic depression persistent but better with Spravato  and helps OCD too. Tearful initially resolved        Lab Review:     Component Value Date/Time   NA 138 06/11/2021 0606   K 4.0 06/11/2021 0606   CL 107 06/11/2021 0606   CO2 26 06/11/2021 0606   GLUCOSE 90 06/11/2021 0606   BUN 18 06/11/2021 0606   CREATININE 0.81 06/11/2021 0606   CALCIUM 9.4 06/11/2021 0606   PROT 6.5 06/11/2021 0606   ALBUMIN 3.3 (L) 06/11/2021 0606   AST 17 06/11/2021 0606   ALT 14 06/11/2021 0606   ALKPHOS 141 (H) 06/11/2021 0606   BILITOT 0.2 (L) 06/11/2021 0606   GFRNONAA >60 06/11/2021 0606   GFRAA >60 07/09/2016 0438       Component Value Date/Time   WBC 5.8 06/11/2021 0606   RBC 4.12 06/11/2021 0606   HGB 12.5 06/11/2021 0606   HCT 39.7 06/11/2021 0606   PLT 299 06/11/2021 0606   MCV 96.4 06/11/2021 0606   MCH 30.3 06/11/2021 0606   MCHC 31.5 06/11/2021 0606   RDW 13.9 06/11/2021 0606   LYMPHSABS 1.9 06/11/2021 0606   MONOABS 0.5 06/11/2021 0606   EOSABS 0.1 06/11/2021 0606   BASOSABS 0.0 06/11/2021 0606    No results found for: "POCLITH", "LITHIUM"   No results found for: "PHENYTOIN", "PHENOBARB", "VALPROATE", "CBMZ"   .res Assessment: Plan:    Keyasha "[name]" was seen today for follow-up, depression, anxiety and stress.  Diagnoses and all orders for this visit:  Recurrent major depression resistant to treatment [facility])  Mixed obsessional thoughts and acts  Social anxiety disorder  Migraine without aura and without status migrainosus, not intractable  Insomnia due to mental condition     Both primary Dx of OCD and major depression are TR and marked.   Impaired function but less so with Spravato re: depression..   She is receiving Spravato 84 mg weekly and moderate improvement in the depression..  she feels it also helps OCD somewhat.  However still easily overwhelmed with low stress tolerance.  Family contributes to her anxiety and stress markedly.  The OCD is improved with the increase in fluvoxamine and with Spravato.  Spends up to 2 hours daily and checking compulsions on her worst days but better when she travels.  No new options for tx are evident.  She has been on higher doses of fluvoxamine above the usual max of 400 mg daily in the past and finds it more helpful at the higher dose.    Disc SE.   She is tolerating the meds well  Continue  Luvox back to 400 mg nightly as of January 2023. Disc dosing higher than usual.  She feels this is increase has helped more with OCD which remains chronically severe.   Option increase Auvelity BID and reduce Wellbutrin to 1 AM. But she didn't think it made a lot more difference than 1 Auveltiy daily when taken in summer 2023. Consider trying to increase it again.  We have discussed seizure risk that is possible using this combination but given severity of her symptoms she feels the risk is warranted. There are few other alternative medication options that remain.   Disc DDI issues.   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 She doesn't want to try this now.  Disc Spravato DT TRD incl details and SE. Disc dosing and duration.  Pt with severe depression MADRS 36 on 06/29/21  Patient was administered Spravato 84 mg intranasally dosage today.  The patient experienced the typical dissociation which gradually resolved over the 2-hour period of observation.  There were no complications.  Specifically the patient did not have nausea or vomiting or headache.  Blood pressures remained within normal ranges at the 40-minute and 2-hour follow-up intervals.  By the time the 2-hour  observation period was met the patient was alert and oriented and able to exit without assistance. She tends to have lingering sedative effects but not severe. .  See nursing note for further details.  Didn't feel as well when off spravato but disc family issurs interfering with frequency.  We discussed the short-term risks associated with benzodiazepines including sedation and increased fall risk among others.  Discussed long-term side effect risk including dependence, potential withdrawal symptoms, and the potential eventual dose-related risk of dementia.  But recent studies from 2020 dispute this association between benzodiazepines and dementia risk. Newer studies in 2020 do not support an association with dementia. Disc this is high dose and not ideal.  Also disc risk combining it with temazepam. Rec try gradually reduce HS lorazepam to 1 mg Hs.  Can continue lorazepam 2 mg AM and 1 mg in afternoon bc of chronic anxiety and it is helpful and tolerated. She can continue temazepam 30 mg nightly.  She tends to have a lot of anxious negative thoughts at night when she is trying to go to bed.  She is trying to reduce the dose.  Complaining of HA and history migraine.  Asks for increase imitrex and disc preventatives like propranolol ER imitrex to 100 mg prn migraine and propranolol ER for migraine prevention.  Supportive therapy dealing with some of the recent stressors including son's autism and recent issues of rebelliousness with him.    Plan no change indicated:   she agrees.  No obvious changes. increase Auvelity 1 in AM & PM Hold Wellbutrin XL 150 mg BID Fluvoxamine 100 mg AM and 300 mg PM above usual max bc med necessary Lorazepam 2 mg HS and prn 1 mg BID prn anxiety daily Temazepam 30 mg HS Propranolol ER 60 mg daily Imitrex prn migraine  Follow-up every week if possible and if note every other week and try to be consistent. That is her plan but family demands do interfere at times.      Meredith Staggers, MD, DFAPA  Please see After Visit Summary for patient specific instructions.  Future Appointments  Date Time Provider Department Center  07/18/2023 11:00 AM Robley Fries, PhD [facility] None  08/01/2023 11:00 AM Robley Fries, PhD [facility] None  08/16/2023  6:00 PM Robley Fries, PhD [facility] None  08/30/2023  6:00 PM Robley Fries, PhD [facility] None  09/20/2023  6:00 PM Mitchum, Molly Maduro, PhD [facility] None    No orders of the defined types were placed in this encounter.    -------------------------------

## 2023-06-28 ENCOUNTER — Other Ambulatory Visit: Payer: Self-pay | Admitting: Psychiatry

## 2023-07-03 ENCOUNTER — Ambulatory Visit (HOSPITAL_BASED_OUTPATIENT_CLINIC_OR_DEPARTMENT_OTHER): Payer: 59 | Admitting: Student

## 2023-07-04 ENCOUNTER — Ambulatory Visit: Payer: Self-pay | Admitting: Psychiatry

## 2023-07-10 ENCOUNTER — Other Ambulatory Visit: Payer: Self-pay | Admitting: Psychiatry

## 2023-07-11 ENCOUNTER — Ambulatory Visit: Payer: 59

## 2023-07-11 ENCOUNTER — Ambulatory Visit: Payer: 59 | Admitting: Psychiatry

## 2023-07-11 VITALS — BP 125/93 | HR 74

## 2023-07-11 DIAGNOSIS — F422 Mixed obsessional thoughts and acts: Secondary | ICD-10-CM

## 2023-07-11 DIAGNOSIS — F5105 Insomnia due to other mental disorder: Secondary | ICD-10-CM | POA: Diagnosis not present

## 2023-07-11 DIAGNOSIS — F4010 Social phobia, unspecified: Secondary | ICD-10-CM

## 2023-07-11 DIAGNOSIS — F339 Major depressive disorder, recurrent, unspecified: Secondary | ICD-10-CM

## 2023-07-11 DIAGNOSIS — G43009 Migraine without aura, not intractable, without status migrainosus: Secondary | ICD-10-CM

## 2023-07-11 NOTE — Progress Notes (Unsigned)
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 Pt is being treated for Treatment Resistant Depression, Pt taken to treatment room. Pt's medication is charged through [employer].  Medication is stored behind 2 locked doors, it is never given to the pt until time of administration which is observed by the nurse. Disposed of per FDA/REMS regulations. All Spravato Treatments are documented in Spravato REMS per protocol of being a treatment center. Spravato is a CIII medication and has to be only given at a treatment facility and observed by nurse as pt administered intranasally   She was directed to the treatment room to get vitals taken first. Initial vital signs are B/P at 3:00 PM 110/84, 74, Pt instructed to blow her nose and to recline back at 45 degrees. Pt given first nasal spray (28 mg) administered by pt observed by nurse. There were 5 minutes between each dose, total of 84 mg. Tolerated well. Assessed pt's 40 minute vital signs at 3:45 PM 123/90, 62.  Pt met with [name] and they discussed her care at the end of her treatment when her thoughts are clearer and her medication and moods. She does go to the bathroom at least once during her treatment. No sedation and had slight feeling of being "high" she reports. Discharge vitals at 5:05 PM 124/89, 62. Pt stable for discharge.  Pt was observed on site a total of 120 minutes per FDA/REMS requirements. Pt was with nurse for clinical assessment 50 minutes. pt will contact nurse when able to return to treatment.    LOT [id number] EXP APR 2027

## 2023-07-12 ENCOUNTER — Other Ambulatory Visit: Payer: Self-pay | Admitting: Psychiatry

## 2023-07-12 ENCOUNTER — Encounter: Payer: Self-pay | Admitting: Psychiatry

## 2023-07-12 DIAGNOSIS — G43009 Migraine without aura, not intractable, without status migrainosus: Secondary | ICD-10-CM

## 2023-07-12 NOTE — Progress Notes (Signed)
Casey Diaz [id number] Jan 17, 1966 57 y.o.    Subjective:   Patient ID:  Casey Diaz is a 57 y.o. (DOB 1966-08-25) female.  Chief Complaint:  Chief Complaint  Patient presents with   Follow-up   Depression   Anxiety   Stress     HPI Casey Diaz presents to the office today for follow-up of OCD and severe anxiety.     December 2019 visit the following was noted: No meds were changed. Lives in [name] and back for followup.  Sx are about the same.  Has to take meds with different sizes. Pt reports that mood is Anxious and Depressed and describes anxiety as Severe. Anxiety symptoms include: Excessive Worry, Obsessive Compulsive Symptoms:   Checking,,. Pt reports has interrupted sleep and nocturia. Pt reports that appetite is good. Pt reports that energy is no change and down slightly. Concentration is down slightly. Suicidal thoughts:  denied by patient. Loves the environment of [name] but misses some things there.  She's not able to work there.  H works there and likes it.  Struggled with not working, feels isolated and not up to task of meeting people.  Does attend a church and met a friend who's been helpful.  Leaving for [name] on 10/16/18.   04/09/2020 appointment the following is noted:  Staying another year in [name] bc Covid and other things. Last few months a lot of crying spells.  Is in menopause. Wonders about med changes though is nervous about it.  Crying spells associated with depressing thoughts more than stress or OCD.   Covid really hard on everyone and couldn't see family for 18 mos.  Family still very dysfunctional. No close friends in part due to OCD and depression. Son high Autism spectrum with ADHD and anxiety and she's with him all the time. Greater health problems with CP so more pains.   05/15/20 appt with the following noted: Casey Diaz for menopause and helps some. Still depressed.  Chronically. In [place] for 2 more weeks then to [name] for  another year. A lot of stressors lately triggering more checking and anxiety.   OCD is her CC now and seems.  Got worse DT stress.   Stressed with Asberger's son and her health.  H works a lot.  Her FOO still stress. Plan: Trintellix 10 mg 1 tablet in the morning with food and reduce fluvoxamine to 5 tablets nightly for 1 week  then reduce it to 4 tablets nightly.   07/02/20 appt with the following noted: Decided not to get Trintellix bc difficulty getting it. It is available.  There.  Wants to start it now.   Both depression and OCD are severe.  Not suicidal in intent or plan. Did not take samples with her to [name] but will be back in December. covid is worse there and travel is difficult.  Wants to reduce Wellbutrin DT dry mouth. Plan: She's afraid to reduce Luvox at this time DT fear of worsening OCD.  But will consider. Trintellix 10 mg 1 tablet in the morning with food and reduce fluvoxamine to 5 tablets nightly for 1 week  then reduce it to 4 tablets nightly. Also reduce Wellbutrin XL to 300 mg daily.    9-13 2022 appointment with the following noted: Back in [place] since July 14.  Broke arm a month ago and surgery.  It's all been rough adjustment.   B has cancer on his face and M fell taking him to the doctor.  Misses the water and weather of [name].   Cry a lot more since menopause. Still depression and anxiety and OCD.  Asks about ketamine. On Wellbutrin 300, Luvox 300.  No Trintellix. Added Ativan 2 mg AM and HS and it helps.  More likely to get upset at night. Plan: Increase Luvox back to 400 mg daily.  She thinks she's worse on less. Continue Wellbutrin XL to 300 mg daily. 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  She tolerated it well without unusual nausea or vomiting headache or other somatic symptoms.  She did have the expected dissociation which gradually resolved over  the course of the 2-hour period of observation.  She was a little concerned about her balance given her cerebral palsy but has not noted unusual or unexpected problems.  She is motivated to can continue Spravato in hopes of reducing her depressive symptoms. 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  She also has treatment resistant OCD which is partially managed with medications but is still quite disabling.  She is tolerating the medications well.  She is sleeping adequately.  Her appetite is adequate.  She is not having suicidal thoughts.  She continues to wish for a better treatment for OCD that would give her some relief.  09/30/2021 appointment with the following noted: She received her first dose of Spravato 84 mg intranasally today.  She tolerated it well without unusual nausea, vomiting, or other somatic symptoms.  Dissociation as expected did occur and gradually resolved over the 2-hour period of observation.  She did have a mild headache today with the treatment and received ibuprofen 600 mg at her request.  We will follow this to see if it is a pattern Patient is still depressed.  She said she was late with her medicine today and today was a particularly depressing day.  However she notes that the Spravato has lifted her mood considerably even today.  She is hopeful that it will continue to be helpful.  No suicidal thoughts.  She has ongoing chronic anxiety and OCD at baseline.  10/04/21 appt noted: Patient received Spravato 84 mg for the second time today.  She tolerated it well without any unusual headache, nausea or vomiting or other somatic symptoms.  Dissociation did occur and she gradually [place] resolution over the 2-hour period of observation. She did not have any unusual problems after she left the office last Spravato administration.  She did not have any specific problems with balance or walking.  She is at increased risk of that difficulty because of  cerebral palsy.  So far she has not noticed much mood effect from the medication beyond the first day of receiving it.  However she would like to continue Spravato in hopes of getting the antidepressant effect that is desired. Stress dealing with mother's behavior at party pt hosted.  Guilt over it.  10/07/2021 appointment noted: Patient received Spravato 84 mg for the second time today.  She tolerated it well without any unusual headache, nausea or vomiting or other somatic symptoms.  Dissociation did occur and she gradually [place] resolution over the 2-hour period of observation. She still is not sure about the antidepressant effect of Spravato.  Events over the holidays and demands, make it difficult to assess.  She still notes that the OCD tends to worsen the depression and vice versa.  She tolerates the  Spravato well and wants to continue the trial.  10/15/2021 appointment with the following noted: Patient received Spravato 84 mg for the second time today.  She tolerated it well without any unusual headache, nausea or vomiting or other somatic symptoms.  Dissociation did occur and she gradually [place] resolution over the 2-hour period of observation. Patient says it was somewhat difficult to evaluate the effect of the Spravato.  It was scheduled to be twice weekly for 4 weeks consecutively but the holidays have interfered with that administration.  She asked what specifically should be she should be looking for in order to assess improvement.  That was discussed.  The OCD is unchanged and the depression so far is not significantly different.  She still tolerates meds.  There have been no recent med changes  10/19/2021 appt noted: Patient received Spravato 84 mg for the second today.  She tolerated it well without any unusual headache, nausea or vomiting or other somatic symptoms.  Dissociation did occur and she gradually saw resolution over the 2-hour period of observation.   10/21/2021 appointment  noted: Patient received Spravato 84 mg today.  She tolerated it well without any unusual headache, nausea or vomiting or other somatic symptoms.  Dissociation did occur and she gradually saw resolution over the 2-hour period of observation.  She feels better than last week.  She is not as depressed and down.  She is still dealing with grief around the death of her cousin that was unexpected.  It is still difficult to tell how much the Spravato was doing but she is hopeful.  Anxiety is still present with the OCD.  She is not having suicidal thoughts.  She is not hopeless.  She wants to continue treatment.  10/25/2021 appointment with the following noted: Patient received Spravato 84 mg today.  She tolerated it well without any unusual headache, nausea or vomiting or other somatic symptoms.  Dissociation did occur and she gradually saw resolution over the 2-hour period of observation.  She does not typically find the dissociation very strong. She is beginning to think the Spravato is helping somewhat with the depression.  It has been difficult to tell with the holidays intervening as well as the death of her cousin.  She has not been able to get Spravato twice weekly for 4 weeks straight as typically planned.  However she is hopeful.  The OCD remains significant.  She still has a tendency to think very negatively.  She is not suicidal.  10/28/2021 appointment with the following noted: Patient received Spravato 84 mg today.  She tolerated it well without any unusual headache, nausea or vomiting or other somatic symptoms.  Dissociation did occur and she gradually saw resolution over the 2-hour period of observation.  She does not typically find the dissociation very strong. She is feeling more hopeful about the administration of Spravato.  She is having less depression she believes.  Still not dramatically different.  She still has a tendency to have a lot of anxiety and rumination and OCD.  She is not suicidal.   She is eager to continue the Spravato.  11/01/2021 appointment with the following noted: Patient received Spravato 84 mg today.  She tolerated it well without any unusual headache, nausea or vomiting or other somatic symptoms.  Dissociation did occur and she gradually saw resolution over the 2-hour period of observation.  She does not typically find the dissociation very strong. She is continuing to see a little bit of improvement in depression  with Spravato.  The anxiety remains but may be not as severe.  The OCD remains markedly severe chronically.  She is not suicidal.  She is encouraged by the degree of improvement with Spravato and inability to enjoy things more and not be quite as ruminative.  11/04/2021 appt noted: Patient received Spravato 84 mg today.  She tolerated it well without any unusual headache, nausea or vomiting or other somatic symptoms.  Dissociation did occur and she gradually saw resolution over the 2-hour period of observation.  She does not typically find the dissociation very strong. No SE complaints with meds. She continues to feel hopeful about the Spravato.  She has less depression.  Because of a number of factors she is uncertain of the full benefit but thinks she is somewhat less depressed.  Her anxiety and OCD remain significant but a little better.  She is tolerating the medications and does not desire medicine change.  She is not currently complaining of insomnia.   11/08/2021 appointment the following noted: Patient received Spravato 84 mg today.  She tolerated it well without any unusual headache, nausea or vomiting or other somatic symptoms.  Dissociation did occur and she gradually saw resolution over the 2-hour period of observation.  She does not typically find the dissociation very strong. No SE complaints with meds. She feels the Spravato is helping somewhat.  She would like to see a greater effect.  However she is able to enjoy things.  She is productive at home.   She would like to see a lifting of a degree of sadness that remains.  The anxiety and OCD remained largely unchanged.  She wondered about the dosing of Wellbutrin 300 mg a day and Luvox 300 mg a day and possible increases.  She has been at higher doses in the past.  She plans to start water therapy for her weakness and for her shoulder.  11/11/2021 appointment with the following noted: Patient received Spravato 84 mg today.  She tolerated it well without any unusual headache, nausea or vomiting or other somatic symptoms.  Dissociation did occur and she gradually saw resolution over the 2-hour period of observation.  She does not typically find the dissociation very strong. No SE complaints with meds. She feels the Spravato is clearly helping the depression.  She would like to see a more significant effect.  She is still having trouble thinking positive. Her energy is fair.  Concentration is good except for the problem with chronic obsessions. She has been taking Wellbutrin 300 mg in Luvox 300 mg for quite some time but has taken higher doses in the past.  We discussed that.  She would like to try higher doses in order to get a better effect if possible. We just increased the doses a couple of days ago.  No effect yet.  11/15/2021 appointment with the following noted: Patient received Spravato 84 mg today.  She tolerated it well without any unusual headache, nausea or vomiting or other somatic symptoms.  Dissociation did occur and she gradually saw resolution over the 2-hour period of observation.  She does not typically find the dissociation very strong. No SE complaints with meds. The patient is now convinced that the Spravato is helping the depression.  She would like to continue twice weekly Spravato this week if possible.  She has tolerated the increase in Wellbutrin to 450 mg daily and the increase and fluvoxamine to 400 mg daily without complications thus far.  The OCD and anxiety feed  the  depression to some extent. She spends approximately 2 hours daily with checking compulsions due to obsessions about causing harm to others.  For example fearing that when she has hit a pot hole that she may have hit a person and going back to check.  Checking corners and rooms out of fear that she may have harmed someone.  Other various checking compulsions.  She is hoping the increase in fluvoxamine to 400 mg will reduce that over the weeks to come.  She is not seeing a significant difference with the addition of the Spravato though she understands that was not expected.  She is more productive at home and more motivated and able to enjoy things more fully as a result of the Spravato treatment.  She is tolerating the medication  11/18/2021 appointment with the following noted: Patient received Spravato 84 mg today.  She tolerated it well without any unusual headache, nausea or vomiting or other somatic symptoms.  Dissociation did occur and she gradually saw resolution over the 2-hour period of observation.  She does not typically find the dissociation very strong. No SE complaints with meds. She clearly believes the Spravato has been helpful for the depression.  She wonders whether to continue to treatments weekly or to cut back to 1 weekly.  She would like to continue twice weekly in hopes of getting additional improvement in the depression because it is not resolved but it is difficult to get here twice a week in terms of arranging rides. She is recently increased Wellbutrin XL to 450 mg daily and fluvoxamine to 400 mg daily but they have not had time to have an official effect.  She is tolerating that well.  She is tolerating meds overwork overall well. The OCD remains the same as noted on 11/15/2021  11/25/21 appt noted: Patient received Spravato 84 mg today.  She tolerated it well without any unusual headache, nausea or vomiting or other somatic symptoms.  Dissociation did occur and she gradually saw  resolution over the 2-hour period of observation.  She does not typically find the dissociation very strong. No SE complaints with meds. She thinks the increase in Wellbutrin and Luvox have been potentially helpful for depression and OCD respectively.  It has been too early to see the full effect.  She is sleeping and eating well.  She is functioning at home.  She still spends a lot of time that is about 2 hours a day dealing with compulsive behaviors.  12/02/21 appt noted: Patient received Spravato 84 mg today.  She tolerated it well without any unusual headache, nausea or vomiting or other somatic symptoms.  Dissociation did occur and she gradually saw resolution over the 2-hour period of observation.  She does not typically find the dissociation very strong. No SE complaints with meds. Several losses and stressors recently that affect her sense of mood. However still sees significant benefit from the Spravato for her depression.  Wants to continue it. Suspect early  some benefit from the increased Wellbutrin for depression and Luvox for OCD. Tolerating meds. No complaints about the meds. Sleeping and eating well.  No new health concerns.  12/09/21 appt noted: Patient received Spravato 84 mg today.  She tolerated it well without any unusual headache, nausea or vomiting or other somatic symptoms.  Dissociation did occur and she gradually saw resolution over the 2-hour period of observation.  She does not typically find the dissociation very strong. No SE complaints with meds. Seeing noticeable improvement from  increase fluvoxamine to 400 mg daily.  Tolerating meds without concerns over them. Depression is stable with residual sx of easy guilt and easily stressed.  OCD contributes to depression but depression is not severe with less crying spells.  Productive at home with chores.  Enjoyed recent birthday.  Sleeping good. No new concerns.  12/23/2021 appointment noted: Patient received Spravato 84 mg  today.  She tolerated it well without any unusual headache, nausea or vomiting or other somatic symptoms.  Dissociation did occur and she gradually saw resolution over the 2-hour period of observation.  She does not typically find the dissociation very strong. No SE complaints with meds. Seeing noticeable improvement from increase fluvoxamine to 400 mg daily.  Tolerating meds without concerns over them. Her depression is somewhat improved with the Spravato.  She also feels generally a little lighter.  She is more motivated.  She is less overwhelmed by guilt.  The OCD is gradually improving but is still quite time-consuming as noted before.  She is sleeping well.  No side effects  12/30/2021 appointment with the following noted: Patient received Spravato 84 mg today.  She tolerated it well without any unusual headache, nausea or vomiting or other somatic symptoms.  Dissociation did occur and she gradually saw resolution over the 2-hour period of observation.  She does not typically find the dissociation very strong. No SE complaints with meds. Seeing noticeable improvement from increase fluvoxamine to 400 mg daily.  Tolerating meds without concerns over them. She is confident of her the improvement seen with Spravato.  She is less hopeless.  Guilt is marked remarkably improved.  She is not having any thoughts of death or dying.  She is more motivated for activities such as exercise which she is recently started.  She is sleeping well. The OCD remains severe but it is improving somewhat with the increase in fluvoxamine.  It is still consuming a couple hours per day.  01/10/22 apravato 84 admin  01/24/22 appt noted: Patient received Spravato 84 mg today.  She tolerated it well without any unusual headache, nausea or vomiting or other somatic symptoms.  Dissociation did occur and she gradually saw resolution over the 2-hour period of observation.  She does not typically find the dissociation very strong. No  SE complaints with meds. Very tearful today.  Feels like she has been suppressing emotion in the Spravato caused it to be released.  Discussed some stressors.  Overall still feels the medicine is helpful.  She has missed some of the scheduled Spravato treatments that were intended to be weekly due to circumstances beyond her control.  She is still struggling with OCD as previously noted but does believe the medications are helpful. Plan no med changes  01/31/2022 received Spravato 84 mg today  02/09/2022 appointment with the following noted: Patient received Spravato 84 mg today.  She tolerated it well without any unusual headache, nausea or vomiting or other somatic symptoms.  Dissociation did occur and she gradually saw resolution over the 2-hour period of observation.  She does not typically find the dissociation very strong. No SE complaints with meds. Spravato clearly helps depression and OCD but easily gets overwhelmed and tearful with fairly routine stressors.  Tolerating meds. Sleep and appetite is OK Asks to increase lorazepam to 2 mg AM and HS and 1mg  afternoon  02/16/22 appt noted: Patient received Spravato 84 mg today.  She tolerated it well without any unusual headache, nausea or vomiting or other somatic symptoms.  Dissociation did  occur and she gradually saw resolution over the 2-hour period of observation.  She does not typically find the dissociation very strong. No SE complaints with meds. She has chronic depesssion and OCD but is improved with Spravato, both dx versus before.  She has continued Luvox 400 mg and Wellbutrin 450 mg and is tolerating it.  Chronically easily stressed.  Tolerating all meds.  Doesn't like taking more meds.  Spending a couple hours daily with OCD.  No SI No med changes.  02/21/22 appt noted:   Doesn't like taking more meds.  Spending a couple hours daily with OCD.  No SI No med changes.  02/21/22 appt noted: Patient received Spravato 84 mg today.  She  tolerated it well without any unusual headache, nausea or vomiting or other somatic symptoms.  Dissociation did occur and she gradually saw resolution over the 2-hour period of observation.  She does not typically find the dissociation very strong. No SE complaints with meds. She has chronic depesssion and OCD but is improved with Spravato, both dx versus before.  She has continued Luvox 400 mg and Wellbutrin 450 mg and is tolerating it.  Chronically easily overwhelmed and doesn't know why.  Tolerating all meds. Wants to continue meds.  03/16/22 appt noted: Patient received Spravato 84 mg today.  She tolerated it well without any unusual headache, nausea or vomiting or other somatic symptoms.  Dissociation did occur and she gradually saw resolution over the 2-hour period of observation.  She does not typically find the dissociation very strong. No SE complaints with meds. Overall she still feels the Spravato has been helpful not only for her depression but also for her OCD which was somewhat unexpected.  OCD is still significant but it is less severe than prior to starting Spravato.  She is tolerating Luvox 400 mg and Wellbutrin 450 mg.  We discussed possible med adjustments.  03/23/22 appt noted: Patient received Spravato 84 mg today.  She tolerated it well without any unusual headache, nausea or vomiting or other somatic symptoms.  Dissociation did occur and she gradually saw resolution over the 2-hour period of observation.  She does not typically find the dissociation very strong. No SE complaints with meds. She is still depressed and still has OCD of course but is improved with the Spravato.  She is tolerating the medications well.  We had previously discussed the possibility of switching some of the Wellbutrin to [facility] and she is very interested in that in hopes of further improvement in depression and OCD.  She understands that Auvelity is not used for OCD on the label.  She is tolerating the  medications.  She is still easily overwhelmed.  She is sleeping and eating okay.. Plan: Reduce Wellbutrin XL to 300 mg AM and add Auvelity 1 tablet each AM  03/30/22 appt noted: Patient received Spravato 84 mg today.  She tolerated it well without any unusual headache, nausea or vomiting or other somatic symptoms.  Dissociation did occur and she gradually saw resolution over the 2-hour period of observation.  She does not typically find the dissociation very strong. No SE complaints with meds. She is still depressed and still has OCD of course but is improved with the Spravato.  She is tolerating the medications well.  No difference with Auvelity 1 AM so far and no SE.  Going on vacation on Saturday. 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 Sleep and appetite good. Plan: Increase Auvelity to 1 twice daily and reduce Wellbutrin  to XL 150 every morning  04/14/2022 appointment with the following noted: Patient received Spravato 84 mg today.  She tolerated it well without any unusual headache, nausea or vomiting or other somatic symptoms.  Dissociation did occur and she gradually saw resolution over the 2-hour period of observation.  She does not typically find the dissociation very strong. No SE complaints with meds. She is still depressed and still has OCD of course but is improved with the Spravato.  She is tolerating the medications well.  Just increased Auvelity to BID yesterday and reduced Wellbutrin to 150 AM. No SE so far.  No change in mood or anxiety so far.  Chronic OCD as noted and residual depression and chronic fatigue.  04/21/2022 appointment with the following noted: Patient received Spravato 84 mg today.  She tolerated it well without any unusual headache, nausea or vomiting or other somatic symptoms.  Dissociation did occur and she gradually saw resolution over the 2-hour period of observation.  She does not typically find the dissociation very strong. No SE complaints with  meds. She is still depressed and still has OCD of course but is improved with the Spravato.   She has questions about the dosing of lorazepam. She tends to have negative anxious thoughts at night.  This tends to interfere with her ability to go to sleep.  She is getting about 8 to 9 hours of sleep.  She is tolerating the meds without excessive sedation and does not nap during the day.  05/06/22 appt noted: Patient received Spravato 84 mg today.  She tolerated it well without any unusual headache, nausea or vomiting or other somatic symptoms.  Dissociation did occur and she gradually saw resolution over the 2-hour period of observation.  She does not typically find the dissociation very strong. No SE complaints with meds. She is still depressed and still has OCD of course but is improved with the Spravato.   Had some questions about timing of dosing of fluvoxamine and Auvelity. OCD is not quite as time consuming.  Sleep and eating are the same.   No SE meds.  05/25/22 appt noted: Patient received Spravato 84 mg today.  She tolerated it well without any unusual headache, nausea or vomiting or other somatic symptoms.  Dissociation did occur and she gradually saw resolution over the 2-hour period of observation.  She does not typically find the dissociation very strong. No SE complaints with meds. She is still depressed and still has OCD of course but is improved with the Spravato.   She has less OCD when away from home and on vacation of note. Plan: Rec gradually reduce HS lorazepam to 1 mg Hs.  Can continue lorazepam 2 mg AM and 1 mg in afternoon bc of chronic anxiety and it is helpful and tolerated. She can continue temazepam 30 mg nightly.  She tends to have a lot of anxious negative thoughts at night when she is trying to go to bed  06/16/22 appt noted: Patient received Spravato 84 mg today.  She tolerated it well without any unusual headache, nausea or vomiting or other somatic symptoms.   Dissociation did occur and she gradually saw resolution over the 2-hour period of observation.  She does not typically find the dissociation very strong. No SE complaints with meds. She is still depressed and still has OCD of course but is improved with the Spravato.   She has less OCD when away from home and on vacation of note. She is tolerating  the medications.  She has continued current medications. 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  She still receives benefit from [facility].  Her depression and OCD are better with the Spravato.  She thinks she is getting additional benefit with the switch from Wellbutrin to [place].  07/04/2022 appointment noted: Reports she developed a rash on her face from [facility] and feels like she is allergic to it.  She stopped it and went back to Wellbutrin 450 mg every morning.  The rash has cleared up.  She did not require any medical attention and did not have shortness of breath. Overall her depression and OCD are about the same as they have been.  She did not notice a substantial difference from the brief treatment with Auvelity but she understands she did not take a full course.  She is tolerating the current medicines well. Current meds fluvoxamine 400 mg daily, Wellbutrin XL 450 mg daily, lorazepam 1 to 2 mg in the morning and 1 to 2 mg at night and 1 mg in the afternoon, temazepam 30 mg nightly. She wants to continue the Spravato because she feels it has been helpful for both her depression and her racing OCD  07/18/22 appt noted: Patient received Spravato 84 mg today.  She tolerated it well without any unusual headache, nausea or vomiting or other somatic symptoms.   Dissociation did occur and she gradually saw resolution over the 2-hour period of observation.  She does not typically find the dissociation very strong. No SE complaints with meds. She is still depressed and still has OCD of course but is improved with the Spravato.  Rash better off Auvelity and back on Welllbutrin XL 450 mg AM, fluvoxamine 400 mg daily.  08/15/22 appt noted: Current psych meds: Wellbutrin XL 450 mg AM, fluvoxamine 100 mg in AM and 300 mg HS, lorazepam 1 mg 1-2 mg in the AM and HS and 1 tablet prn midday for anxiety, temazepam 30 mg HS Patient received Spravato 84 mg today.  She tolerated it well without any unusual headache, nausea or vomiting or other somatic symptoms.  Dissociation did occur and she gradually saw resolution over the 2-hour period of observation.  She does not typically find the dissociation very strong. No SE complaints with meds. She has a great deal of stress dealing with her family.  Disc brother's ongoing mania and difficulty getting him help and the stress he causes for the family. She wants to continue Spravato through this very stressful holdicay season and reevaluate the frequency after the New Year.  09/12/22 appt noted: Current psych meds: Wellbutrin XL 450 mg AM, fluvoxamine 100 mg in AM and 300 mg HS, lorazepam 1 mg 1-2 mg in the AM and HS and 1 tablet prn midday for anxiety, temazepam 30 mg HS Patient received Spravato 84 mg today.  She tolerated it well without any unusual headache, nausea or vomiting or other somatic symptoms.  Dissociation did occur and she gradually saw resolution over the 2-hour period of observation.  She does not typically find the dissociation very strong. No SE complaints with meds. She has a great deal of stress dealing with her family.  Disc brother's  ongoing mania and difficulty getting him help and the stress he causes for the family. She wants to continue Spravato through this very stressful holdicay season and  reevaluate the frequency after the New Year.  Chronically easily overwhelmed with family. Complaining of HA and history migraine.  Asks for increase imitrex and disc preventatives like propranolol ER  09/26/22 appt noted: Current psych meds: Wellbutrin XL 450 mg AM, fluvoxamine 100 mg in AM and 300 mg HS, lorazepam 1 mg 1-2 mg in the AM and HS and 1 tablet prn midday for anxiety, temazepam 30 mg HS Patient received Spravato 84 mg today.  She tolerated it well without any unusual headache, nausea or vomiting or other somatic symptoms.  Dissociation did occur and she gradually saw resolution over the 2-hour period of observation.  She does not typically find the dissociation very strong. No SE complaints with meds. She has a great deal of stress dealing with her family.  This is ongoing The holidays are much more stressful DT family problems.  She is noting OCD is much worse over the last couple of week.  Depression is better with Spravato. Needed higher dose meds for migraine.   10/03/22 appt noted: Current psych meds: Wellbutrin XL 450 mg AM, fluvoxamine 100 mg in AM and 300 mg HS, lorazepam 1 mg 1-2 mg in the AM and HS and 1 tablet prn midday for anxiety, temazepam 30 mg HS Patient received Spravato 84 mg today.  She tolerated it well without any unusual headache, nausea or vomiting or other somatic symptoms.  Dissociation did occur and she gradually saw resolution over the 2-hour period of observation.  She does not typically find the dissociation very strong. No SE complaints with meds. She has now realized that the rash she previously previously attributed to [facility] was not related.  She is interested may be retrying that after the holidays.  She is tolerating medications otherwise. The holidays remain chronically stressful to her due to family dynamic problems which cause her to consistently feel stuck.  Under more stress her OCD is worse.  She will have a tendency to have crying spells.  The  depression and OCD are still improved with Spravato as compared to before.  11/15/22 appt noted: Current psych meds: Wellbutrin XL 450 mg AM, fluvoxamine 100 mg in AM and 300 mg HS, lorazepam 1 mg 1-2 mg in the AM and HS and 1 tablet prn midday for anxiety, temazepam 30 mg HS Patient received Spravato 84 mg today.  She tolerated it well without any unusual headache, nausea or vomiting or other somatic symptoms.  Dissociation did occur and she gradually saw resolution over the 2-hour period of observation.  She does not typically find the dissociation very strong. No SE complaints with meds. Continues to feel depressed and overwhelmed by family problems including her brother's mania and recent eviction and commitment.  Chronic OCD worse when stressed.  No SI.  Tolerating meds. Plan: Per her request continue Wellbutrin XL 450 mg every morning. She has come to the realization that the rash she had previously attributed to [facility] was not related.  She is interested in perhaps retrying Auvelity. There are few alternative medication options that remain.  01/11/23 appt noted: Current psych meds: Wellbutrin XL 150 mg BID and started Auvelity 1 AM, fluvoxamine 100 mg in AM and 300 mg HS, lorazepam 1 mg 1-2 mg in the AM and HS and 1 tablet prn midday for anxiety, temazepam 30 mg HS Patient received  Spravato 84 mg today.  She tolerated it well without any unusual headache, nausea or vomiting or other somatic symptoms.  Dissociation did occur and she gradually saw resolution over the 2-hour period of observation.  She does not typically find the dissociation very strong. No SE complaints with meds. Trouble getting to sessions lately DT transportation problems.   Continues to feel depressed and overwhelmed by OCD and family.  Feels she needs toevery other week Spravato bc it helps for a couple of weeks and then seems to wear off.  Struggleing with OCD and depression both of which are eased by Spravato. Less  crying with Auvelity.  02/07/23 appt noted: Current psych meds: Wellbutrin XL 150 mg BID and started Auvelity 1 AM, fluvoxamine 100 mg in AM and 300 mg HS, lorazepam 1 mg 1-2 mg in the AM and HS and 1 tablet prn midday for anxiety, temazepam 30 mg HS Patient received Spravato 84 mg today.  She tolerated it well without any unusual headache, nausea or vomiting or other somatic symptoms.  Dissociation did occur and she gradually saw resolution over the 2-hour period of observation.  She does not typically find the dissociation very strong. No SE complaints with meds. Trouble getting to sessions lately DT transportation problems.  This is a problem ongoing and thinks she might need to pause Spravato bc won't be able to get her for at least 3 weeks. She is holding pretty steady with a moderate level of anxiety and depression ongoing and chronic.    03/20/23 appt noted: Current psych meds: Wellbutrin XL 150 mg BID and started Auvelity 1 AM, fluvoxamine 100 mg in AM and 300 mg HS, lorazepam 1 mg 1-2 mg in the AM and HS and 1 tablet prn midday for anxiety, temazepam 30 mg HS Patient received Spravato 84 mg today.  She tolerated it well without any unusual headache, nausea or vomiting or other somatic symptoms.  Dissociation did occur and she gradually saw resolution over the 2-hour period of observation.  She does not typically find the dissociation very strong. No SE complaints with meds. She is trying to get back into more regular Spravato administration.  Family issues and transportation problems that led to her missing Spravato.  She feels more depressed without regular Spravato.  Her OCD is chronic but also worse when she misses Spravato.  She does not want any medication changes.  04/03/23 appt noted: Current psych meds: Wellbutrin XL 150 mg BID and started Auvelity 1 AM, fluvoxamine 100 mg in AM and 300 mg HS, lorazepam 1 mg 1-2 mg in the AM and HS and 1 tablet prn midday for anxiety, temazepam 30 mg  HS Patient received Spravato 84 mg today.  She tolerated it well without any unusual headache, nausea or vomiting or other somatic symptoms.  Dissociation did occur and she gradually saw resolution over the 2-hour period of observation.  She does not typically find the dissociation very strong.  It gets rid of negative emotion for awhile after procedure and would like it to last longer.   No SE complaints with meds.  Doesn't really want med changes.   Planning to weekly Spravato.  It is helping dep and anxiety.    04/10/23 appt noted: Current psych meds: Wellbutrin XL 150 mg BID and started Auvelity 1 AM, fluvoxamine 100 mg in AM and 300 mg HS, lorazepam 1 mg 1-2 mg in the AM and HS and 1 tablet prn midday for anxiety, temazepam 30 mg HS Patient  received Spravato 84 mg today.  She tolerated it well without any unusual headache, nausea or vomiting or other somatic symptoms.  Dissociation did occur and she gradually saw resolution over the 2-hour period of observation.  She does not typically find the dissociation very strong.  It gets rid of negative emotion for awhile after procedure and would like it to last longer.   No SE complaints with meds.  Doesn't really want med changes.   Had lidocaine shot for back pain with brief benefit.  Doing some water based PT Spravato went well today. Got pretty upset last week with event related to son's activities.  Got upset with son saying something inappropriate publicly.  Was so embarrassed.  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    He's kind of rebellious lately.  Son is 24 yo.    05/03/23 appt noted: Current psych meds: Wellbutrin XL 150 mg BID and started Auvelity 1 AM, fluvoxamine 100 mg in AM and 300 mg HS, lorazepam 1 mg 1-2 mg in the AM and HS and 1 tablet prn midday for anxiety, temazepam 30 mg HS No SE Patient received Spravato 84 mg today.  She tolerated it well without any unusual headache, nausea or  vomiting or other somatic symptoms.  Dissociation did occur and she gradually saw resolution over the 2-hour period of observation.  She does not typically find the dissociation very strong.  It gets rid of negative emotion for awhile after procedure and would like it to last longer.   Still pleased with benefit from [facility] for mood and anxiety. No SE complaints with meds.  Doesn't really want med changes.   Chronic family px ongoing affects her but nothing she can change.H sick of the family drama.   Continuing counseling helps.  Has some support. Mood and anxiety pretty steady but did go on vacation to [place].  Anxiety worse first in AM and then later at night with mind racing on stressors.   Still back trouble.  05/23/23 appt noted: Current psych meds: Wellbutrin XL 150 mg BID and started Auvelity 1 AM, fluvoxamine 100 mg in AM and 300 mg HS, lorazepam 1 mg 1-2 mg in the AM and HS and 1 tablet prn midday for anxiety, temazepam 30 mg HS No SE Patient received Spravato 84 mg today.  She tolerated it well without any unusual headache, nausea or vomiting or other somatic symptoms.  Dissociation did occur and she gradually saw resolution over the 2-hour period of observation.  She does not typically find the dissociation very strong.  It gets rid of negative emotion for awhile after procedure and would like it to last longer.   Still pleased with benefit from [facility] for mood and anxiety by 50%. No SE complaints with meds. Chronic family stress interferes with mental health and self care.  May have to reduce frequency of Spravato bc of this. But is status quo with meds.  Chronic residual OCD which is mod severe and dep moderate.  05/30/23 appt noted: Current psych meds: Wellbutrin XL 150 mg BID and started Auvelity 1 AM, fluvoxamine 100 mg in AM and 300 mg HS, lorazepam 1 mg 1-2 mg in the AM and HS and 1 tablet prn midday for anxiety, temazepam 30 mg HS No SE Patient received Spravato 84 mg  today.  She tolerated it well without any unusual headache, nausea or vomiting or other somatic symptoms.  Dissociation did  occur and she gradually saw resolution over the 2-hour period of observation.  She does not typically find the dissociation very strong.  It gets rid of negative emotion for awhile after procedure and would like it to last longer.   Still pleased with benefit from [facility] for mood and anxiety by 50% or better but not resolved. She wants to continue Spravato as frequently as schedule will allow. Both depression and OCD are better with most improvement in mood.  Asks about any new treatments for OCD. No SE complaints with meds. Chronic family stress interferes with mental health and self care.  This is an ongoing drain on her mood and resources emotionally.    06/06/23 appt noted: Current psych meds: Wellbutrin XL 150 mg BID and started Auvelity 1 AM, fluvoxamine 100 mg in AM and 300 mg HS, lorazepam 1 mg 1-2 mg in the AM and HS and 1 tablet prn midday for anxiety, temazepam 30 mg HS No SE Patient received Spravato 84 mg today.  She tolerated it well without any unusual headache, nausea or vomiting or other somatic symptoms.  Dissociation did occur and she gradually saw resolution over the 2-hour period of observation.  She does not typically find the dissociation very strong.  It gets rid of negative emotion for awhile after procedure and would like it to last longer.   Still believes each meds work and are helpful and well tolerated.  Specifically she thinks that the Auvelity adds additional benefit on taking Wellbutrin.  She believes the meds help both depression and OCD though the OCD remains a problem.  She also believes Spravato helps both types of symptoms as well.  Her mood is variable.  She experiences a great deal of stress from her family and those problems wax and wane.  She has bad days because of it and today is 1 of those days.  She is continuing counseling and that is  helpful.  Due to family demands she may have to spread out the Spravato frequency.  06/16/23 appt noted:  Current psych meds: Wellbutrin XL 150 mg BID and started Auvelity 1 AM, fluvoxamine 100 mg in AM and 300 mg HS, lorazepam 1 mg 1-2 mg in the AM and HS and 1 tablet prn midday for anxiety, temazepam 30 mg HS No SE Patient received Spravato 84 mg today.  She tolerated it well without any unusual headache, nausea or vomiting or other somatic symptoms.  Dissociation did occur and she gradually saw resolution over the 2-hour period of observation.  She does not typically find the dissociation very strong.  It gets rid of negative emotion for awhile after procedure . She is trying to make the Schedule work for [employer] oh every week or every other week because she is aware the treatment effects can be lost if it is time less frequently.  So she has been able to come the last 2 weeks, we will try to come in 2 weeks.  Spravato reduces depression and OCD significantly though she remains highly symptomatic with both.  However she has no suicidal thoughts.  Crying spells are reduced with Spravato.  She still spends a fair amount of time meaning over an hour a day checking and more under stress or when tired.  She asks about any new meds for OCD. Plan: increase Auvelity 1 in AM & PM Hold Wellbutrin XL 150 mg BID  07/12/23 appt noted: Current psych meds: Wellbutrin XL 150 mg BID and started Auvelity 1 AM,  fluvoxamine 100 mg in AM and 300 mg HS, lorazepam 1 mg 1-2 mg in the AM and HS and 1 tablet prn midday for anxiety, temazepam 30 mg HS.  Didn't increase Auvelity yet No SE Patient received Spravato 84 mg today.  She tolerated it well without any unusual headache, nausea or vomiting or other somatic symptoms.  Dissociation did occur and she gradually saw resolution over the 2-hour period of observation.  She does not typically find the dissociation very strong.  It gets rid of negative emotion for awhile after  procedure . She has not increased the Auvelity yet is planned.  She has a little bit of nervousness about it but admits is not rational.  She is willing to give that a try to try to get better control of depression and anxiety.  She continues to see the benefit of Spravato every other week.  She is struggling with some pain due to plantar fasciitis and given her cerebral palsy that makes it even more difficult to walk and to engage in normal activities.  She is willing to seek treatment for that.    Previous psych med trials include Prozac, paroxetine, sertraline, fluvoxamine, venlafaxine, Anafranil with no response,  Wellbutrin, , Viibryd, Trintellix 10 1 month NR Auvelity BID  Geodon,  risperidone, Rexulti, Abilify,  Seroquel, Latuda 40 mg with irritability.  lamotrigine lithium,  BuSpar, Namenda,  pramipexole with no response, and Topamax, pindolol  ECT-MADRS    Flowsheet Row Office Visit from 06/29/2021 in [facility]  MADRS Total Score 36      Flowsheet Row Admission (Discharged) from 06/11/2021 in [place] [facility]  C-SSRS RISK CATEGORY No Risk        Review of Systems:  Review of Systems  Constitutional:  Positive for fatigue.  Musculoskeletal:  Positive for arthralgias, back pain, gait problem, myalgias and neck pain.  Neurological:  Positive for weakness and headaches. Negative for tremors.  Psychiatric/Behavioral:  Positive for dysphoric mood and sleep disturbance. Negative for agitation and suicidal ideas. The patient is nervous/anxious.     Medications: I have reviewed the patient's current medications.  Current Outpatient Medications  Medication Sig Dispense Refill   Abaloparatide (TYMLOS) 3120 MCG/1.56ML SOPN Inject into the skin.     Azelastine-Fluticasone 137-50 MCG/ACT SUSP Place 1-2 sprays into both nostrils daily.     baclofen (LIORESAL) 10 MG tablet Take 20 mg by mouth at bedtime as needed for muscle spasms.      Dextromethorphan-buPROPion ER (AUVELITY) 45-105 MG TBCR Take 1 tablet by mouth in the morning and at bedtime. (Patient taking differently: Take 1 tablet by mouth in the morning and at bedtime. Only taking 1 daily.  Didn't increase) 60 tablet 1   dicyclomine (BENTYL) 10 MG capsule Take 10 mg by mouth daily.     docusate sodium (COLACE) 100 MG capsule Take 1 capsule (100 mg total) by mouth 2 (two) times daily. (Patient taking differently: Take 100 mg by mouth daily.) 10 capsule 0   Esketamine HCl, 84 MG Dose, (SPRAVATO, 84 MG DOSE,) 28 MG/DEVICE SOPK USE 3 SPRAYS IN EACH NOSTRIL NOSTRIL ONCE A WEEK 3 each 0   fexofenadine (ALLEGRA) 180 MG tablet Take 180 mg by mouth daily.     fluvoxaMINE (LUVOX) 100 MG tablet TAKE 1 TABLET IN THE AM AND 3 TABLETS AT NIGHT 360 tablet 0   hydrocortisone (ANUSOL-HC) 2.5 % rectal cream Place rectally 2 (two) times daily. x 7-14 days 30 g 0  ketotifen (ZADITOR) 0.025 % ophthalmic solution Place 3 drops into both eyes at bedtime.     LORazepam (ATIVAN) 1 MG tablet TAKE 1-2 IN THE AM AND 1-2 TABLETS EVERY NIGHT AT BEDTIME AND 1 TABLET IN AFTERNOON when needed for anxiety and sleep 150 tablet 2   magnesium gluconate (MAGONATE) 500 MG tablet Take 500 mg by mouth daily.     MIBELAS 24 FE 1-20 MG-MCG(24) CHEW Chew 1 tablet by mouth at bedtime as needed (bowel regularity).     Multiple Vitamins-Minerals (ADULT GUMMY PO) Take 2 tablets by mouth in the morning.     nitrofurantoin (MACRODANTIN) 100 MG capsule Take 100 mg by mouth as needed (For urinary tract infection.).      oxyCODONE-acetaminophen (PERCOCET/ROXICET) 5-325 MG tablet Take 1-2 tablets by mouth every 6 (six) hours as needed for severe pain. 50 tablet 0   polyethylene glycol (MIRALAX / GLYCOLAX) packet Take 17 g by mouth daily as needed for mild constipation. 14 each 0   propranolol ER (INDERAL LA) 60 MG 24 hr capsule TAKE 1 CAPSULE BY MOUTH EVERY DAY 30 capsule 5   psyllium (METAMUCIL) 58.6 % powder Take 1 packet by  mouth daily as needed (constipation).     SUMAtriptan (IMITREX) 100 MG tablet Take 1 tablet (100 mg total) by mouth every 2 (two) hours as needed for migraine. May repeat in 2 hours if headache persists or recurs. 9 tablet 2   temazepam (RESTORIL) 30 MG capsule TAKE 1 CAPSULE BY MOUTH AT BEDTIME AS NEEDED FOR SLEEP 30 capsule 2   Vitamin D-Vitamin K (VITAMIN K2-VITAMIN D3 PO) Take 1-2 sprays by mouth daily.     No current facility-administered medications for this visit.    Medication Side Effects: None   Allergies:  Allergies  Allergen Reactions   Hydrocodone Itching   Sulfamethoxazole-Trimethoprim Itching   Dust Mite Extract Other (See Comments)    Sneezing, watery eyes, runny nose   Latex Itching   Other Other (See Comments)    PT IS ALLERGIC TO CAT DANDER AND RAGWEED - Sneezing, watery eyes, runny nose    Pollen Extract Other (See Comments)    Sneezing, watery eyes, runny nose     Past Medical History:  Diagnosis Date   Abnormal Pap smear 2011   hpv/mild dysplasia,cin1   Anxiety    Cerebral palsy (HCC)    right arm/leg   Cystocele    Depression    Headache    Neuromuscular disorder (HCC)    Cerebral Palsy   OCD (obsessive compulsive disorder)    Osteoporosis    Uterine prolaps     Family History  Problem Relation Age of Onset   Cancer Father        skin AND LUNG   Alcohol abuse Sister        CRACK COCAINE    Social History   Socioeconomic History   Marital status: Married    Spouse name: Not on file   Number of children: Not on file   Years of education: Not on file   Highest education level: Not on file  Occupational History   Not on file  Tobacco Use   Smoking status: Never   Smokeless tobacco: Never  Substance and Sexual Activity   Alcohol use: Not Currently    Comment: OCCASIONAL beer   Drug use: No   Sexual activity: Yes    Birth control/protection: Pill    Comment: LOESTRIN 24 FE  Other Topics Concern   Not  on file  Social History  Narrative   Not on file   Social Determinants of Health   Financial Resource Strain: Not on file  Food Insecurity: Not on file  Transportation Needs: Not on file  Physical Activity: Not on file  Stress: Not on file  Social Connections: Not on file  Intimate Partner Violence: Not on file    Past Medical History, Surgical history, Social history, and Family history were reviewed and updated as appropriate.   Please see review of systems for further details on the patient's review from today.   Objective:   Physical Exam:  LMP  (LMP Unknown)   Physical Exam Constitutional:      General: She is not in acute distress. Neurological:     Mental Status: She is alert and oriented to person, place, and time.     Cranial Nerves: No dysarthria.     Motor: Weakness present.     Gait: Gait abnormal.  Psychiatric:        Attention and Perception: Attention and perception normal.        Mood and Affect: Mood is anxious and depressed.        Speech: Speech normal.        Behavior: Behavior normal. Behavior is cooperative.        Thought Content: Thought content normal. Thought content is not delusional. Thought content does not include homicidal or suicidal ideation. Thought content does not include suicidal plan.        Cognition and Memory: Cognition and memory normal. Cognition is not impaired.        Judgment: Judgment normal.     Comments: Insight intact Ongoing OCD remains fairly severe but less anxious Checking compulsions up to 2 hours daily but improved noticeably with Spravato Chronic depression persistent but better with Spravato  and helps OCD too.        Lab Review:     Component Value Date/Time   NA 138 06/11/2021 0606   K 4.0 06/11/2021 0606   CL 107 06/11/2021 0606   CO2 26 06/11/2021 0606   GLUCOSE 90 06/11/2021 0606   BUN 18 06/11/2021 0606   CREATININE 0.81 06/11/2021 0606   CALCIUM 9.4 06/11/2021 0606   PROT 6.5 06/11/2021 0606   ALBUMIN 3.3 (L)  06/11/2021 0606   AST 17 06/11/2021 0606   ALT 14 06/11/2021 0606   ALKPHOS 141 (H) 06/11/2021 0606   BILITOT 0.2 (L) 06/11/2021 0606   GFRNONAA >60 06/11/2021 0606   GFRAA >60 07/09/2016 0438       Component Value Date/Time   WBC 5.8 06/11/2021 0606   RBC 4.12 06/11/2021 0606   HGB 12.5 06/11/2021 0606   HCT 39.7 06/11/2021 0606   PLT 299 06/11/2021 0606   MCV 96.4 06/11/2021 0606   MCH 30.3 06/11/2021 0606   MCHC 31.5 06/11/2021 0606   RDW 13.9 06/11/2021 0606   LYMPHSABS 1.9 06/11/2021 0606   MONOABS 0.5 06/11/2021 0606   EOSABS 0.1 06/11/2021 0606   BASOSABS 0.0 06/11/2021 0606    No results found for: "POCLITH", "LITHIUM"   No results found for: "PHENYTOIN", "PHENOBARB", "VALPROATE", "CBMZ"   .res Assessment: Plan:    Ivadell "[name]" was seen today for follow-up, depression, anxiety and stress.  Diagnoses and all orders for this visit:  Recurrent major depression resistant to treatment [facility])  Mixed obsessional thoughts and acts  Social anxiety disorder  Insomnia due to mental condition  Migraine without aura and without status migrainosus,  not intractable     Both primary Dx of OCD and major depression are TR and marked.  Impaired function but less so with Spravato re: depression..   She is receiving Spravato 84 mg weekly and moderate improvement in the depression..  she feels it also helps OCD somewhat.  However still easily overwhelmed with low stress tolerance.  Family contributes to her anxiety and stress markedly.  The OCD is improved with the increase in fluvoxamine and with Spravato.  Spends up to 2 hours daily and checking compulsions on her worst days but better when she travels.  No new options for tx are evident.  She has been on higher doses of fluvoxamine above the usual max of 400 mg daily in the past and finds it more helpful at the higher dose.    Disc SE.   She is tolerating the meds well  Continue  Luvox back to 400 mg nightly as of January  2023. Disc dosing higher than usual.  She feels this is increase has helped more with OCD which remains chronically severe.   Option increase Auvelity BID and reduce Wellbutrin to 1 AM. But she didn't think it made a lot more difference than 1 Auveltiy daily when taken in summer 2023. Consider trying to increase it again.  We have discussed seizure risk that is possible using this combination but given severity of her symptoms she feels the risk is warranted. There are few other alternative medication options that remain.   Disc DDI issues.   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 She doesn't want to try this now.  Disc Spravato DT TRD incl details and SE. Disc dosing and duration.  Pt with severe depression MADRS 36 on 06/29/21  Patient was administered Spravato 84 mg intranasally dosage today.  The patient experienced the typical dissociation which gradually resolved over the 2-hour period of observation.  There were no complications.  Specifically the patient did not have nausea or vomiting or headache.  Blood pressures remained within normal ranges at the 40-minute and 2-hour follow-up intervals.  By the time the 2-hour observation period was met the patient was alert and oriented and able to exit without assistance. She tends to have lingering sedative effects but not severe. .  See nursing note for further details.  Didn't feel as well when off spravato but disc family issurs interfering with frequency.  We discussed the short-term risks associated with benzodiazepines including sedation and increased fall risk among others.  Discussed long-term side effect risk including dependence, potential withdrawal symptoms, and the potential eventual dose-related risk of dementia.  But recent studies from 2020 dispute this association between benzodiazepines and dementia risk. Newer studies in 2020 do not support an association with dementia. Disc this is high dose and not  ideal.  Also disc risk combining it with temazepam. Rec try gradually reduce HS lorazepam to 1 mg Hs.  Can continue lorazepam 2 mg AM and 1 mg in afternoon bc of chronic anxiety and it is helpful and tolerated. She can continue temazepam 30 mg nightly.  She tends to have a lot of anxious negative thoughts at night when she is trying to go to bed.  She is trying to reduce the dose.  Complaining of HA and history migraine.  Asks for increase imitrex and disc preventatives like propranolol ER imitrex to 100 mg prn migraine and propranolol ER for migraine prevention.  Supportive therapy dealing with some of the recent  stressors including son's autism and recent issues of rebelliousness with him.    As recommended try increase Auvelity 1 in AM & PM Hold Wellbutrin XL 150 mg BID Fluvoxamine 100 mg AM and 300 mg PM above usual max bc med necessary Lorazepam 2 mg HS and prn 1 mg BID prn anxiety daily Temazepam 30 mg HS Propranolol ER 60 mg daily Imitrex prn migraine  Follow-up every week if possible and if note every other week and try to be consistent. That is her plan but family demands do interfere at times.     Meredith Staggers, MD, DFAPA  Please see After Visit Summary for patient specific instructions.  Future Appointments  Date Time Provider Department Center  07/18/2023 11:00 AM Robley Fries, PhD [facility] None  08/01/2023 11:00 AM Robley Fries, PhD [facility] None  08/16/2023  6:00 PM Robley Fries, PhD [facility] None  08/30/2023  6:00 PM Robley Fries, PhD [facility] None  09/20/2023  6:00 PM Mitchum, Molly Maduro, PhD [facility] None    No orders of the defined types were placed in this encounter.    -------------------------------

## 2023-07-14 ENCOUNTER — Telehealth: Payer: Self-pay | Admitting: Psychiatry

## 2023-07-14 NOTE — Telephone Encounter (Signed)
Pt lvm that she wants to be on the spravato scheduled for Tuesday October 8 th at 3 pm

## 2023-07-14 NOTE — Telephone Encounter (Signed)
Noted will add her to the schedule

## 2023-07-17 ENCOUNTER — Other Ambulatory Visit: Payer: Self-pay | Admitting: Psychiatry

## 2023-07-18 ENCOUNTER — Ambulatory Visit (INDEPENDENT_AMBULATORY_CARE_PROVIDER_SITE_OTHER): Payer: 59 | Admitting: Psychiatry

## 2023-07-18 DIAGNOSIS — Z638 Other specified problems related to primary support group: Secondary | ICD-10-CM

## 2023-07-18 DIAGNOSIS — F339 Major depressive disorder, recurrent, unspecified: Secondary | ICD-10-CM | POA: Diagnosis not present

## 2023-07-18 DIAGNOSIS — F4010 Social phobia, unspecified: Secondary | ICD-10-CM

## 2023-07-18 DIAGNOSIS — Z636 Dependent relative needing care at home: Secondary | ICD-10-CM

## 2023-07-18 DIAGNOSIS — F422 Mixed obsessional thoughts and acts: Secondary | ICD-10-CM | POA: Diagnosis not present
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
## 2023-07-21 ENCOUNTER — Other Ambulatory Visit: Payer: Self-pay | Admitting: Psychiatry

## 2023-07-24 ENCOUNTER — Other Ambulatory Visit: Payer: Self-pay | Admitting: Psychiatry

## 2023-07-25 ENCOUNTER — Ambulatory Visit: Payer: 59 | Admitting: Psychiatry

## 2023-07-25 ENCOUNTER — Ambulatory Visit: Payer: 59

## 2023-07-25 ENCOUNTER — Encounter: Payer: Self-pay | Admitting: Psychiatry

## 2023-07-25 VITALS — BP 129/93 | HR 75

## 2023-07-25 DIAGNOSIS — F339 Major depressive disorder, recurrent, unspecified: Secondary | ICD-10-CM

## 2023-07-25 DIAGNOSIS — F4010 Social phobia, unspecified: Secondary | ICD-10-CM | POA: Diagnosis not present

## 2023-07-25 DIAGNOSIS — F422 Mixed obsessional thoughts and acts: Secondary | ICD-10-CM | POA: Diagnosis not present

## 2023-07-25 DIAGNOSIS — F5105 Insomnia due to other mental disorder: Secondary | ICD-10-CM | POA: Diagnosis not present

## 2023-07-25 DIAGNOSIS — G43009 Migraine without aura, not intractable, without status migrainosus: Secondary | ICD-10-CM

## 2023-07-25 NOTE — Progress Notes (Signed)
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 Pt is being treated for Treatment Resistant Depression, Pt taken to treatment room. Pt's medication is charged through [employer].  Medication is stored behind 2 locked doors, it is never given to the pt until time of administration which is observed by the nurse. Disposed of per FDA/REMS regulations. All Spravato Treatments are documented in Spravato REMS per protocol of being a treatment center. Spravato is a CIII medication and has to be only given at a treatment facility and observed by nurse as pt administered intranasally   She was directed to the treatment room to get vitals taken first. Initial vital signs are B/P at 3:22 PM 112/81, 77, Pt instructed to blow her nose and to recline back at 45 degrees. Pt given first nasal spray (28 mg) administered by pt observed by nurse. There were 5 minutes between each dose, total of 84 mg. Tolerated well. Assessed pt's 40 minute vital signs at 4:00 PM 114/83, 88.  Pt met with [name] and they discussed her care at the end of her treatment when her thoughts are clearer and her medication and moods. She does go to the bathroom at least once during her treatment. No sedation and had slight feeling of being "high" she reports. Discharge vitals at 5:20 PM 129/93, 75. Pt stable for discharge.  Pt was observed on site a total of 120 minutes per FDA/REMS requirements. Pt was with nurse for clinical assessment 50 minutes. pt wants to schedule for October 22nd.    LOT [id number] EXP APR 2027

## 2023-07-25 NOTE — Progress Notes (Signed)
Casey Diaz [id number] 06-May-1966 57 y.o.    Subjective:   Patient ID:  Casey Diaz is a 57 y.o. (DOB Mar 16, 1966) female.  Chief Complaint:  Chief Complaint  Patient presents with   Follow-up   Depression   Anxiety   Stress     HPI Casey Diaz presents to the office today for follow-up of OCD and severe anxiety.     December 2019 visit the following was noted: No meds were changed. Lives in [name] and back for followup.  Sx are about the same.  Has to take meds with different sizes. Pt reports that mood is Anxious and Depressed and describes anxiety as Severe. Anxiety symptoms include: Excessive Worry, Obsessive Compulsive Symptoms:   Checking,,. Pt reports has interrupted sleep and nocturia. Pt reports that appetite is good. Pt reports that energy is no change and down slightly. Concentration is down slightly. Suicidal thoughts:  denied by patient. Loves the environment of [name] but misses some things there.  She's not able to work there.  H works there and likes it.  Struggled with not working, feels isolated and not up to task of meeting people.  Does attend a church and met a friend who's been helpful.  Leaving for [name] on 10/16/18.   04/09/2020 appointment the following is noted:  Staying another year in [name] bc Covid and other things. Last few months a lot of crying spells.  Is in menopause. Wonders about med changes though is nervous about it.  Crying spells associated with depressing thoughts more than stress or OCD.   Covid really hard on everyone and couldn't see family for 18 mos.  Family still very dysfunctional. No close friends in part due to OCD and depression. Son high Autism spectrum with ADHD and anxiety and she's with him all the time. Greater health problems with CP so more pains.   05/15/20 appt with the following noted: Casey Diaz for menopause and helps some. Still depressed.  Chronically. In [place] for 2 more weeks then to [name] for  another year. A lot of stressors lately triggering more checking and anxiety.   OCD is her CC now and seems.  Got worse DT stress.   Stressed with Asberger's son and her health.  H works a lot.  Her FOO still stress. Plan: Trintellix 10 mg 1 tablet in the morning with food and reduce fluvoxamine to 5 tablets nightly for 1 week  then reduce it to 4 tablets nightly.   07/02/20 appt with the following noted: Decided not to get Trintellix bc difficulty getting it. It is available.  There.  Wants to start it now.   Both depression and OCD are severe.  Not suicidal in intent or plan. Did not take samples with her to [name] but will be back in December. covid is worse there and travel is difficult.  Wants to reduce Wellbutrin DT dry mouth. Plan: She's afraid to reduce Luvox at this time DT fear of worsening OCD.  But will consider. Trintellix 10 mg 1 tablet in the morning with food and reduce fluvoxamine to 5 tablets nightly for 1 week  then reduce it to 4 tablets nightly. Also reduce Wellbutrin XL to 300 mg daily.    9-13 2022 appointment with the following noted: Back in [place] since July 14.  Broke arm a month ago and surgery.  It's all been rough adjustment.   Casey Diaz has cancer on his face and M fell taking him to the doctor.  Misses the water and weather of [name].   Cry a lot more since menopause. Still depression and anxiety and OCD.  Asks about ketamine. On Wellbutrin 300, Luvox 300.  No Trintellix. Added Ativan 2 mg AM and HS and it helps.  More likely to get upset at night. Plan: Increase Luvox back to 400 mg daily.  She thinks she's worse on less. Continue Wellbutrin XL to 300 mg daily. 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  She tolerated it well without unusual nausea or vomiting headache or other somatic symptoms.  She did have the expected dissociation which gradually resolved over  the course of the 2-hour period of observation.  She was a little concerned about her balance given her cerebral palsy but has not noted unusual or unexpected problems.  She is motivated to can continue Spravato in hopes of reducing her depressive symptoms. 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  She also has treatment resistant OCD which is partially managed with medications but is still quite disabling.  She is tolerating the medications well.  She is sleeping adequately.  Her appetite is adequate.  She is not having suicidal thoughts.  She continues to wish for a better treatment for OCD that would give her some relief.  09/30/2021 appointment with the following noted: She received her first dose of Spravato 84 mg intranasally today.  She tolerated it well without unusual nausea, vomiting, or other somatic symptoms.  Dissociation as expected did occur and gradually resolved over the 2-hour period of observation.  She did have a mild headache today with the treatment and received ibuprofen 600 mg at her request.  We will follow this to see if it is a pattern Patient is still depressed.  She said she was late with her medicine today and today was a particularly depressing day.  However she notes that the Spravato has lifted her mood considerably even today.  She is hopeful that it will continue to be helpful.  No suicidal thoughts.  She has ongoing chronic anxiety and OCD at baseline.  10/04/21 appt noted: Patient received Spravato 84 mg for the second time today.  She tolerated it well without any unusual headache, nausea or vomiting or other somatic symptoms.  Dissociation did occur and she gradually [place] resolution over the 2-hour period of observation. She did not have any unusual problems after she left the office last Spravato administration.  She did not have any specific problems with balance or walking.  She is at increased risk of that difficulty because of  cerebral palsy.  So far she has not noticed much mood effect from the medication beyond the first day of receiving it.  However she would like to continue Spravato in hopes of getting the antidepressant effect that is desired. Stress dealing with mother's behavior at party pt hosted.  Guilt over it.  10/07/2021 appointment noted: Patient received Spravato 84 mg for the second time today.  She tolerated it well without any unusual headache, nausea or vomiting or other somatic symptoms.  Dissociation did occur and she gradually [place] resolution over the 2-hour period of observation. She still is not sure about the antidepressant effect of Spravato.  Events over the holidays and demands, make it difficult to assess.  She still notes that the OCD tends to worsen the depression and vice versa.  She tolerates the  Spravato well and wants to continue the trial.  10/15/2021 appointment with the following noted: Patient received Spravato 84 mg for the second time today.  She tolerated it well without any unusual headache, nausea or vomiting or other somatic symptoms.  Dissociation did occur and she gradually [place] resolution over the 2-hour period of observation. Patient says it was somewhat difficult to evaluate the effect of the Spravato.  It was scheduled to be twice weekly for 4 weeks consecutively but the holidays have interfered with that administration.  She asked what specifically should be she should be looking for in order to assess improvement.  That was discussed.  The OCD is unchanged and the depression so far is not significantly different.  She still tolerates meds.  There have been no recent med changes  10/19/2021 appt noted: Patient received Spravato 84 mg for the second today.  She tolerated it well without any unusual headache, nausea or vomiting or other somatic symptoms.  Dissociation did occur and she gradually saw resolution over the 2-hour period of observation.   10/21/2021 appointment  noted: Patient received Spravato 84 mg today.  She tolerated it well without any unusual headache, nausea or vomiting or other somatic symptoms.  Dissociation did occur and she gradually saw resolution over the 2-hour period of observation.  She feels better than last week.  She is not as depressed and down.  She is still dealing with grief around the death of her cousin that was unexpected.  It is still difficult to tell how much the Spravato was doing but she is hopeful.  Anxiety is still present with the OCD.  She is not having suicidal thoughts.  She is not hopeless.  She wants to continue treatment.  10/25/2021 appointment with the following noted: Patient received Spravato 84 mg today.  She tolerated it well without any unusual headache, nausea or vomiting or other somatic symptoms.  Dissociation did occur and she gradually saw resolution over the 2-hour period of observation.  She does not typically find the dissociation very strong. She is beginning to think the Spravato is helping somewhat with the depression.  It has been difficult to tell with the holidays intervening as well as the death of her cousin.  She has not been able to get Spravato twice weekly for 4 weeks straight as typically planned.  However she is hopeful.  The OCD remains significant.  She still has a tendency to think very negatively.  She is not suicidal.  10/28/2021 appointment with the following noted: Patient received Spravato 84 mg today.  She tolerated it well without any unusual headache, nausea or vomiting or other somatic symptoms.  Dissociation did occur and she gradually saw resolution over the 2-hour period of observation.  She does not typically find the dissociation very strong. She is feeling more hopeful about the administration of Spravato.  She is having less depression she believes.  Still not dramatically different.  She still has a tendency to have a lot of anxiety and rumination and OCD.  She is not suicidal.   She is eager to continue the Spravato.  11/01/2021 appointment with the following noted: Patient received Spravato 84 mg today.  She tolerated it well without any unusual headache, nausea or vomiting or other somatic symptoms.  Dissociation did occur and she gradually saw resolution over the 2-hour period of observation.  She does not typically find the dissociation very strong. She is continuing to see a little bit of improvement in depression  with Spravato.  The anxiety remains but may be not as severe.  The OCD remains markedly severe chronically.  She is not suicidal.  She is encouraged by the degree of improvement with Spravato and inability to enjoy things more and not be quite as ruminative.  11/04/2021 appt noted: Patient received Spravato 84 mg today.  She tolerated it well without any unusual headache, nausea or vomiting or other somatic symptoms.  Dissociation did occur and she gradually saw resolution over the 2-hour period of observation.  She does not typically find the dissociation very strong. No SE complaints with meds. She continues to feel hopeful about the Spravato.  She has less depression.  Because of a number of factors she is uncertain of the full benefit but thinks she is somewhat less depressed.  Her anxiety and OCD remain significant but a little better.  She is tolerating the medications and does not desire medicine change.  She is not currently complaining of insomnia.   11/08/2021 appointment the following noted: Patient received Spravato 84 mg today.  She tolerated it well without any unusual headache, nausea or vomiting or other somatic symptoms.  Dissociation did occur and she gradually saw resolution over the 2-hour period of observation.  She does not typically find the dissociation very strong. No SE complaints with meds. She feels the Spravato is helping somewhat.  She would like to see a greater effect.  However she is able to enjoy things.  She is productive at home.   She would like to see a lifting of a degree of sadness that remains.  The anxiety and OCD remained largely unchanged.  She wondered about the dosing of Wellbutrin 300 mg a day and Luvox 300 mg a day and possible increases.  She has been at higher doses in the past.  She plans to start water therapy for her weakness and for her shoulder.  11/11/2021 appointment with the following noted: Patient received Spravato 84 mg today.  She tolerated it well without any unusual headache, nausea or vomiting or other somatic symptoms.  Dissociation did occur and she gradually saw resolution over the 2-hour period of observation.  She does not typically find the dissociation very strong. No SE complaints with meds. She feels the Spravato is clearly helping the depression.  She would like to see a more significant effect.  She is still having trouble thinking positive. Her energy is fair.  Concentration is good except for the problem with chronic obsessions. She has been taking Wellbutrin 300 mg in Luvox 300 mg for quite some time but has taken higher doses in the past.  We discussed that.  She would like to try higher doses in order to get a better effect if possible. We just increased the doses a couple of days ago.  No effect yet.  11/15/2021 appointment with the following noted: Patient received Spravato 84 mg today.  She tolerated it well without any unusual headache, nausea or vomiting or other somatic symptoms.  Dissociation did occur and she gradually saw resolution over the 2-hour period of observation.  She does not typically find the dissociation very strong. No SE complaints with meds. The patient is now convinced that the Spravato is helping the depression.  She would like to continue twice weekly Spravato this week if possible.  She has tolerated the increase in Wellbutrin to 450 mg daily and the increase and fluvoxamine to 400 mg daily without complications thus far.  The OCD and anxiety feed  the  depression to some extent. She spends approximately 2 hours daily with checking compulsions due to obsessions about causing harm to others.  For example fearing that when she has hit a pot hole that she may have hit a person and going back to check.  Checking corners and rooms out of fear that she may have harmed someone.  Other various checking compulsions.  She is hoping the increase in fluvoxamine to 400 mg will reduce that over the weeks to come.  She is not seeing a significant difference with the addition of the Spravato though she understands that was not expected.  She is more productive at home and more motivated and able to enjoy things more fully as a result of the Spravato treatment.  She is tolerating the medication  11/18/2021 appointment with the following noted: Patient received Spravato 84 mg today.  She tolerated it well without any unusual headache, nausea or vomiting or other somatic symptoms.  Dissociation did occur and she gradually saw resolution over the 2-hour period of observation.  She does not typically find the dissociation very strong. No SE complaints with meds. She clearly believes the Spravato has been helpful for the depression.  She wonders whether to continue to treatments weekly or to cut back to 1 weekly.  She would like to continue twice weekly in hopes of getting additional improvement in the depression because it is not resolved but it is difficult to get here twice a week in terms of arranging rides. She is recently increased Wellbutrin XL to 450 mg daily and fluvoxamine to 400 mg daily but they have not had time to have an official effect.  She is tolerating that well.  She is tolerating meds overwork overall well. The OCD remains the same as noted on 11/15/2021  11/25/21 appt noted: Patient received Spravato 84 mg today.  She tolerated it well without any unusual headache, nausea or vomiting or other somatic symptoms.  Dissociation did occur and she gradually saw  resolution over the 2-hour period of observation.  She does not typically find the dissociation very strong. No SE complaints with meds. She thinks the increase in Wellbutrin and Luvox have been potentially helpful for depression and OCD respectively.  It has been too early to see the full effect.  She is sleeping and eating well.  She is functioning at home.  She still spends a lot of time that is about 2 hours a day dealing with compulsive behaviors.  12/02/21 appt noted: Patient received Spravato 84 mg today.  She tolerated it well without any unusual headache, nausea or vomiting or other somatic symptoms.  Dissociation did occur and she gradually saw resolution over the 2-hour period of observation.  She does not typically find the dissociation very strong. No SE complaints with meds. Several losses and stressors recently that affect her sense of mood. However still sees significant benefit from the Spravato for her depression.  Wants to continue it. Suspect early  some benefit from the increased Wellbutrin for depression and Luvox for OCD. Tolerating meds. No complaints about the meds. Sleeping and eating well.  No new health concerns.  12/09/21 appt noted: Patient received Spravato 84 mg today.  She tolerated it well without any unusual headache, nausea or vomiting or other somatic symptoms.  Dissociation did occur and she gradually saw resolution over the 2-hour period of observation.  She does not typically find the dissociation very strong. No SE complaints with meds. Seeing noticeable improvement from  increase fluvoxamine to 400 mg daily.  Tolerating meds without concerns over them. Depression is stable with residual sx of easy guilt and easily stressed.  OCD contributes to depression but depression is not severe with less crying spells.  Productive at home with chores.  Enjoyed recent birthday.  Sleeping good. No new concerns.  12/23/2021 appointment noted: Patient received Spravato 84 mg  today.  She tolerated it well without any unusual headache, nausea or vomiting or other somatic symptoms.  Dissociation did occur and she gradually saw resolution over the 2-hour period of observation.  She does not typically find the dissociation very strong. No SE complaints with meds. Seeing noticeable improvement from increase fluvoxamine to 400 mg daily.  Tolerating meds without concerns over them. Her depression is somewhat improved with the Spravato.  She also feels generally a little lighter.  She is more motivated.  She is less overwhelmed by guilt.  The OCD is gradually improving but is still quite time-consuming as noted before.  She is sleeping well.  No side effects  12/30/2021 appointment with the following noted: Patient received Spravato 84 mg today.  She tolerated it well without any unusual headache, nausea or vomiting or other somatic symptoms.  Dissociation did occur and she gradually saw resolution over the 2-hour period of observation.  She does not typically find the dissociation very strong. No SE complaints with meds. Seeing noticeable improvement from increase fluvoxamine to 400 mg daily.  Tolerating meds without concerns over them. She is confident of her the improvement seen with Spravato.  She is less hopeless.  Guilt is marked remarkably improved.  She is not having any thoughts of death or dying.  She is more motivated for activities such as exercise which she is recently started.  She is sleeping well. The OCD remains severe but it is improving somewhat with the increase in fluvoxamine.  It is still consuming a couple hours per day.  01/10/22 apravato 84 admin  01/24/22 appt noted: Patient received Spravato 84 mg today.  She tolerated it well without any unusual headache, nausea or vomiting or other somatic symptoms.  Dissociation did occur and she gradually saw resolution over the 2-hour period of observation.  She does not typically find the dissociation very strong. No  SE complaints with meds. Very tearful today.  Feels like she has been suppressing emotion in the Spravato caused it to be released.  Discussed some stressors.  Overall still feels the medicine is helpful.  She has missed some of the scheduled Spravato treatments that were intended to be weekly due to circumstances beyond her control.  She is still struggling with OCD as previously noted but does believe the medications are helpful. Plan no med changes  01/31/2022 received Spravato 84 mg today  02/09/2022 appointment with the following noted: Patient received Spravato 84 mg today.  She tolerated it well without any unusual headache, nausea or vomiting or other somatic symptoms.  Dissociation did occur and she gradually saw resolution over the 2-hour period of observation.  She does not typically find the dissociation very strong. No SE complaints with meds. Spravato clearly helps depression and OCD but easily gets overwhelmed and tearful with fairly routine stressors.  Tolerating meds. Sleep and appetite is OK Asks to increase lorazepam to 2 mg AM and HS and 1mg  afternoon  02/16/22 appt noted: Patient received Spravato 84 mg today.  She tolerated it well without any unusual headache, nausea or vomiting or other somatic symptoms.  Dissociation did  occur and she gradually saw resolution over the 2-hour period of observation.  She does not typically find the dissociation very strong. No SE complaints with meds. She has chronic depesssion and OCD but is improved with Spravato, both dx versus before.  She has continued Luvox 400 mg and Wellbutrin 450 mg and is tolerating it.  Chronically easily stressed.  Tolerating all meds.  Doesn't like taking more meds.  Spending a couple hours daily with OCD.  No SI No med changes.  02/21/22 appt noted:   Doesn't like taking more meds.  Spending a couple hours daily with OCD.  No SI No med changes.  02/21/22 appt noted: Patient received Spravato 84 mg today.  She  tolerated it well without any unusual headache, nausea or vomiting or other somatic symptoms.  Dissociation did occur and she gradually saw resolution over the 2-hour period of observation.  She does not typically find the dissociation very strong. No SE complaints with meds. She has chronic depesssion and OCD but is improved with Spravato, both dx versus before.  She has continued Luvox 400 mg and Wellbutrin 450 mg and is tolerating it.  Chronically easily overwhelmed and doesn't know why.  Tolerating all meds. Wants to continue meds.  03/16/22 appt noted: Patient received Spravato 84 mg today.  She tolerated it well without any unusual headache, nausea or vomiting or other somatic symptoms.  Dissociation did occur and she gradually saw resolution over the 2-hour period of observation.  She does not typically find the dissociation very strong. No SE complaints with meds. Overall she still feels the Spravato has been helpful not only for her depression but also for her OCD which was somewhat unexpected.  OCD is still significant but it is less severe than prior to starting Spravato.  She is tolerating Luvox 400 mg and Wellbutrin 450 mg.  We discussed possible med adjustments.  03/23/22 appt noted: Patient received Spravato 84 mg today.  She tolerated it well without any unusual headache, nausea or vomiting or other somatic symptoms.  Dissociation did occur and she gradually saw resolution over the 2-hour period of observation.  She does not typically find the dissociation very strong. No SE complaints with meds. She is still depressed and still has OCD of course but is improved with the Spravato.  She is tolerating the medications well.  We had previously discussed the possibility of switching some of the Wellbutrin to [facility] and she is very interested in that in hopes of further improvement in depression and OCD.  She understands that Auvel[place] is not used for OCD on the label.  She is tolerating the  medications.  She is still easily overwhelmed.  She is sleeping and eating okay.. Plan: Reduce Wellbutrin XL to 300 mg AM and add [place] 1 tablet each AM  03/30/22 appt noted: Patient received Spravato 84 mg today.  She tolerated it well without any unusual headache, nausea or vomiting or other somatic symptoms.  Dissociation did occur and she gradually saw resolution over the 2-hour period of observation.  She does not typically find the dissociation very strong. No SE complaints with meds. She is still depressed and still has OCD of course but is improved with the Spravato.  She is tolerating the medications well.  No difference with Auvelity 1 AM so far and no SE.  Going on vacation on Saturday. 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 Sleep and appetite good. Plan: Increase Auvelity to 1 twice daily and reduce Wellbutrin  to XL 150 every morning  04/14/2022 appointment with the following noted: Patient received Spravato 84 mg today.  She tolerated it well without any unusual headache, nausea or vomiting or other somatic symptoms.  Dissociation did occur and she gradually saw resolution over the 2-hour period of observation.  She does not typically find the dissociation very strong. No SE complaints with meds. She is still depressed and still has OCD of course but is improved with the Spravato.  She is tolerating the medications well.  Just increased Auvelity to BID yesterday and reduced Wellbutrin to 150 AM. No SE so far.  No change in mood or anxiety so far.  Chronic OCD as noted and residual depression and chronic fatigue.  04/21/2022 appointment with the following noted: Patient received Spravato 84 mg today.  She tolerated it well without any unusual headache, nausea or vomiting or other somatic symptoms.  Dissociation did occur and she gradually saw resolution over the 2-hour period of observation.  She does not typically find the dissociation very strong. No SE complaints with  meds. She is still depressed and still has OCD of course but is improved with the Spravato.   She has questions about the dosing of lorazepam. She tends to have negative anxious thoughts at night.  This tends to interfere with her ability to go to sleep.  She is getting about 8 to 9 hours of sleep.  She is tolerating the meds without excessive sedation and does not nap during the day.  05/06/22 appt noted: Patient received Spravato 84 mg today.  She tolerated it well without any unusual headache, nausea or vomiting or other somatic symptoms.  Dissociation did occur and she gradually saw resolution over the 2-hour period of observation.  She does not typically find the dissociation very strong. No SE complaints with meds. She is still depressed and still has OCD of course but is improved with the Spravato.   Had some questions about timing of dosing of fluvoxamine and Auvelity. OCD is not quite as time consuming.  Sleep and eating are the same.   No SE meds.  05/25/22 appt noted: Patient received Spravato 84 mg today.  She tolerated it well without any unusual headache, nausea or vomiting or other somatic symptoms.  Dissociation did occur and she gradually saw resolution over the 2-hour period of observation.  She does not typically find the dissociation very strong. No SE complaints with meds. She is still depressed and still has OCD of course but is improved with the Spravato.   She has less OCD when away from home and on vacation of note. Plan: Rec gradually reduce HS lorazepam to 1 mg Hs.  Can continue lorazepam 2 mg AM and 1 mg in afternoon bc of chronic anxiety and it is helpful and tolerated. She can continue temazepam 30 mg nightly.  She tends to have a lot of anxious negative thoughts at night when she is trying to go to bed  06/16/22 appt noted: Patient received Spravato 84 mg today.  She tolerated it well without any unusual headache, nausea or vomiting or other somatic symptoms.   Dissociation did occur and she gradually saw resolution over the 2-hour period of observation.  She does not typically find the dissociation very strong. No SE complaints with meds. She is still depressed and still has OCD of course but is improved with the Spravato.   She has less OCD when away from home and on vacation of note. She is tolerating  the medications.  She has continued current medications. 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  She still receives benefit from [facility].  Her depression and OCD are better with the Spravato.  She thinks she is getting additional benefit with the switch from Wellbutrin to [place].  07/04/2022 appointment noted: Reports she developed a rash on her face from [facility] and feels like she is allergic to it.  She stopped it and went back to Wellbutrin 450 mg every morning.  The rash has cleared up.  She did not require any medical attention and did not have shortness of breath. Overall her depression and OCD are about the same as they have been.  She did not notice a substantial difference from the brief treatment with Auvelity but she understands she did not take a full course.  She is tolerating the current medicines well. Current meds fluvoxamine 400 mg daily, Wellbutrin XL 450 mg daily, lorazepam 1 to 2 mg in the morning and 1 to 2 mg at night and 1 mg in the afternoon, temazepam 30 mg nightly. She wants to continue the Spravato because she feels it has been helpful for both her depression and her racing OCD  07/18/22 appt noted: Patient received Spravato 84 mg today.  She tolerated it well without any unusual headache, nausea or vomiting or other somatic symptoms.   Dissociation did occur and she gradually saw resolution over the 2-hour period of observation.  She does not typically find the dissociation very strong. No SE complaints with meds. She is still depressed and still has OCD of course but is improved with the Spravato.  Rash better off Auvelity and back on Welllbutrin XL 450 mg AM, fluvoxamine 400 mg daily.  08/15/22 appt noted: Current psych meds: Wellbutrin XL 450 mg AM, fluvoxamine 100 mg in AM and 300 mg HS, lorazepam 1 mg 1-2 mg in the AM and HS and 1 tablet prn midday for anxiety, temazepam 30 mg HS Patient received Spravato 84 mg today.  She tolerated it well without any unusual headache, nausea or vomiting or other somatic symptoms.  Dissociation did occur and she gradually saw resolution over the 2-hour period of observation.  She does not typically find the dissociation very strong. No SE complaints with meds. She has a great deal of stress dealing with her family.  Disc brother's ongoing mania and difficulty getting him help and the stress he causes for the family. She wants to continue Spravato through this very stressful holdicay season and reevaluate the frequency after the New Year.  09/12/22 appt noted: Current psych meds: Wellbutrin XL 450 mg AM, fluvoxamine 100 mg in AM and 300 mg HS, lorazepam 1 mg 1-2 mg in the AM and HS and 1 tablet prn midday for anxiety, temazepam 30 mg HS Patient received Spravato 84 mg today.  She tolerated it well without any unusual headache, nausea or vomiting or other somatic symptoms.  Dissociation did occur and she gradually saw resolution over the 2-hour period of observation.  She does not typically find the dissociation very strong. No SE complaints with meds. She has a great deal of stress dealing with her family.  Disc brother's  ongoing mania and difficulty getting him help and the stress he causes for the family. She wants to continue Spravato through this very stressful holdicay season and  reevaluate the frequency after the New Year.  Chronically easily overwhelmed with family. Complaining of HA and history migraine.  Asks for increase imitrex and disc preventatives like propranolol ER  09/26/22 appt noted: Current psych meds: Wellbutrin XL 450 mg AM, fluvoxamine 100 mg in AM and 300 mg HS, lorazepam 1 mg 1-2 mg in the AM and HS and 1 tablet prn midday for anxiety, temazepam 30 mg HS Patient received Spravato 84 mg today.  She tolerated it well without any unusual headache, nausea or vomiting or other somatic symptoms.  Dissociation did occur and she gradually saw resolution over the 2-hour period of observation.  She does not typically find the dissociation very strong. No SE complaints with meds. She has a great deal of stress dealing with her family.  This is ongoing The holidays are much more stressful DT family problems.  She is noting OCD is much worse over the last couple of week.  Depression is better with Spravato. Needed higher dose meds for migraine.   10/03/22 appt noted: Current psych meds: Wellbutrin XL 450 mg AM, fluvoxamine 100 mg in AM and 300 mg HS, lorazepam 1 mg 1-2 mg in the AM and HS and 1 tablet prn midday for anxiety, temazepam 30 mg HS Patient received Spravato 84 mg today.  She tolerated it well without any unusual headache, nausea or vomiting or other somatic symptoms.  Dissociation did occur and she gradually saw resolution over the 2-hour period of observation.  She does not typically find the dissociation very strong. No SE complaints with meds. She has now realized that the rash she previously previously attributed to [facility] was not related.  She is interested may be retrying that after the holidays.  She is tolerating medications otherwise. The holidays remain chronically stressful to her due to family dynamic problems which cause her to consistently feel stuck.  Under more stress her OCD is worse.  She will have a tendency to have crying spells.  The  depression and OCD are still improved with Spravato as compared to before.  11/15/22 appt noted: Current psych meds: Wellbutrin XL 450 mg AM, fluvoxamine 100 mg in AM and 300 mg HS, lorazepam 1 mg 1-2 mg in the AM and HS and 1 tablet prn midday for anxiety, temazepam 30 mg HS Patient received Spravato 84 mg today.  She tolerated it well without any unusual headache, nausea or vomiting or other somatic symptoms.  Dissociation did occur and she gradually saw resolution over the 2-hour period of observation.  She does not typically find the dissociation very strong. No SE complaints with meds. Continues to feel depressed and overwhelmed by family problems including her brother's mania and recent eviction and commitment.  Chronic OCD worse when stressed.  No SI.  Tolerating meds. Plan: Per her request continue Wellbutrin XL 450 mg every morning. She has come to the realization that the rash she had previously attributed to [facility] was not related.  She is interested in perhaps retrying Auvelity. There are few alternative medication options that remain.  01/11/23 appt noted: Current psych meds: Wellbutrin XL 150 mg BID and started Auvelity 1 AM, fluvoxamine 100 mg in AM and 300 mg HS, lorazepam 1 mg 1-2 mg in the AM and HS and 1 tablet prn midday for anxiety, temazepam 30 mg HS Patient received  Spravato 84 mg today.  She tolerated it well without any unusual headache, nausea or vomiting or other somatic symptoms.  Dissociation did occur and she gradually saw resolution over the 2-hour period of observation.  She does not typically find the dissociation very strong. No SE complaints with meds. Trouble getting to sessions lately DT transportation problems.   Continues to feel depressed and overwhelmed by OCD and family.  Feels she needs toevery other week Spravato bc it helps for a couple of weeks and then seems to wear off.  Struggleing with OCD and depression both of which are eased by Spravato. Less  crying with Auvelity.  02/07/23 appt noted: Current psych meds: Wellbutrin XL 150 mg BID and started Auvelity 1 AM, fluvoxamine 100 mg in AM and 300 mg HS, lorazepam 1 mg 1-2 mg in the AM and HS and 1 tablet prn midday for anxiety, temazepam 30 mg HS Patient received Spravato 84 mg today.  She tolerated it well without any unusual headache, nausea or vomiting or other somatic symptoms.  Dissociation did occur and she gradually saw resolution over the 2-hour period of observation.  She does not typically find the dissociation very strong. No SE complaints with meds. Trouble getting to sessions lately DT transportation problems.  This is a problem ongoing and thinks she might need to pause Spravato bc won't be able to get her for at least 3 weeks. She is holding pretty steady with a moderate level of anxiety and depression ongoing and chronic.    03/20/23 appt noted: Current psych meds: Wellbutrin XL 150 mg BID and started Auvelity 1 AM, fluvoxamine 100 mg in AM and 300 mg HS, lorazepam 1 mg 1-2 mg in the AM and HS and 1 tablet prn midday for anxiety, temazepam 30 mg HS Patient received Spravato 84 mg today.  She tolerated it well without any unusual headache, nausea or vomiting or other somatic symptoms.  Dissociation did occur and she gradually saw resolution over the 2-hour period of observation.  She does not typically find the dissociation very strong. No SE complaints with meds. She is trying to get back into more regular Spravato administration.  Family issues and transportation problems that led to her missing Spravato.  She feels more depressed without regular Spravato.  Her OCD is chronic but also worse when she misses Spravato.  She does not want any medication changes.  04/03/23 appt noted: Current psych meds: Wellbutrin XL 150 mg BID and started Auvelity 1 AM, fluvoxamine 100 mg in AM and 300 mg HS, lorazepam 1 mg 1-2 mg in the AM and HS and 1 tablet prn midday for anxiety, temazepam 30 mg  HS Patient received Spravato 84 mg today.  She tolerated it well without any unusual headache, nausea or vomiting or other somatic symptoms.  Dissociation did occur and she gradually saw resolution over the 2-hour period of observation.  She does not typically find the dissociation very strong.  It gets rid of negative emotion for awhile after procedure and would like it to last longer.   No SE complaints with meds.  Doesn't really want med changes.   Planning to weekly Spravato.  It is helping dep and anxiety.    04/10/23 appt noted: Current psych meds: Wellbutrin XL 150 mg BID and started Auvelity 1 AM, fluvoxamine 100 mg in AM and 300 mg HS, lorazepam 1 mg 1-2 mg in the AM and HS and 1 tablet prn midday for anxiety, temazepam 30 mg HS Patient  received Spravato 84 mg today.  She tolerated it well without any unusual headache, nausea or vomiting or other somatic symptoms.  Dissociation did occur and she gradually saw resolution over the 2-hour period of observation.  She does not typically find the dissociation very strong.  It gets rid of negative emotion for awhile after procedure and would like it to last longer.   No SE complaints with meds.  Doesn't really want med changes.   Had lidocaine shot for back pain with brief benefit.  Doing some water based PT Spravato went well today. Got pretty upset last week with event related to son's activities.  Got upset with son saying something inappropriate publicly.  Was so embarrassed.  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    He's kind of rebellious lately.  Son is 12 yo.    05/03/23 appt noted: Current psych meds: Wellbutrin XL 150 mg BID and started Auvelity 1 AM, fluvoxamine 100 mg in AM and 300 mg HS, lorazepam 1 mg 1-2 mg in the AM and HS and 1 tablet prn midday for anxiety, temazepam 30 mg HS No SE Patient received Spravato 84 mg today.  She tolerated it well without any unusual headache, nausea or  vomiting or other somatic symptoms.  Dissociation did occur and she gradually saw resolution over the 2-hour period of observation.  She does not typically find the dissociation very strong.  It gets rid of negative emotion for awhile after procedure and would like it to last longer.   Still pleased with benefit from [facility] for mood and anxiety. No SE complaints with meds.  Doesn't really want med changes.   Chronic family px ongoing affects her but nothing she can change.H sick of the family drama.   Continuing counseling helps.  Has some support. Mood and anxiety pretty steady but did go on vacation to [place].  Anxiety worse first in AM and then later at night with mind racing on stressors.   Still back trouble.  05/23/23 appt noted: Current psych meds: Wellbutrin XL 150 mg BID and started Auvelity 1 AM, fluvoxamine 100 mg in AM and 300 mg HS, lorazepam 1 mg 1-2 mg in the AM and HS and 1 tablet prn midday for anxiety, temazepam 30 mg HS No SE Patient received Spravato 84 mg today.  She tolerated it well without any unusual headache, nausea or vomiting or other somatic symptoms.  Dissociation did occur and she gradually saw resolution over the 2-hour period of observation.  She does not typically find the dissociation very strong.  It gets rid of negative emotion for awhile after procedure and would like it to last longer.   Still pleased with benefit from [facility] for mood and anxiety by 50%. No SE complaints with meds. Chronic family stress interferes with mental health and self care.  May have to reduce frequency of Spravato bc of this. But is status quo with meds.  Chronic residual OCD which is mod severe and dep moderate.  05/30/23 appt noted: Current psych meds: Wellbutrin XL 150 mg BID and started Auvelity 1 AM, fluvoxamine 100 mg in AM and 300 mg HS, lorazepam 1 mg 1-2 mg in the AM and HS and 1 tablet prn midday for anxiety, temazepam 30 mg HS No SE Patient received Spravato 84 mg  today.  She tolerated it well without any unusual headache, nausea or vomiting or other somatic symptoms.  Dissociation did  occur and she gradually saw resolution over the 2-hour period of observation.  She does not typically find the dissociation very strong.  It gets rid of negative emotion for awhile after procedure and would like it to last longer.   Still pleased with benefit from [facility] for mood and anxiety by 50% or better but not resolved. She wants to continue Spravato as frequently as schedule will allow. Both depression and OCD are better with most improvement in mood.  Asks about any new treatments for OCD. No SE complaints with meds. Chronic family stress interferes with mental health and self care.  This is an ongoing drain on her mood and resources emotionally.    06/06/23 appt noted: Current psych meds: Wellbutrin XL 150 mg BID and started Auvelity 1 AM, fluvoxamine 100 mg in AM and 300 mg HS, lorazepam 1 mg 1-2 mg in the AM and HS and 1 tablet prn midday for anxiety, temazepam 30 mg HS No SE Patient received Spravato 84 mg today.  She tolerated it well without any unusual headache, nausea or vomiting or other somatic symptoms.  Dissociation did occur and she gradually saw resolution over the 2-hour period of observation.  She does not typically find the dissociation very strong.  It gets rid of negative emotion for awhile after procedure and would like it to last longer.   Still believes each meds work and are helpful and well tolerated.  Specifically she thinks that the Auvelity adds additional benefit on taking Wellbutrin.  She believes the meds help both depression and OCD though the OCD remains a problem.  She also believes Spravato helps both types of symptoms as well.  Her mood is variable.  She experiences a great deal of stress from her family and those problems wax and wane.  She has bad days because of it and today is 1 of those days.  She is continuing counseling and that is  helpful.  Due to family demands she may have to spread out the Spravato frequency.  06/16/23 appt noted:  Current psych meds: Wellbutrin XL 150 mg BID and started Auvelity 1 AM, fluvoxamine 100 mg in AM and 300 mg HS, lorazepam 1 mg 1-2 mg in the AM and HS and 1 tablet prn midday for anxiety, temazepam 30 mg HS No SE Patient received Spravato 84 mg today.  She tolerated it well without any unusual headache, nausea or vomiting or other somatic symptoms.  Dissociation did occur and she gradually saw resolution over the 2-hour period of observation.  She does not typically find the dissociation very strong.  It gets rid of negative emotion for awhile after procedure . She is trying to make the Schedule work for [employer] oh every week or every other week because she is aware the treatment effects can be lost if it is time less frequently.  So she has been able to come the last 2 weeks, we will try to come in 2 weeks.  Spravato reduces depression and OCD significantly though she remains highly symptomatic with both.  However she has no suicidal thoughts.  Crying spells are reduced with Spravato.  She still spends a fair amount of time meaning over an hour a day checking and more under stress or when tired.  She asks about any new meds for OCD. Plan: increase Auvelity 1 in AM & PM Hold Wellbutrin XL 150 mg BID  07/12/23 appt noted: Current psych meds: Wellbutrin XL 150 mg BID and started Auvelity 1 AM,  fluvoxamine 100 mg in AM and 300 mg HS, lorazepam 1 mg 1-2 mg in the AM and HS and 1 tablet prn midday for anxiety, temazepam 30 mg HS.  Didn't increase Auvelity yet No SE Patient received Spravato 84 mg today.  She tolerated it well without any unusual headache, nausea or vomiting or other somatic symptoms.  Dissociation did occur and she gradually saw resolution over the 2-hour period of observation.  She does not typically find the dissociation very strong.  It gets rid of negative emotion for awhile after  procedure . She has not increased the Auvelity yet is planned.  She has a little bit of nervousness about it but admits is not rational.  She is willing to give that a try to try to get better control of depression and anxiety.  She continues to see the benefit of Spravato every other week.  She is struggling with some pain due to plantar fasciitis and given her cerebral palsy that makes it even more difficult to walk and to engage in normal activities.  She is willing to seek treatment for that.  07/25/23 appt noted: Current psych meds: Wellbutrin XL 150 mg AM and started Auvelity 1 BID, fluvoxamine 100 mg in AM and 300 mg HS, lorazepam 1 mg 1-2 mg in the AM and HS and 1 tablet prn midday for anxiety, temazepam 30 mg HS.  Didn't increase Auvelity yet No SE Patient received Spravato 84 mg today.  She tolerated it well without any unusual headache, nausea or vomiting or other somatic symptoms.  Dissociation did occur and she gradually saw resolution over the 2-hour period of observation.  She does not typically find the dissociation very strong.  It gets rid of negative emotion for awhile after procedure . Chronic dep and anxiety to some extent.  But has increased Auvelity to BID recently and no SE yet.  Disc frequency Spravato bc benefit and will try to continue every other week and not less if possible. No further concerns for meds.  Previous psych med trials include Prozac, paroxetine, sertraline, fluvoxamine, venlafaxine, Anafranil with no response,  Wellbutrin, , Viibryd, Trintellix 10 1 month NR Auvelity BID  Geodon,  risperidone, Rexulti, Abilify,  Seroquel, Latuda 40 mg with irritability.  lamotrigine lithium,  BuSpar, Namenda,  pramipexole with no response, and Topamax, pindolol  ECT-MADRS    Flowsheet Row Office Visit from 06/29/2021 in [facility]  MADRS Total Score 36      Flowsheet Row Admission (Discharged) from 06/11/2021 in [facility]  C-SSRS RISK CATEGORY No Risk        Review of Systems:  Review of Systems  Constitutional:  Positive for fatigue.  Cardiovascular:  Negative for palpitations.  Musculoskeletal:  Positive for arthralgias, back pain, gait problem, myalgias and neck pain.  Neurological:  Positive for weakness and headaches. Negative for tremors.  Psychiatric/Behavioral:  Positive for dysphoric mood and sleep disturbance. Negative for agitation and suicidal ideas. The patient is nervous/anxious.     Medications: I have reviewed the patient's current medications.  Current Outpatient Medications  Medication Sig Dispense Refill   Abaloparatide (TYMLOS) 3120 MCG/1.56ML SOPN Inject into the skin.     Azelastine-Fluticasone 137-50 MCG/ACT SUSP Place 1-2 sprays into both nostrils daily.     baclofen (LIORESAL) 10 MG tablet Take 20 mg by mouth at bedtime as needed for muscle spasms.     Dextromethorphan-buPROPion ER (AUVELITY) 45-105 MG TBCR Take 1 tablet by mouth in  the morning and at bedtime. (Patient taking differently: Take 1 tablet by mouth in the morning and at bedtime. Only taking 1 daily.  Didn't increase) 60 tablet 1   dicyclomine (BENTYL) 10 MG capsule Take 10 mg by mouth daily.     docusate sodium (COLACE) 100 MG capsule Take 1 capsule (100 mg total) by mouth 2 (two) times daily. (Patient taking differently: Take 100 mg by mouth daily.) 10 capsule 0   Esketamine HCl, 84 MG Dose, (SPRAVATO, 84 MG DOSE,) 28 MG/DEVICE SOPK USE 3 SPRAYS IN EACH NOSTRIL ONCE A WEEK 3 each 0   fexofenadine (ALLEGRA) 180 MG tablet Take 180 mg by mouth daily.     fluvoxaMINE (LUVOX) 100 MG tablet TAKE 1 TABLET IN THE AM AND 3 TABLETS AT NIGHT 360 tablet 0   hydrocortisone (ANUSOL-HC) 2.5 % rectal cream Place rectally 2 (two) times daily. x 7-14 days 30 g 0   ketotifen (ZADITOR) 0.025 % ophthalmic solution Place 3 drops into both eyes at bedtime.     LORazepam (ATIVAN) 1 MG tablet TAKE 1-2 IN THE AM AND 1-2 TABLETS EVERY  NIGHT AT BEDTIME AND 1 TABLET IN AFTERNOON when needed for anxiety and sleep 150 tablet 2   magnesium gluconate (MAGONATE) 500 MG tablet Take 500 mg by mouth daily.     MIBELAS 24 FE 1-20 MG-MCG(24) CHEW Chew 1 tablet by mouth at bedtime as needed (bowel regularity).     Multiple Vitamins-Minerals (ADULT GUMMY PO) Take 2 tablets by mouth in the morning.     nitrofurantoin (MACRODANTIN) 100 MG capsule Take 100 mg by mouth as needed (For urinary tract infection.).      oxyCODONE-acetaminophen (PERCOCET/ROXICET) 5-325 MG tablet Take 1-2 tablets by mouth every 6 (six) hours as needed for severe pain. 50 tablet 0   polyethylene glycol (MIRALAX / GLYCOLAX) packet Take 17 g by mouth daily as needed for mild constipation. 14 each 0   propranolol ER (INDERAL LA) 60 MG 24 hr capsule TAKE 1 CAPSULE BY MOUTH EVERY DAY 30 capsule 5   psyllium (METAMUCIL) 58.6 % powder Take 1 packet by mouth daily as needed (constipation).     SUMAtriptan (IMITREX) 100 MG tablet Take 1 tablet (100 mg total) by mouth every 2 (two) hours as needed for migraine. May repeat in 2 hours if headache persists or recurs. 9 tablet 2   temazepam (RESTORIL) 30 MG capsule TAKE 1 CAPSULE BY MOUTH AT BEDTIME AS NEEDED FOR SLEEP 30 capsule 2   Vitamin D-Vitamin K (VITAMIN K2-VITAMIN D3 PO) Take 1-2 sprays by mouth daily.     No current facility-administered medications for this visit.    Medication Side Effects: None   Allergies:  Allergies  Allergen Reactions   Hydrocodone Itching   Sulfamethoxazole-Trimethoprim Itching   Dust Mite Extract Other (See Comments)    Sneezing, watery eyes, runny nose   Latex Itching   Other Other (See Comments)    PT IS ALLERGIC TO CAT DANDER AND RAGWEED - Sneezing, watery eyes, runny nose    Pollen Extract Other (See Comments)    Sneezing, watery eyes, runny nose     Past Medical History:  Diagnosis Date   Abnormal Pap smear 2011   hpv/mild dysplasia,cin1   Anxiety    Cerebral palsy (HCC)     right arm/leg   Cystocele    Depression    Headache    Neuromuscular disorder (HCC)    Cerebral Palsy   OCD (obsessive compulsive disorder)  Osteoporosis    Uterine prolaps     Family History  Problem Relation Age of Onset   Cancer Father        skin AND LUNG   Alcohol abuse Sister        CRACK COCAINE    Social History   Socioeconomic History   Marital status: Married    Spouse name: Not on file   Number of children: Not on file   Years of education: Not on file   Highest education level: Not on file  Occupational History   Not on file  Tobacco Use   Smoking status: Never   Smokeless tobacco: Never  Substance and Sexual Activity   Alcohol use: Not Currently    Comment: OCCASIONAL beer   Drug use: No   Sexual activity: Yes    Birth control/protection: Pill    Comment: LOESTRIN 24 FE  Other Topics Concern   Not on file  Social History Narrative   Not on file   Social Determinants of Health   Financial Resource Strain: Not on file  Food Insecurity: Not on file  Transportation Needs: Not on file  Physical Activity: Not on file  Stress: Not on file  Social Connections: Not on file  Intimate Partner Violence: Not on file    Past Medical History, Surgical history, Social history, and Family history were reviewed and updated as appropriate.   Please see review of systems for further details on the patient's review from today.   Objective:   Physical Exam:  LMP  (LMP Unknown)   Physical Exam Constitutional:      General: She is not in acute distress. Neurological:     Mental Status: She is alert and oriented to person, place, and time.     Cranial Nerves: No dysarthria.     Motor: Weakness present.     Gait: Gait abnormal.  Psychiatric:        Attention and Perception: Attention and perception normal.        Mood and Affect: Mood is anxious and depressed.        Speech: Speech normal. Speech is not slurred.        Behavior: Behavior normal.  Behavior is cooperative.        Thought Content: Thought content normal. Thought content is not delusional. Thought content does not include homicidal or suicidal ideation. Thought content does not include suicidal plan.        Cognition and Memory: Cognition and memory normal. Cognition is not impaired.        Judgment: Judgment normal.     Comments: Insight intact Ongoing OCD remains fairly severe but less anxious Checking compulsions up to 2 hours daily but improved noticeably with Spravato Chronic depression persistent but better with Spravato  and helps OCD too.        Lab Review:     Component Value Date/Time   NA 138 06/11/2021 0606   K 4.0 06/11/2021 0606   CL 107 06/11/2021 0606   CO2 26 06/11/2021 0606   GLUCOSE 90 06/11/2021 0606   BUN 18 06/11/2021 0606   CREATININE 0.81 06/11/2021 0606   CALCIUM 9.4 06/11/2021 0606   PROT 6.5 06/11/2021 0606   ALBUMIN 3.3 (L) 06/11/2021 0606   AST 17 06/11/2021 0606   ALT 14 06/11/2021 0606   ALKPHOS 141 (H) 06/11/2021 0606   BILITOT 0.2 (L) 06/11/2021 0606   GFRNONAA >60 06/11/2021 0606   GFRAA >60 07/09/2016 0981  Component Value Date/Time   WBC 5.8 06/11/2021 0606   RBC 4.12 06/11/2021 0606   HGB 12.5 06/11/2021 0606   HCT 39.7 06/11/2021 0606   PLT 299 06/11/2021 0606   MCV 96.4 06/11/2021 0606   MCH 30.3 06/11/2021 0606   MCHC 31.5 06/11/2021 0606   RDW 13.9 06/11/2021 0606   LYMPHSABS 1.9 06/11/2021 0606   MONOABS 0.5 06/11/2021 0606   EOSABS 0.1 06/11/2021 0606   BASOSABS 0.0 06/11/2021 0606    No results found for: "POCLITH", "LITHIUM"   No results found for: "PHENYTOIN", "PHENOBARB", "VALPROATE", "CBMZ"   .res Assessment: Plan:    Temia "Casey Diaz" was seen today for follow-up, depression, anxiety and stress.  Diagnoses and all orders for this visit:  Recurrent major depression resistant to treatment [facility])  Mixed obsessional thoughts and acts  Social anxiety disorder  Insomnia due to mental  condition  Migraine without aura and without status migrainosus, not intractable   Both primary Dx of OCD and major depression are TR and marked.  Impaired function but less so with Spravato re: depression..   She is receiving Spravato 84 mg weekly and moderate improvement in the depression..  she feels it also helps OCD somewhat.  However still easily overwhelmed with low stress tolerance.  Family contributes to her anxiety and stress markedly.  The OCD is improved with the increase in fluvoxamine and with Spravato.  Spends up to 2 hours daily and checking compulsions on her worst days but better when she travels.  No new options for tx are evident.  She has been on higher doses of fluvoxamine above the usual max of 400 mg daily in the past and finds it more helpful at the higher dose.    Disc SE.   She is tolerating the meds well  Continue  Luvox back to 400 mg nightly as of January 2023. Disc dosing higher than usual.  She feels this is increase has helped more with OCD which remains chronically severe.   Option increase Auvelity BID and reduce Wellbutrin to 1 AM. But she didn't think it made a lot more difference than 1 Auveltiy daily when taken in summer 2023. Consider trying to increase it again.  We have discussed seizure risk that is possible using this combination but given severity of her symptoms she feels the risk is warranted. There are few other alternative medication options that remain.   Disc DDI issues.   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 She doesn't want to try this now.  Disc Spravato DT TRD incl details and SE. Disc dosing and duration.  Pt with severe depression MADRS 36 on 06/29/21  Patient was administered Spravato 84 mg intranasally dosage today.  The patient experienced the typical dissociation which gradually resolved over the 2-hour period of observation.  There were no complications.  Specifically the patient did not have nausea  or vomiting or headache.  Blood pressures remained within normal ranges at the 40-minute and 2-hour follow-up intervals.  By the time the 2-hour observation period was met the patient was alert and oriented and able to exit without assistance. She tends to have lingering sedative effects but not severe. .  See nursing note for further details. Try to continue every other week Spravato.  Didn't feel as well when off spravato but disc family issues interfering with frequency.  We discussed the short-term risks associated with benzodiazepines including sedation and increased fall risk among others.  Discussed long-term  side effect risk including dependence, potential withdrawal symptoms, and the potential eventual dose-related risk of dementia.  But recent studies from 2020 dispute this association between benzodiazepines and dementia risk. Newer studies in 2020 do not support an association with dementia. Disc this is high dose and not ideal.  Also disc risk combining it with temazepam. Rec try gradually reduce HS lorazepam to 1 mg Hs.  Can continue lorazepam 2 mg AM and 1 mg in afternoon bc of chronic anxiety and it is helpful and tolerated. She can continue temazepam 30 mg nightly.  She tends to have a lot of anxious negative thoughts at night when she is trying to go to bed.  She is trying to reduce the dose.  Complaining of HA and history migraine.  Asks for increase imitrex and disc preventatives like propranolol ER imitrex to 100 mg prn migraine and propranolol ER for migraine prevention.  Supportive therapy dealing with some of the recent stressors including son's autism and recent issues of rebelliousness with him.    continue Auvelity 1 in AM & PM Hold Wellbutrin XL 150 mg BID Fluvoxamine 100 mg AM and 300 mg PM above usual max bc med necessary Lorazepam 2 mg HS and prn 1 mg BID prn anxiety daily Temazepam 30 mg HS Propranolol ER 60 mg daily Imitrex prn migraine  Follow-up every week if  possible and if note every other week and try to be consistent. That is her plan but family demands do interfere at times.     Meredith Staggers, MD, DFAPA  Please see After Visit Summary for patient specific instructions.  Future Appointments  Date Time Provider Department Center  08/01/2023 10:00 AM [facility]  08/16/2023  6:00 PM Robley Fries, PhD [facility] None  08/30/2023  6:00 PM Robley Fries, PhD [facility] None  09/20/2023  6:00 PM Mitchum, Molly Maduro, PhD [facility] None    No orders of the defined types were placed in this encounter.    -------------------------------

## 2023-08-01 ENCOUNTER — Ambulatory Visit: Payer: Self-pay | Admitting: Psychiatry

## 2023-08-01 ENCOUNTER — Other Ambulatory Visit: Payer: 59

## 2023-08-02 ENCOUNTER — Telehealth: Payer: Self-pay | Admitting: Psychiatry

## 2023-08-02 ENCOUNTER — Other Ambulatory Visit: Payer: Self-pay

## 2023-08-02 DIAGNOSIS — F5105 Insomnia due to other mental disorder: Secondary | ICD-10-CM

## 2023-08-02 NOTE — Telephone Encounter (Signed)
Pended.

## 2023-08-02 NOTE — Telephone Encounter (Signed)
Patient called in for refill on Temazepam 300mg . States she has two pills left. Ph: [phone] Pharmacy [facility] [address] [place]

## 2023-08-03 MED ORDER — TEMAZEPAM 30 MG PO CAPS: ORAL_CAPSULE | ORAL | 1 refills | Status: DC

## 2023-08-08 ENCOUNTER — Ambulatory Visit (INDEPENDENT_AMBULATORY_CARE_PROVIDER_SITE_OTHER): Payer: 59 | Admitting: Psychiatry

## 2023-08-08 ENCOUNTER — Ambulatory Visit: Payer: 59

## 2023-08-08 ENCOUNTER — Encounter: Payer: Self-pay | Admitting: Psychiatry

## 2023-08-08 VITALS — BP 128/90 | HR 81

## 2023-08-08 DIAGNOSIS — F4010 Social phobia, unspecified: Secondary | ICD-10-CM

## 2023-08-08 DIAGNOSIS — F339 Major depressive disorder, recurrent, unspecified: Secondary | ICD-10-CM

## 2023-08-08 DIAGNOSIS — F422 Mixed obsessional thoughts and acts: Secondary | ICD-10-CM | POA: Diagnosis not present

## 2023-08-08 DIAGNOSIS — F5105 Insomnia due to other mental disorder: Secondary | ICD-10-CM

## 2023-08-08 DIAGNOSIS — G43009 Migraine without aura, not intractable, without status migrainosus: Secondary | ICD-10-CM

## 2023-08-08 DIAGNOSIS — Z636 Dependent relative needing care at home: Secondary | ICD-10-CM

## 2023-08-08 NOTE — Progress Notes (Signed)
Casey Diaz [id number] 1966/07/03 57 y.o.    Subjective:   Patient ID:  Casey Diaz is a 57 y.o. (DOB [birth year]) female.  Chief Complaint:  Chief Complaint  Patient presents with   Follow-up   Depression   Anxiety   Stress   Sleeping Problem     HPI Casey Diaz presents to the office today for follow-up of OCD and severe anxiety.     December 2019 visit the following was noted: No meds were changed. Lives in [name] and back for followup.  Sx are about the same.  Has to take meds with different sizes. Pt reports that mood is Anxious and Depressed and describes anxiety as Severe. Anxiety symptoms include: Excessive Worry, Obsessive Compulsive Symptoms:   Checking,,. Pt reports has interrupted sleep and nocturia. Pt reports that appetite is good. Pt reports that energy is no change and down slightly. Concentration is down slightly. Suicidal thoughts:  denied by patient. Loves the environment of [name] but misses some things there.  She's not able to work there.  H works there and likes it.  Struggled with not working, feels isolated and not up to task of meeting people.  Does attend a church and met a friend who's been helpful.  Leaving for [name] on 10/16/18.   04/09/2020 appointment the following is noted:  Staying another year in [name] bc Covid and other things. Last few months a lot of crying spells.  Is in menopause. Wonders about med changes though is nervous about it.  Crying spells associated with depressing thoughts more than stress or OCD.   Covid really hard on everyone and couldn't see family for 18 mos.  Family still very dysfunctional. No close friends in part due to OCD and depression. Son high Autism spectrum with ADHD and anxiety and she's with him all the time. Greater health problems with CP so more pains.   05/15/20 appt with the following noted: [name] for menopause and helps some. Still depressed.  Chronically. In [place] for 2 more weeks then  to [name] for another year. A lot of stressors lately triggering more checking and anxiety.   OCD is her CC now and seems.  Got worse DT stress.   Stressed with Asberger's son and her health.  H works a lot.  Her FOO still stress. Plan: Trintellix 10 mg 1 tablet in the morning with food and reduce fluvoxamine to 5 tablets nightly for 1 week  then reduce it to 4 tablets nightly.   07/02/20 appt with the following noted: Decided not to get Trintellix bc difficulty getting it. It is available.  There.  Wants to start it now.   Both depression and OCD are severe.  Not suicidal in intent or plan. Did not take samples with her to [name] but will be back in December. covid is worse there and travel is difficult.  Wants to reduce Wellbutrin DT dry mouth. Plan: She's afraid to reduce Luvox at this time DT fear of worsening OCD.  But will consider. Trintellix 10 mg 1 tablet in the morning with food and reduce fluvoxamine to 5 tablets nightly for 1 week  then reduce it to 4 tablets nightly. Also reduce Wellbutrin XL to 300 mg daily.    9-13 2022 appointment with the following noted: Back in [place] since July 14.  Broke arm a month ago and surgery.  It's all been rough adjustment.   B has cancer on his face and M fell taking him  to the doctor.  Misses the water and weather of [name].   Cry a lot more since menopause. Still depression and anxiety and OCD.  Asks about ketamine. On Wellbutrin 300, Luvox 300.  No Trintellix. Added Ativan 2 mg AM and HS and it helps.  More likely to get upset at night. Plan: Increase Luvox back to 400 mg daily.  She thinks she's worse on less. Continue Wellbutrin XL to 300 mg daily. Plan to start Spravato for TRD asap   09/27/2021 appointment with the following noted:  She has started Spravato today at 55 mg intranasally.  She tolerated it well without unusual nausea or vomiting headache or other somatic symptoms.  She did have the expected dissociation which gradually  resolved over the course of the 2-hour period of observation.  She was a little concerned about her balance given her cerebral palsy but has not noted unusual or unexpected problems.  She is motivated to can continue Spravato in hopes of reducing her depressive symptoms. 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  She also has treatment resistant OCD which is partially managed with medications but is still quite disabling.  She is tolerating the medications well.  She is sleeping adequately.  Her appetite is adequate.  She is not having suicidal thoughts.  She continues to wish for a better treatment for OCD that would give her some relief.  09/30/2021 appointment with the following noted: She received her first dose of Spravato 84 mg intranasally today.  She tolerated it well without unusual nausea, vomiting, or other somatic symptoms.  Dissociation as expected did occur and gradually resolved over the 2-hour period of observation.  She did have a mild headache today with the treatment and received ibuprofen 600 mg at her request.  We will follow this to see if it is a pattern Patient is still depressed.  She said she was late with her medicine today and today was a particularly depressing day.  However she notes that the Spravato has lifted her mood considerably even today.  She is hopeful that it will continue to be helpful.  No suicidal thoughts.  She has ongoing chronic anxiety and OCD at baseline.  10/04/21 appt noted: Patient received Spravato 84 mg for the second time today.  She tolerated it well without any unusual headache, nausea or vomiting or other somatic symptoms.  Dissociation did occur and she gradually [place] resolution over the 2-hour period of observation. She did not have any unusual problems after she left the office last Spravato administration.  She did not have any specific problems with balance or walking.  She is at increased risk of that  difficulty because of cerebral palsy.  So far she has not noticed much mood effect from the medication beyond the first day of receiving it.  However she would like to continue Spravato in hopes of getting the antidepressant effect that is desired. Stress dealing with mother's behavior at party pt hosted.  Guilt over it.  10/07/2021 appointment noted: Patient received Spravato 84 mg for the second time today.  She tolerated it well without any unusual headache, nausea or vomiting or other somatic symptoms.  Dissociation did occur and she gradually [place] resolution over the 2-hour period of observation. She still is not sure about the antidepressant effect of Spravato.  Events over the holidays and demands, make it difficult to assess.  She still notes that the OCD tends to worsen the depression and vice versa.  She tolerates the Spravato well and wants to continue the trial.  10/15/2021 appointment with the following noted: Patient received Spravato 84 mg for the second time today.  She tolerated it well without any unusual headache, nausea or vomiting or other somatic symptoms.  Dissociation did occur and she gradually [place] resolution over the 2-hour period of observation. Patient says it was somewhat difficult to evaluate the effect of the Spravato.  It was scheduled to be twice weekly for 4 weeks consecutively but the holidays have interfered with that administration.  She asked what specifically should be she should be looking for in order to assess improvement.  That was discussed.  The OCD is unchanged and the depression so far is not significantly different.  She still tolerates meds.  There have been no recent med changes  10/19/2021 appt noted: Patient received Spravato 84 mg for the second today.  She tolerated it well without any unusual headache, nausea or vomiting or other somatic symptoms.  Dissociation did occur and she gradually saw resolution over the 2-hour period of observation.    10/21/2021 appointment noted: Patient received Spravato 84 mg today.  She tolerated it well without any unusual headache, nausea or vomiting or other somatic symptoms.  Dissociation did occur and she gradually saw resolution over the 2-hour period of observation.  She feels better than last week.  She is not as depressed and down.  She is still dealing with grief around the death of her cousin that was unexpected.  It is still difficult to tell how much the Spravato was doing but she is hopeful.  Anxiety is still present with the OCD.  She is not having suicidal thoughts.  She is not hopeless.  She wants to continue treatment.  10/25/2021 appointment with the following noted: Patient received Spravato 84 mg today.  She tolerated it well without any unusual headache, nausea or vomiting or other somatic symptoms.  Dissociation did occur and she gradually saw resolution over the 2-hour period of observation.  She does not typically find the dissociation very strong. She is beginning to think the Spravato is helping somewhat with the depression.  It has been difficult to tell with the holidays intervening as well as the death of her cousin.  She has not been able to get Spravato twice weekly for 4 weeks straight as typically planned.  However she is hopeful.  The OCD remains significant.  She still has a tendency to think very negatively.  She is not suicidal.  10/28/2021 appointment with the following noted: Patient received Spravato 84 mg today.  She tolerated it well without any unusual headache, nausea or vomiting or other somatic symptoms.  Dissociation did occur and she gradually saw resolution over the 2-hour period of observation.  She does not typically find the dissociation very strong. She is feeling more hopeful about the administration of Spravato.  She is having less depression she believes.  Still not dramatically different.  She still has a tendency to have a lot of anxiety and rumination and  OCD.  She is not suicidal.  She is eager to continue the Spravato.  11/01/2021 appointment with the following noted: Patient received Spravato 84 mg today.  She tolerated it well without any unusual headache, nausea or vomiting or other somatic symptoms.  Dissociation did occur and she gradually saw resolution over the 2-hour period of observation.  She does not typically find the dissociation very strong. She is continuing to see a little bit of  improvement in depression with Spravato.  The anxiety remains but may be not as severe.  The OCD remains markedly severe chronically.  She is not suicidal.  She is encouraged by the degree of improvement with Spravato and inability to enjoy things more and not be quite as ruminative.  11/04/2021 appt noted: Patient received Spravato 84 mg today.  She tolerated it well without any unusual headache, nausea or vomiting or other somatic symptoms.  Dissociation did occur and she gradually saw resolution over the 2-hour period of observation.  She does not typically find the dissociation very strong. No SE complaints with meds. She continues to feel hopeful about the Spravato.  She has less depression.  Because of a number of factors she is uncertain of the full benefit but thinks she is somewhat less depressed.  Her anxiety and OCD remain significant but a little better.  She is tolerating the medications and does not desire medicine change.  She is not currently complaining of insomnia.   11/08/2021 appointment the following noted: Patient received Spravato 84 mg today.  She tolerated it well without any unusual headache, nausea or vomiting or other somatic symptoms.  Dissociation did occur and she gradually saw resolution over the 2-hour period of observation.  She does not typically find the dissociation very strong. No SE complaints with meds. She feels the Spravato is helping somewhat.  She would like to see a greater effect.  However she is able to enjoy things.   She is productive at home.  She would like to see a lifting of a degree of sadness that remains.  The anxiety and OCD remained largely unchanged.  She wondered about the dosing of Wellbutrin 300 mg a day and Luvox 300 mg a day and possible increases.  She has been at higher doses in the past.  She plans to start water therapy for her weakness and for her shoulder.  11/11/2021 appointment with the following noted: Patient received Spravato 84 mg today.  She tolerated it well without any unusual headache, nausea or vomiting or other somatic symptoms.  Dissociation did occur and she gradually saw resolution over the 2-hour period of observation.  She does not typically find the dissociation very strong. No SE complaints with meds. She feels the Spravato is clearly helping the depression.  She would like to see a more significant effect.  She is still having trouble thinking positive. Her energy is fair.  Concentration is good except for the problem with chronic obsessions. She has been taking Wellbutrin 300 mg in Luvox 300 mg for quite some time but has taken higher doses in the past.  We discussed that.  She would like to try higher doses in order to get a better effect if possible. We just increased the doses a couple of days ago.  No effect yet.  11/15/2021 appointment with the following noted: Patient received Spravato 84 mg today.  She tolerated it well without any unusual headache, nausea or vomiting or other somatic symptoms.  Dissociation did occur and she gradually saw resolution over the 2-hour period of observation.  She does not typically find the dissociation very strong. No SE complaints with meds. The patient is now convinced that the Spravato is helping the depression.  She would like to continue twice weekly Spravato this week if possible.  She has tolerated the increase in Wellbutrin to 450 mg daily and the increase and fluvoxamine to 400 mg daily without complications thus far.  The OCD  and anxiety feed the depression to some extent. She spends approximately 2 hours daily with checking compulsions due to obsessions about causing harm to others.  For example fearing that when she has hit a pot hole that she may have hit a person and going back to check.  Checking corners and rooms out of fear that she may have harmed someone.  Other various checking compulsions.  She is hoping the increase in fluvoxamine to 400 mg will reduce that over the weeks to come.  She is not seeing a significant difference with the addition of the Spravato though she understands that was not expected.  She is more productive at home and more motivated and able to enjoy things more fully as a result of the Spravato treatment.  She is tolerating the medication  11/18/2021 appointment with the following noted: Patient received Spravato 84 mg today.  She tolerated it well without any unusual headache, nausea or vomiting or other somatic symptoms.  Dissociation did occur and she gradually saw resolution over the 2-hour period of observation.  She does not typically find the dissociation very strong. No SE complaints with meds. She clearly believes the Spravato has been helpful for the depression.  She wonders whether to continue to treatments weekly or to cut back to 1 weekly.  She would like to continue twice weekly in hopes of getting additional improvement in the depression because it is not resolved but it is difficult to get here twice a week in terms of arranging rides. She is recently increased Wellbutrin XL to 450 mg daily and fluvoxamine to 400 mg daily but they have not had time to have an official effect.  She is tolerating that well.  She is tolerating meds overwork overall well. The OCD remains the same as noted on 11/15/2021  11/25/21 appt noted: Patient received Spravato 84 mg today.  She tolerated it well without any unusual headache, nausea or vomiting or other somatic symptoms.  Dissociation did occur and  she gradually saw resolution over the 2-hour period of observation.  She does not typically find the dissociation very strong. No SE complaints with meds. She thinks the increase in Wellbutrin and Luvox have been potentially helpful for depression and OCD respectively.  It has been too early to see the full effect.  She is sleeping and eating well.  She is functioning at home.  She still spends a lot of time that is about 2 hours a day dealing with compulsive behaviors.  12/02/21 appt noted: Patient received Spravato 84 mg today.  She tolerated it well without any unusual headache, nausea or vomiting or other somatic symptoms.  Dissociation did occur and she gradually saw resolution over the 2-hour period of observation.  She does not typically find the dissociation very strong. No SE complaints with meds. Several losses and stressors recently that affect her sense of mood. However still sees significant benefit from the Spravato for her depression.  Wants to continue it. Suspect early  some benefit from the increased Wellbutrin for depression and Luvox for OCD. Tolerating meds. No complaints about the meds. Sleeping and eating well.  No new health concerns.  12/09/21 appt noted: Patient received Spravato 84 mg today.  She tolerated it well without any unusual headache, nausea or vomiting or other somatic symptoms.  Dissociation did occur and she gradually saw resolution over the 2-hour period of observation.  She does not typically find the dissociation very strong. No SE complaints with meds. Seeing noticeable  improvement from increase fluvoxamine to 400 mg daily.  Tolerating meds without concerns over them. Depression is stable with residual sx of easy guilt and easily stressed.  OCD contributes to depression but depression is not severe with less crying spells.  Productive at home with chores.  Enjoyed recent birthday.  Sleeping good. No new concerns.  12/23/2021 appointment noted: Patient  received Spravato 84 mg today.  She tolerated it well without any unusual headache, nausea or vomiting or other somatic symptoms.  Dissociation did occur and she gradually saw resolution over the 2-hour period of observation.  She does not typically find the dissociation very strong. No SE complaints with meds. Seeing noticeable improvement from increase fluvoxamine to 400 mg daily.  Tolerating meds without concerns over them. Her depression is somewhat improved with the Spravato.  She also feels generally a little lighter.  She is more motivated.  She is less overwhelmed by guilt.  The OCD is gradually improving but is still quite time-consuming as noted before.  She is sleeping well.  No side effects  12/30/2021 appointment with the following noted: Patient received Spravato 84 mg today.  She tolerated it well without any unusual headache, nausea or vomiting or other somatic symptoms.  Dissociation did occur and she gradually saw resolution over the 2-hour period of observation.  She does not typically find the dissociation very strong. No SE complaints with meds. Seeing noticeable improvement from increase fluvoxamine to 400 mg daily.  Tolerating meds without concerns over them. She is confident of her the improvement seen with Spravato.  She is less hopeless.  Guilt is marked remarkably improved.  She is not having any thoughts of death or dying.  She is more motivated for activities such as exercise which she is recently started.  She is sleeping well. The OCD remains severe but it is improving somewhat with the increase in fluvoxamine.  It is still consuming a couple hours per day.  01/10/22 apravato 84 admin  01/24/22 appt noted: Patient received Spravato 84 mg today.  She tolerated it well without any unusual headache, nausea or vomiting or other somatic symptoms.  Dissociation did occur and she gradually saw resolution over the 2-hour period of observation.  She does not typically find the  dissociation very strong. No SE complaints with meds. Very tearful today.  Feels like she has been suppressing emotion in the Spravato caused it to be released.  Discussed some stressors.  Overall still feels the medicine is helpful.  She has missed some of the scheduled Spravato treatments that were intended to be weekly due to circumstances beyond her control.  She is still struggling with OCD as previously noted but does believe the medications are helpful. Plan no med changes  01/31/2022 received Spravato 84 mg today  02/09/2022 appointment with the following noted: Patient received Spravato 84 mg today.  She tolerated it well without any unusual headache, nausea or vomiting or other somatic symptoms.  Dissociation did occur and she gradually saw resolution over the 2-hour period of observation.  She does not typically find the dissociation very strong. No SE complaints with meds. Spravato clearly helps depression and OCD but easily gets overwhelmed and tearful with fairly routine stressors.  Tolerating meds. Sleep and appetite is OK Asks to increase lorazepam to 2 mg AM and HS and 1mg  afternoon  02/16/22 appt noted: Patient received Spravato 84 mg today.  She tolerated it well without any unusual headache, nausea or vomiting or other somatic symptoms.  Dissociation did occur and she gradually saw resolution over the 2-hour period of observation.  She does not typically find the dissociation very strong. No SE complaints with meds. She has chronic depesssion and OCD but is improved with Spravato, both dx versus before.  She has continued Luvox 400 mg and Wellbutrin 450 mg and is tolerating it.  Chronically easily stressed.  Tolerating all meds.  Doesn't like taking more meds.  Spending a couple hours daily with OCD.  No SI No med changes.  02/21/22 appt noted:   Doesn't like taking more meds.  Spending a couple hours daily with OCD.  No SI No med changes.  02/21/22 appt noted: Patient received  Spravato 84 mg today.  She tolerated it well without any unusual headache, nausea or vomiting or other somatic symptoms.  Dissociation did occur and she gradually saw resolution over the 2-hour period of observation.  She does not typically find the dissociation very strong. No SE complaints with meds. She has chronic depesssion and OCD but is improved with Spravato, both dx versus before.  She has continued Luvox 400 mg and Wellbutrin 450 mg and is tolerating it.  Chronically easily overwhelmed and doesn't know why.  Tolerating all meds. Wants to continue meds.  03/16/22 appt noted: Patient received Spravato 84 mg today.  She tolerated it well without any unusual headache, nausea or vomiting or other somatic symptoms.  Dissociation did occur and she gradually saw resolution over the 2-hour period of observation.  She does not typically find the dissociation very strong. No SE complaints with meds. Overall she still feels the Spravato has been helpful not only for her depression but also for her OCD which was somewhat unexpected.  OCD is still significant but it is less severe than prior to starting Spravato.  She is tolerating Luvox 400 mg and Wellbutrin 450 mg.  We discussed possible med adjustments.  03/23/22 appt noted: Patient received Spravato 84 mg today.  She tolerated it well without any unusual headache, nausea or vomiting or other somatic symptoms.  Dissociation did occur and she gradually saw resolution over the 2-hour period of observation.  She does not typically find the dissociation very strong. No SE complaints with meds. She is still depressed and still has OCD of course but is improved with the Spravato.  She is tolerating the medications well.  We had previously discussed the possibility of switching some of the Wellbutrin to [facility] and she is very interested in that in hopes of further improvement in depression and OCD.  She understands that [place] is not used for OCD on the label.   She is tolerating the medications.  She is still easily overwhelmed.  She is sleeping and eating okay.. Plan: Reduce Wellbutrin XL to 300 mg AM and add [place] 1 tablet each AM  03/30/22 appt noted: Patient received Spravato 84 mg today.  She tolerated it well without any unusual headache, nausea or vomiting or other somatic symptoms.  Dissociation did occur and she gradually saw resolution over the 2-hour period of observation.  She does not typically find the dissociation very strong. No SE complaints with meds. She is still depressed and still has OCD of course but is improved with the Spravato.  She is tolerating the medications well.  No difference with Auvelity 1 AM so far and no SE.  Going on vacation on Saturday. 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 Sleep and appetite good. Plan: Increase Auvelity to 1 twice daily and  reduce Wellbutrin to XL 150 every morning  04/14/2022 appointment with the following noted: Patient received Spravato 84 mg today.  She tolerated it well without any unusual headache, nausea or vomiting or other somatic symptoms.  Dissociation did occur and she gradually saw resolution over the 2-hour period of observation.  She does not typically find the dissociation very strong. No SE complaints with meds. She is still depressed and still has OCD of course but is improved with the Spravato.  She is tolerating the medications well.  Just increased Auvelity to BID yesterday and reduced Wellbutrin to 150 AM. No SE so far.  No change in mood or anxiety so far.  Chronic OCD as noted and residual depression and chronic fatigue.  04/21/2022 appointment with the following noted: Patient received Spravato 84 mg today.  She tolerated it well without any unusual headache, nausea or vomiting or other somatic symptoms.  Dissociation did occur and she gradually saw resolution over the 2-hour period of observation.  She does not typically find the dissociation very strong. No  SE complaints with meds. She is still depressed and still has OCD of course but is improved with the Spravato.   She has questions about the dosing of lorazepam. She tends to have negative anxious thoughts at night.  This tends to interfere with her ability to go to sleep.  She is getting about 8 to 9 hours of sleep.  She is tolerating the meds without excessive sedation and does not nap during the day.  05/06/22 appt noted: Patient received Spravato 84 mg today.  She tolerated it well without any unusual headache, nausea or vomiting or other somatic symptoms.  Dissociation did occur and she gradually saw resolution over the 2-hour period of observation.  She does not typically find the dissociation very strong. No SE complaints with meds. She is still depressed and still has OCD of course but is improved with the Spravato.   Had some questions about timing of dosing of fluvoxamine and Auvelity. OCD is not quite as time consuming.  Sleep and eating are the same.   No SE meds.  05/25/22 appt noted: Patient received Spravato 84 mg today.  She tolerated it well without any unusual headache, nausea or vomiting or other somatic symptoms.  Dissociation did occur and she gradually saw resolution over the 2-hour period of observation.  She does not typically find the dissociation very strong. No SE complaints with meds. She is still depressed and still has OCD of course but is improved with the Spravato.   She has less OCD when away from home and on vacation of note. Plan: Rec gradually reduce HS lorazepam to 1 mg Hs.  Can continue lorazepam 2 mg AM and 1 mg in afternoon bc of chronic anxiety and it is helpful and tolerated. She can continue temazepam 30 mg nightly.  She tends to have a lot of anxious negative thoughts at night when she is trying to go to bed  06/16/22 appt noted: Patient received Spravato 84 mg today.  She tolerated it well without any unusual headache, nausea or vomiting or other somatic  symptoms.  Dissociation did occur and she gradually saw resolution over the 2-hour period of observation.  She does not typically find the dissociation very strong. No SE complaints with meds. She is still depressed and still has OCD of course but is improved with the Spravato.   She has less OCD when away from home and on vacation of note. She  is tolerating the medications.  She has continued current medications. 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  She still receives benefit from [place].  Her depression and OCD are better with the Spravato.  She thinks she is getting additional benefit with the switch from Wellbutrin to [place].  07/04/2022 appointment noted: Reports she developed a rash on her face from [facility] and feels like she is allergic to it.  She stopped it and went back to Wellbutrin 450 mg every morning.  The rash has cleared up.  She did not require any medical attention and did not have shortness of breath. Overall her depression and OCD are about the same as they have been.  She did not notice a substantial difference from the brief treatment with Auvelity but she understands she did not take a full course.  She is tolerating the current medicines well. Current meds fluvoxamine 400 mg daily, Wellbutrin XL 450 mg daily, lorazepam 1 to 2 mg in the morning and 1 to 2 mg at night and 1 mg in the afternoon, temazepam 30 mg nightly. She wants to continue the Spravato because she feels it has been helpful for both her depression and her racing OCD  07/18/22 appt noted: Patient received Spravato 84 mg today.  She tolerated it well without any unusual headache, nausea or vomiting or other somatic  symptoms.  Dissociation did occur and she gradually saw resolution over the 2-hour period of observation.  She does not typically find the dissociation very strong. No SE complaints with meds. She is still depressed and still has OCD of course but is improved with the Spravato.  Rash better off Auvelity and back on Welllbutrin XL 450 mg AM, fluvoxamine 400 mg daily.  08/15/22 appt noted: Current psych meds: Wellbutrin XL 450 mg AM, fluvoxamine 100 mg in AM and 300 mg HS, lorazepam 1 mg 1-2 mg in the AM and HS and 1 tablet prn midday for anxiety, temazepam 30 mg HS Patient received Spravato 84 mg today.  She tolerated it well without any unusual headache, nausea or vomiting or other somatic symptoms.  Dissociation did occur and she gradually saw resolution over the 2-hour period of observation.  She does not typically find the dissociation very strong. No SE complaints with meds. She has a great deal of stress dealing with her family.  Disc brother's ongoing mania and difficulty getting him help and the stress he causes for the family. She wants to continue Spravato through this very stressful holdicay season and reevaluate the frequency after the New Year.  09/12/22 appt noted: Current psych meds: Wellbutrin XL 450 mg AM, fluvoxamine 100 mg in AM and 300 mg HS, lorazepam 1 mg 1-2 mg in the AM and HS and 1 tablet prn midday for anxiety, temazepam 30 mg HS Patient received Spravato 84 mg today.  She tolerated it well without any unusual headache, nausea or vomiting or other somatic symptoms.  Dissociation did occur and she gradually saw resolution over the 2-hour period of observation.  She does not typically find the dissociation very strong. No SE complaints with meds. She has a great deal of stress dealing with her family.  Disc brother's ongoing mania and difficulty getting him help and the stress he causes for the family. She wants to continue Spravato through this very stressful holdicay season  and reevaluate the frequency after the New Year.  Chronically easily overwhelmed with family. Complaining of HA and history migraine.  Asks for increase imitrex and disc preventatives like propranolol ER  09/26/22 appt noted: Current psych meds: Wellbutrin XL 450 mg AM, fluvoxamine 100 mg in AM and 300 mg HS, lorazepam 1 mg 1-2 mg in the AM and HS and 1 tablet prn midday for anxiety, temazepam 30 mg HS Patient received Spravato 84 mg today.  She tolerated it well without any unusual headache, nausea or vomiting or other somatic symptoms.  Dissociation did occur and she gradually saw resolution over the 2-hour period of observation.  She does not typically find the dissociation very strong. No SE complaints with meds. She has a great deal of stress dealing with her family.  This is ongoing The holidays are much more stressful DT family problems.  She is noting OCD is much worse over the last couple of week.  Depression is better with Spravato. Needed higher dose meds for migraine.   10/03/22 appt noted: Current psych meds: Wellbutrin XL 450 mg AM, fluvoxamine 100 mg in AM and 300 mg HS, lorazepam 1 mg 1-2 mg in the AM and HS and 1 tablet prn midday for anxiety, temazepam 30 mg HS Patient received Spravato 84 mg today.  She tolerated it well without any unusual headache, nausea or vomiting or other somatic symptoms.  Dissociation did occur and she gradually saw resolution over the 2-hour period of observation.  She does not typically find the dissociation very strong. No SE complaints with meds. She has now realized that the rash she previously previously attributed to [facility] was not related.  She is interested may be retrying that after the holidays.  She is tolerating medications otherwise. The holidays remain chronically stressful to her due to family dynamic problems which cause her to consistently feel stuck.  Under more stress her OCD is worse.  She will have a tendency to have crying spells.   The depression and OCD are still improved with Spravato as compared to before.  11/15/22 appt noted: Current psych meds: Wellbutrin XL 450 mg AM, fluvoxamine 100 mg in AM and 300 mg HS, lorazepam 1 mg 1-2 mg in the AM and HS and 1 tablet prn midday for anxiety, temazepam 30 mg HS Patient received Spravato 84 mg today.  She tolerated it well without any unusual headache, nausea or vomiting or other somatic symptoms.  Dissociation did occur and she gradually saw resolution over the 2-hour period of observation.  She does not typically find the dissociation very strong. No SE complaints with meds. Continues to feel depressed and overwhelmed by family problems including her brother's mania and recent eviction and commitment.  Chronic OCD worse when stressed.  No SI.  Tolerating meds. Plan: Per her request continue Wellbutrin XL 450 mg every morning. She has come to the realization that the rash she had previously attributed to [facility] was not related.  She is interested in perhaps retrying Auvelity. There are few alternative medication options that remain.  01/11/23 appt noted: Current psych meds: Wellbutrin XL 150 mg BID and started Auvelity 1 AM, fluvoxamine 100 mg in AM and 300 mg HS, lorazepam 1 mg 1-2 mg in the AM and HS and 1 tablet prn midday for anxiety, temazepam 30 mg HS  Patient received Spravato 84 mg today.  She tolerated it well without any unusual headache, nausea or vomiting or other somatic symptoms.  Dissociation did occur and she gradually saw resolution over the 2-hour period of observation.  She does not typically find the dissociation very strong. No SE complaints with meds. Trouble getting to sessions lately DT transportation problems.   Continues to feel depressed and overwhelmed by OCD and family.  Feels she needs toevery other week Spravato bc it helps for a couple of weeks and then seems to wear off.  Struggleing with OCD and depression both of which are eased by Spravato. Less  crying with Auvelity.  02/07/23 appt noted: Current psych meds: Wellbutrin XL 150 mg BID and started Auvelity 1 AM, fluvoxamine 100 mg in AM and 300 mg HS, lorazepam 1 mg 1-2 mg in the AM and HS and 1 tablet prn midday for anxiety, temazepam 30 mg HS Patient received Spravato 84 mg today.  She tolerated it well without any unusual headache, nausea or vomiting or other somatic symptoms.  Dissociation did occur and she gradually saw resolution over the 2-hour period of observation.  She does not typically find the dissociation very strong. No SE complaints with meds. Trouble getting to sessions lately DT transportation problems.  This is a problem ongoing and thinks she might need to pause Spravato bc won't be able to get her for at least 3 weeks. She is holding pretty steady with a moderate level of anxiety and depression ongoing and chronic.    03/20/23 appt noted: Current psych meds: Wellbutrin XL 150 mg BID and started Auvelity 1 AM, fluvoxamine 100 mg in AM and 300 mg HS, lorazepam 1 mg 1-2 mg in the AM and HS and 1 tablet prn midday for anxiety, temazepam 30 mg HS Patient received Spravato 84 mg today.  She tolerated it well without any unusual headache, nausea or vomiting or other somatic symptoms.  Dissociation did occur and she gradually saw resolution over the 2-hour period of observation.  She does not typically find the dissociation very strong. No SE complaints with meds. She is trying to get back into more regular Spravato administration.  Family issues and transportation problems that led to her missing Spravato.  She feels more depressed without regular Spravato.  Her OCD is chronic but also worse when she misses Spravato.  She does not want any medication changes.  04/03/23 appt noted: Current psych meds: Wellbutrin XL 150 mg BID and started Auvelity 1 AM, fluvoxamine 100 mg in AM and 300 mg HS, lorazepam 1 mg 1-2 mg in the AM and HS and 1 tablet prn midday for anxiety, temazepam 30 mg  HS Patient received Spravato 84 mg today.  She tolerated it well without any unusual headache, nausea or vomiting or other somatic symptoms.  Dissociation did occur and she gradually saw resolution over the 2-hour period of observation.  She does not typically find the dissociation very strong.  It gets rid of negative emotion for awhile after procedure and would like it to last longer.   No SE complaints with meds.  Doesn't really want med changes.   Planning to weekly Spravato.  It is helping dep and anxiety.    04/10/23 appt noted: Current psych meds: Wellbutrin XL 150 mg BID and started Auvelity 1 AM, fluvoxamine 100 mg in AM and 300 mg HS, lorazepam 1 mg 1-2 mg in the AM and HS and 1 tablet prn midday for anxiety, temazepam 30 mg  HS Patient received Spravato 84 mg today.  She tolerated it well without any unusual headache, nausea or vomiting or other somatic symptoms.  Dissociation did occur and she gradually saw resolution over the 2-hour period of observation.  She does not typically find the dissociation very strong.  It gets rid of negative emotion for awhile after procedure and would like it to last longer.   No SE complaints with meds.  Doesn't really want med changes.   Had lidocaine shot for back pain with brief benefit.  Doing some water based PT Spravato went well today. Got pretty upset last week with event related to son's activities.  Got upset with son saying something inappropriate publicly.  Was so embarrassed.  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    He's kind of rebellious lately.  Son is 49 yo.    05/03/23 appt noted: Current psych meds: Wellbutrin XL 150 mg BID and started Auvelity 1 AM, fluvoxamine 100 mg in AM and 300 mg HS, lorazepam 1 mg 1-2 mg in the AM and HS and 1 tablet prn midday for anxiety, temazepam 30 mg HS No SE Patient received Spravato 84 mg today.  She tolerated it well without any unusual headache, nausea or  vomiting or other somatic symptoms.  Dissociation did occur and she gradually saw resolution over the 2-hour period of observation.  She does not typically find the dissociation very strong.  It gets rid of negative emotion for awhile after procedure and would like it to last longer.   Still pleased with benefit from [facility] for mood and anxiety. No SE complaints with meds.  Doesn't really want med changes.   Chronic family px ongoing affects her but nothing she can change.H sick of the family drama.   Continuing counseling helps.  Has some support. Mood and anxiety pretty steady but did go on vacation to [place].  Anxiety worse first in AM and then later at night with mind racing on stressors.   Still back trouble.  05/23/23 appt noted: Current psych meds: Wellbutrin XL 150 mg BID and started Auvelity 1 AM, fluvoxamine 100 mg in AM and 300 mg HS, lorazepam 1 mg 1-2 mg in the AM and HS and 1 tablet prn midday for anxiety, temazepam 30 mg HS No SE Patient received Spravato 84 mg today.  She tolerated it well without any unusual headache, nausea or vomiting or other somatic symptoms.  Dissociation did occur and she gradually saw resolution over the 2-hour period of observation.  She does not typically find the dissociation very strong.  It gets rid of negative emotion for awhile after procedure and would like it to last longer.   Still pleased with benefit from [facility] for mood and anxiety by 50%. No SE complaints with meds. Chronic family stress interferes with mental health and self care.  May have to reduce frequency of Spravato bc of this. But is status quo with meds.  Chronic residual OCD which is mod severe and dep moderate.  05/30/23 appt noted: Current psych meds: Wellbutrin XL 150 mg BID and started Auvelity 1 AM, fluvoxamine 100 mg in AM and 300 mg HS, lorazepam 1 mg 1-2 mg in the AM and HS and 1 tablet prn midday for anxiety, temazepam 30 mg HS No SE Patient received Spravato 84 mg  today.  She tolerated it well without any unusual headache, nausea or vomiting or other somatic symptoms.  Dissociation did occur and she gradually saw resolution over the 2-hour period of observation.  She does not typically find the dissociation very strong.  It gets rid of negative emotion for awhile after procedure and would like it to last longer.   Still pleased with benefit from [facility] for mood and anxiety by 50% or better but not resolved. She wants to continue Spravato as frequently as schedule will allow. Both depression and OCD are better with most improvement in mood.  Asks about any new treatments for OCD. No SE complaints with meds. Chronic family stress interferes with mental health and self care.  This is an ongoing drain on her mood and resources emotionally.    06/06/23 appt noted: Current psych meds: Wellbutrin XL 150 mg BID and started Auvelity 1 AM, fluvoxamine 100 mg in AM and 300 mg HS, lorazepam 1 mg 1-2 mg in the AM and HS and 1 tablet prn midday for anxiety, temazepam 30 mg HS No SE Patient received Spravato 84 mg today.  She tolerated it well without any unusual headache, nausea or vomiting or other somatic symptoms.  Dissociation did occur and she gradually saw resolution over the 2-hour period of observation.  She does not typically find the dissociation very strong.  It gets rid of negative emotion for awhile after procedure and would like it to last longer.   Still believes each meds work and are helpful and well tolerated.  Specifically she thinks that the Auvelity adds additional benefit on taking Wellbutrin.  She believes the meds help both depression and OCD though the OCD remains a problem.  She also believes Spravato helps both types of symptoms as well.  Her mood is variable.  She experiences a great deal of stress from her family and those problems wax and wane.  She has bad days because of it and today is 1 of those days.  She is continuing counseling and that is  helpful.  Due to family demands she may have to spread out the Spravato frequency.  06/16/23 appt noted:  Current psych meds: Wellbutrin XL 150 mg BID and started Auvelity 1 AM, fluvoxamine 100 mg in AM and 300 mg HS, lorazepam 1 mg 1-2 mg in the AM and HS and 1 tablet prn midday for anxiety, temazepam 30 mg HS No SE Patient received Spravato 84 mg today.  She tolerated it well without any unusual headache, nausea or vomiting or other somatic symptoms.  Dissociation did occur and she gradually saw resolution over the 2-hour period of observation.  She does not typically find the dissociation very strong.  It gets rid of negative emotion for awhile after procedure . She is trying to make the Schedule work for [employer] oh every week or every other week because she is aware the treatment effects can be lost if it is time less frequently.  So she has been able to come the last 2 weeks, we will try to come in 2 weeks.  Spravato reduces depression and OCD significantly though she remains highly symptomatic with both.  However she has no suicidal thoughts.  Crying spells are reduced with Spravato.  She still spends a fair amount of time meaning over an hour a day checking and more under stress or when tired.  She asks about any new meds for OCD. Plan: increase Auvelity 1 in AM & PM Hold Wellbutrin XL 150 mg BID  07/12/23 appt noted: Current psych meds: Wellbutrin XL 150 mg BID and started [employer]  1 AM, fluvoxamine 100 mg in AM and 300 mg HS, lorazepam 1 mg 1-2 mg in the AM and HS and 1 tablet prn midday for anxiety, temazepam 30 mg HS.  Didn't increase Auvelity yet No SE Patient received Spravato 84 mg today.  She tolerated it well without any unusual headache, nausea or vomiting or other somatic symptoms.  Dissociation did occur and she gradually saw resolution over the 2-hour period of observation.  She does not typically find the dissociation very strong.  It gets rid of negative emotion for awhile after  procedure . She has not increased the Auvelity yet is planned.  She has a little bit of nervousness about it but admits is not rational.  She is willing to give that a try to try to get better control of depression and anxiety.  She continues to see the benefit of Spravato every other week.  She is struggling with some pain due to plantar fasciitis and given her cerebral palsy that makes it even more difficult to walk and to engage in normal activities.  She is willing to seek treatment for that.  07/25/23 appt noted: Current psych meds: Wellbutrin XL 150 mg AM and started Auvelity 1 BID, fluvoxamine 100 mg in AM and 300 mg HS, lorazepam 1 mg 1-2 mg in the AM and HS and 1 tablet prn midday for anxiety, temazepam 30 mg HS.  Didn't increase Auvelity yet No SE Patient received Spravato 84 mg today.  She tolerated it well without any unusual headache, nausea or vomiting or other somatic symptoms.  Dissociation did occur and she gradually saw resolution over the 2-hour period of observation.  She does not typically find the dissociation very strong.  It gets rid of negative emotion for awhile after procedure . Chronic dep and anxiety to some extent.  But has increased Auvelity to BID recently and no SE yet.  Disc frequency Spravato bc benefit and will try to continue every other week and not less if possible. No further concerns for meds.  08/08/23 appt noted: Current psych meds: Wellbutrin XL 150 mg AM and Auvelity 1 BID, fluvoxamine 100 mg in AM and 300 mg HS, lorazepam 1 mg 1-2 mg in the AM and HS and 1 tablet prn midday for anxiety, temazepam 30 mg HS.  Didn't increase Auvelity yet No SE with med changes. Patient received Spravato 84 mg today.  She tolerated it well without any unusual headache, nausea or vomiting or other somatic symptoms.  Dissociation did occur and she gradually saw resolution over the 2-hour period of observation.  She does not typically find the dissociation very strong.  It gets rid  of negative emotion for awhile after procedure . Mornings still worse with dep and anxiety.  But mood and anxiety are better with the increase in Auvelity to BID.  OCD seems a little better.  Chronic difficulty handling the complaints and stress from her family.  Easily stresser her out.  No med changes desire. She will have to take a break from [facility] pending foot surgery.  Mobility and transportation will be limited.   Previous psych med trials include Prozac, paroxetine, sertraline, fluvoxamine, venlafaxine, Anafranil with no response,  Wellbutrin, , Viibryd, Trintellix 10 1 month NR Auvelity BID  Geodon,  risperidone, Rexulti, Abilify,  Seroquel, Latuda 40 mg with irritability.  lamotrigine lithium,  BuSpar, Namenda,  pramipexole with no response, and Topamax, pindolol  ECT-MADRS    Flowsheet Row Office Visit from 06/29/2021 in [facility]  MADRS Total Score 36      Flowsheet Row Admission (Discharged) from 06/11/2021 in [place] [facility]  C-SSRS RISK CATEGORY No Risk        Review of Systems:  Review of Systems  Constitutional:  Positive for fatigue.  Cardiovascular:  Negative for palpitations.  Musculoskeletal:  Positive for arthralgias, back pain, gait problem, myalgias and neck pain.  Neurological:  Positive for weakness and headaches. Negative for tremors.  Psychiatric/Behavioral:  Positive for dysphoric mood and sleep disturbance. Negative for suicidal ideas. The patient is nervous/anxious.     Medications: I have reviewed the patient's current medications.  Current Outpatient Medications  Medication Sig Dispense Refill   Abaloparatide (TYMLOS) 3120 MCG/1.56ML SOPN Inject into the skin.     Azelastine-Fluticasone 137-50 MCG/ACT SUSP Place 1-2 sprays into both nostrils daily.     baclofen (LIORESAL) 10 MG tablet Take 20 mg by mouth at bedtime as needed for muscle spasms.     buPROPion (WELLBUTRIN XL) 150 MG 24 hr tablet  Take 150 mg by mouth daily.     Dextromethorphan-buPROPion ER (AUVELITY) 45-105 MG TBCR Take 1 tablet by mouth in the morning and at bedtime. 60 tablet 1   dicyclomine (BENTYL) 10 MG capsule Take 10 mg by mouth daily.     docusate sodium (COLACE) 100 MG capsule Take 1 capsule (100 mg total) by mouth 2 (two) times daily. (Patient taking differently: Take 100 mg by mouth daily.) 10 capsule 0   Esketamine HCl, 84 MG Dose, (SPRAVATO, 84 MG DOSE,) 28 MG/DEVICE SOPK USE 3 SPRAYS IN EACH NOSTRIL ONCE A WEEK 3 each 0   fexofenadine (ALLEGRA) 180 MG tablet Take 180 mg by mouth daily.     fluvoxaMINE (LUVOX) 100 MG tablet TAKE 1 TABLET IN THE AM AND 3 TABLETS AT NIGHT 360 tablet 0   hydrocortisone (ANUSOL-HC) 2.5 % rectal cream Place rectally 2 (two) times daily. x 7-14 days 30 g 0   ketotifen (ZADITOR) 0.025 % ophthalmic solution Place 3 drops into both eyes at bedtime.     LORazepam (ATIVAN) 1 MG tablet TAKE 1-2 IN THE AM AND 1-2 TABLETS EVERY NIGHT AT BEDTIME AND 1 TABLET IN AFTERNOON when needed for anxiety and sleep 150 tablet 2   magnesium gluconate (MAGONATE) 500 MG tablet Take 500 mg by mouth daily.     MIBELAS 24 FE 1-20 MG-MCG(24) CHEW Chew 1 tablet by mouth at bedtime as needed (bowel regularity).     Multiple Vitamins-Minerals (ADULT GUMMY PO) Take 2 tablets by mouth in the morning.     nitrofurantoin (MACRODANTIN) 100 MG capsule Take 100 mg by mouth as needed (For urinary tract infection.).      oxyCODONE-acetaminophen (PERCOCET/ROXICET) 5-325 MG tablet Take 1-2 tablets by mouth every 6 (six) hours as needed for severe pain. 50 tablet 0   polyethylene glycol (MIRALAX / GLYCOLAX) packet Take 17 g by mouth daily as needed for mild constipation. 14 each 0   propranolol ER (INDERAL LA) 60 MG 24 hr capsule TAKE 1 CAPSULE BY MOUTH EVERY DAY 30 capsule 5   psyllium (METAMUCIL) 58.6 % powder Take 1 packet by mouth daily as needed (constipation).     SUMAtriptan (IMITREX) 100 MG tablet Take 1 tablet (100  mg total) by mouth every 2 (two) hours as needed for migraine. May repeat in 2 hours if headache persists or recurs. 9 tablet 2   temazepam (RESTORIL) 30 MG capsule Take 1 capsule by mouth  at bedtime as needed for sleep 30 capsule 1   Vitamin D-Vitamin K (VITAMIN K2-VITAMIN D3 PO) Take 1-2 sprays by mouth daily.     No current facility-administered medications for this visit.    Medication Side Effects: None   Allergies:  Allergies  Allergen Reactions   Hydrocodone Itching   Sulfamethoxazole-Trimethoprim Itching   Dust Mite Extract Other (See Comments)    Sneezing, watery eyes, runny nose   Latex Itching   Other Other (See Comments)    PT IS ALLERGIC TO CAT DANDER AND RAGWEED - Sneezing, watery eyes, runny nose    Pollen Extract Other (See Comments)    Sneezing, watery eyes, runny nose     Past Medical History:  Diagnosis Date   Abnormal Pap smear 2011   hpv/mild dysplasia,cin1   Anxiety    Cerebral palsy (HCC)    right arm/leg   Cystocele    Depression    Headache    Neuromuscular disorder (HCC)    Cerebral Palsy   OCD (obsessive compulsive disorder)    Osteoporosis    Uterine prolaps     Family History  Problem Relation Age of Onset   Cancer Father        skin AND LUNG   Alcohol abuse Sister        CRACK COCAINE    Social History   Socioeconomic History   Marital status: Married    Spouse name: Not on file   Number of children: Not on file   Years of education: Not on file   Highest education level: Not on file  Occupational History   Not on file  Tobacco Use   Smoking status: Never   Smokeless tobacco: Never  Substance and Sexual Activity   Alcohol use: Not Currently    Comment: OCCASIONAL beer   Drug use: No   Sexual activity: Yes    Birth control/protection: Pill    Comment: LOESTRIN 24 FE  Other Topics Concern   Not on file  Social History Narrative   Not on file   Social Determinants of Health   Financial Resource Strain: Not on file   Food Insecurity: Not on file  Transportation Needs: Not on file  Physical Activity: Not on file  Stress: Not on file  Social Connections: Not on file  Intimate Partner Violence: Not on file    Past Medical History, Surgical history, Social history, and Family history were reviewed and updated as appropriate.   Please see review of systems for further details on the patient's review from today.   Objective:   Physical Exam:  LMP  (LMP Unknown)   Physical Exam Constitutional:      General: She is not in acute distress. Neurological:     Mental Status: She is alert and oriented to person, place, and time.     Cranial Nerves: No dysarthria.     Motor: Weakness present.     Gait: Gait abnormal.  Psychiatric:        Attention and Perception: Attention and perception normal.        Mood and Affect: Mood is anxious and depressed.        Speech: Speech normal. Speech is not slurred.        Behavior: Behavior normal. Behavior is cooperative.        Thought Content: Thought content normal. Thought content is not delusional. Thought content does not include homicidal or suicidal ideation. Thought content does not include suicidal plan.  Cognition and Memory: Cognition and memory normal. Cognition is not impaired.        Judgment: Judgment normal.     Comments: Insight intact Ongoing OCD remains fairly severe but less anxious Checking compulsions up to 2 hours daily but improved noticeably with Spravato Chronic depression persistent but better with Spravato  and helps OCD too and further 15% better with the increase in Auvelity        Lab Review:     Component Value Date/Time   NA 138 06/11/2021 0606   K 4.0 06/11/2021 0606   CL 107 06/11/2021 0606   CO2 26 06/11/2021 0606   GLUCOSE 90 06/11/2021 0606   BUN 18 06/11/2021 0606   CREATININE 0.81 06/11/2021 0606   CALCIUM 9.4 06/11/2021 0606   PROT 6.5 06/11/2021 0606   ALBUMIN 3.3 (L) 06/11/2021 0606   AST 17  06/11/2021 0606   ALT 14 06/11/2021 0606   ALKPHOS 141 (H) 06/11/2021 0606   BILITOT 0.2 (L) 06/11/2021 0606   GFRNONAA >60 06/11/2021 0606   GFRAA >60 07/09/2016 0438       Component Value Date/Time   WBC 5.8 06/11/2021 0606   RBC 4.12 06/11/2021 0606   HGB 12.5 06/11/2021 0606   HCT 39.7 06/11/2021 0606   PLT 299 06/11/2021 0606   MCV 96.4 06/11/2021 0606   MCH 30.3 06/11/2021 0606   MCHC 31.5 06/11/2021 0606   RDW 13.9 06/11/2021 0606   LYMPHSABS 1.9 06/11/2021 0606   MONOABS 0.5 06/11/2021 0606   EOSABS 0.1 06/11/2021 0606   BASOSABS 0.0 06/11/2021 0606    No results found for: "POCLITH", "LITHIUM"   No results found for: "PHENYTOIN", "PHENOBARB", "VALPROATE", "CBMZ"   .res Assessment: Plan:    Casey "[name]" was seen today for follow-up, depression, anxiety, stress and sleeping problem.  Diagnoses and all orders for this visit:  Recurrent major depression resistant to treatment [facility])  Mixed obsessional thoughts and acts  Social anxiety disorder  Insomnia due to mental condition  Migraine without aura and without status migrainosus, not intractable  Caregiver stress    Both primary Dx of OCD and major depression are TR and marked.  Impaired function but less so with Spravato re: depression..   She is receiving Spravato 84 mg weekly and moderate improvement in the depression..  she feels it also helps OCD somewhat.  However still easily overwhelmed with low stress tolerance.  Family contributes to her anxiety and stress markedly.  The OCD is improved with the increase in fluvoxamine and with Spravato.  Spends up to 2 hours daily and checking compulsions on her worst days but better when she travels.  No new options for tx are evident.  She has been on higher doses of fluvoxamine above the usual max of 400 mg daily in the past and finds it more helpful at the higher dose.    Disc SE.   She is tolerating the meds well  Continue  Luvox back to 400 mg nightly as  of January 2023. Disc dosing higher than usual.  She feels this is increase has helped more with OCD which remains chronically severe.  Continue Auvelity BID and reduced Wellbutrin to 1 AM. It has helped and is tolerated.   We have discussed seizure risk that is possible using this combination but given severity of her symptoms she feels the risk is warranted. There are few other alternative medication options that remain.   Disc DDI issues.   Consider olanzapine for TR  anxiety and TRD but sig risk weight gain. She doesn't want to try this now.  Disc Spravato DT TRD incl details and SE. Disc dosing and duration.  Pt with severe depression MADRS 36 on 06/29/21  Patient was administered Spravato 84 mg intranasally dosage today.  The patient experienced the typical dissociation which gradually resolved over the 2-hour period of observation.  There were no complications.  Specifically the patient did not have nausea or vomiting or headache.  Blood pressures remained within normal ranges at the 40-minute and 2-hour follow-up intervals.  By the time the 2-hour observation period was met the patient was alert and oriented and able to exit without assistance. She tends to have lingering sedative effects but not severe. .  See nursing note for further details. Try to continue every other week Spravato but this will be on hold DT pending foot surgery  Didn't feel as well when off spravato but disc family issues interfering with frequency.  We discussed the short-term risks associated with benzodiazepines including sedation and increased fall risk among others.  Discussed long-term side effect risk including dependence, potential withdrawal symptoms, and the potential eventual dose-related risk of dementia.  But recent studies from 2020 dispute this association between benzodiazepines and dementia risk. Newer studies in 2020 do not support an association with dementia. Disc this is high dose and not ideal.   Also disc risk combining it with temazepam. Rec try gradually reduce HS lorazepam to 1 mg Hs.  Can continue lorazepam 2 mg AM and 1 mg in afternoon bc of chronic anxiety and it is helpful and tolerated. She can continue temazepam 30 mg nightly.  She tends to have a lot of anxious negative thoughts at night when she is trying to go to bed.  She is trying to reduce the dose.  Complaining of HA and history migraine.  Asks for increase imitrex and disc preventatives like propranolol ER imitrex to 100 mg prn migraine and propranolol ER for migraine prevention.  Supportive therapy dealing with some of the recent stressors including son's autism and recent issues of rebelliousness with him.    continue Auvelity 1 in AM & PM Reduced  Wellbutrin XL 150 mg AM Fluvoxamine 100 mg AM and 300 mg PM above usual max bc med necessary Lorazepam 2 mg HS and prn 1 mg BID prn anxiety daily Temazepam 30 mg HS Propranolol ER 60 mg daily Imitrex prn migraine  Follow-up every week if possible and if note every other week and try to be consistent. That is her plan but family demands do interfere at times.     Meredith Staggers, MD, DFAPA  Please see After Visit Summary for patient specific instructions.  Future Appointments  Date Time Provider Department Center  08/16/2023  6:00 PM Robley Fries, PhD [facility] None  08/30/2023  6:00 PM Robley Fries, PhD [facility] None  09/20/2023  6:00 PM Mitchum, Molly Maduro, PhD [facility] None    No orders of the defined types were placed in this encounter.    -------------------------------

## 2023-08-08 NOTE — Progress Notes (Signed)
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 Pt is being treated for Treatment Resistant Depression, Pt taken to treatment room. Pt's medication is charged through [employer].  Medication is stored behind 2 locked doors, it is never given to the pt until time of administration which is observed by the nurse. Disposed of per FDA/REMS regulations. All Spravato Treatments are documented in Spravato REMS per protocol of being a treatment center. Spravato is a CIII medication and has to be only given at a treatment facility and observed by nurse as pt administered intranasally   She was directed to the treatment room to get vitals taken first. Initial vital signs are B/P at 3:25 PM 135/88, 84, Pt instructed to blow her nose and to recline back at 45 degrees. Pt given first nasal spray (28 mg) administered by pt observed by nurse. There were 5 minutes between each dose, total of 84 mg. Tolerated well. Assessed pt's 40 minute vital signs at 4:05 PM 130/93, 80.  Pt met with [name] and they discussed her care at the end of her treatment when her thoughts are clearer and her medication and moods. She does go to the bathroom at least once during her treatment. No sedation and had slight feeling of being "high" she reports. Discharge vitals at 5:15 PM 128/90, 81. Pt stable for discharge.  Pt was observed on site a total of 120 minutes per FDA/REMS requirements. Pt was with nurse for clinical assessment 50 minutes. Pt is having foot surgery so she will need to put her treatments on hold for probably 2 months but she will keep [place] posted.    LOT [id number] EXP APR 2027

## 2023-08-16 ENCOUNTER — Ambulatory Visit (INDEPENDENT_AMBULATORY_CARE_PROVIDER_SITE_OTHER): Payer: 59 | Admitting: Psychiatry

## 2023-08-16 DIAGNOSIS — G809 Cerebral palsy, unspecified: Secondary | ICD-10-CM

## 2023-08-16 DIAGNOSIS — F339 Major depressive disorder, recurrent, unspecified: Secondary | ICD-10-CM

## 2023-08-16 DIAGNOSIS — F422 Mixed obsessional thoughts and acts: Secondary | ICD-10-CM | POA: Diagnosis not present

## 2023-08-16 DIAGNOSIS — F4010 Social phobia, unspecified: Secondary | ICD-10-CM

## 2023-08-16 DIAGNOSIS — Z638 Other specified problems related to primary support group: Secondary | ICD-10-CM

## 2023-08-16 DIAGNOSIS — Z636 Dependent relative needing care at home: Secondary | ICD-10-CM
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
## 2023-08-22 ENCOUNTER — Ambulatory Visit (INDEPENDENT_AMBULATORY_CARE_PROVIDER_SITE_OTHER): Payer: 59 | Admitting: Psychiatry

## 2023-08-22 ENCOUNTER — Ambulatory Visit: Payer: 59

## 2023-08-22 VITALS — BP 116/82 | HR 90

## 2023-08-22 DIAGNOSIS — F339 Major depressive disorder, recurrent, unspecified: Secondary | ICD-10-CM

## 2023-08-22 DIAGNOSIS — Z636 Dependent relative needing care at home: Secondary | ICD-10-CM

## 2023-08-22 DIAGNOSIS — F5105 Insomnia due to other mental disorder: Secondary | ICD-10-CM

## 2023-08-22 DIAGNOSIS — F422 Mixed obsessional thoughts and acts: Secondary | ICD-10-CM

## 2023-08-22 DIAGNOSIS — G43009 Migraine without aura, not intractable, without status migrainosus: Secondary | ICD-10-CM

## 2023-08-22 DIAGNOSIS — F4010 Social phobia, unspecified: Secondary | ICD-10-CM

## 2023-08-22 NOTE — Progress Notes (Signed)
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 Pt is being treated for Treatment Resistant Depression, Pt taken to treatment room. Pt's medication is charged through [employer].  Medication is stored behind 2 locked doors, it is never given to the pt until time of administration which is observed by the nurse. Disposed of per FDA/REMS regulations. All Spravato Treatments are documented in Spravato REMS per protocol of being a treatment center. Spravato is a CIII medication and has to be only given at a treatment facility and observed by nurse as pt administered intranasally   She was directed to the treatment room to get vitals taken first. Initial vital signs are B/P at 3:15 PM 127/95, 98, Pt instructed to blow her nose and to recline back at 45 degrees. Pt given first nasal spray (28 mg) administered by pt observed by nurse. There were 5 minutes between each dose, total of 84 mg. Tolerated well. Assessed pt's 40 minute vital signs at 4:00 PM 99/88, 95.  Pt met with [name] and they discussed her care at the end of her treatment when her thoughts are clearer and her medication and moods. She does go to the bathroom at least once during her treatment. No sedation and had slight feeling of being "high" she reports. Discharge vitals at 5:05 PM 116/82, 90. Pt stable for discharge.  Pt was observed on site a total of 120 minutes per FDA/REMS requirements. Pt was with nurse for clinical assessment 50 minutes. Pt is having foot surgery on Thursday, November 7th. Informed her I would be checking on her after surgery as she is worried.     LOT [id number] FEB 2027 EXP APR 2027

## 2023-08-25 ENCOUNTER — Encounter: Payer: Self-pay | Admitting: Psychiatry

## 2023-08-25 NOTE — Progress Notes (Signed)
Casey Diaz [id number] September 21, 1966 57 y.o.    Subjective:   Patient ID:  Casey Diaz is a 57 y.o. (DOB 11/11/1965) female.  Chief Complaint:  Chief Complaint  Patient presents with   Follow-up   Depression   Anxiety   Sleeping Problem   Stress     HPI Casey Diaz presents to the office today for follow-up of OCD and severe anxiety.     December 2019 visit the following was noted: No meds were changed. Lives in [name] and back for followup.  Sx are about the same.  Has to take meds with different sizes. Pt reports that mood is Anxious and Depressed and describes anxiety as Severe. Anxiety symptoms include: Excessive Worry, Obsessive Compulsive Symptoms:   Checking,,. Pt reports has interrupted sleep and nocturia. Pt reports that appetite is good. Pt reports that energy is no change and down slightly. Concentration is down slightly. Suicidal thoughts:  denied by patient. Loves the environment of [name] but misses some things there.  She's not able to work there.  H works there and likes it.  Struggled with not working, feels isolated and not up to task of meeting people.  Does attend a church and met a friend who's been helpful.  Leaving for [name] on 10/16/18.   04/09/2020 appointment the following is noted:  Staying another year in [name] bc Covid and other things. Last few months a lot of crying spells.  Is in menopause. Wonders about med changes though is nervous about it.  Crying spells associated with depressing thoughts more than stress or OCD.   Covid really hard on everyone and couldn't see family for 18 mos.  Family still very dysfunctional. No close friends in part due to OCD and depression. Son high Autism spectrum with ADHD and anxiety and she's with him all the time. Greater health problems with CP so more pains.   05/15/20 appt with the following noted: [name] for menopause and helps some. Still depressed.  Chronically. In [place] for 2 more weeks then  to [name] for another year. A lot of stressors lately triggering more checking and anxiety.   OCD is her CC now and seems.  Got worse DT stress.   Stressed with Asberger's son and her health.  H works a lot.  Her FOO still stress. Plan: Trintellix 10 mg 1 tablet in the morning with food and reduce fluvoxamine to 5 tablets nightly for 1 week  then reduce it to 4 tablets nightly.   07/02/20 appt with the following noted: Decided not to get Trintellix bc difficulty getting it. It is available.  There.  Wants to start it now.   Both depression and OCD are severe.  Not suicidal in intent or plan. Did not take samples with her to [name] but will be back in December. covid is worse there and travel is difficult.  Wants to reduce Wellbutrin DT dry mouth. Plan: She's afraid to reduce Luvox at this time DT fear of worsening OCD.  But will consider. Trintellix 10 mg 1 tablet in the morning with food and reduce fluvoxamine to 5 tablets nightly for 1 week  then reduce it to 4 tablets nightly. Also reduce Wellbutrin XL to 300 mg daily.    9-13 2022 appointment with the following noted: Back in [place] since July 14.  Broke arm a month ago and surgery.  It's all been rough adjustment.   B has cancer on his face and M fell taking him  to the doctor.  Misses the water and weather of [name].   Cry a lot more since menopause. Still depression and anxiety and OCD.  Asks about ketamine. On Wellbutrin 300, Luvox 300.  No Trintellix. Added Ativan 2 mg AM and HS and it helps.  More likely to get upset at night. Plan: Increase Luvox back to 400 mg daily.  She thinks she's worse on less. Continue Wellbutrin XL to 300 mg daily. 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  She tolerated it well without unusual nausea or vomiting headache or other somatic symptoms.  She did have the expected dissociation which gradually  resolved over the course of the 2-hour period of observation.  She was a little concerned about her balance given her cerebral palsy but has not noted unusual or unexpected problems.  She is motivated to can continue Spravato in hopes of reducing her depressive symptoms. 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  She also has treatment resistant OCD which is partially managed with medications but is still quite disabling.  She is tolerating the medications well.  She is sleeping adequately.  Her appetite is adequate.  She is not having suicidal thoughts.  She continues to wish for a better treatment for OCD that would give her some relief.  09/30/2021 appointment with the following noted: She received her first dose of Spravato 84 mg intranasally today.  She tolerated it well without unusual nausea, vomiting, or other somatic symptoms.  Dissociation as expected did occur and gradually resolved over the 2-hour period of observation.  She did have a mild headache today with the treatment and received ibuprofen 600 mg at her request.  We will follow this to see if it is a pattern Patient is still depressed.  She said she was late with her medicine today and today was a particularly depressing day.  However she notes that the Spravato has lifted her mood considerably even today.  She is hopeful that it will continue to be helpful.  No suicidal thoughts.  She has ongoing chronic anxiety and OCD at baseline.  10/04/21 appt noted: Patient received Spravato 84 mg for the second time today.  She tolerated it well without any unusual headache, nausea or vomiting or other somatic symptoms.  Dissociation did occur and she gradually [place] resolution over the 2-hour period of observation. She did not have any unusual problems after she left the office last Spravato administration.  She did not have any specific problems with balance or walking.  She is at increased risk of that  difficulty because of cerebral palsy.  So far she has not noticed much mood effect from the medication beyond the first day of receiving it.  However she would like to continue Spravato in hopes of getting the antidepressant effect that is desired. Stress dealing with mother's behavior at party pt hosted.  Guilt over it.  10/07/2021 appointment noted: Patient received Spravato 84 mg for the second time today.  She tolerated it well without any unusual headache, nausea or vomiting or other somatic symptoms.  Dissociation did occur and she gradually [place] resolution over the 2-hour period of observation. She still is not sure about the antidepressant effect of Spravato.  Events over the holidays and demands, make it difficult to assess.  She still notes that the OCD tends to worsen the depression and vice versa.  She tolerates the Spravato well and wants to continue the trial.  10/15/2021 appointment with the following noted: Patient received Spravato 84 mg for the second time today.  She tolerated it well without any unusual headache, nausea or vomiting or other somatic symptoms.  Dissociation did occur and she gradually [place] resolution over the 2-hour period of observation. Patient says it was somewhat difficult to evaluate the effect of the Spravato.  It was scheduled to be twice weekly for 4 weeks consecutively but the holidays have interfered with that administration.  She asked what specifically should be she should be looking for in order to assess improvement.  That was discussed.  The OCD is unchanged and the depression so far is not significantly different.  She still tolerates meds.  There have been no recent med changes  10/19/2021 appt noted: Patient received Spravato 84 mg for the second today.  She tolerated it well without any unusual headache, nausea or vomiting or other somatic symptoms.  Dissociation did occur and she gradually saw resolution over the 2-hour period of observation.    10/21/2021 appointment noted: Patient received Spravato 84 mg today.  She tolerated it well without any unusual headache, nausea or vomiting or other somatic symptoms.  Dissociation did occur and she gradually saw resolution over the 2-hour period of observation.  She feels better than last week.  She is not as depressed and down.  She is still dealing with grief around the death of her cousin that was unexpected.  It is still difficult to tell how much the Spravato was doing but she is hopeful.  Anxiety is still present with the OCD.  She is not having suicidal thoughts.  She is not hopeless.  She wants to continue treatment.  10/25/2021 appointment with the following noted: Patient received Spravato 84 mg today.  She tolerated it well without any unusual headache, nausea or vomiting or other somatic symptoms.  Dissociation did occur and she gradually saw resolution over the 2-hour period of observation.  She does not typically find the dissociation very strong. She is beginning to think the Spravato is helping somewhat with the depression.  It has been difficult to tell with the holidays intervening as well as the death of her cousin.  She has not been able to get Spravato twice weekly for 4 weeks straight as typically planned.  However she is hopeful.  The OCD remains significant.  She still has a tendency to think very negatively.  She is not suicidal.  10/28/2021 appointment with the following noted: Patient received Spravato 84 mg today.  She tolerated it well without any unusual headache, nausea or vomiting or other somatic symptoms.  Dissociation did occur and she gradually saw resolution over the 2-hour period of observation.  She does not typically find the dissociation very strong. She is feeling more hopeful about the administration of Spravato.  She is having less depression she believes.  Still not dramatically different.  She still has a tendency to have a lot of anxiety and rumination and  OCD.  She is not suicidal.  She is eager to continue the Spravato.  11/01/2021 appointment with the following noted: Patient received Spravato 84 mg today.  She tolerated it well without any unusual headache, nausea or vomiting or other somatic symptoms.  Dissociation did occur and she gradually saw resolution over the 2-hour period of observation.  She does not typically find the dissociation very strong. She is continuing to see a little bit of  improvement in depression with Spravato.  The anxiety remains but may be not as severe.  The OCD remains markedly severe chronically.  She is not suicidal.  She is encouraged by the degree of improvement with Spravato and inability to enjoy things more and not be quite as ruminative.  11/04/2021 appt noted: Patient received Spravato 84 mg today.  She tolerated it well without any unusual headache, nausea or vomiting or other somatic symptoms.  Dissociation did occur and she gradually saw resolution over the 2-hour period of observation.  She does not typically find the dissociation very strong. No SE complaints with meds. She continues to feel hopeful about the Spravato.  She has less depression.  Because of a number of factors she is uncertain of the full benefit but thinks she is somewhat less depressed.  Her anxiety and OCD remain significant but a little better.  She is tolerating the medications and does not desire medicine change.  She is not currently complaining of insomnia.   11/08/2021 appointment the following noted: Patient received Spravato 84 mg today.  She tolerated it well without any unusual headache, nausea or vomiting or other somatic symptoms.  Dissociation did occur and she gradually saw resolution over the 2-hour period of observation.  She does not typically find the dissociation very strong. No SE complaints with meds. She feels the Spravato is helping somewhat.  She would like to see a greater effect.  However she is able to enjoy things.   She is productive at home.  She would like to see a lifting of a degree of sadness that remains.  The anxiety and OCD remained largely unchanged.  She wondered about the dosing of Wellbutrin 300 mg a day and Luvox 300 mg a day and possible increases.  She has been at higher doses in the past.  She plans to start water therapy for her weakness and for her shoulder.  11/11/2021 appointment with the following noted: Patient received Spravato 84 mg today.  She tolerated it well without any unusual headache, nausea or vomiting or other somatic symptoms.  Dissociation did occur and she gradually saw resolution over the 2-hour period of observation.  She does not typically find the dissociation very strong. No SE complaints with meds. She feels the Spravato is clearly helping the depression.  She would like to see a more significant effect.  She is still having trouble thinking positive. Her energy is fair.  Concentration is good except for the problem with chronic obsessions. She has been taking Wellbutrin 300 mg in Luvox 300 mg for quite some time but has taken higher doses in the past.  We discussed that.  She would like to try higher doses in order to get a better effect if possible. We just increased the doses a couple of days ago.  No effect yet.  11/15/2021 appointment with the following noted: Patient received Spravato 84 mg today.  She tolerated it well without any unusual headache, nausea or vomiting or other somatic symptoms.  Dissociation did occur and she gradually saw resolution over the 2-hour period of observation.  She does not typically find the dissociation very strong. No SE complaints with meds. The patient is now convinced that the Spravato is helping the depression.  She would like to continue twice weekly Spravato this week if possible.  She has tolerated the increase in Wellbutrin to 450 mg daily and the increase and fluvoxamine to 400 mg daily without complications thus far.  The OCD  and anxiety feed the depression to some extent. She spends approximately 2 hours daily with checking compulsions due to obsessions about causing harm to others.  For example fearing that when she has hit a pot hole that she may have hit a person and going back to check.  Checking corners and rooms out of fear that she may have harmed someone.  Other various checking compulsions.  She is hoping the increase in fluvoxamine to 400 mg will reduce that over the weeks to come.  She is not seeing a significant difference with the addition of the Spravato though she understands that was not expected.  She is more productive at home and more motivated and able to enjoy things more fully as a result of the Spravato treatment.  She is tolerating the medication  11/18/2021 appointment with the following noted: Patient received Spravato 84 mg today.  She tolerated it well without any unusual headache, nausea or vomiting or other somatic symptoms.  Dissociation did occur and she gradually saw resolution over the 2-hour period of observation.  She does not typically find the dissociation very strong. No SE complaints with meds. She clearly believes the Spravato has been helpful for the depression.  She wonders whether to continue to treatments weekly or to cut back to 1 weekly.  She would like to continue twice weekly in hopes of getting additional improvement in the depression because it is not resolved but it is difficult to get here twice a week in terms of arranging rides. She is recently increased Wellbutrin XL to 450 mg daily and fluvoxamine to 400 mg daily but they have not had time to have an official effect.  She is tolerating that well.  She is tolerating meds overwork overall well. The OCD remains the same as noted on 11/15/2021  11/25/21 appt noted: Patient received Spravato 84 mg today.  She tolerated it well without any unusual headache, nausea or vomiting or other somatic symptoms.  Dissociation did occur and  she gradually saw resolution over the 2-hour period of observation.  She does not typically find the dissociation very strong. No SE complaints with meds. She thinks the increase in Wellbutrin and Luvox have been potentially helpful for depression and OCD respectively.  It has been too early to see the full effect.  She is sleeping and eating well.  She is functioning at home.  She still spends a lot of time that is about 2 hours a day dealing with compulsive behaviors.  12/02/21 appt noted: Patient received Spravato 84 mg today.  She tolerated it well without any unusual headache, nausea or vomiting or other somatic symptoms.  Dissociation did occur and she gradually saw resolution over the 2-hour period of observation.  She does not typically find the dissociation very strong. No SE complaints with meds. Several losses and stressors recently that affect her sense of mood. However still sees significant benefit from the Spravato for her depression.  Wants to continue it. Suspect early  some benefit from the increased Wellbutrin for depression and Luvox for OCD. Tolerating meds. No complaints about the meds. Sleeping and eating well.  No new health concerns.  12/09/21 appt noted: Patient received Spravato 84 mg today.  She tolerated it well without any unusual headache, nausea or vomiting or other somatic symptoms.  Dissociation did occur and she gradually saw resolution over the 2-hour period of observation.  She does not typically find the dissociation very strong. No SE complaints with meds. Seeing noticeable  improvement from increase fluvoxamine to 400 mg daily.  Tolerating meds without concerns over them. Depression is stable with residual sx of easy guilt and easily stressed.  OCD contributes to depression but depression is not severe with less crying spells.  Productive at home with chores.  Enjoyed recent birthday.  Sleeping good. No new concerns.  12/23/2021 appointment noted: Patient  received Spravato 84 mg today.  She tolerated it well without any unusual headache, nausea or vomiting or other somatic symptoms.  Dissociation did occur and she gradually saw resolution over the 2-hour period of observation.  She does not typically find the dissociation very strong. No SE complaints with meds. Seeing noticeable improvement from increase fluvoxamine to 400 mg daily.  Tolerating meds without concerns over them. Her depression is somewhat improved with the Spravato.  She also feels generally a little lighter.  She is more motivated.  She is less overwhelmed by guilt.  The OCD is gradually improving but is still quite time-consuming as noted before.  She is sleeping well.  No side effects  12/30/2021 appointment with the following noted: Patient received Spravato 84 mg today.  She tolerated it well without any unusual headache, nausea or vomiting or other somatic symptoms.  Dissociation did occur and she gradually saw resolution over the 2-hour period of observation.  She does not typically find the dissociation very strong. No SE complaints with meds. Seeing noticeable improvement from increase fluvoxamine to 400 mg daily.  Tolerating meds without concerns over them. She is confident of her the improvement seen with Spravato.  She is less hopeless.  Guilt is marked remarkably improved.  She is not having any thoughts of death or dying.  She is more motivated for activities such as exercise which she is recently started.  She is sleeping well. The OCD remains severe but it is improving somewhat with the increase in fluvoxamine.  It is still consuming a couple hours per day.  01/10/22 apravato 84 admin  01/24/22 appt noted: Patient received Spravato 84 mg today.  She tolerated it well without any unusual headache, nausea or vomiting or other somatic symptoms.  Dissociation did occur and she gradually saw resolution over the 2-hour period of observation.  She does not typically find the  dissociation very strong. No SE complaints with meds. Very tearful today.  Feels like she has been suppressing emotion in the Spravato caused it to be released.  Discussed some stressors.  Overall still feels the medicine is helpful.  She has missed some of the scheduled Spravato treatments that were intended to be weekly due to circumstances beyond her control.  She is still struggling with OCD as previously noted but does believe the medications are helpful. Plan no med changes  01/31/2022 received Spravato 84 mg today  02/09/2022 appointment with the following noted: Patient received Spravato 84 mg today.  She tolerated it well without any unusual headache, nausea or vomiting or other somatic symptoms.  Dissociation did occur and she gradually saw resolution over the 2-hour period of observation.  She does not typically find the dissociation very strong. No SE complaints with meds. Spravato clearly helps depression and OCD but easily gets overwhelmed and tearful with fairly routine stressors.  Tolerating meds. Sleep and appetite is OK Asks to increase lorazepam to 2 mg AM and HS and 1mg  afternoon  02/16/22 appt noted: Patient received Spravato 84 mg today.  She tolerated it well without any unusual headache, nausea or vomiting or other somatic symptoms.  Dissociation did occur and she gradually saw resolution over the 2-hour period of observation.  She does not typically find the dissociation very strong. No SE complaints with meds. She has chronic depesssion and OCD but is improved with Spravato, both dx versus before.  She has continued Luvox 400 mg and Wellbutrin 450 mg and is tolerating it.  Chronically easily stressed.  Tolerating all meds.  Doesn't like taking more meds.  Spending a couple hours daily with OCD.  No SI No med changes.  02/21/22 appt noted:   Doesn't like taking more meds.  Spending a couple hours daily with OCD.  No SI No med changes.  02/21/22 appt noted: Patient received  Spravato 84 mg today.  She tolerated it well without any unusual headache, nausea or vomiting or other somatic symptoms.  Dissociation did occur and she gradually saw resolution over the 2-hour period of observation.  She does not typically find the dissociation very strong. No SE complaints with meds. She has chronic depesssion and OCD but is improved with Spravato, both dx versus before.  She has continued Luvox 400 mg and Wellbutrin 450 mg and is tolerating it.  Chronically easily overwhelmed and doesn't know why.  Tolerating all meds. Wants to continue meds.  03/16/22 appt noted: Patient received Spravato 84 mg today.  She tolerated it well without any unusual headache, nausea or vomiting or other somatic symptoms.  Dissociation did occur and she gradually saw resolution over the 2-hour period of observation.  She does not typically find the dissociation very strong. No SE complaints with meds. Overall she still feels the Spravato has been helpful not only for her depression but also for her OCD which was somewhat unexpected.  OCD is still significant but it is less severe than prior to starting Spravato.  She is tolerating Luvox 400 mg and Wellbutrin 450 mg.  We discussed possible med adjustments.  03/23/22 appt noted: Patient received Spravato 84 mg today.  She tolerated it well without any unusual headache, nausea or vomiting or other somatic symptoms.  Dissociation did occur and she gradually saw resolution over the 2-hour period of observation.  She does not typically find the dissociation very strong. No SE complaints with meds. She is still depressed and still has OCD of course but is improved with the Spravato.  She is tolerating the medications well.  We had previously discussed the possibility of switching some of the Wellbutrin to [facility] and she is very interested in that in hopes of further improvement in depression and OCD.  She understands that Auvelity is not used for OCD on the label.   She is tolerating the medications.  She is still easily overwhelmed.  She is sleeping and eating okay.. Plan: Reduce Wellbutrin XL to 300 mg AM and add Auvelity 1 tablet each AM  03/30/22 appt noted: Patient received Spravato 84 mg today.  She tolerated it well without any unusual headache, nausea or vomiting or other somatic symptoms.  Dissociation did occur and she gradually saw resolution over the 2-hour period of observation.  She does not typically find the dissociation very strong. No SE complaints with meds. She is still depressed and still has OCD of course but is improved with the Spravato.  She is tolerating the medications well.  No difference with Auvelity 1 AM so far and no SE.  Going on vacation on Saturday. 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 Sleep and appetite good. Plan: Increase Auvelity to 1 twice daily and  reduce Wellbutrin to XL 150 every morning  04/14/2022 appointment with the following noted: Patient received Spravato 84 mg today.  She tolerated it well without any unusual headache, nausea or vomiting or other somatic symptoms.  Dissociation did occur and she gradually saw resolution over the 2-hour period of observation.  She does not typically find the dissociation very strong. No SE complaints with meds. She is still depressed and still has OCD of course but is improved with the Spravato.  She is tolerating the medications well.  Just increased Auvelity to BID yesterday and reduced Wellbutrin to 150 AM. No SE so far.  No change in mood or anxiety so far.  Chronic OCD as noted and residual depression and chronic fatigue.  04/21/2022 appointment with the following noted: Patient received Spravato 84 mg today.  She tolerated it well without any unusual headache, nausea or vomiting or other somatic symptoms.  Dissociation did occur and she gradually saw resolution over the 2-hour period of observation.  She does not typically find the dissociation very strong. No  SE complaints with meds. She is still depressed and still has OCD of course but is improved with the Spravato.   She has questions about the dosing of lorazepam. She tends to have negative anxious thoughts at night.  This tends to interfere with her ability to go to sleep.  She is getting about 8 to 9 hours of sleep.  She is tolerating the meds without excessive sedation and does not nap during the day.  05/06/22 appt noted: Patient received Spravato 84 mg today.  She tolerated it well without any unusual headache, nausea or vomiting or other somatic symptoms.  Dissociation did occur and she gradually saw resolution over the 2-hour period of observation.  She does not typically find the dissociation very strong. No SE complaints with meds. She is still depressed and still has OCD of course but is improved with the Spravato.   Had some questions about timing of dosing of fluvoxamine and Auvelity. OCD is not quite as time consuming.  Sleep and eating are the same.   No SE meds.  05/25/22 appt noted: Patient received Spravato 84 mg today.  She tolerated it well without any unusual headache, nausea or vomiting or other somatic symptoms.  Dissociation did occur and she gradually saw resolution over the 2-hour period of observation.  She does not typically find the dissociation very strong. No SE complaints with meds. She is still depressed and still has OCD of course but is improved with the Spravato.   She has less OCD when away from home and on vacation of note. Plan: Rec gradually reduce HS lorazepam to 1 mg Hs.  Can continue lorazepam 2 mg AM and 1 mg in afternoon bc of chronic anxiety and it is helpful and tolerated. She can continue temazepam 30 mg nightly.  She tends to have a lot of anxious negative thoughts at night when she is trying to go to bed  06/16/22 appt noted: Patient received Spravato 84 mg today.  She tolerated it well without any unusual headache, nausea or vomiting or other somatic  symptoms.  Dissociation did occur and she gradually saw resolution over the 2-hour period of observation.  She does not typically find the dissociation very strong. No SE complaints with meds. She is still depressed and still has OCD of course but is improved with the Spravato.   She has less OCD when away from home and on vacation of note. She  is tolerating the medications.  She has continued current medications. 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  She still receives benefit from [place].  Her depression and OCD are better with the Spravato.  She thinks she is getting additional benefit with the switch from Wellbutrin to [place].  07/04/2022 appointment noted: Reports she developed a rash on her face from [facility] and feels like she is allergic to it.  She stopped it and went back to Wellbutrin 450 mg every morning.  The rash has cleared up.  She did not require any medical attention and did not have shortness of breath. Overall her depression and OCD are about the same as they have been.  She did not notice a substantial difference from the brief treatment with Auvelity but she understands she did not take a full course.  She is tolerating the current medicines well. Current meds fluvoxamine 400 mg daily, Wellbutrin XL 450 mg daily, lorazepam 1 to 2 mg in the morning and 1 to 2 mg at night and 1 mg in the afternoon, temazepam 30 mg nightly. She wants to continue the Spravato because she feels it has been helpful for both her depression and her racing OCD  07/18/22 appt noted: Patient received Spravato 84 mg today.  She tolerated it well without any unusual headache, nausea or vomiting or other somatic  symptoms.  Dissociation did occur and she gradually saw resolution over the 2-hour period of observation.  She does not typically find the dissociation very strong. No SE complaints with meds. She is still depressed and still has OCD of course but is improved with the Spravato.  Rash better off Auvelity and back on Welllbutrin XL 450 mg AM, fluvoxamine 400 mg daily.  08/15/22 appt noted: Current psych meds: Wellbutrin XL 450 mg AM, fluvoxamine 100 mg in AM and 300 mg HS, lorazepam 1 mg 1-2 mg in the AM and HS and 1 tablet prn midday for anxiety, temazepam 30 mg HS Patient received Spravato 84 mg today.  She tolerated it well without any unusual headache, nausea or vomiting or other somatic symptoms.  Dissociation did occur and she gradually saw resolution over the 2-hour period of observation.  She does not typically find the dissociation very strong. No SE complaints with meds. She has a great deal of stress dealing with her family.  Disc brother's ongoing mania and difficulty getting him help and the stress he causes for the family. She wants to continue Spravato through this very stressful holdicay season and reevaluate the frequency after the New Year.  09/12/22 appt noted: Current psych meds: Wellbutrin XL 450 mg AM, fluvoxamine 100 mg in AM and 300 mg HS, lorazepam 1 mg 1-2 mg in the AM and HS and 1 tablet prn midday for anxiety, temazepam 30 mg HS Patient received Spravato 84 mg today.  She tolerated it well without any unusual headache, nausea or vomiting or other somatic symptoms.  Dissociation did occur and she gradually saw resolution over the 2-hour period of observation.  She does not typically find the dissociation very strong. No SE complaints with meds. She has a great deal of stress dealing with her family.  Disc brother's ongoing mania and difficulty getting him help and the stress he causes for the family. She wants to continue Spravato through this very stressful holdicay season  and reevaluate the frequency after the New Year.  Chronically easily overwhelmed with family. Complaining of HA and history migraine.  Asks for increase imitrex and disc preventatives like propranolol ER  09/26/22 appt noted: Current psych meds: Wellbutrin XL 450 mg AM, fluvoxamine 100 mg in AM and 300 mg HS, lorazepam 1 mg 1-2 mg in the AM and HS and 1 tablet prn midday for anxiety, temazepam 30 mg HS Patient received Spravato 84 mg today.  She tolerated it well without any unusual headache, nausea or vomiting or other somatic symptoms.  Dissociation did occur and she gradually saw resolution over the 2-hour period of observation.  She does not typically find the dissociation very strong. No SE complaints with meds. She has a great deal of stress dealing with her family.  This is ongoing The holidays are much more stressful DT family problems.  She is noting OCD is much worse over the last couple of week.  Depression is better with Spravato. Needed higher dose meds for migraine.   10/03/22 appt noted: Current psych meds: Wellbutrin XL 450 mg AM, fluvoxamine 100 mg in AM and 300 mg HS, lorazepam 1 mg 1-2 mg in the AM and HS and 1 tablet prn midday for anxiety, temazepam 30 mg HS Patient received Spravato 84 mg today.  She tolerated it well without any unusual headache, nausea or vomiting or other somatic symptoms.  Dissociation did occur and she gradually saw resolution over the 2-hour period of observation.  She does not typically find the dissociation very strong. No SE complaints with meds. She has now realized that the rash she previously previously attributed to [facility] was not related.  She is interested may be retrying that after the holidays.  She is tolerating medications otherwise. The holidays remain chronically stressful to her due to family dynamic problems which cause her to consistently feel stuck.  Under more stress her OCD is worse.  She will have a tendency to have crying spells.   The depression and OCD are still improved with Spravato as compared to before.  11/15/22 appt noted: Current psych meds: Wellbutrin XL 450 mg AM, fluvoxamine 100 mg in AM and 300 mg HS, lorazepam 1 mg 1-2 mg in the AM and HS and 1 tablet prn midday for anxiety, temazepam 30 mg HS Patient received Spravato 84 mg today.  She tolerated it well without any unusual headache, nausea or vomiting or other somatic symptoms.  Dissociation did occur and she gradually saw resolution over the 2-hour period of observation.  She does not typically find the dissociation very strong. No SE complaints with meds. Continues to feel depressed and overwhelmed by family problems including her brother's mania and recent eviction and commitment.  Chronic OCD worse when stressed.  No SI.  Tolerating meds. Plan: Per her request continue Wellbutrin XL 450 mg every morning. She has come to the realization that the rash she had previously attributed to [facility] was not related.  She is interested in perhaps retrying Auvelity. There are few alternative medication options that remain.  01/11/23 appt noted: Current psych meds: Wellbutrin XL 150 mg BID and started Auvelity 1 AM, fluvoxamine 100 mg in AM and 300 mg HS, lorazepam 1 mg 1-2 mg in the AM and HS and 1 tablet prn midday for anxiety, temazepam 30 mg HS  Patient received Spravato 84 mg today.  She tolerated it well without any unusual headache, nausea or vomiting or other somatic symptoms.  Dissociation did occur and she gradually saw resolution over the 2-hour period of observation.  She does not typically find the dissociation very strong. No SE complaints with meds. Trouble getting to sessions lately DT transportation problems.   Continues to feel depressed and overwhelmed by OCD and family.  Feels she needs toevery other week Spravato bc it helps for a couple of weeks and then seems to wear off.  Struggleing with OCD and depression both of which are eased by Spravato. Less  crying with Auvelity.  02/07/23 appt noted: Current psych meds: Wellbutrin XL 150 mg BID and started Auvelity 1 AM, fluvoxamine 100 mg in AM and 300 mg HS, lorazepam 1 mg 1-2 mg in the AM and HS and 1 tablet prn midday for anxiety, temazepam 30 mg HS Patient received Spravato 84 mg today.  She tolerated it well without any unusual headache, nausea or vomiting or other somatic symptoms.  Dissociation did occur and she gradually saw resolution over the 2-hour period of observation.  She does not typically find the dissociation very strong. No SE complaints with meds. Trouble getting to sessions lately DT transportation problems.  This is a problem ongoing and thinks she might need to pause Spravato bc won't be able to get her for at least 3 weeks. She is holding pretty steady with a moderate level of anxiety and depression ongoing and chronic.    03/20/23 appt noted: Current psych meds: Wellbutrin XL 150 mg BID and started Auvelity 1 AM, fluvoxamine 100 mg in AM and 300 mg HS, lorazepam 1 mg 1-2 mg in the AM and HS and 1 tablet prn midday for anxiety, temazepam 30 mg HS Patient received Spravato 84 mg today.  She tolerated it well without any unusual headache, nausea or vomiting or other somatic symptoms.  Dissociation did occur and she gradually saw resolution over the 2-hour period of observation.  She does not typically find the dissociation very strong. No SE complaints with meds. She is trying to get back into more regular Spravato administration.  Family issues and transportation problems that led to her missing Spravato.  She feels more depressed without regular Spravato.  Her OCD is chronic but also worse when she misses Spravato.  She does not want any medication changes.  04/03/23 appt noted: Current psych meds: Wellbutrin XL 150 mg BID and started Auvelity 1 AM, fluvoxamine 100 mg in AM and 300 mg HS, lorazepam 1 mg 1-2 mg in the AM and HS and 1 tablet prn midday for anxiety, temazepam 30 mg  HS Patient received Spravato 84 mg today.  She tolerated it well without any unusual headache, nausea or vomiting or other somatic symptoms.  Dissociation did occur and she gradually saw resolution over the 2-hour period of observation.  She does not typically find the dissociation very strong.  It gets rid of negative emotion for awhile after procedure and would like it to last longer.   No SE complaints with meds.  Doesn't really want med changes.   Planning to weekly Spravato.  It is helping dep and anxiety.    04/10/23 appt noted: Current psych meds: Wellbutrin XL 150 mg BID and started Auvelity 1 AM, fluvoxamine 100 mg in AM and 300 mg HS, lorazepam 1 mg 1-2 mg in the AM and HS and 1 tablet prn midday for anxiety, temazepam 30 mg  HS Patient received Spravato 84 mg today.  She tolerated it well without any unusual headache, nausea or vomiting or other somatic symptoms.  Dissociation did occur and she gradually saw resolution over the 2-hour period of observation.  She does not typically find the dissociation very strong.  It gets rid of negative emotion for awhile after procedure and would like it to last longer.   No SE complaints with meds.  Doesn't really want med changes.   Had lidocaine shot for back pain with brief benefit.  Doing some water based PT Spravato went well today. Got pretty upset last week with event related to son's activities.  Got upset with son saying something inappropriate publicly.  Was so embarrassed.  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    He's kind of rebellious lately.  Son is 57 yo.    05/03/23 appt noted: Current psych meds: Wellbutrin XL 150 mg BID and started Auvelity 1 AM, fluvoxamine 100 mg in AM and 300 mg HS, lorazepam 1 mg 1-2 mg in the AM and HS and 1 tablet prn midday for anxiety, temazepam 30 mg HS No SE Patient received Spravato 84 mg today.  She tolerated it well without any unusual headache, nausea or  vomiting or other somatic symptoms.  Dissociation did occur and she gradually saw resolution over the 2-hour period of observation.  She does not typically find the dissociation very strong.  It gets rid of negative emotion for awhile after procedure and would like it to last longer.   Still pleased with benefit from [facility] for mood and anxiety. No SE complaints with meds.  Doesn't really want med changes.   Chronic family px ongoing affects her but nothing she can change.H sick of the family drama.   Continuing counseling helps.  Has some support. Mood and anxiety pretty steady but did go on vacation to [place].  Anxiety worse first in AM and then later at night with mind racing on stressors.   Still back trouble.  05/23/23 appt noted: Current psych meds: Wellbutrin XL 150 mg BID and started Auvelity 1 AM, fluvoxamine 100 mg in AM and 300 mg HS, lorazepam 1 mg 1-2 mg in the AM and HS and 1 tablet prn midday for anxiety, temazepam 30 mg HS No SE Patient received Spravato 84 mg today.  She tolerated it well without any unusual headache, nausea or vomiting or other somatic symptoms.  Dissociation did occur and she gradually saw resolution over the 2-hour period of observation.  She does not typically find the dissociation very strong.  It gets rid of negative emotion for awhile after procedure and would like it to last longer.   Still pleased with benefit from [facility] for mood and anxiety by 50%. No SE complaints with meds. Chronic family stress interferes with mental health and self care.  May have to reduce frequency of Spravato bc of this. But is status quo with meds.  Chronic residual OCD which is mod severe and dep moderate.  05/30/23 appt noted: Current psych meds: Wellbutrin XL 150 mg BID and started Auvelity 1 AM, fluvoxamine 100 mg in AM and 300 mg HS, lorazepam 1 mg 1-2 mg in the AM and HS and 1 tablet prn midday for anxiety, temazepam 30 mg HS No SE Patient received Spravato 84 mg  today.  She tolerated it well without any unusual headache, nausea or vomiting or other somatic symptoms.  Dissociation did occur and she gradually saw resolution over the 2-hour period of observation.  She does not typically find the dissociation very strong.  It gets rid of negative emotion for awhile after procedure and would like it to last longer.   Still pleased with benefit from [facility] for mood and anxiety by 50% or better but not resolved. She wants to continue Spravato as frequently as schedule will allow. Both depression and OCD are better with most improvement in mood.  Asks about any new treatments for OCD. No SE complaints with meds. Chronic family stress interferes with mental health and self care.  This is an ongoing drain on her mood and resources emotionally.    06/06/23 appt noted: Current psych meds: Wellbutrin XL 150 mg BID and started Auvelity 1 AM, fluvoxamine 100 mg in AM and 300 mg HS, lorazepam 1 mg 1-2 mg in the AM and HS and 1 tablet prn midday for anxiety, temazepam 30 mg HS No SE Patient received Spravato 84 mg today.  She tolerated it well without any unusual headache, nausea or vomiting or other somatic symptoms.  Dissociation did occur and she gradually saw resolution over the 2-hour period of observation.  She does not typically find the dissociation very strong.  It gets rid of negative emotion for awhile after procedure and would like it to last longer.   Still believes each meds work and are helpful and well tolerated.  Specifically she thinks that the Auvelity adds additional benefit on taking Wellbutrin.  She believes the meds help both depression and OCD though the OCD remains a problem.  She also believes Spravato helps both types of symptoms as well.  Her mood is variable.  She experiences a great deal of stress from her family and those problems wax and wane.  She has bad days because of it and today is 1 of those days.  She is continuing counseling and that is  helpful.  Due to family demands she may have to spread out the Spravato frequency.  06/16/23 appt noted:  Current psych meds: Wellbutrin XL 150 mg BID and started Auvelity 1 AM, fluvoxamine 100 mg in AM and 300 mg HS, lorazepam 1 mg 1-2 mg in the AM and HS and 1 tablet prn midday for anxiety, temazepam 30 mg HS No SE Patient received Spravato 84 mg today.  She tolerated it well without any unusual headache, nausea or vomiting or other somatic symptoms.  Dissociation did occur and she gradually saw resolution over the 2-hour period of observation.  She does not typically find the dissociation very strong.  It gets rid of negative emotion for awhile after procedure . She is trying to make the Schedule work for [employer] oh every week or every other week because she is aware the treatment effects can be lost if it is time less frequently.  So she has been able to come the last 2 weeks, we will try to come in 2 weeks.  Spravato reduces depression and OCD significantly though she remains highly symptomatic with both.  However she has no suicidal thoughts.  Crying spells are reduced with Spravato.  She still spends a fair amount of time meaning over an hour a day checking and more under stress or when tired.  She asks about any new meds for OCD. Plan: increase Auvelity 1 in AM & PM Hold Wellbutrin XL 150 mg BID  07/12/23 appt noted: Current psych meds: Wellbutrin XL 150 mg BID and started [employer]  1 AM, fluvoxamine 100 mg in AM and 300 mg HS, lorazepam 1 mg 1-2 mg in the AM and HS and 1 tablet prn midday for anxiety, temazepam 30 mg HS.  Didn't increase Auvelity yet No SE Patient received Spravato 84 mg today.  She tolerated it well without any unusual headache, nausea or vomiting or other somatic symptoms.  Dissociation did occur and she gradually saw resolution over the 2-hour period of observation.  She does not typically find the dissociation very strong.  It gets rid of negative emotion for awhile after  procedure . She has not increased the Auvelity yet is planned.  She has a little bit of nervousness about it but admits is not rational.  She is willing to give that a try to try to get better control of depression and anxiety.  She continues to see the benefit of Spravato every other week.  She is struggling with some pain due to plantar fasciitis and given her cerebral palsy that makes it even more difficult to walk and to engage in normal activities.  She is willing to seek treatment for that.  07/25/23 appt noted: Current psych meds: Wellbutrin XL 150 mg AM and started Auvelity 1 BID, fluvoxamine 100 mg in AM and 300 mg HS, lorazepam 1 mg 1-2 mg in the AM and HS and 1 tablet prn midday for anxiety, temazepam 30 mg HS.  Didn't increase Auvelity yet No SE Patient received Spravato 84 mg today.  She tolerated it well without any unusual headache, nausea or vomiting or other somatic symptoms.  Dissociation did occur and she gradually saw resolution over the 2-hour period of observation.  She does not typically find the dissociation very strong.  It gets rid of negative emotion for awhile after procedure . Chronic dep and anxiety to some extent.  But has increased Auvelity to BID recently and no SE yet.  Disc frequency Spravato bc benefit and will try to continue every other week and not less if possible. No further concerns for meds.  08/08/23 appt noted: Current psych meds: Wellbutrin XL 150 mg AM and Auvelity 1 BID, fluvoxamine 100 mg in AM and 300 mg HS, lorazepam 1 mg 1-2 mg in the AM and HS and 1 tablet prn midday for anxiety, temazepam 30 mg HS.  Didn't increase Auvelity yet No SE with med changes. Patient received Spravato 84 mg today.  She tolerated it well without any unusual headache, nausea or vomiting or other somatic symptoms.  Dissociation did occur and she gradually saw resolution over the 2-hour period of observation.  She does not typically find the dissociation very strong.  It gets rid  of negative emotion for awhile after procedure . Mornings still worse with dep and anxiety.  But mood and anxiety are better with the increase in Auvelity to BID.  OCD seems a little better.  Chronic difficulty handling the complaints and stress from her family.  Easily stresser her out.  No med changes desire. She will have to take a break from [facility] DT pending foot surgery.  Mobility and transportation will be limited.  08/25/23 appt noted: Current psych meds: Wellbutrin XL 150 mg AM and Auvelity 1 BID, fluvoxamine 100 mg in AM and 300 mg HS, lorazepam 1 mg 1-2 mg in the AM and HS and 1 tablet prn midday for anxiety, temazepam 30 mg HS.  Didn't increase Auvelity yet No SE with med changes. Patient received Spravato 84 mg today.  She tolerated it well  without any unusual headache, nausea or vomiting or other somatic symptoms.  Dissociation did occur and she gradually saw resolution over the 2-hour period of observation.  She does not typically find the dissociation very strong.  It gets rid of negative emotion for awhile after procedure . Pending foot surgery next week.  Able to work in [employer] this week which helps with dep and anxiety and OCD.  Struggles with sleep without multiple meds as noted.  No adverse effects.  Will return to [facility] asap after foot surgery  Previous psych med trials include Prozac, paroxetine, sertraline, fluvoxamine, venlafaxine, Anafranil with no response,  Wellbutrin, , Viibryd, Trintellix 10 1 month NR Auvelity BID  Geodon,  risperidone, Rexulti, Abilify,  Seroquel, Latuda 40 mg with irritability.  lamotrigine lithium,  BuSpar, Namenda,  pramipexole with no response, and Topamax, pindolol  ECT-MADRS    Flowsheet Row Office Visit from 06/29/2021 in [facility]  MADRS Total Score 36      Flowsheet Row Admission (Discharged) from 06/11/2021 in [facility]  C-SSRS RISK CATEGORY No Risk        Review of  Systems:  Review of Systems  Constitutional:  Positive for fatigue.  Cardiovascular:  Negative for palpitations.  Musculoskeletal:  Positive for arthralgias, back pain, gait problem, myalgias and neck pain.  Neurological:  Positive for weakness and headaches.  Psychiatric/Behavioral:  Positive for dysphoric mood and sleep disturbance. Negative for suicidal ideas. The patient is nervous/anxious.     Medications: I have reviewed the patient's current medications.  Current Outpatient Medications  Medication Sig Dispense Refill   Abaloparatide (TYMLOS) 3120 MCG/1.56ML SOPN Inject into the skin.     Azelastine-Fluticasone 137-50 MCG/ACT SUSP Place 1-2 sprays into both nostrils daily.     baclofen (LIORESAL) 10 MG tablet Take 20 mg by mouth at bedtime as needed for muscle spasms.     buPROPion (WELLBUTRIN XL) 150 MG 24 hr tablet Take 150 mg by mouth daily.     Dextromethorphan-buPROPion ER (AUVELITY) 45-105 MG TBCR Take 1 tablet by mouth in the morning and at bedtime. 60 tablet 1   dicyclomine (BENTYL) 10 MG capsule Take 10 mg by mouth daily.     docusate sodium (COLACE) 100 MG capsule Take 1 capsule (100 mg total) by mouth 2 (two) times daily. (Patient taking differently: Take 100 mg by mouth daily.) 10 capsule 0   Esketamine HCl, 84 MG Dose, (SPRAVATO, 84 MG DOSE,) 28 MG/DEVICE SOPK USE 3 SPRAYS IN EACH NOSTRIL ONCE A WEEK 3 each 0   fexofenadine (ALLEGRA) 180 MG tablet Take 180 mg by mouth daily.     fluvoxaMINE (LUVOX) 100 MG tablet TAKE 1 TABLET IN THE AM AND 3 TABLETS AT NIGHT 360 tablet 0   hydrocortisone (ANUSOL-HC) 2.5 % rectal cream Place rectally 2 (two) times daily. x 7-14 days 30 g 0   ketotifen (ZADITOR) 0.025 % ophthalmic solution Place 3 drops into both eyes at bedtime.     LORazepam (ATIVAN) 1 MG tablet TAKE 1-2 IN THE AM AND 1-2 TABLETS EVERY NIGHT AT BEDTIME AND 1 TABLET IN AFTERNOON when needed for anxiety and sleep 150 tablet 2   magnesium gluconate (MAGONATE) 500 MG tablet  Take 500 mg by mouth daily.     MIBELAS 24 FE 1-20 MG-MCG(24) CHEW Chew 1 tablet by mouth at bedtime as needed (bowel regularity).     Multiple Vitamins-Minerals (ADULT GUMMY PO) Take 2 tablets by mouth in the morning.  nitrofurantoin (MACRODANTIN) 100 MG capsule Take 100 mg by mouth as needed (For urinary tract infection.).      oxyCODONE-acetaminophen (PERCOCET/ROXICET) 5-325 MG tablet Take 1-2 tablets by mouth every 6 (six) hours as needed for severe pain. 50 tablet 0   polyethylene glycol (MIRALAX / GLYCOLAX) packet Take 17 g by mouth daily as needed for mild constipation. 14 each 0   propranolol ER (INDERAL LA) 60 MG 24 hr capsule TAKE 1 CAPSULE BY MOUTH EVERY DAY 30 capsule 5   psyllium (METAMUCIL) 58.6 % powder Take 1 packet by mouth daily as needed (constipation).     SUMAtriptan (IMITREX) 100 MG tablet Take 1 tablet (100 mg total) by mouth every 2 (two) hours as needed for migraine. May repeat in 2 hours if headache persists or recurs. 9 tablet 2   temazepam (RESTORIL) 30 MG capsule Take 1 capsule by mouth at bedtime as needed for sleep 30 capsule 1   Vitamin D-Vitamin K (VITAMIN K2-VITAMIN D3 PO) Take 1-2 sprays by mouth daily.     No current facility-administered medications for this visit.    Medication Side Effects: None   Allergies:  Allergies  Allergen Reactions   Hydrocodone Itching   Sulfamethoxazole-Trimethoprim Itching   Dust Mite Extract Other (See Comments)    Sneezing, watery eyes, runny nose   Latex Itching   Other Other (See Comments)    PT IS ALLERGIC TO CAT DANDER AND RAGWEED - Sneezing, watery eyes, runny nose    Pollen Extract Other (See Comments)    Sneezing, watery eyes, runny nose     Past Medical History:  Diagnosis Date   Abnormal Pap smear 2011   hpv/mild dysplasia,cin1   Anxiety    Cerebral palsy (HCC)    right arm/leg   Cystocele    Depression    Headache    Neuromuscular disorder (HCC)    Cerebral Palsy   OCD (obsessive compulsive  disorder)    Osteoporosis    Uterine prolaps     Family History  Problem Relation Age of Onset   Cancer Father        skin AND LUNG   Alcohol abuse Sister        CRACK COCAINE    Social History   Socioeconomic History   Marital status: Married    Spouse name: Not on file   Number of children: Not on file   Years of education: Not on file   Highest education level: Not on file  Occupational History   Not on file  Tobacco Use   Smoking status: Never   Smokeless tobacco: Never  Substance and Sexual Activity   Alcohol use: Not Currently    Comment: OCCASIONAL beer   Drug use: No   Sexual activity: Yes    Birth control/protection: Pill    Comment: LOESTRIN 24 FE  Other Topics Concern   Not on file  Social History Narrative   Not on file   Social Determinants of Health   Financial Resource Strain: Not on file  Food Insecurity: Not on file  Transportation Needs: Not on file  Physical Activity: Not on file  Stress: Not on file  Social Connections: Not on file  Intimate Partner Violence: Not on file    Past Medical History, Surgical history, Social history, and Family history were reviewed and updated as appropriate.   Please see review of systems for further details on the patient's review from today.   Objective:   Physical Exam:  LMP  (  LMP Unknown)   Physical Exam Constitutional:      General: She is not in acute distress. Neurological:     Mental Status: She is alert and oriented to person, place, and time.     Cranial Nerves: No dysarthria.     Motor: Weakness present.     Gait: Gait abnormal.  Psychiatric:        Attention and Perception: Attention and perception normal.        Mood and Affect: Mood is anxious and depressed.        Speech: Speech normal. Speech is not slurred.        Behavior: Behavior normal. Behavior is cooperative.        Thought Content: Thought content normal. Thought content is not delusional. Thought content does not include  homicidal or suicidal ideation. Thought content does not include suicidal plan.        Cognition and Memory: Cognition and memory normal. Cognition is not impaired.        Judgment: Judgment normal.     Comments: Insight intact Ongoing OCD remains fairly severe but less anxious Checking compulsions up to 2 hours daily but improved noticeably with Spravato Chronic depression persistent but better with Spravato  and helps OCD too        Lab Review:     Component Value Date/Time   NA 138 06/11/2021 0606   K 4.0 06/11/2021 0606   CL 107 06/11/2021 0606   CO2 26 06/11/2021 0606   GLUCOSE 90 06/11/2021 0606   BUN 18 06/11/2021 0606   CREATININE 0.81 06/11/2021 0606   CALCIUM 9.4 06/11/2021 0606   PROT 6.5 06/11/2021 0606   ALBUMIN 3.3 (L) 06/11/2021 0606   AST 17 06/11/2021 0606   ALT 14 06/11/2021 0606   ALKPHOS 141 (H) 06/11/2021 0606   BILITOT 0.2 (L) 06/11/2021 0606   GFRNONAA >60 06/11/2021 0606   GFRAA >60 07/09/2016 0438       Component Value Date/Time   WBC 5.8 06/11/2021 0606   RBC 4.12 06/11/2021 0606   HGB 12.5 06/11/2021 0606   HCT 39.7 06/11/2021 0606   PLT 299 06/11/2021 0606   MCV 96.4 06/11/2021 0606   MCH 30.3 06/11/2021 0606   MCHC 31.5 06/11/2021 0606   RDW 13.9 06/11/2021 0606   LYMPHSABS 1.9 06/11/2021 0606   MONOABS 0.5 06/11/2021 0606   EOSABS 0.1 06/11/2021 0606   BASOSABS 0.0 06/11/2021 0606    No results found for: "POCLITH", "LITHIUM"   No results found for: "PHENYTOIN", "PHENOBARB", "VALPROATE", "CBMZ"   .res Assessment: Plan:    Casey "[name]" was seen today for follow-up, depression, anxiety, sleeping problem and stress.  Diagnoses and all orders for this visit:  Recurrent major depression resistant to treatment [facility])  Mixed obsessional thoughts and acts  Social anxiety disorder  Insomnia due to mental condition  Migraine without aura and without status migrainosus, not intractable  Caregiver stress    Both primary Dx of  OCD and major depression are TR and marked.  Impaired function but less so with Spravato re: depression..   She is receiving Spravato 84 mg weekly and moderate improvement in the depression..  she feels it also helps OCD somewhat.  However still easily overwhelmed with low stress tolerance.  Family contributes to her anxiety and stress markedly.  The OCD is improved with the increase in fluvoxamine and with Spravato.  Spends up to 2 hours daily and checking compulsions on her worst days but better  when she travels.  No new options for tx are evident.  She has been on higher doses of fluvoxamine above the usual max of 400 mg daily in the past and finds it more helpful at the higher dose.    Disc SE.   She is tolerating the meds well  Continue  Luvox back to 400 mg nightly as of January 2023. Disc dosing higher than usual.  She feels this is increase has helped more with OCD which remains chronically severe.  Continue Auvelity BID and reduced Wellbutrin to 1 AM. It has helped and is tolerated.   We have discussed seizure risk that is possible using this combination but given severity of her symptoms she feels the risk is warranted. There are few other alternative medication options that remain.   Disc DDI issues.   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 She doesn't want to try this now.  Disc Spravato DT TRD incl details and SE. Disc dosing and duration.  Pt with severe depression MADRS 36 on 06/29/21  Patient was administered Spravato 84 mg intranasally dosage today.  The patient experienced the typical dissociation which gradually resolved over the 2-hour period of observation.  There were no complications.  Specifically the patient did not have nausea or vomiting or headache.  Blood pressures remained within normal ranges at the 40-minute and 2-hour follow-up intervals.  By the time the 2-hour observation period was met the patient was alert and oriented and able to  exit without assistance. She tends to have lingering sedative effects but not severe. .  See nursing note for further details. Try to continue every other week Spravato but this will be on hold DT pending foot surgery  Didn't feel as well when off spravato but disc family issues interfering with frequency.  We discussed the short-term risks associated with benzodiazepines including sedation and increased fall risk among others.  Discussed long-term side effect risk including dependence, potential withdrawal symptoms, and the potential eventual dose-related risk of dementia.  But recent studies from 2020 dispute this association between benzodiazepines and dementia risk. Newer studies in 2020 do not support an association with dementia. Disc this is high dose and not ideal.  Also disc risk combining it with temazepam. Rec try gradually reduce HS lorazepam to 1 mg Hs.  Can continue lorazepam 2 mg AM and 1 mg in afternoon bc of chronic anxiety and it is helpful and tolerated. She can continue temazepam 30 mg nightly.  She tends to have a lot of anxious negative thoughts at night when she is trying to go to bed.  She is trying to reduce the dose.  Complaining of HA and history migraine.  Asks for increase imitrex and disc preventatives like propranolol ER imitrex to 100 mg prn migraine and propranolol ER for migraine prevention.  Supportive therapy dealing with some of the recent stressors including son's autism and recent issues of rebelliousness with him.    No med changes continue Auvelity 1 in AM & PM Reduced  Wellbutrin XL 150 mg AM Fluvoxamine 100 mg AM and 300 mg PM above usual max bc med necessary Lorazepam 2 mg HS and prn 1 mg BID prn anxiety daily Temazepam 30 mg HS Propranolol ER 60 mg daily Imitrex prn migraine  Follow-up every week if possible and if note every other week and try to be consistent. That is her plan but family demands do interfere at times.     Iona Hansen  [name], MD,  DFAPA  Please see After Visit Summary for patient specific instructions.  Future Appointments  Date Time Provider Department Center  09/20/2023  6:00 PM Robley Fries, PhD [facility] None  10/24/2023 11:00 AM Cottle, Steva Ready., MD [facility] None    No orders of the defined types were placed in this encounter.    -------------------------------

## 2023-08-30 ENCOUNTER — Ambulatory Visit: Payer: 59 | Admitting: Psychiatry

## 2023-09-11 ENCOUNTER — Other Ambulatory Visit: Payer: Self-pay | Admitting: Psychiatry

## 2023-09-11 DIAGNOSIS — F339 Major depressive disorder, recurrent, unspecified: Secondary | ICD-10-CM

## 2023-09-13 ENCOUNTER — Other Ambulatory Visit: Payer: Self-pay

## 2023-09-13 ENCOUNTER — Telehealth: Payer: Self-pay | Admitting: Psychiatry

## 2023-09-13 DIAGNOSIS — F4010 Social phobia, unspecified: Secondary | ICD-10-CM

## 2023-09-13 NOTE — Telephone Encounter (Signed)
Pt is requesting a RF on [facility]epam . [id number] [place], [place] - [address]  [address] [place],

## 2023-09-13 NOTE — Telephone Encounter (Signed)
Pended lorazepam to requested pharmacy.

## 2023-09-18 ENCOUNTER — Telehealth: Payer: Self-pay | Admitting: Psychiatry

## 2023-09-18 ENCOUNTER — Other Ambulatory Visit: Payer: Self-pay

## 2023-09-18 DIAGNOSIS — F339 Major depressive disorder, recurrent, unspecified: Secondary | ICD-10-CM

## 2023-09-18 MED ORDER — LORAZEPAM 1 MG PO TABS: ORAL_TABLET | ORAL | 0 refills | Status: DC

## 2023-09-18 MED ORDER — AUVELITY 45-105 MG PO TBCR: 1.0000 | EXTENDED_RELEASE_TABLET | Freq: Two times a day (BID) | ORAL | 0 refills | Status: DC

## 2023-09-18 NOTE — Telephone Encounter (Signed)
RX  RF on Auvelity needed.  [facility] [address]: [id number] [place], [place] - [address]  [address] [place], [place]

## 2023-09-18 NOTE — Telephone Encounter (Signed)
Sent Auvelity to requested pharmacy.

## 2023-09-18 NOTE — Telephone Encounter (Signed)
Pt lvm that she is still waiting for her a refill on her lorazapam

## 2023-09-19 ENCOUNTER — Other Ambulatory Visit: Payer: Self-pay | Admitting: Psychiatry

## 2023-09-19 DIAGNOSIS — G43009 Migraine without aura, not intractable, without status migrainosus: Secondary | ICD-10-CM

## 2023-09-20 ENCOUNTER — Ambulatory Visit: Payer: 59 | Admitting: Psychiatry

## 2023-09-21 ENCOUNTER — Ambulatory Visit: Payer: 59 | Admitting: Psychiatry

## 2023-09-22 ENCOUNTER — Ambulatory Visit (INDEPENDENT_AMBULATORY_CARE_PROVIDER_SITE_OTHER): Payer: 59 | Admitting: Psychiatry

## 2023-09-22 DIAGNOSIS — F422 Mixed obsessional thoughts and acts: Secondary | ICD-10-CM

## 2023-09-22 DIAGNOSIS — G809 Cerebral palsy, unspecified: Secondary | ICD-10-CM

## 2023-09-22 DIAGNOSIS — Z638 Other specified problems related to primary support group: Secondary | ICD-10-CM

## 2023-09-22 DIAGNOSIS — F339 Major depressive disorder, recurrent, unspecified: Secondary | ICD-10-CM | POA: Diagnosis not present

## 2023-09-22 DIAGNOSIS — F4010 Social phobia, unspecified: Secondary | ICD-10-CM

## 2023-09-22 DIAGNOSIS — Z636 Dependent relative needing care at home: Secondary | ICD-10-CM

## 2023-09-22 NOTE — Patient Instructions (Addendum)
Priority recommendations today: With [name] --  Sounds like main point is to tell him it's always OK to object to something, but ask him to stay kind in how he does it.   If you actually feel suicidal and pushed beyond your strength, then please let him know that, don't try to keep it to yourself.  You know he's not not the enemy and that he can listen. If you think [name] needs to know it, then let him know not to lose faith in your willingness to represent money -- you've been trying to think of other ways to get Mom and [name] the money it takes without sponging off family (things like, pursuing Kathlene November about the inheritance, and getting Mom in touch with public services).   When you take Mom's calls outside, that's not you hiding, that's you doing your sensitive husband a favor, because you love him like that and mean to spare him and you the conflict.  Period. With Mom --  It make sense to reiterate to her that the only Xmas present that would want from her is for her to make her own ends meet or start financial counseling -- for real -- with [facility]. As far as doing an intervention with Mom and [name], you have the OPTION to go it alone or ASK siblings to join you.  If you ask them, it's absolutely legitimate, and so is their right to sign on or not sign on to help.  Try to be ready to be as authentic and clear if it's just you as you could be if it's all 3 of you.   If you're going to organize an intervention with [name], you know you will also have to do one with your mom for ways she enables him.  Most important that the confronting people be only the ones who can lay down a rule and hold to it (as opposed to sniping or caving in) and that whatever you tell them to set boundaries, it is most about what you will do if, least about what they "should " do or "should know.  You're spelling out contingencies, not preaching. By far best for your mom to basically make her get in touch with public services,  even if we doubt they can come through.  She needs the practice humbling, and you need the breathing room.   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 With OCD -- Always and still true, your intrusive thoughts dial up when the stress in your life is up.  It doesn't mean they're true, and it doesn't mean you "have to" do anytthing about them. And if it can help you, try this out:  OCD Therapy as Dr. Steffanie Rainwater Might Have Put It  Just 'cause it's LOUD doesn't mean that it's true. Just 'cause it's UGLY won't mean that it's you. Just 'cause you THINK it won't mean that you mean it. And just 'cause it's HERE doesn't mean you must clean it.  Of course it's still hard when you cringe or you fear it, But cure comes in how you APPROACH, and what spirit. The FEELING will tell you to fight, fix, or flee, But STAY PUT and see, and you'll start to work free.  So you clean or consider or check with someone? Approach at a walk, then be done at a run. You can "Tease the Disease" when you're up for more daring, And "  Do the Taboo!".  Tell your mind, "Thanks for sharing..."  SHOW UP and feel it, no need to explain. COPING's the secret to changing your brain. So know -- when you LET those thoughts just come and pass, THAT's when you REALLY kick OCD's ass.
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
## 2023-09-26 ENCOUNTER — Encounter: Payer: 59 | Admitting: Psychiatry

## 2023-09-28 ENCOUNTER — Telehealth: Payer: Self-pay | Admitting: Psychiatry

## 2023-09-28 NOTE — Telephone Encounter (Signed)
Casey Diaz called at 2:00 wanting to know if she can come for spravato next Tuesday.  Please call her.

## 2023-09-29 ENCOUNTER — Other Ambulatory Visit: Payer: Self-pay

## 2023-09-29 ENCOUNTER — Telehealth: Payer: Self-pay | Admitting: Psychiatry

## 2023-09-29 ENCOUNTER — Ambulatory Visit (INDEPENDENT_AMBULATORY_CARE_PROVIDER_SITE_OTHER): Payer: 59 | Admitting: Psychiatry

## 2023-09-29 DIAGNOSIS — F422 Mixed obsessional thoughts and acts: Secondary | ICD-10-CM | POA: Diagnosis not present

## 2023-09-29 DIAGNOSIS — F4010 Social phobia, unspecified: Secondary | ICD-10-CM | POA: Diagnosis not present

## 2023-09-29 DIAGNOSIS — F339 Major depressive disorder, recurrent, unspecified: Secondary | ICD-10-CM

## 2023-09-29 DIAGNOSIS — Z636 Dependent relative needing care at home: Secondary | ICD-10-CM | POA: Diagnosis not present

## 2023-09-29 DIAGNOSIS — Z638 Other specified problems related to primary support group: Secondary | ICD-10-CM

## 2023-09-29 DIAGNOSIS — F5105 Insomnia due to other mental disorder: Secondary | ICD-10-CM

## 2023-09-29 MED ORDER — TEMAZEPAM 30 MG PO CAPS: ORAL_CAPSULE | ORAL | 0 refills | Status: DC

## 2023-09-29 NOTE — Telephone Encounter (Signed)
[facility] is [id number] [place], [place] - [address] [employer]

## 2023-09-29 NOTE — Progress Notes (Unsigned)
Psychotherapy Progress Note [facility] [name], PhD LP  Patient ID: Casey Diaz (Casey "[name]")    MRN: [id number] Therapy format: Individual psychotherapy Date: 09/29/2023      Start: 1:15p     Stop: ***:***     Time Spent: *** min Location: In-person   Session narrative (presenting needs, interim history, self-report of stressors and symptoms, applications of prior therapy, status changes, and interventions made in session) Ed declined the intervention meeting, so [name] and [name] ran it themselves.  May have some progress, as M wants him out, [name] is making steady money, and he in fact wants his own space.  Other than that, wakes up angry nearly every day at being in the spot she's in, between disabilities and Paul/Mom and the more detached relationship she has with hr ASD son.  Discussed at some length the collction of troubles and the habit of collecting thm, espcialy if she's waking up in pain or dread of today's rsponsiblities.    Mhas been making ahabit of talking about her funeraal, etc.    Therapeutic modalities: {AM:23362::"Cognitive Behavioral Therapy","Solution-Oriented/Positive Psychology"}  Mental Status/Observations:  Appearance:   {PSY:22683}     Behavior:  {PSY:21022743}  Motor:  {PSY:22302}  Speech/Language:   {PSY:22685}  Affect:  {PSY:22687}  Mood:  {PSY:31886}  Thought process:  {PSY:31888}  Thought content:    {PSY:[phone]}  Sensory/Perceptual disturbances:    {[phone]}  Orientation:  {Psych Orientation:23301::"Fully oriented"}  Attention:  {Good-Fair-Poor ratings:23770::"Good"}    Concentration:  {Good-Fair-Poor ratings:23770::"Good"}  Memory:  {[phone]}  Insight:    {Good-Fair-Poor ratings:23770::"Good"}  Judgment:   {Good-Fair-Poor ratings:23770::"Good"}  Impulse Control:  {Good-Fair-Poor ratings:23770::"Good"}   Risk Assessment: Danger to Self: {Risk:22599::"No"} Self-injurious Behavior: {Risk:22599::"No"} Danger  to Others: {Risk:22599::"No"} Physical Aggression / Violence: {Risk:22599::"No"} Duty to Warn: {AMYesNo:22526::"No"} Access to Firearms a concern: {AMYesNo:22526::"No"}  Assessment of progress:  {Progress:22147::"progressing"}  Diagnosis: No diagnosis found. Plan:  *** Other recommendations/advice -- As may be noted above.  Continue to utilize previously learned skills ad lib. Medication compliance -- Maintain medication as prescribed and work faithfully with relevant prescriber(s) if any changes are desired or seem indicated. Crisis service -- Aware of call list and work-in appts.  Call the clinic on-call service, 988/hotline, 911, or present to [facility] or ER if any life-threatening psychiatric crisis. Followup -- Return for add 11am Thurs 12/19.  Next scheduled visit with me 11/17/2023.  Next scheduled in this office 09/29/2023.  Robley Fries, PhD [name], PhD LP Clinical Psychologist, [facility] [address] [place], [place] [address] [phone]

## 2023-09-29 NOTE — Telephone Encounter (Signed)
PENDED RESTORIL TO REQUESTED PHARMACY.

## 2023-10-03 ENCOUNTER — Telehealth: Payer: Self-pay

## 2023-10-03 ENCOUNTER — Ambulatory Visit: Payer: 59

## 2023-10-03 ENCOUNTER — Ambulatory Visit: Payer: 59 | Admitting: Psychiatry

## 2023-10-03 VITALS — BP 106/73 | HR 67

## 2023-10-03 DIAGNOSIS — G43009 Migraine without aura, not intractable, without status migrainosus: Secondary | ICD-10-CM

## 2023-10-03 DIAGNOSIS — Z636 Dependent relative needing care at home: Secondary | ICD-10-CM

## 2023-10-03 DIAGNOSIS — F4010 Social phobia, unspecified: Secondary | ICD-10-CM

## 2023-10-03 DIAGNOSIS — F5105 Insomnia due to other mental disorder: Secondary | ICD-10-CM

## 2023-10-03 DIAGNOSIS — F339 Major depressive disorder, recurrent, unspecified: Secondary | ICD-10-CM

## 2023-10-03 DIAGNOSIS — F422 Mixed obsessional thoughts and acts: Secondary | ICD-10-CM | POA: Diagnosis not present

## 2023-10-04 ENCOUNTER — Encounter: Payer: Self-pay | Admitting: Psychiatry

## 2023-10-04 NOTE — Progress Notes (Signed)
Casey Diaz [id number] 10/13/1966 57 y.o.    Subjective:   Patient ID:  Casey Diaz is a 57 y.o. (DOB [birth year]) female.  Chief Complaint:  Chief Complaint  Patient presents with   Follow-up   Depression   Anxiety   Fatigue   Sleeping Problem     HPI Casey Diaz presents to the office today for follow-up of OCD and severe anxiety.     December 2019 visit the following was noted: No meds were changed. Lives in [name] and back for followup.  Sx are about the same.  Has to take meds with different sizes. Pt reports that mood is Anxious and Depressed and describes anxiety as Severe. Anxiety symptoms include: Excessive Worry, Obsessive Compulsive Symptoms:   Checking,,. Pt reports has interrupted sleep and nocturia. Pt reports that appetite is good. Pt reports that energy is no change and down slightly. Concentration is down slightly. Suicidal thoughts:  denied by patient. Loves the environment of [name] but misses some things there.  She's not able to work there.  H works there and likes it.  Struggled with not working, feels isolated and not up to task of meeting people.  Does attend a church and met a friend who's been helpful.  Leaving for [name] on 10/16/18.   04/09/2020 appointment the following is noted:  Staying another year in [name] bc Covid and other things. Last few months a lot of crying spells.  Is in menopause. Wonders about med changes though is nervous about it.  Crying spells associated with depressing thoughts more than stress or OCD.   Covid really hard on everyone and couldn't see family for 18 mos.  Family still very dysfunctional. No close friends in part due to OCD and depression. Son high Autism spectrum with ADHD and anxiety and she's with him all the time. Greater health problems with CP so more pains.   05/15/20 appt with the following noted: Casey Diaz for menopause and helps some. Still depressed.  Chronically. In [place] for 2 more weeks  then to [name] for another year. A lot of stressors lately triggering more checking and anxiety.   OCD is her CC now and seems.  Got worse DT stress.   Stressed with Asberger's son and her health.  H works a lot.  Her FOO still stress. Plan: Trintellix 10 mg 1 tablet in the morning with food and reduce fluvoxamine to 5 tablets nightly for 1 week  then reduce it to 4 tablets nightly.   07/02/20 appt with the following noted: Decided not to get Trintellix bc difficulty getting it. It is available.  There.  Wants to start it now.   Both depression and OCD are severe.  Not suicidal in intent or plan. Did not take samples with her to [name] but will be back in December. covid is worse there and travel is difficult.  Wants to reduce Wellbutrin DT dry mouth. Plan: She's afraid to reduce Luvox at this time DT fear of worsening OCD.  But will consider. Trintellix 10 mg 1 tablet in the morning with food and reduce fluvoxamine to 5 tablets nightly for 1 week  then reduce it to 4 tablets nightly. Also reduce Wellbutrin XL to 300 mg daily.    9-13 2022 appointment with the following noted: Back in [place] since July 14.  Broke arm a month ago and surgery.  It's all been rough adjustment.   B has cancer on his face and M fell taking him  to the doctor.  Misses the water and weather of [name].   Cry a lot more since menopause. Still depression and anxiety and OCD.  Asks about ketamine. On Wellbutrin 300, Luvox 300.  No Trintellix. Added Ativan 2 mg AM and HS and it helps.  More likely to get upset at night. Plan: Increase Luvox back to 400 mg daily.  She thinks she's worse on less. Continue Wellbutrin XL to 300 mg daily. Plan to start Spravato for TRD asap   09/27/2021 appointment with the following noted:  She has started Spravato today at 55 mg intranasally.  She tolerated it well without unusual nausea or vomiting headache or other somatic symptoms.  She did have the expected dissociation which  gradually resolved over the course of the 2-hour period of observation.  She was a little concerned about her balance given her cerebral palsy but has not noted unusual or unexpected problems.  She is motivated to can continue Spravato in hopes of reducing her depressive symptoms. 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  She also has treatment resistant OCD which is partially managed with medications but is still quite disabling.  She is tolerating the medications well.  She is sleeping adequately.  Her appetite is adequate.  She is not having suicidal thoughts.  She continues to wish for a better treatment for OCD that would give her some relief.  09/30/2021 appointment with the following noted: She received her first dose of Spravato 84 mg intranasally today.  She tolerated it well without unusual nausea, vomiting, or other somatic symptoms.  Dissociation as expected did occur and gradually resolved over the 2-hour period of observation.  She did have a mild headache today with the treatment and received ibuprofen 600 mg at her request.  We will follow this to see if it is a pattern Patient is still depressed.  She said she was late with her medicine today and today was a particularly depressing day.  However she notes that the Spravato has lifted her mood considerably even today.  She is hopeful that it will continue to be helpful.  No suicidal thoughts.  She has ongoing chronic anxiety and OCD at baseline.  10/04/21 appt noted: Patient received Spravato 84 mg for the second time today.  She tolerated it well without any unusual headache, nausea or vomiting or other somatic symptoms.  Dissociation did occur and she gradually [place] resolution over the 2-hour period of observation. She did not have any unusual problems after she left the office last Spravato administration.  She did not have any specific problems with balance or walking.  She is at increased risk of  that difficulty because of cerebral palsy.  So far she has not noticed much mood effect from the medication beyond the first day of receiving it.  However she would like to continue Spravato in hopes of getting the antidepressant effect that is desired. Stress dealing with mother's behavior at party pt hosted.  Guilt over it.  10/07/2021 appointment noted: Patient received Spravato 84 mg for the second time today.  She tolerated it well without any unusual headache, nausea or vomiting or other somatic symptoms.  Dissociation did occur and she gradually [employer] resolution over the 2-hour period of observation. She still is not sure about the antidepressant effect of Spravato.  Events over the holidays and demands, make it difficult to assess.  She still notes that the OCD tends to worsen the depression and vice versa.  She tolerates the Spravato well and wants to continue the trial.  10/15/2021 appointment with the following noted: Patient received Spravato 84 mg for the second time today.  She tolerated it well without any unusual headache, nausea or vomiting or other somatic symptoms.  Dissociation did occur and she gradually [place] resolution over the 2-hour period of observation. Patient says it was somewhat difficult to evaluate the effect of the Spravato.  It was scheduled to be twice weekly for 4 weeks consecutively but the holidays have interfered with that administration.  She asked what specifically should be she should be looking for in order to assess improvement.  That was discussed.  The OCD is unchanged and the depression so far is not significantly different.  She still tolerates meds.  There have been no recent med changes  10/19/2021 appt noted: Patient received Spravato 84 mg for the second today.  She tolerated it well without any unusual headache, nausea or vomiting or other somatic symptoms.  Dissociation did occur and she gradually saw resolution over the 2-hour period of observation.    10/21/2021 appointment noted: Patient received Spravato 84 mg today.  She tolerated it well without any unusual headache, nausea or vomiting or other somatic symptoms.  Dissociation did occur and she gradually saw resolution over the 2-hour period of observation.  She feels better than last week.  She is not as depressed and down.  She is still dealing with grief around the death of her cousin that was unexpected.  It is still difficult to tell how much the Spravato was doing but she is hopeful.  Anxiety is still present with the OCD.  She is not having suicidal thoughts.  She is not hopeless.  She wants to continue treatment.  10/25/2021 appointment with the following noted: Patient received Spravato 84 mg today.  She tolerated it well without any unusual headache, nausea or vomiting or other somatic symptoms.  Dissociation did occur and she gradually saw resolution over the 2-hour period of observation.  She does not typically find the dissociation very strong. She is beginning to think the Spravato is helping somewhat with the depression.  It has been difficult to tell with the holidays intervening as well as the death of her cousin.  She has not been able to get Spravato twice weekly for 4 weeks straight as typically planned.  However she is hopeful.  The OCD remains significant.  She still has a tendency to think very negatively.  She is not suicidal.  10/28/2021 appointment with the following noted: Patient received Spravato 84 mg today.  She tolerated it well without any unusual headache, nausea or vomiting or other somatic symptoms.  Dissociation did occur and she gradually saw resolution over the 2-hour period of observation.  She does not typically find the dissociation very strong. She is feeling more hopeful about the administration of Spravato.  She is having less depression she believes.  Still not dramatically different.  She still has a tendency to have a lot of anxiety and rumination and  OCD.  She is not suicidal.  She is eager to continue the Spravato.  11/01/2021 appointment with the following noted: Patient received Spravato 84 mg today.  She tolerated it well without any unusual headache, nausea or vomiting or other somatic symptoms.  Dissociation did occur and she gradually saw resolution over the 2-hour period of observation.  She does not typically find the dissociation very strong. She is continuing to see a little bit of  improvement in depression with Spravato.  The anxiety remains but may be not as severe.  The OCD remains markedly severe chronically.  She is not suicidal.  She is encouraged by the degree of improvement with Spravato and inability to enjoy things more and not be quite as ruminative.  11/04/2021 appt noted: Patient received Spravato 84 mg today.  She tolerated it well without any unusual headache, nausea or vomiting or other somatic symptoms.  Dissociation did occur and she gradually saw resolution over the 2-hour period of observation.  She does not typically find the dissociation very strong. No SE complaints with meds. She continues to feel hopeful about the Spravato.  She has less depression.  Because of a number of factors she is uncertain of the full benefit but thinks she is somewhat less depressed.  Her anxiety and OCD remain significant but a little better.  She is tolerating the medications and does not desire medicine change.  She is not currently complaining of insomnia.   11/08/2021 appointment the following noted: Patient received Spravato 84 mg today.  She tolerated it well without any unusual headache, nausea or vomiting or other somatic symptoms.  Dissociation did occur and she gradually saw resolution over the 2-hour period of observation.  She does not typically find the dissociation very strong. No SE complaints with meds. She feels the Spravato is helping somewhat.  She would like to see a greater effect.  However she is able to enjoy things.   She is productive at home.  She would like to see a lifting of a degree of sadness that remains.  The anxiety and OCD remained largely unchanged.  She wondered about the dosing of Wellbutrin 300 mg a day and Luvox 300 mg a day and possible increases.  She has been at higher doses in the past.  She plans to start water therapy for her weakness and for her shoulder.  11/11/2021 appointment with the following noted: Patient received Spravato 84 mg today.  She tolerated it well without any unusual headache, nausea or vomiting or other somatic symptoms.  Dissociation did occur and she gradually saw resolution over the 2-hour period of observation.  She does not typically find the dissociation very strong. No SE complaints with meds. She feels the Spravato is clearly helping the depression.  She would like to see a more significant effect.  She is still having trouble thinking positive. Her energy is fair.  Concentration is good except for the problem with chronic obsessions. She has been taking Wellbutrin 300 mg in Luvox 300 mg for quite some time but has taken higher doses in the past.  We discussed that.  She would like to try higher doses in order to get a better effect if possible. We just increased the doses a couple of days ago.  No effect yet.  11/15/2021 appointment with the following noted: Patient received Spravato 84 mg today.  She tolerated it well without any unusual headache, nausea or vomiting or other somatic symptoms.  Dissociation did occur and she gradually saw resolution over the 2-hour period of observation.  She does not typically find the dissociation very strong. No SE complaints with meds. The patient is now convinced that the Spravato is helping the depression.  She would like to continue twice weekly Spravato this week if possible.  She has tolerated the increase in Wellbutrin to 450 mg daily and the increase and fluvoxamine to 400 mg daily without complications thus far.  The OCD  and anxiety feed the depression to some extent. She spends approximately 2 hours daily with checking compulsions due to obsessions about causing harm to others.  For example fearing that when she has hit a pot hole that she may have hit a person and going back to check.  Checking corners and rooms out of fear that she may have harmed someone.  Other various checking compulsions.  She is hoping the increase in fluvoxamine to 400 mg will reduce that over the weeks to come.  She is not seeing a significant difference with the addition of the Spravato though she understands that was not expected.  She is more productive at home and more motivated and able to enjoy things more fully as a result of the Spravato treatment.  She is tolerating the medication  11/18/2021 appointment with the following noted: Patient received Spravato 84 mg today.  She tolerated it well without any unusual headache, nausea or vomiting or other somatic symptoms.  Dissociation did occur and she gradually saw resolution over the 2-hour period of observation.  She does not typically find the dissociation very strong. No SE complaints with meds. She clearly believes the Spravato has been helpful for the depression.  She wonders whether to continue to treatments weekly or to cut back to 1 weekly.  She would like to continue twice weekly in hopes of getting additional improvement in the depression because it is not resolved but it is difficult to get here twice a week in terms of arranging rides. She is recently increased Wellbutrin XL to 450 mg daily and fluvoxamine to 400 mg daily but they have not had time to have an official effect.  She is tolerating that well.  She is tolerating meds overwork overall well. The OCD remains the same as noted on 11/15/2021  11/25/21 appt noted: Patient received Spravato 84 mg today.  She tolerated it well without any unusual headache, nausea or vomiting or other somatic symptoms.  Dissociation did occur and  she gradually saw resolution over the 2-hour period of observation.  She does not typically find the dissociation very strong. No SE complaints with meds. She thinks the increase in Wellbutrin and Luvox have been potentially helpful for depression and OCD respectively.  It has been too early to see the full effect.  She is sleeping and eating well.  She is functioning at home.  She still spends a lot of time that is about 2 hours a day dealing with compulsive behaviors.  12/02/21 appt noted: Patient received Spravato 84 mg today.  She tolerated it well without any unusual headache, nausea or vomiting or other somatic symptoms.  Dissociation did occur and she gradually saw resolution over the 2-hour period of observation.  She does not typically find the dissociation very strong. No SE complaints with meds. Several losses and stressors recently that affect her sense of mood. However still sees significant benefit from the Spravato for her depression.  Wants to continue it. Suspect early  some benefit from the increased Wellbutrin for depression and Luvox for OCD. Tolerating meds. No complaints about the meds. Sleeping and eating well.  No new health concerns.  12/09/21 appt noted: Patient received Spravato 84 mg today.  She tolerated it well without any unusual headache, nausea or vomiting or other somatic symptoms.  Dissociation did occur and she gradually saw resolution over the 2-hour period of observation.  She does not typically find the dissociation very strong. No SE complaints with meds. Seeing noticeable  improvement from increase fluvoxamine to 400 mg daily.  Tolerating meds without concerns over them. Depression is stable with residual sx of easy guilt and easily stressed.  OCD contributes to depression but depression is not severe with less crying spells.  Productive at home with chores.  Enjoyed recent birthday.  Sleeping good. No new concerns.  12/23/2021 appointment noted: Patient  received Spravato 84 mg today.  She tolerated it well without any unusual headache, nausea or vomiting or other somatic symptoms.  Dissociation did occur and she gradually saw resolution over the 2-hour period of observation.  She does not typically find the dissociation very strong. No SE complaints with meds. Seeing noticeable improvement from increase fluvoxamine to 400 mg daily.  Tolerating meds without concerns over them. Her depression is somewhat improved with the Spravato.  She also feels generally a little lighter.  She is more motivated.  She is less overwhelmed by guilt.  The OCD is gradually improving but is still quite time-consuming as noted before.  She is sleeping well.  No side effects  12/30/2021 appointment with the following noted: Patient received Spravato 84 mg today.  She tolerated it well without any unusual headache, nausea or vomiting or other somatic symptoms.  Dissociation did occur and she gradually saw resolution over the 2-hour period of observation.  She does not typically find the dissociation very strong. No SE complaints with meds. Seeing noticeable improvement from increase fluvoxamine to 400 mg daily.  Tolerating meds without concerns over them. She is confident of her the improvement seen with Spravato.  She is less hopeless.  Guilt is marked remarkably improved.  She is not having any thoughts of death or dying.  She is more motivated for activities such as exercise which she is recently started.  She is sleeping well. The OCD remains severe but it is improving somewhat with the increase in fluvoxamine.  It is still consuming a couple hours per day.  01/10/22 apravato 84 admin  01/24/22 appt noted: Patient received Spravato 84 mg today.  She tolerated it well without any unusual headache, nausea or vomiting or other somatic symptoms.  Dissociation did occur and she gradually saw resolution over the 2-hour period of observation.  She does not typically find the  dissociation very strong. No SE complaints with meds. Very tearful today.  Feels like she has been suppressing emotion in the Spravato caused it to be released.  Discussed some stressors.  Overall still feels the medicine is helpful.  She has missed some of the scheduled Spravato treatments that were intended to be weekly due to circumstances beyond her control.  She is still struggling with OCD as previously noted but does believe the medications are helpful. Plan no med changes  01/31/2022 received Spravato 84 mg today  02/09/2022 appointment with the following noted: Patient received Spravato 84 mg today.  She tolerated it well without any unusual headache, nausea or vomiting or other somatic symptoms.  Dissociation did occur and she gradually saw resolution over the 2-hour period of observation.  She does not typically find the dissociation very strong. No SE complaints with meds. Spravato clearly helps depression and OCD but easily gets overwhelmed and tearful with fairly routine stressors.  Tolerating meds. Sleep and appetite is OK Asks to increase lorazepam to 2 mg AM and HS and 1mg  afternoon  02/16/22 appt noted: Patient received Spravato 84 mg today.  She tolerated it well without any unusual headache, nausea or vomiting or other somatic symptoms.  Dissociation did occur and she gradually saw resolution over the 2-hour period of observation.  She does not typically find the dissociation very strong. No SE complaints with meds. She has chronic depesssion and OCD but is improved with Spravato, both dx versus before.  She has continued Luvox 400 mg and Wellbutrin 450 mg and is tolerating it.  Chronically easily stressed.  Tolerating all meds.  Doesn't like taking more meds.  Spending a couple hours daily with OCD.  No SI No med changes.  02/21/22 appt noted:   Doesn't like taking more meds.  Spending a couple hours daily with OCD.  No SI No med changes.  02/21/22 appt noted: Patient received  Spravato 84 mg today.  She tolerated it well without any unusual headache, nausea or vomiting or other somatic symptoms.  Dissociation did occur and she gradually saw resolution over the 2-hour period of observation.  She does not typically find the dissociation very strong. No SE complaints with meds. She has chronic depesssion and OCD but is improved with Spravato, both dx versus before.  She has continued Luvox 400 mg and Wellbutrin 450 mg and is tolerating it.  Chronically easily overwhelmed and doesn't know why.  Tolerating all meds. Wants to continue meds.  03/16/22 appt noted: Patient received Spravato 84 mg today.  She tolerated it well without any unusual headache, nausea or vomiting or other somatic symptoms.  Dissociation did occur and she gradually saw resolution over the 2-hour period of observation.  She does not typically find the dissociation very strong. No SE complaints with meds. Overall she still feels the Spravato has been helpful not only for her depression but also for her OCD which was somewhat unexpected.  OCD is still significant but it is less severe than prior to starting Spravato.  She is tolerating Luvox 400 mg and Wellbutrin 450 mg.  We discussed possible med adjustments.  03/23/22 appt noted: Patient received Spravato 84 mg today.  She tolerated it well without any unusual headache, nausea or vomiting or other somatic symptoms.  Dissociation did occur and she gradually saw resolution over the 2-hour period of observation.  She does not typically find the dissociation very strong. No SE complaints with meds. She is still depressed and still has OCD of course but is improved with the Spravato.  She is tolerating the medications well.  We had previously discussed the possibility of switching some of the Wellbutrin to [facility] and she is very interested in that in hopes of further improvement in depression and OCD.  She understands that Auvelity is not used for OCD on the label.   She is tolerating the medications.  She is still easily overwhelmed.  She is sleeping and eating okay.. Plan: Reduce Wellbutrin XL to 300 mg AM and add Auvelity 1 tablet each AM  03/30/22 appt noted: Patient received Spravato 84 mg today.  She tolerated it well without any unusual headache, nausea or vomiting or other somatic symptoms.  Dissociation did occur and she gradually saw resolution over the 2-hour period of observation.  She does not typically find the dissociation very strong. No SE complaints with meds. She is still depressed and still has OCD of course but is improved with the Spravato.  She is tolerating the medications well.  No difference with Auvelity 1 AM so far and no SE.  Going on vacation on Saturday. 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 Sleep and appetite good. Plan: Increase Auvelity to 1 twice daily and  reduce Wellbutrin to XL 150 every morning  04/14/2022 appointment with the following noted: Patient received Spravato 84 mg today.  She tolerated it well without any unusual headache, nausea or vomiting or other somatic symptoms.  Dissociation did occur and she gradually saw resolution over the 2-hour period of observation.  She does not typically find the dissociation very strong. No SE complaints with meds. She is still depressed and still has OCD of course but is improved with the Spravato.  She is tolerating the medications well.  Just increased Auvelity to BID yesterday and reduced Wellbutrin to 150 AM. No SE so far.  No change in mood or anxiety so far.  Chronic OCD as noted and residual depression and chronic fatigue.  04/21/2022 appointment with the following noted: Patient received Spravato 84 mg today.  She tolerated it well without any unusual headache, nausea or vomiting or other somatic symptoms.  Dissociation did occur and she gradually saw resolution over the 2-hour period of observation.  She does not typically find the dissociation very strong. No  SE complaints with meds. She is still depressed and still has OCD of course but is improved with the Spravato.   She has questions about the dosing of lorazepam. She tends to have negative anxious thoughts at night.  This tends to interfere with her ability to go to sleep.  She is getting about 8 to 9 hours of sleep.  She is tolerating the meds without excessive sedation and does not nap during the day.  05/06/22 appt noted: Patient received Spravato 84 mg today.  She tolerated it well without any unusual headache, nausea or vomiting or other somatic symptoms.  Dissociation did occur and she gradually saw resolution over the 2-hour period of observation.  She does not typically find the dissociation very strong. No SE complaints with meds. She is still depressed and still has OCD of course but is improved with the Spravato.   Had some questions about timing of dosing of fluvoxamine and Auvelity. OCD is not quite as time consuming.  Sleep and eating are the same.   No SE meds.  05/25/22 appt noted: Patient received Spravato 84 mg today.  She tolerated it well without any unusual headache, nausea or vomiting or other somatic symptoms.  Dissociation did occur and she gradually saw resolution over the 2-hour period of observation.  She does not typically find the dissociation very strong. No SE complaints with meds. She is still depressed and still has OCD of course but is improved with the Spravato.   She has less OCD when away from home and on vacation of note. Plan: Rec gradually reduce HS lorazepam to 1 mg Hs.  Can continue lorazepam 2 mg AM and 1 mg in afternoon bc of chronic anxiety and it is helpful and tolerated. She can continue temazepam 30 mg nightly.  She tends to have a lot of anxious negative thoughts at night when she is trying to go to bed  06/16/22 appt noted: Patient received Spravato 84 mg today.  She tolerated it well without any unusual headache, nausea or vomiting or other somatic  symptoms.  Dissociation did occur and she gradually saw resolution over the 2-hour period of observation.  She does not typically find the dissociation very strong. No SE complaints with meds. She is still depressed and still has OCD of course but is improved with the Spravato.   She has less OCD when away from home and on vacation of note. She  is tolerating the medications.  She has continued current medications. 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  She still receives benefit from [place].  Her depression and OCD are better with the Spravato.  She thinks she is getting additional benefit with the switch from Wellbutrin to [place].  07/04/2022 appointment noted: Reports she developed a rash on her face from [facility] and feels like she is allergic to it.  She stopped it and went back to Wellbutrin 450 mg every morning.  The rash has cleared up.  She did not require any medical attention and did not have shortness of breath. Overall her depression and OCD are about the same as they have been.  She did not notice a substantial difference from the brief treatment with Auvelity but she understands she did not take a full course.  She is tolerating the current medicines well. Current meds fluvoxamine 400 mg daily, Wellbutrin XL 450 mg daily, lorazepam 1 to 2 mg in the morning and 1 to 2 mg at night and 1 mg in the afternoon, temazepam 30 mg nightly. She wants to continue the Spravato because she feels it has been helpful for both her depression and her racing OCD  07/18/22 appt noted: Patient received Spravato 84 mg today.  She tolerated it well without any unusual headache, nausea or vomiting or other somatic  symptoms.  Dissociation did occur and she gradually saw resolution over the 2-hour period of observation.  She does not typically find the dissociation very strong. No SE complaints with meds. She is still depressed and still has OCD of course but is improved with the Spravato.  Rash better off Auvelity and back on Welllbutrin XL 450 mg AM, fluvoxamine 400 mg daily.  08/15/22 appt noted: Current psych meds: Wellbutrin XL 450 mg AM, fluvoxamine 100 mg in AM and 300 mg HS, lorazepam 1 mg 1-2 mg in the AM and HS and 1 tablet prn midday for anxiety, temazepam 30 mg HS Patient received Spravato 84 mg today.  She tolerated it well without any unusual headache, nausea or vomiting or other somatic symptoms.  Dissociation did occur and she gradually saw resolution over the 2-hour period of observation.  She does not typically find the dissociation very strong. No SE complaints with meds. She has a great deal of stress dealing with her family.  Disc brother's ongoing mania and difficulty getting him help and the stress he causes for the family. She wants to continue Spravato through this very stressful holdicay season and reevaluate the frequency after the New Year.  09/12/22 appt noted: Current psych meds: Wellbutrin XL 450 mg AM, fluvoxamine 100 mg in AM and 300 mg HS, lorazepam 1 mg 1-2 mg in the AM and HS and 1 tablet prn midday for anxiety, temazepam 30 mg HS Patient received Spravato 84 mg today.  She tolerated it well without any unusual headache, nausea or vomiting or other somatic symptoms.  Dissociation did occur and she gradually saw resolution over the 2-hour period of observation.  She does not typically find the dissociation very strong. No SE complaints with meds. She has a great deal of stress dealing with her family.  Disc brother's ongoing mania and difficulty getting him help and the stress he causes for the family. She wants to continue Spravato through this very stressful holdicay season  and reevaluate the frequency after the New Year.  Chronically easily overwhelmed with family. Complaining of HA and history migraine.  Asks for increase imitrex and disc preventatives like propranolol ER  09/26/22 appt noted: Current psych meds: Wellbutrin XL 450 mg AM, fluvoxamine 100 mg in AM and 300 mg HS, lorazepam 1 mg 1-2 mg in the AM and HS and 1 tablet prn midday for anxiety, temazepam 30 mg HS Patient received Spravato 84 mg today.  She tolerated it well without any unusual headache, nausea or vomiting or other somatic symptoms.  Dissociation did occur and she gradually saw resolution over the 2-hour period of observation.  She does not typically find the dissociation very strong. No SE complaints with meds. She has a great deal of stress dealing with her family.  This is ongoing The holidays are much more stressful DT family problems.  She is noting OCD is much worse over the last couple of week.  Depression is better with Spravato. Needed higher dose meds for migraine.   10/03/22 appt noted: Current psych meds: Wellbutrin XL 450 mg AM, fluvoxamine 100 mg in AM and 300 mg HS, lorazepam 1 mg 1-2 mg in the AM and HS and 1 tablet prn midday for anxiety, temazepam 30 mg HS Patient received Spravato 84 mg today.  She tolerated it well without any unusual headache, nausea or vomiting or other somatic symptoms.  Dissociation did occur and she gradually saw resolution over the 2-hour period of observation.  She does not typically find the dissociation very strong. No SE complaints with meds. She has now realized that the rash she previously previously attributed to [facility] was not related.  She is interested may be retrying that after the holidays.  She is tolerating medications otherwise. The holidays remain chronically stressful to her due to family dynamic problems which cause her to consistently feel stuck.  Under more stress her OCD is worse.  She will have a tendency to have crying spells.   The depression and OCD are still improved with Spravato as compared to before.  11/15/22 appt noted: Current psych meds: Wellbutrin XL 450 mg AM, fluvoxamine 100 mg in AM and 300 mg HS, lorazepam 1 mg 1-2 mg in the AM and HS and 1 tablet prn midday for anxiety, temazepam 30 mg HS Patient received Spravato 84 mg today.  She tolerated it well without any unusual headache, nausea or vomiting or other somatic symptoms.  Dissociation did occur and she gradually saw resolution over the 2-hour period of observation.  She does not typically find the dissociation very strong. No SE complaints with meds. Continues to feel depressed and overwhelmed by family problems including her brother's mania and recent eviction and commitment.  Chronic OCD worse when stressed.  No SI.  Tolerating meds. Plan: Per her request continue Wellbutrin XL 450 mg every morning. She has come to the realization that the rash she had previously attributed to [facility] was not related.  She is interested in perhaps retrying Auvelity. There are few alternative medication options that remain.  01/11/23 appt noted: Current psych meds: Wellbutrin XL 150 mg BID and started Auvelity 1 AM, fluvoxamine 100 mg in AM and 300 mg HS, lorazepam 1 mg 1-2 mg in the AM and HS and 1 tablet prn midday for anxiety, temazepam 30 mg HS  Patient received Spravato 84 mg today.  She tolerated it well without any unusual headache, nausea or vomiting or other somatic symptoms.  Dissociation did occur and she gradually saw resolution over the 2-hour period of observation.  She does not typically find the dissociation very strong. No SE complaints with meds. Trouble getting to sessions lately DT transportation problems.   Continues to feel depressed and overwhelmed by OCD and family.  Feels she needs toevery other week Spravato bc it helps for a couple of weeks and then seems to wear off.  Struggleing with OCD and depression both of which are eased by Spravato. Less  crying with Auvelity.  02/07/23 appt noted: Current psych meds: Wellbutrin XL 150 mg BID and started Auvelity 1 AM, fluvoxamine 100 mg in AM and 300 mg HS, lorazepam 1 mg 1-2 mg in the AM and HS and 1 tablet prn midday for anxiety, temazepam 30 mg HS Patient received Spravato 84 mg today.  She tolerated it well without any unusual headache, nausea or vomiting or other somatic symptoms.  Dissociation did occur and she gradually saw resolution over the 2-hour period of observation.  She does not typically find the dissociation very strong. No SE complaints with meds. Trouble getting to sessions lately DT transportation problems.  This is a problem ongoing and thinks she might need to pause Spravato bc won't be able to get her for at least 3 weeks. She is holding pretty steady with a moderate level of anxiety and depression ongoing and chronic.    03/20/23 appt noted: Current psych meds: Wellbutrin XL 150 mg BID and started Auvelity 1 AM, fluvoxamine 100 mg in AM and 300 mg HS, lorazepam 1 mg 1-2 mg in the AM and HS and 1 tablet prn midday for anxiety, temazepam 30 mg HS Patient received Spravato 84 mg today.  She tolerated it well without any unusual headache, nausea or vomiting or other somatic symptoms.  Dissociation did occur and she gradually saw resolution over the 2-hour period of observation.  She does not typically find the dissociation very strong. No SE complaints with meds. She is trying to get back into more regular Spravato administration.  Family issues and transportation problems that led to her missing Spravato.  She feels more depressed without regular Spravato.  Her OCD is chronic but also worse when she misses Spravato.  She does not want any medication changes.  04/03/23 appt noted: Current psych meds: Wellbutrin XL 150 mg BID and started Auvelity 1 AM, fluvoxamine 100 mg in AM and 300 mg HS, lorazepam 1 mg 1-2 mg in the AM and HS and 1 tablet prn midday for anxiety, temazepam 30 mg  HS Patient received Spravato 84 mg today.  She tolerated it well without any unusual headache, nausea or vomiting or other somatic symptoms.  Dissociation did occur and she gradually saw resolution over the 2-hour period of observation.  She does not typically find the dissociation very strong.  It gets rid of negative emotion for awhile after procedure and would like it to last longer.   No SE complaints with meds.  Doesn't really want med changes.   Planning to weekly Spravato.  It is helping dep and anxiety.    04/10/23 appt noted: Current psych meds: Wellbutrin XL 150 mg BID and started Auvelity 1 AM, fluvoxamine 100 mg in AM and 300 mg HS, lorazepam 1 mg 1-2 mg in the AM and HS and 1 tablet prn midday for anxiety, temazepam 30 mg  HS Patient received Spravato 84 mg today.  She tolerated it well without any unusual headache, nausea or vomiting or other somatic symptoms.  Dissociation did occur and she gradually saw resolution over the 2-hour period of observation.  She does not typically find the dissociation very strong.  It gets rid of negative emotion for awhile after procedure and would like it to last longer.   No SE complaints with meds.  Doesn't really want med changes.   Had lidocaine shot for back pain with brief benefit.  Doing some water based PT Spravato went well today. Got pretty upset last week with event related to son's activities.  Got upset with son saying something inappropriate publicly.  Was so embarrassed.  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    He's kind of rebellious lately.  Son is 10 yo.    05/03/23 appt noted: Current psych meds: Wellbutrin XL 150 mg BID and started Auvelity 1 AM, fluvoxamine 100 mg in AM and 300 mg HS, lorazepam 1 mg 1-2 mg in the AM and HS and 1 tablet prn midday for anxiety, temazepam 30 mg HS No SE Patient received Spravato 84 mg today.  She tolerated it well without any unusual headache, nausea or  vomiting or other somatic symptoms.  Dissociation did occur and she gradually saw resolution over the 2-hour period of observation.  She does not typically find the dissociation very strong.  It gets rid of negative emotion for awhile after procedure and would like it to last longer.   Still pleased with benefit from [facility] for mood and anxiety. No SE complaints with meds.  Doesn't really want med changes.   Chronic family px ongoing affects her but nothing she can change.H sick of the family drama.   Continuing counseling helps.  Has some support. Mood and anxiety pretty steady but did go on vacation to [place].  Anxiety worse first in AM and then later at night with mind racing on stressors.   Still back trouble.  05/23/23 appt noted: Current psych meds: Wellbutrin XL 150 mg BID and started Auvelity 1 AM, fluvoxamine 100 mg in AM and 300 mg HS, lorazepam 1 mg 1-2 mg in the AM and HS and 1 tablet prn midday for anxiety, temazepam 30 mg HS No SE Patient received Spravato 84 mg today.  She tolerated it well without any unusual headache, nausea or vomiting or other somatic symptoms.  Dissociation did occur and she gradually saw resolution over the 2-hour period of observation.  She does not typically find the dissociation very strong.  It gets rid of negative emotion for awhile after procedure and would like it to last longer.   Still pleased with benefit from [facility] for mood and anxiety by 50%. No SE complaints with meds. Chronic family stress interferes with mental health and self care.  May have to reduce frequency of Spravato bc of this. But is status quo with meds.  Chronic residual OCD which is mod severe and dep moderate.  05/30/23 appt noted: Current psych meds: Wellbutrin XL 150 mg BID and started Auvelity 1 AM, fluvoxamine 100 mg in AM and 300 mg HS, lorazepam 1 mg 1-2 mg in the AM and HS and 1 tablet prn midday for anxiety, temazepam 30 mg HS No SE Patient received Spravato 84 mg  today.  She tolerated it well without any unusual headache, nausea or vomiting or other somatic symptoms.  Dissociation did occur and she gradually saw resolution over the 2-hour period of observation.  She does not typically find the dissociation very strong.  It gets rid of negative emotion for awhile after procedure and would like it to last longer.   Still pleased with benefit from [facility] for mood and anxiety by 50% or better but not resolved. She wants to continue Spravato as frequently as schedule will allow. Both depression and OCD are better with most improvement in mood.  Asks about any new treatments for OCD. No SE complaints with meds. Chronic family stress interferes with mental health and self care.  This is an ongoing drain on her mood and resources emotionally.    06/06/23 appt noted: Current psych meds: Wellbutrin XL 150 mg BID and started Auvelity 1 AM, fluvoxamine 100 mg in AM and 300 mg HS, lorazepam 1 mg 1-2 mg in the AM and HS and 1 tablet prn midday for anxiety, temazepam 30 mg HS No SE Patient received Spravato 84 mg today.  She tolerated it well without any unusual headache, nausea or vomiting or other somatic symptoms.  Dissociation did occur and she gradually saw resolution over the 2-hour period of observation.  She does not typically find the dissociation very strong.  It gets rid of negative emotion for awhile after procedure and would like it to last longer.   Still believes each meds work and are helpful and well tolerated.  Specifically she thinks that the Auvelity adds additional benefit on taking Wellbutrin.  She believes the meds help both depression and OCD though the OCD remains a problem.  She also believes Spravato helps both types of symptoms as well.  Her mood is variable.  She experiences a great deal of stress from her family and those problems wax and wane.  She has bad days because of it and today is 1 of those days.  She is continuing counseling and that is  helpful.  Due to family demands she may have to spread out the Spravato frequency.  06/16/23 appt noted:  Current psych meds: Wellbutrin XL 150 mg BID and started Auvelity 1 AM, fluvoxamine 100 mg in AM and 300 mg HS, lorazepam 1 mg 1-2 mg in the AM and HS and 1 tablet prn midday for anxiety, temazepam 30 mg HS No SE Patient received Spravato 84 mg today.  She tolerated it well without any unusual headache, nausea or vomiting or other somatic symptoms.  Dissociation did occur and she gradually saw resolution over the 2-hour period of observation.  She does not typically find the dissociation very strong.  It gets rid of negative emotion for awhile after procedure . She is trying to make the Schedule work for [employer] oh every week or every other week because she is aware the treatment effects can be lost if it is time less frequently.  So she has been able to come the last 2 weeks, we will try to come in 2 weeks.  Spravato reduces depression and OCD significantly though she remains highly symptomatic with both.  However she has no suicidal thoughts.  Crying spells are reduced with Spravato.  She still spends a fair amount of time meaning over an hour a day checking and more under stress or when tired.  She asks about any new meds for OCD. Plan: increase Auvelity 1 in AM & PM Hold Wellbutrin XL 150 mg BID  07/12/23 appt noted: Current psych meds: Wellbutrin XL 150 mg BID and started [employer]  1 AM, fluvoxamine 100 mg in AM and 300 mg HS, lorazepam 1 mg 1-2 mg in the AM and HS and 1 tablet prn midday for anxiety, temazepam 30 mg HS.  Didn't increase Auvelity yet No SE Patient received Spravato 84 mg today.  She tolerated it well without any unusual headache, nausea or vomiting or other somatic symptoms.  Dissociation did occur and she gradually saw resolution over the 2-hour period of observation.  She does not typically find the dissociation very strong.  It gets rid of negative emotion for awhile after  procedure . She has not increased the Auvelity yet is planned.  She has a little bit of nervousness about it but admits is not rational.  She is willing to give that a try to try to get better control of depression and anxiety.  She continues to see the benefit of Spravato every other week.  She is struggling with some pain due to plantar fasciitis and given her cerebral palsy that makes it even more difficult to walk and to engage in normal activities.  She is willing to seek treatment for that.  07/25/23 appt noted: Current psych meds: Wellbutrin XL 150 mg AM and started Auvelity 1 BID, fluvoxamine 100 mg in AM and 300 mg HS, lorazepam 1 mg 1-2 mg in the AM and HS and 1 tablet prn midday for anxiety, temazepam 30 mg HS.  Didn't increase Auvelity yet No SE Patient received Spravato 84 mg today.  She tolerated it well without any unusual headache, nausea or vomiting or other somatic symptoms.  Dissociation did occur and she gradually saw resolution over the 2-hour period of observation.  She does not typically find the dissociation very strong.  It gets rid of negative emotion for awhile after procedure . Chronic dep and anxiety to some extent.  But has increased Auvelity to BID recently and no SE yet.  Disc frequency Spravato bc benefit and will try to continue every other week and not less if possible. No further concerns for meds.  08/08/23 appt noted: Current psych meds: Wellbutrin XL 150 mg AM and Auvelity 1 BID, fluvoxamine 100 mg in AM and 300 mg HS, lorazepam 1 mg 1-2 mg in the AM and HS and 1 tablet prn midday for anxiety, temazepam 30 mg HS.  Didn't increase Auvelity yet No SE with med changes. Patient received Spravato 84 mg today.  She tolerated it well without any unusual headache, nausea or vomiting or other somatic symptoms.  Dissociation did occur and she gradually saw resolution over the 2-hour period of observation.  She does not typically find the dissociation very strong.  It gets rid  of negative emotion for awhile after procedure . Mornings still worse with dep and anxiety.  But mood and anxiety are better with the increase in Auvelity to BID.  OCD seems a little better.  Chronic difficulty handling the complaints and stress from her family.  Easily stresser her out.  No med changes desire. She will have to take a break from [facility] DT pending foot surgery.  Mobility and transportation will be limited.  08/25/23 appt noted: Current psych meds: Wellbutrin XL 150 mg AM and Auvelity 1 BID, fluvoxamine 100 mg in AM and 300 mg HS, lorazepam 1 mg 1-2 mg in the AM and HS and 1 tablet prn midday for anxiety, temazepam 30 mg HS.  Didn't increase Auvelity yet No SE with med changes. Patient received Spravato 84 mg today.  She tolerated it well  without any unusual headache, nausea or vomiting or other somatic symptoms.  Dissociation did occur and she gradually saw resolution over the 2-hour period of observation.  She does not typically find the dissociation very strong.  It gets rid of negative emotion for awhile after procedure . Pending foot surgery next week.  Able to work in [employer] this week which helps with dep and anxiety and OCD.  Struggles with sleep without multiple meds as noted.  No adverse effects.  Will return to Spravato asap after foot surgery  10/04/23 appt noted: Current psych meds: Wellbutrin XL 150 mg AM and Auvelity 1 BID, fluvoxamine 100 mg in AM and 300 mg HS, lorazepam 1 mg 1-2 mg in the AM and HS and 1 tablet prn midday for anxiety, temazepam 30 mg HS.  Didn't increase Auvelity yet No SE with med changes. Patient received Spravato 84 mg today.  She tolerated it well without any unusual headache, nausea or vomiting or other somatic symptoms.  Dissociation did occur and she gradually saw resolution over the 2-hour period of observation.  She does not typically find the dissociation very strong.  It gets rid of negative emotion for awhile after procedure . Mood and  anxiety stable.  Benefit with meds and Spravato.   She reports OCD time consumption typically is much better than in the past when it was routinely 2 hours daily.  Now less than have of that.    Chronic caregiver stress with dysfunctional family and has trouble with boundaries.  Working on that in therapy.   Previous psych med trials include Prozac, paroxetine, sertraline, fluvoxamine, venlafaxine, Anafranil with no response,  Wellbutrin, , Viibryd, Trintellix 10 1 month NR Auvelity BID  Geodon,  risperidone, Rexulti, Abilify,  Seroquel, Latuda 40 mg with irritability.  lamotrigine lithium,  BuSpar, Namenda,  pramipexole with no response, and Topamax, pindolol  ECT-MADRS    Flowsheet Row Office Visit from 06/29/2021 in [facility]  MADRS Total Score 36      Flowsheet Row Admission (Discharged) from 06/11/2021 in [facility]  C-SSRS RISK CATEGORY No Risk        Review of Systems:  Review of Systems  Constitutional:  Positive for fatigue.  Cardiovascular:  Negative for chest pain and palpitations.  Musculoskeletal:  Positive for arthralgias, back pain, gait problem, myalgias and neck pain.  Neurological:  Positive for weakness and headaches.  Psychiatric/Behavioral:  Positive for dysphoric mood and sleep disturbance. Negative for suicidal ideas. The patient is nervous/anxious.     Medications: I have reviewed the patient's current medications.  Current Outpatient Medications  Medication Sig Dispense Refill   Abaloparatide (TYMLOS) 3120 MCG/1.56ML SOPN Inject into the skin.     AUVELITY 45-105 MG TBCR TAKE 1 TABLET BY MOUTH IN THE MORNING AND AT BEDTIME 60 tablet 1   Azelastine-Fluticasone 137-50 MCG/ACT SUSP Place 1-2 sprays into both nostrils daily.     baclofen (LIORESAL) 10 MG tablet Take 20 mg by mouth at bedtime as needed for muscle spasms.     buPROPion (WELLBUTRIN XL) 150 MG 24 hr tablet Take 150 mg by mouth daily.      Dextromethorphan-buPROPion ER (AUVELITY) 45-105 MG TBCR Take 1 tablet by mouth in the morning and at bedtime. 60 tablet 0   dicyclomine (BENTYL) 10 MG capsule Take 10 mg by mouth daily.     docusate sodium (COLACE) 100 MG capsule Take 1 capsule (100 mg total) by mouth 2 (two) times daily. (Patient  taking differently: Take 100 mg by mouth daily.) 10 capsule 0   Esketamine HCl, 84 MG Dose, (SPRAVATO, 84 MG DOSE,) 28 MG/DEVICE SOPK USE 3 SPRAYS IN EACH NOSTRIL ONCE A WEEK 3 each 0   fexofenadine (ALLEGRA) 180 MG tablet Take 180 mg by mouth daily.     fluvoxaMINE (LUVOX) 100 MG tablet TAKE 1 TABLET IN THE AM AND 3 TABLETS AT NIGHT 360 tablet 0   hydrocortisone (ANUSOL-HC) 2.5 % rectal cream Place rectally 2 (two) times daily. x 7-14 days 30 g 0   ketotifen (ZADITOR) 0.025 % ophthalmic solution Place 3 drops into both eyes at bedtime.     LORazepam (ATIVAN) 1 MG tablet TAKE 1-2 IN THE AM AND 1-2 TABLETS EVERY NIGHT AT BEDTIME AND 1 TABLET IN AFTERNOON when needed for anxiety and sleep 150 tablet 0   magnesium gluconate (MAGONATE) 500 MG tablet Take 500 mg by mouth daily.     MIBELAS 24 FE 1-20 MG-MCG(24) CHEW Chew 1 tablet by mouth at bedtime as needed (bowel regularity).     Multiple Vitamins-Minerals (ADULT GUMMY PO) Take 2 tablets by mouth in the morning.     nitrofurantoin (MACRODANTIN) 100 MG capsule Take 100 mg by mouth as needed (For urinary tract infection.).      oxyCODONE-acetaminophen (PERCOCET/ROXICET) 5-325 MG tablet Take 1-2 tablets by mouth every 6 (six) hours as needed for severe pain. 50 tablet 0   polyethylene glycol (MIRALAX / GLYCOLAX) packet Take 17 g by mouth daily as needed for mild constipation. 14 each 0   propranolol ER (INDERAL LA) 60 MG 24 hr capsule TAKE 1 CAPSULE BY MOUTH EVERY DAY 30 capsule 0   psyllium (METAMUCIL) 58.6 % powder Take 1 packet by mouth daily as needed (constipation).     SUMAtriptan (IMITREX) 100 MG tablet Take 1 tablet (100 mg total) by mouth every 2 (two)  hours as needed for migraine. May repeat in 2 hours if headache persists or recurs. 9 tablet 2   temazepam (RESTORIL) 30 MG capsule Take 1 capsule by mouth at bedtime as needed for sleep 30 capsule 0   Vitamin D-Vitamin K (VITAMIN K2-VITAMIN D3 PO) Take 1-2 sprays by mouth daily.     No current facility-administered medications for this visit.    Medication Side Effects: None   Allergies:  Allergies  Allergen Reactions   Hydrocodone Itching   Sulfamethoxazole-Trimethoprim Itching   Dust Mite Extract Other (See Comments)    Sneezing, watery eyes, runny nose   Latex Itching   Other Other (See Comments)    PT IS ALLERGIC TO CAT DANDER AND RAGWEED - Sneezing, watery eyes, runny nose    Pollen Extract Other (See Comments)    Sneezing, watery eyes, runny nose     Past Medical History:  Diagnosis Date   Abnormal Pap smear 2011   hpv/mild dysplasia,cin1   Anxiety    Cerebral palsy (HCC)    right arm/leg   Cystocele    Depression    Headache    Neuromuscular disorder (HCC)    Cerebral Palsy   OCD (obsessive compulsive disorder)    Osteoporosis    Uterine prolaps     Family History  Problem Relation Age of Onset   Cancer Father        skin AND LUNG   Alcohol abuse Sister        CRACK COCAINE    Social History   Socioeconomic History   Marital status: Married    Spouse name:  Not on file   Number of children: Not on file   Years of education: Not on file   Highest education level: Not on file  Occupational History   Not on file  Tobacco Use   Smoking status: Never   Smokeless tobacco: Never  Substance and Sexual Activity   Alcohol use: Not Currently    Comment: OCCASIONAL beer   Drug use: No   Sexual activity: Yes    Birth control/protection: Pill    Comment: LOESTRIN 24 FE  Other Topics Concern   Not on file  Social History Narrative   Not on file   Social Drivers of Health   Financial Resource Strain: Not on file  Food Insecurity: Not on file   Transportation Needs: Not on file  Physical Activity: Not on file  Stress: Not on file  Social Connections: Not on file  Intimate Partner Violence: Not on file    Past Medical History, Surgical history, Social history, and Family history were reviewed and updated as appropriate.   Please see review of systems for further details on the patient's review from today.   Objective:   Physical Exam:  LMP  (LMP Unknown)   Physical Exam Constitutional:      General: She is not in acute distress. Neurological:     Mental Status: She is alert and oriented to person, place, and time.     Cranial Nerves: No dysarthria.     Motor: Weakness present.     Gait: Gait abnormal.  Psychiatric:        Attention and Perception: Attention and perception normal.        Mood and Affect: Mood is anxious and depressed. Affect is not tearful.        Speech: Speech normal. Speech is not slurred.        Behavior: Behavior normal. Behavior is cooperative.        Thought Content: Thought content normal. Thought content is not delusional. Thought content does not include homicidal or suicidal ideation. Thought content does not include suicidal plan.        Cognition and Memory: Cognition and memory normal. Cognition is not impaired.        Judgment: Judgment normal.     Comments: Insight intact Ongoing OCD remains fairly severe but less anxious Checking compulsions up to 2 hours daily but improved noticeably with Spravato Chronic depression persistent but better with Spravato  and helps OCD too        Lab Review:     Component Value Date/Time   NA 138 06/11/2021 0606   K 4.0 06/11/2021 0606   CL 107 06/11/2021 0606   CO2 26 06/11/2021 0606   GLUCOSE 90 06/11/2021 0606   BUN 18 06/11/2021 0606   CREATININE 0.81 06/11/2021 0606   CALCIUM 9.4 06/11/2021 0606   PROT 6.5 06/11/2021 0606   ALBUMIN 3.3 (L) 06/11/2021 0606   AST 17 06/11/2021 0606   ALT 14 06/11/2021 0606   ALKPHOS 141 (H)  06/11/2021 0606   BILITOT 0.2 (L) 06/11/2021 0606   GFRNONAA >60 06/11/2021 0606   GFRAA >60 07/09/2016 0438       Component Value Date/Time   WBC 5.8 06/11/2021 0606   RBC 4.12 06/11/2021 0606   HGB 12.5 06/11/2021 0606   HCT 39.7 06/11/2021 0606   PLT 299 06/11/2021 0606   MCV 96.4 06/11/2021 0606   MCH 30.3 06/11/2021 0606   MCHC 31.5 06/11/2021 0606   RDW 13.9 06/11/2021  0606   LYMPHSABS 1.9 06/11/2021 0606   MONOABS 0.5 06/11/2021 0606   EOSABS 0.1 06/11/2021 0606   BASOSABS 0.0 06/11/2021 0606    No results found for: "POCLITH", "LITHIUM"   No results found for: "PHENYTOIN", "PHENOBARB", "VALPROATE", "CBMZ"   .res Assessment: Plan:    Casey "[name]" was seen today for follow-up, depression, anxiety, fatigue and sleeping problem.  Diagnoses and all orders for this visit:  Recurrent major depression resistant to treatment [facility])  Mixed obsessional thoughts and acts  Social anxiety disorder  Insomnia due to mental condition  Migraine without aura and without status migrainosus, not intractable  Caregiver stress    Both primary Dx of OCD and major depression are TR and marked.  Impaired function but less so with Spravato re: depression..   She is receiving Spravato 84 mg weekly and moderate improvement in the depression..  she feels it also helps OCD somewhat.  However still easily overwhelmed with low stress tolerance.  Family contributes to her anxiety and stress markedly.  The OCD is improved with the increase in fluvoxamine and with Spravato.  Spends up to 2 hours daily and checking compulsions on her worst days but better when she travels.  No new options for tx are evident.  She has been on higher doses of fluvoxamine above the usual max of 400 mg daily in the past and finds it more helpful at the higher dose.    Disc SE.   She is tolerating the meds well  Continue  Luvox back to 400 mg nightly as of January 2023. Disc dosing higher than usual.  She feels  this is increase has helped more with OCD which remains chronically severe.  Continue Auvelity BID and reduced Wellbutrin to 1 AM. It has helped and is tolerated.   We have discussed seizure risk that is possible using this combination but given severity of her symptoms she feels the risk is warranted. There are few other alternative medication options that remain.   Disc DDI issues.   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 She doesn't want to try this now.  Disc Spravato DT TRD incl details and SE. Disc dosing and duration.  Pt with severe depression MADRS 36 on 06/29/21  Patient was administered Spravato 84 mg intranasally dosage today.  The patient experienced the typical dissociation which gradually resolved over the 2-hour period of observation.  There were no complications.  Specifically the patient did not have nausea or vomiting or headache.  Blood pressures remained within normal ranges at the 40-minute and 2-hour follow-up intervals.  By the time the 2-hour observation period was met the patient was alert and oriented and able to exit without assistance. She tends to have lingering sedative effects but not severe. .  See nursing note for further details. Try to continue every other week Spravato bc clear benefit.  Didn't feel as well when off spravato but disc family issues interfering with frequency.  We discussed the short-term risks associated with benzodiazepines including sedation and increased fall risk among others.  Discussed long-term side effect risk including dependence, potential withdrawal symptoms, and the potential eventual dose-related risk of dementia.  But recent studies from 2020 dispute this association between benzodiazepines and dementia risk. Newer studies in 2020 do not support an association with dementia. Disc this is high dose and not ideal.  Also disc risk combining it with temazepam. Rec try gradually reduce HS lorazepam to 1 mg Hs.  Can continue lorazepam 2 mg AM and 1 mg in afternoon bc of chronic anxiety and it is helpful and tolerated. She can continue temazepam 30 mg nightly.  She tends to have a lot of anxious negative thoughts at night when she is trying to go to bed.  She is trying to reduce the dose.  Complaining of HA and history migraine.  Asks for increase imitrex and disc preventatives like propranolol ER imitrex to 100 mg prn migraine and propranolol ER for migraine prevention.  Supportive therapy dealing with some of the recent stressors including son's autism and recent issues of rebelliousness with him.    No med changes continue Auvelity 1 in AM & PM Reduced  Wellbutrin XL 150 mg AM Fluvoxamine 100 mg AM and 300 mg PM above usual max bc med necessary Lorazepam 2 mg HS and prn 1 mg BID prn anxiety daily Temazepam 30 mg HS Propranolol ER 60 mg daily Imitrex prn migraine  Follow-up every week if possible and if note every other week and try to be consistent. That is her plan but family demands do interfere at times.     Meredith Staggers, MD, DFAPA  Please see After Visit Summary for patient specific instructions.  Future Appointments  Date Time Provider Department Center  10/05/2023 11:00 AM Robley Fries, PhD [facility] None  10/24/2023 11:00 AM Cottle, Steva Ready., MD [facility] None  11/17/2023 11:00 AM Robley Fries, PhD [facility] None  12/07/2023  1:00 PM Robley Fries, PhD [facility] None  12/12/2023 11:00 AM Robley Fries, PhD [facility] None  12/22/2023  4:00 PM Robley Fries, PhD CP[facility] None  12/28/2023  1:00 PM Robley Fries, PhD [facility] None  01/11/2024  1:00 PM Robley Fries, PhD CP[facility] None  01/25/2024  1:00 PM Robley Fries, PhD CP[facility] None    No orders of the defined types were placed in this encounter.    -------------------------------

## 2023-10-04 NOTE — Progress Notes (Signed)
URSE NOTE:   Pt arrived for her Spravato Treatment, she started Spravato treatments on 10/07/2021, she continues with 84 mg (3 of the 28 mg) Spravato nasal spray for treatment resistance depression. Pt is being treated for Treatment Resistant Depression, Pt taken to treatment room. Pt's medication is charged through [employer].  Medication is stored behind 2 locked doors, it is never given to the pt until time of administration which is observed by the nurse. Disposed of per FDA/REMS regulations. All Spravato Treatments are documented in Spravato REMS per protocol of being a treatment center. Spravato is a CIII medication and has to be only given at a treatment facility and observed by nurse as pt administered intranasally   This is pt's first time back since having foot surgery, she was walking a bit slower than usual but overall doing okay. She was directed to the treatment room to get vitals taken first. Initial vital signs are B/P at 3:08 PM 135/87, 74, Pt instructed to blow her nose and to recline back at 45 degrees. Pt given first nasal spray (28 mg) administered by pt observed by nurse. There were 5 minutes between each dose, total of 84 mg. Tolerated well. Assessed pt's 40 minute vital signs at 3:55 PM 106/73, 67.  Pt met with [name] and they discussed her care at the end of her treatment when her thoughts are clearer and her medication and moods. She does go to the bathroom at least once during her treatment. No sedation and had slight feeling of being "high" she reports. Discharge vitals at 5:05 PM 110/89, 67. Pt stable for discharge.  Pt was observed on site a total of 120 minutes per FDA/REMS requirements. Pt was with nurse for clinical assessment 50 minutes. Pt is having foot surgery on Thursday, November 7th. Informed her I would be checking on her after surgery as she is worried.     LOT [id number] EXP FEB 2027

## 2023-10-05 ENCOUNTER — Ambulatory Visit (INDEPENDENT_AMBULATORY_CARE_PROVIDER_SITE_OTHER): Payer: 59 | Admitting: Psychiatry

## 2023-10-05 DIAGNOSIS — Z636 Dependent relative needing care at home: Secondary | ICD-10-CM

## 2023-10-05 DIAGNOSIS — F422 Mixed obsessional thoughts and acts: Secondary | ICD-10-CM

## 2023-10-05 DIAGNOSIS — F339 Major depressive disorder, recurrent, unspecified: Secondary | ICD-10-CM | POA: Diagnosis not present

## 2023-10-05 DIAGNOSIS — F4010 Social phobia, unspecified: Secondary | ICD-10-CM

## 2023-10-05 DIAGNOSIS — Z638 Other specified problems related to primary support group: Secondary | ICD-10-CM

## 2023-10-05 NOTE — Progress Notes (Unsigned)
Psychotherapy Progress Note [facility] Casey Czar, PhD LP  Patient ID: Casey Diaz (Casey "Casey Diaz")    MRN: [id number] Therapy format: Individual psychotherapy Date: 10/05/2023      Start: 11:15p     Stop: ***:***     Time Spent: *** min Location: In-person   Session narrative (presenting needs, interim history, self-report of stressors and symptoms, applications of prior therapy, status changes, and interventions made in session) Casey Diaz has been sick last few days, [employer], recent spat about going to tutoring at [employer].  Discussed referrals for new therapist.  Trying to get Casey Diaz to be interested in a summer experience for kids who want to go into medical research (as he does).  Discussed motivational points, especially making sure she has control over her own demanding voice that he sign onto things.  For other scenarios (like impatience getting the car radio on), recommended do her best not to take it personally and use the moment to articualte a contingency -- "after you stop pestering, then I'll turn it on" or some such.    With Casey Diaz, finding him to be unrealistically puny while sick, and some household chores  Re family, there will be a gathering, likely Casey Diaz will sit out and go to a bar.  He has bee nattending work regularly, though a shift in hrs has tempted Casey Diaz to want to intervene for him.  Encouraged to take wins where she has them and still triage perceived needs to manage or teach him.  With mom,   Therapeutic modalities: {AM:23362::"Cognitive Behavioral Therapy","Solution-Oriented/Positive Psychology"}  Mental Status/Observations:  Appearance:   {PSY:22683}     Behavior:  {PSY:21022743}  Motor:  {PSY:22302}  Speech/Language:   {PSY:22685}  Affect:  {PSY:22687}  Mood:  {PSY:31886}  Thought process:  {PSY:31888}  Thought content:    {[phone]}  Sensory/Perceptual disturbances:    {PSY:[phone]}  Orientation:  {Psych  Orientation:23301::"Fully oriented"}  Attention:  {Good-Fair-Poor ratings:23770::"Good"}    Concentration:  {Good-Fair-Poor ratings:23770::"Good"}  Memory:  {[phone]}  Insight:    {Good-Fair-Poor ratings:23770::"Good"}  Judgment:   {Good-Fair-Poor ratings:23770::"Good"}  Impulse Control:  {Good-Fair-Poor ratings:23770::"Good"}   Risk Assessment: Danger to Self: {Risk:22599::"No"} Self-injurious Behavior: {Risk:22599::"No"} Danger to Others: {Risk:22599::"No"} Physical Aggression / Violence: {Risk:22599::"No"} Duty to Warn: {AMYesNo:22526::"No"} Access to Firearms a concern: {AMYesNo:22526::"No"}  Assessment of progress:  {Progress:22147::"progressing"}  Diagnosis: No diagnosis found. Plan:  *** Other recommendations/advice -- As may be noted above.  Continue to utilize previously learned skills ad lib. Medication compliance -- Maintain medication as prescribed and work faithfully with relevant prescriber(s) if any changes are desired or seem indicated. Crisis service -- Aware of call list and work-in appts.  Call the clinic on-call service, 988/hotline, 911, or present to [facility] or ER if any life-threatening psychiatric crisis. Followup -- No follow-ups on file.  Next scheduled visit with me 11/17/2023.  Next scheduled in this office 10/24/2023.  Robley Fries, PhD Casey Czar, PhD LP Clinical Psychologist, [facility] [address], [address] [place], [place] [address] [phone]

## 2023-10-08 ENCOUNTER — Other Ambulatory Visit: Payer: Self-pay | Admitting: Psychiatry

## 2023-10-08 DIAGNOSIS — F422 Mixed obsessional thoughts and acts: Secondary | ICD-10-CM

## 2023-10-08 DIAGNOSIS — F339 Major depressive disorder, recurrent, unspecified: Secondary | ICD-10-CM

## 2023-10-23 ENCOUNTER — Ambulatory Visit (INDEPENDENT_AMBULATORY_CARE_PROVIDER_SITE_OTHER): Payer: 59 | Admitting: Psychiatry

## 2023-10-23 ENCOUNTER — Other Ambulatory Visit: Payer: Self-pay

## 2023-10-23 ENCOUNTER — Telehealth: Payer: Self-pay | Admitting: Psychiatry

## 2023-10-23 ENCOUNTER — Telehealth: Payer: Self-pay

## 2023-10-23 DIAGNOSIS — F422 Mixed obsessional thoughts and acts: Secondary | ICD-10-CM

## 2023-10-23 DIAGNOSIS — F4010 Social phobia, unspecified: Secondary | ICD-10-CM | POA: Diagnosis not present

## 2023-10-23 DIAGNOSIS — Z638 Other specified problems related to primary support group: Secondary | ICD-10-CM

## 2023-10-23 DIAGNOSIS — Z636 Dependent relative needing care at home: Secondary | ICD-10-CM | POA: Diagnosis not present

## 2023-10-23 DIAGNOSIS — F66 Other sexual disorders: Secondary | ICD-10-CM

## 2023-10-23 DIAGNOSIS — F339 Major depressive disorder, recurrent, unspecified: Secondary | ICD-10-CM

## 2023-10-23 MED ORDER — LORAZEPAM 1 MG PO TABS: ORAL_TABLET | ORAL | 0 refills | Status: DC

## 2023-10-23 NOTE — Telephone Encounter (Signed)
 Pt called at 9:47a requesting refill of Lorazepam to   [id number] [place], [place] - [address] [place], [place] [address] Phone: [phone]  Fax: [phone]   Next appt 1/7
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
## 2023-10-23 NOTE — Telephone Encounter (Signed)
 Prior Approval received effective through 10/22/2024 for Spravato 84 mg with [facility]

## 2023-10-23 NOTE — Telephone Encounter (Signed)
 Pended lorazepam 1mg  to rqst pharmacy

## 2023-10-23 NOTE — Telephone Encounter (Signed)
 Prior Authorization submitted with [facility] for renewal of Spravato 84 mg with pharmacy benefits. Pending response

## 2023-10-24 ENCOUNTER — Encounter: Payer: Self-pay | Admitting: Psychiatry

## 2023-10-24 ENCOUNTER — Ambulatory Visit: Payer: 59 | Admitting: Psychiatry

## 2023-10-24 DIAGNOSIS — G43009 Migraine without aura, not intractable, without status migrainosus: Secondary | ICD-10-CM

## 2023-10-24 DIAGNOSIS — F4010 Social phobia, unspecified: Secondary | ICD-10-CM | POA: Diagnosis not present

## 2023-10-24 DIAGNOSIS — F339 Major depressive disorder, recurrent, unspecified: Secondary | ICD-10-CM | POA: Diagnosis not present

## 2023-10-24 DIAGNOSIS — F422 Mixed obsessional thoughts and acts: Secondary | ICD-10-CM

## 2023-10-24 DIAGNOSIS — F5105 Insomnia due to other mental disorder: Secondary | ICD-10-CM

## 2023-10-24 DIAGNOSIS — Z636 Dependent relative needing care at home: Secondary | ICD-10-CM | POA: Diagnosis not present

## 2023-10-24 DIAGNOSIS — Z638 Other specified problems related to primary support group: Secondary | ICD-10-CM

## 2023-10-24 NOTE — Progress Notes (Signed)
 Casey Diaz [id number] June 17, 1966 58 y.o.    Subjective:   Patient ID:  Casey Diaz is a 58 y.o. (DOB [birth year]) female.  Chief Complaint:  Chief Complaint  Patient presents with   Follow-up   Depression   Anxiety   Stress    family     HPI Casey Diaz presents to the office today for follow-up of OCD and severe anxiety.     December 2019 visit the following was noted: No meds were changed. Lives in [place] and back for followup.  Sx are about the same.  Has to take meds with different sizes. Pt reports that mood is Anxious and Depressed and describes anxiety as Severe. Anxiety symptoms include: Excessive Worry, Obsessive Compulsive Symptoms:   Checking,,. Pt reports has interrupted sleep and nocturia. Pt reports that appetite is good. Pt reports that energy is no change and down slightly. Concentration is down slightly. Suicidal thoughts:  denied by patient. Loves the environment of [place] but misses some things there.  She's not able to work there.  H works there and likes it.  Struggled with not working, feels isolated and not up to task of meeting people.  Does attend a church and met a friend who's been helpful.  Leaving for [place] on 10/16/18.   04/09/2020 appointment the following is noted:  Staying another year in [place] bc Covid and other things. Last few months a lot of crying spells.  Is in menopause. Wonders about med changes though is nervous about it.  Crying spells associated with depressing thoughts more than stress or OCD.   Covid really hard on everyone and couldn't see family for 18 mos.  Family still very dysfunctional. No close friends in part due to OCD and depression. Son high Autism spectrum with ADHD and anxiety and she's with him all the time. Greater health problems with CP so more pains.   05/15/20 appt with the following noted: Started Estroven for menopause and helps some. Still depressed.  Chronically. In [place]  for 2 more weeks then to  [place] for another year. A lot of stressors lately triggering more checking and anxiety.   OCD is her CC now and seems.  Got worse DT stress.   Stressed with Asberger's son and her health.  H works a lot.  Her FOO still stress. Plan: Trintellix 10 mg 1 tablet in the morning with food and reduce fluvoxamine  to 5 tablets nightly for 1 week  then reduce it to 4 tablets nightly.   07/02/20 appt with the following noted: Decided not to get Trintellix bc difficulty getting it. It is available.  There.  Wants to start it now.   Both depression and OCD are severe.  Not suicidal in intent or plan. Did not take samples with her to [place] but will be back in December. covid is worse there and travel is difficult.  Wants to reduce Wellbutrin  DT dry mouth. Plan: She's afraid to reduce Luvox  at this time DT fear of worsening OCD.  But will consider. Trintellix 10 mg 1 tablet in the morning with food and reduce fluvoxamine  to 5 tablets nightly for 1 week  then reduce it to 4 tablets nightly. Also reduce Wellbutrin  XL to 300 mg daily.    9-13 2022 appointment with the following noted: [place]  Broke arm a month ago and surgery.  It's all been rough adjustment.   B has cancer on his face and M fell taking him  to the doctor.  Misses the water and weather of [place].   Cry a lot more since menopause. Still depression and anxiety and OCD.  Asks about ketamine. On Wellbutrin  300, Luvox  300.  No Trintellix. Added Ativan  2 mg AM and HS and it helps.  More likely to get upset at night. Plan: Increase Luvox  back to 400 mg daily.  She thinks she's worse on less. Continue Wellbutrin  XL to 300 mg daily. Plan to start Spravato  for TRD asap   09/27/2021 appointment with the following noted:  She has started Spravato  today at 54 mg intranasally.  She tolerated it well without unusual nausea or vomiting headache or other somatic symptoms.  She did have the expected dissociation which gradually  resolved over the course of the 2-hour period of observation.  She was a little concerned about her balance given her cerebral palsy but has not noted unusual or unexpected problems.  She is motivated to can continue Spravato  in hopes of reducing her depressive symptoms. 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  She also has treatment resistant OCD which is partially managed with medications but is still quite disabling.  She is tolerating the medications well.  She is sleeping adequately.  Her appetite is adequate.  She is not having suicidal thoughts.  She continues to wish for a better treatment for OCD that would give her some relief.  09/30/2021 appointment with the following noted: She received her first dose of Spravato  84 mg intranasally today.  She tolerated it well without unusual nausea, vomiting, or other somatic symptoms.  Dissociation as expected did occur and gradually resolved over the 2-hour period of observation.  She did have a mild headache today with the treatment and received ibuprofen 600 mg at her request.  We will follow this to see if it is a pattern Patient is still depressed.  She said she was late with her medicine today and today was a particularly depressing day.  However she notes that the Spravato  has lifted her mood considerably even today.  She is hopeful that it will continue to be helpful.  No suicidal thoughts.  She has ongoing chronic anxiety and OCD at baseline.  10/04/21 appt noted: Patient received Spravato  84 mg for the second time today.  She tolerated it well without any unusual headache, nausea or vomiting or other somatic symptoms.  Dissociation did occur and she gradually [place] resolution over the 2-hour period of observation. She did not have any unusual problems after she left the office last Spravato  administration.  She did not have any specific problems with balance or walking.  She is at increased risk of that  difficulty because of cerebral palsy.  So far she has not noticed much mood effect from the medication beyond the first day of receiving it.  However she would like to continue Spravato  in hopes of getting the antidepressant effect that is desired. Stress dealing with mother's behavior at party pt hosted.  Guilt over it.  10/07/2021 appointment noted: Patient received Spravato  84 mg for the second time today.  She tolerated it well without any unusual headache, nausea or vomiting or other somatic symptoms.  Dissociation did occur and she gradually [place] resolution over the 2-hour period of observation. She still is not sure about the antidepressant effect of Spravato .  Events over the holidays and demands, make it difficult to assess.  She still notes that the OCD tends to worsen the depression and vice versa.  She tolerates the Spravato  well and wants to continue the trial.  10/15/2021 appointment with the following noted: Patient received Spravato  84 mg for the second time today.  She tolerated it well without any unusual headache, nausea or vomiting or other somatic symptoms.  Dissociation did occur and she gradually [place] resolution over the 2-hour period of observation. Patient says it was somewhat difficult to evaluate the effect of the Spravato .  It was scheduled to be twice weekly for 4 weeks consecutively but the holidays have interfered with that administration.  She asked what specifically should be she should be looking for in order to assess improvement.  That was discussed.  The OCD is unchanged and the depression so far is not significantly different.  She still tolerates meds.  There have been no recent med changes  10/19/2021 appt noted: Patient received Spravato  84 mg for the second today.  She tolerated it well without any unusual headache, nausea or vomiting or other somatic symptoms.  Dissociation did occur and she gradually saw resolution over the 2-hour period of observation.    10/21/2021 appointment noted: Patient received Spravato  84 mg today.  She tolerated it well without any unusual headache, nausea or vomiting or other somatic symptoms.  Dissociation did occur and she gradually saw resolution over the 2-hour period of observation.  She feels better than last week.  She is not as depressed and down.  She is still dealing with grief around the death of her cousin that was unexpected.  It is still difficult to tell how much the Spravato  was doing but she is hopeful.  Anxiety is still present with the OCD.  She is not having suicidal thoughts.  She is not hopeless.  She wants to continue treatment.  10/25/2021 appointment with the following noted: Patient received Spravato  84 mg today.  She tolerated it well without any unusual headache, nausea or vomiting or other somatic symptoms.  Dissociation did occur and she gradually saw resolution over the 2-hour period of observation.  She does not typically find the dissociation very strong. She is beginning to think the Spravato  is helping somewhat with the depression.  It has been difficult to tell with the holidays intervening as well as the death of her cousin.  She has not been able to get Spravato  twice weekly for 4 weeks straight as typically planned.  However she is hopeful.  The OCD remains significant.  She still has a tendency to think very negatively.  She is not suicidal.  10/28/2021 appointment with the following noted: Patient received Spravato  84 mg today.  She tolerated it well without any unusual headache, nausea or vomiting or other somatic symptoms.  Dissociation did occur and she gradually saw resolution over the 2-hour period of observation.  She does not typically find the dissociation very strong. She is feeling more hopeful about the administration of Spravato .  She is having less depression she believes.  Still not dramatically different.  She still has a tendency to have a lot of anxiety and rumination and  OCD.  She is not suicidal.  She is eager to continue the Spravato .  11/01/2021 appointment with the following noted: Patient received Spravato  84 mg today.  She tolerated it well without any unusual headache, nausea or vomiting or other somatic symptoms.  Dissociation did occur and she gradually saw resolution over the 2-hour period of observation.  She does not typically find the dissociation very strong. She is continuing to see a little bit of  improvement in depression with Spravato .  The anxiety remains but may be not as severe.  The OCD remains markedly severe chronically.  She is not suicidal.  She is encouraged by the degree of improvement with Spravato  and inability to enjoy things more and not be quite as ruminative.  11/04/2021 appt noted: Patient received Spravato  84 mg today.  She tolerated it well without any unusual headache, nausea or vomiting or other somatic symptoms.  Dissociation did occur and she gradually saw resolution over the 2-hour period of observation.  She does not typically find the dissociation very strong. No SE complaints with meds. She continues to feel hopeful about the Spravato .  She has less depression.  Because of a number of factors she is uncertain of the full benefit but thinks she is somewhat less depressed.  Her anxiety and OCD remain significant but a little better.  She is tolerating the medications and does not desire medicine change.  She is not currently complaining of insomnia.   11/08/2021 appointment the following noted: Patient received Spravato  84 mg today.  She tolerated it well without any unusual headache, nausea or vomiting or other somatic symptoms.  Dissociation did occur and she gradually saw resolution over the 2-hour period of observation.  She does not typically find the dissociation very strong. No SE complaints with meds. She feels the Spravato  is helping somewhat.  She would like to see a greater effect.  However she is able to enjoy things.   She is productive at home.  She would like to see a lifting of a degree of sadness that remains.  The anxiety and OCD remained largely unchanged.  She wondered about the dosing of Wellbutrin  300 mg a day and Luvox  300 mg a day and possible increases.  She has been at higher doses in the past.  She plans to start water therapy for her weakness and for her shoulder.  11/11/2021 appointment with the following noted: Patient received Spravato  84 mg today.  She tolerated it well without any unusual headache, nausea or vomiting or other somatic symptoms.  Dissociation did occur and she gradually saw resolution over the 2-hour period of observation.  She does not typically find the dissociation very strong. No SE complaints with meds. She feels the Spravato  is clearly helping the depression.  She would like to see a more significant effect.  She is still having trouble thinking positive. Her energy is fair.  Concentration is good except for the problem with chronic obsessions. She has been taking Wellbutrin  300 mg in Luvox  300 mg for quite some time but has taken higher doses in the past.  We discussed that.  She would like to try higher doses in order to get a better effect if possible. We just increased the doses a couple of days ago.  No effect yet.  11/15/2021 appointment with the following noted: Patient received Spravato  84 mg today.  She tolerated it well without any unusual headache, nausea or vomiting or other somatic symptoms.  Dissociation did occur and she gradually saw resolution over the 2-hour period of observation.  She does not typically find the dissociation very strong. No SE complaints with meds. The patient is now convinced that the Spravato  is helping the depression.  She would like to continue twice weekly Spravato  this week if possible.  She has tolerated the increase in Wellbutrin  to 450 mg daily and the increase and fluvoxamine  to 400 mg daily without complications thus far.  The OCD  and anxiety feed the depression to some extent. She spends approximately 2 hours daily with checking compulsions due to obsessions about causing harm to others.  For example fearing that when she has hit a pot hole that she may have hit a person and going back to check.  Checking corners and rooms out of fear that she may have harmed someone.  Other various checking compulsions.  She is hoping the increase in fluvoxamine  to 400 mg will reduce that over the weeks to come.  She is not seeing a significant difference with the addition of the Spravato  though she understands that was not expected.  She is more productive at home and more motivated and able to enjoy things more fully as a result of the Spravato  treatment.  She is tolerating the medication  11/18/2021 appointment with the following noted: Patient received Spravato  84 mg today.  She tolerated it well without any unusual headache, nausea or vomiting or other somatic symptoms.  Dissociation did occur and she gradually saw resolution over the 2-hour period of observation.  She does not typically find the dissociation very strong. No SE complaints with meds. She clearly believes the Spravato  has been helpful for the depression.  She wonders whether to continue to treatments weekly or to cut back to 1 weekly.  She would like to continue twice weekly in hopes of getting additional improvement in the depression because it is not resolved but it is difficult to get here twice a week in terms of arranging rides. She is recently increased Wellbutrin  XL to 450 mg daily and fluvoxamine  to 400 mg daily but they have not had time to have an official effect.  She is tolerating that well.  She is tolerating meds overwork overall well. The OCD remains the same as noted on 11/15/2021  11/25/21 appt noted: Patient received Spravato  84 mg today.  She tolerated it well without any unusual headache, nausea or vomiting or other somatic symptoms.  Dissociation did occur and  she gradually saw resolution over the 2-hour period of observation.  She does not typically find the dissociation very strong. No SE complaints with meds. She thinks the increase in Wellbutrin  and Luvox  have been potentially helpful for depression and OCD respectively.  It has been too early to see the full effect.  She is sleeping and eating well.  She is functioning at home.  She still spends a lot of time that is about 2 hours a day dealing with compulsive behaviors.  12/02/21 appt noted: Patient received Spravato  84 mg today.  She tolerated it well without any unusual headache, nausea or vomiting or other somatic symptoms.  Dissociation did occur and she gradually saw resolution over the 2-hour period of observation.  She does not typically find the dissociation very strong. No SE complaints with meds. Several losses and stressors recently that affect her sense of mood. However still sees significant benefit from the Spravato  for her depression.  Wants to continue it. Suspect early  some benefit from the increased Wellbutrin  for depression and Luvox  for OCD. Tolerating meds. No complaints about the meds. Sleeping and eating well.  No new health concerns.  12/09/21 appt noted: Patient received Spravato  84 mg today.  She tolerated it well without any unusual headache, nausea or vomiting or other somatic symptoms.  Dissociation did occur and she gradually saw resolution over the 2-hour period of observation.  She does not typically find the dissociation very strong. No SE complaints with meds. Seeing noticeable  improvement from increase fluvoxamine  to 400 mg daily.  Tolerating meds without concerns over them. Depression is stable with residual sx of easy guilt and easily stressed.  OCD contributes to depression but depression is not severe with less crying spells.  Productive at home with chores.  Enjoyed recent birthday.  Sleeping good. No new concerns.  12/23/2021 appointment noted: Patient  received Spravato  84 mg today.  She tolerated it well without any unusual headache, nausea or vomiting or other somatic symptoms.  Dissociation did occur and she gradually saw resolution over the 2-hour period of observation.  She does not typically find the dissociation very strong. No SE complaints with meds. Seeing noticeable improvement from increase fluvoxamine  to 400 mg daily.  Tolerating meds without concerns over them. Her depression is somewhat improved with the Spravato .  She also feels generally a little lighter.  She is more motivated.  She is less overwhelmed by guilt.  The OCD is gradually improving but is still quite time-consuming as noted before.  She is sleeping well.  No side effects  12/30/2021 appointment with the following noted: Patient received Spravato  84 mg today.  She tolerated it well without any unusual headache, nausea or vomiting or other somatic symptoms.  Dissociation did occur and she gradually saw resolution over the 2-hour period of observation.  She does not typically find the dissociation very strong. No SE complaints with meds. Seeing noticeable improvement from increase fluvoxamine  to 400 mg daily.  Tolerating meds without concerns over them. She is confident of her the improvement seen with Spravato .  She is less hopeless.  Guilt is marked remarkably improved.  She is not having any thoughts of death or dying.  She is more motivated for activities such as exercise which she is recently started.  She is sleeping well. The OCD remains severe but it is improving somewhat with the increase in fluvoxamine .  It is still consuming a couple hours per day.  01/10/22 apravato 84 admin  01/24/22 appt noted: Patient received Spravato  84 mg today.  She tolerated it well without any unusual headache, nausea or vomiting or other somatic symptoms.  Dissociation did occur and she gradually saw resolution over the 2-hour period of observation.  She does not typically find the  dissociation very strong. No SE complaints with meds. Very tearful today.  Feels like she has been suppressing emotion in the Spravato  caused it to be released.  Discussed some stressors.  Overall still feels the medicine is helpful.  She has missed some of the scheduled Spravato  treatments that were intended to be weekly due to circumstances beyond her control.  She is still struggling with OCD as previously noted but does believe the medications are helpful. Plan no med changes  01/31/2022 received Spravato  84 mg today  02/09/2022 appointment with the following noted: Patient received Spravato  84 mg today.  She tolerated it well without any unusual headache, nausea or vomiting or other somatic symptoms.  Dissociation did occur and she gradually saw resolution over the 2-hour period of observation.  She does not typically find the dissociation very strong. No SE complaints with meds. Spravato  clearly helps depression and OCD but easily gets overwhelmed and tearful with fairly routine stressors.  Tolerating meds. Sleep and appetite is OK Asks to increase lorazepam  to 2 mg AM and HS and 1mg  afternoon  02/16/22 appt noted: Patient received Spravato  84 mg today.  She tolerated it well without any unusual headache, nausea or vomiting or other somatic symptoms.  Dissociation did occur and she gradually saw resolution over the 2-hour period of observation.  She does not typically find the dissociation very strong. No SE complaints with meds. She has chronic depesssion and OCD but is improved with Spravato , both dx versus before.  She has continued Luvox  400 mg and Wellbutrin  450 mg and is tolerating it.  Chronically easily stressed.  Tolerating all meds.  Doesn't like taking more meds.  Spending a couple hours daily with OCD.  No SI No med changes.  02/21/22 appt noted:   Doesn't like taking more meds.  Spending a couple hours daily with OCD.  No SI No med changes.  02/21/22 appt noted: Patient received  Spravato  84 mg today.  She tolerated it well without any unusual headache, nausea or vomiting or other somatic symptoms.  Dissociation did occur and she gradually saw resolution over the 2-hour period of observation.  She does not typically find the dissociation very strong. No SE complaints with meds. She has chronic depesssion and OCD but is improved with Spravato , both dx versus before.  She has continued Luvox  400 mg and Wellbutrin  450 mg and is tolerating it.  Chronically easily overwhelmed and doesn't know why.  Tolerating all meds. Wants to continue meds.  03/16/22 appt noted: Patient received Spravato  84 mg today.  She tolerated it well without any unusual headache, nausea or vomiting or other somatic symptoms.  Dissociation did occur and she gradually saw resolution over the 2-hour period of observation.  She does not typically find the dissociation very strong. No SE complaints with meds. Overall she still feels the Spravato  has been helpful not only for her depression but also for her OCD which was somewhat unexpected.  OCD is still significant but it is less severe than prior to starting Spravato .  She is tolerating Luvox  400 mg and Wellbutrin  450 mg.  We discussed possible med adjustments.  03/23/22 appt noted: Patient received Spravato  84 mg today.  She tolerated it well without any unusual headache, nausea or vomiting or other somatic symptoms.  Dissociation did occur and she gradually saw resolution over the 2-hour period of observation.  She does not typically find the dissociation very strong. No SE complaints with meds. She is still depressed and still has OCD of course but is improved with the Spravato .  She is tolerating the medications well.  We had previously discussed the possibility of switching some of the Wellbutrin  to [place]velity  and she is very interested in that in hopes of further improvement in depression and OCD.  She understands that Auvelity  is not used for OCD on the label.   She is tolerating the medications.  She is still easily overwhelmed.  She is sleeping and eating okay.. Plan: Reduce Wellbutrin  XL to 300 mg AM and add Auvelity  1 tablet each AM  03/30/22 appt noted: Patient received Spravato  84 mg today.  She tolerated it well without any unusual headache, nausea or vomiting or other somatic symptoms.  Dissociation did occur and she gradually saw resolution over the 2-hour period of observation.  She does not typically find the dissociation very strong. No SE complaints with meds. She is still depressed and still has OCD of course but is improved with the Spravato .  She is tolerating the medications well.  No difference with Auvelity  1 AM so far and no SE.  Going on vacation on Saturday. 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 Sleep and appetite good. Plan: Increase Auvelity  to 1 twice daily and  reduce Wellbutrin  to XL 150 every morning  04/14/2022 appointment with the following noted: Patient received Spravato  84 mg today.  She tolerated it well without any unusual headache, nausea or vomiting or other somatic symptoms.  Dissociation did occur and she gradually saw resolution over the 2-hour period of observation.  She does not typically find the dissociation very strong. No SE complaints with meds. She is still depressed and still has OCD of course but is improved with the Spravato .  She is tolerating the medications well.  Just increased Auvelity  to BID yesterday and reduced Wellbutrin  to 150 AM. No SE so far.  No change in mood or anxiety so far.  Chronic OCD as noted and residual depression and chronic fatigue.  04/21/2022 appointment with the following noted: Patient received Spravato  84 mg today.  She tolerated it well without any unusual headache, nausea or vomiting or other somatic symptoms.  Dissociation did occur and she gradually saw resolution over the 2-hour period of observation.  She does not typically find the dissociation very strong. No  SE complaints with meds. She is still depressed and still has OCD of course but is improved with the Spravato .   She has questions about the dosing of lorazepam . She tends to have negative anxious thoughts at night.  This tends to interfere with her ability to go to sleep.  She is getting about 8 to 9 hours of sleep.  She is tolerating the meds without excessive sedation and does not nap during the day.  05/06/22 appt noted: Patient received Spravato  84 mg today.  She tolerated it well without any unusual headache, nausea or vomiting or other somatic symptoms.  Dissociation did occur and she gradually saw resolution over the 2-hour period of observation.  She does not typically find the dissociation very strong. No SE complaints with meds. She is still depressed and still has OCD of course but is improved with the Spravato .   Had some questions about timing of dosing of fluvoxamine  and Auvelity . OCD is not quite as time consuming.  Sleep and eating are the same.   No SE meds.  05/25/22 appt noted: Patient received Spravato  84 mg today.  She tolerated it well without any unusual headache, nausea or vomiting or other somatic symptoms.  Dissociation did occur and she gradually saw resolution over the 2-hour period of observation.  She does not typically find the dissociation very strong. No SE complaints with meds. She is still depressed and still has OCD of course but is improved with the Spravato .   She has less OCD when away from home and on vacation of note. Plan: Rec gradually reduce HS lorazepam  to 1 mg Hs.  Can continue lorazepam  2 mg AM and 1 mg in afternoon bc of chronic anxiety and it is helpful and tolerated. She can continue temazepam  30 mg nightly.  She tends to have a lot of anxious negative thoughts at night when she is trying to go to bed  06/16/22 appt noted: Patient received Spravato  84 mg today.  She tolerated it well without any unusual headache, nausea or vomiting or other somatic  symptoms.  Dissociation did occur and she gradually saw resolution over the 2-hour period of observation.  She does not typically find the dissociation very strong. No SE complaints with meds. She is still depressed and still has OCD of course but is improved with the Spravato .   She has less OCD when away from home and on vacation of note. She  is tolerating the medications.  She has continued current medications. 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  She still receives benefit from Spravato .  Her depression and OCD are better with the Spravato .  She thinks she is getting additional benefit with the switch from Wellbutrin  to Auvelity .  07/04/2022 appointment noted: Reports she developed a rash on her face from Auvelity  and feels like she is allergic to it.  She stopped it and went back to Wellbutrin  450 mg every morning.  The rash has cleared up.  She did not require any medical attention and did not have shortness of breath. Overall her depression and OCD are about the same as they have been.  She did not notice a substantial difference from the brief treatment with Auvelity  but she understands she did not take a full course.  She is tolerating the current medicines well. Current meds fluvoxamine  400 mg daily, Wellbutrin  XL 450 mg daily, lorazepam  1 to 2 mg in the morning and 1 to 2 mg at night and 1 mg in the afternoon, temazepam  30 mg nightly. She wants to continue the Spravato  because she feels it has been helpful for both her depression and her racing OCD  07/18/22 appt noted: Patient received Spravato  84 mg today.  She tolerated it well without any unusual headache, nausea or vomiting or other somatic  symptoms.  Dissociation did occur and she gradually saw resolution over the 2-hour period of observation.  She does not typically find the dissociation very strong. No SE complaints with meds. She is still depressed and still has OCD of course but is improved with the Spravato .  Rash better off Auvelity  and back on Welllbutrin XL 450 mg AM, fluvoxamine  400 mg daily.  08/15/22 appt noted: Current psych meds: Wellbutrin  XL 450 mg AM, fluvoxamine  100 mg in AM and 300 mg HS, lorazepam  1 mg 1-2 mg in the AM and HS and 1 tablet prn midday for anxiety, temazepam  30 mg HS Patient received Spravato  84 mg today.  She tolerated it well without any unusual headache, nausea or vomiting or other somatic symptoms.  Dissociation did occur and she gradually saw resolution over the 2-hour period of observation.  She does not typically find the dissociation very strong. No SE complaints with meds. She has a great deal of stress dealing with her family.  Disc brother's ongoing mania and difficulty getting him help and the stress he causes for the family. She wants to continue Spravato  through this very stressful holdicay season and reevaluate the frequency after the New Year.  09/12/22 appt noted: Current psych meds: Wellbutrin  XL 450 mg AM, fluvoxamine  100 mg in AM and 300 mg HS, lorazepam  1 mg 1-2 mg in the AM and HS and 1 tablet prn midday for anxiety, temazepam  30 mg HS Patient received Spravato  84 mg today.  She tolerated it well without any unusual headache, nausea or vomiting or other somatic symptoms.  Dissociation did occur and she gradually saw resolution over the 2-hour period of observation.  She does not typically find the dissociation very strong. No SE complaints with meds. She has a great deal of stress dealing with her family.  Disc brother's ongoing mania and difficulty getting him help and the stress he causes for the family. She wants to continue Spravato  through this very stressful holdicay season  and reevaluate the frequency after the New Year.  Chronically easily overwhelmed with family. Complaining of HA and history migraine.  Asks for increase imitrex  and disc preventatives like propranolol  ER  09/26/22 appt noted: Current psych meds: Wellbutrin  XL 450 mg AM, fluvoxamine  100 mg in AM and 300 mg HS, lorazepam  1 mg 1-2 mg in the AM and HS and 1 tablet prn midday for anxiety, temazepam  30 mg HS Patient received Spravato  84 mg today.  She tolerated it well without any unusual headache, nausea or vomiting or other somatic symptoms.  Dissociation did occur and she gradually saw resolution over the 2-hour period of observation.  She does not typically find the dissociation very strong. No SE complaints with meds. She has a great deal of stress dealing with her family.  This is ongoing The holidays are much more stressful DT family problems.  She is noting OCD is much worse over the last couple of week.  Depression is better with Spravato . Needed higher dose meds for migraine.   10/03/22 appt noted: Current psych meds: Wellbutrin  XL 450 mg AM, fluvoxamine  100 mg in AM and 300 mg HS, lorazepam  1 mg 1-2 mg in the AM and HS and 1 tablet prn midday for anxiety, temazepam  30 mg HS Patient received Spravato  84 mg today.  She tolerated it well without any unusual headache, nausea or vomiting or other somatic symptoms.  Dissociation did occur and she gradually saw resolution over the 2-hour period of observation.  She does not typically find the dissociation very strong. No SE complaints with meds. She has now realized that the rash she previously previously attributed to Auvelity  was not related.  She is interested may be retrying that after the holidays.  She is tolerating medications otherwise. The holidays remain chronically stressful to her due to family dynamic problems which cause her to consistently feel stuck.  Under more stress her OCD is worse.  She will have a tendency to have crying spells.   The depression and OCD are still improved with Spravato  as compared to before.  11/15/22 appt noted: Current psych meds: Wellbutrin  XL 450 mg AM, fluvoxamine  100 mg in AM and 300 mg HS, lorazepam  1 mg 1-2 mg in the AM and HS and 1 tablet prn midday for anxiety, temazepam  30 mg HS Patient received Spravato  84 mg today.  She tolerated it well without any unusual headache, nausea or vomiting or other somatic symptoms.  Dissociation did occur and she gradually saw resolution over the 2-hour period of observation.  She does not typically find the dissociation very strong. No SE complaints with meds. Continues to feel depressed and overwhelmed by family problems including her brother's mania and recent eviction and commitment.  Chronic OCD worse when stressed.  No SI.  Tolerating meds. Plan: Per her request continue Wellbutrin  XL 450 mg every morning. She has come to the realization that the rash she had previously attributed to Auvelity  was not related.  She is interested in perhaps retrying Auvelity . There are few alternative medication options that remain.  01/11/23 appt noted: Current psych meds: Wellbutrin  XL 150 mg BID and started Auvelity  1 AM, fluvoxamine  100 mg in AM and 300 mg HS, lorazepam  1 mg 1-2 mg in the AM and HS and 1 tablet prn midday for anxiety, temazepam  30 mg HS  Patient received Spravato  84 mg today.  She tolerated it well without any unusual headache, nausea or vomiting or other somatic symptoms.  Dissociation did occur and she gradually saw resolution over the 2-hour period of observation.  She does not typically find the dissociation very strong. No SE complaints with meds. Trouble getting to sessions lately DT transportation problems.   Continues to feel depressed and overwhelmed by OCD and family.  Feels she needs toevery other week Spravato  bc it helps for a couple of weeks and then seems to wear off.  Struggleing with OCD and depression both of which are eased by Spravato . Less  crying with Auvelity .  02/07/23 appt noted: Current psych meds: Wellbutrin  XL 150 mg BID and started Auvelity  1 AM, fluvoxamine  100 mg in AM and 300 mg HS, lorazepam  1 mg 1-2 mg in the AM and HS and 1 tablet prn midday for anxiety, temazepam  30 mg HS Patient received Spravato  84 mg today.  She tolerated it well without any unusual headache, nausea or vomiting or other somatic symptoms.  Dissociation did occur and she gradually saw resolution over the 2-hour period of observation.  She does not typically find the dissociation very strong. No SE complaints with meds. Trouble getting to sessions lately DT transportation problems.  This is a problem ongoing and thinks she might need to pause Spravato  bc won't be able to get her for at least 3 weeks. She is holding pretty steady with a moderate level of anxiety and depression ongoing and chronic.    03/20/23 appt noted: Current psych meds: Wellbutrin  XL 150 mg BID and started Auvelity  1 AM, fluvoxamine  100 mg in AM and 300 mg HS, lorazepam  1 mg 1-2 mg in the AM and HS and 1 tablet prn midday for anxiety, temazepam  30 mg HS Patient received Spravato  84 mg today.  She tolerated it well without any unusual headache, nausea or vomiting or other somatic symptoms.  Dissociation did occur and she gradually saw resolution over the 2-hour period of observation.  She does not typically find the dissociation very strong. No SE complaints with meds. She is trying to get back into more regular Spravato  administration.  Family issues and transportation problems that led to her missing Spravato .  She feels more depressed without regular Spravato .  Her OCD is chronic but also worse when she misses Spravato .  She does not want any medication changes.  04/03/23 appt noted: Current psych meds: Wellbutrin  XL 150 mg BID and started Auvelity  1 AM, fluvoxamine  100 mg in AM and 300 mg HS, lorazepam  1 mg 1-2 mg in the AM and HS and 1 tablet prn midday for anxiety, temazepam  30 mg  HS Patient received Spravato  84 mg today.  She tolerated it well without any unusual headache, nausea or vomiting or other somatic symptoms.  Dissociation did occur and she gradually saw resolution over the 2-hour period of observation.  She does not typically find the dissociation very strong.  It gets rid of negative emotion for awhile after procedure and would like it to last longer.   No SE complaints with meds.  Doesn't really want med changes.   Planning to weekly Spravato .  It is helping dep and anxiety.    04/10/23 appt noted: Current psych meds: Wellbutrin  XL 150 mg BID and started Auvelity  1 AM, fluvoxamine  100 mg in AM and 300 mg HS, lorazepam  1 mg 1-2 mg in the AM and HS and 1 tablet prn midday for anxiety, temazepam  30 mg  HS Patient received Spravato  84 mg today.  She tolerated it well without any unusual headache, nausea or vomiting or other somatic symptoms.  Dissociation did occur and she gradually saw resolution over the 2-hour period of observation.  She does not typically find the dissociation very strong.  It gets rid of negative emotion for awhile after procedure and would like it to last longer.   No SE complaints with meds.  Doesn't really want med changes.   Had lidocaine  shot for back pain with brief benefit.  Doing some water based PT Spravato  went well today. Got pretty upset last week with event related to son's activities.  Got upset with son saying something inappropriate publicly.  Was so embarrassed.  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    He's kind of rebellious lately.  Son is 59 yo.    05/03/23 appt noted: Current psych meds: Wellbutrin  XL 150 mg BID and started Auvelity  1 AM, fluvoxamine  100 mg in AM and 300 mg HS, lorazepam  1 mg 1-2 mg in the AM and HS and 1 tablet prn midday for anxiety, temazepam  30 mg HS No SE Patient received Spravato  84 mg today.  She tolerated it well without any unusual headache, nausea or  vomiting or other somatic symptoms.  Dissociation did occur and she gradually saw resolution over the 2-hour period of observation.  She does not typically find the dissociation very strong.  It gets rid of negative emotion for awhile after procedure and would like it to last longer.   Still pleased with benefit from Spravato  for mood and anxiety. No SE complaints with meds.  Doesn't really want med changes.   Chronic family px ongoing affects her but nothing she can change.H sick of the family drama.   Continuing counseling helps.  Has some support. Mood and anxiety pretty steady but did go on vacation to [place].  Anxiety worse first in AM and then later at night with mind racing on stressors.   Still back trouble.  05/23/23 appt noted: Current psych meds: Wellbutrin  XL 150 mg BID and started Auvelity  1 AM, fluvoxamine  100 mg in AM and 300 mg HS, lorazepam  1 mg 1-2 mg in the AM and HS and 1 tablet prn midday for anxiety, temazepam  30 mg HS No SE Patient received Spravato  84 mg today.  She tolerated it well without any unusual headache, nausea or vomiting or other somatic symptoms.  Dissociation did occur and she gradually saw resolution over the 2-hour period of observation.  She does not typically find the dissociation very strong.  It gets rid of negative emotion for awhile after procedure and would like it to last longer.   Still pleased with benefit from Spravato  for mood and anxiety by 50%. No SE complaints with meds. Chronic family stress interferes with mental health and self care.  May have to reduce frequency of Spravato  bc of this. But is status quo with meds.  Chronic residual OCD which is mod severe and dep moderate.  05/30/23 appt noted: Current psych meds: Wellbutrin  XL 150 mg BID and started Auvelity  1 AM, fluvoxamine  100 mg in AM and 300 mg HS, lorazepam  1 mg 1-2 mg in the AM and HS and 1 tablet prn midday for anxiety, temazepam  30 mg HS No SE Patient received Spravato  84 mg  today.  She tolerated it well without any unusual headache, nausea or vomiting or other somatic symptoms.  Dissociation did occur and she gradually saw resolution over the 2-hour period of observation.  She does not typically find the dissociation very strong.  It gets rid of negative emotion for awhile after procedure and would like it to last longer.   Still pleased with benefit from Spravato  for mood and anxiety by 50% or better but not resolved. She wants to continue Spravato  as frequently as schedule will allow. Both depression and OCD are better with most improvement in mood.  Asks about any new treatments for OCD. No SE complaints with meds. Chronic family stress interferes with mental health and self care.  This is an ongoing drain on her mood and resources emotionally.    06/06/23 appt noted: Current psych meds: Wellbutrin  XL 150 mg BID and started Auvelity  1 AM, fluvoxamine  100 mg in AM and 300 mg HS, lorazepam  1 mg 1-2 mg in the AM and HS and 1 tablet prn midday for anxiety, temazepam  30 mg HS No SE Patient received Spravato  84 mg today.  She tolerated it well without any unusual headache, nausea or vomiting or other somatic symptoms.  Dissociation did occur and she gradually saw resolution over the 2-hour period of observation.  She does not typically find the dissociation very strong.  It gets rid of negative emotion for awhile after procedure and would like it to last longer.   Still believes each meds work and are helpful and well tolerated.  Specifically she thinks that the Auvelity  adds additional benefit on taking Wellbutrin .  She believes the meds help both depression and OCD though the OCD remains a problem.  She also believes Spravato  helps both types of symptoms as well.  Her mood is variable.  She experiences a great deal of stress from her family and those problems wax and wane.  She has bad days because of it and today is 1 of those days.  She is continuing counseling and that is  helpful.  Due to family demands she may have to spread out the Spravato  frequency.  06/16/23 appt noted:  Current psych meds: Wellbutrin  XL 150 mg BID and started Auvelity  1 AM, fluvoxamine  100 mg in AM and 300 mg HS, lorazepam  1 mg 1-2 mg in the AM and HS and 1 tablet prn midday for anxiety, temazepam  30 mg HS No SE Patient received Spravato  84 mg today.  She tolerated it well without any unusual headache, nausea or vomiting or other somatic symptoms.  Dissociation did occur and she gradually saw resolution over the 2-hour period of observation.  She does not typically find the dissociation very strong.  It gets rid of negative emotion for awhile after procedure . She is trying to make the Schedule work for Spravto oh every week or every other week because she is aware the treatment effects can be lost if it is time less frequently.  So she has been able to come the last 2 weeks, we will try to come in 2 weeks.  Spravato  reduces depression and OCD significantly though she remains highly symptomatic with both.  However she has no suicidal thoughts.  Crying spells are reduced with Spravato .  She still spends a fair amount of time meaning over an hour a day checking and more under stress or when tired.  She asks about any new meds for OCD. Plan: increase Auvelity  1 in AM & PM Hold Wellbutrin  XL 150 mg BID  07/12/23 appt noted: Current psych meds: Wellbutrin  XL 150 mg BID and started Auvelity   1 AM, fluvoxamine  100 mg in AM and 300 mg HS, lorazepam  1 mg 1-2 mg in the AM and HS and 1 tablet prn midday for anxiety, temazepam  30 mg HS.  Didn't increase Auvelity  yet No SE Patient received Spravato  84 mg today.  She tolerated it well without any unusual headache, nausea or vomiting or other somatic symptoms.  Dissociation did occur and she gradually saw resolution over the 2-hour period of observation.  She does not typically find the dissociation very strong.  It gets rid of negative emotion for awhile after  procedure . She has not increased the Auvelity  yet is planned.  She has a little bit of nervousness about it but admits is not rational.  She is willing to give that a try to try to get better control of depression and anxiety.  She continues to see the benefit of Spravato  every other week.  She is struggling with some pain due to plantar fasciitis and given her cerebral palsy that makes it even more difficult to walk and to engage in normal activities.  She is willing to seek treatment for that.  07/25/23 appt noted: Current psych meds: Wellbutrin  XL 150 mg AM and started Auvelity  1 BID, fluvoxamine  100 mg in AM and 300 mg HS, lorazepam  1 mg 1-2 mg in the AM and HS and 1 tablet prn midday for anxiety, temazepam  30 mg HS.  Didn't increase Auvelity  yet No SE Patient received Spravato  84 mg today.  She tolerated it well without any unusual headache, nausea or vomiting or other somatic symptoms.  Dissociation did occur and she gradually saw resolution over the 2-hour period of observation.  She does not typically find the dissociation very strong.  It gets rid of negative emotion for awhile after procedure . Chronic dep and anxiety to some extent.  But has increased Auvelity  to BID recently and no SE yet.  Disc frequency Spravato  bc benefit and will try to continue every other week and not less if possible. No further concerns for meds.  08/08/23 appt noted: Current psych meds: Wellbutrin  XL 150 mg AM and Auvelity  1 BID, fluvoxamine  100 mg in AM and 300 mg HS, lorazepam  1 mg 1-2 mg in the AM and HS and 1 tablet prn midday for anxiety, temazepam  30 mg HS.  Didn't increase Auvelity  yet No SE with med changes. Patient received Spravato  84 mg today.  She tolerated it well without any unusual headache, nausea or vomiting or other somatic symptoms.  Dissociation did occur and she gradually saw resolution over the 2-hour period of observation.  She does not typically find the dissociation very strong.  It gets rid  of negative emotion for awhile after procedure . Mornings still worse with dep and anxiety.  But mood and anxiety are better with the increase in Auvelity  to BID.  OCD seems a little better.  Chronic difficulty handling the complaints and stress from her family.  Easily stresser her out.  No med changes desire. She will have to take a break from Spravato  DT pending foot surgery.  Mobility and transportation will be limited.  08/25/23 appt noted: Current psych meds: Wellbutrin  XL 150 mg AM and Auvelity  1 BID, fluvoxamine  100 mg in AM and 300 mg HS, lorazepam  1 mg 1-2 mg in the AM and HS and 1 tablet prn midday for anxiety, temazepam  30 mg HS.  Didn't increase Auvelity  yet No SE with med changes. Patient received Spravato  84 mg today.  She tolerated it well  without any unusual headache, nausea or vomiting or other somatic symptoms.  Dissociation did occur and she gradually saw resolution over the 2-hour period of observation.  She does not typically find the dissociation very strong.  It gets rid of negative emotion for awhile after procedure . Pending foot surgery next week.  Able to work in Spravato  this week which helps with dep and anxiety and OCD.  Struggles with sleep without multiple meds as noted.  No adverse effects.  Will return to [facility]  asap after foot surgery  10/04/23 appt noted: Current psych meds: Wellbutrin  XL 150 mg AM and Auvelity  1 BID, fluvoxamine  100 mg in AM and 300 mg HS, lorazepam  1 mg 1-2 mg in the AM and HS and 1 tablet prn midday for anxiety, temazepam  30 mg HS.  Didn't increase Auvelity  yet No SE with med changes. Patient received Spravato  84 mg today.  She tolerated it well without any unusual headache, nausea or vomiting or other somatic symptoms.  Dissociation did occur and she gradually saw resolution over the 2-hour period of observation.  She does not typically find the dissociation very strong.  It gets rid of negative emotion for awhile after procedure . Mood and  anxiety stable.  Benefit with meds and Sprav[facility] .   She reports OCD time consumption typically is much better than in the past when it was routinely 2 hours daily.  Now less than have of that.    Chronic caregiver stress with dysfunctional family and has trouble with boundaries.  Working on that in therapy.  10/24/23 appt noted: Current psych meds: Wellbutrin  XL 150 mg AM and Auvelity  1 BID, fluvoxamine  100 mg in AM and 300 mg HS, lorazepam  1 mg 1-2 mg in the AM and HS and 1 tablet prn midday for anxiety, temazepam  30 mg HS.  Didn't increase Auvelity  yet No SE with med changes. She plans to stop [place]  bc couldn't get adequate transportation.  Her mood is continuing to be highly reactive with what's going on with family esp mother and brother.  Mother self absorbed and can be mean yet demanding.   She doesn't fear getting worse if she stops Spravato  though this has happened in the past.   Lately less dep though can be pulled down quickly by family of origin interactions.  Narcissistic mother.  Thinking of PT work to try to keep her mind more occupied.   Previous psych med trials include Prozac, paroxetine, sertraline, fluvoxamine , venlafaxine, Anafranil  with no response,  Wellbutrin , Viibryd, Trintellix 10 1 month NR Auvelity  BID   Geodon,  risperidone, Rexulti, Abilify,  Seroquel, Latuda 40 mg with irritability.   lamotrigine lithium,  BuSpar, Namenda,  pramipexole with no response, and Topamax, pindolol  2 brothers & 1 sister poverty, 1 B with untreated bipolar disorder lives with mother  ECT-MADRS    Flowsheet Row Office Visit from 06/29/2021 in [facility]  MADRS Total Score 36      Flowsheet Row Admission (Discharged) from 06/11/2021 in [facility]  C-SSRS RISK CATEGORY No Risk       Review of Systems:  Review of Systems  Constitutional:  Positive for fatigue.  Cardiovascular:  Negative for palpitations.  Musculoskeletal:   Positive for arthralgias, back pain, gait problem, myalgias and neck pain.  Neurological:  Positive for weakness and headaches. Negative for tremors.  Psychiatric/Behavioral:  Positive for dysphoric mood and sleep disturbance. Negative for suicidal ideas. The patient is nervous/anxious.     Medications:  I have reviewed the patient's current medications.  Current Outpatient Medications  Medication Sig Dispense Refill   Abaloparatide (TYMLOS) 3120 MCG/1.56ML SOPN Inject into the skin.     AUVELITY  45-105 MG TBCR TAKE 1 TABLET BY MOUTH IN THE MORNING AND AT BEDTIME 60 tablet 1   Azelastine-Fluticasone  137-50 MCG/ACT SUSP Place 1-2 sprays into both nostrils daily.     baclofen  (LIORESAL ) 10 MG tablet Take 20 mg by mouth at bedtime as needed for muscle spasms.     buPROPion  (WELLBUTRIN  XL) 150 MG 24 hr tablet Take 150 mg by mouth daily.     Dextromethorphan-buPROPion  ER (AUVELITY ) 45-105 MG TBCR Take 1 tablet by mouth in the morning and at bedtime. 60 tablet 0   dicyclomine (BENTYL) 10 MG capsule Take 10 mg by mouth daily.     docusate sodium  (COLACE) 100 MG capsule Take 1 capsule (100 mg total) by mouth 2 (two) times daily. (Patient taking differently: Take 100 mg by mouth daily.) 10 capsule 0   fexofenadine (ALLEGRA) 180 MG tablet Take 180 mg by mouth daily.     fluvoxaMINE  (LUVOX ) 100 MG tablet TAKE 1 TABLET IN THE AM AND 3 TABLETS AT NIGHT 120 tablet 2   hydrocortisone  (ANUSOL -HC) 2.5 % rectal cream Place rectally 2 (two) times daily. x 7-14 days 30 g 0   ketotifen  (ZADITOR ) 0.025 % ophthalmic solution Place 3 drops into both eyes at bedtime.     LORazepam  (ATIVAN ) 1 MG tablet TAKE 1-2 IN THE AM AND 1-2 TABLETS EVERY NIGHT AT BEDTIME AND 1 TABLET IN AFTERNOON when needed for anxiety and sleep 150 tablet 0   magnesium gluconate (MAGONATE) 500 MG tablet Take 500 mg by mouth daily.     MIBELAS 24 FE 1-20 MG-MCG(24) CHEW Chew 1 tablet by mouth at bedtime as needed (bowel regularity).     Multiple  Vitamins-Minerals (ADULT GUMMY PO) Take 2 tablets by mouth in the morning.     nitrofurantoin (MACRODANTIN) 100 MG capsule Take 100 mg by mouth as needed (For urinary tract infection.).      oxyCODONE -acetaminophen  (PERCOCET/ROXICET) 5-325 MG tablet Take 1-2 tablets by mouth every 6 (six) hours as needed for severe pain. 50 tablet 0   polyethylene glycol (MIRALAX  / GLYCOLAX ) packet Take 17 g by mouth daily as needed for mild constipation. 14 each 0   psyllium (METAMUCIL) 58.6 % powder Take 1 packet by mouth daily as needed (constipation).     SUMAtriptan  (IMITREX ) 100 MG tablet Take 1 tablet (100 mg total) by mouth every 2 (two) hours as needed for migraine. May repeat in 2 hours if headache persists or recurs. 9 tablet 2   Vitamin D-Vitamin K (VITAMIN K2-VITAMIN D3 PO) Take 1-2 sprays by mouth daily.     Esketamine HCl, 84 MG Dose, (SPRAVATO , 84 MG DOSE,) 28 MG/DEVICE SOPK USE 3 SPRAYS IN EACH NOSTRIL ONCE A WEEK (Patient not taking: Reported on 10/24/2023) 3 each 0   propranolol  ER (INDERAL  LA) 60 MG 24 hr capsule TAKE 1 CAPSULE BY MOUTH EVERY DAY 90 capsule 0   temazepam  (RESTORIL ) 30 MG capsule Take 1 capsule by mouth at bedtime as needed for sleep 30 capsule 1   No current facility-administered medications for this visit.    Medication Side Effects: None   Allergies:  Allergies  Allergen Reactions   Hydrocodone  Itching   Sulfamethoxazole-Trimethoprim Itching   Dust Mite Extract Other (See Comments)    Sneezing, watery eyes, runny nose   Latex Itching  Other Other (See Comments)    PT IS ALLERGIC TO CAT DANDER AND RAGWEED - Sneezing, watery eyes, runny nose    Pollen Extract Other (See Comments)    Sneezing, watery eyes, runny nose     Past Medical History:  Diagnosis Date   Abnormal Pap smear 2011   hpv/mild dysplasia,cin1   Anxiety    Cerebral palsy (HCC)    right arm/leg   Cystocele    Depression    Headache    Neuromuscular disorder (HCC)    Cerebral Palsy   OCD  (obsessive compulsive disorder)    Osteoporosis    Uterine prolaps     Family History  Problem Relation Age of Onset   Cancer Father        skin AND LUNG   Alcohol abuse Sister        CRACK COCAINE    Social History   Socioeconomic History   Marital status: Married    Spouse name: Not on file   Number of children: Not on file   Years of education: Not on file   Highest education level: Not on file  Occupational History   Not on file  Tobacco Use   Smoking status: Never   Smokeless tobacco: Never  Substance and Sexual Activity   Alcohol use: Not Currently    Comment: OCCASIONAL beer   Drug use: No   Sexual activity: Yes    Birth control/protection: Pill    Comment: LOESTRIN 24 FE  Other Topics Concern   Not on file  Social History Narrative   Not on file   Social Drivers of Health   Financial Resource Strain: Not on file  Food Insecurity: Not on file  Transportation Needs: Not on file  Physical Activity: Not on file  Stress: Not on file  Social Connections: Not on file  Intimate Partner Violence: Not on file    Past Medical History, Surgical history, Social history, and Family history were reviewed and updated as appropriate.   Please see review of systems for further details on the patient's review from today.   Objective:   Physical Exam:  LMP  (LMP Unknown)   Physical Exam Constitutional:      General: She is not in acute distress. Neurological:     Mental Status: She is alert and oriented to person, place, and time.     Cranial Nerves: No dysarthria.     Motor: Weakness present.     Gait: Gait abnormal.  Psychiatric:        Attention and Perception: Attention and perception normal.        Mood and Affect: Mood is anxious and depressed. Affect is tearful.        Speech: Speech normal. Speech is not slurred.        Behavior: Behavior normal. Behavior is cooperative.        Thought Content: Thought content normal. Thought content is not  delusional. Thought content does not include homicidal or suicidal ideation. Thought content does not include suicidal plan.        Cognition and Memory: Cognition and memory normal. Cognition is not impaired.        Judgment: Judgment normal.     Comments: Insight intact Ongoing OCD remains fairly severe but less anxious Checking compulsions now about 30 mins Chronic depression persistent but better than it was a year ago.       Lab Review:     Component Value Date/Time   NA  138 06/11/2021 0606   K 4.0 06/11/2021 0606   CL 107 06/11/2021 0606   CO2 26 06/11/2021 0606   GLUCOSE 90 06/11/2021 0606   BUN 18 06/11/2021 0606   CREATININE 0.81 06/11/2021 0606   CALCIUM 9.4 06/11/2021 0606   PROT 6.5 06/11/2021 0606   ALBUMIN  3.3 (L) 06/11/2021 0606   AST 17 06/11/2021 0606   ALT 14 06/11/2021 0606   ALKPHOS 141 (H) 06/11/2021 0606   BILITOT 0.2 (L) 06/11/2021 0606   GFRNONAA >60 06/11/2021 0606   GFRAA >60 07/09/2016 0438       Component Value Date/Time   WBC 5.8 06/11/2021 0606   RBC 4.12 06/11/2021 0606   HGB 12.5 06/11/2021 0606   HCT 39.7 06/11/2021 0606   PLT 299 06/11/2021 0606   MCV 96.4 06/11/2021 0606   MCH 30.3 06/11/2021 0606   MCHC 31.5 06/11/2021 0606   RDW 13.9 06/11/2021 0606   LYMPHSABS 1.9 06/11/2021 0606   MONOABS 0.5 06/11/2021 0606   EOSABS 0.1 06/11/2021 0606   BASOSABS 0.0 06/11/2021 0606    No results found for: POCLITH, LITHIUM   No results found for: PHENYTOIN, PHENOBARB, VALPROATE, CBMZ   .res Assessment: Plan:    Alydia Gosser was seen today for follow-up, depression, anxiety and stress.  Diagnoses and all orders for this visit:  Recurrent major depression resistant to treatment [facility])  Mixed obsessional thoughts and acts  Social anxiety disorder  Caregiver stress  Insomnia due to mental condition  Migraine without aura and without status migrainosus, not intractable  Relationship problem with family  members   Both primary Dx of OCD and major depression are TR and marked.  Impaired function but less so with Spravato  re: depression..   She has been receiving Spravato  84 mg weekly and moderate improvement in the depression..  she feels it also helps OCD somewhat.  However still easily overwhelmed with low stress tolerance.  Family contributes to her anxiety and stress markedly.  The OCD is improved with the increase in fluvoxamine  and with Spravato .  Spends up to 2 hours daily and checking compulsions on her worst days but better when she travels.  No new options for tx are evident.  She has been on higher doses of fluvoxamine  above the usual max of 400 mg daily in the past and finds it more helpful at the higher dose.    Disc SE.   She is tolerating the meds well  Continue  Luvox  back to 400 mg nightly as of January 2023. Disc dosing higher than usual.  She feels this is increase has helped more with OCD which remains chronically severe.  Continue Auvelity  BID and reduced Wellbutrin  to 1 AM. It has helped and is tolerated.   We have discussed seizure risk that is possible using this combination but given severity of her symptoms she feels the risk is warranted. There are few other alternative medication options that remain.   Disc DDI issues.   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 She doesn't want to try this now.  We discussed the short-term risks associated with benzodiazepines including sedation and increased fall risk among others.  Discussed long-term side effect risk including dependence, potential withdrawal symptoms, and the potential eventual dose-related risk of dementia.  But recent studies from 2020 dispute this association between benzodiazepines and dementia risk. Newer studies in 2020 do not support an association with dementia. Disc this is high dose and not ideal.  Also disc risk combining it with temazepam . Rec try gradually reduce HS  lorazepam  to 1 mg Hs.  Can continue lorazepam  2 mg AM and 1 mg in afternoon bc of chronic anxiety and it is helpful and tolerated. She can continue temazepam  30 mg nightly.  She tends to have a lot of anxious negative thoughts at night when she is trying to go to bed.  She is trying to reduce the dose.  Complaining of HA and history migraine.  Asks for increase imitrex  and disc preventatives like propranolol  ER imitrex  to 100 mg prn migraine and propranolol  ER for migraine prevention.  Counseling 30 min on how strongly her family's behavior affects her mood.  Looked at some reasons for this and techniques to address her mood and response.  Disc mother and Brothers in particular Disc trasportation problems limiting access to Spravato . Also working on trying to develop social and spiritual goals.  No med changes except hold Spravato  if possible  continue Auvelity  1 in AM & PM continue Wellbutrin  XL 150 mg AM Fluvoxamine  100 mg AM and 300 mg PM above usual max bc med necessary Lorazepam  2 mg HS and prn 1 mg BID prn anxiety daily Temazepam  30 mg HS Propranolol  ER 60 mg daily Imitrex  prn migraine  She wants to hold Spravato  for now bc transportation problems. FU 2-3 mos   Lorene Macintosh, MD, DFAPA  Please see After Visit Summary for patient specific instructions.  Future Appointments  Date Time Provider Department Center  11/17/2023 11:00 AM Marijean Charleston, PhD [facility] None  12/07/2023  1:00 PM Marijean Charleston, PhD [facility] None  12/12/2023 11:00 AM Marijean Charleston, PhD [facility] None  12/22/2023  4:00 PM Marijean Charleston, PhD [facility] None  12/28/2023  1:00 PM Marijean Charleston, PhD [facility] None  01/11/2024  1:00 PM Marijean Charleston, PhD [facility] None  01/23/2024 11:00 AM Cottle, Lorene KANDICE Raddle., MD [facility] None  01/25/2024  1:00 PM Marijean Charleston, PhD [facility] None    No orders of the defined types were placed in this encounter.    -------------------------------

## 2023-10-26 ENCOUNTER — Other Ambulatory Visit: Payer: Self-pay | Admitting: Psychiatry

## 2023-10-26 DIAGNOSIS — G43009 Migraine without aura, not intractable, without status migrainosus: Secondary | ICD-10-CM

## 2023-10-31 ENCOUNTER — Ambulatory Visit: Payer: 59 | Admitting: Psychiatry

## 2023-11-02 ENCOUNTER — Telehealth: Payer: Self-pay | Admitting: Psychiatry

## 2023-11-02 NOTE — Telephone Encounter (Signed)
Lf 12/29 due 1/26

## 2023-11-02 NOTE — Telephone Encounter (Signed)
Pt called and said that she needs a refill on her temazepam 30 mg. Pharmacy is [facility] at [address]

## 2023-11-04 ENCOUNTER — Other Ambulatory Visit: Payer: Self-pay | Admitting: Physician Assistant

## 2023-11-04 DIAGNOSIS — F5105 Insomnia due to other mental disorder: Secondary | ICD-10-CM

## 2023-11-05 ENCOUNTER — Other Ambulatory Visit: Payer: Self-pay | Admitting: Psychiatry

## 2023-11-05 DIAGNOSIS — F422 Mixed obsessional thoughts and acts: Secondary | ICD-10-CM

## 2023-11-05 DIAGNOSIS — F339 Major depressive disorder, recurrent, unspecified: Secondary | ICD-10-CM

## 2023-11-06 ENCOUNTER — Telehealth: Payer: Self-pay | Admitting: Psychiatry

## 2023-11-06 ENCOUNTER — Other Ambulatory Visit: Payer: Self-pay

## 2023-11-06 DIAGNOSIS — F5105 Insomnia due to other mental disorder: Secondary | ICD-10-CM

## 2023-11-06 MED ORDER — TEMAZEPAM 30 MG PO CAPS: ORAL_CAPSULE | ORAL | 1 refills | Status: DC

## 2023-11-06 NOTE — Telephone Encounter (Signed)
LF Temazepam 12/29 not due until 1/26

## 2023-11-06 NOTE — Telephone Encounter (Signed)
Lvm to rc. Pended Temazepam. Lf 12/29 for 15 day supply.

## 2023-11-06 NOTE — Telephone Encounter (Signed)
Re: fluvoxamine she is on higher than usual dose of fluvoxamine at 400 mg daily, medically necessary and tolerated for severe OCD. May need new pa if denied.

## 2023-11-06 NOTE — Telephone Encounter (Signed)
On 1/16, pt called to Request RF on Temazepam. Not sure if this was pending for [name] but was not filled. Pt has been out over the weekend. Please RF Temazepam.

## 2023-11-06 NOTE — Telephone Encounter (Signed)
Casey Diaz called again at 10:36 and LM again about the Temazepam.  You show it isn't due until 1/26. But since she is out can an early refill be submitted?  She also said insurance isn't want to cover he fluvozmine.  I see you are already aware of the PA.  Please advise Casey Diaz of the PA and whether or not an early will be authorized for her Temazepam.

## 2023-11-06 NOTE — Telephone Encounter (Signed)
DUE 1/26

## 2023-11-07 NOTE — Telephone Encounter (Signed)
Contacted [facility] at [phone] for quantity prior authorization on Temazepam 30 mg #30 for 30 day, prior approval received effective 10/08/23-11/12/23, PA# [id number] Pt's next fill date would be 11/17/23 since she filled yesterday 11/06/23 #15.  Prior Authorization for Lorazepam 1 mg #150 for 30 day also completed over the phone, approval received effective 10/08/23-11/06/24, PA# [id number]  Again no new PA needed for Fluvoxamine 100 mg #120/30 day still in effect until 05/29/24 Pharmacy just needs to do an override for her quantity, they can contact [facility] help desk for instructions if needed at [phone] per representative

## 2023-11-07 NOTE — Telephone Encounter (Signed)
Pt already has a PA on file through 05/29/2024 for Fluvoxamine 100 mg #120 for 30 days. I do not see changes in her insurance.   I will contact [facility] about doing a PA for #30 Temazepam because insurance only covers #15.

## 2023-11-07 NOTE — Telephone Encounter (Signed)
Called the pharmacy, and let them to know they needed to override the Fluvoxamine 100 mg rx. She said that she would reprocess it.

## 2023-11-07 NOTE — Telephone Encounter (Signed)
 See phone message

## 2023-11-08 ENCOUNTER — Other Ambulatory Visit: Payer: Self-pay | Admitting: Psychiatry

## 2023-11-08 DIAGNOSIS — G43009 Migraine without aura, not intractable, without status migrainosus: Secondary | ICD-10-CM

## 2023-11-15 ENCOUNTER — Ambulatory Visit (INDEPENDENT_AMBULATORY_CARE_PROVIDER_SITE_OTHER): Payer: 59

## 2023-11-15 ENCOUNTER — Encounter: Payer: Self-pay | Admitting: Podiatry

## 2023-11-15 ENCOUNTER — Ambulatory Visit (INDEPENDENT_AMBULATORY_CARE_PROVIDER_SITE_OTHER): Payer: 59 | Admitting: Podiatry

## 2023-11-15 DIAGNOSIS — Q666 Other congenital valgus deformities of feet: Secondary | ICD-10-CM

## 2023-11-15 DIAGNOSIS — M778 Other enthesopathies, not elsewhere classified: Secondary | ICD-10-CM | POA: Diagnosis not present

## 2023-11-15 DIAGNOSIS — G809 Cerebral palsy, unspecified: Secondary | ICD-10-CM | POA: Diagnosis not present

## 2023-11-15 NOTE — Progress Notes (Signed)
Subjective:  Patient ID: Casey Diaz, female    DOB: 1965/11/08,  MRN: [id number]  Chief Complaint  Patient presents with   Foot Pain    RM#11 Right foot pain injury to foot 2 weeks ago throbbing pain only relief is when in rest.    58 y.o. female presents with the above complaint.  Patient presents with pain to the right first tarsometatarsal joint painful to touch is progressive gotten worse hurts with ambulation worse with pressure she states that she had injury to the right foot there is some throbbing pain is not acute pain.  She denies any other acute complaints.  She has a history of cerebral palsy.  She is currently wearing bracing for it.   Review of Systems: Negative except as noted in the HPI. Denies N/V/F/Ch.  Past Medical History:  Diagnosis Date   Abnormal Pap smear 2011   hpv/mild dysplasia,cin1   Anxiety    Cerebral palsy (HCC)    right arm/leg   Cystocele    Depression    Headache    Neuromuscular disorder (HCC)    Cerebral Palsy   OCD (obsessive compulsive disorder)    Osteoporosis    Uterine prolaps     Current Outpatient Medications:    Abaloparatide (TYMLOS) 3120 MCG/1.56ML SOPN, Inject into the skin., Disp: , Rfl:    AUVELITY 45-105 MG TBCR, TAKE 1 TABLET BY MOUTH IN THE MORNING AND AT BEDTIME, Disp: 60 tablet, Rfl: 1   Azelastine-Fluticasone 137-50 MCG/ACT SUSP, Place 1-2 sprays into both nostrils daily., Disp: , Rfl:    baclofen (LIORESAL) 10 MG tablet, Take 20 mg by mouth at bedtime as needed for muscle spasms., Disp: , Rfl:    buPROPion (WELLBUTRIN XL) 150 MG 24 hr tablet, Take 150 mg by mouth daily., Disp: , Rfl:    Dextromethorphan-buPROPion ER (AUVELITY) 45-105 MG TBCR, Take 1 tablet by mouth in the morning and at bedtime., Disp: 60 tablet, Rfl: 0   dicyclomine (BENTYL) 10 MG capsule, Take 10 mg by mouth daily., Disp: , Rfl:    docusate sodium (COLACE) 100 MG capsule, Take 1 capsule (100 mg total) by mouth 2 (two) times daily. (Patient taking  differently: Take 100 mg by mouth daily.), Disp: 10 capsule, Rfl: 0   Esketamine HCl, 84 MG Dose, (SPRAVATO, 84 MG DOSE,) 28 MG/DEVICE SOPK, USE 3 SPRAYS IN EACH NOSTRIL ONCE A WEEK, Disp: 3 each, Rfl: 0   fexofenadine (ALLEGRA) 180 MG tablet, Take 180 mg by mouth daily., Disp: , Rfl:    fluvoxaMINE (LUVOX) 100 MG tablet, TAKE 1 TABLET IN THE AM AND 3 TABLETS AT NIGHT, Disp: 120 tablet, Rfl: 2   hydrocortisone (ANUSOL-HC) 2.5 % rectal cream, Place rectally 2 (two) times daily. x 7-14 days, Disp: 30 g, Rfl: 0   ketotifen (ZADITOR) 0.025 % ophthalmic solution, Place 3 drops into both eyes at bedtime., Disp: , Rfl:    LORazepam (ATIVAN) 1 MG tablet, TAKE 1-2 IN THE AM AND 1-2 TABLETS EVERY NIGHT AT BEDTIME AND 1 TABLET IN AFTERNOON when needed for anxiety and sleep, Disp: 150 tablet, Rfl: 0   magnesium gluconate (MAGONATE) 500 MG tablet, Take 500 mg by mouth daily., Disp: , Rfl:    MIBELAS 24 FE 1-20 MG-MCG(24) CHEW, Chew 1 tablet by mouth at bedtime as needed (bowel regularity)., Disp: , Rfl:    Multiple Vitamins-Minerals (ADULT GUMMY PO), Take 2 tablets by mouth in the morning., Disp: , Rfl:    nitrofurantoin (MACRODANTIN) 100 MG capsule,  Take 100 mg by mouth as needed (For urinary tract infection.). , Disp: , Rfl:    oxyCODONE-acetaminophen (PERCOCET/ROXICET) 5-325 MG tablet, Take 1-2 tablets by mouth every 6 (six) hours as needed for severe pain., Disp: 50 tablet, Rfl: 0   polyethylene glycol (MIRALAX / GLYCOLAX) packet, Take 17 g by mouth daily as needed for mild constipation., Disp: 14 each, Rfl: 0   propranolol ER (INDERAL LA) 60 MG 24 hr capsule, TAKE 1 CAPSULE BY MOUTH EVERY DAY, Disp: 90 capsule, Rfl: 0   psyllium (METAMUCIL) 58.6 % powder, Take 1 packet by mouth daily as needed (constipation)., Disp: , Rfl:    SUMAtriptan (IMITREX) 100 MG tablet, Take 1 tablet (100 mg total) by mouth every 2 (two) hours as needed for migraine. May repeat in 2 hours if headache persists or recurs., Disp: 10  tablet, Rfl: 2   temazepam (RESTORIL) 30 MG capsule, Take 1 capsule by mouth at bedtime as needed for sleep, Disp: 30 capsule, Rfl: 1   Vitamin D-Vitamin K (VITAMIN K2-VITAMIN D3 PO), Take 1-2 sprays by mouth daily., Disp: , Rfl:   Social History   Tobacco Use  Smoking Status Never  Smokeless Tobacco Never    Allergies  Allergen Reactions   Hydrocodone Itching   Sulfamethoxazole-Trimethoprim Itching   Dust Mite Extract Other (See Comments)    Sneezing, watery eyes, runny nose   Latex Itching   Other Other (See Comments)    PT IS ALLERGIC TO CAT DANDER AND RAGWEED - Sneezing, watery eyes, runny nose    Pollen Extract Other (See Comments)    Sneezing, watery eyes, runny nose    Objective:  There were no vitals filed for this visit. There is no height or weight on file to calculate BMI. Constitutional Well developed. Well nourished.  Vascular Dorsalis pedis pulses palpable bilaterally. Posterior tibial pulses palpable bilaterally. Capillary refill normal to all digits.  No cyanosis or clubbing noted. Pedal hair growth normal.  Neurologic Normal speech. Oriented to person, place, and time. Epicritic sensation to light touch grossly present bilaterally.  Dermatologic Nails well groomed and normal in appearance. No open wounds. No skin lesions.  Orthopedic: Right first tarsometatarsal joint pain.  Pain with range of motion of the joint no pain at the first metatarsophalangeal joint.  No pain at the midfoot.  No extensor tendinitis noted pes planovalgus foot structure noted   Radiographs: 3 views of skeletally mature the right foot: No fractures noted no bony abnormalities identified history of fifth metatarsal base fracture noted which appears to be more consolidated or chronic fracture. Assessment:   1. Capsulitis of right foot   2. 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HCC)   3. Pes planovalgus    Plan:  Patient was evaluated and treated and all questions answered.  Right first  tarsometatarsal joint capsulitis -All questions and concerns were discussed with the patient in extensive detail given the amount of pain that she is having she will benefit from steroid injection to help decrease acute inflammatory component associate with pain.  Patient agrees with plan would like to proceed with steroid injection -A steroid injection was performed at right first tarsometatarsal joint using 1% plain Lidocaine and 10 mg of Kenalog. This was well tolerated.  Pes planovalgus with underlying history of cerebral palsy -I explained to patient the etiology of pes planovalgus and relationship with Planter fasciitis and various treatment options were discussed.  Given patient foot structure in the setting of Planter fasciitis I believe patient will benefit from  custom-made orthotics to help control the hindfoot motion support the arch of the foot and take the stress away from plantar fascial. -Patient is currently wearing good feet orthotics if there is no improvement we will plan on doing custom orthotics

## 2023-11-17 ENCOUNTER — Other Ambulatory Visit: Payer: Self-pay | Admitting: Psychiatry

## 2023-11-17 ENCOUNTER — Ambulatory Visit (INDEPENDENT_AMBULATORY_CARE_PROVIDER_SITE_OTHER): Payer: 59 | Admitting: Psychiatry

## 2023-11-17 DIAGNOSIS — Z636 Dependent relative needing care at home: Secondary | ICD-10-CM

## 2023-11-17 DIAGNOSIS — F422 Mixed obsessional thoughts and acts: Secondary | ICD-10-CM | POA: Diagnosis not present

## 2023-11-17 DIAGNOSIS — Z638 Other specified problems related to primary support group: Secondary | ICD-10-CM

## 2023-11-17 DIAGNOSIS — F4010 Social phobia, unspecified: Secondary | ICD-10-CM

## 2023-11-17 DIAGNOSIS — F339 Major depressive disorder, recurrent, unspecified: Secondary | ICD-10-CM

## 2023-11-17 DIAGNOSIS — F509 Eating disorder, unspecified: Secondary | ICD-10-CM
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
## 2023-11-19 ENCOUNTER — Telehealth: Payer: Self-pay

## 2023-11-19 NOTE — Telephone Encounter (Signed)
Prior Authorization Lorazepam 1 mg #150/30 [facility]  Approved Effective:  10/08/23-11/06/24

## 2023-11-21 ENCOUNTER — Telehealth: Payer: Self-pay | Admitting: Podiatry

## 2023-11-21 MED ORDER — MELOXICAM 15 MG PO TABS: 15.0000 mg | ORAL_TABLET | Freq: Every day | ORAL | 0 refills | Status: AC

## 2023-11-21 NOTE — Telephone Encounter (Signed)
Spoke to pt and let her know that mobic was sent to the pharmacy for her to try. She then asked since she has been exercising would it benefit her to tape her foot. I transferred to the nurse.

## 2023-11-21 NOTE — Telephone Encounter (Signed)
 Pt called and thinks she has aggravated the tender area you guys discussed and is asking if you could send in a prescription for an anti inflammatory medication to help. [facility]  [address] [facility]. She said if you needed to speak to her you could call her.

## 2023-11-21 NOTE — Telephone Encounter (Signed)
Patient is experiencing severe pain on top of right foot. Patient would like to speak with a nurse to get guidance on what to do. Patient contact telephone number; [phone]

## 2023-11-27 ENCOUNTER — Other Ambulatory Visit: Payer: Self-pay | Admitting: Psychiatry

## 2023-11-27 DIAGNOSIS — F4010 Social phobia, unspecified: Secondary | ICD-10-CM

## 2023-11-28 ENCOUNTER — Ambulatory Visit: Payer: 59 | Admitting: Psychiatry

## 2023-12-05 ENCOUNTER — Telehealth: Payer: Self-pay | Admitting: Podiatry

## 2023-12-05 ENCOUNTER — Other Ambulatory Visit: Payer: Self-pay | Admitting: Podiatry

## 2023-12-05 ENCOUNTER — Ambulatory Visit (INDEPENDENT_AMBULATORY_CARE_PROVIDER_SITE_OTHER): Payer: 59 | Admitting: Podiatry

## 2023-12-05 ENCOUNTER — Ambulatory Visit (INDEPENDENT_AMBULATORY_CARE_PROVIDER_SITE_OTHER): Payer: 59

## 2023-12-05 DIAGNOSIS — M778 Other enthesopathies, not elsewhere classified: Secondary | ICD-10-CM | POA: Diagnosis not present

## 2023-12-05 DIAGNOSIS — M62461 Contracture of muscle, right lower leg: Secondary | ICD-10-CM | POA: Diagnosis not present

## 2023-12-05 DIAGNOSIS — M722 Plantar fascial fibromatosis: Secondary | ICD-10-CM

## 2023-12-05 NOTE — Progress Notes (Signed)
 Subjective:  Patient ID: Casey Diaz, female    DOB: 25-Jun-1966,  MRN: [id number]  Chief Complaint  Patient presents with   Foot Pain    Pt stated that she fell today on her right side and is having pain in her right foot/ankle and it is going up the leg     58 y.o. female presents with the above complaint.  Patient presents with right plantar fasciitis pain hurts with ambulation worse with pressure.  She states that is going up the leg has progressive gotten worse pain scale 7 out of 10 dull aching nature has not seen and was prior to seeing me denies any other acute complaints.  Prior to taking for step to the body.   Review of Systems: Negative except as noted in the HPI. Denies N/V/F/Ch.  Past Medical History:  Diagnosis Date   Abnormal Pap smear 2011   hpv/mild dysplasia,cin1   Anxiety    Cerebral palsy (HCC)    right arm/leg   Cystocele    Depression    Headache    Neuromuscular disorder (HCC)    Cerebral Palsy   OCD (obsessive compulsive disorder)    Osteoporosis    Uterine prolaps     Current Outpatient Medications:    meloxicam (MOBIC) 15 MG tablet, Take 1 tablet (15 mg total) by mouth daily., Disp: 30 tablet, Rfl: 0   methylPREDNISolone (MEDROL DOSEPAK) 4 MG TBPK tablet, Take as directed, Disp: 21 each, Rfl: 0   Abaloparatide (TYMLOS) 3120 MCG/1.56ML SOPN, Inject into the skin., Disp: , Rfl:    AUVELITY 45-105 MG TBCR, TAKE 1 TABLET BY MOUTH IN THE MORNING AND AT BEDTIME, Disp: 60 tablet, Rfl: 1   AUVELITY 45-105 MG TBCR, TAKE 1 TABLET BY MOUTH IN THE MORNING AND IN THE EVENING, Disp: 60 tablet, Rfl: 0   Azelastine-Fluticasone 137-50 MCG/ACT SUSP, Place 1-2 sprays into both nostrils daily., Disp: , Rfl:    baclofen (LIORESAL) 10 MG tablet, Take 20 mg by mouth at bedtime as needed for muscle spasms., Disp: , Rfl:    buPROPion (WELLBUTRIN XL) 150 MG 24 hr tablet, Take 150 mg by mouth daily., Disp: , Rfl:    dicyclomine (BENTYL) 10 MG capsule, Take 10 mg by mouth  daily., Disp: , Rfl:    docusate sodium (COLACE) 100 MG capsule, Take 1 capsule (100 mg total) by mouth 2 (two) times daily. (Patient taking differently: Take 100 mg by mouth daily.), Disp: 10 capsule, Rfl: 0   Esketamine HCl, 84 MG Dose, (SPRAVATO, 84 MG DOSE,) 28 MG/DEVICE SOPK, USE 3 SPRAYS IN EACH NOSTRIL ONCE A WEEK, Disp: 3 each, Rfl: 0   fexofenadine (ALLEGRA) 180 MG tablet, Take 180 mg by mouth daily., Disp: , Rfl:    fluvoxaMINE (LUVOX) 100 MG tablet, TAKE 1 TABLET IN THE AM AND 3 TABLETS AT NIGHT, Disp: 120 tablet, Rfl: 2   hydrocortisone (ANUSOL-HC) 2.5 % rectal cream, Place rectally 2 (two) times daily. x 7-14 days, Disp: 30 g, Rfl: 0   ketotifen (ZADITOR) 0.025 % ophthalmic solution, Place 3 drops into both eyes at bedtime., Disp: , Rfl:    LORazepam (ATIVAN) 1 MG tablet, TAKE 1-2 IN AM,1-2 TABS EVERY NIGHT AT BEDTIME AND 1 TAB IN AFTERNOON WHEN NEEDED FOR ANXIETY/SLEEP, Disp: 150 tablet, Rfl: 1   magnesium gluconate (MAGONATE) 500 MG tablet, Take 500 mg by mouth daily., Disp: , Rfl:    meloxicam (MOBIC) 15 MG tablet, Take 1 tablet (15 mg total) by mouth daily.,  Disp: 30 tablet, Rfl: 0   MIBELAS 24 FE 1-20 MG-MCG(24) CHEW, Chew 1 tablet by mouth at bedtime as needed (bowel regularity)., Disp: , Rfl:    Multiple Vitamins-Minerals (ADULT GUMMY PO), Take 2 tablets by mouth in the morning., Disp: , Rfl:    nitrofurantoin (MACRODANTIN) 100 MG capsule, Take 100 mg by mouth as needed (For urinary tract infection.). , Disp: , Rfl:    oxyCODONE-acetaminophen (PERCOCET/ROXICET) 5-325 MG tablet, Take 1-2 tablets by mouth every 6 (six) hours as needed for severe pain., Disp: 50 tablet, Rfl: 0   polyethylene glycol (MIRALAX / GLYCOLAX) packet, Take 17 g by mouth daily as needed for mild constipation., Disp: 14 each, Rfl: 0   propranolol ER (INDERAL LA) 60 MG 24 hr capsule, TAKE 1 CAPSULE BY MOUTH EVERY DAY, Disp: 90 capsule, Rfl: 0   psyllium (METAMUCIL) 58.6 % powder, Take 1 packet by mouth daily as  needed (constipation)., Disp: , Rfl:    SUMAtriptan (IMITREX) 100 MG tablet, Take 1 tablet (100 mg total) by mouth every 2 (two) hours as needed for migraine. May repeat in 2 hours if headache persists or recurs., Disp: 10 tablet, Rfl: 2   temazepam (RESTORIL) 30 MG capsule, Take 1 capsule by mouth at bedtime as needed for sleep, Disp: 30 capsule, Rfl: 1   Vitamin D-Vitamin K (VITAMIN K2-VITAMIN D3 PO), Take 1-2 sprays by mouth daily., Disp: , Rfl:   Social History   Tobacco Use  Smoking Status Never  Smokeless Tobacco Never    Allergies  Allergen Reactions   Hydrocodone Itching   Sulfamethoxazole-Trimethoprim Itching   Dust Mite Extract Other (See Comments)    Sneezing, watery eyes, runny nose   Latex Itching   Other Other (See Comments)    PT IS ALLERGIC TO CAT DANDER AND RAGWEED - Sneezing, watery eyes, runny nose    Pollen Extract Other (See Comments)    Sneezing, watery eyes, runny nose    Objective:  There were no vitals filed for this visit. There is no height or weight on file to calculate BMI. Constitutional Well developed. Well nourished.  Vascular Dorsalis pedis pulses palpable bilaterally. Posterior tibial pulses palpable bilaterally. Capillary refill normal to all digits.  No cyanosis or clubbing noted. Pedal hair growth normal.  Neurologic Normal speech. Oriented to person, place, and time. Epicritic sensation to light touch grossly present bilaterally.  Dermatologic Nails well groomed and normal in appearance. No open wounds. No skin lesions.  Orthopedic: Normal joint ROM without pain or crepitus bilaterally. No visible deformities. Tender to palpation at the calcaneal tuber right. No pain with calcaneal squeeze right. Ankle ROM diminished range of motion right. Silfverskiold Test: positive right.   Radiographs: Taken and reviewed. No acute fractures or dislocations. No evidence of stress fracture.  Plantar heel spur absent. Posterior heel spur absent.   Pes cavus foot structure  Assessment:   1. Plantar fasciitis of right foot   2. Gastrocnemius equinus, right    Plan:  Patient was evaluated and treated and all questions answered.  Plantar Fasciitis, right with underlying gastrocnemius equinus - XR reviewed as above.  - Educated on icing and stretching. Instructions given.  - Injection delivered to the plantar fascia as below. - DME: Plantar fascial brace dispensed to support the medial longitudinal arch of the foot and offload pressure from the heel and prevent arch collapse during weightbearing - Pharmacologic management: None  Procedure: Injection Tendon/Ligament Location: Right plantar fascia at the glabrous junction; medial approach. Skin  Prep: alcohol Injectate: 0.5 cc 0.5% marcaine plain, 0.5 cc of 1% Lidocaine, 0.5 cc kenalog 10. Disposition: Patient tolerated procedure well. Injection site dressed with a band-aid.  No follow-ups on file.

## 2023-12-05 NOTE — Telephone Encounter (Signed)
Patient is stating she is in severe pain and unable to put pressure on right foot. Please contact patient, [phone]

## 2023-12-06 ENCOUNTER — Telehealth: Payer: Self-pay | Admitting: Podiatry

## 2023-12-06 MED ORDER — METHYLPREDNISOLONE 4 MG PO TBPK: ORAL_TABLET | ORAL | 0 refills | Status: DC

## 2023-12-06 MED ORDER — MELOXICAM 15 MG PO TABS: 15.0000 mg | ORAL_TABLET | Freq: Every day | ORAL | 0 refills | Status: DC

## 2023-12-06 NOTE — Telephone Encounter (Signed)
Was seen yesterday and since then the pain has increased and having spans in lower calf and unable to get comfortable. R-foot and ankle now swollen Needs advise on what to do and if she needs anything.take for the pain.

## 2023-12-07 ENCOUNTER — Ambulatory Visit: Payer: 59 | Admitting: Psychiatry

## 2023-12-12 ENCOUNTER — Ambulatory Visit: Payer: 59 | Admitting: Psychiatry

## 2023-12-22 ENCOUNTER — Telehealth: Payer: Self-pay | Admitting: Psychiatry

## 2023-12-22 ENCOUNTER — Other Ambulatory Visit: Payer: Self-pay | Admitting: Psychiatry

## 2023-12-22 ENCOUNTER — Ambulatory Visit: Payer: 59 | Admitting: Psychiatry

## 2023-12-22 DIAGNOSIS — F5105 Insomnia due to other mental disorder: Secondary | ICD-10-CM

## 2023-12-22 DIAGNOSIS — F339 Major depressive disorder, recurrent, unspecified: Secondary | ICD-10-CM

## 2023-12-22 MED ORDER — TEMAZEPAM 30 MG PO CAPS: ORAL_CAPSULE | ORAL | 0 refills | Status: DC

## 2023-12-22 MED ORDER — BUPROPION HCL ER (XL) 150 MG PO TB24: 150.0000 mg | ORAL_TABLET | Freq: Every morning | ORAL | 0 refills | Status: DC

## 2023-12-22 NOTE — Telephone Encounter (Signed)
 Pt called asking for a refill on her wellbutrin xl 150 mg. [facility] is [facility] [address]. She also is only getting 15 pills of her temazepam 30 mg instead of 30 pills.

## 2023-12-22 NOTE — Telephone Encounter (Signed)
 RF sent for Wellbutrin. Called pharmacy and they said one month they only had a 15-day supply available and that RF requests automatically kept 15 days versus 30. New Rx needs to be sent. Pended.

## 2023-12-22 NOTE — Telephone Encounter (Signed)
 Pt called and said that she would like to start back on spravato

## 2023-12-26 NOTE — Telephone Encounter (Signed)
 Left pt message to call back to get scheduled for Spravato.   Contacted [facility] and they were able to run her Rx for Spravato and they can deliver tomorrow.

## 2023-12-27 ENCOUNTER — Other Ambulatory Visit: Payer: Self-pay | Admitting: Podiatry

## 2023-12-27 ENCOUNTER — Other Ambulatory Visit: Payer: Self-pay | Admitting: Psychiatry

## 2023-12-27 NOTE — Telephone Encounter (Signed)
 Left pt another message to return my call to set up her Spravato treatment

## 2023-12-28 ENCOUNTER — Ambulatory Visit (INDEPENDENT_AMBULATORY_CARE_PROVIDER_SITE_OTHER): Payer: 59 | Admitting: Psychiatry

## 2023-12-28 DIAGNOSIS — Z638 Other specified problems related to primary support group: Secondary | ICD-10-CM | POA: Diagnosis not present

## 2023-12-28 DIAGNOSIS — F422 Mixed obsessional thoughts and acts: Secondary | ICD-10-CM

## 2023-12-28 DIAGNOSIS — F4010 Social phobia, unspecified: Secondary | ICD-10-CM

## 2023-12-28 DIAGNOSIS — F339 Major depressive disorder, recurrent, unspecified: Secondary | ICD-10-CM
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
## 2023-12-31 NOTE — Telephone Encounter (Signed)
 Spoke with pt today and she is trying to work on getting a ride, she is also aware if needed she can use [address] on the way if that works better for her. She reports having a fall about 2 weeks ago and is trying to heal from that as well. She states she will contact me in 2 weeks or so to get something set up.

## 2023-12-31 NOTE — Telephone Encounter (Signed)
 Sent pt a text message instead since it may be easier to communicate with her that way to schedule a Spravato Treatment.

## 2024-01-01 NOTE — Telephone Encounter (Signed)
 Noted thanks

## 2024-01-03 ENCOUNTER — Ambulatory Visit: Payer: 59 | Admitting: Podiatry

## 2024-01-09 ENCOUNTER — Ambulatory Visit

## 2024-01-09 ENCOUNTER — Ambulatory Visit (INDEPENDENT_AMBULATORY_CARE_PROVIDER_SITE_OTHER): Admitting: Psychiatry

## 2024-01-09 VITALS — BP 117/90 | HR 86

## 2024-01-09 DIAGNOSIS — F4010 Social phobia, unspecified: Secondary | ICD-10-CM | POA: Diagnosis not present

## 2024-01-09 DIAGNOSIS — F339 Major depressive disorder, recurrent, unspecified: Secondary | ICD-10-CM

## 2024-01-09 DIAGNOSIS — F422 Mixed obsessional thoughts and acts: Secondary | ICD-10-CM | POA: Diagnosis not present

## 2024-01-09 DIAGNOSIS — G43009 Migraine without aura, not intractable, without status migrainosus: Secondary | ICD-10-CM

## 2024-01-09 DIAGNOSIS — F5105 Insomnia due to other mental disorder: Secondary | ICD-10-CM | POA: Diagnosis not present

## 2024-01-09 NOTE — Progress Notes (Signed)
 NURSE NOTE:   Pt's last Spravato Treatment was 10/03/23, she is restarting after having surgery and a recent fall. She started Spravato treatments on 10/07/2021, she continues with 84 mg (3 of the 28 mg) Spravato nasal spray for treatment resistance depression. Pt is being treated for Treatment Resistant Depression, Pt taken to treatment room. Pt's medication is charged through [employer].  Medication is stored behind 2 locked doors, it is never given to the pt until time of administration which is observed by the nurse. Disposed of per FDA/REMS regulations. All Spravato Treatments are documented in Spravato REMS per protocol of being a treatment center. Spravato is a CIII medication and has to be only given at a treatment facility and observed by nurse as pt administered intranasally   Pt's mobility is a bit slower than usual since her fall. She was directed to the treatment room to get vitals taken first. Initial vital signs are B/P at 3:25 PM 111/85, 102, SpO2 96%. Pt instructed to blow her nose and to recline back at 45 degrees. Pt given first nasal spray (28 mg) administered by pt observed by nurse. There were 5 minutes between each dose, total of 84 mg. Tolerated well. Assessed pt's 40 minute vital signs at 3:55 PM 106/73, 6, SpO2 96%.  Pt met with [name] and they discussed her care at the end of her treatment when her thoughts are clearer and her medication and moods. She does go to the bathroom at least once during her treatment. Dissociation resolved prior to discharge.  Discharge vitals at 5:20 PM 117/90, 86, SpO2 95%. Pt stable for discharge.  Pt was observed on site a total of 120 minutes per FDA/REMS requirements. Pt was with nurse for clinical assessment 50 minutes.      LOT [id number] EXP MAY 2027

## 2024-01-10 ENCOUNTER — Other Ambulatory Visit: Payer: Self-pay | Admitting: Psychiatry

## 2024-01-10 DIAGNOSIS — F339 Major depressive disorder, recurrent, unspecified: Secondary | ICD-10-CM

## 2024-01-10 DIAGNOSIS — F422 Mixed obsessional thoughts and acts: Secondary | ICD-10-CM

## 2024-01-11 ENCOUNTER — Ambulatory Visit (INDEPENDENT_AMBULATORY_CARE_PROVIDER_SITE_OTHER): Payer: 59 | Admitting: Psychiatry

## 2024-01-11 DIAGNOSIS — F422 Mixed obsessional thoughts and acts: Secondary | ICD-10-CM | POA: Diagnosis not present

## 2024-01-11 DIAGNOSIS — Z638 Other specified problems related to primary support group: Secondary | ICD-10-CM | POA: Diagnosis not present

## 2024-01-11 DIAGNOSIS — G809 Cerebral palsy, unspecified: Secondary | ICD-10-CM

## 2024-01-11 DIAGNOSIS — F339 Major depressive disorder, recurrent, unspecified: Secondary | ICD-10-CM | POA: Diagnosis not present

## 2024-01-11 DIAGNOSIS — F4010 Social phobia, unspecified: Secondary | ICD-10-CM

## 2024-01-11 DIAGNOSIS — Z62810 Personal history of physical and sexual abuse in childhood: Secondary | ICD-10-CM

## 2024-01-11 DIAGNOSIS — Z636 Dependent relative needing care at home: Secondary | ICD-10-CM
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
## 2024-01-13 ENCOUNTER — Other Ambulatory Visit: Payer: Self-pay | Admitting: Psychiatry

## 2024-01-15 ENCOUNTER — Encounter: Payer: Self-pay | Admitting: Psychiatry

## 2024-01-15 NOTE — Progress Notes (Signed)
 Casey Diaz [id number] [birth year] 58 y.o.    Subjective:   Patient ID:  Casey Diaz is a 58 y.o. (DOB 1966/07/26) female.  Chief Complaint:  Chief Complaint  Patient presents with   Follow-up   Depression   Anxiety   Stress   Sleeping Problem     HPI Casey Diaz presents to the office today for follow-up of OCD and severe anxiety.     December 2019 visit the following was noted: No meds were changed. Lives in [name] and back for followup.  Sx are about the same.  Has to take meds with different sizes. Pt reports that mood is Anxious and Depressed and describes anxiety as Severe. Anxiety symptoms include: Excessive Worry, Obsessive Compulsive Symptoms:   Checking,,. Pt reports has interrupted sleep and nocturia. Pt reports that appetite is good. Pt reports that energy is no change and down slightly. Concentration is down slightly. Suicidal thoughts:  denied by patient. Loves the environment of [name] but misses some things there.  She's not able to work there.  H works there and likes it.  Struggled with not working, feels isolated and not up to task of meeting people.  Does attend a church and met a friend who's been helpful.  Leaving for [name] on 10/16/18.   04/09/2020 appointment the following is noted:  Staying another year in [name] bc Covid and other things. Last few months a lot of crying spells.  Is in menopause. Wonders about med changes though is nervous about it.  Crying spells associated with depressing thoughts more than stress or OCD.   Covid really hard on everyone and couldn't see family for 18 mos.  Family still very dysfunctional. No close friends in part due to OCD and depression. Son high Autism spectrum with ADHD and anxiety and she's with him all the time. Greater health problems with CP so more pains.   05/15/20 appt with the following noted: [name] for menopause and helps some. Still depressed.  Chronically. In [place] for 2 more weeks then  to [name] for another year. A lot of stressors lately triggering more checking and anxiety.   OCD is her CC now and seems.  Got worse DT stress.   Stressed with Asberger's son and her health.  H works a lot.  Her FOO still stress. Plan: Trintellix 10 mg 1 tablet in the morning with food and reduce fluvoxamine to 5 tablets nightly for 1 week  then reduce it to 4 tablets nightly.   07/02/20 appt with the following noted: Decided not to get Trintellix bc difficulty getting it. It is available.  There.  Wants to start it now.   Both depression and OCD are severe.  Not suicidal in intent or plan. Did not take samples with her to [name] but will be back in December. covid is worse there and travel is difficult.  Wants to reduce Wellbutrin DT dry mouth. Plan: She's afraid to reduce Luvox at this time DT fear of worsening OCD.  But will consider. Trintellix 10 mg 1 tablet in the morning with food and reduce fluvoxamine to 5 tablets nightly for 1 week  then reduce it to 4 tablets nightly. Also reduce Wellbutrin XL to 300 mg daily.    9-13 2022 appointment with the following noted: Back in [place] since July 14.  Broke arm a month ago and surgery.  It's all been rough adjustment.   B has cancer on his face and M fell taking him  to the doctor.  Misses the water and weather of [name].   Cry a lot more since menopause. Still depression and anxiety and OCD.  Asks about ketamine. On Wellbutrin 300, Luvox 300.  No Trintellix. Added Ativan 2 mg AM and HS and it helps.  More likely to get upset at night. Plan: Increase Luvox back to 400 mg daily.  She thinks she's worse on less. Continue Wellbutrin XL to 300 mg daily. 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  She tolerated it well without unusual nausea or vomiting headache or other somatic symptoms.  She did have the expected dissociation which gradually  resolved over the course of the 2-hour period of observation.  She was a little concerned about her balance given her cerebral palsy but has not noted unusual or unexpected problems.  She is motivated to can continue Spravato in hopes of reducing her depressive symptoms. 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  She also has treatment resistant OCD which is partially managed with medications but is still quite disabling.  She is tolerating the medications well.  She is sleeping adequately.  Her appetite is adequate.  She is not having suicidal thoughts.  She continues to wish for a better treatment for OCD that would give her some relief.  09/30/2021 appointment with the following noted: She received her first dose of Spravato 84 mg intranasally today.  She tolerated it well without unusual nausea, vomiting, or other somatic symptoms.  Dissociation as expected did occur and gradually resolved over the 2-hour period of observation.  She did have a mild headache today with the treatment and received ibuprofen 600 mg at her request.  We will follow this to see if it is a pattern Patient is still depressed.  She said she was late with her medicine today and today was a particularly depressing day.  However she notes that the Spravato has lifted her mood considerably even today.  She is hopeful that it will continue to be helpful.  No suicidal thoughts.  She has ongoing chronic anxiety and OCD at baseline.  10/04/21 appt noted: Patient received Spravato 84 mg for the second time today.  She tolerated it well without any unusual headache, nausea or vomiting or other somatic symptoms.  Dissociation did occur and she gradually [place] resolution over the 2-hour period of observation. She did not have any unusual problems after she left the office last Spravato administration.  She did not have any specific problems with balance or walking.  She is at increased risk of that  difficulty because of cerebral palsy.  So far she has not noticed much mood effect from the medication beyond the first day of receiving it.  However she would like to continue Spravato in hopes of getting the antidepressant effect that is desired. Stress dealing with mother's behavior at party pt hosted.  Guilt over it.  10/07/2021 appointment noted: Patient received Spravato 84 mg for the second time today.  She tolerated it well without any unusual headache, nausea or vomiting or other somatic symptoms.  Dissociation did occur and she gradually [place] resolution over the 2-hour period of observation. She still is not sure about the antidepressant effect of Spravato.  Events over the holidays and demands, make it difficult to assess.  She still notes that the OCD tends to worsen the depression and vice versa.  She tolerates the Spravato well and wants to continue the trial.  10/15/2021 appointment with the following noted: Patient received Spravato 84 mg for the second time today.  She tolerated it well without any unusual headache, nausea or vomiting or other somatic symptoms.  Dissociation did occur and she gradually [place] resolution over the 2-hour period of observation. Patient says it was somewhat difficult to evaluate the effect of the Spravato.  It was scheduled to be twice weekly for 4 weeks consecutively but the holidays have interfered with that administration.  She asked what specifically should be she should be looking for in order to assess improvement.  That was discussed.  The OCD is unchanged and the depression so far is not significantly different.  She still tolerates meds.  There have been no recent med changes  10/19/2021 appt noted: Patient received Spravato 84 mg for the second today.  She tolerated it well without any unusual headache, nausea or vomiting or other somatic symptoms.  Dissociation did occur and she gradually saw resolution over the 2-hour period of observation.    10/21/2021 appointment noted: Patient received Spravato 84 mg today.  She tolerated it well without any unusual headache, nausea or vomiting or other somatic symptoms.  Dissociation did occur and she gradually saw resolution over the 2-hour period of observation.  She feels better than last week.  She is not as depressed and down.  She is still dealing with grief around the death of her cousin that was unexpected.  It is still difficult to tell how much the Spravato was doing but she is hopeful.  Anxiety is still present with the OCD.  She is not having suicidal thoughts.  She is not hopeless.  She wants to continue treatment.  10/25/2021 appointment with the following noted: Patient received Spravato 84 mg today.  She tolerated it well without any unusual headache, nausea or vomiting or other somatic symptoms.  Dissociation did occur and she gradually saw resolution over the 2-hour period of observation.  She does not typically find the dissociation very strong. She is beginning to think the Spravato is helping somewhat with the depression.  It has been difficult to tell with the holidays intervening as well as the death of her cousin.  She has not been able to get Spravato twice weekly for 4 weeks straight as typically planned.  However she is hopeful.  The OCD remains significant.  She still has a tendency to think very negatively.  She is not suicidal.  10/28/2021 appointment with the following noted: Patient received Spravato 84 mg today.  She tolerated it well without any unusual headache, nausea or vomiting or other somatic symptoms.  Dissociation did occur and she gradually saw resolution over the 2-hour period of observation.  She does not typically find the dissociation very strong. She is feeling more hopeful about the administration of Spravato.  She is having less depression she believes.  Still not dramatically different.  She still has a tendency to have a lot of anxiety and rumination and  OCD.  She is not suicidal.  She is eager to continue the Spravato.  11/01/2021 appointment with the following noted: Patient received Spravato 84 mg today.  She tolerated it well without any unusual headache, nausea or vomiting or other somatic symptoms.  Dissociation did occur and she gradually saw resolution over the 2-hour period of observation.  She does not typically find the dissociation very strong. She is continuing to see a little bit of  improvement in depression with Spravato.  The anxiety remains but may be not as severe.  The OCD remains markedly severe chronically.  She is not suicidal.  She is encouraged by the degree of improvement with Spravato and inability to enjoy things more and not be quite as ruminative.  11/04/2021 appt noted: Patient received Spravato 84 mg today.  She tolerated it well without any unusual headache, nausea or vomiting or other somatic symptoms.  Dissociation did occur and she gradually saw resolution over the 2-hour period of observation.  She does not typically find the dissociation very strong. No SE complaints with meds. She continues to feel hopeful about the Spravato.  She has less depression.  Because of a number of factors she is uncertain of the full benefit but thinks she is somewhat less depressed.  Her anxiety and OCD remain significant but a little better.  She is tolerating the medications and does not desire medicine change.  She is not currently complaining of insomnia.   11/08/2021 appointment the following noted: Patient received Spravato 84 mg today.  She tolerated it well without any unusual headache, nausea or vomiting or other somatic symptoms.  Dissociation did occur and she gradually saw resolution over the 2-hour period of observation.  She does not typically find the dissociation very strong. No SE complaints with meds. She feels the Spravato is helping somewhat.  She would like to see a greater effect.  However she is able to enjoy things.   She is productive at home.  She would like to see a lifting of a degree of sadness that remains.  The anxiety and OCD remained largely unchanged.  She wondered about the dosing of Wellbutrin 300 mg a day and Luvox 300 mg a day and possible increases.  She has been at higher doses in the past.  She plans to start water therapy for her weakness and for her shoulder.  11/11/2021 appointment with the following noted: Patient received Spravato 84 mg today.  She tolerated it well without any unusual headache, nausea or vomiting or other somatic symptoms.  Dissociation did occur and she gradually saw resolution over the 2-hour period of observation.  She does not typically find the dissociation very strong. No SE complaints with meds. She feels the Spravato is clearly helping the depression.  She would like to see a more significant effect.  She is still having trouble thinking positive. Her energy is fair.  Concentration is good except for the problem with chronic obsessions. She has been taking Wellbutrin 300 mg in Luvox 300 mg for quite some time but has taken higher doses in the past.  We discussed that.  She would like to try higher doses in order to get a better effect if possible. We just increased the doses a couple of days ago.  No effect yet.  11/15/2021 appointment with the following noted: Patient received Spravato 84 mg today.  She tolerated it well without any unusual headache, nausea or vomiting or other somatic symptoms.  Dissociation did occur and she gradually saw resolution over the 2-hour period of observation.  She does not typically find the dissociation very strong. No SE complaints with meds. The patient is now convinced that the Spravato is helping the depression.  She would like to continue twice weekly Spravato this week if possible.  She has tolerated the increase in Wellbutrin to 450 mg daily and the increase and fluvoxamine to 400 mg daily without complications thus far.  The OCD  and anxiety feed the depression to some extent. She spends approximately 2 hours daily with checking compulsions due to obsessions about causing harm to others.  For example fearing that when she has hit a pot hole that she may have hit a person and going back to check.  Checking corners and rooms out of fear that she may have harmed someone.  Other various checking compulsions.  She is hoping the increase in fluvoxamine to 400 mg will reduce that over the weeks to come.  She is not seeing a significant difference with the addition of the Spravato though she understands that was not expected.  She is more productive at home and more motivated and able to enjoy things more fully as a result of the Spravato treatment.  She is tolerating the medication  11/18/2021 appointment with the following noted: Patient received Spravato 84 mg today.  She tolerated it well without any unusual headache, nausea or vomiting or other somatic symptoms.  Dissociation did occur and she gradually saw resolution over the 2-hour period of observation.  She does not typically find the dissociation very strong. No SE complaints with meds. She clearly believes the Spravato has been helpful for the depression.  She wonders whether to continue to treatments weekly or to cut back to 1 weekly.  She would like to continue twice weekly in hopes of getting additional improvement in the depression because it is not resolved but it is difficult to get here twice a week in terms of arranging rides. She is recently increased Wellbutrin XL to 450 mg daily and fluvoxamine to 400 mg daily but they have not had time to have an official effect.  She is tolerating that well.  She is tolerating meds overwork overall well. The OCD remains the same as noted on 11/15/2021  11/25/21 appt noted: Patient received Spravato 84 mg today.  She tolerated it well without any unusual headache, nausea or vomiting or other somatic symptoms.  Dissociation did occur and  she gradually saw resolution over the 2-hour period of observation.  She does not typically find the dissociation very strong. No SE complaints with meds. She thinks the increase in Wellbutrin and Luvox have been potentially helpful for depression and OCD respectively.  It has been too early to see the full effect.  She is sleeping and eating well.  She is functioning at home.  She still spends a lot of time that is about 2 hours a day dealing with compulsive behaviors.  12/02/21 appt noted: Patient received Spravato 84 mg today.  She tolerated it well without any unusual headache, nausea or vomiting or other somatic symptoms.  Dissociation did occur and she gradually saw resolution over the 2-hour period of observation.  She does not typically find the dissociation very strong. No SE complaints with meds. Several losses and stressors recently that affect her sense of mood. However still sees significant benefit from the Spravato for her depression.  Wants to continue it. Suspect early  some benefit from the increased Wellbutrin for depression and Luvox for OCD. Tolerating meds. No complaints about the meds. Sleeping and eating well.  No new health concerns.  12/09/21 appt noted: Patient received Spravato 84 mg today.  She tolerated it well without any unusual headache, nausea or vomiting or other somatic symptoms.  Dissociation did occur and she gradually saw resolution over the 2-hour period of observation.  She does not typically find the dissociation very strong. No SE complaints with meds. Seeing noticeable  improvement from increase fluvoxamine to 400 mg daily.  Tolerating meds without concerns over them. Depression is stable with residual sx of easy guilt and easily stressed.  OCD contributes to depression but depression is not severe with less crying spells.  Productive at home with chores.  Enjoyed recent birthday.  Sleeping good. No new concerns.  12/23/2021 appointment noted: Patient  received Spravato 84 mg today.  She tolerated it well without any unusual headache, nausea or vomiting or other somatic symptoms.  Dissociation did occur and she gradually saw resolution over the 2-hour period of observation.  She does not typically find the dissociation very strong. No SE complaints with meds. Seeing noticeable improvement from increase fluvoxamine to 400 mg daily.  Tolerating meds without concerns over them. Her depression is somewhat improved with the Spravato.  She also feels generally a little lighter.  She is more motivated.  She is less overwhelmed by guilt.  The OCD is gradually improving but is still quite time-consuming as noted before.  She is sleeping well.  No side effects  12/30/2021 appointment with the following noted: Patient received Spravato 84 mg today.  She tolerated it well without any unusual headache, nausea or vomiting or other somatic symptoms.  Dissociation did occur and she gradually saw resolution over the 2-hour period of observation.  She does not typically find the dissociation very strong. No SE complaints with meds. Seeing noticeable improvement from increase fluvoxamine to 400 mg daily.  Tolerating meds without concerns over them. She is confident of her the improvement seen with Spravato.  She is less hopeless.  Guilt is marked remarkably improved.  She is not having any thoughts of death or dying.  She is more motivated for activities such as exercise which she is recently started.  She is sleeping well. The OCD remains severe but it is improving somewhat with the increase in fluvoxamine.  It is still consuming a couple hours per day.  01/10/22 apravato 84 admin  01/24/22 appt noted: Patient received Spravato 84 mg today.  She tolerated it well without any unusual headache, nausea or vomiting or other somatic symptoms.  Dissociation did occur and she gradually saw resolution over the 2-hour period of observation.  She does not typically find the  dissociation very strong. No SE complaints with meds. Very tearful today.  Feels like she has been suppressing emotion in the Spravato caused it to be released.  Discussed some stressors.  Overall still feels the medicine is helpful.  She has missed some of the scheduled Spravato treatments that were intended to be weekly due to circumstances beyond her control.  She is still struggling with OCD as previously noted but does believe the medications are helpful. Plan no med changes  01/31/2022 received Spravato 84 mg today  02/09/2022 appointment with the following noted: Patient received Spravato 84 mg today.  She tolerated it well without any unusual headache, nausea or vomiting or other somatic symptoms.  Dissociation did occur and she gradually saw resolution over the 2-hour period of observation.  She does not typically find the dissociation very strong. No SE complaints with meds. Spravato clearly helps depression and OCD but easily gets overwhelmed and tearful with fairly routine stressors.  Tolerating meds. Sleep and appetite is OK Asks to increase lorazepam to 2 mg AM and HS and 1mg  afternoon  02/16/22 appt noted: Patient received Spravato 84 mg today.  She tolerated it well without any unusual headache, nausea or vomiting or other somatic symptoms.  Dissociation did occur and she gradually saw resolution over the 2-hour period of observation.  She does not typically find the dissociation very strong. No SE complaints with meds. She has chronic depesssion and OCD but is improved with Spravato, both dx versus before.  She has continued Luvox 400 mg and Wellbutrin 450 mg and is tolerating it.  Chronically easily stressed.  Tolerating all meds.  Doesn't like taking more meds.  Spending a couple hours daily with OCD.  No SI No med changes.  02/21/22 appt noted:   Doesn't like taking more meds.  Spending a couple hours daily with OCD.  No SI No med changes.  02/21/22 appt noted: Patient received  Spravato 84 mg today.  She tolerated it well without any unusual headache, nausea or vomiting or other somatic symptoms.  Dissociation did occur and she gradually saw resolution over the 2-hour period of observation.  She does not typically find the dissociation very strong. No SE complaints with meds. She has chronic depesssion and OCD but is improved with Spravato, both dx versus before.  She has continued Luvox 400 mg and Wellbutrin 450 mg and is tolerating it.  Chronically easily overwhelmed and doesn't know why.  Tolerating all meds. Wants to continue meds.  03/16/22 appt noted: Patient received Spravato 84 mg today.  She tolerated it well without any unusual headache, nausea or vomiting or other somatic symptoms.  Dissociation did occur and she gradually saw resolution over the 2-hour period of observation.  She does not typically find the dissociation very strong. No SE complaints with meds. Overall she still feels the Spravato has been helpful not only for her depression but also for her OCD which was somewhat unexpected.  OCD is still significant but it is less severe than prior to starting Spravato.  She is tolerating Luvox 400 mg and Wellbutrin 450 mg.  We discussed possible med adjustments.  03/23/22 appt noted: Patient received Spravato 84 mg today.  She tolerated it well without any unusual headache, nausea or vomiting or other somatic symptoms.  Dissociation did occur and she gradually saw resolution over the 2-hour period of observation.  She does not typically find the dissociation very strong. No SE complaints with meds. She is still depressed and still has OCD of course but is improved with the Spravato.  She is tolerating the medications well.  We had previously discussed the possibility of switching some of the Wellbutrin to [facility] and she is very interested in that in hopes of further improvement in depression and OCD.  She understands that [place] is not used for OCD on the label.   She is tolerating the medications.  She is still easily overwhelmed.  She is sleeping and eating okay.. Plan: Reduce Wellbutrin XL to 300 mg AM and add [place] 1 tablet each AM  03/30/22 appt noted: Patient received Spravato 84 mg today.  She tolerated it well without any unusual headache, nausea or vomiting or other somatic symptoms.  Dissociation did occur and she gradually saw resolution over the 2-hour period of observation.  She does not typically find the dissociation very strong. No SE complaints with meds. She is still depressed and still has OCD of course but is improved with the Spravato.  She is tolerating the medications well.  No difference with Auvelity 1 AM so far and no SE.  Going on vacation on Saturday. 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 Sleep and appetite good. Plan: Increase Auvelity to 1 twice daily and  reduce Wellbutrin to XL 150 every morning  04/14/2022 appointment with the following noted: Patient received Spravato 84 mg today.  She tolerated it well without any unusual headache, nausea or vomiting or other somatic symptoms.  Dissociation did occur and she gradually saw resolution over the 2-hour period of observation.  She does not typically find the dissociation very strong. No SE complaints with meds. She is still depressed and still has OCD of course but is improved with the Spravato.  She is tolerating the medications well.  Just increased Auvelity to BID yesterday and reduced Wellbutrin to 150 AM. No SE so far.  No change in mood or anxiety so far.  Chronic OCD as noted and residual depression and chronic fatigue.  04/21/2022 appointment with the following noted: Patient received Spravato 84 mg today.  She tolerated it well without any unusual headache, nausea or vomiting or other somatic symptoms.  Dissociation did occur and she gradually saw resolution over the 2-hour period of observation.  She does not typically find the dissociation very strong. No  SE complaints with meds. She is still depressed and still has OCD of course but is improved with the Spravato.   She has questions about the dosing of lorazepam. She tends to have negative anxious thoughts at night.  This tends to interfere with her ability to go to sleep.  She is getting about 8 to 9 hours of sleep.  She is tolerating the meds without excessive sedation and does not nap during the day.  05/06/22 appt noted: Patient received Spravato 84 mg today.  She tolerated it well without any unusual headache, nausea or vomiting or other somatic symptoms.  Dissociation did occur and she gradually saw resolution over the 2-hour period of observation.  She does not typically find the dissociation very strong. No SE complaints with meds. She is still depressed and still has OCD of course but is improved with the Spravato.   Had some questions about timing of dosing of fluvoxamine and Auvelity. OCD is not quite as time consuming.  Sleep and eating are the same.   No SE meds.  05/25/22 appt noted: Patient received Spravato 84 mg today.  She tolerated it well without any unusual headache, nausea or vomiting or other somatic symptoms.  Dissociation did occur and she gradually saw resolution over the 2-hour period of observation.  She does not typically find the dissociation very strong. No SE complaints with meds. She is still depressed and still has OCD of course but is improved with the Spravato.   She has less OCD when away from home and on vacation of note. Plan: Rec gradually reduce HS lorazepam to 1 mg Hs.  Can continue lorazepam 2 mg AM and 1 mg in afternoon bc of chronic anxiety and it is helpful and tolerated. She can continue temazepam 30 mg nightly.  She tends to have a lot of anxious negative thoughts at night when she is trying to go to bed  06/16/22 appt noted: Patient received Spravato 84 mg today.  She tolerated it well without any unusual headache, nausea or vomiting or other somatic  symptoms.  Dissociation did occur and she gradually saw resolution over the 2-hour period of observation.  She does not typically find the dissociation very strong. No SE complaints with meds. She is still depressed and still has OCD of course but is improved with the Spravato.   She has less OCD when away from home and on vacation of note. She  is tolerating the medications.  She has continued current medications. 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  She still receives benefit from [place].  Her depression and OCD are better with the Spravato.  She thinks she is getting additional benefit with the switch from Wellbutrin to [place].  07/04/2022 appointment noted: Reports she developed a rash on her face from [facility] and feels like she is allergic to it.  She stopped it and went back to Wellbutrin 450 mg every morning.  The rash has cleared up.  She did not require any medical attention and did not have shortness of breath. Overall her depression and OCD are about the same as they have been.  She did not notice a substantial difference from the brief treatment with Auvelity but she understands she did not take a full course.  She is tolerating the current medicines well. Current meds fluvoxamine 400 mg daily, Wellbutrin XL 450 mg daily, lorazepam 1 to 2 mg in the morning and 1 to 2 mg at night and 1 mg in the afternoon, temazepam 30 mg nightly. She wants to continue the Spravato because she feels it has been helpful for both her depression and her racing OCD  07/18/22 appt noted: Patient received Spravato 84 mg today.  She tolerated it well without any unusual headache, nausea or vomiting or other somatic  symptoms.  Dissociation did occur and she gradually saw resolution over the 2-hour period of observation.  She does not typically find the dissociation very strong. No SE complaints with meds. She is still depressed and still has OCD of course but is improved with the Spravato.  Rash better off Auvelity and back on Welllbutrin XL 450 mg AM, fluvoxamine 400 mg daily.  08/15/22 appt noted: Current psych meds: Wellbutrin XL 450 mg AM, fluvoxamine 100 mg in AM and 300 mg HS, lorazepam 1 mg 1-2 mg in the AM and HS and 1 tablet prn midday for anxiety, temazepam 30 mg HS Patient received Spravato 84 mg today.  She tolerated it well without any unusual headache, nausea or vomiting or other somatic symptoms.  Dissociation did occur and she gradually saw resolution over the 2-hour period of observation.  She does not typically find the dissociation very strong. No SE complaints with meds. She has a great deal of stress dealing with her family.  Disc brother's ongoing mania and difficulty getting him help and the stress he causes for the family. She wants to continue Spravato through this very stressful holdicay season and reevaluate the frequency after the New Year.  09/12/22 appt noted: Current psych meds: Wellbutrin XL 450 mg AM, fluvoxamine 100 mg in AM and 300 mg HS, lorazepam 1 mg 1-2 mg in the AM and HS and 1 tablet prn midday for anxiety, temazepam 30 mg HS Patient received Spravato 84 mg today.  She tolerated it well without any unusual headache, nausea or vomiting or other somatic symptoms.  Dissociation did occur and she gradually saw resolution over the 2-hour period of observation.  She does not typically find the dissociation very strong. No SE complaints with meds. She has a great deal of stress dealing with her family.  Disc brother's ongoing mania and difficulty getting him help and the stress he causes for the family. She wants to continue Spravato through this very stressful holdicay season  and reevaluate the frequency after the New Year.  Chronically easily overwhelmed with family. Complaining of HA and history migraine.  Asks for increase imitrex and disc preventatives like propranolol ER  09/26/22 appt noted: Current psych meds: Wellbutrin XL 450 mg AM, fluvoxamine 100 mg in AM and 300 mg HS, lorazepam 1 mg 1-2 mg in the AM and HS and 1 tablet prn midday for anxiety, temazepam 30 mg HS Patient received Spravato 84 mg today.  She tolerated it well without any unusual headache, nausea or vomiting or other somatic symptoms.  Dissociation did occur and she gradually saw resolution over the 2-hour period of observation.  She does not typically find the dissociation very strong. No SE complaints with meds. She has a great deal of stress dealing with her family.  This is ongoing The holidays are much more stressful DT family problems.  She is noting OCD is much worse over the last couple of week.  Depression is better with Spravato. Needed higher dose meds for migraine.   10/03/22 appt noted: Current psych meds: Wellbutrin XL 450 mg AM, fluvoxamine 100 mg in AM and 300 mg HS, lorazepam 1 mg 1-2 mg in the AM and HS and 1 tablet prn midday for anxiety, temazepam 30 mg HS Patient received Spravato 84 mg today.  She tolerated it well without any unusual headache, nausea or vomiting or other somatic symptoms.  Dissociation did occur and she gradually saw resolution over the 2-hour period of observation.  She does not typically find the dissociation very strong. No SE complaints with meds. She has now realized that the rash she previously previously attributed to [facility] was not related.  She is interested may be retrying that after the holidays.  She is tolerating medications otherwise. The holidays remain chronically stressful to her due to family dynamic problems which cause her to consistently feel stuck.  Under more stress her OCD is worse.  She will have a tendency to have crying spells.   The depression and OCD are still improved with Spravato as compared to before.  11/15/22 appt noted: Current psych meds: Wellbutrin XL 450 mg AM, fluvoxamine 100 mg in AM and 300 mg HS, lorazepam 1 mg 1-2 mg in the AM and HS and 1 tablet prn midday for anxiety, temazepam 30 mg HS Patient received Spravato 84 mg today.  She tolerated it well without any unusual headache, nausea or vomiting or other somatic symptoms.  Dissociation did occur and she gradually saw resolution over the 2-hour period of observation.  She does not typically find the dissociation very strong. No SE complaints with meds. Continues to feel depressed and overwhelmed by family problems including her brother's mania and recent eviction and commitment.  Chronic OCD worse when stressed.  No SI.  Tolerating meds. Plan: Per her request continue Wellbutrin XL 450 mg every morning. She has come to the realization that the rash she had previously attributed to [facility] was not related.  She is interested in perhaps retrying Auvelity. There are few alternative medication options that remain.  01/11/23 appt noted: Current psych meds: Wellbutrin XL 150 mg BID and started Auvelity 1 AM, fluvoxamine 100 mg in AM and 300 mg HS, lorazepam 1 mg 1-2 mg in the AM and HS and 1 tablet prn midday for anxiety, temazepam 30 mg HS  Patient received Spravato 84 mg today.  She tolerated it well without any unusual headache, nausea or vomiting or other somatic symptoms.  Dissociation did occur and she gradually saw resolution over the 2-hour period of observation.  She does not typically find the dissociation very strong. No SE complaints with meds. Trouble getting to sessions lately DT transportation problems.   Continues to feel depressed and overwhelmed by OCD and family.  Feels she needs toevery other week Spravato bc it helps for a couple of weeks and then seems to wear off.  Struggleing with OCD and depression both of which are eased by Spravato. Less  crying with Auvelity.  02/07/23 appt noted: Current psych meds: Wellbutrin XL 150 mg BID and started Auvelity 1 AM, fluvoxamine 100 mg in AM and 300 mg HS, lorazepam 1 mg 1-2 mg in the AM and HS and 1 tablet prn midday for anxiety, temazepam 30 mg HS Patient received Spravato 84 mg today.  She tolerated it well without any unusual headache, nausea or vomiting or other somatic symptoms.  Dissociation did occur and she gradually saw resolution over the 2-hour period of observation.  She does not typically find the dissociation very strong. No SE complaints with meds. Trouble getting to sessions lately DT transportation problems.  This is a problem ongoing and thinks she might need to pause Spravato bc won't be able to get her for at least 3 weeks. She is holding pretty steady with a moderate level of anxiety and depression ongoing and chronic.    03/20/23 appt noted: Current psych meds: Wellbutrin XL 150 mg BID and started Auvelity 1 AM, fluvoxamine 100 mg in AM and 300 mg HS, lorazepam 1 mg 1-2 mg in the AM and HS and 1 tablet prn midday for anxiety, temazepam 30 mg HS Patient received Spravato 84 mg today.  She tolerated it well without any unusual headache, nausea or vomiting or other somatic symptoms.  Dissociation did occur and she gradually saw resolution over the 2-hour period of observation.  She does not typically find the dissociation very strong. No SE complaints with meds. She is trying to get back into more regular Spravato administration.  Family issues and transportation problems that led to her missing Spravato.  She feels more depressed without regular Spravato.  Her OCD is chronic but also worse when she misses Spravato.  She does not want any medication changes.  04/03/23 appt noted: Current psych meds: Wellbutrin XL 150 mg BID and started Auvelity 1 AM, fluvoxamine 100 mg in AM and 300 mg HS, lorazepam 1 mg 1-2 mg in the AM and HS and 1 tablet prn midday for anxiety, temazepam 30 mg  HS Patient received Spravato 84 mg today.  She tolerated it well without any unusual headache, nausea or vomiting or other somatic symptoms.  Dissociation did occur and she gradually saw resolution over the 2-hour period of observation.  She does not typically find the dissociation very strong.  It gets rid of negative emotion for awhile after procedure and would like it to last longer.   No SE complaints with meds.  Doesn't really want med changes.   Planning to weekly Spravato.  It is helping dep and anxiety.    04/10/23 appt noted: Current psych meds: Wellbutrin XL 150 mg BID and started Auvelity 1 AM, fluvoxamine 100 mg in AM and 300 mg HS, lorazepam 1 mg 1-2 mg in the AM and HS and 1 tablet prn midday for anxiety, temazepam 30 mg  HS Patient received Spravato 84 mg today.  She tolerated it well without any unusual headache, nausea or vomiting or other somatic symptoms.  Dissociation did occur and she gradually saw resolution over the 2-hour period of observation.  She does not typically find the dissociation very strong.  It gets rid of negative emotion for awhile after procedure and would like it to last longer.   No SE complaints with meds.  Doesn't really want med changes.   Had lidocaine shot for back pain with brief benefit.  Doing some water based PT Spravato went well today. Got pretty upset last week with event related to son's activities.  Got upset with son saying something inappropriate publicly.  Was so embarrassed.  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    He's kind of rebellious lately.  Son is 57 yo.    05/03/23 appt noted: Current psych meds: Wellbutrin XL 150 mg BID and started Auvelity 1 AM, fluvoxamine 100 mg in AM and 300 mg HS, lorazepam 1 mg 1-2 mg in the AM and HS and 1 tablet prn midday for anxiety, temazepam 30 mg HS No SE Patient received Spravato 84 mg today.  She tolerated it well without any unusual headache, nausea or  vomiting or other somatic symptoms.  Dissociation did occur and she gradually saw resolution over the 2-hour period of observation.  She does not typically find the dissociation very strong.  It gets rid of negative emotion for awhile after procedure and would like it to last longer.   Still pleased with benefit from [facility] for mood and anxiety. No SE complaints with meds.  Doesn't really want med changes.   Chronic family px ongoing affects her but nothing she can change.H sick of the family drama.   Continuing counseling helps.  Has some support. Mood and anxiety pretty steady but did go on vacation to [place].  Anxiety worse first in AM and then later at night with mind racing on stressors.   Still back trouble.  05/23/23 appt noted: Current psych meds: Wellbutrin XL 150 mg BID and started Auvelity 1 AM, fluvoxamine 100 mg in AM and 300 mg HS, lorazepam 1 mg 1-2 mg in the AM and HS and 1 tablet prn midday for anxiety, temazepam 30 mg HS No SE Patient received Spravato 84 mg today.  She tolerated it well without any unusual headache, nausea or vomiting or other somatic symptoms.  Dissociation did occur and she gradually saw resolution over the 2-hour period of observation.  She does not typically find the dissociation very strong.  It gets rid of negative emotion for awhile after procedure and would like it to last longer.   Still pleased with benefit from [facility] for mood and anxiety by 50%. No SE complaints with meds. Chronic family stress interferes with mental health and self care.  May have to reduce frequency of Spravato bc of this. But is status quo with meds.  Chronic residual OCD which is mod severe and dep moderate.  05/30/23 appt noted: Current psych meds: Wellbutrin XL 150 mg BID and started Auvelity 1 AM, fluvoxamine 100 mg in AM and 300 mg HS, lorazepam 1 mg 1-2 mg in the AM and HS and 1 tablet prn midday for anxiety, temazepam 30 mg HS No SE Patient received Spravato 84 mg  today.  She tolerated it well without any unusual headache, nausea or vomiting or other somatic symptoms.  Dissociation did occur and she gradually saw resolution over the 2-hour period of observation.  She does not typically find the dissociation very strong.  It gets rid of negative emotion for awhile after procedure and would like it to last longer.   Still pleased with benefit from [facility] for mood and anxiety by 50% or better but not resolved. She wants to continue Spravato as frequently as schedule will allow. Both depression and OCD are better with most improvement in mood.  Asks about any new treatments for OCD. No SE complaints with meds. Chronic family stress interferes with mental health and self care.  This is an ongoing drain on her mood and resources emotionally.    06/06/23 appt noted: Current psych meds: Wellbutrin XL 150 mg BID and started Auvelity 1 AM, fluvoxamine 100 mg in AM and 300 mg HS, lorazepam 1 mg 1-2 mg in the AM and HS and 1 tablet prn midday for anxiety, temazepam 30 mg HS No SE Patient received Spravato 84 mg today.  She tolerated it well without any unusual headache, nausea or vomiting or other somatic symptoms.  Dissociation did occur and she gradually saw resolution over the 2-hour period of observation.  She does not typically find the dissociation very strong.  It gets rid of negative emotion for awhile after procedure and would like it to last longer.   Still believes each meds work and are helpful and well tolerated.  Specifically she thinks that the Auvelity adds additional benefit on taking Wellbutrin.  She believes the meds help both depression and OCD though the OCD remains a problem.  She also believes Spravato helps both types of symptoms as well.  Her mood is variable.  She experiences a great deal of stress from her family and those problems wax and wane.  She has bad days because of it and today is 1 of those days.  She is continuing counseling and that is  helpful.  Due to family demands she may have to spread out the Spravato frequency.  06/16/23 appt noted:  Current psych meds: Wellbutrin XL 150 mg BID and started Auvelity 1 AM, fluvoxamine 100 mg in AM and 300 mg HS, lorazepam 1 mg 1-2 mg in the AM and HS and 1 tablet prn midday for anxiety, temazepam 30 mg HS No SE Patient received Spravato 84 mg today.  She tolerated it well without any unusual headache, nausea or vomiting or other somatic symptoms.  Dissociation did occur and she gradually saw resolution over the 2-hour period of observation.  She does not typically find the dissociation very strong.  It gets rid of negative emotion for awhile after procedure . She is trying to make the Schedule work for [employer] oh every week or every other week because she is aware the treatment effects can be lost if it is time less frequently.  So she has been able to come the last 2 weeks, we will try to come in 2 weeks.  Spravato reduces depression and OCD significantly though she remains highly symptomatic with both.  However she has no suicidal thoughts.  Crying spells are reduced with Spravato.  She still spends a fair amount of time meaning over an hour a day checking and more under stress or when tired.  She asks about any new meds for OCD. Plan: increase Auvelity 1 in AM & PM Hold Wellbutrin XL 150 mg BID  07/12/23 appt noted: Current psych meds: Wellbutrin XL 150 mg BID and started [employer]  1 AM, fluvoxamine 100 mg in AM and 300 mg HS, lorazepam 1 mg 1-2 mg in the AM and HS and 1 tablet prn midday for anxiety, temazepam 30 mg HS.  Didn't increase Auvelity yet No SE Patient received Spravato 84 mg today.  She tolerated it well without any unusual headache, nausea or vomiting or other somatic symptoms.  Dissociation did occur and she gradually saw resolution over the 2-hour period of observation.  She does not typically find the dissociation very strong.  It gets rid of negative emotion for awhile after  procedure . She has not increased the Auvelity yet is planned.  She has a little bit of nervousness about it but admits is not rational.  She is willing to give that a try to try to get better control of depression and anxiety.  She continues to see the benefit of Spravato every other week.  She is struggling with some pain due to plantar fasciitis and given her cerebral palsy that makes it even more difficult to walk and to engage in normal activities.  She is willing to seek treatment for that.  07/25/23 appt noted: Current psych meds: Wellbutrin XL 150 mg AM and started Auvelity 1 BID, fluvoxamine 100 mg in AM and 300 mg HS, lorazepam 1 mg 1-2 mg in the AM and HS and 1 tablet prn midday for anxiety, temazepam 30 mg HS.  Didn't increase Auvelity yet No SE Patient received Spravato 84 mg today.  She tolerated it well without any unusual headache, nausea or vomiting or other somatic symptoms.  Dissociation did occur and she gradually saw resolution over the 2-hour period of observation.  She does not typically find the dissociation very strong.  It gets rid of negative emotion for awhile after procedure . Chronic dep and anxiety to some extent.  But has increased Auvelity to BID recently and no SE yet.  Disc frequency Spravato bc benefit and will try to continue every other week and not less if possible. No further concerns for meds.  08/08/23 appt noted: Current psych meds: Wellbutrin XL 150 mg AM and Auvelity 1 BID, fluvoxamine 100 mg in AM and 300 mg HS, lorazepam 1 mg 1-2 mg in the AM and HS and 1 tablet prn midday for anxiety, temazepam 30 mg HS.  Didn't increase Auvelity yet No SE with med changes. Patient received Spravato 84 mg today.  She tolerated it well without any unusual headache, nausea or vomiting or other somatic symptoms.  Dissociation did occur and she gradually saw resolution over the 2-hour period of observation.  She does not typically find the dissociation very strong.  It gets rid  of negative emotion for awhile after procedure . Mornings still worse with dep and anxiety.  But mood and anxiety are better with the increase in Auvelity to BID.  OCD seems a little better.  Chronic difficulty handling the complaints and stress from her family.  Easily stresser her out.  No med changes desire. She will have to take a break from [facility] DT pending foot surgery.  Mobility and transportation will be limited.  08/25/23 appt noted: Current psych meds: Wellbutrin XL 150 mg AM and Auvelity 1 BID, fluvoxamine 100 mg in AM and 300 mg HS, lorazepam 1 mg 1-2 mg in the AM and HS and 1 tablet prn midday for anxiety, temazepam 30 mg HS.  Didn't increase Auvelity yet No SE with med changes. Patient received Spravato 84 mg today.  She tolerated it well  without any unusual headache, nausea or vomiting or other somatic symptoms.  Dissociation did occur and she gradually saw resolution over the 2-hour period of observation.  She does not typically find the dissociation very strong.  It gets rid of negative emotion for awhile after procedure . Pending foot surgery next week.  Able to work in [employer] this week which helps with dep and anxiety and OCD.  Struggles with sleep without multiple meds as noted.  No adverse effects.  Will return to Spravato asap after foot surgery  10/04/23 appt noted: Current psych meds: Wellbutrin XL 150 mg AM and Auvelity 1 BID, fluvoxamine 100 mg in AM and 300 mg HS, lorazepam 1 mg 1-2 mg in the AM and HS and 1 tablet prn midday for anxiety, temazepam 30 mg HS.  Didn't increase Auvelity yet No SE with med changes. Patient received Spravato 84 mg today.  She tolerated it well without any unusual headache, nausea or vomiting or other somatic symptoms.  Dissociation did occur and she gradually saw resolution over the 2-hour period of observation.  She does not typically find the dissociation very strong.  It gets rid of negative emotion for awhile after procedure . Mood and  anxiety stable.  Benefit with meds and Spravato.   She reports OCD time consumption typically is much better than in the past when it was routinely 2 hours daily.  Now less than have of that.    Chronic caregiver stress with dysfunctional family and has trouble with boundaries.  Working on that in therapy.  10/24/23 appt noted: Current psych meds: Wellbutrin XL 150 mg AM and Auvelity 1 BID, fluvoxamine 100 mg in AM and 300 mg HS, lorazepam 1 mg 1-2 mg in the AM and HS and 1 tablet prn midday for anxiety, temazepam 30 mg HS.  No SE with med changes. She plans to stop Spravato bc couldn't get adequate transportation.  Her mood is continuing to be highly reactive with what's going on with family esp mother and brother.  Mother self absorbed and can be mean yet demanding.   She doesn't fear getting worse if she stops Spravato though this has happened in the past.   Lately less dep though can be pulled down quickly by family of origin interactions.  Narcissistic mother.  Thinking of PT work to try to keep her mind more occupied. Plan no changes  01/15/24 appt noted: Current psych meds: Wellbutrin XL 150 mg AM and Auvelity 1 BID, fluvoxamine 100 mg in AM and 300 mg HS, lorazepam 1 mg 1-2 mg in the AM and HS and 1 tablet prn midday for anxiety, temazepam 30 mg HS. No SE with med changes. Patient received Spravato 84 mg today.  She tolerated it well without any unusual headache, nausea or vomiting or other somatic symptoms.  Dissociation did occur and she gradually saw resolution over the 2-hour period of observation.  She does not typically find the dissociation very strong.  It gets rid of negative emotion for awhile after procedure . She wanted to resume Spravato bc 30% benefit for dep and anxiety.  Chronic struggles with OCD and family stress and handles it better when on Spravato. No problems with meds and no desire for changes.   Previous psych med trials include Prozac, paroxetine, sertraline,  fluvoxamine, venlafaxine, Anafranil with no response,  Wellbutrin, Viibryd, Trintellix 10 1 month NR Auvelity BID   Geodon,  risperidone, Rexulti, Abilify,  Seroquel, Latuda 40 mg with irritability.   lamotrigine  lithium,  BuSpar, Namenda,  pramipexole with no response, and Topamax, pindolol  2 brothers & 1 sister poverty, 1 B with untreated bipolar disorder lives with mother  ECT-MADRS    Flowsheet Row Office Visit from 06/29/2021 in [facility]  MADRS Total Score 36      Flowsheet Row Admission (Discharged) from 06/11/2021 in [facility]  C-SSRS RISK CATEGORY No Risk       Review of Systems:  Review of Systems  Constitutional:  Positive for fatigue.  Cardiovascular:  Negative for chest pain and palpitations.  Musculoskeletal:  Positive for arthralgias, back pain, gait problem, myalgias and neck pain.  Neurological:  Positive for weakness and headaches. Negative for tremors.  Psychiatric/Behavioral:  Positive for dysphoric mood and sleep disturbance. Negative for suicidal ideas. The patient is nervous/anxious.     Medications: I have reviewed the patient's current medications.  Current Outpatient Medications  Medication Sig Dispense Refill   Abaloparatide (TYMLOS) 3120 MCG/1.56ML SOPN Inject into the skin.     AUVELITY 45-105 MG TBCR TAKE 1 TABLET BY MOUTH IN THE MORNING AND AT BEDTIME 60 tablet 1   AUVELITY 45-105 MG TBCR TAKE 1 TABLET BY MOUTH IN THE MORNING AND IN THE EVENING 60 tablet 0   Azelastine-Fluticasone 137-50 MCG/ACT SUSP Place 1-2 sprays into both nostrils daily.     baclofen (LIORESAL) 10 MG tablet Take 20 mg by mouth at bedtime as needed for muscle spasms.     buPROPion (WELLBUTRIN XL) 150 MG 24 hr tablet Take 1 tablet (150 mg total) by mouth in the morning. 30 tablet 0   dicyclomine (BENTYL) 10 MG capsule Take 10 mg by mouth daily.     docusate sodium (COLACE) 100 MG capsule Take 1 capsule (100 mg total) by  mouth 2 (two) times daily. (Patient taking differently: Take 100 mg by mouth daily.) 10 capsule 0   Esketamine HCl, 84 MG Dose, (SPRAVATO, 84 MG DOSE,) 28 MG/DEVICE SOPK USE 3 SPRAYS IN EACH NOSTRIL ONCE A WEEK 3 each 0   fexofenadine (ALLEGRA) 180 MG tablet Take 180 mg by mouth daily.     fluvoxaMINE (LUVOX) 100 MG tablet TAKE 1 TABLET IN THE AM AND 3 TABLETS AT NIGHT 120 tablet 2   hydrocortisone (ANUSOL-HC) 2.5 % rectal cream Place rectally 2 (two) times daily. x 7-14 days 30 g 0   ketotifen (ZADITOR) 0.025 % ophthalmic solution Place 3 drops into both eyes at bedtime.     LORazepam (ATIVAN) 1 MG tablet TAKE 1-2 IN AM,1-2 TABS EVERY NIGHT AT BEDTIME AND 1 TAB IN AFTERNOON WHEN NEEDED FOR ANXIETY/SLEEP 150 tablet 1   magnesium gluconate (MAGONATE) 500 MG tablet Take 500 mg by mouth daily.     meloxicam (MOBIC) 15 MG tablet Take 1 tablet (15 mg total) by mouth daily. 30 tablet 0   meloxicam (MOBIC) 15 MG tablet TAKE 1 TABLET (15 MG TOTAL) BY MOUTH DAILY. 30 tablet 0   methylPREDNISolone (MEDROL DOSEPAK) 4 MG TBPK tablet Take as directed (Patient not taking: Reported on 01/15/2024) 21 each 0   MIBELAS 24 FE 1-20 MG-MCG(24) CHEW Chew 1 tablet by mouth at bedtime as needed (bowel regularity).     Multiple Vitamins-Minerals (ADULT GUMMY PO) Take 2 tablets by mouth in the morning.     nitrofurantoin (MACRODANTIN) 100 MG capsule Take 100 mg by mouth as needed (For urinary tract infection.).      polyethylene glycol (MIRALAX / GLYCOLAX) packet Take 17 g by  mouth daily as needed for mild constipation. 14 each 0   propranolol ER (INDERAL LA) 60 MG 24 hr capsule TAKE 1 CAPSULE BY MOUTH EVERY DAY 90 capsule 0   psyllium (METAMUCIL) 58.6 % powder Take 1 packet by mouth daily as needed (constipation).     SUMAtriptan (IMITREX) 100 MG tablet Take 1 tablet (100 mg total) by mouth every 2 (two) hours as needed for migraine. May repeat in 2 hours if headache persists or recurs. 10 tablet 2   temazepam (RESTORIL) 30  MG capsule Take 1 capsule by mouth at bedtime as needed for sleep 30 capsule 0   Vitamin D-Vitamin K (VITAMIN K2-VITAMIN D3 PO) Take 1-2 sprays by mouth daily.     oxyCODONE-acetaminophen (PERCOCET/ROXICET) 5-325 MG tablet Take 1-2 tablets by mouth every 6 (six) hours as needed for severe pain. (Patient not taking: Reported on 01/15/2024) 50 tablet 0   No current facility-administered medications for this visit.    Medication Side Effects: None   Allergies:  Allergies  Allergen Reactions   Hydrocodone Itching   Sulfamethoxazole-Trimethoprim Itching   Dust Mite Extract Other (See Comments)    Sneezing, watery eyes, runny nose   Latex Itching   Other Other (See Comments)    PT IS ALLERGIC TO CAT DANDER AND RAGWEED - Sneezing, watery eyes, runny nose    Pollen Extract Other (See Comments)    Sneezing, watery eyes, runny nose     Past Medical History:  Diagnosis Date   Abnormal Pap smear 2011   hpv/mild dysplasia,cin1   Anxiety    Cerebral palsy (HCC)    right arm/leg   Cystocele    Depression    Headache    Neuromuscular disorder (HCC)    Cerebral Palsy   OCD (obsessive compulsive disorder)    Osteoporosis    Uterine prolaps     Family History  Problem Relation Age of Onset   Cancer Father        skin AND LUNG   Alcohol abuse Sister        CRACK COCAINE    Social History   Socioeconomic History   Marital status: Married    Spouse name: Not on file   Number of children: Not on file   Years of education: Not on file   Highest education level: Not on file  Occupational History   Not on file  Tobacco Use   Smoking status: Never   Smokeless tobacco: Never  Substance and Sexual Activity   Alcohol use: Not Currently    Comment: OCCASIONAL beer   Drug use: No   Sexual activity: Yes    Birth control/protection: Pill    Comment: LOESTRIN 24 FE  Other Topics Concern   Not on file  Social History Narrative   Not on file   Social Drivers of Health    Financial Resource Strain: Not on file  Food Insecurity: Not on file  Transportation Needs: Not on file  Physical Activity: Not on file  Stress: Not on file  Social Connections: Not on file  Intimate Partner Violence: Not on file    Past Medical History, Surgical history, Social history, and Family history were reviewed and updated as appropriate.   Please see review of systems for further details on the patient's review from today.   Objective:   Physical Exam:  LMP  (LMP Unknown)   Physical Exam Constitutional:      General: She is not in acute distress. Neurological:  Mental Status: She is alert and oriented to person, place, and time.     Cranial Nerves: No dysarthria.     Motor: Weakness present.     Gait: Gait abnormal.  Psychiatric:        Attention and Perception: Attention and perception normal.        Mood and Affect: Mood is anxious and depressed. Affect is not tearful.        Speech: Speech normal. Speech is not slurred.        Behavior: Behavior normal. Behavior is cooperative.        Thought Content: Thought content normal. Thought content is not delusional. Thought content does not include homicidal or suicidal ideation. Thought content does not include suicidal plan.        Cognition and Memory: Cognition and memory normal. Cognition is not impaired.        Judgment: Judgment normal.     Comments: Insight intact Ongoing OCD remains fairly severe but less anxious Checking compulsions now about 30 mins Chronic depression persistent but better than it was a year ago.       Lab Review:     Component Value Date/Time   NA 138 06/11/2021 0606   K 4.0 06/11/2021 0606   CL 107 06/11/2021 0606   CO2 26 06/11/2021 0606   GLUCOSE 90 06/11/2021 0606   BUN 18 06/11/2021 0606   CREATININE 0.81 06/11/2021 0606   CALCIUM 9.4 06/11/2021 0606   PROT 6.5 06/11/2021 0606   ALBUMIN 3.3 (L) 06/11/2021 0606   AST 17 06/11/2021 0606   ALT 14 06/11/2021 0606    ALKPHOS 141 (H) 06/11/2021 0606   BILITOT 0.2 (L) 06/11/2021 0606   GFRNONAA >60 06/11/2021 0606   GFRAA >60 07/09/2016 0438       Component Value Date/Time   WBC 5.8 06/11/2021 0606   RBC 4.12 06/11/2021 0606   HGB 12.5 06/11/2021 0606   HCT 39.7 06/11/2021 0606   PLT 299 06/11/2021 0606   MCV 96.4 06/11/2021 0606   MCH 30.3 06/11/2021 0606   MCHC 31.5 06/11/2021 0606   RDW 13.9 06/11/2021 0606   LYMPHSABS 1.9 06/11/2021 0606   MONOABS 0.5 06/11/2021 0606   EOSABS 0.1 06/11/2021 0606   BASOSABS 0.0 06/11/2021 0606    No results found for: "POCLITH", "LITHIUM"   No results found for: "PHENYTOIN", "PHENOBARB", "VALPROATE", "CBMZ"   .res Assessment: Plan:    Jenee "[name]" was seen today for follow-up, depression, anxiety, stress and sleeping problem.  Diagnoses and all orders for this visit:  Recurrent major depression resistant to treatment [facility])  Mixed obsessional thoughts and acts  Social anxiety disorder  Insomnia due to mental condition  Migraine without aura and without status migrainosus, not intractable    Both primary Dx of OCD and major depression are TR and marked.  Impaired function but less so with Spravato re: depression..   She has been receiving Spravato 84 mg weekly and moderate improvement in the depression..  she feels it also helps OCD somewhat.  However still easily overwhelmed with low stress tolerance.  Family contributes to her anxiety and stress markedly.  The OCD is improved with the increase in fluvoxamine and with Spravato.  Spends up to 2 hours daily and checking compulsions on her worst days but better when she travels.  No new options for tx are evident.  She has been on higher doses of fluvoxamine above the usual max of 400 mg daily in the past  and finds it more helpful at the higher dose.    Disc SE.   She is tolerating the meds well  Continue  Luvox back to 400 mg nightly as of January 2023. Disc dosing higher than usual.  She feels  this is increase has helped more with OCD which remains chronically severe.  Continue Auvelity BID and reduced Wellbutrin to 1 AM. It has helped and is tolerated.   We have discussed seizure risk that is possible using this combination but given severity of her symptoms she feels the risk is warranted. There are few other alternative medication options that remain.   Disc DDI issues.   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 She doesn't want to try this now.  We discussed the short-term risks associated with benzodiazepines including sedation and increased fall risk among others.  Discussed long-term side effect risk including dependence, potential withdrawal symptoms, and the potential eventual dose-related risk of dementia.  But recent studies from 2020 dispute this association between benzodiazepines and dementia risk. Newer studies in 2020 do not support an association with dementia. Disc this is high dose and not ideal.  Also disc risk combining it with temazepam. Rec try gradually reduce HS lorazepam to 1 mg Hs.  Can continue lorazepam 2 mg AM and 1 mg in afternoon bc of chronic anxiety and it is helpful and tolerated. She can continue temazepam 30 mg nightly.  She tends to have a lot of anxious negative thoughts at night when she is trying to go to bed.  She is trying to reduce the dose.  Complaining of HA and history migraine.  Asks for increase imitrex and disc preventatives like propranolol ER imitrex to 100 mg prn migraine and propranolol ER for migraine prevention.  Counseling 30 min on how strongly her family's behavior affects her mood.  Looked at some reasons for this and techniques to address her mood and response.  Disc mother and Brothers in particular Disc trasportation problems limiting access to [employer]. Also working on trying to develop social and spiritual goals.  No med changes  continue Auvelity 1 in AM & PM continue Wellbutrin XL 150 mg  AM Fluvoxamine 100 mg AM and 300 mg PM above usual max bc med necessary Lorazepam 2 mg HS and prn 1 mg BID prn anxiety daily Temazepam 30 mg HS Propranolol ER 60 mg daily Imitrex prn migraine  She wants to resume Spravato now bc dep worse without it.  FU 2-weeks   Meredith Staggers, MD, DFAPA  Please see After Visit Summary for patient specific instructions.  Future Appointments  Date Time Provider Department Center  01/16/2024  3:00 PM Cottle, Steva Ready., MD [facility] None  01/16/2024  3:00 PM CP-NURSE [facility] None  01/19/2024 11:45 AM Candelaria Stagers, DPM [facility]  01/23/2024 11:00 AM Cottle, Steva Ready., MD [facility] None  01/25/2024  1:00 PM Robley Fries, PhD [facility] None  02/08/2024  1:00 PM Robley Fries, PhD [facility] None  03/07/2024  1:00 PM Robley Fries, PhD [facility] None  03/27/2024  1:00 PM Robley Fries, PhD [facility] None  04/10/2024  1:00 PM Robley Fries, PhD [facility] None    No orders of the defined types were placed in this encounter.    -------------------------------

## 2024-01-16 ENCOUNTER — Ambulatory Visit: Admitting: Psychiatry

## 2024-01-16 ENCOUNTER — Ambulatory Visit

## 2024-01-16 ENCOUNTER — Encounter: Payer: Self-pay | Admitting: Psychiatry

## 2024-01-16 VITALS — BP 125/89 | HR 75

## 2024-01-16 DIAGNOSIS — F4010 Social phobia, unspecified: Secondary | ICD-10-CM | POA: Diagnosis not present

## 2024-01-16 DIAGNOSIS — Z636 Dependent relative needing care at home: Secondary | ICD-10-CM

## 2024-01-16 DIAGNOSIS — F339 Major depressive disorder, recurrent, unspecified: Secondary | ICD-10-CM

## 2024-01-16 DIAGNOSIS — F422 Mixed obsessional thoughts and acts: Secondary | ICD-10-CM | POA: Diagnosis not present

## 2024-01-16 DIAGNOSIS — Z638 Other specified problems related to primary support group: Secondary | ICD-10-CM | POA: Diagnosis not present

## 2024-01-16 DIAGNOSIS — G43009 Migraine without aura, not intractable, without status migrainosus: Secondary | ICD-10-CM

## 2024-01-16 NOTE — Progress Notes (Signed)
 NURSE NOTE:   Pt arrived for her #67 Spravato Treatment, she has been trying to come weekly for now. She started Spravato treatments on 10/07/2021, she continues with 84 mg (3 of the 28 mg) Spravato nasal spray for treatment resistance depression. Pt is being treated for Treatment Resistant Depression, Pt taken to treatment room. Pt's medication is charged through [employer].  Medication is stored behind 2 locked doors, it is never given to the pt until time of administration which is observed by the nurse. Disposed of per FDA/REMS regulations. All Spravato Treatments are documented in Spravato REMS per protocol of being a treatment center. Spravato is a CIII medication and has to be only given at a treatment facility and observed by nurse as pt administered intranasally   Pt's mobility is a bit slower than usual since her fall. She was directed to the treatment room to get vitals taken first. Initial vital signs are B/P at 3:10 PM 111/91, 81, SpO2 94%. Pt instructed to blow her nose and to recline back at 45 degrees. Pt given first nasal spray (28 mg) administered by pt observed by nurse. There were 5 minutes between each dose, total of 84 mg. Tolerated well. Assessed pt's 40 minute vital signs at 4:10 PM 117/87, 80, SpO2 95%.  Pt met with [name] and they discussed her care at the end of her treatment when her thoughts are clearer and her medication and moods. She does go to the bathroom at least once during her treatment. Dissociation resolved prior to discharge.  Discharge vitals at 5:20 PM 125/89, 75, SpO2 95%. Pt stable for discharge.  Pt was observed on site a total of 120 minutes per FDA/REMS requirements. Pt was with nurse for clinical assessment 50 minutes. She plans on returning next Tuesday.    LOT [id number] EXP OCT 2027

## 2024-01-16 NOTE — Progress Notes (Signed)
 Casey Diaz [id number] 1966/01/29 58 y.o.    Subjective:   Patient ID:  Casey Diaz is a 58 y.o. (DOB 1965-11-25) female.  Chief Complaint:  Chief Complaint  Patient presents with   Follow-up   Depression   Anxiety   Sleeping Problem     HPI Casey Diaz presents to the office today for follow-up of OCD and severe anxiety.     December 2019 visit the following was noted: No meds were changed. Lives in [name] and back for followup.  Sx are about the same.  Has to take meds with different sizes. Pt reports that mood is Anxious and Depressed and describes anxiety as Severe. Anxiety symptoms include: Excessive Worry, Obsessive Compulsive Symptoms:   Checking,,. Pt reports has interrupted sleep and nocturia. Pt reports that appetite is good. Pt reports that energy is no change and down slightly. Concentration is down slightly. Suicidal thoughts:  denied by patient. Loves the environment of [name] but misses some things there.  She's not able to work there.  H works there and likes it.  Struggled with not working, feels isolated and not up to task of meeting people.  Does attend a church and met a friend who's been helpful.  Leaving for [name] on 10/16/18.   04/09/2020 appointment the following is noted:  Staying another year in [name] bc Covid and other things. Last few months a lot of crying spells.  Is in menopause. Wonders about med changes though is nervous about it.  Crying spells associated with depressing thoughts more than stress or OCD.   Covid really hard on everyone and couldn't see family for 18 mos.  Family still very dysfunctional. No close friends in part due to OCD and depression. Son high Autism spectrum with ADHD and anxiety and she's with him all the time. Greater health problems with CP so more pains.   05/15/20 appt with the following noted: [name] for menopause and helps some. Still depressed.  Chronically. In [place] for 2 more weeks then to  [name] for another year. A lot of stressors lately triggering more checking and anxiety.   OCD is her CC now and seems.  Got worse DT stress.   Stressed with Asberger's son and her health.  H works a lot.  Her FOO still stress. Plan: Trintellix 10 mg 1 tablet in the morning with food and reduce fluvoxamine to 5 tablets nightly for 1 week  then reduce it to 4 tablets nightly.   07/02/20 appt with the following noted: Decided not to get Trintellix bc difficulty getting it. It is available.  There.  Wants to start it now.   Both depression and OCD are severe.  Not suicidal in intent or plan. Did not take samples with her to [name] but will be back in December. covid is worse there and travel is difficult.  Wants to reduce Wellbutrin DT dry mouth. Plan: She's afraid to reduce Luvox at this time DT fear of worsening OCD.  But will consider. Trintellix 10 mg 1 tablet in the morning with food and reduce fluvoxamine to 5 tablets nightly for 1 week  then reduce it to 4 tablets nightly. Also reduce Wellbutrin XL to 300 mg daily.    9-13 2022 appointment with the following noted: Back in [place] since July 14.  Broke arm a month ago and surgery.  It's all been rough adjustment.   B has cancer on his face and M fell taking him to the doctor.  Misses the water and weather of [name].   Cry a lot more since menopause. Still depression and anxiety and OCD.  Asks about ketamine. On Wellbutrin 300, Luvox 300.  No Trintellix. Added Ativan 2 mg AM and HS and it helps.  More likely to get upset at night. Plan: Increase Luvox back to 400 mg daily.  She thinks she's worse on less. Continue Wellbutrin XL to 300 mg daily. 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  She tolerated it well without unusual nausea or vomiting headache or other somatic symptoms.  She did have the expected dissociation which gradually  resolved over the course of the 2-hour period of observation.  She was a little concerned about her balance given her cerebral palsy but has not noted unusual or unexpected problems.  She is motivated to can continue Spravato in hopes of reducing her depressive symptoms. 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  She also has treatment resistant OCD which is partially managed with medications but is still quite disabling.  She is tolerating the medications well.  She is sleeping adequately.  Her appetite is adequate.  She is not having suicidal thoughts.  She continues to wish for a better treatment for OCD that would give her some relief.  09/30/2021 appointment with the following noted: She received her first dose of Spravato 84 mg intranasally today.  She tolerated it well without unusual nausea, vomiting, or other somatic symptoms.  Dissociation as expected did occur and gradually resolved over the 2-hour period of observation.  She did have a mild headache today with the treatment and received ibuprofen 600 mg at her request.  We will follow this to see if it is a pattern Patient is still depressed.  She said she was late with her medicine today and today was a particularly depressing day.  However she notes that the Spravato has lifted her mood considerably even today.  She is hopeful that it will continue to be helpful.  No suicidal thoughts.  She has ongoing chronic anxiety and OCD at baseline.  10/04/21 appt noted: Patient received Spravato 84 mg for the second time today.  She tolerated it well without any unusual headache, nausea or vomiting or other somatic symptoms.  Dissociation did occur and she gradually [place] resolution over the 2-hour period of observation. She did not have any unusual problems after she left the office last Spravato administration.  She did not have any specific problems with balance or walking.  She is at increased risk of that  difficulty because of cerebral palsy.  So far she has not noticed much mood effect from the medication beyond the first day of receiving it.  However she would like to continue Spravato in hopes of getting the antidepressant effect that is desired. Stress dealing with mother's behavior at party pt hosted.  Guilt over it.  10/07/2021 appointment noted: Patient received Spravato 84 mg for the second time today.  She tolerated it well without any unusual headache, nausea or vomiting or other somatic symptoms.  Dissociation did occur and she gradually [place] resolution over the 2-hour period of observation. She still is not sure about the antidepressant effect of Spravato.  Events over the holidays and demands, make it difficult to assess.  She still notes that the OCD tends to worsen the depression and vice versa.  She tolerates the  Spravato well and wants to continue the trial.  10/15/2021 appointment with the following noted: Patient received Spravato 84 mg for the second time today.  She tolerated it well without any unusual headache, nausea or vomiting or other somatic symptoms.  Dissociation did occur and she gradually [place] resolution over the 2-hour period of observation. Patient says it was somewhat difficult to evaluate the effect of the Spravato.  It was scheduled to be twice weekly for 4 weeks consecutively but the holidays have interfered with that administration.  She asked what specifically should be she should be looking for in order to assess improvement.  That was discussed.  The OCD is unchanged and the depression so far is not significantly different.  She still tolerates meds.  There have been no recent med changes  10/19/2021 appt noted: Patient received Spravato 84 mg for the second today.  She tolerated it well without any unusual headache, nausea or vomiting or other somatic symptoms.  Dissociation did occur and she gradually saw resolution over the 2-hour period of observation.    10/21/2021 appointment noted: Patient received Spravato 84 mg today.  She tolerated it well without any unusual headache, nausea or vomiting or other somatic symptoms.  Dissociation did occur and she gradually saw resolution over the 2-hour period of observation.  She feels better than last week.  She is not as depressed and down.  She is still dealing with grief around the death of her cousin that was unexpected.  It is still difficult to tell how much the Spravato was doing but she is hopeful.  Anxiety is still present with the OCD.  She is not having suicidal thoughts.  She is not hopeless.  She wants to continue treatment.  10/25/2021 appointment with the following noted: Patient received Spravato 84 mg today.  She tolerated it well without any unusual headache, nausea or vomiting or other somatic symptoms.  Dissociation did occur and she gradually saw resolution over the 2-hour period of observation.  She does not typically find the dissociation very strong. She is beginning to think the Spravato is helping somewhat with the depression.  It has been difficult to tell with the holidays intervening as well as the death of her cousin.  She has not been able to get Spravato twice weekly for 4 weeks straight as typically planned.  However she is hopeful.  The OCD remains significant.  She still has a tendency to think very negatively.  She is not suicidal.  10/28/2021 appointment with the following noted: Patient received Spravato 84 mg today.  She tolerated it well without any unusual headache, nausea or vomiting or other somatic symptoms.  Dissociation did occur and she gradually saw resolution over the 2-hour period of observation.  She does not typically find the dissociation very strong. She is feeling more hopeful about the administration of Spravato.  She is having less depression she believes.  Still not dramatically different.  She still has a tendency to have a lot of anxiety and rumination and  OCD.  She is not suicidal.  She is eager to continue the Spravato.  11/01/2021 appointment with the following noted: Patient received Spravato 84 mg today.  She tolerated it well without any unusual headache, nausea or vomiting or other somatic symptoms.  Dissociation did occur and she gradually saw resolution over the 2-hour period of observation.  She does not typically find the dissociation very strong. She is continuing to see a little bit of improvement in depression  with Spravato.  The anxiety remains but may be not as severe.  The OCD remains markedly severe chronically.  She is not suicidal.  She is encouraged by the degree of improvement with Spravato and inability to enjoy things more and not be quite as ruminative.  11/04/2021 appt noted: Patient received Spravato 84 mg today.  She tolerated it well without any unusual headache, nausea or vomiting or other somatic symptoms.  Dissociation did occur and she gradually saw resolution over the 2-hour period of observation.  She does not typically find the dissociation very strong. No SE complaints with meds. She continues to feel hopeful about the Spravato.  She has less depression.  Because of a number of factors she is uncertain of the full benefit but thinks she is somewhat less depressed.  Her anxiety and OCD remain significant but a little better.  She is tolerating the medications and does not desire medicine change.  She is not currently complaining of insomnia.   11/08/2021 appointment the following noted: Patient received Spravato 84 mg today.  She tolerated it well without any unusual headache, nausea or vomiting or other somatic symptoms.  Dissociation did occur and she gradually saw resolution over the 2-hour period of observation.  She does not typically find the dissociation very strong. No SE complaints with meds. She feels the Spravato is helping somewhat.  She would like to see a greater effect.  However she is able to enjoy things.   She is productive at home.  She would like to see a lifting of a degree of sadness that remains.  The anxiety and OCD remained largely unchanged.  She wondered about the dosing of Wellbutrin 300 mg a day and Luvox 300 mg a day and possible increases.  She has been at higher doses in the past.  She plans to start water therapy for her weakness and for her shoulder.  11/11/2021 appointment with the following noted: Patient received Spravato 84 mg today.  She tolerated it well without any unusual headache, nausea or vomiting or other somatic symptoms.  Dissociation did occur and she gradually saw resolution over the 2-hour period of observation.  She does not typically find the dissociation very strong. No SE complaints with meds. She feels the Spravato is clearly helping the depression.  She would like to see a more significant effect.  She is still having trouble thinking positive. Her energy is fair.  Concentration is good except for the problem with chronic obsessions. She has been taking Wellbutrin 300 mg in Luvox 300 mg for quite some time but has taken higher doses in the past.  We discussed that.  She would like to try higher doses in order to get a better effect if possible. We just increased the doses a couple of days ago.  No effect yet.  11/15/2021 appointment with the following noted: Patient received Spravato 84 mg today.  She tolerated it well without any unusual headache, nausea or vomiting or other somatic symptoms.  Dissociation did occur and she gradually saw resolution over the 2-hour period of observation.  She does not typically find the dissociation very strong. No SE complaints with meds. The patient is now convinced that the Spravato is helping the depression.  She would like to continue twice weekly Spravato this week if possible.  She has tolerated the increase in Wellbutrin to 450 mg daily and the increase and fluvoxamine to 400 mg daily without complications thus far.  The OCD  and anxiety  feed the depression to some extent. She spends approximately 2 hours daily with checking compulsions due to obsessions about causing harm to others.  For example fearing that when she has hit a pot hole that she may have hit a person and going back to check.  Checking corners and rooms out of fear that she may have harmed someone.  Other various checking compulsions.  She is hoping the increase in fluvoxamine to 400 mg will reduce that over the weeks to come.  She is not seeing a significant difference with the addition of the Spravato though she understands that was not expected.  She is more productive at home and more motivated and able to enjoy things more fully as a result of the Spravato treatment.  She is tolerating the medication  11/18/2021 appointment with the following noted: Patient received Spravato 84 mg today.  She tolerated it well without any unusual headache, nausea or vomiting or other somatic symptoms.  Dissociation did occur and she gradually saw resolution over the 2-hour period of observation.  She does not typically find the dissociation very strong. No SE complaints with meds. She clearly believes the Spravato has been helpful for the depression.  She wonders whether to continue to treatments weekly or to cut back to 1 weekly.  She would like to continue twice weekly in hopes of getting additional improvement in the depression because it is not resolved but it is difficult to get here twice a week in terms of arranging rides. She is recently increased Wellbutrin XL to 450 mg daily and fluvoxamine to 400 mg daily but they have not had time to have an official effect.  She is tolerating that well.  She is tolerating meds overwork overall well. The OCD remains the same as noted on 11/15/2021  11/25/21 appt noted: Patient received Spravato 84 mg today.  She tolerated it well without any unusual headache, nausea or vomiting or other somatic symptoms.  Dissociation did occur and  she gradually saw resolution over the 2-hour period of observation.  She does not typically find the dissociation very strong. No SE complaints with meds. She thinks the increase in Wellbutrin and Luvox have been potentially helpful for depression and OCD respectively.  It has been too early to see the full effect.  She is sleeping and eating well.  She is functioning at home.  She still spends a lot of time that is about 2 hours a day dealing with compulsive behaviors.  12/02/21 appt noted: Patient received Spravato 84 mg today.  She tolerated it well without any unusual headache, nausea or vomiting or other somatic symptoms.  Dissociation did occur and she gradually saw resolution over the 2-hour period of observation.  She does not typically find the dissociation very strong. No SE complaints with meds. Several losses and stressors recently that affect her sense of mood. However still sees significant benefit from the Spravato for her depression.  Wants to continue it. Suspect early  some benefit from the increased Wellbutrin for depression and Luvox for OCD. Tolerating meds. No complaints about the meds. Sleeping and eating well.  No new health concerns.  12/09/21 appt noted: Patient received Spravato 84 mg today.  She tolerated it well without any unusual headache, nausea or vomiting or other somatic symptoms.  Dissociation did occur and she gradually saw resolution over the 2-hour period of observation.  She does not typically find the dissociation very strong. No SE complaints with meds. Seeing noticeable improvement from  increase fluvoxamine to 400 mg daily.  Tolerating meds without concerns over them. Depression is stable with residual sx of easy guilt and easily stressed.  OCD contributes to depression but depression is not severe with less crying spells.  Productive at home with chores.  Enjoyed recent birthday.  Sleeping good. No new concerns.  12/23/2021 appointment noted: Patient  received Spravato 84 mg today.  She tolerated it well without any unusual headache, nausea or vomiting or other somatic symptoms.  Dissociation did occur and she gradually saw resolution over the 2-hour period of observation.  She does not typically find the dissociation very strong. No SE complaints with meds. Seeing noticeable improvement from increase fluvoxamine to 400 mg daily.  Tolerating meds without concerns over them. Her depression is somewhat improved with the Spravato.  She also feels generally a little lighter.  She is more motivated.  She is less overwhelmed by guilt.  The OCD is gradually improving but is still quite time-consuming as noted before.  She is sleeping well.  No side effects  12/30/2021 appointment with the following noted: Patient received Spravato 84 mg today.  She tolerated it well without any unusual headache, nausea or vomiting or other somatic symptoms.  Dissociation did occur and she gradually saw resolution over the 2-hour period of observation.  She does not typically find the dissociation very strong. No SE complaints with meds. Seeing noticeable improvement from increase fluvoxamine to 400 mg daily.  Tolerating meds without concerns over them. She is confident of her the improvement seen with Spravato.  She is less hopeless.  Guilt is marked remarkably improved.  She is not having any thoughts of death or dying.  She is more motivated for activities such as exercise which she is recently started.  She is sleeping well. The OCD remains severe but it is improving somewhat with the increase in fluvoxamine.  It is still consuming a couple hours per day.  01/10/22 apravato 84 admin  01/24/22 appt noted: Patient received Spravato 84 mg today.  She tolerated it well without any unusual headache, nausea or vomiting or other somatic symptoms.  Dissociation did occur and she gradually saw resolution over the 2-hour period of observation.  She does not typically find the  dissociation very strong. No SE complaints with meds. Very tearful today.  Feels like she has been suppressing emotion in the Spravato caused it to be released.  Discussed some stressors.  Overall still feels the medicine is helpful.  She has missed some of the scheduled Spravato treatments that were intended to be weekly due to circumstances beyond her control.  She is still struggling with OCD as previously noted but does believe the medications are helpful. Plan no med changes  01/31/2022 received Spravato 84 mg today  02/09/2022 appointment with the following noted: Patient received Spravato 84 mg today.  She tolerated it well without any unusual headache, nausea or vomiting or other somatic symptoms.  Dissociation did occur and she gradually saw resolution over the 2-hour period of observation.  She does not typically find the dissociation very strong. No SE complaints with meds. Spravato clearly helps depression and OCD but easily gets overwhelmed and tearful with fairly routine stressors.  Tolerating meds. Sleep and appetite is OK Asks to increase lorazepam to 2 mg AM and HS and 1mg  afternoon  02/16/22 appt noted: Patient received Spravato 84 mg today.  She tolerated it well without any unusual headache, nausea or vomiting or other somatic symptoms.  Dissociation did  occur and she gradually saw resolution over the 2-hour period of observation.  She does not typically find the dissociation very strong. No SE complaints with meds. She has chronic depesssion and OCD but is improved with Spravato, both dx versus before.  She has continued Luvox 400 mg and Wellbutrin 450 mg and is tolerating it.  Chronically easily stressed.  Tolerating all meds.  Doesn't like taking more meds.  Spending a couple hours daily with OCD.  No SI No med changes.  02/21/22 appt noted:   Doesn't like taking more meds.  Spending a couple hours daily with OCD.  No SI No med changes.  02/21/22 appt noted: Patient received  Spravato 84 mg today.  She tolerated it well without any unusual headache, nausea or vomiting or other somatic symptoms.  Dissociation did occur and she gradually saw resolution over the 2-hour period of observation.  She does not typically find the dissociation very strong. No SE complaints with meds. She has chronic depesssion and OCD but is improved with Spravato, both dx versus before.  She has continued Luvox 400 mg and Wellbutrin 450 mg and is tolerating it.  Chronically easily overwhelmed and doesn't know why.  Tolerating all meds. Wants to continue meds.  03/16/22 appt noted: Patient received Spravato 84 mg today.  She tolerated it well without any unusual headache, nausea or vomiting or other somatic symptoms.  Dissociation did occur and she gradually saw resolution over the 2-hour period of observation.  She does not typically find the dissociation very strong. No SE complaints with meds. Overall she still feels the Spravato has been helpful not only for her depression but also for her OCD which was somewhat unexpected.  OCD is still significant but it is less severe than prior to starting Spravato.  She is tolerating Luvox 400 mg and Wellbutrin 450 mg.  We discussed possible med adjustments.  03/23/22 appt noted: Patient received Spravato 84 mg today.  She tolerated it well without any unusual headache, nausea or vomiting or other somatic symptoms.  Dissociation did occur and she gradually saw resolution over the 2-hour period of observation.  She does not typically find the dissociation very strong. No SE complaints with meds. She is still depressed and still has OCD of course but is improved with the Spravato.  She is tolerating the medications well.  We had previously discussed the possibility of switching some of the Wellbutrin to [facility] and she is very interested in that in hopes of further improvement in depression and OCD.  She understands that Auvelity is not used for OCD on the label.   She is tolerating the medications.  She is still easily overwhelmed.  She is sleeping and eating okay.. Plan: Reduce Wellbutrin XL to 300 mg AM and add Auvelity 1 tablet each AM  03/30/22 appt noted: Patient received Spravato 84 mg today.  She tolerated it well without any unusual headache, nausea or vomiting or other somatic symptoms.  Dissociation did occur and she gradually saw resolution over the 2-hour period of observation.  She does not typically find the dissociation very strong. No SE complaints with meds. She is still depressed and still has OCD of course but is improved with the Spravato.  She is tolerating the medications well.  No difference with Auvelity 1 AM so far and no SE.  Going on vacation on Saturday. 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 Sleep and appetite good. Plan: Increase Auvelity to 1 twice daily and reduce Wellbutrin  to XL 150 every morning  04/14/2022 appointment with the following noted: Patient received Spravato 84 mg today.  She tolerated it well without any unusual headache, nausea or vomiting or other somatic symptoms.  Dissociation did occur and she gradually saw resolution over the 2-hour period of observation.  She does not typically find the dissociation very strong. No SE complaints with meds. She is still depressed and still has OCD of course but is improved with the Spravato.  She is tolerating the medications well.  Just increased Auvelity to BID yesterday and reduced Wellbutrin to 150 AM. No SE so far.  No change in mood or anxiety so far.  Chronic OCD as noted and residual depression and chronic fatigue.  04/21/2022 appointment with the following noted: Patient received Spravato 84 mg today.  She tolerated it well without any unusual headache, nausea or vomiting or other somatic symptoms.  Dissociation did occur and she gradually saw resolution over the 2-hour period of observation.  She does not typically find the dissociation very strong. No  SE complaints with meds. She is still depressed and still has OCD of course but is improved with the Spravato.   She has questions about the dosing of lorazepam. She tends to have negative anxious thoughts at night.  This tends to interfere with her ability to go to sleep.  She is getting about 8 to 9 hours of sleep.  She is tolerating the meds without excessive sedation and does not nap during the day.  05/06/22 appt noted: Patient received Spravato 84 mg today.  She tolerated it well without any unusual headache, nausea or vomiting or other somatic symptoms.  Dissociation did occur and she gradually saw resolution over the 2-hour period of observation.  She does not typically find the dissociation very strong. No SE complaints with meds. She is still depressed and still has OCD of course but is improved with the Spravato.   Had some questions about timing of dosing of fluvoxamine and Auvelity. OCD is not quite as time consuming.  Sleep and eating are the same.   No SE meds.  05/25/22 appt noted: Patient received Spravato 84 mg today.  She tolerated it well without any unusual headache, nausea or vomiting or other somatic symptoms.  Dissociation did occur and she gradually saw resolution over the 2-hour period of observation.  She does not typically find the dissociation very strong. No SE complaints with meds. She is still depressed and still has OCD of course but is improved with the Spravato.   She has less OCD when away from home and on vacation of note. Plan: Rec gradually reduce HS lorazepam to 1 mg Hs.  Can continue lorazepam 2 mg AM and 1 mg in afternoon bc of chronic anxiety and it is helpful and tolerated. She can continue temazepam 30 mg nightly.  She tends to have a lot of anxious negative thoughts at night when she is trying to go to bed  06/16/22 appt noted: Patient received Spravato 84 mg today.  She tolerated it well without any unusual headache, nausea or vomiting or other somatic  symptoms.  Dissociation did occur and she gradually saw resolution over the 2-hour period of observation.  She does not typically find the dissociation very strong. No SE complaints with meds. She is still depressed and still has OCD of course but is improved with the Spravato.   She has less OCD when away from home and on vacation of note. She is tolerating  the medications.  She has continued current medications. 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  She still receives benefit from [place].  Her depression and OCD are better with the Spravato.  She thinks she is getting additional benefit with the switch from Wellbutrin to [place].  07/04/2022 appointment noted: Reports she developed a rash on her face from [facility] and feels like she is allergic to it.  She stopped it and went back to Wellbutrin 450 mg every morning.  The rash has cleared up.  She did not require any medical attention and did not have shortness of breath. Overall her depression and OCD are about the same as they have been.  She did not notice a substantial difference from the brief treatment with Auvelity but she understands she did not take a full course.  She is tolerating the current medicines well. Current meds fluvoxamine 400 mg daily, Wellbutrin XL 450 mg daily, lorazepam 1 to 2 mg in the morning and 1 to 2 mg at night and 1 mg in the afternoon, temazepam 30 mg nightly. She wants to continue the Spravato because she feels it has been helpful for both her depression and her racing OCD  07/18/22 appt noted: Patient received Spravato 84 mg today.  She tolerated it well without any unusual headache, nausea or vomiting or other somatic  symptoms.  Dissociation did occur and she gradually saw resolution over the 2-hour period of observation.  She does not typically find the dissociation very strong. No SE complaints with meds. She is still depressed and still has OCD of course but is improved with the Spravato.  Rash better off Auvelity and back on Welllbutrin XL 450 mg AM, fluvoxamine 400 mg daily.  08/15/22 appt noted: Current psych meds: Wellbutrin XL 450 mg AM, fluvoxamine 100 mg in AM and 300 mg HS, lorazepam 1 mg 1-2 mg in the AM and HS and 1 tablet prn midday for anxiety, temazepam 30 mg HS Patient received Spravato 84 mg today.  She tolerated it well without any unusual headache, nausea or vomiting or other somatic symptoms.  Dissociation did occur and she gradually saw resolution over the 2-hour period of observation.  She does not typically find the dissociation very strong. No SE complaints with meds. She has a great deal of stress dealing with her family.  Disc brother's ongoing mania and difficulty getting him help and the stress he causes for the family. She wants to continue Spravato through this very stressful holdicay season and reevaluate the frequency after the New Year.  09/12/22 appt noted: Current psych meds: Wellbutrin XL 450 mg AM, fluvoxamine 100 mg in AM and 300 mg HS, lorazepam 1 mg 1-2 mg in the AM and HS and 1 tablet prn midday for anxiety, temazepam 30 mg HS Patient received Spravato 84 mg today.  She tolerated it well without any unusual headache, nausea or vomiting or other somatic symptoms.  Dissociation did occur and she gradually saw resolution over the 2-hour period of observation.  She does not typically find the dissociation very strong. No SE complaints with meds. She has a great deal of stress dealing with her family.  Disc brother's  ongoing mania and difficulty getting him help and the stress he causes for the family. She wants to continue Spravato through this very stressful holdicay season  and reevaluate the frequency after the New Year.  Chronically easily overwhelmed with family. Complaining of HA and history migraine.  Asks for increase imitrex and disc preventatives like propranolol ER  09/26/22 appt noted: Current psych meds: Wellbutrin XL 450 mg AM, fluvoxamine 100 mg in AM and 300 mg HS, lorazepam 1 mg 1-2 mg in the AM and HS and 1 tablet prn midday for anxiety, temazepam 30 mg HS Patient received Spravato 84 mg today.  She tolerated it well without any unusual headache, nausea or vomiting or other somatic symptoms.  Dissociation did occur and she gradually saw resolution over the 2-hour period of observation.  She does not typically find the dissociation very strong. No SE complaints with meds. She has a great deal of stress dealing with her family.  This is ongoing The holidays are much more stressful DT family problems.  She is noting OCD is much worse over the last couple of week.  Depression is better with Spravato. Needed higher dose meds for migraine.   10/03/22 appt noted: Current psych meds: Wellbutrin XL 450 mg AM, fluvoxamine 100 mg in AM and 300 mg HS, lorazepam 1 mg 1-2 mg in the AM and HS and 1 tablet prn midday for anxiety, temazepam 30 mg HS Patient received Spravato 84 mg today.  She tolerated it well without any unusual headache, nausea or vomiting or other somatic symptoms.  Dissociation did occur and she gradually saw resolution over the 2-hour period of observation.  She does not typically find the dissociation very strong. No SE complaints with meds. She has now realized that the rash she previously previously attributed to [facility] was not related.  She is interested may be retrying that after the holidays.  She is tolerating medications otherwise. The holidays remain chronically stressful to her due to family dynamic problems which cause her to consistently feel stuck.  Under more stress her OCD is worse.  She will have a tendency to have crying spells.   The depression and OCD are still improved with Spravato as compared to before.  11/15/22 appt noted: Current psych meds: Wellbutrin XL 450 mg AM, fluvoxamine 100 mg in AM and 300 mg HS, lorazepam 1 mg 1-2 mg in the AM and HS and 1 tablet prn midday for anxiety, temazepam 30 mg HS Patient received Spravato 84 mg today.  She tolerated it well without any unusual headache, nausea or vomiting or other somatic symptoms.  Dissociation did occur and she gradually saw resolution over the 2-hour period of observation.  She does not typically find the dissociation very strong. No SE complaints with meds. Continues to feel depressed and overwhelmed by family problems including her brother's mania and recent eviction and commitment.  Chronic OCD worse when stressed.  No SI.  Tolerating meds. Plan: Per her request continue Wellbutrin XL 450 mg every morning. She has come to the realization that the rash she had previously attributed to [facility] was not related.  She is interested in perhaps retrying Auvelity. There are few alternative medication options that remain.  01/11/23 appt noted: Current psych meds: Wellbutrin XL 150 mg BID and started Auvelity 1 AM, fluvoxamine 100 mg in AM and 300 mg HS, lorazepam 1 mg 1-2 mg in the AM and HS and 1 tablet prn midday for anxiety, temazepam 30 mg HS Patient received  Spravato 84 mg today.  She tolerated it well without any unusual headache, nausea or vomiting or other somatic symptoms.  Dissociation did occur and she gradually saw resolution over the 2-hour period of observation.  She does not typically find the dissociation very strong. No SE complaints with meds. Trouble getting to sessions lately DT transportation problems.   Continues to feel depressed and overwhelmed by OCD and family.  Feels she needs toevery other week Spravato bc it helps for a couple of weeks and then seems to wear off.  Struggleing with OCD and depression both of which are eased by Spravato. Less  crying with Auvelity.  02/07/23 appt noted: Current psych meds: Wellbutrin XL 150 mg BID and started Auvelity 1 AM, fluvoxamine 100 mg in AM and 300 mg HS, lorazepam 1 mg 1-2 mg in the AM and HS and 1 tablet prn midday for anxiety, temazepam 30 mg HS Patient received Spravato 84 mg today.  She tolerated it well without any unusual headache, nausea or vomiting or other somatic symptoms.  Dissociation did occur and she gradually saw resolution over the 2-hour period of observation.  She does not typically find the dissociation very strong. No SE complaints with meds. Trouble getting to sessions lately DT transportation problems.  This is a problem ongoing and thinks she might need to pause Spravato bc won't be able to get her for at least 3 weeks. She is holding pretty steady with a moderate level of anxiety and depression ongoing and chronic.    03/20/23 appt noted: Current psych meds: Wellbutrin XL 150 mg BID and started Auvelity 1 AM, fluvoxamine 100 mg in AM and 300 mg HS, lorazepam 1 mg 1-2 mg in the AM and HS and 1 tablet prn midday for anxiety, temazepam 30 mg HS Patient received Spravato 84 mg today.  She tolerated it well without any unusual headache, nausea or vomiting or other somatic symptoms.  Dissociation did occur and she gradually saw resolution over the 2-hour period of observation.  She does not typically find the dissociation very strong. No SE complaints with meds. She is trying to get back into more regular Spravato administration.  Family issues and transportation problems that led to her missing Spravato.  She feels more depressed without regular Spravato.  Her OCD is chronic but also worse when she misses Spravato.  She does not want any medication changes.  04/03/23 appt noted: Current psych meds: Wellbutrin XL 150 mg BID and started Auvelity 1 AM, fluvoxamine 100 mg in AM and 300 mg HS, lorazepam 1 mg 1-2 mg in the AM and HS and 1 tablet prn midday for anxiety, temazepam 30 mg  HS Patient received Spravato 84 mg today.  She tolerated it well without any unusual headache, nausea or vomiting or other somatic symptoms.  Dissociation did occur and she gradually saw resolution over the 2-hour period of observation.  She does not typically find the dissociation very strong.  It gets rid of negative emotion for awhile after procedure and would like it to last longer.   No SE complaints with meds.  Doesn't really want med changes.   Planning to weekly Spravato.  It is helping dep and anxiety.    04/10/23 appt noted: Current psych meds: Wellbutrin XL 150 mg BID and started Auvelity 1 AM, fluvoxamine 100 mg in AM and 300 mg HS, lorazepam 1 mg 1-2 mg in the AM and HS and 1 tablet prn midday for anxiety, temazepam 30 mg HS Patient  received Spravato 84 mg today.  She tolerated it well without any unusual headache, nausea or vomiting or other somatic symptoms.  Dissociation did occur and she gradually saw resolution over the 2-hour period of observation.  She does not typically find the dissociation very strong.  It gets rid of negative emotion for awhile after procedure and would like it to last longer.   No SE complaints with meds.  Doesn't really want med changes.   Had lidocaine shot for back pain with brief benefit.  Doing some water based PT Spravato went well today. Got pretty upset last week with event related to son's activities.  Got upset with son saying something inappropriate publicly.  Was so embarrassed.  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    He's kind of rebellious lately.  Son is 27 yo.    05/03/23 appt noted: Current psych meds: Wellbutrin XL 150 mg BID and started Auvelity 1 AM, fluvoxamine 100 mg in AM and 300 mg HS, lorazepam 1 mg 1-2 mg in the AM and HS and 1 tablet prn midday for anxiety, temazepam 30 mg HS No SE Patient received Spravato 84 mg today.  She tolerated it well without any unusual headache, nausea or  vomiting or other somatic symptoms.  Dissociation did occur and she gradually saw resolution over the 2-hour period of observation.  She does not typically find the dissociation very strong.  It gets rid of negative emotion for awhile after procedure and would like it to last longer.   Still pleased with benefit from [facility] for mood and anxiety. No SE complaints with meds.  Doesn't really want med changes.   Chronic family px ongoing affects her but nothing she can change.H sick of the family drama.   Continuing counseling helps.  Has some support. Mood and anxiety pretty steady but did go on vacation to [place].  Anxiety worse first in AM and then later at night with mind racing on stressors.   Still back trouble.  05/23/23 appt noted: Current psych meds: Wellbutrin XL 150 mg BID and started Auvelity 1 AM, fluvoxamine 100 mg in AM and 300 mg HS, lorazepam 1 mg 1-2 mg in the AM and HS and 1 tablet prn midday for anxiety, temazepam 30 mg HS No SE Patient received Spravato 84 mg today.  She tolerated it well without any unusual headache, nausea or vomiting or other somatic symptoms.  Dissociation did occur and she gradually saw resolution over the 2-hour period of observation.  She does not typically find the dissociation very strong.  It gets rid of negative emotion for awhile after procedure and would like it to last longer.   Still pleased with benefit from [facility] for mood and anxiety by 50%. No SE complaints with meds. Chronic family stress interferes with mental health and self care.  May have to reduce frequency of Spravato bc of this. But is status quo with meds.  Chronic residual OCD which is mod severe and dep moderate.  05/30/23 appt noted: Current psych meds: Wellbutrin XL 150 mg BID and started Auvelity 1 AM, fluvoxamine 100 mg in AM and 300 mg HS, lorazepam 1 mg 1-2 mg in the AM and HS and 1 tablet prn midday for anxiety, temazepam 30 mg HS No SE Patient received Spravato 84 mg  today.  She tolerated it well without any unusual headache, nausea or vomiting or other somatic symptoms.  Dissociation did  occur and she gradually saw resolution over the 2-hour period of observation.  She does not typically find the dissociation very strong.  It gets rid of negative emotion for awhile after procedure and would like it to last longer.   Still pleased with benefit from [facility] for mood and anxiety by 50% or better but not resolved. She wants to continue Spravato as frequently as schedule will allow. Both depression and OCD are better with most improvement in mood.  Asks about any new treatments for OCD. No SE complaints with meds. Chronic family stress interferes with mental health and self care.  This is an ongoing drain on her mood and resources emotionally.    06/06/23 appt noted: Current psych meds: Wellbutrin XL 150 mg BID and started Auvelity 1 AM, fluvoxamine 100 mg in AM and 300 mg HS, lorazepam 1 mg 1-2 mg in the AM and HS and 1 tablet prn midday for anxiety, temazepam 30 mg HS No SE Patient received Spravato 84 mg today.  She tolerated it well without any unusual headache, nausea or vomiting or other somatic symptoms.  Dissociation did occur and she gradually saw resolution over the 2-hour period of observation.  She does not typically find the dissociation very strong.  It gets rid of negative emotion for awhile after procedure and would like it to last longer.   Still believes each meds work and are helpful and well tolerated.  Specifically she thinks that the Auvelity adds additional benefit on taking Wellbutrin.  She believes the meds help both depression and OCD though the OCD remains a problem.  She also believes Spravato helps both types of symptoms as well.  Her mood is variable.  She experiences a great deal of stress from her family and those problems wax and wane.  She has bad days because of it and today is 1 of those days.  She is continuing counseling and that is  helpful.  Due to family demands she may have to spread out the Spravato frequency.  06/16/23 appt noted:  Current psych meds: Wellbutrin XL 150 mg BID and started Auvelity 1 AM, fluvoxamine 100 mg in AM and 300 mg HS, lorazepam 1 mg 1-2 mg in the AM and HS and 1 tablet prn midday for anxiety, temazepam 30 mg HS No SE Patient received Spravato 84 mg today.  She tolerated it well without any unusual headache, nausea or vomiting or other somatic symptoms.  Dissociation did occur and she gradually saw resolution over the 2-hour period of observation.  She does not typically find the dissociation very strong.  It gets rid of negative emotion for awhile after procedure . She is trying to make the Schedule work for [employer] oh every week or every other week because she is aware the treatment effects can be lost if it is time less frequently.  So she has been able to come the last 2 weeks, we will try to come in 2 weeks.  Spravato reduces depression and OCD significantly though she remains highly symptomatic with both.  However she has no suicidal thoughts.  Crying spells are reduced with Spravato.  She still spends a fair amount of time meaning over an hour a day checking and more under stress or when tired.  She asks about any new meds for OCD. Plan: increase Auvelity 1 in AM & PM Hold Wellbutrin XL 150 mg BID  07/12/23 appt noted: Current psych meds: Wellbutrin XL 150 mg BID and started Auvelity 1 AM,  fluvoxamine 100 mg in AM and 300 mg HS, lorazepam 1 mg 1-2 mg in the AM and HS and 1 tablet prn midday for anxiety, temazepam 30 mg HS.  Didn't increase Auvelity yet No SE Patient received Spravato 84 mg today.  She tolerated it well without any unusual headache, nausea or vomiting or other somatic symptoms.  Dissociation did occur and she gradually saw resolution over the 2-hour period of observation.  She does not typically find the dissociation very strong.  It gets rid of negative emotion for awhile after  procedure . She has not increased the Auvelity yet is planned.  She has a little bit of nervousness about it but admits is not rational.  She is willing to give that a try to try to get better control of depression and anxiety.  She continues to see the benefit of Spravato every other week.  She is struggling with some pain due to plantar fasciitis and given her cerebral palsy that makes it even more difficult to walk and to engage in normal activities.  She is willing to seek treatment for that.  07/25/23 appt noted: Current psych meds: Wellbutrin XL 150 mg AM and started Auvelity 1 BID, fluvoxamine 100 mg in AM and 300 mg HS, lorazepam 1 mg 1-2 mg in the AM and HS and 1 tablet prn midday for anxiety, temazepam 30 mg HS.  Didn't increase Auvelity yet No SE Patient received Spravato 84 mg today.  She tolerated it well without any unusual headache, nausea or vomiting or other somatic symptoms.  Dissociation did occur and she gradually saw resolution over the 2-hour period of observation.  She does not typically find the dissociation very strong.  It gets rid of negative emotion for awhile after procedure . Chronic dep and anxiety to some extent.  But has increased Auvelity to BID recently and no SE yet.  Disc frequency Spravato bc benefit and will try to continue every other week and not less if possible. No further concerns for meds.  08/08/23 appt noted: Current psych meds: Wellbutrin XL 150 mg AM and Auvelity 1 BID, fluvoxamine 100 mg in AM and 300 mg HS, lorazepam 1 mg 1-2 mg in the AM and HS and 1 tablet prn midday for anxiety, temazepam 30 mg HS.  Didn't increase Auvelity yet No SE with med changes. Patient received Spravato 84 mg today.  She tolerated it well without any unusual headache, nausea or vomiting or other somatic symptoms.  Dissociation did occur and she gradually saw resolution over the 2-hour period of observation.  She does not typically find the dissociation very strong.  It gets rid  of negative emotion for awhile after procedure . Mornings still worse with dep and anxiety.  But mood and anxiety are better with the increase in Auvelity to BID.  OCD seems a little better.  Chronic difficulty handling the complaints and stress from her family.  Easily stresser her out.  No med changes desire. She will have to take a break from [facility] DT pending foot surgery.  Mobility and transportation will be limited.  08/25/23 appt noted: Current psych meds: Wellbutrin XL 150 mg AM and Auvelity 1 BID, fluvoxamine 100 mg in AM and 300 mg HS, lorazepam 1 mg 1-2 mg in the AM and HS and 1 tablet prn midday for anxiety, temazepam 30 mg HS.  Didn't increase Auvelity yet No SE with med changes. Patient received Spravato 84 mg today.  She tolerated it well without any  unusual headache, nausea or vomiting or other somatic symptoms.  Dissociation did occur and she gradually saw resolution over the 2-hour period of observation.  She does not typically find the dissociation very strong.  It gets rid of negative emotion for awhile after procedure . Pending foot surgery next week.  Able to work in [employer] this week which helps with dep and anxiety and OCD.  Struggles with sleep without multiple meds as noted.  No adverse effects.  Will return to Spravato asap after foot surgery  10/04/23 appt noted: Current psych meds: Wellbutrin XL 150 mg AM and Auvelity 1 BID, fluvoxamine 100 mg in AM and 300 mg HS, lorazepam 1 mg 1-2 mg in the AM and HS and 1 tablet prn midday for anxiety, temazepam 30 mg HS.  Didn't increase Auvelity yet No SE with med changes. Patient received Spravato 84 mg today.  She tolerated it well without any unusual headache, nausea or vomiting or other somatic symptoms.  Dissociation did occur and she gradually saw resolution over the 2-hour period of observation.  She does not typically find the dissociation very strong.  It gets rid of negative emotion for awhile after procedure . Mood and  anxiety stable.  Benefit with meds and Spravato.   She reports OCD time consumption typically is much better than in the past when it was routinely 2 hours daily.  Now less than have of that.    Chronic caregiver stress with dysfunctional family and has trouble with boundaries.  Working on that in therapy.  10/24/23 appt noted: Current psych meds: Wellbutrin XL 150 mg AM and Auvelity 1 BID, fluvoxamine 100 mg in AM and 300 mg HS, lorazepam 1 mg 1-2 mg in the AM and HS and 1 tablet prn midday for anxiety, temazepam 30 mg HS.  No SE with med changes. She plans to stop Spravato bc couldn't get adequate transportation.  Her mood is continuing to be highly reactive with what's going on with family esp mother and brother.  Mother self absorbed and can be mean yet demanding.   She doesn't fear getting worse if she stops Spravato though this has happened in the past.   Lately less dep though can be pulled down quickly by family of origin interactions.  Narcissistic mother.  Thinking of PT work to try to keep her mind more occupied. Plan no changes  01/15/24 appt noted: Current psych meds: Wellbutrin XL 150 mg AM and Auvelity 1 BID, fluvoxamine 100 mg in AM and 300 mg HS, lorazepam 1 mg 1-2 mg in the AM and HS and 1 tablet prn midday for anxiety, temazepam 30 mg HS. No SE with med changes. Patient received Spravato 84 mg today.  She tolerated it well without any unusual headache, nausea or vomiting or other somatic symptoms.  Dissociation did occur and she gradually saw resolution over the 2-hour period of observation.  She does not typically find the dissociation very strong.  It gets rid of negative emotion for awhile after procedure . She wanted to resume Spravato bc 30% benefit for dep and anxiety.  Chronic struggles with OCD and family stress and handles it better when on Spravato. No problems with meds and no desire for changes.  01/16/24 appt noted: Current psych meds: Wellbutrin XL 150 mg AM and  Auvelity 1 BID, fluvoxamine 100 mg in AM and 300 mg HS, lorazepam 1 mg 1-2 mg in the AM and HS and 1 tablet prn midday for anxiety, temazepam 30 mg  HS. No SE with med changes. Patient received Spravato 84 mg today.  She tolerated it well without any unusual headache, nausea or vomiting or other somatic symptoms.  Dissociation did occur and she gradually saw resolution over the 2-hour period of observation.  She does not typically find the dissociation very strong.  It gets rid of negative emotion for awhile after procedure . She wanted to resume Spravato bc 30% benefit for dep and anxiety.  Chronic struggles with OCD and family stress and handles it better when on Spravato. Asks about dropping out of the Wellbutrin.  Previous psych med trials include Prozac, paroxetine, sertraline, fluvoxamine, venlafaxine, Anafranil with no response,  Wellbutrin, Viibryd, Trintellix 10 1 month NR Auvelity BID   Geodon,  risperidone, Rexulti, Abilify,  Seroquel, Latuda 40 mg with irritability.   lamotrigine lithium,  BuSpar, Namenda,  pramipexole with no response, and Topamax, pindolol  2 brothers & 1 sister poverty, 1 B with untreated bipolar disorder lives with mother  ECT-MADRS    Flowsheet Row Office Visit from 06/29/2021 in [facility]  MADRS Total Score 36      Flowsheet Row Admission (Discharged) from 06/11/2021 in [facility]  C-SSRS RISK CATEGORY No Risk       Review of Systems:  Review of Systems  Constitutional:  Positive for fatigue.  Cardiovascular:  Negative for chest pain and palpitations.  Musculoskeletal:  Positive for arthralgias, back pain, gait problem, myalgias and neck pain.  Neurological:  Positive for weakness and headaches.  Psychiatric/Behavioral:  Positive for dysphoric mood and sleep disturbance. Negative for suicidal ideas. The patient is nervous/anxious.     Medications: I have reviewed the patient's current  medications.  Current Outpatient Medications  Medication Sig Dispense Refill   Abaloparatide (TYMLOS) 3120 MCG/1.56ML SOPN Inject into the skin.     AUVELITY 45-105 MG TBCR TAKE 1 TABLET BY MOUTH IN THE MORNING AND AT BEDTIME 60 tablet 1   AUVELITY 45-105 MG TBCR TAKE 1 TABLET BY MOUTH IN THE MORNING AND IN THE EVENING 60 tablet 0   Azelastine-Fluticasone 137-50 MCG/ACT SUSP Place 1-2 sprays into both nostrils daily.     baclofen (LIORESAL) 10 MG tablet Take 20 mg by mouth at bedtime as needed for muscle spasms.     buPROPion (WELLBUTRIN XL) 150 MG 24 hr tablet TAKE 1 TABLET (150 MG TOTAL) BY MOUTH IN THE MORNING 90 tablet 0   dicyclomine (BENTYL) 10 MG capsule Take 10 mg by mouth daily.     docusate sodium (COLACE) 100 MG capsule Take 1 capsule (100 mg total) by mouth 2 (two) times daily. (Patient taking differently: Take 100 mg by mouth daily.) 10 capsule 0   Esketamine HCl, 84 MG Dose, (SPRAVATO, 84 MG DOSE,) 28 MG/DEVICE SOPK USE 3 SPRAYS IN EACH NOSTRIL ONCE A WEEK 3 each 0   fexofenadine (ALLEGRA) 180 MG tablet Take 180 mg by mouth daily.     fluvoxaMINE (LUVOX) 100 MG tablet TAKE 1 TABLET IN THE AM AND 3 TABLETS AT NIGHT 120 tablet 2   hydrocortisone (ANUSOL-HC) 2.5 % rectal cream Place rectally 2 (two) times daily. x 7-14 days 30 g 0   ketotifen (ZADITOR) 0.025 % ophthalmic solution Place 3 drops into both eyes at bedtime.     LORazepam (ATIVAN) 1 MG tablet TAKE 1-2 IN AM,1-2 TABS EVERY NIGHT AT BEDTIME AND 1 TAB IN AFTERNOON WHEN NEEDED FOR ANXIETY/SLEEP 150 tablet 1   magnesium gluconate (MAGONATE) 500 MG tablet  Take 500 mg by mouth daily.     meloxicam (MOBIC) 15 MG tablet Take 1 tablet (15 mg total) by mouth daily. 30 tablet 0   meloxicam (MOBIC) 15 MG tablet TAKE 1 TABLET (15 MG TOTAL) BY MOUTH DAILY. 30 tablet 0   methylPREDNISolone (MEDROL DOSEPAK) 4 MG TBPK tablet Take as directed 21 each 0   MIBELAS 24 FE 1-20 MG-MCG(24) CHEW Chew 1 tablet by mouth at bedtime as needed (bowel  regularity).     Multiple Vitamins-Minerals (ADULT GUMMY PO) Take 2 tablets by mouth in the morning.     nitrofurantoin (MACRODANTIN) 100 MG capsule Take 100 mg by mouth as needed (For urinary tract infection.).      oxyCODONE-acetaminophen (PERCOCET/ROXICET) 5-325 MG tablet Take 1-2 tablets by mouth every 6 (six) hours as needed for severe pain. 50 tablet 0   polyethylene glycol (MIRALAX / GLYCOLAX) packet Take 17 g by mouth daily as needed for mild constipation. 14 each 0   propranolol ER (INDERAL LA) 60 MG 24 hr capsule TAKE 1 CAPSULE BY MOUTH EVERY DAY 90 capsule 0   psyllium (METAMUCIL) 58.6 % powder Take 1 packet by mouth daily as needed (constipation).     SUMAtriptan (IMITREX) 100 MG tablet Take 1 tablet (100 mg total) by mouth every 2 (two) hours as needed for migraine. May repeat in 2 hours if headache persists or recurs. 10 tablet 2   temazepam (RESTORIL) 30 MG capsule Take 1 capsule by mouth at bedtime as needed for sleep 30 capsule 0   Vitamin D-Vitamin K (VITAMIN K2-VITAMIN D3 PO) Take 1-2 sprays by mouth daily.     No current facility-administered medications for this visit.    Medication Side Effects: None   Allergies:  Allergies  Allergen Reactions   Hydrocodone Itching   Sulfamethoxazole-Trimethoprim Itching   Dust Mite Extract Other (See Comments)    Sneezing, watery eyes, runny nose   Latex Itching   Other Other (See Comments)    PT IS ALLERGIC TO CAT DANDER AND RAGWEED - Sneezing, watery eyes, runny nose    Pollen Extract Other (See Comments)    Sneezing, watery eyes, runny nose     Past Medical History:  Diagnosis Date   Abnormal Pap smear 2011   hpv/mild dysplasia,cin1   Anxiety    Cerebral palsy (HCC)    right arm/leg   Cystocele    Depression    Headache    Neuromuscular disorder (HCC)    Cerebral Palsy   OCD (obsessive compulsive disorder)    Osteoporosis    Uterine prolaps     Family History  Problem Relation Age of Onset   Cancer Father         skin AND LUNG   Alcohol abuse Sister        CRACK COCAINE    Social History   Socioeconomic History   Marital status: Married    Spouse name: Not on file   Number of children: Not on file   Years of education: Not on file   Highest education level: Not on file  Occupational History   Not on file  Tobacco Use   Smoking status: Never   Smokeless tobacco: Never  Substance and Sexual Activity   Alcohol use: Not Currently    Comment: OCCASIONAL beer   Drug use: No   Sexual activity: Yes    Birth control/protection: Pill    Comment: LOESTRIN 24 FE  Other Topics Concern   Not on file  Social History Narrative   Not on file   Social Drivers of Health   Financial Resource Strain: Not on file  Food Insecurity: Not on file  Transportation Needs: Not on file  Physical Activity: Not on file  Stress: Not on file  Social Connections: Not on file  Intimate Partner Violence: Not on file    Past Medical History, Surgical history, Social history, and Family history were reviewed and updated as appropriate.   Please see review of systems for further details on the patient's review from today.   Objective:   Physical Exam:  LMP  (LMP Unknown)   Physical Exam Constitutional:      General: She is not in acute distress. Neurological:     Mental Status: She is alert and oriented to person, place, and time.     Cranial Nerves: No dysarthria.     Motor: Weakness present.     Gait: Gait abnormal.  Psychiatric:        Attention and Perception: Attention and perception normal.        Mood and Affect: Mood is anxious and depressed.        Speech: Speech normal. Speech is not slurred.        Behavior: Behavior normal. Behavior is cooperative.        Thought Content: Thought content normal. Thought content is not delusional. Thought content does not include homicidal or suicidal ideation. Thought content does not include suicidal plan.        Cognition and Memory: Cognition and  memory normal. Cognition is not impaired.        Judgment: Judgment normal.     Comments: Insight intact Ongoing OCD remains fairly severe but less anxious Checking compulsions now about 30 mins Chronic depression persistent but better than it was a year ago.       Lab Review:     Component Value Date/Time   NA 138 06/11/2021 0606   K 4.0 06/11/2021 0606   CL 107 06/11/2021 0606   CO2 26 06/11/2021 0606   GLUCOSE 90 06/11/2021 0606   BUN 18 06/11/2021 0606   CREATININE 0.81 06/11/2021 0606   CALCIUM 9.4 06/11/2021 0606   PROT 6.5 06/11/2021 0606   ALBUMIN 3.3 (L) 06/11/2021 0606   AST 17 06/11/2021 0606   ALT 14 06/11/2021 0606   ALKPHOS 141 (H) 06/11/2021 0606   BILITOT 0.2 (L) 06/11/2021 0606   GFRNONAA >60 06/11/2021 0606   GFRAA >60 07/09/2016 0438       Component Value Date/Time   WBC 5.8 06/11/2021 0606   RBC 4.12 06/11/2021 0606   HGB 12.5 06/11/2021 0606   HCT 39.7 06/11/2021 0606   PLT 299 06/11/2021 0606   MCV 96.4 06/11/2021 0606   MCH 30.3 06/11/2021 0606   MCHC 31.5 06/11/2021 0606   RDW 13.9 06/11/2021 0606   LYMPHSABS 1.9 06/11/2021 0606   MONOABS 0.5 06/11/2021 0606   EOSABS 0.1 06/11/2021 0606   BASOSABS 0.0 06/11/2021 0606    No results found for: "POCLITH", "LITHIUM"   No results found for: "PHENYTOIN", "PHENOBARB", "VALPROATE", "CBMZ"   .res Assessment: Plan:    Casey "[name]" was seen today for follow-up, depression, anxiety and sleeping problem.  Diagnoses and all orders for this visit:  Recurrent major depression resistant to treatment [facility]) (r/o MDD+Dysthymia)  Mixed obsessional thoughts and acts  Social anxiety disorder  Relationship problem with family members  Caregiver stress  Migraine without aura and without status migrainosus, not intractable  Both primary Dx of OCD and major depression are TR and marked.  Impaired function but less so with Spravato re: depression..   She has been receiving Spravato 84 mg weekly  and moderate improvement in the depression..  she feels it also helps OCD somewhat.  However still easily overwhelmed with low stress tolerance.  Family contributes to her anxiety and stress markedly.  The OCD is improved with the increase in fluvoxamine and with Spravato.  Spends up to 2 hours daily and checking compulsions on her worst days but better when she travels.  No new options for tx are evident.  She has been on higher doses of fluvoxamine above the usual max of 400 mg daily in the past and finds it more helpful at the higher dose.    Disc SE.   She is tolerating the meds well  Continue  Luvox back to 400 mg nightly as of January 2023. Disc dosing higher than usual.  She feels this is increase has helped more with OCD which remains chronically severe.  Continue Auvelity BID and reduced Wellbutrin to 1 AM. It has helped and is tolerated.   We have discussed seizure risk that is possible using this combination but given severity of her symptoms she feels the risk is warranted.  Do not consider stopping this until she gets the full benefit of Spravato. There are few other alternative medication options that remain.   Disc DDI issues.   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 She doesn't want to try this now.  We discussed the short-term risks associated with benzodiazepines including sedation and increased fall risk among others.  Discussed long-term side effect risk including dependence, potential withdrawal symptoms, and the potential eventual dose-related risk of dementia.  But recent studies from 2020 dispute this association between benzodiazepines and dementia risk. Newer studies in 2020 do not support an association with dementia. Disc this is high dose and not ideal.  Also disc risk combining it with temazepam. Rec try gradually reduce HS lorazepam to 1 mg Hs.  Can continue lorazepam 2 mg AM and 1 mg in afternoon bc of chronic anxiety and it is helpful  and tolerated. She can continue temazepam 30 mg nightly.  She tends to have a lot of anxious negative thoughts at night when she is trying to go to bed.  She is trying to reduce the dose.  Complaining of HA and history migraine.  Asks for increase imitrex and disc preventatives like propranolol ER imitrex to 100 mg prn migraine and propranolol ER for migraine prevention.  Counseling 30 min on how strongly her family's behavior affects her mood.  Looked at some reasons for this and techniques to address her mood and response.  Disc mother and Brothers in particular Disc trasportation problems limiting access to [employer].  Disc alternative options.   Also working on trying to develop social and spiritual goals.  No med changes  continue Auvelity 1 in AM & PM continue Wellbutrin XL 150 mg AM Fluvoxamine 100 mg AM and 300 mg PM above usual max bc med necessary Lorazepam 2 mg HS and prn 1 mg BID prn anxiety daily Temazepam 30 mg HS Propranolol ER 60 mg daily Imitrex prn migraine  She wants to resume Spravato now bc dep worse without it.  FU 2-weeks   Meredith Staggers, MD, DFAPA  Please see After Visit Summary for patient specific instructions.  Future Appointments  Date Time Provider Department Center  01/19/2024 11:45 AM Candelaria Stagers, DPM [facility]  01/23/2024 11:00 AM Cottle, Steva Ready., MD [facility] None  01/23/2024  3:00 PM Cottle, Steva Ready., MD [facility] None  01/23/2024  3:00 PM CP-NURSE [facility] None  01/25/2024  1:00 PM Robley Fries, PhD [facility] None  02/08/2024  1:00 PM Robley Fries, PhD [facility] None  03/07/2024  1:00 PM Robley Fries, PhD [facility] None  03/27/2024  1:00 PM Robley Fries, PhD [facility] None  04/10/2024  1:00 PM Robley Fries, PhD [facility] None    No orders of the defined types were placed in this encounter.    -------------------------------

## 2024-01-19 ENCOUNTER — Ambulatory Visit: Admitting: Podiatry

## 2024-01-19 ENCOUNTER — Other Ambulatory Visit: Payer: Self-pay

## 2024-01-19 MED ORDER — SPRAVATO (84 MG DOSE) 28 MG/DEVICE NA SOPK: PACK | NASAL | 1 refills | Status: DC

## 2024-01-22 ENCOUNTER — Telehealth: Payer: Self-pay

## 2024-01-22 NOTE — Telephone Encounter (Signed)
 Prior authorization medical plan for Spravato

## 2024-01-23 ENCOUNTER — Ambulatory Visit

## 2024-01-23 ENCOUNTER — Ambulatory Visit: Admitting: Psychiatry

## 2024-01-23 ENCOUNTER — Ambulatory Visit: Payer: 59 | Admitting: Psychiatry

## 2024-01-23 VITALS — BP 122/97 | HR 78

## 2024-01-23 DIAGNOSIS — F5105 Insomnia due to other mental disorder: Secondary | ICD-10-CM

## 2024-01-23 DIAGNOSIS — F422 Mixed obsessional thoughts and acts: Secondary | ICD-10-CM

## 2024-01-23 DIAGNOSIS — F4010 Social phobia, unspecified: Secondary | ICD-10-CM

## 2024-01-23 DIAGNOSIS — F339 Major depressive disorder, recurrent, unspecified: Secondary | ICD-10-CM

## 2024-01-23 DIAGNOSIS — Z636 Dependent relative needing care at home: Secondary | ICD-10-CM

## 2024-01-23 DIAGNOSIS — G43009 Migraine without aura, not intractable, without status migrainosus: Secondary | ICD-10-CM

## 2024-01-23 NOTE — Progress Notes (Signed)
 Casey Diaz [id number] 15-Sep-1966 58 y.o.    Subjective:   Patient ID:  Casey Diaz is a 58 y.o. (DOB 1966-08-05) female.  Chief Complaint:  Chief Complaint  Patient presents with   Follow-up   Depression   Anxiety   Fatigue   Sleeping Problem     HPI Casey Diaz presents to the office today for follow-up of OCD and severe anxiety.     December 2019 visit the following was noted: No meds were changed. Lives in [name] and back for followup.  Sx are about the same.  Has to take meds with different sizes. Pt reports that mood is Anxious and Depressed and describes anxiety as Severe. Anxiety symptoms include: Excessive Worry, Obsessive Compulsive Symptoms:   Checking,,. Pt reports has interrupted sleep and nocturia. Pt reports that appetite is good. Pt reports that energy is no change and down slightly. Concentration is down slightly. Suicidal thoughts:  denied by patient. Loves the environment of [name] but misses some things there.  She's not able to work there.  H works there and likes it.  Struggled with not working, feels isolated and not up to task of meeting people.  Does attend a church and met a friend who's been helpful.  Leaving for [name] on 10/16/18.   04/09/2020 appointment the following is noted:  Staying another year in [name] bc Covid and other things. Last few months a lot of crying spells.  Is in menopause. Wonders about med changes though is nervous about it.  Crying spells associated with depressing thoughts more than stress or OCD.   Covid really hard on everyone and couldn't see family for 18 mos.  Family still very dysfunctional. No close friends in part due to OCD and depression. Son high Autism spectrum with ADHD and anxiety and she's with him all the time. Greater health problems with CP so more pains.   05/15/20 appt with the following noted: [name] for menopause and helps some. Still depressed.  Chronically. In [place] for 2 more weeks  then to [name] for another year. A lot of stressors lately triggering more checking and anxiety.   OCD is her CC now and seems.  Got worse DT stress.   Stressed with Asberger's son and her health.  H works a lot.  Her FOO still stress. Plan: Trintellix 10 mg 1 tablet in the morning with food and reduce fluvoxamine to 5 tablets nightly for 1 week  then reduce it to 4 tablets nightly.   07/02/20 appt with the following noted: Decided not to get Trintellix bc difficulty getting it. It is available.  There.  Wants to start it now.   Both depression and OCD are severe.  Not suicidal in intent or plan. Did not take samples with her to [name] but will be back in December. covid is worse there and travel is difficult.  Wants to reduce Wellbutrin DT dry mouth. Plan: She's afraid to reduce Luvox at this time DT fear of worsening OCD.  But will consider. Trintellix 10 mg 1 tablet in the morning with food and reduce fluvoxamine to 5 tablets nightly for 1 week  then reduce it to 4 tablets nightly. Also reduce Wellbutrin XL to 300 mg daily.    9-13 2022 appointment with the following noted: Back in [place] since July 14.  Broke arm a month ago and surgery.  It's all been rough adjustment.   B has cancer on his face and M fell taking him  to the doctor.  Misses the water and weather of [name].   Cry a lot more since menopause. Still depression and anxiety and OCD.  Asks about ketamine. On Wellbutrin 300, Luvox 300.  No Trintellix. Added Ativan 2 mg AM and HS and it helps.  More likely to get upset at night. Plan: Increase Luvox back to 400 mg daily.  She thinks she's worse on less. Continue Wellbutrin XL to 300 mg daily. 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  She tolerated it well without unusual nausea or vomiting headache or other somatic symptoms.  She did have the expected dissociation which  gradually resolved over the course of the 2-hour period of observation.  She was a little concerned about her balance given her cerebral palsy but has not noted unusual or unexpected problems.  She is motivated to can continue Spravato in hopes of reducing her depressive symptoms. 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  She also has treatment resistant OCD which is partially managed with medications but is still quite disabling.  She is tolerating the medications well.  She is sleeping adequately.  Her appetite is adequate.  She is not having suicidal thoughts.  She continues to wish for a better treatment for OCD that would give her some relief.  09/30/2021 appointment with the following noted: She received her first dose of Spravato 84 mg intranasally today.  She tolerated it well without unusual nausea, vomiting, or other somatic symptoms.  Dissociation as expected did occur and gradually resolved over the 2-hour period of observation.  She did have a mild headache today with the treatment and received ibuprofen 600 mg at her request.  We will follow this to see if it is a pattern Patient is still depressed.  She said she was late with her medicine today and today was a particularly depressing day.  However she notes that the Spravato has lifted her mood considerably even today.  She is hopeful that it will continue to be helpful.  No suicidal thoughts.  She has ongoing chronic anxiety and OCD at baseline.  10/04/21 appt noted: Patient received Spravato 84 mg for the second time today.  She tolerated it well without any unusual headache, nausea or vomiting or other somatic symptoms.  Dissociation did occur and she gradually [place] resolution over the 2-hour period of observation. She did not have any unusual problems after she left the office last Spravato administration.  She did not have any specific problems with balance or walking.  She is at increased risk of  that difficulty because of cerebral palsy.  So far she has not noticed much mood effect from the medication beyond the first day of receiving it.  However she would like to continue Spravato in hopes of getting the antidepressant effect that is desired. Stress dealing with mother's behavior at party pt hosted.  Guilt over it.  10/07/2021 appointment noted: Patient received Spravato 84 mg for the second time today.  She tolerated it well without any unusual headache, nausea or vomiting or other somatic symptoms.  Dissociation did occur and she gradually [place] resolution over the 2-hour period of observation. She still is not sure about the antidepressant effect of Spravato.  Events over the holidays and demands, make it difficult to assess.  She still notes that the OCD tends to worsen the depression and vice versa.  She tolerates the Spravato well and wants to continue the trial.  10/15/2021 appointment with the following noted: Patient received Spravato 84 mg for the second time today.  She tolerated it well without any unusual headache, nausea or vomiting or other somatic symptoms.  Dissociation did occur and she gradually [place] resolution over the 2-hour period of observation. Patient says it was somewhat difficult to evaluate the effect of the Spravato.  It was scheduled to be twice weekly for 4 weeks consecutively but the holidays have interfered with that administration.  She asked what specifically should be she should be looking for in order to assess improvement.  That was discussed.  The OCD is unchanged and the depression so far is not significantly different.  She still tolerates meds.  There have been no recent med changes  10/19/2021 appt noted: Patient received Spravato 84 mg for the second today.  She tolerated it well without any unusual headache, nausea or vomiting or other somatic symptoms.  Dissociation did occur and she gradually saw resolution over the 2-hour period of observation.    10/21/2021 appointment noted: Patient received Spravato 84 mg today.  She tolerated it well without any unusual headache, nausea or vomiting or other somatic symptoms.  Dissociation did occur and she gradually saw resolution over the 2-hour period of observation.  She feels better than last week.  She is not as depressed and down.  She is still dealing with grief around the death of her cousin that was unexpected.  It is still difficult to tell how much the Spravato was doing but she is hopeful.  Anxiety is still present with the OCD.  She is not having suicidal thoughts.  She is not hopeless.  She wants to continue treatment.  10/25/2021 appointment with the following noted: Patient received Spravato 84 mg today.  She tolerated it well without any unusual headache, nausea or vomiting or other somatic symptoms.  Dissociation did occur and she gradually saw resolution over the 2-hour period of observation.  She does not typically find the dissociation very strong. She is beginning to think the Spravato is helping somewhat with the depression.  It has been difficult to tell with the holidays intervening as well as the death of her cousin.  She has not been able to get Spravato twice weekly for 4 weeks straight as typically planned.  However she is hopeful.  The OCD remains significant.  She still has a tendency to think very negatively.  She is not suicidal.  10/28/2021 appointment with the following noted: Patient received Spravato 84 mg today.  She tolerated it well without any unusual headache, nausea or vomiting or other somatic symptoms.  Dissociation did occur and she gradually saw resolution over the 2-hour period of observation.  She does not typically find the dissociation very strong. She is feeling more hopeful about the administration of Spravato.  She is having less depression she believes.  Still not dramatically different.  She still has a tendency to have a lot of anxiety and rumination and  OCD.  She is not suicidal.  She is eager to continue the Spravato.  11/01/2021 appointment with the following noted: Patient received Spravato 84 mg today.  She tolerated it well without any unusual headache, nausea or vomiting or other somatic symptoms.  Dissociation did occur and she gradually saw resolution over the 2-hour period of observation.  She does not typically find the dissociation very strong. She is continuing to see a little bit of  improvement in depression with Spravato.  The anxiety remains but may be not as severe.  The OCD remains markedly severe chronically.  She is not suicidal.  She is encouraged by the degree of improvement with Spravato and inability to enjoy things more and not be quite as ruminative.  11/04/2021 appt noted: Patient received Spravato 84 mg today.  She tolerated it well without any unusual headache, nausea or vomiting or other somatic symptoms.  Dissociation did occur and she gradually saw resolution over the 2-hour period of observation.  She does not typically find the dissociation very strong. No SE complaints with meds. She continues to feel hopeful about the Spravato.  She has less depression.  Because of a number of factors she is uncertain of the full benefit but thinks she is somewhat less depressed.  Her anxiety and OCD remain significant but a little better.  She is tolerating the medications and does not desire medicine change.  She is not currently complaining of insomnia.   11/08/2021 appointment the following noted: Patient received Spravato 84 mg today.  She tolerated it well without any unusual headache, nausea or vomiting or other somatic symptoms.  Dissociation did occur and she gradually saw resolution over the 2-hour period of observation.  She does not typically find the dissociation very strong. No SE complaints with meds. She feels the Spravato is helping somewhat.  She would like to see a greater effect.  However she is able to enjoy things.   She is productive at home.  She would like to see a lifting of a degree of sadness that remains.  The anxiety and OCD remained largely unchanged.  She wondered about the dosing of Wellbutrin 300 mg a day and Luvox 300 mg a day and possible increases.  She has been at higher doses in the past.  She plans to start water therapy for her weakness and for her shoulder.  11/11/2021 appointment with the following noted: Patient received Spravato 84 mg today.  She tolerated it well without any unusual headache, nausea or vomiting or other somatic symptoms.  Dissociation did occur and she gradually saw resolution over the 2-hour period of observation.  She does not typically find the dissociation very strong. No SE complaints with meds. She feels the Spravato is clearly helping the depression.  She would like to see a more significant effect.  She is still having trouble thinking positive. Her energy is fair.  Concentration is good except for the problem with chronic obsessions. She has been taking Wellbutrin 300 mg in Luvox 300 mg for quite some time but has taken higher doses in the past.  We discussed that.  She would like to try higher doses in order to get a better effect if possible. We just increased the doses a couple of days ago.  No effect yet.  11/15/2021 appointment with the following noted: Patient received Spravato 84 mg today.  She tolerated it well without any unusual headache, nausea or vomiting or other somatic symptoms.  Dissociation did occur and she gradually saw resolution over the 2-hour period of observation.  She does not typically find the dissociation very strong. No SE complaints with meds. The patient is now convinced that the Spravato is helping the depression.  She would like to continue twice weekly Spravato this week if possible.  She has tolerated the increase in Wellbutrin to 450 mg daily and the increase and fluvoxamine to 400 mg daily without complications thus far.  The OCD  and anxiety feed the depression to some extent. She spends approximately 2 hours daily with checking compulsions due to obsessions about causing harm to others.  For example fearing that when she has hit a pot hole that she may have hit a person and going back to check.  Checking corners and rooms out of fear that she may have harmed someone.  Other various checking compulsions.  She is hoping the increase in fluvoxamine to 400 mg will reduce that over the weeks to come.  She is not seeing a significant difference with the addition of the Spravato though she understands that was not expected.  She is more productive at home and more motivated and able to enjoy things more fully as a result of the Spravato treatment.  She is tolerating the medication  11/18/2021 appointment with the following noted: Patient received Spravato 84 mg today.  She tolerated it well without any unusual headache, nausea or vomiting or other somatic symptoms.  Dissociation did occur and she gradually saw resolution over the 2-hour period of observation.  She does not typically find the dissociation very strong. No SE complaints with meds. She clearly believes the Spravato has been helpful for the depression.  She wonders whether to continue to treatments weekly or to cut back to 1 weekly.  She would like to continue twice weekly in hopes of getting additional improvement in the depression because it is not resolved but it is difficult to get here twice a week in terms of arranging rides. She is recently increased Wellbutrin XL to 450 mg daily and fluvoxamine to 400 mg daily but they have not had time to have an official effect.  She is tolerating that well.  She is tolerating meds overwork overall well. The OCD remains the same as noted on 11/15/2021  11/25/21 appt noted: Patient received Spravato 84 mg today.  She tolerated it well without any unusual headache, nausea or vomiting or other somatic symptoms.  Dissociation did occur and  she gradually saw resolution over the 2-hour period of observation.  She does not typically find the dissociation very strong. No SE complaints with meds. She thinks the increase in Wellbutrin and Luvox have been potentially helpful for depression and OCD respectively.  It has been too early to see the full effect.  She is sleeping and eating well.  She is functioning at home.  She still spends a lot of time that is about 2 hours a day dealing with compulsive behaviors.  12/02/21 appt noted: Patient received Spravato 84 mg today.  She tolerated it well without any unusual headache, nausea or vomiting or other somatic symptoms.  Dissociation did occur and she gradually saw resolution over the 2-hour period of observation.  She does not typically find the dissociation very strong. No SE complaints with meds. Several losses and stressors recently that affect her sense of mood. However still sees significant benefit from the Spravato for her depression.  Wants to continue it. Suspect early  some benefit from the increased Wellbutrin for depression and Luvox for OCD. Tolerating meds. No complaints about the meds. Sleeping and eating well.  No new health concerns.  12/09/21 appt noted: Patient received Spravato 84 mg today.  She tolerated it well without any unusual headache, nausea or vomiting or other somatic symptoms.  Dissociation did occur and she gradually saw resolution over the 2-hour period of observation.  She does not typically find the dissociation very strong. No SE complaints with meds. Seeing noticeable  improvement from increase fluvoxamine to 400 mg daily.  Tolerating meds without concerns over them. Depression is stable with residual sx of easy guilt and easily stressed.  OCD contributes to depression but depression is not severe with less crying spells.  Productive at home with chores.  Enjoyed recent birthday.  Sleeping good. No new concerns.  12/23/2021 appointment noted: Patient  received Spravato 84 mg today.  She tolerated it well without any unusual headache, nausea or vomiting or other somatic symptoms.  Dissociation did occur and she gradually saw resolution over the 2-hour period of observation.  She does not typically find the dissociation very strong. No SE complaints with meds. Seeing noticeable improvement from increase fluvoxamine to 400 mg daily.  Tolerating meds without concerns over them. Her depression is somewhat improved with the Spravato.  She also feels generally a little lighter.  She is more motivated.  She is less overwhelmed by guilt.  The OCD is gradually improving but is still quite time-consuming as noted before.  She is sleeping well.  No side effects  12/30/2021 appointment with the following noted: Patient received Spravato 84 mg today.  She tolerated it well without any unusual headache, nausea or vomiting or other somatic symptoms.  Dissociation did occur and she gradually saw resolution over the 2-hour period of observation.  She does not typically find the dissociation very strong. No SE complaints with meds. Seeing noticeable improvement from increase fluvoxamine to 400 mg daily.  Tolerating meds without concerns over them. She is confident of her the improvement seen with Spravato.  She is less hopeless.  Guilt is marked remarkably improved.  She is not having any thoughts of death or dying.  She is more motivated for activities such as exercise which she is recently started.  She is sleeping well. The OCD remains severe but it is improving somewhat with the increase in fluvoxamine.  It is still consuming a couple hours per day.  01/10/22 apravato 84 admin  01/24/22 appt noted: Patient received Spravato 84 mg today.  She tolerated it well without any unusual headache, nausea or vomiting or other somatic symptoms.  Dissociation did occur and she gradually saw resolution over the 2-hour period of observation.  She does not typically find the  dissociation very strong. No SE complaints with meds. Very tearful today.  Feels like she has been suppressing emotion in the Spravato caused it to be released.  Discussed some stressors.  Overall still feels the medicine is helpful.  She has missed some of the scheduled Spravato treatments that were intended to be weekly due to circumstances beyond her control.  She is still struggling with OCD as previously noted but does believe the medications are helpful. Plan no med changes  01/31/2022 received Spravato 84 mg today  02/09/2022 appointment with the following noted: Patient received Spravato 84 mg today.  She tolerated it well without any unusual headache, nausea or vomiting or other somatic symptoms.  Dissociation did occur and she gradually saw resolution over the 2-hour period of observation.  She does not typically find the dissociation very strong. No SE complaints with meds. Spravato clearly helps depression and OCD but easily gets overwhelmed and tearful with fairly routine stressors.  Tolerating meds. Sleep and appetite is OK Asks to increase lorazepam to 2 mg AM and HS and 1mg  afternoon  02/16/22 appt noted: Patient received Spravato 84 mg today.  She tolerated it well without any unusual headache, nausea or vomiting or other somatic symptoms.  Dissociation did occur and she gradually saw resolution over the 2-hour period of observation.  She does not typically find the dissociation very strong. No SE complaints with meds. She has chronic depesssion and OCD but is improved with Spravato, both dx versus before.  She has continued Luvox 400 mg and Wellbutrin 450 mg and is tolerating it.  Chronically easily stressed.  Tolerating all meds.  Doesn't like taking more meds.  Spending a couple hours daily with OCD.  No SI No med changes.  02/21/22 appt noted:   Doesn't like taking more meds.  Spending a couple hours daily with OCD.  No SI No med changes.  02/21/22 appt noted: Patient received  Spravato 84 mg today.  She tolerated it well without any unusual headache, nausea or vomiting or other somatic symptoms.  Dissociation did occur and she gradually saw resolution over the 2-hour period of observation.  She does not typically find the dissociation very strong. No SE complaints with meds. She has chronic depesssion and OCD but is improved with Spravato, both dx versus before.  She has continued Luvox 400 mg and Wellbutrin 450 mg and is tolerating it.  Chronically easily overwhelmed and doesn't know why.  Tolerating all meds. Wants to continue meds.  03/16/22 appt noted: Patient received Spravato 84 mg today.  She tolerated it well without any unusual headache, nausea or vomiting or other somatic symptoms.  Dissociation did occur and she gradually saw resolution over the 2-hour period of observation.  She does not typically find the dissociation very strong. No SE complaints with meds. Overall she still feels the Spravato has been helpful not only for her depression but also for her OCD which was somewhat unexpected.  OCD is still significant but it is less severe than prior to starting Spravato.  She is tolerating Luvox 400 mg and Wellbutrin 450 mg.  We discussed possible med adjustments.  03/23/22 appt noted: Patient received Spravato 84 mg today.  She tolerated it well without any unusual headache, nausea or vomiting or other somatic symptoms.  Dissociation did occur and she gradually saw resolution over the 2-hour period of observation.  She does not typically find the dissociation very strong. No SE complaints with meds. She is still depressed and still has OCD of course but is improved with the Spravato.  She is tolerating the medications well.  We had previously discussed the possibility of switching some of the Wellbutrin to [facility] and she is very interested in that in hopes of further improvement in depression and OCD.  She understands that Auvelity is not used for OCD on the label.   She is tolerating the medications.  She is still easily overwhelmed.  She is sleeping and eating okay.. Plan: Reduce Wellbutrin XL to 300 mg AM and add Auvelity 1 tablet each AM  03/30/22 appt noted: Patient received Spravato 84 mg today.  She tolerated it well without any unusual headache, nausea or vomiting or other somatic symptoms.  Dissociation did occur and she gradually saw resolution over the 2-hour period of observation.  She does not typically find the dissociation very strong. No SE complaints with meds. She is still depressed and still has OCD of course but is improved with the Spravato.  She is tolerating the medications well.  No difference with Auvelity 1 AM so far and no SE.  Going on vacation on Saturday. 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 Sleep and appetite good. Plan: Increase Auvelity to 1 twice daily and  reduce Wellbutrin to XL 150 every morning  04/14/2022 appointment with the following noted: Patient received Spravato 84 mg today.  She tolerated it well without any unusual headache, nausea or vomiting or other somatic symptoms.  Dissociation did occur and she gradually saw resolution over the 2-hour period of observation.  She does not typically find the dissociation very strong. No SE complaints with meds. She is still depressed and still has OCD of course but is improved with the Spravato.  She is tolerating the medications well.  Just increased Auvelity to BID yesterday and reduced Wellbutrin to 150 AM. No SE so far.  No change in mood or anxiety so far.  Chronic OCD as noted and residual depression and chronic fatigue.  04/21/2022 appointment with the following noted: Patient received Spravato 84 mg today.  She tolerated it well without any unusual headache, nausea or vomiting or other somatic symptoms.  Dissociation did occur and she gradually saw resolution over the 2-hour period of observation.  She does not typically find the dissociation very strong. No  SE complaints with meds. She is still depressed and still has OCD of course but is improved with the Spravato.   She has questions about the dosing of lorazepam. She tends to have negative anxious thoughts at night.  This tends to interfere with her ability to go to sleep.  She is getting about 8 to 9 hours of sleep.  She is tolerating the meds without excessive sedation and does not nap during the day.  05/06/22 appt noted: Patient received Spravato 84 mg today.  She tolerated it well without any unusual headache, nausea or vomiting or other somatic symptoms.  Dissociation did occur and she gradually saw resolution over the 2-hour period of observation.  She does not typically find the dissociation very strong. No SE complaints with meds. She is still depressed and still has OCD of course but is improved with the Spravato.   Had some questions about timing of dosing of fluvoxamine and Auvelity. OCD is not quite as time consuming.  Sleep and eating are the same.   No SE meds.  05/25/22 appt noted: Patient received Spravato 84 mg today.  She tolerated it well without any unusual headache, nausea or vomiting or other somatic symptoms.  Dissociation did occur and she gradually saw resolution over the 2-hour period of observation.  She does not typically find the dissociation very strong. No SE complaints with meds. She is still depressed and still has OCD of course but is improved with the Spravato.   She has less OCD when away from home and on vacation of note. Plan: Rec gradually reduce HS lorazepam to 1 mg Hs.  Can continue lorazepam 2 mg AM and 1 mg in afternoon bc of chronic anxiety and it is helpful and tolerated. She can continue temazepam 30 mg nightly.  She tends to have a lot of anxious negative thoughts at night when she is trying to go to bed  06/16/22 appt noted: Patient received Spravato 84 mg today.  She tolerated it well without any unusual headache, nausea or vomiting or other somatic  symptoms.  Dissociation did occur and she gradually saw resolution over the 2-hour period of observation.  She does not typically find the dissociation very strong. No SE complaints with meds. She is still depressed and still has OCD of course but is improved with the Spravato.   She has less OCD when away from home and on vacation of note. She  is tolerating the medications.  She has continued current medications. 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  She still receives benefit from [place].  Her depression and OCD are better with the Spravato.  She thinks she is getting additional benefit with the switch from Wellbutrin to [place].  07/04/2022 appointment noted: Reports she developed a rash on her face from [facility] and feels like she is allergic to it.  She stopped it and went back to Wellbutrin 450 mg every morning.  The rash has cleared up.  She did not require any medical attention and did not have shortness of breath. Overall her depression and OCD are about the same as they have been.  She did not notice a substantial difference from the brief treatment with Auvelity but she understands she did not take a full course.  She is tolerating the current medicines well. Current meds fluvoxamine 400 mg daily, Wellbutrin XL 450 mg daily, lorazepam 1 to 2 mg in the morning and 1 to 2 mg at night and 1 mg in the afternoon, temazepam 30 mg nightly. She wants to continue the Spravato because she feels it has been helpful for both her depression and her racing OCD  07/18/22 appt noted: Patient received Spravato 84 mg today.  She tolerated it well without any unusual headache, nausea or vomiting or other somatic  symptoms.  Dissociation did occur and she gradually saw resolution over the 2-hour period of observation.  She does not typically find the dissociation very strong. No SE complaints with meds. She is still depressed and still has OCD of course but is improved with the Spravato.  Rash better off Auvelity and back on Welllbutrin XL 450 mg AM, fluvoxamine 400 mg daily.  08/15/22 appt noted: Current psych meds: Wellbutrin XL 450 mg AM, fluvoxamine 100 mg in AM and 300 mg HS, lorazepam 1 mg 1-2 mg in the AM and HS and 1 tablet prn midday for anxiety, temazepam 30 mg HS Patient received Spravato 84 mg today.  She tolerated it well without any unusual headache, nausea or vomiting or other somatic symptoms.  Dissociation did occur and she gradually saw resolution over the 2-hour period of observation.  She does not typically find the dissociation very strong. No SE complaints with meds. She has a great deal of stress dealing with her family.  Disc brother's ongoing mania and difficulty getting him help and the stress he causes for the family. She wants to continue Spravato through this very stressful holdicay season and reevaluate the frequency after the New Year.  09/12/22 appt noted: Current psych meds: Wellbutrin XL 450 mg AM, fluvoxamine 100 mg in AM and 300 mg HS, lorazepam 1 mg 1-2 mg in the AM and HS and 1 tablet prn midday for anxiety, temazepam 30 mg HS Patient received Spravato 84 mg today.  She tolerated it well without any unusual headache, nausea or vomiting or other somatic symptoms.  Dissociation did occur and she gradually saw resolution over the 2-hour period of observation.  She does not typically find the dissociation very strong. No SE complaints with meds. She has a great deal of stress dealing with her family.  Disc brother's ongoing mania and difficulty getting him help and the stress he causes for the family. She wants to continue Spravato through this very stressful holdicay season  and reevaluate the frequency after the New Year.  Chronically easily overwhelmed with family. Complaining of HA and history migraine.  Asks for increase imitrex and disc preventatives like propranolol ER  09/26/22 appt noted: Current psych meds: Wellbutrin XL 450 mg AM, fluvoxamine 100 mg in AM and 300 mg HS, lorazepam 1 mg 1-2 mg in the AM and HS and 1 tablet prn midday for anxiety, temazepam 30 mg HS Patient received Spravato 84 mg today.  She tolerated it well without any unusual headache, nausea or vomiting or other somatic symptoms.  Dissociation did occur and she gradually saw resolution over the 2-hour period of observation.  She does not typically find the dissociation very strong. No SE complaints with meds. She has a great deal of stress dealing with her family.  This is ongoing The holidays are much more stressful DT family problems.  She is noting OCD is much worse over the last couple of week.  Depression is better with Spravato. Needed higher dose meds for migraine.   10/03/22 appt noted: Current psych meds: Wellbutrin XL 450 mg AM, fluvoxamine 100 mg in AM and 300 mg HS, lorazepam 1 mg 1-2 mg in the AM and HS and 1 tablet prn midday for anxiety, temazepam 30 mg HS Patient received Spravato 84 mg today.  She tolerated it well without any unusual headache, nausea or vomiting or other somatic symptoms.  Dissociation did occur and she gradually saw resolution over the 2-hour period of observation.  She does not typically find the dissociation very strong. No SE complaints with meds. She has now realized that the rash she previously previously attributed to [facility] was not related.  She is interested may be retrying that after the holidays.  She is tolerating medications otherwise. The holidays remain chronically stressful to her due to family dynamic problems which cause her to consistently feel stuck.  Under more stress her OCD is worse.  She will have a tendency to have crying spells.   The depression and OCD are still improved with Spravato as compared to before.  11/15/22 appt noted: Current psych meds: Wellbutrin XL 450 mg AM, fluvoxamine 100 mg in AM and 300 mg HS, lorazepam 1 mg 1-2 mg in the AM and HS and 1 tablet prn midday for anxiety, temazepam 30 mg HS Patient received Spravato 84 mg today.  She tolerated it well without any unusual headache, nausea or vomiting or other somatic symptoms.  Dissociation did occur and she gradually saw resolution over the 2-hour period of observation.  She does not typically find the dissociation very strong. No SE complaints with meds. Continues to feel depressed and overwhelmed by family problems including her brother's mania and recent eviction and commitment.  Chronic OCD worse when stressed.  No SI.  Tolerating meds. Plan: Per her request continue Wellbutrin XL 450 mg every morning. She has come to the realization that the rash she had previously attributed to [facility] was not related.  She is interested in perhaps retrying Auvelity. There are few alternative medication options that remain.  01/11/23 appt noted: Current psych meds: Wellbutrin XL 150 mg BID and started Auvelity 1 AM, fluvoxamine 100 mg in AM and 300 mg HS, lorazepam 1 mg 1-2 mg in the AM and HS and 1 tablet prn midday for anxiety, temazepam 30 mg HS  Patient received Spravato 84 mg today.  She tolerated it well without any unusual headache, nausea or vomiting or other somatic symptoms.  Dissociation did occur and she gradually saw resolution over the 2-hour period of observation.  She does not typically find the dissociation very strong. No SE complaints with meds. Trouble getting to sessions lately DT transportation problems.   Continues to feel depressed and overwhelmed by OCD and family.  Feels she needs toevery other week Spravato bc it helps for a couple of weeks and then seems to wear off.  Struggleing with OCD and depression both of which are eased by Spravato. Less  crying with Auvelity.  02/07/23 appt noted: Current psych meds: Wellbutrin XL 150 mg BID and started Auvelity 1 AM, fluvoxamine 100 mg in AM and 300 mg HS, lorazepam 1 mg 1-2 mg in the AM and HS and 1 tablet prn midday for anxiety, temazepam 30 mg HS Patient received Spravato 84 mg today.  She tolerated it well without any unusual headache, nausea or vomiting or other somatic symptoms.  Dissociation did occur and she gradually saw resolution over the 2-hour period of observation.  She does not typically find the dissociation very strong. No SE complaints with meds. Trouble getting to sessions lately DT transportation problems.  This is a problem ongoing and thinks she might need to pause Spravato bc won't be able to get her for at least 3 weeks. She is holding pretty steady with a moderate level of anxiety and depression ongoing and chronic.    03/20/23 appt noted: Current psych meds: Wellbutrin XL 150 mg BID and started Auvelity 1 AM, fluvoxamine 100 mg in AM and 300 mg HS, lorazepam 1 mg 1-2 mg in the AM and HS and 1 tablet prn midday for anxiety, temazepam 30 mg HS Patient received Spravato 84 mg today.  She tolerated it well without any unusual headache, nausea or vomiting or other somatic symptoms.  Dissociation did occur and she gradually saw resolution over the 2-hour period of observation.  She does not typically find the dissociation very strong. No SE complaints with meds. She is trying to get back into more regular Spravato administration.  Family issues and transportation problems that led to her missing Spravato.  She feels more depressed without regular Spravato.  Her OCD is chronic but also worse when she misses Spravato.  She does not want any medication changes.  04/03/23 appt noted: Current psych meds: Wellbutrin XL 150 mg BID and started Auvelity 1 AM, fluvoxamine 100 mg in AM and 300 mg HS, lorazepam 1 mg 1-2 mg in the AM and HS and 1 tablet prn midday for anxiety, temazepam 30 mg  HS Patient received Spravato 84 mg today.  She tolerated it well without any unusual headache, nausea or vomiting or other somatic symptoms.  Dissociation did occur and she gradually saw resolution over the 2-hour period of observation.  She does not typically find the dissociation very strong.  It gets rid of negative emotion for awhile after procedure and would like it to last longer.   No SE complaints with meds.  Doesn't really want med changes.   Planning to weekly Spravato.  It is helping dep and anxiety.    04/10/23 appt noted: Current psych meds: Wellbutrin XL 150 mg BID and started Auvelity 1 AM, fluvoxamine 100 mg in AM and 300 mg HS, lorazepam 1 mg 1-2 mg in the AM and HS and 1 tablet prn midday for anxiety, temazepam 30 mg  HS Patient received Spravato 84 mg today.  She tolerated it well without any unusual headache, nausea or vomiting or other somatic symptoms.  Dissociation did occur and she gradually saw resolution over the 2-hour period of observation.  She does not typically find the dissociation very strong.  It gets rid of negative emotion for awhile after procedure and would like it to last longer.   No SE complaints with meds.  Doesn't really want med changes.   Had lidocaine shot for back pain with brief benefit.  Doing some water based PT Spravato went well today. Got pretty upset last week with event related to son's activities.  Got upset with son saying something inappropriate publicly.  Was so embarrassed.  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    He's kind of rebellious lately.  Son is 53 yo.    05/03/23 appt noted: Current psych meds: Wellbutrin XL 150 mg BID and started Auvelity 1 AM, fluvoxamine 100 mg in AM and 300 mg HS, lorazepam 1 mg 1-2 mg in the AM and HS and 1 tablet prn midday for anxiety, temazepam 30 mg HS No SE Patient received Spravato 84 mg today.  She tolerated it well without any unusual headache, nausea or  vomiting or other somatic symptoms.  Dissociation did occur and she gradually saw resolution over the 2-hour period of observation.  She does not typically find the dissociation very strong.  It gets rid of negative emotion for awhile after procedure and would like it to last longer.   Still pleased with benefit from [facility] for mood and anxiety. No SE complaints with meds.  Doesn't really want med changes.   Chronic family px ongoing affects her but nothing she can change.H sick of the family drama.   Continuing counseling helps.  Has some support. Mood and anxiety pretty steady but did go on vacation to [place].  Anxiety worse first in AM and then later at night with mind racing on stressors.   Still back trouble.  05/23/23 appt noted: Current psych meds: Wellbutrin XL 150 mg BID and started Auvelity 1 AM, fluvoxamine 100 mg in AM and 300 mg HS, lorazepam 1 mg 1-2 mg in the AM and HS and 1 tablet prn midday for anxiety, temazepam 30 mg HS No SE Patient received Spravato 84 mg today.  She tolerated it well without any unusual headache, nausea or vomiting or other somatic symptoms.  Dissociation did occur and she gradually saw resolution over the 2-hour period of observation.  She does not typically find the dissociation very strong.  It gets rid of negative emotion for awhile after procedure and would like it to last longer.   Still pleased with benefit from [facility] for mood and anxiety by 50%. No SE complaints with meds. Chronic family stress interferes with mental health and self care.  May have to reduce frequency of Spravato bc of this. But is status quo with meds.  Chronic residual OCD which is mod severe and dep moderate.  05/30/23 appt noted: Current psych meds: Wellbutrin XL 150 mg BID and started Auvelity 1 AM, fluvoxamine 100 mg in AM and 300 mg HS, lorazepam 1 mg 1-2 mg in the AM and HS and 1 tablet prn midday for anxiety, temazepam 30 mg HS No SE Patient received Spravato 84 mg  today.  She tolerated it well without any unusual headache, nausea or vomiting or other somatic symptoms.  Dissociation did occur and she gradually saw resolution over the 2-hour period of observation.  She does not typically find the dissociation very strong.  It gets rid of negative emotion for awhile after procedure and would like it to last longer.   Still pleased with benefit from [facility] for mood and anxiety by 50% or better but not resolved. She wants to continue Spravato as frequently as schedule will allow. Both depression and OCD are better with most improvement in mood.  Asks about any new treatments for OCD. No SE complaints with meds. Chronic family stress interferes with mental health and self care.  This is an ongoing drain on her mood and resources emotionally.    06/06/23 appt noted: Current psych meds: Wellbutrin XL 150 mg BID and started Auvelity 1 AM, fluvoxamine 100 mg in AM and 300 mg HS, lorazepam 1 mg 1-2 mg in the AM and HS and 1 tablet prn midday for anxiety, temazepam 30 mg HS No SE Patient received Spravato 84 mg today.  She tolerated it well without any unusual headache, nausea or vomiting or other somatic symptoms.  Dissociation did occur and she gradually saw resolution over the 2-hour period of observation.  She does not typically find the dissociation very strong.  It gets rid of negative emotion for awhile after procedure and would like it to last longer.   Still believes each meds work and are helpful and well tolerated.  Specifically she thinks that the Auvelity adds additional benefit on taking Wellbutrin.  She believes the meds help both depression and OCD though the OCD remains a problem.  She also believes Spravato helps both types of symptoms as well.  Her mood is variable.  She experiences a great deal of stress from her family and those problems wax and wane.  She has bad days because of it and today is 1 of those days.  She is continuing counseling and that is  helpful.  Due to family demands she may have to spread out the Spravato frequency.  06/16/23 appt noted:  Current psych meds: Wellbutrin XL 150 mg BID and started Auvelity 1 AM, fluvoxamine 100 mg in AM and 300 mg HS, lorazepam 1 mg 1-2 mg in the AM and HS and 1 tablet prn midday for anxiety, temazepam 30 mg HS No SE Patient received Spravato 84 mg today.  She tolerated it well without any unusual headache, nausea or vomiting or other somatic symptoms.  Dissociation did occur and she gradually saw resolution over the 2-hour period of observation.  She does not typically find the dissociation very strong.  It gets rid of negative emotion for awhile after procedure . She is trying to make the Schedule work for [employer] oh every week or every other week because she is aware the treatment effects can be lost if it is time less frequently.  So she has been able to come the last 2 weeks, we will try to come in 2 weeks.  Spravato reduces depression and OCD significantly though she remains highly symptomatic with both.  However she has no suicidal thoughts.  Crying spells are reduced with Spravato.  She still spends a fair amount of time meaning over an hour a day checking and more under stress or when tired.  She asks about any new meds for OCD. Plan: increase Auvelity 1 in AM & PM Hold Wellbutrin XL 150 mg BID  07/12/23 appt noted: Current psych meds: Wellbutrin XL 150 mg BID and started [employer]  1 AM, fluvoxamine 100 mg in AM and 300 mg HS, lorazepam 1 mg 1-2 mg in the AM and HS and 1 tablet prn midday for anxiety, temazepam 30 mg HS.  Didn't increase Auvelity yet No SE Patient received Spravato 84 mg today.  She tolerated it well without any unusual headache, nausea or vomiting or other somatic symptoms.  Dissociation did occur and she gradually saw resolution over the 2-hour period of observation.  She does not typically find the dissociation very strong.  It gets rid of negative emotion for awhile after  procedure . She has not increased the Auvelity yet is planned.  She has a little bit of nervousness about it but admits is not rational.  She is willing to give that a try to try to get better control of depression and anxiety.  She continues to see the benefit of Spravato every other week.  She is struggling with some pain due to plantar fasciitis and given her cerebral palsy that makes it even more difficult to walk and to engage in normal activities.  She is willing to seek treatment for that.  07/25/23 appt noted: Current psych meds: Wellbutrin XL 150 mg AM and started Auvelity 1 BID, fluvoxamine 100 mg in AM and 300 mg HS, lorazepam 1 mg 1-2 mg in the AM and HS and 1 tablet prn midday for anxiety, temazepam 30 mg HS.  Didn't increase Auvelity yet No SE Patient received Spravato 84 mg today.  She tolerated it well without any unusual headache, nausea or vomiting or other somatic symptoms.  Dissociation did occur and she gradually saw resolution over the 2-hour period of observation.  She does not typically find the dissociation very strong.  It gets rid of negative emotion for awhile after procedure . Chronic dep and anxiety to some extent.  But has increased Auvelity to BID recently and no SE yet.  Disc frequency Spravato bc benefit and will try to continue every other week and not less if possible. No further concerns for meds.  08/08/23 appt noted: Current psych meds: Wellbutrin XL 150 mg AM and Auvelity 1 BID, fluvoxamine 100 mg in AM and 300 mg HS, lorazepam 1 mg 1-2 mg in the AM and HS and 1 tablet prn midday for anxiety, temazepam 30 mg HS.  Didn't increase Auvelity yet No SE with med changes. Patient received Spravato 84 mg today.  She tolerated it well without any unusual headache, nausea or vomiting or other somatic symptoms.  Dissociation did occur and she gradually saw resolution over the 2-hour period of observation.  She does not typically find the dissociation very strong.  It gets rid  of negative emotion for awhile after procedure . Mornings still worse with dep and anxiety.  But mood and anxiety are better with the increase in Auvelity to BID.  OCD seems a little better.  Chronic difficulty handling the complaints and stress from her family.  Easily stresser her out.  No med changes desire. She will have to take a break from [facility] DT pending foot surgery.  Mobility and transportation will be limited.  08/25/23 appt noted: Current psych meds: Wellbutrin XL 150 mg AM and Auvelity 1 BID, fluvoxamine 100 mg in AM and 300 mg HS, lorazepam 1 mg 1-2 mg in the AM and HS and 1 tablet prn midday for anxiety, temazepam 30 mg HS.  Didn't increase Auvelity yet No SE with med changes. Patient received Spravato 84 mg today.  She tolerated it well  without any unusual headache, nausea or vomiting or other somatic symptoms.  Dissociation did occur and she gradually saw resolution over the 2-hour period of observation.  She does not typically find the dissociation very strong.  It gets rid of negative emotion for awhile after procedure . Pending foot surgery next week.  Able to work in [employer] this week which helps with dep and anxiety and OCD.  Struggles with sleep without multiple meds as noted.  No adverse effects.  Will return to Spravato asap after foot surgery  10/04/23 appt noted: Current psych meds: Wellbutrin XL 150 mg AM and Auvelity 1 BID, fluvoxamine 100 mg in AM and 300 mg HS, lorazepam 1 mg 1-2 mg in the AM and HS and 1 tablet prn midday for anxiety, temazepam 30 mg HS.  Didn't increase Auvelity yet No SE with med changes. Patient received Spravato 84 mg today.  She tolerated it well without any unusual headache, nausea or vomiting or other somatic symptoms.  Dissociation did occur and she gradually saw resolution over the 2-hour period of observation.  She does not typically find the dissociation very strong.  It gets rid of negative emotion for awhile after procedure . Mood and  anxiety stable.  Benefit with meds and Spravato.   She reports OCD time consumption typically is much better than in the past when it was routinely 2 hours daily.  Now less than have of that.    Chronic caregiver stress with dysfunctional family and has trouble with boundaries.  Working on that in therapy.  10/24/23 appt noted: Current psych meds: Wellbutrin XL 150 mg AM and Auvelity 1 BID, fluvoxamine 100 mg in AM and 300 mg HS, lorazepam 1 mg 1-2 mg in the AM and HS and 1 tablet prn midday for anxiety, temazepam 30 mg HS.  No SE with med changes. She plans to stop Spravato bc couldn't get adequate transportation.  Her mood is continuing to be highly reactive with what's going on with family esp mother and brother.  Mother self absorbed and can be mean yet demanding.   She doesn't fear getting worse if she stops Spravato though this has happened in the past.   Lately less dep though can be pulled down quickly by family of origin interactions.  Narcissistic mother.  Thinking of PT work to try to keep her mind more occupied. Plan no changes  01/15/24 appt noted: Current psych meds: Wellbutrin XL 150 mg AM and Auvelity 1 BID, fluvoxamine 100 mg in AM and 300 mg HS, lorazepam 1 mg 1-2 mg in the AM and HS and 1 tablet prn midday for anxiety, temazepam 30 mg HS. No SE with med changes. Patient received Spravato 84 mg today.  She tolerated it well without any unusual headache, nausea or vomiting or other somatic symptoms.  Dissociation did occur and she gradually saw resolution over the 2-hour period of observation.  She does not typically find the dissociation very strong.  It gets rid of negative emotion for awhile after procedure . She wanted to resume Spravato bc 30% benefit for dep and anxiety.  Chronic struggles with OCD and family stress and handles it better when on Spravato. No problems with meds and no desire for changes.  01/16/24 appt noted: Current psych meds: Wellbutrin XL 150 mg AM and  Auvelity 1 BID, fluvoxamine 100 mg in AM and 300 mg HS, lorazepam 1 mg 1-2 mg in the AM and HS and 1 tablet prn midday for anxiety, temazepam  30 mg HS. No SE with med changes. Patient received Spravato 84 mg today.  She tolerated it well without any unusual headache, nausea or vomiting or other somatic symptoms.  Dissociation did occur and she gradually saw resolution over the 2-hour period of observation.  She does not typically find the dissociation very strong.  It gets rid of negative emotion for awhile after procedure . She wanted to resume Spravato bc 30% benefit for dep and anxiety.  Chronic struggles with OCD and family stress and handles it better when on Spravato. Asks about dropping out of the Wellbutrin.  01/23/24 appt noted: Current psych meds: Wellbutrin XL 150 mg AM and Auvelity 1 BID, fluvoxamine 100 mg in AM and 300 mg HS, lorazepam 1 mg 1-2 mg in the AM and HS and 1 tablet prn midday for anxiety, temazepam 30 mg HS. No SE with med changes. Patient received Spravato 84 mg today.  She tolerated it well without any unusual headache, nausea or vomiting or other somatic symptoms.  Dissociation did occur and she gradually saw resolution over the 2-hour period of observation.  She does not typically find the dissociation very strong.  It gets rid of negative emotion for awhile after procedure . She wanted to resume Spravato bc 30% benefit for dep and anxiety.  Chronic struggles with OCD and family stress and handles it better when on Spravato. She is getting benefit from the Spravato for depression and anxiety   Previous psych med trials include Prozac, paroxetine, sertraline, fluvoxamine, venlafaxine, Anafranil with no response,  Wellbutrin, Viibryd, Trintellix 10 1 month NR Auvelity BID   Geodon,  risperidone, Rexulti, Abilify,  Seroquel, Latuda 40 mg with irritability.   lamotrigine lithium,  BuSpar, Namenda,  pramipexole with no response, and Topamax, pindolol  2 brothers & 1  sister poverty, 1 B with untreated bipolar disorder lives with mother  ECT-MADRS    Flowsheet Row Office Visit from 06/29/2021 in [facility]  MADRS Total Score 36      Flowsheet Row Admission (Discharged) from 06/11/2021 in  PERIOPERATIVE AREA  C-SSRS RISK CATEGORY No Risk       Review of Systems:  Review of Systems  Constitutional:  Positive for fatigue.  Cardiovascular:  Negative for palpitations.  Musculoskeletal:  Positive for arthralgias, back pain, gait problem, myalgias and neck pain.  Neurological:  Positive for weakness and headaches.  Psychiatric/Behavioral:  Positive for dysphoric mood and sleep disturbance. Negative for suicidal ideas. The patient is nervous/anxious.     Medications: I have reviewed the patient's current medications.  Current Outpatient Medications  Medication Sig Dispense Refill   Abaloparatide (TYMLOS) 3120 MCG/1.56ML SOPN Inject into the skin.     AUVELITY 45-105 MG TBCR TAKE 1 TABLET BY MOUTH IN THE MORNING AND AT BEDTIME 60 tablet 1   Azelastine-Fluticasone 137-50 MCG/ACT SUSP Place 1-2 sprays into both nostrils daily.     baclofen (LIORESAL) 10 MG tablet Take 20 mg by mouth at bedtime as needed for muscle spasms.     buPROPion (WELLBUTRIN XL) 150 MG 24 hr tablet TAKE 1 TABLET (150 MG TOTAL) BY MOUTH IN THE MORNING 90 tablet 0   dicyclomine (BENTYL) 10 MG capsule Take 10 mg by mouth daily.     docusate sodium (COLACE) 100 MG capsule Take 1 capsule (100 mg total) by mouth 2 (two) times daily. (Patient taking differently: Take 100 mg by mouth daily.) 10 capsule 0   Esketamine HCl, 84 MG Dose, (SPRAVATO, 84  MG DOSE,) 28 MG/DEVICE SOPK USE 3 SPRAYS IN EACH NOSTRIL ONCE A WEEK 3 each 1   fexofenadine (ALLEGRA) 180 MG tablet Take 180 mg by mouth daily.     fluvoxaMINE (LUVOX) 100 MG tablet TAKE 1 TABLET IN THE AM AND 3 TABLETS AT NIGHT 120 tablet 2   hydrocortisone (ANUSOL-HC) 2.5 % rectal cream Place rectally 2  (two) times daily. x 7-14 days 30 g 0   ketotifen (ZADITOR) 0.025 % ophthalmic solution Place 3 drops into both eyes at bedtime.     LORazepam (ATIVAN) 1 MG tablet TAKE 1-2 IN AM,1-2 TABS EVERY NIGHT AT BEDTIME AND 1 TAB IN AFTERNOON WHEN NEEDED FOR ANXIETY/SLEEP 150 tablet 1   magnesium gluconate (MAGONATE) 500 MG tablet Take 500 mg by mouth daily.     meloxicam (MOBIC) 15 MG tablet Take 1 tablet (15 mg total) by mouth daily. 30 tablet 0   meloxicam (MOBIC) 15 MG tablet TAKE 1 TABLET (15 MG TOTAL) BY MOUTH DAILY. 30 tablet 0   methylPREDNISolone (MEDROL DOSEPAK) 4 MG TBPK tablet Take as directed 21 each 0   MIBELAS 24 FE 1-20 MG-MCG(24) CHEW Chew 1 tablet by mouth at bedtime as needed (bowel regularity).     Multiple Vitamins-Minerals (ADULT GUMMY PO) Take 2 tablets by mouth in the morning.     nitrofurantoin (MACRODANTIN) 100 MG capsule Take 100 mg by mouth as needed (For urinary tract infection.).      oxyCODONE-acetaminophen (PERCOCET/ROXICET) 5-325 MG tablet Take 1-2 tablets by mouth every 6 (six) hours as needed for severe pain. 50 tablet 0   polyethylene glycol (MIRALAX / GLYCOLAX) packet Take 17 g by mouth daily as needed for mild constipation. 14 each 0   propranolol ER (INDERAL LA) 60 MG 24 hr capsule TAKE 1 CAPSULE BY MOUTH EVERY DAY 90 capsule 0   psyllium (METAMUCIL) 58.6 % powder Take 1 packet by mouth daily as needed (constipation).     SUMAtriptan (IMITREX) 100 MG tablet Take 1 tablet (100 mg total) by mouth every 2 (two) hours as needed for migraine. May repeat in 2 hours if headache persists or recurs. 10 tablet 2   temazepam (RESTORIL) 30 MG capsule Take 1 capsule by mouth at bedtime as needed for sleep 30 capsule 0   Vitamin D-Vitamin K (VITAMIN K2-VITAMIN D3 PO) Take 1-2 sprays by mouth daily.     No current facility-administered medications for this visit.    Medication Side Effects: None   Allergies:  Allergies  Allergen Reactions   Hydrocodone Itching    Sulfamethoxazole-Trimethoprim Itching   Dust Mite Extract Other (See Comments)    Sneezing, watery eyes, runny nose   Latex Itching   Other Other (See Comments)    PT IS ALLERGIC TO CAT DANDER AND RAGWEED - Sneezing, watery eyes, runny nose    Pollen Extract Other (See Comments)    Sneezing, watery eyes, runny nose     Past Medical History:  Diagnosis Date   Abnormal Pap smear 2011   hpv/mild dysplasia,cin1   Anxiety    Cerebral palsy (HCC)    right arm/leg   Cystocele    Depression    Headache    Neuromuscular disorder (HCC)    Cerebral Palsy   OCD (obsessive compulsive disorder)    Osteoporosis    Uterine prolaps     Family History  Problem Relation Age of Onset   Cancer Father        skin AND LUNG   Alcohol abuse  Sister        CRACK COCAINE    Social History   Socioeconomic History   Marital status: Married    Spouse name: Not on file   Number of children: Not on file   Years of education: Not on file   Highest education level: Not on file  Occupational History   Not on file  Tobacco Use   Smoking status: Never   Smokeless tobacco: Never  Substance and Sexual Activity   Alcohol use: Not Currently    Comment: OCCASIONAL beer   Drug use: No   Sexual activity: Yes    Birth control/protection: Pill    Comment: LOESTRIN 24 FE  Other Topics Concern   Not on file  Social History Narrative   Not on file   Social Drivers of Health   Financial Resource Strain: Not on file  Food Insecurity: Not on file  Transportation Needs: Not on file  Physical Activity: Not on file  Stress: Not on file  Social Connections: Not on file  Intimate Partner Violence: Not on file    Past Medical History, Surgical history, Social history, and Family history were reviewed and updated as appropriate.   Please see review of systems for further details on the patient's review from today.   Objective:   Physical Exam:  LMP  (LMP Unknown)   Physical  Exam Constitutional:      General: She is not in acute distress. Neurological:     Mental Status: She is alert and oriented to person, place, and time.     Cranial Nerves: No dysarthria.     Motor: Weakness present.     Gait: Gait abnormal.  Psychiatric:        Attention and Perception: Attention and perception normal.        Mood and Affect: Mood is anxious and depressed.        Speech: Speech normal. Speech is not slurred.        Behavior: Behavior normal. Behavior is cooperative.        Thought Content: Thought content normal. Thought content is not delusional. Thought content does not include homicidal or suicidal ideation. Thought content does not include suicidal plan.        Cognition and Memory: Cognition and memory normal. Cognition is not impaired.        Judgment: Judgment normal.     Comments: Insight intact Ongoing OCD remains fairly severe but less anxious Checking compulsions now about 30 mins Chronic depression persistent but better than it was a year ago better than before Spravato       Lab Review:     Component Value Date/Time   NA 138 06/11/2021 0606   K 4.0 06/11/2021 0606   CL 107 06/11/2021 0606   CO2 26 06/11/2021 0606   GLUCOSE 90 06/11/2021 0606   BUN 18 06/11/2021 0606   CREATININE 0.81 06/11/2021 0606   CALCIUM 9.4 06/11/2021 0606   PROT 6.5 06/11/2021 0606   ALBUMIN 3.3 (L) 06/11/2021 0606   AST 17 06/11/2021 0606   ALT 14 06/11/2021 0606   ALKPHOS 141 (H) 06/11/2021 0606   BILITOT 0.2 (L) 06/11/2021 0606   GFRNONAA >60 06/11/2021 0606   GFRAA >60 07/09/2016 0438       Component Value Date/Time   WBC 5.8 06/11/2021 0606   RBC 4.12 06/11/2021 0606   HGB 12.5 06/11/2021 0606   HCT 39.7 06/11/2021 0606   PLT 299 06/11/2021 0606   MCV  96.4 06/11/2021 0606   MCH 30.3 06/11/2021 0606   MCHC 31.5 06/11/2021 0606   RDW 13.9 06/11/2021 0606   LYMPHSABS 1.9 06/11/2021 0606   MONOABS 0.5 06/11/2021 0606   EOSABS 0.1 06/11/2021 0606    BASOSABS 0.0 06/11/2021 0606    No results found for: "POCLITH", "LITHIUM"   No results found for: "PHENYTOIN", "PHENOBARB", "VALPROATE", "CBMZ"   .res Assessment: Plan:    Casey "[name]" was seen today for follow-up, depression, anxiety, fatigue and sleeping problem.  Diagnoses and all orders for this visit:  Recurrent major depression resistant to treatment (HCC) (r/o MDD+Dysthymia)  Mixed obsessional thoughts and acts  Social anxiety disorder  Migraine without aura and without status migrainosus, not intractable  Insomnia due to mental condition  Caregiver stress      Both primary Dx of OCD and major depression are TR and marked.  Impaired function but less so with Spravato re: depression..   She has been receiving Spravato 84 mg weekly and moderate improvement in the depression..  she feels it also helps OCD somewhat.  However still easily overwhelmed with low stress tolerance.  Family contributes to her anxiety and stress markedly.  The OCD is improved with the increase in fluvoxamine and with Spravato.  Spends up to 2 hours daily and checking compulsions on her worst days but better when she travels.  No new options for tx are evident.  She has been on higher doses of fluvoxamine above the usual max of 400 mg daily in the past and finds it more helpful at the higher dose.    Disc SE.   She is tolerating the meds well  Continue  Luvox back to 400 mg nightly as of January 2023. Disc dosing higher than usual.  She feels this is increase has helped more with OCD which remains chronically severe.  Continue Auvelity BID and reduced Wellbutrin to 1 AM. It has helped and is tolerated.   We have discussed seizure risk that is possible using this combination but given severity of her symptoms she feels the risk is warranted.  Do not consider stopping this until she gets the full benefit of Spravato.  Disc dosing again. There are few other alternative medication options that  remain.   Disc DDI issues.   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 She doesn't want to try this now.  We discussed the short-term risks associated with benzodiazepines including sedation and increased fall risk among others.  Discussed long-term side effect risk including dependence, potential withdrawal symptoms, and the potential eventual dose-related risk of dementia.  But recent studies from 2020 dispute this association between benzodiazepines and dementia risk. Newer studies in 2020 do not support an association with dementia. Disc this is high dose and not ideal.  Also disc risk combining it with temazepam. Rec try gradually reduce HS lorazepam to 1 mg Hs.  Can continue lorazepam 2 mg AM and 1 mg in afternoon bc of chronic anxiety and it is helpful and tolerated. She can continue temazepam 30 mg nightly.  She tends to have a lot of anxious negative thoughts at night when she is trying to go to bed.  She is trying to reduce the dose.  Complaining of HA and history migraine.  Asks for increase imitrex and disc preventatives like propranolol ER imitrex to 100 mg prn migraine and propranolol ER for migraine prevention.  Counseling 30 min on how strongly her family's behavior affects her mood.  Looked at some reasons for this and techniques to address her mood and response.  Disc mother and Brothers in particular Disc trasportation problems limiting access to [employer].  Disc alternative options.   Also working on trying to develop social and spiritual goals.  No med changes  continue Auvelity 1 in AM & PM continue Wellbutrin XL 150 mg AM Fluvoxamine 100 mg AM and 300 mg PM above usual max bc med necessary Lorazepam 2 mg HS and prn 1 mg BID prn anxiety daily Temazepam 30 mg HS Propranolol ER 60 mg daily Imitrex prn migraine  She wants to resume Spravato now bc dep worse without it.  FU 2-weeks   Meredith Staggers, MD, DFAPA  Please see After Visit Summary for  patient specific instructions.  Future Appointments  Date Time Provider Department Center  01/25/2024  1:00 PM Robley Fries, PhD [facility] None  01/26/2024  9:45 AM Candelaria Stagers, DPM [facility]  02/06/2024  3:00 PM Cottle, Steva Ready., MD [facility] None  02/06/2024  3:00 PM CP-NURSE [facility] None  02/08/2024  1:00 PM Robley Fries, PhD [facility] None  03/07/2024  1:00 PM Robley Fries, PhD [facility] None  03/27/2024  1:00 PM Robley Fries, PhD [facility] None  04/10/2024  1:00 PM Robley Fries, PhD [facility] None    No orders of the defined types were placed in this encounter.    -------------------------------

## 2024-01-23 NOTE — Progress Notes (Signed)
 NURSE NOTE:   Pt arrived for her #68 Spravato Treatment, she has been trying to come weekly for now. She started Spravato treatments on 10/07/2021, she continues with 84 mg (3 of the 28 mg) Spravato nasal spray for treatment resistance depression. Pt is being treated for Treatment Resistant Depression, Pt taken to treatment room. Pt's medication is charged through [employer].  Medication is stored behind 2 locked doors, it is never given to the pt until time of administration which is observed by the nurse. Disposed of per FDA/REMS regulations. All Spravato Treatments are documented in Spravato REMS per protocol of being a treatment center. Spravato is a CIII medication and has to be only given at a treatment facility and observed by nurse as pt administered intranasally   Pt's mobility is a bit slower than usual since her fall. She was directed to the treatment room to get vitals taken first. Initial vital signs are B/P at 3:10 PM 109/82, 102, SpO2 94%. Pt instructed to blow her nose and to recline back at 45 degrees. Pt given first nasal spray (28 mg) administered by pt observed by nurse. There were 5 minutes between each dose, total of 84 mg. Tolerated well. Assessed pt's 40 minute vital signs at 4:15 PM 113/90, 84, SpO2 92%.  Pt met with [name] and they discussed her care at the end of her treatment when her thoughts are clearer and her medication and moods. She does go to the bathroom at least once during her treatment. Dissociation resolved prior to discharge.  Discharge vitals at 5:09 PM 122/97, 78, SpO2 92%. Pt stable for discharge.  Pt was observed on site a total of 120 minutes per FDA/REMS requirements. Pt was with nurse for clinical assessment 50 minutes. She plans on returning in two weeks.    LOT [id number] EXP OCT 2027

## 2024-01-25 ENCOUNTER — Ambulatory Visit: Payer: 59 | Admitting: Psychiatry

## 2024-01-26 ENCOUNTER — Telehealth: Payer: Self-pay | Admitting: Psychiatry

## 2024-01-26 ENCOUNTER — Other Ambulatory Visit: Payer: Self-pay | Admitting: Psychiatry

## 2024-01-26 ENCOUNTER — Ambulatory Visit (INDEPENDENT_AMBULATORY_CARE_PROVIDER_SITE_OTHER): Admitting: Podiatry

## 2024-01-26 DIAGNOSIS — R2681 Unsteadiness on feet: Secondary | ICD-10-CM

## 2024-01-26 DIAGNOSIS — G809 Cerebral palsy, unspecified: Secondary | ICD-10-CM | POA: Diagnosis not present

## 2024-01-26 DIAGNOSIS — M778 Other enthesopathies, not elsewhere classified: Secondary | ICD-10-CM

## 2024-01-26 DIAGNOSIS — F4010 Social phobia, unspecified: Secondary | ICD-10-CM

## 2024-01-26 NOTE — Progress Notes (Signed)
 Subjective:  Patient ID: Casey Diaz, female    DOB: 1966-04-16,  MRN: [id number]  Chief Complaint  Patient presents with   Plantar Fasciitis    58 y.o. female presents with the above complaint.  Patient presents with new complaint of right ankle pain that has been off for quite some time is progressive gotten worse worse with ambulation is with pressure at this is likely aggravated by right AFO brace secondary to foot drop.  She states that the brace is doing okay.  She has a little preliminary discrepancy I would like to surgically build some type of padding to increase/diminish the limb length.  She states understanding will see Trish for customization.   Review of Systems: Negative except as noted in the HPI. Denies N/V/F/Ch.  Past Medical History:  Diagnosis Date   Abnormal Pap smear 2011   hpv/mild dysplasia,cin1   Anxiety    Cerebral palsy (HCC)    right arm/leg   Cystocele    Depression    Headache    Neuromuscular disorder (HCC)    Cerebral Palsy   OCD (obsessive compulsive disorder)    Osteoporosis    Uterine prolaps     Current Outpatient Medications:    Abaloparatide (TYMLOS) 3120 MCG/1.56ML SOPN, Inject into the skin., Disp: , Rfl:    AUVELITY  45-105 MG TBCR, TAKE 1 TABLET BY MOUTH IN THE MORNING AND AT BEDTIME, Disp: 60 tablet, Rfl: 1   Azelastine-Fluticasone  137-50 MCG/ACT SUSP, Place 1-2 sprays into both nostrils daily., Disp: , Rfl:    baclofen  (LIORESAL ) 10 MG tablet, Take 20 mg by mouth at bedtime as needed for muscle spasms., Disp: , Rfl:    buPROPion  (WELLBUTRIN  XL) 150 MG 24 hr tablet, TAKE 1 TABLET (150 MG TOTAL) BY MOUTH IN THE MORNING, Disp: 90 tablet, Rfl: 0   dicyclomine (BENTYL) 10 MG capsule, Take 10 mg by mouth daily., Disp: , Rfl:    docusate sodium  (COLACE) 100 MG capsule, Take 1 capsule (100 mg total) by mouth 2 (two) times daily. (Patient taking differently: Take 100 mg by mouth daily.), Disp: 10 capsule, Rfl: 0   Esketamine HCl, 84 MG Dose,  (SPRAVATO , 84 MG DOSE,) 28 MG/DEVICE SOPK, USE 3 SPRAYS IN EACH NOSTRIL ONCE A WEEK, Disp: 3 each, Rfl: 1   fexofenadine (ALLEGRA) 180 MG tablet, Take 180 mg by mouth daily., Disp: , Rfl:    fluvoxaMINE  (LUVOX ) 100 MG tablet, TAKE 1 TABLET IN THE AM AND 3 TABLETS AT NIGHT, Disp: 120 tablet, Rfl: 2   hydrocortisone  (ANUSOL -HC) 2.5 % rectal cream, Place rectally 2 (two) times daily. x 7-14 days, Disp: 30 g, Rfl: 0   ketotifen  (ZADITOR ) 0.025 % ophthalmic solution, Place 3 drops into both eyes at bedtime., Disp: , Rfl:    LORazepam  (ATIVAN ) 1 MG tablet, TAKE 1-2 TABS EVERY MORNING, 1-2 TABS AT BEDTIME & 1 TAB IN THE AFTERNOON WHEN NEEDED -ANXIETY/SLEEP, Disp: 150 tablet, Rfl: 1   magnesium gluconate (MAGONATE) 500 MG tablet, Take 500 mg by mouth daily., Disp: , Rfl:    meloxicam  (MOBIC ) 15 MG tablet, Take 1 tablet (15 mg total) by mouth daily., Disp: 30 tablet, Rfl: 0   meloxicam  (MOBIC ) 15 MG tablet, TAKE 1 TABLET (15 MG TOTAL) BY MOUTH DAILY., Disp: 30 tablet, Rfl: 0   methylPREDNISolone  (MEDROL  DOSEPAK) 4 MG TBPK tablet, Take as directed, Disp: 21 each, Rfl: 0   MIBELAS 24 FE 1-20 MG-MCG(24) CHEW, Chew 1 tablet by mouth at bedtime as needed (bowel regularity).,  Disp: , Rfl:    Multiple Vitamins-Minerals (ADULT GUMMY PO), Take 2 tablets by mouth in the morning., Disp: , Rfl:    nitrofurantoin (MACRODANTIN) 100 MG capsule, Take 100 mg by mouth as needed (For urinary tract infection.). , Disp: , Rfl:    oxyCODONE -acetaminophen  (PERCOCET/ROXICET) 5-325 MG tablet, Take 1-2 tablets by mouth every 6 (six) hours as needed for severe pain., Disp: 50 tablet, Rfl: 0   polyethylene glycol (MIRALAX  / GLYCOLAX ) packet, Take 17 g by mouth daily as needed for mild constipation., Disp: 14 each, Rfl: 0   propranolol  ER (INDERAL  LA) 60 MG 24 hr capsule, TAKE 1 CAPSULE BY MOUTH EVERY DAY, Disp: 30 capsule, Rfl: 2   psyllium (METAMUCIL) 58.6 % powder, Take 1 packet by mouth daily as needed (constipation)., Disp: , Rfl:     SUMAtriptan  (IMITREX ) 100 MG tablet, Take 1 tablet (100 mg total) by mouth every 2 (two) hours as needed for migraine. May repeat in 2 hours if headache persists or recurs., Disp: 10 tablet, Rfl: 2   temazepam  (RESTORIL ) 30 MG capsule, TAKE 1 CAPSULE BY MOUTH AT BEDTIME AS NEEDED FOR SLEEP, Disp: 30 capsule, Rfl: 0   Vitamin D-Vitamin K (VITAMIN K2-VITAMIN D3 PO), Take 1-2 sprays by mouth daily., Disp: , Rfl:   Social History   Tobacco Use  Smoking Status Never  Smokeless Tobacco Never    Allergies  Allergen Reactions   Hydrocodone  Itching   Sulfamethoxazole-Trimethoprim Itching   Dust Mite Extract Other (See Comments)    Sneezing, watery eyes, runny nose   Latex Itching   Other Other (See Comments)    PT IS ALLERGIC TO CAT DANDER AND RAGWEED - Sneezing, watery eyes, runny nose    Pollen Extract Other (See Comments)    Sneezing, watery eyes, runny nose    Objective:  There were no vitals filed for this visit. There is no height or weight on file to calculate BMI. Constitutional Well developed. Well nourished.  Vascular Dorsalis pedis pulses palpable bilaterally. Posterior tibial pulses palpable bilaterally. Capillary refill normal to all digits.  No cyanosis or clubbing noted. Pedal hair growth normal.  Neurologic Normal speech. Oriented to person, place, and time. Epicritic sensation to light touch grossly present bilaterally.  Dermatologic Nails well groomed and normal in appearance. No open wounds. No skin lesions.  Orthopedic: Pain on palpation to the right ankle pain with range of motion of the ankle joint.  No pain with plantarflexion of the ankle joint deep intra-articular ankle pain noted.  Limb length discrepancy was noted to be quarter inch difference to the right side being shorter than the left side.  38 abnormalities observed   Radiographs: None Assessment:  No diagnosis found. Plan:  Patient was evaluated and treated and all questions  answered.  Right ankle capsulitis - All questions and concerns were discussed with the patient extensively given the amount of pain that she is having she would benefit from a steroid injection of decreasing inflammatory component surgical pain.  Patient agrees with plan would like to proceed with steroid injection -A steroid injection was performed at right ankle using 1% plain Lidocaine  and 10 mg of Kenalog . This was well tolerated.  Limb length discrepancy - I explained to the patient the etiology of limb length discrepancy emergency room and options were discussed.  At this time she currently wears AFO bracing for dropfoot/cerebral palsy.  She states that it gets worn down very quickly due to having abnormality in the gait.  She  denies any other acute complaints

## 2024-01-26 NOTE — Progress Notes (Signed)
 After measuring patient she is approx 3/4" shorter on Left side opposite of AFO  Patient reports this hip had stress fracture few years back and open reduction was performed to fix hardware still in tact  Added 1/4" to left shoe under current orthotic she will try for week or two and then will schedule if she feels she would like me to add a little more to lift   Addison Bailey CPed, CFo, CFm

## 2024-01-26 NOTE — Telephone Encounter (Signed)
 Next visit is 02/05/21. [name] wants to know if it is safe to take Lorazepam and Sumatriptan together? Phone number is  [phone] (M)

## 2024-01-26 NOTE — Telephone Encounter (Signed)
 Yes

## 2024-01-26 NOTE — Telephone Encounter (Signed)
 Please see message from patient and advise.  ?

## 2024-01-26 NOTE — Telephone Encounter (Signed)
 Patient notified

## 2024-01-27 ENCOUNTER — Other Ambulatory Visit: Payer: Self-pay | Admitting: Psychiatry

## 2024-01-27 DIAGNOSIS — G43009 Migraine without aura, not intractable, without status migrainosus: Secondary | ICD-10-CM

## 2024-01-29 ENCOUNTER — Other Ambulatory Visit: Payer: Self-pay

## 2024-01-29 ENCOUNTER — Telehealth: Payer: Self-pay | Admitting: Psychiatry

## 2024-01-29 DIAGNOSIS — G43009 Migraine without aura, not intractable, without status migrainosus: Secondary | ICD-10-CM

## 2024-01-29 NOTE — Telephone Encounter (Signed)
 Pended RF for #12.  Insurance usually limits to 9/mo.

## 2024-01-29 NOTE — Telephone Encounter (Signed)
 Pt Lvm @ 9:44a stating she is still have frequent migraines and she wants to know if she can get increase in dosage of the Sumatriptan.  She said she has about 4 pills left.  She said she gets 9 at a time and wanted to know if she can get 12.  [id number], [place] - [address] [place], [place] [address] Phone: [phone]  Fax: [phone]    Next appt is 4/22 for Spravato.

## 2024-01-29 NOTE — Telephone Encounter (Signed)
 Pt reporting continued migraines and asking if she can get 12 tablets instead of 10. It looks like she has been getting 10 a month and it doesn't look like a PA has been required.

## 2024-01-30 ENCOUNTER — Encounter

## 2024-01-30 ENCOUNTER — Encounter: Admitting: Psychiatry

## 2024-02-01 ENCOUNTER — Other Ambulatory Visit: Payer: Self-pay | Admitting: Psychiatry

## 2024-02-01 DIAGNOSIS — F5105 Insomnia due to other mental disorder: Secondary | ICD-10-CM

## 2024-02-05 NOTE — Telephone Encounter (Signed)
 Precert forms faxed today along with office notes for Spravato 

## 2024-02-06 ENCOUNTER — Encounter

## 2024-02-06 ENCOUNTER — Encounter: Admitting: Psychiatry

## 2024-02-06 ENCOUNTER — Other Ambulatory Visit: Payer: Self-pay

## 2024-02-06 MED ORDER — SPRAVATO (84 MG DOSE) 28 MG/DEVICE NA SOPK: PACK | NASAL | 1 refills | Status: AC

## 2024-02-08 ENCOUNTER — Ambulatory Visit (INDEPENDENT_AMBULATORY_CARE_PROVIDER_SITE_OTHER): Admitting: Psychiatry

## 2024-02-08 DIAGNOSIS — F422 Mixed obsessional thoughts and acts: Secondary | ICD-10-CM | POA: Diagnosis not present

## 2024-02-08 DIAGNOSIS — F339 Major depressive disorder, recurrent, unspecified: Secondary | ICD-10-CM | POA: Diagnosis not present

## 2024-02-08 DIAGNOSIS — F4010 Social phobia, unspecified: Secondary | ICD-10-CM | POA: Diagnosis not present

## 2024-02-08 DIAGNOSIS — F5105 Insomnia due to other mental disorder: Secondary | ICD-10-CM

## 2024-02-08 DIAGNOSIS — Z638 Other specified problems related to primary support group: Secondary | ICD-10-CM
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
## 2024-02-10 NOTE — Progress Notes (Incomplete)
 Psychotherapy Progress Note [facility] Delora Ferry, PhD LP  Patient ID: Casey Diaz ([name])    MRN: [id number] Therapy format: Individual psychotherapy Date: 02/08/2024      Start: 1:19p     Stop: 2:07p     Time Spent: 48 min Location: In-person   Session narrative (presenting needs, interim history, self-report of stressors and symptoms, applications of prior therapy, status changes, and interventions made in session) New concern with S [name] having colonoscopy and being very frightened of it.  She also needs a driver, which puts [name] in a bind as  she is committed with [facility].  Discussed options, may be able to hand off [name].  [name] announced opportunity to work in [name] temporarily, currently on assignment this week, coming back.  Intitially, [name] balking balked that she is physically capable to manage without him, but he did go, and she has been capable, just pressured.  Some difficulty motivating [name] and needed to get [name] on the phone to back her up re getting [name] to shower.  He has been helpful where physical abilities have been needed, like moving 45-lb cat litter.  Some progress programming summer camp experiences, got accepted to two of the most prized ones.    C/o dfx sleeping, which amounts to 3-hr waking, largely attributed to going to bed anxious.  Possible she is missing yellow light beforehand, having a delayed melatonin response.    Late in session admits having a dark period last weekend -- attributes tro ankle, being alone, a friend made at [employer] distancing herself   Therapeutic modalities: {AM:23362::"Cognitive Behavioral Therapy","Solution-Oriented/Positive Psychology"}  Mental Status/Observations:  Appearance:   {PSY:22683}     Behavior:  {PSY:21022743}  Motor:  {PSY:22302}  Speech/Language:   {PSY:22685}  Affect:  {PSY:22687}  Mood:  {PSY:31886}  Thought process:  {PSY:31888}  Thought content:    {PSY:[phone]}   Sensory/Perceptual disturbances:    {[phone]}  Orientation:  {Psych Orientation:23301::"Fully oriented"}  Attention:  {Good-Fair-Poor ratings:23770::"Good"}    Concentration:  {Good-Fair-Poor ratings:23770::"Good"}  Memory:  {PSY:[phone]}  Insight:    {Good-Fair-Poor ratings:23770::"Good"}  Judgment:   {Good-Fair-Poor ratings:23770::"Good"}  Impulse Control:  {Good-Fair-Poor ratings:23770::"Good"}   Risk Assessment: Danger to Self: {Risk:22599::"No"} Self-injurious Behavior: {Risk:22599::"No"} Danger to Others: {Risk:22599::"No"} Physical Aggression / Violence: {Risk:22599::"No"} Duty to Warn: {AMYesNo:22526::"No"} Access to Firearms a concern: {AMYesNo:22526::"No"}  Assessment of progress:  {Progress:22147::"progressing"}  Diagnosis:   ICD-10-CM   1. Recurrent major depression resistant to treatment (HCC) (r/o MDD+Dysthymia)  F33.9     2. Insomnia due to mental condition  F51.05     3. Mixed obsessional thoughts and acts  F42.2     4. Social anxiety disorder  F40.10     5. Parenting stress  Z63.8      Plan:  *** Other recommendations/advice -- As may be noted above.  Continue to utilize previously learned skills ad lib. Medication compliance -- Maintain medication as prescribed and work faithfully with relevant prescriber(s) if any changes are desired or seem indicated. Crisis service -- Aware of call list and work-in appts.  Call the clinic on-call service, 988/hotline, 911, or present to [facility] or ER if any life-threatening psychiatric crisis. Followup -- No follow-ups on file.  Next scheduled visit with me 03/07/2024.  Next scheduled in this office 02/13/2024.  Maretta Shaper, PhD Delora Ferry, PhD LP Clinical Psychologist, [facility] [address] [place], [place] [address] [phone]

## 2024-02-13 ENCOUNTER — Ambulatory Visit

## 2024-02-13 ENCOUNTER — Other Ambulatory Visit: Payer: Self-pay

## 2024-02-13 ENCOUNTER — Ambulatory Visit (INDEPENDENT_AMBULATORY_CARE_PROVIDER_SITE_OTHER): Admitting: Psychiatry

## 2024-02-13 VITALS — BP 112/93 | HR 81

## 2024-02-13 DIAGNOSIS — Z638 Other specified problems related to primary support group: Secondary | ICD-10-CM

## 2024-02-13 DIAGNOSIS — F5105 Insomnia due to other mental disorder: Secondary | ICD-10-CM

## 2024-02-13 DIAGNOSIS — F339 Major depressive disorder, recurrent, unspecified: Secondary | ICD-10-CM

## 2024-02-13 DIAGNOSIS — F4010 Social phobia, unspecified: Secondary | ICD-10-CM

## 2024-02-13 DIAGNOSIS — F422 Mixed obsessional thoughts and acts: Secondary | ICD-10-CM

## 2024-02-13 DIAGNOSIS — G43009 Migraine without aura, not intractable, without status migrainosus: Secondary | ICD-10-CM

## 2024-02-13 MED ORDER — AUVELITY 45-105 MG PO TBCR: 1.0000 | EXTENDED_RELEASE_TABLET | Freq: Two times a day (BID) | ORAL | 1 refills | Status: DC

## 2024-02-13 NOTE — Telephone Encounter (Signed)
 MADRS is needed and Dr. Toi Foster aware.

## 2024-02-13 NOTE — Progress Notes (Signed)
 NURSE NOTE:   Pt arrived for her #69 Spravato  Treatment, she has been trying to come weekly for now. She started Spravato  treatments on 10/07/2021, she continues with 84 mg (3 of the 28 mg) Spravato  nasal spray for treatment resistance depression. Pt is being treated for Treatment Resistant Depression, Pt taken to treatment room. Pt's medication is charged through [employer].  Medication is stored behind 2 locked doors, it is never given to the pt until time of administration which is observed by the nurse. Disposed of per FDA/REMS regulations. All Spravato  Treatments are documented in Spravato  REMS per protocol of being a treatment center. Spravato  is a CIII medication and has to be only given at a treatment facility and observed by nurse as pt administered intranasally   Pt's mobility is a bit slower than usual since her fall. She was directed to the treatment room to get vitals taken first. Initial vital signs are B/P at 3:15 PM 118/87, 85, SpO2 92%. Pt instructed to blow her nose and to recline back at 45 degrees. Pt given first nasal spray (28 mg) administered by pt observed by nurse. There were 5 minutes between each dose, total of 84 mg. Tolerated well. Assessed pt's 40 minute vital signs at 4:00 PM 114/91, 79, SpO2 93%.  Pt met with Dr. Toi Foster and they discussed her care at the end of her treatment when her thoughts are clearer. She does go to the bathroom at least once during her treatment. Dissociation resolved prior to discharge.  Discharge vitals at 5:14 PM 112/93, 81, SpO2 93%. Pt stable for discharge.  Pt was observed on site a total of 120 minutes per FDA/REMS requirements. Pt was with nurse for clinical assessment 50 minutes. She plans on returning in two weeks.    LOT [id number] [id number] JAN [id number]

## 2024-02-13 NOTE — Progress Notes (Signed)
 Casey Diaz [id number] 04-12-1966 58 y.o.    Subjective:   Patient ID:  Casey Diaz is a 58 y.o. (DOB 1966-01-08) female.  Chief Complaint:  Chief Complaint  Patient presents with   Follow-up   Depression   Anxiety   Stress   Family Problem   Sleeping Problem     HPI [name] presents to the office today for follow-up of OCD and severe anxiety.     December 2019 visit the following was noted: No meds were changed. Lives in [name] and back for followup.  Sx are about the same.  Has to take meds with different sizes. Pt reports that mood is Anxious and Depressed and describes anxiety as Severe. Anxiety symptoms include: Excessive Worry, Obsessive Compulsive Symptoms:   Checking,,. Pt reports has interrupted sleep and nocturia. Pt reports that appetite is good. Pt reports that energy is no change and down slightly. Concentration is down slightly. Suicidal thoughts:  denied by patient. Loves the environment of [name] but misses some things there.  She's not able to work there.  H works there and likes it.  Struggled with not working, feels isolated and not up to task of meeting people.  Does attend a church and met a friend who's been helpful.  Leaving for [name] on 10/16/18.   04/09/2020 appointment the following is noted:  Staying another year in [name] bc Covid and other things. Last few months a lot of crying spells.  Is in menopause. Wonders about med changes though is nervous about it.  Crying spells associated with depressing thoughts more than stress or OCD.   Covid really hard on everyone and couldn't see family for 18 mos.  Family still very dysfunctional. No close friends in part due to OCD and depression. Son high Autism spectrum with ADHD and anxiety and she's with him all the time. Greater health problems with CP so more pains.   05/15/20 appt with the following noted: Started Estroven for menopause and helps some. Still depressed.  Chronically. In [place]  for  2 more weeks then to [name] for another year. A lot of stressors lately triggering more checking and anxiety.   OCD is her CC now and seems.  Got worse DT stress.   Stressed with Asberger's son and her health.  H works a lot.  Her FOO still stress. Plan: Trintellix 10 mg 1 tablet in the morning with food and reduce fluvoxamine  to 5 tablets nightly for 1 week  then reduce it to 4 tablets nightly.   07/02/20 appt with the following noted: Decided not to get Trintellix bc difficulty getting it. It is available.  There.  Wants to start it now.   Both depression and OCD are severe.  Not suicidal in intent or plan. Did not take samples with her to [name] but will be back in December. covid is worse there and travel is difficult.  Wants to reduce Wellbutrin  DT dry mouth. Plan: She's afraid to reduce Luvox  at this time DT fear of worsening OCD.  But will consider. Trintellix 10 mg 1 tablet in the morning with food and reduce fluvoxamine  to 5 tablets nightly for 1 week  then reduce it to 4 tablets nightly. Also reduce Wellbutrin  XL to 300 mg daily.    9-13 2022 appointment with the following noted: [place].  Broke arm a month ago and surgery.  It's all been rough adjustment.   B has cancer on his face and  M fell taking him to the doctor.  Misses the water and weather of [name].   Cry a lot more since menopause. Still depression and anxiety and OCD.  Asks about ketamine. On Wellbutrin  300, Luvox  300.  No Trintellix. Added Ativan  2 mg AM and HS and it helps.  More likely to get upset at night. Plan: Increase Luvox  back to 400 mg daily.  She thinks she's worse on less. Continue Wellbutrin  XL to 300 mg daily. Plan to start Spravato  for TRD asap   09/27/2021 appointment with the following noted:  She has started Spravato  today at 54 mg intranasally.  She tolerated it well without unusual nausea or vomiting headache or other somatic symptoms.  She did have the expected dissociation  which gradually resolved over the course of the 2-hour period of observation.  She was a little concerned about her balance given her cerebral palsy but has not noted unusual or unexpected problems.  She is motivated to can continue Spravato  in hopes of reducing her depressive symptoms. 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  She also has treatment resistant OCD which is partially managed with medications but is still quite disabling.  She is tolerating the medications well.  She is sleeping adequately.  Her appetite is adequate.  She is not having suicidal thoughts.  She continues to wish for a better treatment for OCD that would give her some relief.  09/30/2021 appointment with the following noted: She received her first dose of Spravato  84 mg intranasally today.  She tolerated it well without unusual nausea, vomiting, or other somatic symptoms.  Dissociation as expected did occur and gradually resolved over the 2-hour period of observation.  She did have a mild headache today with the treatment and received ibuprofen 600 mg at her request.  We will follow this to see if it is a pattern Patient is still depressed.  She said she was late with her medicine today and today was a particularly depressing day.  However she notes that the Spravato  has lifted her mood considerably even today.  She is hopeful that it will continue to be helpful.  No suicidal thoughts.  She has ongoing chronic anxiety and OCD at baseline.  10/04/21 appt noted: Patient received Spravato  84 mg for the second time today.  She tolerated it well without any unusual headache, nausea or vomiting or other somatic symptoms.  Dissociation did occur and she gradually [place] resolution over the 2-hour period of observation. She did not have any unusual problems after she left the office last Spravato  administration.  She did not have any specific problems with balance or walking.  She is at increased risk  of that difficulty because of cerebral palsy.  So far she has not noticed much mood effect from the medication beyond the first day of receiving it.  However she would like to continue Spravato  in hopes of getting the antidepressant effect that is desired. Stress dealing with mother's behavior at party pt hosted.  Guilt over it.  10/07/2021 appointment noted: Patient received Spravato  84 mg for the second time today.  She tolerated it well without any unusual headache, nausea or vomiting or other somatic symptoms.  Dissociation did occur and she gradually [place] resolution over the 2-hour period of observation. She still is not sure about the antidepressant effect of Spravato .  Events over the holidays and demands, make it difficult to assess.  She still notes that the OCD tends to worsen the  depression and vice versa.  She tolerates the Spravato  well and wants to continue the trial.  10/15/2021 appointment with the following noted: Patient received Spravato  84 mg for the second time today.  She tolerated it well without any unusual headache, nausea or vomiting or other somatic symptoms.  Dissociation did occur and she gradually [place] resolution over the 2-hour period of observation. Patient says it was somewhat difficult to evaluate the effect of the Spravato .  It was scheduled to be twice weekly for 4 weeks consecutively but the holidays have interfered with that administration.  She asked what specifically should be she should be looking for in order to assess improvement.  That was discussed.  The OCD is unchanged and the depression so far is not significantly different.  She still tolerates meds.  There have been no recent med changes  10/19/2021 appt noted: Patient received Spravato  84 mg for the second today.  She tolerated it well without any unusual headache, nausea or vomiting or other somatic symptoms.  Dissociation did occur and she gradually saw resolution over the 2-hour period of observation.    10/21/2021 appointment noted: Patient received Spravato  84 mg today.  She tolerated it well without any unusual headache, nausea or vomiting or other somatic symptoms.  Dissociation did occur and she gradually saw resolution over the 2-hour period of observation.  She feels better than last week.  She is not as depressed and down.  She is still dealing with grief around the death of her cousin that was unexpected.  It is still difficult to tell how much the Spravato  was doing but she is hopeful.  Anxiety is still present with the OCD.  She is not having suicidal thoughts.  She is not hopeless.  She wants to continue treatment.  10/25/2021 appointment with the following noted: Patient received Spravato  84 mg today.  She tolerated it well without any unusual headache, nausea or vomiting or other somatic symptoms.  Dissociation did occur and she gradually saw resolution over the 2-hour period of observation.  She does not typically find the dissociation very strong. She is beginning to think the Spravato  is helping somewhat with the depression.  It has been difficult to tell with the holidays intervening as well as the death of her cousin.  She has not been able to get Spravato  twice weekly for 4 weeks straight as typically planned.  However she is hopeful.  The OCD remains significant.  She still has a tendency to think very negatively.  She is not suicidal.  10/28/2021 appointment with the following noted: Patient received Spravato  84 mg today.  She tolerated it well without any unusual headache, nausea or vomiting or other somatic symptoms.  Dissociation did occur and she gradually saw resolution over the 2-hour period of observation.  She does not typically find the dissociation very strong. She is feeling more hopeful about the administration of Spravato .  She is having less depression she believes.  Still not dramatically different.  She still has a tendency to have a lot of anxiety and rumination and  OCD.  She is not suicidal.  She is eager to continue the Spravato .  11/01/2021 appointment with the following noted: Patient received Spravato  84 mg today.  She tolerated it well without any unusual headache, nausea or vomiting or other somatic symptoms.  Dissociation did occur and she gradually saw resolution over the 2-hour period of observation.  She does not typically find the dissociation very strong. She is continuing to  see a little bit of improvement in depression with Spravato .  The anxiety remains but may be not as severe.  The OCD remains markedly severe chronically.  She is not suicidal.  She is encouraged by the degree of improvement with Spravato  and inability to enjoy things more and not be quite as ruminative.  11/04/2021 appt noted: Patient received Spravato  84 mg today.  She tolerated it well without any unusual headache, nausea or vomiting or other somatic symptoms.  Dissociation did occur and she gradually saw resolution over the 2-hour period of observation.  She does not typically find the dissociation very strong. No SE complaints with meds. She continues to feel hopeful about the Spravato .  She has less depression.  Because of a number of factors she is uncertain of the full benefit but thinks she is somewhat less depressed.  Her anxiety and OCD remain significant but a little better.  She is tolerating the medications and does not desire medicine change.  She is not currently complaining of insomnia.   11/08/2021 appointment the following noted: Patient received Spravato  84 mg today.  She tolerated it well without any unusual headache, nausea or vomiting or other somatic symptoms.  Dissociation did occur and she gradually saw resolution over the 2-hour period of observation.  She does not typically find the dissociation very strong. No SE complaints with meds. She feels the Spravato  is helping somewhat.  She would like to see a greater effect.  However she is able to enjoy things.   She is productive at home.  She would like to see a lifting of a degree of sadness that remains.  The anxiety and OCD remained largely unchanged.  She wondered about the dosing of Wellbutrin  300 mg a day and Luvox  300 mg a day and possible increases.  She has been at higher doses in the past.  She plans to start water therapy for her weakness and for her shoulder.  11/11/2021 appointment with the following noted: Patient received Spravato  84 mg today.  She tolerated it well without any unusual headache, nausea or vomiting or other somatic symptoms.  Dissociation did occur and she gradually saw resolution over the 2-hour period of observation.  She does not typically find the dissociation very strong. No SE complaints with meds. She feels the Spravato  is clearly helping the depression.  She would like to see a more significant effect.  She is still having trouble thinking positive. Her energy is fair.  Concentration is good except for the problem with chronic obsessions. She has been taking Wellbutrin  300 mg in Luvox  300 mg for quite some time but has taken higher doses in the past.  We discussed that.  She would like to try higher doses in order to get a better effect if possible. We just increased the doses a couple of days ago.  No effect yet.  11/15/2021 appointment with the following noted: Patient received Spravato  84 mg today.  She tolerated it well without any unusual headache, nausea or vomiting or other somatic symptoms.  Dissociation did occur and she gradually saw resolution over the 2-hour period of observation.  She does not typically find the dissociation very strong. No SE complaints with meds. The patient is now convinced that the Spravato  is helping the depression.  She would like to continue twice weekly Spravato  this week if possible.  She has tolerated the increase in Wellbutrin  to 450 mg daily and the increase and fluvoxamine  to 400 mg daily without complications  thus far.  The OCD  and anxiety feed the depression to some extent. She spends approximately 2 hours daily with checking compulsions due to obsessions about causing harm to others.  For example fearing that when she has hit a pot hole that she may have hit a person and going back to check.  Checking corners and rooms out of fear that she may have harmed someone.  Other various checking compulsions.  She is hoping the increase in fluvoxamine  to 400 mg will reduce that over the weeks to come.  She is not seeing a significant difference with the addition of the Spravato  though she understands that was not expected.  She is more productive at home and more motivated and able to enjoy things more fully as a result of the Spravato  treatment.  She is tolerating the medication  11/18/2021 appointment with the following noted: Patient received Spravato  84 mg today.  She tolerated it well without any unusual headache, nausea or vomiting or other somatic symptoms.  Dissociation did occur and she gradually saw resolution over the 2-hour period of observation.  She does not typically find the dissociation very strong. No SE complaints with meds. She clearly believes the Spravato  has been helpful for the depression.  She wonders whether to continue to treatments weekly or to cut back to 1 weekly.  She would like to continue twice weekly in hopes of getting additional improvement in the depression because it is not resolved but it is difficult to get here twice a week in terms of arranging rides. She is recently increased Wellbutrin  XL to 450 mg daily and fluvoxamine  to 400 mg daily but they have not had time to have an official effect.  She is tolerating that well.  She is tolerating meds overwork overall well. The OCD remains the same as noted on 11/15/2021  11/25/21 appt noted: Patient received Spravato  84 mg today.  She tolerated it well without any unusual headache, nausea or vomiting or other somatic symptoms.  Dissociation did occur and  she gradually saw resolution over the 2-hour period of observation.  She does not typically find the dissociation very strong. No SE complaints with meds. She thinks the increase in Wellbutrin  and Luvox  have been potentially helpful for depression and OCD respectively.  It has been too early to see the full effect.  She is sleeping and eating well.  She is functioning at home.  She still spends a lot of time that is about 2 hours a day dealing with compulsive behaviors.  12/02/21 appt noted: Patient received Spravato  84 mg today.  She tolerated it well without any unusual headache, nausea or vomiting or other somatic symptoms.  Dissociation did occur and she gradually saw resolution over the 2-hour period of observation.  She does not typically find the dissociation very strong. No SE complaints with meds. Several losses and stressors recently that affect her sense of mood. However still sees significant benefit from the Spravato  for her depression.  Wants to continue it. Suspect early  some benefit from the increased Wellbutrin  for depression and Luvox  for OCD. Tolerating meds. No complaints about the meds. Sleeping and eating well.  No new health concerns.  12/09/21 appt noted: Patient received Spravato  84 mg today.  She tolerated it well without any unusual headache, nausea or vomiting or other somatic symptoms.  Dissociation did occur and she gradually saw resolution over the 2-hour period of observation.  She does not typically find the dissociation very strong. No  SE complaints with meds. Seeing noticeable improvement from increase fluvoxamine  to 400 mg daily.  Tolerating meds without concerns over them. Depression is stable with residual sx of easy guilt and easily stressed.  OCD contributes to depression but depression is not severe with less crying spells.  Productive at home with chores.  Enjoyed recent birthday.  Sleeping good. No new concerns.  12/23/2021 appointment noted: Patient  received Spravato  84 mg today.  She tolerated it well without any unusual headache, nausea or vomiting or other somatic symptoms.  Dissociation did occur and she gradually saw resolution over the 2-hour period of observation.  She does not typically find the dissociation very strong. No SE complaints with meds. Seeing noticeable improvement from increase fluvoxamine  to 400 mg daily.  Tolerating meds without concerns over them. Her depression is somewhat improved with the Spravato .  She also feels generally a little lighter.  She is more motivated.  She is less overwhelmed by guilt.  The OCD is gradually improving but is still quite time-consuming as noted before.  She is sleeping well.  No side effects  12/30/2021 appointment with the following noted: Patient received Spravato  84 mg today.  She tolerated it well without any unusual headache, nausea or vomiting or other somatic symptoms.  Dissociation did occur and she gradually saw resolution over the 2-hour period of observation.  She does not typically find the dissociation very strong. No SE complaints with meds. Seeing noticeable improvement from increase fluvoxamine  to 400 mg daily.  Tolerating meds without concerns over them. She is confident of her the improvement seen with Spravato .  She is less hopeless.  Guilt is marked remarkably improved.  She is not having any thoughts of death or dying.  She is more motivated for activities such as exercise which she is recently started.  She is sleeping well. The OCD remains severe but it is improving somewhat with the increase in fluvoxamine .  It is still consuming a couple hours per day.  01/10/22 apravato 84 admin  01/24/22 appt noted: Patient received Spravato  84 mg today.  She tolerated it well without any unusual headache, nausea or vomiting or other somatic symptoms.  Dissociation did occur and she gradually saw resolution over the 2-hour period of observation.  She does not typically find the  dissociation very strong. No SE complaints with meds. Very tearful today.  Feels like she has been suppressing emotion in the Spravato  caused it to be released.  Discussed some stressors.  Overall still feels the medicine is helpful.  She has missed some of the scheduled Spravato  treatments that were intended to be weekly due to circumstances beyond her control.  She is still struggling with OCD as previously noted but does believe the medications are helpful. Plan no med changes  01/31/2022 received Spravato  84 mg today  02/09/2022 appointment with the following noted: Patient received Spravato  84 mg today.  She tolerated it well without any unusual headache, nausea or vomiting or other somatic symptoms.  Dissociation did occur and she gradually saw resolution over the 2-hour period of observation.  She does not typically find the dissociation very strong. No SE complaints with meds. Spravato  clearly helps depression and OCD but easily gets overwhelmed and tearful with fairly routine stressors.  Tolerating meds. Sleep and appetite is OK Asks to increase lorazepam  to 2 mg AM and HS and 1mg  afternoon  02/16/22 appt noted: Patient received Spravato  84 mg today.  She tolerated it well without any unusual headache, nausea or  vomiting or other somatic symptoms.  Dissociation did occur and she gradually saw resolution over the 2-hour period of observation.  She does not typically find the dissociation very strong. No SE complaints with meds. She has chronic depesssion and OCD but is improved with Spravato , both dx versus before.  She has continued Luvox  400 mg and Wellbutrin  450 mg and is tolerating it.  Chronically easily stressed.  Tolerating all meds.  Doesn't like taking more meds.  Spending a couple hours daily with OCD.  No SI No med changes.  02/21/22 appt noted:   Doesn't like taking more meds.  Spending a couple hours daily with OCD.  No SI No med changes.  02/21/22 appt noted: Patient received  Spravato  84 mg today.  She tolerated it well without any unusual headache, nausea or vomiting or other somatic symptoms.  Dissociation did occur and she gradually saw resolution over the 2-hour period of observation.  She does not typically find the dissociation very strong. No SE complaints with meds. She has chronic depesssion and OCD but is improved with Spravato , both dx versus before.  She has continued Luvox  400 mg and Wellbutrin  450 mg and is tolerating it.  Chronically easily overwhelmed and doesn't know why.  Tolerating all meds. Wants to continue meds.  03/16/22 appt noted: Patient received Spravato  84 mg today.  She tolerated it well without any unusual headache, nausea or vomiting or other somatic symptoms.  Dissociation did occur and she gradually saw resolution over the 2-hour period of observation.  She does not typically find the dissociation very strong. No SE complaints with meds. Overall she still feels the Spravato  has been helpful not only for her depression but also for her OCD which was somewhat unexpected.  OCD is still significant but it is less severe than prior to starting Spravato .  She is tolerating Luvox  400 mg and Wellbutrin  450 mg.  We discussed possible med adjustments.  03/23/22 appt noted: Patient received Spravato  84 mg today.  She tolerated it well without any unusual headache, nausea or vomiting or other somatic symptoms.  Dissociation did occur and she gradually saw resolution over the 2-hour period of observation.  She does not typically find the dissociation very strong. No SE complaints with meds. She is still depressed and still has OCD of course but is improved with the Spravato .  She is tolerating the medications well.  We had previously discussed the possibility of switching some of the Wellbutrin  to Auvelity  and she is very interested in that in hopes of further improvement in depression and OCD.  She understands that Auvelity  is not used for OCD on the label.   She is tolerating the medications.  She is still easily overwhelmed.  She is sleeping and eating okay.. Plan: Reduce Wellbutrin  XL to 300 mg AM and add Auvelity  1 tablet each AM  03/30/22 appt noted: Patient received Spravato  84 mg today.  She tolerated it well without any unusual headache, nausea or vomiting or other somatic symptoms.  Dissociation did occur and she gradually saw resolution over the 2-hour period of observation.  She does not typically find the dissociation very strong. No SE complaints with meds. She is still depressed and still has OCD of course but is improved with the Spravato .  She is tolerating the medications well.  No difference with Auvelity  1 AM so far and no SE.  Going on vacation on Saturday. 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 Sleep and appetite good. Plan: Increase  Auvelity  to 1 twice daily and reduce Wellbutrin  to XL 150 every morning  04/14/2022 appointment with the following noted: Patient received Spravato  84 mg today.  She tolerated it well without any unusual headache, nausea or vomiting or other somatic symptoms.  Dissociation did occur and she gradually saw resolution over the 2-hour period of observation.  She does not typically find the dissociation very strong. No SE complaints with meds. She is still depressed and still has OCD of course but is improved with the Spravato .  She is tolerating the medications well.  Just increased Auvelity  to BID yesterday and reduced Wellbutrin  to 150 AM. No SE so far.  No change in mood or anxiety so far.  Chronic OCD as noted and residual depression and chronic fatigue.  04/21/2022 appointment with the following noted: Patient received Spravato  84 mg today.  She tolerated it well without any unusual headache, nausea or vomiting or other somatic symptoms.  Dissociation did occur and she gradually saw resolution over the 2-hour period of observation.  She does not typically find the dissociation very strong. No  SE complaints with meds. She is still depressed and still has OCD of course but is improved with the Spravato .   She has questions about the dosing of lorazepam . She tends to have negative anxious thoughts at night.  This tends to interfere with her ability to go to sleep.  She is getting about 8 to 9 hours of sleep.  She is tolerating the meds without excessive sedation and does not nap during the day.  05/06/22 appt noted: Patient received Spravato  84 mg today.  She tolerated it well without any unusual headache, nausea or vomiting or other somatic symptoms.  Dissociation did occur and she gradually saw resolution over the 2-hour period of observation.  She does not typically find the dissociation very strong. No SE complaints with meds. She is still depressed and still has OCD of course but is improved with the Spravato .   Had some questions about timing of dosing of fluvoxamine  and Auvelity . OCD is not quite as time consuming.  Sleep and eating are the same.   No SE meds.  05/25/22 appt noted: Patient received Spravato  84 mg today.  She tolerated it well without any unusual headache, nausea or vomiting or other somatic symptoms.  Dissociation did occur and she gradually saw resolution over the 2-hour period of observation.  She does not typically find the dissociation very strong. No SE complaints with meds. She is still depressed and still has OCD of course but is improved with the Spravato .   She has less OCD when away from home and on vacation of note. Plan: Rec gradually reduce HS lorazepam  to 1 mg Hs.  Can continue lorazepam  2 mg AM and 1 mg in afternoon bc of chronic anxiety and it is helpful and tolerated. She can continue temazepam  30 mg nightly.  She tends to have a lot of anxious negative thoughts at night when she is trying to go to bed  06/16/22 appt noted: Patient received Spravato  84 mg today.  She tolerated it well without any unusual headache, nausea or vomiting or other somatic  symptoms.  Dissociation did occur and she gradually saw resolution over the 2-hour period of observation.  She does not typically find the dissociation very strong. No SE complaints with meds. She is still depressed and still has OCD of course but is improved with the Spravato .   She has less OCD when away from home  and on vacation of note. She is tolerating the medications.  She has continued current medications. 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  She still receives benefit from Spravato .  Her depression and OCD are better with the Spravato .  She thinks she is getting additional benefit with the switch from Wellbutrin  to Auvelity .  07/04/2022 appointment noted: Reports she developed a rash on her face from Auvelity  and feels like she is allergic to it.  She stopped it and went back to Wellbutrin  450 mg every morning.  The rash has cleared up.  She did not require any medical attention and did not have shortness of breath. Overall her depression and OCD are about the same as they have been.  She did not notice a substantial difference from the brief treatment with Auvelity  but she understands she did not take a full course.  She is tolerating the current medicines well. Current meds fluvoxamine  400 mg daily, Wellbutrin  XL 450 mg daily, lorazepam  1 to 2 mg in the morning and 1 to 2 mg at night and 1 mg in the afternoon, temazepam  30 mg nightly. She wants to continue the Spravato  because she feels it has been helpful for both her depression and her racing OCD  07/18/22 appt noted: Patient received Spravato  84 mg today.  She tolerated it well without any unusual headache, nausea or vomiting or other somatic  symptoms.  Dissociation did occur and she gradually saw resolution over the 2-hour period of observation.  She does not typically find the dissociation very strong. No SE complaints with meds. She is still depressed and still has OCD of course but is improved with the Spravato .  Rash better off Auvelity  and back on Welllbutrin XL 450 mg AM, fluvoxamine  400 mg daily.  08/15/22 appt noted: Current psych meds: Wellbutrin  XL 450 mg AM, fluvoxamine  100 mg in AM and 300 mg HS, lorazepam  1 mg 1-2 mg in the AM and HS and 1 tablet prn midday for anxiety, temazepam  30 mg HS Patient received Spravato  84 mg today.  She tolerated it well without any unusual headache, nausea or vomiting or other somatic symptoms.  Dissociation did occur and she gradually saw resolution over the 2-hour period of observation.  She does not typically find the dissociation very strong. No SE complaints with meds. She has a great deal of stress dealing with her family.  Disc brother's ongoing mania and difficulty getting him help and the stress he causes for the family. She wants to continue Spravato  through this very stressful holdicay season and reevaluate the frequency after the New Year.  09/12/22 appt noted: Current psych meds: Wellbutrin  XL 450 mg AM, fluvoxamine  100 mg in AM and 300 mg HS, lorazepam  1 mg 1-2 mg in the AM and HS and 1 tablet prn midday for anxiety, temazepam  30 mg HS Patient received Spravato  84 mg today.  She tolerated it well without any unusual headache, nausea or vomiting or other somatic symptoms.  Dissociation did occur and she gradually saw resolution over the 2-hour period of observation.  She does not typically find the dissociation very strong. No SE complaints with meds. She has a great deal of  stress dealing with her family.  Disc brother's ongoing mania and difficulty getting him help and the stress he causes for the family. She wants to continue Spravato  through this very stressful holdicay season  and reevaluate the frequency after the New Year.  Chronically easily overwhelmed with family. Complaining of HA and history migraine.  Asks for increase imitrex  and disc preventatives like propranolol  ER  09/26/22 appt noted: Current psych meds: Wellbutrin  XL 450 mg AM, fluvoxamine  100 mg in AM and 300 mg HS, lorazepam  1 mg 1-2 mg in the AM and HS and 1 tablet prn midday for anxiety, temazepam  30 mg HS Patient received Spravato  84 mg today.  She tolerated it well without any unusual headache, nausea or vomiting or other somatic symptoms.  Dissociation did occur and she gradually saw resolution over the 2-hour period of observation.  She does not typically find the dissociation very strong. No SE complaints with meds. She has a great deal of stress dealing with her family.  This is ongoing The holidays are much more stressful DT family problems.  She is noting OCD is much worse over the last couple of week.  Depression is better with Spravato . Needed higher dose meds for migraine.   10/03/22 appt noted: Current psych meds: Wellbutrin  XL 450 mg AM, fluvoxamine  100 mg in AM and 300 mg HS, lorazepam  1 mg 1-2 mg in the AM and HS and 1 tablet prn midday for anxiety, temazepam  30 mg HS Patient received Spravato  84 mg today.  She tolerated it well without any unusual headache, nausea or vomiting or other somatic symptoms.  Dissociation did occur and she gradually saw resolution over the 2-hour period of observation.  She does not typically find the dissociation very strong. No SE complaints with meds. She has now realized that the rash she previously previously attributed to Auvelity  was not related.  She is interested may be retrying that after the holidays.  She is tolerating medications otherwise. The holidays remain chronically stressful to her due to family dynamic problems which cause her to consistently feel stuck.  Under more stress her OCD is worse.  She will have a tendency to have crying spells.   The depression and OCD are still improved with Spravato  as compared to before.  11/15/22 appt noted: Current psych meds: Wellbutrin  XL 450 mg AM, fluvoxamine  100 mg in AM and 300 mg HS, lorazepam  1 mg 1-2 mg in the AM and HS and 1 tablet prn midday for anxiety, temazepam  30 mg HS Patient received Spravato  84 mg today.  She tolerated it well without any unusual headache, nausea or vomiting or other somatic symptoms.  Dissociation did occur and she gradually saw resolution over the 2-hour period of observation.  She does not typically find the dissociation very strong. No SE complaints with meds. Continues to feel depressed and overwhelmed by family problems including her brother's mania and recent eviction and commitment.  Chronic OCD worse when stressed.  No SI.  Tolerating meds. Plan: Per her request continue Wellbutrin  XL 450 mg every morning. She has come to the realization that the rash she had previously attributed to Auvelity  was not related.  She is interested in perhaps retrying Auvelity . There are few alternative medication options that remain.  01/11/23 appt noted: Current psych meds: Wellbutrin  XL 150 mg BID and started Auvelity  1 AM, fluvoxamine  100 mg in AM and 300 mg HS, lorazepam  1 mg 1-2 mg in the AM and HS and 1 tablet prn midday  for anxiety, temazepam  30 mg HS Patient received Spravato  84 mg today.  She tolerated it well without any unusual headache, nausea or vomiting or other somatic symptoms.  Dissociation did occur and she gradually saw resolution over the 2-hour period of observation.  She does not typically find the dissociation very strong. No SE complaints with meds. Trouble getting to sessions lately DT transportation problems.   Continues to feel depressed and overwhelmed by OCD and family.  Feels she needs toevery other week Spravato  bc it helps for a couple of weeks and then seems to wear off.  Struggleing with OCD and depression both of which are eased by Spravato . Less  crying with Auvelity .  02/07/23 appt noted: Current psych meds: Wellbutrin  XL 150 mg BID and started Auvelity  1 AM, fluvoxamine  100 mg in AM and 300 mg HS, lorazepam  1 mg 1-2 mg in the AM and HS and 1 tablet prn midday for anxiety, temazepam  30 mg HS Patient received Spravato  84 mg today.  She tolerated it well without any unusual headache, nausea or vomiting or other somatic symptoms.  Dissociation did occur and she gradually saw resolution over the 2-hour period of observation.  She does not typically find the dissociation very strong. No SE complaints with meds. Trouble getting to sessions lately DT transportation problems.  This is a problem ongoing and thinks she might need to pause Spravato  bc won't be able to get her for at least 3 weeks. She is holding pretty steady with a moderate level of anxiety and depression ongoing and chronic.    03/20/23 appt noted: Current psych meds: Wellbutrin  XL 150 mg BID and started Auvelity  1 AM, fluvoxamine  100 mg in AM and 300 mg HS, lorazepam  1 mg 1-2 mg in the AM and HS and 1 tablet prn midday for anxiety, temazepam  30 mg HS Patient received Spravato  84 mg today.  She tolerated it well without any unusual headache, nausea or vomiting or other somatic symptoms.  Dissociation did occur and she gradually saw resolution over the 2-hour period of observation.  She does not typically find the dissociation very strong. No SE complaints with meds. She is trying to get back into more regular Spravato  administration.  Family issues and transportation problems that led to her missing Spravato .  She feels more depressed without regular Spravato .  Her OCD is chronic but also worse when she misses Spravato .  She does not want any medication changes.  04/03/23 appt noted: Current psych meds: Wellbutrin  XL 150 mg BID and started Auvelity  1 AM, fluvoxamine  100 mg in AM and 300 mg HS, lorazepam  1 mg 1-2 mg in the AM and HS and 1 tablet prn midday for anxiety, temazepam  30 mg  HS Patient received Spravato  84 mg today.  She tolerated it well without any unusual headache, nausea or vomiting or other somatic symptoms.  Dissociation did occur and she gradually saw resolution over the 2-hour period of observation.  She does not typically find the dissociation very strong.  It gets rid of negative emotion for awhile after procedure and would like it to last longer.   No SE complaints with meds.  Doesn't really want med changes.   Planning to weekly Spravato .  It is helping dep and anxiety.    04/10/23 appt noted: Current psych meds: Wellbutrin  XL 150 mg BID and started Auvelity  1 AM, fluvoxamine  100 mg in AM and 300 mg HS, lorazepam  1 mg 1-2 mg in the AM and HS and 1 tablet prn  midday for anxiety, temazepam  30 mg HS Patient received Spravato  84 mg today.  She tolerated it well without any unusual headache, nausea or vomiting or other somatic symptoms.  Dissociation did occur and she gradually saw resolution over the 2-hour period of observation.  She does not typically find the dissociation very strong.  It gets rid of negative emotion for awhile after procedure and would like it to last longer.   No SE complaints with meds.  Doesn't really want med changes.   Had lidocaine  shot for back pain with brief benefit.  Doing some water based PT Spravato  went well today. Got pretty upset last week with event related to son's activities.  Got upset with son saying something inappropriate publicly.  Was so embarrassed.  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    He's kind of rebellious lately.  Son is 71 yo.    05/03/23 appt noted: Current psych meds: Wellbutrin  XL 150 mg BID and started Auvelity  1 AM, fluvoxamine  100 mg in AM and 300 mg HS, lorazepam  1 mg 1-2 mg in the AM and HS and 1 tablet prn midday for anxiety, temazepam  30 mg HS No SE Patient received Spravato  84 mg today.  She tolerated it well without any unusual headache, nausea or  vomiting or other somatic symptoms.  Dissociation did occur and she gradually saw resolution over the 2-hour period of observation.  She does not typically find the dissociation very strong.  It gets rid of negative emotion for awhile after procedure and would like it to last longer.   Still pleased with benefit from Spravato  for mood and anxiety. No SE complaints with meds.  Doesn't really want med changes.   Chronic family px ongoing affects her but nothing she can change.H sick of the family drama.   Continuing counseling helps.  Has some support. Mood and anxiety pretty steady but did go on vacation to [place].  Anxiety worse first in AM and then later at night with mind racing on stressors.   Still back trouble.  05/23/23 appt noted: Current psych meds: Wellbutrin  XL 150 mg BID and started Auvelity  1 AM, fluvoxamine  100 mg in AM and 300 mg HS, lorazepam  1 mg 1-2 mg in the AM and HS and 1 tablet prn midday for anxiety, temazepam  30 mg HS No SE Patient received Spravato  84 mg today.  She tolerated it well without any unusual headache, nausea or vomiting or other somatic symptoms.  Dissociation did occur and she gradually saw resolution over the 2-hour period of observation.  She does not typically find the dissociation very strong.  It gets rid of negative emotion for awhile after procedure and would like it to last longer.   Still pleased with benefit from Spravato  for mood and anxiety by 50%. No SE complaints with meds. Chronic family stress interferes with mental health and self care.  May have to reduce frequency of Spravato  bc of this. But is status quo with meds.  Chronic residual OCD which is mod severe and dep moderate.  05/30/23 appt noted: Current psych meds: Wellbutrin  XL 150 mg BID and started Auvelity  1 AM, fluvoxamine  100 mg in AM and 300 mg HS, lorazepam  1 mg 1-2 mg in the AM and HS and 1 tablet prn midday for anxiety, temazepam  30 mg HS No SE Patient received Spravato  84 mg  today.  She tolerated it well without any unusual headache, nausea or  vomiting or other somatic symptoms.  Dissociation did occur and she gradually saw resolution over the 2-hour period of observation.  She does not typically find the dissociation very strong.  It gets rid of negative emotion for awhile after procedure and would like it to last longer.   Still pleased with benefit from Spravato  for mood and anxiety by 50% or better but not resolved. She wants to continue Spravato  as frequently as schedule will allow. Both depression and OCD are better with most improvement in mood.  Asks about any new treatments for OCD. No SE complaints with meds. Chronic family stress interferes with mental health and self care.  This is an ongoing drain on her mood and resources emotionally.    06/06/23 appt noted: Current psych meds: Wellbutrin  XL 150 mg BID and started Auvelity  1 AM, fluvoxamine  100 mg in AM and 300 mg HS, lorazepam  1 mg 1-2 mg in the AM and HS and 1 tablet prn midday for anxiety, temazepam  30 mg HS No SE Patient received Spravato  84 mg today.  She tolerated it well without any unusual headache, nausea or vomiting or other somatic symptoms.  Dissociation did occur and she gradually saw resolution over the 2-hour period of observation.  She does not typically find the dissociation very strong.  It gets rid of negative emotion for awhile after procedure and would like it to last longer.   Still believes each meds work and are helpful and well tolerated.  Specifically she thinks that the Auvelity  adds additional benefit on taking Wellbutrin .  She believes the meds help both depression and OCD though the OCD remains a problem.  She also believes Spravato  helps both types of symptoms as well.  Her mood is variable.  She experiences a great deal of stress from her family and those problems wax and wane.  She has bad days because of it and today is 1 of those days.  She is continuing counseling and that is  helpful.  Due to family demands she may have to spread out the Spravato  frequency.  06/16/23 appt noted:  Current psych meds: Wellbutrin  XL 150 mg BID and started Auvelity  1 AM, fluvoxamine  100 mg in AM and 300 mg HS, lorazepam  1 mg 1-2 mg in the AM and HS and 1 tablet prn midday for anxiety, temazepam  30 mg HS No SE Patient received Spravato  84 mg today.  She tolerated it well without any unusual headache, nausea or vomiting or other somatic symptoms.  Dissociation did occur and she gradually saw resolution over the 2-hour period of observation.  She does not typically find the dissociation very strong.  It gets rid of negative emotion for awhile after procedure . She is trying to make the Schedule work for Spravto oh every week or every other week because she is aware the treatment effects can be lost if it is time less frequently.  So she has been able to come the last 2 weeks, we will try to come in 2 weeks.  Spravato  reduces depression and OCD significantly though she remains highly symptomatic with both.  However she has no suicidal thoughts.  Crying spells are reduced with Spravato .  She still spends a fair amount of time meaning over an hour a day checking and more under stress or when tired.  She asks about any new meds for OCD. Plan: increase Auvelity  1 in AM & PM Hold Wellbutrin  XL 150 mg BID  07/12/23 appt noted: Current psych meds: Wellbutrin  XL  150 mg BID and started Auvelity  1 AM, fluvoxamine  100 mg in AM and 300 mg HS, lorazepam  1 mg 1-2 mg in the AM and HS and 1 tablet prn midday for anxiety, temazepam  30 mg HS.  Didn't increase Auvelity  yet No SE Patient received Spravato  84 mg today.  She tolerated it well without any unusual headache, nausea or vomiting or other somatic symptoms.  Dissociation did occur and she gradually saw resolution over the 2-hour period of observation.  She does not typically find the dissociation very strong.  It gets rid of negative emotion for awhile after  procedure . She has not increased the Auvelity  yet is planned.  She has a little bit of nervousness about it but admits is not rational.  She is willing to give that a try to try to get better control of depression and anxiety.  She continues to see the benefit of Spravato  every other week.  She is struggling with some pain due to plantar fasciitis and given her cerebral palsy that makes it even more difficult to walk and to engage in normal activities.  She is willing to seek treatment for that.  07/25/23 appt noted: Current psych meds: Wellbutrin  XL 150 mg AM and started Auvelity  1 BID, fluvoxamine  100 mg in AM and 300 mg HS, lorazepam  1 mg 1-2 mg in the AM and HS and 1 tablet prn midday for anxiety, temazepam  30 mg HS.  Didn't increase Auvelity  yet No SE Patient received Spravato  84 mg today.  She tolerated it well without any unusual headache, nausea or vomiting or other somatic symptoms.  Dissociation did occur and she gradually saw resolution over the 2-hour period of observation.  She does not typically find the dissociation very strong.  It gets rid of negative emotion for awhile after procedure . Chronic dep and anxiety to some extent.  But has increased Auvelity  to BID recently and no SE yet.  Disc frequency Spravato  bc benefit and will try to continue every other week and not less if possible. No further concerns for meds.  08/08/23 appt noted: Current psych meds: Wellbutrin  XL 150 mg AM and Auvelity  1 BID, fluvoxamine  100 mg in AM and 300 mg HS, lorazepam  1 mg 1-2 mg in the AM and HS and 1 tablet prn midday for anxiety, temazepam  30 mg HS.  Didn't increase Auvelity  yet No SE with med changes. Patient received Spravato  84 mg today.  She tolerated it well without any unusual headache, nausea or vomiting or other somatic symptoms.  Dissociation did occur and she gradually saw resolution over the 2-hour period of observation.  She does not typically find the dissociation very strong.  It gets rid  of negative emotion for awhile after procedure . Mornings still worse with dep and anxiety.  But mood and anxiety are better with the increase in Auvelity  to BID.  OCD seems a little better.  Chronic difficulty handling the complaints and stress from her family.  Easily stresser her out.  No med changes desire. She will have to take a break from Spravato  DT pending foot surgery.  Mobility and transportation will be limited.  08/25/23 appt noted: Current psych meds: Wellbutrin  XL 150 mg AM and Auvelity  1 BID, fluvoxamine  100 mg in AM and 300 mg HS, lorazepam  1 mg 1-2 mg in the AM and HS and 1 tablet prn midday for anxiety, temazepam  30 mg HS.  Didn't increase Auvelity  yet No SE with med changes. Patient received Spravato  84 mg  today.  She tolerated it well without any unusual headache, nausea or vomiting or other somatic symptoms.  Dissociation did occur and she gradually saw resolution over the 2-hour period of observation.  She does not typically find the dissociation very strong.  It gets rid of negative emotion for awhile after procedure . Pending foot surgery next week.  Able to work in [employer]  this week which helps with dep and anxiety and OCD.  Struggles with sleep without multiple meds as noted.  No adverse effects.  Will return to [employer]  asap after foot surgery  10/04/23 appt noted: Current psych meds: Wellbutrin  XL 150 mg AM and Auvelity  1 BID, fluvoxamine  100 mg in AM and 300 mg HS, lorazepam  1 mg 1-2 mg in the AM and HS and 1 tablet prn midday for anxiety, temazepam  30 mg HS.  Didn't increase Auvelity  yet No SE with med changes. Patient received Spravato  84 mg today.  She tolerated it well without any unusual headache, nausea or vomiting or other somatic symptoms.  Dissociation did occur and she gradually saw resolution over the 2-hour period of observation.  She does not typically find the dissociation very strong.  It gets rid of negative emotion for awhile after procedure . Mood and  anxiety stable.  Benefit with meds and [employer] .   She reports OCD time consumption typically is much better than in the past when it was routinely 2 hours daily.  Now less than have of that.    Chronic caregiver stress with dysfunctional family and has trouble with boundaries.  Working on that in therapy.  10/24/23 appt noted: Current psych meds: Wellbutrin  XL 150 mg AM and Auvelity  1 BID, fluvoxamine  100 mg in AM and 300 mg HS, lorazepam  1 mg 1-2 mg in the AM and HS and 1 tablet prn midday for anxiety, temazepam  30 mg HS.  No SE with med changes. She plans to stop Sprav[place]  bc couldn't get adequate transportation.  Her mood is continuing to be highly reactive with what's going on with family esp mother and brother.  Mother self absorbed and can be mean yet demanding.   She doesn't fear getting worse if she stops Spravato  though this has happened in the past.   Lately less dep though can be pulled down quickly by family of origin interactions.  Narcissistic mother.  Thinking of PT work to try to keep her mind more occupied. Plan no changes  01/15/24 appt noted: Current psych meds: Wellbutrin  XL 150 mg AM and Auvelity  1 BID, fluvoxamine  100 mg in AM and 300 mg HS, lorazepam  1 mg 1-2 mg in the AM and HS and 1 tablet prn midday for anxiety, temazepam  30 mg HS. No SE with med changes. Patient received Spravato  84 mg today.  She tolerated it well without any unusual headache, nausea or vomiting or other somatic symptoms.  Dissociation did occur and she gradually saw resolution over the 2-hour period of observation.  She does not typically find the dissociation very strong.  It gets rid of negative emotion for awhile after procedure . She wanted to resume Spravato  bc 30% benefit for dep and anxiety.  Chronic struggles with OCD and family stress and handles it better when on Spravato . No problems with meds and no desire for changes.  01/16/24 appt noted: Current psych meds: Wellbutrin  XL 150 mg AM and  Auvelity  1 BID, fluvoxamine  100 mg in AM and 300 mg HS, lorazepam  1 mg 1-2 mg in the AM and HS and 1  tablet prn midday for anxiety, temazepam  30 mg HS. No SE with med changes. Patient received Spravato  84 mg today.  She tolerated it well without any unusual headache, nausea or vomiting or other somatic symptoms.  Dissociation did occur and she gradually saw resolution over the 2-hour period of observation.  She does not typically find the dissociation very strong.  It gets rid of negative emotion for awhile after procedure . She wanted to resume Spravato  bc 30% benefit for dep and anxiety.  Chronic struggles with OCD and family stress and handles it better when on Spravato . Asks about dropping out of the Wellbutrin .  01/23/24 appt noted: Current psych meds: Wellbutrin  XL 150 mg AM and Auvelity  1 BID, fluvoxamine  100 mg in AM and 300 mg HS, lorazepam  1 mg 1-2 mg in the AM and HS and 1 tablet prn midday for anxiety, temazepam  30 mg HS. No SE with med changes. Patient received Spravato  84 mg today.  She tolerated it well without any unusual headache, nausea or vomiting or other somatic symptoms.  Dissociation did occur and she gradually saw resolution over the 2-hour period of observation.  She does not typically find the dissociation very strong.  It gets rid of negative emotion for awhile after procedure . She wanted to resume Spravato  bc 30% benefit for dep and anxiety.  Chronic struggles with OCD and family stress and handles it better when on Spravato . She is getting benefit from the Spravato  for depression and anxiety Plan no med changes  02/13/24 appt noted: Current psych meds: Wellbutrin  XL 150 mg AM and Auvelity  1 BID, fluvoxamine  100 mg in AM and 300 mg HS, lorazepam  1 mg 1-2 mg in the AM and HS and 1 tablet prn midday for anxiety, temazepam  30 mg HS. No SE with med changes. Patient received Spravato  84 mg today.  She tolerated it well without any unusual headache, nausea or vomiting or other  somatic symptoms.  Dissociation did occur and she gradually saw resolution over the 2-hour period of observation.  She does not typically find the dissociation very strong.  It gets rid of negative emotion for awhile after procedure . She continues to note considerable improvement in depression and anxiety when she misses provide a treatment on an every 2-week basis which is much as she can arrange given her transportation issues.  She is chronically stressed by her dysfunctional family of origin.  She recognizes she does not handle distressed easily.  She states benefit from the medication and does not want any changes   Previous psych med trials include Prozac, paroxetine, sertraline, fluvoxamine , venlafaxine, Anafranil with no response,  Wellbutrin , Viibryd, Trintellix 10 1 month NR Auvelity  BID   Geodon,  risperidone, Rexulti, Abilify,  Seroquel, Latuda 40 mg with irritability.   lamotrigine lithium,  BuSpar, Namenda,  pramipexole with no response, and Topamax, pindolol  2 brothers & 1 sister poverty, 1 B with untreated bipolar disorder lives with mother  ECT-MADRS    Flowsheet Row Office Visit from 06/29/2021 in [facility]  MADRS Total Score 36      Flowsheet Row Admission (Discharged) from 06/11/2021 in [facility]  C-SSRS RISK CATEGORY No Risk       Review of Systems:  Review of Systems  Constitutional:  Positive for fatigue.  Cardiovascular:  Negative for palpitations.  Musculoskeletal:  Positive for arthralgias, back pain, gait problem, myalgias and neck pain.  Neurological:  Positive for weakness and headaches.  Psychiatric/Behavioral:  Positive for dysphoric mood and sleep disturbance. Negative for suicidal ideas. The patient is nervous/anxious.     Medications: I have reviewed the patient's current medications.  Current Outpatient Medications  Medication Sig Dispense Refill   Abaloparatide (TYMLOS) 3120 MCG/1.56ML SOPN  Inject into the skin.     Azelastine-Fluticasone  137-50 MCG/ACT SUSP Place 1-2 sprays into both nostrils daily.     baclofen  (LIORESAL ) 10 MG tablet Take 20 mg by mouth at bedtime as needed for muscle spasms.     buPROPion  (WELLBUTRIN  XL) 150 MG 24 hr tablet TAKE 1 TABLET (150 MG TOTAL) BY MOUTH IN THE MORNING 90 tablet 0   Dextromethorphan-buPROPion  ER (AUVELITY ) 45-105 MG TBCR Take 1 tablet by mouth in the morning and at bedtime. 60 tablet 1   dicyclomine (BENTYL) 10 MG capsule Take 10 mg by mouth daily.     docusate sodium  (COLACE) 100 MG capsule Take 1 capsule (100 mg total) by mouth 2 (two) times daily. (Patient taking differently: Take 100 mg by mouth daily.) 10 capsule 0   Esketamine HCl, 84 MG Dose, (SPRAVATO , 84 MG DOSE,) 28 MG/DEVICE SOPK USE 3 SPRAYS IN EACH NOSTRIL ONCE A WEEK 3 each 1   fexofenadine (ALLEGRA) 180 MG tablet Take 180 mg by mouth daily.     fluvoxaMINE  (LUVOX ) 100 MG tablet TAKE 1 TABLET IN THE AM AND 3 TABLETS AT NIGHT 120 tablet 2   hydrocortisone  (ANUSOL -HC) 2.5 % rectal cream Place rectally 2 (two) times daily. x 7-14 days 30 g 0   ketotifen  (ZADITOR ) 0.025 % ophthalmic solution Place 3 drops into both eyes at bedtime.     LORazepam  (ATIVAN ) 1 MG tablet TAKE 1-2 TABS EVERY MORNING, 1-2 TABS AT BEDTIME & 1 TAB IN THE AFTERNOON WHEN NEEDED -ANXIETY/SLEEP 150 tablet 1   magnesium gluconate (MAGONATE) 500 MG tablet Take 500 mg by mouth daily.     meloxicam  (MOBIC ) 15 MG tablet Take 1 tablet (15 mg total) by mouth daily. 30 tablet 0   meloxicam  (MOBIC ) 15 MG tablet TAKE 1 TABLET (15 MG TOTAL) BY MOUTH DAILY. 30 tablet 0   methylPREDNISolone  (MEDROL  DOSEPAK) 4 MG TBPK tablet Take as directed 21 each 0   MIBELAS 24 FE 1-20 MG-MCG(24) CHEW Chew 1 tablet by mouth at bedtime as needed (bowel regularity).     Multiple Vitamins-Minerals (ADULT GUMMY PO) Take 2 tablets by mouth in the morning.     nitrofurantoin (MACRODANTIN) 100 MG capsule Take 100 mg by mouth as needed (For  urinary tract infection.).      oxyCODONE -acetaminophen  (PERCOCET/ROXICET) 5-325 MG tablet Take 1-2 tablets by mouth every 6 (six) hours as needed for severe pain. 50 tablet 0   polyethylene glycol (MIRALAX  / GLYCOLAX ) packet Take 17 g by mouth daily as needed for mild constipation. 14 each 0   propranolol  ER (INDERAL  LA) 60 MG 24 hr capsule TAKE 1 CAPSULE BY MOUTH EVERY DAY 30 capsule 2   psyllium (METAMUCIL) 58.6 % powder Take 1 packet by mouth daily as needed (constipation).     SUMAtriptan  (IMITREX ) 100 MG tablet Take 1 tablet (100 mg total) by mouth every 2 (two) hours as needed for migraine. May repeat in 2 hours if headache persists or recurs. 10 tablet 2   temazepam  (RESTORIL ) 30 MG capsule TAKE 1 CAPSULE BY MOUTH AT BEDTIME AS NEEDED FOR SLEEP 30 capsule 0   Vitamin D-Vitamin K (VITAMIN K2-VITAMIN D3 PO) Take 1-2 sprays by mouth daily.     No current facility-administered  medications for this visit.    Medication Side Effects: None   Allergies:  Allergies  Allergen Reactions   Hydrocodone  Itching   Sulfamethoxazole-Trimethoprim Itching   Dust Mite Extract Other (See Comments)    Sneezing, watery eyes, runny nose   Latex Itching   Other Other (See Comments)    PT IS ALLERGIC TO CAT DANDER AND RAGWEED - Sneezing, watery eyes, runny nose    Pollen Extract Other (See Comments)    Sneezing, watery eyes, runny nose     Past Medical History:  Diagnosis Date   Abnormal Pap smear 2011   hpv/mild dysplasia,cin1   Anxiety    Cerebral palsy (HCC)    right arm/leg   Cystocele    Depression    Headache    Neuromuscular disorder (HCC)    Cerebral Palsy   OCD (obsessive compulsive disorder)    Osteoporosis    Uterine prolaps     Family History  Problem Relation Age of Onset   Cancer Father        skin AND LUNG   Alcohol abuse Sister        CRACK COCAINE    Social History   Socioeconomic History   Marital status: Married    Spouse name: Not on file   Number of  children: Not on file   Years of education: Not on file   Highest education level: Not on file  Occupational History   Not on file  Tobacco Use   Smoking status: Never   Smokeless tobacco: Never  Substance and Sexual Activity   Alcohol use: Not Currently    Comment: OCCASIONAL beer   Drug use: No   Sexual activity: Yes    Birth control/protection: Pill    Comment: LOESTRIN 24 FE  Other Topics Concern   Not on file  Social History Narrative   Not on file   Social Drivers of Health   Financial Resource Strain: Not on file  Food Insecurity: Not on file  Transportation Needs: Not on file  Physical Activity: Not on file  Stress: Not on file  Social Connections: Not on file  Intimate Partner Violence: Not on file    Past Medical History, Surgical history, Social history, and Family history were reviewed and updated as appropriate.   Please see review of systems for further details on the patient's review from today.   Objective:   Physical Exam:  LMP  (LMP Unknown)   Physical Exam Constitutional:      General: She is not in acute distress. Neurological:     Mental Status: She is alert and oriented to person, place, and time.     Cranial Nerves: No dysarthria.     Motor: Weakness present.     Gait: Gait abnormal.  Psychiatric:        Attention and Perception: Attention and perception normal.        Mood and Affect: Mood is anxious and depressed.        Speech: Speech normal. Speech is not rapid and pressured or slurred.        Behavior: Behavior normal. Behavior is cooperative.        Thought Content: Thought content normal. Thought content is not delusional. Thought content does not include homicidal or suicidal ideation. Thought content does not include suicidal plan.        Cognition and Memory: Cognition and memory normal. Cognition is not impaired.        Judgment: Judgment normal.  Comments: Insight intact Ongoing OCD remains fairly severe but less  anxious Checking compulsions now about 30 mins Chronic depression persistent but better than it was a year ago better than before Spravato        Lab Review:     Component Value Date/Time   NA 138 06/11/2021 0606   K 4.0 06/11/2021 0606   CL 107 06/11/2021 0606   CO2 26 06/11/2021 0606   GLUCOSE 90 06/11/2021 0606   BUN 18 06/11/2021 0606   CREATININE 0.81 06/11/2021 0606   CALCIUM 9.4 06/11/2021 0606   PROT 6.5 06/11/2021 0606   ALBUMIN  3.3 (L) 06/11/2021 0606   AST 17 06/11/2021 0606   ALT 14 06/11/2021 0606   ALKPHOS 141 (H) 06/11/2021 0606   BILITOT 0.2 (L) 06/11/2021 0606   GFRNONAA >60 06/11/2021 0606   GFRAA >60 07/09/2016 0438       Component Value Date/Time   WBC 5.8 06/11/2021 0606   RBC 4.12 06/11/2021 0606   HGB 12.5 06/11/2021 0606   HCT 39.7 06/11/2021 0606   PLT 299 06/11/2021 0606   MCV 96.4 06/11/2021 0606   MCH 30.3 06/11/2021 0606   MCHC 31.5 06/11/2021 0606   RDW 13.9 06/11/2021 0606   LYMPHSABS 1.9 06/11/2021 0606   MONOABS 0.5 06/11/2021 0606   EOSABS 0.1 06/11/2021 0606   BASOSABS 0.0 06/11/2021 0606    No results found for: "POCLITH", "LITHIUM"   No results found for: "PHENYTOIN", "PHENOBARB", "VALPROATE", "CBMZ"   .res Assessment: Plan:    Lynne "[name]" was seen today for follow-up, depression, anxiety, stress, family problem and sleeping problem.  Diagnoses and all orders for this visit:  Recurrent major depression resistant to treatment (HCC) (r/o MDD+Dysthymia)  Insomnia due to mental condition  Mixed obsessional thoughts and acts  Social anxiety disorder  Migraine without aura and without status migrainosus, not intractable  Relationship problem with family members   Both primary Dx of OCD and major depression are TR and marked.  Impaired function but less so with Spravato  re: depression..   She has been receiving Spravato  84 mg weekly and moderate improvement in the depression..  she feels it also helps OCD somewhat.   However still easily overwhelmed with low stress tolerance.  Family contributes to her anxiety and stress markedly.  The OCD is improved with the increase in fluvoxamine  and with Spravato .  Spends up to 2 hours daily and checking compulsions on her worst days but better when she travels.  No new options for tx are evident.  She has been on higher doses of fluvoxamine  above the usual max of 400 mg daily in the past and finds it more helpful at the higher dose.    Disc SE.   She is tolerating the meds well  Continue  Luvox  back to 400 mg nightly as of January 2023. Disc dosing higher than usual.  She feels this is increase has helped more with OCD which remains chronically severe.  Continue Auvelity  BID and reduced Wellbutrin  to 1 AM. It has helped and is tolerated.   We have discussed seizure risk that is possible using this combination but given severity of her symptoms she feels the risk is warranted.  Do not consider stopping this until she gets the full benefit of Spravato .  Disc dosing again. There are few other alternative medication options that remain.   Disc DDI issues.   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 She doesn't want to try  this now.  We discussed the short-term risks associated with benzodiazepines including sedation and increased fall risk among others.  Discussed long-term side effect risk including dependence, potential withdrawal symptoms, and the potential eventual dose-related risk of dementia.  But recent studies from 2020 dispute this association between benzodiazepines and dementia risk. Newer studies in 2020 do not support an association with dementia. Disc this is high dose and not ideal.  Also disc risk combining it with temazepam . Rec try gradually reduce HS lorazepam  to 1 mg Hs.  Can continue lorazepam  2 mg AM and 1 mg in afternoon bc of chronic anxiety and it is helpful and tolerated. She can continue temazepam  30 mg nightly.  She  tends to have a lot of anxious negative thoughts at night when she is trying to go to bed.  She is trying to reduce the dose.  Complaining of HA and history migraine.  Asks for increase imitrex  and disc preventatives like propranolol  ER imitrex  to 100 mg prn migraine and propranolol  ER for migraine prevention.  Counseling 30 min on how strongly her family's behavior affects her mood.  Looked at some reasons for this and techniques to address her mood and response.  Disc mother and Brothers in particular Disc trasportation problems limiting access to Spravato .  Disc alternative options.   Also working on trying to develop social and spiritual goals.  No med changes  continue Auvelity  1 in AM & PM continue Wellbutrin  XL 150 mg AM Fluvoxamine  100 mg AM and 300 mg PM above usual max bc med necessary Lorazepam  2 mg HS and prn 1 mg BID prn anxiety daily Temazepam  30 mg HS Propranolol  ER 60 mg daily Imitrex  prn migraine  She wants to rcontinue biweekly Spravato  now bc dep worse without it.  FU 2-weeks   Nori Beat, MD, DFAPA  Please see After Visit Summary for patient specific instructions.  Future Appointments  Date Time Provider Department Center  03/07/2024  1:00 PM Maretta Shaper, PhD [facility] None  03/21/2024 11:30 AM [facility]  03/27/2024  1:00 PM Maretta Shaper, PhD [facility] None  04/10/2024  1:00 PM Maretta Shaper, PhD [facility] None  04/25/2024  2:00 PM Maretta Shaper, PhD [facility] None  05/07/2024 11:00 AM Maretta Shaper, PhD [facility] None  05/21/2024 11:00 AM Maretta Shaper, PhD [facility] None  06/04/2024 11:00 AM Maretta Shaper, PhD [facility] None    No orders of the defined types were placed in this encounter.    -------------------------------

## 2024-02-14 ENCOUNTER — Other Ambulatory Visit: Payer: Self-pay | Admitting: Podiatry

## 2024-02-14 NOTE — Telephone Encounter (Signed)
 No further review needed

## 2024-02-19 ENCOUNTER — Ambulatory Visit: Admitting: Psychiatry

## 2024-02-19 DIAGNOSIS — Z0289 Encounter for other administrative examinations: Secondary | ICD-10-CM

## 2024-02-20 ENCOUNTER — Encounter: Payer: Self-pay | Admitting: Psychiatry

## 2024-02-20 NOTE — Telephone Encounter (Signed)
 All requested information faxed.

## 2024-02-22 ENCOUNTER — Ambulatory Visit: Admitting: Psychiatry

## 2024-02-26 NOTE — Telephone Encounter (Signed)
 Prior approval received with medical benefit for Spravato  84 mg effective 02/23/24-08/24/24 (6 months) with Raina Bunting  ZO#[id number]

## 2024-02-27 ENCOUNTER — Ambulatory Visit

## 2024-02-27 ENCOUNTER — Ambulatory Visit (INDEPENDENT_AMBULATORY_CARE_PROVIDER_SITE_OTHER): Admitting: Psychiatry

## 2024-02-27 VITALS — BP 119/78 | HR 72

## 2024-02-27 DIAGNOSIS — G43009 Migraine without aura, not intractable, without status migrainosus: Secondary | ICD-10-CM

## 2024-02-27 DIAGNOSIS — F339 Major depressive disorder, recurrent, unspecified: Secondary | ICD-10-CM

## 2024-02-27 DIAGNOSIS — F422 Mixed obsessional thoughts and acts: Secondary | ICD-10-CM

## 2024-02-27 DIAGNOSIS — F4010 Social phobia, unspecified: Secondary | ICD-10-CM

## 2024-02-27 DIAGNOSIS — F5105 Insomnia due to other mental disorder: Secondary | ICD-10-CM

## 2024-02-27 NOTE — Progress Notes (Addendum)
 NURSE NOTE:   Pt arrived for her #70 Spravato  Treatment, she has been trying to come weekly for now. She started Spravato  treatments on 10/07/2021, she continues with 84 mg (3 of the 28 mg) Spravato  nasal spray for treatment resistance depression. Pt is being treated for Treatment Resistant Depression, Pt taken to treatment room. Pt's medication is now billed through medical as buy and bill as if today's treatment. Medication is stored behind 2 locked doors, it is never given to the pt until time of administration which is observed by the nurse. Disposed of per FDA/REMS regulations. All Spravato  Treatments are documented in Spravato  REMS per protocol of being a treatment center. Spravato  is a CIII medication and has to be only given at a treatment facility and observed by nurse as pt administered intranasally   She was directed to the treatment room to get vitals taken first. Initial vital signs are B/P at 3:12 PM 119/82, 73, SpO2 93%. Pt instructed to blow her nose and to recline back at 45 degrees. Pt given first nasal spray (28 mg) administered by pt observed by nurse. There were 5 minutes between each dose, total of 84 mg. Tolerated well. Assessed pt's 40 minute vital signs at 4:00 PM 120/80, 72, SpO2 98%.  Pt met with Dr. Toi Foster and they discussed her care at the end of her treatment when her thoughts are clearer. She does go to the bathroom at least once during her treatment. Dissociation resolved prior to discharge.  Discharge vitals at 4:58 PM 119/78, 68, SpO2 98%. Pt stable for discharge.  Pt was observed on site a total of 120 minutes per FDA/REMS requirements. Pt was with nurse for clinical assessment 50 minutes. She plans on returning in two weeks.    LOT [id number] EXP JAN [id number]

## 2024-03-04 ENCOUNTER — Other Ambulatory Visit: Payer: Self-pay | Admitting: Psychiatry

## 2024-03-04 DIAGNOSIS — F5105 Insomnia due to other mental disorder: Secondary | ICD-10-CM

## 2024-03-04 NOTE — Telephone Encounter (Signed)
 Pt called and only has 2 pills left

## 2024-03-05 ENCOUNTER — Other Ambulatory Visit

## 2024-03-07 ENCOUNTER — Ambulatory Visit (INDEPENDENT_AMBULATORY_CARE_PROVIDER_SITE_OTHER): Admitting: Psychiatry

## 2024-03-07 DIAGNOSIS — F339 Major depressive disorder, recurrent, unspecified: Secondary | ICD-10-CM | POA: Diagnosis not present

## 2024-03-07 DIAGNOSIS — Z636 Dependent relative needing care at home: Secondary | ICD-10-CM

## 2024-03-07 DIAGNOSIS — Z638 Other specified problems related to primary support group: Secondary | ICD-10-CM | POA: Diagnosis not present

## 2024-03-07 DIAGNOSIS — F4010 Social phobia, unspecified: Secondary | ICD-10-CM

## 2024-03-07 DIAGNOSIS — F422 Mixed obsessional thoughts and acts: Secondary | ICD-10-CM | POA: Diagnosis not present
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
## 2024-03-08 ENCOUNTER — Encounter: Payer: Self-pay | Admitting: Psychiatry

## 2024-03-08 NOTE — Progress Notes (Signed)
 Casey Diaz [id number] July 15, 1966 58 y.o.    Subjective:   Patient ID:  Casey Diaz is a 58 y.o. (DOB 06/27/1966) female.  Chief Complaint:  Chief Complaint  Patient presents with   Follow-up   Depression   Anxiety   Stress   Sleeping Problem     HPI Casey Diaz presents to the office today for follow-up of OCD and severe anxiety.     December 2019 visit the following was noted: No meds were changed. Lives in [name] and back for followup.  Sx are about the same.  Has to take meds with different sizes. Pt reports that mood is Anxious and Depressed and describes anxiety as Severe. Anxiety symptoms include: Excessive Worry, Obsessive Compulsive Symptoms:   Checking,,. Pt reports has interrupted sleep and nocturia. Pt reports that appetite is good. Pt reports that energy is no change and down slightly. Concentration is down slightly. Suicidal thoughts:  denied by patient. Loves the environment of [name] but misses some things there.  She's not able to work there.  H works there and likes it.  Struggled with not working, feels isolated and not up to task of meeting people.  Does attend a church and met a friend who's been helpful.  Leaving for [name] on 10/16/18.   04/09/2020 appointment the following is noted:  Staying another year in [name] bc Covid and other things. Last few months a lot of crying spells.  Is in menopause. Wonders about med changes though is nervous about it.  Crying spells associated with depressing thoughts more than stress or OCD.   Covid really hard on everyone and couldn't see family for 18 mos.  Family still very dysfunctional. No close friends in part due to OCD and depression. Son high Autism spectrum with ADHD and anxiety and she's with him all the time. Greater health problems with CP so more pains.   05/15/20 appt with the following noted: Started Estroven for menopause and helps some. Still depressed.  Chronically. In [place]  for 2 more weeks then  to [name] for another year. A lot of stressors lately triggering more checking and anxiety.   OCD is her CC now and seems.  Got worse DT stress.   Stressed with Asberger's son and her health.  H works a lot.  Her FOO still stress. Plan: Trintellix 10 mg 1 tablet in the morning with food and reduce fluvoxamine  to 5 tablets nightly for 1 week  then reduce it to 4 tablets nightly.   07/02/20 appt with the following noted: Decided not to get Trintellix bc difficulty getting it. It is available.  There.  Wants to start it now.   Both depression and OCD are severe.  Not suicidal in intent or plan. Did not take samples with her to [name] but will be back in December. covid is worse there and travel is difficult.  Wants to reduce Wellbutrin  DT dry mouth. Plan: She's afraid to reduce Luvox  at this time DT fear of worsening OCD.  But will consider. Trintellix 10 mg 1 tablet in the morning with food and reduce fluvoxamine  to 5 tablets nightly for 1 week  then reduce it to 4 tablets nightly. Also reduce Wellbutrin  XL to 300 mg daily.    9-13 2022 appointment with the following noted: [place]  Broke arm a month ago and surgery.  It's all been rough adjustment.   B has cancer on his face and M fell taking him  to the doctor.  Misses the water and weather of [name].   Cry a lot more since menopause. Still depression and anxiety and OCD.  Asks about ketamine. On Wellbutrin  300, Luvox  300.  No Trintellix. Added Ativan  2 mg AM and HS and it helps.  More likely to get upset at night. Plan: Increase Luvox  back to 400 mg daily.  She thinks she's worse on less. Continue Wellbutrin  XL to 300 mg daily. Plan to start Spravato  for TRD asap   09/27/2021 appointment with the following noted:  She has started Spravato  today at 54 mg intranasally.  She tolerated it well without unusual nausea or vomiting headache or other somatic symptoms.  She did have the expected dissociation which gradually  resolved over the course of the 2-hour period of observation.  She was a little concerned about her balance given her cerebral palsy but has not noted unusual or unexpected problems.  She is motivated to can continue Spravato  in hopes of reducing her depressive symptoms. 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  She also has treatment resistant OCD which is partially managed with medications but is still quite disabling.  She is tolerating the medications well.  She is sleeping adequately.  Her appetite is adequate.  She is not having suicidal thoughts.  She continues to wish for a better treatment for OCD that would give her some relief.  09/30/2021 appointment with the following noted: She received her first dose of Spravato  84 mg intranasally today.  She tolerated it well without unusual nausea, vomiting, or other somatic symptoms.  Dissociation as expected did occur and gradually resolved over the 2-hour period of observation.  She did have a mild headache today with the treatment and received ibuprofen 600 mg at her request.  We will follow this to see if it is a pattern Patient is still depressed.  She said she was late with her medicine today and today was a particularly depressing day.  However she notes that the Spravato  has lifted her mood considerably even today.  She is hopeful that it will continue to be helpful.  No suicidal thoughts.  She has ongoing chronic anxiety and OCD at baseline.  10/04/21 appt noted: Patient received Spravato  84 mg for the second time today.  She tolerated it well without any unusual headache, nausea or vomiting or other somatic symptoms.  Dissociation did occur and she gradually [place] resolution over the 2-hour period of observation. She did not have any unusual problems after she left the office last Spravato  administration.  She did not have any specific problems with balance or walking.  She is at increased risk of that  difficulty because of cerebral palsy.  So far she has not noticed much mood effect from the medication beyond the first day of receiving it.  However she would like to continue Spravato  in hopes of getting the antidepressant effect that is desired. Stress dealing with mother's behavior at party pt hosted.  Guilt over it.  10/07/2021 appointment noted: Patient received Spravato  84 mg for the second time today.  She tolerated it well without any unusual headache, nausea or vomiting or other somatic symptoms.  Dissociation did occur and she gradually [place] resolution over the 2-hour period of observation. She still is not sure about the antidepressant effect of Spravato .  Events over the holidays and demands, make it difficult to assess.  She still notes that the OCD tends to worsen the depression and vice versa.  She tolerates the Spravato  well and wants to continue the trial.  10/15/2021 appointment with the following noted: Patient received Spravato  84 mg for the second time today.  She tolerated it well without any unusual headache, nausea or vomiting or other somatic symptoms.  Dissociation did occur and she gradually [place] resolution over the 2-hour period of observation. Patient says it was somewhat difficult to evaluate the effect of the Spravato .  It was scheduled to be twice weekly for 4 weeks consecutively but the holidays have interfered with that administration.  She asked what specifically should be she should be looking for in order to assess improvement.  That was discussed.  The OCD is unchanged and the depression so far is not significantly different.  She still tolerates meds.  There have been no recent med changes  10/19/2021 appt noted: Patient received Spravato  84 mg for the second today.  She tolerated it well without any unusual headache, nausea or vomiting or other somatic symptoms.  Dissociation did occur and she gradually saw resolution over the 2-hour period of observation.    10/21/2021 appointment noted: Patient received Spravato  84 mg today.  She tolerated it well without any unusual headache, nausea or vomiting or other somatic symptoms.  Dissociation did occur and she gradually saw resolution over the 2-hour period of observation.  She feels better than last week.  She is not as depressed and down.  She is still dealing with grief around the death of her cousin that was unexpected.  It is still difficult to tell how much the Spravato  was doing but she is hopeful.  Anxiety is still present with the OCD.  She is not having suicidal thoughts.  She is not hopeless.  She wants to continue treatment.  10/25/2021 appointment with the following noted: Patient received Spravato  84 mg today.  She tolerated it well without any unusual headache, nausea or vomiting or other somatic symptoms.  Dissociation did occur and she gradually saw resolution over the 2-hour period of observation.  She does not typically find the dissociation very strong. She is beginning to think the Spravato  is helping somewhat with the depression.  It has been difficult to tell with the holidays intervening as well as the death of her cousin.  She has not been able to get Spravato  twice weekly for 4 weeks straight as typically planned.  However she is hopeful.  The OCD remains significant.  She still has a tendency to think very negatively.  She is not suicidal.  10/28/2021 appointment with the following noted: Patient received Spravato  84 mg today.  She tolerated it well without any unusual headache, nausea or vomiting or other somatic symptoms.  Dissociation did occur and she gradually saw resolution over the 2-hour period of observation.  She does not typically find the dissociation very strong. She is feeling more hopeful about the administration of Spravato .  She is having less depression she believes.  Still not dramatically different.  She still has a tendency to have a lot of anxiety and rumination and  OCD.  She is not suicidal.  She is eager to continue the Spravato .  11/01/2021 appointment with the following noted: Patient received Spravato  84 mg today.  She tolerated it well without any unusual headache, nausea or vomiting or other somatic symptoms.  Dissociation did occur and she gradually saw resolution over the 2-hour period of observation.  She does not typically find the dissociation very strong. She is continuing to see a little bit of  improvement in depression with Spravato .  The anxiety remains but may be not as severe.  The OCD remains markedly severe chronically.  She is not suicidal.  She is encouraged by the degree of improvement with Spravato  and inability to enjoy things more and not be quite as ruminative.  11/04/2021 appt noted: Patient received Spravato  84 mg today.  She tolerated it well without any unusual headache, nausea or vomiting or other somatic symptoms.  Dissociation did occur and she gradually saw resolution over the 2-hour period of observation.  She does not typically find the dissociation very strong. No SE complaints with meds. She continues to feel hopeful about the Spravato .  She has less depression.  Because of a number of factors she is uncertain of the full benefit but thinks she is somewhat less depressed.  Her anxiety and OCD remain significant but a little better.  She is tolerating the medications and does not desire medicine change.  She is not currently complaining of insomnia.   11/08/2021 appointment the following noted: Patient received Spravato  84 mg today.  She tolerated it well without any unusual headache, nausea or vomiting or other somatic symptoms.  Dissociation did occur and she gradually saw resolution over the 2-hour period of observation.  She does not typically find the dissociation very strong. No SE complaints with meds. She feels the Spravato  is helping somewhat.  She would like to see a greater effect.  However she is able to enjoy things.   She is productive at home.  She would like to see a lifting of a degree of sadness that remains.  The anxiety and OCD remained largely unchanged.  She wondered about the dosing of Wellbutrin  300 mg a day and Luvox  300 mg a day and possible increases.  She has been at higher doses in the past.  She plans to start water therapy for her weakness and for her shoulder.  11/11/2021 appointment with the following noted: Patient received Spravato  84 mg today.  She tolerated it well without any unusual headache, nausea or vomiting or other somatic symptoms.  Dissociation did occur and she gradually saw resolution over the 2-hour period of observation.  She does not typically find the dissociation very strong. No SE complaints with meds. She feels the Spravato  is clearly helping the depression.  She would like to see a more significant effect.  She is still having trouble thinking positive. Her energy is fair.  Concentration is good except for the problem with chronic obsessions. She has been taking Wellbutrin  300 mg in Luvox  300 mg for quite some time but has taken higher doses in the past.  We discussed that.  She would like to try higher doses in order to get a better effect if possible. We just increased the doses a couple of days ago.  No effect yet.  11/15/2021 appointment with the following noted: Patient received Spravato  84 mg today.  She tolerated it well without any unusual headache, nausea or vomiting or other somatic symptoms.  Dissociation did occur and she gradually saw resolution over the 2-hour period of observation.  She does not typically find the dissociation very strong. No SE complaints with meds. The patient is now convinced that the Spravato  is helping the depression.  She would like to continue twice weekly Spravato  this week if possible.  She has tolerated the increase in Wellbutrin  to 450 mg daily and the increase and fluvoxamine  to 400 mg daily without complications thus far.  The OCD  and anxiety feed the depression to some extent. She spends approximately 2 hours daily with checking compulsions due to obsessions about causing harm to others.  For example fearing that when she has hit a pot hole that she may have hit a person and going back to check.  Checking corners and rooms out of fear that she may have harmed someone.  Other various checking compulsions.  She is hoping the increase in fluvoxamine  to 400 mg will reduce that over the weeks to come.  She is not seeing a significant difference with the addition of the Spravato  though she understands that was not expected.  She is more productive at home and more motivated and able to enjoy things more fully as a result of the Spravato  treatment.  She is tolerating the medication  11/18/2021 appointment with the following noted: Patient received Spravato  84 mg today.  She tolerated it well without any unusual headache, nausea or vomiting or other somatic symptoms.  Dissociation did occur and she gradually saw resolution over the 2-hour period of observation.  She does not typically find the dissociation very strong. No SE complaints with meds. She clearly believes the Spravato  has been helpful for the depression.  She wonders whether to continue to treatments weekly or to cut back to 1 weekly.  She would like to continue twice weekly in hopes of getting additional improvement in the depression because it is not resolved but it is difficult to get here twice a week in terms of arranging rides. She is recently increased Wellbutrin  XL to 450 mg daily and fluvoxamine  to 400 mg daily but they have not had time to have an official effect.  She is tolerating that well.  She is tolerating meds overwork overall well. The OCD remains the same as noted on 11/15/2021  11/25/21 appt noted: Patient received Spravato  84 mg today.  She tolerated it well without any unusual headache, nausea or vomiting or other somatic symptoms.  Dissociation did occur and  she gradually saw resolution over the 2-hour period of observation.  She does not typically find the dissociation very strong. No SE complaints with meds. She thinks the increase in Wellbutrin  and Luvox  have been potentially helpful for depression and OCD respectively.  It has been too early to see the full effect.  She is sleeping and eating well.  She is functioning at home.  She still spends a lot of time that is about 2 hours a day dealing with compulsive behaviors.  12/02/21 appt noted: Patient received Spravato  84 mg today.  She tolerated it well without any unusual headache, nausea or vomiting or other somatic symptoms.  Dissociation did occur and she gradually saw resolution over the 2-hour period of observation.  She does not typically find the dissociation very strong. No SE complaints with meds. Several losses and stressors recently that affect her sense of mood. However still sees significant benefit from the Spravato  for her depression.  Wants to continue it. Suspect early  some benefit from the increased Wellbutrin  for depression and Luvox  for OCD. Tolerating meds. No complaints about the meds. Sleeping and eating well.  No new health concerns.  12/09/21 appt noted: Patient received Spravato  84 mg today.  She tolerated it well without any unusual headache, nausea or vomiting or other somatic symptoms.  Dissociation did occur and she gradually saw resolution over the 2-hour period of observation.  She does not typically find the dissociation very strong. No SE complaints with meds. Seeing noticeable  improvement from increase fluvoxamine  to 400 mg daily.  Tolerating meds without concerns over them. Depression is stable with residual sx of easy guilt and easily stressed.  OCD contributes to depression but depression is not severe with less crying spells.  Productive at home with chores.  Enjoyed recent birthday.  Sleeping good. No new concerns.  12/23/2021 appointment noted: Patient  received Spravato  84 mg today.  She tolerated it well without any unusual headache, nausea or vomiting or other somatic symptoms.  Dissociation did occur and she gradually saw resolution over the 2-hour period of observation.  She does not typically find the dissociation very strong. No SE complaints with meds. Seeing noticeable improvement from increase fluvoxamine  to 400 mg daily.  Tolerating meds without concerns over them. Her depression is somewhat improved with the Spravato .  She also feels generally a little lighter.  She is more motivated.  She is less overwhelmed by guilt.  The OCD is gradually improving but is still quite time-consuming as noted before.  She is sleeping well.  No side effects  12/30/2021 appointment with the following noted: Patient received Spravato  84 mg today.  She tolerated it well without any unusual headache, nausea or vomiting or other somatic symptoms.  Dissociation did occur and she gradually saw resolution over the 2-hour period of observation.  She does not typically find the dissociation very strong. No SE complaints with meds. Seeing noticeable improvement from increase fluvoxamine  to 400 mg daily.  Tolerating meds without concerns over them. She is confident of her the improvement seen with Spravato .  She is less hopeless.  Guilt is marked remarkably improved.  She is not having any thoughts of death or dying.  She is more motivated for activities such as exercise which she is recently started.  She is sleeping well. The OCD remains severe but it is improving somewhat with the increase in fluvoxamine .  It is still consuming a couple hours per day.  01/10/22 apravato 84 admin  01/24/22 appt noted: Patient received Spravato  84 mg today.  She tolerated it well without any unusual headache, nausea or vomiting or other somatic symptoms.  Dissociation did occur and she gradually saw resolution over the 2-hour period of observation.  She does not typically find the  dissociation very strong. No SE complaints with meds. Very tearful today.  Feels like she has been suppressing emotion in the Spravato  caused it to be released.  Discussed some stressors.  Overall still feels the medicine is helpful.  She has missed some of the scheduled Spravato  treatments that were intended to be weekly due to circumstances beyond her control.  She is still struggling with OCD as previously noted but does believe the medications are helpful. Plan no med changes  01/31/2022 received Spravato  84 mg today  02/09/2022 appointment with the following noted: Patient received Spravato  84 mg today.  She tolerated it well without any unusual headache, nausea or vomiting or other somatic symptoms.  Dissociation did occur and she gradually saw resolution over the 2-hour period of observation.  She does not typically find the dissociation very strong. No SE complaints with meds. Spravato  clearly helps depression and OCD but easily gets overwhelmed and tearful with fairly routine stressors.  Tolerating meds. Sleep and appetite is OK Asks to increase lorazepam  to 2 mg AM and HS and 1mg  afternoon  02/16/22 appt noted: Patient received Spravato  84 mg today.  She tolerated it well without any unusual headache, nausea or vomiting or other somatic symptoms.  Dissociation did occur and she gradually saw resolution over the 2-hour period of observation.  She does not typically find the dissociation very strong. No SE complaints with meds. She has chronic depesssion and OCD but is improved with Spravato , both dx versus before.  She has continued Luvox  400 mg and Wellbutrin  450 mg and is tolerating it.  Chronically easily stressed.  Tolerating all meds.  Doesn't like taking more meds.  Spending a couple hours daily with OCD.  No SI No med changes.  02/21/22 appt noted:   Doesn't like taking more meds.  Spending a couple hours daily with OCD.  No SI No med changes.  02/21/22 appt noted: Patient received  Spravato  84 mg today.  She tolerated it well without any unusual headache, nausea or vomiting or other somatic symptoms.  Dissociation did occur and she gradually saw resolution over the 2-hour period of observation.  She does not typically find the dissociation very strong. No SE complaints with meds. She has chronic depesssion and OCD but is improved with Spravato , both dx versus before.  She has continued Luvox  400 mg and Wellbutrin  450 mg and is tolerating it.  Chronically easily overwhelmed and doesn't know why.  Tolerating all meds. Wants to continue meds.  03/16/22 appt noted: Patient received Spravato  84 mg today.  She tolerated it well without any unusual headache, nausea or vomiting or other somatic symptoms.  Dissociation did occur and she gradually saw resolution over the 2-hour period of observation.  She does not typically find the dissociation very strong. No SE complaints with meds. Overall she still feels the Spravato  has been helpful not only for her depression but also for her OCD which was somewhat unexpected.  OCD is still significant but it is less severe than prior to starting Spravato .  She is tolerating Luvox  400 mg and Wellbutrin  450 mg.  We discussed possible med adjustments.  03/23/22 appt noted: Patient received Spravato  84 mg today.  She tolerated it well without any unusual headache, nausea or vomiting or other somatic symptoms.  Dissociation did occur and she gradually saw resolution over the 2-hour period of observation.  She does not typically find the dissociation very strong. No SE complaints with meds. She is still depressed and still has OCD of course but is improved with the Spravato .  She is tolerating the medications well.  We had previously discussed the possibility of switching some of the Wellbutrin  to Auvelity  and she is very interested in that in hopes of further improvement in depression and OCD.  She understands that Auvelity  is not used for OCD on the label.   She is tolerating the medications.  She is still easily overwhelmed.  She is sleeping and eating okay.. Plan: Reduce Wellbutrin  XL to 300 mg AM and add Auvelity  1 tablet each AM  03/30/22 appt noted: Patient received Spravato  84 mg today.  She tolerated it well without any unusual headache, nausea or vomiting or other somatic symptoms.  Dissociation did occur and she gradually saw resolution over the 2-hour period of observation.  She does not typically find the dissociation very strong. No SE complaints with meds. She is still depressed and still has OCD of course but is improved with the Spravato .  She is tolerating the medications well.  No difference with Auvelity  1 AM so far and no SE.  Going on vacation on Saturday. 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 Sleep and appetite good. Plan: Increase Auvelity  to 1 twice daily and  reduce Wellbutrin  to XL 150 every morning  04/14/2022 appointment with the following noted: Patient received Spravato  84 mg today.  She tolerated it well without any unusual headache, nausea or vomiting or other somatic symptoms.  Dissociation did occur and she gradually saw resolution over the 2-hour period of observation.  She does not typically find the dissociation very strong. No SE complaints with meds. She is still depressed and still has OCD of course but is improved with the Spravato .  She is tolerating the medications well.  Just increased Auvelity  to BID yesterday and reduced Wellbutrin  to 150 AM. No SE so far.  No change in mood or anxiety so far.  Chronic OCD as noted and residual depression and chronic fatigue.  04/21/2022 appointment with the following noted: Patient received Spravato  84 mg today.  She tolerated it well without any unusual headache, nausea or vomiting or other somatic symptoms.  Dissociation did occur and she gradually saw resolution over the 2-hour period of observation.  She does not typically find the dissociation very strong. No  SE complaints with meds. She is still depressed and still has OCD of course but is improved with the Spravato .   She has questions about the dosing of lorazepam . She tends to have negative anxious thoughts at night.  This tends to interfere with her ability to go to sleep.  She is getting about 8 to 9 hours of sleep.  She is tolerating the meds without excessive sedation and does not nap during the day.  05/06/22 appt noted: Patient received Spravato  84 mg today.  She tolerated it well without any unusual headache, nausea or vomiting or other somatic symptoms.  Dissociation did occur and she gradually saw resolution over the 2-hour period of observation.  She does not typically find the dissociation very strong. No SE complaints with meds. She is still depressed and still has OCD of course but is improved with the Spravato .   Had some questions about timing of dosing of fluvoxamine  and Auvelity . OCD is not quite as time consuming.  Sleep and eating are the same.   No SE meds.  05/25/22 appt noted: Patient received Spravato  84 mg today.  She tolerated it well without any unusual headache, nausea or vomiting or other somatic symptoms.  Dissociation did occur and she gradually saw resolution over the 2-hour period of observation.  She does not typically find the dissociation very strong. No SE complaints with meds. She is still depressed and still has OCD of course but is improved with the Spravato .   She has less OCD when away from home and on vacation of note. Plan: Rec gradually reduce HS lorazepam  to 1 mg Hs.  Can continue lorazepam  2 mg AM and 1 mg in afternoon bc of chronic anxiety and it is helpful and tolerated. She can continue temazepam  30 mg nightly.  She tends to have a lot of anxious negative thoughts at night when she is trying to go to bed  06/16/22 appt noted: Patient received Spravato  84 mg today.  She tolerated it well without any unusual headache, nausea or vomiting or other somatic  symptoms.  Dissociation did occur and she gradually saw resolution over the 2-hour period of observation.  She does not typically find the dissociation very strong. No SE complaints with meds. She is still depressed and still has OCD of course but is improved with the Spravato .   She has less OCD when away from home and on vacation of note. She  is tolerating the medications.  She has continued current medications. 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  She still receives benefit from Spravato .  Her depression and OCD are better with the Spravato .  She thinks she is getting additional benefit with the switch from Wellbutrin  to Auvelity .  07/04/2022 appointment noted: Reports she developed a rash on her face from Auvelity  and feels like she is allergic to it.  She stopped it and went back to Wellbutrin  450 mg every morning.  The rash has cleared up.  She did not require any medical attention and did not have shortness of breath. Overall her depression and OCD are about the same as they have been.  She did not notice a substantial difference from the brief treatment with Auvelity  but she understands she did not take a full course.  She is tolerating the current medicines well. Current meds fluvoxamine  400 mg daily, Wellbutrin  XL 450 mg daily, lorazepam  1 to 2 mg in the morning and 1 to 2 mg at night and 1 mg in the afternoon, temazepam  30 mg nightly. She wants to continue the Spravato  because she feels it has been helpful for both her depression and her racing OCD  07/18/22 appt noted: Patient received Spravato  84 mg today.  She tolerated it well without any unusual headache, nausea or vomiting or other somatic  symptoms.  Dissociation did occur and she gradually saw resolution over the 2-hour period of observation.  She does not typically find the dissociation very strong. No SE complaints with meds. She is still depressed and still has OCD of course but is improved with the Spravato .  Rash better off Auvelity  and back on Welllbutrin XL 450 mg AM, fluvoxamine  400 mg daily.  08/15/22 appt noted: Current psych meds: Wellbutrin  XL 450 mg AM, fluvoxamine  100 mg in AM and 300 mg HS, lorazepam  1 mg 1-2 mg in the AM and HS and 1 tablet prn midday for anxiety, temazepam  30 mg HS Patient received Spravato  84 mg today.  She tolerated it well without any unusual headache, nausea or vomiting or other somatic symptoms.  Dissociation did occur and she gradually saw resolution over the 2-hour period of observation.  She does not typically find the dissociation very strong. No SE complaints with meds. She has a great deal of stress dealing with her family.  Disc brother's ongoing mania and difficulty getting him help and the stress he causes for the family. She wants to continue Spravato  through this very stressful holdicay season and reevaluate the frequency after the New Year.  09/12/22 appt noted: Current psych meds: Wellbutrin  XL 450 mg AM, fluvoxamine  100 mg in AM and 300 mg HS, lorazepam  1 mg 1-2 mg in the AM and HS and 1 tablet prn midday for anxiety, temazepam  30 mg HS Patient received Spravato  84 mg today.  She tolerated it well without any unusual headache, nausea or vomiting or other somatic symptoms.  Dissociation did occur and she gradually saw resolution over the 2-hour period of observation.  She does not typically find the dissociation very strong. No SE complaints with meds. She has a great deal of stress dealing with her family.  Disc brother's ongoing mania and difficulty getting him help and the stress he causes for the family. She wants to continue Spravato  through this very stressful holdicay season  and reevaluate the frequency after the New Year.  Chronically easily overwhelmed with family. Complaining of HA and history migraine.  Asks for increase imitrex  and disc preventatives like propranolol  ER  09/26/22 appt noted: Current psych meds: Wellbutrin  XL 450 mg AM, fluvoxamine  100 mg in AM and 300 mg HS, lorazepam  1 mg 1-2 mg in the AM and HS and 1 tablet prn midday for anxiety, temazepam  30 mg HS Patient received Spravato  84 mg today.  She tolerated it well without any unusual headache, nausea or vomiting or other somatic symptoms.  Dissociation did occur and she gradually saw resolution over the 2-hour period of observation.  She does not typically find the dissociation very strong. No SE complaints with meds. She has a great deal of stress dealing with her family.  This is ongoing The holidays are much more stressful DT family problems.  She is noting OCD is much worse over the last couple of week.  Depression is better with Spravato . Needed higher dose meds for migraine.   10/03/22 appt noted: Current psych meds: Wellbutrin  XL 450 mg AM, fluvoxamine  100 mg in AM and 300 mg HS, lorazepam  1 mg 1-2 mg in the AM and HS and 1 tablet prn midday for anxiety, temazepam  30 mg HS Patient received Spravato  84 mg today.  She tolerated it well without any unusual headache, nausea or vomiting or other somatic symptoms.  Dissociation did occur and she gradually saw resolution over the 2-hour period of observation.  She does not typically find the dissociation very strong. No SE complaints with meds. She has now realized that the rash she previously previously attributed to Auvelity  was not related.  She is interested may be retrying that after the holidays.  She is tolerating medications otherwise. The holidays remain chronically stressful to her due to family dynamic problems which cause her to consistently feel stuck.  Under more stress her OCD is worse.  She will have a tendency to have crying spells.   The depression and OCD are still improved with Spravato  as compared to before.  11/15/22 appt noted: Current psych meds: Wellbutrin  XL 450 mg AM, fluvoxamine  100 mg in AM and 300 mg HS, lorazepam  1 mg 1-2 mg in the AM and HS and 1 tablet prn midday for anxiety, temazepam  30 mg HS Patient received Spravato  84 mg today.  She tolerated it well without any unusual headache, nausea or vomiting or other somatic symptoms.  Dissociation did occur and she gradually saw resolution over the 2-hour period of observation.  She does not typically find the dissociation very strong. No SE complaints with meds. Continues to feel depressed and overwhelmed by family problems including her brother's mania and recent eviction and commitment.  Chronic OCD worse when stressed.  No SI.  Tolerating meds. Plan: Per her request continue Wellbutrin  XL 450 mg every morning. She has come to the realization that the rash she had previously attributed to Auvelity  was not related.  She is interested in perhaps retrying Auvelity . There are few alternative medication options that remain.  01/11/23 appt noted: Current psych meds: Wellbutrin  XL 150 mg BID and started Auvelity  1 AM, fluvoxamine  100 mg in AM and 300 mg HS, lorazepam  1 mg 1-2 mg in the AM and HS and 1 tablet prn midday for anxiety, temazepam  30 mg HS  Patient received Spravato  84 mg today.  She tolerated it well without any unusual headache, nausea or vomiting or other somatic symptoms.  Dissociation did occur and she gradually saw resolution over the 2-hour period of observation.  She does not typically find the dissociation very strong. No SE complaints with meds. Trouble getting to sessions lately DT transportation problems.   Continues to feel depressed and overwhelmed by OCD and family.  Feels she needs toevery other week Spravato  bc it helps for a couple of weeks and then seems to wear off.  Struggleing with OCD and depression both of which are eased by Spravato . Less  crying with Auvelity .  02/07/23 appt noted: Current psych meds: Wellbutrin  XL 150 mg BID and started Auvelity  1 AM, fluvoxamine  100 mg in AM and 300 mg HS, lorazepam  1 mg 1-2 mg in the AM and HS and 1 tablet prn midday for anxiety, temazepam  30 mg HS Patient received Spravato  84 mg today.  She tolerated it well without any unusual headache, nausea or vomiting or other somatic symptoms.  Dissociation did occur and she gradually saw resolution over the 2-hour period of observation.  She does not typically find the dissociation very strong. No SE complaints with meds. Trouble getting to sessions lately DT transportation problems.  This is a problem ongoing and thinks she might need to pause Spravato  bc won't be able to get her for at least 3 weeks. She is holding pretty steady with a moderate level of anxiety and depression ongoing and chronic.    03/20/23 appt noted: Current psych meds: Wellbutrin  XL 150 mg BID and started Auvelity  1 AM, fluvoxamine  100 mg in AM and 300 mg HS, lorazepam  1 mg 1-2 mg in the AM and HS and 1 tablet prn midday for anxiety, temazepam  30 mg HS Patient received Spravato  84 mg today.  She tolerated it well without any unusual headache, nausea or vomiting or other somatic symptoms.  Dissociation did occur and she gradually saw resolution over the 2-hour period of observation.  She does not typically find the dissociation very strong. No SE complaints with meds. She is trying to get back into more regular Spravato  administration.  Family issues and transportation problems that led to her missing Spravato .  She feels more depressed without regular Spravato .  Her OCD is chronic but also worse when she misses Spravato .  She does not want any medication changes.  04/03/23 appt noted: Current psych meds: Wellbutrin  XL 150 mg BID and started Auvelity  1 AM, fluvoxamine  100 mg in AM and 300 mg HS, lorazepam  1 mg 1-2 mg in the AM and HS and 1 tablet prn midday for anxiety, temazepam  30 mg  HS Patient received Spravato  84 mg today.  She tolerated it well without any unusual headache, nausea or vomiting or other somatic symptoms.  Dissociation did occur and she gradually saw resolution over the 2-hour period of observation.  She does not typically find the dissociation very strong.  It gets rid of negative emotion for awhile after procedure and would like it to last longer.   No SE complaints with meds.  Doesn't really want med changes.   Planning to weekly Spravato .  It is helping dep and anxiety.    04/10/23 appt noted: Current psych meds: Wellbutrin  XL 150 mg BID and started Auvelity  1 AM, fluvoxamine  100 mg in AM and 300 mg HS, lorazepam  1 mg 1-2 mg in the AM and HS and 1 tablet prn midday for anxiety, temazepam  30 mg  HS Patient received Spravato  84 mg today.  She tolerated it well without any unusual headache, nausea or vomiting or other somatic symptoms.  Dissociation did occur and she gradually saw resolution over the 2-hour period of observation.  She does not typically find the dissociation very strong.  It gets rid of negative emotion for awhile after procedure and would like it to last longer.   No SE complaints with meds.  Doesn't really want med changes.   Had lidocaine  shot for back pain with brief benefit.  Doing some water based PT Spravato  went well today. Got pretty upset last week with event related to son's activities.  Got upset with son saying something inappropriate publicly.  Was so embarrassed.  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    He's kind of rebellious lately.  Son is 51 yo.    05/03/23 appt noted: Current psych meds: Wellbutrin  XL 150 mg BID and started Auvelity  1 AM, fluvoxamine  100 mg in AM and 300 mg HS, lorazepam  1 mg 1-2 mg in the AM and HS and 1 tablet prn midday for anxiety, temazepam  30 mg HS No SE Patient received Spravato  84 mg today.  She tolerated it well without any unusual headache, nausea or  vomiting or other somatic symptoms.  Dissociation did occur and she gradually saw resolution over the 2-hour period of observation.  She does not typically find the dissociation very strong.  It gets rid of negative emotion for awhile after procedure and would like it to last longer.   Still pleased with benefit from Spravato  for mood and anxiety. No SE complaints with meds.  Doesn't really want med changes.   Chronic family px ongoing affects her but nothing she can change.H sick of the family drama.   Continuing counseling helps.  Has some support. Mood and anxiety pretty steady but did go on vacation to [place].  Anxiety worse first in AM and then later at night with mind racing on stressors.   Still back trouble.  05/23/23 appt noted: Current psych meds: Wellbutrin  XL 150 mg BID and started Auvelity  1 AM, fluvoxamine  100 mg in AM and 300 mg HS, lorazepam  1 mg 1-2 mg in the AM and HS and 1 tablet prn midday for anxiety, temazepam  30 mg HS No SE Patient received Spravato  84 mg today.  She tolerated it well without any unusual headache, nausea or vomiting or other somatic symptoms.  Dissociation did occur and she gradually saw resolution over the 2-hour period of observation.  She does not typically find the dissociation very strong.  It gets rid of negative emotion for awhile after procedure and would like it to last longer.   Still pleased with benefit from Spravato  for mood and anxiety by 50%. No SE complaints with meds. Chronic family stress interferes with mental health and self care.  May have to reduce frequency of Spravato  bc of this. But is status quo with meds.  Chronic residual OCD which is mod severe and dep moderate.  05/30/23 appt noted: Current psych meds: Wellbutrin  XL 150 mg BID and started Auvelity  1 AM, fluvoxamine  100 mg in AM and 300 mg HS, lorazepam  1 mg 1-2 mg in the AM and HS and 1 tablet prn midday for anxiety, temazepam  30 mg HS No SE Patient received Spravato  84 mg  today.  She tolerated it well without any unusual headache, nausea or vomiting or other somatic symptoms.  Dissociation did occur and she gradually saw resolution over the 2-hour period of observation.  She does not typically find the dissociation very strong.  It gets rid of negative emotion for awhile after procedure and would like it to last longer.   Still pleased with benefit from Spravato  for mood and anxiety by 50% or better but not resolved. She wants to continue Spravato  as frequently as schedule will allow. Both depression and OCD are better with most improvement in mood.  Asks about any new treatments for OCD. No SE complaints with meds. Chronic family stress interferes with mental health and self care.  This is an ongoing drain on her mood and resources emotionally.    06/06/23 appt noted: Current psych meds: Wellbutrin  XL 150 mg BID and started Auvelity  1 AM, fluvoxamine  100 mg in AM and 300 mg HS, lorazepam  1 mg 1-2 mg in the AM and HS and 1 tablet prn midday for anxiety, temazepam  30 mg HS No SE Patient received Spravato  84 mg today.  She tolerated it well without any unusual headache, nausea or vomiting or other somatic symptoms.  Dissociation did occur and she gradually saw resolution over the 2-hour period of observation.  She does not typically find the dissociation very strong.  It gets rid of negative emotion for awhile after procedure and would like it to last longer.   Still believes each meds work and are helpful and well tolerated.  Specifically she thinks that the Auvelity  adds additional benefit on taking Wellbutrin .  She believes the meds help both depression and OCD though the OCD remains a problem.  She also believes Spravato  helps both types of symptoms as well.  Her mood is variable.  She experiences a great deal of stress from her family and those problems wax and wane.  She has bad days because of it and today is 1 of those days.  She is continuing counseling and that is  helpful.  Due to family demands she may have to spread out the Spravato  frequency.  06/16/23 appt noted:  Current psych meds: Wellbutrin  XL 150 mg BID and started Auvelity  1 AM, fluvoxamine  100 mg in AM and 300 mg HS, lorazepam  1 mg 1-2 mg in the AM and HS and 1 tablet prn midday for anxiety, temazepam  30 mg HS No SE Patient received Spravato  84 mg today.  She tolerated it well without any unusual headache, nausea or vomiting or other somatic symptoms.  Dissociation did occur and she gradually saw resolution over the 2-hour period of observation.  She does not typically find the dissociation very strong.  It gets rid of negative emotion for awhile after procedure . She is trying to make the Schedule work for Spravto oh every week or every other week because she is aware the treatment effects can be lost if it is time less frequently.  So she has been able to come the last 2 weeks, we will try to come in 2 weeks.  Spravato  reduces depression and OCD significantly though she remains highly symptomatic with both.  However she has no suicidal thoughts.  Crying spells are reduced with Spravato .  She still spends a fair amount of time meaning over an hour a day checking and more under stress or when tired.  She asks about any new meds for OCD. Plan: increase Auvelity  1 in AM & PM Hold Wellbutrin  XL 150 mg BID  07/12/23 appt noted: Current psych meds: Wellbutrin  XL 150 mg BID and started Auvelity   1 AM, fluvoxamine  100 mg in AM and 300 mg HS, lorazepam  1 mg 1-2 mg in the AM and HS and 1 tablet prn midday for anxiety, temazepam  30 mg HS.  Didn't increase Auvelity  yet No SE Patient received Spravato  84 mg today.  She tolerated it well without any unusual headache, nausea or vomiting or other somatic symptoms.  Dissociation did occur and she gradually saw resolution over the 2-hour period of observation.  She does not typically find the dissociation very strong.  It gets rid of negative emotion for awhile after  procedure . She has not increased the Auvelity  yet is planned.  She has a little bit of nervousness about it but admits is not rational.  She is willing to give that a try to try to get better control of depression and anxiety.  She continues to see the benefit of Spravato  every other week.  She is struggling with some pain due to plantar fasciitis and given her cerebral palsy that makes it even more difficult to walk and to engage in normal activities.  She is willing to seek treatment for that.  07/25/23 appt noted: Current psych meds: Wellbutrin  XL 150 mg AM and started Auvelity  1 BID, fluvoxamine  100 mg in AM and 300 mg HS, lorazepam  1 mg 1-2 mg in the AM and HS and 1 tablet prn midday for anxiety, temazepam  30 mg HS.  Didn't increase Auvelity  yet No SE Patient received Spravato  84 mg today.  She tolerated it well without any unusual headache, nausea or vomiting or other somatic symptoms.  Dissociation did occur and she gradually saw resolution over the 2-hour period of observation.  She does not typically find the dissociation very strong.  It gets rid of negative emotion for awhile after procedure . Chronic dep and anxiety to some extent.  But has increased Auvelity  to BID recently and no SE yet.  Disc frequency Spravato  bc benefit and will try to continue every other week and not less if possible. No further concerns for meds.  08/08/23 appt noted: Current psych meds: Wellbutrin  XL 150 mg AM and Auvelity  1 BID, fluvoxamine  100 mg in AM and 300 mg HS, lorazepam  1 mg 1-2 mg in the AM and HS and 1 tablet prn midday for anxiety, temazepam  30 mg HS.  Didn't increase Auvelity  yet No SE with med changes. Patient received Spravato  84 mg today.  She tolerated it well without any unusual headache, nausea or vomiting or other somatic symptoms.  Dissociation did occur and she gradually saw resolution over the 2-hour period of observation.  She does not typically find the dissociation very strong.  It gets rid  of negative emotion for awhile after procedure . Mornings still worse with dep and anxiety.  But mood and anxiety are better with the increase in Auvelity  to BID.  OCD seems a little better.  Chronic difficulty handling the complaints and stress from her family.  Easily stresser her out.  No med changes desire. She will have to take a break from Spravato  DT pending foot surgery.  Mobility and transportation will be limited.  08/25/23 appt noted: Current psych meds: Wellbutrin  XL 150 mg AM and Auvelity  1 BID, fluvoxamine  100 mg in AM and 300 mg HS, lorazepam  1 mg 1-2 mg in the AM and HS and 1 tablet prn midday for anxiety, temazepam  30 mg HS.  Didn't increase Auvelity  yet No SE with med changes. Patient received Spravato  84 mg today.  She tolerated it well  without any unusual headache, nausea or vomiting or other somatic symptoms.  Dissociation did occur and she gradually saw resolution over the 2-hour period of observation.  She does not typically find the dissociation very strong.  It gets rid of negative emotion for awhile after procedure . Pending foot surgery next week.  Able to work in [employer]  this week which helps with dep and anxiety and OCD.  Struggles with sleep without multiple meds as noted.  No adverse effects.  Will return to Spravato  asap after foot surgery  10/04/23 appt noted: Current psych meds: Wellbutrin  XL 150 mg AM and Auvelity  1 BID, fluvoxamine  100 mg in AM and 300 mg HS, lorazepam  1 mg 1-2 mg in the AM and HS and 1 tablet prn midday for anxiety, temazepam  30 mg HS.  Didn't increase Auvelity  yet No SE with med changes. Patient received Spravato  84 mg today.  She tolerated it well without any unusual headache, nausea or vomiting or other somatic symptoms.  Dissociation did occur and she gradually saw resolution over the 2-hour period of observation.  She does not typically find the dissociation very strong.  It gets rid of negative emotion for awhile after procedure . Mood and  anxiety stable.  Benefit with meds and Spravato .   She reports OCD time consumption typically is much better than in the past when it was routinely 2 hours daily.  Now less than have of that.    Chronic caregiver stress with dysfunctional family and has trouble with boundaries.  Working on that in therapy.  10/24/23 appt noted: Current psych meds: Wellbutrin  XL 150 mg AM and Auvelity  1 BID, fluvoxamine  100 mg in AM and 300 mg HS, lorazepam  1 mg 1-2 mg in the AM and HS and 1 tablet prn midday for anxiety, temazepam  30 mg HS.  No SE with med changes. She plans to stop Spravato  bc couldn't get adequate transportation.  Her mood is continuing to be highly reactive with what's going on with family esp mother and brother.  Mother self absorbed and can be mean yet demanding.   She doesn't fear getting worse if she stops Spravato  though this has happened in the past.   Lately less dep though can be pulled down quickly by family of origin interactions.  Narcissistic mother.  Thinking of PT work to try to keep her mind more occupied. Plan no changes  01/15/24 appt noted: Current psych meds: Wellbutrin  XL 150 mg AM and Auvelity  1 BID, fluvoxamine  100 mg in AM and 300 mg HS, lorazepam  1 mg 1-2 mg in the AM and HS and 1 tablet prn midday for anxiety, temazepam  30 mg HS. No SE with med changes. Patient received Spravato  84 mg today.  She tolerated it well without any unusual headache, nausea or vomiting or other somatic symptoms.  Dissociation did occur and she gradually saw resolution over the 2-hour period of observation.  She does not typically find the dissociation very strong.  It gets rid of negative emotion for awhile after procedure . She wanted to resume Spravato  bc 30% benefit for dep and anxiety.  Chronic struggles with OCD and family stress and handles it better when on Spravato . No problems with meds and no desire for changes.  01/16/24 appt noted: Current psych meds: Wellbutrin  XL 150 mg AM and  Auvelity  1 BID, fluvoxamine  100 mg in AM and 300 mg HS, lorazepam  1 mg 1-2 mg in the AM and HS and 1 tablet prn midday for anxiety, temazepam   30 mg HS. No SE with med changes. Patient received Spravato  84 mg today.  She tolerated it well without any unusual headache, nausea or vomiting or other somatic symptoms.  Dissociation did occur and she gradually saw resolution over the 2-hour period of observation.  She does not typically find the dissociation very strong.  It gets rid of negative emotion for awhile after procedure . She wanted to resume Spravato  bc 30% benefit for dep and anxiety.  Chronic struggles with OCD and family stress and handles it better when on Spravato . Asks about dropping out of the Wellbutrin .  01/23/24 appt noted: Current psych meds: Wellbutrin  XL 150 mg AM and Auvelity  1 BID, fluvoxamine  100 mg in AM and 300 mg HS, lorazepam  1 mg 1-2 mg in the AM and HS and 1 tablet prn midday for anxiety, temazepam  30 mg HS. No SE with med changes. Patient received Spravato  84 mg today.  She tolerated it well without any unusual headache, nausea or vomiting or other somatic symptoms.  Dissociation did occur and she gradually saw resolution over the 2-hour period of observation.  She does not typically find the dissociation very strong.  It gets rid of negative emotion for awhile after procedure . She wanted to resume Spravato  bc 30% benefit for dep and anxiety.  Chronic struggles with OCD and family stress and handles it better when on Spravato . She is getting benefit from the Spravato  for depression and anxiety Plan no med changes  02/13/24 appt noted: Current psych meds: Wellbutrin  XL 150 mg AM and Auvelity  1 BID, fluvoxamine  100 mg in AM and 300 mg HS, lorazepam  1 mg 1-2 mg in the AM and HS and 1 tablet prn midday for anxiety, temazepam  30 mg HS. No SE with med changes. Patient received Spravato  84 mg today.  She tolerated it well without any unusual headache, nausea or vomiting or other  somatic symptoms.  Dissociation did occur and she gradually saw resolution over the 2-hour period of observation.  She does not typically find the dissociation very strong.  It gets rid of negative emotion for awhile after procedure . She continues to note considerable improvement in depression and anxiety when she misses provide a treatment on an every 2-week basis which is much as she can arrange given her transportation issues.  She is chronically stressed by her dysfunctional family of origin.  She recognizes she does not handle distressed easily.  She states benefit from the medication and does not want any changes  02/27/24 appt noted:  Current psych meds: Wellbutrin  XL 150 mg AM and Auvelity  1 BID, fluvoxamine  100 mg in AM and 300 mg HS, lorazepam  1 mg 1-2 mg in the AM and HS and 1 tablet prn midday for anxiety, temazepam  30 mg HS. No SE nor desire for med changes. Patient received Spravato  84 mg today.  She tolerated it well without any unusual headache, nausea or vomiting or other somatic symptoms.  Dissociation did occur and she gradually saw resolution over the 2-hour period of observation.  She does not typically find the dissociation very strong.  It gets rid of negative emotion for awhile after procedure . Able to leave without assistance. Benefit Spravato  for mood and OCD and wants to continue.  No desire for med change.  Ongoing stressors with family.  OCD is chronic.     Previous psych med trials include Prozac, paroxetine, sertraline, fluvoxamine , venlafaxine, Anafranil with no response,  Wellbutrin , Viibryd, Trintellix 10 1 month NR Auvelity  BID  Geodon,  risperidone, Rexulti, Abilify,  Seroquel, Latuda 40 mg with irritability.   lamotrigine lithium,  BuSpar, Namenda,  pramipexole with no response, and Topamax, pindolol  2 brothers & 1 sister poverty, 1 B with untreated bipolar disorder lives with mother  ECT-MADRS    Flowsheet Row Office Visit from 06/29/2021 in [facility]  [facility]  MADRS Total Score 36      Flowsheet Row Admission (Discharged) from 06/11/2021 in [facility]  C-SSRS RISK CATEGORY No Risk       Review of Systems:  Review of Systems  Constitutional:  Positive for fatigue.  Cardiovascular:  Negative for palpitations and leg swelling.  Musculoskeletal:  Positive for arthralgias, back pain, gait problem, myalgias and neck pain.  Neurological:  Positive for weakness and headaches.  Psychiatric/Behavioral:  Positive for dysphoric mood and sleep disturbance. Negative for suicidal ideas. The patient is nervous/anxious.     Medications: I have reviewed the patient's current medications.  Current Outpatient Medications  Medication Sig Dispense Refill   Abaloparatide (TYMLOS) 3120 MCG/1.56ML SOPN Inject into the skin.     Azelastine-Fluticasone  137-50 MCG/ACT SUSP Place 1-2 sprays into both nostrils daily.     baclofen  (LIORESAL ) 10 MG tablet Take 20 mg by mouth at bedtime as needed for muscle spasms.     buPROPion  (WELLBUTRIN  XL) 150 MG 24 hr tablet TAKE 1 TABLET (150 MG TOTAL) BY MOUTH IN THE MORNING 90 tablet 0   Dextromethorphan-buPROPion  ER (AUVELITY ) 45-105 MG TBCR Take 1 tablet by mouth in the morning and at bedtime. 60 tablet 1   dicyclomine (BENTYL) 10 MG capsule Take 10 mg by mouth daily.     docusate sodium  (COLACE) 100 MG capsule Take 1 capsule (100 mg total) by mouth 2 (two) times daily. (Patient taking differently: Take 100 mg by mouth daily.) 10 capsule 0   Esketamine HCl, 84 MG Dose, (SPRAVATO , 84 MG DOSE,) 28 MG/DEVICE SOPK USE 3 SPRAYS IN EACH NOSTRIL ONCE A WEEK 3 each 1   fexofenadine (ALLEGRA) 180 MG tablet Take 180 mg by mouth daily.     fluvoxaMINE  (LUVOX ) 100 MG tablet TAKE 1 TABLET IN THE AM AND 3 TABLETS AT NIGHT 120 tablet 2   hydrocortisone  (ANUSOL -HC) 2.5 % rectal cream Place rectally 2 (two) times daily. x 7-14 days 30 g 0   ketotifen  (ZADITOR ) 0.025 % ophthalmic solution Place 3  drops into both eyes at bedtime.     LORazepam  (ATIVAN ) 1 MG tablet TAKE 1-2 TABS EVERY MORNING, 1-2 TABS AT BEDTIME & 1 TAB IN THE AFTERNOON WHEN NEEDED -ANXIETY/SLEEP 150 tablet 1   magnesium gluconate (MAGONATE) 500 MG tablet Take 500 mg by mouth daily.     meloxicam  (MOBIC ) 15 MG tablet Take 1 tablet (15 mg total) by mouth daily. 30 tablet 0   meloxicam  (MOBIC ) 15 MG tablet TAKE 1 TABLET (15 MG TOTAL) BY MOUTH DAILY. 30 tablet 0   methylPREDNISolone  (MEDROL  DOSEPAK) 4 MG TBPK tablet Take as directed 21 each 0   MIBELAS 24 FE 1-20 MG-MCG(24) CHEW Chew 1 tablet by mouth at bedtime as needed (bowel regularity).     Multiple Vitamins-Minerals (ADULT GUMMY PO) Take 2 tablets by mouth in the morning.     nitrofurantoin (MACRODANTIN) 100 MG capsule Take 100 mg by mouth as needed (For urinary tract infection.).      oxyCODONE -acetaminophen  (PERCOCET/ROXICET) 5-325 MG tablet Take 1-2 tablets by mouth every 6 (six) hours as needed for severe pain. 50 tablet 0  polyethylene glycol (MIRALAX  / GLYCOLAX ) packet Take 17 g by mouth daily as needed for mild constipation. 14 each 0   propranolol  ER (INDERAL  LA) 60 MG 24 hr capsule TAKE 1 CAPSULE BY MOUTH EVERY DAY 30 capsule 2   psyllium (METAMUCIL) 58.6 % powder Take 1 packet by mouth daily as needed (constipation).     SUMAtriptan  (IMITREX ) 100 MG tablet Take 1 tablet (100 mg total) by mouth every 2 (two) hours as needed for migraine. May repeat in 2 hours if headache persists or recurs. 10 tablet 2   temazepam  (RESTORIL ) 30 MG capsule TAKE 1 CAPSULE BY MOUTH AT BEDTIME AS NEEDED FOR SLEEP 30 capsule 0   Vitamin D-Vitamin K (VITAMIN K2-VITAMIN D3 PO) Take 1-2 sprays by mouth daily.     No current facility-administered medications for this visit.    Medication Side Effects: None   Allergies:  Allergies  Allergen Reactions   Hydrocodone  Itching   Sulfamethoxazole-Trimethoprim Itching   Dust Mite Extract Other (See Comments)    Sneezing, watery eyes,  runny nose   Latex Itching   Other Other (See Comments)    PT IS ALLERGIC TO CAT DANDER AND RAGWEED - Sneezing, watery eyes, runny nose    Pollen Extract Other (See Comments)    Sneezing, watery eyes, runny nose     Past Medical History:  Diagnosis Date   Abnormal Pap smear 2011   hpv/mild dysplasia,cin1   Anxiety    Cerebral palsy (HCC)    right arm/leg   Cystocele    Depression    Headache    Neuromuscular disorder (HCC)    Cerebral Palsy   OCD (obsessive compulsive disorder)    Osteoporosis    Uterine prolaps     Family History  Problem Relation Age of Onset   Cancer Father        skin AND LUNG   Alcohol abuse Sister        CRACK COCAINE    Social History   Socioeconomic History   Marital status: Married    Spouse name: Not on file   Number of children: Not on file   Years of education: Not on file   Highest education level: Not on file  Occupational History   Not on file  Tobacco Use   Smoking status: Never   Smokeless tobacco: Never  Substance and Sexual Activity   Alcohol use: Not Currently    Comment: OCCASIONAL beer   Drug use: No   Sexual activity: Yes    Birth control/protection: Pill    Comment: LOESTRIN 24 FE  Other Topics Concern   Not on file  Social History Narrative   Not on file   Social Drivers of Health   Financial Resource Strain: Not on file  Food Insecurity: Not on file  Transportation Needs: Not on file  Physical Activity: Not on file  Stress: Not on file  Social Connections: Not on file  Intimate Partner Violence: Not on file    Past Medical History, Surgical history, Social history, and Family history were reviewed and updated as appropriate.   Please see review of systems for further details on the patient's review from today.   Objective:   Physical Exam:  LMP  (LMP Unknown)   Physical Exam Constitutional:      General: She is not in acute distress. Neurological:     Mental Status: She is alert and oriented  to person, place, and time.     Cranial Nerves: No  dysarthria.     Motor: Weakness present.     Gait: Gait abnormal.  Psychiatric:        Attention and Perception: Attention and perception normal.        Mood and Affect: Mood is anxious and depressed. Affect is not labile.        Speech: Speech normal. Speech is not rapid and pressured or slurred.        Behavior: Behavior normal. Behavior is cooperative.        Thought Content: Thought content normal. Thought content is not delusional. Thought content does not include homicidal or suicidal ideation. Thought content does not include suicidal plan.        Cognition and Memory: Cognition and memory normal. Cognition is not impaired.        Judgment: Judgment normal.     Comments: Insight intact Ongoing OCD remains fairly severe but less anxious Checking compulsions now about 30 mins Chronic depression persistent but better than it was a year ago better than before Spravato        Lab Review:     Component Value Date/Time   NA 138 06/11/2021 0606   K 4.0 06/11/2021 0606   CL 107 06/11/2021 0606   CO2 26 06/11/2021 0606   GLUCOSE 90 06/11/2021 0606   BUN 18 06/11/2021 0606   CREATININE 0.81 06/11/2021 0606   CALCIUM 9.4 06/11/2021 0606   PROT 6.5 06/11/2021 0606   ALBUMIN  3.3 (L) 06/11/2021 0606   AST 17 06/11/2021 0606   ALT 14 06/11/2021 0606   ALKPHOS 141 (H) 06/11/2021 0606   BILITOT 0.2 (L) 06/11/2021 0606   GFRNONAA >60 06/11/2021 0606   GFRAA >60 07/09/2016 0438       Component Value Date/Time   WBC 5.8 06/11/2021 0606   RBC 4.12 06/11/2021 0606   HGB 12.5 06/11/2021 0606   HCT 39.7 06/11/2021 0606   PLT 299 06/11/2021 0606   MCV 96.4 06/11/2021 0606   MCH 30.3 06/11/2021 0606   MCHC 31.5 06/11/2021 0606   RDW 13.9 06/11/2021 0606   LYMPHSABS 1.9 06/11/2021 0606   MONOABS 0.5 06/11/2021 0606   EOSABS 0.1 06/11/2021 0606   BASOSABS 0.0 06/11/2021 0606    No results found for: "POCLITH", "LITHIUM"   No  results found for: "PHENYTOIN", "PHENOBARB", "VALPROATE", "CBMZ"   .res Assessment: Plan:    Casey "[name]" was seen today for follow-up, depression, anxiety, stress and sleeping problem.  Diagnoses and all orders for this visit:  Recurrent major depression resistant to treatment (HCC) (r/o MDD+Dysthymia)  Insomnia due to mental condition  Mixed obsessional thoughts and acts  Social anxiety disorder  Migraine without aura and without status migrainosus, not intractable    Both primary Dx of OCD and major depression are TR and marked.  Impaired function but less so with Spravato  re: depression..   She has been receiving Spravato  84 mg weekly and moderate improvement in the depression..  she feels it also helps OCD somewhat.  However still easily overwhelmed with low stress tolerance.  Family contributes to her anxiety and stress markedly and triggers mood changes  The OCD is improved with the increase in fluvoxamine  and with Spravato .  Spends up to 2 hours daily and checking compulsions on her worst days but better when she travels.  No new options for tx are evident.  She has been on higher doses of fluvoxamine  above the usual max of 400 mg daily in the past and finds it more helpful  at the higher dose.    Disc SE.   She is tolerating the meds well  Continue  Luvox  back to 400 mg nightly as of January 2023. Disc dosing higher than usual.  She feels this is increase has helped more with OCD which remains chronically severe.  Continue Auvelity  BID and reduced Wellbutrin  to 1 AM. It has helped and is tolerated.   We have discussed seizure risk that is possible using this combination but given severity of her symptoms she feels the risk is warranted.  Do not consider stopping this until she gets the full benefit of Spravato .  Disc dosing again. There are few other alternative medication options that remain.   Disc DDI issues.   Consider olanzapine for TR anxiety and TRD but sig risk  weight gain. She doesn't want to try this now.  We discussed the short-term risks associated with benzodiazepines including sedation and increased fall risk among others.  Discussed long-term side effect risk including dependence, potential withdrawal symptoms, and the potential eventual dose-related risk of dementia.  But recent studies from 2020 dispute this association between benzodiazepines and dementia risk. Newer studies in 2020 do not support an association with dementia. Disc this is high dose and not ideal.  Also disc risk combining it with temazepam . Rec try gradually reduce HS lorazepam  to 1 mg Hs.  Can continue lorazepam  2 mg AM and 1 mg in afternoon bc of chronic anxiety and it is helpful and tolerated. She can continue temazepam  30 mg nightly.  She tends to have a lot of anxious negative thoughts at night when she is trying to go to bed.  She is trying to reduce the dose.  Complaining of HA and history migraine.  Asks for increase imitrex  and disc preventatives like propranolol  ER imitrex  to 100 mg prn migraine and propranolol  ER for migraine prevention.  Counseling 30 min on how strongly her family's behavior affects her mood.  Looked at some reasons for this and techniques to address her mood and response.  Disc mother and Brothers in particular Disc trasportation problems limiting access to Spravato .  Disc alternative options.   Also working on trying to develop social and spiritual goals.  No med changes  continue Auvelity  1 in AM & PM continue Wellbutrin  XL 150 mg AM Fluvoxamine  100 mg AM and 300 mg PM above usual max bc med necessary Lorazepam  2 mg HS and prn 1 mg BID prn anxiety daily Temazepam  30 mg HS Propranolol  ER 60 mg daily Imitrex  prn migraine  She wants to rcontinue biweekly Spravato  now bc dep worse without it.  FU 2-weeks   Nori Beat, MD, DFAPA  Please see After Visit Summary for patient specific instructions.  Future Appointments  Date Time Provider  Department Center  03/12/2024  3:00 PM Cottle, Kennedy Peabody., MD [facility] None  03/12/2024  3:00 PM CP-NURSE [facility] None  03/21/2024 11:30 AM [facility]  03/27/2024  1:00 PM Maretta Shaper, PhD [facility] None  04/10/2024  1:00 PM Maretta Shaper, PhD [facility] None  04/25/2024  2:00 PM Maretta Shaper, PhD [facility] None  05/07/2024 11:00 AM Maretta Shaper, PhD [facility] None  05/21/2024 11:00 AM Maretta Shaper, PhD [facility] None  06/04/2024 11:00 AM Maretta Shaper, PhD [facility] None    No orders of the defined types were placed in this encounter.    -------------------------------

## 2024-03-12 ENCOUNTER — Ambulatory Visit

## 2024-03-12 ENCOUNTER — Ambulatory Visit: Admitting: Psychiatry

## 2024-03-12 ENCOUNTER — Other Ambulatory Visit: Payer: Self-pay | Admitting: Podiatry

## 2024-03-12 VITALS — BP 129/96 | HR 82

## 2024-03-12 DIAGNOSIS — F339 Major depressive disorder, recurrent, unspecified: Secondary | ICD-10-CM

## 2024-03-12 DIAGNOSIS — G43009 Migraine without aura, not intractable, without status migrainosus: Secondary | ICD-10-CM

## 2024-03-12 DIAGNOSIS — F5105 Insomnia due to other mental disorder: Secondary | ICD-10-CM

## 2024-03-12 DIAGNOSIS — F4010 Social phobia, unspecified: Secondary | ICD-10-CM

## 2024-03-12 DIAGNOSIS — Z636 Dependent relative needing care at home: Secondary | ICD-10-CM

## 2024-03-12 DIAGNOSIS — F422 Mixed obsessional thoughts and acts: Secondary | ICD-10-CM

## 2024-03-12 NOTE — Progress Notes (Signed)
 NURSE NOTE:   Pt arrived for her #71 Spravato  Treatment, she has been trying to come weekly for now. She started Spravato  treatments on 10/07/2021, she continues with 84 mg (3 of the 28 mg) Spravato  nasal spray for treatment resistance depression. Pt is being treated for Treatment Resistant Depression, Pt taken to treatment room. Pt's medication is now billed through medical as buy and bill. Medication is stored behind 2 locked doors, it is never given to the pt until time of administration which is observed by the nurse. Disposed of per FDA/REMS regulations. All Spravato  Treatments are documented in Spravato  REMS per protocol of being a treatment center. Spravato  is a CIII medication and has to be only given at a treatment facility and observed by nurse as pt administered intranasally   She was directed to the treatment room to get vitals taken first. Initial vital signs are B/P at 3:15 PM 121/85, 94, SpO2 92%. Pt instructed to blow her nose and to recline back at 45 degrees. Pt given first nasal spray (28 mg) administered by pt observed by nurse. There were 5 minutes between each dose, total of 84 mg. Tolerated well. Assessed pt's 40 minute vital signs at 4:00 PM 116/78, 81, SpO2 94%.  Pt met with Dr. Toi Foster and they discussed her care at the end of her treatment when her thoughts are clearer. She does go to the bathroom at least once during her treatment. Dissociation resolved prior to discharge.  Discharge vitals at 5:07 PM 129/96, 82, SpO2 95%. Pt stable for discharge.  Pt was observed on site a total of 120 minutes per FDA/REMS requirements. Pt was with nurse for clinical assessment 50 minutes. She plans on returning in two weeks.    LOT [id number] [id number] [id number]

## 2024-03-13 ENCOUNTER — Encounter: Payer: Self-pay | Admitting: Psychiatry

## 2024-03-13 MED ORDER — SUMATRIPTAN SUCCINATE 100 MG PO TABS: 100.0000 mg | ORAL_TABLET | ORAL | 2 refills | Status: DC | PRN

## 2024-03-13 NOTE — Progress Notes (Signed)
 Casey Diaz [id number] 09-27-1966 58 y.o.    Subjective:   Patient ID:  Casey Diaz is a 58 y.o. (DOB 12/09/1965) female.  Chief Complaint:  Chief Complaint  Patient presents with   Follow-up   Depression   Anxiety   Stress   Sleeping Problem     HPI Casey Diaz presents to the office today for follow-up of OCD and severe anxiety.     December 2019 visit the following was noted: No meds were changed. Lives in [name] and back for followup.  Sx are about the same.  Has to take meds with different sizes. Pt reports that mood is Anxious and Depressed and describes anxiety as Severe. Anxiety symptoms include: Excessive Worry, Obsessive Compulsive Symptoms:   Checking,,. Pt reports has interrupted sleep and nocturia. Pt reports that appetite is good. Pt reports that energy is no change and down slightly. Concentration is down slightly. Suicidal thoughts:  denied by patient. Loves the environment of [name] but misses some things there.  She's not able to work there.  H works there and likes it.  Struggled with not working, feels isolated and not up to task of meeting people.  Does attend a church and met a friend who's been helpful.  Leaving for [name] on 10/16/18.   04/09/2020 appointment the following is noted:  Staying another year in [name] bc Covid and other things. Last few months a lot of crying spells.  Is in menopause. Wonders about med changes though is nervous about it.  Crying spells associated with depressing thoughts more than stress or OCD.   Covid really hard on everyone and couldn't see family for 18 mos.  Family still very dysfunctional. No close friends in part due to OCD and depression. Son high Autism spectrum with ADHD and anxiety and she's with him all the time. Greater health problems with CP so more pains.   05/15/20 appt with the following noted: Started Estroven for menopause and helps some. Still depressed.  Chronically. In [place]  for 2 more weeks then  to [name] for another year. A lot of stressors lately triggering more checking and anxiety.   OCD is her CC now and seems.  Got worse DT stress.   Stressed with Asberger's son and her health.  H works a lot.  Her FOO still stress. Plan: Trintellix 10 mg 1 tablet in the morning with food and reduce fluvoxamine  to 5 tablets nightly for 1 week  then reduce it to 4 tablets nightly.   07/02/20 appt with the following noted: Decided not to get Trintellix bc difficulty getting it. It is available.  There.  Wants to start it now.   Both depression and OCD are severe.  Not suicidal in intent or plan. Did not take samples with her to [name] but will be back in December. covid is worse there and travel is difficult.  Wants to reduce Wellbutrin  DT dry mouth. Plan: She's afraid to reduce Luvox  at this time DT fear of worsening OCD.  But will consider. Trintellix 10 mg 1 tablet in the morning with food and reduce fluvoxamine  to 5 tablets nightly for 1 week  then reduce it to 4 tablets nightly. Also reduce Wellbutrin  XL to 300 mg daily.    9-13 2022 appointment with the following noted: [place].  Broke arm a month ago and surgery.  It's all been rough adjustment.   B has cancer on his face and M fell taking him  to the doctor.  Misses the water and weather of [name].   Cry a lot more since menopause. Still depression and anxiety and OCD.  Asks about ketamine. On Wellbutrin  300, Luvox  300.  No Trintellix. Added Ativan  2 mg AM and HS and it helps.  More likely to get upset at night. Plan: Increase Luvox  back to 400 mg daily.  She thinks she's worse on less. Continue Wellbutrin  XL to 300 mg daily. Plan to start Spravato  for TRD asap   09/27/2021 appointment with the following noted:  She has started Spravato  today at 54 mg intranasally.  She tolerated it well without unusual nausea or vomiting headache or other somatic symptoms.  She did have the expected dissociation which gradually  resolved over the course of the 2-hour period of observation.  She was a little concerned about her balance given her cerebral palsy but has not noted unusual or unexpected problems.  She is motivated to can continue Spravato  in hopes of reducing her depressive symptoms. 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  She also has treatment resistant OCD which is partially managed with medications but is still quite disabling.  She is tolerating the medications well.  She is sleeping adequately.  Her appetite is adequate.  She is not having suicidal thoughts.  She continues to wish for a better treatment for OCD that would give her some relief.  09/30/2021 appointment with the following noted: She received her first dose of Spravato  84 mg intranasally today.  She tolerated it well without unusual nausea, vomiting, or other somatic symptoms.  Dissociation as expected did occur and gradually resolved over the 2-hour period of observation.  She did have a mild headache today with the treatment and received ibuprofen 600 mg at her request.  We will follow this to see if it is a pattern Patient is still depressed.  She said she was late with her medicine today and today was a particularly depressing day.  However she notes that the Spravato  has lifted her mood considerably even today.  She is hopeful that it will continue to be helpful.  No suicidal thoughts.  She has ongoing chronic anxiety and OCD at baseline.  10/04/21 appt noted: Patient received Spravato  84 mg for the second time today.  She tolerated it well without any unusual headache, nausea or vomiting or other somatic symptoms.  Dissociation did occur and she gradually [place] resolution over the 2-hour period of observation. She did not have any unusual problems after she left the office last Spravato  administration.  She did not have any specific problems with balance or walking.  She is at increased risk of that  difficulty because of cerebral palsy.  So far she has not noticed much mood effect from the medication beyond the first day of receiving it.  However she would like to continue Spravato  in hopes of getting the antidepressant effect that is desired. Stress dealing with mother's behavior at party pt hosted.  Guilt over it.  10/07/2021 appointment noted: Patient received Spravato  84 mg for the second time today.  She tolerated it well without any unusual headache, nausea or vomiting or other somatic symptoms.  Dissociation did occur and she gradually [place] resolution over the 2-hour period of observation. She still is not sure about the antidepressant effect of Spravato .  Events over the holidays and demands, make it difficult to assess.  She still notes that the OCD tends to worsen the depression and vice versa.  She tolerates the Spravato  well and wants to continue the trial.  10/15/2021 appointment with the following noted: Patient received Spravato  84 mg for the second time today.  She tolerated it well without any unusual headache, nausea or vomiting or other somatic symptoms.  Dissociation did occur and she gradually [place] resolution over the 2-hour period of observation. Patient says it was somewhat difficult to evaluate the effect of the Spravato .  It was scheduled to be twice weekly for 4 weeks consecutively but the holidays have interfered with that administration.  She asked what specifically should be she should be looking for in order to assess improvement.  That was discussed.  The OCD is unchanged and the depression so far is not significantly different.  She still tolerates meds.  There have been no recent med changes  10/19/2021 appt noted: Patient received Spravato  84 mg for the second today.  She tolerated it well without any unusual headache, nausea or vomiting or other somatic symptoms.  Dissociation did occur and she gradually saw resolution over the 2-hour period of observation.    10/21/2021 appointment noted: Patient received Spravato  84 mg today.  She tolerated it well without any unusual headache, nausea or vomiting or other somatic symptoms.  Dissociation did occur and she gradually saw resolution over the 2-hour period of observation.  She feels better than last week.  She is not as depressed and down.  She is still dealing with grief around the death of her cousin that was unexpected.  It is still difficult to tell how much the Spravato  was doing but she is hopeful.  Anxiety is still present with the OCD.  She is not having suicidal thoughts.  She is not hopeless.  She wants to continue treatment.  10/25/2021 appointment with the following noted: Patient received Spravato  84 mg today.  She tolerated it well without any unusual headache, nausea or vomiting or other somatic symptoms.  Dissociation did occur and she gradually saw resolution over the 2-hour period of observation.  She does not typically find the dissociation very strong. She is beginning to think the Spravato  is helping somewhat with the depression.  It has been difficult to tell with the holidays intervening as well as the death of her cousin.  She has not been able to get Spravato  twice weekly for 4 weeks straight as typically planned.  However she is hopeful.  The OCD remains significant.  She still has a tendency to think very negatively.  She is not suicidal.  10/28/2021 appointment with the following noted: Patient received Spravato  84 mg today.  She tolerated it well without any unusual headache, nausea or vomiting or other somatic symptoms.  Dissociation did occur and she gradually saw resolution over the 2-hour period of observation.  She does not typically find the dissociation very strong. She is feeling more hopeful about the administration of Spravato .  She is having less depression she believes.  Still not dramatically different.  She still has a tendency to have a lot of anxiety and rumination and  OCD.  She is not suicidal.  She is eager to continue the Spravato .  11/01/2021 appointment with the following noted: Patient received Spravato  84 mg today.  She tolerated it well without any unusual headache, nausea or vomiting or other somatic symptoms.  Dissociation did occur and she gradually saw resolution over the 2-hour period of observation.  She does not typically find the dissociation very strong. She is continuing to see a little bit of  improvement in depression with Spravato .  The anxiety remains but may be not as severe.  The OCD remains markedly severe chronically.  She is not suicidal.  She is encouraged by the degree of improvement with Spravato  and inability to enjoy things more and not be quite as ruminative.  11/04/2021 appt noted: Patient received Spravato  84 mg today.  She tolerated it well without any unusual headache, nausea or vomiting or other somatic symptoms.  Dissociation did occur and she gradually saw resolution over the 2-hour period of observation.  She does not typically find the dissociation very strong. No SE complaints with meds. She continues to feel hopeful about the Spravato .  She has less depression.  Because of a number of factors she is uncertain of the full benefit but thinks she is somewhat less depressed.  Her anxiety and OCD remain significant but a little better.  She is tolerating the medications and does not desire medicine change.  She is not currently complaining of insomnia.   11/08/2021 appointment the following noted: Patient received Spravato  84 mg today.  She tolerated it well without any unusual headache, nausea or vomiting or other somatic symptoms.  Dissociation did occur and she gradually saw resolution over the 2-hour period of observation.  She does not typically find the dissociation very strong. No SE complaints with meds. She feels the Spravato  is helping somewhat.  She would like to see a greater effect.  However she is able to enjoy things.   She is productive at home.  She would like to see a lifting of a degree of sadness that remains.  The anxiety and OCD remained largely unchanged.  She wondered about the dosing of Wellbutrin  300 mg a day and Luvox  300 mg a day and possible increases.  She has been at higher doses in the past.  She plans to start water therapy for her weakness and for her shoulder.  11/11/2021 appointment with the following noted: Patient received Spravato  84 mg today.  She tolerated it well without any unusual headache, nausea or vomiting or other somatic symptoms.  Dissociation did occur and she gradually saw resolution over the 2-hour period of observation.  She does not typically find the dissociation very strong. No SE complaints with meds. She feels the Spravato  is clearly helping the depression.  She would like to see a more significant effect.  She is still having trouble thinking positive. Her energy is fair.  Concentration is good except for the problem with chronic obsessions. She has been taking Wellbutrin  300 mg in Luvox  300 mg for quite some time but has taken higher doses in the past.  We discussed that.  She would like to try higher doses in order to get a better effect if possible. We just increased the doses a couple of days ago.  No effect yet.  11/15/2021 appointment with the following noted: Patient received Spravato  84 mg today.  She tolerated it well without any unusual headache, nausea or vomiting or other somatic symptoms.  Dissociation did occur and she gradually saw resolution over the 2-hour period of observation.  She does not typically find the dissociation very strong. No SE complaints with meds. The patient is now convinced that the Spravato  is helping the depression.  She would like to continue twice weekly Spravato  this week if possible.  She has tolerated the increase in Wellbutrin  to 450 mg daily and the increase and fluvoxamine  to 400 mg daily without complications thus far.  The OCD  and anxiety feed the depression to some extent. She spends approximately 2 hours daily with checking compulsions due to obsessions about causing harm to others.  For example fearing that when she has hit a pot hole that she may have hit a person and going back to check.  Checking corners and rooms out of fear that she may have harmed someone.  Other various checking compulsions.  She is hoping the increase in fluvoxamine  to 400 mg will reduce that over the weeks to come.  She is not seeing a significant difference with the addition of the Spravato  though she understands that was not expected.  She is more productive at home and more motivated and able to enjoy things more fully as a result of the Spravato  treatment.  She is tolerating the medication  11/18/2021 appointment with the following noted: Patient received Spravato  84 mg today.  She tolerated it well without any unusual headache, nausea or vomiting or other somatic symptoms.  Dissociation did occur and she gradually saw resolution over the 2-hour period of observation.  She does not typically find the dissociation very strong. No SE complaints with meds. She clearly believes the Spravato  has been helpful for the depression.  She wonders whether to continue to treatments weekly or to cut back to 1 weekly.  She would like to continue twice weekly in hopes of getting additional improvement in the depression because it is not resolved but it is difficult to get here twice a week in terms of arranging rides. She is recently increased Wellbutrin  XL to 450 mg daily and fluvoxamine  to 400 mg daily but they have not had time to have an official effect.  She is tolerating that well.  She is tolerating meds overwork overall well. The OCD remains the same as noted on 11/15/2021  11/25/21 appt noted: Patient received Spravato  84 mg today.  She tolerated it well without any unusual headache, nausea or vomiting or other somatic symptoms.  Dissociation did occur and  she gradually saw resolution over the 2-hour period of observation.  She does not typically find the dissociation very strong. No SE complaints with meds. She thinks the increase in Wellbutrin  and Luvox  have been potentially helpful for depression and OCD respectively.  It has been too early to see the full effect.  She is sleeping and eating well.  She is functioning at home.  She still spends a lot of time that is about 2 hours a day dealing with compulsive behaviors.  12/02/21 appt noted: Patient received Spravato  84 mg today.  She tolerated it well without any unusual headache, nausea or vomiting or other somatic symptoms.  Dissociation did occur and she gradually saw resolution over the 2-hour period of observation.  She does not typically find the dissociation very strong. No SE complaints with meds. Several losses and stressors recently that affect her sense of mood. However still sees significant benefit from the Spravato  for her depression.  Wants to continue it. Suspect early  some benefit from the increased Wellbutrin  for depression and Luvox  for OCD. Tolerating meds. No complaints about the meds. Sleeping and eating well.  No new health concerns.  12/09/21 appt noted: Patient received Spravato  84 mg today.  She tolerated it well without any unusual headache, nausea or vomiting or other somatic symptoms.  Dissociation did occur and she gradually saw resolution over the 2-hour period of observation.  She does not typically find the dissociation very strong. No SE complaints with meds. Seeing noticeable  improvement from increase fluvoxamine  to 400 mg daily.  Tolerating meds without concerns over them. Depression is stable with residual sx of easy guilt and easily stressed.  OCD contributes to depression but depression is not severe with less crying spells.  Productive at home with chores.  Enjoyed recent birthday.  Sleeping good. No new concerns.  12/23/2021 appointment noted: Patient  received Spravato  84 mg today.  She tolerated it well without any unusual headache, nausea or vomiting or other somatic symptoms.  Dissociation did occur and she gradually saw resolution over the 2-hour period of observation.  She does not typically find the dissociation very strong. No SE complaints with meds. Seeing noticeable improvement from increase fluvoxamine  to 400 mg daily.  Tolerating meds without concerns over them. Her depression is somewhat improved with the Spravato .  She also feels generally a little lighter.  She is more motivated.  She is less overwhelmed by guilt.  The OCD is gradually improving but is still quite time-consuming as noted before.  She is sleeping well.  No side effects  12/30/2021 appointment with the following noted: Patient received Spravato  84 mg today.  She tolerated it well without any unusual headache, nausea or vomiting or other somatic symptoms.  Dissociation did occur and she gradually saw resolution over the 2-hour period of observation.  She does not typically find the dissociation very strong. No SE complaints with meds. Seeing noticeable improvement from increase fluvoxamine  to 400 mg daily.  Tolerating meds without concerns over them. She is confident of her the improvement seen with Spravato .  She is less hopeless.  Guilt is marked remarkably improved.  She is not having any thoughts of death or dying.  She is more motivated for activities such as exercise which she is recently started.  She is sleeping well. The OCD remains severe but it is improving somewhat with the increase in fluvoxamine .  It is still consuming a couple hours per day.  01/10/22 apravato 84 admin  01/24/22 appt noted: Patient received Spravato  84 mg today.  She tolerated it well without any unusual headache, nausea or vomiting or other somatic symptoms.  Dissociation did occur and she gradually saw resolution over the 2-hour period of observation.  She does not typically find the  dissociation very strong. No SE complaints with meds. Very tearful today.  Feels like she has been suppressing emotion in the Spravato  caused it to be released.  Discussed some stressors.  Overall still feels the medicine is helpful.  She has missed some of the scheduled Spravato  treatments that were intended to be weekly due to circumstances beyond her control.  She is still struggling with OCD as previously noted but does believe the medications are helpful. Plan no med changes  01/31/2022 received Spravato  84 mg today  02/09/2022 appointment with the following noted: Patient received Spravato  84 mg today.  She tolerated it well without any unusual headache, nausea or vomiting or other somatic symptoms.  Dissociation did occur and she gradually saw resolution over the 2-hour period of observation.  She does not typically find the dissociation very strong. No SE complaints with meds. Spravato  clearly helps depression and OCD but easily gets overwhelmed and tearful with fairly routine stressors.  Tolerating meds. Sleep and appetite is OK Asks to increase lorazepam  to 2 mg AM and HS and 1mg  afternoon  02/16/22 appt noted: Patient received Spravato  84 mg today.  She tolerated it well without any unusual headache, nausea or vomiting or other somatic symptoms.  Dissociation did occur and she gradually saw resolution over the 2-hour period of observation.  She does not typically find the dissociation very strong. No SE complaints with meds. She has chronic depesssion and OCD but is improved with Spravato , both dx versus before.  She has continued Luvox  400 mg and Wellbutrin  450 mg and is tolerating it.  Chronically easily stressed.  Tolerating all meds.  Doesn't like taking more meds.  Spending a couple hours daily with OCD.  No SI No med changes.  02/21/22 appt noted:   Doesn't like taking more meds.  Spending a couple hours daily with OCD.  No SI No med changes.  02/21/22 appt noted: Patient received  Spravato  84 mg today.  She tolerated it well without any unusual headache, nausea or vomiting or other somatic symptoms.  Dissociation did occur and she gradually saw resolution over the 2-hour period of observation.  She does not typically find the dissociation very strong. No SE complaints with meds. She has chronic depesssion and OCD but is improved with Spravato , both dx versus before.  She has continued Luvox  400 mg and Wellbutrin  450 mg and is tolerating it.  Chronically easily overwhelmed and doesn't know why.  Tolerating all meds. Wants to continue meds.  03/16/22 appt noted: Patient received Spravato  84 mg today.  She tolerated it well without any unusual headache, nausea or vomiting or other somatic symptoms.  Dissociation did occur and she gradually saw resolution over the 2-hour period of observation.  She does not typically find the dissociation very strong. No SE complaints with meds. Overall she still feels the Spravato  has been helpful not only for her depression but also for her OCD which was somewhat unexpected.  OCD is still significant but it is less severe than prior to starting Spravato .  She is tolerating Luvox  400 mg and Wellbutrin  450 mg.  We discussed possible med adjustments.  03/23/22 appt noted: Patient received Spravato  84 mg today.  She tolerated it well without any unusual headache, nausea or vomiting or other somatic symptoms.  Dissociation did occur and she gradually saw resolution over the 2-hour period of observation.  She does not typically find the dissociation very strong. No SE complaints with meds. She is still depressed and still has OCD of course but is improved with the Spravato .  She is tolerating the medications well.  We had previously discussed the possibility of switching some of the Wellbutrin  to Auvelity  and she is very interested in that in hopes of further improvement in depression and OCD.  She understands that Auvelity  is not used for OCD on the label.   She is tolerating the medications.  She is still easily overwhelmed.  She is sleeping and eating okay.. Plan: Reduce Wellbutrin  XL to 300 mg AM and add Auvelity  1 tablet each AM  03/30/22 appt noted: Patient received Spravato  84 mg today.  She tolerated it well without any unusual headache, nausea or vomiting or other somatic symptoms.  Dissociation did occur and she gradually saw resolution over the 2-hour period of observation.  She does not typically find the dissociation very strong. No SE complaints with meds. She is still depressed and still has OCD of course but is improved with the Spravato .  She is tolerating the medications well.  No difference with Auvelity  1 AM so far and no SE.  Going on vacation on Saturday. 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 Sleep and appetite good. Plan: Increase Auvelity  to 1 twice daily and  reduce Wellbutrin  to XL 150 every morning  04/14/2022 appointment with the following noted: Patient received Spravato  84 mg today.  She tolerated it well without any unusual headache, nausea or vomiting or other somatic symptoms.  Dissociation did occur and she gradually saw resolution over the 2-hour period of observation.  She does not typically find the dissociation very strong. No SE complaints with meds. She is still depressed and still has OCD of course but is improved with the Spravato .  She is tolerating the medications well.  Just increased Auvelity  to BID yesterday and reduced Wellbutrin  to 150 AM. No SE so far.  No change in mood or anxiety so far.  Chronic OCD as noted and residual depression and chronic fatigue.  04/21/2022 appointment with the following noted: Patient received Spravato  84 mg today.  She tolerated it well without any unusual headache, nausea or vomiting or other somatic symptoms.  Dissociation did occur and she gradually saw resolution over the 2-hour period of observation.  She does not typically find the dissociation very strong. No  SE complaints with meds. She is still depressed and still has OCD of course but is improved with the Spravato .   She has questions about the dosing of lorazepam . She tends to have negative anxious thoughts at night.  This tends to interfere with her ability to go to sleep.  She is getting about 8 to 9 hours of sleep.  She is tolerating the meds without excessive sedation and does not nap during the day.  05/06/22 appt noted: Patient received Spravato  84 mg today.  She tolerated it well without any unusual headache, nausea or vomiting or other somatic symptoms.  Dissociation did occur and she gradually saw resolution over the 2-hour period of observation.  She does not typically find the dissociation very strong. No SE complaints with meds. She is still depressed and still has OCD of course but is improved with the Spravato .   Had some questions about timing of dosing of fluvoxamine  and Auvelity . OCD is not quite as time consuming.  Sleep and eating are the same.   No SE meds.  05/25/22 appt noted: Patient received Spravato  84 mg today.  She tolerated it well without any unusual headache, nausea or vomiting or other somatic symptoms.  Dissociation did occur and she gradually saw resolution over the 2-hour period of observation.  She does not typically find the dissociation very strong. No SE complaints with meds. She is still depressed and still has OCD of course but is improved with the Spravato .   She has less OCD when away from home and on vacation of note. Plan: Rec gradually reduce HS lorazepam  to 1 mg Hs.  Can continue lorazepam  2 mg AM and 1 mg in afternoon bc of chronic anxiety and it is helpful and tolerated. She can continue temazepam  30 mg nightly.  She tends to have a lot of anxious negative thoughts at night when she is trying to go to bed  06/16/22 appt noted: Patient received Spravato  84 mg today.  She tolerated it well without any unusual headache, nausea or vomiting or other somatic  symptoms.  Dissociation did occur and she gradually saw resolution over the 2-hour period of observation.  She does not typically find the dissociation very strong. No SE complaints with meds. She is still depressed and still has OCD of course but is improved with the Spravato .   She has less OCD when away from home and on vacation of note. She  is tolerating the medications.  She has continued current medications. 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  She still receives benefit from Spravato .  Her depression and OCD are better with the Spravato .  She thinks she is getting additional benefit with the switch from Wellbutrin  to Auvelity .  07/04/2022 appointment noted: Reports she developed a rash on her face from Auvelity  and feels like she is allergic to it.  She stopped it and went back to Wellbutrin  450 mg every morning.  The rash has cleared up.  She did not require any medical attention and did not have shortness of breath. Overall her depression and OCD are about the same as they have been.  She did not notice a substantial difference from the brief treatment with Auvelity  but she understands she did not take a full course.  She is tolerating the current medicines well. Current meds fluvoxamine  400 mg daily, Wellbutrin  XL 450 mg daily, lorazepam  1 to 2 mg in the morning and 1 to 2 mg at night and 1 mg in the afternoon, temazepam  30 mg nightly. She wants to continue the Spravato  because she feels it has been helpful for both her depression and her racing OCD  07/18/22 appt noted: Patient received Spravato  84 mg today.  She tolerated it well without any unusual headache, nausea or vomiting or other somatic  symptoms.  Dissociation did occur and she gradually saw resolution over the 2-hour period of observation.  She does not typically find the dissociation very strong. No SE complaints with meds. She is still depressed and still has OCD of course but is improved with the Spravato .  Rash better off Auvelity  and back on Welllbutrin XL 450 mg AM, fluvoxamine  400 mg daily.  08/15/22 appt noted: Current psych meds: Wellbutrin  XL 450 mg AM, fluvoxamine  100 mg in AM and 300 mg HS, lorazepam  1 mg 1-2 mg in the AM and HS and 1 tablet prn midday for anxiety, temazepam  30 mg HS Patient received Spravato  84 mg today.  She tolerated it well without any unusual headache, nausea or vomiting or other somatic symptoms.  Dissociation did occur and she gradually saw resolution over the 2-hour period of observation.  She does not typically find the dissociation very strong. No SE complaints with meds. She has a great deal of stress dealing with her family.  Disc brother's ongoing mania and difficulty getting him help and the stress he causes for the family. She wants to continue Spravato  through this very stressful holdicay season and reevaluate the frequency after the New Year.  09/12/22 appt noted: Current psych meds: Wellbutrin  XL 450 mg AM, fluvoxamine  100 mg in AM and 300 mg HS, lorazepam  1 mg 1-2 mg in the AM and HS and 1 tablet prn midday for anxiety, temazepam  30 mg HS Patient received Spravato  84 mg today.  She tolerated it well without any unusual headache, nausea or vomiting or other somatic symptoms.  Dissociation did occur and she gradually saw resolution over the 2-hour period of observation.  She does not typically find the dissociation very strong. No SE complaints with meds. She has a great deal of stress dealing with her family.  Disc brother's ongoing mania and difficulty getting him help and the stress he causes for the family. She wants to continue Spravato  through this very stressful holdicay season  and reevaluate the frequency after the New Year.  Chronically easily overwhelmed with family. Complaining of HA and history migraine.  Asks for increase imitrex  and disc preventatives like propranolol  ER  09/26/22 appt noted: Current psych meds: Wellbutrin  XL 450 mg AM, fluvoxamine  100 mg in AM and 300 mg HS, lorazepam  1 mg 1-2 mg in the AM and HS and 1 tablet prn midday for anxiety, temazepam  30 mg HS Patient received Spravato  84 mg today.  She tolerated it well without any unusual headache, nausea or vomiting or other somatic symptoms.  Dissociation did occur and she gradually saw resolution over the 2-hour period of observation.  She does not typically find the dissociation very strong. No SE complaints with meds. She has a great deal of stress dealing with her family.  This is ongoing The holidays are much more stressful DT family problems.  She is noting OCD is much worse over the last couple of week.  Depression is better with Spravato . Needed higher dose meds for migraine.   10/03/22 appt noted: Current psych meds: Wellbutrin  XL 450 mg AM, fluvoxamine  100 mg in AM and 300 mg HS, lorazepam  1 mg 1-2 mg in the AM and HS and 1 tablet prn midday for anxiety, temazepam  30 mg HS Patient received Spravato  84 mg today.  She tolerated it well without any unusual headache, nausea or vomiting or other somatic symptoms.  Dissociation did occur and she gradually saw resolution over the 2-hour period of observation.  She does not typically find the dissociation very strong. No SE complaints with meds. She has now realized that the rash she previously previously attributed to Auvelity  was not related.  She is interested may be retrying that after the holidays.  She is tolerating medications otherwise. The holidays remain chronically stressful to her due to family dynamic problems which cause her to consistently feel stuck.  Under more stress her OCD is worse.  She will have a tendency to have crying spells.   The depression and OCD are still improved with Spravato  as compared to before.  11/15/22 appt noted: Current psych meds: Wellbutrin  XL 450 mg AM, fluvoxamine  100 mg in AM and 300 mg HS, lorazepam  1 mg 1-2 mg in the AM and HS and 1 tablet prn midday for anxiety, temazepam  30 mg HS Patient received Spravato  84 mg today.  She tolerated it well without any unusual headache, nausea or vomiting or other somatic symptoms.  Dissociation did occur and she gradually saw resolution over the 2-hour period of observation.  She does not typically find the dissociation very strong. No SE complaints with meds. Continues to feel depressed and overwhelmed by family problems including her brother's mania and recent eviction and commitment.  Chronic OCD worse when stressed.  No SI.  Tolerating meds. Plan: Per her request continue Wellbutrin  XL 450 mg every morning. She has come to the realization that the rash she had previously attributed to Auvelity  was not related.  She is interested in perhaps retrying Auvelity . There are few alternative medication options that remain.  01/11/23 appt noted: Current psych meds: Wellbutrin  XL 150 mg BID and started Auvelity  1 AM, fluvoxamine  100 mg in AM and 300 mg HS, lorazepam  1 mg 1-2 mg in the AM and HS and 1 tablet prn midday for anxiety, temazepam  30 mg HS  Patient received Spravato  84 mg today.  She tolerated it well without any unusual headache, nausea or vomiting or other somatic symptoms.  Dissociation did occur and she gradually saw resolution over the 2-hour period of observation.  She does not typically find the dissociation very strong. No SE complaints with meds. Trouble getting to sessions lately DT transportation problems.   Continues to feel depressed and overwhelmed by OCD and family.  Feels she needs toevery other week Spravato  bc it helps for a couple of weeks and then seems to wear off.  Struggleing with OCD and depression both of which are eased by Spravato . Less  crying with Auvelity .  02/07/23 appt noted: Current psych meds: Wellbutrin  XL 150 mg BID and started Auvelity  1 AM, fluvoxamine  100 mg in AM and 300 mg HS, lorazepam  1 mg 1-2 mg in the AM and HS and 1 tablet prn midday for anxiety, temazepam  30 mg HS Patient received Spravato  84 mg today.  She tolerated it well without any unusual headache, nausea or vomiting or other somatic symptoms.  Dissociation did occur and she gradually saw resolution over the 2-hour period of observation.  She does not typically find the dissociation very strong. No SE complaints with meds. Trouble getting to sessions lately DT transportation problems.  This is a problem ongoing and thinks she might need to pause Spravato  bc won't be able to get her for at least 3 weeks. She is holding pretty steady with a moderate level of anxiety and depression ongoing and chronic.    03/20/23 appt noted: Current psych meds: Wellbutrin  XL 150 mg BID and started Auvelity  1 AM, fluvoxamine  100 mg in AM and 300 mg HS, lorazepam  1 mg 1-2 mg in the AM and HS and 1 tablet prn midday for anxiety, temazepam  30 mg HS Patient received Spravato  84 mg today.  She tolerated it well without any unusual headache, nausea or vomiting or other somatic symptoms.  Dissociation did occur and she gradually saw resolution over the 2-hour period of observation.  She does not typically find the dissociation very strong. No SE complaints with meds. She is trying to get back into more regular Spravato  administration.  Family issues and transportation problems that led to her missing Spravato .  She feels more depressed without regular Spravato .  Her OCD is chronic but also worse when she misses Spravato .  She does not want any medication changes.  04/03/23 appt noted: Current psych meds: Wellbutrin  XL 150 mg BID and started Auvelity  1 AM, fluvoxamine  100 mg in AM and 300 mg HS, lorazepam  1 mg 1-2 mg in the AM and HS and 1 tablet prn midday for anxiety, temazepam  30 mg  HS Patient received Spravato  84 mg today.  She tolerated it well without any unusual headache, nausea or vomiting or other somatic symptoms.  Dissociation did occur and she gradually saw resolution over the 2-hour period of observation.  She does not typically find the dissociation very strong.  It gets rid of negative emotion for awhile after procedure and would like it to last longer.   No SE complaints with meds.  Doesn't really want med changes.   Planning to weekly Spravato .  It is helping dep and anxiety.    04/10/23 appt noted: Current psych meds: Wellbutrin  XL 150 mg BID and started Auvelity  1 AM, fluvoxamine  100 mg in AM and 300 mg HS, lorazepam  1 mg 1-2 mg in the AM and HS and 1 tablet prn midday for anxiety, temazepam  30 mg  HS Patient received Spravato  84 mg today.  She tolerated it well without any unusual headache, nausea or vomiting or other somatic symptoms.  Dissociation did occur and she gradually saw resolution over the 2-hour period of observation.  She does not typically find the dissociation very strong.  It gets rid of negative emotion for awhile after procedure and would like it to last longer.   No SE complaints with meds.  Doesn't really want med changes.   Had lidocaine  shot for back pain with brief benefit.  Doing some water based PT Spravato  went well today. Got pretty upset last week with event related to son's activities.  Got upset with son saying something inappropriate publicly.  Was so embarrassed.  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    He's kind of rebellious lately.  Son is 39 yo.    05/03/23 appt noted: Current psych meds: Wellbutrin  XL 150 mg BID and started Auvelity  1 AM, fluvoxamine  100 mg in AM and 300 mg HS, lorazepam  1 mg 1-2 mg in the AM and HS and 1 tablet prn midday for anxiety, temazepam  30 mg HS No SE Patient received Spravato  84 mg today.  She tolerated it well without any unusual headache, nausea or  vomiting or other somatic symptoms.  Dissociation did occur and she gradually saw resolution over the 2-hour period of observation.  She does not typically find the dissociation very strong.  It gets rid of negative emotion for awhile after procedure and would like it to last longer.   Still pleased with benefit from Spravato  for mood and anxiety. No SE complaints with meds.  Doesn't really want med changes.   Chronic family px ongoing affects her but nothing she can change.H sick of the family drama.   Continuing counseling helps.  Has some support. Mood and anxiety pretty steady but did go on vacation to [place].  Anxiety worse first in AM and then later at night with mind racing on stressors.   Still back trouble.  05/23/23 appt noted: Current psych meds: Wellbutrin  XL 150 mg BID and started Auvelity  1 AM, fluvoxamine  100 mg in AM and 300 mg HS, lorazepam  1 mg 1-2 mg in the AM and HS and 1 tablet prn midday for anxiety, temazepam  30 mg HS No SE Patient received Spravato  84 mg today.  She tolerated it well without any unusual headache, nausea or vomiting or other somatic symptoms.  Dissociation did occur and she gradually saw resolution over the 2-hour period of observation.  She does not typically find the dissociation very strong.  It gets rid of negative emotion for awhile after procedure and would like it to last longer.   Still pleased with benefit from Spravato  for mood and anxiety by 50%. No SE complaints with meds. Chronic family stress interferes with mental health and self care.  May have to reduce frequency of Spravato  bc of this. But is status quo with meds.  Chronic residual OCD which is mod severe and dep moderate.  05/30/23 appt noted: Current psych meds: Wellbutrin  XL 150 mg BID and started Auvelity  1 AM, fluvoxamine  100 mg in AM and 300 mg HS, lorazepam  1 mg 1-2 mg in the AM and HS and 1 tablet prn midday for anxiety, temazepam  30 mg HS No SE Patient received Spravato  84 mg  today.  She tolerated it well without any unusual headache, nausea or vomiting or other somatic symptoms.  Dissociation did occur and she gradually saw resolution over the 2-hour period of observation.  She does not typically find the dissociation very strong.  It gets rid of negative emotion for awhile after procedure and would like it to last longer.   Still pleased with benefit from Spravato  for mood and anxiety by 50% or better but not resolved. She wants to continue Spravato  as frequently as schedule will allow. Both depression and OCD are better with most improvement in mood.  Asks about any new treatments for OCD. No SE complaints with meds. Chronic family stress interferes with mental health and self care.  This is an ongoing drain on her mood and resources emotionally.    06/06/23 appt noted: Current psych meds: Wellbutrin  XL 150 mg BID and started Auvelity  1 AM, fluvoxamine  100 mg in AM and 300 mg HS, lorazepam  1 mg 1-2 mg in the AM and HS and 1 tablet prn midday for anxiety, temazepam  30 mg HS No SE Patient received Spravato  84 mg today.  She tolerated it well without any unusual headache, nausea or vomiting or other somatic symptoms.  Dissociation did occur and she gradually saw resolution over the 2-hour period of observation.  She does not typically find the dissociation very strong.  It gets rid of negative emotion for awhile after procedure and would like it to last longer.   Still believes each meds work and are helpful and well tolerated.  Specifically she thinks that the Auvelity  adds additional benefit on taking Wellbutrin .  She believes the meds help both depression and OCD though the OCD remains a problem.  She also believes Spravato  helps both types of symptoms as well.  Her mood is variable.  She experiences a great deal of stress from her family and those problems wax and wane.  She has bad days because of it and today is 1 of those days.  She is continuing counseling and that is  helpful.  Due to family demands she may have to spread out the Spravato  frequency.  06/16/23 appt noted:  Current psych meds: Wellbutrin  XL 150 mg BID and started Auvelity  1 AM, fluvoxamine  100 mg in AM and 300 mg HS, lorazepam  1 mg 1-2 mg in the AM and HS and 1 tablet prn midday for anxiety, temazepam  30 mg HS No SE Patient received Spravato  84 mg today.  She tolerated it well without any unusual headache, nausea or vomiting or other somatic symptoms.  Dissociation did occur and she gradually saw resolution over the 2-hour period of observation.  She does not typically find the dissociation very strong.  It gets rid of negative emotion for awhile after procedure . She is trying to make the Schedule work for Spravto oh every week or every other week because she is aware the treatment effects can be lost if it is time less frequently.  So she has been able to come the last 2 weeks, we will try to come in 2 weeks.  Spravato  reduces depression and OCD significantly though she remains highly symptomatic with both.  However she has no suicidal thoughts.  Crying spells are reduced with Spravato .  She still spends a fair amount of time meaning over an hour a day checking and more under stress or when tired.  She asks about any new meds for OCD. Plan: increase Auvelity  1 in AM & PM Hold Wellbutrin  XL 150 mg BID  07/12/23 appt noted: Current psych meds: Wellbutrin  XL 150 mg BID and started Auvelity   1 AM, fluvoxamine  100 mg in AM and 300 mg HS, lorazepam  1 mg 1-2 mg in the AM and HS and 1 tablet prn midday for anxiety, temazepam  30 mg HS.  Didn't increase Auvelity  yet No SE Patient received Spravato  84 mg today.  She tolerated it well without any unusual headache, nausea or vomiting or other somatic symptoms.  Dissociation did occur and she gradually saw resolution over the 2-hour period of observation.  She does not typically find the dissociation very strong.  It gets rid of negative emotion for awhile after  procedure . She has not increased the Auvelity  yet is planned.  She has a little bit of nervousness about it but admits is not rational.  She is willing to give that a try to try to get better control of depression and anxiety.  She continues to see the benefit of Spravato  every other week.  She is struggling with some pain due to plantar fasciitis and given her cerebral palsy that makes it even more difficult to walk and to engage in normal activities.  She is willing to seek treatment for that.  07/25/23 appt noted: Current psych meds: Wellbutrin  XL 150 mg AM and started Auvelity  1 BID, fluvoxamine  100 mg in AM and 300 mg HS, lorazepam  1 mg 1-2 mg in the AM and HS and 1 tablet prn midday for anxiety, temazepam  30 mg HS.  Didn't increase Auvelity  yet No SE Patient received Spravato  84 mg today.  She tolerated it well without any unusual headache, nausea or vomiting or other somatic symptoms.  Dissociation did occur and she gradually saw resolution over the 2-hour period of observation.  She does not typically find the dissociation very strong.  It gets rid of negative emotion for awhile after procedure . Chronic dep and anxiety to some extent.  But has increased Auvelity  to BID recently and no SE yet.  Disc frequency Spravato  bc benefit and will try to continue every other week and not less if possible. No further concerns for meds.  08/08/23 appt noted: Current psych meds: Wellbutrin  XL 150 mg AM and Auvelity  1 BID, fluvoxamine  100 mg in AM and 300 mg HS, lorazepam  1 mg 1-2 mg in the AM and HS and 1 tablet prn midday for anxiety, temazepam  30 mg HS.  Didn't increase Auvelity  yet No SE with med changes. Patient received Spravato  84 mg today.  She tolerated it well without any unusual headache, nausea or vomiting or other somatic symptoms.  Dissociation did occur and she gradually saw resolution over the 2-hour period of observation.  She does not typically find the dissociation very strong.  It gets rid  of negative emotion for awhile after procedure . Mornings still worse with dep and anxiety.  But mood and anxiety are better with the increase in Auvelity  to BID.  OCD seems a little better.  Chronic difficulty handling the complaints and stress from her family.  Easily stresser her out.  No med changes desire. She will have to take a break from Spravato  DT pending foot surgery.  Mobility and transportation will be limited.  08/25/23 appt noted: Current psych meds: Wellbutrin  XL 150 mg AM and Auvelity  1 BID, fluvoxamine  100 mg in AM and 300 mg HS, lorazepam  1 mg 1-2 mg in the AM and HS and 1 tablet prn midday for anxiety, temazepam  30 mg HS.  Didn't increase Auvelity  yet No SE with med changes. Patient received Spravato  84 mg today.  She tolerated it well  without any unusual headache, nausea or vomiting or other somatic symptoms.  Dissociation did occur and she gradually saw resolution over the 2-hour period of observation.  She does not typically find the dissociation very strong.  It gets rid of negative emotion for awhile after procedure . Pending foot surgery next week.  Able to work in [employer]  this week which helps with dep and anxiety and OCD.  Struggles with sleep without multiple meds as noted.  No adverse effects.  Will return to [place]  asap after foot surgery  10/04/23 appt noted: Current psych meds: Wellbutrin  XL 150 mg AM and Auvelity  1 BID, fluvoxamine  100 mg in AM and 300 mg HS, lorazepam  1 mg 1-2 mg in the AM and HS and 1 tablet prn midday for anxiety, temazepam  30 mg HS.  Didn't increase Auvelity  yet No SE with med changes. Patient received Spravato  84 mg today.  She tolerated it well without any unusual headache, nausea or vomiting or other somatic symptoms.  Dissociation did occur and she gradually saw resolution over the 2-hour period of observation.  She does not typically find the dissociation very strong.  It gets rid of negative emotion for awhile after procedure . Mood and  anxiety stable.  Benefit with meds and Spravato .   She reports OCD time consumption typically is much better than in the past when it was routinely 2 hours daily.  Now less than have of that.    Chronic caregiver stress with dysfunctional family and has trouble with boundaries.  Working on that in therapy.  10/24/23 appt noted: Current psych meds: Wellbutrin  XL 150 mg AM and Auvelity  1 BID, fluvoxamine  100 mg in AM and 300 mg HS, lorazepam  1 mg 1-2 mg in the AM and HS and 1 tablet prn midday for anxiety, temazepam  30 mg HS.  No SE with med changes. She plans to stop Spravato  bc couldn't get adequate transportation.  Her mood is continuing to be highly reactive with what's going on with family esp mother and brother.  Mother self absorbed and can be mean yet demanding.   She doesn't fear getting worse if she stops Spravato  though this has happened in the past.   Lately less dep though can be pulled down quickly by family of origin interactions.  Narcissistic mother.  Thinking of PT work to try to keep her mind more occupied. Plan no changes  01/15/24 appt noted: Current psych meds: Wellbutrin  XL 150 mg AM and Auvelity  1 BID, fluvoxamine  100 mg in AM and 300 mg HS, lorazepam  1 mg 1-2 mg in the AM and HS and 1 tablet prn midday for anxiety, temazepam  30 mg HS. No SE with med changes. Patient received Spravato  84 mg today.  She tolerated it well without any unusual headache, nausea or vomiting or other somatic symptoms.  Dissociation did occur and she gradually saw resolution over the 2-hour period of observation.  She does not typically find the dissociation very strong.  It gets rid of negative emotion for awhile after procedure . She wanted to resume Spravato  bc 30% benefit for dep and anxiety.  Chronic struggles with OCD and family stress and handles it better when on Spravato . No problems with meds and no desire for changes.  01/16/24 appt noted: Current psych meds: Wellbutrin  XL 150 mg AM and  Auvelity  1 BID, fluvoxamine  100 mg in AM and 300 mg HS, lorazepam  1 mg 1-2 mg in the AM and HS and 1 tablet prn midday for anxiety, temazepam   30 mg HS. No SE with med changes. Patient received Spravato  84 mg today.  She tolerated it well without any unusual headache, nausea or vomiting or other somatic symptoms.  Dissociation did occur and she gradually saw resolution over the 2-hour period of observation.  She does not typically find the dissociation very strong.  It gets rid of negative emotion for awhile after procedure . She wanted to resume Spravato  bc 30% benefit for dep and anxiety.  Chronic struggles with OCD and family stress and handles it better when on Spravato . Asks about dropping out of the Wellbutrin .  01/23/24 appt noted: Current psych meds: Wellbutrin  XL 150 mg AM and Auvelity  1 BID, fluvoxamine  100 mg in AM and 300 mg HS, lorazepam  1 mg 1-2 mg in the AM and HS and 1 tablet prn midday for anxiety, temazepam  30 mg HS. No SE with med changes. Patient received Spravato  84 mg today.  She tolerated it well without any unusual headache, nausea or vomiting or other somatic symptoms.  Dissociation did occur and she gradually saw resolution over the 2-hour period of observation.  She does not typically find the dissociation very strong.  It gets rid of negative emotion for awhile after procedure . She wanted to resume Spravato  bc 30% benefit for dep and anxiety.  Chronic struggles with OCD and family stress and handles it better when on Spravato . She is getting benefit from the Spravato  for depression and anxiety Plan no med changes  02/13/24 appt noted: Current psych meds: Wellbutrin  XL 150 mg AM and Auvelity  1 BID, fluvoxamine  100 mg in AM and 300 mg HS, lorazepam  1 mg 1-2 mg in the AM and HS and 1 tablet prn midday for anxiety, temazepam  30 mg HS. No SE with med changes. Patient received Spravato  84 mg today.  She tolerated it well without any unusual headache, nausea or vomiting or other  somatic symptoms.  Dissociation did occur and she gradually saw resolution over the 2-hour period of observation.  She does not typically find the dissociation very strong.  It gets rid of negative emotion for awhile after procedure . She continues to note considerable improvement in depression and anxiety when she misses provide a treatment on an every 2-week basis which is much as she can arrange given her transportation issues.  She is chronically stressed by her dysfunctional family of origin.  She recognizes she does not handle distressed easily.  She states benefit from the medication and does not want any changes  02/27/24 appt noted:  Current psych meds: Wellbutrin  XL 150 mg AM and Auvelity  1 BID, fluvoxamine  100 mg in AM and 300 mg HS, lorazepam  1 mg 1-2 mg in the AM and HS and 1 tablet prn midday for anxiety, temazepam  30 mg HS. No SE nor desire for med changes. Patient received Spravato  84 mg today.  She tolerated it well without any unusual headache, nausea or vomiting or other somatic symptoms.  Dissociation did occur and she gradually saw resolution over the 2-hour period of observation.  She does not typically find the dissociation very strong.  It gets rid of negative emotion for awhile after procedure . Able to leave without assistance. Benefit Spravato  for mood and OCD and wants to continue.  No desire for med change.  Ongoing stressors with family.  OCD is chronic.    03/12/2024 appointment noted: Current psych meds: Wellbutrin  XL 150 mg AM and Auvelity  1 BID, fluvoxamine  100 mg in AM and 300 mg HS, lorazepam   1 mg 1-2 mg in the AM and HS and 1 tablet prn midday for anxiety, temazepam  30 mg HS. No SE nor desire for med changes. Patient received Spravato  84 mg today.  She tolerated it well without any unusual headache, nausea or vomiting or other somatic symptoms.  Dissociation did occur and she gradually saw resolution over the 2-hour period of observation.  She does not typically find  the dissociation very strong.  No med changes desired. Continues to receive benefit from Spravato  and reducing depression and helping to moderate the OCD.  Specifically she is 50% less depressed and has about 25 to 30% less OCD using Spravato .  She is chronically stressed with a dysfunctional family.  She is working on boundary issues with her therapist.  Previous psych med trials include Prozac, paroxetine, sertraline, fluvoxamine , venlafaxine, Anafranil with no response,  Wellbutrin , Viibryd, Trintellix 10 1 month NR Auvelity  BID   Geodon,  risperidone, Rexulti, Abilify,  Seroquel, Latuda 40 mg with irritability.   lamotrigine lithium,  BuSpar, Namenda,  pramipexole with no response, and Topamax, pindolol  2 brothers & 1 sister poverty, 1 B with untreated bipolar disorder lives with mother  ECT-MADRS    Flowsheet Row Office Visit from 06/29/2021 in [facility]  MADRS Total Score 36      Flowsheet Row Admission (Discharged) from 06/11/2021 in [facility]  C-SSRS RISK CATEGORY No Risk       Review of Systems:  Review of Systems  Constitutional:  Positive for fatigue.  Cardiovascular:  Negative for palpitations.  Musculoskeletal:  Positive for arthralgias, back pain, gait problem, myalgias and neck pain.  Neurological:  Positive for weakness and headaches.  Psychiatric/Behavioral:  Positive for dysphoric mood and sleep disturbance. Negative for suicidal ideas. The patient is nervous/anxious.     Medications: I have reviewed the patient's current medications.  Current Outpatient Medications  Medication Sig Dispense Refill   Abaloparatide (TYMLOS) 3120 MCG/1.56ML SOPN Inject into the skin.     Azelastine-Fluticasone  137-50 MCG/ACT SUSP Place 1-2 sprays into both nostrils daily.     baclofen  (LIORESAL ) 10 MG tablet Take 20 mg by mouth at bedtime as needed for muscle spasms.     buPROPion  (WELLBUTRIN  XL) 150 MG 24 hr tablet TAKE 1  TABLET (150 MG TOTAL) BY MOUTH IN THE MORNING 90 tablet 0   Dextromethorphan-buPROPion  ER (AUVELITY ) 45-105 MG TBCR Take 1 tablet by mouth in the morning and at bedtime. 60 tablet 1   dicyclomine (BENTYL) 10 MG capsule Take 10 mg by mouth daily.     docusate sodium  (COLACE) 100 MG capsule Take 1 capsule (100 mg total) by mouth 2 (two) times daily. (Patient taking differently: Take 100 mg by mouth daily.) 10 capsule 0   Esketamine HCl, 84 MG Dose, (SPRAVATO , 84 MG DOSE,) 28 MG/DEVICE SOPK USE 3 SPRAYS IN EACH NOSTRIL ONCE A WEEK 3 each 1   fexofenadine (ALLEGRA) 180 MG tablet Take 180 mg by mouth daily.     fluvoxaMINE  (LUVOX ) 100 MG tablet TAKE 1 TABLET IN THE AM AND 3 TABLETS AT NIGHT 120 tablet 2   hydrocortisone  (ANUSOL -HC) 2.5 % rectal cream Place rectally 2 (two) times daily. x 7-14 days 30 g 0   ketotifen  (ZADITOR ) 0.025 % ophthalmic solution Place 3 drops into both eyes at bedtime.     LORazepam  (ATIVAN ) 1 MG tablet TAKE 1-2 TABS EVERY MORNING, 1-2 TABS AT BEDTIME & 1 TAB IN THE AFTERNOON WHEN NEEDED -ANXIETY/SLEEP 150  tablet 1   magnesium gluconate (MAGONATE) 500 MG tablet Take 500 mg by mouth daily.     meloxicam  (MOBIC ) 15 MG tablet Take 1 tablet (15 mg total) by mouth daily. 30 tablet 0   meloxicam  (MOBIC ) 15 MG tablet TAKE 1 TABLET (15 MG TOTAL) BY MOUTH DAILY. 30 tablet 0   methylPREDNISolone  (MEDROL  DOSEPAK) 4 MG TBPK tablet Take as directed 21 each 0   MIBELAS 24 FE 1-20 MG-MCG(24) CHEW Chew 1 tablet by mouth at bedtime as needed (bowel regularity).     Multiple Vitamins-Minerals (ADULT GUMMY PO) Take 2 tablets by mouth in the morning.     nitrofurantoin (MACRODANTIN) 100 MG capsule Take 100 mg by mouth as needed (For urinary tract infection.).      oxyCODONE -acetaminophen  (PERCOCET/ROXICET) 5-325 MG tablet Take 1-2 tablets by mouth every 6 (six) hours as needed for severe pain. 50 tablet 0   polyethylene glycol (MIRALAX  / GLYCOLAX ) packet Take 17 g by mouth daily as needed for mild  constipation. 14 each 0   propranolol  ER (INDERAL  LA) 60 MG 24 hr capsule TAKE 1 CAPSULE BY MOUTH EVERY DAY 30 capsule 2   psyllium (METAMUCIL) 58.6 % powder Take 1 packet by mouth daily as needed (constipation).     temazepam  (RESTORIL ) 30 MG capsule TAKE 1 CAPSULE BY MOUTH AT BEDTIME AS NEEDED FOR SLEEP 30 capsule 0   Vitamin D-Vitamin K (VITAMIN K2-VITAMIN D3 PO) Take 1-2 sprays by mouth daily.     SUMAtriptan  (IMITREX ) 100 MG tablet Take 1 tablet (100 mg total) by mouth every 2 (two) hours as needed for migraine. May repeat in 2 hours if headache persists or recurs. 12 tablet 2   No current facility-administered medications for this visit.    Medication Side Effects: None   Allergies:  Allergies  Allergen Reactions   Hydrocodone  Itching   Sulfamethoxazole-Trimethoprim Itching   Dust Mite Extract Other (See Comments)    Sneezing, watery eyes, runny nose   Latex Itching   Other Other (See Comments)    PT IS ALLERGIC TO CAT DANDER AND RAGWEED - Sneezing, watery eyes, runny nose    Pollen Extract Other (See Comments)    Sneezing, watery eyes, runny nose     Past Medical History:  Diagnosis Date   Abnormal Pap smear 2011   hpv/mild dysplasia,cin1   Anxiety    Cerebral palsy (HCC)    right arm/leg   Cystocele    Depression    Headache    Neuromuscular disorder (HCC)    Cerebral Palsy   OCD (obsessive compulsive disorder)    Osteoporosis    Uterine prolaps     Family History  Problem Relation Age of Onset   Cancer Father        skin AND LUNG   Alcohol abuse Sister        CRACK COCAINE    Social History   Socioeconomic History   Marital status: Married    Spouse name: Not on file   Number of children: Not on file   Years of education: Not on file   Highest education level: Not on file  Occupational History   Not on file  Tobacco Use   Smoking status: Never   Smokeless tobacco: Never  Substance and Sexual Activity   Alcohol use: Not Currently    Comment:  OCCASIONAL beer   Drug use: No   Sexual activity: Yes    Birth control/protection: Pill    Comment: LOESTRIN 24 FE  Other Topics Concern   Not on file  Social History Narrative   Not on file   Social Drivers of Health   Financial Resource Strain: Not on file  Food Insecurity: Not on file  Transportation Needs: Not on file  Physical Activity: Not on file  Stress: Not on file  Social Connections: Not on file  Intimate Partner Violence: Not on file    Past Medical History, Surgical history, Social history, and Family history were reviewed and updated as appropriate.   Please see review of systems for further details on the patient's review from today.   Objective:   Physical Exam:  LMP  (LMP Unknown)   Physical Exam Constitutional:      General: She is not in acute distress. Neurological:     Mental Status: She is alert and oriented to person, place, and time.     Cranial Nerves: No dysarthria.     Motor: Weakness present.     Gait: Gait abnormal.  Psychiatric:        Attention and Perception: Attention and perception normal.        Mood and Affect: Mood is anxious and depressed. Affect is not labile or tearful.        Speech: Speech normal. Speech is not rapid and pressured or slurred.        Behavior: Behavior normal. Behavior is cooperative.        Thought Content: Thought content normal. Thought content is not delusional. Thought content does not include homicidal or suicidal ideation. Thought content does not include suicidal plan.        Cognition and Memory: Cognition and memory normal. Cognition is not impaired.        Judgment: Judgment normal.     Comments: Insight intact Ongoing OCD remains fairly severe but less anxious Checking compulsions now about 30 mins Chronic depression persistent but better than it was a year ago better than before Spravato        Lab Review:     Component Value Date/Time   NA 138 06/11/2021 0606   K 4.0 06/11/2021 0606   CL  107 06/11/2021 0606   CO2 26 06/11/2021 0606   GLUCOSE 90 06/11/2021 0606   BUN 18 06/11/2021 0606   CREATININE 0.81 06/11/2021 0606   CALCIUM 9.4 06/11/2021 0606   PROT 6.5 06/11/2021 0606   ALBUMIN  3.3 (L) 06/11/2021 0606   AST 17 06/11/2021 0606   ALT 14 06/11/2021 0606   ALKPHOS 141 (H) 06/11/2021 0606   BILITOT 0.2 (L) 06/11/2021 0606   GFRNONAA >60 06/11/2021 0606   GFRAA >60 07/09/2016 0438       Component Value Date/Time   WBC 5.8 06/11/2021 0606   RBC 4.12 06/11/2021 0606   HGB 12.5 06/11/2021 0606   HCT 39.7 06/11/2021 0606   PLT 299 06/11/2021 0606   MCV 96.4 06/11/2021 0606   MCH 30.3 06/11/2021 0606   MCHC 31.5 06/11/2021 0606   RDW 13.9 06/11/2021 0606   LYMPHSABS 1.9 06/11/2021 0606   MONOABS 0.5 06/11/2021 0606   EOSABS 0.1 06/11/2021 0606   BASOSABS 0.0 06/11/2021 0606    No results found for: "POCLITH", "LITHIUM"   No results found for: "PHENYTOIN", "PHENOBARB", "VALPROATE", "CBMZ"   .res Assessment: Plan:    Sharmila "[name]" was seen today for follow-up, depression, anxiety, stress and sleeping problem.  Diagnoses and all orders for this visit:  Recurrent major depression resistant to treatment (HCC) (r/o MDD+Dysthymia)  Mixed obsessional thoughts and  acts  Social anxiety disorder  Caregiver stress  Migraine without aura and without status migrainosus, not intractable -     SUMAtriptan  (IMITREX ) 100 MG tablet; Take 1 tablet (100 mg total) by mouth every 2 (two) hours as needed for migraine. May repeat in 2 hours if headache persists or recurs.  Insomnia due to mental condition   Both primary Dx of OCD and major depression are TR and marked.  Impaired function but less so with Spravato  re: depression..   She has been receiving Spravato  84 mg weekly and moderate improvement in the depression..  she feels it also helps OCD somewhat.  However still easily overwhelmed with low stress tolerance.  Family contributes to her anxiety and stress markedly  and triggers mood changes  The OCD is improved with the increase in fluvoxamine  and with Spravato .  Spends up to 2 hours daily and checking compulsions on her worst days but better when she travels.  No new options for tx are evident.  She has been on higher doses of fluvoxamine  above the usual max of 400 mg daily in the past and finds it more helpful at the higher dose.    Disc SE.   She is tolerating the meds well  Continue  Luvox  back to 400 mg nightly as of January 2023. Disc dosing higher than usual.  She feels this is increase has helped more with OCD which remains chronically severe.  Continue Auvelity  BID and reduced Wellbutrin  to 1 AM. It has helped and is tolerated.   We have discussed seizure risk that is possible using this combination but given severity of her symptoms she feels the risk is warranted.  Do not consider stopping this until she gets the full benefit of Spravato .  Disc dosing again. There are few other alternative medication options that remain.   Disc DDI issues.   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 She doesn't want to try this now.  We discussed the short-term risks associated with benzodiazepines including sedation and increased fall risk among others.  Discussed long-term side effect risk including dependence, potential withdrawal symptoms, and the potential eventual dose-related risk of dementia.  But recent studies from 2020 dispute this association between benzodiazepines and dementia risk. Newer studies in 2020 do not support an association with dementia. Disc this is high dose and not ideal.  Also disc risk combining it with temazepam . Rec try gradually reduce HS lorazepam  to 1 mg Hs.  Can continue lorazepam  2 mg AM and 1 mg in afternoon bc of chronic anxiety and it is helpful and tolerated. She can continue temazepam  30 mg nightly.  She tends to have a lot of anxious negative thoughts at night when she is trying to go to bed.   She is trying to reduce the dose.  Complaining of HA and history migraine.  Asks for increase imitrex  and disc preventatives like propranolol  ER imitrex  to 100 mg prn migraine and propranolol  ER for migraine prevention.  Counseling 30 min on how strongly her family's behavior affects her mood.  Looked at some reasons for this and techniques to address her mood and response.  Disc mother and Brothers in particular Disc trasportation problems limiting access to Spravato .  Disc alternative options.   Also working on trying to develop social and spiritual goals.  No med changes  continue Auvelity  1 in AM & PM continue Wellbutrin  XL 150 mg AM Fluvoxamine  100 mg AM and 300 mg PM  above usual max bc med necessary Lorazepam  2 mg HS and prn 1 mg BID prn anxiety daily Temazepam  30 mg HS Propranolol  ER 60 mg daily Imitrex  prn migraine.  She wants to increase #12/month  She wants to rcontinue biweekly Spravato  now bc dep worse without it.  FU 2-weeks   Nori Beat, MD, DFAPA  Please see After Visit Summary for patient specific instructions.  Future Appointments  Date Time Provider Department Center  03/21/2024 11:30 AM [facility]  03/27/2024  1:00 PM Maretta Shaper, PhD [facility] None  04/10/2024  1:00 PM Maretta Shaper, PhD [facility] None  04/25/2024  2:00 PM Maretta Shaper, PhD [facility] None  05/24/2024 10:00 AM Maretta Shaper, PhD [facility] None  06/04/2024  3:00 PM Maretta Shaper, PhD [facility] None    No orders of the defined types were placed in this encounter.    -------------------------------

## 2024-03-21 ENCOUNTER — Other Ambulatory Visit

## 2024-03-26 ENCOUNTER — Ambulatory Visit

## 2024-03-26 ENCOUNTER — Ambulatory Visit: Admitting: Psychiatry

## 2024-03-26 ENCOUNTER — Encounter: Payer: Self-pay | Admitting: Psychiatry

## 2024-03-26 VITALS — BP 118/81 | HR 90

## 2024-03-26 DIAGNOSIS — F339 Major depressive disorder, recurrent, unspecified: Secondary | ICD-10-CM

## 2024-03-26 DIAGNOSIS — Z636 Dependent relative needing care at home: Secondary | ICD-10-CM

## 2024-03-26 DIAGNOSIS — F4010 Social phobia, unspecified: Secondary | ICD-10-CM

## 2024-03-26 DIAGNOSIS — F5105 Insomnia due to other mental disorder: Secondary | ICD-10-CM

## 2024-03-26 DIAGNOSIS — G43009 Migraine without aura, not intractable, without status migrainosus: Secondary | ICD-10-CM

## 2024-03-26 DIAGNOSIS — F422 Mixed obsessional thoughts and acts: Secondary | ICD-10-CM

## 2024-03-26 NOTE — Progress Notes (Signed)
 NURSE NOTE:   Pt arrived for her #72 Spravato  Treatment, she has been trying to come weekly for now. She started Spravato  treatments on 10/07/2021, she continues with 84 mg (3 of the 28 mg) Spravato  nasal spray for treatment resistance depression. Pt is being treated for Treatment Resistant Depression, Pt taken to treatment room. Pt's medication is now billed through medical as buy and bill. Medication is stored behind 2 locked doors, it is never given to the pt until time of administration which is observed by the nurse. Disposed of per FDA/REMS regulations. All Spravato  Treatments are documented in Spravato  REMS per protocol of being a treatment center. Spravato  is a CIII medication and has to be only given at a treatment facility and observed by nurse as pt administered intranasally   She was directed to the treatment room to get vitals taken first. Initial vital signs are B/P at 3:00 PM 120/97, 106, SpO2 98%. Pt instructed to blow her nose and to recline back at 45 degrees. Pt given first nasal spray (28 mg) administered by pt observed by nurse. There were 5 minutes between each dose, total of 84 mg. Tolerated well. Assessed pt's 40 minute vital signs at 3:52 PM 108/88, 93, SpO2 96%.  Pt met with Dr. Toi Foster and they discussed her care at the end of her treatment when her thoughts are clearer. She does go to the bathroom at least once during her treatment. Dissociation resolved prior to discharge.  Discharge vitals at 4:55 PM 118/81, 90, SpO2 94%. Pt stable for discharge.  Pt was observed on site a total of 120 minutes per FDA/REMS requirements. Pt was with nurse for clinical assessment 50 minutes.     LOT [id number] EXP JAN 2027

## 2024-03-26 NOTE — Progress Notes (Signed)
 Casey Diaz [id number] December 24, 1965 58 y.o.    Subjective:   Patient ID:  Casey Diaz is a 58 y.o. (DOB January 20, 1966) female.  Chief Complaint:  Chief Complaint  Patient presents with   Follow-up   Depression   Anxiety   Stress   Sleeping Problem     HPI Casey Diaz presents to the office today for follow-up of OCD and severe anxiety.     December 2019 visit the following was noted: No meds were changed. Lives in [name] and back for followup.  Sx are about the same.  Has to take meds with different sizes. Pt reports that mood is Anxious and Depressed and describes anxiety as Severe. Anxiety symptoms include: Excessive Worry, Obsessive Compulsive Symptoms:   Checking,,. Pt reports has interrupted sleep and nocturia. Pt reports that appetite is good. Pt reports that energy is no change and down slightly. Concentration is down slightly. Suicidal thoughts:  denied by patient. Loves the environment of [name] but misses some things there.  She's not able to work there.  H works there and likes it.  Struggled with not working, feels isolated and not up to task of meeting people.  Does attend a church and met a friend who's been helpful.  Leaving for [name] on 10/16/18.   04/09/2020 appointment the following is noted:  Staying another year in [name] bc Covid and other things. Last few months a lot of crying spells.  Is in menopause. Wonders about med changes though is nervous about it.  Crying spells associated with depressing thoughts more than stress or OCD.   Covid really hard on everyone and couldn't see family for 18 mos.  Family still very dysfunctional. No close friends in part due to OCD and depression. Son high Autism spectrum with ADHD and anxiety and she's with him all the time. Greater health problems with CP so more pains.   05/15/20 appt with the following noted: Started Estroven for menopause and helps some. Still depressed.  Chronically. In [place]  for 2 more weeks then  to [name] for another year. A lot of stressors lately triggering more checking and anxiety.   OCD is her CC now and seems.  Got worse DT stress.   Stressed with Asberger's son and her health.  H works a lot.  Her FOO still stress. Plan: Trintellix 10 mg 1 tablet in the morning with food and reduce fluvoxamine  to 5 tablets nightly for 1 week  then reduce it to 4 tablets nightly.   07/02/20 appt with the following noted: Decided not to get Trintellix bc difficulty getting it. It is available.  There.  Wants to start it now.   Both depression and OCD are severe.  Not suicidal in intent or plan. Did not take samples with her to [name] but will be back in December. covid is worse there and travel is difficult.  Wants to reduce Wellbutrin  DT dry mouth. Plan: She's afraid to reduce Luvox  at this time DT fear of worsening OCD.  But will consider. Trintellix 10 mg 1 tablet in the morning with food and reduce fluvoxamine  to 5 tablets nightly for 1 week  then reduce it to 4 tablets nightly. Also reduce Wellbutrin  XL to 300 mg daily.    9-13 2022 appointment with the following noted: [place].  Broke arm a month ago and surgery.  It's all been rough adjustment.   B has cancer on his face and M fell taking him  to the doctor.  Misses the water and weather of [name].   Cry a lot more since menopause. Still depression and anxiety and OCD.  Asks about ketamine. On Wellbutrin  300, Luvox  300.  No Trintellix. Added Ativan  2 mg AM and HS and it helps.  More likely to get upset at night. Plan: Increase Luvox  back to 400 mg daily.  She thinks she's worse on less. Continue Wellbutrin  XL to 300 mg daily. Plan to start Spravato  for TRD asap   09/27/2021 appointment with the following noted:  She has started Spravato  today at 54 mg intranasally.  She tolerated it well without unusual nausea or vomiting headache or other somatic symptoms.  She did have the expected dissociation which gradually  resolved over the course of the 2-hour period of observation.  She was a little concerned about her balance given her cerebral palsy but has not noted unusual or unexpected problems.  She is motivated to can continue Spravato  in hopes of reducing her depressive symptoms. 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  She also has treatment resistant OCD which is partially managed with medications but is still quite disabling.  She is tolerating the medications well.  She is sleeping adequately.  Her appetite is adequate.  She is not having suicidal thoughts.  She continues to wish for a better treatment for OCD that would give her some relief.  09/30/2021 appointment with the following noted: She received her first dose of Spravato  84 mg intranasally today.  She tolerated it well without unusual nausea, vomiting, or other somatic symptoms.  Dissociation as expected did occur and gradually resolved over the 2-hour period of observation.  She did have a mild headache today with the treatment and received ibuprofen 600 mg at her request.  We will follow this to see if it is a pattern Patient is still depressed.  She said she was late with her medicine today and today was a particularly depressing day.  However she notes that the Spravato  has lifted her mood considerably even today.  She is hopeful that it will continue to be helpful.  No suicidal thoughts.  She has ongoing chronic anxiety and OCD at baseline.  10/04/21 appt noted: Patient received Spravato  84 mg for the second time today.  She tolerated it well without any unusual headache, nausea or vomiting or other somatic symptoms.  Dissociation did occur and she gradually [place] resolution over the 2-hour period of observation. She did not have any unusual problems after she left the office last Spravato  administration.  She did not have any specific problems with balance or walking.  She is at increased risk of that  difficulty because of cerebral palsy.  So far she has not noticed much mood effect from the medication beyond the first day of receiving it.  However she would like to continue Spravato  in hopes of getting the antidepressant effect that is desired. Stress dealing with mother's behavior at party pt hosted.  Guilt over it.  10/07/2021 appointment noted: Patient received Spravato  84 mg for the second time today.  She tolerated it well without any unusual headache, nausea or vomiting or other somatic symptoms.  Dissociation did occur and she gradually [place] resolution over the 2-hour period of observation. She still is not sure about the antidepressant effect of Spravato .  Events over the holidays and demands, make it difficult to assess.  She still notes that the OCD tends to worsen the depression and vice versa.  She tolerates the Spravato  well and wants to continue the trial.  10/15/2021 appointment with the following noted: Patient received Spravato  84 mg for the second time today.  She tolerated it well without any unusual headache, nausea or vomiting or other somatic symptoms.  Dissociation did occur and she gradually [place] resolution over the 2-hour period of observation. Patient says it was somewhat difficult to evaluate the effect of the Spravato .  It was scheduled to be twice weekly for 4 weeks consecutively but the holidays have interfered with that administration.  She asked what specifically should be she should be looking for in order to assess improvement.  That was discussed.  The OCD is unchanged and the depression so far is not significantly different.  She still tolerates meds.  There have been no recent med changes  10/19/2021 appt noted: Patient received Spravato  84 mg for the second today.  She tolerated it well without any unusual headache, nausea or vomiting or other somatic symptoms.  Dissociation did occur and she gradually saw resolution over the 2-hour period of observation.    10/21/2021 appointment noted: Patient received Spravato  84 mg today.  She tolerated it well without any unusual headache, nausea or vomiting or other somatic symptoms.  Dissociation did occur and she gradually saw resolution over the 2-hour period of observation.  She feels better than last week.  She is not as depressed and down.  She is still dealing with grief around the death of her cousin that was unexpected.  It is still difficult to tell how much the Spravato  was doing but she is hopeful.  Anxiety is still present with the OCD.  She is not having suicidal thoughts.  She is not hopeless.  She wants to continue treatment.  10/25/2021 appointment with the following noted: Patient received Spravato  84 mg today.  She tolerated it well without any unusual headache, nausea or vomiting or other somatic symptoms.  Dissociation did occur and she gradually saw resolution over the 2-hour period of observation.  She does not typically find the dissociation very strong. She is beginning to think the Spravato  is helping somewhat with the depression.  It has been difficult to tell with the holidays intervening as well as the death of her cousin.  She has not been able to get Spravato  twice weekly for 4 weeks straight as typically planned.  However she is hopeful.  The OCD remains significant.  She still has a tendency to think very negatively.  She is not suicidal.  10/28/2021 appointment with the following noted: Patient received Spravato  84 mg today.  She tolerated it well without any unusual headache, nausea or vomiting or other somatic symptoms.  Dissociation did occur and she gradually saw resolution over the 2-hour period of observation.  She does not typically find the dissociation very strong. She is feeling more hopeful about the administration of Spravato .  She is having less depression she believes.  Still not dramatically different.  She still has a tendency to have a lot of anxiety and rumination and  OCD.  She is not suicidal.  She is eager to continue the Spravato .  11/01/2021 appointment with the following noted: Patient received Spravato  84 mg today.  She tolerated it well without any unusual headache, nausea or vomiting or other somatic symptoms.  Dissociation did occur and she gradually saw resolution over the 2-hour period of observation.  She does not typically find the dissociation very strong. She is continuing to see a little bit of  improvement in depression with Spravato .  The anxiety remains but may be not as severe.  The OCD remains markedly severe chronically.  She is not suicidal.  She is encouraged by the degree of improvement with Spravato  and inability to enjoy things more and not be quite as ruminative.  11/04/2021 appt noted: Patient received Spravato  84 mg today.  She tolerated it well without any unusual headache, nausea or vomiting or other somatic symptoms.  Dissociation did occur and she gradually saw resolution over the 2-hour period of observation.  She does not typically find the dissociation very strong. No SE complaints with meds. She continues to feel hopeful about the Spravato .  She has less depression.  Because of a number of factors she is uncertain of the full benefit but thinks she is somewhat less depressed.  Her anxiety and OCD remain significant but a little better.  She is tolerating the medications and does not desire medicine change.  She is not currently complaining of insomnia.   11/08/2021 appointment the following noted: Patient received Spravato  84 mg today.  She tolerated it well without any unusual headache, nausea or vomiting or other somatic symptoms.  Dissociation did occur and she gradually saw resolution over the 2-hour period of observation.  She does not typically find the dissociation very strong. No SE complaints with meds. She feels the Spravato  is helping somewhat.  She would like to see a greater effect.  However she is able to enjoy things.   She is productive at home.  She would like to see a lifting of a degree of sadness that remains.  The anxiety and OCD remained largely unchanged.  She wondered about the dosing of Wellbutrin  300 mg a day and Luvox  300 mg a day and possible increases.  She has been at higher doses in the past.  She plans to start water therapy for her weakness and for her shoulder.  11/11/2021 appointment with the following noted: Patient received Spravato  84 mg today.  She tolerated it well without any unusual headache, nausea or vomiting or other somatic symptoms.  Dissociation did occur and she gradually saw resolution over the 2-hour period of observation.  She does not typically find the dissociation very strong. No SE complaints with meds. She feels the Spravato  is clearly helping the depression.  She would like to see a more significant effect.  She is still having trouble thinking positive. Her energy is fair.  Concentration is good except for the problem with chronic obsessions. She has been taking Wellbutrin  300 mg in Luvox  300 mg for quite some time but has taken higher doses in the past.  We discussed that.  She would like to try higher doses in order to get a better effect if possible. We just increased the doses a couple of days ago.  No effect yet.  11/15/2021 appointment with the following noted: Patient received Spravato  84 mg today.  She tolerated it well without any unusual headache, nausea or vomiting or other somatic symptoms.  Dissociation did occur and she gradually saw resolution over the 2-hour period of observation.  She does not typically find the dissociation very strong. No SE complaints with meds. The patient is now convinced that the Spravato  is helping the depression.  She would like to continue twice weekly Spravato  this week if possible.  She has tolerated the increase in Wellbutrin  to 450 mg daily and the increase and fluvoxamine  to 400 mg daily without complications thus far.  The OCD  and anxiety feed the depression to some extent. She spends approximately 2 hours daily with checking compulsions due to obsessions about causing harm to others.  For example fearing that when she has hit a pot hole that she may have hit a person and going back to check.  Checking corners and rooms out of fear that she may have harmed someone.  Other various checking compulsions.  She is hoping the increase in fluvoxamine  to 400 mg will reduce that over the weeks to come.  She is not seeing a significant difference with the addition of the Spravato  though she understands that was not expected.  She is more productive at home and more motivated and able to enjoy things more fully as a result of the Spravato  treatment.  She is tolerating the medication  11/18/2021 appointment with the following noted: Patient received Spravato  84 mg today.  She tolerated it well without any unusual headache, nausea or vomiting or other somatic symptoms.  Dissociation did occur and she gradually saw resolution over the 2-hour period of observation.  She does not typically find the dissociation very strong. No SE complaints with meds. She clearly believes the Spravato  has been helpful for the depression.  She wonders whether to continue to treatments weekly or to cut back to 1 weekly.  She would like to continue twice weekly in hopes of getting additional improvement in the depression because it is not resolved but it is difficult to get here twice a week in terms of arranging rides. She is recently increased Wellbutrin  XL to 450 mg daily and fluvoxamine  to 400 mg daily but they have not had time to have an official effect.  She is tolerating that well.  She is tolerating meds overwork overall well. The OCD remains the same as noted on 11/15/2021  11/25/21 appt noted: Patient received Spravato  84 mg today.  She tolerated it well without any unusual headache, nausea or vomiting or other somatic symptoms.  Dissociation did occur and  she gradually saw resolution over the 2-hour period of observation.  She does not typically find the dissociation very strong. No SE complaints with meds. She thinks the increase in Wellbutrin  and Luvox  have been potentially helpful for depression and OCD respectively.  It has been too early to see the full effect.  She is sleeping and eating well.  She is functioning at home.  She still spends a lot of time that is about 2 hours a day dealing with compulsive behaviors.  12/02/21 appt noted: Patient received Spravato  84 mg today.  She tolerated it well without any unusual headache, nausea or vomiting or other somatic symptoms.  Dissociation did occur and she gradually saw resolution over the 2-hour period of observation.  She does not typically find the dissociation very strong. No SE complaints with meds. Several losses and stressors recently that affect her sense of mood. However still sees significant benefit from the Spravato  for her depression.  Wants to continue it. Suspect early  some benefit from the increased Wellbutrin  for depression and Luvox  for OCD. Tolerating meds. No complaints about the meds. Sleeping and eating well.  No new health concerns.  12/09/21 appt noted: Patient received Spravato  84 mg today.  She tolerated it well without any unusual headache, nausea or vomiting or other somatic symptoms.  Dissociation did occur and she gradually saw resolution over the 2-hour period of observation.  She does not typically find the dissociation very strong. No SE complaints with meds. Seeing noticeable  improvement from increase fluvoxamine  to 400 mg daily.  Tolerating meds without concerns over them. Depression is stable with residual sx of easy guilt and easily stressed.  OCD contributes to depression but depression is not severe with less crying spells.  Productive at home with chores.  Enjoyed recent birthday.  Sleeping good. No new concerns.  12/23/2021 appointment noted: Patient  received Spravato  84 mg today.  She tolerated it well without any unusual headache, nausea or vomiting or other somatic symptoms.  Dissociation did occur and she gradually saw resolution over the 2-hour period of observation.  She does not typically find the dissociation very strong. No SE complaints with meds. Seeing noticeable improvement from increase fluvoxamine  to 400 mg daily.  Tolerating meds without concerns over them. Her depression is somewhat improved with the Spravato .  She also feels generally a little lighter.  She is more motivated.  She is less overwhelmed by guilt.  The OCD is gradually improving but is still quite time-consuming as noted before.  She is sleeping well.  No side effects  12/30/2021 appointment with the following noted: Patient received Spravato  84 mg today.  She tolerated it well without any unusual headache, nausea or vomiting or other somatic symptoms.  Dissociation did occur and she gradually saw resolution over the 2-hour period of observation.  She does not typically find the dissociation very strong. No SE complaints with meds. Seeing noticeable improvement from increase fluvoxamine  to 400 mg daily.  Tolerating meds without concerns over them. She is confident of her the improvement seen with Spravato .  She is less hopeless.  Guilt is marked remarkably improved.  She is not having any thoughts of death or dying.  She is more motivated for activities such as exercise which she is recently started.  She is sleeping well. The OCD remains severe but it is improving somewhat with the increase in fluvoxamine .  It is still consuming a couple hours per day.  01/10/22 apravato 84 admin  01/24/22 appt noted: Patient received Spravato  84 mg today.  She tolerated it well without any unusual headache, nausea or vomiting or other somatic symptoms.  Dissociation did occur and she gradually saw resolution over the 2-hour period of observation.  She does not typically find the  dissociation very strong. No SE complaints with meds. Very tearful today.  Feels like she has been suppressing emotion in the Spravato  caused it to be released.  Discussed some stressors.  Overall still feels the medicine is helpful.  She has missed some of the scheduled Spravato  treatments that were intended to be weekly due to circumstances beyond her control.  She is still struggling with OCD as previously noted but does believe the medications are helpful. Plan no med changes  01/31/2022 received Spravato  84 mg today  02/09/2022 appointment with the following noted: Patient received Spravato  84 mg today.  She tolerated it well without any unusual headache, nausea or vomiting or other somatic symptoms.  Dissociation did occur and she gradually saw resolution over the 2-hour period of observation.  She does not typically find the dissociation very strong. No SE complaints with meds. Spravato  clearly helps depression and OCD but easily gets overwhelmed and tearful with fairly routine stressors.  Tolerating meds. Sleep and appetite is OK Asks to increase lorazepam  to 2 mg AM and HS and 1mg  afternoon  02/16/22 appt noted: Patient received Spravato  84 mg today.  She tolerated it well without any unusual headache, nausea or vomiting or other somatic symptoms.  Dissociation did occur and she gradually saw resolution over the 2-hour period of observation.  She does not typically find the dissociation very strong. No SE complaints with meds. She has chronic depesssion and OCD but is improved with Spravato , both dx versus before.  She has continued Luvox  400 mg and Wellbutrin  450 mg and is tolerating it.  Chronically easily stressed.  Tolerating all meds.  Doesn't like taking more meds.  Spending a couple hours daily with OCD.  No SI No med changes.  02/21/22 appt noted:   Doesn't like taking more meds.  Spending a couple hours daily with OCD.  No SI No med changes.  02/21/22 appt noted: Patient received  Spravato  84 mg today.  She tolerated it well without any unusual headache, nausea or vomiting or other somatic symptoms.  Dissociation did occur and she gradually saw resolution over the 2-hour period of observation.  She does not typically find the dissociation very strong. No SE complaints with meds. She has chronic depesssion and OCD but is improved with Spravato , both dx versus before.  She has continued Luvox  400 mg and Wellbutrin  450 mg and is tolerating it.  Chronically easily overwhelmed and doesn't know why.  Tolerating all meds. Wants to continue meds.  03/16/22 appt noted: Patient received Spravato  84 mg today.  She tolerated it well without any unusual headache, nausea or vomiting or other somatic symptoms.  Dissociation did occur and she gradually saw resolution over the 2-hour period of observation.  She does not typically find the dissociation very strong. No SE complaints with meds. Overall she still feels the Spravato  has been helpful not only for her depression but also for her OCD which was somewhat unexpected.  OCD is still significant but it is less severe than prior to starting Spravato .  She is tolerating Luvox  400 mg and Wellbutrin  450 mg.  We discussed possible med adjustments.  03/23/22 appt noted: Patient received Spravato  84 mg today.  She tolerated it well without any unusual headache, nausea or vomiting or other somatic symptoms.  Dissociation did occur and she gradually saw resolution over the 2-hour period of observation.  She does not typically find the dissociation very strong. No SE complaints with meds. She is still depressed and still has OCD of course but is improved with the Spravato .  She is tolerating the medications well.  We had previously discussed the possibility of switching some of the Wellbutrin  to Auvelity  and she is very interested in that in hopes of further improvement in depression and OCD.  She understands that Auvelity  is not used for OCD on the label.   She is tolerating the medications.  She is still easily overwhelmed.  She is sleeping and eating okay.. Plan: Reduce Wellbutrin  XL to 300 mg AM and add Auvelity  1 tablet each AM  03/30/22 appt noted: Patient received Spravato  84 mg today.  She tolerated it well without any unusual headache, nausea or vomiting or other somatic symptoms.  Dissociation did occur and she gradually saw resolution over the 2-hour period of observation.  She does not typically find the dissociation very strong. No SE complaints with meds. She is still depressed and still has OCD of course but is improved with the Spravato .  She is tolerating the medications well.  No difference with Auvelity  1 AM so far and no SE.  Going on vacation on Saturday. 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 Sleep and appetite good. Plan: Increase Auvelity  to 1 twice daily and  reduce Wellbutrin  to XL 150 every morning  04/14/2022 appointment with the following noted: Patient received Spravato  84 mg today.  She tolerated it well without any unusual headache, nausea or vomiting or other somatic symptoms.  Dissociation did occur and she gradually saw resolution over the 2-hour period of observation.  She does not typically find the dissociation very strong. No SE complaints with meds. She is still depressed and still has OCD of course but is improved with the Spravato .  She is tolerating the medications well.  Just increased Auvelity  to BID yesterday and reduced Wellbutrin  to 150 AM. No SE so far.  No change in mood or anxiety so far.  Chronic OCD as noted and residual depression and chronic fatigue.  04/21/2022 appointment with the following noted: Patient received Spravato  84 mg today.  She tolerated it well without any unusual headache, nausea or vomiting or other somatic symptoms.  Dissociation did occur and she gradually saw resolution over the 2-hour period of observation.  She does not typically find the dissociation very strong. No  SE complaints with meds. She is still depressed and still has OCD of course but is improved with the Spravato .   She has questions about the dosing of lorazepam . She tends to have negative anxious thoughts at night.  This tends to interfere with her ability to go to sleep.  She is getting about 8 to 9 hours of sleep.  She is tolerating the meds without excessive sedation and does not nap during the day.  05/06/22 appt noted: Patient received Spravato  84 mg today.  She tolerated it well without any unusual headache, nausea or vomiting or other somatic symptoms.  Dissociation did occur and she gradually saw resolution over the 2-hour period of observation.  She does not typically find the dissociation very strong. No SE complaints with meds. She is still depressed and still has OCD of course but is improved with the Spravato .   Had some questions about timing of dosing of fluvoxamine  and Auvelity . OCD is not quite as time consuming.  Sleep and eating are the same.   No SE meds.  05/25/22 appt noted: Patient received Spravato  84 mg today.  She tolerated it well without any unusual headache, nausea or vomiting or other somatic symptoms.  Dissociation did occur and she gradually saw resolution over the 2-hour period of observation.  She does not typically find the dissociation very strong. No SE complaints with meds. She is still depressed and still has OCD of course but is improved with the Spravato .   She has less OCD when away from home and on vacation of note. Plan: Rec gradually reduce HS lorazepam  to 1 mg Hs.  Can continue lorazepam  2 mg AM and 1 mg in afternoon bc of chronic anxiety and it is helpful and tolerated. She can continue temazepam  30 mg nightly.  She tends to have a lot of anxious negative thoughts at night when she is trying to go to bed  06/16/22 appt noted: Patient received Spravato  84 mg today.  She tolerated it well without any unusual headache, nausea or vomiting or other somatic  symptoms.  Dissociation did occur and she gradually saw resolution over the 2-hour period of observation.  She does not typically find the dissociation very strong. No SE complaints with meds. She is still depressed and still has OCD of course but is improved with the Spravato .   She has less OCD when away from home and on vacation of note. She  is tolerating the medications.  She has continued current medications. 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  She still receives benefit from Spravato .  Her depression and OCD are better with the Spravato .  She thinks she is getting additional benefit with the switch from Wellbutrin  to Auvelity .  07/04/2022 appointment noted: Reports she developed a rash on her face from Auvelity  and feels like she is allergic to it.  She stopped it and went back to Wellbutrin  450 mg every morning.  The rash has cleared up.  She did not require any medical attention and did not have shortness of breath. Overall her depression and OCD are about the same as they have been.  She did not notice a substantial difference from the brief treatment with Auvelity  but she understands she did not take a full course.  She is tolerating the current medicines well. Current meds fluvoxamine  400 mg daily, Wellbutrin  XL 450 mg daily, lorazepam  1 to 2 mg in the morning and 1 to 2 mg at night and 1 mg in the afternoon, temazepam  30 mg nightly. She wants to continue the Spravato  because she feels it has been helpful for both her depression and her racing OCD  07/18/22 appt noted: Patient received Spravato  84 mg today.  She tolerated it well without any unusual headache, nausea or vomiting or other somatic  symptoms.  Dissociation did occur and she gradually saw resolution over the 2-hour period of observation.  She does not typically find the dissociation very strong. No SE complaints with meds. She is still depressed and still has OCD of course but is improved with the Spravato .  Rash better off Auvelity  and back on Welllbutrin XL 450 mg AM, fluvoxamine  400 mg daily.  08/15/22 appt noted: Current psych meds: Wellbutrin  XL 450 mg AM, fluvoxamine  100 mg in AM and 300 mg HS, lorazepam  1 mg 1-2 mg in the AM and HS and 1 tablet prn midday for anxiety, temazepam  30 mg HS Patient received Spravato  84 mg today.  She tolerated it well without any unusual headache, nausea or vomiting or other somatic symptoms.  Dissociation did occur and she gradually saw resolution over the 2-hour period of observation.  She does not typically find the dissociation very strong. No SE complaints with meds. She has a great deal of stress dealing with her family.  Disc brother's ongoing mania and difficulty getting him help and the stress he causes for the family. She wants to continue Spravato  through this very stressful holdicay season and reevaluate the frequency after the New Year.  09/12/22 appt noted: Current psych meds: Wellbutrin  XL 450 mg AM, fluvoxamine  100 mg in AM and 300 mg HS, lorazepam  1 mg 1-2 mg in the AM and HS and 1 tablet prn midday for anxiety, temazepam  30 mg HS Patient received Spravato  84 mg today.  She tolerated it well without any unusual headache, nausea or vomiting or other somatic symptoms.  Dissociation did occur and she gradually saw resolution over the 2-hour period of observation.  She does not typically find the dissociation very strong. No SE complaints with meds. She has a great deal of stress dealing with her family.  Disc brother's ongoing mania and difficulty getting him help and the stress he causes for the family. She wants to continue Spravato  through this very stressful holdicay season  and reevaluate the frequency after the New Year.  Chronically easily overwhelmed with family. Complaining of HA and history migraine.  Asks for increase imitrex  and disc preventatives like propranolol  ER  09/26/22 appt noted: Current psych meds: Wellbutrin  XL 450 mg AM, fluvoxamine  100 mg in AM and 300 mg HS, lorazepam  1 mg 1-2 mg in the AM and HS and 1 tablet prn midday for anxiety, temazepam  30 mg HS Patient received Spravato  84 mg today.  She tolerated it well without any unusual headache, nausea or vomiting or other somatic symptoms.  Dissociation did occur and she gradually saw resolution over the 2-hour period of observation.  She does not typically find the dissociation very strong. No SE complaints with meds. She has a great deal of stress dealing with her family.  This is ongoing The holidays are much more stressful DT family problems.  She is noting OCD is much worse over the last couple of week.  Depression is better with Spravato . Needed higher dose meds for migraine.   10/03/22 appt noted: Current psych meds: Wellbutrin  XL 450 mg AM, fluvoxamine  100 mg in AM and 300 mg HS, lorazepam  1 mg 1-2 mg in the AM and HS and 1 tablet prn midday for anxiety, temazepam  30 mg HS Patient received Spravato  84 mg today.  She tolerated it well without any unusual headache, nausea or vomiting or other somatic symptoms.  Dissociation did occur and she gradually saw resolution over the 2-hour period of observation.  She does not typically find the dissociation very strong. No SE complaints with meds. She has now realized that the rash she previously previously attributed to Auvelity  was not related.  She is interested may be retrying that after the holidays.  She is tolerating medications otherwise. The holidays remain chronically stressful to her due to family dynamic problems which cause her to consistently feel stuck.  Under more stress her OCD is worse.  She will have a tendency to have crying spells.   The depression and OCD are still improved with Spravato  as compared to before.  11/15/22 appt noted: Current psych meds: Wellbutrin  XL 450 mg AM, fluvoxamine  100 mg in AM and 300 mg HS, lorazepam  1 mg 1-2 mg in the AM and HS and 1 tablet prn midday for anxiety, temazepam  30 mg HS Patient received Spravato  84 mg today.  She tolerated it well without any unusual headache, nausea or vomiting or other somatic symptoms.  Dissociation did occur and she gradually saw resolution over the 2-hour period of observation.  She does not typically find the dissociation very strong. No SE complaints with meds. Continues to feel depressed and overwhelmed by family problems including her brother's mania and recent eviction and commitment.  Chronic OCD worse when stressed.  No SI.  Tolerating meds. Plan: Per her request continue Wellbutrin  XL 450 mg every morning. She has come to the realization that the rash she had previously attributed to Auvelity  was not related.  She is interested in perhaps retrying Auvelity . There are few alternative medication options that remain.  01/11/23 appt noted: Current psych meds: Wellbutrin  XL 150 mg BID and started Auvelity  1 AM, fluvoxamine  100 mg in AM and 300 mg HS, lorazepam  1 mg 1-2 mg in the AM and HS and 1 tablet prn midday for anxiety, temazepam  30 mg HS  Patient received Spravato  84 mg today.  She tolerated it well without any unusual headache, nausea or vomiting or other somatic symptoms.  Dissociation did occur and she gradually saw resolution over the 2-hour period of observation.  She does not typically find the dissociation very strong. No SE complaints with meds. Trouble getting to sessions lately DT transportation problems.   Continues to feel depressed and overwhelmed by OCD and family.  Feels she needs toevery other week Spravato  bc it helps for a couple of weeks and then seems to wear off.  Struggleing with OCD and depression both of which are eased by Spravato . Less  crying with Auvelity .  02/07/23 appt noted: Current psych meds: Wellbutrin  XL 150 mg BID and started Auvelity  1 AM, fluvoxamine  100 mg in AM and 300 mg HS, lorazepam  1 mg 1-2 mg in the AM and HS and 1 tablet prn midday for anxiety, temazepam  30 mg HS Patient received Spravato  84 mg today.  She tolerated it well without any unusual headache, nausea or vomiting or other somatic symptoms.  Dissociation did occur and she gradually saw resolution over the 2-hour period of observation.  She does not typically find the dissociation very strong. No SE complaints with meds. Trouble getting to sessions lately DT transportation problems.  This is a problem ongoing and thinks she might need to pause Spravato  bc won't be able to get her for at least 3 weeks. She is holding pretty steady with a moderate level of anxiety and depression ongoing and chronic.    03/20/23 appt noted: Current psych meds: Wellbutrin  XL 150 mg BID and started Auvelity  1 AM, fluvoxamine  100 mg in AM and 300 mg HS, lorazepam  1 mg 1-2 mg in the AM and HS and 1 tablet prn midday for anxiety, temazepam  30 mg HS Patient received Spravato  84 mg today.  She tolerated it well without any unusual headache, nausea or vomiting or other somatic symptoms.  Dissociation did occur and she gradually saw resolution over the 2-hour period of observation.  She does not typically find the dissociation very strong. No SE complaints with meds. She is trying to get back into more regular Spravato  administration.  Family issues and transportation problems that led to her missing Spravato .  She feels more depressed without regular Spravato .  Her OCD is chronic but also worse when she misses Spravato .  She does not want any medication changes.  04/03/23 appt noted: Current psych meds: Wellbutrin  XL 150 mg BID and started Auvelity  1 AM, fluvoxamine  100 mg in AM and 300 mg HS, lorazepam  1 mg 1-2 mg in the AM and HS and 1 tablet prn midday for anxiety, temazepam  30 mg  HS Patient received Spravato  84 mg today.  She tolerated it well without any unusual headache, nausea or vomiting or other somatic symptoms.  Dissociation did occur and she gradually saw resolution over the 2-hour period of observation.  She does not typically find the dissociation very strong.  It gets rid of negative emotion for awhile after procedure and would like it to last longer.   No SE complaints with meds.  Doesn't really want med changes.   Planning to weekly Spravato .  It is helping dep and anxiety.    04/10/23 appt noted: Current psych meds: Wellbutrin  XL 150 mg BID and started Auvelity  1 AM, fluvoxamine  100 mg in AM and 300 mg HS, lorazepam  1 mg 1-2 mg in the AM and HS and 1 tablet prn midday for anxiety, temazepam  30 mg  HS Patient received Spravato  84 mg today.  She tolerated it well without any unusual headache, nausea or vomiting or other somatic symptoms.  Dissociation did occur and she gradually saw resolution over the 2-hour period of observation.  She does not typically find the dissociation very strong.  It gets rid of negative emotion for awhile after procedure and would like it to last longer.   No SE complaints with meds.  Doesn't really want med changes.   Had lidocaine  shot for back pain with brief benefit.  Doing some water based PT Spravato  went well today. Got pretty upset last week with event related to son's activities.  Got upset with son saying something inappropriate publicly.  Was so embarrassed.  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    He's kind of rebellious lately.  Son is 74 yo.    05/03/23 appt noted: Current psych meds: Wellbutrin  XL 150 mg BID and started Auvelity  1 AM, fluvoxamine  100 mg in AM and 300 mg HS, lorazepam  1 mg 1-2 mg in the AM and HS and 1 tablet prn midday for anxiety, temazepam  30 mg HS No SE Patient received Spravato  84 mg today.  She tolerated it well without any unusual headache, nausea or  vomiting or other somatic symptoms.  Dissociation did occur and she gradually saw resolution over the 2-hour period of observation.  She does not typically find the dissociation very strong.  It gets rid of negative emotion for awhile after procedure and would like it to last longer.   Still pleased with benefit from Spravato  for mood and anxiety. No SE complaints with meds.  Doesn't really want med changes.   Chronic family px ongoing affects her but nothing she can change.H sick of the family drama.   Continuing counseling helps.  Has some support. Mood and anxiety pretty steady but did go on vacation to [place].  Anxiety worse first in AM and then later at night with mind racing on stressors.   Still back trouble.  05/23/23 appt noted: Current psych meds: Wellbutrin  XL 150 mg BID and started Auvelity  1 AM, fluvoxamine  100 mg in AM and 300 mg HS, lorazepam  1 mg 1-2 mg in the AM and HS and 1 tablet prn midday for anxiety, temazepam  30 mg HS No SE Patient received Spravato  84 mg today.  She tolerated it well without any unusual headache, nausea or vomiting or other somatic symptoms.  Dissociation did occur and she gradually saw resolution over the 2-hour period of observation.  She does not typically find the dissociation very strong.  It gets rid of negative emotion for awhile after procedure and would like it to last longer.   Still pleased with benefit from Spravato  for mood and anxiety by 50%. No SE complaints with meds. Chronic family stress interferes with mental health and self care.  May have to reduce frequency of Spravato  bc of this. But is status quo with meds.  Chronic residual OCD which is mod severe and dep moderate.  05/30/23 appt noted: Current psych meds: Wellbutrin  XL 150 mg BID and started Auvelity  1 AM, fluvoxamine  100 mg in AM and 300 mg HS, lorazepam  1 mg 1-2 mg in the AM and HS and 1 tablet prn midday for anxiety, temazepam  30 mg HS No SE Patient received Spravato  84 mg  today.  She tolerated it well without any unusual headache, nausea or vomiting or other somatic symptoms.  Dissociation did occur and she gradually saw resolution over the 2-hour period of observation.  She does not typically find the dissociation very strong.  It gets rid of negative emotion for awhile after procedure and would like it to last longer.   Still pleased with benefit from Spravato  for mood and anxiety by 50% or better but not resolved. She wants to continue Spravato  as frequently as schedule will allow. Both depression and OCD are better with most improvement in mood.  Asks about any new treatments for OCD. No SE complaints with meds. Chronic family stress interferes with mental health and self care.  This is an ongoing drain on her mood and resources emotionally.    06/06/23 appt noted: Current psych meds: Wellbutrin  XL 150 mg BID and started Auvelity  1 AM, fluvoxamine  100 mg in AM and 300 mg HS, lorazepam  1 mg 1-2 mg in the AM and HS and 1 tablet prn midday for anxiety, temazepam  30 mg HS No SE Patient received Spravato  84 mg today.  She tolerated it well without any unusual headache, nausea or vomiting or other somatic symptoms.  Dissociation did occur and she gradually saw resolution over the 2-hour period of observation.  She does not typically find the dissociation very strong.  It gets rid of negative emotion for awhile after procedure and would like it to last longer.   Still believes each meds work and are helpful and well tolerated.  Specifically she thinks that the Auvelity  adds additional benefit on taking Wellbutrin .  She believes the meds help both depression and OCD though the OCD remains a problem.  She also believes Spravato  helps both types of symptoms as well.  Her mood is variable.  She experiences a great deal of stress from her family and those problems wax and wane.  She has bad days because of it and today is 1 of those days.  She is continuing counseling and that is  helpful.  Due to family demands she may have to spread out the Spravato  frequency.  06/16/23 appt noted:  Current psych meds: Wellbutrin  XL 150 mg BID and started Auvelity  1 AM, fluvoxamine  100 mg in AM and 300 mg HS, lorazepam  1 mg 1-2 mg in the AM and HS and 1 tablet prn midday for anxiety, temazepam  30 mg HS No SE Patient received Spravato  84 mg today.  She tolerated it well without any unusual headache, nausea or vomiting or other somatic symptoms.  Dissociation did occur and she gradually saw resolution over the 2-hour period of observation.  She does not typically find the dissociation very strong.  It gets rid of negative emotion for awhile after procedure . She is trying to make the Schedule work for Spravto oh every week or every other week because she is aware the treatment effects can be lost if it is time less frequently.  So she has been able to come the last 2 weeks, we will try to come in 2 weeks.  Spravato  reduces depression and OCD significantly though she remains highly symptomatic with both.  However she has no suicidal thoughts.  Crying spells are reduced with Spravato .  She still spends a fair amount of time meaning over an hour a day checking and more under stress or when tired.  She asks about any new meds for OCD. Plan: increase Auvelity  1 in AM & PM Hold Wellbutrin  XL 150 mg BID  07/12/23 appt noted: Current psych meds: Wellbutrin  XL 150 mg BID and started Auvelity   1 AM, fluvoxamine  100 mg in AM and 300 mg HS, lorazepam  1 mg 1-2 mg in the AM and HS and 1 tablet prn midday for anxiety, temazepam  30 mg HS.  Didn't increase Auvelity  yet No SE Patient received Spravato  84 mg today.  She tolerated it well without any unusual headache, nausea or vomiting or other somatic symptoms.  Dissociation did occur and she gradually saw resolution over the 2-hour period of observation.  She does not typically find the dissociation very strong.  It gets rid of negative emotion for awhile after  procedure . She has not increased the Auvelity  yet is planned.  She has a little bit of nervousness about it but admits is not rational.  She is willing to give that a try to try to get better control of depression and anxiety.  She continues to see the benefit of Spravato  every other week.  She is struggling with some pain due to plantar fasciitis and given her cerebral palsy that makes it even more difficult to walk and to engage in normal activities.  She is willing to seek treatment for that.  07/25/23 appt noted: Current psych meds: Wellbutrin  XL 150 mg AM and started Auvelity  1 BID, fluvoxamine  100 mg in AM and 300 mg HS, lorazepam  1 mg 1-2 mg in the AM and HS and 1 tablet prn midday for anxiety, temazepam  30 mg HS.  Didn't increase Auvelity  yet No SE Patient received Spravato  84 mg today.  She tolerated it well without any unusual headache, nausea or vomiting or other somatic symptoms.  Dissociation did occur and she gradually saw resolution over the 2-hour period of observation.  She does not typically find the dissociation very strong.  It gets rid of negative emotion for awhile after procedure . Chronic dep and anxiety to some extent.  But has increased Auvelity  to BID recently and no SE yet.  Disc frequency Spravato  bc benefit and will try to continue every other week and not less if possible. No further concerns for meds.  08/08/23 appt noted: Current psych meds: Wellbutrin  XL 150 mg AM and Auvelity  1 BID, fluvoxamine  100 mg in AM and 300 mg HS, lorazepam  1 mg 1-2 mg in the AM and HS and 1 tablet prn midday for anxiety, temazepam  30 mg HS.  Didn't increase Auvelity  yet No SE with med changes. Patient received Spravato  84 mg today.  She tolerated it well without any unusual headache, nausea or vomiting or other somatic symptoms.  Dissociation did occur and she gradually saw resolution over the 2-hour period of observation.  She does not typically find the dissociation very strong.  It gets rid  of negative emotion for awhile after procedure . Mornings still worse with dep and anxiety.  But mood and anxiety are better with the increase in Auvelity  to BID.  OCD seems a little better.  Chronic difficulty handling the complaints and stress from her family.  Easily stresser her out.  No med changes desire. She will have to take a break from Spravato  DT pending foot surgery.  Mobility and transportation will be limited.  08/25/23 appt noted: Current psych meds: Wellbutrin  XL 150 mg AM and Auvelity  1 BID, fluvoxamine  100 mg in AM and 300 mg HS, lorazepam  1 mg 1-2 mg in the AM and HS and 1 tablet prn midday for anxiety, temazepam  30 mg HS.  Didn't increase Auvelity  yet No SE with med changes. Patient received Spravato  84 mg today.  She tolerated it well  without any unusual headache, nausea or vomiting or other somatic symptoms.  Dissociation did occur and she gradually saw resolution over the 2-hour period of observation.  She does not typically find the dissociation very strong.  It gets rid of negative emotion for awhile after procedure . Pending foot surgery next week.  Able to work in [employer]  this week which helps with dep and anxiety and OCD.  Struggles with sleep without multiple meds as noted.  No adverse effects.  Will return to [employer]  asap after foot surgery  10/04/23 appt noted: Current psych meds: Wellbutrin  XL 150 mg AM and Auvelity  1 BID, fluvoxamine  100 mg in AM and 300 mg HS, lorazepam  1 mg 1-2 mg in the AM and HS and 1 tablet prn midday for anxiety, temazepam  30 mg HS.  Didn't increase Auvelity  yet No SE with med changes. Patient received Spravato  84 mg today.  She tolerated it well without any unusual headache, nausea or vomiting or other somatic symptoms.  Dissociation did occur and she gradually saw resolution over the 2-hour period of observation.  She does not typically find the dissociation very strong.  It gets rid of negative emotion for awhile after procedure . Mood and  anxiety stable.  Benefit with meds and [employer] .   She reports OCD time consumption typically is much better than in the past when it was routinely 2 hours daily.  Now less than have of that.    Chronic caregiver stress with dysfunctional family and has trouble with boundaries.  Working on that in therapy.  10/24/23 appt noted: Current psych meds: Wellbutrin  XL 150 mg AM and Auvelity  1 BID, fluvoxamine  100 mg in AM and 300 mg HS, lorazepam  1 mg 1-2 mg in the AM and HS and 1 tablet prn midday for anxiety, temazepam  30 mg HS.  No SE with med changes. She plans to stop Sp[employer]  bc couldn't get adequate transportation.  Her mood is continuing to be highly reactive with what's going on with family esp mother and brother.  Mother self absorbed and can be mean yet demanding.   She doesn't fear getting worse if she stops Sp[employer]  though this has happened in the past.   Lately less dep though can be pulled down quickly by family of origin interactions.  Narcissistic mother.  Thinking of PT work to try to keep her mind more occupied. Plan no changes  01/15/24 appt noted: Current psych meds: Wellbutrin  XL 150 mg AM and Auvelity  1 BID, fluvoxamine  100 mg in AM and 300 mg HS, lorazepam  1 mg 1-2 mg in the AM and HS and 1 tablet prn midday for anxiety, temazepam  30 mg HS. No SE with med changes. Patient received Spravato  84 mg today.  She tolerated it well without any unusual headache, nausea or vomiting or other somatic symptoms.  Dissociation did occur and she gradually saw resolution over the 2-hour period of observation.  She does not typically find the dissociation very strong.  It gets rid of negative emotion for awhile after procedure . She wanted to resume Spravato  bc 30% benefit for dep and anxiety.  Chronic struggles with OCD and family stress and handles it better when on Spravato . No problems with meds and no desire for changes.  01/16/24 appt noted: Current psych meds: Wellbutrin  XL 150 mg AM and  Auvelity  1 BID, fluvoxamine  100 mg in AM and 300 mg HS, lorazepam  1 mg 1-2 mg in the AM and HS and 1 tablet prn midday for anxiety, temazepam   30 mg HS. No SE with med changes. Patient received Spravato  84 mg today.  She tolerated it well without any unusual headache, nausea or vomiting or other somatic symptoms.  Dissociation did occur and she gradually saw resolution over the 2-hour period of observation.  She does not typically find the dissociation very strong.  It gets rid of negative emotion for awhile after procedure . She wanted to resume Spravato  bc 30% benefit for dep and anxiety.  Chronic struggles with OCD and family stress and handles it better when on Spravato . Asks about dropping out of the Wellbutrin .  01/23/24 appt noted: Current psych meds: Wellbutrin  XL 150 mg AM and Auvelity  1 BID, fluvoxamine  100 mg in AM and 300 mg HS, lorazepam  1 mg 1-2 mg in the AM and HS and 1 tablet prn midday for anxiety, temazepam  30 mg HS. No SE with med changes. Patient received Spravato  84 mg today.  She tolerated it well without any unusual headache, nausea or vomiting or other somatic symptoms.  Dissociation did occur and she gradually saw resolution over the 2-hour period of observation.  She does not typically find the dissociation very strong.  It gets rid of negative emotion for awhile after procedure . She wanted to resume Spravato  bc 30% benefit for dep and anxiety.  Chronic struggles with OCD and family stress and handles it better when on Spravato . She is getting benefit from the Spravato  for depression and anxiety Plan no med changes  02/13/24 appt noted: Current psych meds: Wellbutrin  XL 150 mg AM and Auvelity  1 BID, fluvoxamine  100 mg in AM and 300 mg HS, lorazepam  1 mg 1-2 mg in the AM and HS and 1 tablet prn midday for anxiety, temazepam  30 mg HS. No SE with med changes. Patient received Spravato  84 mg today.  She tolerated it well without any unusual headache, nausea or vomiting or other  somatic symptoms.  Dissociation did occur and she gradually saw resolution over the 2-hour period of observation.  She does not typically find the dissociation very strong.  It gets rid of negative emotion for awhile after procedure . She continues to note considerable improvement in depression and anxiety when she misses provide a treatment on an every 2-week basis which is much as she can arrange given her transportation issues.  She is chronically stressed by her dysfunctional family of origin.  She recognizes she does not handle distressed easily.  She states benefit from the medication and does not want any changes  02/27/24 appt noted:  Current psych meds: Wellbutrin  XL 150 mg AM and Auvelity  1 BID, fluvoxamine  100 mg in AM and 300 mg HS, lorazepam  1 mg 1-2 mg in the AM and HS and 1 tablet prn midday for anxiety, temazepam  30 mg HS. No SE nor desire for med changes. Patient received Spravato  84 mg today.  She tolerated it well without any unusual headache, nausea or vomiting or other somatic symptoms.  Dissociation did occur and she gradually saw resolution over the 2-hour period of observation.  She does not typically find the dissociation very strong.  It gets rid of negative emotion for awhile after procedure . Able to leave without assistance. Benefit Spravato  for mood and OCD and wants to continue.  No desire for med change.  Ongoing stressors with family.  OCD is chronic.    03/12/2024 appointment noted: Current psych meds: Wellbutrin  XL 150 mg AM and Auvelity  1 BID, fluvoxamine  100 mg in AM and 300 mg HS, lorazepam   1 mg 1-2 mg in the AM and HS and 1 tablet prn midday for anxiety, temazepam  30 mg HS. No SE nor desire for med changes. Patient received Spravato  84 mg today.  She tolerated it well without any unusual headache, nausea or vomiting or other somatic symptoms.  Dissociation did occur and she gradually saw resolution over the 2-hour period of observation.  She does not typically find  the dissociation very strong.  No med changes desired. Continues to receive benefit from Spravato  and reducing depression and helping to moderate the OCD.  Specifically she is 50% less depressed and has about 25 to 30% less OCD using Spravato .  She is chronically stressed with a dysfunctional family.  She is working on boundary issues with her therapist.  03/26/24 appt noted: Current psych meds: Wellbutrin  XL 150 mg AM and Auvelity  1 BID, fluvoxamine  100 mg in AM and 300 mg HS, lorazepam  1 mg 1-2 mg in the AM and HS and 1 tablet prn midday for anxiety, temazepam  30 mg HS. No SE nor desire for med changes. Patient received Spravato  84 mg today.  She tolerated it well without any unusual headache, nausea or vomiting or other somatic symptoms.  Dissociation did occur and she gradually saw resolution over the 2-hour period of observation.  She does not typically find the dissociation very strong.  No med changes desired.  03/26/24 appt noted: Current psych meds: Wellbutrin  XL 150 mg AM and Auvelity  1 BID, fluvoxamine  100 mg in AM and 300 mg HS, lorazepam  1 mg 1-2 mg in the AM and HS and 1 tablet prn midday for anxiety, temazepam  30 mg HS. No SE nor desire for med changes. Patient received Spravato  84 mg today.  She tolerated it well without any unusual headache, nausea or vomiting or other somatic symptoms.  Dissociation did occur and she gradually saw resolution over the 2-hour period of observation.  She does not typically find the dissociation very strong.   Missed a couple of days of fluvoxamine  and had relapse worsening OCD.  It had been more under control for weeks priot to this past weekend.  Also struggling to keep up Spravato  which is helpful for OCD and dep.  Schedule px are ongoing.   Previous psych med trials include Prozac, paroxetine, sertraline, fluvoxamine , venlafaxine, Anafranil with no response,  Wellbutrin , Viibryd, Trintellix 10 1 month NR Auvelity  BID   Geodon,  risperidone, Rexulti,  Abilify,  Seroquel, Latuda 40 mg with irritability.   lamotrigine lithium,  BuSpar, Namenda,  pramipexole with no response, and Topamax, pindolol  2 brothers & 1 sister poverty, 1 B with untreated bipolar disorder lives with mother  ECT-MADRS    Flowsheet Row Office Visit from 06/29/2021 in [facility]  MADRS Total Score 36      Flowsheet Row Admission (Discharged) from 06/11/2021 in [facility]  C-SSRS RISK CATEGORY No Risk       Review of Systems:  Review of Systems  Constitutional:  Positive for fatigue.  Cardiovascular:  Negative for palpitations.  Musculoskeletal:  Positive for arthralgias, back pain, gait problem, myalgias and neck pain.  Neurological:  Positive for weakness and headaches. Negative for tremors.  Psychiatric/Behavioral:  Positive for dysphoric mood and sleep disturbance. Negative for suicidal ideas. The patient is nervous/anxious.     Medications: I have reviewed the patient's current medications.  Current Outpatient Medications  Medication Sig Dispense Refill   Abaloparatide (TYMLOS) 3120 MCG/1.56ML SOPN Inject into the skin.  Azelastine-Fluticasone  137-50 MCG/ACT SUSP Place 1-2 sprays into both nostrils daily.     baclofen  (LIORESAL ) 10 MG tablet Take 20 mg by mouth at bedtime as needed for muscle spasms.     buPROPion  (WELLBUTRIN  XL) 150 MG 24 hr tablet TAKE 1 TABLET (150 MG TOTAL) BY MOUTH IN THE MORNING 90 tablet 0   Dextromethorphan-buPROPion  ER (AUVELITY ) 45-105 MG TBCR Take 1 tablet by mouth in the morning and at bedtime. 60 tablet 1   dicyclomine (BENTYL) 10 MG capsule Take 10 mg by mouth daily.     docusate sodium  (COLACE) 100 MG capsule Take 1 capsule (100 mg total) by mouth 2 (two) times daily. (Patient taking differently: Take 100 mg by mouth daily.) 10 capsule 0   Esketamine HCl, 84 MG Dose, (SPRAVATO , 84 MG DOSE,) 28 MG/DEVICE SOPK USE 3 SPRAYS IN EACH NOSTRIL ONCE A WEEK 3 each 1    fexofenadine (ALLEGRA) 180 MG tablet Take 180 mg by mouth daily.     fluvoxaMINE  (LUVOX ) 100 MG tablet TAKE 1 TABLET IN THE AM AND 3 TABLETS AT NIGHT 120 tablet 2   hydrocortisone  (ANUSOL -HC) 2.5 % rectal cream Place rectally 2 (two) times daily. x 7-14 days 30 g 0   ketotifen  (ZADITOR ) 0.025 % ophthalmic solution Place 3 drops into both eyes at bedtime.     LORazepam  (ATIVAN ) 1 MG tablet TAKE 1-2 TABS EVERY MORNING, 1-2 TABS AT BEDTIME & 1 TAB IN THE AFTERNOON WHEN NEEDED -ANXIETY/SLEEP 150 tablet 1   magnesium gluconate (MAGONATE) 500 MG tablet Take 500 mg by mouth daily.     meloxicam  (MOBIC ) 15 MG tablet Take 1 tablet (15 mg total) by mouth daily. 30 tablet 0   meloxicam  (MOBIC ) 15 MG tablet TAKE 1 TABLET (15 MG TOTAL) BY MOUTH DAILY. 30 tablet 0   methylPREDNISolone  (MEDROL  DOSEPAK) 4 MG TBPK tablet Take as directed 21 each 0   MIBELAS 24 FE 1-20 MG-MCG(24) CHEW Chew 1 tablet by mouth at bedtime as needed (bowel regularity).     Multiple Vitamins-Minerals (ADULT GUMMY PO) Take 2 tablets by mouth in the morning.     nitrofurantoin (MACRODANTIN) 100 MG capsule Take 100 mg by mouth as needed (For urinary tract infection.).      oxyCODONE -acetaminophen  (PERCOCET/ROXICET) 5-325 MG tablet Take 1-2 tablets by mouth every 6 (six) hours as needed for severe pain. 50 tablet 0   polyethylene glycol (MIRALAX  / GLYCOLAX ) packet Take 17 g by mouth daily as needed for mild constipation. 14 each 0   propranolol  ER (INDERAL  LA) 60 MG 24 hr capsule TAKE 1 CAPSULE BY MOUTH EVERY DAY 30 capsule 2   psyllium (METAMUCIL) 58.6 % powder Take 1 packet by mouth daily as needed (constipation).     SUMAtriptan  (IMITREX ) 100 MG tablet Take 1 tablet (100 mg total) by mouth every 2 (two) hours as needed for migraine. May repeat in 2 hours if headache persists or recurs. 12 tablet 2   temazepam  (RESTORIL ) 30 MG capsule TAKE 1 CAPSULE BY MOUTH AT BEDTIME AS NEEDED FOR SLEEP 30 capsule 0   Vitamin D-Vitamin K (VITAMIN  K2-VITAMIN D3 PO) Take 1-2 sprays by mouth daily.     No current facility-administered medications for this visit.    Medication Side Effects: None   Allergies:  Allergies  Allergen Reactions   Hydrocodone  Itching   Sulfamethoxazole-Trimethoprim Itching   Dust Mite Extract Other (See Comments)    Sneezing, watery eyes, runny nose   Latex Itching   Other Other (  See Comments)    PT IS ALLERGIC TO CAT DANDER AND RAGWEED - Sneezing, watery eyes, runny nose    Pollen Extract Other (See Comments)    Sneezing, watery eyes, runny nose     Past Medical History:  Diagnosis Date   Abnormal Pap smear 2011   hpv/mild dysplasia,cin1   Anxiety    Cerebral palsy (HCC)    right arm/leg   Cystocele    Depression    Headache    Neuromuscular disorder (HCC)    Cerebral Palsy   OCD (obsessive compulsive disorder)    Osteoporosis    Uterine prolaps     Family History  Problem Relation Age of Onset   Cancer Father        skin AND LUNG   Alcohol abuse Sister        CRACK COCAINE    Social History   Socioeconomic History   Marital status: Married    Spouse name: Not on file   Number of children: Not on file   Years of education: Not on file   Highest education level: Not on file  Occupational History   Not on file  Tobacco Use   Smoking status: Never   Smokeless tobacco: Never  Substance and Sexual Activity   Alcohol use: Not Currently    Comment: OCCASIONAL beer   Drug use: No   Sexual activity: Yes    Birth control/protection: Pill    Comment: LOESTRIN 24 FE  Other Topics Concern   Not on file  Social History Narrative   Not on file   Social Drivers of Health   Financial Resource Strain: Not on file  Food Insecurity: Not on file  Transportation Needs: Not on file  Physical Activity: Not on file  Stress: Not on file  Social Connections: Not on file  Intimate Partner Violence: Not on file    Past Medical History, Surgical history, Social history, and Family  history were reviewed and updated as appropriate.   Please see review of systems for further details on the patient's review from today.   Objective:   Physical Exam:  LMP  (LMP Unknown)   Physical Exam Constitutional:      General: She is not in acute distress. Neurological:     Mental Status: She is alert and oriented to person, place, and time.     Cranial Nerves: No dysarthria.     Motor: Weakness present.     Gait: Gait abnormal.  Psychiatric:        Attention and Perception: Attention and perception normal.        Mood and Affect: Mood is anxious and depressed. Affect is not labile.        Speech: Speech normal. Speech is not rapid and pressured or slurred.        Behavior: Behavior normal. Behavior is cooperative.        Thought Content: Thought content normal. Thought content is not delusional. Thought content does not include homicidal or suicidal ideation. Thought content does not include suicidal plan.        Cognition and Memory: Cognition and memory normal. Cognition is not impaired.        Judgment: Judgment normal.     Comments: Insight intact Ongoing OCD remains fairly severe but less anxious Checking compulsions now about 30 mins Chronic depression persistent but better than it was a year ago better than before Spravato        Lab Review:  Component Value Date/Time   NA 138 06/11/2021 0606   K 4.0 06/11/2021 0606   CL 107 06/11/2021 0606   CO2 26 06/11/2021 0606   GLUCOSE 90 06/11/2021 0606   BUN 18 06/11/2021 0606   CREATININE 0.81 06/11/2021 0606   CALCIUM 9.4 06/11/2021 0606   PROT 6.5 06/11/2021 0606   ALBUMIN  3.3 (L) 06/11/2021 0606   AST 17 06/11/2021 0606   ALT 14 06/11/2021 0606   ALKPHOS 141 (H) 06/11/2021 0606   BILITOT 0.2 (L) 06/11/2021 0606   GFRNONAA >60 06/11/2021 0606   GFRAA >60 07/09/2016 0438       Component Value Date/Time   WBC 5.8 06/11/2021 0606   RBC 4.12 06/11/2021 0606   HGB 12.5 06/11/2021 0606   HCT 39.7  06/11/2021 0606   PLT 299 06/11/2021 0606   MCV 96.4 06/11/2021 0606   MCH 30.3 06/11/2021 0606   MCHC 31.5 06/11/2021 0606   RDW 13.9 06/11/2021 0606   LYMPHSABS 1.9 06/11/2021 0606   MONOABS 0.5 06/11/2021 0606   EOSABS 0.1 06/11/2021 0606   BASOSABS 0.0 06/11/2021 0606    No results found for: "POCLITH", "LITHIUM"   No results found for: "PHENYTOIN", "PHENOBARB", "VALPROATE", "CBMZ"   .res Assessment: Plan:    Revonda "[name]" was seen today for follow-up, depression, anxiety, stress and sleeping problem.  Diagnoses and all orders for this visit:  Recurrent major depression resistant to treatment (HCC) (r/o MDD+Dysthymia)  Mixed obsessional thoughts and acts  Social anxiety disorder  Caregiver stress  Migraine without aura and without status migrainosus, not intractable  Insomnia due to mental condition    Both primary Dx of OCD and major depression are TR and marked.  Impaired function but less so with Spravato  re: depression..   She has been receiving Spravato  84 mg weekly and moderate improvement in the depression..  she feels it also helps OCD somewhat.  However still easily overwhelmed with low stress tolerance.  Family contributes to her anxiety and stress markedly and triggers mood changes  The OCD is improved with the increase in fluvoxamine  and with Spravato .  Spends up to 2 hours daily and checking compulsions on her worst days but better when she travels.  No new options for tx are evident.  She has been on higher doses of fluvoxamine  above the usual max of 400 mg daily in the past and finds it more helpful at the higher dose.    Disc SE.   She is tolerating the meds well  Continue  Luvox  back to 400 mg nightly as of January 2023. Disc dosing higher than usual.  She feels this is increase has helped more with OCD which remains chronically severe.  Disc even higher than usual dosing.   She had worsening sx after missing a couple of days out of town dosing.    Disc SE in detail and SSRI withdrawal sx.  Continue Auvelity  BID and reduced Wellbutrin  to 1 AM. It has helped and is tolerated.   We have discussed seizure risk that is possible using this combination but given severity of her symptoms she feels the risk is warranted.  Do not consider stopping this until she gets the full benefit of Spravato .  Disc dosing again. There are few other alternative medication options that remain.   Disc DDI issues.   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 She doesn't want to try this now.  We discussed the short-term risks associated with benzodiazepines  including sedation and increased fall risk among others.  Discussed long-term side effect risk including dependence, potential withdrawal symptoms, and the potential eventual dose-related risk of dementia.  But recent studies from 2020 dispute this association between benzodiazepines and dementia risk. Newer studies in 2020 do not support an association with dementia. Disc this is high dose and not ideal.  Also disc risk combining it with temazepam . Rec try gradually reduce HS lorazepam  to 1 mg Hs.  Can continue lorazepam  2 mg AM and 1 mg in afternoon bc of chronic anxiety and it is helpful and tolerated. She can continue temazepam  30 mg nightly.  She tends to have a lot of anxious negative thoughts at night when she is trying to go to bed.  She is trying to reduce the dose.  Complaining of HA and history migraine.  Asks for increase imitrex  and disc preventatives like propranolol  ER imitrex  to 100 mg prn migraine and propranolol  ER for migraine prevention.  Disc trasportation problems limiting access to Spravato .  Disc alternative options.   Also working on trying to develop social and spiritual goals.  No med changes  continue Auvelity  1 in AM & PM continue Wellbutrin  XL 150 mg AM Fluvoxamine  100 mg AM and 300 mg PM above usual max bc med necessary & tolerated. Lorazepam  2 mg HS and  prn 1 mg BID prn anxiety daily Temazepam  30 mg HS Propranolol  ER 60 mg daily Imitrex  prn migraine.  She wants to increase #12/month  She wants to rcontinue biweekly Spravato  now bc dep worse without it.  FU 2-weeks   Nori Beat, MD, DFAPA  Please see After Visit Summary for patient specific instructions.  Future Appointments  Date Time Provider Department Center  03/27/2024  1:00 PM Maretta Shaper, PhD [facility] None  04/10/2024  1:00 PM Maretta Shaper, PhD [facility] None  04/25/2024 11:00 AM [facility]  04/25/2024  2:00 PM Maretta Shaper, PhD [facility] None  05/24/2024 10:00 AM Maretta Shaper, PhD [facility] None  06/04/2024  3:00 PM Maretta Shaper, PhD [facility] None    No orders of the defined types were placed in this encounter.    -------------------------------

## 2024-03-27 ENCOUNTER — Ambulatory Visit (INDEPENDENT_AMBULATORY_CARE_PROVIDER_SITE_OTHER): Admitting: Psychiatry

## 2024-03-27 DIAGNOSIS — F4010 Social phobia, unspecified: Secondary | ICD-10-CM | POA: Diagnosis not present

## 2024-03-27 DIAGNOSIS — F422 Mixed obsessional thoughts and acts: Secondary | ICD-10-CM | POA: Diagnosis not present

## 2024-03-27 DIAGNOSIS — Z638 Other specified problems related to primary support group: Secondary | ICD-10-CM

## 2024-03-27 DIAGNOSIS — F339 Major depressive disorder, recurrent, unspecified: Secondary | ICD-10-CM | POA: Diagnosis not present

## 2024-03-27 DIAGNOSIS — Z636 Dependent relative needing care at home: Secondary | ICD-10-CM
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
## 2024-03-28 ENCOUNTER — Telehealth: Payer: Self-pay | Admitting: Psychiatry

## 2024-03-28 NOTE — Telephone Encounter (Signed)
 LF 5/19, due 6/16

## 2024-03-28 NOTE — Telephone Encounter (Signed)
 Pt at checkout requested a refill of Temazepam  to  [facility] [address] Jonette Nestle, [place] - [address] [place], [place] [address] Phone: [phone]  Fax: [phone]   Next appt 8/18

## 2024-03-31 ENCOUNTER — Other Ambulatory Visit: Payer: Self-pay

## 2024-03-31 DIAGNOSIS — F5105 Insomnia due to other mental disorder: Secondary | ICD-10-CM

## 2024-03-31 NOTE — Telephone Encounter (Signed)
Pended temazepam

## 2024-04-01 MED ORDER — TEMAZEPAM 30 MG PO CAPS: ORAL_CAPSULE | ORAL | 1 refills | Status: DC

## 2024-04-03 ENCOUNTER — Telehealth: Payer: Self-pay | Admitting: Psychiatry

## 2024-04-03 ENCOUNTER — Other Ambulatory Visit: Payer: Self-pay | Admitting: Psychiatry

## 2024-04-03 DIAGNOSIS — F4010 Social phobia, unspecified: Secondary | ICD-10-CM

## 2024-04-03 NOTE — Telephone Encounter (Signed)
 Pt called and asked for a refill on her lorazapam 1 mg. [facility] is [facility] [address]

## 2024-04-03 NOTE — Telephone Encounter (Signed)
 Addressed thru pharmacy interface.

## 2024-04-07 ENCOUNTER — Other Ambulatory Visit: Payer: Self-pay | Admitting: Psychiatry

## 2024-04-07 DIAGNOSIS — F339 Major depressive disorder, recurrent, unspecified: Secondary | ICD-10-CM

## 2024-04-07 DIAGNOSIS — F422 Mixed obsessional thoughts and acts: Secondary | ICD-10-CM

## 2024-04-10 ENCOUNTER — Ambulatory Visit (INDEPENDENT_AMBULATORY_CARE_PROVIDER_SITE_OTHER): Admitting: Psychiatry

## 2024-04-10 DIAGNOSIS — F339 Major depressive disorder, recurrent, unspecified: Secondary | ICD-10-CM | POA: Diagnosis not present

## 2024-04-10 DIAGNOSIS — G809 Cerebral palsy, unspecified: Secondary | ICD-10-CM

## 2024-04-10 DIAGNOSIS — F422 Mixed obsessional thoughts and acts: Secondary | ICD-10-CM | POA: Diagnosis not present

## 2024-04-10 DIAGNOSIS — F4010 Social phobia, unspecified: Secondary | ICD-10-CM

## 2024-04-10 DIAGNOSIS — Z636 Dependent relative needing care at home: Secondary | ICD-10-CM

## 2024-04-10 DIAGNOSIS — Z638 Other specified problems related to primary support group: Secondary | ICD-10-CM
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
## 2024-04-12 ENCOUNTER — Other Ambulatory Visit: Payer: Self-pay | Admitting: Podiatry

## 2024-04-17 ENCOUNTER — Ambulatory Visit: Admitting: Podiatry

## 2024-04-25 ENCOUNTER — Other Ambulatory Visit

## 2024-04-25 ENCOUNTER — Ambulatory Visit (INDEPENDENT_AMBULATORY_CARE_PROVIDER_SITE_OTHER): Admitting: Psychiatry

## 2024-04-25 DIAGNOSIS — F4010 Social phobia, unspecified: Secondary | ICD-10-CM

## 2024-04-25 DIAGNOSIS — Z638 Other specified problems related to primary support group: Secondary | ICD-10-CM

## 2024-04-25 DIAGNOSIS — F339 Major depressive disorder, recurrent, unspecified: Secondary | ICD-10-CM

## 2024-04-25 DIAGNOSIS — F422 Mixed obsessional thoughts and acts: Secondary | ICD-10-CM

## 2024-04-25 DIAGNOSIS — Z636 Dependent relative needing care at home: Secondary | ICD-10-CM

## 2024-04-25 DIAGNOSIS — F521 Sexual aversion disorder: Secondary | ICD-10-CM

## 2024-04-25 DIAGNOSIS — G809 Cerebral palsy, unspecified: Secondary | ICD-10-CM
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
## 2024-05-06 ENCOUNTER — Telehealth: Payer: Self-pay | Admitting: Psychiatry

## 2024-05-06 NOTE — Telephone Encounter (Signed)
 Pt last filled temazepam  6/18 and has a RF available.

## 2024-05-06 NOTE — Telephone Encounter (Signed)
 LVM to [facility]

## 2024-05-06 NOTE — Telephone Encounter (Signed)
 Pt lvm 7/19 1:44 pm asking if she needs RF for Temazepam ? Stated out of town and don't have bottle with her. Contact # [phone] [address]

## 2024-05-07 ENCOUNTER — Ambulatory Visit: Admitting: Psychiatry

## 2024-05-15 ENCOUNTER — Telehealth: Payer: Self-pay

## 2024-05-16 NOTE — Telephone Encounter (Signed)
 PA approved for Fluvoxamine  100 mg #120/30 day with [facility] effective 04/15/24-05/15/25, EJ#[id number]

## 2024-05-21 ENCOUNTER — Ambulatory Visit: Admitting: Psychiatry

## 2024-05-24 ENCOUNTER — Ambulatory Visit (INDEPENDENT_AMBULATORY_CARE_PROVIDER_SITE_OTHER): Admitting: Psychiatry

## 2024-05-24 DIAGNOSIS — F4010 Social phobia, unspecified: Secondary | ICD-10-CM | POA: Diagnosis not present

## 2024-05-24 DIAGNOSIS — Z638 Other specified problems related to primary support group: Secondary | ICD-10-CM

## 2024-05-24 DIAGNOSIS — F339 Major depressive disorder, recurrent, unspecified: Secondary | ICD-10-CM

## 2024-05-24 DIAGNOSIS — F422 Mixed obsessional thoughts and acts: Secondary | ICD-10-CM

## 2024-05-24 DIAGNOSIS — Z636 Dependent relative needing care at home: Secondary | ICD-10-CM
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
## 2024-05-30 ENCOUNTER — Other Ambulatory Visit: Payer: Self-pay | Admitting: Podiatry

## 2024-05-30 ENCOUNTER — Other Ambulatory Visit: Payer: Self-pay | Admitting: Psychiatry

## 2024-05-30 DIAGNOSIS — F5105 Insomnia due to other mental disorder: Secondary | ICD-10-CM

## 2024-05-30 DIAGNOSIS — F339 Major depressive disorder, recurrent, unspecified: Secondary | ICD-10-CM

## 2024-06-03 ENCOUNTER — Ambulatory Visit (INDEPENDENT_AMBULATORY_CARE_PROVIDER_SITE_OTHER): Admitting: Psychiatry

## 2024-06-03 ENCOUNTER — Encounter: Payer: Self-pay | Admitting: Psychiatry

## 2024-06-03 ENCOUNTER — Other Ambulatory Visit: Payer: Self-pay | Admitting: Psychiatry

## 2024-06-03 DIAGNOSIS — G43009 Migraine without aura, not intractable, without status migrainosus: Secondary | ICD-10-CM

## 2024-06-03 DIAGNOSIS — F5105 Insomnia due to other mental disorder: Secondary | ICD-10-CM

## 2024-06-03 DIAGNOSIS — F4010 Social phobia, unspecified: Secondary | ICD-10-CM | POA: Diagnosis not present

## 2024-06-03 DIAGNOSIS — F339 Major depressive disorder, recurrent, unspecified: Secondary | ICD-10-CM

## 2024-06-03 DIAGNOSIS — F422 Mixed obsessional thoughts and acts: Secondary | ICD-10-CM

## 2024-06-03 MED ORDER — CLOMIPRAMINE HCL 25 MG PO CAPS: 25.0000 mg | ORAL_CAPSULE | Freq: Every day | ORAL | 1 refills | Status: DC

## 2024-06-03 NOTE — Progress Notes (Signed)
 Casey Diaz [id number] 20-Oct-1965 58 y.o.    Subjective:   Patient ID:  Casey Diaz is a 58 y.o. (DOB September 20, 1966) female.  Chief Complaint:  Chief Complaint  Patient presents with   Follow-up   Depression   Anxiety   Fatigue   Stress   Sleeping Problem     HPI Casey Diaz presents to the office today for follow-up of OCD and severe anxiety.     December 2019 visit the following was noted: No meds were changed. Lives in [name] and back for followup.  Sx are about the same.  Has to take meds with different sizes. Pt reports that mood is Anxious and Depressed and describes anxiety as Severe. Anxiety symptoms include: Excessive Worry, Obsessive Compulsive Symptoms:   Checking,,. Pt reports has interrupted sleep and nocturia. Pt reports that appetite is good. Pt reports that energy is no change and down slightly. Concentration is down slightly. Suicidal thoughts:  denied by patient. Loves the environment of [name] but misses some things there.  She's not able to work there.  H works there and likes it.  Struggled with not working, feels isolated and not up to task of meeting people.  Does attend a church and met a friend who's been helpful.  Leaving for [name] on 10/16/18.   04/09/2020 appointment the following is noted:  Staying another year in [name] bc Covid and other things. Last few months a lot of crying spells.  Is in menopause. Wonders about med changes though is nervous about it.  Crying spells associated with depressing thoughts more than stress or OCD.   Covid really hard on everyone and couldn't see family for 18 mos.  Family still very dysfunctional. No close friends in part due to OCD and depression. Son high Autism spectrum with ADHD and anxiety and she's with him all the time. Greater health problems with CP so more pains.   05/15/20 appt with the following noted: Started Estroven for menopause and helps some. Still depressed.  Chronically. In [place] more  weeks then to [name] for another year. A lot of stressors lately triggering more checking and anxiety.   OCD is her CC now and seems.  Got worse DT stress.   Stressed with Asberger's son and her health.  H works a lot.  Her FOO still stress. Plan: Trintellix 10 mg 1 tablet in the morning with food and reduce fluvoxamine  to 5 tablets nightly for 1 week  then reduce it to 4 tablets nightly.   07/02/20 appt with the following noted: Decided not to get Trintellix bc difficulty getting it. It is available.  There.  Wants to start it now.   Both depression and OCD are severe.  Not suicidal in intent or plan. Did not take samples with her to [name] but will be back in December. covid is worse there and travel is difficult.  Wants to reduce Wellbutrin  DT dry mouth. Plan: She's afraid to reduce Luvox  at this time DT fear of worsening OCD.  But will consider. Trintellix 10 mg 1 tablet in the morning with food and reduce fluvoxamine  to 5 tablets nightly for 1 week  then reduce it to 4 tablets nightly. Also reduce Wellbutrin  XL to 300 mg daily.    9-13 2022 appointment with the following noted: [place].  Broke arm a month ago and surgery.  It's all been rough adjustment.   B has cancer on his face and M  fell taking him to the doctor.  Misses the water and weather of [name].   Cry a lot more since menopause. Still depression and anxiety and OCD.  Asks about ketamine. On Wellbutrin  300, Luvox  300.  No Trintellix. Added Ativan  2 mg AM and HS and it helps.  More likely to get upset at night. Plan: Increase Luvox  back to 400 mg daily.  She thinks she's worse on less. Continue Wellbutrin  XL to 300 mg daily. Plan to start Spravato  for TRD asap   09/27/2021 appointment with the following noted:  She has started Spravato  today at 56 mg intranasally.  She tolerated it well without unusual nausea or vomiting headache or other somatic symptoms.  She did have the expected dissociation which  gradually resolved over the course of the 2-hour period of observation.  She was a little concerned about her balance given her cerebral palsy but has not noted unusual or unexpected problems.  She is motivated to can continue Spravato  in hopes of reducing her depressive symptoms. 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  She also has treatment resistant OCD which is partially managed with medications but is still quite disabling.  She is tolerating the medications well.  She is sleeping adequately.  Her appetite is adequate.  She is not having suicidal thoughts.  She continues to wish for a better treatment for OCD that would give her some relief.  09/30/2021 appointment with the following noted: She received her first dose of Spravato  84 mg intranasally today.  She tolerated it well without unusual nausea, vomiting, or other somatic symptoms.  Dissociation as expected did occur and gradually resolved over the 2-hour period of observation.  She did have a mild headache today with the treatment and received ibuprofen 600 mg at her request.  We will follow this to see if it is a pattern Patient is still depressed.  She said she was late with her medicine today and today was a particularly depressing day.  However she notes that the Spravato  has lifted her mood considerably even today.  She is hopeful that it will continue to be helpful.  No suicidal thoughts.  She has ongoing chronic anxiety and OCD at baseline.  10/04/21 appt noted: Patient received Spravato  84 mg for the second time today.  She tolerated it well without any unusual headache, nausea or vomiting or other somatic symptoms.  Dissociation did occur and she gradually [place] resolution over the 2-hour period of observation. She did not have any unusual problems after she left the office last Spravato  administration.  She did not have any specific problems with balance or walking.  She is at increased risk of  that difficulty because of cerebral palsy.  So far she has not noticed much mood effect from the medication beyond the first day of receiving it.  However she would like to continue Spravato  in hopes of getting the antidepressant effect that is desired. Stress dealing with mother's behavior at party pt hosted.  Guilt over it.  10/07/2021 appointment noted: Patient received Spravato  84 mg for the second time today.  She tolerated it well without any unusual headache, nausea or vomiting or other somatic symptoms.  Dissociation did occur and she gradually [place] resolution over the 2-hour period of observation. She still is not sure about the antidepressant effect of Spravato .  Events over the holidays and demands, make it difficult to assess.  She still notes that the OCD tends to worsen the depression  and vice versa.  She tolerates the Spravato  well and wants to continue the trial.  10/15/2021 appointment with the following noted: Patient received Spravato  84 mg for the second time today.  She tolerated it well without any unusual headache, nausea or vomiting or other somatic symptoms.  Dissociation did occur and she gradually [place] resolution over the 2-hour period of observation. Patient says it was somewhat difficult to evaluate the effect of the Spravato .  It was scheduled to be twice weekly for 4 weeks consecutively but the holidays have interfered with that administration.  She asked what specifically should be she should be looking for in order to assess improvement.  That was discussed.  The OCD is unchanged and the depression so far is not significantly different.  She still tolerates meds.  There have been no recent med changes  10/19/2021 appt noted: Patient received Spravato  84 mg for the second today.  She tolerated it well without any unusual headache, nausea or vomiting or other somatic symptoms.  Dissociation did occur and she gradually saw resolution over the 2-hour period of observation.    10/21/2021 appointment noted: Patient received Spravato  84 mg today.  She tolerated it well without any unusual headache, nausea or vomiting or other somatic symptoms.  Dissociation did occur and she gradually saw resolution over the 2-hour period of observation.  She feels better than last week.  She is not as depressed and down.  She is still dealing with grief around the death of her cousin that was unexpected.  It is still difficult to tell how much the Spravato  was doing but she is hopeful.  Anxiety is still present with the OCD.  She is not having suicidal thoughts.  She is not hopeless.  She wants to continue treatment.  10/25/2021 appointment with the following noted: Patient received Spravato  84 mg today.  She tolerated it well without any unusual headache, nausea or vomiting or other somatic symptoms.  Dissociation did occur and she gradually saw resolution over the 2-hour period of observation.  She does not typically find the dissociation very strong. She is beginning to think the Spravato  is helping somewhat with the depression.  It has been difficult to tell with the holidays intervening as well as the death of her cousin.  She has not been able to get Spravato  twice weekly for 4 weeks straight as typically planned.  However she is hopeful.  The OCD remains significant.  She still has a tendency to think very negatively.  She is not suicidal.  10/28/2021 appointment with the following noted: Patient received Spravato  84 mg today.  She tolerated it well without any unusual headache, nausea or vomiting or other somatic symptoms.  Dissociation did occur and she gradually saw resolution over the 2-hour period of observation.  She does not typically find the dissociation very strong. She is feeling more hopeful about the administration of Spravato .  She is having less depression she believes.  Still not dramatically different.  She still has a tendency to have a lot of anxiety and rumination and  OCD.  She is not suicidal.  She is eager to continue the Spravato .  11/01/2021 appointment with the following noted: Patient received Spravato  84 mg today.  She tolerated it well without any unusual headache, nausea or vomiting or other somatic symptoms.  Dissociation did occur and she gradually saw resolution over the 2-hour period of observation.  She does not typically find the dissociation very strong. She is continuing to see  a little bit of improvement in depression with Spravato .  The anxiety remains but may be not as severe.  The OCD remains markedly severe chronically.  She is not suicidal.  She is encouraged by the degree of improvement with Spravato  and inability to enjoy things more and not be quite as ruminative.  11/04/2021 appt noted: Patient received Spravato  84 mg today.  She tolerated it well without any unusual headache, nausea or vomiting or other somatic symptoms.  Dissociation did occur and she gradually saw resolution over the 2-hour period of observation.  She does not typically find the dissociation very strong. No SE complaints with meds. She continues to feel hopeful about the Spravato .  She has less depression.  Because of a number of factors she is uncertain of the full benefit but thinks she is somewhat less depressed.  Her anxiety and OCD remain significant but a little better.  She is tolerating the medications and does not desire medicine change.  She is not currently complaining of insomnia.   11/08/2021 appointment the following noted: Patient received Spravato  84 mg today.  She tolerated it well without any unusual headache, nausea or vomiting or other somatic symptoms.  Dissociation did occur and she gradually saw resolution over the 2-hour period of observation.  She does not typically find the dissociation very strong. No SE complaints with meds. She feels the Spravato  is helping somewhat.  She would like to see a greater effect.  However she is able to enjoy things.   She is productive at home.  She would like to see a lifting of a degree of sadness that remains.  The anxiety and OCD remained largely unchanged.  She wondered about the dosing of Wellbutrin  300 mg a day and Luvox  300 mg a day and possible increases.  She has been at higher doses in the past.  She plans to start water therapy for her weakness and for her shoulder.  11/11/2021 appointment with the following noted: Patient received Spravato  84 mg today.  She tolerated it well without any unusual headache, nausea or vomiting or other somatic symptoms.  Dissociation did occur and she gradually saw resolution over the 2-hour period of observation.  She does not typically find the dissociation very strong. No SE complaints with meds. She feels the Spravato  is clearly helping the depression.  She would like to see a more significant effect.  She is still having trouble thinking positive. Her energy is fair.  Concentration is good except for the problem with chronic obsessions. She has been taking Wellbutrin  300 mg in Luvox  300 mg for quite some time but has taken higher doses in the past.  We discussed that.  She would like to try higher doses in order to get a better effect if possible. We just increased the doses a couple of days ago.  No effect yet.  11/15/2021 appointment with the following noted: Patient received Spravato  84 mg today.  She tolerated it well without any unusual headache, nausea or vomiting or other somatic symptoms.  Dissociation did occur and she gradually saw resolution over the 2-hour period of observation.  She does not typically find the dissociation very strong. No SE complaints with meds. The patient is now convinced that the Spravato  is helping the depression.  She would like to continue twice weekly Spravato  this week if possible.  She has tolerated the increase in Wellbutrin  to 450 mg daily and the increase and fluvoxamine  to 400 mg daily without complications thus  far.  The OCD  and anxiety feed the depression to some extent. She spends approximately 2 hours daily with checking compulsions due to obsessions about causing harm to others.  For example fearing that when she has hit a pot hole that she may have hit a person and going back to check.  Checking corners and rooms out of fear that she may have harmed someone.  Other various checking compulsions.  She is hoping the increase in fluvoxamine  to 400 mg will reduce that over the weeks to come.  She is not seeing a significant difference with the addition of the Spravato  though she understands that was not expected.  She is more productive at home and more motivated and able to enjoy things more fully as a result of the Spravato  treatment.  She is tolerating the medication  11/18/2021 appointment with the following noted: Patient received Spravato  84 mg today.  She tolerated it well without any unusual headache, nausea or vomiting or other somatic symptoms.  Dissociation did occur and she gradually saw resolution over the 2-hour period of observation.  She does not typically find the dissociation very strong. No SE complaints with meds. She clearly believes the Spravato  has been helpful for the depression.  She wonders whether to continue to treatments weekly or to cut back to 1 weekly.  She would like to continue twice weekly in hopes of getting additional improvement in the depression because it is not resolved but it is difficult to get here twice a week in terms of arranging rides. She is recently increased Wellbutrin  XL to 450 mg daily and fluvoxamine  to 400 mg daily but they have not had time to have an official effect.  She is tolerating that well.  She is tolerating meds overwork overall well. The OCD remains the same as noted on 11/15/2021  11/25/21 appt noted: Patient received Spravato  84 mg today.  She tolerated it well without any unusual headache, nausea or vomiting or other somatic symptoms.  Dissociation did occur and  she gradually saw resolution over the 2-hour period of observation.  She does not typically find the dissociation very strong. No SE complaints with meds. She thinks the increase in Wellbutrin  and Luvox  have been potentially helpful for depression and OCD respectively.  It has been too early to see the full effect.  She is sleeping and eating well.  She is functioning at home.  She still spends a lot of time that is about 2 hours a day dealing with compulsive behaviors.  12/02/21 appt noted: Patient received Spravato  84 mg today.  She tolerated it well without any unusual headache, nausea or vomiting or other somatic symptoms.  Dissociation did occur and she gradually saw resolution over the 2-hour period of observation.  She does not typically find the dissociation very strong. No SE complaints with meds. Several losses and stressors recently that affect her sense of mood. However still sees significant benefit from the Spravato  for her depression.  Wants to continue it. Suspect early  some benefit from the increased Wellbutrin  for depression and Luvox  for OCD. Tolerating meds. No complaints about the meds. Sleeping and eating well.  No new health concerns.  12/09/21 appt noted: Patient received Spravato  84 mg today.  She tolerated it well without any unusual headache, nausea or vomiting or other somatic symptoms.  Dissociation did occur and she gradually saw resolution over the 2-hour period of observation.  She does not typically find the dissociation very strong. No SE  complaints with meds. Seeing noticeable improvement from increase fluvoxamine  to 400 mg daily.  Tolerating meds without concerns over them. Depression is stable with residual sx of easy guilt and easily stressed.  OCD contributes to depression but depression is not severe with less crying spells.  Productive at home with chores.  Enjoyed recent birthday.  Sleeping good. No new concerns.  12/23/2021 appointment noted: Patient  received Spravato  84 mg today.  She tolerated it well without any unusual headache, nausea or vomiting or other somatic symptoms.  Dissociation did occur and she gradually saw resolution over the 2-hour period of observation.  She does not typically find the dissociation very strong. No SE complaints with meds. Seeing noticeable improvement from increase fluvoxamine  to 400 mg daily.  Tolerating meds without concerns over them. Her depression is somewhat improved with the Spravato .  She also feels generally a little lighter.  She is more motivated.  She is less overwhelmed by guilt.  The OCD is gradually improving but is still quite time-consuming as noted before.  She is sleeping well.  No side effects  12/30/2021 appointment with the following noted: Patient received Spravato  84 mg today.  She tolerated it well without any unusual headache, nausea or vomiting or other somatic symptoms.  Dissociation did occur and she gradually saw resolution over the 2-hour period of observation.  She does not typically find the dissociation very strong. No SE complaints with meds. Seeing noticeable improvement from increase fluvoxamine  to 400 mg daily.  Tolerating meds without concerns over them. She is confident of her the improvement seen with Spravato .  She is less hopeless.  Guilt is marked remarkably improved.  She is not having any thoughts of death or dying.  She is more motivated for activities such as exercise which she is recently started.  She is sleeping well. The OCD remains severe but it is improving somewhat with the increase in fluvoxamine .  It is still consuming a couple hours per day.  01/10/22 apravato 84 admin  01/24/22 appt noted: Patient received Spravato  84 mg today.  She tolerated it well without any unusual headache, nausea or vomiting or other somatic symptoms.  Dissociation did occur and she gradually saw resolution over the 2-hour period of observation.  She does not typically find the  dissociation very strong. No SE complaints with meds. Very tearful today.  Feels like she has been suppressing emotion in the Spravato  caused it to be released.  Discussed some stressors.  Overall still feels the medicine is helpful.  She has missed some of the scheduled Spravato  treatments that were intended to be weekly due to circumstances beyond her control.  She is still struggling with OCD as previously noted but does believe the medications are helpful. Plan no med changes  01/31/2022 received Spravato  84 mg today  02/09/2022 appointment with the following noted: Patient received Spravato  84 mg today.  She tolerated it well without any unusual headache, nausea or vomiting or other somatic symptoms.  Dissociation did occur and she gradually saw resolution over the 2-hour period of observation.  She does not typically find the dissociation very strong. No SE complaints with meds. Spravato  clearly helps depression and OCD but easily gets overwhelmed and tearful with fairly routine stressors.  Tolerating meds. Sleep and appetite is OK Asks to increase lorazepam  to 2 mg AM and HS and 1mg  afternoon  02/16/22 appt noted: Patient received Spravato  84 mg today.  She tolerated it well without any unusual headache, nausea or vomiting  or other somatic symptoms.  Dissociation did occur and she gradually saw resolution over the 2-hour period of observation.  She does not typically find the dissociation very strong. No SE complaints with meds. She has chronic depesssion and OCD but is improved with Spravato , both dx versus before.  She has continued Luvox  400 mg and Wellbutrin  450 mg and is tolerating it.  Chronically easily stressed.  Tolerating all meds.  Doesn't like taking more meds.  Spending a couple hours daily with OCD.  No SI No med changes.  02/21/22 appt noted:   Doesn't like taking more meds.  Spending a couple hours daily with OCD.  No SI No med changes.  02/21/22 appt noted: Patient received  Spravato  84 mg today.  She tolerated it well without any unusual headache, nausea or vomiting or other somatic symptoms.  Dissociation did occur and she gradually saw resolution over the 2-hour period of observation.  She does not typically find the dissociation very strong. No SE complaints with meds. She has chronic depesssion and OCD but is improved with Spravato , both dx versus before.  She has continued Luvox  400 mg and Wellbutrin  450 mg and is tolerating it.  Chronically easily overwhelmed and doesn't know why.  Tolerating all meds. Wants to continue meds.  03/16/22 appt noted: Patient received Spravato  84 mg today.  She tolerated it well without any unusual headache, nausea or vomiting or other somatic symptoms.  Dissociation did occur and she gradually saw resolution over the 2-hour period of observation.  She does not typically find the dissociation very strong. No SE complaints with meds. Overall she still feels the Spravato  has been helpful not only for her depression but also for her OCD which was somewhat unexpected.  OCD is still significant but it is less severe than prior to starting Spravato .  She is tolerating Luvox  400 mg and Wellbutrin  450 mg.  We discussed possible med adjustments.  03/23/22 appt noted: Patient received Spravato  84 mg today.  She tolerated it well without any unusual headache, nausea or vomiting or other somatic symptoms.  Dissociation did occur and she gradually saw resolution over the 2-hour period of observation.  She does not typically find the dissociation very strong. No SE complaints with meds. She is still depressed and still has OCD of course but is improved with the Spravato .  She is tolerating the medications well.  We had previously discussed the possibility of switching some of the Wellbutrin  to [place]vel[place]  and she is very interested in that in hopes of further improvement in depression and OCD.  She understands that Auvelity  is not used for OCD on the label.   She is tolerating the medications.  She is still easily overwhelmed.  She is sleeping and eating okay.. Plan: Reduce Wellbutrin  XL to 300 mg AM and add Auvelity  1 tablet each AM  03/30/22 appt noted: Patient received Spravato  84 mg today.  She tolerated it well without any unusual headache, nausea or vomiting or other somatic symptoms.  Dissociation did occur and she gradually saw resolution over the 2-hour period of observation.  She does not typically find the dissociation very strong. No SE complaints with meds. She is still depressed and still has OCD of course but is improved with the Spravato .  She is tolerating the medications well.  No difference with Auvelity  1 AM so far and no SE.  Going on vacation on Saturday. 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 Sleep and appetite good. Plan: Increase Auvelity   to 1 twice daily and reduce Wellbutrin  to XL 150 every morning  04/14/2022 appointment with the following noted: Patient received Spravato  84 mg today.  She tolerated it well without any unusual headache, nausea or vomiting or other somatic symptoms.  Dissociation did occur and she gradually saw resolution over the 2-hour period of observation.  She does not typically find the dissociation very strong. No SE complaints with meds. She is still depressed and still has OCD of course but is improved with the Spravato .  She is tolerating the medications well.  Just increased Auvelity  to BID yesterday and reduced Wellbutrin  to 150 AM. No SE so far.  No change in mood or anxiety so far.  Chronic OCD as noted and residual depression and chronic fatigue.  04/21/2022 appointment with the following noted: Patient received Spravato  84 mg today.  She tolerated it well without any unusual headache, nausea or vomiting or other somatic symptoms.  Dissociation did occur and she gradually saw resolution over the 2-hour period of observation.  She does not typically find the dissociation very strong. No  SE complaints with meds. She is still depressed and still has OCD of course but is improved with the Spravato .   She has questions about the dosing of lorazepam . She tends to have negative anxious thoughts at night.  This tends to interfere with her ability to go to sleep.  She is getting about 8 to 9 hours of sleep.  She is tolerating the meds without excessive sedation and does not nap during the day.  05/06/22 appt noted: Patient received Spravato  84 mg today.  She tolerated it well without any unusual headache, nausea or vomiting or other somatic symptoms.  Dissociation did occur and she gradually saw resolution over the 2-hour period of observation.  She does not typically find the dissociation very strong. No SE complaints with meds. She is still depressed and still has OCD of course but is improved with the Spravato .   Had some questions about timing of dosing of fluvoxamine  and Auvelity . OCD is not quite as time consuming.  Sleep and eating are the same.   No SE meds.  05/25/22 appt noted: Patient received Spravato  84 mg today.  She tolerated it well without any unusual headache, nausea or vomiting or other somatic symptoms.  Dissociation did occur and she gradually saw resolution over the 2-hour period of observation.  She does not typically find the dissociation very strong. No SE complaints with meds. She is still depressed and still has OCD of course but is improved with the Spravato .   She has less OCD when away from home and on vacation of note. Plan: Rec gradually reduce HS lorazepam  to 1 mg Hs.  Can continue lorazepam  2 mg AM and 1 mg in afternoon bc of chronic anxiety and it is helpful and tolerated. She can continue temazepam  30 mg nightly.  She tends to have a lot of anxious negative thoughts at night when she is trying to go to bed  06/16/22 appt noted: Patient received Spravato  84 mg today.  She tolerated it well without any unusual headache, nausea or vomiting or other somatic  symptoms.  Dissociation did occur and she gradually saw resolution over the 2-hour period of observation.  She does not typically find the dissociation very strong. No SE complaints with meds. She is still depressed and still has OCD of course but is improved with the Spravato .   She has less OCD when away from home and  on vacation of note. She is tolerating the medications.  She has continued current medications. 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  She still receives benefit from Spravato .  Her depression and OCD are better with the Spravato .  She thinks she is getting additional benefit with the switch from Wellbutrin  to Auvelity .  07/04/2022 appointment noted: Reports she developed a rash on her face from Auvelity  and feels like she is allergic to it.  She stopped it and went back to Wellbutrin  450 mg every morning.  The rash has cleared up.  She did not require any medical attention and did not have shortness of breath. Overall her depression and OCD are about the same as they have been.  She did not notice a substantial difference from the brief treatment with Auvelity  but she understands she did not take a full course.  She is tolerating the current medicines well. Current meds fluvoxamine  400 mg daily, Wellbutrin  XL 450 mg daily, lorazepam  1 to 2 mg in the morning and 1 to 2 mg at night and 1 mg in the afternoon, temazepam  30 mg nightly. She wants to continue the Spravato  because she feels it has been helpful for both her depression and her racing OCD  07/18/22 appt noted: Patient received Spravato  84 mg today.  She tolerated it well without any unusual headache, nausea or vomiting or other somatic  symptoms.  Dissociation did occur and she gradually saw resolution over the 2-hour period of observation.  She does not typically find the dissociation very strong. No SE complaints with meds. She is still depressed and still has OCD of course but is improved with the Spravato .  Rash better off Auvelity  and back on Welllbutrin XL 450 mg AM, fluvoxamine  400 mg daily.  08/15/22 appt noted: Current psych meds: Wellbutrin  XL 450 mg AM, fluvoxamine  100 mg in AM and 300 mg HS, lorazepam  1 mg 1-2 mg in the AM and HS and 1 tablet prn midday for anxiety, temazepam  30 mg HS Patient received Spravato  84 mg today.  She tolerated it well without any unusual headache, nausea or vomiting or other somatic symptoms.  Dissociation did occur and she gradually saw resolution over the 2-hour period of observation.  She does not typically find the dissociation very strong. No SE complaints with meds. She has a great deal of stress dealing with her family.  Disc brother's ongoing mania and difficulty getting him help and the stress he causes for the family. She wants to continue Spravato  through this very stressful holdicay season and reevaluate the frequency after the New Year.  09/12/22 appt noted: Current psych meds: Wellbutrin  XL 450 mg AM, fluvoxamine  100 mg in AM and 300 mg HS, lorazepam  1 mg 1-2 mg in the AM and HS and 1 tablet prn midday for anxiety, temazepam  30 mg HS Patient received Spravato  84 mg today.  She tolerated it well without any unusual headache, nausea or vomiting or other somatic symptoms.  Dissociation did occur and she gradually saw resolution over the 2-hour period of observation.  She does not typically find the dissociation very strong. No SE complaints with meds. She has a great deal of stress  dealing with her family.  Disc brother's ongoing mania and difficulty getting him help and the stress he causes for the family. She wants to continue Spravato  through this very stressful holdicay season  and reevaluate the frequency after the New Year.  Chronically easily overwhelmed with family. Complaining of HA and history migraine.  Asks for increase imitrex  and disc preventatives like propranolol  ER  09/26/22 appt noted: Current psych meds: Wellbutrin  XL 450 mg AM, fluvoxamine  100 mg in AM and 300 mg HS, lorazepam  1 mg 1-2 mg in the AM and HS and 1 tablet prn midday for anxiety, temazepam  30 mg HS Patient received Spravato  84 mg today.  She tolerated it well without any unusual headache, nausea or vomiting or other somatic symptoms.  Dissociation did occur and she gradually saw resolution over the 2-hour period of observation.  She does not typically find the dissociation very strong. No SE complaints with meds. She has a great deal of stress dealing with her family.  This is ongoing The holidays are much more stressful DT family problems.  She is noting OCD is much worse over the last couple of week.  Depression is better with Spravato . Needed higher dose meds for migraine.   10/03/22 appt noted: Current psych meds: Wellbutrin  XL 450 mg AM, fluvoxamine  100 mg in AM and 300 mg HS, lorazepam  1 mg 1-2 mg in the AM and HS and 1 tablet prn midday for anxiety, temazepam  30 mg HS Patient received Spravato  84 mg today.  She tolerated it well without any unusual headache, nausea or vomiting or other somatic symptoms.  Dissociation did occur and she gradually saw resolution over the 2-hour period of observation.  She does not typically find the dissociation very strong. No SE complaints with meds. She has now realized that the rash she previously previously attributed to Auvelity  was not related.  She is interested may be retrying that after the holidays.  She is tolerating medications otherwise. The holidays remain chronically stressful to her due to family dynamic problems which cause her to consistently feel stuck.  Under more stress her OCD is worse.  She will have a tendency to have crying spells.   The depression and OCD are still improved with Spravato  as compared to before.  11/15/22 appt noted: Current psych meds: Wellbutrin  XL 450 mg AM, fluvoxamine  100 mg in AM and 300 mg HS, lorazepam  1 mg 1-2 mg in the AM and HS and 1 tablet prn midday for anxiety, temazepam  30 mg HS Patient received Spravato  84 mg today.  She tolerated it well without any unusual headache, nausea or vomiting or other somatic symptoms.  Dissociation did occur and she gradually saw resolution over the 2-hour period of observation.  She does not typically find the dissociation very strong. No SE complaints with meds. Continues to feel depressed and overwhelmed by family problems including her brother's mania and recent eviction and commitment.  Chronic OCD worse when stressed.  No SI.  Tolerating meds. Plan: Per her request continue Wellbutrin  XL 450 mg every morning. She has come to the realization that the rash she had previously attributed to Auvelity  was not related.  She is interested in perhaps retrying Auvelity . There are few alternative medication options that remain.  01/11/23 appt noted: Current psych meds: Wellbutrin  XL 150 mg BID and started Auvelity  1 AM, fluvoxamine  100 mg in AM and 300 mg HS, lorazepam  1 mg 1-2 mg in the AM and HS and 1 tablet prn midday for  anxiety, temazepam  30 mg HS Patient received Spravato  84 mg today.  She tolerated it well without any unusual headache, nausea or vomiting or other somatic symptoms.  Dissociation did occur and she gradually saw resolution over the 2-hour period of observation.  She does not typically find the dissociation very strong. No SE complaints with meds. Trouble getting to sessions lately DT transportation problems.   Continues to feel depressed and overwhelmed by OCD and family.  Feels she needs toevery other week Spravato  bc it helps for a couple of weeks and then seems to wear off.  Struggleing with OCD and depression both of which are eased by Spravato . Less  crying with Auvelity .  02/07/23 appt noted: Current psych meds: Wellbutrin  XL 150 mg BID and started Auvelity  1 AM, fluvoxamine  100 mg in AM and 300 mg HS, lorazepam  1 mg 1-2 mg in the AM and HS and 1 tablet prn midday for anxiety, temazepam  30 mg HS Patient received Spravato  84 mg today.  She tolerated it well without any unusual headache, nausea or vomiting or other somatic symptoms.  Dissociation did occur and she gradually saw resolution over the 2-hour period of observation.  She does not typically find the dissociation very strong. No SE complaints with meds. Trouble getting to sessions lately DT transportation problems.  This is a problem ongoing and thinks she might need to pause Spravato  bc won't be able to get her for at least 3 weeks. She is holding pretty steady with a moderate level of anxiety and depression ongoing and chronic.    03/20/23 appt noted: Current psych meds: Wellbutrin  XL 150 mg BID and started Auvelity  1 AM, fluvoxamine  100 mg in AM and 300 mg HS, lorazepam  1 mg 1-2 mg in the AM and HS and 1 tablet prn midday for anxiety, temazepam  30 mg HS Patient received Spravato  84 mg today.  She tolerated it well without any unusual headache, nausea or vomiting or other somatic symptoms.  Dissociation did occur and she gradually saw resolution over the 2-hour period of observation.  She does not typically find the dissociation very strong. No SE complaints with meds. She is trying to get back into more regular Spravato  administration.  Family issues and transportation problems that led to her missing Spravato .  She feels more depressed without regular Spravato .  Her OCD is chronic but also worse when she misses Spravato .  She does not want any medication changes.  04/03/23 appt noted: Current psych meds: Wellbutrin  XL 150 mg BID and started Auvelity  1 AM, fluvoxamine  100 mg in AM and 300 mg HS, lorazepam  1 mg 1-2 mg in the AM and HS and 1 tablet prn midday for anxiety, temazepam  30 mg  HS Patient received Spravato  84 mg today.  She tolerated it well without any unusual headache, nausea or vomiting or other somatic symptoms.  Dissociation did occur and she gradually saw resolution over the 2-hour period of observation.  She does not typically find the dissociation very strong.  It gets rid of negative emotion for awhile after procedure and would like it to last longer.   No SE complaints with meds.  Doesn't really want med changes.   Planning to weekly Spravato .  It is helping dep and anxiety.    04/10/23 appt noted: Current psych meds: Wellbutrin  XL 150 mg BID and started Auvelity  1 AM, fluvoxamine  100 mg in AM and 300 mg HS, lorazepam  1 mg 1-2 mg in the AM and HS and 1 tablet prn midday  for anxiety, temazepam  30 mg HS Patient received Spravato  84 mg today.  She tolerated it well without any unusual headache, nausea or vomiting or other somatic symptoms.  Dissociation did occur and she gradually saw resolution over the 2-hour period of observation.  She does not typically find the dissociation very strong.  It gets rid of negative emotion for awhile after procedure and would like it to last longer.   No SE complaints with meds.  Doesn't really want med changes.   Had lidocaine  shot for back pain with brief benefit.  Doing some water based PT Spravato  went well today. Got pretty upset last week with event related to son's activities.  Got upset with son saying something inappropriate publicly.  Was so embarrassed.  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    He's kind of rebellious lately.  Son is 62 yo.    05/03/23 appt noted: Current psych meds: Wellbutrin  XL 150 mg BID and started Auvelity  1 AM, fluvoxamine  100 mg in AM and 300 mg HS, lorazepam  1 mg 1-2 mg in the AM and HS and 1 tablet prn midday for anxiety, temazepam  30 mg HS No SE Patient received Spravato  84 mg today.  She tolerated it well without any unusual headache, nausea or  vomiting or other somatic symptoms.  Dissociation did occur and she gradually saw resolution over the 2-hour period of observation.  She does not typically find the dissociation very strong.  It gets rid of negative emotion for awhile after procedure and would like it to last longer.   Still pleased with benefit from Spravato  for mood and anxiety. No SE complaints with meds.  Doesn't really want med changes.   Chronic family px ongoing affects her but nothing she can change.H sick of the family drama.   Continuing counseling helps.  Has some support. Mood and anxiety pretty steady but did go on vacation to [place].  Anxiety worse first in AM and then later at night with mind racing on stressors.   Still back trouble.  05/23/23 appt noted: Current psych meds: Wellbutrin  XL 150 mg BID and started Auvelity  1 AM, fluvoxamine  100 mg in AM and 300 mg HS, lorazepam  1 mg 1-2 mg in the AM and HS and 1 tablet prn midday for anxiety, temazepam  30 mg HS No SE Patient received Spravato  84 mg today.  She tolerated it well without any unusual headache, nausea or vomiting or other somatic symptoms.  Dissociation did occur and she gradually saw resolution over the 2-hour period of observation.  She does not typically find the dissociation very strong.  It gets rid of negative emotion for awhile after procedure and would like it to last longer.   Still pleased with benefit from Spravato  for mood and anxiety by 50%. No SE complaints with meds. Chronic family stress interferes with mental health and self care.  May have to reduce frequency of Spravato  bc of this. But is status quo with meds.  Chronic residual OCD which is mod severe and dep moderate.  05/30/23 appt noted: Current psych meds: Wellbutrin  XL 150 mg BID and started Auvelity  1 AM, fluvoxamine  100 mg in AM and 300 mg HS, lorazepam  1 mg 1-2 mg in the AM and HS and 1 tablet prn midday for anxiety, temazepam  30 mg HS No SE Patient received Spravato  84 mg  today.  She tolerated it well without any unusual headache, nausea or vomiting  or other somatic symptoms.  Dissociation did occur and she gradually saw resolution over the 2-hour period of observation.  She does not typically find the dissociation very strong.  It gets rid of negative emotion for awhile after procedure and would like it to last longer.   Still pleased with benefit from Spravato  for mood and anxiety by 50% or better but not resolved. She wants to continue Spravato  as frequently as schedule will allow. Both depression and OCD are better with most improvement in mood.  Asks about any new treatments for OCD. No SE complaints with meds. Chronic family stress interferes with mental health and self care.  This is an ongoing drain on her mood and resources emotionally.    06/06/23 appt noted: Current psych meds: Wellbutrin  XL 150 mg BID and started Auvelity  1 AM, fluvoxamine  100 mg in AM and 300 mg HS, lorazepam  1 mg 1-2 mg in the AM and HS and 1 tablet prn midday for anxiety, temazepam  30 mg HS No SE Patient received Spravato  84 mg today.  She tolerated it well without any unusual headache, nausea or vomiting or other somatic symptoms.  Dissociation did occur and she gradually saw resolution over the 2-hour period of observation.  She does not typically find the dissociation very strong.  It gets rid of negative emotion for awhile after procedure and would like it to last longer.   Still believes each meds work and are helpful and well tolerated.  Specifically she thinks that the Auvelity  adds additional benefit on taking Wellbutrin .  She believes the meds help both depression and OCD though the OCD remains a problem.  She also believes Spravato  helps both types of symptoms as well.  Her mood is variable.  She experiences a great deal of stress from her family and those problems wax and wane.  She has bad days because of it and today is 1 of those days.  She is continuing counseling and that is  helpful.  Due to family demands she may have to spread out the Spravato  frequency.  06/16/23 appt noted:  Current psych meds: Wellbutrin  XL 150 mg BID and started Auvelity  1 AM, fluvoxamine  100 mg in AM and 300 mg HS, lorazepam  1 mg 1-2 mg in the AM and HS and 1 tablet prn midday for anxiety, temazepam  30 mg HS No SE Patient received Spravato  84 mg today.  She tolerated it well without any unusual headache, nausea or vomiting or other somatic symptoms.  Dissociation did occur and she gradually saw resolution over the 2-hour period of observation.  She does not typically find the dissociation very strong.  It gets rid of negative emotion for awhile after procedure . She is trying to make the Schedule work for Spravto oh every week or every other week because she is aware the treatment effects can be lost if it is time less frequently.  So she has been able to come the last 2 weeks, we will try to come in 2 weeks.  Spravato  reduces depression and OCD significantly though she remains highly symptomatic with both.  However she has no suicidal thoughts.  Crying spells are reduced with Spravato .  She still spends a fair amount of time meaning over an hour a day checking and more under stress or when tired.  She asks about any new meds for OCD. Plan: increase Auvelity  1 in AM & PM Hold Wellbutrin  XL 150 mg BID  07/12/23 appt noted: Current psych meds: Wellbutrin  XL 150  mg BID and started Auvelity  1 AM, fluvoxamine  100 mg in AM and 300 mg HS, lorazepam  1 mg 1-2 mg in the AM and HS and 1 tablet prn midday for anxiety, temazepam  30 mg HS.  Didn't increase Auvelity  yet No SE Patient received Spravato  84 mg today.  She tolerated it well without any unusual headache, nausea or vomiting or other somatic symptoms.  Dissociation did occur and she gradually saw resolution over the 2-hour period of observation.  She does not typically find the dissociation very strong.  It gets rid of negative emotion for awhile after  procedure . She has not increased the Auvelity  yet is planned.  She has a little bit of nervousness about it but admits is not rational.  She is willing to give that a try to try to get better control of depression and anxiety.  She continues to see the benefit of Spravato  every other week.  She is struggling with some pain due to plantar fasciitis and given her cerebral palsy that makes it even more difficult to walk and to engage in normal activities.  She is willing to seek treatment for that.  07/25/23 appt noted: Current psych meds: Wellbutrin  XL 150 mg AM and started Auvelity  1 BID, fluvoxamine  100 mg in AM and 300 mg HS, lorazepam  1 mg 1-2 mg in the AM and HS and 1 tablet prn midday for anxiety, temazepam  30 mg HS.  Didn't increase Auvelity  yet No SE Patient received Spravato  84 mg today.  She tolerated it well without any unusual headache, nausea or vomiting or other somatic symptoms.  Dissociation did occur and she gradually saw resolution over the 2-hour period of observation.  She does not typically find the dissociation very strong.  It gets rid of negative emotion for awhile after procedure . Chronic dep and anxiety to some extent.  But has increased Auvelity  to BID recently and no SE yet.  Disc frequency Spravato  bc benefit and will try to continue every other week and not less if possible. No further concerns for meds.  08/08/23 appt noted: Current psych meds: Wellbutrin  XL 150 mg AM and Auvelity  1 BID, fluvoxamine  100 mg in AM and 300 mg HS, lorazepam  1 mg 1-2 mg in the AM and HS and 1 tablet prn midday for anxiety, temazepam  30 mg HS.  Didn't increase Auvelity  yet No SE with med changes. Patient received Spravato  84 mg today.  She tolerated it well without any unusual headache, nausea or vomiting or other somatic symptoms.  Dissociation did occur and she gradually saw resolution over the 2-hour period of observation.  She does not typically find the dissociation very strong.  It gets rid  of negative emotion for awhile after procedure . Mornings still worse with dep and anxiety.  But mood and anxiety are better with the increase in Auvelity  to BID.  OCD seems a little better.  Chronic difficulty handling the complaints and stress from her family.  Easily stresser her out.  No med changes desire. She will have to take a break from Spravato  DT pending foot surgery.  Mobility and transportation will be limited.  08/25/23 appt noted: Current psych meds: Wellbutrin  XL 150 mg AM and Auvelity  1 BID, fluvoxamine  100 mg in AM and 300 mg HS, lorazepam  1 mg 1-2 mg in the AM and HS and 1 tablet prn midday for anxiety, temazepam  30 mg HS.  Didn't increase Auvelity  yet No SE with med changes. Patient received Spravato  84 mg today.  She tolerated it well without any unusual headache, nausea or vomiting or other somatic symptoms.  Dissociation did occur and she gradually saw resolution over the 2-hour period of observation.  She does not typically find the dissociation very strong.  It gets rid of negative emotion for awhile after procedure . Pending foot surgery next week.  Able to work in [employer]  this week which helps with dep and anxiety and OCD.  Struggles with sleep without multiple meds as noted.  No adverse effects.  Will return to [employer]  asap after foot surgery  10/04/23 appt noted: Current psych meds: Wellbutrin  XL 150 mg AM and Auvelity  1 BID, fluvoxamine  100 mg in AM and 300 mg HS, lorazepam  1 mg 1-2 mg in the AM and HS and 1 tablet prn midday for anxiety, temazepam  30 mg HS.  Didn't increase Auvelity  yet No SE with med changes. Patient received Spravato  84 mg today.  She tolerated it well without any unusual headache, nausea or vomiting or other somatic symptoms.  Dissociation did occur and she gradually saw resolution over the 2-hour period of observation.  She does not typically find the dissociation very strong.  It gets rid of negative emotion for awhile after procedure . Mood and  anxiety stable.  Benefit with meds and [place] .   She reports OCD time consumption typically is much better than in the past when it was routinely 2 hours daily.  Now less than have of that.    Chronic caregiver stress with dysfunctional family and has trouble with boundaries.  Working on that in therapy.  10/24/23 appt noted: Current psych meds: Wellbutrin  XL 150 mg AM and Auvelity  1 BID, fluvoxamine  100 mg in AM and 300 mg HS, lorazepam  1 mg 1-2 mg in the AM and HS and 1 tablet prn midday for anxiety, temazepam  30 mg HS.  No SE with med changes. She plans to stop [place]  bc couldn't get adequate transportation.  Her mood is continuing to be highly reactive with what's going on with family esp mother and brother.  Mother self absorbed and can be mean yet demanding.   She doesn't fear getting worse if she stops [place]  though this has happened in the past.   Lately less dep though can be pulled down quickly by family of origin interactions.  Narcissistic mother.  Thinking of PT work to try to keep her mind more occupied. Plan no changes  01/15/24 appt noted: Current psych meds: Wellbutrin  XL 150 mg AM and Auvelity  1 BID, fluvoxamine  100 mg in AM and 300 mg HS, lorazepam  1 mg 1-2 mg in the AM and HS and 1 tablet prn midday for anxiety, temazepam  30 mg HS. No SE with med changes. Patient received Spravato  84 mg today.  She tolerated it well without any unusual headache, nausea or vomiting or other somatic symptoms.  Dissociation did occur and she gradually saw resolution over the 2-hour period of observation.  She does not typically find the dissociation very strong.  It gets rid of negative emotion for awhile after procedure . She wanted to resume [place]  bc 30% benefit for dep and anxiety.  Chronic struggles with OCD and family stress and handles it better when on [place] . No problems with meds and no desire for changes.  01/16/24 appt noted: Current psych meds: Wellbutrin  XL 150 mg AM and  Auvelity  1 BID, fluvoxamine  100 mg in AM and 300 mg HS, lorazepam  1 mg 1-2 mg in the AM and HS and 1 tablet prn  midday for anxiety, temazepam  30 mg HS. No SE with med changes. Patient received Spravato  84 mg today.  She tolerated it well without any unusual headache, nausea or vomiting or other somatic symptoms.  Dissociation did occur and she gradually saw resolution over the 2-hour period of observation.  She does not typically find the dissociation very strong.  It gets rid of negative emotion for awhile after procedure . She wanted to resume [place]  bc 30% benefit for dep and anxiety.  Chronic struggles with OCD and family stress and handles it better when on [place] . Asks about dropping out of the Wellbutrin .  01/23/24 appt noted: Current psych meds: Wellbutrin  XL 150 mg AM and Auvelity  1 BID, fluvoxamine  100 mg in AM and 300 mg HS, lorazepam  1 mg 1-2 mg in the AM and HS and 1 tablet prn midday for anxiety, temazepam  30 mg HS. No SE with med changes. Patient received Spravato  84 mg today.  She tolerated it well without any unusual headache, nausea or vomiting or other somatic symptoms.  Dissociation did occur and she gradually saw resolution over the 2-hour period of observation.  She does not typically find the dissociation very strong.  It gets rid of negative emotion for awhile after procedure . She wanted to resume Sprav[place]  bc 30% benefit for dep and anxiety.  Chronic struggles with OCD and family stress and handles it better when on [place] . She is getting benefit from the [employer]rav[place]  for depression and anxiety Plan no med changes  02/13/24 appt noted: Current psych meds: Wellbutrin  XL 150 mg AM and Auvelity  1 BID, fluvoxamine  100 mg in AM and 300 mg HS, lorazepam  1 mg 1-2 mg in the AM and HS and 1 tablet prn midday for anxiety, temazepam  30 mg HS. No SE with med changes. Patient received Spravato  84 mg today.  She tolerated it well without any unusual headache, nausea or vomiting or other  somatic symptoms.  Dissociation did occur and she gradually saw resolution over the 2-hour period of observation.  She does not typically find the dissociation very strong.  It gets rid of negative emotion for awhile after procedure . She continues to note considerable improvement in depression and anxiety when she misses provide a treatment on an every 2-week basis which is much as she can arrange given her transportation issues.  She is chronically stressed by her dysfunctional family of origin.  She recognizes she does not handle distressed easily.  She states benefit from the medication and does not want any changes  02/27/24 appt noted:  Current psych meds: Wellbutrin  XL 150 mg AM and Auvelity  1 BID, fluvoxamine  100 mg in AM and 300 mg HS, lorazepam  1 mg 1-2 mg in the AM and HS and 1 tablet prn midday for anxiety, temazepam  30 mg HS. No SE nor desire for med changes. Patient received Spravato  84 mg today.  She tolerated it well without any unusual headache, nausea or vomiting or other somatic symptoms.  Dissociation did occur and she gradually saw resolution over the 2-hour period of observation.  She does not typically find the dissociation very strong.  It gets rid of negative emotion for awhile after procedure . Able to leave without assistance. Benefit Spravato  for mood and OCD and wants to continue.  No desire for med change.  Ongoing stressors with family.  OCD is chronic.    03/12/2024 appointment noted: Current psych meds: Wellbutrin  XL 150 mg AM and Auvelity  1 BID, fluvoxamine  100 mg in AM and  300 mg HS, lorazepam  1 mg 1-2 mg in the AM and HS and 1 tablet prn midday for anxiety, temazepam  30 mg HS. No SE nor desire for med changes. Patient received Spravato  84 mg today.  She tolerated it well without any unusual headache, nausea or vomiting or other somatic symptoms.  Dissociation did occur and she gradually saw resolution over the 2-hour period of observation.  She does not typically find  the dissociation very strong.  No med changes desired. Continues to receive benefit from [employer]rav[place]  and reducing depression and helping to moderate the OCD.  Specifically she is 50% less depressed and has about 25 to 30% less OCD using [employer] .  She is chronically stressed with a dysfunctional family.  She is working on boundary issues with her therapist.  03/26/24 appt noted: Current psych meds: Wellbutrin  XL 150 mg AM and Auvelity  1 BID, fluvoxamine  100 mg in AM and 300 mg HS, lorazepam  1 mg 1-2 mg in the AM and HS and 1 tablet prn midday for anxiety, temazepam  30 mg HS. No SE nor desire for med changes. Patient received Spravato  84 mg today.  She tolerated it well without any unusual headache, nausea or vomiting or other somatic symptoms.  Dissociation did occur and she gradually saw resolution over the 2-hour period of observation.  She does not typically find the dissociation very strong.  No med changes desired.  03/26/24 appt noted: Current psych meds: Wellbutrin  XL 150 mg AM and Auvelity  1 BID, fluvoxamine  100 mg in AM and 300 mg HS, lorazepam  1 mg 1-2 mg in the AM and HS and 1 tablet prn midday for anxiety, temazepam  30 mg HS. No SE nor desire for med changes. Patient received Spravato  84 mg today.  She tolerated it well without any unusual headache, nausea or vomiting or other somatic symptoms.  Dissociation did occur and she gradually saw resolution over the 2-hour period of observation.  She does not typically find the dissociation very strong.   Missed a couple of days of fluvoxamine  and had relapse worsening OCD.  It had been more under control for weeks priot to this past weekend.  Also struggling to keep up [employer]rav[employer]  which is helpful for OCD and dep.  Schedule px are ongoing.  06/03/24 appt noted:  Current psych meds: Wellbutrin  XL 150 mg AM and Auvelity  1 BID, fluvoxamine  100 mg in AM and 300 mg HS, lorazepam  1 mg 1-2 mg in the AM and HS and 1 tablet prn midday for anxiety, temazepam  30 mg  HS. No SE nor desire for med changes. She has put a hold on [place]  for now due to transportation problems. Most days took all 5 lorazepam .  One day forgot. Has been thinking about resuming [place]  back to every other week.  Situation with family is the same.  It's really hard.  When on Spravato  gets more relief.  Otherwise tends to dwell on catastrophic things.  She was not coming for [place]rav[place]  DT transportation issues.  She thinks she needs to try to work it out.   Previous psych med trials include  Prozac, paroxetine, sertraline, fluvoxamine , venlafaxine, Anafranil  with no response,  Wellbutrin , Viibryd, Trintellix 10 1 month NR Auvelity  BID   Geodon,  risperidone, Rexulti, Abilify,  Seroquel, Latuda 40 mg with irritability.   lamotrigine lithium,  BuSpar, Namenda,  pramipexole with no response, and Topamax, pindolol  2 brothers & 1 sister poverty, 1 B with untreated bipolar disorder lives with mother  JACQUELYNNE  Flowsheet Row Office Visit from 06/29/2021 in [facility]  MADRS Total Score 36   Flowsheet Row Admission (Discharged) from 06/11/2021 in [facility]  C-SSRS RISK CATEGORY No Risk    Review of Systems:  Review of Systems  Constitutional:  Positive for fatigue.  Respiratory:  Positive for cough.   Cardiovascular:  Negative for palpitations.  Musculoskeletal:  Positive for arthralgias, back pain, gait problem, myalgias and neck pain.  Neurological:  Positive for weakness and headaches. Negative for tremors.  Psychiatric/Behavioral:  Positive for dysphoric mood and sleep disturbance. Negative for suicidal ideas. The patient is nervous/anxious.     Medications: I have reviewed the patient's current medications.  Current Outpatient Medications  Medication Sig Dispense Refill   Abaloparatide (TYMLOS) 3120 MCG/1.56ML SOPN Inject into the skin.     AUVELITY  45-105 MG TBCR TAKE 1 TABLET BY MOUTH IN THE MORNING AND IN THE  EVENING 60 tablet 1   Azelastine-Fluticasone  137-50 MCG/ACT SUSP Place 1-2 sprays into both nostrils daily.     baclofen  (LIORESAL ) 10 MG tablet Take 20 mg by mouth at bedtime as needed for muscle spasms.     buPROPion  (WELLBUTRIN  XL) 150 MG 24 hr tablet TAKE 1 TABLET (150 MG TOTAL) BY MOUTH IN THE MORNING 30 tablet 2   clomiPRAMINE  (ANAFRANIL ) 25 MG capsule Take 1 capsule (25 mg total) by mouth at bedtime. 30 capsule 1   dicyclomine (BENTYL) 10 MG capsule Take 10 mg by mouth daily.     docusate sodium  (COLACE) 100 MG capsule Take 1 capsule (100 mg total) by mouth 2 (two) times daily. (Patient taking differently: Take 100 mg by mouth daily.) 10 capsule 0   fexofenadine (ALLEGRA) 180 MG tablet Take 180 mg by mouth daily.     fluvoxaMINE  (LUVOX ) 100 MG tablet TAKE 1 TABLET IN THE AM AND 3 TABLETS AT NIGHT 120 tablet 2   hydrocortisone  (ANUSOL -HC) 2.5 % rectal cream Place rectally 2 (two) times daily. x 7-14 days 30 g 0   ketotifen  (ZADITOR ) 0.025 % ophthalmic solution Place 3 drops into both eyes at bedtime.     LORazepam  (ATIVAN ) 1 MG tablet TAKE 1-2 TABS EVERY MORNING, 1-2 TABS AT BEDTIME & 1 TAB IN THE AFTERNOON WHEN NEEDED -ANXIETY/SLEEP 150 tablet 1   magnesium gluconate (MAGONATE) 500 MG tablet Take 500 mg by mouth daily.     meloxicam  (MOBIC ) 15 MG tablet Take 1 tablet (15 mg total) by mouth daily. 30 tablet 0   meloxicam  (MOBIC ) 15 MG tablet TAKE 1 TABLET (15 MG TOTAL) BY MOUTH DAILY. 30 tablet 0   MIBELAS 24 FE 1-20 MG-MCG(24) CHEW Chew 1 tablet by mouth at bedtime as needed (bowel regularity).     Multiple Vitamins-Minerals (ADULT GUMMY PO) Take 2 tablets by mouth in the morning.     nitrofurantoin (MACRODANTIN) 100 MG capsule Take 100 mg by mouth as needed (For urinary tract infection.).      polyethylene glycol (MIRALAX  / GLYCOLAX ) packet Take 17 g by mouth daily as needed for mild constipation. 14 each 0   propranolol  ER (INDERAL  LA) 60 MG 24 hr capsule TAKE 1 CAPSULE BY MOUTH EVERY DAY  30 capsule 2   psyllium (METAMUCIL) 58.6 % powder Take 1 packet by mouth daily as needed (constipation).     SUMAtriptan  (IMITREX ) 100 MG tablet Take 1 tablet (100 mg total) by mouth every 2 (two) hours as needed for migraine. May repeat in 2 hours if headache persists or  recurs. 12 tablet 2   temazepam  (RESTORIL ) 30 MG capsule TAKE 1 CAPSULE BY MOUTH AT BEDTIME AS NEEDED FOR SLEEP 30 capsule 1   Vitamin D-Vitamin K (VITAMIN K2-VITAMIN D3 PO) Take 1-2 sprays by mouth daily.     Esketamine HCl, 84 MG Dose, (SPRAVATO , 84 MG DOSE,) 28 MG/DEVICE SOPK USE 3 SPRAYS IN EACH NOSTRIL ONCE A WEEK (Patient not taking: Reported on 06/03/2024) 3 each 1   methylPREDNISolone  (MEDROL  DOSEPAK) 4 MG TBPK tablet Take as directed (Patient not taking: Reported on 06/03/2024) 21 each 0   oxyCODONE -acetaminophen  (PERCOCET/ROXICET) 5-325 MG tablet Take 1-2 tablets by mouth every 6 (six) hours as needed for severe pain. (Patient not taking: Reported on 06/03/2024) 50 tablet 0   No current facility-administered medications for this visit.    Medication Side Effects: None   Allergies:  Allergies  Allergen Reactions   Hydrocodone  Itching   Sulfamethoxazole-Trimethoprim Itching   Dust Mite Extract Other (See Comments)    Sneezing, watery eyes, runny nose   Latex Itching   Other Other (See Comments)    PT IS ALLERGIC TO CAT DANDER AND RAGWEED - Sneezing, watery eyes, runny nose    Pollen Extract Other (See Comments)    Sneezing, watery eyes, runny nose     Past Medical History:  Diagnosis Date   Abnormal Pap smear 2011   hpv/mild dysplasia,cin1   Anxiety    Cerebral palsy (HCC)    right arm/leg   Cystocele    Depression    Headache    Neuromuscular disorder (HCC)    Cerebral Palsy   OCD (obsessive compulsive disorder)    Osteoporosis    Uterine prolaps     Family History  Problem Relation Age of Onset   Cancer Father        skin AND LUNG   Alcohol abuse Sister        CRACK COCAINE    Social  History   Socioeconomic History   Marital status: Married    Spouse name: Not on file   Number of children: Not on file   Years of education: Not on file   Highest education level: Not on file  Occupational History   Not on file  Tobacco Use   Smoking status: Never   Smokeless tobacco: Never  Substance and Sexual Activity   Alcohol use: Not Currently    Comment: OCCASIONAL beer   Drug use: No   Sexual activity: Yes    Birth control/protection: Pill    Comment: LOESTRIN 24 FE  Other Topics Concern   Not on file  Social History Narrative   Not on file   Social Drivers of Health   Financial Resource Strain: Not on file  Food Insecurity: Not on file  Transportation Needs: Not on file  Physical Activity: Not on file  Stress: Not on file  Social Connections: Not on file  Intimate Partner Violence: Not on file    Past Medical History, Surgical history, Social history, and Family history were reviewed and updated as appropriate.   Please see review of systems for further details on the patient's review from today.   Objective:   Physical Exam:  LMP  (LMP Unknown)   Physical Exam Constitutional:      General: She is not in acute distress. Neurological:     Mental Status: She is alert and oriented to person, place, and time.     Cranial Nerves: No dysarthria.     Motor: Weakness present.  Gait: Gait abnormal.  Psychiatric:        Attention and Perception: Attention and perception normal.        Mood and Affect: Mood is anxious and depressed. Affect is not labile.        Speech: Speech normal. Speech is not rapid and pressured or slurred.        Behavior: Behavior normal. Behavior is cooperative.        Thought Content: Thought content normal. Thought content is not delusional. Thought content does not include homicidal or suicidal ideation. Thought content does not include suicidal plan.        Cognition and Memory: Cognition and memory normal. Cognition is not  impaired.        Judgment: Judgment normal.     Comments: Insight intact Ongoing OCD remains fairly severe but less anxious Checking compulsions now about 30 mins.   Chronic depression persistent but better than it was a year ago better than before Spravato        Lab Review:     Component Value Date/Time   NA 138 06/11/2021 0606   K 4.0 06/11/2021 0606   CL 107 06/11/2021 0606   CO2 26 06/11/2021 0606   GLUCOSE 90 06/11/2021 0606   BUN 18 06/11/2021 0606   CREATININE 0.81 06/11/2021 0606   CALCIUM 9.4 06/11/2021 0606   PROT 6.5 06/11/2021 0606   ALBUMIN  3.3 (L) 06/11/2021 0606   AST 17 06/11/2021 0606   ALT 14 06/11/2021 0606   ALKPHOS 141 (H) 06/11/2021 0606   BILITOT 0.2 (L) 06/11/2021 0606   GFRNONAA >60 06/11/2021 0606   GFRAA >60 07/09/2016 0438       Component Value Date/Time   WBC 5.8 06/11/2021 0606   RBC 4.12 06/11/2021 0606   HGB 12.5 06/11/2021 0606   HCT 39.7 06/11/2021 0606   PLT 299 06/11/2021 0606   MCV 96.4 06/11/2021 0606   MCH 30.3 06/11/2021 0606   MCHC 31.5 06/11/2021 0606   RDW 13.9 06/11/2021 0606   LYMPHSABS 1.9 06/11/2021 0606   MONOABS 0.5 06/11/2021 0606   EOSABS 0.1 06/11/2021 0606   BASOSABS 0.0 06/11/2021 0606    No results found for: POCLITH, LITHIUM   No results found for: PHENYTOIN, PHENOBARB, VALPROATE, CBMZ   .res Assessment: Plan:    Denim Start was seen today for follow-up, depression, anxiety, fatigue, stress and sleeping problem.  Diagnoses and all orders for this visit:  Recurrent major depression resistant to treatment (HCC) (r/o MDD+Dysthymia)  Mixed obsessional thoughts and acts -     clomiPRAMINE  (ANAFRANIL ) 25 MG capsule; Take 1 capsule (25 mg total) by mouth at bedtime.  Social anxiety disorder  Migraine without aura and without status migrainosus, not intractable  Insomnia due to mental condition     Both primary Dx of OCD and major depression are TR and marked.  Impaired function but  less so with Spravato  re: depression.SABRA   She was receiving Spravato  84 mg weekly and moderate improvement in the depression..  she feels it also helps OCD somewhat.  However still easily overwhelmed with low stress tolerance.  Family contributes to her anxiety and stress markedly and triggers mood changes  The OCD is improved with the increase in fluvoxamine  and with Spravato .  Was Spending up to 2 hours daily and checking compulsions on her worst days but better when she travels and also reduced to about 30 min when on Spravato . .  No new options for tx are evident.  She has been on higher doses of fluvoxamine  above the usual max of 400 mg daily in the past and finds it more helpful at the higher dose.    Disc SE.   She is tolerating the meds well  Continue  Luvox  back to 400 mg nightly as of January 2023. Disc dosing higher than usual.  She feels this is increase has helped more with OCD which remains chronically severe.  Disc even higher than usual dosing.   She had worsening sx after missing a couple of days out of town dosing.   Disc SE in detail and SSRI withdrawal sx.  Continue Auvelity  BID and reduced Wellbutrin  to 1 AM. It has helped and is tolerated.   We have discussed seizure risk that is possible using this combination but given severity of her symptoms she feels the risk is warranted.  Do not consider stopping this until she gets the full benefit of Spravato .  Disc dosing again. There are few other alternative medication options that remain.   Disc DDI issues.   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 She doesn't want to try this now.  We discussed the short-term risks associated with benzodiazepines including sedation and increased fall risk among others.  Discussed long-term side effect risk including dependence, potential withdrawal symptoms, and the potential eventual dose-related risk of dementia.  But recent studies from 2020 dispute this  association between benzodiazepines and dementia risk. Newer studies in 2020 do not support an association with dementia. Disc this is high dose and not ideal.  Also disc risk combining it with temazepam . Rec try gradually reduce HS lorazepam  to 1 mg Hs.  Can continue lorazepam  2 mg AM and 1 mg in afternoon bc of chronic anxiety and it is helpful and tolerated. She can continue temazepam  30 mg nightly.  She tends to have a lot of anxious negative thoughts at night when she is trying to go to bed.  She is trying to reduce the dose.  Complaining of HA and history migraine.  Asks for increase imitrex  and disc preventatives like propranolol  ER imitrex  to 100 mg prn migraine and propranolol  ER for migraine prevention.  Disc trasportation problems limiting access to Spravato .  Disc alternative options.   she will plan to return to Spravato  weekly for 1 month and then try to drop back to every other week.   More depressed without it.   continue Auvelity  1 in AM & PM continue Wellbutrin  XL 150 mg AM Fluvoxamine  100 mg AM and 300 mg PM above usual max bc med necessary & tolerated. Lorazepam  2 mg HS and prn 1 mg BID prn anxiety daily Temazepam  30 mg HS Propranolol  ER 60 mg daily Imitrex  prn migraine.  She wants to increase #12/month  TRIAL POTENTIATION WITH CLOMIPRAMINE  25 MG hs .  Could go higher.  Call in month if not better.  FU ASAP   Lorene Macintosh, MD, DFAPA  Please see After Visit Summary for patient specific instructions.  Future Appointments  Date Time Provider Department Center  06/04/2024  3:00 PM Marijean Charleston, PhD [facility] None  06/26/2024 11:00 AM Marijean Charleston, PhD [facility] None  07/08/2024 11:00 AM Marijean Charleston, PhD [facility] None  07/24/2024 11:00 AM Marijean Charleston, PhD [facility] None  08/08/2024 11:00 AM Cottle, Lorene KANDICE Raddle., MD [facility] None    No orders of the defined types were placed in this encounter.    -------------------------------

## 2024-06-04 ENCOUNTER — Ambulatory Visit: Admitting: Psychiatry

## 2024-06-04 ENCOUNTER — Ambulatory Visit (INDEPENDENT_AMBULATORY_CARE_PROVIDER_SITE_OTHER): Admitting: Psychiatry

## 2024-06-04 DIAGNOSIS — Z636 Dependent relative needing care at home: Secondary | ICD-10-CM

## 2024-06-04 DIAGNOSIS — F4010 Social phobia, unspecified: Secondary | ICD-10-CM | POA: Diagnosis not present

## 2024-06-04 DIAGNOSIS — F339 Major depressive disorder, recurrent, unspecified: Secondary | ICD-10-CM | POA: Diagnosis not present

## 2024-06-04 DIAGNOSIS — G809 Cerebral palsy, unspecified: Secondary | ICD-10-CM

## 2024-06-04 DIAGNOSIS — F5105 Insomnia due to other mental disorder: Secondary | ICD-10-CM

## 2024-06-04 DIAGNOSIS — F422 Mixed obsessional thoughts and acts: Secondary | ICD-10-CM | POA: Diagnosis not present

## 2024-06-04 DIAGNOSIS — Z638 Other specified problems related to primary support group: Secondary | ICD-10-CM

## 2024-06-04 NOTE — Progress Notes (Signed)
 Psychotherapy Progress Note [facility] Casey Kendall, PhD LP  Patient ID: Casey Diaz Casey Diaz)    MRN: [id number] Therapy format: Individual psychotherapy Date: 06/04/2024      Start: 3:15p     Stop: 4:03p     Time Spent: 48 min Location: In-person   Session narrative (presenting needs, interim history, self-report of stressors and symptoms, applications of prior therapy, status changes, and interventions made in session) Continuing major problem with catastrophizing -- marriage, Casey Diaz, 19yo cat's health (adopted at [employer] [name], 2 yrs since Casey Diaz died).  Also still having a lingering cough that started in [place] .  One helpful moment with Casey Diaz, actually, where she was tearful, he said he didn't know what to do, but then he offered to hold her, which she acknowledges was helpful.  Encouraged let him know that worked.  As for catastrophizing, advised she try writing down her thoughts, just narrating, to slow and objectify. C/o communication with Casey Diaz, which recently boils down to getting irritated by him verifying she wanted him to go to something with Casey Diaz despite his weariness from work.  Tends to trigger her agonizing and martyring, until she finds her way back to thoughts of leaving , or dying.  Refreshed advice to try to speak her feelings instead of ruminate on the ultimate relief scenario.  Bemoans again her father dying and feeling alone in life.  Redirected to focus on the things she can make a difference with, but if desired, let his imagined spirit speak to her.  Renewed motivation to find work.  Has a bid in for [employer], awaiting approval.  Affirmed and encouraged, agree she needs something to feel meaningfully engaged in the world, and some time spoken for that can be less prone to obsessions.  Seeking social support.  Discussed possibilities that may be available and encouraged to reach out to them.  Therapeutic modalities: Cognitive Behavioral  Therapy, Solution-Oriented/Positive Psychology, and Ego-Supportive  Mental Status/Observations:  Appearance:   Casual     Behavior:  Appropriate  Motor:  CP gait  Speech/Language:   Clear and Coherent  Affect:  Appropriate  Mood:  depressed and stressed  Thought process:  normal  Thought content:    Rumination  Sensory/Perceptual disturbances:    WNL  Orientation:  Fully oriented  Attention:  Good    Concentration:  Fair  Memory:  WNL  Insight:    Good  Judgment:   Variable  Impulse Control:  Fair   Risk Assessment: Danger to Self: No Self-injurious Behavior: No Danger to Others: No Physical Aggression / Violence: No Duty to Warn: No Access to Firearms a concern: No  Assessment of progress:  stabilized  Diagnosis:   ICD-10-CM   1. Recurrent major depression resistant to treatment (HCC) (r/o MDD+Dysthymia)  F33.9     2. Mixed obsessional thoughts and acts  F42.2     3. Social anxiety disorder  F40.10     4. Insomnia due to mental condition  F51.05     5. Parenting stress  Z63.8     6. Caregiver stress  Z63.6     7. Relationship problem with family members  Z63.8     8. 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 Casey Diaz, encourage in treatment, caution re efforts to represent him directly to others without his clear consent.  OK to confront/intervene, use recommendations how to organize and frame for best  results.  May commit depending on criteria.  Continue being willing to decline services or interactions where needed and require his effort (or honesty, acknowledgment) first.  Also emphasize rewarding or praising positive efforts to be responsible, agreeable.  Re. mother,  try best to keep level and educate her where needed.  OK to set limits for both on what she will/won't do, prioritize boundary work as declaring her own willingness/unwillingness depending, and then being as good as her own word.  Re. Casey Diaz, prioritize asking  over assuming/resenting and use opportunities that present themselves to culture a warmer, less guarded relationship..  With all, try to obtain agreement to have a difficult conversation before going into one, and try not to frontload extra requests, but pursue one assertiveness issue at a time. Family assistance, risk of perceived codependency -- Self-affirm that wanting to help is not dysfunctional, all calls are judgment calls, and where money is concerned, of course confer with Casey Diaz about policy, which may very well include a no questions asked budget.  Option Al-Anon for support and boundary help.  Endorse connecting Casey Diaz to further help, either through [facility] or [facility], and recruiting family members into necessary confrontation.   Relationship with Casey Diaz -- Open to join tx, support separate marriage counseling.  Consider addressing Casey Diaz's overinterpretations of choosing FOO over FOI/him and perceived negativity toward Casey Diaz.  Consider further willingness to challenge sexual habits on grounds of feeling left out and/or objectified, and ask for Casey Diaz to work out sexual expectations together rather than simply accede to his perceived demands and in the process help mislead him.  Overall, take care to approach one issue at a time with him, too.  General recommendation to relabel times when she has intrusive thoughts of better off without and speak more frankly about the issue or emotional strain at hand instead of what drastic solution comes to mind. Parenting -- Continue appropriate efforts to shape Casey Diaz's socializing and responsibility to clean up after himself, e.g., after you ___, then you can ___.  Re. perceived loss of close relationship, self-affirm that she always wanted to create a buddy but any adolescent boy still distances and may go through a sullen, isolative period.  Other options for therapy for him.  Endorse summer camp as planned, see tips for communicating and working with resistance. Anxious  and depressive thinking -- Generally, look for thought patterns of shaming self irrationally, and of collecting troubles repetitively (agonizing) to the point of desperation, and back off.  Both are distress-making, entrench depression, and interfere with interpersonal effectiveness.  Similarly watch out for cynical conclusion-jumping and risk of ginning up resentment, hopelessness, and eventual death wishes.  Re resentments, recognize they will only get in the way of getting listened to by others.  Use the device of naming worry/catastrophizing/cynical thoughts as an internal voice, Jerilee, and table intrusive thoughts rather than trying to bargain with them or get others to take over. Intrusive thoughts and checking compulsions -- Practice, ad lib but actively, pressing on without checking corners and spaces for imaginary abused children or roadsides for imagined hit and run victims.  Practice trust and move on, let the uncertain feeling be evidence she is rewiring her nervous system rather than the problem to be avoided.  As needed, self-remind that OCD took these shapes because of her own hx as a victim, and perhaps one single indiscretion when she was a tortured teenager, not any pattern or propensity to abusing others.  Remember, her OCD picks up whatever  she feels is most important and uses it to create false guilt; the challenge is to weather it, not treat it like it's true. Self-care -- Continue efforts to engage exercise, part-time work, and supportive relationship outside the home.  Continue to grow in reasonably representing her physical limitations and needs without self-shaming.  Address insomnia with timely yellowing of the light and/or orange lenses. Medication -- Endorse continued Spravato  treatment for resistant depression and overlearned obsessions and compulsions, subject to psychiatric judgment Other recommendations/advice -- As may be noted above.  Continue to utilize previously learned  skills ad lib. Medication compliance -- Maintain medication as prescribed and work faithfully with relevant prescriber(s) if any changes are desired or seem indicated. Crisis service -- Aware of call list and work-in appts.  Call the clinic on-call service, 988/hotline, 911, or present to [facility] or ER if any life-threatening psychiatric crisis. Followup -- Return for time as already scheduled.  Next scheduled visit with me 06/26/2024.  Next scheduled in this office 06/26/2024.  Lamar Kendall, PhD Casey Kendall, PhD LP Clinical Psychologist, [facility] [address] [place], [place] [address] [phone]

## 2024-06-05 ENCOUNTER — Other Ambulatory Visit (HOSPITAL_BASED_OUTPATIENT_CLINIC_OR_DEPARTMENT_OTHER): Payer: Self-pay

## 2024-06-19 ENCOUNTER — Ambulatory Visit: Admitting: Psychiatry

## 2024-06-26 ENCOUNTER — Telehealth: Payer: Self-pay

## 2024-06-26 ENCOUNTER — Other Ambulatory Visit: Payer: Self-pay | Admitting: Psychiatry

## 2024-06-26 ENCOUNTER — Ambulatory Visit (INDEPENDENT_AMBULATORY_CARE_PROVIDER_SITE_OTHER): Admitting: Psychiatry

## 2024-06-26 DIAGNOSIS — F339 Major depressive disorder, recurrent, unspecified: Secondary | ICD-10-CM | POA: Diagnosis not present

## 2024-06-26 DIAGNOSIS — F4010 Social phobia, unspecified: Secondary | ICD-10-CM

## 2024-06-26 DIAGNOSIS — Z638 Other specified problems related to primary support group: Secondary | ICD-10-CM | POA: Diagnosis not present

## 2024-06-26 DIAGNOSIS — F422 Mixed obsessional thoughts and acts: Secondary | ICD-10-CM

## 2024-06-26 NOTE — Telephone Encounter (Signed)
 Pt called back and scheduled an appointment. She mentioned that she was here in June and that she spoke with Lolita but she wasn't seen for anything. She said they kind of just spoke for a few minutes and the pt left because she noticed Lolita was busy. She doesn't know why that appt was cancelled. I told her it might be because nothing happened at that time so it wouldn't be considered an office visit. I told her I would follow up with Lolita on Monday to see if she remembers anything about that day.

## 2024-06-26 NOTE — Progress Notes (Signed)
 Psychotherapy Progress Note [facility] [name], PhD LP  Patient ID: Casey Diaz)    MRN: [id number] Therapy format: Individual psychotherapy Date: 06/26/2024      Start: 11:18a     Stop: 12:05p     Time Spent: 47 min Location: In-person   Session narrative (presenting needs, interim history, self-report of stressors and symptoms, applications of prior therapy, status changes, and interventions made in session) Reports somewhat better time with worry and intrusive thoughts, is trying actively to pause worrying, give thoughts less air time.  Affirmed and encouraged, as this strengthens her response to intrusive thoughts.  Re [name], he is still managing multiple stresses from work and relationship and parenting.  Hopeful of some refreshment going to [place] on work, at early [place] time.  Envious, and apprehensive about another solo parenting episode.  Discussed resources, support, and reinforced assertive mindset with the responsibilities she foresees, readiness to tone down worry about getting overwhelmed.  Threw [name] a family birthday party last week.  Dismayed that he got overstimulated and withdrew to the bedroom for a bit.  On review, it probably was self-regulating in autistic spectrum, not a failure.  Trying to get him to try out D&D club at his school, worrying that he'll cancel it.  Discussed how strong or urgent her expectations are, able to accept that he may or may not build on ideas she brings him, but if she gets him to be curious and try it out, that is victory over his resistance, actually.  Concerns for 2 friends, one of whom is not returning messages, one CP, one bipolar.  Tempted to feel shunned, has let off leaving messages herself, doesn't want to feel like she's pestering.  The bipolar one has attempted to lean on [name] sometimes for too much as well.  Explored reasons to spot a friend struggling enough to fall off, and to not take  it personally.  Weighed the value of asking after her again against the distress of risking another silence, and charged her with the responsibility to assume the better if she is not willing to chance asking.  Therapeutic modalities: Cognitive Behavioral Therapy, Solution-Oriented/Positive Psychology, and Assertiveness/Communication  Mental Status/Observations:  Appearance:   Casual     Behavior:  Appropriate  Motor:  Normal exc CP gait  Speech/Language:   Clear and Coherent  Affect:  Appropriate  Mood:  dysthymic  Thought process:  normal  Thought content:    Rumination and less  Sensory/Perceptual disturbances:    WNL  Orientation:  Fully oriented  Attention:  Good    Concentration:  Fair  Memory:  WNL  Insight:    Variable  Judgment:   Good  Impulse Control:  Good   Risk Assessment: Danger to Self: No Self-injurious Behavior: No Danger to Others: No Physical Aggression / Violence: No Duty to Warn: No Access to Firearms a concern: No  Assessment of progress:  progressing  Diagnosis:   ICD-10-CM   1. Recurrent major depression resistant to treatment (HCC) (r/o MDD+Dysthymia)  F33.9     2. Mixed obsessional thoughts and acts  F42.2     3. Social anxiety disorder  F40.10     4. Parenting stress  Z63.8      Plan:  Family of origin concerns -- Re. [name], encourage in treatment, caution re efforts to represent him directly to others without his clear consent.  OK to confront/intervene, use recommendations how to organize and frame for best  results.  May commit depending on criteria.  Continue being willing to decline services or interactions where needed and require his effort (or honesty, acknowledgment) first.  Also emphasize rewarding or praising positive efforts to be responsible, agreeable.  Re. mother,  try best to keep level and educate her where needed.  OK to set limits for both on what she will/won't do, prioritize boundary work as declaring her own  willingness/unwillingness depending, and then being as good as her own word.  Re. Ed, prioritize asking over assuming/resenting and use opportunities that present themselves to culture a warmer, less guarded relationship..  With all, try to obtain agreement to have a difficult conversation before going into one, and try not to frontload extra requests, but pursue one assertiveness issue at a time. Family assistance, risk of perceived codependency -- Self-affirm that wanting to help is not dysfunctional, all calls are judgment calls, and where money is concerned, of course confer with H about policy, which may very well include a no questions asked budget.  Option Al-Anon for support and boundary help.  Endorse connecting [place] to further help, either through [facility] or [facility], and recruiting family members into necessary confrontation.   Relationship with H -- Open to join tx, support separate marriage counseling.  Consider addressing H's overinterpretations of choosing FOO over FOI/him and perceived negativity toward [place].  Consider further willingness to challenge sexual habits on grounds of feeling left out and/or objectified, and ask for H to work out sexual expectations together rather than simply accede to his perceived demands and in the process help mislead him.  Overall, take care to approach one issue at a time with him, too.  General recommendation to relabel times when she has intrusive thoughts of better off without and speak more frankly about the issue or emotional strain at hand instead of what drastic solution comes to mind. Parenting -- Continue appropriate efforts to shape [name]'s socializing and responsibility to clean up after himself, e.g., after you ___, then you can ___.  Re. perceived loss of close relationship, self-affirm that she always wanted to create a buddy but any adolescent boy still distances and may go through a sullen, isolative period.  Other options for therapy for  him.  Endorse summer camp as planned, see tips for communicating and working with resistance. Anxious and depressive thinking -- Generally, look for thought patterns of shaming self irrationally, and of collecting troubles repetitively (agonizing) to the point of desperation, and back off.  Both are distress-making, entrench depression, and interfere with interpersonal effectiveness.  Similarly watch out for cynical conclusion-jumping and risk of ginning up resentment, hopelessness, and eventual death wishes.  Re resentments, recognize they will only get in the way of getting listened to by others.  Use the device of naming worry/catastrophizing/cynical thoughts as an internal voice, Jerilee, and table intrusive thoughts rather than trying to bargain with them or get others to take over. Intrusive thoughts and checking compulsions -- Practice, ad lib but actively, pressing on without checking corners and spaces for imaginary abused children or roadsides for imagined hit and run victims.  Practice trust and move on, let the uncertain feeling be evidence she is rewiring her nervous system rather than the problem to be avoided.  As needed, self-remind that OCD took these shapes because of her own hx as a victim, and perhaps one single indiscretion when she was a tortured teenager, not any pattern or propensity to abusing others.  Remember, her OCD picks up whatever  she feels is most important and uses it to create false guilt; the challenge is to weather it, not treat it like it's true. Self-care -- Continue efforts to engage exercise, part-time work, and supportive relationship outside the home.  Continue to grow in reasonably representing her physical limitations and needs without self-shaming.  Address insomnia with timely yellowing of the light and/or orange lenses. Medication -- Endorse continued Spravato  treatment for resistant depression and overlearned obsessions and compulsions, subject to psychiatric  judgment Other recommendations/advice -- As may be noted above.  Continue to utilize previously learned skills ad lib. Medication compliance -- Maintain medication as prescribed and work faithfully with relevant prescriber(s) if any changes are desired or seem indicated. Crisis service -- Aware of call list and work-in appts.  Call the clinic on-call service, 988/hotline, 911, or present to [facility] or ER if any life-threatening psychiatric crisis. Followup -- Return for time as already scheduled.  Next scheduled visit with me 07/08/2024.  Next scheduled in this office 07/08/2024.  Lamar Kendall, PhD [name], PhD LP Clinical Psychologist, [facility] [address] [place], [place] [address] [phone]

## 2024-06-27 ENCOUNTER — Other Ambulatory Visit: Payer: Self-pay | Admitting: Psychiatry

## 2024-06-27 DIAGNOSIS — F339 Major depressive disorder, recurrent, unspecified: Secondary | ICD-10-CM

## 2024-06-27 DIAGNOSIS — F422 Mixed obsessional thoughts and acts: Secondary | ICD-10-CM

## 2024-06-30 ENCOUNTER — Other Ambulatory Visit: Payer: Self-pay | Admitting: Psychiatry

## 2024-07-01 ENCOUNTER — Ambulatory Visit: Admitting: Psychiatry

## 2024-07-03 ENCOUNTER — Encounter: Payer: Self-pay | Admitting: Physician Assistant

## 2024-07-08 ENCOUNTER — Ambulatory Visit (INDEPENDENT_AMBULATORY_CARE_PROVIDER_SITE_OTHER): Admitting: Psychiatry

## 2024-07-08 DIAGNOSIS — F422 Mixed obsessional thoughts and acts: Secondary | ICD-10-CM | POA: Diagnosis not present

## 2024-07-08 DIAGNOSIS — F4010 Social phobia, unspecified: Secondary | ICD-10-CM | POA: Diagnosis not present

## 2024-07-08 DIAGNOSIS — F339 Major depressive disorder, recurrent, unspecified: Secondary | ICD-10-CM | POA: Diagnosis not present

## 2024-07-08 DIAGNOSIS — G809 Cerebral palsy, unspecified: Secondary | ICD-10-CM

## 2024-07-08 DIAGNOSIS — Z638 Other specified problems related to primary support group: Secondary | ICD-10-CM
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
## 2024-07-15 ENCOUNTER — Ambulatory Visit: Admitting: Psychiatry

## 2024-07-22 ENCOUNTER — Ambulatory Visit

## 2024-07-22 NOTE — Progress Notes (Signed)
 Patient ws measured for AFO today we are ordering Walk on anterior AFO brace [phone] as patient already has the posterior version  She like the style of last AFO patient received was  02/2022 will need to call [employer] and see how often patient can receive / also see if shara is required   [id number] / Retail $1375.00

## 2024-07-24 ENCOUNTER — Telehealth: Payer: Self-pay

## 2024-07-24 ENCOUNTER — Ambulatory Visit: Admitting: Psychiatry

## 2024-07-24 DIAGNOSIS — G809 Cerebral palsy, unspecified: Secondary | ICD-10-CM | POA: Diagnosis not present

## 2024-07-24 DIAGNOSIS — F4010 Social phobia, unspecified: Secondary | ICD-10-CM | POA: Diagnosis not present

## 2024-07-24 DIAGNOSIS — Z636 Dependent relative needing care at home: Secondary | ICD-10-CM

## 2024-07-24 DIAGNOSIS — Z638 Other specified problems related to primary support group: Secondary | ICD-10-CM

## 2024-07-24 DIAGNOSIS — F339 Major depressive disorder, recurrent, unspecified: Secondary | ICD-10-CM | POA: Diagnosis not present

## 2024-07-24 NOTE — Telephone Encounter (Signed)
 AFO covered every 2 years no auth req  No auth req ref #[id number] Coverage Ref #[id number]
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
## 2024-07-27 ENCOUNTER — Other Ambulatory Visit: Payer: Self-pay | Admitting: Psychiatry

## 2024-07-27 DIAGNOSIS — F422 Mixed obsessional thoughts and acts: Secondary | ICD-10-CM

## 2024-08-06 ENCOUNTER — Telehealth: Payer: Self-pay | Admitting: Psychiatry

## 2024-08-06 ENCOUNTER — Other Ambulatory Visit: Payer: Self-pay

## 2024-08-06 DIAGNOSIS — F5105 Insomnia due to other mental disorder: Secondary | ICD-10-CM

## 2024-08-06 MED ORDER — TEMAZEPAM 30 MG PO CAPS: ORAL_CAPSULE | ORAL | 0 refills | Status: DC

## 2024-08-06 NOTE — Telephone Encounter (Signed)
 Pended

## 2024-08-06 NOTE — Telephone Encounter (Signed)
 Pt called requesting Rx Temazepam  30 mg. [facility]  [address] 10/[address]. Only 2 left did not want to wait for apt.

## 2024-08-08 ENCOUNTER — Encounter: Payer: Self-pay | Admitting: Psychiatry

## 2024-08-08 ENCOUNTER — Ambulatory Visit (INDEPENDENT_AMBULATORY_CARE_PROVIDER_SITE_OTHER): Admitting: Psychiatry

## 2024-08-08 DIAGNOSIS — F422 Mixed obsessional thoughts and acts: Secondary | ICD-10-CM | POA: Diagnosis not present

## 2024-08-08 DIAGNOSIS — F339 Major depressive disorder, recurrent, unspecified: Secondary | ICD-10-CM

## 2024-08-08 DIAGNOSIS — G43009 Migraine without aura, not intractable, without status migrainosus: Secondary | ICD-10-CM

## 2024-08-08 DIAGNOSIS — F4010 Social phobia, unspecified: Secondary | ICD-10-CM

## 2024-08-08 DIAGNOSIS — F5105 Insomnia due to other mental disorder: Secondary | ICD-10-CM

## 2024-08-08 NOTE — Progress Notes (Signed)
 ADIN LARICCIA [id number] 03/12/1966 58 y.o.    Subjective:   Patient ID:  Casey Diaz is a 58 y.o. (DOB Feb 23, 1966) female.  Chief Complaint:  Chief Complaint  Patient presents with  . Follow-up  . Depression  . Anxiety  . Sleeping Problem     HPI Arriyah E Wagster presents to the office today for follow-up of OCD and severe anxiety.     December 2019 visit the following was noted: No meds were changed. Lives in [name] and back for followup.  Sx are about the same.  Has to take meds with different sizes. Pt reports that mood is Anxious and Depressed and describes anxiety as Severe. Anxiety symptoms include: Excessive Worry, Obsessive Compulsive Symptoms:   Checking,,. Pt reports has interrupted sleep and nocturia. Pt reports that appetite is good. Pt reports that energy is no change and down slightly. Concentration is down slightly. Suicidal thoughts:  denied by patient. Loves the environment of [name] but misses some things there.  She's not able to work there.  H works there and likes it.  Struggled with not working, feels isolated and not up to task of meeting people.  Does attend a church and met a friend who's been helpful.  Leaving for [name] on 10/16/18.   04/09/2020 appointment the following is noted:  Staying another year in [name] bc Covid and other things. Last few months a lot of crying spells.  Is in menopause. Wonders about med changes though is nervous about it.  Crying spells associated with depressing thoughts more than stress or OCD.   Covid really hard on everyone and couldn't see family for 18 mos.  Family still very dysfunctional. No close friends in part due to OCD and depression. Son high Autism spectrum with ADHD and anxiety and she's with him all the time. Greater health problems with CP so more pains.   05/15/20 appt with the following noted: Started Estroven for menopause and helps some. Still depressed.  Chronically. In [place]  for 2 more weeks then to  [name] for another year. A lot of stressors lately triggering more checking and anxiety.   OCD is her CC now and seems.  Got worse DT stress.   Stressed with Asberger's son and her health.  H works a lot.  Her FOO still stress. Plan: Trintellix 10 mg 1 tablet in the morning with food and reduce fluvoxamine  to 5 tablets nightly for 1 week  then reduce it to 4 tablets nightly.   07/02/20 appt with the following noted: Decided not to get Trintellix bc difficulty getting it. It is available.  There.  Wants to start it now.   Both depression and OCD are severe.  Not suicidal in intent or plan. Did not take samples with her to [name] but will be back in December. covid is worse there and travel is difficult.  Wants to reduce Wellbutrin  DT dry mouth. Plan: She's afraid to reduce Luvox  at this time DT fear of worsening OCD.  But will consider. Trintellix 10 mg 1 tablet in the morning with food and reduce fluvoxamine  to 5 tablets nightly for 1 week  then reduce it to 4 tablets nightly. Also reduce Wellbutrin  XL to 300 mg daily.    9-13 2022 appointment with the following noted: [place].  Broke arm a month ago and surgery.  It's all been rough adjustment.   B has cancer on his face and M fell taking him to the doctor.  Misses the water and weather of [name].   Cry a lot more since menopause. Still depression and anxiety and OCD.  Asks about ketamine. On Wellbutrin  300, Luvox  300.  No Trintellix. Added Ativan  2 mg AM and HS and it helps.  More likely to get upset at night. Plan: Increase Luvox  back to 400 mg daily.  She thinks she's worse on less. Continue Wellbutrin  XL to 300 mg daily. Plan to start Spravato  for TRD asap   09/27/2021 appointment with the following noted:  She has started Spravato  today at 54 mg intranasally.  She tolerated it well without unusual nausea or vomiting headache or other somatic symptoms.  She did have the expected dissociation which gradually  resolved over the course of the 2-hour period of observation.  She was a little concerned about her balance given her cerebral palsy but has not noted unusual or unexpected problems.  She is motivated to can continue Spravato  in hopes of reducing her depressive symptoms. 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  She also has treatment resistant OCD which is partially managed with medications but is still quite disabling.  She is tolerating the medications well.  She is sleeping adequately.  Her appetite is adequate.  She is not having suicidal thoughts.  She continues to wish for a better treatment for OCD that would give her some relief.  09/30/2021 appointment with the following noted: She received her first dose of Spravato  84 mg intranasally today.  She tolerated it well without unusual nausea, vomiting, or other somatic symptoms.  Dissociation as expected did occur and gradually resolved over the 2-hour period of observation.  She did have a mild headache today with the treatment and received ibuprofen 600 mg at her request.  We will follow this to see if it is a pattern Patient is still depressed.  She said she was late with her medicine today and today was a particularly depressing day.  However she notes that the Spravato  has lifted her mood considerably even today.  She is hopeful that it will continue to be helpful.  No suicidal thoughts.  She has ongoing chronic anxiety and OCD at baseline.  10/04/21 appt noted: Patient received Spravato  84 mg for the second time today.  She tolerated it well without any unusual headache, nausea or vomiting or other somatic symptoms.  Dissociation did occur and she gradually [place] resolution over the 2-hour period of observation. She did not have any unusual problems after she left the office last Spravato  administration.  She did not have any specific problems with balance or walking.  She is at increased risk of that  difficulty because of cerebral palsy.  So far she has not noticed much mood effect from the medication beyond the first day of receiving it.  However she would like to continue Spravato  in hopes of getting the antidepressant effect that is desired. Stress dealing with mother's behavior at party pt hosted.  Guilt over it.  10/07/2021 appointment noted: Patient received Spravato  84 mg for the second time today.  She tolerated it well without any unusual headache, nausea or vomiting or other somatic symptoms.  Dissociation did occur and she gradually [place] resolution over the 2-hour period of observation. She still is not sure about the antidepressant effect of Spravato .  Events over the holidays and demands, make it difficult to assess.  She still notes that the OCD tends to worsen the depression and vice versa.  She tolerates the  Spravato  well and wants to continue the trial.  10/15/2021 appointment with the following noted: Patient received Spravato  84 mg for the second time today.  She tolerated it well without any unusual headache, nausea or vomiting or other somatic symptoms.  Dissociation did occur and she gradually [place] resolution over the 2-hour period of observation. Patient says it was somewhat difficult to evaluate the effect of the Spravato .  It was scheduled to be twice weekly for 4 weeks consecutively but the holidays have interfered with that administration.  She asked what specifically should be she should be looking for in order to assess improvement.  That was discussed.  The OCD is unchanged and the depression so far is not significantly different.  She still tolerates meds.  There have been no recent med changes  10/19/2021 appt noted: Patient received Spravato  84 mg for the second today.  She tolerated it well without any unusual headache, nausea or vomiting or other somatic symptoms.  Dissociation did occur and she gradually saw resolution over the 2-hour period of observation.    10/21/2021 appointment noted: Patient received Spravato  84 mg today.  She tolerated it well without any unusual headache, nausea or vomiting or other somatic symptoms.  Dissociation did occur and she gradually saw resolution over the 2-hour period of observation.  She feels better than last week.  She is not as depressed and down.  She is still dealing with grief around the death of her cousin that was unexpected.  It is still difficult to tell how much the Spravato  was doing but she is hopeful.  Anxiety is still present with the OCD.  She is not having suicidal thoughts.  She is not hopeless.  She wants to continue treatment.  10/25/2021 appointment with the following noted: Patient received Spravato  84 mg today.  She tolerated it well without any unusual headache, nausea or vomiting or other somatic symptoms.  Dissociation did occur and she gradually saw resolution over the 2-hour period of observation.  She does not typically find the dissociation very strong. She is beginning to think the Spravato  is helping somewhat with the depression.  It has been difficult to tell with the holidays intervening as well as the death of her cousin.  She has not been able to get Spravato  twice weekly for 4 weeks straight as typically planned.  However she is hopeful.  The OCD remains significant.  She still has a tendency to think very negatively.  She is not suicidal.  10/28/2021 appointment with the following noted: Patient received Spravato  84 mg today.  She tolerated it well without any unusual headache, nausea or vomiting or other somatic symptoms.  Dissociation did occur and she gradually saw resolution over the 2-hour period of observation.  She does not typically find the dissociation very strong. She is feeling more hopeful about the administration of Spravato .  She is having less depression she believes.  Still not dramatically different.  She still has a tendency to have a lot of anxiety and rumination and  OCD.  She is not suicidal.  She is eager to continue the Spravato .  11/01/2021 appointment with the following noted: Patient received Spravato  84 mg today.  She tolerated it well without any unusual headache, nausea or vomiting or other somatic symptoms.  Dissociation did occur and she gradually saw resolution over the 2-hour period of observation.  She does not typically find the dissociation very strong. She is continuing to see a little bit of improvement in depression  with Spravato .  The anxiety remains but may be not as severe.  The OCD remains markedly severe chronically.  She is not suicidal.  She is encouraged by the degree of improvement with Spravato  and inability to enjoy things more and not be quite as ruminative.  11/04/2021 appt noted: Patient received Spravato  84 mg today.  She tolerated it well without any unusual headache, nausea or vomiting or other somatic symptoms.  Dissociation did occur and she gradually saw resolution over the 2-hour period of observation.  She does not typically find the dissociation very strong. No SE complaints with meds. She continues to feel hopeful about the Spravato .  She has less depression.  Because of a number of factors she is uncertain of the full benefit but thinks she is somewhat less depressed.  Her anxiety and OCD remain significant but a little better.  She is tolerating the medications and does not desire medicine change.  She is not currently complaining of insomnia.   11/08/2021 appointment the following noted: Patient received Spravato  84 mg today.  She tolerated it well without any unusual headache, nausea or vomiting or other somatic symptoms.  Dissociation did occur and she gradually saw resolution over the 2-hour period of observation.  She does not typically find the dissociation very strong. No SE complaints with meds. She feels the Spravato  is helping somewhat.  She would like to see a greater effect.  However she is able to enjoy things.   She is productive at home.  She would like to see a lifting of a degree of sadness that remains.  The anxiety and OCD remained largely unchanged.  She wondered about the dosing of Wellbutrin  300 mg a day and Luvox  300 mg a day and possible increases.  She has been at higher doses in the past.  She plans to start water therapy for her weakness and for her shoulder.  11/11/2021 appointment with the following noted: Patient received Spravato  84 mg today.  She tolerated it well without any unusual headache, nausea or vomiting or other somatic symptoms.  Dissociation did occur and she gradually saw resolution over the 2-hour period of observation.  She does not typically find the dissociation very strong. No SE complaints with meds. She feels the Spravato  is clearly helping the depression.  She would like to see a more significant effect.  She is still having trouble thinking positive. Her energy is fair.  Concentration is good except for the problem with chronic obsessions. She has been taking Wellbutrin  300 mg in Luvox  300 mg for quite some time but has taken higher doses in the past.  We discussed that.  She would like to try higher doses in order to get a better effect if possible. We just increased the doses a couple of days ago.  No effect yet.  11/15/2021 appointment with the following noted: Patient received Spravato  84 mg today.  She tolerated it well without any unusual headache, nausea or vomiting or other somatic symptoms.  Dissociation did occur and she gradually saw resolution over the 2-hour period of observation.  She does not typically find the dissociation very strong. No SE complaints with meds. The patient is now convinced that the Spravato  is helping the depression.  She would like to continue twice weekly Spravato  this week if possible.  She has tolerated the increase in Wellbutrin  to 450 mg daily and the increase and fluvoxamine  to 400 mg daily without complications thus far.  The OCD  and anxiety  feed the depression to some extent. She spends approximately 2 hours daily with checking compulsions due to obsessions about causing harm to others.  For example fearing that when she has hit a pot hole that she may have hit a person and going back to check.  Checking corners and rooms out of fear that she may have harmed someone.  Other various checking compulsions.  She is hoping the increase in fluvoxamine  to 400 mg will reduce that over the weeks to come.  She is not seeing a significant difference with the addition of the Spravato  though she understands that was not expected.  She is more productive at home and more motivated and able to enjoy things more fully as a result of the Spravato  treatment.  She is tolerating the medication  11/18/2021 appointment with the following noted: Patient received Spravato  84 mg today.  She tolerated it well without any unusual headache, nausea or vomiting or other somatic symptoms.  Dissociation did occur and she gradually saw resolution over the 2-hour period of observation.  She does not typically find the dissociation very strong. No SE complaints with meds. She clearly believes the Spravato  has been helpful for the depression.  She wonders whether to continue to treatments weekly or to cut back to 1 weekly.  She would like to continue twice weekly in hopes of getting additional improvement in the depression because it is not resolved but it is difficult to get here twice a week in terms of arranging rides. She is recently increased Wellbutrin  XL to 450 mg daily and fluvoxamine  to 400 mg daily but they have not had time to have an official effect.  She is tolerating that well.  She is tolerating meds overwork overall well. The OCD remains the same as noted on 11/15/2021  11/25/21 appt noted: Patient received Spravato  84 mg today.  She tolerated it well without any unusual headache, nausea or vomiting or other somatic symptoms.  Dissociation did occur and  she gradually saw resolution over the 2-hour period of observation.  She does not typically find the dissociation very strong. No SE complaints with meds. She thinks the increase in Wellbutrin  and Luvox  have been potentially helpful for depression and OCD respectively.  It has been too early to see the full effect.  She is sleeping and eating well.  She is functioning at home.  She still spends a lot of time that is about 2 hours a day dealing with compulsive behaviors.  12/02/21 appt noted: Patient received Spravato  84 mg today.  She tolerated it well without any unusual headache, nausea or vomiting or other somatic symptoms.  Dissociation did occur and she gradually saw resolution over the 2-hour period of observation.  She does not typically find the dissociation very strong. No SE complaints with meds. Several losses and stressors recently that affect her sense of mood. However still sees significant benefit from the Spravato  for her depression.  Wants to continue it. Suspect early  some benefit from the increased Wellbutrin  for depression and Luvox  for OCD. Tolerating meds. No complaints about the meds. Sleeping and eating well.  No new health concerns.  12/09/21 appt noted: Patient received Spravato  84 mg today.  She tolerated it well without any unusual headache, nausea or vomiting or other somatic symptoms.  Dissociation did occur and she gradually saw resolution over the 2-hour period of observation.  She does not typically find the dissociation very strong. No SE complaints with meds. Seeing noticeable improvement from  increase fluvoxamine  to 400 mg daily.  Tolerating meds without concerns over them. Depression is stable with residual sx of easy guilt and easily stressed.  OCD contributes to depression but depression is not severe with less crying spells.  Productive at home with chores.  Enjoyed recent birthday.  Sleeping good. No new concerns.  12/23/2021 appointment noted: Patient  received Spravato  84 mg today.  She tolerated it well without any unusual headache, nausea or vomiting or other somatic symptoms.  Dissociation did occur and she gradually saw resolution over the 2-hour period of observation.  She does not typically find the dissociation very strong. No SE complaints with meds. Seeing noticeable improvement from increase fluvoxamine  to 400 mg daily.  Tolerating meds without concerns over them. Her depression is somewhat improved with the Spravato .  She also feels generally a little lighter.  She is more motivated.  She is less overwhelmed by guilt.  The OCD is gradually improving but is still quite time-consuming as noted before.  She is sleeping well.  No side effects  12/30/2021 appointment with the following noted: Patient received Spravato  84 mg today.  She tolerated it well without any unusual headache, nausea or vomiting or other somatic symptoms.  Dissociation did occur and she gradually saw resolution over the 2-hour period of observation.  She does not typically find the dissociation very strong. No SE complaints with meds. Seeing noticeable improvement from increase fluvoxamine  to 400 mg daily.  Tolerating meds without concerns over them. She is confident of her the improvement seen with Spravato .  She is less hopeless.  Guilt is marked remarkably improved.  She is not having any thoughts of death or dying.  She is more motivated for activities such as exercise which she is recently started.  She is sleeping well. The OCD remains severe but it is improving somewhat with the increase in fluvoxamine .  It is still consuming a couple hours per day.  01/10/22 apravato 84 admin  01/24/22 appt noted: Patient received Spravato  84 mg today.  She tolerated it well without any unusual headache, nausea or vomiting or other somatic symptoms.  Dissociation did occur and she gradually saw resolution over the 2-hour period of observation.  She does not typically find the  dissociation very strong. No SE complaints with meds. Very tearful today.  Feels like she has been suppressing emotion in the Spravato  caused it to be released.  Discussed some stressors.  Overall still feels the medicine is helpful.  She has missed some of the scheduled Spravato  treatments that were intended to be weekly due to circumstances beyond her control.  She is still struggling with OCD as previously noted but does believe the medications are helpful. Plan no med changes  01/31/2022 received Spravato  84 mg today  02/09/2022 appointment with the following noted: Patient received Spravato  84 mg today.  She tolerated it well without any unusual headache, nausea or vomiting or other somatic symptoms.  Dissociation did occur and she gradually saw resolution over the 2-hour period of observation.  She does not typically find the dissociation very strong. No SE complaints with meds. Spravato  clearly helps depression and OCD but easily gets overwhelmed and tearful with fairly routine stressors.  Tolerating meds. Sleep and appetite is OK Asks to increase lorazepam  to 2 mg AM and HS and 1mg  afternoon  02/16/22 appt noted: Patient received Spravato  84 mg today.  She tolerated it well without any unusual headache, nausea or vomiting or other somatic symptoms.  Dissociation did  occur and she gradually saw resolution over the 2-hour period of observation.  She does not typically find the dissociation very strong. No SE complaints with meds. She has chronic depesssion and OCD but is improved with Spravato , both dx versus before.  She has continued Luvox  400 mg and Wellbutrin  450 mg and is tolerating it.  Chronically easily stressed.  Tolerating all meds.  Doesn't like taking more meds.  Spending a couple hours daily with OCD.  No SI No med changes.  02/21/22 appt noted:   Doesn't like taking more meds.  Spending a couple hours daily with OCD.  No SI No med changes.  02/21/22 appt noted: Patient received  Spravato  84 mg today.  She tolerated it well without any unusual headache, nausea or vomiting or other somatic symptoms.  Dissociation did occur and she gradually saw resolution over the 2-hour period of observation.  She does not typically find the dissociation very strong. No SE complaints with meds. She has chronic depesssion and OCD but is improved with Spravato , both dx versus before.  She has continued Luvox  400 mg and Wellbutrin  450 mg and is tolerating it.  Chronically easily overwhelmed and doesn't know why.  Tolerating all meds. Wants to continue meds.  03/16/22 appt noted: Patient received Spravato  84 mg today.  She tolerated it well without any unusual headache, nausea or vomiting or other somatic symptoms.  Dissociation did occur and she gradually saw resolution over the 2-hour period of observation.  She does not typically find the dissociation very strong. No SE complaints with meds. Overall she still feels the Spravato  has been helpful not only for her depression but also for her OCD which was somewhat unexpected.  OCD is still significant but it is less severe than prior to starting Spravato .  She is tolerating Luvox  400 mg and Wellbutrin  450 mg.  We discussed possible med adjustments.  03/23/22 appt noted: Patient received Spravato  84 mg today.  She tolerated it well without any unusual headache, nausea or vomiting or other somatic symptoms.  Dissociation did occur and she gradually saw resolution over the 2-hour period of observation.  She does not typically find the dissociation very strong. No SE complaints with meds. She is still depressed and still has OCD of course but is improved with the Spravato .  She is tolerating the medications well.  We had previously discussed the possibility of switching some of the Wellbutrin  to Auvelity  and she is very interested in that in hopes of further improvement in depression and OCD.  She understands that Auvelity  is not used for OCD on the label.   She is tolerating the medications.  She is still easily overwhelmed.  She is sleeping and eating okay.. Plan: Reduce Wellbutrin  XL to 300 mg AM and add Auvelity  1 tablet each AM  03/30/22 appt noted: Patient received Spravato  84 mg today.  She tolerated it well without any unusual headache, nausea or vomiting or other somatic symptoms.  Dissociation did occur and she gradually saw resolution over the 2-hour period of observation.  She does not typically find the dissociation very strong. No SE complaints with meds. She is still depressed and still has OCD of course but is improved with the Spravato .  She is tolerating the medications well.  No difference with Auvelity  1 AM so far and no SE.  Going on vacation on Saturday. 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 Sleep and appetite good. Plan: Increase Auvelity  to 1 twice daily and reduce Wellbutrin   to XL 150 every morning  04/14/2022 appointment with the following noted: Patient received Spravato  84 mg today.  She tolerated it well without any unusual headache, nausea or vomiting or other somatic symptoms.  Dissociation did occur and she gradually saw resolution over the 2-hour period of observation.  She does not typically find the dissociation very strong. No SE complaints with meds. She is still depressed and still has OCD of course but is improved with the Spravato .  She is tolerating the medications well.  Just increased Auvelity  to BID yesterday and reduced Wellbutrin  to 150 AM. No SE so far.  No change in mood or anxiety so far.  Chronic OCD as noted and residual depression and chronic fatigue.  04/21/2022 appointment with the following noted: Patient received Spravato  84 mg today.  She tolerated it well without any unusual headache, nausea or vomiting or other somatic symptoms.  Dissociation did occur and she gradually saw resolution over the 2-hour period of observation.  She does not typically find the dissociation very strong. No  SE complaints with meds. She is still depressed and still has OCD of course but is improved with the Spravato .   She has questions about the dosing of lorazepam . She tends to have negative anxious thoughts at night.  This tends to interfere with her ability to go to sleep.  She is getting about 8 to 9 hours of sleep.  She is tolerating the meds without excessive sedation and does not nap during the day.  05/06/22 appt noted: Patient received Spravato  84 mg today.  She tolerated it well without any unusual headache, nausea or vomiting or other somatic symptoms.  Dissociation did occur and she gradually saw resolution over the 2-hour period of observation.  She does not typically find the dissociation very strong. No SE complaints with meds. She is still depressed and still has OCD of course but is improved with the Spravato .   Had some questions about timing of dosing of fluvoxamine  and Auvelity . OCD is not quite as time consuming.  Sleep and eating are the same.   No SE meds.  05/25/22 appt noted: Patient received Spravato  84 mg today.  She tolerated it well without any unusual headache, nausea or vomiting or other somatic symptoms.  Dissociation did occur and she gradually saw resolution over the 2-hour period of observation.  She does not typically find the dissociation very strong. No SE complaints with meds. She is still depressed and still has OCD of course but is improved with the Spravato .   She has less OCD when away from home and on vacation of note. Plan: Rec gradually reduce HS lorazepam  to 1 mg Hs.  Can continue lorazepam  2 mg AM and 1 mg in afternoon bc of chronic anxiety and it is helpful and tolerated. She can continue temazepam  30 mg nightly.  She tends to have a lot of anxious negative thoughts at night when she is trying to go to bed  06/16/22 appt noted: Patient received Spravato  84 mg today.  She tolerated it well without any unusual headache, nausea or vomiting or other somatic  symptoms.  Dissociation did occur and she gradually saw resolution over the 2-hour period of observation.  She does not typically find the dissociation very strong. No SE complaints with meds. She is still depressed and still has OCD of course but is improved with the Spravato .   She has less OCD when away from home and on vacation of note. She is tolerating  the medications.  She has continued current medications. 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  She still receives benefit from Spravato .  Her depression and OCD are better with the Spravato .  She thinks she is getting additional benefit with the switch from Wellbutrin  to Auvelity .  07/04/2022 appointment noted: Reports she developed a rash on her face from Auvelity  and feels like she is allergic to it.  She stopped it and went back to Wellbutrin  450 mg every morning.  The rash has cleared up.  She did not require any medical attention and did not have shortness of breath. Overall her depression and OCD are about the same as they have been.  She did not notice a substantial difference from the brief treatment with Auvelity  but she understands she did not take a full course.  She is tolerating the current medicines well. Current meds fluvoxamine  400 mg daily, Wellbutrin  XL 450 mg daily, lorazepam  1 to 2 mg in the morning and 1 to 2 mg at night and 1 mg in the afternoon, temazepam  30 mg nightly. She wants to continue the Spravato  because she feels it has been helpful for both her depression and her racing OCD  07/18/22 appt noted: Patient received Spravato  84 mg today.  She tolerated it well without any unusual headache, nausea or vomiting or other somatic  symptoms.  Dissociation did occur and she gradually saw resolution over the 2-hour period of observation.  She does not typically find the dissociation very strong. No SE complaints with meds. She is still depressed and still has OCD of course but is improved with the Spravato .  Rash better off Auvelity  and back on Welllbutrin XL 450 mg AM, fluvoxamine  400 mg daily.  08/15/22 appt noted: Current psych meds: Wellbutrin  XL 450 mg AM, fluvoxamine  100 mg in AM and 300 mg HS, lorazepam  1 mg 1-2 mg in the AM and HS and 1 tablet prn midday for anxiety, temazepam  30 mg HS Patient received Spravato  84 mg today.  She tolerated it well without any unusual headache, nausea or vomiting or other somatic symptoms.  Dissociation did occur and she gradually saw resolution over the 2-hour period of observation.  She does not typically find the dissociation very strong. No SE complaints with meds. She has a great deal of stress dealing with her family.  Disc brother's ongoing mania and difficulty getting him help and the stress he causes for the family. She wants to continue Spravato  through this very stressful holdicay season and reevaluate the frequency after the New Year.  09/12/22 appt noted: Current psych meds: Wellbutrin  XL 450 mg AM, fluvoxamine  100 mg in AM and 300 mg HS, lorazepam  1 mg 1-2 mg in the AM and HS and 1 tablet prn midday for anxiety, temazepam  30 mg HS Patient received Spravato  84 mg today.  She tolerated it well without any unusual headache, nausea or vomiting or other somatic symptoms.  Dissociation did occur and she gradually saw resolution over the 2-hour period of observation.  She does not typically find the dissociation very strong. No SE complaints with meds. She has a great deal of stress dealing with her family.  Disc brother's  ongoing mania and difficulty getting him help and the stress he causes for the family. She wants to continue Spravato  through this very stressful holdicay season  and reevaluate the frequency after the New Year.  Chronically easily overwhelmed with family. Complaining of HA and history migraine.  Asks for increase imitrex  and disc preventatives like propranolol  ER  09/26/22 appt noted: Current psych meds: Wellbutrin  XL 450 mg AM, fluvoxamine  100 mg in AM and 300 mg HS, lorazepam  1 mg 1-2 mg in the AM and HS and 1 tablet prn midday for anxiety, temazepam  30 mg HS Patient received Spravato  84 mg today.  She tolerated it well without any unusual headache, nausea or vomiting or other somatic symptoms.  Dissociation did occur and she gradually saw resolution over the 2-hour period of observation.  She does not typically find the dissociation very strong. No SE complaints with meds. She has a great deal of stress dealing with her family.  This is ongoing The holidays are much more stressful DT family problems.  She is noting OCD is much worse over the last couple of week.  Depression is better with Spravato . Needed higher dose meds for migraine.   10/03/22 appt noted: Current psych meds: Wellbutrin  XL 450 mg AM, fluvoxamine  100 mg in AM and 300 mg HS, lorazepam  1 mg 1-2 mg in the AM and HS and 1 tablet prn midday for anxiety, temazepam  30 mg HS Patient received Spravato  84 mg today.  She tolerated it well without any unusual headache, nausea or vomiting or other somatic symptoms.  Dissociation did occur and she gradually saw resolution over the 2-hour period of observation.  She does not typically find the dissociation very strong. No SE complaints with meds. She has now realized that the rash she previously previously attributed to Auvelity  was not related.  She is interested may be retrying that after the holidays.  She is tolerating medications otherwise. The holidays remain chronically stressful to her due to family dynamic problems which cause her to consistently feel stuck.  Under more stress her OCD is worse.  She will have a tendency to have crying spells.   The depression and OCD are still improved with Spravato  as compared to before.  11/15/22 appt noted: Current psych meds: Wellbutrin  XL 450 mg AM, fluvoxamine  100 mg in AM and 300 mg HS, lorazepam  1 mg 1-2 mg in the AM and HS and 1 tablet prn midday for anxiety, temazepam  30 mg HS Patient received Spravato  84 mg today.  She tolerated it well without any unusual headache, nausea or vomiting or other somatic symptoms.  Dissociation did occur and she gradually saw resolution over the 2-hour period of observation.  She does not typically find the dissociation very strong. No SE complaints with meds. Continues to feel depressed and overwhelmed by family problems including her brother's mania and recent eviction and commitment.  Chronic OCD worse when stressed.  No SI.  Tolerating meds. Plan: Per her request continue Wellbutrin  XL 450 mg every morning. She has come to the realization that the rash she had previously attributed to Auvelity  was not related.  She is interested in perhaps retrying Auvelity . There are few alternative medication options that remain.  01/11/23 appt noted: Current psych meds: Wellbutrin  XL 150 mg BID and started Auvelity  1 AM, fluvoxamine  100 mg in AM and 300 mg HS, lorazepam  1 mg 1-2 mg in the AM and HS and 1 tablet prn midday for anxiety, temazepam  30 mg HS Patient received  Spravato  84 mg today.  She tolerated it well without any unusual headache, nausea or vomiting or other somatic symptoms.  Dissociation did occur and she gradually saw resolution over the 2-hour period of observation.  She does not typically find the dissociation very strong. No SE complaints with meds. Trouble getting to sessions lately DT transportation problems.   Continues to feel depressed and overwhelmed by OCD and family.  Feels she needs toevery other week Spravato  bc it helps for a couple of weeks and then seems to wear off.  Struggleing with OCD and depression both of which are eased by Spravato . Less  crying with Auvelity .  02/07/23 appt noted: Current psych meds: Wellbutrin  XL 150 mg BID and started Auvelity  1 AM, fluvoxamine  100 mg in AM and 300 mg HS, lorazepam  1 mg 1-2 mg in the AM and HS and 1 tablet prn midday for anxiety, temazepam  30 mg HS Patient received Spravato  84 mg today.  She tolerated it well without any unusual headache, nausea or vomiting or other somatic symptoms.  Dissociation did occur and she gradually saw resolution over the 2-hour period of observation.  She does not typically find the dissociation very strong. No SE complaints with meds. Trouble getting to sessions lately DT transportation problems.  This is a problem ongoing and thinks she might need to pause Spravato  bc won't be able to get her for at least 3 weeks. She is holding pretty steady with a moderate level of anxiety and depression ongoing and chronic.    03/20/23 appt noted: Current psych meds: Wellbutrin  XL 150 mg BID and started Auvelity  1 AM, fluvoxamine  100 mg in AM and 300 mg HS, lorazepam  1 mg 1-2 mg in the AM and HS and 1 tablet prn midday for anxiety, temazepam  30 mg HS Patient received Spravato  84 mg today.  She tolerated it well without any unusual headache, nausea or vomiting or other somatic symptoms.  Dissociation did occur and she gradually saw resolution over the 2-hour period of observation.  She does not typically find the dissociation very strong. No SE complaints with meds. She is trying to get back into more regular Spravato  administration.  Family issues and transportation problems that led to her missing Spravato .  She feels more depressed without regular Spravato .  Her OCD is chronic but also worse when she misses Spravato .  She does not want any medication changes.  04/03/23 appt noted: Current psych meds: Wellbutrin  XL 150 mg BID and started Auvelity  1 AM, fluvoxamine  100 mg in AM and 300 mg HS, lorazepam  1 mg 1-2 mg in the AM and HS and 1 tablet prn midday for anxiety, temazepam  30 mg  HS Patient received Spravato  84 mg today.  She tolerated it well without any unusual headache, nausea or vomiting or other somatic symptoms.  Dissociation did occur and she gradually saw resolution over the 2-hour period of observation.  She does not typically find the dissociation very strong.  It gets rid of negative emotion for awhile after procedure and would like it to last longer.   No SE complaints with meds.  Doesn't really want med changes.   Planning to weekly Spravato .  It is helping dep and anxiety.    04/10/23 appt noted: Current psych meds: Wellbutrin  XL 150 mg BID and started Auvelity  1 AM, fluvoxamine  100 mg in AM and 300 mg HS, lorazepam  1 mg 1-2 mg in the AM and HS and 1 tablet prn midday for anxiety, temazepam  30 mg HS Patient  received Spravato  84 mg today.  She tolerated it well without any unusual headache, nausea or vomiting or other somatic symptoms.  Dissociation did occur and she gradually saw resolution over the 2-hour period of observation.  She does not typically find the dissociation very strong.  It gets rid of negative emotion for awhile after procedure and would like it to last longer.   No SE complaints with meds.  Doesn't really want med changes.   Had lidocaine  shot for back pain with brief benefit.  Doing some water based PT Spravato  went well today. Got pretty upset last week with event related to son's activities.  Got upset with son saying something inappropriate publicly.  Was so embarrassed.  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    He's kind of rebellious lately.  Son is 38 yo.    05/03/23 appt noted: Current psych meds: Wellbutrin  XL 150 mg BID and started Auvelity  1 AM, fluvoxamine  100 mg in AM and 300 mg HS, lorazepam  1 mg 1-2 mg in the AM and HS and 1 tablet prn midday for anxiety, temazepam  30 mg HS No SE Patient received Spravato  84 mg today.  She tolerated it well without any unusual headache, nausea or  vomiting or other somatic symptoms.  Dissociation did occur and she gradually saw resolution over the 2-hour period of observation.  She does not typically find the dissociation very strong.  It gets rid of negative emotion for awhile after procedure and would like it to last longer.   Still pleased with benefit from Spravato  for mood and anxiety. No SE complaints with meds.  Doesn't really want med changes.   Chronic family px ongoing affects her but nothing she can change.H sick of the family drama.   Continuing counseling helps.  Has some support. Mood and anxiety pretty steady but did go on vacation to [place].  Anxiety worse first in AM and then later at night with mind racing on stressors.   Still back trouble.  05/23/23 appt noted: Current psych meds: Wellbutrin  XL 150 mg BID and started Auvelity  1 AM, fluvoxamine  100 mg in AM and 300 mg HS, lorazepam  1 mg 1-2 mg in the AM and HS and 1 tablet prn midday for anxiety, temazepam  30 mg HS No SE Patient received Spravato  84 mg today.  She tolerated it well without any unusual headache, nausea or vomiting or other somatic symptoms.  Dissociation did occur and she gradually saw resolution over the 2-hour period of observation.  She does not typically find the dissociation very strong.  It gets rid of negative emotion for awhile after procedure and would like it to last longer.   Still pleased with benefit from Spravato  for mood and anxiety by 50%. No SE complaints with meds. Chronic family stress interferes with mental health and self care.  May have to reduce frequency of Spravato  bc of this. But is status quo with meds.  Chronic residual OCD which is mod severe and dep moderate.  05/30/23 appt noted: Current psych meds: Wellbutrin  XL 150 mg BID and started Auvelity  1 AM, fluvoxamine  100 mg in AM and 300 mg HS, lorazepam  1 mg 1-2 mg in the AM and HS and 1 tablet prn midday for anxiety, temazepam  30 mg HS No SE Patient received Spravato  84 mg  today.  She tolerated it well without any unusual headache, nausea or vomiting or other somatic symptoms.  Dissociation did  occur and she gradually saw resolution over the 2-hour period of observation.  She does not typically find the dissociation very strong.  It gets rid of negative emotion for awhile after procedure and would like it to last longer.   Still pleased with benefit from Spravato  for mood and anxiety by 50% or better but not resolved. She wants to continue Spravato  as frequently as schedule will allow. Both depression and OCD are better with most improvement in mood.  Asks about any new treatments for OCD. No SE complaints with meds. Chronic family stress interferes with mental health and self care.  This is an ongoing drain on her mood and resources emotionally.    06/06/23 appt noted: Current psych meds: Wellbutrin  XL 150 mg BID and started Auvelity  1 AM, fluvoxamine  100 mg in AM and 300 mg HS, lorazepam  1 mg 1-2 mg in the AM and HS and 1 tablet prn midday for anxiety, temazepam  30 mg HS No SE Patient received Spravato  84 mg today.  She tolerated it well without any unusual headache, nausea or vomiting or other somatic symptoms.  Dissociation did occur and she gradually saw resolution over the 2-hour period of observation.  She does not typically find the dissociation very strong.  It gets rid of negative emotion for awhile after procedure and would like it to last longer.   Still believes each meds work and are helpful and well tolerated.  Specifically she thinks that the Auvelity  adds additional benefit on taking Wellbutrin .  She believes the meds help both depression and OCD though the OCD remains a problem.  She also believes Spravato  helps both types of symptoms as well.  Her mood is variable.  She experiences a great deal of stress from her family and those problems wax and wane.  She has bad days because of it and today is 1 of those days.  She is continuing counseling and that is  helpful.  Due to family demands she may have to spread out the Spravato  frequency.  06/16/23 appt noted:  Current psych meds: Wellbutrin  XL 150 mg BID and started Auvelity  1 AM, fluvoxamine  100 mg in AM and 300 mg HS, lorazepam  1 mg 1-2 mg in the AM and HS and 1 tablet prn midday for anxiety, temazepam  30 mg HS No SE Patient received Spravato  84 mg today.  She tolerated it well without any unusual headache, nausea or vomiting or other somatic symptoms.  Dissociation did occur and she gradually saw resolution over the 2-hour period of observation.  She does not typically find the dissociation very strong.  It gets rid of negative emotion for awhile after procedure . She is trying to make the Schedule work for Spravto oh every week or every other week because she is aware the treatment effects can be lost if it is time less frequently.  So she has been able to come the last 2 weeks, we will try to come in 2 weeks.  Spravato  reduces depression and OCD significantly though she remains highly symptomatic with both.  However she has no suicidal thoughts.  Crying spells are reduced with Spravato .  She still spends a fair amount of time meaning over an hour a day checking and more under stress or when tired.  She asks about any new meds for OCD. Plan: increase Auvelity  1 in AM & PM Hold Wellbutrin  XL 150 mg BID  07/12/23 appt noted: Current psych meds: Wellbutrin  XL 150 mg BID and started Auvelity  1 AM,  fluvoxamine  100 mg in AM and 300 mg HS, lorazepam  1 mg 1-2 mg in the AM and HS and 1 tablet prn midday for anxiety, temazepam  30 mg HS.  Didn't increase Auvelity  yet No SE Patient received Spravato  84 mg today.  She tolerated it well without any unusual headache, nausea or vomiting or other somatic symptoms.  Dissociation did occur and she gradually saw resolution over the 2-hour period of observation.  She does not typically find the dissociation very strong.  It gets rid of negative emotion for awhile after  procedure . She has not increased the Auvelity  yet is planned.  She has a little bit of nervousness about it but admits is not rational.  She is willing to give that a try to try to get better control of depression and anxiety.  She continues to see the benefit of Spravato  every other week.  She is struggling with some pain due to plantar fasciitis and given her cerebral palsy that makes it even more difficult to walk and to engage in normal activities.  She is willing to seek treatment for that.  07/25/23 appt noted: Current psych meds: Wellbutrin  XL 150 mg AM and started Auvelity  1 BID, fluvoxamine  100 mg in AM and 300 mg HS, lorazepam  1 mg 1-2 mg in the AM and HS and 1 tablet prn midday for anxiety, temazepam  30 mg HS.  Didn't increase Auvelity  yet No SE Patient received Spravato  84 mg today.  She tolerated it well without any unusual headache, nausea or vomiting or other somatic symptoms.  Dissociation did occur and she gradually saw resolution over the 2-hour period of observation.  She does not typically find the dissociation very strong.  It gets rid of negative emotion for awhile after procedure . Chronic dep and anxiety to some extent.  But has increased Auvelity  to BID recently and no SE yet.  Disc frequency Spravato  bc benefit and will try to continue every other week and not less if possible. No further concerns for meds.  08/08/23 appt noted: Current psych meds: Wellbutrin  XL 150 mg AM and Auvelity  1 BID, fluvoxamine  100 mg in AM and 300 mg HS, lorazepam  1 mg 1-2 mg in the AM and HS and 1 tablet prn midday for anxiety, temazepam  30 mg HS.  Didn't increase Auvelity  yet No SE with med changes. Patient received Spravato  84 mg today.  She tolerated it well without any unusual headache, nausea or vomiting or other somatic symptoms.  Dissociation did occur and she gradually saw resolution over the 2-hour period of observation.  She does not typically find the dissociation very strong.  It gets rid  of negative emotion for awhile after procedure . Mornings still worse with dep and anxiety.  But mood and anxiety are better with the increase in Auvelity  to BID.  OCD seems a little better.  Chronic difficulty handling the complaints and stress from her family.  Easily stresser her out.  No med changes desire. She will have to take a break from Spravato  DT pending foot surgery.  Mobility and transportation will be limited.  08/25/23 appt noted: Current psych meds: Wellbutrin  XL 150 mg AM and Auvelity  1 BID, fluvoxamine  100 mg in AM and 300 mg HS, lorazepam  1 mg 1-2 mg in the AM and HS and 1 tablet prn midday for anxiety, temazepam  30 mg HS.  Didn't increase Auvelity  yet No SE with med changes. Patient received Spravato  84 mg today.  She tolerated it well without any  unusual headache, nausea or vomiting or other somatic symptoms.  Dissociation did occur and she gradually saw resolution over the 2-hour period of observation.  She does not typically find the dissociation very strong.  It gets rid of negative emotion for awhile after procedure . Pending foot surgery next week.  Able to work in [employer]  this week which helps with dep and anxiety and OCD.  Struggles with sleep without multiple meds as noted.  No adverse effects.  Will return to [employer]  asap after foot surgery  10/04/23 appt noted: Current psych meds: Wellbutrin  XL 150 mg AM and Auvelity  1 BID, fluvoxamine  100 mg in AM and 300 mg HS, lorazepam  1 mg 1-2 mg in the AM and HS and 1 tablet prn midday for anxiety, temazepam  30 mg HS.  Didn't increase Auvelity  yet No SE with med changes. Patient received Spravato  84 mg today.  She tolerated it well without any unusual headache, nausea or vomiting or other somatic symptoms.  Dissociation did occur and she gradually saw resolution over the 2-hour period of observation.  She does not typically find the dissociation very strong.  It gets rid of negative emotion for awhile after procedure . Mood and  anxiety stable.  Benefit with meds and [employer] .   She reports OCD time consumption typically is much better than in the past when it was routinely 2 hours daily.  Now less than have of that.    Chronic caregiver stress with dysfunctional family and has trouble with boundaries.  Working on that in therapy.  10/24/23 appt noted: Current psych meds: Wellbutrin  XL 150 mg AM and Auvelity  1 BID, fluvoxamine  100 mg in AM and 300 mg HS, lorazepam  1 mg 1-2 mg in the AM and HS and 1 tablet prn midday for anxiety, temazepam  30 mg HS.  No SE with med changes. She plans to stop [employer]  bc couldn't get adequate transportation.  Her mood is continuing to be highly reactive with what's going on with family esp mother and brother.  Mother self absorbed and can be mean yet demanding.   She doesn't fear getting worse if she stops [place]  though this has happened in the past.   Lately less dep though can be pulled down quickly by family of origin interactions.  Narcissistic mother.  Thinking of PT work to try to keep her mind more occupied. Plan no changes  01/15/24 appt noted: Current psych meds: Wellbutrin  XL 150 mg AM and Auvelity  1 BID, fluvoxamine  100 mg in AM and 300 mg HS, lorazepam  1 mg 1-2 mg in the AM and HS and 1 tablet prn midday for anxiety, temazepam  30 mg HS. No SE with med changes. Patient received Spravato  84 mg today.  She tolerated it well without any unusual headache, nausea or vomiting or other somatic symptoms.  Dissociation did occur and she gradually saw resolution over the 2-hour period of observation.  She does not typically find the dissociation very strong.  It gets rid of negative emotion for awhile after procedure . She wanted to resume [employer]rav[employer]  bc 30% benefit for dep and anxiety.  Chronic struggles with OCD and family stress and handles it better when on [employer] . No problems with meds and no desire for changes.  01/16/24 appt noted: Current psych meds: Wellbutrin  XL 150 mg AM and  Auvelity  1 BID, fluvoxamine  100 mg in AM and 300 mg HS, lorazepam  1 mg 1-2 mg in the AM and HS and 1 tablet prn midday for anxiety, temazepam  30 mg  HS. No SE with med changes. Patient received Spravato  84 mg today.  She tolerated it well without any unusual headache, nausea or vomiting or other somatic symptoms.  Dissociation did occur and she gradually saw resolution over the 2-hour period of observation.  She does not typically find the dissociation very strong.  It gets rid of negative emotion for awhile after procedure . She wanted to resume [employer]  bc 30% benefit for dep and anxiety.  Chronic struggles with OCD and family stress and handles it better when on [place]rav[place] . Asks about dropping out of the Wellbutrin .  01/23/24 appt noted: Current psych meds: Wellbutrin  XL 150 mg AM and Auvelity  1 BID, fluvoxamine  100 mg in AM and 300 mg HS, lorazepam  1 mg 1-2 mg in the AM and HS and 1 tablet prn midday for anxiety, temazepam  30 mg HS. No SE with med changes. Patient received Spravato  84 mg today.  She tolerated it well without any unusual headache, nausea or vomiting or other somatic symptoms.  Dissociation did occur and she gradually saw resolution over the 2-hour period of observation.  She does not typically find the dissociation very strong.  It gets rid of negative emotion for awhile after procedure . She wanted to resume Spravato  bc 30% benefit for dep and anxiety.  Chronic struggles with OCD and family stress and handles it better when on [employer] . She is getting benefit from the Spravato  for depression and anxiety Plan no med changes  02/13/24 appt noted: Current psych meds: Wellbutrin  XL 150 mg AM and Auvelity  1 BID, fluvoxamine  100 mg in AM and 300 mg HS, lorazepam  1 mg 1-2 mg in the AM and HS and 1 tablet prn midday for anxiety, temazepam  30 mg HS. No SE with med changes. Patient received Spravato  84 mg today.  She tolerated it well without any unusual headache, nausea or vomiting or other  somatic symptoms.  Dissociation did occur and she gradually saw resolution over the 2-hour period of observation.  She does not typically find the dissociation very strong.  It gets rid of negative emotion for awhile after procedure . She continues to note considerable improvement in depression and anxiety when she misses provide a treatment on an every 2-week basis which is much as she can arrange given her transportation issues.  She is chronically stressed by her dysfunctional family of origin.  She recognizes she does not handle distressed easily.  She states benefit from the medication and does not want any changes  02/27/24 appt noted:  Current psych meds: Wellbutrin  XL 150 mg AM and Auvelity  1 BID, fluvoxamine  100 mg in AM and 300 mg HS, lorazepam  1 mg 1-2 mg in the AM and HS and 1 tablet prn midday for anxiety, temazepam  30 mg HS. No SE nor desire for med changes. Patient received Spravato  84 mg today.  She tolerated it well without any unusual headache, nausea or vomiting or other somatic symptoms.  Dissociation did occur and she gradually saw resolution over the 2-hour period of observation.  She does not typically find the dissociation very strong.  It gets rid of negative emotion for awhile after procedure . Able to leave without assistance. Benefit Spravato  for mood and OCD and wants to continue.  No desire for med change.  Ongoing stressors with family.  OCD is chronic.    03/12/2024 appointment noted: Current psych meds: Wellbutrin  XL 150 mg AM and Auvelity  1 BID, fluvoxamine  100 mg in AM and 300 mg HS, lorazepam  1 mg  1-2 mg in the AM and HS and 1 tablet prn midday for anxiety, temazepam  30 mg HS. No SE nor desire for med changes. Patient received Spravato  84 mg today.  She tolerated it well without any unusual headache, nausea or vomiting or other somatic symptoms.  Dissociation did occur and she gradually saw resolution over the 2-hour period of observation.  She does not typically find  the dissociation very strong.  No med changes desired. Continues to receive benefit from Spravato  and reducing depression and helping to moderate the OCD.  Specifically she is 50% less depressed and has about 25 to 30% less OCD using [employer]rav[employer] .  She is chronically stressed with a dysfunctional family.  She is working on boundary issues with her therapist.  03/26/24 appt noted: Current psych meds: Wellbutrin  XL 150 mg AM and Auvelity  1 BID, fluvoxamine  100 mg in AM and 300 mg HS, lorazepam  1 mg 1-2 mg in the AM and HS and 1 tablet prn midday for anxiety, temazepam  30 mg HS. No SE nor desire for med changes. Patient received Spravato  84 mg today.  She tolerated it well without any unusual headache, nausea or vomiting or other somatic symptoms.  Dissociation did occur and she gradually saw resolution over the 2-hour period of observation.  She does not typically find the dissociation very strong.  No med changes desired.  03/26/24 appt noted: Current psych meds: Wellbutrin  XL 150 mg AM and Auvelity  1 BID, fluvoxamine  100 mg in AM and 300 mg HS, lorazepam  1 mg 1-2 mg in the AM and HS and 1 tablet prn midday for anxiety, temazepam  30 mg HS. No SE nor desire for med changes. Patient received Spravato  84 mg today.  She tolerated it well without any unusual headache, nausea or vomiting or other somatic symptoms.  Dissociation did occur and she gradually saw resolution over the 2-hour period of observation.  She does not typically find the dissociation very strong.   Missed a couple of days of fluvoxamine  and had relapse worsening OCD.  It had been more under control for weeks priot to this past weekend.  Also struggling to keep up Spravato  which is helpful for OCD and dep.  Schedule px are ongoing.  06/03/24 appt noted:  Current psych meds: Wellbutrin  XL 150 mg AM and Auvelity  1 BID, fluvoxamine  100 mg in AM and 300 mg HS, lorazepam  1 mg 1-2 mg in the AM and HS and 1 tablet prn midday for anxiety, temazepam  30 mg  HS. No SE nor desire for med changes. She has put a hold on [place]ravato  for now due to transportation problems. Most days took all 5 lorazepam .  One day forgot. Has been thinking about resuming [place]ravato  back to every other week.  Situation with family is the same.  It's really hard.  When on Spravato  gets more relief.  Otherwise tends to dwell on catastrophic things.  She was not coming for Spravato  DT transportation issues.  She thinks she needs to try to work it out. Plan: TRIAL POTENTIATION WITH CLOMIPRAMINE  25 MG hs .  Could go higher.   08/08/24 appt noted:  Current psych meds: Wellbutrin  XL 150 mg AM and Auvelity  1 BID, fluvoxamine  100 mg in AM and 300 mg HS, lorazepam  1 mg 1-2 mg in the AM and HS and 1 tablet prn midday for anxiety, temazepam  30 mg HS. No SE nor desire for med changes. Never tried clomipramine  bc fearful of dry mouth which is already a problem.  Things  are hard with mother and family.  Life is too hard for me; I'm not meant for this world.   Son has px bc school problems, cries a lot .  Feel like it is all my fault bc he has autism.  She feels guilty she might have caused his autism.  Son 65 yo.   Still gets drug down in mood by psychosocial stressors.     Previous psych med trials include  Prozac, paroxetine, sertraline, fluvoxamine , venlafaxine, Anafranil  with no response,  Wellbutrin , Viibryd, Trintellix 10 1 month NR Auvelity  BID   Geodon,  risperidone, Rexulti, Abilify,  Seroquel, Latuda 40 mg with irritability.   lamotrigine lithium,  BuSpar, Namenda,  pramipexole with no response, and Topamax, pindolol  2 brothers & 1 sister poverty, 1 B with untreated bipolar disorder lives with mother  ECT-MADRS    Flowsheet Row Office Visit from 06/29/2021 in [facility]  MADRS Total Score 36   Flowsheet Row Admission (Discharged) from 06/11/2021 in [facility]  C-SSRS RISK CATEGORY No Risk    Review of Systems:   Review of Systems  Constitutional:  Positive for fatigue.  Respiratory:  Positive for cough.   Cardiovascular:  Negative for palpitations.  Musculoskeletal:  Positive for arthralgias, back pain, gait problem, myalgias and neck pain.  Neurological:  Positive for weakness and headaches. Negative for tremors.  Psychiatric/Behavioral:  Positive for dysphoric mood and sleep disturbance. Negative for confusion and suicidal ideas. The patient is nervous/anxious.     Medications: I have reviewed the patient's current medications.  Current Outpatient Medications  Medication Sig Dispense Refill  . Abaloparatide (TYMLOS) 3120 MCG/1.56ML SOPN Inject into the skin.    . AUVELITY  45-105 MG TBCR TAKE 1 TABLET BY MOUTH IN THE MORNING AND IN THE EVENING 60 tablet 1  . Azelastine-Fluticasone  137-50 MCG/ACT SUSP Place 1-2 sprays into both nostrils daily.    . baclofen  (LIORESAL ) 10 MG tablet Take 20 mg by mouth at bedtime as needed for muscle spasms.    . buPROPion  (WELLBUTRIN  XL) 150 MG 24 hr tablet TAKE 1 TABLET (150 MG TOTAL) BY MOUTH IN THE MORNING 30 tablet 2  . clomiPRAMINE  (ANAFRANIL ) 25 MG capsule TAKE 1 CAPSULE BY MOUTH AT BEDTIME. 30 capsule 0  . dicyclomine (BENTYL) 10 MG capsule Take 10 mg by mouth daily.    . docusate sodium  (COLACE) 100 MG capsule Take 1 capsule (100 mg total) by mouth 2 (two) times daily. (Patient taking differently: Take 100 mg by mouth daily.) 10 capsule 0  . fexofenadine (ALLEGRA) 180 MG tablet Take 180 mg by mouth daily.    . fluvoxaMINE  (LUVOX ) 100 MG tablet TAKE 1 TABLET IN THE AM AND 3 TABLETS AT NIGHT 120 tablet 1  . hydrocortisone  (ANUSOL -HC) 2.5 % rectal cream Place rectally 2 (two) times daily. x 7-14 days 30 g 0  . ketotifen  (ZADITOR ) 0.025 % ophthalmic solution Place 3 drops into both eyes at bedtime.    . LORazepam  (ATIVAN ) 1 MG tablet TAKE 1-2 TABS EVERY MORNING,1 TAB IN THE AFTERNOON, & 1-2 TAB AT BEDTIME IF NEEDED FOR ANXIETY/SLEEP 150 tablet 1  . magnesium  gluconate (MAGONATE) 500 MG tablet Take 500 mg by mouth daily.    . meloxicam  (MOBIC ) 15 MG tablet Take 1 tablet (15 mg total) by mouth daily. 30 tablet 0  . meloxicam  (MOBIC ) 15 MG tablet TAKE 1 TABLET (15 MG TOTAL) BY MOUTH DAILY. 30 tablet 0  . MIBELAS 24 FE 1-20  MG-MCG(24) CHEW Chew 1 tablet by mouth at bedtime as needed (bowel regularity).    . Multiple Vitamins-Minerals (ADULT GUMMY PO) Take 2 tablets by mouth in the morning.    . nitrofurantoin (MACRODANTIN) 100 MG capsule Take 100 mg by mouth as needed (For urinary tract infection.).     SABRA polyethylene glycol (MIRALAX  / GLYCOLAX ) packet Take 17 g by mouth daily as needed for mild constipation. 14 each 0  . propranolol  ER (INDERAL  LA) 60 MG 24 hr capsule TAKE 1 CAPSULE BY MOUTH EVERY DAY 30 capsule 2  . psyllium (METAMUCIL) 58.6 % powder Take 1 packet by mouth daily as needed (constipation).    . SUMAtriptan  (IMITREX ) 100 MG tablet TAKE 1 TAB EVERY 2 HOURS AS NEEDED FOR MIGRAINE. MAY REPEAT IN 2 HRS IF HEADACHE PERSISTS OR RECURS 9 tablet 3  . temazepam  (RESTORIL ) 30 MG capsule Take 1 capsule by mouth at bedtime as needed for sleep 30 capsule 0  . Vitamin D-Vitamin K (VITAMIN K2-VITAMIN D3 PO) Take 1-2 sprays by mouth daily.    . Esketamine HCl, 84 MG Dose, (SPRAVATO , 84 MG DOSE,) 28 MG/DEVICE SOPK USE 3 SPRAYS IN EACH NOSTRIL ONCE A WEEK (Patient not taking: Reported on 08/08/2024) 3 each 1  . methylPREDNISolone  (MEDROL  DOSEPAK) 4 MG TBPK tablet Take as directed (Patient not taking: Reported on 08/08/2024) 21 each 0  . oxyCODONE -acetaminophen  (PERCOCET/ROXICET) 5-325 MG tablet Take 1-2 tablets by mouth every 6 (six) hours as needed for severe pain. (Patient not taking: Reported on 08/08/2024) 50 tablet 0   No current facility-administered medications for this visit.    Medication Side Effects: None   Allergies:  Allergies  Allergen Reactions  . Hydrocodone  Itching  . Sulfamethoxazole-Trimethoprim Itching  . Dust Mite Extract Other  (See Comments)    Sneezing, watery eyes, runny nose  . Latex Itching  . Other Other (See Comments)    PT IS ALLERGIC TO CAT DANDER AND RAGWEED - Sneezing, watery eyes, runny nose   . Pollen Extract Other (See Comments)    Sneezing, watery eyes, runny nose     Past Medical History:  Diagnosis Date  . 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 Anxiety   . Cerebral palsy (HCC)    right arm/leg  . Cystocele   . Depression   . Headache   . Neuromuscular disorder (HCC)    Cerebral Palsy  . OCD (obsessive compulsive disorder)   . Osteoporosis   . Uterine prolaps     Family History  Problem Relation Age of Onset  . Cancer Father        skin AND LUNG  . Alcohol abuse Sister        CRACK COCAINE    Social History   Socioeconomic History  . Marital status: Married    Spouse name: Not on file  . Number of children: Not on file  . Years of education: Not on file  . Highest education level: Not on file  Occupational History  . Not on file  Tobacco Use  . Smoking status: Never  . Smokeless tobacco: Never  Substance and Sexual Activity  . Alcohol use: Not Currently    Comment: OCCASIONAL beer  . Drug use: No  . Sexual activity: Yes    Birth control/protection: Pill    Comment: LOESTRIN 24 FE  Other Topics Concern  . Not on file  Social History Narrative  . Not on file   Social Drivers of Health  Financial Resource Strain: Not on file  Food Insecurity: Not on file  Transportation Needs: Not on file  Physical Activity: Not on file  Stress: Not on file  Social Connections: Not on file  Intimate Partner Violence: Not on file    Past Medical History, Surgical history, Social history, and Family history were reviewed and updated as appropriate.   Please see review of systems for further details on the patient's review from today.   Objective:   Physical Exam:  LMP  (LMP Unknown)   Physical Exam Constitutional:      General: She is not in acute  distress. Neurological:     Mental Status: She is alert and oriented to person, place, and time.     Cranial Nerves: No dysarthria.     Motor: Weakness present.     Gait: Gait abnormal.  Psychiatric:        Attention and Perception: Attention and perception normal.        Mood and Affect: Mood is anxious and depressed. Affect is tearful. Affect is not labile.        Speech: Speech normal. Speech is not rapid and pressured or slurred.        Behavior: Behavior normal. Behavior is cooperative.        Thought Content: Thought content normal. Thought content is not delusional. Thought content does not include homicidal or suicidal ideation. Thought content does not include suicidal plan.        Cognition and Memory: Cognition and memory normal. Cognition is not impaired.        Judgment: Judgment normal.     Comments: Insight intact Ongoing OCD remains fairly severe but less anxious Checking compulsions now about 30 mins.   Chronic depression persistent and also worsened by stressors.       Lab Review:     Component Value Date/Time   NA 138 06/11/2021 0606   K 4.0 06/11/2021 0606   CL 107 06/11/2021 0606   CO2 26 06/11/2021 0606   GLUCOSE 90 06/11/2021 0606   BUN 18 06/11/2021 0606   CREATININE 0.81 06/11/2021 0606   CALCIUM 9.4 06/11/2021 0606   PROT 6.5 06/11/2021 0606   ALBUMIN  3.3 (L) 06/11/2021 0606   AST 17 06/11/2021 0606   ALT 14 06/11/2021 0606   ALKPHOS 141 (H) 06/11/2021 0606   BILITOT 0.2 (L) 06/11/2021 0606   GFRNONAA >60 06/11/2021 0606   GFRAA >60 07/09/2016 0438       Component Value Date/Time   WBC 5.8 06/11/2021 0606   RBC 4.12 06/11/2021 0606   HGB 12.5 06/11/2021 0606   HCT 39.7 06/11/2021 0606   PLT 299 06/11/2021 0606   MCV 96.4 06/11/2021 0606   MCH 30.3 06/11/2021 0606   MCHC 31.5 06/11/2021 0606   RDW 13.9 06/11/2021 0606   LYMPHSABS 1.9 06/11/2021 0606   MONOABS 0.5 06/11/2021 0606   EOSABS 0.1 06/11/2021 0606   BASOSABS 0.0 06/11/2021  0606    No results found for: POCLITH, LITHIUM   No results found for: PHENYTOIN, PHENOBARB, VALPROATE, CBMZ   .res Assessment: Plan:    Arilynn Blakeney was seen today for follow-up, depression, anxiety and sleeping problem.  Diagnoses and all orders for this visit:  Recurrent major depression resistant to treatment  Mixed obsessional thoughts and acts  Social anxiety disorder  Insomnia due to mental condition  Migraine without aura and without status migrainosus, not intractable    Chronic enmeshment with mother is ongoing problem.  60 min session:   Both primary Dx of OCD and major depression are TR and marked.   She was receiving Spravato  84 mg weekly and moderate improvement in the depression..  she feels it also helps OCD somewhat.  However still easily overwhelmed with low stress tolerance.  Family contributes to her anxiety and stress markedly and triggers mood changes Needed to stop Spravato .  The OCD is improved with the increase in fluvoxamine  and with Spravato .  Was Spending up to 2 hours daily and checking compulsions on her worst days but better when she travels and also reduced to about 30 min when on Spravato . .  No new options for tx are evident.  She has been on higher doses of fluvoxamine  above the usual max of 400 mg daily in the past and finds it more helpful at the higher dose.    Disc SE.   She is tolerating the meds well  Continue  Luvox  back to 400 mg nightly as of January 2023. Disc dosing higher than usual.  She feels this is increase has helped more with OCD which remains chronically severe.  Disc even higher than usual dosing.   She had worsening sx after missing a couple of days out of town dosing.   Disc SE in detail and SSRI withdrawal sx.  Continue Auvelity  BID and reduced Wellbutrin  to 1 AM. It has helped and is tolerated.   We have discussed seizure risk that is possible using this combination but given severity of her symptoms  she feels the risk is warranted.  Do not consider stopping this until she gets the full benefit of Spravato .  Disc dosing again. There are few other alternative medication options that remain.   Disc DDI issues.   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 She doesn't want to try this now.  We discussed the short-term risks associated with benzodiazepines including sedation and increased fall risk among others.  Discussed long-term side effect risk including dependence, potential withdrawal symptoms, and the potential eventual dose-related risk of dementia.  But recent studies from 2020 dispute this association between benzodiazepines and dementia risk. Newer studies in 2020 do not support an association with dementia. Disc this is high dose and not ideal.  Also disc risk combining it with temazepam . Rec try gradually reduce HS lorazepam  to 1 mg Hs.  Can continue lorazepam  2 mg AM and 1 mg in afternoon bc of chronic anxiety and it is helpful and tolerated. She can continue temazepam  30 mg nightly.  She tends to have a lot of anxious negative thoughts at night when she is trying to go to bed.  She is trying to reduce the dose.  Complaining of HA and history migraine.  Asks for increase imitrex  and disc preventatives like propranolol  ER imitrex  to 100 mg prn migraine and propranolol  ER for migraine prevention.  CBT around cog distortions of internalizing the problems of other people.  Chronic enmeshment with mother is ongoing problem.Tends to think the worst.   Confronted this clearly.  Boundaries with mother.    continue Auvelity  1 in AM & PM continue Wellbutrin  XL 150 mg AM Fluvoxamine  100 mg AM and 300 mg PM above usual max bc med necessary & tolerated. Lorazepam  2 mg HS and prn 1 mg BID prn anxiety daily Temazepam  30 mg HS Propranolol  ER 60 mg daily Imitrex  prn migraine.  She wants to increase #12/month  TRIAL POTENTIATION WITH CLOMIPRAMINE  25 MG hs .  Could go  higher.  Call in month if not better.  FU 1-2 mos   Lorene Macintosh, MD, DFAPA  Please see After Visit Summary for patient specific instructions.  Future Appointments  Date Time Provider Department Center  08/23/2024  2:00 PM Marijean Charleston, PhD [facility] None  08/26/2024 11:00 AM Beather Delon Gibson, PA [facility]  09/11/2024  1:00 PM Marijean Charleston, PhD [facility] None  09/25/2024  1:00 PM Marijean Charleston, PhD [facility] None  10/16/2024  1:00 PM Marijean Charleston, PhD [facility] None    No orders of the defined types were placed in this encounter.    -------------------------------

## 2024-08-09 ENCOUNTER — Ambulatory Visit: Admitting: Psychiatry

## 2024-08-14 NOTE — Telephone Encounter (Signed)
 I called pt to schedule AFO brace fitting and left a VM.   (30 min appt)

## 2024-08-15 ENCOUNTER — Other Ambulatory Visit: Payer: Self-pay | Admitting: Psychiatry

## 2024-08-15 DIAGNOSIS — F4010 Social phobia, unspecified: Secondary | ICD-10-CM

## 2024-08-19 ENCOUNTER — Ambulatory Visit (INDEPENDENT_AMBULATORY_CARE_PROVIDER_SITE_OTHER)

## 2024-08-19 DIAGNOSIS — R2681 Unsteadiness on feet: Secondary | ICD-10-CM

## 2024-08-19 DIAGNOSIS — G809 Cerebral palsy, unspecified: Secondary | ICD-10-CM

## 2024-08-19 DIAGNOSIS — M62461 Contracture of muscle, right lower leg: Secondary | ICD-10-CM | POA: Diagnosis not present

## 2024-08-22 NOTE — Progress Notes (Signed)
 Patient was present and fit with walk on AFO  Brace increases propulsion and toe off improved stability and gait  Patient is happy with fit and function  Patient was given wear care and break in instructions and will call office with any problems she may have   Casey Diaz Cped, CFo, CFm

## 2024-08-23 ENCOUNTER — Ambulatory Visit: Admitting: Psychiatry

## 2024-08-23 NOTE — Progress Notes (Deleted)
 Chief Complaint: Hemorrhoids  HPI:    Casey Diaz is a 58 year old female with a past medical history as listed below including cerebral palsy with right spastic hemiplegia, anxiety and multiple others, who was referred to me by Onita Rush, MD for a complaint of hemorrhoids.     Past Medical History:  Diagnosis Date   Abnormal Pap smear 2011   hpv/mild dysplasia,cin1   Anxiety    Cerebral palsy (HCC)    right arm/leg   Cystocele    Depression    Headache    Neuromuscular disorder (HCC)    Cerebral Palsy   OCD (obsessive compulsive disorder)    Osteoporosis    Uterine prolaps     Past Surgical History:  Procedure Laterality Date   COLPOSCOPY  2011   ELBOW SURGERY     left elbow-separation of bones -age 18   ELBOW SURGERY  2015   fell on ice- right elbow fracture   FRACTURE SURGERY     HAMMER TOE SURGERY  1/13   RIGHT SIDE   HIP PINNING,CANNULATED Left 07/08/2016   Procedure: LEFT CLOSED REDUCTION HIP AND PERCUTANEOUS SCREW;  Surgeon: Donnice Car, MD;  Location: [facility];  Service: Orthopedics;  Laterality: Left;   ORIF HUMERUS FRACTURE Right 06/11/2021   Procedure: OPEN REDUCTION INTERNAL FIXATION (ORIF) DISTAL HUMERUS FRACTURE;  Surgeon: Celena Sharper, MD;  Location: [facility];  Service: Orthopedics;  Laterality: Right;   WRIST SURGERY  2005   left wrist    Current Outpatient Medications  Medication Sig Dispense Refill   Abaloparatide (TYMLOS) 3120 MCG/1.56ML SOPN Inject into the skin.     AUVELITY  45-105 MG TBCR TAKE 1 TABLET BY MOUTH IN THE MORNING AND IN THE EVENING 60 tablet 1   Azelastine-Fluticasone  137-50 MCG/ACT SUSP Place 1-2 sprays into both nostrils daily.     baclofen  (LIORESAL ) 10 MG tablet Take 20 mg by mouth at bedtime as needed for muscle spasms.     buPROPion  (WELLBUTRIN  XL) 150 MG 24 hr tablet TAKE 1 TABLET (150 MG TOTAL) BY MOUTH IN THE MORNING 30 tablet 2   clomiPRAMINE  (ANAFRANIL ) 25 MG capsule TAKE 1 CAPSULE BY MOUTH AT BEDTIME. 30 capsule 0    dicyclomine (BENTYL) 10 MG capsule Take 10 mg by mouth daily.     docusate sodium  (COLACE) 100 MG capsule Take 1 capsule (100 mg total) by mouth 2 (two) times daily. (Patient taking differently: Take 100 mg by mouth daily.) 10 capsule 0   Esketamine HCl, 84 MG Dose, (SPRAVATO , 84 MG DOSE,) 28 MG/DEVICE SOPK USE 3 SPRAYS IN EACH NOSTRIL ONCE A WEEK (Patient not taking: Reported on 08/08/2024) 3 each 1   fexofenadine (ALLEGRA) 180 MG tablet Take 180 mg by mouth daily.     fluvoxaMINE  (LUVOX ) 100 MG tablet TAKE 1 TABLET IN THE AM AND 3 TABLETS AT NIGHT 120 tablet 1   hydrocortisone  (ANUSOL -HC) 2.5 % rectal cream Place rectally 2 (two) times daily. x 7-14 days 30 g 0   ketotifen  (ZADITOR ) 0.025 % ophthalmic solution Place 3 drops into both eyes at bedtime.     LORazepam  (ATIVAN ) 1 MG tablet TAKE 1-2 TABS EVERY MORNING,1 TAB IN THE AFTERNOON, & 1-2 TAB AT BEDTIME IF NEEDED FOR ANXIETY/SLEEP 150 tablet 1   magnesium gluconate (MAGONATE) 500 MG tablet Take 500 mg by mouth daily.     meloxicam  (MOBIC ) 15 MG tablet Take 1 tablet (15 mg total) by mouth daily. 30 tablet 0   meloxicam  (MOBIC ) 15 MG tablet  TAKE 1 TABLET (15 MG TOTAL) BY MOUTH DAILY. 30 tablet 0   methylPREDNISolone  (MEDROL  DOSEPAK) 4 MG TBPK tablet Take as directed (Patient not taking: Reported on 08/08/2024) 21 each 0   MIBELAS 24 FE 1-20 MG-MCG(24) CHEW Chew 1 tablet by mouth at bedtime as needed (bowel regularity).     Multiple Vitamins-Minerals (ADULT GUMMY PO) Take 2 tablets by mouth in the morning.     nitrofurantoin (MACRODANTIN) 100 MG capsule Take 100 mg by mouth as needed (For urinary tract infection.).      oxyCODONE -acetaminophen  (PERCOCET/ROXICET) 5-325 MG tablet Take 1-2 tablets by mouth every 6 (six) hours as needed for severe pain. (Patient not taking: Reported on 08/08/2024) 50 tablet 0   polyethylene glycol (MIRALAX  / GLYCOLAX ) packet Take 17 g by mouth daily as needed for mild constipation. 14 each 0   propranolol  ER (INDERAL   LA) 60 MG 24 hr capsule TAKE 1 CAPSULE BY MOUTH EVERY DAY 30 capsule 2   psyllium (METAMUCIL) 58.6 % powder Take 1 packet by mouth daily as needed (constipation).     SUMAtriptan  (IMITREX ) 100 MG tablet TAKE 1 TAB EVERY 2 HOURS AS NEEDED FOR MIGRAINE. MAY REPEAT IN 2 HRS IF HEADACHE PERSISTS OR RECURS 9 tablet 3   temazepam  (RESTORIL ) 30 MG capsule Take 1 capsule by mouth at bedtime as needed for sleep 30 capsule 0   Vitamin D-Vitamin K (VITAMIN K2-VITAMIN D3 PO) Take 1-2 sprays by mouth daily.     No current facility-administered medications for this visit.    Allergies as of 08/26/2024 - Review Complete 08/08/2024  Allergen Reaction Noted   Hydrocodone  Itching 04/23/2015   Sulfamethoxazole-trimethoprim Itching 04/13/2016   Dust mite extract Other (See Comments) 07/27/2012   Latex Itching 07/07/2016   Other Other (See Comments) 07/27/2012   Pollen extract Other (See Comments) 07/27/2012    Family History  Problem Relation Age of Onset   Cancer Father        skin AND LUNG   Alcohol abuse Sister        CRACK COCAINE    Social History   Socioeconomic History   Marital status: Married    Spouse name: Not on file   Number of children: Not on file   Years of education: Not on file   Highest education level: Not on file  Occupational History   Not on file  Tobacco Use   Smoking status: Never   Smokeless tobacco: Never  Substance and Sexual Activity   Alcohol use: Not Currently    Comment: OCCASIONAL beer   Drug use: No   Sexual activity: Yes    Birth control/protection: Pill    Comment: LOESTRIN 24 FE  Other Topics Concern   Not on file  Social History Narrative   Not on file   Social Drivers of Health   Financial Resource Strain: Not on file  Food Insecurity: Not on file  Transportation Needs: Not on file  Physical Activity: Not on file  Stress: Not on file  Social Connections: Not on file  Intimate Partner Violence: Not on file    Review of Systems:     Constitutional: No weight loss, fever, chills, weakness or fatigue HEENT: Eyes: No change in vision               Ears, Nose, Throat:  No change in hearing or congestion Skin: No rash or itching Cardiovascular: No chest pain, chest pressure or palpitations   Respiratory: No SOB or cough Gastrointestinal: See  HPI and otherwise negative Genitourinary: No dysuria or change in urinary frequency Neurological: No headache, dizziness or syncope Musculoskeletal: No new muscle or joint pain Hematologic: No bleeding or bruising Psychiatric: No history of depression or anxiety    Physical Exam:  Vital signs: LMP  (LMP Unknown)   Constitutional:   Pleasant Caucasian female appears to be in NAD, Well developed, Well nourished, alert and cooperative Head:  Normocephalic and atraumatic. Eyes:   PEERL, EOMI. No icterus. Conjunctiva pink. Ears:  Normal auditory acuity. Neck:  Supple Throat: Oral cavity and pharynx without inflammation, swelling or lesion.  Respiratory: Respirations even and unlabored. Lungs clear to auscultation bilaterally.   No wheezes, crackles, or rhonchi.  Cardiovascular: Normal S1, S2. No MRG. Regular rate and rhythm. No peripheral edema, cyanosis or pallor.  Gastrointestinal:  Soft, nondistended, nontender. No rebound or guarding. Normal bowel sounds. No appreciable masses or hepatomegaly. Rectal:  Not performed.  Msk:  Symmetrical without gross deformities. Without edema, no deformity or joint abnormality.  Neurologic:  Alert and  oriented x4;  grossly normal neurologically.  Skin:   Dry and intact without significant lesions or rashes. Psychiatric: Oriented to person, place and time. Demonstrates good judgement and reason without abnormal affect or behaviors.  RELEVANT LABS AND IMAGING: CBC    Component Value Date/Time   WBC 5.8 06/11/2021 0606   RBC 4.12 06/11/2021 0606   HGB 12.5 06/11/2021 0606   HCT 39.7 06/11/2021 0606   PLT 299 06/11/2021 0606   MCV 96.4  06/11/2021 0606   MCH 30.3 06/11/2021 0606   MCHC 31.5 06/11/2021 0606   RDW 13.9 06/11/2021 0606   LYMPHSABS 1.9 06/11/2021 0606   MONOABS 0.5 06/11/2021 0606   EOSABS 0.1 06/11/2021 0606   BASOSABS 0.0 06/11/2021 0606    CMP     Component Value Date/Time   NA 138 06/11/2021 0606   K 4.0 06/11/2021 0606   CL 107 06/11/2021 0606   CO2 26 06/11/2021 0606   GLUCOSE 90 06/11/2021 0606   BUN 18 06/11/2021 0606   CREATININE 0.81 06/11/2021 0606   CALCIUM 9.4 06/11/2021 0606   PROT 6.5 06/11/2021 0606   ALBUMIN  3.3 (L) 06/11/2021 0606   AST 17 06/11/2021 0606   ALT 14 06/11/2021 0606   ALKPHOS 141 (H) 06/11/2021 0606   BILITOT 0.2 (L) 06/11/2021 0606   GFRNONAA >60 06/11/2021 0606   GFRAA >60 07/09/2016 0438    Assessment: 1.  Hemorrhoids  Plan: 1. ***     Delon Failing, PA-C [facility] 08/23/2024, 10:12 AM  Cc: Onita Rush, MD

## 2024-08-25 ENCOUNTER — Other Ambulatory Visit: Payer: Self-pay | Admitting: Psychiatry

## 2024-08-25 DIAGNOSIS — F422 Mixed obsessional thoughts and acts: Secondary | ICD-10-CM

## 2024-08-26 ENCOUNTER — Ambulatory Visit: Admitting: Physician Assistant

## 2024-08-26 ENCOUNTER — Other Ambulatory Visit: Payer: Self-pay | Admitting: Psychiatry

## 2024-08-26 DIAGNOSIS — F422 Mixed obsessional thoughts and acts: Secondary | ICD-10-CM

## 2024-08-26 DIAGNOSIS — F339 Major depressive disorder, recurrent, unspecified: Secondary | ICD-10-CM

## 2024-08-27 NOTE — Progress Notes (Signed)
 [facility] [place]  Orthopaedic Office Note  Chief complaint: Right foot pain   History of Present Illness: She is a 58 year old known to me who returns today 1 year after excision of some bossing of the right anterior calcaneus and right first tarsometatarsal joint.  She states her foot hurts more now that it is colder.  She is not wearing orthotic on the side as this seemed to aggravate some left sciatica.  She gets foot pain on and off.  Physical Exam: The patient is alert and attentive, pleasant and cooperative, well kempt, and of stated age.  Good affect.  No distress.  Overall good foot shape with low pronation.  No tender area.  Full motion of the ankle and hindfoot and midfoot in all planes without pain.  Stable in all directions without pain.  Good strength in all distributions without pain.  No pain in translation of metatarsals.  Sensorimotor functions are intact per usual increased muscle tone in the right arm and leg.  She walks well with the usual limp.  She is wearing a regular shoe.  Today I reviewed her note from Dr. Tobie and podiatry from January 26, 2024 and her note from Lolita Schultze about orthotics from August 20, 2019   Assessment: Right foot pain in a patient with right hemiplegic cerebral palsy   Plan: I reassured her I am very pleased with her exam today.  She has a good insert in her shoe and I suggested maybe we try a medial wedge for work.  Will order that and see her on an as-needed basis.  Treatment risk category low.  X-ray: No image results found.   Allergies  Hydrocodone , Sulfamethoxazole-trimethoprim, Bee pollen, Lactose intolerance (lactase), Latex, and Mite extract  Medications    Current Medications[1]  Past Medical History  Medical History[2]  Past Surgical History  Surgical History[3]  Family History  Family History[4]  Social History:  Social History   Socioeconomic History   . Marital status: Married    Spouse name: Not on file  . Number of children: Not on file  . Years of education: Not on file  . Highest education level: Not on file  Occupational History  . Not on file  Tobacco Use  . Smoking status: Former  . Smokeless tobacco: Former  . Tobacco comments:    smoked in college and 32 years ago.  Substance and Sexual Activity  . Alcohol use: Not Currently    Comment: very rare  . Drug use: Never  . Sexual activity: Not on file  Other Topics Concern  . Not on file  Social History Narrative  . Not on file   Social Drivers of Health   Food Insecurity: Not on file  Transportation Needs: Not on file  Safety: Not on file  Living Situation: Unknown (06/24/2024)   Received from [facility]   Living Situation   . Unable to Pay for Housing in the Last Year: Not on file   . Number of Times Moved in the Last Year: Not on file   . At any time in the past 12 months, were you homeless or living in a shelter (including now)?: No    Review of Systems   Vital Signs  BP 109/75   Pulse 90   Temp 98 F (36.7 C) (Temporal)   Ht 1.702 m (5' 7)   Wt 88.5 kg (195 lb)   BMI 30.54 kg/m  Body mass index is 30.54 kg/m.  Test Results  No results found for this or any previous visit. No results found for this or any previous visit (from the past 48 hours).  Problem List  Problem List[5]     Ozell PARAS. Duwaine, MD       [1] Current Outpatient Medications  Medication Sig Dispense Refill  . abaloparatide (Tymlos) 80 mcg (3,120 mcg/1.56 mL) pnij Inject 80 mcg under the skin nightly.    . buPROPion  (WELLBUTRIN  XL) 300 mg 24 hr tablet Take 300 mg by mouth Once Daily. Indications: depression    . cholecalciferol (VITAMIN D3) 125 mcg (5,000 unit) capsule Take 5,000 Units by mouth daily.    SABRA dextromethorphan-bupropion  (Auvelity ) 45-105 mg TbIE Take by mouth 2 (two) times a day.    . fluticasone  propionate (FLONASE ) 50 mcg/spray nasal  spray 2 sprays.    . fluvoxaMINE  (LUVOX ) 50 mg tablet 6 at night    . LORazepam  (ATIVAN ) 1 mg tablet Take 1 mg by mouth in the morning and 1 mg in the evening. 2 tabs in am, 2 tabs pm.    . magnesium oxide 400 mg (241 mg magnesium) tab Take 400 mg by mouth daily.    . meloxicam  (MOBIC ) 7.5 mg tablet TAKE 1 TABLET BY MOUTH DAILY FOR 30 DAYS. 30 tablet 0  . NON FORMULARY SCREEN SHIELD PRO--(EYE HEALTH VITAMIN)    . soy isofla-blk cohosh-mag bark (Estroven) 155 mg cap Take 1 capsule by mouth daily.    . SUMAtriptan  (IMITREX ) 100 mg tablet Take 100 mg by mouth.    . temazepam  (RESTORIL ) 30 mg capsule Take 30 mg by mouth.     No current facility-administered medications for this visit.  [2] Past Medical History: Diagnosis Date  . Anxiety   . Cerebral palsy   . Depression   . Osteoporosis   [3] Past Surgical History: Procedure Laterality Date  . HIP SURGERY Left    Procedure: HIP SURGERY  . OTHER SURGICAL HISTORY     Procedure: OTHER SURGICAL HISTORY (right elbow)  . TOE SURGERY Right 08/24/2023   EXCISION TOE METATARSAL / BONY PROMINENCES OF RIGHT FOOT performed by Ozell PARAS Duwaine, MD at [facility] OR  [4] Family History Problem Relation Name Age of Onset  . Anesthesia problems Neg Hx    . Clotting disorder Neg Hx    [5] Patient Active Problem List Diagnosis  . Pronation deformity of right foot  . Bone spur of right foot  . Chronic pain of right ankle  . Unsteady gait when walking  . Sprain of right ankle  . Hemiplegic cerebral palsy (HCC)  . Peroneal tendonitis, right  . Synovitis of right foot  . Exostosis of bone of foot

## 2024-08-28 ENCOUNTER — Emergency Department (HOSPITAL_BASED_OUTPATIENT_CLINIC_OR_DEPARTMENT_OTHER)
Admission: EM | Admit: 2024-08-28 | Discharge: 2024-08-28 | Disposition: A | Discharge status: Home / Self Care | Attending: Emergency Medicine | Admitting: Emergency Medicine

## 2024-08-28 ENCOUNTER — Other Ambulatory Visit: Payer: Self-pay

## 2024-08-28 ENCOUNTER — Ambulatory Visit: Payer: Self-pay

## 2024-08-28 DIAGNOSIS — K625 Hemorrhage of anus and rectum: Secondary | ICD-10-CM | POA: Insufficient documentation

## 2024-08-28 DIAGNOSIS — Z79899 Other long term (current) drug therapy: Secondary | ICD-10-CM | POA: Insufficient documentation

## 2024-08-28 DIAGNOSIS — K623 Rectal prolapse: Secondary | ICD-10-CM | POA: Diagnosis not present

## 2024-08-28 DIAGNOSIS — E871 Hypo-osmolality and hyponatremia: Secondary | ICD-10-CM | POA: Insufficient documentation

## 2024-08-28 DIAGNOSIS — Z91040 Latex allergy status: Secondary | ICD-10-CM | POA: Diagnosis not present

## 2024-08-28 LAB — CBC
HCT: 38.4 % (ref 36.0–46.0)
Hemoglobin: 13.3 g/dL (ref 12.0–15.0)
MCH: 30.6 pg (ref 26.0–34.0)
MCHC: 34.6 g/dL (ref 30.0–36.0)
MCV: 88.5 fL (ref 80.0–100.0)
Platelets: 237 K/uL (ref 150–400)
RBC: 4.34 MIL/uL (ref 3.87–5.11)
RDW: 14.6 % (ref 11.5–15.5)
WBC: 5.4 K/uL (ref 4.0–10.5)
nRBC: 0 % (ref 0.0–0.2)

## 2024-08-28 LAB — COMPREHENSIVE METABOLIC PANEL WITH GFR
ALT: 39 U/L (ref 0–44)
AST: 40 U/L (ref 15–41)
Albumin: 3.9 g/dL (ref 3.5–5.0)
Alkaline Phosphatase: 107 U/L (ref 38–126)
Anion gap: 10 (ref 5–15)
BUN: 14 mg/dL (ref 6–20)
CO2: 22 mmol/L (ref 22–32)
Calcium: 9.9 mg/dL (ref 8.9–10.3)
Chloride: 97 mmol/L — ABNORMAL LOW (ref 98–111)
Creatinine, Ser: 0.76 mg/dL (ref 0.44–1.00)
GFR, Estimated: >60 mL/min (ref >60)
Glucose, Bld: 95 mg/dL (ref 70–99)
Potassium: 4 mmol/L (ref 3.5–5.1)
Sodium: 128 mmol/L — ABNORMAL LOW (ref 135–145)
Total Bilirubin: 0.6 mg/dL (ref 0.0–1.2)
Total Protein: 7.1 g/dL (ref 6.5–8.1)

## 2024-08-28 LAB — PROTIME-INR
INR: 1 (ref 0.8–1.2)
Prothrombin Time: 13.3 s (ref 11.4–15.2)

## 2024-08-28 LAB — OCCULT BLOOD X 1 CARD TO LAB, STOOL: Fecal Occult Bld: POSITIVE — AB

## 2024-08-28 LAB — APTT: aPTT: 28 s (ref 24–36)

## 2024-08-28 MED ORDER — MESALAMINE 1000 MG RE SUPP: 1000.0000 mg | Freq: Two times a day (BID) | RECTAL | 0 refills | Status: AC

## 2024-08-28 MED ORDER — SODIUM CHLORIDE 0.9 % IV BOLUS
1000.0000 mL | Freq: Once | INTRAVENOUS | Status: AC
  Administered 2024-08-28: 1000 mL via INTRAVENOUS

## 2024-08-28 MED ORDER — MORPHINE SULFATE (PF) 4 MG/ML IV SOLN
4.0000 mg | Freq: Once | INTRAVENOUS | Status: DC
Start: 2024-08-28 — End: 2024-08-28

## 2024-08-28 MED ORDER — ACETAMINOPHEN 325 MG PO TABS
650.0000 mg | ORAL_TABLET | Freq: Once | ORAL | Status: AC
  Administered 2024-08-28: 650 mg via ORAL
  Filled 2024-08-28: qty 2

## 2024-08-28 NOTE — Discharge Instructions (Addendum)
 Please take the mesalamine suppository twice daily, continue taking Dulcolax, drinking plenty of electrolyte containing fluids such as Pedialyte, Gatorade to help with loose bowel movements, the GI doctor will call to schedule a close follow-up appointment.  They can discuss further treatment options for the rectal prolapse, as we discussed sometimes you do need a surgical repair but normally the first-line is pelvic floor strengthening exercises and stool softening.  Do your best and try to avoid straining when having bowel movements.  Please return if you have a repeat rectal prolapse and you are not able to reduce it (push it back in) on your own.  Recommend following up with your primary care doctor in 2 to 4 weeks to recheck your sodium, please return if you have significant lightheadedness, dizziness, feeling like you are going to pass out.

## 2024-08-28 NOTE — ED Notes (Signed)
 Will discharge pt when fluids are done. Pt having to go to bathroom frequently.

## 2024-08-28 NOTE — ED Provider Notes (Addendum)
 [place] [facility]MENT AT [facility] Provider Note   CSN: [id number] Arrival date & time: 08/28/24  9178     Patient presents with: Rectal Bleeding   Casey Diaz is a 58 y.o. female with past medical history significant for depression, congenital cerebral palsy , who presents with bright red rectal bleeding intermittently since last Tuesday after colonoscopy and endoscopy.  Patient reports that she was having a colonoscopy to discuss possible hemorrhoid removal.  Had the most bleeding right after the procedure but has continued to have bleeding with bowel movements.  She denies significant pain.  She endorses some left lower back pain which is consistent with her history of sciatica, but she reports has been exacerbated by the bowel prep she performed.  No bowel movement this morning, but small bowel movement last night did have some bright red blood.  She denies feeling dizzy, lightheaded.    Rectal Bleeding      Prior to Admission medications   Medication Sig Start Date End Date Taking? Authorizing Provider  mesalamine (CANASA) 1000 MG suppository Place 1 suppository (1,000 mg total) rectally 2 (two) times daily. 08/28/24  Yes Ambers Iyengar H, PA-C  Abaloparatide (TYMLOS) 3120 MCG/1.56ML SOPN Inject into the skin.    [provider]  AUVELITY  45-105 MG TBCR TAKE 1 TABLET BY MOUTH IN THE MORNING AND IN THE EVENING 05/30/24   Cottle, Lorene KANDICE Raddle., MD  Azelastine-Fluticasone  137-50 MCG/ACT SUSP Place 1-2 sprays into both nostrils daily.    [provider]  baclofen  (LIORESAL ) 10 MG tablet Take 20 mg by mouth at bedtime as needed for muscle spasms. 04/15/15   [provider]  buPROPion  (WELLBUTRIN  XL) 150 MG 24 hr tablet TAKE 1 TABLET (150 MG TOTAL) BY MOUTH IN THE MORNING 06/30/24   Cottle, Lorene KANDICE Raddle., MD  clomiPRAMINE  (ANAFRANIL ) 25 MG capsule TAKE 1 CAPSULE BY MOUTH AT BEDTIME. 08/25/24   Cottle, Lorene KANDICE Raddle., MD  dicyclomine (BENTYL)  10 MG capsule Take 10 mg by mouth daily.    [provider]  docusate sodium  (COLACE) 100 MG capsule Take 1 capsule (100 mg total) by mouth 2 (two) times daily. Patient taking differently: Take 100 mg by mouth daily. 07/09/16   Ernie Cough, MD  Esketamine HCl, 84 MG Dose, (SPRAVATO , 84 MG DOSE,) 28 MG/DEVICE SOPK USE 3 SPRAYS IN EACH NOSTRIL ONCE A WEEK Patient not taking: Reported on 08/08/2024 02/06/24   Geoffry Lorene KANDICE Raddle., MD  fexofenadine (ALLEGRA) 180 MG tablet Take 180 mg by mouth daily.    [provider]  fluvoxaMINE  (LUVOX ) 100 MG tablet TAKE 1 TABLET IN THE AM AND 3 TABLETS AT NIGHT 08/26/24   Cottle, Lorene KANDICE Raddle., MD  hydrocortisone  (ANUSOL -HC) 2.5 % rectal cream Place rectally 2 (two) times daily. x 7-14 days 08/08/12   Perri Bjork, PA-C  ketotifen  (ZADITOR ) 0.025 % ophthalmic solution Place 3 drops into both eyes at bedtime.    [provider]  LORazepam  (ATIVAN ) 1 MG tablet TAKE 1-2 TABS EVERY MORNING,1 TAB IN THE AFTERNOON, & 1-2 TAB AT BEDTIME IF NEEDED FOR ANXIETY/SLEEP 08/18/24   Cottle, Lorene KANDICE Raddle., MD  magnesium gluconate (MAGONATE) 500 MG tablet Take 500 mg by mouth daily.    [provider]  meloxicam  (MOBIC ) 15 MG tablet Take 1 tablet (15 mg total) by mouth daily. 11/21/23   Tobie Franky SQUIBB, DPM  meloxicam  (MOBIC ) 15 MG tablet TAKE 1 TABLET (15 MG TOTAL) BY MOUTH DAILY. 04/15/24  Tobie Franky SQUIBB, DPM  methylPREDNISolone  (MEDROL  DOSEPAK) 4 MG TBPK tablet Take as directed Patient not taking: Reported on 08/08/2024 12/06/23   Tobie Franky SQUIBB, DPM  MIBELAS 24 FE 1-20 MG-MCG(24) CHEW Chew 1 tablet by mouth at bedtime as needed (bowel regularity). 07/01/16   [provider]  Multiple Vitamins-Minerals (ADULT GUMMY PO) Take 2 tablets by mouth in the morning.    [provider]  nitrofurantoin (MACRODANTIN) 100 MG capsule Take 100 mg by mouth as needed (For urinary tract infection.).  04/08/15   [provider]   oxyCODONE -acetaminophen  (PERCOCET/ROXICET) 5-325 MG tablet Take 1-2 tablets by mouth every 6 (six) hours as needed for severe pain. Patient not taking: Reported on 08/08/2024 06/11/21   [name] Eck, PA-C  polyethylene glycol (MIRALAX  / GLYCOLAX ) packet Take 17 g by mouth daily as needed for mild constipation. 07/09/16   Ernie Cough, MD  propranolol  ER (INDERAL  LA) 60 MG 24 hr capsule TAKE 1 CAPSULE BY MOUTH EVERY DAY 01/27/24   Cottle, Carey G Jr., MD  psyllium (METAMUCIL) 58.6 % powder Take 1 packet by mouth daily as needed (constipation).    [provider]  SUMAtriptan  (IMITREX ) 100 MG tablet TAKE 1 TAB EVERY 2 HOURS AS NEEDED FOR MIGRAINE. MAY REPEAT IN 2 HRS IF HEADACHE PERSISTS OR RECURS 06/03/24   Cottle, Lorene KANDICE Raddle., MD  temazepam  (RESTORIL ) 30 MG capsule Take 1 capsule by mouth at bedtime as needed for sleep 08/06/24   Cottle, Lorene KANDICE Raddle., MD  Vitamin D-Vitamin K (VITAMIN K2-VITAMIN D3 PO) Take 1-2 sprays by mouth daily.    [provider]    Allergies: Hydrocodone , Sulfamethoxazole-trimethoprim, Dust mite extract, Latex, Other, and Pollen extract    Review of Systems  Gastrointestinal:  Positive for hematochezia.  All other systems reviewed and are negative.   Updated Vital Signs BP 114/83   Pulse 68   Temp 97.8 F (36.6 C) (Oral)   Resp 18   LMP  (LMP Unknown)   SpO2 97%   Physical Exam Vitals and nursing note reviewed.  Constitutional:      General: She is not in acute distress.    Appearance: Normal appearance.  HENT:     Head: Normocephalic and atraumatic.  Eyes:     General:        Right eye: No discharge.        Left eye: No discharge.  Cardiovascular:     Rate and Rhythm: Normal rate and regular rhythm.     Heart sounds: No murmur heard.    No friction rub. No gallop.  Pulmonary:     Effort: Pulmonary effort is normal.     Breath sounds: Normal breath sounds.  Abdominal:     General: Bowel sounds are normal.     Palpations: Abdomen is  soft.  Genitourinary:    Comments: Normal appearance of external rectum, no active bleeding, no thrombosed hemorrhoids internally or externally, rectal vault is clear with no evidence of bleeding. Skin:    General: Skin is warm and dry.     Capillary Refill: Capillary refill takes less than 2 seconds.  Neurological:     Mental Status: She is alert and oriented to person, place, and time.  Psychiatric:        Mood and Affect: Mood normal.        Behavior: Behavior normal.     (all labs ordered are listed, but only abnormal results are displayed) Labs Reviewed  OCCULT BLOOD X 1  CARD TO LAB, STOOL - Abnormal; Notable for the following components:      Result Value   Fecal Occult Bld POSITIVE (*)    All other components within normal limits  COMPREHENSIVE METABOLIC PANEL WITH GFR - Abnormal; Notable for the following components:   Sodium 128 (*)    Chloride 97 (*)    All other components within normal limits  CBC  PROTIME-INR  APTT    EKG: None  Radiology: No results found.   Procedures   Medications Ordered in the ED  acetaminophen  (TYLENOL ) tablet 650 mg (650 mg Oral Given 08/28/24 0923)  sodium chloride  0.9 % bolus 1,000 mL (1,000 mLs Intravenous New Bag/Given 08/28/24 1023)                                    Medical Decision Making Amount and/or Complexity of Data Reviewed Labs: ordered.  Risk OTC drugs. Prescription drug management.   This patient is a 58 y.o. female  who presents to the ED for concern of rectal bleeding.   Differential diagnoses prior to evaluation: The emergent differential diagnosis includes, but is not limited to, bleeding polyp, hemorrhoid,. This is not an exhaustive differential.   Past Medical History / Co-morbidities / Social History: Depression, OCD, cerebral palsy  Additional history: Chart reviewed. Pertinent results include: Previous surgical colposcopy remotely, reviewed GI notes regarding this visit prior to  arrival.  Physical Exam: Physical exam performed. The pertinent findings include: Patient with nonthrombosed hemorrhoid, no active bleeding on my exam, after having a bowel movement emergency department patient did develop some slow bright red bleeding.  Lab Tests/Imaging studies: I personally interpreted labs/imaging and the pertinent results include: CBC unremarkable, stable hemoglobin, normal PT/INR, APTT.  Her Hemoccult is positive.  Her CMP is notable for hyponatremia, sodium 128, suspect fluid dilution secondary to recent bowel prep and increase fluid intake per patient.   Medications: I ordered medication including fluid bolus for hyponatremia, Tylenol  for pain.  I have reviewed the patients home medicines and have made adjustments as needed.  Consults: I spoke with [name], Dr. Saintclair who recommended suppository mesalamine twice daily, and [facility] will call patient to schedule close follow-up.  Given her vital signs are stable, her hemoglobin is stable do not feel that she needs any additional treatment here in the emergency department, she can follow-up closely with GI doctor as planned.   Disposition: After consideration of the diagnostic results and the patients response to treatment, I feel that rectal bleeding status post colonoscopy 1 week ago with plan for follow-up with GI.   emergency department workup does not suggest an emergent condition requiring admission or immediate intervention beyond what has been performed at this time. The plan is: as above. The patient is safe for discharge and has been instructed to return immediately for worsening symptoms, change in symptoms or any other concerns.   Prior to discharge patient attempted to use the bathroom and had a spontaneous rectal prolapse.  I returned to her room in an attempt to reduce her prolapsed rectum and it had reduced spontaneously.  I reassured patient that the issue is resolved from an emergent perspective as long as it  is able to spontaneously reduce, stressed the importance of stool softeners, pelvic floor strengthening exercises and close GI follow-up.  She had some feeling of urinary urgency associated with the rectal prolapse, bladder scanned with only 20 mL,  suspect that her feeling of urgency is related to her prolapse.  Final diagnoses:  Rectal bleeding  Hyponatremia  Rectal prolapse    ED Discharge Orders          Ordered    mesalamine (CANASA) 1000 MG suppository  2 times daily        08/28/24 1039               Sonali Wivell, [place] H, PA-C 08/28/24 1101    Rosan Sherlean DEL, PA-C 08/28/24 1245    Lenor Hollering, MD 08/28/24 1319

## 2024-08-28 NOTE — Telephone Encounter (Signed)
  FYI Only or Action Required?: Action required by provider: update on patient condition.  Patient was last seen in primary care on non-cone primary.  Called Nurse Triage reporting prolapsed rectum.  Symptoms began today.  Interventions attempted: Rest, hydration, or home remedies.  Symptoms are: completely resolved.  Triage Disposition: Go to ED Now (Notify PCP)- just treated at ED, will manage at home  Patient/caregiver understands and will follow disposition?:  Copied from CRM [id number]. Topic: Clinical - Red Word Triage >> Aug 28, 2024  2:17 PM Everette C wrote: [facility] that prompted transfer to Nurse Triage: The patient has recently been seen in the hospital for rectal concerns and shares that they had an issue with defecation when they arrived home today Reason for Disposition  Large mass protruding out of rectum  Answer Assessment - Initial Assessment Questions Patient was at ED today for rectal prolapse On arrival home, she defecated on herself and prolapsed again. Rectum spontaneously reduced.   Per ED note, if rectum prolapses and spontaneously reduces, then it is ok to manage at home.   Patient is crying on phone and is very upset and scared.   Patient scheduled to f/u with GI clinic tomorrow.  She will call GI clinic today to ensure that they have all relevant medical records and to find out what to expect from tomorrow's appointment.   She will return to ED for any worsening symptoms  Protocols used: Rectal Symptoms-A-AH

## 2024-08-28 NOTE — ED Triage Notes (Signed)
 Reports colonoscopy and endoscopy last Tuesday. Reports bright red rectal bleeding. Hx of hemorrhoids.

## 2024-08-28 NOTE — ED Notes (Signed)
 Pt went to the bathroom and felt like her  hemorrhoid had popped out, upon further examination by the PA it was her rectum that prolapsed. When she got back to the room the rectum had placed itself back in and no other intervention was necessary. Pt finishing up fluids now.

## 2024-09-03 ENCOUNTER — Other Ambulatory Visit: Payer: Self-pay

## 2024-09-03 ENCOUNTER — Telehealth: Payer: Self-pay | Admitting: Psychiatry

## 2024-09-03 DIAGNOSIS — F5105 Insomnia due to other mental disorder: Secondary | ICD-10-CM

## 2024-09-03 MED ORDER — TEMAZEPAM 30 MG PO CAPS: ORAL_CAPSULE | ORAL | 1 refills | Status: DC

## 2024-09-03 NOTE — Telephone Encounter (Signed)
 Pended

## 2024-09-03 NOTE — Telephone Encounter (Signed)
 Pt needs rf of Temazepam      [facility] [address]

## 2024-09-06 NOTE — Therapy (Signed)
 OUTPATIENT PHYSICAL THERAPY NEURO EVALUATION   Patient Name: Casey Diaz MRN: [id number] DOB:[birth year], 58 y.o., female Today's Date: 09/09/2024   PCP: Onita Rush, MD  REFERRING PROVIDER: Nick Suzen Bucker, DO   END OF SESSION:  PT End of Session - 09/09/24 1148     Visit Number 1    Number of Visits 13    Date for Recertification  10/28/24    Authorization Type [employer]    Authorization - Visit Number 25   combined PT/OT/ST   Authorization - Number of Visits 100    PT Start Time 1112   pt late   PT Stop Time 1144    PT Time Calculation (min) 32 min    Activity Tolerance Patient tolerated treatment well    Behavior During Therapy Anxious   pt became emotional during session         Past Medical History:  Diagnosis Date   Abnormal Pap smear 2011   hpv/mild dysplasia,cin1   Anxiety    Cerebral palsy (HCC)    right arm/leg   Cystocele    Depression    Headache    Neuromuscular disorder (HCC)    Cerebral Palsy   OCD (obsessive compulsive disorder)    Osteoporosis    Uterine prolaps    Past Surgical History:  Procedure Laterality Date   COLPOSCOPY  2011   ELBOW SURGERY     left elbow-separation of bones -age 28   ELBOW SURGERY  2015   fell on ice- right elbow fracture   FRACTURE SURGERY     HAMMER TOE SURGERY  1/13   RIGHT SIDE   HIP PINNING,CANNULATED Left 07/08/2016   Procedure: LEFT CLOSED REDUCTION HIP AND PERCUTANEOUS SCREW;  Surgeon: Donnice Car, MD;  Location: [facility];  Service: Orthopedics;  Laterality: Left;   ORIF HUMERUS FRACTURE Right 06/11/2021   Procedure: OPEN REDUCTION INTERNAL FIXATION (ORIF) DISTAL HUMERUS FRACTURE;  Surgeon: Celena Sharper, MD;  Location: [facility] OR;  Service: Orthopedics;  Laterality: Right;   WRIST SURGERY  2005   left wrist   Patient Active Problem List   Diagnosis Date Noted   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HCC) 08/03/2018   Relationship problem with family member  08/03/2018   Closed fracture of neck of left femur, initial encounter (HCC) 07/08/2016   Severe recurrent major depression without psychotic features (HCC) 06/03/2015    Class: Chronic   Obsessive-compulsive disorder 06/03/2015    Class: Chronic    ONSET DATE: congenital   REFERRING DIAG: 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 Asking for assistance in donning a knee brace back on and asking if there are any urgent Ortho clinics around. Pt also has questions about CP support groups in the area. Reports knee pain when walking since this fall.. Reports that she usually wears a R AFO but is not wearing it currently d/t this knee pain. Reports chronic LBP and neck pain. Denies N/T or radiation. Patient became emotional about fear of fracturing her R knee.    Pt accompanied by: self  PERTINENT HISTORY: CP, anxiety, depression, HA, CPD, osteoporosis, L elbow and wrist surgery, L hip pinning, R humerus ORIF  PAIN:  Are you having pain? Yes: NPRS scale: 9/10 Pain location: R medial patella Pain description: sharp, dull, achy Aggravating factors: walking Relieving factors: ice  PRECAUTIONS: Fall, osteoporosis  RED FLAGS: None   WEIGHT BEARING RESTRICTIONS: No  FALLS: Has patient fallen in last 6 months? Yes. 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  DIAGNOSTIC FINDINGS: none recent  COGNITION: Overall cognitive status: Within functional limits for tasks assessed   SENSATION: Pt denies changes in N/T  COORDINATION: Alternating pronation/supination: motor limitations on R UE Alternating toe tap: motor limitations on R LE Finger to nose: motor limitations on R UE; mild tremor on L   Observation: no swelling or bruising over R knee   MUSCLE TONE: slightly increased extensor tone in R quad; no knee pain with testing    POSTURE: rounded shoulders; head tilted slightly   LOWER EXTREMITY ROM:     Active  Right Eval Left Eval  Hip flexion    Hip extension    Hip abduction    Hip adduction    Hip internal rotation    Hip external rotation    Knee flexion    Knee extension    Ankle dorsiflexion 3 11  Ankle plantarflexion    Ankle inversion    Ankle eversion     (Blank rows = not tested)  LOWER EXTREMITY MMT:    MMT (in sitting) Right Eval Left Eval  Hip flexion 4 4+  Hip extension    Hip abduction 4- 4+  Hip adduction 4- 4+  Hip internal rotation    Hip external rotation    Knee flexion 4 4  Knee extension 4 5  Ankle dorsiflexion 3+ 5  Ankle plantarflexion 3+ 4+  Ankle inversion    Ankle eversion    (Blank rows = not tested) *no knee pain during testing   GAIT: Findings: Assistive device utilized:None, Level of assistance: Modified independence and SBA, and Comments: R knee adduction, lateral trunk lean, reduced R foot clearance with foot in eversion  FUNCTIONAL TESTS:  5 times sit to  stand: 19.23 sec without UE support; retropulsion and limited descending control  10 meter walk test: 14.15 sec (2.32 ft/sec)  TREATMENT DATE: 09/09/24     PATIENT EDUCATION: Education details: advised pt to make appt with PCP about R knee pain; advised pt that I was not aware of any CP support groups in the area unfortunately; prognosis, POC  Person educated: Patient Education method: Explanation Education comprehension: verbalized understanding  HOME EXERCISE PROGRAM: Not yet initiated   GOALS: Goals reviewed with patient? Yes  SHORT TERM GOALS: Target date: 09/30/2024  Patient to be independent with initial HEP. Baseline: HEP initiated Goal status: INITIAL    LONG TERM GOALS: Target date: 10/28/2024  Patient to be independent with advanced HEP. Baseline: Not yet initiated  Goal status: INITIAL  Patient to recall 3 ways to manage neck and LBP. Baseline: edu not yet initiated Goal status: INITIAL  Patient to demonstrate gait speed of 2.8 ft/sec in order to improve access to community.  Baseline: 2.32 ft/sec Goal status: INITIAL  Patient to score at least 18/24 on DGI in order to decrease risk of falls.  Baseline: NT Goal status: INITIAL  Patient to demonstrate 5xSTS test in <15 sec in order to decrease risk of falls.  Baseline: 19.23 sec without UE support; retropulsion and limited descending control  Goal status: INITIAL  Patient to report understanding of fall prevention education. Baseline: not yet initiated Goal status: INITIAL  ASSESSMENT:  CLINICAL IMPRESSION:  Patient is a 58 y/o F with spastic diplegic CP presenting to OPPT with c/o acute R knee pain s/p fall on 09/05/24 as well as chronic neck and LBP. Patient arrived ambulating without AD and R knee without swelling or bruising. Able to complete exam without complaints.  Patient today presenting with R UE and LE motor limitations, L UE tremor, increased R quad tone, tilting head posture, limited R ankle dorsiflexion AROM, R LE weakness, gait deviations, and imbalance and limited control with transfers. Would benefit from skilled PT services 1-2 x/week for 6 weeks to address aforementioned impairments in order to optimize level of function.    OBJECTIVE IMPAIRMENTS: Abnormal gait, decreased activity tolerance, decreased balance, decreased coordination, decreased knowledge of use of DME, decreased mobility, difficulty walking, decreased ROM, decreased strength, impaired flexibility, impaired tone, postural dysfunction, and pain.   ACTIVITY LIMITATIONS: carrying, lifting, bending, standing, squatting, stairs, transfers, bathing, toileting, dressing, reach over head, hygiene/grooming, locomotion level, and caring for others  PARTICIPATION LIMITATIONS: meal prep, cleaning, laundry, driving, shopping, community activity, yard work, and church  PERSONAL FACTORS: Age, Behavior pattern, Past/current experiences, Time since onset of injury/illness/exacerbation, and 3+ comorbidities: CP, anxiety, depression, HA, CPD, osteoporosis, L elbow and wrist surgery, L hip pinning, R humerus ORIF are also affecting patient's functional outcome.   REHAB POTENTIAL: Good  CLINICAL DECISION MAKING: Evolving/moderate complexity  EVALUATION COMPLEXITY: Moderate  PLAN:  PT FREQUENCY: 1-2x/week  PT DURATION: 6 weeks  PLANNED INTERVENTIONS: 97164- PT Re-evaluation, 97110-Therapeutic exercises, 97530- Therapeutic activity, 97112- Neuromuscular re-education, 97535- Self Care, 02859- Manual therapy, [id number]- Gait training, [phone]- Canalith repositioning, [id number]- Electrical stimulation (manual), [phone] (1-2 muscles), 20561 (3+ muscles)- Dry Needling, Patient/Family education, Balance training, Stair training, Taping, Joint mobilization, Spinal mobilization, Vestibular training, DME instructions,  Cryotherapy, and Moist heat  PLAN FOR NEXT SESSION: palpation of neck and LB, cervical and lumbar AROM, DGI; initiate HEP to work on balance, LE and core strength    Louana Terrilyn Christians, PT, DPT 09/09/24 12:02 PM  [facility] at [facility] [address] [place], [place] [address] Phone # [phone] Fax # [phone]

## 2024-09-09 ENCOUNTER — Other Ambulatory Visit: Payer: Self-pay

## 2024-09-09 ENCOUNTER — Ambulatory Visit: Attending: Physical Medicine and Rehabilitation | Admitting: Physical Therapy

## 2024-09-09 ENCOUNTER — Encounter: Payer: Self-pay | Admitting: Physical Therapy

## 2024-09-09 DIAGNOSIS — R2681 Unsteadiness on feet: Secondary | ICD-10-CM | POA: Insufficient documentation

## 2024-09-09 DIAGNOSIS — M5459 Other low back pain: Secondary | ICD-10-CM | POA: Insufficient documentation

## 2024-09-09 DIAGNOSIS — G801 Spastic diplegic cerebral palsy: Secondary | ICD-10-CM | POA: Diagnosis present

## 2024-09-09 DIAGNOSIS — M542 Cervicalgia: Secondary | ICD-10-CM | POA: Diagnosis present

## 2024-09-09 DIAGNOSIS — R2689 Other abnormalities of gait and mobility: Secondary | ICD-10-CM | POA: Diagnosis present

## 2024-09-11 ENCOUNTER — Ambulatory Visit (INDEPENDENT_AMBULATORY_CARE_PROVIDER_SITE_OTHER): Admitting: Psychiatry

## 2024-09-11 DIAGNOSIS — F4010 Social phobia, unspecified: Secondary | ICD-10-CM

## 2024-09-11 DIAGNOSIS — F422 Mixed obsessional thoughts and acts: Secondary | ICD-10-CM

## 2024-09-11 DIAGNOSIS — Z638 Other specified problems related to primary support group: Secondary | ICD-10-CM

## 2024-09-11 DIAGNOSIS — F339 Major depressive disorder, recurrent, unspecified: Secondary | ICD-10-CM | POA: Diagnosis not present
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
## 2024-09-17 ENCOUNTER — Ambulatory Visit: Admitting: Physical Therapy

## 2024-09-18 NOTE — Telephone Encounter (Signed)
 Pt left voicemail about having a referral to neuro physical therapy at [facility] in [place]. Pt fell last week and hurt knee badly. Pt wants to alter referral or new referral to work on their knee.

## 2024-09-20 ENCOUNTER — Ambulatory Visit: Admitting: Physical Therapy

## 2024-09-25 ENCOUNTER — Ambulatory Visit: Admitting: Psychiatry

## 2024-09-25 NOTE — Therapy (Incomplete)
OUTPATIENT PHYSICAL THERAPY NEURO TREATMENT   Patient Name: Casey Diaz MRN: [id number] DOB:09-Dec-1965, 58 y.o., female Today's Date: 09/25/2024   PCP: Onita Rush, MD  REFERRING PROVIDER: Nick Suzen Bucker, DO   END OF SESSION:    Past Medical History:  Diagnosis Date   Abnormal Pap smear 2011   hpv/mild dysplasia,cin1   Anxiety    Cerebral palsy (HCC)    right arm/leg   Cystocele    Depression    Headache    Neuromuscular disorder (HCC)    Cerebral Palsy   OCD (obsessive compulsive disorder)    Osteoporosis    Uterine prolaps    Past Surgical History:  Procedure Laterality Date   COLPOSCOPY  2011   ELBOW SURGERY     left elbow-separation of bones -age 25   ELBOW SURGERY  2015   fell on ice- right elbow fracture   FRACTURE SURGERY     HAMMER TOE SURGERY  1/13   RIGHT SIDE   HIP PINNING,CANNULATED Left 07/08/2016   Procedure: LEFT CLOSED REDUCTION HIP AND PERCUTANEOUS SCREW;  Surgeon: Donnice Car, MD;  Location: [facility];  Service: Orthopedics;  Laterality: Left;   ORIF HUMERUS FRACTURE Right 06/11/2021   Procedure: OPEN REDUCTION INTERNAL FIXATION (ORIF) DISTAL HUMERUS FRACTURE;  Surgeon: Celena Sharper, MD;  Location: [facility] OR;  Service: Orthopedics;  Laterality: Right;   WRIST SURGERY  2005   left wrist   Patient Active Problem List   Diagnosis Date Noted   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HCC) 08/03/2018   Relationship problem with family member 08/03/2018   Closed fracture of neck of left femur, initial encounter (HCC) 07/08/2016   Severe recurrent major depression without psychotic features (HCC) 06/03/2015    Class: Chronic   Obsessive-compulsive disorder 06/03/2015    Class: Chronic    ONSET DATE: congenital   REFERRING DIAG: 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  Rationale for Evaluation and Treatment: Rehabilitation  SUBJECTIVE:                                                                                                                                                                                             SUBJECTIVE STATEMENT: Pt reports that she had a fall on Thursday and hurt her R knee. Asking for assistance in donning a knee brace back on and asking if there are any urgent Ortho clinics around. Pt also has questions about CP support groups in  the area. Reports knee pain when walking since this fall.. Reports that she usually wears a R AFO but is not wearing it currently d/t this knee pain. Reports chronic LBP and neck pain. Denies N/T or radiation. Patient became emotional about fear of fracturing her R knee.    Pt accompanied by: self  PERTINENT HISTORY: CP, anxiety, depression, HA, CPD, osteoporosis, L elbow and wrist surgery, L hip pinning, R humerus ORIF  PAIN:  Are you having pain? Yes: NPRS scale: 9/10 Pain location: R medial patella Pain description: sharp, dull, achy Aggravating factors: walking Relieving factors: ice  PRECAUTIONS: Fall, osteoporosis  RED FLAGS: None   WEIGHT BEARING RESTRICTIONS: No  FALLS: Has patient fallen in last 6 months? Yes. 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  DIAGNOSTIC FINDINGS: none recent  COGNITION: Overall cognitive status:  Within functional limits for tasks assessed   SENSATION: Pt denies changes in N/T  COORDINATION: Alternating pronation/supination: motor limitations on R UE Alternating toe tap: motor limitations on R LE Finger to nose: motor limitations on R UE; mild tremor on L   Observation: no swelling or bruising over R knee   MUSCLE TONE: slightly increased extensor tone in R quad; no knee pain with testing    POSTURE: rounded shoulders; head tilted slightly   LOWER EXTREMITY ROM:     Active  Right Eval Left Eval  Hip flexion    Hip extension    Hip abduction    Hip adduction    Hip internal rotation    Hip external rotation    Knee flexion    Knee extension    Ankle dorsiflexion 3 11  Ankle plantarflexion    Ankle inversion    Ankle eversion     (Blank rows = not tested)  LOWER EXTREMITY MMT:    MMT (in sitting) Right Eval Left Eval  Hip flexion 4 4+  Hip extension    Hip abduction 4- 4+  Hip adduction 4- 4+  Hip internal rotation    Hip external rotation    Knee flexion 4 4  Knee extension 4 5  Ankle dorsiflexion 3+ 5  Ankle plantarflexion 3+ 4+  Ankle inversion    Ankle eversion    (Blank rows = not tested) *no knee pain during testing   GAIT: Findings: Assistive device utilized:None, Level of assistance: Modified independence and SBA, and Comments: R knee adduction, lateral trunk lean, reduced R foot clearance with foot in eversion  FUNCTIONAL TESTS:  5 times sit to stand: 19.23 sec without UE support; retropulsion and limited descending control  10 meter walk test: 14.15 sec (2.32 ft/sec)  TREATMENT DATE: 09/09/24     PATIENT EDUCATION: Education details: advised pt to make appt with PCP about R knee pain; advised pt that I was not aware of any CP support groups in the area unfortunately; prognosis, POC  Person educated:  Patient Education method: Explanation Education comprehension: verbalized understanding  HOME EXERCISE PROGRAM: Not yet initiated   GOALS: Goals reviewed with patient? Yes  SHORT TERM GOALS: Target date: 09/30/2024  Patient to be independent with initial HEP. Baseline: HEP initiated Goal status: INITIAL    LONG TERM GOALS: Target date: 10/28/2024  Patient to be independent with advanced HEP. Baseline: Not yet initiated  Goal status: INITIAL  Patient to recall 3 ways to manage neck and LBP. Baseline: edu not yet initiated Goal status: IN PROGRESS  Patient to demonstrate gait speed of 2.8 ft/sec in order to improve access to community.  Baseline: 2.32 ft/sec Goal status: IN PROGRESS  Patient to score at least 18/24 on DGI in order to decrease risk of falls.  Baseline: NT Goal status: IN PROGRESS  Patient to demonstrate 5xSTS test in <15 sec in order to decrease risk of falls.  Baseline: 19.23 sec without UE support; retropulsion and limited descending control  Goal status: IN PROGRESS  Patient to report understanding of fall prevention education. Baseline: not yet initiated Goal status: IN PROGRESS  ASSESSMENT:  CLINICAL IMPRESSION:  Patient is a 58 y/o F with spastic diplegic CP presenting to OPPT with c/o acute R knee pain s/p fall on 09/05/24 as well as chronic neck and LBP. Patient arrived ambulating without AD and R knee without swelling or bruising. Able to complete exam without complaints. Patient today presenting with R UE and LE motor limitations, L UE tremor, increased R quad tone, tilting head posture, limited R ankle dorsiflexion AROM, R LE weakness, gait deviations, and imbalance and limited control with transfers. Would benefit from skilled PT services 1-2 x/week for 6 weeks to address aforementioned impairments in order to optimize level of function.    OBJECTIVE IMPAIRMENTS: Abnormal gait, decreased activity tolerance, decreased balance, decreased  coordination, decreased knowledge of use of DME, decreased mobility, difficulty walking, decreased ROM, decreased strength, impaired flexibility, impaired tone, postural dysfunction, and pain.   ACTIVITY LIMITATIONS: carrying, lifting, bending, standing, squatting, stairs, transfers, bathing, toileting, dressing, reach over head, hygiene/grooming, locomotion level, and caring for others  PARTICIPATION LIMITATIONS: meal prep, cleaning, laundry, driving, shopping, community activity, yard work, and church  PERSONAL FACTORS: Age, Behavior pattern, Past/current experiences, Time since onset of injury/illness/exacerbation, and 3+ comorbidities: CP, anxiety, depression, HA, CPD, osteoporosis, L elbow and wrist surgery, L hip pinning, R humerus ORIF are also affecting patient's functional outcome.   REHAB POTENTIAL: Good  CLINICAL DECISION MAKING: Evolving/moderate complexity  EVALUATION COMPLEXITY: Moderate  PLAN:  PT FREQUENCY: 1-2x/week  PT DURATION: 6 weeks  PLANNED INTERVENTIONS: 97164- PT Re-evaluation, 97110-Therapeutic exercises, 97530- Therapeutic activity, 97112- Neuromuscular re-education, 97535- Self Care, 02859- Manual therapy, [phone]- Gait training, [phone]- Canalith repositioning, [id number]- Electrical stimulation (manual), [phone] (1-2 muscles), [id number] (3+ muscles)- Dry Needling, Patient/Family education, Balance training, Stair training, Taping, Joint mobilization, Spinal mobilization, Vestibular training, DME instructions, Cryotherapy, and Moist heat  PLAN FOR NEXT SESSION: palpation of neck and LB, cervical and lumbar AROM, DGI; initiate HEP to work on balance, LE and core strength    Louana Terrilyn Christians, PT, DPT 09/25/24 9:33 AM  [facility] at [facility] [address] [place], [place] [address] Phone # [phone] Fax # [phone] ° ° ° ° ° ° ° ° °

## 2024-09-26 ENCOUNTER — Ambulatory Visit: Admitting: Physical Therapy

## 2024-10-02 NOTE — Therapy (Signed)
 OUTPATIENT PHYSICAL THERAPY NEURO TREATMENT   Patient Name: Casey Diaz MRN: [id number] DOB:09-25-1966, 58 y.o., female Today's Date: 10/03/2024   PCP: Onita Rush, MD  REFERRING PROVIDER: Nick Suzen Bucker, DO   END OF SESSION:  PT End of Session - 10/03/24 1229     Visit Number 2    Number of Visits 13    Date for Recertification  10/28/24    Authorization Type Aetna    Authorization - Visit Number 26    Authorization - Number of Visits 100    PT Start Time 1201   pt late   PT Stop Time 1230    PT Time Calculation (min) 29 min    Activity Tolerance Patient tolerated treatment well    Behavior During Therapy [facility] for tasks assessed/performed   pt became emotional during session          Past Medical History:  Diagnosis Date   Abnormal Pap smear 2011   hpv/mild dysplasia,cin1   Anxiety    Cerebral palsy (HCC)    right arm/leg   Cystocele    Depression    Headache    Neuromuscular disorder (HCC)    Cerebral Palsy   OCD (obsessive compulsive disorder)    Osteoporosis    Uterine prolaps    Past Surgical History:  Procedure Laterality Date   COLPOSCOPY  2011   ELBOW SURGERY     left elbow-separation of bones -age 89   ELBOW SURGERY  2015   fell on ice- right elbow fracture   FRACTURE SURGERY     HAMMER TOE SURGERY  1/13   RIGHT SIDE   HIP PINNING,CANNULATED Left 07/08/2016   Procedure: LEFT CLOSED REDUCTION HIP AND PERCUTANEOUS SCREW;  Surgeon: Donnice Car, MD;  Location: [facility];  Service: Orthopedics;  Laterality: Left;   ORIF HUMERUS FRACTURE Right 06/11/2021   Procedure: OPEN REDUCTION INTERNAL FIXATION (ORIF) DISTAL HUMERUS FRACTURE;  Surgeon: Celena Sharper, MD;  Location: [facility];  Service: Orthopedics;  Laterality: Right;   WRIST SURGERY  2005   left wrist   Patient Active Problem List   Diagnosis Date Noted   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HCC) 08/03/2018   Relationship problem with family member  08/03/2018   Closed fracture of neck of left femur, initial encounter (HCC) 07/08/2016   Severe recurrent major depression without psychotic features (HCC) 06/03/2015    Class: Chronic   Obsessive-compulsive disorder 06/03/2015    Class: Chronic    ONSET DATE: congenital   REFERRING DIAG: G80.1 (ICD-10-CM) - Spastic diplegic cerebral palsy M54.2 (ICD-10-CM) - Cervicalgia R29.3 (ICD-10-CM) - Abnormal posture Z87.81 (ICD-10-CM) - Personal history of (healed) traumatic fracture  THERAPY DIAG:  Spastic diplegic cerebral palsy (HCC)  Unsteadiness on feet  Other abnormalities of gait and mobility  Cervicalgia  Other low back pain  Rationale for Evaluation and Treatment: Rehabilitation  SUBJECTIVE:  SUBJECTIVE STATEMENT: Apologizes for being late- having some ongoing stomach issues. The R knee is feeling better- has been using a TENS unit and that helped a lot. Reports that she went to urgent care who advised her no broken bones.   Pt accompanied by: self  PERTINENT HISTORY: CP, anxiety, depression, HA, CPD, osteoporosis, L elbow and wrist surgery, L hip pinning, R humerus ORIF  PAIN:  Are you having pain? Yes: NPRS scale: 6/10 Pain location: neck and back  Pain description: dull Aggravating factors: prolonged sitting or standing Relieving factors: TENS, chiropractor   PRECAUTIONS: Fall, osteoporosis  RED FLAGS: None   WEIGHT BEARING RESTRICTIONS: No  FALLS: Has patient fallen in last 6 months? Yes. 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, and  Leisure: personal training 2x/wk  PATIENT GOALS: address LB and neck pain   OBJECTIVE:     TODAY'S TREATMENT: 10/03/24 Activity Comments  Palpation  Soft tissue restriction and TTP in B UT, LS, cervical paraspinals, suboccipitals   Soft tissue restriction and TTP in B lumbar paraspinals and L>R piriformis and glutes   LTR  Cues to move without straining  Hooklying pelvic tilts  Manual cues for proper movement pattern   Open book stretch  To tolerance   Cervical retraction  With gentle OP; good form and tolerance                 CERVICAL ROM:   Active ROM AROM (deg) 10/03/24  Flexion 30  Extension 76 *excessive reliance on capital extension   Right lateral flexion 50  Left lateral flexion 46  Right rotation 57  Left rotation 75   (Blank rows = not tested)  LUMBAR ROM:   Active  AROM  10/03/24  Flexion Nearly to toes  Extension 50% limited  Right lateral flexion 25% limited  Left lateral flexion 25% limited   Right rotation 75% limited  Left rotation 50% limited   (Blank rows = not tested)   PATIENT EDUCATION: Education details: edu on exam findings, HEP- to perform to tolerance  Person educated: Patient Education method: Explanation, Demonstration, Tactile cues, Verbal cues, and Handouts Education comprehension: verbalized understanding and returned demonstration   HOME EXERCISE PROGRAM Last updated: 10/03/24 Access Code: [email or web address] URL: [email or web address] Date: 10/03/2024 Prepared by: [facility] - Outpatient  Rehab - [facility]  Exercises - Supine Lower Trunk Rotation  - 1 x daily - 5 x weekly - 2 sets - 10 reps - Supine Pelvic Tilt  - 1 x daily - 5 x weekly - 2 sets - 10 reps - Sidelying Thoracic Rotation with Open Book  - 1 x daily - 5 x weekly - 2 sets - 10 reps - Cervical Retraction with Overpressure  - 1 x daily - 5 x weekly - 2 sets - 10 reps - 3 sec hold   Note: Objective measures were completed at Evaluation unless  otherwise noted.  DIAGNOSTIC FINDINGS: none recent  COGNITION: Overall cognitive status: Within functional limits for tasks assessed   SENSATION: Pt denies changes in N/T  COORDINATION: Alternating pronation/supination: motor limitations on R UE Alternating toe tap: motor limitations on R LE Finger to nose: motor limitations on R UE; mild tremor on L   Observation: no swelling or bruising over R knee  MUSCLE TONE: slightly increased extensor tone in R quad; no knee pain with testing    POSTURE: rounded shoulders; head tilted slightly   LOWER EXTREMITY ROM:     Active  Right Eval Left Eval  Hip flexion    Hip extension    Hip abduction    Hip adduction    Hip internal rotation    Hip external rotation    Knee flexion    Knee extension    Ankle dorsiflexion 3 11  Ankle plantarflexion    Ankle inversion    Ankle eversion     (Blank rows = not tested)  LOWER EXTREMITY MMT:    MMT (in sitting) Right Eval Left Eval  Hip flexion 4 4+  Hip extension    Hip abduction 4- 4+  Hip adduction 4- 4+  Hip internal rotation    Hip external rotation    Knee flexion 4 4  Knee extension 4 5  Ankle dorsiflexion 3+ 5  Ankle plantarflexion 3+ 4+  Ankle inversion    Ankle eversion    (Blank rows = not tested) *no knee pain during testing   GAIT: Findings: Assistive device utilized:None, Level of assistance: Modified independence and SBA, and Comments: R knee adduction, lateral trunk lean, reduced R foot clearance with foot in eversion  FUNCTIONAL TESTS:  5 times sit to stand: 19.23 sec without UE support; retropulsion and limited descending control  10 meter walk test: 14.15 sec (2.32 ft/sec)                                                                                                                                TREATMENT DATE: 09/09/24     PATIENT EDUCATION: Education details: advised pt to make appt with PCP about R knee pain; advised pt that I was not aware  of any CP support groups in the area unfortunately; prognosis, POC  Person educated: Patient Education method: Explanation Education comprehension: verbalized understanding  HOME EXERCISE PROGRAM: Not yet initiated   GOALS: Goals reviewed with patient? Yes  SHORT TERM GOALS: Target date: 09/30/2024  Patient to be independent with initial HEP. Baseline: HEP initiated Goal status: INITIAL    LONG TERM GOALS: Target date: 10/28/2024  Patient to be independent with advanced HEP. Baseline: Not yet initiated  Goal status: INITIAL  Patient to recall 3 ways to manage neck and LBP. Baseline: edu not yet initiated Goal status: IN PROGRESS  Patient to demonstrate gait speed of 2.8 ft/sec in order to improve access to community.  Baseline: 2.32 ft/sec Goal status: IN PROGRESS  Patient to score at least 18/24 on DGI in order to decrease risk of falls.  Baseline: NT Goal status: IN PROGRESS  Patient to demonstrate 5xSTS test in <15 sec in order to decrease risk of falls.  Baseline: 19.23 sec without UE support; retropulsion and limited descending control  Goal status: IN PROGRESS  Patient to report understanding of fall prevention  education. Baseline: not yet initiated Goal status: IN PROGRESS  ASSESSMENT:  CLINICAL IMPRESSION: Patient arrived to session with report of improvement in R knee pain since last session and notes that she went to Urgent Care to get it cleared for fractures. Session focused on completion of assessment which revealed good cervical mobility but muscle tightness and limited lumbar mobility and muscle tightness. Addressed with gentle lumbar mobility and postural strengthening for cervical spine. Patient reported understanding of HEP and without complaints at end of appointment.   OBJECTIVE IMPAIRMENTS: Abnormal gait, decreased activity tolerance, decreased balance, decreased coordination, decreased knowledge of use of DME, decreased mobility, difficulty  walking, decreased ROM, decreased strength, impaired flexibility, impaired tone, postural dysfunction, and pain.   ACTIVITY LIMITATIONS: carrying, lifting, bending, standing, squatting, stairs, transfers, bathing, toileting, dressing, reach over head, hygiene/grooming, locomotion level, and caring for others  PARTICIPATION LIMITATIONS: meal prep, cleaning, laundry, driving, shopping, community activity, yard work, and church  PERSONAL FACTORS: Age, Behavior pattern, Past/current experiences, Time since onset of injury/illness/exacerbation, and 3+ comorbidities: CP, anxiety, depression, HA, CPD, osteoporosis, L elbow and wrist surgery, L hip pinning, R humerus ORIF are also affecting patient's functional outcome.   REHAB POTENTIAL: Good  CLINICAL DECISION MAKING: Evolving/moderate complexity  EVALUATION COMPLEXITY: Moderate  PLAN:  PT FREQUENCY: 1-2x/week  PT DURATION: 6 weeks  PLANNED INTERVENTIONS: 97164- PT Re-evaluation, 97110-Therapeutic exercises, 97530- Therapeutic activity, 97112- Neuromuscular re-education, 97535- Self Care, 02859- Manual therapy, [phone]- Gait training, [phone]- Canalith repositioning, [id number]- Electrical stimulation (manual), [phone] (1-2 muscles), [phone] (3+ muscles)- Dry Needling, Patient/Family education, Balance training, Stair training, Taping, Joint mobilization, Spinal mobilization, Vestibular training, DME instructions, Cryotherapy, and Moist heat  PLAN FOR NEXT SESSION: DGI; review and progress HEP to work on balance, LE and core strength, lumbar mobility, cervical stability    Louana Terrilyn Christians, PT, DPT 10/03/2024 12:35 PM  [facility] at [facility] [address], [address] [place], [place] [address] Phone # [phone] Fax # [phone]

## 2024-10-03 ENCOUNTER — Encounter: Payer: Self-pay | Admitting: Physical Therapy

## 2024-10-03 ENCOUNTER — Ambulatory Visit: Attending: Physical Medicine and Rehabilitation | Admitting: Physical Therapy

## 2024-10-03 DIAGNOSIS — G801 Spastic diplegic cerebral palsy: Secondary | ICD-10-CM | POA: Diagnosis present

## 2024-10-03 DIAGNOSIS — R2689 Other abnormalities of gait and mobility: Secondary | ICD-10-CM | POA: Diagnosis present

## 2024-10-03 DIAGNOSIS — M5459 Other low back pain: Secondary | ICD-10-CM | POA: Diagnosis present

## 2024-10-03 DIAGNOSIS — M542 Cervicalgia: Secondary | ICD-10-CM | POA: Insufficient documentation

## 2024-10-03 DIAGNOSIS — R2681 Unsteadiness on feet: Secondary | ICD-10-CM | POA: Diagnosis present

## 2024-10-07 NOTE — Therapy (Signed)
 " OUTPATIENT PHYSICAL THERAPY NEURO TREATMENT   Patient Name: TEEA DUCEY MRN: [id number] DOB:04-Nov-1965, 58 y.o., female Today's Date: 10/08/2024   PCP: Onita Rush, MD  REFERRING PROVIDER: Nick Suzen Bucker, DO   END OF SESSION:  PT End of Session - 10/08/24 1227     Visit Number 3    Number of Visits 13    Date for Recertification  10/28/24    Authorization Type Aetna    Authorization - Number of Visits 100    PT Start Time 1149    PT Stop Time 1229    PT Time Calculation (min) 40 min    Equipment Utilized During Treatment Gait belt    Activity Tolerance Patient tolerated treatment well    Behavior During Therapy [facility] for tasks assessed/performed   pt became emotional during session           Past Medical History:  Diagnosis Date   Abnormal Pap smear 2011   hpv/mild dysplasia,cin1   Anxiety    Cerebral palsy (HCC)    right arm/leg   Cystocele    Depression    Headache    Neuromuscular disorder (HCC)    Cerebral Palsy   OCD (obsessive compulsive disorder)    Osteoporosis    Uterine prolaps    Past Surgical History:  Procedure Laterality Date   COLPOSCOPY  2011   ELBOW SURGERY     left elbow-separation of bones -age 59   ELBOW SURGERY  2015   fell on ice- right elbow fracture   FRACTURE SURGERY     HAMMER TOE SURGERY  1/13   RIGHT SIDE   HIP PINNING,CANNULATED Left 07/08/2016   Procedure: LEFT CLOSED REDUCTION HIP AND PERCUTANEOUS SCREW;  Surgeon: Donnice Car, MD;  Location: [facility];  Service: Orthopedics;  Laterality: Left;   ORIF HUMERUS FRACTURE Right 06/11/2021   Procedure: OPEN REDUCTION INTERNAL FIXATION (ORIF) DISTAL HUMERUS FRACTURE;  Surgeon: Celena Sharper, MD;  Location: [facility];  Service: Orthopedics;  Laterality: Right;   WRIST SURGERY  2005   left wrist   Patient Active Problem List   Diagnosis Date Noted   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HCC) 08/03/2018   Relationship problem with family  member 08/03/2018   Closed fracture of neck of left femur, initial encounter (HCC) 07/08/2016   Severe recurrent major depression without psychotic features (HCC) 06/03/2015    Class: Chronic   Obsessive-compulsive disorder 06/03/2015    Class: Chronic    ONSET DATE: congenital   REFERRING DIAG: G80.1 (ICD-10-CM) - Spastic diplegic cerebral palsy M54.2 (ICD-10-CM) - Cervicalgia R29.3 (ICD-10-CM) - Abnormal posture Z87.81 (ICD-10-CM) - Personal history of (healed) traumatic fracture  THERAPY DIAG:  Spastic diplegic cerebral palsy (HCC)  Unsteadiness on feet  Other abnormalities of gait and mobility  Cervicalgia  Other low back pain  Rationale for Evaluation and Treatment: Rehabilitation  SUBJECTIVE:  SUBJECTIVE STATEMENT: Had a fall, fell over a basket and hurt the L side of the neck and shoulder. Denies N/T or radiation. Patient also reports really bad migraine currently. Denies dizziness.    Pt accompanied by: self  PERTINENT HISTORY: CP, anxiety, depression, HA, CPD, osteoporosis, L elbow and wrist surgery, L hip pinning, R humerus ORIF  PAIN:  Are you having pain? Yes: NPRS scale: 8/10 Pain location: L neck  Pain description: sharp Aggravating factors: fall Relieving factors: TENS, chiropractor   PRECAUTIONS: Fall, osteoporosis  RED FLAGS: None   WEIGHT BEARING RESTRICTIONS: No  FALLS: Has patient fallen in last 6 months? Yes. 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 Reports 4/10 pain   DGI  14/24     [facility] PT Assessment - 10/08/24 0001       Standardized Balance Assessment   Standardized Balance Assessment Dynamic Gait Index      Dynamic Gait Index   Level Surface Mild Impairment    Change in Gait Speed Moderate Impairment    Gait with Horizontal Head Turns Mild Impairment    Gait with Vertical Head Turns Mild Impairment    Gait and Pivot Turn Moderate Impairment    Step Over Obstacle Mild Impairment    Step Around Obstacles Mild Impairment    Steps Mild Impairment    Total Score 14          PATIENT EDUCATION: Education details: advised pt to use handrail for stairs for safety; encourage AD on uneven surfaces d/t fall risk, discussed pt's upcoming appointment  Person educated: Patient Education method: Explanation Education comprehension: verbalized understanding     HOME EXERCISE PROGRAM Last updated: 10/03/24 Access Code: [email or web address] URL: [email or web address] Date: 10/03/2024 Prepared by: [facility] - Outpatient  Rehab - [facility]  Exercises - Supine Lower Trunk Rotation  - 1 x daily - 5 x weekly - 2 sets - 10 reps - Supine Pelvic Tilt  - 1 x daily - 5 x weekly - 2 sets - 10 reps - Sidelying Thoracic Rotation with Open Book  - 1 x daily - 5 x weekly - 2 sets - 10 reps - Cervical Retraction with Overpressure  - 1 x daily - 5 x weekly - 2 sets - 10 reps - 3 sec hold   Note: Objective measures were completed at Evaluation unless otherwise noted.  DIAGNOSTIC FINDINGS: none recent  COGNITION: Overall cognitive status: Within functional limits for tasks assessed   SENSATION: Pt denies changes in N/T  COORDINATION: Alternating pronation/supination: motor limitations on R  UE Alternating toe tap: motor limitations on R LE Finger to nose: motor limitations on R UE; mild tremor on L   Observation: no swelling or bruising over R knee   MUSCLE TONE: slightly increased extensor tone in R quad; no knee pain with testing    POSTURE: rounded shoulders; head tilted slightly   LOWER EXTREMITY ROM:     Active  Right Eval  Left Eval  Hip flexion    Hip extension    Hip abduction    Hip adduction    Hip internal rotation    Hip external rotation    Knee flexion    Knee extension    Ankle dorsiflexion 3 11  Ankle plantarflexion    Ankle inversion    Ankle eversion     (Blank rows = not tested)  LOWER EXTREMITY MMT:    MMT (in sitting) Right Eval Left Eval  Hip flexion 4 4+  Hip extension    Hip abduction 4- 4+  Hip adduction 4- 4+  Hip internal rotation    Hip external rotation    Knee flexion 4 4  Knee extension 4 5  Ankle dorsiflexion 3+ 5  Ankle plantarflexion 3+ 4+  Ankle inversion    Ankle eversion    (Blank rows = not tested) *no knee pain during testing   GAIT: Findings: Assistive device utilized:None, Level of assistance: Modified independence and SBA, and Comments: R knee adduction, lateral trunk lean, reduced R foot clearance with foot in eversion  FUNCTIONAL TESTS:  5 times sit to stand: 19.23 sec without UE support; retropulsion and limited descending control  10 meter walk test: 14.15 sec (2.32 ft/sec)                                                                                                                                TREATMENT DATE: 09/09/24     PATIENT EDUCATION: Education details: advised pt to make appt with PCP about R knee pain; advised pt that I was not aware of any CP support groups in the area unfortunately; prognosis, POC  Person educated: Patient Education method: Explanation Education comprehension: verbalized understanding  HOME EXERCISE PROGRAM: Not yet initiated   GOALS: Goals reviewed with  patient? Yes  SHORT TERM GOALS: Target date: 09/30/2024  Patient to be independent with initial HEP. Baseline: HEP initiated Goal status: INITIAL    LONG TERM GOALS: Target date: 10/28/2024  Patient to be independent with advanced HEP. Baseline: Not yet initiated  Goal status: INITIAL  Patient to recall 3 ways to manage neck and LBP. Baseline: edu not yet initiated Goal status: IN PROGRESS  Patient to demonstrate gait speed of 2.8 ft/sec in order to improve access to community.  Baseline: 2.32 ft/sec Goal status: IN PROGRESS  Patient to score at least 18/24 on DGI in order to decrease risk of falls.  Baseline: 14/24 10/08/24 Goal status: IN PROGRESS 10/08/24  Patient to demonstrate 5xSTS test in <15 sec in order to decrease risk of falls.  Baseline: 19.23 sec without UE support; retropulsion and limited descending control  Goal status: IN PROGRESS  Patient to report understanding of fall prevention education. Baseline: not yet initiated Goal status: IN PROGRESS  ASSESSMENT:  CLINICAL IMPRESSION: Patient arrived to session with report of a fall since last session, resulting in increased L sided neck pain. Patient  denies N/T or radiation of pain. Patient responded very well from MT and reported reduction to 4/10 pain. Patient scored 14/24 on DGI, indicating an increased risk of falls. Discussed use of AD for safety d/t pt's recent falls. Patient tolerated session well and without complaints at end of appointment.   OBJECTIVE IMPAIRMENTS: Abnormal gait, decreased activity tolerance, decreased balance, decreased coordination, decreased knowledge of use of DME, decreased mobility, difficulty walking, decreased ROM, decreased strength, impaired flexibility, impaired tone, postural dysfunction, and pain.   ACTIVITY LIMITATIONS: carrying, lifting, bending, standing, squatting, stairs, transfers, bathing, toileting, dressing, reach over head, hygiene/grooming, locomotion level, and  caring for others  PARTICIPATION LIMITATIONS: meal prep, cleaning, laundry, driving, shopping, community activity, yard work, and church  PERSONAL FACTORS: Age, Behavior pattern, Past/current experiences, Time since onset of injury/illness/exacerbation, and 3+ comorbidities: CP, anxiety, depression, HA, CPD, osteoporosis, L elbow and wrist surgery, L hip pinning, R humerus ORIF are also affecting patient's functional outcome.   REHAB POTENTIAL: Good  CLINICAL DECISION MAKING: Evolving/moderate complexity  EVALUATION COMPLEXITY: Moderate  PLAN:  PT FREQUENCY: 1-2x/week  PT DURATION: 6 weeks  PLANNED INTERVENTIONS: 97164- PT Re-evaluation, 97110-Therapeutic exercises, 97530- Therapeutic activity, 97112- Neuromuscular re-education, 97535- Self Care, 02859- Manual therapy, [phone]- Gait training, [phone]- Canalith repositioning, [id number]- Electrical stimulation (manual), [phone] (1-2 muscles), [phone] (3+ muscles)- Dry Needling, Patient/Family education, Balance training, Stair training, Taping, Joint mobilization, Spinal mobilization, Vestibular training, DME instructions, Cryotherapy, and Moist heat  PLAN FOR NEXT SESSION: trial trekking pole vs. Cane, review and progress HEP to work on balance, LE and core strength, lumbar mobility, cervical stability    Louana Terrilyn Christians, [place], DPT 10/08/2024 12:31 PM  [facility] Re[facility] at [facility] [address] [place], [place] [address] Phone # [phone] Fax # [phone]         "

## 2024-10-08 ENCOUNTER — Ambulatory Visit: Admitting: Psychiatry

## 2024-10-08 ENCOUNTER — Encounter: Payer: Self-pay | Admitting: Physical Therapy

## 2024-10-08 ENCOUNTER — Ambulatory Visit: Admitting: Physical Therapy

## 2024-10-08 ENCOUNTER — Encounter: Payer: Self-pay | Admitting: Psychiatry

## 2024-10-08 DIAGNOSIS — F4010 Social phobia, unspecified: Secondary | ICD-10-CM

## 2024-10-08 DIAGNOSIS — M542 Cervicalgia: Secondary | ICD-10-CM

## 2024-10-08 DIAGNOSIS — G43009 Migraine without aura, not intractable, without status migrainosus: Secondary | ICD-10-CM

## 2024-10-08 DIAGNOSIS — F422 Mixed obsessional thoughts and acts: Secondary | ICD-10-CM | POA: Diagnosis not present

## 2024-10-08 DIAGNOSIS — R2689 Other abnormalities of gait and mobility: Secondary | ICD-10-CM

## 2024-10-08 DIAGNOSIS — R2681 Unsteadiness on feet: Secondary | ICD-10-CM

## 2024-10-08 DIAGNOSIS — M5459 Other low back pain: Secondary | ICD-10-CM

## 2024-10-08 DIAGNOSIS — F5105 Insomnia due to other mental disorder: Secondary | ICD-10-CM | POA: Diagnosis not present

## 2024-10-08 DIAGNOSIS — F339 Major depressive disorder, recurrent, unspecified: Secondary | ICD-10-CM | POA: Diagnosis not present

## 2024-10-08 DIAGNOSIS — G801 Spastic diplegic cerebral palsy: Secondary | ICD-10-CM | POA: Diagnosis not present

## 2024-10-08 NOTE — Progress Notes (Signed)
 Casey Diaz [id number] 01/04/1966 58 y.o.    Subjective:   Patient ID:  Casey Diaz is a 58 y.o. (DOB 11-Aug-1966) female.  Chief Complaint:  No chief complaint on file.    HPI Casey Diaz presents to the office today for follow-up of OCD and severe anxiety.     December 2019 visit the following was noted: No meds were changed. Lives in [place] and back for followup.  Sx are about the same.  Has to take meds with different sizes. Pt reports that mood is Anxious and Depressed and describes anxiety as Severe. Anxiety symptoms include: Excessive Worry, Obsessive Compulsive Symptoms:   Checking,,. Pt reports has interrupted sleep and nocturia. Pt reports that appetite is good. Pt reports that energy is no change and down slightly. Concentration is down slightly. Suicidal thoughts:  denied by patient. Loves the environment of [place] but misses some things there.  She's not able to work there.  H works there and likes it.  Struggled with not working, feels isolated and not up to task of meeting people.  Does attend a church and met a friend who's been helpful.  Leaving for [place] on 10/16/18.   04/09/2020 appointment the following is noted:  Staying another year in [place] bc Covid and other things. Last few months a lot of crying spells.  Is in menopause. Wonders about med changes though is nervous about it.  Crying spells associated with depressing thoughts more than stress or OCD.   Covid really hard on everyone and couldn't see family for 18 mos.  Family still very dysfunctional. No close friends in part due to OCD and depression. Son high Autism spectrum with ADHD and anxiety and she's with him all the time. Greater health problems with CP so more pains.   05/15/20 appt with the following noted: Started Estroven for menopause and helps some. Still depressed.  Chronically. In [place]  for 2 more weeks then to [place] for another year. A lot of stressors lately triggering more checking  and anxiety.   OCD is her CC now and seems.  Got worse DT stress.   Stressed with Asberger's son and her health.  H works a lot.  Her FOO still stress. Plan: Trintellix 10 mg 1 tablet in the morning with food and reduce fluvoxamine  to 5 tablets nightly for 1 week  then reduce it to 4 tablets nightly.   07/02/20 appt with the following noted: Decided not to get Trintellix bc difficulty getting it. It is available.  There.  Wants to start it now.   Both depression and OCD are severe.  Not suicidal in intent or plan. Did not take samples with her to [place] but will be back in December. covid is worse there and travel is difficult.  Wants to reduce Wellbutrin  DT dry mouth. Plan: She's afraid to reduce Luvox  at this time DT fear of worsening OCD.  But will consider. Trintellix 10 mg 1 tablet in the morning with food and reduce fluvoxamine  to 5 tablets nightly for 1 week  then reduce it to 4 tablets nightly. Also reduce Wellbutrin  XL to 300 mg daily.    9-13 2022 appointment with the following noted: [place]  Broke arm a month ago and surgery.  It's all been rough adjustment.   B has cancer on his face and M fell taking him to the doctor.  Misses the water and weather of [place].   Cry a lot more since  menopause. Still depression and anxiety and OCD.  Asks about ketamine. On Wellbutrin  300, Luvox  300.  No Trintellix. Added Ativan  2 mg AM and HS and it helps.  More likely to get upset at night. Plan: Increase Luvox  back to 400 mg daily.  She thinks she's worse on less. Continue Wellbutrin  XL to 300 mg daily. Plan to start Spravato  for TRD asap   09/27/2021 appointment with the following noted:  She has started Spravato  today at 56 mg intranasally.  She tolerated it well without unusual nausea or vomiting headache or other somatic symptoms.  She did have the expected dissociation which gradually resolved over the course of the 2-hour period of observation.  She was a little  concerned about her balance given her cerebral palsy but has not noted unusual or unexpected problems.  She is motivated to can continue Spravato  in hopes of reducing her depressive symptoms. 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  She also has treatment resistant OCD which is partially managed with medications but is still quite disabling.  She is tolerating the medications well.  She is sleeping adequately.  Her appetite is adequate.  She is not having suicidal thoughts.  She continues to wish for a better treatment for OCD that would give her some relief.  09/30/2021 appointment with the following noted: She received her first dose of Spravato  84 mg intranasally today.  She tolerated it well without unusual nausea, vomiting, or other somatic symptoms.  Dissociation as expected did occur and gradually resolved over the 2-hour period of observation.  She did have a mild headache today with the treatment and received ibuprofen 600 mg at her request.  We will follow this to see if it is a pattern Patient is still depressed.  She said she was late with her medicine today and today was a particularly depressing day.  However she notes that the Spravato  has lifted her mood considerably even today.  She is hopeful that it will continue to be helpful.  No suicidal thoughts.  She has ongoing chronic anxiety and OCD at baseline.  10/04/21 appt noted: Patient received Spravato  84 mg for the second time today.  She tolerated it well without any unusual headache, nausea or vomiting or other somatic symptoms.  Dissociation did occur and she gradually [place] resolution over the 2-hour period of observation. She did not have any unusual problems after she left the office last Spravato  administration.  She did not have any specific problems with balance or walking.  She is at increased risk of that difficulty because of cerebral palsy.  So far she has not noticed much mood effect from  the medication beyond the first day of receiving it.  However she would like to continue Spravato  in hopes of getting the antidepressant effect that is desired. Stress dealing with mother's behavior at party pt hosted.  Guilt over it.  10/07/2021 appointment noted: Patient received Spravato  84 mg for the second time today.  She tolerated it well without any unusual headache, nausea or vomiting or other somatic symptoms.  Dissociation did occur and she gradually [place] resolution over the 2-hour period of observation. She still is not sure about the antidepressant effect of Spravato .  Events over the holidays and demands, make it difficult to assess.  She still notes that the OCD tends to worsen the depression and vice versa.  She tolerates the Spravato  well and wants to continue the trial.  10/15/2021 appointment with the following  noted: Patient received Spravato  84 mg for the second time today.  She tolerated it well without any unusual headache, nausea or vomiting or other somatic symptoms.  Dissociation did occur and she gradually [place] resolution over the 2-hour period of observation. Patient says it was somewhat difficult to evaluate the effect of the Spravato .  It was scheduled to be twice weekly for 4 weeks consecutively but the holidays have interfered with that administration.  She asked what specifically should be she should be looking for in order to assess improvement.  That was discussed.  The OCD is unchanged and the depression so far is not significantly different.  She still tolerates meds.  There have been no recent med changes  10/19/2021 appt noted: Patient received Spravato  84 mg for the second today.  She tolerated it well without any unusual headache, nausea or vomiting or other somatic symptoms.  Dissociation did occur and she gradually saw resolution over the 2-hour period of observation.   10/21/2021 appointment noted: Patient received Spravato  84 mg today.  She tolerated it well  without any unusual headache, nausea or vomiting or other somatic symptoms.  Dissociation did occur and she gradually saw resolution over the 2-hour period of observation.  She feels better than last week.  She is not as depressed and down.  She is still dealing with grief around the death of her cousin that was unexpected.  It is still difficult to tell how much the Spravato  was doing but she is hopeful.  Anxiety is still present with the OCD.  She is not having suicidal thoughts.  She is not hopeless.  She wants to continue treatment.  10/25/2021 appointment with the following noted: Patient received Spravato  84 mg today.  She tolerated it well without any unusual headache, nausea or vomiting or other somatic symptoms.  Dissociation did occur and she gradually saw resolution over the 2-hour period of observation.  She does not typically find the dissociation very strong. She is beginning to think the Spravato  is helping somewhat with the depression.  It has been difficult to tell with the holidays intervening as well as the death of her cousin.  She has not been able to get Spravato  twice weekly for 4 weeks straight as typically planned.  However she is hopeful.  The OCD remains significant.  She still has a tendency to think very negatively.  She is not suicidal.  10/28/2021 appointment with the following noted: Patient received Spravato  84 mg today.  She tolerated it well without any unusual headache, nausea or vomiting or other somatic symptoms.  Dissociation did occur and she gradually saw resolution over the 2-hour period of observation.  She does not typically find the dissociation very strong. She is feeling more hopeful about the administration of Spravato .  She is having less depression she believes.  Still not dramatically different.  She still has a tendency to have a lot of anxiety and rumination and OCD.  She is not suicidal.  She is eager to continue the Spravato .  11/01/2021 appointment with  the following noted: Patient received Spravato  84 mg today.  She tolerated it well without any unusual headache, nausea or vomiting or other somatic symptoms.  Dissociation did occur and she gradually saw resolution over the 2-hour period of observation.  She does not typically find the dissociation very strong. She is continuing to see a little bit of improvement in depression with Spravato .  The anxiety remains but may be not as severe.  The  OCD remains markedly severe chronically.  She is not suicidal.  She is encouraged by the degree of improvement with Spravato  and inability to enjoy things more and not be quite as ruminative.  11/04/2021 appt noted: Patient received Spravato  84 mg today.  She tolerated it well without any unusual headache, nausea or vomiting or other somatic symptoms.  Dissociation did occur and she gradually saw resolution over the 2-hour period of observation.  She does not typically find the dissociation very strong. No SE complaints with meds. She continues to feel hopeful about the Spravato .  She has less depression.  Because of a number of factors she is uncertain of the full benefit but thinks she is somewhat less depressed.  Her anxiety and OCD remain significant but a little better.  She is tolerating the medications and does not desire medicine change.  She is not currently complaining of insomnia.   11/08/2021 appointment the following noted: Patient received Spravato  84 mg today.  She tolerated it well without any unusual headache, nausea or vomiting or other somatic symptoms.  Dissociation did occur and she gradually saw resolution over the 2-hour period of observation.  She does not typically find the dissociation very strong. No SE complaints with meds. She feels the Spravato  is helping somewhat.  She would like to see a greater effect.  However she is able to enjoy things.  She is productive at home.  She would like to see a lifting of a degree of sadness that  remains.  The anxiety and OCD remained largely unchanged.  She wondered about the dosing of Wellbutrin  300 mg a day and Luvox  300 mg a day and possible increases.  She has been at higher doses in the past.  She plans to start water therapy for her weakness and for her shoulder.  11/11/2021 appointment with the following noted: Patient received Spravato  84 mg today.  She tolerated it well without any unusual headache, nausea or vomiting or other somatic symptoms.  Dissociation did occur and she gradually saw resolution over the 2-hour period of observation.  She does not typically find the dissociation very strong. No SE complaints with meds. She feels the Spravato  is clearly helping the depression.  She would like to see a more significant effect.  She is still having trouble thinking positive. Her energy is fair.  Concentration is good except for the problem with chronic obsessions. She has been taking Wellbutrin  300 mg in Luvox  300 mg for quite some time but has taken higher doses in the past.  We discussed that.  She would like to try higher doses in order to get a better effect if possible. We just increased the doses a couple of days ago.  No effect yet.  11/15/2021 appointment with the following noted: Patient received Spravato  84 mg today.  She tolerated it well without any unusual headache, nausea or vomiting or other somatic symptoms.  Dissociation did occur and she gradually saw resolution over the 2-hour period of observation.  She does not typically find the dissociation very strong. No SE complaints with meds. The patient is now convinced that the Spravato  is helping the depression.  She would like to continue twice weekly Spravato  this week if possible.  She has tolerated the increase in Wellbutrin  to 450 mg daily and the increase and fluvoxamine  to 400 mg daily without complications thus far.  The OCD and anxiety feed the depression to some extent. She spends approximately 2 hours daily  with checking  compulsions due to obsessions about causing harm to others.  For example fearing that when she has hit a pot hole that she may have hit a person and going back to check.  Checking corners and rooms out of fear that she may have harmed someone.  Other various checking compulsions.  She is hoping the increase in fluvoxamine  to 400 mg will reduce that over the weeks to come.  She is not seeing a significant difference with the addition of the Spravato  though she understands that was not expected.  She is more productive at home and more motivated and able to enjoy things more fully as a result of the Spravato  treatment.  She is tolerating the medication  11/18/2021 appointment with the following noted: Patient received Spravato  84 mg today.  She tolerated it well without any unusual headache, nausea or vomiting or other somatic symptoms.  Dissociation did occur and she gradually saw resolution over the 2-hour period of observation.  She does not typically find the dissociation very strong. No SE complaints with meds. She clearly believes the Spravato  has been helpful for the depression.  She wonders whether to continue to treatments weekly or to cut back to 1 weekly.  She would like to continue twice weekly in hopes of getting additional improvement in the depression because it is not resolved but it is difficult to get here twice a week in terms of arranging rides. She is recently increased Wellbutrin  XL to 450 mg daily and fluvoxamine  to 400 mg daily but they have not had time to have an official effect.  She is tolerating that well.  She is tolerating meds overwork overall well. The OCD remains the same as noted on 11/15/2021  11/25/21 appt noted: Patient received Spravato  84 mg today.  She tolerated it well without any unusual headache, nausea or vomiting or other somatic symptoms.  Dissociation did occur and she gradually saw resolution over the 2-hour period of observation.  She does not  typically find the dissociation very strong. No SE complaints with meds. She thinks the increase in Wellbutrin  and Luvox  have been potentially helpful for depression and OCD respectively.  It has been too early to see the full effect.  She is sleeping and eating well.  She is functioning at home.  She still spends a lot of time that is about 2 hours a day dealing with compulsive behaviors.  12/02/21 appt noted: Patient received Spravato  84 mg today.  She tolerated it well without any unusual headache, nausea or vomiting or other somatic symptoms.  Dissociation did occur and she gradually saw resolution over the 2-hour period of observation.  She does not typically find the dissociation very strong. No SE complaints with meds. Several losses and stressors recently that affect her sense of mood. However still sees significant benefit from the Spravato  for her depression.  Wants to continue it. Suspect early  some benefit from the increased Wellbutrin  for depression and Luvox  for OCD. Tolerating meds. No complaints about the meds. Sleeping and eating well.  No new health concerns.  12/09/21 appt noted: Patient received Spravato  84 mg today.  She tolerated it well without any unusual headache, nausea or vomiting or other somatic symptoms.  Dissociation did occur and she gradually saw resolution over the 2-hour period of observation.  She does not typically find the dissociation very strong. No SE complaints with meds. Seeing noticeable improvement from increase fluvoxamine  to 400 mg daily.  Tolerating meds without concerns over them. Depression  is stable with residual sx of easy guilt and easily stressed.  OCD contributes to depression but depression is not severe with less crying spells.  Productive at home with chores.  Enjoyed recent birthday.  Sleeping good. No new concerns.  12/23/2021 appointment noted: Patient received Spravato  84 mg today.  She tolerated it well without any unusual headache,  nausea or vomiting or other somatic symptoms.  Dissociation did occur and she gradually saw resolution over the 2-hour period of observation.  She does not typically find the dissociation very strong. No SE complaints with meds. Seeing noticeable improvement from increase fluvoxamine  to 400 mg daily.  Tolerating meds without concerns over them. Her depression is somewhat improved with the Spravato .  She also feels generally a little lighter.  She is more motivated.  She is less overwhelmed by guilt.  The OCD is gradually improving but is still quite time-consuming as noted before.  She is sleeping well.  No side effects  12/30/2021 appointment with the following noted: Patient received Spravato  84 mg today.  She tolerated it well without any unusual headache, nausea or vomiting or other somatic symptoms.  Dissociation did occur and she gradually saw resolution over the 2-hour period of observation.  She does not typically find the dissociation very strong. No SE complaints with meds. Seeing noticeable improvement from increase fluvoxamine  to 400 mg daily.  Tolerating meds without concerns over them. She is confident of her the improvement seen with Spravato .  She is less hopeless.  Guilt is marked remarkably improved.  She is not having any thoughts of death or dying.  She is more motivated for activities such as exercise which she is recently started.  She is sleeping well. The OCD remains severe but it is improving somewhat with the increase in fluvoxamine .  It is still consuming a couple hours per day.  01/10/22 apravato 84 admin  01/24/22 appt noted: Patient received Spravato  84 mg today.  She tolerated it well without any unusual headache, nausea or vomiting or other somatic symptoms.  Dissociation did occur and she gradually saw resolution over the 2-hour period of observation.  She does not typically find the dissociation very strong. No SE complaints with meds. Very tearful today.  Feels like  she has been suppressing emotion in the Spravato  caused it to be released.  Discussed some stressors.  Overall still feels the medicine is helpful.  She has missed some of the scheduled Spravato  treatments that were intended to be weekly due to circumstances beyond her control.  She is still struggling with OCD as previously noted but does believe the medications are helpful. Plan no med changes  01/31/2022 received Spravato  84 mg today  02/09/2022 appointment with the following noted: Patient received Spravato  84 mg today.  She tolerated it well without any unusual headache, nausea or vomiting or other somatic symptoms.  Dissociation did occur and she gradually saw resolution over the 2-hour period of observation.  She does not typically find the dissociation very strong. No SE complaints with meds. Spravato  clearly helps depression and OCD but easily gets overwhelmed and tearful with fairly routine stressors.  Tolerating meds. Sleep and appetite is OK Asks to increase lorazepam  to 2 mg AM and HS and 1mg  afternoon  02/16/22 appt noted: Patient received Spravato  84 mg today.  She tolerated it well without any unusual headache, nausea or vomiting or other somatic symptoms.  Dissociation did occur and she gradually saw resolution over the 2-hour period of observation.  She  does not typically find the dissociation very strong. No SE complaints with meds. She has chronic depesssion and OCD but is improved with Spravato , both dx versus before.  She has continued Luvox  400 mg and Wellbutrin  450 mg and is tolerating it.  Chronically easily stressed.  Tolerating all meds.  Doesn't like taking more meds.  Spending a couple hours daily with OCD.  No SI No med changes.  02/21/22 appt noted:   Doesn't like taking more meds.  Spending a couple hours daily with OCD.  No SI No med changes.  02/21/22 appt noted: Patient received Spravato  84 mg today.  She tolerated it well without any unusual headache, nausea or  vomiting or other somatic symptoms.  Dissociation did occur and she gradually saw resolution over the 2-hour period of observation.  She does not typically find the dissociation very strong. No SE complaints with meds. She has chronic depesssion and OCD but is improved with Spravato , both dx versus before.  She has continued Luvox  400 mg and Wellbutrin  450 mg and is tolerating it.  Chronically easily overwhelmed and doesn't know why.  Tolerating all meds. Wants to continue meds.  03/16/22 appt noted: Patient received Spravato  84 mg today.  She tolerated it well without any unusual headache, nausea or vomiting or other somatic symptoms.  Dissociation did occur and she gradually saw resolution over the 2-hour period of observation.  She does not typically find the dissociation very strong. No SE complaints with meds. Overall she still feels the Spravato  has been helpful not only for her depression but also for her OCD which was somewhat unexpected.  OCD is still significant but it is less severe than prior to starting Spravato .  She is tolerating Luvox  400 mg and Wellbutrin  450 mg.  We discussed possible med adjustments.  03/23/22 appt noted: Patient received Spravato  84 mg today.  She tolerated it well without any unusual headache, nausea or vomiting or other somatic symptoms.  Dissociation did occur and she gradually saw resolution over the 2-hour period of observation.  She does not typically find the dissociation very strong. No SE complaints with meds. She is still depressed and still has OCD of course but is improved with the Spravato .  She is tolerating the medications well.  We had previously discussed the possibility of switching some of the Wellbutrin  to [place]vel[place]  and she is very interested in that in hopes of further improvement in depression and OCD.  She understands that Auvelity  is not used for OCD on the label.  She is tolerating the medications.  She is still easily overwhelmed.  She is  sleeping and eating okay.. Plan: Reduce Wellbutrin  XL to 300 mg AM and add Auvelity  1 tablet each AM  03/30/22 appt noted: Patient received Spravato  84 mg today.  She tolerated it well without any unusual headache, nausea or vomiting or other somatic symptoms.  Dissociation did occur and she gradually saw resolution over the 2-hour period of observation.  She does not typically find the dissociation very strong. No SE complaints with meds. She is still depressed and still has OCD of course but is improved with the Spravato .  She is tolerating the medications well.  No difference with Auvelity  1 AM so far and no SE.  Going on vacation on Saturday. 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 Sleep and appetite good. Plan: Increase Auvelity  to 1 twice daily and reduce Wellbutrin  to XL 150 every morning  04/14/2022 appointment with the following noted: Patient received  Spravato  84 mg today.  She tolerated it well without any unusual headache, nausea or vomiting or other somatic symptoms.  Dissociation did occur and she gradually saw resolution over the 2-hour period of observation.  She does not typically find the dissociation very strong. No SE complaints with meds. She is still depressed and still has OCD of course but is improved with the Spravato .  She is tolerating the medications well.  Just increased Auvelity  to BID yesterday and reduced Wellbutrin  to 150 AM. No SE so far.  No change in mood or anxiety so far.  Chronic OCD as noted and residual depression and chronic fatigue.  04/21/2022 appointment with the following noted: Patient received Spravato  84 mg today.  She tolerated it well without any unusual headache, nausea or vomiting or other somatic symptoms.  Dissociation did occur and she gradually saw resolution over the 2-hour period of observation.  She does not typically find the dissociation very strong. No SE complaints with meds. She is still depressed and still has OCD of course  but is improved with the Spravato .   She has questions about the dosing of lorazepam . She tends to have negative anxious thoughts at night.  This tends to interfere with her ability to go to sleep.  She is getting about 8 to 9 hours of sleep.  She is tolerating the meds without excessive sedation and does not nap during the day.  05/06/22 appt noted: Patient received Spravato  84 mg today.  She tolerated it well without any unusual headache, nausea or vomiting or other somatic symptoms.  Dissociation did occur and she gradually saw resolution over the 2-hour period of observation.  She does not typically find the dissociation very strong. No SE complaints with meds. She is still depressed and still has OCD of course but is improved with the Spravato .   Had some questions about timing of dosing of fluvoxamine  and Auvelity . OCD is not quite as time consuming.  Sleep and eating are the same.   No SE meds.  05/25/22 appt noted: Patient received Spravato  84 mg today.  She tolerated it well without any unusual headache, nausea or vomiting or other somatic symptoms.  Dissociation did occur and she gradually saw resolution over the 2-hour period of observation.  She does not typically find the dissociation very strong. No SE complaints with meds. She is still depressed and still has OCD of course but is improved with the Spravato .   She has less OCD when away from home and on vacation of note. Plan: Rec gradually reduce HS lorazepam  to 1 mg Hs.  Can continue lorazepam  2 mg AM and 1 mg in afternoon bc of chronic anxiety and it is helpful and tolerated. She can continue temazepam  30 mg nightly.  She tends to have a lot of anxious negative thoughts at night when she is trying to go to bed  06/16/22 appt noted: Patient received Spravato  84 mg today.  She tolerated it well without any unusual headache, nausea or vomiting or other somatic symptoms.  Dissociation did occur and she gradually saw resolution over the  2-hour period of observation.  She does not typically find the dissociation very strong. No SE complaints with meds. She is still depressed and still has OCD of course but is improved with the Spravato .   She has less OCD when away from home and on vacation of note. She is tolerating the medications.  She has continued current medications. 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  She still receives benefit from Spravato .  Her depression and OCD are better with the Spravato .  She thinks she is getting additional benefit with the switch from Wellbutrin  to Auvelity .  07/04/2022 appointment noted: Reports she developed a rash on her face from Auvelity  and feels like she is allergic to it.  She stopped it and went back to Wellbutrin  450 mg every morning.  The rash has cleared up.  She did not require any medical attention and did not have shortness of breath. Overall her depression and OCD are about the same as they have been.  She did not notice a substantial difference from the brief treatment with Auvelity  but she understands she did not take a full course.  She is tolerating the current medicines well. Current meds fluvoxamine  400 mg daily, Wellbutrin  XL 450 mg daily, lorazepam  1 to 2 mg in the morning and 1 to 2 mg at night and 1 mg in the afternoon, temazepam  30 mg nightly. She wants to continue the Spravato  because she feels it has been helpful for both her depression and her racing OCD  07/18/22 appt noted: Patient received Spravato  84 mg today.  She tolerated it well without any unusual headache, nausea or vomiting or other somatic symptoms.  Dissociation did occur and she gradually saw resolution over the 2-hour  period of observation.  She does not typically find the dissociation very strong. No SE complaints with meds. She is still depressed and still has OCD of course but is improved with the Spravato .  Rash better off Auvelity  and back on Welllbutrin XL 450 mg AM, fluvoxamine  400 mg daily.  08/15/22 appt noted: Current psych meds: Wellbutrin  XL 450 mg AM, fluvoxamine  100 mg in AM and 300 mg HS, lorazepam  1 mg 1-2 mg in the AM and HS and 1 tablet prn midday for anxiety, temazepam  30 mg HS Patient received Spravato  84 mg today.  She tolerated it well without any unusual headache, nausea or vomiting or other somatic symptoms.  Dissociation did occur and she gradually saw resolution over the 2-hour period of observation.  She does not typically find the dissociation very strong. No SE complaints with meds. She has a great deal of stress dealing with her family.  Disc brother's ongoing mania and difficulty getting him help and the stress he causes for the family. She wants to continue Spravato  through this very stressful holdicay season and reevaluate the frequency after the New Year.  09/12/22 appt noted: Current psych meds: Wellbutrin  XL 450 mg AM, fluvoxamine  100 mg in AM and 300 mg HS, lorazepam  1 mg 1-2 mg in the AM and HS and 1 tablet prn midday for anxiety, temazepam  30 mg HS Patient received Spravato  84 mg today.  She tolerated it well without any unusual headache, nausea or vomiting or other somatic symptoms.  Dissociation did occur and she gradually saw resolution over the 2-hour period of observation.  She does not typically find the dissociation very strong. No SE complaints with meds. She has a great deal of stress dealing with her family.  Disc brother's ongoing mania and difficulty getting him help and the stress he causes for the  family. She wants to continue Spravato  through this very stressful holdicay season and reevaluate the frequency after the New Year.  Chronically easily overwhelmed  with family. Complaining of HA and history migraine.  Asks for increase imitrex  and disc preventatives like propranolol  ER  09/26/22 appt noted: Current psych meds: Wellbutrin  XL 450 mg AM, fluvoxamine  100 mg in AM and 300 mg HS, lorazepam  1 mg 1-2 mg in the AM and HS and 1 tablet prn midday for anxiety, temazepam  30 mg HS Patient received Spravato  84 mg today.  She tolerated it well without any unusual headache, nausea or vomiting or other somatic symptoms.  Dissociation did occur and she gradually saw resolution over the 2-hour period of observation.  She does not typically find the dissociation very strong. No SE complaints with meds. She has a great deal of stress dealing with her family.  This is ongoing The holidays are much more stressful DT family problems.  She is noting OCD is much worse over the last couple of week.  Depression is better with Spravato . Needed higher dose meds for migraine.   10/03/22 appt noted: Current psych meds: Wellbutrin  XL 450 mg AM, fluvoxamine  100 mg in AM and 300 mg HS, lorazepam  1 mg 1-2 mg in the AM and HS and 1 tablet prn midday for anxiety, temazepam  30 mg HS Patient received Spravato  84 mg today.  She tolerated it well without any unusual headache, nausea or vomiting or other somatic symptoms.  Dissociation did occur and she gradually saw resolution over the 2-hour period of observation.  She does not typically find the dissociation very strong. No SE complaints with meds. She has now realized that the rash she previously previously attributed to Auvelity  was not related.  She is interested may be retrying that after the holidays.  She is tolerating medications otherwise. The holidays remain chronically stressful to her due to family dynamic problems which cause her to consistently feel stuck.  Under more stress her OCD is worse.  She will have a tendency to have crying spells.  The depression and OCD are still improved with Spravato  as compared to  before.  11/15/22 appt noted: Current psych meds: Wellbutrin  XL 450 mg AM, fluvoxamine  100 mg in AM and 300 mg HS, lorazepam  1 mg 1-2 mg in the AM and HS and 1 tablet prn midday for anxiety, temazepam  30 mg HS Patient received Spravato  84 mg today.  She tolerated it well without any unusual headache, nausea or vomiting or other somatic symptoms.  Dissociation did occur and she gradually saw resolution over the 2-hour period of observation.  She does not typically find the dissociation very strong. No SE complaints with meds. Continues to feel depressed and overwhelmed by family problems including her brother's mania and recent eviction and commitment.  Chronic OCD worse when stressed.  No SI.  Tolerating meds. Plan: Per her request continue Wellbutrin  XL 450 mg every morning. She has come to the realization that the rash she had previously attributed to Auvelity  was not related.  She is interested in perhaps retrying Auvelity . There are few alternative medication options that remain.  01/11/23 appt noted: Current psych meds: Wellbutrin  XL 150 mg BID and started Auvelity  1 AM, fluvoxamine  100 mg in AM and 300 mg HS, lorazepam  1 mg 1-2 mg in the AM and HS and 1 tablet prn midday for anxiety, temazepam  30 mg HS Patient received Spravato  84 mg today.  She tolerated it well without any unusual headache, nausea  or vomiting or other somatic symptoms.  Dissociation did occur and she gradually saw resolution over the 2-hour period of observation.  She does not typically find the dissociation very strong. No SE complaints with meds. Trouble getting to sessions lately DT transportation problems.   Continues to feel depressed and overwhelmed by OCD and family.  Feels she needs toevery other week Spravato  bc it helps for a couple of weeks and then seems to wear off.  Struggleing with OCD and depression both of which are eased by Spravato . Less crying with Auvelity .  02/07/23 appt noted: Current psych meds:  Wellbutrin  XL 150 mg BID and started Auvelity  1 AM, fluvoxamine  100 mg in AM and 300 mg HS, lorazepam  1 mg 1-2 mg in the AM and HS and 1 tablet prn midday for anxiety, temazepam  30 mg HS Patient received Spravato  84 mg today.  She tolerated it well without any unusual headache, nausea or vomiting or other somatic symptoms.  Dissociation did occur and she gradually saw resolution over the 2-hour period of observation.  She does not typically find the dissociation very strong. No SE complaints with meds. Trouble getting to sessions lately DT transportation problems.  This is a problem ongoing and thinks she might need to pause Spravato  bc won't be able to get her for at least 3 weeks. She is holding pretty steady with a moderate level of anxiety and depression ongoing and chronic.    03/20/23 appt noted: Current psych meds: Wellbutrin  XL 150 mg BID and started Auvelity  1 AM, fluvoxamine  100 mg in AM and 300 mg HS, lorazepam  1 mg 1-2 mg in the AM and HS and 1 tablet prn midday for anxiety, temazepam  30 mg HS Patient received Spravato  84 mg today.  She tolerated it well without any unusual headache, nausea or vomiting or other somatic symptoms.  Dissociation did occur and she gradually saw resolution over the 2-hour period of observation.  She does not typically find the dissociation very strong. No SE complaints with meds. She is trying to get back into more regular Spravato  administration.  Family issues and transportation problems that led to her missing Spravato .  She feels more depressed without regular Spravato .  Her OCD is chronic but also worse when she misses Spravato .  She does not want any medication changes.  04/03/23 appt noted: Current psych meds: Wellbutrin  XL 150 mg BID and started Auvelity  1 AM, fluvoxamine  100 mg in AM and 300 mg HS, lorazepam  1 mg 1-2 mg in the AM and HS and 1 tablet prn midday for anxiety, temazepam  30 mg HS Patient received Spravato  84 mg today.  She tolerated it well  without any unusual headache, nausea or vomiting or other somatic symptoms.  Dissociation did occur and she gradually saw resolution over the 2-hour period of observation.  She does not typically find the dissociation very strong.  It gets rid of negative emotion for awhile after procedure and would like it to last longer.   No SE complaints with meds.  Doesn't really want med changes.   Planning to weekly Spravato .  It is helping dep and anxiety.    04/10/23 appt noted: Current psych meds: Wellbutrin  XL 150 mg BID and started Auvelity  1 AM, fluvoxamine  100 mg in AM and 300 mg HS, lorazepam  1 mg 1-2 mg in the AM and HS and 1 tablet prn midday for anxiety, temazepam  30 mg HS Patient received Spravato  84 mg today.  She tolerated it well without any unusual headache,  nausea or vomiting or other somatic symptoms.  Dissociation did occur and she gradually saw resolution over the 2-hour period of observation.  She does not typically find the dissociation very strong.  It gets rid of negative emotion for awhile after procedure and would like it to last longer.   No SE complaints with meds.  Doesn't really want med changes.   Had lidocaine  shot for back pain with brief benefit.  Doing some water based PT Spravato  went well today. Got pretty upset last week with event related to son's activities.  Got upset with son saying something inappropriate publicly.  Was so embarrassed.  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    He's kind of rebellious lately.  Son is 6 yo.    05/03/23 appt noted: Current psych meds: Wellbutrin  XL 150 mg BID and started Auvelity  1 AM, fluvoxamine  100 mg in AM and 300 mg HS, lorazepam  1 mg 1-2 mg in the AM and HS and 1 tablet prn midday for anxiety, temazepam  30 mg HS No SE Patient received Spravato  84 mg today.  She tolerated it well without any unusual headache, nausea or vomiting or other somatic symptoms.  Dissociation did occur and she  gradually saw resolution over the 2-hour period of observation.  She does not typically find the dissociation very strong.  It gets rid of negative emotion for awhile after procedure and would like it to last longer.   Still pleased with benefit from Spravato  for mood and anxiety. No SE complaints with meds.  Doesn't really want med changes.   Chronic family px ongoing affects her but nothing she can change.H sick of the family drama.   Continuing counseling helps.  Has some support. Mood and anxiety pretty steady but did go on vacation to [place].  Anxiety worse first in AM and then later at night with mind racing on stressors.   Still back trouble.  05/23/23 appt noted: Current psych meds: Wellbutrin  XL 150 mg BID and started Auvelity  1 AM, fluvoxamine  100 mg in AM and 300 mg HS, lorazepam  1 mg 1-2 mg in the AM and HS and 1 tablet prn midday for anxiety, temazepam  30 mg HS No SE Patient received Spravato  84 mg today.  She tolerated it well without any unusual headache, nausea or vomiting or other somatic symptoms.  Dissociation did occur and she gradually saw resolution over the 2-hour period of observation.  She does not typically find the dissociation very strong.  It gets rid of negative emotion for awhile after procedure and would like it to last longer.   Still pleased with benefit from Spravato  for mood and anxiety by 50%. No SE complaints with meds. Chronic family stress interferes with mental health and self care.  May have to reduce frequency of Spravato  bc of this. But is status quo with meds.  Chronic residual OCD which is mod severe and dep moderate.  05/30/23 appt noted: Current psych meds: Wellbutrin  XL 150 mg BID and started Auvelity  1 AM, fluvoxamine  100 mg in AM and 300 mg HS, lorazepam  1 mg 1-2 mg in the AM and HS and 1 tablet prn midday for anxiety, temazepam  30 mg HS No SE Patient received Spravato  84 mg today.  She tolerated it well without any unusual headache, nausea or  vomiting or other somatic symptoms.  Dissociation did occur and she gradually saw resolution over the 2-hour period of observation.  She  does not typically find the dissociation very strong.  It gets rid of negative emotion for awhile after procedure and would like it to last longer.   Still pleased with benefit from Spravato  for mood and anxiety by 50% or better but not resolved. She wants to continue Spravato  as frequently as schedule will allow. Both depression and OCD are better with most improvement in mood.  Asks about any new treatments for OCD. No SE complaints with meds. Chronic family stress interferes with mental health and self care.  This is an ongoing drain on her mood and resources emotionally.    06/06/23 appt noted: Current psych meds: Wellbutrin  XL 150 mg BID and started Auvelity  1 AM, fluvoxamine  100 mg in AM and 300 mg HS, lorazepam  1 mg 1-2 mg in the AM and HS and 1 tablet prn midday for anxiety, temazepam  30 mg HS No SE Patient received Spravato  84 mg today.  She tolerated it well without any unusual headache, nausea or vomiting or other somatic symptoms.  Dissociation did occur and she gradually saw resolution over the 2-hour period of observation.  She does not typically find the dissociation very strong.  It gets rid of negative emotion for awhile after procedure and would like it to last longer.   Still believes each meds work and are helpful and well tolerated.  Specifically she thinks that the Auvelity  adds additional benefit on taking Wellbutrin .  She believes the meds help both depression and OCD though the OCD remains a problem.  She also believes Spravato  helps both types of symptoms as well.  Her mood is variable.  She experiences a great deal of stress from her family and those problems wax and wane.  She has bad days because of it and today is 1 of those days.  She is continuing counseling and that is helpful.  Due to family demands she may have to spread out the Spravato   frequency.  06/16/23 appt noted:  Current psych meds: Wellbutrin  XL 150 mg BID and started Auvelity  1 AM, fluvoxamine  100 mg in AM and 300 mg HS, lorazepam  1 mg 1-2 mg in the AM and HS and 1 tablet prn midday for anxiety, temazepam  30 mg HS No SE Patient received Spravato  84 mg today.  She tolerated it well without any unusual headache, nausea or vomiting or other somatic symptoms.  Dissociation did occur and she gradually saw resolution over the 2-hour period of observation.  She does not typically find the dissociation very strong.  It gets rid of negative emotion for awhile after procedure . She is trying to make the Schedule work for Spravto oh every week or every other week because she is aware the treatment effects can be lost if it is time less frequently.  So she has been able to come the last 2 weeks, we will try to come in 2 weeks.  Spravato  reduces depression and OCD significantly though she remains highly symptomatic with both.  However she has no suicidal thoughts.  Crying spells are reduced with Spravato .  She still spends a fair amount of time meaning over an hour a day checking and more under stress or when tired.  She asks about any new meds for OCD. Plan: increase Auvelity  1 in AM & PM Hold Wellbutrin  XL 150 mg BID  07/12/23 appt noted: Current psych meds: Wellbutrin  XL 150 mg BID and started Auvelity  1 AM, fluvoxamine  100 mg in AM and 300 mg HS, lorazepam  1 mg 1-2 mg  in the AM and HS and 1 tablet prn midday for anxiety, temazepam  30 mg HS.  Didn't increase Auvelity  yet No SE Patient received Spravato  84 mg today.  She tolerated it well without any unusual headache, nausea or vomiting or other somatic symptoms.  Dissociation did occur and she gradually saw resolution over the 2-hour period of observation.  She does not typically find the dissociation very strong.  It gets rid of negative emotion for awhile after procedure . She has not increased the Auvelity  yet is planned.  She has a  little bit of nervousness about it but admits is not rational.  She is willing to give that a try to try to get better control of depression and anxiety.  She continues to see the benefit of Spravato  every other week.  She is struggling with some pain due to plantar fasciitis and given her cerebral palsy that makes it even more difficult to walk and to engage in normal activities.  She is willing to seek treatment for that.  07/25/23 appt noted: Current psych meds: Wellbutrin  XL 150 mg AM and started Auvelity  1 BID, fluvoxamine  100 mg in AM and 300 mg HS, lorazepam  1 mg 1-2 mg in the AM and HS and 1 tablet prn midday for anxiety, temazepam  30 mg HS.  Didn't increase Auvelity  yet No SE Patient received Spravato  84 mg today.  She tolerated it well without any unusual headache, nausea or vomiting or other somatic symptoms.  Dissociation did occur and she gradually saw resolution over the 2-hour period of observation.  She does not typically find the dissociation very strong.  It gets rid of negative emotion for awhile after procedure . Chronic dep and anxiety to some extent.  But has increased Auvelity  to BID recently and no SE yet.  Disc frequency Spravato  bc benefit and will try to continue every other week and not less if possible. No further concerns for meds.  08/08/23 appt noted: Current psych meds: Wellbutrin  XL 150 mg AM and Auvelity  1 BID, fluvoxamine  100 mg in AM and 300 mg HS, lorazepam  1 mg 1-2 mg in the AM and HS and 1 tablet prn midday for anxiety, temazepam  30 mg HS.  Didn't increase Auvelity  yet No SE with med changes. Patient received Spravato  84 mg today.  She tolerated it well without any unusual headache, nausea or vomiting or other somatic symptoms.  Dissociation did occur and she gradually saw resolution over the 2-hour period of observation.  She does not typically find the dissociation very strong.  It gets rid of negative emotion for awhile after procedure . Mornings still worse  with dep and anxiety.  But mood and anxiety are better with the increase in Auvelity  to BID.  OCD seems a little better.  Chronic difficulty handling the complaints and stress from her family.  Easily stresser her out.  No med changes desire. She will have to take a break from Spravato  DT pending foot surgery.  Mobility and transportation will be limited.  08/25/23 appt noted: Current psych meds: Wellbutrin  XL 150 mg AM and Auvelity  1 BID, fluvoxamine  100 mg in AM and 300 mg HS, lorazepam  1 mg 1-2 mg in the AM and HS and 1 tablet prn midday for anxiety, temazepam  30 mg HS.  Didn't increase Auvelity  yet No SE with med changes. Patient received Spravato  84 mg today.  She tolerated it well without any unusual headache, nausea or vomiting or other somatic symptoms.  Dissociation did occur and  she gradually saw resolution over the 2-hour period of observation.  She does not typically find the dissociation very strong.  It gets rid of negative emotion for awhile after procedure . Pending foot surgery next week.  Able to work in Spravato  this week which helps with dep and anxiety and OCD.  Struggles with sleep without multiple meds as noted.  No adverse effects.  Will return to Spravato  asap after foot surgery  10/04/23 appt noted: Current psych meds: Wellbutrin  XL 150 mg AM and Auvelity  1 BID, fluvoxamine  100 mg in AM and 300 mg HS, lorazepam  1 mg 1-2 mg in the AM and HS and 1 tablet prn midday for anxiety, temazepam  30 mg HS.  Didn't increase Auvelity  yet No SE with med changes. Patient received Spravato  84 mg today.  She tolerated it well without any unusual headache, nausea or vomiting or other somatic symptoms.  Dissociation did occur and she gradually saw resolution over the 2-hour period of observation.  She does not typically find the dissociation very strong.  It gets rid of negative emotion for awhile after procedure . Mood and anxiety stable.  Benefit with meds and Spravato .   She reports OCD time  consumption typically is much better than in the past when it was routinely 2 hours daily.  Now less than have of that.    Chronic caregiver stress with dysfunctional family and has trouble with boundaries.  Working on that in therapy.  10/24/23 appt noted: Current psych meds: Wellbutrin  XL 150 mg AM and Auvelity  1 BID, fluvoxamine  100 mg in AM and 300 mg HS, lorazepam  1 mg 1-2 mg in the AM and HS and 1 tablet prn midday for anxiety, temazepam  30 mg HS.  No SE with med changes. She plans to stop Spravato  bc couldn't get adequate transportation.  Her mood is continuing to be highly reactive with what's going on with family esp mother and brother.  Mother self absorbed and can be mean yet demanding.   She doesn't fear getting worse if she stops Spravato  though this has happened in the past.   Lately less dep though can be pulled down quickly by family of origin interactions.  Narcissistic mother.  Thinking of PT work to try to keep her mind more occupied. Plan no changes  01/15/24 appt noted: Current psych meds: Wellbutrin  XL 150 mg AM and Auvelity  1 BID, fluvoxamine  100 mg in AM and 300 mg HS, lorazepam  1 mg 1-2 mg in the AM and HS and 1 tablet prn midday for anxiety, temazepam  30 mg HS. No SE with med changes. Patient received Spravato  84 mg today.  She tolerated it well without any unusual headache, nausea or vomiting or other somatic symptoms.  Dissociation did occur and she gradually saw resolution over the 2-hour period of observation.  She does not typically find the dissociation very strong.  It gets rid of negative emotion for awhile after procedure . She wanted to resume Spravato  bc 30% benefit for dep and anxiety.  Chronic struggles with OCD and family stress and handles it better when on Spravato . No problems with meds and no desire for changes.  01/16/24 appt noted: Current psych meds: Wellbutrin  XL 150 mg AM and Auvelity  1 BID, fluvoxamine  100 mg in AM and 300 mg HS, lorazepam  1 mg 1-2 mg  in the AM and HS and 1 tablet prn midday for anxiety, temazepam  30 mg HS. No SE with med changes. Patient received Spravato  84 mg today.  She  tolerated it well without any unusual headache, nausea or vomiting or other somatic symptoms.  Dissociation did occur and she gradually saw resolution over the 2-hour period of observation.  She does not typically find the dissociation very strong.  It gets rid of negative emotion for awhile after procedure . She wanted to resume Spravato  bc 30% benefit for dep and anxiety.  Chronic struggles with OCD and family stress and handles it better when on Spravato . Asks about dropping out of the Wellbutrin .  01/23/24 appt noted: Current psych meds: Wellbutrin  XL 150 mg AM and Auvelity  1 BID, fluvoxamine  100 mg in AM and 300 mg HS, lorazepam  1 mg 1-2 mg in the AM and HS and 1 tablet prn midday for anxiety, temazepam  30 mg HS. No SE with med changes. Patient received Spravato  84 mg today.  She tolerated it well without any unusual headache, nausea or vomiting or other somatic symptoms.  Dissociation did occur and she gradually saw resolution over the 2-hour period of observation.  She does not typically find the dissociation very strong.  It gets rid of negative emotion for awhile after procedure . She wanted to resume Spravato  bc 30% benefit for dep and anxiety.  Chronic struggles with OCD and family stress and handles it better when on Spravato . She is getting benefit from the Spravato  for depression and anxiety Plan no med changes  02/13/24 appt noted: Current psych meds: Wellbutrin  XL 150 mg AM and Auvelity  1 BID, fluvoxamine  100 mg in AM and 300 mg HS, lorazepam  1 mg 1-2 mg in the AM and HS and 1 tablet prn midday for anxiety, temazepam  30 mg HS. No SE with med changes. Patient received Spravato  84 mg today.  She tolerated it well without any unusual headache, nausea or vomiting or other somatic symptoms.  Dissociation did occur and she gradually saw resolution over  the 2-hour period of observation.  She does not typically find the dissociation very strong.  It gets rid of negative emotion for awhile after procedure . She continues to note considerable improvement in depression and anxiety when she misses provide a treatment on an every 2-week basis which is much as she can arrange given her transportation issues.  She is chronically stressed by her dysfunctional family of origin.  She recognizes she does not handle distressed easily.  She states benefit from the medication and does not want any changes  02/27/24 appt noted:  Current psych meds: Wellbutrin  XL 150 mg AM and Auvelity  1 BID, fluvoxamine  100 mg in AM and 300 mg HS, lorazepam  1 mg 1-2 mg in the AM and HS and 1 tablet prn midday for anxiety, temazepam  30 mg HS. No SE nor desire for med changes. Patient received Spravato  84 mg today.  She tolerated it well without any unusual headache, nausea or vomiting or other somatic symptoms.  Dissociation did occur and she gradually saw resolution over the 2-hour period of observation.  She does not typically find the dissociation very strong.  It gets rid of negative emotion for awhile after procedure . Able to leave without assistance. Benefit Spravato  for mood and OCD and wants to continue.  No desire for med change.  Ongoing stressors with family.  OCD is chronic.    03/12/2024 appointment noted: Current psych meds: Wellbutrin  XL 150 mg AM and Auvelity  1 BID, fluvoxamine  100 mg in AM and 300 mg HS, lorazepam  1 mg 1-2 mg in the AM and HS and 1 tablet prn midday for anxiety,  temazepam  30 mg HS. No SE nor desire for med changes. Patient received Spravato  84 mg today.  She tolerated it well without any unusual headache, nausea or vomiting or other somatic symptoms.  Dissociation did occur and she gradually saw resolution over the 2-hour period of observation.  She does not typically find the dissociation very strong.  No med changes desired. Continues to receive  benefit from Spravato  and reducing depression and helping to moderate the OCD.  Specifically she is 50% less depressed and has about 25 to 30% less OCD using Spravato .  She is chronically stressed with a dysfunctional family.  She is working on boundary issues with her therapist.  03/26/24 appt noted: Current psych meds: Wellbutrin  XL 150 mg AM and Auvelity  1 BID, fluvoxamine  100 mg in AM and 300 mg HS, lorazepam  1 mg 1-2 mg in the AM and HS and 1 tablet prn midday for anxiety, temazepam  30 mg HS. No SE nor desire for med changes. Patient received Spravato  84 mg today.  She tolerated it well without any unusual headache, nausea or vomiting or other somatic symptoms.  Dissociation did occur and she gradually saw resolution over the 2-hour period of observation.  She does not typically find the dissociation very strong.  No med changes desired.  03/26/24 appt noted: Current psych meds: Wellbutrin  XL 150 mg AM and Auvelity  1 BID, fluvoxamine  100 mg in AM and 300 mg HS, lorazepam  1 mg 1-2 mg in the AM and HS and 1 tablet prn midday for anxiety, temazepam  30 mg HS. No SE nor desire for med changes. Patient received Spravato  84 mg today.  She tolerated it well without any unusual headache, nausea or vomiting or other somatic symptoms.  Dissociation did occur and she gradually saw resolution over the 2-hour period of observation.  She does not typically find the dissociation very strong.   Missed a couple of days of fluvoxamine  and had relapse worsening OCD.  It had been more under control for weeks priot to this past weekend.  Also struggling to keep up Spravato  which is helpful for OCD and dep.  Schedule px are ongoing.  06/03/24 appt noted:  Current psych meds: Wellbutrin  XL 150 mg AM and Auvelity  1 BID, fluvoxamine  100 mg in AM and 300 mg HS, lorazepam  1 mg 1-2 mg in the AM and HS and 1 tablet prn midday for anxiety, temazepam  30 mg HS. No SE nor desire for med changes. She has put a hold on Spravato  for  now due to transportation problems. Most days took all 5 lorazepam .  One day forgot. Has been thinking about resuming Spravato  back to every other week.  Situation with family is the same.  It's really hard.  When on Spravato  gets more relief.  Otherwise tends to dwell on catastrophic things.  She was not coming for Spravato  DT transportation issues.  She thinks she needs to try to work it out. Plan: TRIAL POTENTIATION WITH CLOMIPRAMINE  25 MG hs .  Could go higher.   08/08/24 appt noted:  Current psych meds: Wellbutrin  XL 150 mg AM and Auvelity  1 BID, fluvoxamine  100 mg in AM and 300 mg HS, lorazepam  1 mg 1-2 mg in the AM and HS and 1 tablet prn midday for anxiety, temazepam  30 mg HS. No SE nor desire for med changes. Never tried clomipramine  bc fearful of dry mouth which is already a problem.  Things are hard with mother and family.  Life is too hard for me; I'm  not meant for this world.   Son has px bc school problems, cries a lot .  Feel like it is all my fault bc he has autism.  She feels guilty she might have caused his autism.  Son 71 yo.   Still gets drug down in mood by psychosocial stressors.   Plan: TRIAL POTENTIATION WITH CLOMIPRAMINE  25 MG hs .  Could go higher.   10/08/24 appt oted: Med: Current psych meds: Wellbutrin  XL 150 mg AM and Auvelity  1 BID but skipping second, fluvoxamine  100 mg in AM and 300 mg HS, lorazepam  1 mg 1-2 mg in the AM and HS and 1 tablet prn midday for anxiety, temazepam  30 mg HS.  Never took clomipramine  More dep with less Auvelity  which she skipped DT not likeing to take so many pills. She agrees to go back up in the dose.  Tough 3 weeks.  Not ready for Christmas.   Stress with 15 yo autistic son.   He doesn't like to talk.  Isolates and doesn't want to go anywhere.  Makes me sad. More dep.  Previous psych med trials include  Prozac, paroxetine, sertraline, fluvoxamine , venlafaxine, Anafranil  with no response,  Wellbutrin , Viibryd, Trintellix 10 1 month  NR Auvelity  BID   Geodon,  risperidone, Rexulti, Abilify,  Seroquel, Latuda 40 mg with irritability.   lamotrigine lithium,  BuSpar, Namenda,   pramipexole with no response ? Dose Topamax, pindolol  2 brothers & 1 sister poverty, 1 B with untreated bipolar disorder lives with mother  ECT-MADRS    Flowsheet Row Office Visit from 06/29/2021 in [facility]  MADRS Total Score 36   Flowsheet Row ED from 08/28/2024 in [facility] Emergency Department at [facility] Admission (Discharged) from 06/11/2021 in [place] [facility]  C-SSRS RISK CATEGORY No Risk No Risk    Review of Systems:  Review of Systems  Constitutional:  Positive for fatigue.  Respiratory:  Positive for cough.   Cardiovascular:  Negative for palpitations.  Musculoskeletal:  Positive for arthralgias, back pain, gait problem, myalgias and neck pain.  Neurological:  Positive for weakness and headaches. Negative for tremors.  Psychiatric/Behavioral:  Positive for dysphoric mood and sleep disturbance. Negative for confusion and suicidal ideas. The patient is nervous/anxious.     Medications: I have reviewed the patient's current medications.  Current Outpatient Medications  Medication Sig Dispense Refill   Abaloparatide (TYMLOS) 3120 MCG/1.56ML SOPN Inject into the skin.     AUVELITY  45-105 MG TBCR TAKE 1 TABLET BY MOUTH IN THE MORNING AND IN THE EVENING 60 tablet 1   Azelastine-Fluticasone  137-50 MCG/ACT SUSP Place 1-2 sprays into both nostrils daily.     baclofen  (LIORESAL ) 10 MG tablet Take 20 mg by mouth at bedtime as needed for muscle spasms.     buPROPion  (WELLBUTRIN  XL) 150 MG 24 hr tablet TAKE 1 TABLET (150 MG TOTAL) BY MOUTH IN THE MORNING 30 tablet 2   clomiPRAMINE  (ANAFRANIL ) 25 MG capsule TAKE 1 CAPSULE BY MOUTH AT BEDTIME. 30 capsule 1   dicyclomine (BENTYL) 10 MG capsule Take 10 mg by mouth daily.     docusate sodium  (COLACE) 100 MG capsule Take 1 capsule (100  mg total) by mouth 2 (two) times daily. (Patient taking differently: Take 100 mg by mouth daily.) 10 capsule 0   Esketamine HCl, 84 MG Dose, (SPRAVATO , 84 MG DOSE,) 28 MG/DEVICE SOPK USE 3 SPRAYS IN EACH NOSTRIL ONCE A WEEK (Patient not taking: Reported on 08/08/2024) 3 each  1   fexofenadine (ALLEGRA) 180 MG tablet Take 180 mg by mouth daily.     fluvoxaMINE  (LUVOX ) 100 MG tablet TAKE 1 TABLET IN THE AM AND 3 TABLETS AT NIGHT 120 tablet 1   hydrocortisone  (ANUSOL -HC) 2.5 % rectal cream Place rectally 2 (two) times daily. x 7-14 days 30 g 0   ketotifen  (ZADITOR ) 0.025 % ophthalmic solution Place 3 drops into both eyes at bedtime.     LORazepam  (ATIVAN ) 1 MG tablet TAKE 1-2 TABS EVERY MORNING,1 TAB IN THE AFTERNOON, & 1-2 TAB AT BEDTIME IF NEEDED FOR ANXIETY/SLEEP 150 tablet 1   magnesium gluconate (MAGONATE) 500 MG tablet Take 500 mg by mouth daily.     meloxicam  (MOBIC ) 15 MG tablet Take 1 tablet (15 mg total) by mouth daily. 30 tablet 0   meloxicam  (MOBIC ) 15 MG tablet TAKE 1 TABLET (15 MG TOTAL) BY MOUTH DAILY. 30 tablet 0   mesalamine  (CANASA ) 1000 MG suppository Place 1 suppository (1,000 mg total) rectally 2 (two) times daily. 60 suppository 0   methylPREDNISolone  (MEDROL  DOSEPAK) 4 MG TBPK tablet Take as directed (Patient not taking: Reported on 08/08/2024) 21 each 0   MIBELAS 24 FE 1-20 MG-MCG(24) CHEW Chew 1 tablet by mouth at bedtime as needed (bowel regularity).     Multiple Vitamins-Minerals (ADULT GUMMY PO) Take 2 tablets by mouth in the morning.     nitrofurantoin (MACRODANTIN) 100 MG capsule Take 100 mg by mouth as needed (For urinary tract infection.).      oxyCODONE -acetaminophen  (PERCOCET/ROXICET) 5-325 MG tablet Take 1-2 tablets by mouth every 6 (six) hours as needed for severe pain. (Patient not taking: Reported on 08/08/2024) 50 tablet 0   polyethylene glycol (MIRALAX  / GLYCOLAX ) packet Take 17 g by mouth daily as needed for mild constipation. 14 each 0   propranolol  ER (INDERAL   LA) 60 MG 24 hr capsule TAKE 1 CAPSULE BY MOUTH EVERY DAY 30 capsule 2   psyllium (METAMUCIL) 58.6 % powder Take 1 packet by mouth daily as needed (constipation).     SUMAtriptan  (IMITREX ) 100 MG tablet TAKE 1 TAB EVERY 2 HOURS AS NEEDED FOR MIGRAINE. MAY REPEAT IN 2 HRS IF HEADACHE PERSISTS OR RECURS 9 tablet 3   temazepam  (RESTORIL ) 30 MG capsule Take 1 capsule by mouth at bedtime as needed for sleep 30 capsule 1   Vitamin D-Vitamin K (VITAMIN K2-VITAMIN D3 PO) Take 1-2 sprays by mouth daily.     No current facility-administered medications for this visit.    Medication Side Effects: None   Allergies:  Allergies  Allergen Reactions   Hydrocodone  Itching   Sulfamethoxazole-Trimethoprim Itching   Dust Mite Extract Other (See Comments)    Sneezing, watery eyes, runny nose   Latex Itching   Other Other (See Comments)    PT IS ALLERGIC TO CAT DANDER AND RAGWEED - Sneezing, watery eyes, runny nose    Pollen Extract Other (See Comments)    Sneezing, watery eyes, runny nose     Past Medical History:  Diagnosis Date   Abnormal Pap smear 2011   hpv/mild dysplasia,cin1   Anxiety    Cerebral palsy (HCC)    right arm/leg   Cystocele    Depression    Headache    Neuromuscular disorder (HCC)    Cerebral Palsy   OCD (obsessive compulsive disorder)    Osteoporosis    Uterine prolaps     Family History  Problem Relation Age of Onset   Cancer Father  skin AND LUNG   Alcohol abuse Sister        CRACK COCAINE    Social History   Socioeconomic History   Marital status: Married    Spouse name: Not on file   Number of children: Not on file   Years of education: Not on file   Highest education level: Not on file  Occupational History   Not on file  Tobacco Use   Smoking status: Never   Smokeless tobacco: Never  Substance and Sexual Activity   Alcohol use: Not Currently    Comment: OCCASIONAL beer   Drug use: No   Sexual activity: Yes    Birth control/protection:  Pill    Comment: LOESTRIN 24 FE  Other Topics Concern   Not on file  Social History Narrative   Not on file   Social Drivers of Health   Tobacco Use: Low Risk (10/03/2024)   Patient History    Smoking Tobacco Use: Never    Smokeless Tobacco Use: Never    Passive Exposure: Not on file  Recent Concern: Tobacco Use - Medium Risk (08/27/2024)   Received from [facility]   Patient History    Smoking Tobacco Use: Former    Smokeless Tobacco Use: Former    Passive Exposure: Not on Stage Manager: Not on [employer] Insecurity: Not on file  Transportation Needs: Not on file  Physical Activity: Not on file  Stress: Not on file  Social Connections: Not on file  Intimate Partner Violence: Not on file  Depression (PHQ2-9): Not on file  Alcohol Screen: Not on file  Housing: Unknown (06/24/2024)   Received from [facility]   Epic    Unable to Pay for Housing in the Last Year: Not on file    Number of Times Moved in the Last Year: Not on file    At any time in the past 12 months, were you homeless or living in a shelter (including now)?: No  Utilities: Not on file  Health Literacy: Not on file    Past Medical History, Surgical history, Social history, and Family history were reviewed and updated as appropriate.   Please see review of systems for further details on the patient's review from today.   Objective:   Physical Exam:  LMP  (LMP Unknown)   Physical Exam Constitutional:      General: She is not in acute distress. Neurological:     Mental Status: She is alert and oriented to person, place, and time.     Cranial Nerves: No dysarthria.     Motor: Weakness present.     Gait: Gait abnormal.  Psychiatric:        Attention and Perception: Attention and perception normal.        Mood and Affect: Mood is anxious and depressed. Affect is tearful. Affect is not labile.        Speech: Speech normal. Speech is not rapid and pressured or slurred.         Behavior: Behavior normal. Behavior is cooperative.        Thought Content: Thought content normal. Thought content is not delusional. Thought content does not include homicidal or suicidal ideation. Thought content does not include suicidal plan.        Cognition and Memory: Cognition and memory normal. Cognition is not impaired.        Judgment: Judgment normal.     Comments: Insight intact Ongoing OCD remains fairly  severe but less anxious Checking compulsions now about 30 mins.   Chronic depression persistent and also worsened by stressors and noncompliance with Auvelity        Lab Review:     Component Value Date/Time   NA 128 (L) 08/28/2024 0932   K 4.0 08/28/2024 0932   CL 97 (L) 08/28/2024 0932   CO2 22 08/28/2024 0932   GLUCOSE 95 08/28/2024 0932   BUN 14 08/28/2024 0932   CREATININE 0.76 08/28/2024 0932   CALCIUM 9.9 08/28/2024 0932   PROT 7.1 08/28/2024 0932   ALBUMIN  3.9 08/28/2024 0932   AST 40 08/28/2024 0932   ALT 39 08/28/2024 0932   ALKPHOS 107 08/28/2024 0932   BILITOT 0.6 08/28/2024 0932   GFRNONAA >60 08/28/2024 0932   GFRAA >60 07/09/2016 0438       Component Value Date/Time   WBC 5.4 08/28/2024 0932   RBC 4.34 08/28/2024 0932   HGB 13.3 08/28/2024 0932   HCT 38.4 08/28/2024 0932   PLT 237 08/28/2024 0932   MCV 88.5 08/28/2024 0932   MCH 30.6 08/28/2024 0932   MCHC 34.6 08/28/2024 0932   RDW 14.6 08/28/2024 0932   LYMPHSABS 1.9 06/11/2021 0606   MONOABS 0.5 06/11/2021 0606   EOSABS 0.1 06/11/2021 0606   BASOSABS 0.0 06/11/2021 0606    No results found for: POCLITH, LITHIUM   No results found for: PHENYTOIN, PHENOBARB, VALPROATE, CBMZ   .res Assessment: Plan:    There are no diagnoses linked to this encounter.   Chronic enmeshment with mother is ongoing problem.  30 min session:   Both primary Dx of OCD and major depression are TR and marked.   She was receiving Spravato  84 mg weekly and moderate improvement in  the depression..  she feels it also helps OCD somewhat.  However still easily overwhelmed with low stress tolerance.  Family contributes to her anxiety and stress markedly and triggers mood changes Needed to stop Spravato .  The OCD is improved with the increase in fluvoxamine  and with Spravato .  Was Spending up to 2 hours daily and checking compulsions on her worst days but better when she travels and also reduced to about 30 min when on Spravato . .  No new options for tx are evident.  She has been on higher doses of fluvoxamine  above the usual max of 400 mg daily in the past and finds it more helpful at the higher dose.    Disc SE.   She is tolerating the meds well  Continue  Luvox  back to 400 mg nightly as of January 2023. Disc dosing higher than usual.  She feels this is increase has helped more with OCD which remains chronically severe.  Disc even higher than usual dosing.   She had worsening sx after missing a couple of days out of town dosing.   Disc SE in detail and SSRI withdrawal sx.  Continue Auvelity  BID and reduced Wellbutrin  to 1 AM. It has helped and is tolerated.   We have discussed seizure risk that is possible using this combination but given severity of her symptoms she feels the risk is warranted.  Do not consider stopping this until she gets the full benefit of Spravato .  Disc dosing again. There are few other alternative medication options that remain.   Disc DDI issues.   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 She doesn't want to try this now. Consider retrial pramipexole off label for dep at higher dosing  based on recent study.  It has not been used at higher doses currently recommended.  We discussed the short-term risks associated with benzodiazepines including sedation and increased fall risk among others.  Discussed long-term side effect risk including dependence, potential withdrawal symptoms, and the potential eventual dose-related risk  of dementia.  But recent studies from 2020 dispute this association between benzodiazepines and dementia risk. Newer studies in 2020 do not support an association with dementia. Disc this is high dose and not ideal.  Also disc risk combining it with temazepam . Rec try gradually reduce HS lorazepam  to 1 mg Hs.  Can continue lorazepam  2 mg AM and 1 mg in afternoon bc of chronic anxiety and it is helpful and tolerated. She can continue temazepam  30 mg nightly.  She tends to have a lot of anxious negative thoughts at night when she is trying to go to bed.  She is trying to reduce the dose.  Complaining of HA and history migraine.  Asks for increase imitrex  and disc preventatives like propranolol  ER imitrex  to 100 mg prn migraine and propranolol  ER for migraine prevention.  CBT around cog distortions of internalizing the problems of other people.  Chronic enmeshment with mother is ongoing problem.Tends to think the worst.   Confronted this clearly.  Boundaries with mother.  Highly reactive emotionally to family issues worse when dep.  Disc this and now reactive over son's autism.  Disc noncompliance intermittently a problem including now.  Resume Auvelity  1 in AM & PM bc dep worse with less. continue Wellbutrin  XL 150 mg AM Fluvoxamine  100 mg AM and 300 mg PM above usual max bc med necessary & tolerated. Lorazepam  2 mg HS and prn 1 mg BID prn anxiety daily Temazepam  30 mg HS Propranolol  ER 60 mg daily Imitrex  prn migraine.  She wants to increase #12/month   Defer TRIAL POTENTIATION WITH CLOMIPRAMINE  25 MG hs .  Could go higher.  She didn't take bc prolapsed rectum requiring surgery and fear of constipation.  FU 1-2 mos   Lorene Macintosh, MD, DFAPA  Please see After Visit Summary for patient specific instructions.  Future Appointments  Date Time Provider Department Center  10/08/2024 11:45 AM Campbell Louana POUR, PT [facility]  10/15/2024 11:45 AM Campbell Louana POUR, PT [facility]   10/16/2024  1:00 PM Marijean Charleston, PhD [facility] None  10/24/2024 11:00 AM Campbell Louana POUR, PT [facility]  11/14/2024  1:00 PM Marijean Charleston, PhD CP-CP None  12/12/2024  1:00 PM Marijean Charleston, PhD CP-CP None    No orders of the defined types were placed in this encounter.    -------------------------------

## 2024-10-12 ENCOUNTER — Other Ambulatory Visit: Payer: Self-pay | Admitting: Psychiatry

## 2024-10-12 DIAGNOSIS — F339 Major depressive disorder, recurrent, unspecified: Secondary | ICD-10-CM

## 2024-10-14 ENCOUNTER — Telehealth: Payer: Self-pay | Admitting: Psychiatry

## 2024-10-14 ENCOUNTER — Other Ambulatory Visit: Payer: Self-pay | Admitting: Psychiatry

## 2024-10-14 DIAGNOSIS — F339 Major depressive disorder, recurrent, unspecified: Secondary | ICD-10-CM

## 2024-10-14 DIAGNOSIS — F4010 Social phobia, unspecified: Secondary | ICD-10-CM

## 2024-10-14 DIAGNOSIS — F5105 Insomnia due to other mental disorder: Secondary | ICD-10-CM

## 2024-10-14 DIAGNOSIS — F422 Mixed obsessional thoughts and acts: Secondary | ICD-10-CM

## 2024-10-14 NOTE — Telephone Encounter (Signed)
 Patient called in for refill on Auvelity  45-105mg . PH: [phone] Appt 2/19 Pharmacy [facility] [address] [place], [place]

## 2024-10-14 NOTE — Telephone Encounter (Signed)
 RF sent 12/28.

## 2024-10-15 ENCOUNTER — Ambulatory Visit: Admitting: Physical Therapy

## 2024-10-16 ENCOUNTER — Ambulatory Visit: Admitting: Psychiatry

## 2024-10-16 DIAGNOSIS — F4010 Social phobia, unspecified: Secondary | ICD-10-CM | POA: Diagnosis not present

## 2024-10-16 DIAGNOSIS — F339 Major depressive disorder, recurrent, unspecified: Secondary | ICD-10-CM

## 2024-10-16 DIAGNOSIS — Z638 Other specified problems related to primary support group: Secondary | ICD-10-CM | POA: Diagnosis not present

## 2024-10-16 DIAGNOSIS — F422 Mixed obsessional thoughts and acts: Secondary | ICD-10-CM | POA: Diagnosis not present

## 2024-10-16 DIAGNOSIS — K623 Rectal prolapse: Secondary | ICD-10-CM | POA: Diagnosis not present
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
## 2024-10-20 ENCOUNTER — Other Ambulatory Visit: Payer: Self-pay | Admitting: Psychiatry

## 2024-10-20 DIAGNOSIS — F422 Mixed obsessional thoughts and acts: Secondary | ICD-10-CM

## 2024-10-23 ENCOUNTER — Other Ambulatory Visit: Payer: Self-pay

## 2024-10-23 ENCOUNTER — Ambulatory Visit: Payer: Self-pay | Admitting: Surgery

## 2024-10-23 ENCOUNTER — Encounter (HOSPITAL_COMMUNITY): Payer: Self-pay

## 2024-10-23 ENCOUNTER — Encounter (HOSPITAL_COMMUNITY)
Admission: RE | Admit: 2024-10-23 | Discharge: 2024-10-23 | Disposition: A | Discharge status: Home / Self Care | Source: Ambulatory Visit | Attending: Surgery | Admitting: Surgery

## 2024-10-23 VITALS — BP 116/82 | HR 69 | Temp 98.0°F | Resp 16 | Ht 67.0 in | Wt 173.0 lb

## 2024-10-23 DIAGNOSIS — G809 Cerebral palsy, unspecified: Secondary | ICD-10-CM | POA: Diagnosis present

## 2024-10-23 DIAGNOSIS — Z01818 Encounter for other preprocedural examination: Secondary | ICD-10-CM

## 2024-10-23 DIAGNOSIS — Z01812 Encounter for preprocedural laboratory examination: Secondary | ICD-10-CM | POA: Insufficient documentation

## 2024-10-23 HISTORY — DX: Malignant (primary) neoplasm, unspecified: C80.1

## 2024-10-23 LAB — CBC WITH DIFFERENTIAL/PLATELET
Abs Immature Granulocytes: 0.03 K/uL (ref 0.00–0.07)
Basophils Absolute: 0 K/uL (ref 0.0–0.1)
Basophils Relative: 1 %
Eosinophils Absolute: 0.1 K/uL (ref 0.0–0.5)
Eosinophils Relative: 2 %
HCT: 44.3 % (ref 36.0–46.0)
Hemoglobin: 14.9 g/dL (ref 12.0–15.0)
Immature Granulocytes: 1 %
Lymphocytes Relative: 26 %
Lymphs Abs: 1.4 K/uL (ref 0.7–4.0)
MCH: 31.4 pg (ref 26.0–34.0)
MCHC: 33.6 g/dL (ref 30.0–36.0)
MCV: 93.5 fL (ref 80.0–100.0)
Monocytes Absolute: 0.5 K/uL (ref 0.1–1.0)
Monocytes Relative: 9 %
Neutro Abs: 3.4 K/uL (ref 1.7–7.7)
Neutrophils Relative %: 61 %
Platelets: 281 K/uL (ref 150–400)
RBC: 4.74 MIL/uL (ref 3.87–5.11)
RDW: 14.6 % (ref 11.5–15.5)
WBC: 5.4 K/uL (ref 4.0–10.5)
nRBC: 0 % (ref 0.0–0.2)

## 2024-10-23 LAB — COMPREHENSIVE METABOLIC PANEL WITH GFR
ALT: 32 U/L (ref 0–44)
AST: 40 U/L (ref 15–41)
Albumin: 4.3 g/dL (ref 3.5–5.0)
Alkaline Phosphatase: 115 U/L (ref 38–126)
Anion gap: 11 (ref 5–15)
BUN: 19 mg/dL (ref 6–20)
CO2: 20 mmol/L — ABNORMAL LOW (ref 22–32)
Calcium: 10.1 mg/dL (ref 8.9–10.3)
Chloride: 104 mmol/L (ref 98–111)
Creatinine, Ser: 0.81 mg/dL (ref 0.44–1.00)
GFR, Estimated: >60 mL/min
Glucose, Bld: 92 mg/dL (ref 70–99)
Potassium: 4.7 mmol/L (ref 3.5–5.1)
Sodium: 135 mmol/L (ref 135–145)
Total Bilirubin: 0.3 mg/dL (ref 0.0–1.2)
Total Protein: 7.5 g/dL (ref 6.5–8.1)

## 2024-10-23 NOTE — Progress Notes (Signed)
 Please place orders for upcoming surgery.

## 2024-10-23 NOTE — Patient Instructions (Addendum)
 SURGICAL WAITING ROOM VISITATION  Patients having surgery or a procedure may have no more than 2 support people in the waiting area - these visitors may rotate.    Children ages 58 and under will not be able to visit patients in [facility] under most circumstances.   Visitors with respiratory illnesses are discouraged from visiting and should remain at home.  If the patient needs to stay at the hospital during part of their recovery, the visitor guidelines for inpatient rooms apply. Pre-op nurse will coordinate an appropriate time for 1 support person to accompany patient in pre-op.  This support person may not rotate.    Please refer to the [facility] website for the visitor guidelines for Inpatients (after your surgery is over and you are in a regular room).    Your procedure is scheduled on: 10/30/24   Report to [facility] Main Entrance    Report to admitting at 9:45 AM   Call this number if you have problems the morning of surgery [phone]   Follow a clear liquid diet the day before surgery.  Water Non-Citrus Juices (without pulp, NO RED-Apple, White grape, White cranberry) Black Coffee (NO MILK/CREAM OR CREAMERS, sugar ok)  Clear Tea (NO MILK/CREAM OR CREAMERS, sugar ok) regular and decaf                             Plain Jell-O (NO RED)                                           Fruit ices (not with fruit pulp, NO RED)                                     Popsicles (NO RED)                                                               Sports drinks like Gatorade (NO RED)               Do not drink liquids :After Midnight.          If you have questions, please contact your surgeons office.   FOLLOW BOWEL PREP AND ANY ADDITIONAL PRE OP INSTRUCTIONS YOU RECEIVED FROM YOUR SURGEON'S OFFICE!!!     Oral Hygiene is also important to reduce your risk of infection.                                    Remember - BRUSH YOUR TEETH THE MORNING OF SURGERY WITH  YOUR REGULAR TOOTHPASTE  DENTURES WILL BE REMOVED PRIOR TO SURGERY PLEASE DO NOT APPLY Poly grip OR ADHESIVES!!!   Stop all vitamins and herbal supplements 7 days before surgery.   Take these medicines the morning of surgery with A SIP OF WATER: Bupropion , Auvelity , Allegra, Lorazepam , Propranolol , Nasal Sprays, Inhalers, Fluvoxamine , Oxybutynin  You may not have any metal on your body including hair pins, jewelry, and body piercing             Do not wear make-up, lotions, powders, perfumes, or deodorant  Do not wear nail polish including gel and S&S, artificial/acrylic nails, or any other type of covering on natural nails including finger and toenails. If you have artificial nails, gel coating, etc. that needs to be removed by a nail salon please have this removed prior to surgery or surgery may need to be canceled/ delayed if the surgeon/ anesthesia feels like they are unable to be safely monitored.   Do not shave  48 hours prior to surgery.    Do not bring valuables to the hospital. [facility] IS NOT             RESPONSIBLE   FOR VALUABLES.   Contacts, glasses, dentures or bridgework may not be worn into surgery.   Bring small overnight bag day of surgery.   DO NOT BRING YOUR HOME MEDICATIONS TO THE HOSPITAL. PHARMACY WILL DISPENSE MEDICATIONS LISTED ON YOUR MEDICATION LIST TO YOU DURING YOUR ADMISSION IN THE HOSPITAL!   Special Instructions: Bring a copy of your healthcare power of attorney and living will documents the day of surgery if you haven't scanned them before.              Please read over the following fact sheets you were given: IF YOU HAVE QUESTIONS ABOUT YOUR PRE-OP INSTRUCTIONS PLEASE CALL [phone].   If you received a COVID test during your pre-op visit  it is requested that you wear a mask when out in public, stay away from anyone that may not be feeling well and notify your surgeon if you develop symptoms. If you test  positive for Covid or have been in contact with anyone that has tested positive in the last 10 days please notify you surgeon.    [facility] - Preparing for Surgery Before surgery, you can play an important role.  Because skin is not sterile, your skin needs to be as free of germs as possible.  You can reduce the number of germs on your skin by washing with CHG (chlorahexidine gluconate) soap before surgery.  CHG is an antiseptic cleaner which kills germs and bonds with the skin to continue killing germs even after washing. Please DO NOT use if you have an allergy to CHG or antibacterial soaps.  If your skin becomes reddened/irritated stop using the CHG and inform your nurse when you arrive at Short Stay. Do not shave (including legs and underarms) for at least 48 hours prior to the first CHG shower.  You may shave your face/neck.  Please follow these instructions carefully:  1.  Shower with CHG Soap the night before surgery ONLY (DO NOT USE THE SOAP THE MORNING OF SURGERY).  2.  If you choose to wash your hair, wash your hair first as usual with your normal  shampoo.  3.  After you shampoo, rinse your hair and body thoroughly to remove the shampoo.                             4.  Use CHG as you would any other liquid soap.  You can apply chg directly to the skin and wash.  Gently with a scrungie or clean washcloth.  5.  Apply the CHG Soap to your body ONLY FROM THE NECK  DOWN.   Do   not use on face/ open                           Wound or open sores. Avoid contact with eyes, ears mouth and   genitals (private parts).                       Wash face,  Genitals (private parts) with your normal soap.             6.  Wash thoroughly, paying special attention to the area where your    surgery  will be performed.  7.  Thoroughly rinse your body with warm water from the neck down.  8.  DO NOT shower/wash with your normal soap after using and rinsing off the CHG Soap.                9.  Pat yourself dry  with a clean towel.            10.  Wear clean pajamas.            11.  Place clean sheets on your bed the night of your first shower and do not  sleep with pets. Day of Surgery : Do not apply any CHG, lotions/deodorants the morning of surgery.  Please wear clean clothes to the hospital/surgery center.  FAILURE TO FOLLOW THESE INSTRUCTIONS MAY RESULT IN THE CANCELLATION OF YOUR SURGERY  PATIENT SIGNATURE_________________________________  NURSE SIGNATURE__________________________________  ________________________________________________________________________

## 2024-10-23 NOTE — Progress Notes (Addendum)
 Date of COVID positive in last 90 days:  PCP - Norleen Jungling, MD Cardiologist -   Chest x-ray - N/A EKG - possible with PCP Stress Test - N/A ECHO - N/A Cardiac Cath - N/A Pacemaker/ICD device last checked:N/A Spinal Cord Stimulator:N/A  Bowel Prep - clear liquids day before  Sleep Study - N/A CPAP -   Fasting Blood Sugar - N/A Checks Blood Sugar _____ times a day  Last dose of GLP1 agonist-  N/A GLP1 instructions:  Do not take after     Last dose of SGLT-2 inhibitors-  N/A SGLT-2 instructions:  Do not take after     Blood Thinner Instructions: N/A Last dose:   Time: Aspirin  Instructions:N/A Last Dose:  Activity level: Can go up a flight of stairs and perform activities of daily living without stopping and without symptoms of chest pain or shortness of breath.   Anesthesia review: N/A  Patient denies shortness of breath, fever, cough and chest pain at PAT appointment  Patient verbalized understanding of instructions that were given to them at the PAT appointment. Patient was also instructed that they will need to review over the PAT instructions again at home before surgery.

## 2024-10-23 NOTE — Therapy (Incomplete)
 " OUTPATIENT PHYSICAL THERAPY NEURO TREATMENT   Patient Name: Casey Diaz MRN: [id number] DOB:1966-06-26, 59 y.o., female Today's Date: 10/23/2024   PCP: Onita Rush, MD  REFERRING PROVIDER: Nick Suzen Bucker, DO   END OF SESSION:      Past Medical History:  Diagnosis Date   Abnormal Pap smear 2011   hpv/mild dysplasia,cin1   Anxiety    Cerebral palsy (HCC)    right arm/leg   Cystocele    Depression    Headache    Neuromuscular disorder (HCC)    Cerebral Palsy   OCD (obsessive compulsive disorder)    Osteoporosis    Uterine prolaps    Past Surgical History:  Procedure Laterality Date   COLPOSCOPY  2011   ELBOW SURGERY     left elbow-separation of bones -age 57   ELBOW SURGERY  2015   fell on ice- right elbow fracture   FRACTURE SURGERY     HAMMER TOE SURGERY  1/13   RIGHT SIDE   HIP PINNING,CANNULATED Left 07/08/2016   Procedure: LEFT CLOSED REDUCTION HIP AND PERCUTANEOUS SCREW;  Surgeon: Donnice Car, MD;  Location: [facility];  Service: Orthopedics;  Laterality: Left;   ORIF HUMERUS FRACTURE Right 06/11/2021   Procedure: OPEN REDUCTION INTERNAL FIXATION (ORIF) DISTAL HUMERUS FRACTURE;  Surgeon: Celena Sharper, MD;  Location: [facility];  Service: Orthopedics;  Laterality: Right;   WRIST SURGERY  2005   left wrist   Patient Active Problem List   Diagnosis Date Noted   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HCC) 08/03/2018   Relationship problem with family member 08/03/2018   Closed fracture of neck of left femur, initial encounter (HCC) 07/08/2016   Severe recurrent major depression without psychotic features (HCC) 06/03/2015    Class: Chronic   Obsessive-compulsive disorder 06/03/2015    Class: Chronic    ONSET DATE: congenital   REFERRING DIAG: 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  Rationale for Evaluation and Treatment: Rehabilitation  SUBJECTIVE:                                                                                                                                                                                             SUBJECTIVE STATEMENT: Had a fall, fell over a basket and hurt the L side of the neck and shoulder. Denies N/T or radiation. Patient also reports really bad migraine currently. Denies dizziness.    Pt accompanied by: self  PERTINENT HISTORY:  CP, anxiety, depression, HA, CPD, osteoporosis, L elbow and wrist surgery, L hip pinning, R humerus ORIF  PAIN:  Are you having pain? Yes: NPRS scale: 8/10 Pain location: L neck  Pain description: sharp Aggravating factors: fall Relieving factors: TENS, chiropractor   PRECAUTIONS: Fall, osteoporosis  RED FLAGS: None   WEIGHT BEARING RESTRICTIONS: No  FALLS: Has patient fallen in last 6 months? Yes. 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 Reports 4/10 pain   DGI  14/24     [facility] PT Assessment - 10/08/24 0001       Standardized Balance Assessment   Standardized Balance Assessment Dynamic Gait Index      Dynamic Gait Index   Level Surface Mild Impairment    Change in Gait Speed Moderate Impairment    Gait with  Horizontal Head Turns Mild Impairment    Gait with Vertical Head Turns Mild Impairment    Gait and Pivot Turn Moderate Impairment    Step Over Obstacle Mild Impairment    Step Around Obstacles Mild Impairment    Steps Mild Impairment    Total Score 14          PATIENT EDUCATION: Education details: advised pt to use handrail for stairs for safety; encourage AD on uneven surfaces d/t fall risk, discussed pt's upcoming appointment  Person educated: Patient Education method: Explanation Education comprehension: verbalized understanding     HOME EXERCISE PROGRAM Last updated: 10/03/24 Access Code: [email or web address] URL: [email or web address] Date: 10/03/2024 Prepared by: [facility]patient  Rehab - [facility]  Exercises - Supine Lower Trunk Rotation  - 1 x daily - 5 x weekly - 2 sets - 10 reps - Supine Pelvic Tilt  - 1 x daily - 5 x weekly - 2 sets - 10 reps - Sidelying Thoracic Rotation with Open Book  - 1 x daily - 5 x weekly - 2 sets - 10 reps - Cervical Retraction with Overpressure  - 1 x daily - 5 x weekly - 2 sets - 10 reps - 3 sec hold   Note: Objective measures were completed at Evaluation unless otherwise noted.  DIAGNOSTIC FINDINGS: none recent  COGNITION: Overall cognitive status: Within functional limits for tasks assessed   SENSATION: Pt denies changes in N/T  COORDINATION: Alternating pronation/supination: motor limitations on R UE Alternating toe tap: motor limitations on R LE Finger to nose: motor limitations on R UE; mild tremor on L   Observation: no swelling or bruising over R knee   MUSCLE TONE: slightly increased extensor tone in R quad; no knee pain with testing    POSTURE: rounded shoulders; head tilted slightly   LOWER EXTREMITY ROM:     Active  Right Eval Left Eval  Hip flexion    Hip extension    Hip  abduction    Hip adduction    Hip internal rotation    Hip external rotation    Knee flexion    Knee extension     Ankle dorsiflexion 3 11  Ankle plantarflexion    Ankle inversion    Ankle eversion     (Blank rows = not tested)  LOWER EXTREMITY MMT:    MMT (in sitting) Right Eval Left Eval  Hip flexion 4 4+  Hip extension    Hip abduction 4- 4+  Hip adduction 4- 4+  Hip internal rotation    Hip external rotation    Knee flexion 4 4  Knee extension 4 5  Ankle dorsiflexion 3+ 5  Ankle plantarflexion 3+ 4+  Ankle inversion    Ankle eversion    (Blank rows = not tested) *no knee pain during testing   GAIT: Findings: Assistive device utilized:None, Level of assistance: Modified independence and SBA, and Comments: R knee adduction, lateral trunk lean, reduced R foot clearance with foot in eversion  FUNCTIONAL TESTS:  5 times sit to stand: 19.23 sec without UE support; retropulsion and limited descending control  10 meter walk test: 14.15 sec (2.32 ft/sec)                                                                                                                                TREATMENT DATE: 09/09/24     PATIENT EDUCATION: Education details: advised pt to make appt with PCP about R knee pain; advised pt that I was not aware of any CP support groups in the area unfortunately; prognosis, POC  Person educated: Patient Education method: Explanation Education comprehension: verbalized understanding  HOME EXERCISE PROGRAM: Not yet initiated   GOALS: Goals reviewed with patient? Yes  SHORT TERM GOALS: Target date: 09/30/2024  Patient to be independent with initial HEP. Baseline: HEP initiated Goal status: INITIAL    LONG TERM GOALS: Target date: 10/28/2024  Patient to be independent with advanced HEP. Baseline: Not yet initiated  Goal status: INITIAL  Patient to recall 3 ways to manage neck and LBP. Baseline: edu not yet initiated Goal status: IN PROGRESS  Patient to demonstrate gait speed of 2.8 ft/sec in order to improve access to community.  Baseline: 2.32  ft/sec Goal status: IN PROGRESS  Patient to score at least 18/24 on DGI in order to decrease risk of falls.  Baseline: 14/24 10/08/24 Goal status: IN PROGRESS 10/08/24  Patient to demonstrate 5xSTS test in <15 sec in order to decrease risk of falls.  Baseline: 19.23 sec without UE support; retropulsion and limited descending control  Goal status: IN PROGRESS  Patient to report understanding of fall prevention education. Baseline: not yet initiated Goal status: IN PROGRESS  ASSESSMENT:  CLINICAL IMPRESSION: Patient arrived to session with report of a fall since last session, resulting in increased L sided neck pain. Patient denies N/T or radiation of pain. Patient responded very well from MT and  reported reduction to 4/10 pain. Patient scored 14/24 on DGI, indicating an increased risk of falls. Discussed use of AD for safety d/t pt's recent falls. Patient tolerated session well and without complaints at end of appointment.   OBJECTIVE IMPAIRMENTS: Abnormal gait, decreased activity tolerance, decreased balance, decreased coordination, decreased knowledge of use of DME, decreased mobility, difficulty walking, decreased ROM, decreased strength, impaired flexibility, impaired tone, postural dysfunction, and pain.   ACTIVITY LIMITATIONS: carrying, lifting, bending, standing, squatting, stairs, transfers, bathing, toileting, dressing, reach over head, hygiene/grooming, locomotion level, and caring for others  PARTICIPATION LIMITATIONS: meal prep, cleaning, laundry, driving, shopping, community activity, yard work, and church  PERSONAL FACTORS: Age, Behavior pattern, Past/current experiences, Time since onset of injury/illness/exacerbation, and 3+ comorbidities: CP, anxiety, depression, HA, CPD, osteoporosis, L elbow and wrist surgery, L hip pinning, R humerus ORIF are also affecting patient's functional outcome.   REHAB POTENTIAL: Good  CLINICAL DECISION MAKING: Evolving/moderate  complexity  EVALUATION COMPLEXITY: Moderate  PLAN:  PT FREQUENCY: 1-2x/week  PT DURATION: 6 weeks  PLANNED INTERVENTIONS: 97164- PT Re-evaluation, 97110-Therapeutic exercises, 97530- Therapeutic activity, 97112- Neuromuscular re-education, 97535- Self Care, 02859- Manual therapy, [phone]- Gait training, [phone]- Canalith repositioning, [id number]- Electrical stimulation (manual), [phone] (1-2 muscles), 20561 (3+ muscles)- Dry Needling, Patient/Family education, Balance training, Stair training, Taping, Joint mobilization, Spinal mobilization, Vestibular training, DME instructions, Cryotherapy, and Moist heat  PLAN FOR NEXT SESSION: trial trekking pole vs. Cane, review and progress HEP to work on balance, LE and core strength, lumbar mobility, cervical stability    Louana Terrilyn Christians, PT, DPT 10/23/2024 8:10 AM  [facility] [address] [place], [place] [address] Phone # [phone] Fax # [phone]         "

## 2024-10-24 ENCOUNTER — Ambulatory Visit: Attending: Physical Medicine and Rehabilitation | Admitting: Physical Therapy

## 2024-10-24 ENCOUNTER — Encounter: Payer: Self-pay | Admitting: Physical Therapy

## 2024-10-24 DIAGNOSIS — M542 Cervicalgia: Secondary | ICD-10-CM | POA: Diagnosis present

## 2024-10-24 DIAGNOSIS — R2681 Unsteadiness on feet: Secondary | ICD-10-CM | POA: Insufficient documentation

## 2024-10-24 DIAGNOSIS — R2689 Other abnormalities of gait and mobility: Secondary | ICD-10-CM | POA: Diagnosis present

## 2024-10-24 NOTE — Therapy (Signed)
 " OUTPATIENT PHYSICAL THERAPY NEURO TREATMENT   Patient Name: Casey Diaz MRN: [id number] DOB:1966-03-08, 59 y.o., female Today's Date: 10/24/2024   PCP: Onita Rush, MD  REFERRING PROVIDER: Nick Suzen Bucker, DO   END OF SESSION:  PT End of Session - 10/24/24 1109     Visit Number 4    Number of Visits 13    Date for Recertification  10/28/24    Authorization Type [employer]    Authorization - Visit Number 1   2026   Authorization - Number of Visits 100    PT Start Time 1106   arrives late   PT Stop Time 1147    PT Time Calculation (min) 41 min    Equipment Utilized During Treatment Gait belt    Activity Tolerance Patient tolerated treatment well    Behavior During Therapy [facility] for tasks assessed/performed             Past Medical History:  Diagnosis Date   Abnormal Pap smear 2011   hpv/mild dysplasia,cin1   Anxiety    Cancer (HCC)    skin- basal   Cerebral palsy (HCC)    right arm/leg   Cystocele    Depression    Headache    Neuromuscular disorder (HCC)    Cerebral Palsy   OCD (obsessive compulsive disorder)    Osteoporosis    Uterine prolaps    Past Surgical History:  Procedure Laterality Date   COLPOSCOPY  2011   ELBOW SURGERY     left elbow-separation of bones -age 60   ELBOW SURGERY  2015   fell on ice- right elbow fracture   FRACTURE SURGERY     HAMMER TOE SURGERY  1/13   RIGHT SIDE   HIP PINNING,CANNULATED Left 07/08/2016   Procedure: LEFT CLOSED REDUCTION HIP AND PERCUTANEOUS SCREW;  Surgeon: Donnice Car, MD;  Location: [facility];  Service: Orthopedics;  Laterality: Left;   ORIF HUMERUS FRACTURE Right 06/11/2021   Procedure: OPEN REDUCTION INTERNAL FIXATION (ORIF) DISTAL HUMERUS FRACTURE;  Surgeon: Celena Sharper, MD;  Location: [facility];  Service: Orthopedics;  Laterality: Right;   WRIST SURGERY  2005   left wrist   Patient Active Problem List   Diagnosis Date Noted   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   Congenital cerebral palsy  (HCC) 08/03/2018   Relationship problem with family member 08/03/2018   Closed fracture of neck of left femur, initial encounter (HCC) 07/08/2016   Severe recurrent major depression without psychotic features (HCC) 06/03/2015    Class: Chronic   Obsessive-compulsive disorder 06/03/2015    Class: Chronic    ONSET DATE: congenital   REFERRING DIAG: 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  I do have a new brace (?AFO) and it is so bulky around my knee that it hurts my knee.  Plan to give a call to the person who made it.  No new falls.  Pt accompanied by: self  PERTINENT HISTORY: CP, anxiety, depression, HA, CPD, osteoporosis, L elbow and wrist surgery, L hip pinning, R humerus ORIF  PAIN:  Are you having pain? Yes: NPRS scale: 7/10 Pain location: L neck  Pain description: sharp Aggravating factors: fall Relieving factors: TENS, chiropractor   PRECAUTIONS: Fall, osteoporosis  RED FLAGS: None   WEIGHT BEARING RESTRICTIONS: No  FALLS: Has patient fallen in last 6 months? Yes. 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, and Leisure: personal training 2x/wk  PATIENT GOALS: address LB and  neck pain   OBJECTIVE:    TODAY'S TREATMENT: 10/24/2024 Activity Comments  Gait training with cane-small rubber quad tip  Clinic distances, cues for sequence, holding in L hand versus R hand; gets off sequence after about 40 ft  Stair negotiation:  step through pattern with rails -pt wants to work on (backwards) step down like getting off of treadmill   Multiple reps, cues for foot clearance, BUE support  Stepping up onto and off of treadmill  Simulating gym treadmill-pt typically comes off backwards and is fearful-discussed and practiced turning around and coming off of treadmill forwards facing for optimal balance and foot clearance  Review HEP Good return demo  Forward step ups 10 reps both legs leading   STM and manual TPR to L side of neck  Pt reported it feels much better, thank you soft tissue restriction in UT, LS, SCM, and scalenes. Reports decreased pain            PATIENT EDUCATION: Education details: cane sequence, practiced stairs and discussed safest ways to come off of treadmill at gym.  Discussed POC (pt in agreement to hold for upcoming surgical procedure and then return to PT) Person educated: Patient Education method: Explanation Education comprehension: verbalized understanding     HOME EXERCISE PROGRAM Last updated: 10/03/24 Access Code: [email or web address] URL: [email or web address] Date: 10/03/2024 Prepared by: [facility] - Outpatient  Rehab - [facility]  Exercises - Supine Lower Trunk Rotation  - 1 x daily - 5 x weekly - 2 sets - 10 reps - Supine Pelvic Tilt  - 1 x daily - 5 x weekly - 2 sets - 10 reps - Sidelying Thoracic Rotation with Open Book  - 1 x daily - 5 x weekly - 2 sets - 10 reps - Cervical Retraction with Overpressure  - 1 x daily - 5 x weekly - 2 sets - 10 reps - 3 sec hold   Note: Objective measures were completed at Evaluation unless otherwise noted.  DIAGNOSTIC FINDINGS: none recent  COGNITION: Overall cognitive  status: Within functional limits for tasks assessed   SENSATION: Pt denies changes in N/T  COORDINATION: Alternating pronation/supination: motor limitations on R UE Alternating toe tap: motor limitations on R LE Finger to nose: motor limitations on R UE; mild tremor on L   Observation: no swelling or bruising over R knee   MUSCLE TONE: slightly increased extensor tone in R quad; no knee pain with testing  POSTURE: rounded shoulders; head tilted slightly   LOWER EXTREMITY ROM:     Active  Right Eval Left Eval  Hip flexion    Hip extension    Hip abduction    Hip adduction    Hip internal rotation    Hip external rotation    Knee flexion    Knee extension    Ankle dorsiflexion 3 11  Ankle plantarflexion    Ankle inversion    Ankle eversion     (Blank rows = not tested)  LOWER EXTREMITY MMT:    MMT (in sitting) Right Eval Left Eval  Hip flexion 4 4+  Hip extension    Hip abduction 4- 4+  Hip adduction 4- 4+  Hip internal rotation    Hip external rotation    Knee flexion 4 4  Knee extension 4 5  Ankle dorsiflexion 3+ 5  Ankle plantarflexion 3+ 4+  Ankle inversion    Ankle eversion    (Blank rows = not tested) *no knee pain during testing   GAIT: Findings: Assistive device utilized:None, Level of assistance: Modified independence and SBA, and Comments: R knee adduction, lateral trunk lean, reduced R foot clearance with foot in eversion  FUNCTIONAL TESTS:  5 times sit to stand: 19.23 sec without UE support; retropulsion and limited descending control  10 meter walk test: 14.15 sec (2.32 ft/sec)                                                                                                                                TREATMENT DATE: 09/09/24     PATIENT EDUCATION: Education details: advised pt to make appt with PCP about R knee pain; advised pt that I was not aware of any CP support groups in the area unfortunately; prognosis, POC  Person educated:  Patient Education method: Explanation Education comprehension: verbalized understanding  HOME EXERCISE PROGRAM: Not yet initiated   GOALS: Goals reviewed with patient? Yes  SHORT TERM GOALS: Target date: 09/30/2024  Patient to be independent with initial HEP. Baseline: HEP initiated Goal status: INITIAL    LONG TERM GOALS: Target date: 10/28/2024  Patient to be independent with advanced HEP. Baseline: Not yet initiated  Goal status: INITIAL  Patient to recall 3 ways to manage neck and LBP. Baseline: edu not yet initiated Goal status: IN PROGRESS  Patient to demonstrate gait speed of 2.8 ft/sec in order to improve access to community.  Baseline: 2.32 ft/sec Goal status: IN PROGRESS  Patient to score at least 18/24 on DGI in order to decrease risk of falls.  Baseline: 14/24 10/08/24 Goal status: IN PROGRESS 10/08/24  Patient to demonstrate 5xSTS test in <15 sec in order to decrease risk of falls.  Baseline: 19.23 sec without UE support; retropulsion and limited descending control  Goal status: IN PROGRESS  Patient to report understanding of fall prevention education. Baseline: not yet initiated Goal status: IN PROGRESS  ASSESSMENT:  CLINICAL IMPRESSION: Pt  presents today with no additional falls to report.  She does note continued L neck pain and tightness, which is lessened/relieved with soft tissue work. Skilled PT session also focused on stair negotiation, step up exercises for functional strengthening, and gait training with cane. Pt needs cues for correct sequence with cane, as she has used her cane with RUE in the past (with RLE being the weaker side).  She initially gets the sequence, then gets off and she declines trying walking pole today; she will likely need additional practice on correct cane sequence.  Pt is due to have a surgical procedure next week, and pt would like to resume when she is cleared by MD; will recert when she returns.   Pt will continue to  benefit from skilled PT towards goals for improved functional mobility and decreased fall risk.   OBJECTIVE IMPAIRMENTS: Abnormal gait, decreased activity tolerance, decreased balance, decreased coordination, decreased knowledge of use of DME, decreased mobility, difficulty walking, decreased ROM, decreased strength, impaired flexibility, impaired tone, postural dysfunction, and pain.   ACTIVITY LIMITATIONS: carrying, lifting, bending, standing, squatting, stairs, transfers, bathing, toileting, dressing, reach over head, hygiene/grooming, locomotion level, and caring for others  PARTICIPATION LIMITATIONS: meal prep, cleaning, laundry, driving, shopping, community activity, yard work, and church  PERSONAL FACTORS: Age, Behavior pattern, Past/current experiences, Time since onset of injury/illness/exacerbation, and 3+ comorbidities: CP, anxiety, depression, HA, CPD, osteoporosis, L elbow and wrist surgery, L hip pinning, R humerus ORIF are also affecting patient's functional outcome.   REHAB POTENTIAL: Good  CLINICAL DECISION MAKING: Evolving/moderate complexity  EVALUATION COMPLEXITY: Moderate  PLAN:  PT FREQUENCY: 1-2x/week  PT DURATION: 6 weeks  PLANNED INTERVENTIONS: 97164- PT Re-evaluation, 97110-Therapeutic exercises, 97530- Therapeutic activity, [id number]- Neuromuscular re-education, 97535- Self Care, 02859- Manual therapy, [id number]- Gait training, [phone]- Canalith repositioning, [id number]- Electrical stimulation (manual), 20560 (1-2 muscles), 20561 (3+ muscles)- Dry Needling, Patient/Family education, Balance training, Stair training, Taping, Joint mobilization, Spinal mobilization, Vestibular training, DME instructions, Cryotherapy, and Moist heat  PLAN FOR NEXT SESSION: Will need to recert when pt returns.  Gait training with [place], review and progress HEP to work on balance, add step ups to HEP.  BLE and core strength, lumbar mobility, cervical stability    Greig Anon, PT 10/24/2024 12:36  PM Phone: [phone] Fax: [phone]  [facility] at [facility] [address], [address] [place], [place] [address] Phone # [phone] Fax # [phone]         "

## 2024-10-28 NOTE — Progress Notes (Signed)
 Patient called stating she does not have instructions. Went over what medications to take the morning of  surgery. SABRA

## 2024-10-30 ENCOUNTER — Encounter (HOSPITAL_COMMUNITY): Payer: Self-pay | Admitting: Surgery

## 2024-10-30 ENCOUNTER — Encounter (HOSPITAL_COMMUNITY)
Admission: RE | Disposition: A | Discharge status: Home / Self Care | Payer: Self-pay | Source: Home / Self Care | Attending: Surgery

## 2024-10-30 ENCOUNTER — Inpatient Hospital Stay (HOSPITAL_COMMUNITY): Admitting: Certified Registered"

## 2024-10-30 ENCOUNTER — Observation Stay (HOSPITAL_COMMUNITY)
Admission: RE | Admit: 2024-10-30 | Discharge: 2024-10-31 | Disposition: A | Discharge status: Home / Self Care | Attending: Surgery | Admitting: Surgery

## 2024-10-30 ENCOUNTER — Other Ambulatory Visit: Payer: Self-pay

## 2024-10-30 DIAGNOSIS — K623 Rectal prolapse: Principal | ICD-10-CM | POA: Insufficient documentation

## 2024-10-30 DIAGNOSIS — Z85828 Personal history of other malignant neoplasm of skin: Secondary | ICD-10-CM | POA: Diagnosis not present

## 2024-10-30 HISTORY — PX: XI ROBOT ASSISTED RECTOPEXY: SHX6788

## 2024-10-30 LAB — TYPE AND SCREEN
ABO/RH(D): O NEG
Antibody Screen: NEGATIVE

## 2024-10-30 MED ORDER — SIMETHICONE 80 MG PO CHEW
40.0000 mg | CHEWABLE_TABLET | Freq: Four times a day (QID) | ORAL | Status: DC | PRN
  Administered 2024-10-30: 40 mg via ORAL
  Filled 2024-10-30: qty 1

## 2024-10-30 MED ORDER — HYDROMORPHONE HCL 1 MG/ML IJ SOLN: 0.5000 mg | INTRAMUSCULAR | Status: DC | PRN

## 2024-10-30 MED ORDER — ENSURE PRE-SURGERY PO LIQD: 296.0000 mL | Freq: Once | ORAL | Status: DC

## 2024-10-30 MED ORDER — ONDANSETRON HCL 4 MG/2ML IJ SOLN
INTRAMUSCULAR | Status: DC | PRN
  Administered 2024-10-30: 4 mg via INTRAVENOUS

## 2024-10-30 MED ORDER — ROCURONIUM BROMIDE 10 MG/ML (PF) SYRINGE
PREFILLED_SYRINGE | INTRAVENOUS | Status: AC
  Filled 2024-10-30: qty 10

## 2024-10-30 MED ORDER — OXYBUTYNIN CHLORIDE ER 10 MG PO TB24
10.0000 mg | ORAL_TABLET | Freq: Every day | ORAL | Status: DC
  Administered 2024-10-31: 10 mg via ORAL
  Filled 2024-10-30: qty 1

## 2024-10-30 MED ORDER — DIPHENHYDRAMINE HCL 12.5 MG/5ML PO ELIX: 12.5000 mg | ORAL_SOLUTION | Freq: Four times a day (QID) | ORAL | Status: DC | PRN

## 2024-10-30 MED ORDER — SUMATRIPTAN SUCCINATE 50 MG PO TABS
100.0000 mg | ORAL_TABLET | ORAL | Status: DC | PRN
  Administered 2024-10-31 (×2): 100 mg via ORAL
  Filled 2024-10-30 (×4): qty 2

## 2024-10-30 MED ORDER — BUPIVACAINE-EPINEPHRINE 0.25% -1:200000 IJ SOLN
INTRAMUSCULAR | Status: DC | PRN
  Administered 2024-10-30: 40 mL

## 2024-10-30 MED ORDER — SUGAMMADEX SODIUM 200 MG/2ML IV SOLN
INTRAVENOUS | Status: DC | PRN
  Administered 2024-10-30: 200 mg via INTRAVENOUS

## 2024-10-30 MED ORDER — ALBUTEROL-BUDESONIDE 90-80 MCG/ACT IN AERO: 2.0000 | INHALATION_SPRAY | Freq: Every day | RESPIRATORY_TRACT | Status: DC | PRN

## 2024-10-30 MED ORDER — SUGAMMADEX SODIUM 200 MG/2ML IV SOLN
INTRAVENOUS | Status: AC
  Filled 2024-10-30: qty 2

## 2024-10-30 MED ORDER — HEPARIN SODIUM (PORCINE) 5000 UNIT/ML IJ SOLN
5000.0000 [IU] | Freq: Three times a day (TID) | INTRAMUSCULAR | Status: DC
  Administered 2024-10-30 – 2024-10-31 (×3): 5000 [IU] via SUBCUTANEOUS
  Filled 2024-10-30 (×3): qty 1

## 2024-10-30 MED ORDER — ACETAMINOPHEN 500 MG PO TABS
1000.0000 mg | ORAL_TABLET | ORAL | Status: AC
  Administered 2024-10-30: 1000 mg via ORAL
  Filled 2024-10-30: qty 2

## 2024-10-30 MED ORDER — ENSURE PRE-SURGERY PO LIQD: 592.0000 mL | Freq: Once | ORAL | Status: DC

## 2024-10-30 MED ORDER — LORAZEPAM 1 MG PO TABS: 1.0000 mg | ORAL_TABLET | Freq: Every day | ORAL | Status: DC

## 2024-10-30 MED ORDER — METRONIDAZOLE 500 MG PO TABS: 1000.0000 mg | ORAL_TABLET | ORAL | Status: DC

## 2024-10-30 MED ORDER — MIDAZOLAM HCL (PF) 2 MG/2ML IJ SOLN
INTRAMUSCULAR | Status: DC | PRN
  Administered 2024-10-30: 2 mg via INTRAVENOUS

## 2024-10-30 MED ORDER — EPHEDRINE SULFATE-NACL 50-0.9 MG/10ML-% IV SOSY
PREFILLED_SYRINGE | INTRAVENOUS | Status: DC | PRN
  Administered 2024-10-30 (×2): 10 mg via INTRAVENOUS
  Administered 2024-10-30 (×2): 5 mg via INTRAVENOUS

## 2024-10-30 MED ORDER — PSYLLIUM 95 % PO PACK: 1.0000 | PACK | Freq: Every day | ORAL | Status: DC | PRN

## 2024-10-30 MED ORDER — PROPOFOL 10 MG/ML IV BOLUS
INTRAVENOUS | Status: DC | PRN
  Administered 2024-10-30: 100 mg via INTRAVENOUS

## 2024-10-30 MED ORDER — KETOROLAC TROMETHAMINE 30 MG/ML IJ SOLN: 30.0000 mg | Freq: Once | INTRAMUSCULAR | Status: DC | PRN

## 2024-10-30 MED ORDER — KETOTIFEN FUMARATE 0.035 % OP SOLN: 1.0000 [drp] | Freq: Every day | OPHTHALMIC | Status: DC | PRN

## 2024-10-30 MED ORDER — 0.9 % SODIUM CHLORIDE (POUR BTL) OPTIME
TOPICAL | Status: DC | PRN
  Administered 2024-10-30: 1000 mL

## 2024-10-30 MED ORDER — LACTATED RINGERS IV SOLN: INTRAVENOUS | Status: DC

## 2024-10-30 MED ORDER — ONDANSETRON HCL 4 MG/2ML IJ SOLN
INTRAMUSCULAR | Status: AC
  Filled 2024-10-30: qty 2

## 2024-10-30 MED ORDER — ONDANSETRON HCL 4 MG/2ML IJ SOLN: 4.0000 mg | Freq: Four times a day (QID) | INTRAMUSCULAR | Status: DC | PRN

## 2024-10-30 MED ORDER — DOCUSATE SODIUM 100 MG PO CAPS
100.0000 mg | ORAL_CAPSULE | Freq: Two times a day (BID) | ORAL | Status: DC
  Administered 2024-10-31: 100 mg via ORAL
  Filled 2024-10-30 (×2): qty 1

## 2024-10-30 MED ORDER — ALUM & MAG HYDROXIDE-SIMETH 200-200-20 MG/5ML PO SUSP: 30.0000 mL | Freq: Four times a day (QID) | ORAL | Status: DC | PRN

## 2024-10-30 MED ORDER — DEXAMETHASONE SOD PHOSPHATE PF 10 MG/ML IJ SOLN
INTRAMUSCULAR | Status: DC | PRN
  Administered 2024-10-30: 4 mg via INTRAVENOUS

## 2024-10-30 MED ORDER — LORATADINE 10 MG PO TABS
10.0000 mg | ORAL_TABLET | Freq: Every day | ORAL | Status: DC
  Administered 2024-10-31: 10 mg via ORAL
  Filled 2024-10-30: qty 1

## 2024-10-30 MED ORDER — ENSURE SURGERY PO LIQD
237.0000 mL | Freq: Two times a day (BID) | ORAL | Status: DC
  Administered 2024-10-30: 237 mL via ORAL

## 2024-10-30 MED ORDER — FENTANYL CITRATE (PF) 100 MCG/2ML IJ SOLN
INTRAMUSCULAR | Status: AC
  Filled 2024-10-30: qty 2

## 2024-10-30 MED ORDER — FENTANYL CITRATE (PF) 50 MCG/ML IJ SOSY
25.0000 ug | PREFILLED_SYRINGE | INTRAMUSCULAR | Status: DC | PRN
  Administered 2024-10-30 (×4): 25 ug via INTRAVENOUS

## 2024-10-30 MED ORDER — HYDRALAZINE HCL 20 MG/ML IJ SOLN: 10.0000 mg | INTRAMUSCULAR | Status: DC | PRN

## 2024-10-30 MED ORDER — POLYETHYLENE GLYCOL 3350 17 G PO PACK
17.0000 g | PACK | Freq: Two times a day (BID) | ORAL | Status: DC
  Administered 2024-10-30 – 2024-10-31 (×2): 17 g via ORAL
  Filled 2024-10-30 (×2): qty 1

## 2024-10-30 MED ORDER — HYDROMORPHONE HCL 1 MG/ML IJ SOLN
INTRAMUSCULAR | Status: AC
  Filled 2024-10-30: qty 1

## 2024-10-30 MED ORDER — PROGESTERONE MICRONIZED 100 MG PO CAPS
100.0000 mg | ORAL_CAPSULE | Freq: Every day | ORAL | Status: DC
  Administered 2024-10-31: 100 mg via ORAL
  Filled 2024-10-30: qty 1

## 2024-10-30 MED ORDER — CHLORHEXIDINE GLUCONATE CLOTH 2 % EX PADS: 6.0000 | MEDICATED_PAD | Freq: Once | CUTANEOUS | Status: DC

## 2024-10-30 MED ORDER — AZELASTINE HCL 0.1 % NA SOLN
1.0000 | Freq: Two times a day (BID) | NASAL | Status: DC
  Administered 2024-10-30: 1 via NASAL
  Filled 2024-10-30: qty 30

## 2024-10-30 MED ORDER — FENTANYL CITRATE (PF) 100 MCG/2ML IJ SOLN
INTRAMUSCULAR | Status: DC | PRN
  Administered 2024-10-30: 100 ug via INTRAVENOUS

## 2024-10-30 MED ORDER — HEPARIN SODIUM (PORCINE) 5000 UNIT/ML IJ SOLN
5000.0000 [IU] | Freq: Once | INTRAMUSCULAR | Status: AC
  Administered 2024-10-30: 5000 [IU] via SUBCUTANEOUS
  Filled 2024-10-30: qty 1

## 2024-10-30 MED ORDER — OXYCODONE HCL 5 MG PO TABS: 5.0000 mg | ORAL_TABLET | Freq: Once | ORAL | Status: DC | PRN

## 2024-10-30 MED ORDER — FENTANYL CITRATE (PF) 50 MCG/ML IJ SOSY
PREFILLED_SYRINGE | INTRAMUSCULAR | Status: AC
  Filled 2024-10-30: qty 1

## 2024-10-30 MED ORDER — LACTATED RINGERS IR SOLN
Status: DC | PRN
  Administered 2024-10-30: 1000 mL

## 2024-10-30 MED ORDER — PHENYLEPHRINE 80 MCG/ML (10ML) SYRINGE FOR IV PUSH (FOR BLOOD PRESSURE SUPPORT)
PREFILLED_SYRINGE | INTRAVENOUS | Status: DC | PRN
  Administered 2024-10-30: 160 ug via INTRAVENOUS
  Administered 2024-10-30: 80 ug via INTRAVENOUS

## 2024-10-30 MED ORDER — POLYETHYLENE GLYCOL 3350 17 GM/SCOOP PO POWD: 238.0000 g | Freq: Once | ORAL | Status: DC

## 2024-10-30 MED ORDER — OXYCODONE HCL 5 MG/5ML PO SOLN: 5.0000 mg | Freq: Once | ORAL | Status: DC | PRN

## 2024-10-30 MED ORDER — AMISULPRIDE (ANTIEMETIC) 5 MG/2ML IV SOLN: 10.0000 mg | Freq: Once | INTRAVENOUS | Status: DC | PRN

## 2024-10-30 MED ORDER — KETAMINE HCL 50 MG/5ML IJ SOSY
PREFILLED_SYRINGE | INTRAMUSCULAR | Status: AC
  Filled 2024-10-30: qty 5

## 2024-10-30 MED ORDER — ONDANSETRON HCL 4 MG PO TABS: 4.0000 mg | ORAL_TABLET | Freq: Four times a day (QID) | ORAL | Status: DC | PRN

## 2024-10-30 MED ORDER — ALBUTEROL SULFATE (2.5 MG/3ML) 0.083% IN NEBU: 2.5000 mg | INHALATION_SOLUTION | Freq: Every day | RESPIRATORY_TRACT | Status: DC | PRN

## 2024-10-30 MED ORDER — LORAZEPAM 1 MG PO TABS
2.0000 mg | ORAL_TABLET | Freq: Every day | ORAL | Status: DC
  Administered 2024-10-31: 2 mg via ORAL
  Filled 2024-10-30: qty 2

## 2024-10-30 MED ORDER — PHENYLEPHRINE 80 MCG/ML (10ML) SYRINGE FOR IV PUSH (FOR BLOOD PRESSURE SUPPORT)
PREFILLED_SYRINGE | INTRAVENOUS | Status: AC
  Filled 2024-10-30: qty 10

## 2024-10-30 MED ORDER — ROCURONIUM BROMIDE 10 MG/ML (PF) SYRINGE
PREFILLED_SYRINGE | INTRAVENOUS | Status: DC | PRN
  Administered 2024-10-30 (×2): 20 mg via INTRAVENOUS
  Administered 2024-10-30: 50 mg via INTRAVENOUS

## 2024-10-30 MED ORDER — PROPRANOLOL HCL ER 60 MG PO CP24
60.0000 mg | ORAL_CAPSULE | Freq: Every day | ORAL | Status: DC
  Administered 2024-10-31: 60 mg via ORAL
  Filled 2024-10-30: qty 1

## 2024-10-30 MED ORDER — NEOMYCIN SULFATE 500 MG PO TABS: 1000.0000 mg | ORAL_TABLET | ORAL | Status: DC

## 2024-10-30 MED ORDER — DEXAMETHASONE SOD PHOSPHATE PF 10 MG/ML IJ SOLN
INTRAMUSCULAR | Status: AC
  Filled 2024-10-30: qty 1

## 2024-10-30 MED ORDER — SCOPOLAMINE 1 MG/3DAYS TD PT72
1.0000 | MEDICATED_PATCH | TRANSDERMAL | Status: DC
  Administered 2024-10-30: 1 mg via TRANSDERMAL
  Filled 2024-10-30: qty 1

## 2024-10-30 MED ORDER — BUPROPION HCL ER (XL) 150 MG PO TB24
150.0000 mg | ORAL_TABLET | Freq: Every day | ORAL | Status: DC
  Administered 2024-10-31: 150 mg via ORAL
  Filled 2024-10-30: qty 1

## 2024-10-30 MED ORDER — LIDOCAINE HCL (CARDIAC) PF 100 MG/5ML IV SOSY
PREFILLED_SYRINGE | INTRAVENOUS | Status: DC | PRN
  Administered 2024-10-30: 60 mg via INTRAVENOUS

## 2024-10-30 MED ORDER — TEMAZEPAM 15 MG PO CAPS
30.0000 mg | ORAL_CAPSULE | Freq: Every day | ORAL | Status: DC
  Administered 2024-10-30: 30 mg via ORAL
  Filled 2024-10-30: qty 2

## 2024-10-30 MED ORDER — BISACODYL 5 MG PO TBEC
5.0000 mg | DELAYED_RELEASE_TABLET | Freq: Every day | ORAL | Status: DC
  Administered 2024-10-31: 5 mg via ORAL
  Filled 2024-10-30: qty 1

## 2024-10-30 MED ORDER — FLUVOXAMINE MALEATE 50 MG PO TABS
100.0000 mg | ORAL_TABLET | Freq: Every morning | ORAL | Status: DC
  Administered 2024-10-31: 100 mg via ORAL
  Filled 2024-10-30: qty 2

## 2024-10-30 MED ORDER — FLUTICASONE PROPIONATE 50 MCG/ACT NA SUSP
1.0000 | Freq: Two times a day (BID) | NASAL | Status: DC
  Administered 2024-10-30: 1 via NASAL
  Filled 2024-10-30: qty 16

## 2024-10-30 MED ORDER — FLUVOXAMINE MALEATE 50 MG PO TABS
300.0000 mg | ORAL_TABLET | Freq: Every day | ORAL | Status: DC
  Administered 2024-10-30: 300 mg via ORAL
  Filled 2024-10-30 (×2): qty 6

## 2024-10-30 MED ORDER — LIDOCAINE HCL (PF) 2 % IJ SOLN
INTRAMUSCULAR | Status: AC
  Filled 2024-10-30: qty 5

## 2024-10-30 MED ORDER — LORAZEPAM 1 MG PO TABS: 1.0000 mg | ORAL_TABLET | Freq: Every evening | ORAL | Status: DC | PRN

## 2024-10-30 MED ORDER — KETAMINE HCL 50 MG/5ML IJ SOSY
PREFILLED_SYRINGE | INTRAMUSCULAR | Status: DC | PRN
  Administered 2024-10-30 (×2): 10 mg via INTRAVENOUS

## 2024-10-30 MED ORDER — BUPIVACAINE-EPINEPHRINE (PF) 0.25% -1:200000 IJ SOLN
INTRAMUSCULAR | Status: AC
  Filled 2024-10-30: qty 60

## 2024-10-30 MED ORDER — SODIUM CHLORIDE 0.9 % IV SOLN
2.0000 g | INTRAVENOUS | Status: AC
  Administered 2024-10-30: 2 g via INTRAVENOUS
  Filled 2024-10-30: qty 2

## 2024-10-30 MED ORDER — IBUPROFEN 200 MG PO TABS
600.0000 mg | ORAL_TABLET | Freq: Four times a day (QID) | ORAL | Status: DC | PRN
  Administered 2024-10-31: 600 mg via ORAL
  Filled 2024-10-30: qty 3

## 2024-10-30 MED ORDER — MIDAZOLAM HCL 2 MG/2ML IJ SOLN
INTRAMUSCULAR | Status: AC
  Filled 2024-10-30: qty 2

## 2024-10-30 MED ORDER — PROPOFOL 10 MG/ML IV BOLUS
INTRAVENOUS | Status: AC
  Filled 2024-10-30: qty 20

## 2024-10-30 MED ORDER — BISACODYL 5 MG PO TBEC: 20.0000 mg | DELAYED_RELEASE_TABLET | Freq: Once | ORAL | Status: DC

## 2024-10-30 MED ORDER — CHLORHEXIDINE GLUCONATE 0.12 % MT SOLN
15.0000 mL | Freq: Once | OROMUCOSAL | Status: AC
  Administered 2024-10-30: 15 mL via OROMUCOSAL

## 2024-10-30 MED ORDER — OXYCODONE HCL 5 MG PO TABS: 5.0000 mg | ORAL_TABLET | Freq: Four times a day (QID) | ORAL | 0 refills | Status: AC | PRN

## 2024-10-30 MED ORDER — BACLOFEN 10 MG PO TABS: 10.0000 mg | ORAL_TABLET | Freq: Every evening | ORAL | Status: DC | PRN

## 2024-10-30 MED ORDER — ORAL CARE MOUTH RINSE: 15.0000 mL | Freq: Once | OROMUCOSAL | Status: AC

## 2024-10-30 MED ORDER — EPHEDRINE 5 MG/ML INJ
INTRAVENOUS | Status: AC
  Filled 2024-10-30: qty 10

## 2024-10-30 MED ORDER — BUDESONIDE 0.25 MG/2ML IN SUSP: 0.2500 mg | Freq: Every day | RESPIRATORY_TRACT | Status: DC | PRN

## 2024-10-30 MED ORDER — TRAMADOL HCL 50 MG PO TABS
50.0000 mg | ORAL_TABLET | Freq: Four times a day (QID) | ORAL | Status: DC | PRN
  Administered 2024-10-30: 50 mg via ORAL
  Filled 2024-10-30: qty 1

## 2024-10-30 MED ORDER — DIPHENHYDRAMINE HCL 50 MG/ML IJ SOLN: 12.5000 mg | Freq: Four times a day (QID) | INTRAMUSCULAR | Status: DC | PRN

## 2024-10-30 MED ORDER — AZELASTINE-FLUTICASONE 137-50 MCG/ACT NA SUSP: 1.0000 | Freq: Two times a day (BID) | NASAL | Status: DC

## 2024-10-30 MED ORDER — ACETAMINOPHEN 500 MG PO TABS
1000.0000 mg | ORAL_TABLET | Freq: Four times a day (QID) | ORAL | Status: DC
  Administered 2024-10-30 – 2024-10-31 (×3): 1000 mg via ORAL
  Filled 2024-10-30 (×4): qty 2

## 2024-10-30 NOTE — Anesthesia Procedure Notes (Signed)
 Procedure Name: Intubation Date/Time: 10/30/2024 12:14 PM  Performed by: Brandy Almarie BROCKS, CRNAPre-anesthesia Checklist: Patient identified, Emergency Drugs available, Suction available and Patient being monitored Patient Re-evaluated:Patient Re-evaluated prior to induction Oxygen Delivery Method: Circle system utilized Preoxygenation: Pre-oxygenation with 100% oxygen Induction Type: IV induction Ventilation: Mask ventilation without difficulty Laryngoscope Size: 4 and Mac Grade View: Grade I Tube type: Oral Tube size: 7.0 mm Number of attempts: 1 Airway Equipment and Method: Stylet Placement Confirmation: ETT inserted through vocal cords under direct vision, positive ETCO2 and breath sounds checked- equal and bilateral Secured at: 21 cm Tube secured with: Tape Dental Injury: Teeth and Oropharynx as per pre-operative assessment

## 2024-10-30 NOTE — H&P (Signed)
 "  CC: Here today for surgery  HPI: Casey Diaz is an 59 y.o. female with history of Cerebral palsy (some gait and fine motor issues), OCD, headaches, chronic constipation, whom is seen in the office today as a referral by Casey Diaz for evaluation of hemorrhoids.   She reports that beginning approximately in June of this year she began noticing something that would pop out of the anal area with bowel movements. It is often times out but she can reduce it. She denies any issues with incontinence to solid, liquid stool, or gas. She reports constipation and often times will take Dulcolax every evening. She is not currently taking any fiber supplements. She drinks upwards of 64 ounces of water per day. She will spend 3 to 30 minutes on the commode having bowel movements. She reports she has never actually seen this herself has just been aware of it.   She reports she had a colonoscopy 5 years ago while living in [place].  She denies any prior anorectal surgery or procedures.  Colonoscopy - went to see Casey Diaz with Casey Diaz -reports she was told she had 2 small polyps that were removed and that she would need a repeat colonoscopy in 2 years. We do not have a copy of it at this time and have requested it.  She reports that her symptoms are slightly improved. She does however report that the prolapse she has been experiencing appears to be more of a rectal prolapse. She was seen in the ER after colonoscopy and was told by one of the ER physicians that she may have rectal prolapse as an etiology. She does describe prolapsing tissue from the anal canal with some occasional bleeding that had actually subsided on fiber supplementation/bowel regimen with Dulcolax and MiraLAX . She does still have the prolapsing component however and this often times can make it difficult for her to distinguish if there is actually stool present or not.  INTERVAL HX She denies any changes in health or health history  since we met in the office. No new medications/allergies. She states she is ready for surgery today. Tolerated bowel prep with satisfactory result.   PMH: Cerebral palsy (some gait and fine motor issues), OCD, headaches, chronic constipation  PSH: Denies any prior anorectal surgeries or procedures.  FHx: Denies any known family history of colorectal, breast, endometrial or ovarian cancer  Social Hx: Denies use of tobacco/EtOH/illicit drug. She returns today with her husband    Past Medical History:  Diagnosis Date   Abnormal Pap smear 2011   hpv/mild dysplasia,cin1   Anxiety    Cancer (HCC)    skin- basal   Cerebral palsy (HCC)    right arm/leg   Cystocele    Depression    Headache    Neuromuscular disorder (HCC)    Cerebral Palsy   OCD (obsessive compulsive disorder)    Osteoporosis    Uterine prolaps     Past Surgical History:  Procedure Laterality Date   COLPOSCOPY  2011   ELBOW SURGERY     left elbow-separation of bones -age 59   ELBOW SURGERY  2015   fell on ice- right elbow fracture   FRACTURE SURGERY     HAMMER TOE SURGERY  1/13   RIGHT SIDE   HIP PINNING,CANNULATED Left 07/08/2016   Procedure: LEFT CLOSED REDUCTION HIP AND PERCUTANEOUS SCREW;  Surgeon: Donnice Car, MD;  Location: [facility];  Service: Orthopedics;  Laterality: Left;   ORIF HUMERUS FRACTURE Right  06/11/2021   Procedure: OPEN REDUCTION INTERNAL FIXATION (ORIF) DISTAL HUMERUS FRACTURE;  Surgeon: Celena Sharper, MD;  Location: [facility];  Service: Orthopedics;  Laterality: Right;   WRIST SURGERY  2005   left wrist    Family History  Problem Relation Age of Onset   Cancer Father        skin AND LUNG   Alcohol abuse Sister        CRACK COCAINE    Social:  reports that she has never smoked. She has never used smokeless tobacco. She reports that she does not currently use alcohol. She reports that she does not use drugs.  Allergies: Allergies[1]  Medications: I have reviewed the patient's current  medications.  No results found for this or any previous visit (from the past 48 hours).  No results found.   PE Blood pressure 114/84, pulse 66, temperature 97.9 F (36.6 C), temperature source Oral, resp. rate 16, weight 78.5 kg, SpO2 96%. Constitutional: NAD; conversant Eyes: Moist conjunctiva; no lid lag; anicteric Lungs: Normal respiratory effort CV: RRR Diaz: Abd soft, NT/ND Psychiatric: Appropriate affect  No results found for this or any previous visit (from the past 48 hours).  No results found.  A/P: Casey Diaz is an 59 y.o. female with hx of cerebral palsy (some gait and fine motor issues), OCD, headaches, chronic constipation here for evaluation of hemorrhoids  - Colonoscopy was obtained with Eagle Diaz-Casey Diaz. She reports 2 small polyps were removed and a repeat was planned in 2 years. We have requested a copy of this report.  -Examination on the commode today does demonstrate evidence of full-thickness rectal prolapse. This does spontaneously reduce. Has caused her quality of life related issues including difficulty discerning stool versus prolapse. For all these reasons, she would like to have this fixed if at all possible.  -The anatomy and physiology of the Diaz tract was reviewed with her and her husband. The pathophysiology of prolapse was discussed as well with associated pictures. -We have discussed various different treatment options going forward including surgery (the most definitive) to address this - robotic assisted suture rectopexy, possible flexible sigmoidoscopy -The planned procedure, material risks (including, but not limited to, pain, bleeding, infection, scarring, need for blood transfusion, damage to surrounding structures- blood vessels/nerves/viscus/organs, damage to ureter, urine leak, leak from anastomosis, need for additional procedures, uncommon but potential scenarios where a stoma may be necessary, worsening of pre-existing medical  conditions, chronic diarrhea, constipation secondary to narcotic use, hernia formation with surgery, recurrence, blood clot, pulmonary embolus, pneumonia, heart attack, stroke, death) benefits and alternatives to surgery were discussed at length. The patient's questions were answered to her and her husband's satisfaction, they voiced understanding and elected to proceed with surgery. Additionally, we discussed typical postoperative expectations and the recovery process.   Lonni Pizza, MD [facility]    [1]  Allergies Allergen Reactions   Dextromethorphan-Bupropion  Er Other (See Comments)    Unknown    Hydrocodone  Itching   Sulfamethoxazole-Trimethoprim Itching   Tilactase Other (See Comments)    Unknown    Bee Pollen Other (See Comments)    Unknown    Dust Mite Extract Other (See Comments)    Sneezing, watery eyes, runny nose   Hydrocodone -Acetaminophen  Itching   Latex Itching   Other Other (See Comments)    PT IS ALLERGIC TO CAT DANDER AND RAGWEED - Sneezing, watery eyes, runny nose    Pollen Extract Other (See Comments)  Sneezing, watery eyes, runny nose    Sulfamethoxazole Other (See Comments)    Unknown    "

## 2024-10-30 NOTE — Anesthesia Preprocedure Evaluation (Addendum)
"                                    Anesthesia Evaluation  Patient identified by MRN, date of birth, ID band Patient awake    Reviewed: Allergy & Precautions, NPO status , Patient's Chart, lab work & pertinent test results  Airway Mallampati: I  TM Distance: >3 FB Neck ROM: Full    Dental no notable dental hx.    Pulmonary neg pulmonary ROS   Pulmonary exam normal        Cardiovascular negative cardio ROS Normal cardiovascular exam     Neuro/Psych  Headaches PSYCHIATRIC DISORDERS Anxiety Depression    OCD (obsessive compulsive disorder)Cerebral palsy. Right-sided weakness  Neuromuscular disease    GI/Hepatic Neg liver ROS, Bowel prep,,,  Endo/Other  negative endocrine ROS    Renal/GU negative Renal ROS     Musculoskeletal   Abdominal   Peds  Hematology negative hematology ROS (+)   Anesthesia Other Findings RECTAL PROLAPS FULL THICKENING  Reproductive/Obstetrics                              Anesthesia Physical Anesthesia Plan  ASA: 2  Anesthesia Plan: General   Post-op Pain Management:    Induction: Intravenous  PONV Risk Score and Plan: 4 or greater and Ondansetron , Dexamethasone , Midazolam , Scopolamine  patch - Pre-op and Treatment may vary due to age or medical condition  Airway Management Planned: Oral ETT  Additional Equipment:   Intra-op Plan:   Post-operative Plan: Extubation in OR  Informed Consent: I have reviewed the patients History and Physical, chart, labs and discussed the procedure including the risks, benefits and alternatives for the proposed anesthesia with the patient or authorized representative who has indicated his/her understanding and acceptance.     Dental advisory given  Plan Discussed with: CRNA  Anesthesia Plan Comments:          Anesthesia Quick Evaluation  "

## 2024-10-30 NOTE — Discharge Instructions (Signed)
 POST OP INSTRUCTIONS AFTER COLON SURGERY  DIET: Be sure to include lots of fluids daily to stay hydrated - 64oz of water per day (8, 8 oz glasses).  Avoid fast food or heavy meals for the first couple of weeks as your are more likely to get nauseated. Avoid raw/uncooked fruits or vegetables for the first 4 weeks (its ok to have these if they are blended into smoothie form). If you have fruits/vegetables, make sure they are cooked until soft enough to mash on the roof of your mouth and chew your food well. Otherwise, diet as tolerated.  Take your usually prescribed home medications unless otherwise directed.  PAIN CONTROL: Pain is best controlled by a usual combination of three different methods TOGETHER: Ice/Heat Over the counter pain medication Prescription pain medication Most patients will experience some swelling and bruising around the surgical site.  Ice packs or heating pads (30-60 minutes up to 6 times a day) will help. Some people prefer to use ice alone, heat alone, alternating between ice & heat.  Experiment to what works for you.  Swelling and bruising can take several weeks to resolve.   It is helpful to take an over-the-counter pain medication regularly for the first few weeks: Ibuprofen (Motrin/Advil) - 200mg  tabs - take 3 tabs (600mg ) every 6 hours as needed for pain (unless you have been directed previously to avoid NSAIDs/ibuprofen) Acetaminophen (Tylenol) - you may take 650mg  every 6 hours as needed. You can take this with motrin as they act differently on the body. If you are taking a narcotic pain medication that has acetaminophen in it, do not take over the counter tylenol at the same time. NOTE: You may take both of these medications together - most patients  find it most helpful when alternating between the two (i.e. Ibuprofen at 6am, tylenol at 9am, ibuprofen at 12pm ..Marland Kitchen) A  prescription for pain medication should be given to you upon discharge.  Take your pain medication as  prescribed if your pain is not adequatly controlled with the over-the-counter pain reliefs mentioned above.  Avoid getting constipated.  Between the surgery and the pain medications, it is common to experience some constipation.  Increasing fluid intake and taking a fiber supplement (such as Metamucil, Citrucel, FiberCon, MiraLax, etc) 1-2 times a day regularly will usually help prevent this problem from occurring.  A mild laxative (prune juice, Milk of Magnesia, MiraLax, etc) should be taken according to package directions if there are no bowel movements after 48 hours.    Dressing: Your incisions are covered in Dermabond which is like sterile superglue for the skin. This will come off on it's own in a couple weeks. It is waterproof and you may bathe normally starting the day after your surgery in a shower. Avoid baths/pools/lakes/oceans until your wounds have fully healed.  ACTIVITIES as tolerated:   Avoid heavy lifting (>10lbs or 1 gallon of milk) for the next 6 weeks. You may resume regular daily activities as tolerated--such as daily self-care, walking, climbing stairs--gradually increasing activities as tolerated.  If you can walk 30 minutes without difficulty, it is safe to try more intense activity such as jogging, treadmill, bicycling, low-impact aerobics.  DO NOT PUSH THROUGH PAIN.  Let pain be your guide: If it hurts to do something, don't do it. You may drive when you are no longer taking prescription pain medication, you can comfortably wear a seatbelt, and you can safely maneuver your car and apply brakes.  FOLLOW UP in our  office Please call [facility] at [phone] to set up an appointment to see your surgeon in the office for a follow-up appointment approximately 2 weeks after your surgery. Make sure that you call for this appointment the day you arrive home to insure a convenient appointment time.  9. If you have disability or family leave forms that need to be completed, you may have  them completed by your primary care physician's office; for return to work instructions, please ask our office staff and they will be happy to assist you in obtaining this documentation   When to call us [phone]: Poor pain control Reactions / problems with new medications (rash/itching, etc)  Fever over 101.5 F (38.5 C) Inability to urinate Nausea/vomiting Worsening swelling or bruising Continued bleeding from incision. Increased pain, redness, or drainage from the incision  The clinic staff is available to answer your questions during regular business hours (8:30am-5pm).  Please don't hesitate to call and ask to speak to one of our nurses for clinical concerns.   A surgeon from [facility] is always on call at the hospitals   If you have a medical emergency, go to the nearest emergency room or call 911.  [facility] [address], [address], [place], [place]  [address] MAIN: [phone] FAX: [phone] [email or web address]

## 2024-10-30 NOTE — Anesthesia Postprocedure Evaluation (Signed)
"   Anesthesia Post Note  Patient: Marea E Rothenberger  Procedure(s) Performed: RECTOPEXY, ROBOT-ASSISTED     Patient location during evaluation: PACU Anesthesia Type: General Level of consciousness: awake Pain management: pain level controlled Vital Signs Assessment: post-procedure vital signs reviewed and stable Respiratory status: spontaneous breathing, nonlabored ventilation and respiratory function stable Cardiovascular status: blood pressure returned to baseline and stable Postop Assessment: no apparent nausea or vomiting Anesthetic complications: no   No notable events documented.  Last Vitals:  Vitals:   10/30/24 1729 10/30/24 1736  BP: 104/77   Pulse: 62   Resp: 16   Temp:  36.6 C  SpO2: 96%     Last Pain:  Vitals:   10/30/24 1752  TempSrc:   PainSc: 6                  Louella Medaglia P Kileigh Ortmann      "

## 2024-10-30 NOTE — Transfer of Care (Signed)
 Immediate Anesthesia Transfer of Care Note  Patient: Casey Diaz  Procedure(s) Performed: RECTOPEXY, ROBOT-ASSISTED  Patient Location: PACU  Anesthesia Type:General  Level of Consciousness: awake, alert , and oriented  Airway & Oxygen Therapy: Patient Spontanous Breathing and Patient connected to face mask oxygen  Post-op Assessment: Report given to RN, Post -op Vital signs reviewed and stable, and Patient moving all extremities X 4  Post vital signs: Reviewed and stable  Last Vitals:  Vitals Value Taken Time  BP 123/81   Temp    Pulse 62 10/30/24 14:16  Resp 12   SpO2 100 % 10/30/24 14:16  Vitals shown include unfiled device data.  Last Pain:  Vitals:   10/30/24 1035  TempSrc:   PainSc: 0-No pain         Complications: No notable events documented.

## 2024-10-30 NOTE — Op Note (Signed)
 PATIENT: Casey Diaz  59 y.o. female  Patient Care Team: Onita Rush, MD as PCP - General (Internal Medicine)  PREOP DIAGNOSIS: RECTAL PROLAPS FULL THICKENING  POSTOP DIAGNOSIS: RECTAL PROLAPS FULL THICKENING  PROCEDURE:  Robotic assisted suture rectopexy Bilateral transversus abdominus plane (TAP) blocks  SURGEON: Lonni HERO. Daegan Arizmendi, MD  ASSISTANT: Bernarda Ned, MD  An experienced assistant was required given the complexity of this procedure and the standard of surgical care. My assistant helped with exposure through counter tension, suctioning, ligation and retraction to better visualize the surgical field. My assistant expedited sewing during the case by following my sutures. Wherever I use the term we in the report, my assistant actively helped me with that portion of the procedure.   ANESTHESIA: General endotracheal  EBL: 15 mL Total I/O In: 1050 [I.V.:950; IV Piggyback:100] Out: 165 [Urine:150; Blood:15]  DRAINS: none  SPECIMEN: none  COUNTS: Sponge, needle and instrument counts were reported correct x2  FINDINGS: Full thickness rectal prolapse evident with valsalva which will freely reduce. Robotic suture rectopexy carried out with nice taught result at completion. Of note, she does have some enlarged vessels in her posterior pelvis including 1 or 2 sizable veins and a crossing artery.  No injury to these vessels.  NARRATIVE: The patient was seen in the pre-op holding area. The risks, benefits, complications, treatment options, and expected outcomes were previously discussed with the patient. The patient agreed with the proposed plan and has signed the informed consent form. The patient was brought to the operating room by the surgical team, identified as Christain E. Selvey, and the procedure verified. She was placed supine on the operating table and SCD's were applied. General endotracheal anesthesia was induced without difficulty. She was then positioned in the  lithotomy position with Allen stirrups.  Pressure points were evaluated and padded.  A foley catheter was then placed by nursing under sterile conditions. Hair on the abdomen was clipped.  She was secured to the operating table. The abdomen was then prepped and draped in the standard sterile fashion. A time out was completed and the above information confirmed and need for preoperative antibiotics.  An OG tube was placed by anesthesia and confirmed to be to suction.  At Palmer's point, a stab incision was created and the Veress needle was introduced into the peritoneal cavity on the first attempt.  Intraperitoneal location was confirmed by the aspiration and saline drop test.  Pneumoperitoneum was established to a maximum pressure of 15 mmHg using CO2.  Following this, the abdomen was marked for planned trocar sites.  Just to the right and cephalad to the umbilicus, an 8 mm incision was created and an 8 mm blunt tipped robotic trocar was cautiously placed into the peritoneal cavity.  The laparoscope was inserted and demonstrated no evidence of trocar site nor Veress needle site complications.  The Veress needle was removed.  Bilateral transversus abdominis plane blocks were then created using a dilute mixture of Exparel  with Marcaine .  3 additional 8 mm robotic trochars were placed under direct visualization roughly in a line extending from the right ASIS towards the left upper quadrant. The bladder was inspected and noted to be at/below the pubic symphysis.  Staying 3 fingerbreadths above the pubic symphysis, an incision was created and the 12 mm robotic trocar inserted directed cephalad into the peritoneal cavity under direct visualization.  An additional 5 mm assist port was placed in the right lateral abdomen under direct visualization.  The abdomen was surveyed and  there was mildly redundant sigmoid colon entering into her pelvis with subtle scarring of the mesorectum consistent with her known history of  rectal prolapse.  She was positioned in Trendelenburg with the left side tilted slightly up.  Small bowel was carefully retracted out of the pelvis.  The robot was then docked and I went to the console.   The sigmoid colon was readily identified.  The rectosigmoid colon was grasped and elevated anteriorly.  Beginning with a medial to lateral approach, the peritoneum overlying the presacral space was carefully incised.  The TME plane was readily gained working in a plane between the fascia propria of the rectum and the presacral fascia.  Hypogastric nerves were seen going along the the presacral fascia and were protected free of injury. The left and right ureter were identified and protected free of injury.  Gonadal vessels were also seen and protected.  We first began with our TME level dissection into the pelvis from a posterior based approach.  This was continued all the way down to the pelvic floor.  The right rectal attachments were then taken down using the vessel sealer.  We continued our mobilization laterally along the right side of the rectum all the way down to the pelvic floor.  We then incised the peritoneum anteriorly and completed our anterior dissection.  We then looked again posteriorly and ensured that we had dissected all the way down to the top of the anal canal which we had.  We had mobilized the rectum laterally but leaving the peritoneal attachments on the left side of the rectum in place.  Of note, she does have some enlarged vessels in her posterior pelvis including 1 or 2 sizable veins and a crossing artery.  We are able to maintain these in the posterior portion of the plane staying well away from them and free from injury.  For completing a near circumferential mobilization with the exception of the left side where peritoneal attachments do not appear to be tethering the rectum, we inspected the pexy.  With the mesorectum being pulled somewhat taut up to the level of the sacral  promontory, there is a nice straight leg of the rectum in the pelvis.  I went down below and did a rectal exam.  There is a nice smooth taut pull of the rectum.  There is no prolapse.  The result appears excellent.  We turned our attention to the suture rectopexy portion.  Two 2-0 Ethibond sutures were used to secure the mid mesorectum to the sacral promontory resulting this nice taut lay.  This was secured with a third 2-0 V-Loc suture.  The pelvis is then irrigated.  Hemostasis is verified.  As such a drain was not placed. 2-0 V-Loc suture was then used to close the peritoneum along the right side.  The right ureter was identified during this closure and protected.  In doing so, we have closed the pelvis back off such that no small bowel should slip behind the rectum.  All needles are removed.  Our needle counts are reported correct.  The abdomen is then inspected.  There is no bleeding or any evident injury. No retained suture.  The robot is subsequently undocked and I scrubbed back in.  Robotic ports were removed under direct visualization and noted to be hemostatic.  The 5 mm port was also removed after releasing pneumoperitoneum.  All sponge, needle, and instrument counts were reported correct x 2.  The abdomen is washed and dried.  The skin of all incision sites is closed using 4-0 Monocryl subcuticular suture.  Dermabond is subsequently applied.  She was then taken out of lithotomy, awakened from anesthesia, extubated, and transferred to a stretcher for transport to PACU in satisfactory condition having tolerated the procedure well.

## 2024-10-31 ENCOUNTER — Encounter (HOSPITAL_COMMUNITY): Payer: Self-pay | Admitting: Surgery

## 2024-10-31 DIAGNOSIS — K623 Rectal prolapse: Secondary | ICD-10-CM | POA: Diagnosis not present

## 2024-10-31 LAB — CBC
HCT: 42.1 % (ref 36.0–46.0)
Hemoglobin: 13.7 g/dL (ref 12.0–15.0)
MCH: 30.6 pg (ref 26.0–34.0)
MCHC: 32.5 g/dL (ref 30.0–36.0)
MCV: 94 fL (ref 80.0–100.0)
Platelets: 244 K/uL (ref 150–400)
RBC: 4.48 MIL/uL (ref 3.87–5.11)
RDW: 14.3 % (ref 11.5–15.5)
WBC: 7.2 K/uL (ref 4.0–10.5)
nRBC: 0 % (ref 0.0–0.2)

## 2024-10-31 LAB — BASIC METABOLIC PANEL WITH GFR
Anion gap: 6 (ref 5–15)
BUN: 7 mg/dL (ref 6–20)
CO2: 25 mmol/L (ref 22–32)
Calcium: 10 mg/dL (ref 8.9–10.3)
Chloride: 106 mmol/L (ref 98–111)
Creatinine, Ser: 0.75 mg/dL (ref 0.44–1.00)
GFR, Estimated: >60 mL/min
Glucose, Bld: 120 mg/dL — ABNORMAL HIGH (ref 70–99)
Potassium: 4.2 mmol/L (ref 3.5–5.1)
Sodium: 137 mmol/L (ref 135–145)

## 2024-10-31 NOTE — Progress Notes (Signed)
 Patient discharged home, IV removed, discharge paperwork provided and explained, patient verbalized understanding.

## 2024-10-31 NOTE — Progress Notes (Signed)
" °   10/31/24 1010  TOC Brief Assessment  Insurance and Status Reviewed  Patient has primary care physician Yes  Home environment has been reviewed home with spouse  Prior level of function: independent  Prior/Current Home Services No current home services  Social Drivers of Health Review SDOH reviewed no interventions necessary  Readmission risk has been reviewed Yes  Transition of care needs no transition of care needs at this time    "

## 2024-10-31 NOTE — Progress Notes (Signed)
" °  Subjective No acute events. Feeling quite well. No abdominal pain. No n/v. Walked after surgery. Foley out before rounds. +Flatus. No BM per se yet. No reported prolapse  Objective: Vital signs in last 24 hours: Temp:  [97.2 F (36.2 C)-98 F (36.7 C)] 97.8 F (36.6 C) (01/15 0538) Pulse Rate:  [50-67] 65 (01/15 0538) Resp:  [12-17] 17 (01/15 0538) BP: (96-123)/(65-87) 122/76 (01/15 0538) SpO2:  [95 %-100 %] 98 % (01/15 0538) Weight:  [78.5 kg] 78.5 kg (01/15 0715) Last BM Date : 10/29/24  Intake/Output from previous day: 01/14 0701 - 01/15 0700 In: 2110 [P.O.:1060; I.V.:950; IV Piggyback:100] Out: 1565 [Urine:1550; Blood:15] Intake/Output this shift: No intake/output data recorded.  Gen: NAD, comfortable CV: RRR Pulm: Normal work of breathing Abd: Soft, NT/ND.  Ext: SCDs in place  Lab Results: CBC  Recent Labs    10/31/24 0512  WBC 7.2  HGB 13.7  HCT 42.1  PLT 244   BMET Recent Labs    10/31/24 0512  NA 137  K 4.2  CL 106  CO2 25  GLUCOSE 120*  BUN 7  CREATININE 0.75  CALCIUM 10.0   PT/INR No results for input(s): LABPROT, INR in the last 72 hours. ABG No results for input(s): PHART, HCO3 in the last 72 hours.  Invalid input(s): PCO2, PO2  Studies/Results:  Anti-infectives: Anti-infectives (From admission, onward)    Start     Dose/Rate Route Frequency Ordered Stop   10/30/24 1400  neomycin  (MYCIFRADIN ) tablet 1,000 mg  Status:  Discontinued       Placed in And Linked Group   1,000 mg Oral 3 times per day 10/30/24 1010 10/30/24 1018   10/30/24 1400  metroNIDAZOLE  (FLAGYL ) tablet 1,000 mg  Status:  Discontinued       Placed in And Linked Group   1,000 mg Oral 3 times per day 10/30/24 1010 10/30/24 1018   10/30/24 1015  cefoTEtan  (CEFOTAN ) 2 g in sodium chloride  0.9 % 100 mL IVPB        2 g 200 mL/hr over 30 Minutes Intravenous On call to O.R. 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 If she is doing well later today, will plan potential discharge. We reviewed general postop expectations and things to wathc out for. Discussed increasing miralax  from daily to BID for the next 3 months, potentially indefinitely based on her bowel habits. I have updated her nurse on plan of care for today.   LOS: 1 day   Lonni Pizza, MD [facility] "

## 2024-11-01 ENCOUNTER — Ambulatory Visit: Payer: Self-pay | Admitting: Psychiatry

## 2024-11-01 ENCOUNTER — Ambulatory Visit: Admitting: Physical Therapy

## 2024-11-01 DIAGNOSIS — K623 Rectal prolapse: Secondary | ICD-10-CM

## 2024-11-01 DIAGNOSIS — F339 Major depressive disorder, recurrent, unspecified: Secondary | ICD-10-CM | POA: Diagnosis not present

## 2024-11-01 DIAGNOSIS — F422 Mixed obsessional thoughts and acts: Secondary | ICD-10-CM | POA: Diagnosis not present

## 2024-11-01 DIAGNOSIS — F4010 Social phobia, unspecified: Secondary | ICD-10-CM

## 2024-11-01 NOTE — Discharge Summary (Signed)
 Patient ID: COSANDRA PLOUFFE MRN: [id number] DOB/AGE: Feb 02, 1966 59 y.o.  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 Robotic suture rectopexy carried out with nice taught result at completion. Of note, she does have some enlarged vessels in her posterior pelvis including 1 or 2 sizable veins and a crossing artery.  No injury to these vessels.    Hospital Course:  She was admitted for observation postoperatively and recovered quite well. She was tolerating a regular diet, having regular bowel movements without prolapse and mobilizing well on her own. Pain well controlled. She was comfortable with and stable for discharge home. Procedure and findings were reviewed. Postoperative expectations were reviewed. Followup arranged. All questions answered. She expressed understanding and agreement with the plan.   Allergies as of 10/31/2024       Reactions   Dextromethorphan-bupropion  Er Other (See Comments)   Unknown    Hydrocodone  Itching   Sulfamethoxazole-trimethoprim Itching   Tilactase Other (See Comments)   Unknown    Bee Pollen Other (See Comments)   Unknown    Dust Mite Extract Other (See Comments)   Sneezing, watery eyes, runny nose   Hydrocodone -acetaminophen  Itching   Latex Itching   Other Other (See Comments)   PT IS  ALLERGIC TO CAT DANDER AND RAGWEED - Sneezing, watery eyes, runny nose   Pollen Extract Other (See Comments)   Sneezing, watery eyes, runny nose   Sulfamethoxazole Other (See Comments)   Unknown         Medication List     TAKE these medications    ADULT GUMMY PO Take 1 tablet by mouth in the morning.   Airsupra  90-80 MCG/ACT Aero Generic drug: Albuterol -Budesonide  Inhale 2 puffs into the lungs daily as needed (wheezing/sob).   Auvelity  45-105 MG Tbcr Generic drug: Dextromethorphan-buPROPion  ER TAKE 1 TABLET BY MOUTH IN THE MORNING AND IN THE EVENING   Azelastine -Fluticasone  137-50 MCG/ACT Susp Place 1 spray into both nostrils in the morning and at bedtime.   baclofen  10 MG tablet Commonly known as: LIORESAL  Take 10 mg by mouth at bedtime as needed for muscle spasms.   bisacodyl  5 MG EC tablet Commonly known as: DULCOLAX Take 5 mg by mouth daily.   buPROPion  150 MG 24 hr tablet Commonly known as: WELLBUTRIN  XL TAKE 1 TABLET (150 MG TOTAL) BY MOUTH IN THE MORNING   clomiPRAMINE  25 MG capsule Commonly known as: ANAFRANIL  TAKE 1 CAPSULE BY MOUTH AT BEDTIME.   cyclobenzaprine  5 MG tablet Commonly known as: FLEXERIL  Take 5 mg by mouth daily as needed for muscle spasms.   docusate sodium  100 MG capsule Commonly known as: COLACE Take 1 capsule (100 mg total) by mouth 2 (two) times daily.   doxycycline 100 MG capsule Commonly known as: VIBRAMYCIN Take 100 mg by mouth daily as needed.   EPINEPHrine  0.3 mg/0.3 mL Soaj injection Commonly known as: EPI-PEN as directed Injection prn systemic reactions   fexofenadine  180 MG tablet Commonly known as: ALLEGRA Take 180 mg by mouth daily.   fluvoxaMINE  100 MG tablet Commonly known as: LUVOX  TAKE 1 TABLET IN THE AM AND 3 TABLETS AT NIGHT   hydrocortisone  2.5 % rectal cream Commonly known as: ANUSOL -HC Place rectally 2 (two) times daily. x 7-14 days What changed:  how much to take when to take this reasons to  take this additional instructions   hydrocortisone  25 MG suppository Commonly known as: ANUSOL -HC Place 25 mg rectally daily as needed for hemorrhoids or anal itching.   ketotifen  0.025 % ophthalmic solution Commonly known as: ZADITOR  Place 1 drop into both eyes daily as needed (allergies).   LORazepam  1 MG tablet Commonly known as: ATIVAN  Take 1 tablet (1 mg total) by mouth every 6 (six) hours as needed for anxiety. What changed:  how much to take when to take this additional instructions   Lyllana  0.025 MG/24HR Generic drug: estradiol  Place 1 patch onto the skin 2 (two) times a week.   MAGNESIUM PO Take 1 Scoop by mouth daily.   meloxicam  15 MG tablet Commonly known as: MOBIC  Take 1 tablet (15 mg total) by mouth daily. What changed:  when to take this reasons to take this   mesalamine  1000 MG suppository Commonly known as: CANASA  Place 1 suppository (1,000 mg total) rectally 2 (two) times daily. What changed:  when to take this reasons to take this   metroNIDAZOLE  500 MG tablet Commonly known as: FLAGYL    neomycin  500 MG tablet Commonly known as: MYCIFRADIN    oxybutynin  10 MG 24 hr tablet Commonly known as: DITROPAN -XL Take 10 mg by mouth daily.   oxyCODONE  5 MG immediate release tablet Commonly known as: Roxicodone  Take 1 tablet (5 mg total) by mouth every 6 (six) hours as needed for up to 5 days (postop pain not controlled with tylenol /ibuprofen  first).   polyethylene glycol 17 g packet Commonly known as: MIRALAX  / GLYCOLAX  Take 17 g by mouth daily as needed for mild constipation. What changed: when to take this   progesterone  100 MG capsule Commonly known as: PROMETRIUM  Take 100 mg by mouth daily.   propranolol  ER 60 MG 24 hr capsule Commonly known as: INDERAL  LA TAKE 1 CAPSULE BY MOUTH EVERY DAY   psyllium 58.6 % powder Commonly known as: METAMUCIL Take 1 packet by mouth daily as needed (constipation).   Spravato  (84 MG Dose) 28 MG/DEVICE  Sopk Generic drug: Esketamine HCl (84 MG Dose) USE 3 SPRAYS IN EACH NOSTRIL ONCE A WEEK   SUMAtriptan  50 MG tablet Commonly known as: IMITREX  Take 50 mg by mouth every 2 (two) hours as needed for migraine or headache.   SUMAtriptan  100 MG tablet Commonly known as: IMITREX  TAKE 1 TAB EVERY 2 HOURS AS NEEDED FOR MIGRAINE. MAY REPEAT IN 2 HRS IF HEADACHE PERSISTS OR RECURS   temazepam  30 MG capsule Commonly known as: RESTORIL  TAKE 1 CAPSULE BY MOUTH AT BEDTIME AS NEEDED FOR SLEEP What changed: See the new instructions.   triamcinolone  cream 0.1 % Commonly known as: KENALOG  Apply 1 Application topically daily as needed (rosecea).   Tymlos 3120 MCG/1.56ML Sopn Generic drug: Abaloparatide Inject into the skin.   VITAMIN K2-VITAMIN D3 PO Take 1 Scoop by mouth daily.        Lonni HERO. Teresa, M.D. [facility]
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
## 2024-11-07 ENCOUNTER — Telehealth: Payer: Self-pay | Admitting: Psychiatry

## 2024-11-07 NOTE — Telephone Encounter (Signed)
 I show pt has a RF available. Called pharmacy and they report she is going to use [facility] to get today, but it needs a PA, as they only allow 3 per day. I don't see anything in CMM, but: Prior Authorization Lorazepam  1 mg #150/30 [facility]   Approved Effective:  10/08/23-11/06/24        Dose now #120/30.

## 2024-11-07 NOTE — Telephone Encounter (Signed)
 Pt called at 2:30p requesting refill for Lorazepam  to   [facility] [address] [place], [place] - [address] [place], [place] [address] Phone: [phone]  Fax: [phone]   Next appt 2/19

## 2024-11-08 ENCOUNTER — Telehealth: Payer: Self-pay | Admitting: Psychiatry

## 2024-11-08 NOTE — Telephone Encounter (Signed)
 Pt notified of PA approval for lorazepam .

## 2024-11-08 NOTE — Telephone Encounter (Signed)
 The last Rx I see sent was December for #120, is it suppose to be #150? Is patient taking more than 3 a day? I see it states she is taking 2 tablets in the am and 1 in the pm?

## 2024-11-08 NOTE — Therapy (Incomplete)
 " OUTPATIENT PHYSICAL THERAPY NEURO TREATMENT   Patient Name: Casey Diaz MRN: [id number] DOB:[birth year], 59 y.o., female Today's Date: 11/08/2024   PCP: Onita Rush, MD  REFERRING PROVIDER: Nick Suzen Bucker, DO   END OF SESSION:       Past Medical History:  Diagnosis Date   Abnormal Pap smear 2011   hpv/mild dysplasia,cin1   Anxiety    Cancer (HCC)    skin- basal   Cerebral palsy (HCC)    right arm/leg   Cystocele    Depression    Headache    Neuromuscular disorder (HCC)    Cerebral Palsy   OCD (obsessive compulsive disorder)    Osteoporosis    Uterine prolaps    Past Surgical History:  Procedure Laterality Date   COLPOSCOPY  2011   ELBOW SURGERY     left elbow-separation of bones -age 52   ELBOW SURGERY  2015   fell on ice- right elbow fracture   FRACTURE SURGERY     HAMMER TOE SURGERY  1/13   RIGHT SIDE   HIP PINNING,CANNULATED Left 07/08/2016   Procedure: LEFT CLOSED REDUCTION HIP AND PERCUTANEOUS SCREW;  Surgeon: Donnice Car, MD;  Location: [facility];  Service: Orthopedics;  Laterality: Left;   ORIF HUMERUS FRACTURE Right 06/11/2021   Procedure: OPEN REDUCTION INTERNAL FIXATION (ORIF) DISTAL HUMERUS FRACTURE;  Surgeon: Celena Sharper, MD;  Location: [facility];  Service: Orthopedics;  Laterality: Right;   WRIST SURGERY  2005   left wrist   XI ROBOT ASSISTED RECTOPEXY N/A 10/30/2024   Procedure: RECTOPEXY, ROBOT-ASSISTED;  Surgeon: Teresa Lonni HERO, MD;  Location: [facility];  Service: General;  Laterality: N/A;   Patient Active Problem List   Diagnosis Date Noted   Rectal prolapse 10/30/2024   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HCC) 08/03/2018   Relationship problem with family member 08/03/2018   Closed fracture of neck of left femur, initial encounter (HCC) 07/08/2016   Severe recurrent major depression without psychotic features (HCC) 06/03/2015    Class: Chronic   Obsessive-compulsive disorder 06/03/2015     Class: Chronic    ONSET DATE: congenital   REFERRING DIAG: 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  Rationale for Evaluation and Treatment: Rehabilitation  SUBJECTIVE:  SUBJECTIVE STATEMENT: Continues to report some pain in L neck.  I do have a new brace (?AFO) and it is so bulky around my knee that it hurts my knee.  Plan to give a call to the person who made it.  No new falls.  Pt accompanied by: self  PERTINENT HISTORY: CP, anxiety, depression, HA, CPD, osteoporosis, L elbow and wrist surgery, L hip pinning, R humerus ORIF  PAIN:  Are you having pain? Yes: NPRS scale: 7/10 Pain location: L neck  Pain description: sharp Aggravating factors: fall Relieving factors: TENS, chiropractor   PRECAUTIONS: Fall, osteoporosis  RED FLAGS: None   WEIGHT BEARING RESTRICTIONS: No  FALLS: Has patient fallen in last 6 months? Yes. 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, and Leisure: personal training 2x/wk  PATIENT GOALS: address LB and neck pain   OBJECTIVE:     TODAY'S TREATMENT: 11/11/24 Activity Comments                        TODAY'S TREATMENT: 10/24/2024 Activity Comments  Gait training with cane-small rubber quad tip  Clinic distances, cues for sequence, holding in L hand versus R hand; gets off sequence after about 40 ft  Stair negotiation:  step through  pattern with rails -pt wants to work on (backwards) step down like getting off of treadmill   Multiple reps, cues for foot clearance, BUE support  Stepping up onto and off of treadmill  Simulating gym treadmill-pt typically comes off backwards and is fearful-discussed and practiced turning around and coming off of treadmill forwards facing for optimal balance and foot clearance  Review HEP Good return demo  Forward step ups 10 reps both legs leading   STM and manual TPR to L side of neck  Pt reported it feels much better, thank you soft tissue restriction in UT, LS, SCM, and scalenes. Reports decreased pain            PATIENT EDUCATION: Education details: cane sequence, practiced stairs and discussed safest ways to come off of treadmill at gym.  Discussed POC (pt in agreement to hold for upcoming surgical procedure and then return to PT) Person educated: Patient Education method: Explanation Education comprehension: verbalized understanding     HOME EXERCISE PROGRAM Last updated: 10/03/24 Access Code: [email or web address] URL: [email or web address] Date: 10/03/2024 Prepared by: [facility] - Outpatient  Rehab - [facility]  Exercises - Supine Lower Trunk Rotation  - 1 x daily - 5 x weekly - 2 sets - 10 reps - Supine Pelvic Tilt  - 1 x daily - 5 x weekly - 2 sets - 10 reps - Sidelying Thoracic Rotation with Open Book  - 1 x daily - 5 x weekly - 2 sets - 10 reps - Cervical Retraction with Overpressure  - 1 x daily - 5 x weekly - 2 sets - 10 reps - 3 sec hold   Note: Objective measures were completed at Evaluation unless otherwise noted.  DIAGNOSTIC FINDINGS: none recent  COGNITION: Overall cognitive status: Within functional limits for tasks assessed   SENSATION: Pt denies changes in N/T  COORDINATION: Alternating pronation/supination: motor limitations on R UE Alternating toe tap: motor limitations on R LE Finger to nose: motor limitations on R UE; mild tremor  on L   Observation: no swelling or bruising over R knee   MUSCLE TONE: slightly increased extensor tone in R quad; no knee pain with testing    POSTURE: rounded shoulders; head tilted slightly   LOWER EXTREMITY ROM:     Active  Right Eval Left Eval  Hip flexion    Hip extension    Hip abduction    Hip adduction    Hip internal rotation    Hip external rotation    Knee flexion    Knee extension    Ankle dorsiflexion 3 11  Ankle plantarflexion    Ankle inversion    Ankle eversion     (Blank rows = not tested)  LOWER EXTREMITY MMT:    MMT (in sitting) Right Eval Left Eval  Hip flexion 4 4+  Hip extension    Hip abduction 4- 4+  Hip adduction 4- 4+  Hip internal rotation    Hip external rotation    Knee flexion 4 4  Knee extension 4 5  Ankle dorsiflexion 3+ 5  Ankle plantarflexion 3+ 4+  Ankle inversion    Ankle eversion    (Blank rows = not tested) *no knee pain during testing   GAIT: Findings: Assistive device utilized:None, Level of assistance: Modified independence and SBA, and Comments: R knee adduction, lateral trunk lean, reduced R foot clearance with foot in eversion  FUNCTIONAL TESTS:  5 times sit to stand: 19.23 sec without UE support; retropulsion and limited descending control  10 meter walk test: 14.15 sec (2.32 ft/sec)                                                                                                                                TREATMENT DATE: 09/09/24     PATIENT EDUCATION: Education details: advised pt to make appt with PCP about R knee pain; advised pt that I was not aware of any CP support groups in the area unfortunately; prognosis, POC  Person educated: Patient Education method: Explanation Education comprehension: verbalized understanding  HOME EXERCISE PROGRAM: Not yet initiated   GOALS: Goals reviewed with patient? Yes  SHORT TERM GOALS: Target date: 09/30/2024  Patient to be independent with initial  HEP. Baseline: HEP initiated Goal status: INITIAL    LONG TERM GOALS: Target date: 10/28/2024  Patient to be independent with advanced HEP. Baseline: Not yet initiated  Goal status: INITIAL  Patient to recall 3 ways to manage neck and LBP. Baseline: edu not yet initiated Goal status: IN PROGRESS  Patient to demonstrate gait speed of 2.8 ft/sec in order to improve access to community.  Baseline: 2.32 ft/sec Goal status: IN PROGRESS  Patient to score at least 18/24 on DGI in order to decrease risk of falls.  Baseline: 14/24 10/08/24 Goal status: IN PROGRESS 10/08/24  Patient to demonstrate 5xSTS test in <15 sec in order to decrease risk of falls.  Baseline: 19.23 sec without UE support; retropulsion and  limited descending control  Goal status: IN PROGRESS  Patient to report understanding of fall prevention education. Baseline: not yet initiated Goal status: IN PROGRESS  ASSESSMENT:  CLINICAL IMPRESSION: Pt presents today with no additional falls to report.  She does note continued L neck pain and tightness, which is lessened/relieved with soft tissue work. Skilled PT session also focused on stair negotiation, step up exercises for functional strengthening, and gait training with cane. Pt needs cues for correct sequence with cane, as she has used her cane with RUE in the past (with RLE being the weaker side).  She initially gets the sequence, then gets off and she declines trying walking pole today; she will likely need additional practice on correct cane sequence.  Pt is due to have a surgical procedure next week, and pt would like to resume when she is cleared by MD; will recert when she returns.   Pt will continue to benefit from skilled PT towards goals for improved functional mobility and decreased fall risk.   OBJECTIVE IMPAIRMENTS: Abnormal gait, decreased activity tolerance, decreased balance, decreased coordination, decreased knowledge of use of DME, decreased mobility,  difficulty walking, decreased ROM, decreased strength, impaired flexibility, impaired tone, postural dysfunction, and pain.   ACTIVITY LIMITATIONS: carrying, lifting, bending, standing, squatting, stairs, transfers, bathing, toileting, dressing, reach over head, hygiene/grooming, locomotion level, and caring for others  PARTICIPATION LIMITATIONS: meal prep, cleaning, laundry, driving, shopping, community activity, yard work, and church  PERSONAL FACTORS: Age, Behavior pattern, Past/current experiences, Time since onset of injury/illness/exacerbation, and 3+ comorbidities: CP, anxiety, depression, HA, CPD, osteoporosis, L elbow and wrist surgery, L hip pinning, R humerus ORIF are also affecting patient's functional outcome.   REHAB POTENTIAL: Good  CLINICAL DECISION MAKING: Evolving/moderate complexity  EVALUATION COMPLEXITY: Moderate  PLAN:  PT FREQUENCY: 1-2x/week  PT DURATION: 6 weeks  PLANNED INTERVENTIONS: 97164- PT Re-evaluation, 97110-Therapeutic exercises, 97530- Therapeutic activity, [id number]- Neuromuscular re-education, 97535- Self Care, 02859- Manual therapy, [id number]- Gait training, [phone]- Canalith repositioning, [id number]- Electrical stimulation (manual), 20560 (1-2 muscles), 20561 (3+ muscles)- Dry Needling, Patient/Family education, Balance training, Stair training, Taping, Joint mobilization, Spinal mobilization, Vestibular training, DME instructions, Cryotherapy, and Moist heat  PLAN FOR NEXT SESSION: Will need to recert when pt returns.  Gait training with [place], review and progress HEP to work on balance, add step ups to HEP.  BLE and core strength, lumbar mobility, cervical stability       "

## 2024-11-08 NOTE — Telephone Encounter (Signed)
 PA APPROVED with [facility] Lorazepam  1 mg #120/30 day 10/17/24-11/08/25 [id number]

## 2024-11-08 NOTE — Telephone Encounter (Signed)
 Pt has RF available. LF 12/19, new Rx sent 12/29.

## 2024-11-08 NOTE — Telephone Encounter (Signed)
 Next appt is 12/05/24. Casey Diaz has 4 Temazepam  left. She is requesting a refill on it. Pharmacy is:  [id number] - [place], [place] - [address]

## 2024-11-11 ENCOUNTER — Ambulatory Visit: Admitting: Physical Therapy

## 2024-11-14 ENCOUNTER — Ambulatory Visit: Admitting: Psychiatry

## 2024-11-14 DIAGNOSIS — Z638 Other specified problems related to primary support group: Secondary | ICD-10-CM

## 2024-11-14 DIAGNOSIS — F422 Mixed obsessional thoughts and acts: Secondary | ICD-10-CM

## 2024-11-14 DIAGNOSIS — F339 Major depressive disorder, recurrent, unspecified: Secondary | ICD-10-CM | POA: Diagnosis not present

## 2024-11-14 DIAGNOSIS — F4010 Social phobia, unspecified: Secondary | ICD-10-CM

## 2024-11-14 NOTE — Addendum Note (Signed)
 Addended by: MARIJEAN HECK A on: 11/14/2024 01:32 PM   Modules accepted: Level of Service
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
## 2024-11-15 NOTE — Therapy (Incomplete)
 " OUTPATIENT PHYSICAL THERAPY NEURO TREATMENT   Patient Name: Casey Diaz MRN: [id number] DOB:Dec 09, 1965, 59 y.o., female Today's Date: 11/15/2024   PCP: Onita Rush, MD  REFERRING PROVIDER: Nick Suzen Bucker, DO   END OF SESSION:       Past Medical History:  Diagnosis Date   Abnormal Pap smear 2011   hpv/mild dysplasia,cin1   Anxiety    Cancer (HCC)    skin- basal   Cerebral palsy (HCC)    right arm/leg   Cystocele    Depression    Headache    Neuromuscular disorder (HCC)    Cerebral Palsy   OCD (obsessive compulsive disorder)    Osteoporosis    Uterine prolaps    Past Surgical History:  Procedure Laterality Date   COLPOSCOPY  2011   ELBOW SURGERY     left elbow-separation of bones -age 62   ELBOW SURGERY  2015   fell on ice- right elbow fracture   FRACTURE SURGERY     HAMMER TOE SURGERY  1/13   RIGHT SIDE   HIP PINNING,CANNULATED Left 07/08/2016   Procedure: LEFT CLOSED REDUCTION HIP AND PERCUTANEOUS SCREW;  Surgeon: Donnice Car, MD;  Location: [facility];  Service: Orthopedics;  Laterality: Left;   ORIF HUMERUS FRACTURE Right 06/11/2021   Procedure: OPEN REDUCTION INTERNAL FIXATION (ORIF) DISTAL HUMERUS FRACTURE;  Surgeon: Celena Sharper, MD;  Location: [facility];  Service: Orthopedics;  Laterality: Right;   WRIST SURGERY  2005   left wrist   XI ROBOT ASSISTED RECTOPEXY N/A 10/30/2024   Procedure: RECTOPEXY, ROBOT-ASSISTED;  Surgeon: Teresa Lonni HERO, MD;  Location: [facility];  Service: General;  Laterality: N/A;   Patient Active Problem List   Diagnosis Date Noted   Rectal prolapse 10/30/2024   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HCC) 08/03/2018   Relationship problem with family member 08/03/2018   Closed fracture of neck of left femur, initial encounter (HCC) 07/08/2016   Severe recurrent major depression without psychotic features (HCC) 06/03/2015    Class: Chronic   Obsessive-compulsive disorder 06/03/2015     Class: Chronic    ONSET DATE: congenital   REFERRING DIAG: 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  Rationale for Evaluation and Treatment: Rehabilitation  SUBJECTIVE:  SUBJECTIVE STATEMENT: Continues to report some pain in L neck.  I do have a new brace (?AFO) and it is so bulky around my knee that it hurts my knee.  Plan to give a call to the person who made it.  No new falls.  Pt accompanied by: self  PERTINENT HISTORY: CP, anxiety, depression, HA, CPD, osteoporosis, L elbow and wrist surgery, L hip pinning, R humerus ORIF  PAIN:  Are you having pain? Yes: NPRS scale: 7/10 Pain location: L neck  Pain description: sharp Aggravating factors: fall Relieving factors: TENS, chiropractor   PRECAUTIONS: Fall, osteoporosis  RED FLAGS: None   WEIGHT BEARING RESTRICTIONS: No  FALLS: Has patient fallen in last 6 months? Yes. 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, and Leisure: personal training 2x/wk  PATIENT GOALS: address LB and neck pain   OBJECTIVE:     TODAY'S TREATMENT: 11/18/24 Activity Comments                        TODAY'S TREATMENT: 10/24/2024 Activity Comments  Gait training with cane-small rubber quad tip  Clinic distances, cues for sequence, holding in L hand versus R hand; gets off sequence after about 40 ft  Stair negotiation:  step through pattern  with rails -pt wants to work on (backwards) step down like getting off of treadmill   Multiple reps, cues for foot clearance, BUE support  Stepping up onto and off of treadmill  Simulating gym treadmill-pt typically comes off backwards and is fearful-discussed and practiced turning around and coming off of treadmill forwards facing for optimal balance and foot clearance  Review HEP Good return demo  Forward step ups 10 reps both legs leading   STM and manual TPR to L side of neck  Pt reported it feels much better, thank you soft tissue restriction in UT, LS, SCM, and scalenes. Reports decreased pain            PATIENT EDUCATION: Education details: cane sequence, practiced stairs and discussed safest ways to come off of treadmill at gym.  Discussed POC (pt in agreement to hold for upcoming surgical procedure and then return to PT) Person educated: Patient Education method: Explanation Education comprehension: verbalized understanding     HOME EXERCISE PROGRAM Last updated: 10/03/24 Access Code: [email or web address] URL: [email or web address] Date: 10/03/2024 Prepared by: [facility] - Outpatient  Rehab - [facility]  Exercises - Supine Lower Trunk Rotation  - 1 x daily - 5 x weekly - 2 sets - 10 reps - Supine Pelvic Tilt  - 1 x daily - 5 x weekly - 2 sets - 10 reps - Sidelying Thoracic Rotation with Open Book  - 1 x daily - 5 x weekly - 2 sets - 10 reps - Cervical Retraction with Overpressure  - 1 x daily - 5 x weekly - 2 sets - 10 reps - 3 sec hold   Note: Objective measures were completed at Evaluation unless otherwise noted.  DIAGNOSTIC FINDINGS: none recent  COGNITION: Overall cognitive status: Within functional limits for tasks assessed   SENSATION: Pt denies changes in N/T  COORDINATION: Alternating pronation/supination: motor limitations on R UE Alternating toe tap: motor limitations on R LE Finger to nose: motor limitations on R UE; mild tremor on  L    Observation: no swelling or bruising over R knee   MUSCLE TONE: slightly increased extensor tone in R quad; no knee pain with testing    POSTURE: rounded shoulders; head tilted slightly   LOWER EXTREMITY ROM:     Active  Right Eval Left Eval  Hip flexion    Hip extension    Hip abduction    Hip adduction    Hip internal rotation    Hip external rotation    Knee flexion    Knee extension    Ankle dorsiflexion 3 11  Ankle plantarflexion    Ankle inversion    Ankle eversion     (Blank rows = not tested)  LOWER EXTREMITY MMT:    MMT (in sitting) Right Eval Left Eval  Hip flexion 4 4+  Hip extension    Hip abduction 4- 4+  Hip adduction 4- 4+  Hip internal rotation    Hip external rotation    Knee flexion 4 4  Knee extension 4 5  Ankle dorsiflexion 3+ 5  Ankle plantarflexion 3+ 4+  Ankle inversion    Ankle eversion    (Blank rows = not tested) *no knee pain during testing   GAIT: Findings: Assistive device utilized:None, Level of assistance: Modified independence and SBA, and Comments: R knee adduction, lateral trunk lean, reduced R foot clearance with foot in eversion  FUNCTIONAL TESTS:  5 times sit to stand: 19.23 sec without UE support; retropulsion and limited descending control  10 meter walk test: 14.15 sec (2.32 ft/sec)                                                                                                                                TREATMENT DATE: 09/09/24     PATIENT EDUCATION: Education details: advised pt to make appt with PCP about R knee pain; advised pt that I was not aware of any CP support groups in the area unfortunately; prognosis, POC  Person educated: Patient Education method: Explanation Education comprehension: verbalized understanding  HOME EXERCISE PROGRAM: Not yet initiated   GOALS: Goals reviewed with patient? Yes  SHORT TERM GOALS: Target date: 09/30/2024  Patient to be independent with initial  HEP. Baseline: HEP initiated Goal status: INITIAL    LONG TERM GOALS: Target date: 10/28/2024  Patient to be independent with advanced HEP. Baseline: Not yet initiated  Goal status: INITIAL  Patient to recall 3 ways to manage neck and LBP. Baseline: edu not yet initiated Goal status: IN PROGRESS  Patient to demonstrate gait speed of 2.8 ft/sec in order to improve access to community.  Baseline: 2.32 ft/sec Goal status: IN PROGRESS  Patient to score at least 18/24 on DGI in order to decrease risk of falls.  Baseline: 14/24 10/08/24 Goal status: IN PROGRESS 10/08/24  Patient to demonstrate 5xSTS test in <15 sec in order to decrease risk of falls.  Baseline: 19.23 sec without UE support; retropulsion and  limited descending control  Goal status: IN PROGRESS  Patient to report understanding of fall prevention education. Baseline: not yet initiated Goal status: IN PROGRESS  ASSESSMENT:  CLINICAL IMPRESSION: Pt presents today with no additional falls to report.  She does note continued L neck pain and tightness, which is lessened/relieved with soft tissue work. Skilled PT session also focused on stair negotiation, step up exercises for functional strengthening, and gait training with cane. Pt needs cues for correct sequence with cane, as she has used her cane with RUE in the past (with RLE being the weaker side).  She initially gets the sequence, then gets off and she declines trying walking pole today; she will likely need additional practice on correct cane sequence.  Pt is due to have a surgical procedure next week, and pt would like to resume when she is cleared by MD; will recert when she returns.   Pt will continue to benefit from skilled PT towards goals for improved functional mobility and decreased fall risk.   OBJECTIVE IMPAIRMENTS: Abnormal gait, decreased activity tolerance, decreased balance, decreased coordination, decreased knowledge of use of DME, decreased mobility,  difficulty walking, decreased ROM, decreased strength, impaired flexibility, impaired tone, postural dysfunction, and pain.   ACTIVITY LIMITATIONS: carrying, lifting, bending, standing, squatting, stairs, transfers, bathing, toileting, dressing, reach over head, hygiene/grooming, locomotion level, and caring for others  PARTICIPATION LIMITATIONS: meal prep, cleaning, laundry, driving, shopping, community activity, yard work, and church  PERSONAL FACTORS: Age, Behavior pattern, Past/current experiences, Time since onset of injury/illness/exacerbation, and 3+ comorbidities: CP, anxiety, depression, HA, CPD, osteoporosis, L elbow and wrist surgery, L hip pinning, R humerus ORIF are also affecting patient's functional outcome.   REHAB POTENTIAL: Good  CLINICAL DECISION MAKING: Evolving/moderate complexity  EVALUATION COMPLEXITY: Moderate  PLAN:  PT FREQUENCY: 1-2x/week  PT DURATION: 6 weeks  PLANNED INTERVENTIONS: 97164- PT Re-evaluation, 97110-Therapeutic exercises, 97530- Therapeutic activity, [id number]- Neuromuscular re-education, 97535- Self Care, [id number]- Manual therapy, [id number]- Gait training, [phone]- Canalith repositioning, [id number]- Electrical stimulation (manual), 20560 (1-2 muscles), 20561 (3+ muscles)- Dry Needling, Patient/Family education, Balance training, Stair training, Taping, Joint mobilization, Spinal mobilization, Vestibular training, DME instructions, Cryotherapy, and Moist heat  PLAN FOR NEXT SESSION: Will need to recert when pt returns.  Gait training with [place], review and progress HEP to work on balance, add step ups to HEP.  BLE and core strength, lumbar mobility, cervical stability       "

## 2024-11-18 ENCOUNTER — Ambulatory Visit: Admitting: Physical Therapy

## 2024-11-19 ENCOUNTER — Other Ambulatory Visit: Payer: Self-pay | Admitting: Psychiatry

## 2024-12-02 ENCOUNTER — Ambulatory Visit: Admitting: Physical Therapy

## 2024-12-05 ENCOUNTER — Ambulatory Visit: Admitting: Psychiatry

## 2024-12-12 ENCOUNTER — Ambulatory Visit: Admitting: Psychiatry
